# Patient Record
Sex: Male | Born: 1935
Health system: Southern US, Community
[De-identification: ages and names within clinical notes are randomized; demographics above are authoritative.]

## PROBLEM LIST (undated history)

## (undated) DIAGNOSIS — I350 Nonrheumatic aortic (valve) stenosis: Secondary | ICD-10-CM

## (undated) DIAGNOSIS — Z8719 Personal history of other diseases of the digestive system: Secondary | ICD-10-CM

## (undated) DIAGNOSIS — J189 Pneumonia, unspecified organism: Secondary | ICD-10-CM

## (undated) DIAGNOSIS — I739 Peripheral vascular disease, unspecified: Secondary | ICD-10-CM

## (undated) DIAGNOSIS — E119 Type 2 diabetes mellitus without complications: Secondary | ICD-10-CM

## (undated) DIAGNOSIS — J069 Acute upper respiratory infection, unspecified: Secondary | ICD-10-CM

## (undated) DIAGNOSIS — F32A Depression, unspecified: Secondary | ICD-10-CM

## (undated) DIAGNOSIS — T50905A Adverse effect of unspecified drugs, medicaments and biological substances, initial encounter: Secondary | ICD-10-CM

## (undated) DIAGNOSIS — Z8711 Personal history of peptic ulcer disease: Secondary | ICD-10-CM

## (undated) DIAGNOSIS — C679 Malignant neoplasm of bladder, unspecified: Secondary | ICD-10-CM

## (undated) DIAGNOSIS — I5032 Chronic diastolic (congestive) heart failure: Secondary | ICD-10-CM

## (undated) DIAGNOSIS — J449 Chronic obstructive pulmonary disease, unspecified: Secondary | ICD-10-CM

## (undated) DIAGNOSIS — Z95820 Peripheral vascular angioplasty status with implants and grafts: Secondary | ICD-10-CM

## (undated) DIAGNOSIS — L03116 Cellulitis of left lower limb: Secondary | ICD-10-CM

## (undated) DIAGNOSIS — I482 Chronic atrial fibrillation, unspecified: Secondary | ICD-10-CM

## (undated) DIAGNOSIS — IMO0001 Reserved for inherently not codable concepts without codable children: Secondary | ICD-10-CM

## (undated) DIAGNOSIS — E1340 Other specified diabetes mellitus with diabetic neuropathy, unspecified: Secondary | ICD-10-CM

## (undated) DIAGNOSIS — I701 Atherosclerosis of renal artery: Secondary | ICD-10-CM

## (undated) DIAGNOSIS — Z5189 Encounter for other specified aftercare: Secondary | ICD-10-CM

## (undated) DIAGNOSIS — R0602 Shortness of breath: Secondary | ICD-10-CM

## (undated) DIAGNOSIS — F329 Major depressive disorder, single episode, unspecified: Secondary | ICD-10-CM

## (undated) DIAGNOSIS — D6959 Other secondary thrombocytopenia: Secondary | ICD-10-CM

## (undated) DIAGNOSIS — I10 Essential (primary) hypertension: Secondary | ICD-10-CM

## (undated) HISTORY — DX: Atherosclerosis of renal artery: I70.1

## (undated) HISTORY — DX: Essential (primary) hypertension: I10

## (undated) HISTORY — PX: CATARACT EXTRACTION W/ INTRAOCULAR LENS  IMPLANT, BILATERAL: SHX1307

## (undated) HISTORY — DX: Nonrheumatic aortic (valve) stenosis: I35.0

## (undated) HISTORY — DX: Adverse effect of unspecified drugs, medicaments and biological substances, initial encounter: D69.59

## (undated) HISTORY — DX: Chronic diastolic (congestive) heart failure: I50.32

## (undated) HISTORY — DX: Cellulitis of left lower limb: L03.116

## (undated) HISTORY — PX: TONSILLECTOMY AND ADENOIDECTOMY: SUR1326

## (undated) HISTORY — PX: FRACTURE SURGERY: SHX138

## (undated) HISTORY — PX: PERIPHERAL ARTERIAL STENT GRAFT: SHX2220

## (undated) HISTORY — DX: Chronic atrial fibrillation, unspecified: I48.20

## (undated) HISTORY — PX: CHOLECYSTECTOMY: SHX55

## (undated) HISTORY — DX: Other secondary thrombocytopenia: T50.905A

## (undated) HISTORY — DX: Other specified diabetes mellitus with diabetic neuropathy, unspecified: E13.40

---

## 2000-03-17 ENCOUNTER — Encounter: Payer: Self-pay | Admitting: Specialist

## 2000-03-17 ENCOUNTER — Encounter: Admission: RE | Admit: 2000-03-17 | Discharge: 2000-03-17 | Payer: Self-pay | Admitting: Specialist

## 2001-06-29 ENCOUNTER — Ambulatory Visit (HOSPITAL_COMMUNITY): Admission: RE | Admit: 2001-06-29 | Discharge: 2001-06-29 | Payer: Self-pay | Admitting: Specialist

## 2001-07-13 ENCOUNTER — Ambulatory Visit (HOSPITAL_COMMUNITY): Admission: RE | Admit: 2001-07-13 | Discharge: 2001-07-13 | Payer: Self-pay | Admitting: Specialist

## 2001-10-30 ENCOUNTER — Ambulatory Visit (HOSPITAL_COMMUNITY): Admission: RE | Admit: 2001-10-30 | Discharge: 2001-10-30 | Payer: Self-pay | Admitting: Internal Medicine

## 2002-01-18 ENCOUNTER — Ambulatory Visit (HOSPITAL_COMMUNITY): Admission: RE | Admit: 2002-01-18 | Discharge: 2002-01-18 | Payer: Self-pay | Admitting: *Deleted

## 2002-02-04 ENCOUNTER — Ambulatory Visit (HOSPITAL_COMMUNITY): Admission: RE | Admit: 2002-02-04 | Discharge: 2002-02-04 | Payer: Self-pay | Admitting: *Deleted

## 2002-03-24 ENCOUNTER — Ambulatory Visit (HOSPITAL_COMMUNITY): Admission: RE | Admit: 2002-03-24 | Discharge: 2002-03-25 | Payer: Self-pay | Admitting: *Deleted

## 2002-04-21 ENCOUNTER — Ambulatory Visit (HOSPITAL_COMMUNITY): Admission: RE | Admit: 2002-04-21 | Discharge: 2002-04-22 | Payer: Self-pay | Admitting: *Deleted

## 2003-07-26 ENCOUNTER — Emergency Department (HOSPITAL_COMMUNITY): Admission: EM | Admit: 2003-07-26 | Discharge: 2003-07-26 | Payer: Self-pay | Admitting: *Deleted

## 2004-02-26 DIAGNOSIS — I701 Atherosclerosis of renal artery: Secondary | ICD-10-CM

## 2004-02-26 HISTORY — PX: RENAL ARTERY STENT: SHX2321

## 2004-02-26 HISTORY — DX: Atherosclerosis of renal artery: I70.1

## 2006-01-15 ENCOUNTER — Encounter: Admission: RE | Admit: 2006-01-15 | Discharge: 2006-01-15 | Payer: Self-pay | Admitting: Specialist

## 2006-02-23 ENCOUNTER — Ambulatory Visit: Payer: Self-pay | Admitting: Internal Medicine

## 2006-03-13 LAB — COMPREHENSIVE METABOLIC PANEL
ALT: 26 U/L (ref 0–53)
BUN: 24 mg/dL — ABNORMAL HIGH (ref 6–23)
CO2: 30 mEq/L (ref 19–32)
Calcium: 9.6 mg/dL (ref 8.4–10.5)
Creatinine, Ser: 1.17 mg/dL (ref 0.40–1.50)
Total Bilirubin: 0.8 mg/dL (ref 0.3–1.2)

## 2006-03-13 LAB — IRON AND TIBC
%SAT: 28 % (ref 20–55)
Iron: 63 ug/dL (ref 42–165)
TIBC: 222 ug/dL (ref 215–435)
UIBC: 159 ug/dL

## 2006-03-13 LAB — CBC WITH DIFFERENTIAL/PLATELET
BASO%: 0.8 % (ref 0.0–2.0)
Basophils Absolute: 0.1 10*3/uL (ref 0.0–0.1)
EOS%: 2.8 % (ref 0.0–7.0)
LYMPH%: 22.7 % (ref 14.0–48.0)
MCV: 95.4 fL (ref 81.6–98.0)
MONO#: 0.8 10*3/uL (ref 0.1–0.9)
NEUT#: 5.3 10*3/uL (ref 1.5–6.5)
NEUT%: 64.5 % (ref 40.0–75.0)
RBC: 4.17 10*6/uL — ABNORMAL LOW (ref 4.20–5.71)
RDW: 12.4 % (ref 11.2–14.6)
WBC: 8.3 10*3/uL (ref 4.0–10.0)

## 2006-03-13 LAB — LACTATE DEHYDROGENASE: LDH: 137 U/L (ref 94–250)

## 2006-03-18 ENCOUNTER — Ambulatory Visit (HOSPITAL_COMMUNITY): Admission: RE | Admit: 2006-03-18 | Discharge: 2006-03-18 | Payer: Self-pay | Admitting: Internal Medicine

## 2006-03-27 LAB — CBC WITH DIFFERENTIAL/PLATELET
BASO%: 0.5 % (ref 0.0–2.0)
EOS%: 2.1 % (ref 0.0–7.0)
LYMPH%: 15.3 % (ref 14.0–48.0)
MCH: 33.7 pg — ABNORMAL HIGH (ref 28.0–33.4)
MCHC: 34.7 g/dL (ref 32.0–35.9)
MCV: 97.3 fL (ref 81.6–98.0)
MONO%: 9.3 % (ref 0.0–13.0)
NEUT#: 6.3 10*3/uL (ref 1.5–6.5)
Platelets: 95 10*3/uL — ABNORMAL LOW (ref 145–400)
RBC: 4.06 10*6/uL — ABNORMAL LOW (ref 4.20–5.71)
RDW: 14.9 % — ABNORMAL HIGH (ref 11.2–14.6)

## 2006-08-07 ENCOUNTER — Ambulatory Visit: Payer: Self-pay | Admitting: Gastroenterology

## 2006-08-18 ENCOUNTER — Ambulatory Visit: Payer: Self-pay | Admitting: Gastroenterology

## 2006-08-20 ENCOUNTER — Ambulatory Visit: Payer: Self-pay | Admitting: Gastroenterology

## 2006-11-04 ENCOUNTER — Ambulatory Visit (HOSPITAL_COMMUNITY): Admission: RE | Admit: 2006-11-04 | Discharge: 2006-11-04 | Payer: Self-pay | Admitting: Internal Medicine

## 2007-07-11 DIAGNOSIS — N281 Cyst of kidney, acquired: Secondary | ICD-10-CM | POA: Insufficient documentation

## 2007-07-11 DIAGNOSIS — K5730 Diverticulosis of large intestine without perforation or abscess without bleeding: Secondary | ICD-10-CM | POA: Insufficient documentation

## 2007-07-11 DIAGNOSIS — I10 Essential (primary) hypertension: Secondary | ICD-10-CM

## 2007-07-11 DIAGNOSIS — E118 Type 2 diabetes mellitus with unspecified complications: Secondary | ICD-10-CM | POA: Insufficient documentation

## 2007-07-11 DIAGNOSIS — E785 Hyperlipidemia, unspecified: Secondary | ICD-10-CM | POA: Insufficient documentation

## 2007-07-11 DIAGNOSIS — G2581 Restless legs syndrome: Secondary | ICD-10-CM | POA: Insufficient documentation

## 2007-07-11 DIAGNOSIS — M129 Arthropathy, unspecified: Secondary | ICD-10-CM | POA: Insufficient documentation

## 2007-07-11 DIAGNOSIS — Z85828 Personal history of other malignant neoplasm of skin: Secondary | ICD-10-CM

## 2007-07-11 DIAGNOSIS — Z8679 Personal history of other diseases of the circulatory system: Secondary | ICD-10-CM

## 2007-07-11 DIAGNOSIS — F329 Major depressive disorder, single episode, unspecified: Secondary | ICD-10-CM

## 2007-07-11 DIAGNOSIS — I739 Peripheral vascular disease, unspecified: Secondary | ICD-10-CM

## 2007-08-10 ENCOUNTER — Encounter: Admission: RE | Admit: 2007-08-10 | Discharge: 2007-08-10 | Payer: Self-pay | Admitting: Internal Medicine

## 2007-11-26 ENCOUNTER — Encounter: Payer: Self-pay | Admitting: Cardiovascular Disease

## 2008-03-09 ENCOUNTER — Encounter: Payer: Self-pay | Admitting: Cardiovascular Disease

## 2008-05-18 ENCOUNTER — Ambulatory Visit: Payer: Self-pay | Admitting: Cardiovascular Disease

## 2008-05-26 ENCOUNTER — Telehealth (INDEPENDENT_AMBULATORY_CARE_PROVIDER_SITE_OTHER): Payer: Self-pay

## 2008-05-30 ENCOUNTER — Encounter: Payer: Self-pay | Admitting: Cardiovascular Disease

## 2008-05-30 ENCOUNTER — Ambulatory Visit: Payer: Self-pay

## 2008-05-30 ENCOUNTER — Encounter: Payer: Self-pay | Admitting: Cardiology

## 2008-05-31 DIAGNOSIS — R0989 Other specified symptoms and signs involving the circulatory and respiratory systems: Secondary | ICD-10-CM | POA: Insufficient documentation

## 2008-06-01 ENCOUNTER — Encounter: Payer: Self-pay | Admitting: Cardiovascular Disease

## 2008-06-01 ENCOUNTER — Ambulatory Visit: Payer: Self-pay | Admitting: Cardiovascular Disease

## 2008-06-13 ENCOUNTER — Encounter (INDEPENDENT_AMBULATORY_CARE_PROVIDER_SITE_OTHER): Payer: Self-pay | Admitting: Urology

## 2008-06-13 ENCOUNTER — Ambulatory Visit (HOSPITAL_COMMUNITY): Admission: RE | Admit: 2008-06-13 | Discharge: 2008-06-13 | Payer: Self-pay | Admitting: Urology

## 2008-10-12 ENCOUNTER — Ambulatory Visit: Payer: Self-pay | Admitting: Cardiovascular Disease

## 2008-10-19 ENCOUNTER — Ambulatory Visit: Payer: Self-pay

## 2008-10-19 ENCOUNTER — Encounter: Payer: Self-pay | Admitting: Cardiovascular Disease

## 2008-10-20 ENCOUNTER — Ambulatory Visit: Payer: Self-pay | Admitting: Cardiovascular Disease

## 2009-04-11 ENCOUNTER — Telehealth (INDEPENDENT_AMBULATORY_CARE_PROVIDER_SITE_OTHER): Payer: Self-pay | Admitting: *Deleted

## 2009-05-01 ENCOUNTER — Ambulatory Visit: Payer: Self-pay | Admitting: Cardiovascular Disease

## 2009-06-13 ENCOUNTER — Encounter: Payer: Self-pay | Admitting: Cardiovascular Disease

## 2009-06-26 ENCOUNTER — Encounter: Payer: Self-pay | Admitting: Cardiovascular Disease

## 2009-07-05 ENCOUNTER — Encounter: Payer: Self-pay | Admitting: Cardiovascular Disease

## 2009-07-05 ENCOUNTER — Ambulatory Visit: Payer: Self-pay

## 2010-02-09 ENCOUNTER — Ambulatory Visit: Payer: Self-pay | Admitting: Internal Medicine

## 2010-02-12 LAB — CBC WITH DIFFERENTIAL/PLATELET
Eosinophils Absolute: 0.2 10*3/uL (ref 0.0–0.5)
HCT: 39.1 % (ref 38.4–49.9)
LYMPH%: 19.8 % (ref 14.0–49.0)
MCHC: 34.4 g/dL (ref 32.0–36.0)
MCV: 95.8 fL (ref 79.3–98.0)
MONO#: 0.7 10*3/uL (ref 0.1–0.9)
MONO%: 8.5 % (ref 0.0–14.0)
NEUT%: 69.6 % (ref 39.0–75.0)
Platelets: 114 10*3/uL — ABNORMAL LOW (ref 140–400)
RBC: 4.08 10*6/uL — ABNORMAL LOW (ref 4.20–5.82)
WBC: 8.6 10*3/uL (ref 4.0–10.3)

## 2010-02-12 LAB — LACTATE DEHYDROGENASE: LDH: 134 U/L (ref 94–250)

## 2010-02-12 LAB — COMPREHENSIVE METABOLIC PANEL
Alkaline Phosphatase: 58 U/L (ref 39–117)
CO2: 29 mEq/L (ref 19–32)
Creatinine, Ser: 1.45 mg/dL (ref 0.40–1.50)
Glucose, Bld: 214 mg/dL — ABNORMAL HIGH (ref 70–99)
Sodium: 145 mEq/L (ref 135–145)
Total Bilirubin: 0.6 mg/dL (ref 0.3–1.2)

## 2010-03-29 NOTE — Miscellaneous (Signed)
Summary: Orders Update  Clinical Lists Changes  Orders: Added new Test order of Carotid Duplex (Carotid Duplex) - Signed 

## 2010-03-29 NOTE — Assessment & Plan Note (Signed)
Summary: f81m   Visit Type:  6 months follow up Referring Provider:  Maurine Cane, MD Primary Provider:  Creola Corn, MD  CC:  Bilateral leg pain.  History of Present Illness: 77 year-old male with lower extremity PAD presents for follow-up evaluation of lower extremity PAD and leg pain. He has known hx of lower extremity PAD and previous PTA/stenting of the bilateral SFA's.The patient has leg weakness, unsteadiness, and pain with ambulation. He does not have much calf pain. Instability and weakness are major complaints.  He had a lower extremity duplex scan 8/25 that showed ABI's of 0.5 bilaterally with multiple severe stenoses in both SFAs.  He continues to smoke cigarettes dating back to when he was very young.  Current Medications (verified): 1)  Kls Allerclear 10 Mg Tabs (Loratadine) .Marland Kitchen.. 1 Tab By Mouth Once Daily 2)  Amlodipine Besylate 10 Mg Tabs (Amlodipine Besylate) .Marland Kitchen.. 1 Tab By Mouth Once Daily 3)  Benazepril-Hydrochlorothiazide 20-12.5 Mg Tabs (Benazepril-Hydrochlorothiazide) .... 2 Tabs By Mouth Once Daily 4)  Celebrex 200 Mg Caps (Celecoxib) .Marland Kitchen.. 1 Tab As Needed 5)  Finasteride 5 Mg Tabs (Finasteride) .Marland Kitchen.. 1 Tab By Mouth Once Daily 6)  Glyburide-Metformin 5-500 Mg Tabs (Glyburide-Metformin) .... 1/2  Tab By Mouth Two Times A Day 7)  Klor-Con M20 20 Meq Cr-Tabs (Potassium Chloride Crys Cr) .Marland Kitchen.. 1 Tab By Mouth Once Daily 8)  Plavix 75 Mg Tabs (Clopidogrel Bisulfate) .Marland Kitchen.. 1 Tab By Mouth Qd 9)  Simvastatin 20 Mg Tabs (Simvastatin) .Marland Kitchen.. 1 Tab By Mouth Once Daily 10)  Doxazosin Mesylate 4 Mg Tabs (Doxazosin Mesylate) .... Take 1 Tablet By Mouth Once A Day 11)  Furosemide 20 Mg Tabs (Furosemide) .... Take One Tablet By Mouth Daily. 12)  Metoprolol Dose ? Marland Kitchen... Take 1 Tablet By Mouth Once A Day  Allergies: 1)  ! Jonne Ply  Past History:  Past medical history reviewed for relevance to current acute and chronic problems.  Past Medical History: Reviewed history from 10/20/2008 and no  changes required. Current Problems:  PVD (ICD-443.9) - PAD with hx of SFA stenting 2006 CAD (ICD-414.00)  HYPERTENSION (ICD-401.9) HYPERLIPIDEMIA (ICD-272.4) MURMUR (ICD-785.2) SKIN CANCER, HX OF (ICD-V10.83) RENAL CYST (ICD-593.2) DIVERTICULOSIS, COLON (ICD-562.10) RESTLESS LEGS SYNDROME (ICD-333.94) RHEUMATIC HEART DISEASE, HX OF (ICD-V12.59) DEPRESSION (ICD-311) ARTHRITIS (ICD-716.90) DIABETES MELLITUS, TYPE II (ICD-250.00) RENAL ARTERY STENOSIS, S/P RENAL PTA  Vital Signs:  Patient profile:   77 year old male Height:      66 inches Weight:      197.25 pounds Pulse rate:   56 / minute Pulse rhythm:   regular Resp:     18 per minute BP sitting:   144 / 64  (left arm) Cuff size:   large  Vitals Entered By: Vikki Ports (May 01, 2009 12:25 PM) CC: Bilateral leg pain Comments Pt. states Dr. Ty Hilts D/C Atenolol and prescibed Metoprolol isntead. Pt. does not remember med dose   Serial Vital Signs/Assessments:  Time      Position  BP       Pulse  Resp  Temp     By           R Arm     140/60                         Vikki Ports   Physical Exam  General:  Pt is alert and oriented, elderly male, in no acute distress. HEENT: normal Neck: normal carotid upstrokes with bilateral bruits, JVP  normal Lungs: CTA CV: RRR with 2/6 systolic murmur LSB Abd: soft, NT, positive BS, no bruit, no organomegaly Ext: no clubbing, cyanosis, or edema.  pedal pulses not palpable    Impression & Recommendations:  Problem # 1:  PVD (ICD-443.9) Pt has stable intermittent claudication. His leg problems are multifactorial. I reviewed the duplex ultrasound findings with him, and this shows diffuse bilateral SFA stenosis. Based on unfavorable leg anatomy, advanced age, chronic kidney disease, continued tobacco abuse, and the presence of other leg problems, this pt is better off with continued medical therapy for his PAD. We discussed this in detail today. He does not have resting limb  ischemia. I will plan on seeing him back on an as needed basis as he follows with Dr Eden Emms for his cardiac management. He was counseled regarding tobacco cessation, but he doesn't seem inclined to quit at this point.  Patient Instructions: 1)  Your physician recommends that you schedule a follow-up appointment as needed.  2)  Your physician recommends that you continue on your current medications as directed. Please refer to the Current Medication list given to you today.

## 2010-03-29 NOTE — Progress Notes (Signed)
  Phone Note From Other Clinic   Caller: Valerie/[facility]. Details for Reason: pt.Information Initial call taken by: Joice Lofts Doppler,Echo over to [phone] [facility]  April 11, 2009 2:05 PM

## 2010-04-03 ENCOUNTER — Other Ambulatory Visit: Payer: Self-pay | Admitting: Dermatology

## 2010-06-06 LAB — GLUCOSE, CAPILLARY: Glucose-Capillary: 88 mg/dL (ref 70–99)

## 2010-06-06 LAB — CBC
HCT: 38.7 % — ABNORMAL LOW (ref 39.0–52.0)
Hemoglobin: 13.1 g/dL (ref 13.0–17.0)
MCHC: 34 g/dL (ref 30.0–36.0)
MCV: 98.7 fL (ref 78.0–100.0)
Platelets: 116 10*3/uL — ABNORMAL LOW (ref 150–400)
RBC: 3.92 MIL/uL — ABNORMAL LOW (ref 4.22–5.81)
RDW: 14.5 % (ref 11.5–15.5)
WBC: 10.2 10*3/uL (ref 4.0–10.5)

## 2010-06-06 LAB — BASIC METABOLIC PANEL
BUN: 16 mg/dL (ref 6–23)
CO2: 31 mEq/L (ref 19–32)
Calcium: 9.9 mg/dL (ref 8.4–10.5)
Chloride: 106 mEq/L (ref 96–112)
Creatinine, Ser: 1.23 mg/dL (ref 0.4–1.5)
GFR calc Af Amer: >60 mL/min (ref >60)
GFR calc non Af Amer: 56 mL/min — ABNORMAL LOW (ref >60)
Glucose, Bld: 123 mg/dL — ABNORMAL HIGH (ref 70–99)
Potassium: 5.3 mEq/L — ABNORMAL HIGH (ref 3.5–5.1)
Sodium: 146 mEq/L — ABNORMAL HIGH (ref 135–145)

## 2010-07-10 NOTE — Assessment & Plan Note (Signed)
[facility]                         GASTROENTEROLOGY OFFICE NOTE   DAVARIS, Ronald                       MRN:          [id number]  DATE:08/07/2006                            DOB:          June 16, 1922    [name] is an 77 year old white male with severe coronary artery and  peripheral vascular disease. He has a strong family history of colon  cancer in his brother who died at age 29. He has had recurrent colon  polyps with the last colonoscopy 3 years ago, removable of multiple  edematous polyps. He is scheduled for followup exam at this time.   [name] denies any GI complaints except for some mild  constipation. He denies rectal bleeding, any upper GI, or any  hepatobiliary complaints. He apparently had an examination earlier this  year by Dr. Arbutus Ped for possible hepatosplenomegaly and underwent upper  abdominal ultrasonography which showed a borderline hepatomegaly,  gallbladder polyps, and a right renal minimally complex cyst. I really  do not have notes from that encounter.   [name] apparently has never had hepatitis or known liver disease.  He uses alcohol, amounts of which are unclear. He also smokes rather  heavily. I can not see in his chart where he has ever had abnormal liver  function tests, going back for many years.   PAST MEDICAL HISTORY:  Remarkable for adult onset diabetes, hypertensive  cardiovascular disease, peripheral vascular disease with peripheral  vascular stents by Dr. Madilyn Fireman and apparently cardiac stents by Dr.  Yevonne Pax group. In addition he suffers from degenerative arthritis  and chronic depression.   MEDICATIONS:  1. Actos 30 mg a day.  2. Benazepril 25 mg a day.  3. Celebrex 200 mg a day.  4. Doxazosin 8 mg  a day.  5. Finasteride 5 mg a day.  6. Glyburide 5/500 one and a half a day.  7. Potassium chloride 20 mEq a day.  8. Metaprel 100 mg a day.  9. Plavix 75 mg a day.  10.Simvastatin 20 mg  a day.  11.Vesicare 10 mg a day.  12.Mirapex at bedtime.   He apparently in the past has had reactions to salicylates.   FAMILY HISTORY:  Remarkable for his brother as mentioned above. His  mother had diabetes and arthrosclerotic peripheral vascular disease.   SOCIAL HISTORY:  The patient is married and lives with his wife and has  a Holiday representative. He works as a Firefighter. He does  smoke and uses ethanol socially.   REVIEW OF SYSTEMS:  Remarkably positive for multiple complaints  including peripheral edema, non productive cough, frequent urination,  arthralgias and myalgias, recurrent nose bleeds, severe fatigue,  deafness, shortness of breath exertion, etc.   He is an elderly but healthy appearing white male in no distress. He is  6 feet tall and weighs 189 pounds. Blood pressure is 140/68 and pulse  was 64 and regular.  He did appear to have spider telangiectasiaover his facial area.  CHEST: Generally showed diminished breath sounds but there were no  wheezes or rhonchi. He had a  very loud 2 to 3 over 6 systolic ejection  honking murmur at the base of the heart radiating into the carotid  areas. I could not appreciate an S3 gallop or irregularity.  There was no hepatosplenomegaly, abdominal masses, or tenderness on  exam. Bowel sounds were normal.  Mental status was clear.  There was no peripheral edema or phlebitis.  RECTAL EXAM: Deferred.   ASSESSMENT:  1. Family history of colon cancer at an early age in an 77 year old      white male with recurrent colon polyps. He needs follow up exam      despite his multiple comorbid medical problems and increased risk      of this procedure.  2. Peripheral and coronary artery vascular disease with stents      peripherally and in his coronary arteries, currently on Plavix      therapy.  3. Chronic obstructive lung disease from cigarette abuse.  4. Adult onset diabetes mellitus.  5. History of hyperlipidemia.  6.  History of what sounds like restless leg syndrome with Mirapex use.  7. History of possible occult liver disease from chronic ethanol use.   RECOMMENDATIONS:  I have gone ahead and set Mr. Handshoe up for  colonoscopy with close monitorization. I will not stop his Plavix but  will make adjustments in his diabetic medications. He is to continue all  of his other medications as listed above per doctors Timothy Lasso and  [place].     Vania Rea. Jarold Motto, MD, Caleen Essex, FAGA  Electronically Signed    DRP/MedQ  DD: 08/07/2006  DT: 08/07/2006  Job #: [id number]   cc:   [name], MD  Cassell Clement, M.D.

## 2010-07-10 NOTE — Op Note (Signed)
NAME:  Ronald, [name] NO.:  [id number]   MEDICAL RECORD NO.:  [id number]          PATIENT TYPE:  AMB   LOCATION:  DAY                          FACILITY:  [facility]   PHYSICIAN:  Mark C. Vernie Ammons, M.D.  DATE OF BIRTH:  04/23/1922   DATE OF PROCEDURE:  06/13/2008  DATE OF DISCHARGE:                               OPERATIVE REPORT   PREOPERATIVE DIAGNOSIS:  Bladder lesion - rule out bladder carcinoma.   POSTOPERATIVE DIAGNOSIS:  Bladder lesion - rule out bladder carcinoma.   PROCEDURE:  Cystoscopy with transurethral resection of bladder lesion  and random bladder biopsies.   SURGEON:  Raylene Miyamoto, M.D.   ANESTHESIA:  MAC.   SPECIMENS:  1. Cold cup biopsy of bladder lesion.  2. Cold cup biopsy of posterior wall of bladder.  3. Cold cup biopsy of left wall bladder.  (All sent to pathology)   BLOOD LOSS:  Minimal.   DRAINS:  None.   COMPLICATIONS:  None.   INDICATIONS:  The patient is an 77 year old male who was found to have  microscopic hematuria on a routine office visit.  His upper tracts were  evaluated with CT scan and no abnormality to account for this was  identified.  He underwent cystoscopy in my office and I found an area on  the posterior wall of the bladder on the right-hand side that was  worrisome for early transitional cell carcinoma of the bladder.  I  discussed the need to evaluate this further with biopsy and resection of  the area as well as random bladder biopsies with the patient.  He  understands and has elected to proceed.   DESCRIPTION OF OPERATION:  After informed consent, the patient was  brought to the major OR, placed on the table and administered  intravenous sedation.  I initially sounded his urethral meatus and found  it to be slightly narrow and unlikely to accept the 28-French  resectoscope so I dilated up to 28-French with the [employer] sounds.  I  then chose a 24-French resectoscope sheath and passed this with the  Timberlake obturator into the bladder.   The 20 degree lens was placed on the resectoscope element and then  inserted through the resectoscope sheath into the bladder.  I did a full  systematic inspection of the bladder and noted trabeculation.  The  ureteral orifices were noted to be of normal configuration and position.  The lesion that was slightly reddened and appeared to have an early  papillary configuration was identified again on the posterior right wall  of the bladder, well behind the right ureteral orifice.  There was some  slight erythematous change to the bladder wall just lateral to that as  well.  No other lesions were identified within the bladder or other  worrisome findings.   I initially started to resect the area but it was small enough that the  resection appeared to begin to obliterate the lesions so I stopped and  inserted the cold cup biopsy forceps and obtained a biopsy from that  area and also an area just to  the left of the lesion.  I then went on to  fulgurate this location completely and its surrounding mucosa as well as  lateral to that to include all of the slightly erythematous area of the  bladder wall.  I reinserted the cold cup biopsy forceps and then  obtained a biopsy from the posterior wall and left wall and fulgurated  these as well.  At the end of procedure, there was no evidence of  bleeding and the bladder was drained and the resectoscope was removed.  The patient did receive a B & O suppository preoperatively.  He was  taken to the recovery room in stable and satisfactory condition and  tolerated the procedure well with no intraoperative complications.   He will be given a prescription for Vicodin #18 and Pyridium 200 mg #24  and follow-up in my office in 1 week to discuss the results of his  pathology report.      Mark C. Vernie Ammons, M.D.  Electronically Signed     MCO/MEDQ  D:  06/13/2008  T:  06/13/2008  Job:  [id number]

## 2010-07-10 NOTE — Assessment & Plan Note (Signed)
[facility]                            CARDIOLOGY OFFICE NOTE   ZAYN, SELLEY                       MRN:          [id number]  DATE:05/18/2008                            DOB:          25-Dec-1922    An 77 year old patient referred for preop clearance.  Mr. Wieting is a  complicated patient.  Unfortunately, I do not have all of his records.  He was sent here by Dr. Retta Diones for preop clearance.  The patient may  have prostate cancer.  The patient has been seen by Dr. Ronny Flurry in  the past and it is not clear to me why he is now seeing [facility].  Talking to the patient, he actually minimizes a lot of  things.  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  He has an over 80-pack-year history of smoking and  continues to smoke 2 packs a day.  I do not have any PFTs on him.  He  does cough from time to time.  He tells me Dr. Timothy Lasso has done a chest x-  ray this past year, and at least so far, there has been no evidence of  cancer.  He denies  an active history of a diagnosis of COPD.  From a  cardiac perspective, he clearly has had some problems in the past, but  they do not appear to have been worked up recently.  He has not had a  recent stress test.  His coronary risk factors include heavy smoking,  diabetes, hypertension, hypercholesterolemia.   He is not having chest pain.  He walks on a regular basis.  He does not  appear to have claudication.   There is a distant history of some sort of murmur.  He belittles this.  The patient is on Plavix and it is not clear why, the patient certainly  does not know why.  He denies having a previous TIA or CVA.   The patient does drink alcohol.  He denies drinking every day but says  on most days, he has a beer or wine.  He does not think it would be a  problem to stop for a week or two.   He has seen Dr. Retta Diones and apparently had hematuria.  Followup  showed  that he needs a prostate surgery.   His review of systems is otherwise remarkable for some seasonal  allergies, constipation, fatigue.  He had a history of a stomach ulcer  in 1956, which is not active.  He has had urinary frequency, some sexual  dysfunction, arthritis.  His sugars have been fluctuating.  There is  also a question of previous claudication with stents in both legs and  kidneys.   His review of systems were all otherwise reviewed and negative.   FAMILY HISTORY:  Remarkable for mother dying at age 67 of an aneurysm  and father having a stroke at age 7.   ALLERGIES:  He is intolerant to ASPIRIN.   OTHER MEDICATIONS:  1. Actos 30 a day.  2. All clear allergy medicine which sounds like Claritin 10 mg a day.  3. Benazepril 25 mg a day.  4. Celebrex 200 mg a day.  5. Doxazosin 8 mg a day.  6. Finasteride 5 mg a day.  7. Glyburide 5/500 one and a half tablets in the morning and one at      night.  8. Klor-Con 20 a day.  9. Metoprolol 100 a day.  10.Plavix 75 a day.  11.Simvastatin 20 a day.  12.VESIcare 10 a day.  13.Mirapex.  14.Undefined dose of Lasix   The patient is widowed for about a year.  He has family in the area  including 3 children and 10 grandchildren.  He smokes 2 packs a day and  drinks fairly regularly.   PHYSICAL EXAMINATION:  GENERAL:  Remarkable for a ruddy complexion,  affect appropriate.  HEENT:  Unremarkable.  NECK:  Bilateral carotid bruits.  No lymphadenopathy, thyromegaly, or  JVP elevation.  LUNGS:  Decreased diaphragms with no active wheezing.  Lung fields  otherwise clear.  CARDIAC:  S1, second heart sound is muffled.  He has a severe AS murmur.  PMI is normal.  ABDOMEN:  Benign.  Bowel sounds are positive.  No AAA, no renal bruit.  No hepatosplenomegaly, hepatojugular reflux, or tenderness.  EXTREMITIES:  Distal pulses are decreased.  PTs are +1 bilaterally.  NEURO:  Nonfocal.  SKIN:  Warm and dry with some ecchymoses on  the right arm.  MUSCULOSKELETAL:  No muscular weakness.   EKG shows sinus rhythm with LVH, left axis deviation.   Notes from [facility] reviewed.  BUN 19, creatinine was 1.2.  We  will have to talk to Dr. Timothy Lasso and Dr. Patty Sermons and Dr. Retta Diones about  the patient and to review records from Dr. Patty Sermons and Dr. Timothy Lasso.   IMPRESSION:  1. Preoperative clearance.  The patient is at high risk for general      anesthesia.  He is a heavy smoker with undoubtedly chronic      obstructive pulmonary disease.  He drinks alcohol excessively.  He      may be at risk for DTs.  He may have severe aortic stenosis.  He      needs to have his risk of transient ischemic attack associated with      his carotid bruits found out and he may very well develop coronary      disease.  We will take the problems serially in regards to clearing      him for surgery, but in regards to general medical status, will      have to be done by Dr. Timothy Lasso.  2. Murmur.  Murmur sounds like severe aortic stenosis.  Check 2-D      echocardiogram.  3. Bilateral bruits.  Check carotid duplex.  This may be the reason      that he is on Plavix since he has an aspirin intolerance.  4. Rule out coronary artery disease, abnormal EKG and vascular path.      Check adenosine Myoview.  5. Question history of vascular disease with previous renal and SFA      stents.  We will try to check records from [facility].  He has      palpable pulses in the legs, and I do not think this would have      bearing necessarily on his prostate surgery.  6. Smoking cessation.  The patient is not interested in smoking.  He  has gotten away with it for all these years.  He does not want to      take Wellbutrin or Chantix.  This increases his risk of intubation      and pulmonary complications and also was probably the reason he has      such a bad vascular path.  7. Diabetes.  Follow with Dr. Timothy Lasso.  I do not have a recent      hemoglobin  A1c.  I suspect the Actos study he is on is responsible      for his lower extremity edema.  8. Lower extremity edema.  Check 2-D echocardiogram, assess right      ventricular and left ventricular function.  Decrease salt in the      diet.  Continue Lasix.  We will try to get a dose that he is      actually on.  Elevate the legs at the end of the day.  No evidence      of previous deep vein thrombosis.  9. Hypercholesterolemia.  Continue his simvastatin.  Lipid and liver      profile per Dr. Timothy Lasso.  10.Prostate cancer.  Follow up with Dr. Retta Diones.  I am not 100% sure      that I can clear him for surgery easily.     Noralyn Pick. Eden Emms, MD, [facility]  Electronically Signed    PCN/MedQ  DD: 05/18/2008  DT: 05/19/2008  Job #: [id number]   cc:   Bertram Millard. D[place]t, M.D.  [name], MD

## 2010-07-10 NOTE — Assessment & Plan Note (Signed)
[facility]                            CARDIOLOGY OFFICE NOTE   Ronald [name], Ronald [name]                       MRN:          [id number]  DATE:06/01/2008                            DOB:          February 05, 1923    Ronald [name] returns today for a followup.  Unfortunately, I have been unable to  contact Dr. Patty Sermons and Dr. Vernie Ammons.  I have tried to page them  multiple times.  I know that Dr. Patty Sermons is on vacation this week.  He  is the patient's previous cardiologist.  Dr. Vernie Ammons had planned on  doing a TURP on the patient this Monday.  However, Ronald [name] is at extremely  high risk for complications.  When I initially saw him, we had quite a  bit of digging to do.  I did talk to Dr. Timothy Lasso today since he is his  primary care doctor and knows Ronald [name] well.  He concurs with my concerns.  Essentially on examination, Ronald [name] is a vasculopath.  A 2-D echocardiogram  confirmed severe aortic stenosis with a mean gradient about 40 mmHg.  This was personally reviewed.  He has significant COPD and continues to  smoke.  In fact today in the office, he had his Camel cigarettes in his  front pocket.  He has over a 60 pack-year history of smoking Dr. Timothy Lasso  indicated he did not do formal PFTs, because this would not change his  management or his longevity, since Ronald [name] is already in his mid 63s.  The  combination of findings including the patient's age, smoking with  concomitant COPD, alcohol history, and severe aortic stenosis places him  at extremely high risk for general anesthesia and surgery.  I have been  trying to get a hold of Dr. Vernie Ammons, so I could ask him if there is any  other alternatives to general anesthesia and surgery.  As far as I can  tell from Ronald [name], this all started with a workup of asymptomatic  hematuria.   If there is a possibility of having transurethral seed implantation or  hormonal therapy, this would be preferable.   I will not clear Ronald [name] for surgery until  talking to Dr. Vernie Ammons.   I talked to Ronald [name] in detail today about all of his comorbidities.  He  seems to understand.  He also understands that I need to talk to Dr.  Vernie Ammons before seeing what his options are.   REVIEW OF SYSTEMS:  Otherwise negative.  He is currently not having  chest pain.  His carotid duplex fortunately did not show any high-grade  carotid stenoses, despite his bruits.  We did do a Myoview on the  patient to clear him for surgery and again fortunately, this did not  show any evidence of coronary artery disease, although I am sure, he has  some subclinical disease.   He is currently on:  1. Actos 30 a day.  2. Allergy medicine.  3. Benazepril 12.5 two tablets p.m.  4. Celebrex p.r.n.  5. Doxazosin 8 mg a day.  6. Finasteride 5 mg a day.  7. Glyburide 5/500  one and half in the morning and 1 at night.  8. Klor-Con 20 a day.  9. Metoprolol 100 a day.  10.Plavix 75 a day.  11.Simvastatin 20 a day.  12.VESIcare 10 a day.  13.Mirapex.   PHYSICAL EXAMINATION:  GENERAL:  Remarkable for an elderly male with  some rosacea-type redness to his face may be secondary to alcohol  intake.  VITAL SIGNS:  Weight 186, blood pressure 150/60, pulse 60 and regular,  respiratory rate 14, and afebrile.  HEENT:  Unremarkable.  NECK:  Carotids have parvus et tardus with a transmitted AS murmur and  bilateral carotid bruits.  No lymphadenopathy, thyromegaly, or JVP  elevation.  LUNGS:  Clear with no active wheezing.  CARDIAC:  S1.  Second heart sound is diminished.  Severe AS murmur.  PMI  is normal.  ABDOMEN:  Benign, bowel sounds positive.  No AAA.  No tenderness.  No  bruit.  No hepatosplenomegaly.  No hepatojugular reflux.  EXTREMITIES:  Bilateral femoral bruits with decreased peripheral pulses.  NEUROLOGIC:  Nonfocal.  SKIN:  Warm and dry.  MUSCULOSKELETAL:  No muscular weakness.   IMPRESSION:  1. Preoperative clearance.  I would prefer the patient not to have       general anesthesia, waiting for Dr. Vernie Ammons to call me back to see      if there is an alternative in regards to hormonal therapy or seed      implantation.  2. Carotid bruits.  No critical disease by duplex.  Continue aspirin      and Plavix.  3. Severe aortic stenosis, not really an operative candidate,      currently asymptomatic.  LV function is preserved.  4. History of peripheral vascular disease with questioned peripheral      stents and bilateral femoral bruits, currently not having      claudication.  We will have to see if Dr. Patty Sermons has records      regarding these procedures.  No non-healing ulcers.  5. Hypercholesteremia in the setting of vascular disease.  Continue      simvastatin.  6. Smoking cessation.  The patient is not motivated to quit.  Spent      less than 10 minutes counseling him today.  7. Hypertension.  Currently, well controlled.  Continue current dose      of ACE inhibitor, would not appear to have renal artery stenosis,      since his creatinine is stable.  8. Diabetes.  Follow up with Dr. Timothy Lasso.  Target hemoglobin A1c is less      than 6.5.  Continue oral hypoglycemics.     Ronald [name]. Eden Emms, MD, [facility]  Electronically Signed    PCN/MedQ  DD: 06/01/2008  DT: 06/02/2008  Job #: [id number]

## 2010-07-27 ENCOUNTER — Other Ambulatory Visit: Payer: Self-pay | Admitting: Cardiovascular Disease

## 2010-07-27 DIAGNOSIS — I6529 Occlusion and stenosis of unspecified carotid artery: Secondary | ICD-10-CM

## 2010-07-31 ENCOUNTER — Encounter: Payer: Self-pay | Admitting: *Deleted

## 2010-08-03 ENCOUNTER — Other Ambulatory Visit: Payer: Self-pay | Admitting: *Deleted

## 2010-08-03 ENCOUNTER — Encounter (INDEPENDENT_AMBULATORY_CARE_PROVIDER_SITE_OTHER): Payer: Medicare Other | Admitting: *Deleted

## 2010-08-03 DIAGNOSIS — I6529 Occlusion and stenosis of unspecified carotid artery: Secondary | ICD-10-CM

## 2010-08-07 ENCOUNTER — Encounter: Payer: Self-pay | Admitting: Cardiovascular Disease

## 2010-08-22 ENCOUNTER — Encounter: Payer: Medicare Other | Admitting: *Deleted

## 2010-10-01 ENCOUNTER — Other Ambulatory Visit (HOSPITAL_COMMUNITY): Payer: Self-pay | Admitting: Orthopedic Surgery

## 2010-10-01 DIAGNOSIS — D1800 Hemangioma unspecified site: Secondary | ICD-10-CM

## 2010-10-04 ENCOUNTER — Ambulatory Visit (HOSPITAL_COMMUNITY)
Admission: RE | Admit: 2010-10-04 | Discharge: 2010-10-04 | Disposition: A | Discharge status: Home / Self Care | Payer: Medicare Other | Source: Ambulatory Visit | Attending: Orthopedic Surgery | Admitting: Orthopedic Surgery

## 2010-10-04 DIAGNOSIS — M7989 Other specified soft tissue disorders: Secondary | ICD-10-CM | POA: Insufficient documentation

## 2010-10-04 DIAGNOSIS — D1800 Hemangioma unspecified site: Secondary | ICD-10-CM

## 2010-10-04 DIAGNOSIS — M79609 Pain in unspecified limb: Secondary | ICD-10-CM | POA: Insufficient documentation

## 2010-10-04 DIAGNOSIS — R29898 Other symptoms and signs involving the musculoskeletal system: Secondary | ICD-10-CM | POA: Insufficient documentation

## 2010-10-04 LAB — BUN: BUN: 22 mg/dL (ref 6–23)

## 2010-10-04 LAB — CREATININE, SERUM
Creatinine, Ser: 1.03 mg/dL (ref 0.50–1.35)
GFR calc Af Amer: >60 mL/min (ref >60)
GFR calc non Af Amer: >60 mL/min (ref >60)

## 2010-10-04 MED ORDER — GADOBENATE DIMEGLUMINE 529 MG/ML IV SOLN
17.0000 mL | Freq: Once | INTRAVENOUS | Status: AC
  Administered 2010-10-04: 17 mL via INTRAVENOUS

## 2010-10-18 ENCOUNTER — Encounter (HOSPITAL_BASED_OUTPATIENT_CLINIC_OR_DEPARTMENT_OTHER)
Admission: RE | Admit: 2010-10-18 | Discharge: 2010-10-18 | Disposition: A | Discharge status: Home / Self Care | Payer: Medicare Other | Source: Ambulatory Visit | Attending: Orthopedic Surgery | Admitting: Orthopedic Surgery

## 2010-10-18 DIAGNOSIS — Z01810 Encounter for preprocedural cardiovascular examination: Secondary | ICD-10-CM | POA: Insufficient documentation

## 2010-10-19 ENCOUNTER — Ambulatory Visit (HOSPITAL_BASED_OUTPATIENT_CLINIC_OR_DEPARTMENT_OTHER): Admission: RE | Admit: 2010-10-19 | Payer: Medicare Other | Source: Ambulatory Visit | Admitting: Orthopedic Surgery

## 2010-11-19 ENCOUNTER — Ambulatory Visit (HOSPITAL_COMMUNITY)
Admission: RE | Admit: 2010-11-19 | Discharge: 2010-11-20 | Disposition: A | Discharge status: Home / Self Care | Payer: Medicare Other | Source: Ambulatory Visit | Attending: Cardiovascular Disease | Admitting: Cardiovascular Disease

## 2010-11-19 DIAGNOSIS — I359 Nonrheumatic aortic valve disorder, unspecified: Secondary | ICD-10-CM | POA: Insufficient documentation

## 2010-11-19 DIAGNOSIS — I129 Hypertensive chronic kidney disease with stage 1 through stage 4 chronic kidney disease, or unspecified chronic kidney disease: Secondary | ICD-10-CM | POA: Insufficient documentation

## 2010-11-19 DIAGNOSIS — I701 Atherosclerosis of renal artery: Secondary | ICD-10-CM | POA: Insufficient documentation

## 2010-11-19 DIAGNOSIS — R0989 Other specified symptoms and signs involving the circulatory and respiratory systems: Secondary | ICD-10-CM | POA: Insufficient documentation

## 2010-11-19 DIAGNOSIS — I739 Peripheral vascular disease, unspecified: Secondary | ICD-10-CM | POA: Insufficient documentation

## 2010-11-19 DIAGNOSIS — R0609 Other forms of dyspnea: Secondary | ICD-10-CM | POA: Insufficient documentation

## 2010-11-19 DIAGNOSIS — N182 Chronic kidney disease, stage 2 (mild): Secondary | ICD-10-CM | POA: Insufficient documentation

## 2010-11-19 DIAGNOSIS — Y831 Surgical operation with implant of artificial internal device as the cause of abnormal reaction of the patient, or of later complication, without mention of misadventure at the time of the procedure: Secondary | ICD-10-CM | POA: Insufficient documentation

## 2010-11-19 DIAGNOSIS — E119 Type 2 diabetes mellitus without complications: Secondary | ICD-10-CM | POA: Insufficient documentation

## 2010-11-19 DIAGNOSIS — I4891 Unspecified atrial fibrillation: Secondary | ICD-10-CM | POA: Insufficient documentation

## 2010-11-19 DIAGNOSIS — F17200 Nicotine dependence, unspecified, uncomplicated: Secondary | ICD-10-CM | POA: Insufficient documentation

## 2010-11-19 DIAGNOSIS — T82898A Other specified complication of vascular prosthetic devices, implants and grafts, initial encounter: Secondary | ICD-10-CM | POA: Insufficient documentation

## 2010-11-19 HISTORY — PX: CARDIAC CATHETERIZATION: SHX172

## 2010-11-19 LAB — POCT I-STAT 3, VENOUS BLOOD GAS (G3P V)
Acid-Base Excess: 2 mmol/L (ref 0.0–2.0)
O2 Saturation: 66 %
pO2, Ven: 35 mmHg (ref 30.0–45.0)

## 2010-11-19 LAB — GLUCOSE, CAPILLARY
Glucose-Capillary: 137 mg/dL — ABNORMAL HIGH (ref 70–99)
Glucose-Capillary: 146 mg/dL — ABNORMAL HIGH (ref 70–99)

## 2010-11-20 LAB — CBC
HCT: 35 % — ABNORMAL LOW (ref 39.0–52.0)
Hemoglobin: 12.4 g/dL — ABNORMAL LOW (ref 13.0–17.0)
MCHC: 35.4 g/dL (ref 30.0–36.0)
RBC: 3.87 MIL/uL — ABNORMAL LOW (ref 4.22–5.81)
WBC: 11.7 10*3/uL — ABNORMAL HIGH (ref 4.0–10.5)

## 2010-11-20 LAB — BASIC METABOLIC PANEL
BUN: 20 mg/dL (ref 6–23)
Chloride: 103 mEq/L (ref 96–112)
GFR calc non Af Amer: >60 mL/min (ref >60)
Glucose, Bld: 145 mg/dL — ABNORMAL HIGH (ref 70–99)
Potassium: 3.5 mEq/L (ref 3.5–5.1)
Sodium: 138 mEq/L (ref 135–145)

## 2010-11-20 LAB — GLUCOSE, CAPILLARY: Glucose-Capillary: 243 mg/dL — ABNORMAL HIGH (ref 70–99)

## 2010-11-21 LAB — POCT I-STAT 3, ART BLOOD GAS (G3+)
Acid-Base Excess: 2 mmol/L (ref 0.0–2.0)
Bicarbonate: 26.7 mEq/L — ABNORMAL HIGH (ref 20.0–24.0)
pH, Arterial: 7.417 (ref 7.350–7.450)
pO2, Arterial: 67 mmHg — ABNORMAL LOW (ref 80.0–100.0)

## 2010-11-25 NOTE — Cardiovascular Report (Signed)
NAME:  Ronald [name], Ronald [name] NO.:  [id number]  MEDICAL RECORD NO.:  [id number]  LOCATION:  [facility]                         FACILITY:  [facility]  PHYSICIAN:  Ronald [name], M.D.   DATE OF BIRTH:  February 23, 1923  DATE OF PROCEDURE: DATE OF DISCHARGE:                           CARDIAC CATHETERIZATION   Ronald [name] is a delightful 77 year old mildly overweight, widowed Caucasian male, father of three children and one stepchild, grandfather of 10 grandchildren, who is a patient of Dr. Erin [name] as well as Dr. Ferd [name].  He has had new-onset AFib and was seen by Dr. Royann [name] for preoperative clearance before elective hand surgery by Dr. Teressa [name].  He has also had PVOD status post left renal artery PTA and stenting in the past as well as bilateral iliac artery PTA and stenting by Dr. Guinevere [name] in [place].  He does complain of claudication as well as dyspnea.  A 2-D echo in office revealed normal LV size with moderate concentric LVH and a valve area of 0.10 m2 with peak gradient of 63 mmHg.  Because of this, he presents now for right and left heart cath to further define his anatomy and physiology.  DESCRIPTION OF PROCEDURE:  The patient was brought to the [facility] in the postabsorptive state.  He was premedicated with p.o. Valium and received IV fentanyl.  His right groin was prepped and shaved in the usual sterile fashion.  Xylocaine 1% was used for local anesthesia.  A 6-French sheath was inserted into the right femoral artery using standard Seldinger technique.  A 7-French sheath was inserted into the left femoral vein.  A 7-French balloon-tip thermodilution Swan-Ganz catheter was used to obtain sequential right heart pressures, thickened thermodilution cardiac outputs with calculation of aortic valve gradient in the area.  The 6-French right and left Judkins diagnostic catheters were used for selective coronary angiography and a 6-French pigtail  catheter was used for abdominal aortography.  Visipaque dye was used for the entirety of the case. Retrograde aortic, left ventricular, and pullback pressures were recorded.  HEMODYNAMICS: 1. Aortic systolic pressure 144, diastolic pressure 58. 2. Left ventricular systolic pressure 188, end-diastolic pressure 18. 3. RA pressure A-wave 10, V-wave 10, mean 8. 4. RV pressure systolic 31 and diastolic pressure 8. 5. Pulmonary artery pressure A-wave 31, systolic 31, diastolic     pressure 14, mean 24. 6. Pulmonary capillary wedge pressure A-wave 19, V-wave 30, mean 20. 7. Cardiac output by Fick was 5.7 L r minute with an index of 2.8 L     per minute/m2. 8. Right thermodilution 4.8 L per minute with an index of 2.4 L per     minute/m2.  The aortic valve gradient measured by pullback was 44     mmHg.  The aortic valve area by Fick was 0.7 cm2 and by     thermodilution 0.89 cm2.  SELECTIVE CORONARY ANGIOGRAPHY: 1. Left main normal. 2. LAD normal. 3. Left circumflex normal. 4. Right coronary artery is dominant. Normal. 5. Left ventriculography was not performed. 6. Distal abdominal aortography; distal abdominal aortogram was     performed using 20 mL of Visipaque dye at 20 mL per second  x2.     There was a 75% "in-stent restenosis" within the left renal artery     stent.  The infrarenal abdominal aorta was free of systemic     atherosclerotic changes.  Both iliac artery stents widely patent     angiographically.  IMPRESSION:  Ronald [name] has normal coronaries by catheterization, moderate aortic stenosis, and a 75% left renal artery stenosis  He does have high V-waves on wedge.  He will be hydrated this evening, discharged home in the morning.  The sheaths were removed and pressure was held in the groin and achieved hemostasis.  The patient left the lab in stable condition.  I will see him back in the office and we will discuss aortic valve replacement versus continued medical  therapy.     Ronald [name], M.D.     JB/MEDQ  D:  11/19/2010  T:  11/19/2010  Job:  [id number]  cc:   Cardiac Catheterization Laboratory [facility] Ronald Pounds, MD  Electronically Signed by Ronald [name] M.D. on 11/25/2010 11:51:20 AM

## 2010-11-27 NOTE — Discharge Summary (Signed)
NAMEMarland Kitchen  Ronald [name], Ronald [name] NO.:  [id number]  MEDICAL RECORD NO.:  [id number]  LOCATION:  2501                         FACILITY:  [facility]  PHYSICIAN:  Ronald Fair, MD     DATE OF BIRTH:  [birth year]  DATE OF ADMISSION:  11/19/2010 DATE OF DISCHARGE:                              DISCHARGE SUMMARY   DISCHARGE DIAGNOSES: 1. Peripheral vascular disease, 75% in-stent restenosis in the left     renal artery, patent iliac stent. 2. Aortic stenosis, moderate, aortic valve area of 0.76 cm sq. 3. Normal coronaries by cath November 19, 2010. 4. Chronic kidney disease, stage II. 5. Atrial fibrillation with controlled ventricular rate. 6. Allergy to aspirin. 7. Chronic obstructive pulmonary disease. 8. History of tobacco use, continued. 9. Hypertension. 10.Diabetes mellitus.  HOSPITAL COURSE:  Ronald [name] is an 77 year old Caucasian male with a history of peripheral vascular disease.  Aortic stenosis, chronic kidney disease, atrial fibrillation, COPD, tobacco use, hypertension, diabetes mellitus, who presented for a left heart catheterization.  This was completed on November 19, 2010, this revealed normal coronary arteries with moderate aortic stenosis, high V-wave.  It also showed 75% in-stent restenosis in the left renal artery stent and patent bilateral common iliac stents.  Currently, the patient is doing well, ambulating in the hallway.  The patient has felt a little weak after being in bed for such a long period of time.  The patient will be discharged home after seen MD in stable condition and will follow up with Dr. Allyson [name] in approximately 1-2 weeks.  DISCHARGE LABS:  WBC 11.7, hemoglobin 12.5, hematocrit 35.0, platelet 98, potassium 3.5, chloride 103, carbon dioxide 27, BUN 20, creatinine 0.94, and glucose of 145, calcium 91.  STUDIES/PROCEDURES:  Cardiac catheterization, November 19, 2010. Hemodynamics, aortic systolic pressure 144, diastolic pressure 58.   Left ventricular systolic pressure 188, end-diastolic pressure 18, RA pressure A-wave 10, V-wave 10, mean 8.  RV pressure systolic 31, diastolic pressure 8.  Pulmonary artery pressure, A-wave 31, systolic 31, diastolic pressure of 14, mean is 24.  Pulmonary capillary wedge pressure, A-wave 19, V-wave 30, mean of 20.  Cardiac output by FICK was less than 5.7 liters per minute with an index of 2.8 liters per minute per meter squared.  Right thermodilution 4.8 liters per minute with an index of 2.4 liters per minute per meter squared.  Aortic valve gradient measured of pullback was 44 mmHg.  Aortic valve area by FICK was 0.7  cm sq and by thermodilution was 0.89 cm sq.  Selective coronary angiography showed left main was normal, LAD normal, circumflex was normal, right coronary artery was dominant and normal.  Left ventriculography was not performed.  Distal abdominal aortography showed 75% in-stent restenosis in the left renal artery stent.  Infrarenal abdominal aorta was free of systemic atherosclerotic change.  Both the iliac artery stents were widely patent.  DISCHARGE MEDICATIONS: 1. Benazepril 40 mg one tablet by mouth twice daily. 2. Hydrochlorothiazide 12.5 mg one tablet by mouth twice daily. 3. Metoprolol 100 mg one tablet by mouth daily. 4. Amlodipine 10 mg one tablet by mouth every evening. 5. Doxazosin 4 mg one tablet by mouth every evening. 6.  Finasteride 5 mg one tablet by mouth every evening. 7. Furosemide 20 mg one tablet by mouth every morning. 8. Glyburide and metformin 5/500 one tablet by mouth twice daily.  The     patient will restart this medication on September 27. 9. Klor-Con 20 mEq one tablet by mouth every evening. 10.Plavix 75 mg one tablet by mouth every evening. 11.Simvastatin 20 mg one tablet by mouth every evening.  DISPOSITION:  Ronald [name] will be discharged home in stable conditions. He is recommended to increase his activity slowly.  No lifting  or driving for 3 days.  He may recommend to eat a heart-healthy diet.  If cath site becomes red, painful, swollen, discharges fluid or pus, he is to call our office.  He will follow up with Dr. Allyson [name], October 15 on Monday at 10:15 a.m.    ______________________________ Ronald Finlay, PA   ______________________________ Ronald Fair, MD    BH/MEDQ  D:  11/20/2010  T:  11/20/2010  Job:  [id number]  Electronically Signed by Ronald Finlay PA on 11/21/2010 04:31:48 PM Electronically Signed by Ronald [name] M.D. on 11/27/2010 01:17:52 PM

## 2010-12-14 ENCOUNTER — Ambulatory Visit (HOSPITAL_COMMUNITY)
Admission: RE | Admit: 2010-12-14 | Discharge: 2010-12-14 | Disposition: A | Discharge status: Home / Self Care | Payer: Medicare Other | Source: Ambulatory Visit | Attending: Cardiothoracic Surgery | Admitting: Cardiothoracic Surgery

## 2010-12-14 ENCOUNTER — Encounter: Payer: Self-pay | Admitting: Cardiothoracic Surgery

## 2010-12-14 ENCOUNTER — Institutional Professional Consult (permissible substitution) (INDEPENDENT_AMBULATORY_CARE_PROVIDER_SITE_OTHER): Payer: BLUE CROSS/BLUE SHIELD | Admitting: Cardiothoracic Surgery

## 2010-12-14 ENCOUNTER — Other Ambulatory Visit: Payer: Self-pay | Admitting: Cardiothoracic Surgery

## 2010-12-14 DIAGNOSIS — I350 Nonrheumatic aortic (valve) stenosis: Secondary | ICD-10-CM | POA: Insufficient documentation

## 2010-12-14 DIAGNOSIS — R0989 Other specified symptoms and signs involving the circulatory and respiratory systems: Secondary | ICD-10-CM | POA: Insufficient documentation

## 2010-12-14 DIAGNOSIS — Z01811 Encounter for preprocedural respiratory examination: Secondary | ICD-10-CM | POA: Insufficient documentation

## 2010-12-14 DIAGNOSIS — I359 Nonrheumatic aortic valve disorder, unspecified: Secondary | ICD-10-CM | POA: Insufficient documentation

## 2010-12-14 DIAGNOSIS — E119 Type 2 diabetes mellitus without complications: Secondary | ICD-10-CM

## 2010-12-14 DIAGNOSIS — I10 Essential (primary) hypertension: Secondary | ICD-10-CM

## 2010-12-14 DIAGNOSIS — R0609 Other forms of dyspnea: Secondary | ICD-10-CM | POA: Insufficient documentation

## 2010-12-14 DIAGNOSIS — I059 Rheumatic mitral valve disease, unspecified: Secondary | ICD-10-CM

## 2010-12-14 NOTE — Progress Notes (Deleted)
[address]            [place] [address]          [phone]       Ronald [name] [place] Medical Record #[id number] Date of Birth: Nov 30, 1922  [name], MD [name], MD, MD  Chief Complaint:   No chief complaint on file.   History of Present Illness:       Past Medical History  Diagnosis Date  . AS (aortic stenosis)   . DM2 (diabetes mellitus, type 2)   . HTN (hypertension)   . Heart murmur   . Thrombocytopenia due to drugs     seen by [name] plts 114000 no rx  . Cancer     Bladder Cancer local  . Neuropathy due to secondary diabetes     Past Surgical History  Procedure Date  . Renal artery stent     2006 left renal stent  . Peripheral arterial stent graft     2006 left anf right illiac stents Dr Orson Slick    History  Smoking status  . Current Everyday Smoker -- 2.0 packs/day  . Types: Cigarettes  Smokeless tobacco  . Not on file    History  Alcohol Use  . Yes    2 beers a week    History   Social History  . Marital Status: Widowed    Spouse Name: N/A    Number of Children: 3  . Years of Education: N/A   Occupational History  . sales    Social History Main Topics  . Smoking status: Current Everyday Smoker -- 2.0 packs/day    Types: Cigarettes  . Smokeless tobacco: Not on file  . Alcohol Use: Yes     2 beers a week  . Drug Use: No  . Sexually Active: Not on file   Other Topics Concern  . Not on file   Social History Narrative  . No narrative on file    Allergies  Allergen Reactions  . Aspirin     Current Outpatient Prescriptions  Medication Sig Dispense Refill  . amLODipine (NORVASC) 10 MG tablet Take 10 mg by mouth daily.        . benazepril-hydrochlorthiazide (LOTENSIN HCT) 20-12.5 MG per tablet Take 1 tablet by mouth 2 (two) times daily.        . celecoxib (CELEBREX) 200 MG capsule Take 200 mg by mouth daily. PRN       . clopidogrel (PLAVIX) 75 MG tablet Take 75 mg by  mouth daily.        Marland Kitchen doxazosin (CARDURA) 4 MG tablet Take 4 mg by mouth at bedtime.        . finasteride (PROSCAR) 5 MG tablet Take 5 mg by mouth daily.        . furosemide (LASIX) 20 MG tablet Take 20 mg by mouth daily.        Marland Kitchen glyBURIDE-metformin (GLUCOVANCE) 5-500 MG per tablet Take 1 tablet by mouth 2 (two) times daily with a meal. 1/2 tablet po bid       . loratadine (CLARITIN) 10 MG tablet Take 10 mg by mouth daily.        . metoprolol (LOPRESSOR) 100 MG tablet Take 100 mg by mouth 1 day or 1 dose.        . potassium chloride SA (K-DUR,KLOR-CON) 20 MEQ  tablet Take 20 mEq by mouth daily.        . simvastatin (ZOCOR) 20 MG tablet Take 20 mg by mouth at bedtime.           Family History  Problem Relation Age of Onset  . Heart disease Mother   . Cancer Brother   . Stroke Father      Review of Systems:     Review of Systems - {ros master:310782}   Physical Exam: There were no vitals taken for this visit.  {Physical YNWG:9562130}   Diagnostic Studies & Laboratory data:         No results found.     Lab Results  Component Value Date   WBC 11.7* 11/20/2010   HGB 12.4* 11/20/2010   HCT 35.0* 11/20/2010   PLT 98* 11/20/2010   GLUCOSE 145* 11/20/2010   ALT 22 02/12/2010   AST 15 02/12/2010   NA 138 11/20/2010   K 3.5 11/20/2010   CL 103 11/20/2010   CREATININE 0.94 11/20/2010   BUN 20 11/20/2010   CO2 27 11/20/2010      Assessment / Plan:            [name] 12/14/2010    .risr

## 2010-12-14 NOTE — Progress Notes (Deleted)
                    [address]            [place] [address]          [phone]       Ronald [name] [place] Medical Record #[id number] Date of Birth: 07-14-1922  [name], MD No primary provider on file.  Chief Complaint:   No chief complaint on file.   History of Present Illness:       No past medical history on file.  No past surgical history on file.  History  Smoking status  . Not on file  Smokeless tobacco  . Not on file    History  Alcohol Use: Not on file    History   Social History  . Marital Status: Widowed    Spouse Name: N/A    Number of Children: N/A  . Years of Education: N/A   Occupational History  . Not on file.   Social History Main Topics  . Smoking status: Not on file  . Smokeless tobacco: Not on file  . Alcohol Use: Not on file  . Drug Use: Not on file  . Sexually Active: Not on file   Other Topics Concern  . Not on file   Social History Narrative  . No narrative on file    Allergies  Allergen Reactions  . Aspirin     No current outpatient prescriptions on file.     No family history on file.   Review of Systems:     ***  Physical Exam: There were no vitals taken for this visit.  {Physical OZHY:[id number]} ***  Diagnostic Studies & Laboratory data:          Lab Results  Component Value Date   WBC 11.7* 11/20/2010   HGB 12.4* 11/20/2010   HCT 35.0* 11/20/2010   PLT 98* 11/20/2010   GLUCOSE 145* 11/20/2010   ALT 22 02/12/2010   AST 15 02/12/2010   NA 138 11/20/2010   K 3.5 11/20/2010   CL 103 11/20/2010   CREATININE 0.94 11/20/2010   BUN 20 11/20/2010   CO2 27 11/20/2010   Assessment / Plan:            [name] 12/14/2010

## 2010-12-14 NOTE — Progress Notes (Signed)
[address]            [place] [address]          [phone]     Ronald [name] Date of Birth 05-04-1922 [facility] # [id number]   PCP is [name], MD, MD Referring Provider is [name], MD  Chief Complaint  Patient presents with  . Aortic Stenosis    Referral from Dr Allyson Sabal for surgical eval, AVR consult     HPI: Patient is a 77 year old [name] who is referred by Dr. York Ram for consideration of aortic valve replacement. The patient notes increasing fatigue with exertion weakness in his arms and legs some episodes of lightheadedness but no frank syncope. He does note some exertional shortness of breath with exertion but denies frank anginal symptoms. He has no nocturnal dyspnea. Denies pedal edema. He currently cares for himself drives and works on a daily basis. He does have known peripheral vascular disease oral agent controlled diabetes for 30 years and renal vascular disease with a stent in his left renal artery. He has been a long-term smoker and continues to smoke currently. He denies any frequent pneumonia or respiratory difficulty. He comes in today to discuss aortic valve replacement.  Past Medical History  Diagnosis Date  . AS (aortic stenosis)   . DM2 (diabetes mellitus, type 2)   . HTN (hypertension)   . Heart murmur   . Thrombocytopenia due to drugs     seen by [name] plts [id number] no rx  . Cancer     Bladder Cancer local  . Neuropathy due to secondary diabetes     Past Surgical History  Procedure Date  . Renal artery stent     2006 left renal stent  . Peripheral arterial stent graft     2006 left anf right illiac stents Dr Orson Slick    Family History  Problem Relation Age of Onset  . Heart disease Mother   . Cancer Brother   . 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 His wife expired 4 years ago  proximally year after having bypass surgery from rhythm complications. Patient currently lives alone and cares for himself.  History  Substance Use Topics  . Smoking status: Current Everyday Smoker -- 2.0 packs/day    Types: Cigarettes  . Smokeless tobacco: Not on file  . Alcohol Use: Yes     2 beers a week    Current Outpatient Prescriptions  Medication Sig Dispense Refill  . amLODipine (NORVASC) 10 MG tablet Take 10 mg by mouth daily.        . benazepril-hydrochlorthiazide (LOTENSIN HCT) 20-12.5 MG per tablet Take 1 tablet by mouth 2 (two) times daily.        . celecoxib (CELEBREX) 200 MG capsule Take 200 mg by mouth daily. PRN       . clopidogrel (PLAVIX) 75 MG tablet Take 75 mg by mouth daily.        Marland Kitchen doxazosin (CARDURA) 4 MG tablet Take 4 mg by mouth at bedtime.        . finasteride (PROSCAR) 5 MG tablet Take 5 mg by mouth daily.        . furosemide (LASIX) 20 MG tablet Take 20 mg by mouth daily.        Marland Kitchen  glyBURIDE-metformin (GLUCOVANCE) 5-500 MG per tablet Take 1 tablet by mouth 2 (two) times daily with a meal. 1/2 tablet po bid       . loratadine (CLARITIN) 10 MG tablet Take 10 mg by mouth daily.        . metoprolol (LOPRESSOR) 100 MG tablet Take 100 mg by mouth 1 day or 1 dose.        . potassium chloride SA (K-DUR,KLOR-CON) 20 MEQ tablet Take 20 mEq by mouth daily.        . simvastatin (ZOCOR) 20 MG tablet Take 20 mg by mouth at bedtime.          Allergies  Allergen Reactions  . Aspirin     Review of Systems  Constitutional: Positive for activity change and fatigue. Negative for chills, diaphoresis and appetite change.  HENT: Positive for neck pain.   Eyes: Negative.   Respiratory: Positive for shortness of breath. Negative for wheezing and stridor.   Cardiovascular: Positive for palpitations. Negative for chest pain and leg swelling.  Gastrointestinal: Negative.   Genitourinary: Positive for frequency. Negative for hematuria and difficulty urinating.    Musculoskeletal: Positive for back pain. Negative for arthralgias.  Neurological: Positive for dizziness, weakness, light-headedness and numbness. Negative for seizures and headaches.  Hematological: Negative for adenopathy. Does not bruise/bleed easily.  Psychiatric/Behavioral: Negative.      BP 108/69  Pulse 84  Resp 20  Ht 5\' 11"  (1.803 m)  Wt 180 lb (81.647 kg)  BMI 25.10 kg/m2  SpO2 98% Physical Exam  Constitutional: He is oriented to person, place, and time. He appears well-nourished. No distress.  HENT:  Mouth/Throat: No oropharyngeal exudate.  Eyes: No scleral icterus.  Neck: Neck supple. No JVD present. No tracheal deviation present. No thyromegaly present.  Cardiovascular: Normal rate, regular rhythm, S1 normal and S2 normal.  PMI is not displaced.  Exam reveals no gallop, no S3, no S4 and no friction rub.   Murmur heard.  Crescendo systolic murmur is present with a grade of 4/6   No diastolic murmur is present  Pulses:      Carotid pulses are 2+ on the right side, and 2+ on the left side.      Radial pulses are 2+ on the right side, and 2+ on the left side.       Femoral pulses are 2+ on the right side with bruit, and 2+ on the left side with bruit.      Popliteal pulses are 2+ on the right side, and 2+ on the left side.       Dorsalis pedis pulses are 0 on the right side, and 0 on the left side.       Posterior tibial pulses are 0 on the right side, and 0 on the left side.  Pulmonary/Chest: No stridor. No respiratory distress. He has no wheezes. He has no rales. He exhibits no tenderness.  Abdominal: Soft. Bowel sounds are normal.  Musculoskeletal: He exhibits no edema and no tenderness.  Lymphadenopathy:    He has no cervical adenopathy.  Neurological: He is alert and oriented to person, place, and time. No cranial nerve deficit.  Skin: Skin is warm and dry. He is not diaphoretic.       Venous statis changes both lower legs  Psychiatric: He has a normal mood and  affect. His behavior is normal. Judgment and thought content normal.     Diagnostic Tests: Lab Results  Component Value Date   WBC 11.7*  11/20/2010   HGB 12.4* 11/20/2010   HCT 35.0* 11/20/2010   PLT 98* 11/20/2010   GLUCOSE 145* 11/20/2010   ALT 22 02/12/2010   AST 15 02/12/2010   NA 138 11/20/2010   K 3.5 11/20/2010   CL 103 11/20/2010   CREATININE 0.94 11/20/2010   BUN 20 11/20/2010   CO2 27 11/20/2010   Cath 11/19/2010  HEMODYNAMICS:  1. Aortic systolic pressure 144, diastolic pressure Ronald.  2. Left ventricular systolic pressure 188, end-diastolic pressure 18.  3. RA pressure A-wave 10, V-wave 10, mean 8.  4. RV pressure systolic 31 and diastolic pressure 8.  5. Pulmonary artery pressure A-wave 31, systolic 31, diastolic  pressure 14, mean 24.  6. Pulmonary capillary wedge pressure A-wave 19, V-wave 30, mean 20.  7. Cardiac output by Fick was 5.7 L r minute with an index of 2.8 L  per minute/m2.  8. Right thermodilution 4.8 L per minute with an index of 2.4 L per  minute/m2. The aortic valve gradient measured by pullback was 44  mmHg. The aortic valve area by Fick was 0.7 cm2 and by  thermodilution 0.89 cm2.  SELECTIVE CORONARY ANGIOGRAPHY:  1. Left main normal.  2. LAD normal.  3. Left circumflex normal.  4. Right coronary artery is dominant. Normal.  5. Left ventriculography was not performed.  6. Distal abdominal aortography; distal abdominal aortogram was  performed using 20 mL of Visipaque dye at 20 mL per second x2.  There was a 75% "in-stent restenosis" within the left renal artery  stent. The infrarenal abdominal aorta was free of systemic  atherosclerotic changes. Both iliac artery stents widely patent  Angiographically.  Impression: #1 critical aortic stenosis symptomatic #2 peripheral vascular disease and renal vascular disease History of cerebrovascular disease with 40-60% bilateral carotid stenosis #3 history of diabetes with neuropathy #4 history of  thrombus cytopenia   Plan: I've discussed with the patient the diagnosis of critical aortic stenosis which appears to be becoming symptomatic. The risks of aortic valve replacement including death infection stroke myocardial infarction bleeding and blood transfusion were all discussed. I discussed specifically with the patient increased risk because of his age. However he remains very functional. Prior to making a final decision about proceeding with aortic valve replacement of made arrangements for him to have pulmonary function studies performed. The outcomes of treatment of critical aortic stenosis in symptomatic patients medically is discussed with the patient also. I discussed with them recent problems with TVAR. Both the pros and cons. At this point I would not deem the patient in an operable candidate from the standard approach of aortic valve replacement.  He is to have pulmonary function studies done later today I will plan to see him back early next week to further discuss treatment options.   [name] 12/14/2010

## 2010-12-14 NOTE — Progress Notes (Deleted)
[address]            [place] [address]          [phone]       Ronald [name] Ronald [name] #[id number] Date of Birth: 1922/07/06  Ronald Gess, MD Ronald Pounds, MD, MD  Chief Complaint:    Chief Complaint  Patient presents with  . Aortic Stenosis    Referral from Dr Ronald [name] for surgical eval, AVR consult     History of Present Illness:       Past Medical History  Diagnosis Date  . AS (aortic stenosis)   . DM2 (diabetes mellitus, type 2)   . HTN (hypertension)   . Heart murmur   . Thrombocytopenia due to drugs     seen by Dr Ronald [name] plts 114000 no rx  . Cancer     Bladder Cancer local  . Neuropathy due to secondary diabetes     Past Surgical History  Procedure Date  . Renal artery stent     2006 left renal stent  . Peripheral arterial stent graft     2006 left anf right illiac stents Dr Ronald [name]    History  Smoking status  . Current Everyday Smoker -- 2.0 packs/day  . Types: Cigarettes  Smokeless tobacco  . Not on file    History  Alcohol Use  . Yes    2 beers a week    History   Social History  . Marital Status: Widowed    Spouse Name: N/A    Number of Children: 3  . Years of Education: N/A   Occupational History  . sales    Social History Main Topics  . Smoking status: Current Everyday Smoker -- 2.0 packs/day    Types: Cigarettes  . Smokeless tobacco: Not on file  . Alcohol Use: Yes     2 beers a week  . Drug Use: No  . Sexually Active: Not on file   Other Topics Concern  . Not on file   Social History Narrative  . No narrative on file    Allergies  Allergen Reactions  . Aspirin     Current Outpatient Prescriptions  Medication Sig Dispense Refill  . amLODipine (NORVASC) 10 MG tablet Take 10 mg by mouth daily.        . benazepril-hydrochlorthiazide (LOTENSIN HCT) 20-12.5 MG per tablet Take 1 tablet by mouth 2 (two) times daily.        . celecoxib (CELEBREX) 200 MG  capsule Take 200 mg by mouth daily. PRN       . clopidogrel (PLAVIX) 75 MG tablet Take 75 mg by mouth daily.        Marland Kitchen doxazosin (CARDURA) 4 MG tablet Take 4 mg by mouth at bedtime.        . finasteride (PROSCAR) 5 MG tablet Take 5 mg by mouth daily.        . furosemide (LASIX) 20 MG tablet Take 20 mg by mouth daily.        Marland Kitchen glyBURIDE-metformin (GLUCOVANCE) 5-500 MG per tablet Take 1 tablet by mouth 2 (two) times daily with a meal. 1/2 tablet po bid       . loratadine (CLARITIN) 10 MG tablet Take 10 mg by mouth daily.        . metoprolol (LOPRESSOR) 100 MG tablet Take 100 mg by  mouth 1 day or 1 dose.        . potassium chloride SA (K-DUR,KLOR-CON) 20 MEQ tablet Take 20 mEq by mouth daily.        . simvastatin (ZOCOR) 20 MG tablet Take 20 mg by mouth at bedtime.           Family History  Problem Relation Age of Onset  . Heart disease Mother   . Cancer Brother   . Stroke Father      Review of Systems:     ***  Physical Exam: BP 108/69  Pulse 84  Resp 20  Ht 5\' 11"  (1.803 m)  Wt 180 lb (81.647 kg)  BMI 25.10 kg/m2  SpO2 98%     Diagnostic Studies & Laboratory data:          Lab Results  Component Value Date   WBC 11.7* 11/20/2010   HGB 12.4* 11/20/2010   HCT 35.0* 11/20/2010   PLT 98* 11/20/2010   GLUCOSE 145* 11/20/2010   ALT 22 02/12/2010   AST 15 02/12/2010   NA 138 11/20/2010   K 3.5 11/20/2010   CL 103 11/20/2010   CREATININE 0.94 11/20/2010   BUN 20 11/20/2010   CO2 27 11/20/2010   Assessment / Plan:            Ronald Ovens MD 12/14/2010

## 2010-12-14 NOTE — Patient Instructions (Signed)
Aortic Stenosis Aortic stenosis, or aortic valve stenosis, is a narrowing of the aortic valve. When the aortic valve is narrowed, the valve does not open and close very well. This restricts blood flow between the left side of the heart and the aorta (the large artery which takes blood to the rest of the body). This restriction makes it hard for your heart to pump blood. This extra work can weaken your heart and can lead to heart failure. CAUSES   Causes of aortic valve stenosis can vary. Some of these can include:  Calcium deposits on the aortic valve. Calcium can buildup on the aortic valve and make it stiff. This cause of aortic stenosis is most common in people over the age of 110.     Congenital heart defect. This can occur during the development of the fetus and can result in an aortic valve defect.     Rheumatic fever. Rheumatic fever is a bacterial infection that can develop from a strep throat infection. The bacteria from rheumatic fever can attach themselves to the valve. This can cause scarring on the aortic valve, causing it to become narrow.  SYMPTOMS   Symptoms of aortic valve stenosis develop when the valve disease is severe. Symptoms can include:  Shortness of breath, especially with physical activity.     Feeling tired (fatigue).     Chest pain (angina) or tightness.     Feeing your heart race or beat funny (heart palpitations).     Dizziness or fainting.  DIAGNOSIS   Aortic stenosis is diagnosed through:  A physical exam and symptoms.     A heart murmur.     Echocardiography. This test uses sound waves to produce images of your heart.  TREATMENT    Surgery is the treatment for aortic valve stenosis.     Surgery may not be needed right away. Surgery is necessary when narrowing of the aortic valve becomes severe, and symptoms develop or become worse.     Medications cannot reverse aortic valve stenosis.  HOME CARE INSTRUCTIONS    If you have aortic  stenosis, you many need to avoid strenuous physical activity. Talk with your caregiver about what types of activities you should avoid.     If you are a woman with aortic valve stenosis and are of child-bearing age, talk to your caregiver before you become pregnant.     If you become pregnant, you will need to be monitored by your obstetrician and cardiologist throughout your pregnancy, labor and delivery, and after delivery.  SEEK IMMEDIATE MEDICAL CARE IF:  You develop chest pain or tightness.     You develop shortness of breath or difficulty breathing.     You develop lightheadedness or fainting.     You have heart palpitations or skipped heartbeats.  Document Released: 11/10/2002 Document Revised: 10/24/2010 Document Reviewed: 06/20/2009 ExitCare Patient Information 2012 ExitCare, [facility]    Aortic Valve Replacement You have a disease of one of the valves of your heart. In you or your child's case, it is the aortic valve which needs replacing. Aortic valve replacement is open heart surgery done by a heart surgeon. This operation treats problems with the aortic valve. The aortic valve is the "outflow valve" for the left side of the heart. The left side of your heart (left ventricle) is the large muscular part of the heart that pumps blood to the rest of the body. It separates the left ventricle from the aorta. When the  heart squeezes down (contracts), the aortic valve is what keeps the blood from flowing back into the ventricle from the aorta. This allows the blood to keep moving through the body.   Surgery may be necessary when the valve does not open or close completely. A stenotic (narrow) valve does not let the blood leave the heart normally. This causes blood to back up in the left ventricle. This makes it hard for the heart to increase the amount of blood that it pumps. The heart has to work harder. This may produce shortness of breath and fatigue. Problems are worse with activity.   If  the valve leaflets do not meet correctly when closing, blood may leak backward into the ventricle each time the heart pumps. This is called aortic insufficiency. When some of the blood leaks backwards, the heart has to work even harder. The heart can allow for this over-work for a long time if the leakage came on slowly. Eventually, the heart fails.   Aortic valve problems may be caused by a birth defect. This is called congenital. Wear and tear can cause valves to fail. More commonly, rheumatic fever may damage the aortic valve. Occasionally, the valve may be damaged by infection. This also causes the aortic valve to leak.   DESCRIPTION OF SURGERY Aortic valves can be repaired. When the valve is too damaged to repair, the valve must be replaced. A prosthetic (artificial) valve is used to do this. Valves damaged by rheumatic disease often must be replaced.   Two types of artificial valves are available:  Mechanical valves made entirely from man-made materials.     Biological valves which are made from animal tissues or taken from a cadaver.  Each has advantages and disadvantages. The choice of which type to use should be made by you and your surgeon. Your risks, age, lifestyle, other medical problems including the decision on whether to be on blood thinners the rest of your life all will help you decide on which type of valve to use. There are a number of good MECHANICAL PROSTHESES available. All work well. The main advantage of mechanical valves is that they do not wear out. Their main disadvantage is that blood clots easier on mechanical valves. If this happens the valve will not work normally. Because of this, patients with mechanical valves must take anticoagulants (blood thinners) for life. There is also a small but definite risk of blood clots causing stroke, even when taking anticoagulants.   There are a number of BIOLOGICAL CHOICES for aortic valve replacement. Most are made from pig aortic  valves. Some are taken from cadavers. The main advantage is that they have a reduced risk of blood clots forming on the valve. This lessens the chance of the valve not working or causing a stroke. A large disadvantage of biological or tissue valves is that they wear out sooner than mechanical valves. The rate at which they wear out depends on the patient's age. A young boy might wear out such a valve in only a few years. The same valve might last 10 years in a middle aged person, and even longer in a patient over the age of 19. A tissue valve used in a person over 69 years old may never need replacement. RISKS AND COMPLICATIONS Your cardiologist and cardiothoracic surgeon can best determine your individual risk. It will depend on your age, general condition, medical conditions, and your heart function. In general, the risks include:  Problems from the operation itself  are low risk. Some common risks are:     Risks from the anesthesia.     Bleeding and infection.     Lifelong treatment with medications to prevent blood clots is needed for mechanical valve replacements.     Infection is more common with valve replacement than with valve repair.     Valve failure is more common with valve replacement than with valve repair. Pig valves tend to fail after about 8 to 10 years.  PROCEDURE   Valve repair or replacement is open-heart surgery. You are given general anesthesia (medications to help you sleep). You are then placed on a heart-lung machine. This machine provides oxygen to your blood while the heart is not working. The surgery generally lasts from 3 to 5 hours. During surgery, the surgeon makes a large incision (cut) in the chest. Sometimes the heart is cooled to slow or stop the heartbeat. The damaged aortic valve is either repaired or removed and replaced with an artificial heart valve. AFTER THE PROCEDURE  Recovery from heart valve surgery usually involves a few days in an intensive care unit  (ICU) of a hospital. Full recovery from heart valve surgery can take several months.     Anticoagulation (blood thinning) treatment with warfarin is often prescribed for 6 weeks to 3 months after surgery for those with biological valves. It is prescribed for life for those with mechanical valves.     Recovery includes healing of the surgical incision. There is a gradual building of stamina and exercise abilities. An exercise program under the direction of a physical therapist may be recommended.     Once you have an artificial valve, your heart function and your life will return to normal. You usually feel better after surgery. Shortness of breath and fatigue should lessen. If your heart was already severely damaged before your surgery, you may continue to have problems.     You can usually resume most of your normal activities. You will have to continue to monitor your condition. You need to watch out for blood clots and infections.     Artificial valves need to be replaced after a period of time. It is important that you see your caregiver regularly.     Some individuals with an aortic valve replacement need to take antibiotics before having dental work or other surgical procedures. This is called prophylactic antibiotic treatment. These drugs help to prevent infective endocarditis. Antibiotics are only recommended for individuals with the highest risk for developing infective endocarditis. Let your dentist and your caregiver know if you have a history of any of the following so that the necessary precautions can be taken:     A VSD.     A repaired VSD.     Endocarditis in the past.     An artificial (prosthetic) heart valve.  HOME CARE INSTRUCTIONS    Use all medications as prescribed.     Take your temperature every morning for the first week after surgery. Record these.     Weigh yourself every morning for at least the first week after surgery and record.     Do not lift more than  10 pounds (4.5 kg) until your sternum (breastbone) has healed. Avoid all activities which would place strain on your incision.     You may shower but do not take baths until instructed by your caregivers.     Avoid driving for 4 to 6 weeks following surgery or as instructed.  Use your elastic stockings during the day. You should wear the stockings for at least 2 weeks after discharge or longer if your ankles are swollen. The stockings help blood flow and help reduce swelling in the legs. It is easiest to put the stockings on before you get out of bed in the morning. They should fit snugly.  SEEK IMMEDIATE MEDICAL CARE IF:  You develop chest pain which is not coming from your incision (surgical cut) .     You develop shortness of breath.     You develop a temperature over 101 F (38.3 C).     You have a sudden weight gain. Let your caregiver know what the weight gain is.  Document Released: 07/03/2004 Document Revised: 10/24/2010 Document Reviewed: 02/08/2008 ExitCare Patient Information 2012 ExitCare, [facility]  Aortic Valve Replacement Care After Read the instructions outlined below and refer to this sheet for the next few weeks. These discharge instructions provide you with general information on caring for yourself after you leave the hospital. Your surgeon may also give you specific instructions. While your treatment has been planned according to the most current medical practices available, unavoidable complications occasionally occur. If you have any problems or questions after discharge, please call your surgeon. AFTER THE PROCEDURE  Full recovery from heart valve surgery can take several months.     Blood thinning (anticoagulation) treatment with warfarin is often prescribed for 6 weeks to 3 months after surgery for those with biological valves. It is prescribed for life for those with mechanical valves.     Recovery includes healing of the surgical incision. There is a gradual  building of stamina and exercise abilities. An exercise program under the direction of a physical therapist may be recommended.     Once you have an artificial valve, your heart function and your life will return to normal. You usually feel better after surgery. Shortness of breath and fatigue should lessen. If your heart was already severely damaged before your surgery, you may continue to have problems.     You can usually resume most of your normal activities. You will have to continue to monitor your condition. You need to watch out for blood clots and infections.     Artificial valves need to be replaced after a period of time. It is important that you see your caregiver regularly.     Some individuals with an aortic valve replacement need to take antibiotics before having dental work or other surgical procedures. This is called prophylactic antibiotic treatment. These drugs help to prevent infective endocarditis. Antibiotics are only recommended for individuals with the highest risk for developing infective endocarditis. Let your dentist and your caregiver know if you have a history of any of the following so that the necessary precautions can be taken:     A VSD.     A repaired VSD.     Endocarditis in the past.     An artificial (prosthetic) heart valve.  HOME CARE INSTRUCTIONS    Use all medications as prescribed.     Take your temperature every morning for the first week after surgery. Record these.     Weigh yourself every morning for at least the first week after surgery and record.     Do not lift more than 10 pounds (4.5 kg) until your breastbone (sternum) has healed. Avoid all activities which would place strain on your incision.     You may shower as soon as directed by your caregiver after  surgery. Pat incisions dry. Do not rub incisions with washcloth or towel.     Avoid driving for 4 to 6 weeks following surgery or as instructed.     Use your elastic stockings during  the day. You should wear the stockings for at least 2 weeks after discharge or longer if your ankles are swollen. The stockings help blood flow and help reduce swelling in the legs. It is easiest to put the stockings on before you get out of bed in the morning. They should fit snugly.  Pain Control  If a prescription was given for a pain reliever, please follow your doctor's directions.     If the pain is not relieved by your medicine, becomes worse, or you have difficulty breathing, call your surgeon.  Activity  Take frequent rest periods throughout the day.     Wait one week before returning to strenuous activities such as heavy lifting (more than 10 pounds), pushing or pulling.     Talk with your doctor about when you may return to work and your exercise routine.     Do not drive while taking prescription pain medication.  Nutrition  You may resume your normal diet.     Drink plenty of fluids (6-8 glasses a day).     Eat a well-balanced diet.     Call your caregiver for persistent nausea or vomiting.  Elimination Your normal bowel function should return. If constipation should occur, you may:  Take a mild laxative.     Add fruit and bran to your diet.     Drink more fluids.     Call your doctor if constipation is not relieved.  SEEK IMMEDIATE MEDICAL CARE IF:    You develop chest pain which is not coming from your surgical cut (incision).     You develop shortness of breath or have difficulty breathing.     You develop a temperature over 101 F (38.3 C).     You have a sudden weight gain. Let your caregiver know what the weight gain is.     You develop a rash.     You develop any reaction or side effects to medications given.     You have increased bleeding from wounds.     You see redness, swelling, or have increasing pain in wounds.     You have pus coming from your wound.     You develop lightheadedness or feel faint.  Document Released: 08/30/2004  Document Revised: 10/24/2010 Document Reviewed: 11/21/2004 [place] [facility] Patient Information 2012 [place], [place].

## 2010-12-17 ENCOUNTER — Encounter: Payer: Medicare Other | Admitting: Cardiothoracic Surgery

## 2010-12-18 ENCOUNTER — Encounter: Payer: Self-pay | Admitting: Cardiothoracic Surgery

## 2010-12-18 ENCOUNTER — Ambulatory Visit (INDEPENDENT_AMBULATORY_CARE_PROVIDER_SITE_OTHER): Payer: BLUE CROSS/BLUE SHIELD | Admitting: Cardiothoracic Surgery

## 2010-12-18 VITALS — BP 109/48 | HR 72 | Resp 20 | Ht 71.0 in | Wt 180.0 lb

## 2010-12-18 DIAGNOSIS — I350 Nonrheumatic aortic (valve) stenosis: Secondary | ICD-10-CM

## 2010-12-18 DIAGNOSIS — I359 Nonrheumatic aortic valve disorder, unspecified: Secondary | ICD-10-CM

## 2010-12-18 NOTE — Patient Instructions (Signed)
Stop Celebrex Stop Plavix Call Dee [phone] to arrange date of surgery   Aortic Valve Replacement You have a disease of one of the valves of your heart. In you or your child's case, it is the aortic valve which needs replacing. Aortic valve replacement is open heart surgery done by a heart surgeon. This operation treats problems with the aortic valve. The aortic valve is the "outflow valve" for the left side of the heart. The left side of your heart (left ventricle) is the large muscular part of the heart that pumps blood to the rest of the body. It separates the left ventricle from the aorta. When the heart squeezes down (contracts), the aortic valve is what keeps the blood from flowing back into the ventricle from the aorta. This allows the blood to keep moving through the body.  Surgery may be necessary when the valve does not open or close completely. A stenotic (narrow) valve does not let the blood leave the heart normally. This causes blood to back up in the left ventricle. This makes it hard for the heart to increase the amount of blood that it pumps. The heart has to work harder. This may produce shortness of breath and fatigue. Problems are worse with activity.  If the valve leaflets do not meet correctly when closing, blood may leak backward into the ventricle each time the heart pumps. This is called aortic insufficiency. When some of the blood leaks backwards, the heart has to work even harder. The heart can allow for this over-work for a long time if the leakage came on slowly. Eventually, the heart fails.  Aortic valve problems may be caused by a birth defect. This is called congenital. Wear and tear can cause valves to fail. More commonly, rheumatic fever may damage the aortic valve. Occasionally, the valve may be damaged by infection. This also causes the aortic valve to leak.  DESCRIPTION OF SURGERY Aortic valves can be repaired. When the valve is too damaged to repair, the valve must be  replaced. A prosthetic (artificial) valve is used to do this. Valves damaged by rheumatic disease often must be replaced.  Two types of artificial valves are available:  Mechanical valves made entirely from man-made materials.   Biological valves which are made from animal tissues or taken from a cadaver.  Each has advantages and disadvantages. The choice of which type to use should be made by you and your surgeon. Your risks, age, lifestyle, other medical problems including the decision on whether to be on blood thinners the rest of your life all will help you decide on which type of valve to use. There are a number of good MECHANICAL PROSTHESES available. All work well. The main advantage of mechanical valves is that they do not wear out. Their main disadvantage is that blood clots easier on mechanical valves. If this happens the valve will not work normally. Because of this, patients with mechanical valves must take anticoagulants (blood thinners) for life. There is also a small but definite risk of blood clots causing stroke, even when taking anticoagulants.  There are a number of BIOLOGICAL CHOICES for aortic valve replacement. Most are made from pig aortic valves. Some are taken from cadavers. The main advantage is that they have a reduced risk of blood clots forming on the valve. This lessens the chance of the valve not working or causing a stroke. A large disadvantage of biological or tissue valves is that they wear out sooner than mechanical valves.  The rate at which they wear out depends on the patient's age. A young boy might wear out such a valve in only a few years. The same valve might last 10 years in a middle aged person, and even longer in a patient over the age of 68. A tissue valve used in a person over 64 years old may never need replacement. RISKS AND COMPLICATIONS Your cardiologist and cardiothoracic surgeon can best determine your individual risk. It will depend on your age, general  condition, medical conditions, and your heart function. In general, the risks include:  Problems from the operation itself are low risk. Some common risks are:   Risks from the anesthesia.   Bleeding and infection.   Lifelong treatment with medications to prevent blood clots is needed for mechanical valve replacements.   Infection is more common with valve replacement than with valve repair.   Valve failure is more common with valve replacement than with valve repair. Pig valves tend to fail after about 8 to 10 years.  PROCEDURE  Valve repair or replacement is open-heart surgery. You are given general anesthesia (medications to help you sleep). You are then placed on a heart-lung machine. This machine provides oxygen to your blood while the heart is not working. The surgery generally lasts from 3 to 5 hours. During surgery, the surgeon makes a large incision (cut) in the chest. Sometimes the heart is cooled to slow or stop the heartbeat. The damaged aortic valve is either repaired or removed and replaced with an artificial heart valve. AFTER THE PROCEDURE  Recovery from heart valve surgery usually involves a few days in an intensive care unit (ICU) of a hospital. Full recovery from heart valve surgery can take several months.   Anticoagulation (blood thinning) treatment with warfarin is often prescribed for 6 weeks to 3 months after surgery for those with biological valves. It is prescribed for life for those with mechanical valves.   Recovery includes healing of the surgical incision. There is a gradual building of stamina and exercise abilities. An exercise program under the direction of a physical therapist may be recommended.   Once you have an artificial valve, your heart function and your life will return to normal. You usually feel better after surgery. Shortness of breath and fatigue should lessen. If your heart was already severely damaged before your surgery, you may continue to have  problems.   You can usually resume most of your normal activities. You will have to continue to monitor your condition. You need to watch out for blood clots and infections.   Artificial valves need to be replaced after a period of time. It is important that you see your caregiver regularly.   Some individuals with an aortic valve replacement need to take antibiotics before having dental work or other surgical procedures. This is called prophylactic antibiotic treatment. These drugs help to prevent infective endocarditis. Antibiotics are only recommended for individuals with the highest risk for developing infective endocarditis. Let your dentist and your caregiver know if you have a history of any of the following so that the necessary precautions can be taken:   A VSD.   A repaired VSD.   Endocarditis in the past.   An artificial (prosthetic) heart valve.  HOME CARE INSTRUCTIONS   Use all medications as prescribed.   Take your temperature every morning for the first week after surgery. Record these.   Weigh yourself every morning for at least the first week after surgery and  record.   Do not lift more than 10 pounds (4.5 kg) until your sternum (breastbone) has healed. Avoid all activities which would place strain on your incision.   You may shower but do not take baths until instructed by your caregivers.   Avoid driving for 4 to 6 weeks following surgery or as instructed.   Use your elastic stockings during the day. You should wear the stockings for at least 2 weeks after discharge or longer if your ankles are swollen. The stockings help blood flow and help reduce swelling in the legs. It is easiest to put the stockings on before you get out of bed in the morning. They should fit snugly.  SEEK IMMEDIATE MEDICAL CARE IF:  You develop chest pain which is not coming from your incision (surgical cut) .   You develop shortness of breath.   You develop a temperature over 101 F  (38.3 C).   You have a sudden weight gain. Let your caregiver know what the weight gain is.  Document Released: 07/03/2004 Document Revised: 10/24/2010 Document Reviewed: 02/08/2008 [facility] Patient Information 2012 [place], [place].

## 2010-12-18 NOTE — Progress Notes (Signed)
[address]            [place] [address]          [phone]       [name] [facility] Medical Record #[id number] Date of Birth: [birth year]  [name], MD [name], MD, MD  Chief Complaint:    Chief Complaint  Patient presents with  . Follow-up    F/U to discuss PFT'S, Aortic Stenosis    History of Present Illness:        Current Activity/ Functional Status: Patient is independent with mobility/ambulation, transfers, ADL's, IADL's.   Past Medical History  Diagnosis Date  . AS (aortic stenosis)   . DM2 (diabetes mellitus, type 2)   . HTN (hypertension)   . Heart murmur   . Thrombocytopenia due to drugs     seen by [name] plts 114000 no rx  . Cancer     Bladder Cancer local  . Neuropathy due to secondary diabetes     Past Surgical History  Procedure Date  . Renal artery stent     2006 left renal stent  . Peripheral arterial stent graft     2006 left anf right illiac stents Dr Orson Slick    History  Smoking status  . Current Everyday Smoker -- 2.0 packs/day  . Types: Cigarettes  Smokeless tobacco  . Not on file    History  Alcohol Use  . Yes    2 beers a week    History   Social History  . Marital Status: Widowed    Spouse Name: N/A    Number of Children: 3  . Years of Education: N/A   Occupational History  . sales    Social History Main Topics  . Smoking status: Current Everyday Smoker -- 2.0 packs/day    Types: Cigarettes  . Smokeless tobacco: Not on file  . Alcohol Use: Yes     2 beers a week  . Drug Use: No  . Sexually Active: Not on file   Other Topics Concern  . Not on file   Social History Narrative  . No narrative on file    Allergies  Allergen Reactions  . Aspirin     Current Outpatient Prescriptions  Medication Sig Dispense Refill  . amLODipine (NORVASC) 10 MG tablet Take 10 mg by mouth daily.        . benazepril-hydrochlorthiazide (LOTENSIN HCT) 20-12.5 MG  per tablet Take 1 tablet by mouth 2 (two) times daily.        Marland Kitchen doxazosin (CARDURA) 4 MG tablet Take 4 mg by mouth at bedtime.        . finasteride (PROSCAR) 5 MG tablet Take 5 mg by mouth daily.        . furosemide (LASIX) 20 MG tablet Take 20 mg by mouth daily.        Marland Kitchen glyBURIDE-metformin (GLUCOVANCE) 5-500 MG per tablet Take 1 tablet by mouth 2 (two) times daily with a meal. 1/2 tablet po bid       . loratadine (CLARITIN) 10 MG tablet Take 10 mg by mouth daily.        . metoprolol (LOPRESSOR) 100 MG tablet Take 100 mg by mouth 1 day or 1 dose.        . potassium chloride SA (K-DUR,KLOR-CON) 20 MEQ tablet Take 20 mEq by mouth daily.        Marland Kitchen  simvastatin (ZOCOR) 20 MG tablet Take 20 mg by mouth at bedtime.           Family History  Problem Relation Age of Onset  . Heart disease Mother   . Cancer Brother   . Stroke Father       Physical Exam: BP 109/48  Pulse 72  Resp 20  Ht 5\' 11"  (1.803 m)  Wt 180 lb (81.647 kg)  BMI 25.10 kg/m2  SpO2 98%  General appearance: alert, cooperative, flushed and no distress Neurologic: intact Heart: normal apical impulse, prominent apical impulse, systolic murmur: holosystolic 4/6, crescendo and harsh throughout the precordium, radiates to carotids and no rub Lungs: clear to auscultation bilaterally and normal percussion bilaterally Abdomen: soft, non-tender; bowel sounds normal; no masses,  no organomegaly Extremities: extremities normal, atraumatic, no cyanosis or edema, Homans sign is negative, no sign of DVT, no edema, redness or tenderness in the calves or thighs and venous stasis dermatitis noted   Diagnostic Studies & Laboratory data:     Recent Radiology Findings:   No results found.    Recent Lab Findings: Lab Results  Component Value Date   WBC 11.7* 11/20/2010   HGB 12.4* 11/20/2010   HCT 35.0* 11/20/2010   PLT 98* 11/20/2010   GLUCOSE 145* 11/20/2010   ALT 22 02/12/2010   AST 15 02/12/2010   NA 138 11/20/2010   K 3.5  11/20/2010   CL 103 11/20/2010   CREATININE 0.94 11/20/2010   BUN 20 11/20/2010   CO2 27 11/20/2010   PFTs were performed which demonstrated an FEV1 of 1.9 672% of predicted with mild obstructive airway disease   Assessment / Plan:     Since then the patient last week he returns today to further discuss surgery, again the risks and options have been reviewed with him. He wishes to proceed. We will discontinue his Celebrex and Plavix today. Alternatively plan to proceed with aortic valve replacement November 12.  The goals risks and alternatives of the planned surgical procedureAVR have been discussed with the patient in detail. The risks of the procedure including death, infection, stroke, myocardial infarction, bleeding, blood transfusion have all been discussed specifically.  I have quoted Ronald [name] a 5% of perioperative mortality and a complication rate as high as  25 %. The patient's questions have been answered.Ronald [name] is willing  to proceed with the planned procedure.       [name] 12/18/2010 5:39 PM

## 2010-12-19 NOTE — Progress Notes (Signed)
This encounter was created in error - please disregard.

## 2010-12-24 ENCOUNTER — Other Ambulatory Visit: Payer: Self-pay | Admitting: Cardiothoracic Surgery

## 2010-12-24 DIAGNOSIS — I359 Nonrheumatic aortic valve disorder, unspecified: Secondary | ICD-10-CM

## 2011-01-02 ENCOUNTER — Other Ambulatory Visit: Payer: Self-pay | Admitting: Cardiothoracic Surgery

## 2011-01-02 NOTE — H&P (Signed)
                  [address]            [place] [address]          [phone]       Ronald [name] [facility] Medical Record #[id number] Date of Birth: 11/09/1922  Referring: Dr Berry Primary Care: RUSSO,JOHN M,    Chief Complaint   Patient presents with   .  Aortic Stenosis     Referral from Dr Berry for surgical eval, AVR consult   HPI:  Patient is a 77-year-old male who is referred by Dr. Jonathan Barry for consideration of aortic valve replacement. The patient notes increasing fatigue with exertion weakness in his arms and legs some episodes of lightheadedness but no frank syncope. He does note some exertional shortness of breath with exertion but denies frank anginal symptoms. He has no nocturnal dyspnea. Denies pedal edema. He currently cares for himself drives and works on a daily basis. He does have known peripheral vascular disease oral agent controlled diabetes for 30 years and renal vascular disease with a stent in his left renal artery. He has been a long-term smoker and continues to smoke currently. He denies any frequent pneumonia or respiratory difficulty. He comes in today to discuss aortic valve replacement.    Past Medical History   Diagnosis  Date   .  AS (aortic stenosis)    .  DM2 (diabetes mellitus, type 2)    .  HTN (hypertension)    .  Heart murmur    .  Thrombocytopenia due to drugs      seen by Dr Mohammad plts 114000 no rx   .  Cancer      Bladder Cancer local   .  Neuropathy due to secondary diabetes     Past Surgical History   Procedure  Date   .  Renal artery stent      2006 left renal stent   .  Peripheral arterial stent graft      2006 left anf right illiac stents Dr Bowman    Family History   Problem  Relation  Age of Onset   .  Heart disease  Mother    .  Cancer  Brother    .  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  His wife expired 4 years ago proximally year after having bypass surgery from rhythm complications. Patient currently lives alone and cares for himself.  History   Substance Use Topics   .  Smoking status:  Current Everyday Smoker -- 2.0 packs/day     Types:  Cigarettes   .  Smokeless tobacco:  Not on file   .  Alcohol Use:  Yes      2 beers a week    Current Outpatient Prescriptions   Medication  Sig  Dispense  Refill   .  amLODipine (NORVASC) 10 MG tablet  Take 10 mg by mouth daily.     .  benazepril-hydrochlorthiazide (LOTENSIN HCT) 20-12.5 MG per tablet  Take 1 tablet by mouth 2 (two) times daily.     .  celecoxib (CELEBREX) 200 MG capsule  Take 200 mg by mouth daily. PRN  On hold   .  clopidogrel (PLAVIX) 75 MG tablet  Take 75 mg by mouth daily.    On hold   .  doxazosin (CARDURA) 4 MG tablet  Take 4 mg by mouth at bedtime.     .  finasteride (PROSCAR) 5 MG tablet  Take 5 mg by mouth daily.     .  furosemide (LASIX) 20 MG tablet  Take 20 mg by mouth daily.     .  glyBURIDE-metformin (GLUCOVANCE) 5-500 MG per tablet  Take 1 tablet by mouth 2 (two) times daily with a meal. 1/2 tablet po bid     .  loratadine (CLARITIN) 10 MG tablet  Take 10 mg by mouth daily.     .  metoprolol (LOPRESSOR) 100 MG tablet  Take 100 mg by mouth 1 day or 1 dose.     .  potassium chloride SA (K-DUR,KLOR-CON) 20 MEQ tablet  Take 20 mEq by mouth daily.     .  simvastatin (ZOCOR) 20 MG tablet  Take 20 mg by mouth at bedtime.      Allergies   Allergen  Reactions   .  Aspirin    Review of Systems  Constitutional: Positive for activity change and fatigue. Negative for chills, diaphoresis and appetite change.  HENT: Positive for neck pain.  Eyes: Negative.  Respiratory: Positive for shortness of breath. Negative for wheezing and stridor.  Cardiovascular: Positive for palpitations. Negative for chest pain and leg swelling.  Gastrointestinal: Negative.  Genitourinary: Positive for frequency. Negative for  hematuria and difficulty urinating.  Musculoskeletal: Positive for back pain. Negative for arthralgias.  Neurological: Positive for dizziness, weakness, light-headedness and numbness. Negative for seizures and headaches.  Hematological: Negative for adenopathy. Does not bruise/bleed easily.  Psychiatric/Behavioral: Negative.     Physical Exam   BP 108/69  Pulse 84  Resp 20  Ht 5' 11" (1.803 m)  Wt 180 lb (81.647 kg)  BMI 25.10 kg/m2  SpO2 98%  Constitutional: He is oriented to person, place, and time. He appears well-nourished. No distress.  HENT:  Mouth/Throat: No oropharyngeal exudate.  Eyes: No scleral icterus.  Neck: Neck supple. No JVD present. No tracheal deviation present. No thyromegaly present.  Cardiovascular: Normal rate, regular rhythm, S1 normal and S2 normal. PMI is not displaced. Exam reveals no gallop, no S3, no S4 and no friction rub.  Murmur heard.  Crescendo systolic murmur is present with a grade of 4/6  No diastolic murmur is present  Pulses:  Carotid pulses are 2+ on the right side, and 2+ on the left side.  Radial pulses are 2+ on the right side, and 2+ on the left side.  Femoral pulses are 2+ on the right side with bruit, and 2+ on the left side with bruit.  Popliteal pulses are 2+ on the right side, and 2+ on the left side.  Dorsalis pedis pulses are 0 on the right side, and 0 on the left side.  Posterior tibial pulses are 0 on the right side, and 0 on the left side.  Pulmonary/Chest: No stridor. No respiratory distress. He has no wheezes. He has no rales. He exhibits no tenderness.  Abdominal: Soft. Bowel sounds are normal.  Musculoskeletal: He exhibits no edema and no tenderness.  Lymphadenopathy:  He has no cervical adenopathy.  Neurological: He is alert and oriented to person, place, and time. No cranial nerve deficit.  Skin: Skin is warm and dry. He is not diaphoretic.  Venous statis changes both lower legs  Psychiatric: He has a normal mood and  affect. His behavior is normal. Judgment and thought content normal.      Diagnostic Tests:  Lab Results   Component  Value  Date    WBC  11.7*  11/20/2010    HGB  12.4*  11/20/2010    HCT  35.0*  11/20/2010    PLT  98*  11/20/2010    GLUCOSE  145*  11/20/2010    ALT  22  02/12/2010    AST  15  02/12/2010    NA  138  11/20/2010    K  3.5  11/20/2010    CL  103  11/20/2010    CREATININE  0.94  11/20/2010    BUN  20  11/20/2010    CO2  27  11/20/2010    PFTs were performed which demonstrated an FEV1 of 1.9   67.2% of predicted with mild obstructive airway disease      Cath 11/19/2010  HEMODYNAMICS:  1. Aortic systolic pressure 144, diastolic pressure 58.  2. Left ventricular systolic pressure 188, end-diastolic pressure 18.  3. RA pressure A-wave 10, V-wave 10, mean 8.  4. RV pressure systolic 31 and diastolic pressure 8.  5. Pulmonary artery pressure A-wave 31, systolic 31, diastolic  pressure 14, mean 24.  6. Pulmonary capillary wedge pressure A-wave 19, V-wave 30, mean 20.  7. Cardiac output by Fick was 5.7 L r minute with an index of 2.8 L  per minute/m2.  8. Right thermodilution 4.8 L per minute with an index of 2.4 L per  minute/m2. The aortic valve gradient measured by pullback was 44  mmHg. The aortic valve area by Fick was 0.7 cm2 and by  thermodilution 0.89 cm2.  SELECTIVE CORONARY ANGIOGRAPHY:  1. Left main normal.  2. LAD normal.  3. Left circumflex normal.  4. Right coronary artery is dominant. Normal.  5. Left ventriculography was not performed.  6. Distal abdominal aortography; distal abdominal aortogram was  performed using 20 mL of Visipaque dye at 20 mL per second x2.  There was a 75% "in-stent restenosis" within the left renal artery  stent. The infrarenal abdominal aorta was free of systemic  atherosclerotic changes. Both iliac artery stents widely patent  Angiographically.    Impression:  #1 critical aortic stenosis symptomatic  #2 peripheral vascular  disease and renal vascular disease  History of cerebrovascular disease with 40-60% bilateral carotid stenosis  #3 history of diabetes with neuropathy  #4 history of thrombocytopenia    Plan:  I've discussed with the patient the diagnosis of critical aortic stenosis which appears to be becoming symptomatic. The risks of aortic valve replacement including death infection stroke myocardial infarction bleeding and blood transfusion were all discussed. I discussed specifically with the patient increased risk because of his age. However he remains very functional. Prior to making a final decision about proceeding with aortic valve replacement of made arrangements for him to have pulmonary function studies performed.  The outcomes of treatment of critical aortic stenosis in symptomatic patients medically is discussed with the patient also.  I discussed with them recent problems with TVAR. Both the pros and cons. At this point I would  deem the patient an operable candidate from the standard approach of aortic valve replacement. The expectation of medical treatment for critical AS is reviewed with the patient and is poor.   He wishes to proceed. His Celebrex and Plavix have been discontinued. We plan to proceed with aortic valve replacement November 12.  The goals, risks and alternatives of the planned surgical procedure AVR have been discussed with the patient in detail.   The risks of the procedure including death, infection, stroke, myocardial infarction, bleeding, blood transfusion have all been discussed specifically. I have quoted Ronald [name] a 5% of perioperative mortality and a complication rate as high as 25 %. The patient's questions have been answered.Ronald [name] is willing to proceed with the planned procedure.      

## 2011-01-03 ENCOUNTER — Encounter (HOSPITAL_COMMUNITY): Payer: BLUE CROSS/BLUE SHIELD

## 2011-01-03 ENCOUNTER — Ambulatory Visit (HOSPITAL_COMMUNITY)
Admission: RE | Admit: 2011-01-03 | Discharge: 2011-01-03 | Disposition: A | Discharge status: Home / Self Care | Payer: Medicare Other | Source: Ambulatory Visit | Attending: Cardiothoracic Surgery | Admitting: Cardiothoracic Surgery

## 2011-01-03 ENCOUNTER — Encounter (HOSPITAL_COMMUNITY): Payer: Self-pay

## 2011-01-03 ENCOUNTER — Encounter (HOSPITAL_COMMUNITY)
Admission: RE | Admit: 2011-01-03 | Discharge: 2011-01-03 | Disposition: A | Discharge status: Home / Self Care | Payer: Medicare Other | Source: Ambulatory Visit | Attending: Cardiothoracic Surgery | Admitting: Cardiothoracic Surgery

## 2011-01-03 ENCOUNTER — Other Ambulatory Visit: Payer: Self-pay

## 2011-01-03 DIAGNOSIS — I359 Nonrheumatic aortic valve disorder, unspecified: Secondary | ICD-10-CM

## 2011-01-03 DIAGNOSIS — I2510 Atherosclerotic heart disease of native coronary artery without angina pectoris: Secondary | ICD-10-CM

## 2011-01-03 DIAGNOSIS — I517 Cardiomegaly: Secondary | ICD-10-CM | POA: Insufficient documentation

## 2011-01-03 DIAGNOSIS — Z01812 Encounter for preprocedural laboratory examination: Secondary | ICD-10-CM | POA: Insufficient documentation

## 2011-01-03 DIAGNOSIS — I77810 Thoracic aortic ectasia: Secondary | ICD-10-CM | POA: Insufficient documentation

## 2011-01-03 DIAGNOSIS — Z01818 Encounter for other preprocedural examination: Secondary | ICD-10-CM | POA: Insufficient documentation

## 2011-01-03 DIAGNOSIS — Z01810 Encounter for preprocedural cardiovascular examination: Secondary | ICD-10-CM

## 2011-01-03 HISTORY — DX: Major depressive disorder, single episode, unspecified: F32.9

## 2011-01-03 HISTORY — DX: Depression, unspecified: F32.A

## 2011-01-03 HISTORY — DX: Peripheral vascular disease, unspecified: I73.9

## 2011-01-03 LAB — URINALYSIS, ROUTINE W REFLEX MICROSCOPIC
Bilirubin Urine: NEGATIVE
Glucose, UA: NEGATIVE mg/dL
Hgb urine dipstick: NEGATIVE
Ketones, ur: NEGATIVE mg/dL
Leukocytes, UA: NEGATIVE
Nitrite: NEGATIVE
Protein, ur: NEGATIVE mg/dL
Specific Gravity, Urine: 1.015 (ref 1.005–1.030)
Urobilinogen, UA: 1 mg/dL (ref 0.0–1.0)
pH: 5.5 (ref 5.0–8.0)

## 2011-01-03 LAB — COMPREHENSIVE METABOLIC PANEL
ALT: 20 U/L (ref 0–53)
AST: 14 U/L (ref 0–37)
Albumin: 3.6 g/dL (ref 3.5–5.2)
Alkaline Phosphatase: 74 U/L (ref 39–117)
BUN: 26 mg/dL — ABNORMAL HIGH (ref 6–23)
CO2: 24 mEq/L (ref 19–32)
Calcium: 9.8 mg/dL (ref 8.4–10.5)
Chloride: 101 mEq/L (ref 96–112)
Creatinine, Ser: 1.18 mg/dL (ref 0.50–1.35)
GFR calc Af Amer: 63 mL/min — ABNORMAL LOW (ref >90)
GFR calc non Af Amer: 54 mL/min — ABNORMAL LOW (ref >90)
Glucose, Bld: 223 mg/dL — ABNORMAL HIGH (ref 70–99)
Potassium: 3.8 mEq/L (ref 3.5–5.1)
Sodium: 138 mEq/L (ref 135–145)
Total Bilirubin: 0.4 mg/dL (ref 0.3–1.2)
Total Protein: 6.8 g/dL (ref 6.0–8.3)

## 2011-01-03 LAB — SURGICAL PCR SCREEN
MRSA, PCR: NEGATIVE
Staphylococcus aureus: NEGATIVE

## 2011-01-03 LAB — CBC
HCT: 37.6 % — ABNORMAL LOW (ref 39.0–52.0)
Hemoglobin: 13.3 g/dL (ref 13.0–17.0)
MCH: 32.4 pg (ref 26.0–34.0)
MCHC: 35.4 g/dL (ref 30.0–36.0)
MCV: 91.5 fL (ref 78.0–100.0)
Platelets: 109 10*3/uL — ABNORMAL LOW (ref 150–400)
RBC: 4.11 MIL/uL — ABNORMAL LOW (ref 4.22–5.81)
RDW: 13.7 % (ref 11.5–15.5)
WBC: 8.8 10*3/uL (ref 4.0–10.5)

## 2011-01-03 LAB — APTT: aPTT: 32 seconds (ref 24–37)

## 2011-01-03 LAB — BLOOD GAS, ARTERIAL
Acid-Base Excess: 2.6 mmol/L — ABNORMAL HIGH (ref 0.0–2.0)
Bicarbonate: 26.3 mEq/L — ABNORMAL HIGH (ref 20.0–24.0)
Drawn by: 344351
FIO2: 0.21 %
O2 Saturation: 99.1 %
Patient temperature: 98.6
TCO2: 27.5 mmol/L (ref 0–100)
pCO2 arterial: 38.4 mmHg (ref 35.0–45.0)
pH, Arterial: 7.45 (ref 7.350–7.450)
pO2, Arterial: 117 mmHg — ABNORMAL HIGH (ref 80.0–100.0)

## 2011-01-03 LAB — TYPE AND SCREEN
ABO/RH(D): O POS
Antibody Screen: NEGATIVE

## 2011-01-03 LAB — HEMOGLOBIN A1C
Hgb A1c MFr Bld: 8 % — ABNORMAL HIGH (ref <5.7)
Mean Plasma Glucose: 183 mg/dL — ABNORMAL HIGH (ref <117)

## 2011-01-03 LAB — PROTIME-INR
INR: 1.1 (ref 0.00–1.49)
Prothrombin Time: 14.4 seconds (ref 11.6–15.2)

## 2011-01-03 LAB — ABO/RH: ABO/RH(D): O POS

## 2011-01-03 MED ORDER — CHLORHEXIDINE GLUCONATE 4 % EX LIQD: 30.0000 mL | CUTANEOUS | Status: DC

## 2011-01-03 NOTE — Progress Notes (Signed)
Dr  Dennie Maizes office called.. Pt in a-fibb .Marland KitchenMarland KitchenRevonda Standard pa advised...  No mention of a-fibb in Dr Dennie Maizes note.  Dee advised.. She spoke with Dr Tyrone Sage and  I will call Dr Erlene Quan to advise him of a-fibb.  Paged Dawson Bills .Marland Kitchen She will speak to Dr Allyson Sabal and call me.

## 2011-01-03 NOTE — Progress Notes (Signed)
Per resp therapy the pft s were cancelled by Dr Ysidro Evert office... For today ... They were done earlier 12/14/10 Dopplers were done today 01/03/11

## 2011-01-03 NOTE — Pre-Procedure Instructions (Signed)
[address] Chand[address] Fly [facility]  01/03/2011   Your procedure is scheduled on:  01/07/11 Monday  Report to [facility] at 0530 AM.  Call this number if you have problems the morning of surgery: [phone]   Remember:   Do not eat food:After Midnight.  Do not drink clear liquids: 4 Hours before arrival.  Take these medicines the morning of surgery with A SIP OF WATER: Amlodipine cardura claritin metoprolol    Do not wear jewelry, make-up or nail polish.  Do not wear lotions, powders, or perfumes. You may wear deodorant.  Do not shave 48 hours prior to surgery.  Do not bring valuables to the hospital.  Contacts, dentures or bridgework may not be worn into surgery.  Leave suitcase in the car. After surgery it may be brought to your room.  For patients admitted to the hospital, checkout time is 11:00 AM the day of discharge.   Patients discharged the day of surgery will not be allowed to drive home.  Name and phone number of your driver: family  Special Instructions: CHG Shower Use Special Wash: 1/2 bottle night before surgery and 1/2 bottle morning of surgery.   Please read over the following fact sheets that you were given: Pain Booklet, Coughing and Deep Breathing, Blood Transfusion Information, Lab Information, Open Heart Packet, MRSA Information and Surgical Site Infection Prevention

## 2011-01-03 NOTE — Progress Notes (Signed)
Per Heide Scales... Dr Eugenio Hoes pt in a-fibb and she will ask him to speak to dr gerhardt about this.

## 2011-01-03 NOTE — Progress Notes (Signed)
*  PRELIMINARY RESULTS*  Pre CABG Doppler has been performed. Right ICA showed 60-79% stenosis highest end. Left ICA showed no significant ICA stenosis. Antegrade vertebral flow bilaterally. Right brachial, radial, and ulnar arteries have triphasic waveforms. Left brachial and radial arteries have triphasic waveforms, while the ulnar waveform is biphasic. Palmar arch evaluation - Right Doppler waveforms remained normal with radial and ulnar compressions. Left Doppler waveforms decreased by 50% with radial compression and remained normal with ulnar compression. Right brachial pressure is 113 and left brachial pressure is 111.  Farrel Demark 01/03/2011, 2:41 PM

## 2011-01-04 ENCOUNTER — Encounter (HOSPITAL_COMMUNITY): Payer: Self-pay | Admitting: Pharmacy Technician

## 2011-01-06 MED ORDER — CEFUROXIME SODIUM 1.5 G IJ SOLR
1.5000 g | Freq: Once | INTRAMUSCULAR | Status: DC
  Filled 2011-01-06 (×2): qty 1.5

## 2011-01-06 MED ORDER — DEXTROSE 5 % IV SOLN
1.5000 g | Freq: Once | INTRAVENOUS | Status: DC
  Filled 2011-01-06: qty 1.5

## 2011-01-06 MED ORDER — SODIUM CHLORIDE 0.9 % IV SOLN
0.1000 ug/kg/h | INTRAVENOUS | Status: DC
  Filled 2011-01-06: qty 4

## 2011-01-06 MED ORDER — DEXTROSE 5 % IV SOLN
750.0000 mg | INTRAVENOUS | Status: DC
  Filled 2011-01-06 (×2): qty 750

## 2011-01-06 MED ORDER — SODIUM CHLORIDE 0.9 % IV SOLN
INTRAVENOUS | Status: DC
  Filled 2011-01-06: qty 1

## 2011-01-06 MED ORDER — POTASSIUM CHLORIDE 2 MEQ/ML IV SOLN
80.0000 meq | INTRAVENOUS | Status: DC
  Filled 2011-01-06: qty 40

## 2011-01-06 MED ORDER — PHENYLEPHRINE HCL 10 MG/ML IJ SOLN: 30.0000 ug/min | INTRAVENOUS | Status: DC

## 2011-01-06 MED ORDER — DOPAMINE-DEXTROSE 3.2-5 MG/ML-% IV SOLN: 2.0000 ug/kg/min | INTRAVENOUS | Status: DC

## 2011-01-06 MED ORDER — DOPAMINE-DEXTROSE 3.2-5 MG/ML-% IV SOLN
2.0000 ug/kg/min | INTRAVENOUS | Status: DC
  Filled 2011-01-06: qty 250

## 2011-01-06 MED ORDER — MAGNESIUM SULFATE 50 % IJ SOLN
40.0000 meq | INTRAMUSCULAR | Status: DC
  Filled 2011-01-06: qty 10

## 2011-01-06 MED ORDER — EPINEPHRINE HCL 1 MG/ML IJ SOLN
0.5000 ug/min | INTRAVENOUS | Status: DC
  Filled 2011-01-06: qty 4

## 2011-01-06 MED ORDER — VANCOMYCIN HCL 1000 MG IV SOLR
1250.0000 mg | Freq: Once | INTRAVENOUS | Status: DC
  Filled 2011-01-06: qty 1250

## 2011-01-06 MED ORDER — NITROGLYCERIN IN D5W 200-5 MCG/ML-% IV SOLN
2.0000 ug/min | INTRAVENOUS | Status: DC
  Filled 2011-01-06: qty 250

## 2011-01-06 MED ORDER — DEXTROSE 5 % IV SOLN
30.0000 ug/min | INTRAVENOUS | Status: DC
  Filled 2011-01-06: qty 2

## 2011-01-06 MED ORDER — SODIUM CHLORIDE 0.9 % IV SOLN
INTRAVENOUS | Status: DC
  Filled 2011-01-06: qty 40

## 2011-01-06 MED ORDER — PLASMA-LYTE 148 IV SOLN
INTRAVENOUS | Status: AC
  Administered 2011-01-07: 11:00:00
  Filled 2011-01-06: qty 0.5

## 2011-01-07 ENCOUNTER — Encounter: Payer: Self-pay | Admitting: Cardiothoracic Surgery

## 2011-01-07 ENCOUNTER — Inpatient Hospital Stay (HOSPITAL_COMMUNITY): Payer: Medicare Other

## 2011-01-07 ENCOUNTER — Encounter (HOSPITAL_COMMUNITY)
Admission: RE | Disposition: A | Discharge status: Home Health | Payer: Self-pay | Source: Ambulatory Visit | Attending: Cardiothoracic Surgery

## 2011-01-07 ENCOUNTER — Encounter (HOSPITAL_COMMUNITY): Payer: Self-pay | Admitting: Vascular Surgery

## 2011-01-07 ENCOUNTER — Inpatient Hospital Stay (HOSPITAL_COMMUNITY)
Admission: RE | Admit: 2011-01-07 | Discharge: 2011-01-17 | DRG: 220 | Disposition: A | Discharge status: Home Health | Payer: Medicare Other | Source: Ambulatory Visit | Attending: Cardiothoracic Surgery | Admitting: Cardiothoracic Surgery

## 2011-01-07 ENCOUNTER — Inpatient Hospital Stay (HOSPITAL_COMMUNITY): Payer: Medicare Other | Admitting: Anesthesiology

## 2011-01-07 ENCOUNTER — Encounter (HOSPITAL_COMMUNITY): Payer: Self-pay | Admitting: *Deleted

## 2011-01-07 ENCOUNTER — Other Ambulatory Visit: Payer: Self-pay | Admitting: Cardiothoracic Surgery

## 2011-01-07 ENCOUNTER — Encounter (HOSPITAL_COMMUNITY): Payer: Self-pay | Admitting: Anesthesiology

## 2011-01-07 DIAGNOSIS — E8779 Other fluid overload: Secondary | ICD-10-CM | POA: Diagnosis not present

## 2011-01-07 DIAGNOSIS — N189 Chronic kidney disease, unspecified: Secondary | ICD-10-CM | POA: Diagnosis present

## 2011-01-07 DIAGNOSIS — J40 Bronchitis, not specified as acute or chronic: Secondary | ICD-10-CM | POA: Diagnosis not present

## 2011-01-07 DIAGNOSIS — D62 Acute posthemorrhagic anemia: Secondary | ICD-10-CM | POA: Diagnosis not present

## 2011-01-07 DIAGNOSIS — E785 Hyperlipidemia, unspecified: Secondary | ICD-10-CM | POA: Diagnosis present

## 2011-01-07 DIAGNOSIS — R0902 Hypoxemia: Secondary | ICD-10-CM | POA: Diagnosis not present

## 2011-01-07 DIAGNOSIS — G2581 Restless legs syndrome: Secondary | ICD-10-CM | POA: Diagnosis present

## 2011-01-07 DIAGNOSIS — I129 Hypertensive chronic kidney disease with stage 1 through stage 4 chronic kidney disease, or unspecified chronic kidney disease: Secondary | ICD-10-CM | POA: Diagnosis present

## 2011-01-07 DIAGNOSIS — Z85828 Personal history of other malignant neoplasm of skin: Secondary | ICD-10-CM

## 2011-01-07 DIAGNOSIS — F17200 Nicotine dependence, unspecified, uncomplicated: Secondary | ICD-10-CM | POA: Diagnosis present

## 2011-01-07 DIAGNOSIS — R21 Rash and other nonspecific skin eruption: Secondary | ICD-10-CM | POA: Diagnosis present

## 2011-01-07 DIAGNOSIS — Z8551 Personal history of malignant neoplasm of bladder: Secondary | ICD-10-CM

## 2011-01-07 DIAGNOSIS — Z7902 Long term (current) use of antithrombotics/antiplatelets: Secondary | ICD-10-CM

## 2011-01-07 DIAGNOSIS — F3289 Other specified depressive episodes: Secondary | ICD-10-CM | POA: Diagnosis present

## 2011-01-07 DIAGNOSIS — E876 Hypokalemia: Secondary | ICD-10-CM | POA: Diagnosis not present

## 2011-01-07 DIAGNOSIS — E1159 Type 2 diabetes mellitus with other circulatory complications: Secondary | ICD-10-CM | POA: Diagnosis present

## 2011-01-07 DIAGNOSIS — I359 Nonrheumatic aortic valve disorder, unspecified: Principal | ICD-10-CM | POA: Diagnosis present

## 2011-01-07 DIAGNOSIS — E1142 Type 2 diabetes mellitus with diabetic polyneuropathy: Secondary | ICD-10-CM | POA: Diagnosis present

## 2011-01-07 DIAGNOSIS — I658 Occlusion and stenosis of other precerebral arteries: Secondary | ICD-10-CM | POA: Diagnosis present

## 2011-01-07 DIAGNOSIS — I6529 Occlusion and stenosis of unspecified carotid artery: Secondary | ICD-10-CM | POA: Diagnosis present

## 2011-01-07 DIAGNOSIS — E118 Type 2 diabetes mellitus with unspecified complications: Secondary | ICD-10-CM | POA: Diagnosis present

## 2011-01-07 DIAGNOSIS — I739 Peripheral vascular disease, unspecified: Secondary | ICD-10-CM | POA: Diagnosis present

## 2011-01-07 DIAGNOSIS — Z952 Presence of prosthetic heart valve: Secondary | ICD-10-CM

## 2011-01-07 DIAGNOSIS — I4891 Unspecified atrial fibrillation: Secondary | ICD-10-CM | POA: Diagnosis present

## 2011-01-07 DIAGNOSIS — D696 Thrombocytopenia, unspecified: Secondary | ICD-10-CM

## 2011-01-07 DIAGNOSIS — F329 Major depressive disorder, single episode, unspecified: Secondary | ICD-10-CM | POA: Diagnosis present

## 2011-01-07 DIAGNOSIS — I798 Other disorders of arteries, arterioles and capillaries in diseases classified elsewhere: Secondary | ICD-10-CM | POA: Diagnosis present

## 2011-01-07 DIAGNOSIS — I872 Venous insufficiency (chronic) (peripheral): Secondary | ICD-10-CM | POA: Diagnosis present

## 2011-01-07 DIAGNOSIS — I350 Nonrheumatic aortic (valve) stenosis: Secondary | ICD-10-CM | POA: Diagnosis present

## 2011-01-07 DIAGNOSIS — K5730 Diverticulosis of large intestine without perforation or abscess without bleeding: Secondary | ICD-10-CM | POA: Diagnosis present

## 2011-01-07 DIAGNOSIS — E1149 Type 2 diabetes mellitus with other diabetic neurological complication: Secondary | ICD-10-CM | POA: Diagnosis present

## 2011-01-07 DIAGNOSIS — D72829 Elevated white blood cell count, unspecified: Secondary | ICD-10-CM

## 2011-01-07 HISTORY — PX: AORTIC VALVE REPLACEMENT: SHX41

## 2011-01-07 LAB — POCT I-STAT 4, (NA,K, GLUC, HGB,HCT)
Glucose, Bld: 178 mg/dL — ABNORMAL HIGH (ref 70–99)
Glucose, Bld: 182 mg/dL — ABNORMAL HIGH (ref 70–99)
Glucose, Bld: 166 mg/dL — ABNORMAL HIGH (ref 70–99)
Glucose, Bld: 187 mg/dL — ABNORMAL HIGH (ref 70–99)
Glucose, Bld: 185 mg/dL — ABNORMAL HIGH (ref 70–99)
Glucose, Bld: 137 mg/dL — ABNORMAL HIGH (ref 70–99)
HCT: 34 % — ABNORMAL LOW (ref 39.0–52.0)
HCT: 33 % — ABNORMAL LOW (ref 39.0–52.0)
HCT: 42 % (ref 39.0–52.0)
HCT: 25 % — ABNORMAL LOW (ref 39.0–52.0)
HCT: 24 % — ABNORMAL LOW (ref 39.0–52.0)
HCT: 24 % — ABNORMAL LOW (ref 39.0–52.0)
Hemoglobin: 11.6 g/dL — ABNORMAL LOW (ref 13.0–17.0)
Hemoglobin: 11.2 g/dL — ABNORMAL LOW (ref 13.0–17.0)
Hemoglobin: 14.3 g/dL (ref 13.0–17.0)
Hemoglobin: 8.5 g/dL — ABNORMAL LOW (ref 13.0–17.0)
Hemoglobin: 8.2 g/dL — ABNORMAL LOW (ref 13.0–17.0)
Hemoglobin: 8.2 g/dL — ABNORMAL LOW (ref 13.0–17.0)
Potassium: 3.5 mEq/L (ref 3.5–5.1)
Potassium: 3.3 mEq/L — ABNORMAL LOW (ref 3.5–5.1)
Potassium: 3.2 mEq/L — ABNORMAL LOW (ref 3.5–5.1)
Potassium: 3.4 mEq/L — ABNORMAL LOW (ref 3.5–5.1)
Potassium: 3.5 mEq/L (ref 3.5–5.1)
Potassium: 3.2 mEq/L — ABNORMAL LOW (ref 3.5–5.1)
Sodium: 142 mEq/L (ref 135–145)
Sodium: 141 mEq/L (ref 135–145)
Sodium: 142 mEq/L (ref 135–145)
Sodium: 140 mEq/L (ref 135–145)
Sodium: 141 mEq/L (ref 135–145)
Sodium: 142 mEq/L (ref 135–145)

## 2011-01-07 LAB — CREATININE, SERUM
Creatinine, Ser: 0.93 mg/dL (ref 0.50–1.35)
GFR calc Af Amer: 86 mL/min — ABNORMAL LOW (ref >90)
GFR calc non Af Amer: 74 mL/min — ABNORMAL LOW (ref >90)

## 2011-01-07 LAB — PROTIME-INR
INR: 1.69 — ABNORMAL HIGH (ref 0.00–1.49)
Prothrombin Time: 20.2 seconds — ABNORMAL HIGH (ref 11.6–15.2)

## 2011-01-07 LAB — POCT I-STAT 3, ART BLOOD GAS (G3+)
Acid-Base Excess: 2 mmol/L (ref 0.0–2.0)
Acid-base deficit: 3 mmol/L — ABNORMAL HIGH (ref 0.0–2.0)
Acid-base deficit: 2 mmol/L (ref 0.0–2.0)
Acid-base deficit: 2 mmol/L (ref 0.0–2.0)
Bicarbonate: 27.1 mEq/L — ABNORMAL HIGH (ref 20.0–24.0)
Bicarbonate: 22.5 mEq/L (ref 20.0–24.0)
Bicarbonate: 22.9 mEq/L (ref 20.0–24.0)
Bicarbonate: 22.7 mEq/L (ref 20.0–24.0)
O2 Saturation: 100 %
O2 Saturation: 96 %
O2 Saturation: 99 %
O2 Saturation: 96 %
Patient temperature: 35.4
Patient temperature: 36.5
Patient temperature: 36.5
TCO2: 28 mmol/L (ref 0–100)
TCO2: 24 mmol/L (ref 0–100)
TCO2: 24 mmol/L (ref 0–100)
TCO2: 24 mmol/L (ref 0–100)
pCO2 arterial: 46.1 mmHg — ABNORMAL HIGH (ref 35.0–45.0)
pCO2 arterial: 39.9 mmHg (ref 35.0–45.0)
pCO2 arterial: 36.3 mmHg (ref 35.0–45.0)
pCO2 arterial: 38.8 mmHg (ref 35.0–45.0)
pH, Arterial: 7.377 (ref 7.350–7.450)
pH, Arterial: 7.353 (ref 7.350–7.450)
pH, Arterial: 7.405 (ref 7.350–7.450)
pH, Arterial: 7.373 (ref 7.350–7.450)
pO2, Arterial: 367 mmHg — ABNORMAL HIGH (ref 80.0–100.0)
pO2, Arterial: 81 mmHg (ref 80.0–100.0)
pO2, Arterial: 128 mmHg — ABNORMAL HIGH (ref 80.0–100.0)
pO2, Arterial: 83 mmHg (ref 80.0–100.0)

## 2011-01-07 LAB — CBC
HCT: 25.7 % — ABNORMAL LOW (ref 39.0–52.0)
HCT: 30.4 % — ABNORMAL LOW (ref 39.0–52.0)
Hemoglobin: 9.1 g/dL — ABNORMAL LOW (ref 13.0–17.0)
Hemoglobin: 10.7 g/dL — ABNORMAL LOW (ref 13.0–17.0)
MCH: 32.5 pg (ref 26.0–34.0)
MCH: 31.8 pg (ref 26.0–34.0)
MCHC: 35.4 g/dL (ref 30.0–36.0)
MCHC: 35.2 g/dL (ref 30.0–36.0)
MCV: 91.8 fL (ref 78.0–100.0)
MCV: 90.5 fL (ref 78.0–100.0)
Platelets: 59 10*3/uL — ABNORMAL LOW (ref 150–400)
Platelets: 68 10*3/uL — ABNORMAL LOW (ref 150–400)
RBC: 2.8 MIL/uL — ABNORMAL LOW (ref 4.22–5.81)
RBC: 3.36 MIL/uL — ABNORMAL LOW (ref 4.22–5.81)
RDW: 13.6 % (ref 11.5–15.5)
RDW: 13.7 % (ref 11.5–15.5)
WBC: 16.4 10*3/uL — ABNORMAL HIGH (ref 4.0–10.5)
WBC: 19.3 10*3/uL — ABNORMAL HIGH (ref 4.0–10.5)

## 2011-01-07 LAB — HEMOGLOBIN AND HEMATOCRIT, BLOOD
HCT: 24.1 % — ABNORMAL LOW (ref 39.0–52.0)
Hemoglobin: 8.6 g/dL — ABNORMAL LOW (ref 13.0–17.0)

## 2011-01-07 LAB — POCT I-STAT, CHEM 8
BUN: 22 mg/dL (ref 6–23)
Calcium, Ion: 1.07 mmol/L — ABNORMAL LOW (ref 1.12–1.32)
Chloride: 106 mEq/L (ref 96–112)
Creatinine, Ser: 1 mg/dL (ref 0.50–1.35)
Glucose, Bld: 106 mg/dL — ABNORMAL HIGH (ref 70–99)
HCT: 31 % — ABNORMAL LOW (ref 39.0–52.0)
Hemoglobin: 10.5 g/dL — ABNORMAL LOW (ref 13.0–17.0)
Potassium: 3.8 mEq/L (ref 3.5–5.1)
Sodium: 145 mEq/L (ref 135–145)
TCO2: 22 mmol/L (ref 0–100)

## 2011-01-07 LAB — PLATELET COUNT: Platelets: 80 10*3/uL — ABNORMAL LOW (ref 150–400)

## 2011-01-07 LAB — GLUCOSE, CAPILLARY: Glucose-Capillary: 183 mg/dL — ABNORMAL HIGH (ref 70–99)

## 2011-01-07 LAB — APTT: aPTT: 45 seconds — ABNORMAL HIGH (ref 24–37)

## 2011-01-07 LAB — MAGNESIUM: Magnesium: 2.6 mg/dL — ABNORMAL HIGH (ref 1.5–2.5)

## 2011-01-07 SURGERY — REPLACEMENT, AORTIC VALVE, OPEN
Anesthesia: General | Site: Chest | Wound class: Clean

## 2011-01-07 MED ORDER — HEPARIN SODIUM (PORCINE) 1000 UNIT/ML IJ SOLN
INTRAMUSCULAR | Status: DC | PRN
  Administered 2011-01-07: 25000 [IU] via INTRAVENOUS

## 2011-01-07 MED ORDER — ACETAMINOPHEN 160 MG/5ML PO SOLN: 650.0000 mg | ORAL | Status: AC

## 2011-01-07 MED ORDER — VANCOMYCIN HCL 1000 MG IV SOLR: 1000.0000 mg | Freq: Once | INTRAVENOUS | Status: DC

## 2011-01-07 MED ORDER — SODIUM CHLORIDE 0.9 % IV SOLN
0.7000 ug/kg/h | INTRAVENOUS | Status: DC
  Filled 2011-01-07: qty 2

## 2011-01-07 MED ORDER — SODIUM CHLORIDE 0.9 % IJ SOLN
3.0000 mL | Freq: Two times a day (BID) | INTRAMUSCULAR | Status: DC
  Administered 2011-01-08 – 2011-01-12 (×7): 3 mL via INTRAVENOUS

## 2011-01-07 MED ORDER — ALBUTEROL SULFATE (2.5 MG/3ML) 0.083% IN NEBU
INHALATION_SOLUTION | RESPIRATORY_TRACT | Status: DC | PRN
  Administered 2011-01-07: .036 mg via RESPIRATORY_TRACT

## 2011-01-07 MED ORDER — LACTATED RINGERS IV SOLN: 500.0000 mL | Freq: Once | INTRAVENOUS | Status: AC | PRN

## 2011-01-07 MED ORDER — MIDAZOLAM HCL 2 MG/2ML IJ SOLN: 2.0000 mg | INTRAMUSCULAR | Status: DC | PRN

## 2011-01-07 MED ORDER — SODIUM CHLORIDE 0.9 % IV SOLN: INTRAVENOUS | Status: DC

## 2011-01-07 MED ORDER — MORPHINE SULFATE 4 MG/ML IJ SOLN
2.0000 mg | INTRAMUSCULAR | Status: DC | PRN
  Administered 2011-01-08 (×2): 2 mg via INTRAVENOUS
  Filled 2011-01-07: qty 1

## 2011-01-07 MED ORDER — PROPOFOL 10 MG/ML IV EMUL
INTRAVENOUS | Status: DC | PRN
  Administered 2011-01-07: 50 mg via INTRAVENOUS

## 2011-01-07 MED ORDER — PHENYLEPHRINE HCL 10 MG/ML IJ SOLN
0.0000 ug/min | INTRAVENOUS | Status: DC
  Administered 2011-01-07: 50 ug/min via INTRAVENOUS
  Filled 2011-01-07: qty 2

## 2011-01-07 MED ORDER — LACTATED RINGERS IV SOLN
INTRAVENOUS | Status: DC | PRN
  Administered 2011-01-07: 08:00:00 via INTRAVENOUS

## 2011-01-07 MED ORDER — INSULIN REGULAR HUMAN 100 UNIT/ML IJ SOLN
INTRAMUSCULAR | Status: DC
  Filled 2011-01-07: qty 1

## 2011-01-07 MED ORDER — LIDOCAINE HCL (CARDIAC) 20 MG/ML IV SOLN
INTRAVENOUS | Status: DC | PRN
  Administered 2011-01-07: 60 mg via INTRAVENOUS

## 2011-01-07 MED ORDER — DEXTROSE 5 % IV SOLN
1.5000 g | Freq: Two times a day (BID) | INTRAVENOUS | Status: DC
  Filled 2011-01-07: qty 1.5

## 2011-01-07 MED ORDER — PANTOPRAZOLE SODIUM 40 MG PO TBEC
40.0000 mg | DELAYED_RELEASE_TABLET | Freq: Every day | ORAL | Status: DC
  Administered 2011-01-09: 40 mg via ORAL
  Filled 2011-01-07: qty 1

## 2011-01-07 MED ORDER — NITROGLYCERIN IN D5W 200-5 MCG/ML-% IV SOLN: 0.0000 ug/min | INTRAVENOUS | Status: DC

## 2011-01-07 MED ORDER — SODIUM CHLORIDE 0.9 % IV SOLN
0.1000 ug/kg/h | INTRAVENOUS | Status: DC
  Filled 2011-01-07: qty 4

## 2011-01-07 MED ORDER — SODIUM CHLORIDE 0.9 % IV SOLN
100.0000 [IU] | INTRAVENOUS | Status: DC | PRN
  Administered 2011-01-07: 3.5 [IU]/h via INTRAVENOUS

## 2011-01-07 MED ORDER — HEMOSTATIC AGENTS (NO CHARGE) OPTIME
TOPICAL | Status: DC | PRN
  Administered 2011-01-07: 1 via TOPICAL

## 2011-01-07 MED ORDER — SODIUM CHLORIDE 0.9 % IV SOLN: 250.0000 mL | INTRAVENOUS | Status: DC

## 2011-01-07 MED ORDER — VECURONIUM BROMIDE 10 MG IV SOLR
INTRAVENOUS | Status: DC | PRN
  Administered 2011-01-07 (×2): 5 mg via INTRAVENOUS

## 2011-01-07 MED ORDER — DOCUSATE SODIUM 100 MG PO CAPS
200.0000 mg | ORAL_CAPSULE | Freq: Every day | ORAL | Status: DC
  Administered 2011-01-08 – 2011-01-09 (×2): 200 mg via ORAL
  Filled 2011-01-07 (×3): qty 2

## 2011-01-07 MED ORDER — DEXTROSE 5 % IV SOLN
0.7500 g | INTRAVENOUS | Status: DC | PRN
  Administered 2011-01-07: 1.5 g via INTRAVENOUS
  Administered 2011-01-07: .75 g via INTRAVENOUS

## 2011-01-07 MED ORDER — MORPHINE SULFATE 2 MG/ML IJ SOLN
1.0000 mg | INTRAMUSCULAR | Status: AC | PRN
  Administered 2011-01-07 (×2): 2 mg via INTRAVENOUS
  Filled 2011-01-07 (×2): qty 1

## 2011-01-07 MED ORDER — FAMOTIDINE IN NACL 20-0.9 MG/50ML-% IV SOLN
20.0000 mg | Freq: Two times a day (BID) | INTRAVENOUS | Status: AC
  Administered 2011-01-07: 20 mg via INTRAVENOUS
  Filled 2011-01-07: qty 50

## 2011-01-07 MED ORDER — SODIUM CHLORIDE 0.9 % IJ SOLN
OROMUCOSAL | Status: DC | PRN
  Administered 2011-01-07 (×2): via TOPICAL

## 2011-01-07 MED ORDER — POTASSIUM CHLORIDE 10 MEQ/50ML IV SOLN
10.0000 meq | INTRAVENOUS | Status: AC
  Administered 2011-01-07 (×3): 10 meq via INTRAVENOUS

## 2011-01-07 MED ORDER — DEXTROSE 5 % IV SOLN
1.5000 g | Freq: Two times a day (BID) | INTRAVENOUS | Status: AC
  Administered 2011-01-07 – 2011-01-09 (×4): 1.5 g via INTRAVENOUS
  Filled 2011-01-07 (×4): qty 1.5

## 2011-01-07 MED ORDER — SIMVASTATIN 20 MG PO TABS
20.0000 mg | ORAL_TABLET | Freq: Every day | ORAL | Status: DC
  Administered 2011-01-07 – 2011-01-16 (×10): 20 mg via ORAL
  Filled 2011-01-07 (×11): qty 1

## 2011-01-07 MED ORDER — SODIUM CHLORIDE 0.9 % IV SOLN
200.0000 ug | INTRAVENOUS | Status: DC | PRN
  Administered 2011-01-07: 0.3 ug/kg/h via INTRAVENOUS

## 2011-01-07 MED ORDER — MAGNESIUM SULFATE 50 % IJ SOLN
4.0000 g | Freq: Once | INTRAVENOUS | Status: AC
  Administered 2011-01-07: 4 g via INTRAVENOUS
  Filled 2011-01-07: qty 8

## 2011-01-07 MED ORDER — PROTAMINE SULFATE 10 MG/ML IV SOLN
INTRAVENOUS | Status: DC | PRN
  Administered 2011-01-07: 250 mg via INTRAVENOUS

## 2011-01-07 MED ORDER — ACETAMINOPHEN 500 MG PO TABS
1000.0000 mg | ORAL_TABLET | Freq: Four times a day (QID) | ORAL | Status: DC
  Administered 2011-01-08 – 2011-01-12 (×17): 1000 mg via ORAL
  Filled 2011-01-07 (×19): qty 2

## 2011-01-07 MED ORDER — ALBUTEROL SULFATE HFA 108 (90 BASE) MCG/ACT IN AERS
2.0000 | INHALATION_SPRAY | Freq: Four times a day (QID) | RESPIRATORY_TRACT | Status: DC
  Administered 2011-01-07 – 2011-01-17 (×38): 2 via RESPIRATORY_TRACT
  Filled 2011-01-07 (×2): qty 6.7

## 2011-01-07 MED ORDER — METOPROLOL TARTRATE 12.5 MG HALF TABLET
12.5000 mg | ORAL_TABLET | Freq: Two times a day (BID) | ORAL | Status: DC
  Filled 2011-01-07 (×3): qty 1

## 2011-01-07 MED ORDER — SODIUM CHLORIDE 0.9 % IJ SOLN: 3.0000 mL | INTRAMUSCULAR | Status: DC | PRN

## 2011-01-07 MED ORDER — ACETAMINOPHEN 650 MG RE SUPP
650.0000 mg | RECTAL | Status: AC
  Administered 2011-01-07: 650 mg via RECTAL

## 2011-01-07 MED ORDER — BISACODYL 10 MG RE SUPP: 10.0000 mg | Freq: Every day | RECTAL | Status: DC

## 2011-01-07 MED ORDER — FINASTERIDE 5 MG PO TABS
5.0000 mg | ORAL_TABLET | Freq: Every day | ORAL | Status: DC
  Administered 2011-01-08 – 2011-01-17 (×10): 5 mg via ORAL
  Filled 2011-01-07 (×11): qty 1

## 2011-01-07 MED ORDER — MIDAZOLAM HCL 5 MG/5ML IJ SOLN
INTRAMUSCULAR | Status: DC | PRN
  Administered 2011-01-07: 6 mg via INTRAVENOUS
  Administered 2011-01-07: 1 mg via INTRAVENOUS
  Administered 2011-01-07: 3 mg via INTRAVENOUS

## 2011-01-07 MED ORDER — VANCOMYCIN HCL 1000 MG IV SOLR
1000.0000 mg | Freq: Once | INTRAVENOUS | Status: AC
  Administered 2011-01-07: 1000 mg via INTRAVENOUS
  Filled 2011-01-07: qty 1000

## 2011-01-07 MED ORDER — METOPROLOL TARTRATE 1 MG/ML IV SOLN: 2.5000 mg | INTRAVENOUS | Status: DC | PRN

## 2011-01-07 MED ORDER — SODIUM CHLORIDE 0.45 % IV SOLN: INTRAVENOUS | Status: DC

## 2011-01-07 MED ORDER — SODIUM CHLORIDE 0.9 % IV SOLN
0.1000 ug/kg/h | INTRAVENOUS | Status: DC
  Filled 2011-01-07: qty 2

## 2011-01-07 MED ORDER — SODIUM CHLORIDE 0.9 % IV SOLN
INTRAVENOUS | Status: DC | PRN
  Administered 2011-01-07 (×2): via INTRAVENOUS

## 2011-01-07 MED ORDER — ACETAMINOPHEN 160 MG/5ML PO SOLN
975.0000 mg | Freq: Four times a day (QID) | ORAL | Status: DC
  Filled 2011-01-07: qty 40.6

## 2011-01-07 MED ORDER — LACTATED RINGERS IV SOLN
INTRAVENOUS | Status: DC
  Administered 2011-01-08: 20 mL via INTRAVENOUS

## 2011-01-07 MED ORDER — ALBUMIN HUMAN 5 % IV SOLN
250.0000 mL | INTRAVENOUS | Status: DC | PRN
  Administered 2011-01-07 (×3): 250 mL via INTRAVENOUS
  Filled 2011-01-07: qty 250

## 2011-01-07 MED ORDER — ALBUMIN HUMAN 5 % IV SOLN
INTRAVENOUS | Status: AC
  Administered 2011-01-07: 250 mL via INTRAVENOUS
  Filled 2011-01-07: qty 250

## 2011-01-07 MED ORDER — OXYCODONE HCL 5 MG PO TABS: 5.0000 mg | ORAL_TABLET | ORAL | Status: DC | PRN

## 2011-01-07 MED ORDER — FENTANYL CITRATE 0.05 MG/ML IJ SOLN
INTRAMUSCULAR | Status: DC | PRN
  Administered 2011-01-07: 100 ug via INTRAVENOUS
  Administered 2011-01-07: 50 ug via INTRAVENOUS
  Administered 2011-01-07 (×3): 250 ug via INTRAVENOUS
  Administered 2011-01-07: 350 ug via INTRAVENOUS

## 2011-01-07 MED ORDER — ONDANSETRON HCL 4 MG/2ML IJ SOLN
4.0000 mg | Freq: Four times a day (QID) | INTRAMUSCULAR | Status: DC | PRN
Start: 2011-01-07 — End: 2011-01-10

## 2011-01-07 MED ORDER — SODIUM CHLORIDE 0.9 % IV SOLN
10.0000 g | INTRAVENOUS | Status: DC | PRN
  Administered 2011-01-07: 5 g/h via INTRAVENOUS

## 2011-01-07 MED ORDER — BISACODYL 5 MG PO TBEC
10.0000 mg | DELAYED_RELEASE_TABLET | Freq: Every day | ORAL | Status: DC
  Administered 2011-01-08 – 2011-01-09 (×2): 10 mg via ORAL
  Filled 2011-01-07 (×2): qty 2

## 2011-01-07 MED ORDER — ALBUMIN HUMAN 5 % IV SOLN
INTRAVENOUS | Status: DC | PRN
  Administered 2011-01-07 (×2): via INTRAVENOUS

## 2011-01-07 MED ORDER — ROCURONIUM BROMIDE 100 MG/10ML IV SOLN
INTRAVENOUS | Status: DC | PRN
  Administered 2011-01-07: 50 mg via INTRAVENOUS
  Administered 2011-01-07: 5 mg via INTRAVENOUS

## 2011-01-07 MED ORDER — METOPROLOL TARTRATE 25 MG/10 ML ORAL SUSPENSION
12.5000 mg | Freq: Two times a day (BID) | ORAL | Status: DC
  Filled 2011-01-07 (×3): qty 5

## 2011-01-07 SURGICAL SUPPLY — 93 items
ADAPTER CARDIO PERF ANTE/RETRO (ADAPTER) ×3 IMPLANT
ADPR PRFSN 84XANTGRD RTRGD (ADAPTER) ×2
APPLICATOR COTTON TIP 6IN STRL (MISCELLANEOUS) IMPLANT
ATTRACTOMAT 16X20 MAGNETIC DRP (DRAPES) ×3 IMPLANT
BAG DECANTER FOR FLEXI CONT (MISCELLANEOUS) ×3 IMPLANT
BLADE SAW STERNAL (BLADE) ×3 IMPLANT
BLADE SURG 11 STRL SS (BLADE) IMPLANT
BLADE SURG 15 STRL LF DISP TIS (BLADE) ×2 IMPLANT
BLADE SURG 15 STRL SS (BLADE) ×3
BOOT SUTURE AID YELLOW STND (SUTURE) ×1 IMPLANT
CANISTER SUCTION 2500CC (MISCELLANEOUS) ×3 IMPLANT
CANNULA GUNDRY RCSP 15FR (MISCELLANEOUS) IMPLANT
CANNULA VENOUS DUAL 32/40 (CANNULA) IMPLANT
CATH HEART VENT LEFT (CATHETERS) ×2 IMPLANT
CATH RETROPLEGIA CORONARY 14FR (CATHETERS) ×3 IMPLANT
CATH/SQUID NICHOLS JEHLE COR (CATHETERS) IMPLANT
CLIP FOGARTY SPRING 6M (CLIP) IMPLANT
CLOTH BEACON ORANGE TIMEOUT ST (SAFETY) ×3 IMPLANT
CONN 1/2X1/2X1/2  BEN (MISCELLANEOUS) ×1
CONN 1/2X1/2X1/2 BEN (MISCELLANEOUS) ×1 IMPLANT
COVER SURGICAL LIGHT HANDLE (MISCELLANEOUS) ×6 IMPLANT
CRADLE DONUT ADULT HEAD (MISCELLANEOUS) ×3 IMPLANT
DRAIN CHANNEL 28F RND 3/8 FF (WOUND CARE) ×4 IMPLANT
DRAIN CHANNEL 32F RND 10.7 FF (WOUND CARE) IMPLANT
DRAPE CARDIOVASCULAR INCISE (DRAPES) ×3
DRAPE SLUSH MACHINE 52X66 (DRAPES) ×3 IMPLANT
DRAPE SLUSH/WARMER DISC (DRAPES) IMPLANT
DRAPE SRG 135X102X78XABS (DRAPES) ×2 IMPLANT
DRSG COVADERM 4X14 (GAUZE/BANDAGES/DRESSINGS) ×3 IMPLANT
ELECT BLADE 4.0 EZ CLEAN MEGAD (MISCELLANEOUS) ×3
ELECT CAUTERY BLADE 6.4 (BLADE) ×2 IMPLANT
ELECT REM PT RETURN 9FT ADLT (ELECTROSURGICAL) ×9
ELECTRODE BLDE 4.0 EZ CLN MEGD (MISCELLANEOUS) ×2 IMPLANT
ELECTRODE REM PT RTRN 9FT ADLT (ELECTROSURGICAL) ×5 IMPLANT
GLOVE BIO SURGEON STRL SZ 6.5 (GLOVE) ×27 IMPLANT
GOWN STRL NON-REIN LRG LVL3 (GOWN DISPOSABLE) ×28 IMPLANT
HEMOSTAT POWDER SURGIFOAM 1G (HEMOSTASIS) ×8 IMPLANT
HEMOSTAT SURGICEL 2X14 (HEMOSTASIS) ×2 IMPLANT
INSERT FOGARTY XLG (MISCELLANEOUS) IMPLANT
KIT BASIN OR (CUSTOM PROCEDURE TRAY) ×3 IMPLANT
KIT PAIN CUSTOM (MISCELLANEOUS) IMPLANT
KIT ROOM TURNOVER OR (KITS) ×3 IMPLANT
KIT SUCTION CATH 14FR (SUCTIONS) IMPLANT
LEAD PACING MYOCARDI (MISCELLANEOUS) IMPLANT
LINE VENT (MISCELLANEOUS) ×2 IMPLANT
NDL 18GX1X1/2 (RX/OR ONLY) (NEEDLE) ×1 IMPLANT
NEEDLE 18GX1X1/2 (RX/OR ONLY) (NEEDLE) ×3 IMPLANT
NS IRRIG 1000ML POUR BTL (IV SOLUTION) ×19 IMPLANT
PACK OPEN HEART (CUSTOM PROCEDURE TRAY) ×3 IMPLANT
PAD ARMBOARD 7.5X6 YLW CONV (MISCELLANEOUS) ×3 IMPLANT
SET CARDIOPLEGIA MPS 5001102 (MISCELLANEOUS) ×2 IMPLANT
SPONGE GAUZE 4X4 12PLY (GAUZE/BANDAGES/DRESSINGS) ×4 IMPLANT
SPONGE GAUZE 4X4 FOR O.R. (GAUZE/BANDAGES/DRESSINGS) ×2 IMPLANT
STOPCOCK 4 WAY LG BORE MALE ST (IV SETS) IMPLANT
SUT ETHIBON 2 0 V 52N 30 (SUTURE) ×6 IMPLANT
SUT ETHIBON EXCEL 2-0 V-5 (SUTURE) IMPLANT
SUT ETHIBOND 2 0 SH (SUTURE)
SUT ETHIBOND 2 0 SH 36X2 (SUTURE) IMPLANT
SUT ETHIBOND 2 0 V4 (SUTURE) IMPLANT
SUT ETHIBOND 2 0V4 GREEN (SUTURE) IMPLANT
SUT ETHIBOND V-5 VALVE (SUTURE) IMPLANT
SUT PROLENE 3 0 RB 1 (SUTURE) ×2 IMPLANT
SUT PROLENE 3 0 SH 1 (SUTURE) IMPLANT
SUT PROLENE 4 0 RB 1 (SUTURE) ×18
SUT PROLENE 4-0 RB1 .5 CRCL 36 (SUTURE) ×6 IMPLANT
SUT PROLENE 5 0 C 1 36 (SUTURE) ×6 IMPLANT
SUT SILK  1 MH (SUTURE) ×2
SUT SILK 1 MH (SUTURE) ×4 IMPLANT
SUT SILK 1 TIES 10X30 (SUTURE) ×1 IMPLANT
SUT SILK 2 0 (SUTURE)
SUT SILK 2 0 SH CR/8 (SUTURE) ×4 IMPLANT
SUT SILK 2-0 18XBRD TIE 12 (SUTURE) ×1 IMPLANT
SUT SILK 3 0 SH CR/8 (SUTURE) ×1 IMPLANT
SUT SILK 4 0 (SUTURE)
SUT SILK 4-0 18XBRD TIE 12 (SUTURE) ×1 IMPLANT
SUT STEEL 6MS V (SUTURE) ×4 IMPLANT
SUT STEEL SZ 6 DBL 3X14 BALL (SUTURE) ×2 IMPLANT
SUT TEM PAC WIRE 2 0 SH (SUTURE) ×6 IMPLANT
SUT VIC AB 1 CTX 18 (SUTURE) ×4 IMPLANT
SUT VIC AB 2-0 CTX 27 (SUTURE) IMPLANT
SUT VIC AB 3-0 X1 27 (SUTURE) IMPLANT
SUTURE E-PAK OPEN HEART (SUTURE) ×2 IMPLANT
SYSTEM SAHARA CHEST DRAIN ATS (WOUND CARE) ×3 IMPLANT
TAPE CLOTH SURG 4X10 WHT LF (GAUZE/BANDAGES/DRESSINGS) ×2 IMPLANT
TAPES RETRACTO (MISCELLANEOUS) ×1 IMPLANT
TOWEL OR 17X24 6PK STRL BLUE (TOWEL DISPOSABLE) ×3 IMPLANT
TOWEL OR 17X26 10 PK STRL BLUE (TOWEL DISPOSABLE) ×3 IMPLANT
TRAY FOLEY IC TEMP SENS 16FR (CATHETERS) ×3 IMPLANT
TUBE FEEDING 8FR 16IN STR KANG (MISCELLANEOUS) ×3 IMPLANT
UNDERPAD 30X30 INCONTINENT (UNDERPADS AND DIAPERS) ×3 IMPLANT
VALVE MAGNA EASE AORTIC 23MM (Prosthesis & Implant Heart) ×2 IMPLANT
VENT LEFT HEART 12002 (CATHETERS) ×3
WATER STERILE IRR 1000ML POUR (IV SOLUTION) ×6 IMPLANT

## 2011-01-07 NOTE — Progress Notes (Signed)
  Stable early postop.  Good index, normal PA pressures  Good UO Minimal bleeding  Hypokalemia treated.  Should be able to extubate soon

## 2011-01-07 NOTE — Transfer of Care (Signed)
Immediate Anesthesia Transfer of Care Note  Patient: Ronald [name]  Procedure(s) Performed:  AORTIC VALVE REPLACEMENT (AVR)  Patient Location: SICU  Anesthesia Type: General  Level of Consciousness: sedated and Patient remains intubated per anesthesia plan  Airway & Oxygen Therapy: Patient remains intubated per anesthesia plan  Post-op Assessment: report given to SICU RN  Post vital signs: Reviewed and stable  Complications: No apparent anesthesia complications

## 2011-01-07 NOTE — Procedures (Signed)
Extubation Procedure Note  Patient Details:   Name: Ronald [name] DOB: 29-Jul-1922 MRN: [id number]   Airway Documentation:  AIRWAYS 8.5 mm (Active)  Secured at (cm) 23 cm 01/07/2011 12:00 AM     Airway 8.5 mm (Active)  Secured at (cm) 23 cm 01/07/2011  4:23 PM  Measured From Lips 01/07/2011  4:23 PM  Secured Location Right 01/07/2011  4:23 PM  Secured By Caron Presume Tape 01/07/2011  4:23 PM    Evaluation  O2 sats: stable throughout Complications: No apparent complications Patient did tolerate procedure well. Bilateral Breath Sounds: Diminished Suctioning: Airway Yes  Newt Lukes 01/07/2011, 5:31 PM

## 2011-01-07 NOTE — Anesthesia Procedure Notes (Signed)
Procedures: Right IJ Theone Murdoch Catheter Insertion: The patient was identified and consent obtained.  TO was performed, and full barrier precautions were used.  The skin was anesthetized with lidocaine-4cc plain with 25g needle.  Once the vein was located with the 22 ga. needle using ultrasound guidance , the wire was inserted into the vein.  The wire location was confirmed with ultrasound.  The tissue was dilated and the 8.5 [place] cordis catheter was carefully inserted. Afterwards Theone Murdoch catheter was inserted. PA catheter at 47cm.  The patient tolerated the procedure well.

## 2011-01-07 NOTE — Interval H&P Note (Signed)
History and Physical Interval Note:   01/07/2011   7:32 AM   Ronald [name]  has presented today for surgery, with the diagnosis of aortic stenosis  The various methods of treatment have been discussed with the patient and family. After consideration of risks, benefits and other options for treatment, the patient has consented to  Procedure(s): AORTIC VALVE REPLACEMENT (AVR) MAZE as a surgical intervention .  The patients' history has been reviewed, patient examined, no change in status, stable for surgery.  I have reviewed the patients' chart and labs.  Questions were answered to the patient's satisfaction.   Marland Kitchenlast Lab Results  Component Value Date   WBC 8.8 01/03/2011   HGB 13.3 01/03/2011   HCT 37.6* 01/03/2011   PLT 109* 01/03/2011   GLUCOSE 223* 01/03/2011   ALT 20 01/03/2011   AST 14 01/03/2011   NA 138 01/03/2011   K 3.8 01/03/2011   CL 101 01/03/2011   CREATININE 1.18 01/03/2011   BUN 26* 01/03/2011   CO2 24 01/03/2011   INR 1.10 01/03/2011   HGBA1C 8.0* 01/03/2011   last week the patient developed some eczema-type rash in the right axilla he started treating this with a previous cream that he had an he was seen yesterday by Dr. been tried it did not appear infected this morning this is again examined. The area of rashes in the right axilla and is not in any way close to the incision site. We've decided to proceed with planned surgery. In addition the patient was noted to be in atrial fibrillation on his EKG I discussed with him the possibility of maze procedure concomitantly with his aortic valve replacement and he is agreeable to this.   Ronald Ovens MD 01/07/2011 7:45 AM

## 2011-01-07 NOTE — Preoperative (Signed)
Beta Blockers   Reason not to administer Beta Blockers:metoprolol taken@0530  am per pt. statement.

## 2011-01-07 NOTE — Anesthesia Preprocedure Evaluation (Signed)
Anesthesia Evaluation  Patient identified by MRN, date of birth, ID band Patient awake    Reviewed: Allergy & Precautions, H&P , NPO status , Patient's Chart, lab work & pertinent test results, reviewed documented beta blocker date and time   History of Anesthesia Complications Negative for: history of anesthetic complications  Airway Mallampati: I TM Distance: >3 FB Neck ROM: Full    Dental  (+) Teeth Intact   Pulmonary neg pulmonary ROS,  clear to auscultation  Pulmonary exam normal       Cardiovascular hypertension, + CAD + Valvular Problems/Murmurs Irregular Abnormal- Systolic murmurs    Neuro/Psych PSYCHIATRIC DISORDERS Negative Neurological ROS     GI/Hepatic negative GI ROS, Neg liver ROS,   Endo/Other  Diabetes mellitus-  Renal/GU      Musculoskeletal   Abdominal   Peds  Hematology   Anesthesia Other Findings   Reproductive/Obstetrics                           Anesthesia Physical Anesthesia Plan  ASA: III  Anesthesia Plan: General   Post-op Pain Management:    Induction: Intravenous  Airway Management Planned: Oral ETT  Additional Equipment: Arterial line, PA Cath and TEE  Intra-op Plan:   Post-operative Plan: Post-operative intubation/ventilation  Informed Consent: I have reviewed the patients History and Physical, chart, labs and discussed the procedure including the risks, benefits and alternatives for the proposed anesthesia with the patient or authorized representative who has indicated his/her understanding and acceptance.   Dental advisory given  Plan Discussed with: CRNA and Surgeon  Anesthesia Plan Comments:         Anesthesia Quick Evaluation

## 2011-01-07 NOTE — Progress Notes (Signed)
Spoke with Dr Morton Peters    No diazepam is to be given this am

## 2011-01-07 NOTE — Op Note (Signed)
NAMEMarland [name]  Ronald, [name] NO.:  [id number]  MEDICAL RECORD NO.:  [id number]  LOCATION:  2311                         FACILITY:  [facility]  PHYSICIAN:  Sheliah Plane, MD    DATE OF BIRTH:  May 09, 1922  DATE OF PROCEDURE:  01/07/2011 DATE OF DISCHARGE:                              OPERATIVE REPORT   PREOPERATIVE DIAGNOSIS:  Critical aortic stenosis, atrial fibrillation.  POSTOPERATIVE DIAGNOSIS:  Critical aortic stenosis, atrial fibrillation.  SURGICAL PROCEDURE:  Aortic valve replacement with a 23 mm pericardial tissue valve, [employer] model 3300 TFX, serial [id number] and placement of left atrial clip.  SURGEON:  Sheliah Plane, MD  FIRST ASSISTANT:  Cecile Sheerer, SA  BRIEF HISTORY:  The patient is an 77 year old active male with known progressive aortic stenosis.  The patient had been having increasing fatigue and exertional  weakness and lightheadedness but without frank syncope.  Cardiac catheterizations showed aortic valve area 0.8 without significant coronary artery disease.  Because of the patient's critical aortic stenosis and symptoms and very good functional status, aortic valve replacement was recommended.  The patient agreed and signed informed consent.  DESCRIPTION OF PROCEDURE:  With Swan-Ganz, arterial line and monitors were placed.  The patient underwent general endotracheal anesthesia without incident.  Skin of chest and legs prepped with Betadine and draped in usual sterile manner.  A transesophageal echo probe was placed by Dr. Salomon Mast, confirming critical aortic stenosis with preserved LV function and evidence of left ventricular hypertrophy.  After appropriate time-out, a median sternotomy was performed.  Pericardium was opened.  Overall ventricular function appeared preserved.  The patient did have a small amount of pericardial effusion, non bloody. The patient was systemically heparinized.  The ascending aorta was cannulated.  The  right atrium was cannulated and retrograde cardioplegia catheter was placed through a separate site in the right atrium into the coronary sinus.  The patient was placed on cardiopulmonary bypass 2.4 L/minute per meter squared.  Right superior pulmonary vein vent was placed.  The patient's body temp was cooled to 32 degrees.  Aortic crossclamp was applied, 1000 mL of cold blood potassium cardioplegia was administered antegrade, additional cold blood cardioplegia was administered retrograde.  Because of the patient's  history of atrial fibrillation and being a poor candidate for Coumadin therapy, it was decided to place an atrial clip on the base of left atrial appendage which was measured at approximately 48 mm and 15 mm clip was applied without difficulty.  Attention was then turned back to the aortic root. A transverse aortotomy was performed.  This gave good exposure to a tricuspid highly calcified aortic valve.  The valve was excised.  The anulus was debrided with a special care to remove all loose calcific debris.  Anulus was sized for 23 pericardial tissue valve.  # 2 Tycron pledgeted sutures were placed circumferentially in the aortic root, and used to secure the [facility] Ease pericardial tissue valve in place.  The valve seated well.  There was no impingement on the right or left coronary ostium.  During the procedure, intermittent cold blood cardioplegia was administered retrograde.  Topical cold was also used. With the valve well seated, the  aortotomy was then closed with a horizontal mattress 4-0 Prolene over felt strips.  Before complete closure of the aorta, the heart was allowed to passively fill and de- air.  A second layer over and over and 4-0 Prolene was placed.  Aortic cross-clamp was removed with a total crossclamp time of 84 minutes.  The patient required electric defibrillation returned to a slow sinus rhythm.  Atrial and ventricular pacing wires were placed and the  patient was AV sequentially paced.  The aortic root vent was left in place to further de-air the heart with the patient's body temperature rewarmed to 37 degrees.  The right superior pulmonary vein vent was removed.  The retrograde cardioplegia catheter was removed.  The TEE showed good function of the aortic valve.  The patient was ventilated and weaned from cardiopulmonary bypass without difficulty.  He remained hemodynamically stable.  He was decannulated in usual fashion. Protamine sulfate was administered.  The patient had a previous history of mild thrombocytopenia though while on bypass his platelet count remained 89,000.  Operative field was hemostatic, so no blood products were given.  Two Blake mediastinal  drains were left in place. Pericardium was reapproximated.  Sternum closed with 6 stainless steel wire.  Fascia closed with interrupted 0 Vicryl, running 3-0 Vicryl in subcutaneous tissue, 4-0 subcuticular stitch in skin edges.  Dry dressings were applied.  Sponge and needle count was reported as correct at the completion of procedure.  The patient tolerated the procedure without obvious complication.  Transferred to Surgical Intensive Care Unit for further postoperative care.     Sheliah Plane, MD     EG/MEDQ  D:  01/07/2011  T:  01/07/2011  Job:  [id number]

## 2011-01-07 NOTE — Brief Op Note (Signed)
01/07/2011  12:29 PM  PATIENT:  Ronald [name]  77 y.o. male  PRE-OPERATIVE DIAGNOSIS:  aortic stenosis  POST-OPERATIVE DIAGNOSIS:  aortic stenosis  PROCEDURE:  Procedure(s): AORTIC VALVE REPLACEMENT (AVR) with #23 Magna Ease Edwards Pericardial Valve  Left Atrial Clip  SURGEON:  Surgeon(s): Delight Ovens, MD  PHYSICIAN ASSISTANT:   ASSISTANTS: Cecile Sheerer SA  ANESTHESIA:   general  EBL:  Total I/O In: 3443 [I.V.:2800; Blood:643] Out: 400 [Urine:400]  BLOOD ADMINISTERED:none only cell saver  DRAINS: (2) Blake drain(s) in the Pericardium   LOCAL MEDICATIONS USED:  NONE  SPECIMEN:  Source of Specimen:  aortic valve  DISPOSITION OF SPECIMEN:  PATHOLOGY  COUNTS:  YES  TOURNIQUET:  * No tourniquets in log *  DICTATION: .Other Dictation: Dictation Number [id number]  PLAN OF CARE: Admit to inpatient   PATIENT DISPOSITION:  ICU - intubated and hemodynamically stable.   Delay start of Pharmacological VTE agent (>24hrs) due to surgical blood loss or risk of bleeding: yes

## 2011-01-07 NOTE — Progress Notes (Signed)
  Echocardiogram Echocardiogram Transesophageal has been performed.  Margreta [facility] 01/07/2011, 9:31 AM

## 2011-01-07 NOTE — H&P (View-Only) (Signed)
[address]            [place] [address]          [phone]       MARSDEN ZAINO [facility] Medical Record #[id number] Date of Birth: July 30, 1922  Referring: Dr Allyson Sabal Primary Care: [name],    Chief Complaint   Patient presents with   .  Aortic Stenosis     Referral from Dr Allyson Sabal for surgical eval, AVR consult   HPI:  Patient is a 77 year old male who is referred by Dr. York Ram for consideration of aortic valve replacement. The patient notes increasing fatigue with exertion weakness in his arms and legs some episodes of lightheadedness but no frank syncope. He does note some exertional shortness of breath with exertion but denies frank anginal symptoms. He has no nocturnal dyspnea. Denies pedal edema. He currently cares for himself drives and works on a daily basis. He does have known peripheral vascular disease oral agent controlled diabetes for 30 years and renal vascular disease with a stent in his left renal artery. He has been a long-term smoker and continues to smoke currently. He denies any frequent pneumonia or respiratory difficulty. He comes in today to discuss aortic valve replacement.    Past Medical History   Diagnosis  Date   .  AS (aortic stenosis)    .  DM2 (diabetes mellitus, type 2)    .  HTN (hypertension)    .  Heart murmur    .  Thrombocytopenia due to drugs      seen by [name] plts [id number] no rx   .  Cancer      Bladder Cancer local   .  Neuropathy due to secondary diabetes     Past Surgical History   Procedure  Date   .  Renal artery stent      2006 left renal stent   .  Peripheral arterial stent graft      2006 left anf right illiac stents Dr Orson Slick    Family History   Problem  Relation  Age of Onset   .  Heart disease  Mother    .  Cancer  Brother    .  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  His wife expired 4 years ago proximally year after having bypass surgery from rhythm complications. Patient currently lives alone and cares for himself.  History   Substance Use Topics   .  Smoking status:  Current Everyday Smoker -- 2.0 packs/day     Types:  Cigarettes   .  Smokeless tobacco:  Not on file   .  Alcohol Use:  Yes      2 beers a week    Current Outpatient Prescriptions   Medication  Sig  Dispense  Refill   .  amLODipine (NORVASC) 10 MG tablet  Take 10 mg by mouth daily.     .  benazepril-hydrochlorthiazide (LOTENSIN HCT) 20-12.5 MG per tablet  Take 1 tablet by mouth 2 (two) times daily.     .  celecoxib (CELEBREX) 200 MG capsule  Take 200 mg by mouth daily. PRN  On hold   .  clopidogrel (PLAVIX) 75 MG tablet  Take 75 mg by mouth daily.  On hold   .  doxazosin (CARDURA) 4 MG tablet  Take 4 mg by mouth at bedtime.     .  finasteride (PROSCAR) 5 MG tablet  Take 5 mg by mouth daily.     .  furosemide (LASIX) 20 MG tablet  Take 20 mg by mouth daily.     Marland Kitchen  glyBURIDE-metformin (GLUCOVANCE) 5-500 MG per tablet  Take 1 tablet by mouth 2 (two) times daily with a meal. 1/2 tablet po bid     .  loratadine (CLARITIN) 10 MG tablet  Take 10 mg by mouth daily.     .  metoprolol (LOPRESSOR) 100 MG tablet  Take 100 mg by mouth 1 day or 1 dose.     .  potassium chloride SA (K-DUR,KLOR-CON) 20 MEQ tablet  Take 20 mEq by mouth daily.     .  simvastatin (ZOCOR) 20 MG tablet  Take 20 mg by mouth at bedtime.      Allergies   Allergen  Reactions   .  Aspirin    Review of Systems  Constitutional: Positive for activity change and fatigue. Negative for chills, diaphoresis and appetite change.  HENT: Positive for neck pain.  Eyes: Negative.  Respiratory: Positive for shortness of breath. Negative for wheezing and stridor.  Cardiovascular: Positive for palpitations. Negative for chest pain and leg swelling.  Gastrointestinal: Negative.  Genitourinary: Positive for frequency. Negative for  hematuria and difficulty urinating.  Musculoskeletal: Positive for back pain. Negative for arthralgias.  Neurological: Positive for dizziness, weakness, light-headedness and numbness. Negative for seizures and headaches.  Hematological: Negative for adenopathy. Does not bruise/bleed easily.  Psychiatric/Behavioral: Negative.     Physical Exam   BP 108/69  Pulse 84  Resp 20  Ht 5\' 11"  (1.803 m)  Wt 180 lb (81.647 kg)  BMI 25.10 kg/m2  SpO2 98%  Constitutional: He is oriented to person, place, and time. He appears well-nourished. No distress.  HENT:  Mouth/Throat: No oropharyngeal exudate.  Eyes: No scleral icterus.  Neck: Neck supple. No JVD present. No tracheal deviation present. No thyromegaly present.  Cardiovascular: Normal rate, regular rhythm, S1 normal and S2 normal. PMI is not displaced. Exam reveals no gallop, no S3, no S4 and no friction rub.  Murmur heard.  Crescendo systolic murmur is present with a grade of 4/6  No diastolic murmur is present  Pulses:  Carotid pulses are 2+ on the right side, and 2+ on the left side.  Radial pulses are 2+ on the right side, and 2+ on the left side.  Femoral pulses are 2+ on the right side with bruit, and 2+ on the left side with bruit.  Popliteal pulses are 2+ on the right side, and 2+ on the left side.  Dorsalis pedis pulses are 0 on the right side, and 0 on the left side.  Posterior tibial pulses are 0 on the right side, and 0 on the left side.  Pulmonary/Chest: No stridor. No respiratory distress. He has no wheezes. He has no rales. He exhibits no tenderness.  Abdominal: Soft. Bowel sounds are normal.  Musculoskeletal: He exhibits no edema and no tenderness.  Lymphadenopathy:  He has no cervical adenopathy.  Neurological: He is alert and oriented to person, place, and time. No cranial nerve deficit.  Skin: Skin is warm and dry. He is not diaphoretic.  Venous statis changes both lower legs  Psychiatric: He has a normal mood and  affect. His behavior is normal. Judgment and thought content normal.  Diagnostic Tests:  Lab Results   Component  Value  Date    WBC  11.7*  11/20/2010    HGB  12.4*  11/20/2010    HCT  35.0*  11/20/2010    PLT  98*  11/20/2010    GLUCOSE  145*  11/20/2010    ALT  22  02/12/2010    AST  15  02/12/2010    NA  138  11/20/2010    K  3.5  11/20/2010    CL  103  11/20/2010    CREATININE  0.94  11/20/2010    BUN  20  11/20/2010    CO2  27  11/20/2010    PFTs were performed which demonstrated an FEV1 of 1.9   67.2% of predicted with mild obstructive airway disease      Cath 11/19/2010  HEMODYNAMICS:  1. Aortic systolic pressure 144, diastolic pressure 58.  2. Left ventricular systolic pressure 188, end-diastolic pressure 18.  3. RA pressure A-wave 10, V-wave 10, mean 8.  4. RV pressure systolic 31 and diastolic pressure 8.  5. Pulmonary artery pressure A-wave 31, systolic 31, diastolic  pressure 14, mean 24.  6. Pulmonary capillary wedge pressure A-wave 19, V-wave 30, mean 20.  7. Cardiac output by Fick was 5.7 L r minute with an index of 2.8 L  per minute/m2.  8. Right thermodilution 4.8 L per minute with an index of 2.4 L per  minute/m2. The aortic valve gradient measured by pullback was 44  mmHg. The aortic valve area by Fick was 0.7 cm2 and by  thermodilution 0.89 cm2.  SELECTIVE CORONARY ANGIOGRAPHY:  1. Left main normal.  2. LAD normal.  3. Left circumflex normal.  4. Right coronary artery is dominant. Normal.  5. Left ventriculography was not performed.  6. Distal abdominal aortography; distal abdominal aortogram was  performed using 20 mL of Visipaque dye at 20 mL per second x2.  There was a 75% "in-stent restenosis" within the left renal artery  stent. The infrarenal abdominal aorta was free of systemic  atherosclerotic changes. Both iliac artery stents widely patent  Angiographically.    Impression:  #1 critical aortic stenosis symptomatic  #2 peripheral vascular  disease and renal vascular disease  History of cerebrovascular disease with 40-60% bilateral carotid stenosis  #3 history of diabetes with neuropathy  #4 history of thrombocytopenia    Plan:  I've discussed with the patient the diagnosis of critical aortic stenosis which appears to be becoming symptomatic. The risks of aortic valve replacement including death infection stroke myocardial infarction bleeding and blood transfusion were all discussed. I discussed specifically with the patient increased risk because of his age. However he remains very functional. Prior to making a final decision about proceeding with aortic valve replacement of made arrangements for him to have pulmonary function studies performed.  The outcomes of treatment of critical aortic stenosis in symptomatic patients medically is discussed with the patient also.  I discussed with them recent problems with TVAR. Both the pros and cons. At this point I would  deem the patient an operable candidate from the standard approach of aortic valve replacement. The expectation of medical treatment for critical AS is reviewed with the patient and is poor.   He wishes to proceed. His Celebrex and Plavix have been discontinued. We plan to proceed with aortic valve replacement November 12.  The goals, risks and alternatives of the planned surgical procedure AVR have been discussed with the patient in detail.  The risks of the procedure including death, infection, stroke, myocardial infarction, bleeding, blood transfusion have all been discussed specifically. I have quoted Margaret Pyle a 5% of perioperative mortality and a complication rate as high as 25 %. The patient's questions have been answered.JAMAINE QUINTIN is willing to proceed with the planned procedure.

## 2011-01-08 ENCOUNTER — Inpatient Hospital Stay (HOSPITAL_COMMUNITY): Payer: Medicare Other

## 2011-01-08 DIAGNOSIS — Z952 Presence of prosthetic heart valve: Secondary | ICD-10-CM

## 2011-01-08 LAB — CBC
HCT: 29.5 % — ABNORMAL LOW (ref 39.0–52.0)
HCT: 31.8 % — ABNORMAL LOW (ref 39.0–52.0)
Hemoglobin: 10.4 g/dL — ABNORMAL LOW (ref 13.0–17.0)
Hemoglobin: 11.2 g/dL — ABNORMAL LOW (ref 13.0–17.0)
MCH: 31.9 pg (ref 26.0–34.0)
MCH: 32.3 pg (ref 26.0–34.0)
MCHC: 35.3 g/dL (ref 30.0–36.0)
MCHC: 35.2 g/dL (ref 30.0–36.0)
MCV: 90.5 fL (ref 78.0–100.0)
MCV: 91.6 fL (ref 78.0–100.0)
Platelets: 65 10*3/uL — ABNORMAL LOW (ref 150–400)
Platelets: 67 10*3/uL — ABNORMAL LOW (ref 150–400)
RBC: 3.26 MIL/uL — ABNORMAL LOW (ref 4.22–5.81)
RBC: 3.47 MIL/uL — ABNORMAL LOW (ref 4.22–5.81)
RDW: 13.8 % (ref 11.5–15.5)
RDW: 14.4 % (ref 11.5–15.5)
WBC: 20.4 10*3/uL — ABNORMAL HIGH (ref 4.0–10.5)
WBC: 23.2 10*3/uL — ABNORMAL HIGH (ref 4.0–10.5)

## 2011-01-08 LAB — GLUCOSE, CAPILLARY
Glucose-Capillary: 102 mg/dL — ABNORMAL HIGH (ref 70–99)
Glucose-Capillary: 103 mg/dL — ABNORMAL HIGH (ref 70–99)
Glucose-Capillary: 104 mg/dL — ABNORMAL HIGH (ref 70–99)
Glucose-Capillary: 104 mg/dL — ABNORMAL HIGH (ref 70–99)
Glucose-Capillary: 104 mg/dL — ABNORMAL HIGH (ref 70–99)
Glucose-Capillary: 107 mg/dL — ABNORMAL HIGH (ref 70–99)
Glucose-Capillary: 107 mg/dL — ABNORMAL HIGH (ref 70–99)
Glucose-Capillary: 108 mg/dL — ABNORMAL HIGH (ref 70–99)
Glucose-Capillary: 110 mg/dL — ABNORMAL HIGH (ref 70–99)
Glucose-Capillary: 112 mg/dL — ABNORMAL HIGH (ref 70–99)
Glucose-Capillary: 112 mg/dL — ABNORMAL HIGH (ref 70–99)
Glucose-Capillary: 119 mg/dL — ABNORMAL HIGH (ref 70–99)
Glucose-Capillary: 122 mg/dL — ABNORMAL HIGH (ref 70–99)
Glucose-Capillary: 132 mg/dL — ABNORMAL HIGH (ref 70–99)
Glucose-Capillary: 157 mg/dL — ABNORMAL HIGH (ref 70–99)
Glucose-Capillary: 177 mg/dL — ABNORMAL HIGH (ref 70–99)
Glucose-Capillary: 183 mg/dL — ABNORMAL HIGH (ref 70–99)
Glucose-Capillary: 261 mg/dL — ABNORMAL HIGH (ref 70–99)
Glucose-Capillary: 284 mg/dL — ABNORMAL HIGH (ref 70–99)
Glucose-Capillary: 90 mg/dL (ref 70–99)
Glucose-Capillary: 94 mg/dL (ref 70–99)
Glucose-Capillary: 95 mg/dL (ref 70–99)
Glucose-Capillary: 96 mg/dL (ref 70–99)
Glucose-Capillary: 99 mg/dL (ref 70–99)

## 2011-01-08 LAB — BASIC METABOLIC PANEL
BUN: 16 mg/dL (ref 6–23)
CO2: 21 mEq/L (ref 19–32)
Calcium: 7.5 mg/dL — ABNORMAL LOW (ref 8.4–10.5)
Chloride: 109 mEq/L (ref 96–112)
Creatinine, Ser: 0.77 mg/dL (ref 0.50–1.35)
GFR calc Af Amer: >90 mL/min (ref >90)
GFR calc non Af Amer: 80 mL/min — ABNORMAL LOW (ref >90)
Glucose, Bld: 105 mg/dL — ABNORMAL HIGH (ref 70–99)
Potassium: 3.2 mEq/L — ABNORMAL LOW (ref 3.5–5.1)
Sodium: 140 mEq/L (ref 135–145)

## 2011-01-08 LAB — CREATININE, SERUM
Creatinine, Ser: 1 mg/dL (ref 0.50–1.35)
GFR calc Af Amer: 76 mL/min — ABNORMAL LOW (ref >90)
GFR calc non Af Amer: 66 mL/min — ABNORMAL LOW (ref >90)

## 2011-01-08 LAB — POCT I-STAT, CHEM 8
BUN: 19 mg/dL (ref 6–23)
Calcium, Ion: 1.14 mmol/L (ref 1.12–1.32)
Chloride: 102 mEq/L (ref 96–112)
Creatinine, Ser: 0.9 mg/dL (ref 0.50–1.35)
Glucose, Bld: 243 mg/dL — ABNORMAL HIGH (ref 70–99)
HCT: 32 % — ABNORMAL LOW (ref 39.0–52.0)
Hemoglobin: 10.9 g/dL — ABNORMAL LOW (ref 13.0–17.0)
Potassium: 3.6 mEq/L (ref 3.5–5.1)
Sodium: 138 mEq/L (ref 135–145)
TCO2: 21 mmol/L (ref 0–100)

## 2011-01-08 LAB — MAGNESIUM
Magnesium: 2.1 mg/dL (ref 1.5–2.5)
Magnesium: 2.3 mg/dL (ref 1.5–2.5)

## 2011-01-08 MED ORDER — METOPROLOL TARTRATE 25 MG/10 ML ORAL SUSPENSION
12.5000 mg | Freq: Two times a day (BID) | ORAL | Status: DC
  Administered 2011-01-09: 12.5 mg
  Filled 2011-01-08 (×6): qty 5

## 2011-01-08 MED ORDER — POTASSIUM CHLORIDE 10 MEQ/50ML IV SOLN
INTRAVENOUS | Status: AC
  Administered 2011-01-08: 10 meq via INTRAVENOUS
  Filled 2011-01-08: qty 50

## 2011-01-08 MED ORDER — SODIUM CHLORIDE 0.9 % IV SOLN
INTRAVENOUS | Status: DC | PRN
  Filled 2011-01-08: qty 1

## 2011-01-08 MED ORDER — INSULIN ASPART 100 UNIT/ML ~~LOC~~ SOLN
0.0000 [IU] | SUBCUTANEOUS | Status: DC
  Administered 2011-01-08: 12 [IU] via SUBCUTANEOUS
  Administered 2011-01-09: 2 [IU] via SUBCUTANEOUS
  Administered 2011-01-09: 12 [IU] via SUBCUTANEOUS
  Filled 2011-01-08 (×2): qty 3

## 2011-01-08 MED ORDER — POTASSIUM CHLORIDE 10 MEQ/50ML IV SOLN
INTRAVENOUS | Status: AC
  Filled 2011-01-08: qty 50

## 2011-01-08 MED ORDER — METOPROLOL TARTRATE 25 MG PO TABS
25.0000 mg | ORAL_TABLET | Freq: Two times a day (BID) | ORAL | Status: DC
  Administered 2011-01-08 – 2011-01-09 (×3): 25 mg via ORAL
  Filled 2011-01-08 (×6): qty 1

## 2011-01-08 MED ORDER — FUROSEMIDE 10 MG/ML IJ SOLN
20.0000 mg | Freq: Two times a day (BID) | INTRAMUSCULAR | Status: DC
  Administered 2011-01-08 (×2): 20 mg via INTRAVENOUS
  Filled 2011-01-08 (×3): qty 2

## 2011-01-08 MED ORDER — POTASSIUM CHLORIDE 10 MEQ/50ML IV SOLN
10.0000 meq | INTRAVENOUS | Status: AC
  Administered 2011-01-08 (×3): 10 meq via INTRAVENOUS
  Filled 2011-01-08: qty 100

## 2011-01-08 MED ORDER — NITROGLYCERIN IN D5W 200-5 MCG/ML-% IV SOLN
0.0000 ug/min | INTRAVENOUS | Status: DC
Start: 2011-01-08 — End: 2011-01-11

## 2011-01-08 MED ORDER — INSULIN GLARGINE 100 UNIT/ML ~~LOC~~ SOLN
12.0000 [IU] | Freq: Every day | SUBCUTANEOUS | Status: DC
Start: 2011-01-08 — End: 2011-01-08
  Administered 2011-01-08: 12 [IU] via SUBCUTANEOUS
  Filled 2011-01-08: qty 3

## 2011-01-08 MED ORDER — INSULIN GLARGINE 100 UNIT/ML ~~LOC~~ SOLN
20.0000 [IU] | Freq: Two times a day (BID) | SUBCUTANEOUS | Status: DC
  Administered 2011-01-08 – 2011-01-09 (×2): 20 [IU] via SUBCUTANEOUS

## 2011-01-08 MED ORDER — POTASSIUM CHLORIDE 10 MEQ/50ML IV SOLN
10.0000 meq | INTRAVENOUS | Status: AC
  Administered 2011-01-08 (×3): 10 meq via INTRAVENOUS

## 2011-01-08 MED FILL — Nitroglycerin IV Soln 200 MCG/ML in D5W: INTRAVENOUS | Qty: 250 | Status: AC

## 2011-01-08 MED FILL — Phenylephrine HCl Inj 10 MG/ML: INTRAMUSCULAR | Qty: 2 | Status: AC

## 2011-01-08 MED FILL — Papaverine HCl Inj 30 MG/ML: INTRAMUSCULAR | Qty: 2 | Status: AC

## 2011-01-08 MED FILL — Cefuroxime Sodium For Inj 750 MG: INTRAMUSCULAR | Qty: 750 | Status: AC

## 2011-01-08 MED FILL — Dopamine in Dextrose 5% Inj 3.2 MG/ML: INTRAVENOUS | Qty: 250 | Status: AC

## 2011-01-08 MED FILL — Dexmedetomidine HCl IV Soln 200 MCG/2ML: INTRAVENOUS | Qty: 4 | Status: AC

## 2011-01-08 MED FILL — Insulin Regular (Human) Inj 100 Unit/ML: INTRAMUSCULAR | Qty: 10 | Status: AC

## 2011-01-08 MED FILL — Vancomycin HCl For IV Soln 1 GM (Base Equivalent): INTRAVENOUS | Qty: 1250 | Status: AC

## 2011-01-08 MED FILL — Magnesium Sulfate Inj 50%: INTRAMUSCULAR | Qty: 2 | Status: AC

## 2011-01-08 MED FILL — Aminocaproic Acid Inj 250 MG/ML: INTRAVENOUS | Qty: 40 | Status: AC

## 2011-01-08 MED FILL — Epinephrine HCl Inj 1 MG/ML: INTRAMUSCULAR | Qty: 4 | Status: AC

## 2011-01-08 MED FILL — Cefuroxime Sodium For Inj 1.5 GM: INTRAMUSCULAR | Qty: 1.5 | Status: AC

## 2011-01-08 MED FILL — Potassium Chloride Inj 2 mEq/ML: INTRAVENOUS | Qty: 40 | Status: AC

## 2011-01-08 MED FILL — Heparin Sodium (Porcine) Inj 5000 Unit/ML: INTRAMUSCULAR | Qty: 1 | Status: AC

## 2011-01-08 NOTE — Progress Notes (Signed)
[facility] BRIEF SICU PROGRESS NOTE  1 Day Post-Op  S/P Procedure(s) (LRB): AORTIC VALVE REPLACEMENT (AVR) (N/A)   Stable day.  NSR. BP stable. UOP adequate.  CBG's increased now back on insulin drip.  Assessment/Plan:  Will increase lantus insulin.  Lynesha Bango H

## 2011-01-08 NOTE — Anesthesia Postprocedure Evaluation (Signed)
  Anesthesia Post-op Note  Patient: Ronald [name]  Procedure(s) Performed:  AORTIC VALVE REPLACEMENT (AVR)  Patient Location: PACU and ICU  Anesthesia Type: General  Level of Consciousness: awake, alert  and oriented  Airway and Oxygen Therapy: Patient Spontanous Breathing  Post-op Pain: none  Post-op Assessment: Post-op Vital signs reviewed and Patient's Cardiovascular Status Stable  Post-op Vital Signs: Reviewed and stable  Complications: No apparent anesthesia complications

## 2011-01-08 NOTE — Op Note (Signed)
NAMEMarland [name]  ISAIS, KLIPFEL NO.:  [id number]  MEDICAL RECORD NO.:  [id number]  LOCATION:  2311                         FACILITY:  [facility]  PHYSICIAN:  Burna Forts, M.D.DATE OF BIRTH:  [birth year]  DATE OF PROCEDURE:  01/07/2011 DATE OF DISCHARGE:                              OPERATIVE REPORT   Transesophageal echocardiographic procedure.  INDICATIONS FOR PROCEDURE:  Ronald [name] is an 77 year old gentleman who presents today for aortic valve replacement by Dr. Tyrone Sage.  It is our intention to place a TEE probe for evaluation of cardiac structures and function as well as pre and post valvular replacement.  After routine induction of general anesthesia, the TEE probe was prepared and inserted oropharyngeally, into the stomach, and slightly withdrawn for imaging of the cardiac structures.  PRECARDIOPULMONARY BYPASS TEE EXAMINATION:  Left ventricle:  The left ventricular chamber is seen initially in the short axis view.  There is concentric but mild left ventricular hypertrophy with the measured end-diastolic left ventricular chamber diameter goes up 15 mm.  There is satisfactory to good contractile pattern appreciated, though the patient is in atrial fibrillation.  There is also noted a small posterior effusion appreciated.  Long-axis views again obtained, again revealing good overall contractile pattern and posterior effusion.  Mitral valve:  On a 4-chamber view of the heart, attention is focused on the mitral valve,there can be seen significant annular calcification.  The 2 leaflets are seen easily.  They are also somewhat enhanced due to calcium on the valvular leaflets themselves.  They appeared open satisfactorily during diastolic inflow.  During closure and systole,ans with  with color Doppler interrogation, there are several small regurgitant jets across the mitral valve during mitral closure.  Again multiple views are carried out.  Left atrium:  The left  atrial chamber is seen and is essentially clear. The apex is also seen.  It is mostly clear; however, there is "smoke" seen in the left atrial appendage indicative of slow rouleaux formation of the blood.  Right ventricle, tricuspid valve, and right atrium are normal structures, in size and function.  Aortic valve:  The aortic valve is seen initially in the short axis view.  There are 3 cusps, there is heavy calcium appreciated along the edges of the cusps and on the edges where they approximate.  Again multiple views are carried out.  The significance for here is that the movement of the leaflets is severely restricted.  This is consistent with significant aortic stenosis.  On a long-axis view with the left ventricular outflow tract in part of the long-axis view picture, we can appreciate 1+ aortic insufficiency.  There is also 3+ aortic stenosis on color Doppler examination.  At the level of the aortic valve, the diameter is about 3.5 cm.  At the level of the sino-tubular junction again about 3.7 cm.  There is minimal dilatation of the aorta in the area above the sino-tubular junction.  The patient is placed on cardiopulmonary bypass.  The procedure and operation for replacement was carried out with pericardial_tissue valve._________ .  De-airing maneuvers are carried out.  The patient is rewarmed and separated from cardiopulmonary bypass with the initial attempt.  Post cardiopulmonary bypass TEE examination (limited exam).  Aortic valve:  In the short axis view of the aortic valve, can know be seen the struts of the new replaced valve.  The thin edges of the leaflets of this tissue valve are seen easily.  They open well and close appropriately during both systole and diastole.  There is no obstruction to flow and there is no aortic insufficiency appreciated. This new valve appears seated well and functioning in a entirely satisfactory manner.  Again both short axis and multiple  long-axis images are obtained.  Left ventricle.  The post bypass period is significant for satisfactory to good overall contractile pattern appreciated in the left ventricular chamber.  With time and separation of cardiopulmonary bypass, the left ventricular contractile pattern is improved.  Ultimately, the patient was returned to the Cardiac Intensive Care Unit in stable condition.          ______________________________ Burna Forts, M.D.     JTM/MEDQ  D:  01/07/2011  T:  01/07/2011  Job:  [id number]

## 2011-01-08 NOTE — Progress Notes (Signed)
[facility]  Subjective: No SOB.  Feels pretty good.  Objective: Vital signs in last 24 hours: Temp:  [95.7 F (35.4 C)-99.3 F (37.4 C)] 98.8 F (37.1 C) (11/13 1000) Pulse Rate:  [76-89] 76  (11/13 1000) Resp:  [12-26] 21  (11/13 1000) BP: (92-134)/(41-83) 107/50 mmHg (11/13 1000) SpO2:  [94 %-100 %] 97 % (11/13 1000) FiO2 (%):  [39.7 %-50.5 %] 40 % (11/12 1715) Weight:  [89.54 kg (197 lb 6.4 oz)] 197 lb 6.4 oz (89.54 kg) (11/13 0515)    Intake/Output from previous day: 11/12 0701 - 11/13 0700 In: 5869 [P.O.:200; I.V.:3376; Blood:643; IV Piggyback:1650] Out: 2160 [Urine:1690; Emesis/NG output:100; Chest Tube:370] Intake/Output this shift: Total I/O In: 827.1 [P.O.:520; I.V.:257.1; IV Piggyback:50] Out: 135 [Urine:75; Chest Tube:60]  Medications Current Facility-Administered Medications  Medication Dose Route Frequency Provider Last Rate Last Dose  . 0.45 % sodium chloride infusion   Intravenous Continuous Delight Ovens, MD 20 mL/hr at 01/08/11 0500    . 0.9 %  sodium chloride infusion   Intravenous Continuous Delight Ovens, MD 20 mL/hr at 01/08/11 0400    . 0.9 %  sodium chloride infusion  250 mL Intravenous Continuous Delight Ovens, MD      . acetaminophen (TYLENOL) solution 650 mg  650 mg Per Tube NOW Delight Ovens, MD       Or  . acetaminophen (TYLENOL) suppository 650 mg  650 mg Rectal NOW Delight Ovens, MD   650 mg at 01/07/11 1330  . acetaminophen (TYLENOL) tablet 1,000 mg  1,000 mg Oral Q6H Delight Ovens, MD   1,000 mg at 01/08/11 1610   Or  . acetaminophen (TYLENOL) solution 975 mg  975 mg Per Tube Q6H Delight Ovens, MD      . albumin human 5 % solution 250 mL  250 mL Intravenous Q15 min PRN Delight Ovens, MD   250 mL at 01/07/11 1340  . albuterol (PROVENTIL HFA;VENTOLIN HFA) 108 (90 BASE) MCG/ACT inhaler 2 puff  2 puff Inhalation Q6H Delight Ovens, MD   2 puff at 01/08/11 [phone]  . bisacodyl  (DULCOLAX) EC tablet 10 mg  10 mg Oral Daily Delight Ovens, MD   10 mg at 01/08/11 0941   Or  . bisacodyl (DULCOLAX) suppository 10 mg  10 mg Rectal Daily Delight Ovens, MD      . cefUROXime (ZINACEF) 1.5 g in dextrose 5 % 50 mL IVPB  1.5 g Intravenous Once Elwin Sleight, PHARMD      . cefUROXime (ZINACEF) 1.5 g in dextrose 5 % 50 mL IVPB  1.5 g Intravenous Q12H Delight Ovens, MD   1.5 g at 01/08/11 0939  . docusate sodium (COLACE) capsule 200 mg  200 mg Oral Daily Delight Ovens, MD   200 mg at 01/08/11 0941  . famotidine (PEPCID) IVPB 20 mg  20 mg Intravenous Q12H Delight Ovens, MD   20 mg at 01/07/11 1315  . finasteride (PROSCAR) tablet 5 mg  5 mg Oral Daily Delight Ovens, MD   5 mg at 01/08/11 0940  . furosemide (LASIX) injection 20 mg  20 mg Intravenous Q12H Kathlee Nations Trigt III, MD   20 mg at 01/08/11 0900  . heparin 2,500 Units, papaverine 30 mg in electrolyte-148 (PLASMALYTE-148) 500 mL irrigation   Irrigation To OR Elwin Sleight, PHARMD      . insulin aspart (novoLOG) injection 0-24 Units  0-24  Units Subcutaneous Q4H Kathlee Nations Trigt III, MD      . insulin glargine (LANTUS) injection 12 Units  12 Units Subcutaneous Daily Kathlee Nations Trigt III, MD      . insulin regular (HUMULIN R,NOVOLIN R) 1 Units/mL in sodium chloride 0.9 % 100 mL infusion   Intravenous Continuous PRN Kathlee Nations Trigt III, MD 4.7 mL/hr at 01/08/11 1120 4.7 Units/hr at 01/08/11 1120  . lactated ringers infusion 500 mL  500 mL Intravenous Once PRN Delight Ovens, MD      . magnesium sulfate 4 g in dextrose 5 % 100 mL infusion  4 g Intravenous Once Delight Ovens, MD 20 mL/hr at 01/07/11 1800 4 g at 01/07/11 1800  . metoprolol (LOPRESSOR) injection 2.5-5 mg  2.5-5 mg Intravenous Q2H PRN Delight Ovens, MD      . metoprolol tartrate (LOPRESSOR) tablet 25 mg  25 mg Oral BID Kathlee Nations Trigt III, MD   25 mg at 01/08/11 0940   Or  . metoprolol tartrate (LOPRESSOR) 25 mg/10 mL oral suspension 12.5  mg  12.5 mg Per Tube BID Kathlee Nations Trigt III, MD      . morphine 2 MG/ML injection 1-4 mg  1-4 mg Intravenous Q1H PRN Delight Ovens, MD   2 mg at 01/07/11 2216  . morphine 4 MG/ML injection 2-5 mg  2-5 mg Intravenous Q1H PRN Delight Ovens, MD   2 mg at 01/08/11 0319  . nitroGLYCERIN 0.2 mg/mL in dextrose 5 % infusion  0-100 mcg/min Intravenous Continuous Kathlee Nations Trigt III, MD 6 mL/hr at 01/08/11 0800 20 mcg/min at 01/08/11 0800  . ondansetron (ZOFRAN) injection 4 mg  4 mg Intravenous Q6H PRN Delight Ovens, MD      . pantoprazole (PROTONIX) EC tablet 40 mg  40 mg Oral Q1200 Delight Ovens, MD      . potassium chloride 10 mEq in 50 mL *CENTRAL LINE* IVPB  10 mEq Intravenous Q1 Hr x 3 Delight Ovens, MD   10 mEq at 01/07/11 1630  . potassium chloride 10 mEq in 50 mL *CENTRAL LINE* IVPB  10 mEq Intravenous Q1 Hr x 3 Delight Ovens, MD   10 mEq at 01/08/11 0845  . simvastatin (ZOCOR) tablet 20 mg  20 mg Oral QHS Delight Ovens, MD   20 mg at 01/07/11 2155  . sodium chloride 0.9 % injection 3 mL  3 mL Intravenous Q12H Delight Ovens, MD   3 mL at 01/08/11 1000  . sodium chloride 0.9 % injection 3 mL  3 mL Intravenous PRN Delight Ovens, MD      . vancomycin (VANCOCIN) 1,000 mg in sodium chloride 0.9 % 100 mL IVPB  1,000 mg Intravenous Once [employer], PHARMD   1,000 mg at 01/07/11 2335  . DISCONTD: aminocaproic acid (AMICAR) 10 g in sodium chloride 0.9 % 100 mL infusion   Intravenous To OR Elwin Sleight, PHARMD      . DISCONTD: cefUROXime (ZINACEF) 1.5 g in dextrose 5 % 50 mL IVPB  1.5 g Intravenous Q12H Delight Ovens, MD      . DISCONTD: cefUROXime (ZINACEF) 750 mg in dextrose 5 % 50 mL IVPB  750 mg Intravenous To OR Elwin Sleight, PHARMD      . DISCONTD: dexmedetomidine (PRECEDEX) 200 mcg in sodium chloride 0.9 % 50 mL infusion  0.7 mcg/kg/hr Intravenous Titrated Roger A Foley      . DISCONTD: dexmedetomidine (  PRECEDEX) 200 mcg in sodium chloride  0.9 % 50 mL infusion  0.1-0.7 mcg/kg/hr Intravenous Continuous Delight Ovens, MD   0.1 mcg/kg/hr at 01/08/11 0400  . DISCONTD: dexmedetomidine (PRECEDEX) 400 mcg in sodium chloride 0.9 % 100 mL infusion  0.1-0.7 mcg/kg/hr Intravenous To OR Elwin Sleight, PHARMD      . DISCONTD: dexmedetomidine (PRECEDEX) 400 mcg in sodium chloride 0.9 % 100 mL infusion  0.1-0.7 mcg/kg/hr Intravenous To OR Meera Fernand Parkins, PHARMD      . DISCONTD: DOPamine (INTROPIN) 800 mg in dextrose 5 % 250 mL infusion  2-20 mcg/kg/min Intravenous To OR Elwin Sleight, PHARMD      . DISCONTD: EPINEPHrine (ADRENALIN) 4,000 mcg in dextrose 5 % 250 mL infusion  0.5-20 mcg/min Intravenous To OR Elwin Sleight, PHARMD      . DISCONTD: hemostatic agents    PRN Delight Ovens, MD   1 application at 01/07/11 1128  . DISCONTD: insulin regular (HUMULIN R,NOVOLIN R) 1 Units/mL in sodium chloride 0.9 % 100 mL infusion   Intravenous To OR Elwin Sleight, PHARMD      . DISCONTD: insulin regular (HUMULIN R,NOVOLIN R) 1 Units/mL in sodium chloride 0.9 % 100 mL infusion   Intravenous Continuous Delight Ovens, MD 1.5 mL/hr at 01/08/11 0700    . DISCONTD: lactated ringers infusion   Intravenous Continuous Delight Ovens, MD 20 mL/hr at 01/08/11 0800    . DISCONTD: magnesium sulfate 50 % injection 40 mEq  40 mEq Other To OR Elwin Sleight, PHARMD      . DISCONTD: metoprolol tartrate (LOPRESSOR) 25 mg/10 mL oral suspension 12.5 mg  12.5 mg Per Tube BID Delight Ovens, MD      . DISCONTD: metoprolol tartrate (LOPRESSOR) tablet 12.5 mg  12.5 mg Oral BID Delight Ovens, MD      . DISCONTD: midazolam (VERSED) injection 2 mg  2 mg Intravenous Q1H PRN Delight Ovens, MD      . DISCONTD: nitroGLYCERIN 0.2 mg/mL in dextrose 5 % infusion  2-200 mcg/min Intravenous To OR Elwin Sleight, PHARMD      . DISCONTD: nitroGLYCERIN 0.2 mg/mL in dextrose 5 % infusion  0-100 mcg/min Intravenous Continuous Delight Ovens, MD 6 mL/hr  at 01/08/11 0700 20 mcg/min at 01/08/11 0700  . DISCONTD: oxyCODONE (Oxy IR/ROXICODONE) immediate release tablet 5-10 mg  5-10 mg Oral Q3H PRN Delight Ovens, MD      . DISCONTD: phenylephrine (NEO-SYNEPHRINE) 20,000 mcg in dextrose 5 % 250 mL infusion  30-200 mcg/min Intravenous To OR Elwin Sleight, PHARMD      . DISCONTD: phenylephrine (NEO-SYNEPHRINE) 20,000 mcg in dextrose 5 % 250 mL infusion  0-100 mcg/min Intravenous Continuous Loreli Slot, MD   25 mcg/min at 01/07/11 1730  . DISCONTD: potassium chloride injection 80 mEq  80 mEq Other To OR Elwin Sleight, PHARMD      . DISCONTD: Surgifoam 1 Gm with 0.9% sodium chloride (4 ml) topical solution    PRN Delight Ovens, MD      . DISCONTD: vancomycin (VANCOCIN) 1,000 mg in sodium chloride 0.9 % 100 mL IVPB  1,000 mg Intravenous Once Delight Ovens, MD      . DISCONTD: vancomycin (VANCOCIN) 1,250 mg in sodium chloride 0.9 % 250 mL IVPB  1,250 mg Intravenous Once Elwin Sleight, PHARMD       Facility-Administered Medications Ordered in Other Encounters  Medication Dose Route Frequency Provider Last  Rate Last Dose  . cefUROXime (ZINACEF) 0.75 g in dextrose 5 % 50 mL IVPB  0.75 g  Continuous PRN Roger A Foley   0.75 g at 01/07/11 1120  . DISCONTD: 0.9 %  sodium chloride infusion    Continuous PRN Roger A Foley      . DISCONTD: albumin human 5 % solution    Continuous PRN Roger A Foley      . DISCONTD: albuterol (PROVENTIL) (2.5 MG/3ML) 0.083% nebulizer solution    PRN Roger A Foley   0.036 mg at 01/07/11 1114  . DISCONTD: aminocaproic acid (AMICAR) 10 g in sodium chloride 0.9 % 140 mL infusion  10 g  Continuous PRN Roger A Foley 14 mL/hr at 01/07/11 1000 1 g/hr at 01/07/11 1000  . DISCONTD: dexmedetomidine (PRECEDEX) 200 mcg in sodium chloride 0.9 % 50 mL infusion  200 mcg  Continuous PRN Roger A Foley 8.2 mL/hr at 01/07/11 1000 0.4 mcg/kg/hr at 01/07/11 1000  . DISCONTD: fentaNYL (SUBLIMAZE) injection    PRN Roger A Foley   250  mcg at 01/07/11 0930  . DISCONTD: heparin injection    PRN Roger A Foley   25,000 Units at 01/07/11 2952  . DISCONTD: insulin regular (HUMULIN R,NOVOLIN R) 100 Units in sodium chloride 0.9 % 100 mL infusion  100 Units  Continuous PRN Roger A Foley 5.4 mL/hr at 01/07/11 1001 5.4 Units/hr at 01/07/11 1001  . DISCONTD: lactated ringers infusion    Continuous PRN Roger A Foley      . DISCONTD: lidocaine (cardiac) 100 mg/34ml (XYLOCAINE) 20 MG/ML injection 2%    PRN Roger A Foley   60 mg at 01/07/11 0814  . DISCONTD: midazolam (VERSED) 5 MG/5ML injection    PRN Roger A Foley   6 mg at 01/07/11 0930  . DISCONTD: propofol (DIPRIVAN) 10 MG/ML infusion    PRN Roger A Foley   50 mg at 01/07/11 0814  . DISCONTD: protamine injection    PRN Roger A Foley   250 mg at 01/07/11 1129  . DISCONTD: rocuronium (ZEMURON) injection    PRN Roger A Foley   50 mg at 01/07/11 0814  . DISCONTD: vecuronium (NORCURON) injection    PRN Roger A Foley   5 mg at 01/07/11 1100    PE: General appearance: alert, cooperative and no distress Lungs: clear to auscultation bilaterally Heart: regular rate and rhythm, S1, S2 normal, no murmur, click, rub or gallop Extremities: no LEE Pulses: Left Dorsalis Pedis absent Right Dorsalis Pedis present 2+  Lab Results:   Basename 01/08/11 0400 01/07/11 1820 01/07/11 1815 01/07/11 1310  WBC 20.4* -- 19.3* 16.4*  HGB 10.4* 10.5* 10.7* --  HCT 29.5* 31.0* 30.4* --  PLT 65* -- 68* 59*   BMET  Basename 01/08/11 0400 01/07/11 1820 01/07/11 1815 01/07/11 1314  NA 140 145 -- 142  K 3.2* 3.8 -- 3.2*  CL 109 106 -- --  CO2 21 -- -- --  GLUCOSE 105* 106* -- 137*  BUN 16 22 -- --  CREATININE 0.77 1.00 0.93 --  CALCIUM 7.5* -- -- --   PT/INR  Basename 01/07/11 1310  LABPROT 20.2*  INR 1.69*      Assessment/Plan  Patient Active Hospital Problem List:  S/P aortic valve replacement (01/07/2011)  AS (aortic stenosis) () History of Afib  Plan:  Pt looks good. BP/ HR stable and  controlled.  WBC's elevated.  Afebrile.    LOS: 1 day    HAGER,BRYAN W 01/08/2011  11:23 AM  I have seen and examined the patient along with Wilburt Finlay, PA.  I have reviewed the chart, notes and new data.  I agree with Bryan's note.  Key new complaints: none Key examination changes: none - agree with above Key new findings / data: labs stable  PLAN: Tolerating current increased dose of lopressor - monitor for Afib Diuresis per primary service.  Alden Bensinger W 01/08/2011, 2:04 PM

## 2011-01-08 NOTE — Progress Notes (Signed)
1 Day Post-Op Procedure(s) (LRB): AORTIC VALVE REPLACEMENT (AVR) (N/A)                       [address]            [name] [id number]          [phone]      Comfortable this am neuro intact, nsr on IV NTG for bp controll     Objective: Vital signs in last 24 hours: Temp:  [95.7 F (35.4 C)-99.3 F (37.4 C)] 99.1 F (37.3 C) (11/13 0700) Pulse Rate:  [79-89] 79  (11/13 0700) Cardiac Rhythm:  [-] Normal sinus rhythm (11/13 0700) Resp:  [12-26] 16  (11/13 0700) BP: (92-134)/(41-83) 106/57 mmHg (11/13 0700) SpO2:  [94 %-100 %] 96 % (11/13 0742) FiO2 (%):  [39.7 %-50.5 %] 40 % (11/12 1715) Weight:  [197 lb 6.4 oz (89.54 kg)] 197 lb 6.4 oz (89.54 kg) (11/13 0515)  Hemodynamic parameters for last 24 hours: PAP: (14-35)/(5-21) 20/9 mmHg CO:  [4.2 L/min-5.6 L/min] 4.7 L/min CI:  [2.1 L/min/m2-2.8 L/min/m2] 2.3 L/min/m2  Intake/Output from previous day: 11/12 0701 - 11/13 0700 In: 5869 [P.O.:200; I.V.:3376; BJYNW:295; IV Piggyback:1650] Out: 2160 [Urine:1690; Emesis/NG output:100; Chest Tube:370] Intake/Output this shift:   Lungs slight diminished,extrem warm   Lab Results:  Basename 01/08/11 0400 01/07/11 1820 01/07/11 1815  WBC 20.4* -- 19.3*  HGB 10.4* 10.5* --  HCT 29.5* 31.0* --  PLT 65* -- 68*   BMET:  Basename 01/08/11 0400 01/07/11 1820  NA 140 145  K 3.2* 3.8  CL 109 106  CO2 21 --  GLUCOSE 105* 106*  BUN 16 22  CREATININE 0.77 1.00  CALCIUM 7.5* --    PT/INR:  Basename 01/07/11 1310  LABPROT 20.2*  INR 1.69*   ABG    Component Value Date/Time   PHART 7.373 01/07/2011 1810   HCO3 22.7 01/07/2011 1810   TCO2 22 01/07/2011 1820   ACIDBASEDEF 2.0 01/07/2011 1810   O2SAT 96.0 01/07/2011 1810   CBG (last 3)   Basename 01/07/11 2304 01/07/11 2201 01/07/11 2103  GLUCAP 104* 96 94    Assessment/Plan: S/P Procedure(s) (LRB): AORTIC VALVE REPLACEMENT (AVR) (N/A)   Will up BP meds,diurese(wt up 15 lbs), d/c lines and  control cbg   LOS: 1 day    Ronald [name] 01/08/2011

## 2011-01-09 ENCOUNTER — Inpatient Hospital Stay (HOSPITAL_COMMUNITY): Payer: Medicare Other

## 2011-01-09 LAB — GLUCOSE, CAPILLARY
Glucose-Capillary: 108 mg/dL — ABNORMAL HIGH (ref 70–99)
Glucose-Capillary: 153 mg/dL — ABNORMAL HIGH (ref 70–99)
Glucose-Capillary: 183 mg/dL — ABNORMAL HIGH (ref 70–99)
Glucose-Capillary: 248 mg/dL — ABNORMAL HIGH (ref 70–99)
Glucose-Capillary: 75 mg/dL (ref 70–99)
Glucose-Capillary: 81 mg/dL (ref 70–99)
Glucose-Capillary: 76 mg/dL (ref 70–99)
Glucose-Capillary: 92 mg/dL (ref 70–99)
Glucose-Capillary: 132 mg/dL — ABNORMAL HIGH (ref 70–99)
Glucose-Capillary: 130 mg/dL — ABNORMAL HIGH (ref 70–99)
Glucose-Capillary: 288 mg/dL — ABNORMAL HIGH (ref 70–99)
Glucose-Capillary: 253 mg/dL — ABNORMAL HIGH (ref 70–99)
Glucose-Capillary: 228 mg/dL — ABNORMAL HIGH (ref 70–99)

## 2011-01-09 LAB — BASIC METABOLIC PANEL
BUN: 19 mg/dL (ref 6–23)
CO2: 24 mEq/L (ref 19–32)
Calcium: 8.8 mg/dL (ref 8.4–10.5)
Chloride: 103 mEq/L (ref 96–112)
Creatinine, Ser: 0.98 mg/dL (ref 0.50–1.35)
GFR calc Af Amer: 84 mL/min — ABNORMAL LOW (ref >90)
GFR calc non Af Amer: 72 mL/min — ABNORMAL LOW (ref >90)
Glucose, Bld: 90 mg/dL (ref 70–99)
Potassium: 3.8 mEq/L (ref 3.5–5.1)
Sodium: 137 mEq/L (ref 135–145)

## 2011-01-09 LAB — CBC
HCT: 31.8 % — ABNORMAL LOW (ref 39.0–52.0)
Hemoglobin: 11 g/dL — ABNORMAL LOW (ref 13.0–17.0)
MCH: 31.8 pg (ref 26.0–34.0)
MCHC: 34.6 g/dL (ref 30.0–36.0)
MCV: 91.9 fL (ref 78.0–100.0)
Platelets: 67 10*3/uL — ABNORMAL LOW (ref 150–400)
RBC: 3.46 MIL/uL — ABNORMAL LOW (ref 4.22–5.81)
RDW: 14.5 % (ref 11.5–15.5)
WBC: 24 10*3/uL — ABNORMAL HIGH (ref 4.0–10.5)

## 2011-01-09 MED ORDER — PANTOPRAZOLE SODIUM 40 MG PO TBEC
40.0000 mg | DELAYED_RELEASE_TABLET | Freq: Every day | ORAL | Status: DC
  Administered 2011-01-10 – 2011-01-17 (×8): 40 mg via ORAL
  Filled 2011-01-09 (×8): qty 1

## 2011-01-09 MED ORDER — MAGNESIUM HYDROXIDE 400 MG/5ML PO SUSP: 30.0000 mL | Freq: Every day | ORAL | Status: DC | PRN

## 2011-01-09 MED ORDER — GLYBURIDE 2.5 MG PO TABS
2.5000 mg | ORAL_TABLET | Freq: Two times a day (BID) | ORAL | Status: DC
  Administered 2011-01-10 (×2): 2.5 mg via ORAL
  Filled 2011-01-09 (×6): qty 1

## 2011-01-09 MED ORDER — POVIDONE-IODINE 10 % EX SOLN
1.0000 "application " | Freq: Two times a day (BID) | CUTANEOUS | Status: DC
  Administered 2011-01-09 – 2011-01-17 (×16): 1 via TOPICAL
  Filled 2011-01-09: qty 15

## 2011-01-09 MED ORDER — BISACODYL 10 MG RE SUPP: 10.0000 mg | Freq: Every day | RECTAL | Status: DC | PRN

## 2011-01-09 MED ORDER — METOPROLOL TARTRATE 25 MG PO TABS
25.0000 mg | ORAL_TABLET | Freq: Two times a day (BID) | ORAL | Status: DC
  Administered 2011-01-09 – 2011-01-10 (×3): 25 mg via ORAL
  Filled 2011-01-09 (×5): qty 1

## 2011-01-09 MED ORDER — ACETAMINOPHEN 325 MG PO TABS
650.0000 mg | ORAL_TABLET | Freq: Four times a day (QID) | ORAL | Status: DC | PRN
  Administered 2011-01-14: 650 mg via ORAL
  Filled 2011-01-09: qty 2

## 2011-01-09 MED ORDER — MOVING RIGHT ALONG BOOK
Freq: Once | Status: AC
  Administered 2011-01-09: 17:00:00
  Filled 2011-01-09: qty 1

## 2011-01-09 MED ORDER — ONDANSETRON HCL 4 MG/2ML IJ SOLN: 4.0000 mg | Freq: Four times a day (QID) | INTRAMUSCULAR | Status: DC | PRN

## 2011-01-09 MED ORDER — ALUM & MAG HYDROXIDE-SIMETH 400-400-40 MG/5ML PO SUSP
15.0000 mL | ORAL | Status: DC | PRN
  Filled 2011-01-09: qty 30

## 2011-01-09 MED ORDER — BISACODYL 5 MG PO TBEC: 10.0000 mg | DELAYED_RELEASE_TABLET | Freq: Every day | ORAL | Status: DC | PRN

## 2011-01-09 MED ORDER — INSULIN ASPART 100 UNIT/ML ~~LOC~~ SOLN
0.0000 [IU] | Freq: Three times a day (TID) | SUBCUTANEOUS | Status: DC
  Administered 2011-01-09: 8 [IU] via SUBCUTANEOUS
  Administered 2011-01-09: 12 [IU] via SUBCUTANEOUS
  Administered 2011-01-10: 2 [IU] via SUBCUTANEOUS
  Administered 2011-01-10: 8 [IU] via SUBCUTANEOUS
  Administered 2011-01-10: 12 [IU] via SUBCUTANEOUS
  Administered 2011-01-10: 4 [IU] via SUBCUTANEOUS
  Administered 2011-01-11: 2 [IU] via SUBCUTANEOUS
  Administered 2011-01-11: 8 [IU] via SUBCUTANEOUS
  Administered 2011-01-11 (×2): 2 [IU] via SUBCUTANEOUS
  Administered 2011-01-12: 4 [IU] via SUBCUTANEOUS
  Administered 2011-01-12: 8 [IU] via SUBCUTANEOUS
  Administered 2011-01-12: 2 [IU] via SUBCUTANEOUS
  Administered 2011-01-12: 8 [IU] via SUBCUTANEOUS
  Administered 2011-01-13: 4 [IU] via SUBCUTANEOUS
  Administered 2011-01-13 – 2011-01-14 (×4): 2 [IU] via SUBCUTANEOUS
  Administered 2011-01-14 – 2011-01-15 (×3): 4 [IU] via SUBCUTANEOUS
  Administered 2011-01-15: 2 [IU] via SUBCUTANEOUS
  Administered 2011-01-15: 4 [IU] via SUBCUTANEOUS
  Administered 2011-01-15: 8 [IU] via SUBCUTANEOUS
  Administered 2011-01-16 (×3): 4 [IU] via SUBCUTANEOUS
  Administered 2011-01-17: 8 [IU] via SUBCUTANEOUS
  Filled 2011-01-09: qty 3

## 2011-01-09 MED ORDER — SODIUM CHLORIDE 0.9 % IJ SOLN
3.0000 mL | Freq: Two times a day (BID) | INTRAMUSCULAR | Status: DC
  Administered 2011-01-10 – 2011-01-17 (×13): 3 mL via INTRAVENOUS

## 2011-01-09 MED ORDER — GUAIFENESIN ER 600 MG PO TB12
600.0000 mg | ORAL_TABLET | Freq: Two times a day (BID) | ORAL | Status: DC | PRN
  Administered 2011-01-11: 600 mg via ORAL
  Filled 2011-01-09: qty 1

## 2011-01-09 MED ORDER — ONDANSETRON HCL 4 MG PO TABS: 4.0000 mg | ORAL_TABLET | Freq: Four times a day (QID) | ORAL | Status: DC | PRN

## 2011-01-09 MED ORDER — DOCUSATE SODIUM 100 MG PO CAPS
200.0000 mg | ORAL_CAPSULE | Freq: Every day | ORAL | Status: DC
  Administered 2011-01-12 – 2011-01-17 (×6): 200 mg via ORAL
  Filled 2011-01-09 (×8): qty 2

## 2011-01-09 MED ORDER — POTASSIUM CHLORIDE CRYS ER 20 MEQ PO TBCR
20.0000 meq | EXTENDED_RELEASE_TABLET | Freq: Every day | ORAL | Status: DC
  Administered 2011-01-10 – 2011-01-16 (×7): 20 meq via ORAL
  Filled 2011-01-09 (×8): qty 1

## 2011-01-09 MED ORDER — INSULIN GLARGINE 100 UNIT/ML ~~LOC~~ SOLN
25.0000 [IU] | Freq: Two times a day (BID) | SUBCUTANEOUS | Status: DC
  Administered 2011-01-09: 25 [IU] via SUBCUTANEOUS

## 2011-01-09 MED ORDER — FUROSEMIDE 10 MG/ML IJ SOLN
40.0000 mg | Freq: Two times a day (BID) | INTRAMUSCULAR | Status: AC
  Administered 2011-01-09 (×2): 40 mg via INTRAVENOUS
  Filled 2011-01-09 (×2): qty 4

## 2011-01-09 MED ORDER — TRAMADOL HCL 50 MG PO TABS
50.0000 mg | ORAL_TABLET | ORAL | Status: DC | PRN
  Administered 2011-01-10: 100 mg via ORAL
  Filled 2011-01-09: qty 2

## 2011-01-09 NOTE — Progress Notes (Signed)
PT/OT evaluations completed. See notes for details. Recommend 24 hour supervision/assist initially and HHPT/OT at D/C.  Will follow acutely. Thanks!  [facility] [phone] (pager)

## 2011-01-09 NOTE — Progress Notes (Signed)
Physical Therapy Evaluation Patient Details Name: Ronald [name] MRN: [id number] DOB: Nov 24, 1922 Today's Date: 01/09/2011  Problem List:  Patient Active Problem List  Diagnoses  . DIABETES MELLITUS, TYPE II  . HYPERLIPIDEMIA  . DEPRESSION  . RESTLESS LEGS SYNDROME  . HYPERTENSION  . CAD  . PVD  . DIVERTICULOSIS, COLON  . RENAL CYST  . ARTHRITIS  . MURMUR  . CAROTID BRUIT  . SKIN CANCER, HX OF  . RHEUMATIC HEART DISEASE, HX OF  . AS (aortic stenosis)  . DM2 (diabetes mellitus, type 2)  . HTN (hypertension)  . S/P aortic valve replacement  . History of atrial fibrillation    Past Medical History:  Past Medical History  Diagnosis Date  . AS (aortic stenosis)   . DM2 (diabetes mellitus, type 2)   . HTN (hypertension)   . Heart murmur   . Thrombocytopenia due to drugs     seen by [name] plts [id number] no rx  . Cancer     Bladder Cancer local  . Neuropathy due to secondary diabetes   . Dysrhythmia 5-7 weeks ago ... sees [name]  . Peripheral vascular disease very poor circulation legs and feet ... stents right and left legs... done in [name] 's office.   . Depression wife died 4 years ago.     Past Surgical History:  Past Surgical History  Procedure Date  . Renal artery stent     2006 left renal stent  . Peripheral arterial stent graft     2006 left anf right illiac stents Dr Orson Slick    PT Assessment/Plan/Recommendation PT Assessment Clinical Impression Statement: Patient is s/pAVR with limitations secondary to pain and new precautions to learn.  Pt. will benefit from PT to reach Modif I level to return home. PT Recommendation/Assessment: Patient will need skilled PT in the acute care venue PT Problem List: Decreased activity tolerance;Decreased balance;Decreased mobility;Decreased knowledge of use of DME;Decreased safety awareness;Decreased knowledge of precautions;Pain PT Therapy Diagnosis : Difficulty walking;Acute pain PT Plan PT Frequency: Min  3X/week PT Treatment/Interventions: DME instruction;Gait training;Stair training;Functional mobility training;Therapeutic exercise;Balance training;Patient/family education PT Recommendation Recommendations for Other Services: OT consult Follow Up Recommendations: Home health PT;24 hour supervision/assistance (May need 24 hour supervision initially) Equipment Recommended: None recommended by PT PT Goals  Acute Rehab PT Goals Time For Goal Achievement: 7 days Pt will Roll Supine to Left Side: with modified independence PT Goal: Rolling Supine to Left Side - Progress: Other (comment) Pt will Transfer Sit to Stand/Stand to Sit: with modified independence;without upper extremity assist PT Transfer Goal: Sit to Stand/Stand to Sit - Progress: Other (comment) Pt will Ambulate: >150 feet;with modified independence;with least restrictive assistive device PT Goal: Ambulate - Progress: Other (comment) Pt will Go Up / Down Stairs: 1-2 stairs;with modified independence;with least restrictive assistive device PT Goal: Up/Down Stairs - Progress: Other (comment)  PT Evaluation Precautions/Restrictions  Precautions Precautions: Sternal;Fall Restrictions Weight Bearing Restrictions: Yes RUE Weight Bearing: Non weight bearing LUE Weight Bearing: Non weight bearing Other Position/Activity Restrictions: due to sternal precautions Prior Functioning  Home Living Lives With: Alone Receives Help From: Family Type of Home: House Home Layout: One level Home Access: Level entry Bathroom Shower/Tub: Engineer, manufacturing systems: Standard Bathroom Accessibility: Yes How Accessible: Accessible via walker Home Adaptive Equipment: Bedside commode/3-in-1;Electric Scooter;Shower chair without back;Walker - rolling;Wheelchair - Doctor, general practice Prior Function Level of Independence: Independent with homemaking with ambulation Able to Take Stairs?: Yes Driving: Yes Vocation: Full time  employment  Cognition Cognition Arousal/Alertness: Awake/alert Overall Cognitive Status: Appears within functional limits for tasks assessed Orientation Level: Oriented X4 Sensation/Coordination Sensation Light Touch: Appears Intact Stereognosis: Not tested Hot/Cold: Not tested Proprioception: Not tested Coordination Gross Motor Movements are Fluid and Coordinated: Yes Fine Motor Movements are Fluid and Coordinated: Yes Extremity Assessment RUE Assessment RUE Assessment: Within Functional Limits LUE Assessment LUE Assessment: Within Functional Limits RLE Assessment RLE Assessment: Within Functional Limits LLE Assessment LLE Assessment: Within Functional Limits Mobility (including Balance) Bed Mobility Bed Mobility: Yes Rolling Left: 3: Mod assist Rolling Left Details (indicate cue type and reason): Pt. needed cues for sternal precautions. Left Sidelying to Sit: 3: Mod assist;HOB elevated (comment degrees) Left Sidelying to Sit Details (indicate cue type and reason): Pt. needed cues for sternal precautions not to overuse UEs and to hold pillow Sitting - Scoot to Edge of Bed: 3: Mod assist;With rail Sitting - Scoot to [employer] of Bed Details (indicate cue type and reason): Pt. needed cues to reciprocal scoot and not to use UEs Transfers Transfers: Yes Sit to Stand: 3: Mod assist;From elevated surface;With upper extremity assist;From bed (UEs on knees to follow sternal precautions) Sit to Stand Details (indicate cue type and reason): cues for hand placement Stand to Sit: 4: Min assist;With upper extremity assist;To chair/3-in-1 (Cues for hand placement to put on knees to control descent) Ambulation/Gait Ambulation/Gait: Yes Ambulation/Gait Assistance: 4: Min assist Ambulation/Gait Assistance Details (indicate cue type and reason): Pt. needed cues and assist for steering w/c and to control speed of w/c.  Pt. also needed cues to stay close to w/c.   Ambulation Distance (Feet): 125  Feet Assistive device:  (pushing w/c) Gait Pattern: Step-to pattern;Decreased stride length;Trunk flexed (Trunk slightly flexed due to sternal incision) Gait velocity: slow cadence Stairs: No Wheelchair Mobility Wheelchair Mobility: No  Posture/Postural Control Posture/Postural Control: No significant limitations Balance Balance Assessed: No Exercise    End of Session PT - End of Session Equipment Utilized During Treatment: Gait belt Activity Tolerance: Patient tolerated treatment well Patient left: in chair;with call bell in reach;with family/visitor present Nurse Communication: Mobility status for ambulation General Behavior During Session: [facility] for tasks performed Cognition: [facility] for tasks performed  INGOLD,Kima Malenfant 01/09/2011, 3:08 PM [facility] [phone] (pager)

## 2011-01-09 NOTE — Progress Notes (Signed)
Subjective: Looknig great on POD #2 Sitting in chair - great spirits. Recent Outpt A1C 8.1  Past History  Past Medical History (reviewed - no changes required): DM2 with Peripheral Neuropathy, Microalbuminuria/Vasular Complications, CAD, Severe PAD/Chronic leg Pain/Claudication with PCI/stenting to B SFA 2006 Current ABIs @ .5, RAS S/P PCI, Hyperlipidemia, Severe AS in Pt with H/O Rheumatic Heart Disease (Rheumatic Fever 1946 S/P 3 mon in hospital), H/O Renal Cyst, Divertisculosis. CRI/CKD Cr 1.2-1.5 Cr Cl 62. COPD/Tobacco abuse, thrombocytopenia. OA/Shoulder Pain. BPH, H/O Bladder Carcinoma - high grade urothelial carcinoma (high grade papillary urothelial carcinoma vs carcinoma in situ) S/P Operation and BCG, ED, Depression and Grief after his wife died  T12 comp F(x) due to trauma 1995  Peripheral Edema/Venous stasis doing well with stockings  Skin Cancer removed L Hand 03/2010 Dr Terri Piedra.  Surgical History (reviewed - no changes required): 03/24/02 PVD/PAD  R common, Right Ex illiac, PCA & Stents  L common, L Ex illiac  RAS  T &A  Nasal Polypectomy  B Cataract  Bladder Cancer S/P 06/13/08 Cystoscopy with transurethral resection of bladder lesion and random bladder biopsies. Local Excision BCG Rx 5/26   Objective: Vital signs in last 24 hours: Temp:  [98.4 F (36.9 C)-100.4 F (38 C)] 98.6 F (37 C) (11/14 0400) Pulse Rate:  [70-92] 80  (11/14 0800) Resp:  [18-31] 20  (11/14 0800) BP: (84-140)/(42-72) 123/60 mmHg (11/14 0800) SpO2:  [90 %-97 %] 96 % (11/14 0907) Weight:  [90.3 kg (199 lb 1.2 oz)] 199 lb 1.2 oz (90.3 kg) (11/14 0500) Weight change: 0.76 kg (1 lb 10.8 oz)    Intake/Output from previous day: 11/13 0701 - 11/14 0700 In: 2593.8 [P.O.:1710; I.V.:681.8; IV Piggyback:202] Out: 1690 [Urine:1630; Chest Tube:60] Intake/Output this shift: Total I/O In: 24 [I.V.:20; IV Piggyback:4] Out: -   PE) General appearance: alert and oriented and looks better than  expected Neurologic: intact  Heart: regular rate and rhythm, S1, S2 normal, no murmur Lungs: diminished breath sounds bibasilar  Abdomen: soft, non-tender; bowel sounds normal; no masses, no organomegaly  Extremities: extremities normal but some edema. Wound: Patient's wound is per CVTS.  Lab Results:  Basename 01/09/11 0425 01/08/11 1714 01/08/11 1700  WBC 24.0* -- 23.2*  HGB 11.0* 10.9* --  HCT 31.8* 32.0* --  PLT 67* -- 67*   BMET  Basename 01/09/11 0425 01/08/11 1714 01/08/11 0400  NA 137 138 --  K 3.8 3.6 --  CL 103 102 --  CO2 24 -- 21  GLUCOSE 90 243* --  BUN 19 19 --  CREATININE 0.98 0.90 --  CALCIUM 8.8 -- 7.5*    Studies/Results: Dg Chest Port 1 View  01/09/2011  *RADIOLOGY REPORT*  Clinical Data: Post cardiac surgery, chest pain  PORTABLE CHEST - 1 VIEW  Comparison: 01/08/2011  Findings: Cardiomegaly with mild interstitial edema and bilateral pleural effusions.  Bilateral lower lobe opacities, likely atelectasis.  Cardiomegaly. Postsurgical changes.  Stable right IJ venous sheath.  Interval removal of Swan-Ganz catheter.  Lower mediastinal drains are not well visualized and may have been removed.  IMPRESSION: Cardiomegaly with mild interstitial edema and bilateral pleural effusion. Bilateral lower lobe opacities, likely atelectasis.  Original Report Authenticated By: Charline Bills, M.D.   Dg Chest Portable 1 View  01/08/2011  *RADIOLOGY REPORT*  Clinical Data: Postop open heart surgery  PORTABLE CHEST - 1 VIEW  Comparison: 01/07/2011  Findings: Swan-Ganz catheter tip is in the right ventricular outflow tract.  There has been interval removal of the ET  tube.  Moderate cardiac enlargement, small pleural effusions and mild interstitial edema is similar to previous exam.  No pneumothorax identified.  IMPRESSION:  1.  No change in mild CHF pattern status post CABG procedure.  Original Report Authenticated By: Rosealee Albee, M.D.   Dg Chest Portable 1  View  01/07/2011  *RADIOLOGY REPORT*  Clinical Data: Status post CABG.  PORTABLE CHEST - 1 VIEW  Comparison: 01/03/2011.  Findings: Endotracheal tube is in satisfactory position. Nasogastric tube is followed into the stomach.  Right IJ Swan-Ganz catheter tip projects over the proximal right pulmonary artery. Mediastinal drain, left chest tube and epicardial pacer wires are in place.  Heart size stable.  Lungs are low in volume with probable vascular crowding.  No pleural fluid.  No pneumothorax.  IMPRESSION:  1.  Postoperative changes of median sternotomy without complicating feature. 2.  Low lung volumes with probable vascular crowding.  Original Report Authenticated By: Reyes Ivan, M.D.     Assessment/Plan: Principal Problem:  *S/P aortic valve replacement Active Problems:  AS (aortic stenosis)  Problem # 1: AORTIC STENOSIS (ICD-424.1)  POD #2 AORTIC VALVE REPLACEMENT (AVR) with pericardial tissue valve and placement of the atrial clip   Severe AS in Pt with H/O Rheumatic Heart Disease (Rheumatic Fever 1946 S/P 3 mon in hospital),   Postop thrombocytopenia, patient has history of preoperative thrombocytopenia mild.  At 89 - lower than usual and stable - continue to follow. Mobilize, Diuresis as weight up 10 #s and some LE Edema.  patient so far is remaining in sinus rhythm.   Preoperatively the patient was on Plavix which will continue but because of his continued thrombocytopenia will wait on that. He is known to be allergic to aspirin including anaphylaxis. He is off Plavix and Celebrex at moment and I aagree.   Problem # 2: ACCIDENTAL FALL (ICD-E888.9)  recent fall and injury in [place] 5 weeks ago.  Some shoulder issues can be worked on with PT.   Problem # 3: RENAL DISEASE, CHRONIC, MILD (ICD-585.2)  CRI/CKD Cr 1.02-1.5 Cr Cl 62.  Kidney function will need to be followed very carefully in the hospital. Current Cr excellent.   Problem # 4: DIABETES MELLITUS, TYPE II,  CONTROLLED, W/VASCULAR COMPS (ICD-250.70)   he is off Glucovance and may need to restart as able. ISS and CBGs 90-132 last few.  #5.  Elevated WBC - re-eval tomorrow.  He looks very good.  OutPt Complete Medication List:  1) Benazepril-hydrochlorothiazide 20-12.5 Mg Tabs (Benazepril-hydrochlorothiazide) .... 2 po qd  2) Flonase 50 Mcg/act Susp (Fluticasone propionate) .Marland Kitchen.. 1 spary in each nostril bid  3) Glucovance 5-500 Mg Tabs (Glyburide-metformin) .Marland Kitchen.. 1 tab by mouth in am and 1/2 in evening  4) Klor-con M20 20 Meq Cr-tabs (Potassium chloride crys cr) .Marland Kitchen.. 1 tab daily  5) Plavix 75 Mg Tabs (Clopidogrel bisulfate) .Marland Kitchen.. 1 tab daily  6) Proscar 5 Mg Tabs (Finasteride) .Marland Kitchen.. 1 tab daily  7) Toprol Xl 100 Mg Xr24h-tab (Metoprolol succinate) .Marland Kitchen.. 1 tab daily  8) Zocor 20 Mg Tabs (Simvastatin) .Marland Kitchen.. 1 tab daily  9) Norvasc 10 Mg Tabs (Amlodipine besylate) .Marland Kitchen.. 1 tablet by mouth daily  10) Doxazosin Mesylate 4 Mg Tabs (Doxazosin mesylate) .Marland Kitchen.. 1 tablet by mouth daily  11) Lasix 20 Mg Tabs (Furosemide) .Marland Kitchen.. 1 tablet by mouith daily  12) Celebrex 200 Mg Caps (Celecoxib) .Marland Kitchen.. 1 tablet by mouth as needed.  13) Onetouch Ultra Test Strp (Glucose blood) .... Self monitor blood glucose twice  daily and as needed (dx 250.02)  14) Freestyle Test Strp (Glucose blood) .... Use 1 strip tid       LOS: 2 days   Finnick Orosz M 01/09/2011, 9:27 AM

## 2011-01-09 NOTE — Progress Notes (Addendum)
Inpatient Diabetes Program Recommendations  AACE/ADA: New Consensus Statement on Inpatient Glycemic Control (2009)  Target Ranges:  Prepandial:   less than 140 mg/dL      Peak postprandial:   less than 180 mg/dL (1-2 hours)      Critically ill patients:  140 - 180 mg/dL   Reason for Visit: CBG at lunch up to 288 mg/dL  Inpatient Diabetes Program Recommendations Insulin - Meal Coverage: Add Novolog meal coverage 6 units tid with meals to cover CHO intake (hold if pt. eats less than 50% or NPO) HgbA1C: Note A1c=8.0%  Note:

## 2011-01-09 NOTE — Addendum Note (Signed)
Addendum  created 01/09/11 0806 by Melonie Florida, MD   Modules edited:PRL Based Order Sets

## 2011-01-09 NOTE — Progress Notes (Signed)
2 Days Post-Op Procedure(s) (LRB): AORTIC VALVE REPLACEMENT (AVR) (N/A) Subjective: Patient is a wake alert in the chair he is remaining in sinus rhythm and neurologically intact. He has not started walking yet  Objective: Vital signs in last 24 hours: Temp:  [98.4 F (36.9 C)-100.4 F (38 C)] 98.6 F (37 C) (11/14 0000) Pulse Rate:  [70-90] 75  (11/14 0300) Cardiac Rhythm:  [-] Heart block (11/14 0200) Resp:  [15-26] 21  (11/14 0300) BP: (84-135)/(42-72) 98/51 mmHg (11/14 0300) SpO2:  [91 %-97 %] 91 % (11/14 0300)  Hemodynamic parameters for last 24 hours: PAP: (19-58)/(8-41) 34/21 mmHg CO:  [2.2 L/min-4.3 L/min] 2.2 L/min CI:  [2.2 L/min/m2-4.3 L/min/m2] 4.3 L/min/m2  Intake/Output from previous day: 11/13 0701 - 11/14 0700 In: 2513.8 [P.O.:1710; I.V.:601.8; IV Piggyback:202] Out: 1545 [Urine:1485; Chest Tube:60] Intake/Output this shift: Total I/O In: 290 [P.O.:150; I.V.:140] Out: 870 [Urine:870]  General appearance: alert and cooperative Neurologic: intact Heart: regular rate and rhythm, S1, S2 normal, no murmur, click, rub or gallop, normal apical impulse, prominent apical impulse and no rub Lungs: diminished breath sounds bibasilar Abdomen: soft, non-tender; bowel sounds normal; no masses,  no organomegaly Extremities: extremities normal, atraumatic, no cyanosis or edema, Homans sign is negative, no sign of DVT and no edema, redness or tenderness in the calves or thighs Wound: Patient's wound is intact in addition the rash at his right shoulder is much improved  Lab Results:  Basename 01/09/11 0425 01/08/11 1714 01/08/11 1700  WBC 24.0* -- 23.2*  HGB 11.0* 10.9* --  HCT 31.8* 32.0* --  PLT 67* -- 67*   BMET:  Basename 01/09/11 0425 01/08/11 1714 01/08/11 0400  NA 137 138 --  K 3.8 3.6 --  CL 103 102 --  CO2 24 -- 21  GLUCOSE 90 243* --  BUN 19 19 --  CREATININE 0.98 0.90 --  CALCIUM 8.8 -- 7.5*    PT/INR:  Basename 01/07/11 1310  LABPROT 20.2*  INR  1.69*   ABG    Component Value Date/Time   PHART 7.373 01/07/2011 1810   HCO3 22.7 01/07/2011 1810   TCO2 21 01/08/2011 1714   ACIDBASEDEF 2.0 01/07/2011 1810   O2SAT 96.0 01/07/2011 1810   CBG (last 3)   Basename 01/09/11 0424 01/09/11 0327 01/09/11 0235  GLUCAP 92 76 75    Assessment/Plan: S/P Procedure(s) (LRB): AORTIC VALVE REPLACEMENT (AVR)  with pericardial tissue valve and placement of the atrial clip Postop thrombocytopenia, patient has history of preoperative thrombocytopenia mild Mobilize Diuresis Diabetes control See progression orders patient so far is remaining in sinus rhythm. Preoperatively the patient was on Plavix which will continue but because of his continued thrombocytopenia will wait on that. He is known to be allergic to aspirin including anaphylaxis.   LOS: 2 days    [name] 01/09/2011 6:57 AM

## 2011-01-09 NOTE — Addendum Note (Signed)
Addendum  created 01/09/11 0810 by Melonie Florida, MD   Modules edited:PRL Based Order Sets

## 2011-01-09 NOTE — Progress Notes (Signed)
Subjective: Mild chest wall pain.  Objective: Vital signs in last 24 hours: Temp:  [98.4 F (36.9 C)-100.4 F (38 C)] 98.6 F (37 C) (11/14 0400) Pulse Rate:  [70-92] 80  (11/14 0800) Resp:  [18-31] 20  (11/14 0800) BP: (84-140)/(42-72) 123/60 mmHg (11/14 0800) SpO2:  [90 %-97 %] 96 % (11/14 0907) Weight:  [90.3 kg (199 lb 1.2 oz)] 199 lb 1.2 oz (90.3 kg) (11/14 0500) Weight change: 0.76 kg (1 lb 10.8 oz)   Intake/Output from previous day: 11/13 0701 - 11/14 0700 In: 2593.8 [P.O.:1710; I.V.:681.8; IV Piggyback:202] Out: 1690 [Urine:1630; Chest Tube:60] Intake/Output this shift: Total I/O In: 44 [I.V.:40; IV Piggyback:4] Out: 120 [Urine:120]  PE: GENERAL: A&O X 3 HEART:S1S2, RRR. LUNGS: clear ABD: pos. BS, soft nontender EXT: no edema.   Lab Results:  Basename 01/09/11 0425 01/08/11 1714 01/08/11 1700  WBC 24.0* -- 23.2*  HGB 11.0* 10.9* --  HCT 31.8* 32.0* --  PLT 67* -- 67*   BMET  Basename 01/09/11 0425 01/08/11 1714 01/08/11 0400  NA 137 138 --  K 3.8 3.6 --  CL 103 102 --  CO2 24 -- 21  GLUCOSE 90 243* --  BUN 19 19 --  CREATININE 0.98 0.90 --  CALCIUM 8.8 -- 7.5*   No results found for this basename: TROPONINI:2,CK,MB:2 in the last 72 hours  No results found for this basename: CHOL, HDL, LDLCALC, LDLDIRECT, TRIG, CHOLHDL   Lab Results  Component Value Date   HGBA1C 8.0* 01/03/2011     No results found for this basename: TSH    Hepatic Function Panel No results found for this basename: PROT,ALBUMIN,AST,ALT,ALKPHOS,BILITOT,BILIDIR,IBILI in the last 72 hours No results found for this basename: CHOL in the last 72 hours No results found for this basename: PROTIME in the last 72 hours    EKG: Orders placed during the hospital encounter of 01/07/11  . EKG 12-LEAD  . EKG 12-LEAD  . EKG 12-LEAD  . EKG 12-LEAD  . EKG 12-LEAD  . EKG 12-LEAD  . EKG 12-LEAD  . EKG 12-LEAD    Studies/Results: Dg Chest Port 1 View  01/09/2011  *RADIOLOGY REPORT*   Clinical Data: Post cardiac surgery, chest pain  PORTABLE CHEST - 1 VIEW  Comparison: 01/08/2011  Findings: Cardiomegaly with mild interstitial edema and bilateral pleural effusions.  Bilateral lower lobe opacities, likely atelectasis.  Cardiomegaly. Postsurgical changes.  Stable right IJ venous sheath.  Interval removal of Swan-Ganz catheter.  Lower mediastinal drains are not well visualized and may have been removed.  IMPRESSION: Cardiomegaly with mild interstitial edema and bilateral pleural effusion. Bilateral lower lobe opacities, likely atelectasis.  Original Report Authenticated By: Charline Bills, M.D.   Dg Chest Portable 1 View  01/08/2011  *RADIOLOGY REPORT*  Clinical Data: Postop open heart surgery  PORTABLE CHEST - 1 VIEW  Comparison: 01/07/2011  Findings: Swan-Ganz catheter tip is in the right ventricular outflow tract.  There has been interval removal of the ET tube.  Moderate cardiac enlargement, small pleural effusions and mild interstitial edema is similar to previous exam.  No pneumothorax identified.  IMPRESSION:  1.  No change in mild CHF pattern status post CABG procedure.  Original Report Authenticated By: Rosealee Albee, M.D.   Dg Chest Portable 1 View  01/07/2011  *RADIOLOGY REPORT*  Clinical Data: Status post CABG.  PORTABLE CHEST - 1 VIEW  Comparison: 01/03/2011.  Findings: Endotracheal tube is in satisfactory position. Nasogastric tube is followed into the stomach.  Right IJ Swan-Ganz catheter  tip projects over the proximal right pulmonary artery. Mediastinal drain, left chest tube and epicardial pacer wires are in place.  Heart size stable.  Lungs are low in volume with probable vascular crowding.  No pleural fluid.  No pneumothorax.  IMPRESSION:  1.  Postoperative changes of median sternotomy without complicating feature. 2.  Low lung volumes with probable vascular crowding.  Original Report Authenticated By: Reyes Ivan, M.D.    Medications: I have reviewed the  patient's current medications.  Assessment/Plan: Patient Active Problem List  Diagnoses  . DIABETES MELLITUS, TYPE II  . HYPERLIPIDEMIA  . DEPRESSION  . RESTLESS LEGS SYNDROME  . HYPERTENSION  . CAD  . PVD  . DIVERTICULOSIS, COLON  . RENAL CYST  . ARTHRITIS  . MURMUR  . CAROTID BRUIT  . SKIN CANCER, HX OF  . RHEUMATIC HEART DISEASE, HX OF  . AS (aortic stenosis)  . DM2 (diabetes mellitus, type 2)  . HTN (hypertension)  . S/P aortic valve replacement  . History of atrial fibrillation    PLAN: See Dr. Hazle Coca noted  LOS: 2 days   INGOLD,LAURA R 01/09/2011, 10:16 AM Agree with note written by [name] RNP Pt is POD #2 tissue AVR and and atrial clip. NSR. Exam benign. Increased WBCs. Afeb. On no drips. Advance per TCTS.   [name] 01/09/2011 10:21 AM

## 2011-01-10 ENCOUNTER — Inpatient Hospital Stay (HOSPITAL_COMMUNITY): Payer: Medicare Other

## 2011-01-10 ENCOUNTER — Encounter (HOSPITAL_COMMUNITY): Payer: Self-pay | Admitting: Cardiothoracic Surgery

## 2011-01-10 LAB — GLUCOSE, CAPILLARY
Glucose-Capillary: 124 mg/dL — ABNORMAL HIGH (ref 70–99)
Glucose-Capillary: 261 mg/dL — ABNORMAL HIGH (ref 70–99)
Glucose-Capillary: 176 mg/dL — ABNORMAL HIGH (ref 70–99)
Glucose-Capillary: 212 mg/dL — ABNORMAL HIGH (ref 70–99)

## 2011-01-10 LAB — BASIC METABOLIC PANEL
BUN: 23 mg/dL (ref 6–23)
CO2: 29 mEq/L (ref 19–32)
Calcium: 8.6 mg/dL (ref 8.4–10.5)
Chloride: 101 mEq/L (ref 96–112)
Creatinine, Ser: 1.1 mg/dL (ref 0.50–1.35)
GFR calc Af Amer: 68 mL/min — ABNORMAL LOW (ref >90)
GFR calc non Af Amer: 59 mL/min — ABNORMAL LOW (ref >90)
Glucose, Bld: 118 mg/dL — ABNORMAL HIGH (ref 70–99)
Potassium: 3.8 mEq/L (ref 3.5–5.1)
Sodium: 140 mEq/L (ref 135–145)

## 2011-01-10 LAB — CBC
HCT: 32.3 % — ABNORMAL LOW (ref 39.0–52.0)
Hemoglobin: 11.2 g/dL — ABNORMAL LOW (ref 13.0–17.0)
MCH: 32.1 pg (ref 26.0–34.0)
MCHC: 34.7 g/dL (ref 30.0–36.0)
MCV: 92.6 fL (ref 78.0–100.0)
Platelets: 77 10*3/uL — ABNORMAL LOW (ref 150–400)
RBC: 3.49 MIL/uL — ABNORMAL LOW (ref 4.22–5.81)
RDW: 14.7 % (ref 11.5–15.5)
WBC: 18.4 10*3/uL — ABNORMAL HIGH (ref 4.0–10.5)

## 2011-01-10 MED ORDER — METOPROLOL TARTRATE 25 MG PO TABS: 25.0000 mg | ORAL_TABLET | Freq: Two times a day (BID) | ORAL | Status: DC

## 2011-01-10 MED ORDER — FUROSEMIDE 40 MG PO TABS: 40.0000 mg | ORAL_TABLET | Freq: Every day | ORAL | Status: DC

## 2011-01-10 MED ORDER — TRAMADOL HCL 50 MG PO TABS: 50.0000 mg | ORAL_TABLET | ORAL | Status: DC | PRN

## 2011-01-10 MED ORDER — GLYBURIDE-METFORMIN 5-500 MG PO TABS: 0.5000 | ORAL_TABLET | Freq: Two times a day (BID) | ORAL | Status: DC

## 2011-01-10 MED ORDER — LORATADINE 10 MG PO TABS: 10.0000 mg | ORAL_TABLET | Freq: Every day | ORAL | Status: DC

## 2011-01-10 MED ORDER — ACETAMINOPHEN 500 MG PO TABS: 1000.0000 mg | ORAL_TABLET | Freq: Four times a day (QID) | ORAL | Status: DC | PRN

## 2011-01-10 MED ORDER — POTASSIUM CHLORIDE CRYS ER 20 MEQ PO TBCR: 20.0000 meq | EXTENDED_RELEASE_TABLET | Freq: Every day | ORAL | Status: AC

## 2011-01-10 MED ORDER — DOXAZOSIN MESYLATE 4 MG PO TABS: 4.0000 mg | ORAL_TABLET | Freq: Every day | ORAL | Status: DC

## 2011-01-10 MED ORDER — INSULIN GLARGINE 100 UNIT/ML ~~LOC~~ SOLN
20.0000 [IU] | Freq: Every day | SUBCUTANEOUS | Status: DC
  Administered 2011-01-10: 20 [IU] via SUBCUTANEOUS
  Filled 2011-01-10 (×4): qty 3

## 2011-01-10 NOTE — Progress Notes (Addendum)
CARDIAC REHAB PHASE I   PRE:  Rate/Rhythm: 87 SR    BP: sitting 120/56    SaO2: 94 1L  MODE:  Ambulation: 350 ft   POST:  Rate/Rhythm: 101     BP: to BR     SaO2: 96 1L  Pt tolerated fairly well with assist x2, RW and 1L O2. No specific c/o. Needed assist to stand from low recliner. Left pt in BR after walk.  Did not try to wean O2. [id number]  Harriet Masson CES, ACSM

## 2011-01-10 NOTE — Progress Notes (Signed)
[facility]  Subjective: Did not sleep well.  Objective: Vital signs in last 24 hours: Temp:  [97.8 F (36.6 C)-98.7 F (37.1 C)] 97.8 F (36.6 C) (11/15 0501) Pulse Rate:  [79-100] 92  (11/15 0501) Resp:  [18-25] 18  (11/15 0501) BP: (107-156)/(48-77) 125/69 mmHg (11/15 0501) SpO2:  [91 %-96 %] 92 % (11/15 0758) Weight:  [81.647 kg (180 lb)-89.54 kg (197 lb 6.4 oz)] 197 lb 6.4 oz (89.54 kg) (11/15 0501) Last BM Date: 01/09/11  Intake/Output from previous day: 11/14 0701 - 11/15 0700 In: 904 [P.O.:720; I.V.:180; IV Piggyback:4] Out: 2970 [Urine:2970] Intake/Output this shift:    Medications Current Facility-Administered Medications  Medication Dose Route Frequency Provider Last Rate Last Dose  . 0.45 % sodium chloride infusion   Intravenous Continuous Delight Ovens, MD 20 mL/hr at 01/09/11 1600    . 0.9 %  sodium chloride infusion   Intravenous Continuous Delight Ovens, MD 20 mL/hr at 01/08/11 0400    . 0.9 %  sodium chloride infusion  250 mL Intravenous Continuous Delight Ovens, MD      . acetaminophen (TYLENOL) tablet 1,000 mg  1,000 mg Oral Q6H Delight Ovens, MD   1,000 mg at 01/10/11 1610   Or  . acetaminophen (TYLENOL) solution 975 mg  975 mg Per Tube Q6H Delight Ovens, MD      . acetaminophen (TYLENOL) tablet 650 mg  650 mg Oral Q6H PRN Kathlee Nations Trigt III, MD      . albumin human 5 % solution 250 mL  250 mL Intravenous Q15 min PRN Delight Ovens, MD   250 mL at 01/07/11 1340  . albuterol (PROVENTIL HFA;VENTOLIN HFA) 108 (90 BASE) MCG/ACT inhaler 2 puff  2 puff Inhalation Q6H Delight Ovens, MD   2 puff at 01/10/11 [phone]  . alum & mag hydroxide-simeth (MAALOX PLUS) 400-400-40 MG/5ML suspension 15-30 mL  15-30 mL Oral Q4H PRN Kathlee Nations Trigt III, MD      . bisacodyl (DULCOLAX) EC tablet 10 mg  10 mg Oral Daily Delight Ovens, MD   10 mg at 01/09/11 1045   Or  . bisacodyl (DULCOLAX) suppository 10 mg  10 mg  Rectal Daily Delight Ovens, MD      . bisacodyl (DULCOLAX) EC tablet 10 mg  10 mg Oral Daily PRN Kathlee Nations Trigt III, MD       Or  . bisacodyl (DULCOLAX) suppository 10 mg  10 mg Rectal Daily PRN Kathlee Nations Trigt III, MD      . cefUROXime (ZINACEF) 1.5 g in dextrose 5 % 50 mL IVPB  1.5 g Intravenous Once Elwin Sleight, PHARMD      . cefUROXime (ZINACEF) 1.5 g in dextrose 5 % 50 mL IVPB  1.5 g Intravenous Q12H Delight Ovens, MD   1.5 g at 01/09/11 1047  . docusate sodium (COLACE) capsule 200 mg  200 mg Oral Daily Delight Ovens, MD   200 mg at 01/09/11 1046  . docusate sodium (COLACE) capsule 200 mg  200 mg Oral Daily Kathlee Nations Trigt III, MD      . finasteride (PROSCAR) tablet 5 mg  5 mg Oral Daily Delight Ovens, MD   5 mg at 01/09/11 1046  . furosemide (LASIX) injection 40 mg  40 mg Intravenous Q12H Delight Ovens, MD   40 mg at 01/09/11 2008  . glyBURIDE (DIABETA) tablet 2.5 mg  2.5 mg Oral  BID WC [name], MD      . guaiFENesin [facility]) 12 hr tablet 600 mg  600 mg Oral Q12H PRN Kathlee Nations Trigt III, MD      . insulin aspart (novoLOG) injection 0-24 Units  0-24 Units Subcutaneous TID AC & HS Kerin Perna III, MD   8 Units at 01/09/11 2121  . insulin glargine (LANTUS) injection 25 Units  25 Units Subcutaneous BID [name], MD   25 Units at 01/09/11 2124  . magnesium hydroxide (MILK OF MAGNESIA) suspension 30 mL  30 mL Oral Daily PRN Kathlee Nations Trigt III, MD      . metoprolol (LOPRESSOR) injection 2.5-5 mg  2.5-5 mg Intravenous Q2H PRN Delight Ovens, MD      . metoprolol tartrate (LOPRESSOR) tablet 25 mg  25 mg Oral BID Kathlee Nations Trigt III, MD   25 mg at 01/09/11 1046   Or  . metoprolol tartrate (LOPRESSOR) 25 mg/10 mL oral suspension 12.5 mg  12.5 mg Per Tube BID Kathlee Nations Trigt III, MD   12.5 mg at 01/09/11 2233  . metoprolol tartrate (LOPRESSOR) tablet 25 mg  25 mg Oral BID Kathlee Nations Trigt III, MD   25 mg at 01/09/11 2233  . morphine 4 MG/ML injection 2-5 mg   2-5 mg Intravenous Q1H PRN Delight Ovens, MD   2 mg at 01/08/11 0319  . moving right along book   Does not apply Once Kathlee Nations Trigt III, MD      . nitroGLYCERIN 0.2 mg/mL in dextrose 5 % infusion  0-100 mcg/min Intravenous Continuous Kathlee Nations Trigt III, MD 6 mL/hr at 01/08/11 0800 20 mcg/min at 01/08/11 0800  . ondansetron (ZOFRAN) injection 4 mg  4 mg Intravenous Q6H PRN Delight Ovens, MD      . ondansetron [facility]) tablet 4 mg  4 mg Oral Q6H PRN Kathlee Nations Trigt III, MD       Or  . ondansetron [facility]) injection 4 mg  4 mg Intravenous Q6H PRN Kathlee Nations Trigt III, MD      . pantoprazole (PROTONIX) EC tablet 40 mg  40 mg Oral Q1200 Delight Ovens, MD   40 mg at 01/09/11 1046  . pantoprazole (PROTONIX) EC tablet 40 mg  40 mg Oral QAC breakfast Kathlee Nations Trigt III, MD      . potassium chloride SA (K-DUR,KLOR-CON) CR tablet 20 mEq  20 mEq Oral Daily Kathlee Nations Trigt III, MD      . povidone-iodine (BETADINE) 10 % external solution 1 application  1 application Topical BID Kerin Perna III, MD   1 application at 01/09/11 2200  . simvastatin (ZOCOR) tablet 20 mg  20 mg Oral QHS Delight Ovens, MD   20 mg at 01/09/11 2233  . sodium chloride 0.9 % injection 3 mL  3 mL Intravenous Q12H Delight Ovens, MD   3 mL at 01/09/11 2237  . sodium chloride 0.9 % injection 3 mL  3 mL Intravenous PRN Delight Ovens, MD      . sodium chloride 0.9 % injection 3 mL  3 mL Intravenous Q12H Kathlee Nations Trigt III, MD      . traMADol Janean Sark) tablet 50-100 mg  50-100 mg Oral Q4H PRN Kathlee Nations Trigt III, MD      . DISCONTD: insulin aspart (novoLOG) injection 0-24 Units  0-24 Units Subcutaneous Q4H Kerin Perna III, MD   12 Units at 01/09/11 1231  .  DISCONTD: insulin glargine (LANTUS) injection 20 Units  20 Units Subcutaneous BID Purcell Nails, MD   20 Units at 01/09/11 1047  . DISCONTD: insulin regular (HUMULIN R,NOVOLIN R) 1 Units/mL in sodium chloride 0.9 % 100 mL infusion   Intravenous Continuous  PRN Kathlee Nations Trigt III, MD 1.5 mL/hr at 01/09/11 0200      PE: General appearance: alert, cooperative and no distress Lungs: clear to auscultation bilaterally Heart: regular rate and rhythm, S1, S2 normal, no murmur, click, rub or gallop Abdomen: soft, non-tender; bowel sounds normal; no masses,  no organomegaly Extremities: Trace posterior tibial edema Pulses: 2+ radials. 1+DP  Lab Results:   Basename 01/10/11 0505 01/09/11 0425 01/08/11 1714 01/08/11 1700  WBC 18.4* 24.0* -- 23.2*  HGB 11.2* 11.0* 10.9* --  HCT 32.3* 31.8* 32.0* --  PLT 77* 67* -- 67*   BMET  Basename 01/10/11 0505 01/09/11 0425 01/08/11 1714 01/08/11 0400  NA 140 137 138 --  K 3.8 3.8 3.6 --  CL 101 103 102 --  CO2 29 24 -- 21  GLUCOSE 118* 90 243* --  BUN 23 19 19  --  CREATININE 1.10 0.98 0.90 --  CALCIUM 8.6 8.8 -- 7.5*   PT/INR  Basename 01/07/11 1310  LABPROT 20.2*  INR 1.69*    Studies/Results: CXR: IMPRESSION:  Improving edema pattern. Residual pleural effusions and left lower  lobe atelectasis / consolidation   Tele: SR, 1st degree AV block. PACs  Assessment/Plan  Principal Problem:   *S/P aortic valve replacement. POD #3 #23 Magna Ease Edwards Pericardial Valve   Left Atrial Clip  Active Problems:  DIABETES MELLITUS, TYPE II (07/11/2007)  HYPERLIPIDEMIA (07/11/2007)  CAD (07/11/2007)  AS (aortic stenosis)   History of atrial fibrillation (01/08/2011)   Plan:  WBCs improved. BP stable and controlled.  HR in the 90's.  Ambulated  Yesterday behind wheel chair.  CXR improved.  On abx.     LOS: 3 days    HAGER,BRYAN W 01/10/2011 8:06 AM  Patient seen and examined. Agree with assessment and plan. Pt complains of pleuritic chest discomfort.  Soft rub on exam. Continue diuresis.                                                              Jetta Murray A 01/10/2011 1:20 PM

## 2011-01-10 NOTE — Discharge Summary (Signed)
Discharge Summary  Ronald [name] 1923/02/10 77 y.o. [id number]  01/07/2011    Admitting Diagnosis: aortic stenosis  Discharge Diagnosis:  Patient Active Problem List  Diagnoses  . DIABETES MELLITUS, TYPE II  . HYPERLIPIDEMIA  . DEPRESSION  . RESTLESS LEGS SYNDROME  . HYPERTENSION  . CAD  . PVD  . DIVERTICULOSIS, COLON  . RENAL CYST  . ARTHRITIS  . MURMUR  . CAROTID BRUIT  . SKIN CANCER, HX OF  . RHEUMATIC HEART DISEASE, HX OF  . AS (aortic stenosis)  . S/P aortic [name] replacement: #23 Magna Ease Ronald Pericardial [name]    . History of atrial fibrillation        [name]/O chronic renal insufficiency       Thrombocytopenia  Procedures: Aortic [name] replacement with a 23 mm Ronald magna Ease pericardial tissue [name] Clip of left atrial appendage Maze procedure  HPI:  Ronald patient is a 76 y.o. male who was referred by Dr. Nanetta [name] for consideration of aortic [name] replacement. Ronald patient notes increasing fatigue with exertion weakness in his arms and legs some episodes of lightheadedness but no frank syncope. He does note some exertional shortness of breath with exertion but denies frank anginal symptoms. He has no nocturnal dyspnea. Denies pedal edema. Cardiac catheterization showed no significant coronary artery disease and right heart cath revealed an aortic [name] area of 0.8. He was referred to Dr. Elizabeth [name] for consideration of aortic [name] replacement. After review of his studies Dr. Tyrone [name] agreed with Ronald need for surgical intervention and recommended aortic [name] replacement at this time.    Hospital Course:  Ronald patient was admitted to [facility] (Ronald [name])   On 01/07/2011..  All risks, benefits and alternatives of surgery were explained in detail, and Ronald patient agreed to proceed. Ronald patient was taken to Ronald operating room and underwent aortic [name] replacement as described above..  Ronald postoperative course was notable for Thrombocytopenia which had also been  noted preoperatively.Currently his platelets are 77,000 and he has not been restarted on Plavix. We will decide at Ronald time of discharge whether this can be restarted.  He has been followed postoperatively by Dr. Timothy [name] for management of his diabetes. He is currently being restarted on his home diabetes medications.He is maintaining normal sinus rhythm. He has remained afebrile and his vital signs are stable . He is ambulating in Ronald halls without difficulty. He is tolerating a regular diet.  Labs:  Ronald [name] 01/10/11 0505 01/09/11 0425  NA 140 137  K 3.8 3.8  CL 101 103  CO2 29 24  GLUCOSE 118* 90  BUN 23 19  CALCIUM 8.6 8.8    Basename 01/10/11 0505 01/09/11 0425  WBC 18.4* 24.0*  HGB 11.2* 11.0*  HCT 32.3* 31.8*  PLT 77* 67*   No results found for this basename: INR:2 in Ronald last 72 hours  Discharge Medications:  Ronald [name], Ronald [name]  Home Medication Instructions [id number]   Printed on:01/10/11 1623  Medication Information                    finasteride (PROSCAR) 5 MG tablet Take 5 mg by mouth daily.             simvastatin (ZOCOR) 20 MG tablet Take 20 mg by mouth at bedtime.             acetaminophen (TYLENOL) 500 MG tablet Take 2 tablets (1,000 mg total) by mouth every 6 (six) hours as needed. For pain  doxazosin (CARDURA) 4 MG tablet Take 1 tablet (4 mg total) by mouth at bedtime.           furosemide (LASIX) 40 MG tablet Take 1 tablet (40 mg total) by mouth daily.           glyBURIDE-metformin (GLUCOVANCE) 5-500 MG per tablet Take 0.5 tablets by mouth 2 (two) times daily with a meal.           loratadine (CLARITIN) 10 MG tablet Take 1 tablet (10 mg total) by mouth daily.           metoprolol tartrate (LOPRESSOR) 25 MG tablet Take 1 tablet (25 mg total) by mouth 2 (two) times daily.           potassium chloride SA (K-DUR,KLOR-CON) 20 MEQ tablet Take 1 tablet (20 mEq total) by mouth daily.           traMADol (ULTRAM) 50 MG tablet Take 1-2 tablets  (50-100 mg total) by mouth every 4 (four) hours as needed for pain. Maximum dose= 8 tablets per day              Discharge Instructions:  Ronald patient is asked to refrain from driving, heavy lifting or strenuous activity.  Continue ambulating daily and use of incentive spirometer.  Shower daily and clean incisions with soap and water.   Discharge Followup: he will followup as directed with Dr. Timothy [name]. He is to make an appointment to see Dr. Allyson [name] in 2 weeks. He'll see Dr. Tyrone [name] in 3 weeks with a chest x-ray.  Ronald [name] 01/10/2011, 4:23 PM

## 2011-01-10 NOTE — Consult Note (Signed)
Tobacco cessation- Pt smokes 2 ppd and says he plans to quit on his own. Also states that cigarettes are his friend but knows that they're not good for him. Discussed oral fixation substitutes, second hand smoke and in home smoking policy and hobbies he enjoys to replace smoking.  Referred pt to 1-800-quit-now. Pt says he doesn't need any written education information when offered.

## 2011-01-10 NOTE — Progress Notes (Signed)
3 Days Post-Op Procedure(s) (LRB): AORTIC VALVE REPLACEMENT (AVR) (N/A) and atrial clip Subjective: Ambulating well, still on nasal canula. Walking with help  Objective: Vital signs in last 24 hours: Temp:  [97.8 F (36.6 C)-98.7 F (37.1 C)] 97.8 F (36.6 C) (11/15 0501) Pulse Rate:  [79-100] 92  (11/15 0501) Cardiac Rhythm:  [-] Heart block (11/14 2000) Resp:  [18-25] 18  (11/15 0501) BP: (107-156)/(48-77) 125/69 mmHg (11/15 0501) SpO2:  [91 %-96 %] 94 % (11/15 0501) Weight:  [180 lb (81.647 kg)-197 lb 6.4 oz (89.54 kg)] 197 lb 6.4 oz (89.54 kg) (11/15 0501)  Hemodynamic parameters for last 24 hours:    Intake/Output from previous day: 11/14 0701 - 11/15 0700 In: 904 [P.O.:720; I.V.:180; IV Piggyback:4] Out: 2970 [Urine:2970] Intake/Output this shift:    General appearance: alert, cooperative and no distress Neurologic: intact Heart: regular rate and rhythm, S1, S2 normal, no murmur, click, rub or gallop, normal apical impulse, no click and no rub Lungs: clear to auscultation bilaterally Abdomen: soft, non-tender; bowel sounds normal; no masses,  no organomegaly Extremities: extremities normal, atraumatic, no cyanosis or edema and Homans sign is negative, no sign of DVT Wound: sternum stable without drainage  Lab Results:  Basename 01/10/11 0505 01/09/11 0425  WBC 18.4* 24.0*  HGB 11.2* 11.0*  HCT 32.3* 31.8*  PLT 77* 67*   BMET:  Basename 01/10/11 0505 01/09/11 0425  NA 140 137  K 3.8 3.8  CL 101 103  CO2 29 24  GLUCOSE 118* 90  BUN 23 19  CREATININE 1.10 0.98  CALCIUM 8.6 8.8    PT/INR:  Basename 01/07/11 1310  LABPROT 20.2*  INR 1.69*   ABG    Component Value Date/Time   PHART 7.373 01/07/2011 1810   HCO3 22.7 01/07/2011 1810   TCO2 21 01/08/2011 1714   ACIDBASEDEF 2.0 01/07/2011 1810   O2SAT 96.0 01/07/2011 1810   CBG (last 3)   Basename 01/10/11 0533 01/09/11 2100 01/09/11 1532  GLUCAP 124* 228* 253*    Assessment/Plan: S/P  Procedure(s) (LRB): AORTIC VALVE REPLACEMENT (AVR) (N/A) Mobilize Diabetes control Thrombocytopenia improving, restart plavix at  Before going home.  Poss DC home  11/17-18  LOS: 3 days    Melba Araki B 01/10/2011

## 2011-01-10 NOTE — Progress Notes (Signed)
UR Completed.  Heather Mckendree Jane 01/10/2011  

## 2011-01-10 NOTE — Progress Notes (Signed)
Subjective: Looking great on POD #3 No CP or SOB. CBGs have been high.  Better this am.  He is on lots of Insulin that he is not on at home (May need). No new C/O  Past History  Past Medical History (reviewed - no changes required): DM2 with Peripheral Neuropathy, Microalbuminuria/Vasular Complications, CAD, Severe PAD/Chronic leg Pain/Claudication with PCI/stenting to B SFA 2006 Current ABIs @ .5, RAS S/P PCI, Hyperlipidemia, Severe AS in Pt with H/O Rheumatic Heart Disease (Rheumatic Fever 1946 S/P 3 mon in hospital), H/O Renal Cyst, Divertisculosis. CRI/CKD Cr 1.2-1.5 Cr Cl 62. COPD/Tobacco abuse, thrombocytopenia. OA/Shoulder Pain. BPH, H/O Bladder Carcinoma - high grade urothelial carcinoma (high grade papillary urothelial carcinoma vs carcinoma in situ) S/P Operation and BCG, ED, Depression and Grief after his wife died  T12 comp F(x) due to trauma 1995  Peripheral Edema/Venous stasis doing well with stockings  Skin Cancer removed L Hand 03/2010 Dr Terri Piedra.  Surgical History (reviewed - no changes required): 03/24/02 PVD/PAD  R common, Right Ex illiac, PCA & Stents  L common, L Ex illiac  RAS  T &A  Nasal Polypectomy  B Cataract  Bladder Cancer S/P 06/13/08 Cystoscopy with transurethral resection of bladder lesion and random bladder biopsies. Local Excision BCG Rx 5/26   Objective: Vital signs in last 24 hours: Temp:  [97.8 F (36.6 C)-98.7 F (37.1 C)] 97.8 F (36.6 C) (11/15 0501) Pulse Rate:  [79-100] 92  (11/15 0501) Resp:  [18-25] 18  (11/15 0501) BP: (107-156)/(48-77) 125/69 mmHg (11/15 0501) SpO2:  [91 %-96 %] 92 % (11/15 0758) Weight:  [81.647 kg (180 lb)-89.54 kg (197 lb 6.4 oz)] 197 lb 6.4 oz (89.54 kg) (11/15 0501) Weight change: -8.653 kg (-19 lb 1.2 oz) Last BM Date: 01/09/11  Intake/Output from previous day: 11/14 0701 - 11/15 0700 In: 904 [P.O.:720; I.V.:180; IV Piggyback:4] Out: 2970 [Urine:2970] Intake/Output this shift:    PE) General appearance: alert  and oriented and looks better than expected Neurologic: intact  Heart: regular rate and rhythm, S1, S2 normal.  Tele NSR,  Lungs: diminished breath sounds bibasilar  Abdomen: soft, non-tender; bowel sounds normal; no masses, no organomegaly  Extremities: extremities normal but some edema. Wound: Patient's wound is per CVTS. Rash - dry skin L shoulder area looks fine.  Lab Results:  [facility] 01/10/11 0505 01/09/11 0425  WBC 18.4* 24.0*  HGB 11.2* 11.0*  HCT 32.3* 31.8*  PLT 77* 67*   BMET  Basename 01/10/11 0505 01/09/11 0425  NA 140 137  K 3.8 3.8  CL 101 103  CO2 29 24  GLUCOSE 118* 90  BUN 23 19  CREATININE 1.10 0.98  CALCIUM 8.6 8.8    Studies/Results: Dg Chest 2 View  01/10/2011  *RADIOLOGY REPORT*  Clinical Data: Coronary bypass, valve replacement  CHEST - 2 VIEW  Comparison: 01/09/2011  Findings: Today's exam is more upright.  The patient is status post median sternotomy and prosthetic heart valve.  Heart remains enlarged with improving edema pattern.  Residual effusions noted, larger on the left with associated left lower lobe collapse / consolidation.  No pneumothorax.  Trachea is midline.  Degenerative changes of the spine.  Right IJ vascular sheath removed. Chronic compression deformity of the T12 vertebral body.  IMPRESSION: Improving edema pattern.  Residual pleural effusions and left lower lobe atelectasis / consolidation  Original Report Authenticated By: Judie Petit. Ruel Favors, M.D.   Dg Chest Port 1 View  01/09/2011  *RADIOLOGY REPORT*  Clinical Data: Post cardiac surgery,  chest pain  PORTABLE CHEST - 1 VIEW  Comparison: 01/08/2011  Findings: Cardiomegaly with mild interstitial edema and bilateral pleural effusions.  Bilateral lower lobe opacities, likely atelectasis.  Cardiomegaly. Postsurgical changes.  Stable right IJ venous sheath.  Interval removal of Swan-Ganz catheter.  Lower mediastinal drains are not well visualized and may have been removed.  IMPRESSION:  Cardiomegaly with mild interstitial edema and bilateral pleural effusion. Bilateral lower lobe opacities, likely atelectasis.  Original Report Authenticated By: Charline Bills, M.D.     Assessment/Plan: Principal Problem:  *S/P aortic valve replacement Active Problems:  AS (aortic stenosis)  Problem # 1: AORTIC STENOSIS (ICD-424.1)  POD #2 AORTIC VALVE REPLACEMENT (AVR) with pericardial tissue valve and placement of the atrial clip   Severe AS in Pt with H/O Rheumatic Heart Disease (Rheumatic Fever 1946 S/P 3 mon in hospital),   Postop thrombocytopenia, patient has history of preoperative thrombocytopenia mild.  At 67 --Improved to 77 today.  Continue to follow. Mobilize, Diuresis as weight up 10 #s and some LE Edema.  patient so far is remaining in sinus rhythm.   Preoperatively the patient was on Plavix which will continue but because of his continued thrombocytopenia will wait on that. He is known to be allergic to aspirin including anaphylaxis. He is off Plavix and Celebrex at moment and I agree.   PT saw pt yesterday and said PT/OT evaluations completed.  Recommend 24 hour supervision/assist initially and HHPT/OT at D/C. Will follow acutely.    Problem # 2: ACCIDENTAL FALL (ICD-E888.9)  recent fall and injury in [place] 5 weeks ago.  Some shoulder issues can be worked on with PT.   Problem # 3: RENAL DISEASE, CHRONIC, MILD (ICD-585.2)  CRI/CKD Cr 1.02-1.5 Cr Cl 62.  Kidney function will need to be followed very carefully in the hospital. Current Cr excellent.   Problem # 4: DIABETES MELLITUS, TYPE II, CONTROLLED, W/VASCULAR COMPS (ICD-250.70)  CBG (last 3)   Basename 01/10/11 0533 01/09/11 2100 01/09/11 1532  GLUCAP 124* 228* 253*    he is off Glucovance and will restart sulfonurea only today and watch for lows.  I will probably place him back on Home med dosages by tomorrow. ISS. Back down on lantus as he was not on this at home.  #5.  Elevated WBC - coming down -  no signs of infection.  OutPt Complete Medication List:  1) Benazepril-hydrochlorothiazide 20-12.5 Mg Tabs (Benazepril-hydrochlorothiazide) .... 2 po qd  2) Flonase 50 Mcg/act Susp (Fluticasone propionate) .Marland Kitchen.. 1 spary in each nostril bid  3) Glucovance 5-500 Mg Tabs (Glyburide-metformin) .Marland Kitchen.. 1 tab by mouth in am and 1/2 in evening  4) Klor-con M20 20 Meq Cr-tabs (Potassium chloride crys cr) .Marland Kitchen.. 1 tab daily  5) Plavix 75 Mg Tabs (Clopidogrel bisulfate) .Marland Kitchen.. 1 tab daily  6) Proscar 5 Mg Tabs (Finasteride) .Marland Kitchen.. 1 tab daily  7) Toprol Xl 100 Mg Xr24h-tab (Metoprolol succinate) .Marland Kitchen.. 1 tab daily  8) Zocor 20 Mg Tabs (Simvastatin) .Marland Kitchen.. 1 tab daily  9) Norvasc 10 Mg Tabs (Amlodipine besylate) .Marland Kitchen.. 1 tablet by mouth daily  10) Doxazosin Mesylate 4 Mg Tabs (Doxazosin mesylate) .Marland Kitchen.. 1 tablet by mouth daily  11) Lasix 20 Mg Tabs (Furosemide) .Marland Kitchen.. 1 tablet by mouith daily  12) Celebrex 200 Mg Caps (Celecoxib) .Marland Kitchen.. 1 tablet by mouth as needed.  13) Onetouch Ultra Test Strp (Glucose blood) .... Self monitor blood glucose twice daily and as needed (dx 250.02)  14) Freestyle Test Strp (Glucose blood) .... Use 1  strip tid       LOS: 3 days   Calvary Difranco M 01/10/2011, 8:13 AM

## 2011-01-11 DIAGNOSIS — D72829 Elevated white blood cell count, unspecified: Secondary | ICD-10-CM

## 2011-01-11 DIAGNOSIS — D696 Thrombocytopenia, unspecified: Secondary | ICD-10-CM

## 2011-01-11 LAB — GLUCOSE, CAPILLARY
Glucose-Capillary: 147 mg/dL — ABNORMAL HIGH (ref 70–99)
Glucose-Capillary: 208 mg/dL — ABNORMAL HIGH (ref 70–99)
Glucose-Capillary: 132 mg/dL — ABNORMAL HIGH (ref 70–99)
Glucose-Capillary: 134 mg/dL — ABNORMAL HIGH (ref 70–99)

## 2011-01-11 MED ORDER — METFORMIN HCL 500 MG PO TABS
500.0000 mg | ORAL_TABLET | Freq: Two times a day (BID) | ORAL | Status: DC
  Administered 2011-01-11 – 2011-01-17 (×12): 500 mg via ORAL
  Filled 2011-01-11 (×15): qty 1

## 2011-01-11 MED ORDER — INSULIN GLARGINE 100 UNIT/ML ~~LOC~~ SOLN
15.0000 [IU] | Freq: Every day | SUBCUTANEOUS | Status: DC
  Administered 2011-01-11 – 2011-01-12 (×2): 15 [IU] via SUBCUTANEOUS
  Filled 2011-01-11: qty 3

## 2011-01-11 MED ORDER — GLYBURIDE 5 MG PO TABS
5.0000 mg | ORAL_TABLET | Freq: Two times a day (BID) | ORAL | Status: DC
  Administered 2011-01-11 – 2011-01-17 (×12): 5 mg via ORAL
  Filled 2011-01-11 (×15): qty 1

## 2011-01-11 MED ORDER — METOPROLOL TARTRATE 50 MG PO TABS: 50.0000 mg | ORAL_TABLET | Freq: Two times a day (BID) | ORAL | Status: DC

## 2011-01-11 MED ORDER — METOPROLOL TARTRATE 50 MG PO TABS
50.0000 mg | ORAL_TABLET | Freq: Two times a day (BID) | ORAL | Status: DC
  Administered 2011-01-11 – 2011-01-17 (×13): 50 mg via ORAL
  Filled 2011-01-11 (×14): qty 1

## 2011-01-11 MED ORDER — MOXIFLOXACIN HCL 400 MG PO TABS
400.0000 mg | ORAL_TABLET | Freq: Every day | ORAL | Status: DC
  Administered 2011-01-12 – 2011-01-17 (×6): 400 mg via ORAL
  Filled 2011-01-11 (×7): qty 1

## 2011-01-11 MED ORDER — FLUTICASONE-SALMETEROL 250-50 MCG/DOSE IN AEPB
1.0000 | INHALATION_SPRAY | Freq: Two times a day (BID) | RESPIRATORY_TRACT | Status: DC
  Administered 2011-01-11 – 2011-01-17 (×12): 1 via RESPIRATORY_TRACT
  Filled 2011-01-11: qty 14

## 2011-01-11 NOTE — Progress Notes (Signed)
4 Days Post-Op Procedure(s) (LRB): AORTIC VALVE REPLACEMENT (AVR) (N/A) Subjective: Getting stronger WBC elevated w/o feveIncision clean  Objective: Vital signs in last 24 hours: Temp:  [98.9 F (37.2 C)-99.3 F (37.4 C)] 99.3 F (37.4 C) (11/16 0450) Pulse Rate:  [90-103] 98  (11/16 0450) Cardiac Rhythm:  [-] Heart block (11/15 1900) Resp:  [19-22] 22  (11/16 0450) BP: (120-137)/(56-69) 137/69 mmHg (11/16 0450) SpO2:  [91 %-95 %] 92 % (11/16 0744) Weight:  [186 lb 1.1 oz (84.4 kg)] 186 lb 1.1 oz (84.4 kg) (11/16 0450)  Hemodynamic parameters for last 24 hours:    Intake/Output from previous day: 11/15 0701 - 11/16 0700 In: 726 [P.O.:720; I.V.:6] Out: 850 [Urine:850] Intake/Output this shift: Total I/O In: 120 [P.O.:120] Out: -   EXAM  NSR neuro intact  Lab Results:  Basename 01/10/11 0505 01/09/11 0425  WBC 18.4* 24.0*  HGB 11.2* 11.0*  HCT 32.3* 31.8*  PLT 77* 67*   BMET:  Basename 01/10/11 0505 01/09/11 0425  NA 140 137  K 3.8 3.8  CL 101 103  CO2 29 24  GLUCOSE 118* 90  BUN 23 19  CREATININE 1.10 0.98  CALCIUM 8.6 8.8    PT/INR: No results found for this basename: LABPROT,INR in the last 72 hours ABG    Component Value Date/Time   PHART 7.373 01/07/2011 1810   HCO3 22.7 01/07/2011 1810   TCO2 21 01/08/2011 1714   ACIDBASEDEF 2.0 01/07/2011 1810   O2SAT 96.0 01/07/2011 1810   CBG (last 3)   Basename 01/11/11 0630 01/10/11 2054 01/10/11 1632  GLUCAP 147* 212* 176*    Assessment/Plan: S/P Procedure(s) (LRB): AORTIC VALVE REPLACEMENT (AVR) (N/A) Cont current care, follow WBC,plt ct   LOS: 4 days    VAN TRIGT III,Ayelen Sciortino 01/11/2011

## 2011-01-11 NOTE — Progress Notes (Signed)
1110 - 1210 pt education done for d/c with family and pt. Video for recovery put on for pt and family. Permission to refer to out pt card rehab [place].   Rosalie Doctor

## 2011-01-11 NOTE — Progress Notes (Signed)
[facility]  Subjective: Right-sided CP that he experienced yesterday has improved.  Ambulated yesterday and felt good.  A little SOB towards the end.  Feels hot and sweaty this AM.  Objective: Vital signs in last 24 hours: Temp:  [98.9 F (37.2 C)-99.3 F (37.4 C)] 99.3 F (37.4 C) (11/16 0450) Pulse Rate:  [90-103] 98  (11/16 0450) Resp:  [19-22] 22  (11/16 0450) BP: (120-137)/(56-69) 137/69 mmHg (11/16 0450) SpO2:  [91 %-95 %] 92 % (11/16 0744) Weight:  [84.4 kg (186 lb 1.1 oz)] 186 lb 1.1 oz (84.4 kg) (11/16 0450) Last BM Date: 01/09/11  Intake/Output from previous day: 11/15 0701 - 11/16 0700 In: 726 [P.O.:720; I.V.:6] Out: 850 [Urine:850] Intake/Output this shift:    Medications Current Facility-Administered Medications  Medication Dose Route Frequency Provider Last Rate Last Dose  . 0.45 % sodium chloride infusion   Intravenous Continuous Delight Ovens, MD 20 mL/hr at 01/09/11 1600    . 0.9 %  sodium chloride infusion   Intravenous Continuous Delight Ovens, MD 20 mL/hr at 01/08/11 0400    . 0.9 %  sodium chloride infusion  250 mL Intravenous Continuous Delight Ovens, MD      . acetaminophen (TYLENOL) tablet 1,000 mg  1,000 mg Oral Q6H Delight Ovens, MD   1,000 mg at 01/11/11 1610   Or  . acetaminophen (TYLENOL) solution 975 mg  975 mg Per Tube Q6H Delight Ovens, MD      . acetaminophen (TYLENOL) tablet 650 mg  650 mg Oral Q6H PRN Kathlee Nations Trigt III, MD      . albumin human 5 % solution 250 mL  250 mL Intravenous Q15 min PRN Delight Ovens, MD   250 mL at 01/07/11 1340  . albuterol (PROVENTIL HFA;VENTOLIN HFA) 108 (90 BASE) MCG/ACT inhaler 2 puff  2 puff Inhalation Q6H Delight Ovens, MD   2 puff at 01/11/11 0744  . alum & mag hydroxide-simeth (MAALOX PLUS) 400-400-40 MG/5ML suspension 15-30 mL  15-30 mL Oral Q4H PRN Kathlee Nations Trigt III, MD      . bisacodyl (DULCOLAX) EC tablet 10 mg  10 mg Oral Daily PRN Kathlee Nations Trigt III, MD       Or  . bisacodyl (DULCOLAX) suppository 10 mg  10 mg Rectal Daily PRN Kathlee Nations Trigt III, MD      . cefUROXime (ZINACEF) 1.5 g in dextrose 5 % 50 mL IVPB  1.5 g Intravenous Once Elwin Sleight, PHARMD      . docusate sodium (COLACE) capsule 200 mg  200 mg Oral Daily Kathlee Nations Trigt III, MD      . finasteride (PROSCAR) tablet 5 mg  5 mg Oral Daily Delight Ovens, MD   5 mg at 01/10/11 1000  . glyBURIDE (DIABETA) tablet 2.5 mg  2.5 mg Oral BID WC [name], MD   2.5 mg at 01/10/11 1642  . guaiFENesin (MUCINEX) 12 hr tablet 600 mg  600 mg Oral Q12H PRN Kathlee Nations Trigt III, MD      . insulin aspart (novoLOG) injection 0-24 Units  0-24 Units Subcutaneous TID AC & HS Kerin Perna III, MD   8 Units at 01/10/11 2204  . insulin glargine (LANTUS) injection 20 Units  20 Units Subcutaneous QHS [name], MD   20 Units at 01/10/11 2311  . magnesium hydroxide (MILK OF MAGNESIA) suspension 30 mL  30 mL Oral  Daily PRN Kathlee Nations Trigt III, MD      . metoprolol (LOPRESSOR) injection 2.5-5 mg  2.5-5 mg Intravenous Q2H PRN Delight Ovens, MD      . metoprolol tartrate (LOPRESSOR) tablet 25 mg  25 mg Oral BID Kathlee Nations Trigt III, MD   25 mg at 01/10/11 2204  . morphine 4 MG/ML injection 2-5 mg  2-5 mg Intravenous Q1H PRN Delight Ovens, MD   2 mg at 01/08/11 0319  . nitroGLYCERIN 0.2 mg/mL in dextrose 5 % infusion  0-100 mcg/min Intravenous Continuous Kathlee Nations Trigt III, MD 6 mL/hr at 01/08/11 0800 20 mcg/min at 01/08/11 0800  . ondansetron (ZOFRAN) tablet 4 mg  4 mg Oral Q6H PRN Kathlee Nations Trigt III, MD       Or  . ondansetron [facility]) injection 4 mg  4 mg Intravenous Q6H PRN Kathlee Nations Trigt III, MD      . pantoprazole (PROTONIX) EC tablet 40 mg  40 mg Oral QAC breakfast Kathlee Nations Trigt III, MD   40 mg at 01/10/11 0806  . potassium chloride SA (K-DUR,KLOR-CON) CR tablet 20 mEq  20 mEq Oral Daily Kathlee Nations Trigt III, MD   20 mEq at 01/10/11 1001  . povidone-iodine (BETADINE)  10 % external solution 1 application  1 application Topical BID Kerin Perna III, MD   1 application at 01/10/11 2317  . simvastatin (ZOCOR) tablet 20 mg  20 mg Oral QHS Delight Ovens, MD   20 mg at 01/10/11 2204  . sodium chloride 0.9 % injection 3 mL  3 mL Intravenous Q12H Delight Ovens, MD   3 mL at 01/10/11 2314  . sodium chloride 0.9 % injection 3 mL  3 mL Intravenous PRN Delight Ovens, MD      . sodium chloride 0.9 % injection 3 mL  3 mL Intravenous Q12H Kathlee Nations Trigt III, MD   3 mL at 01/10/11 2200  . traMADol (ULTRAM) tablet 50-100 mg  50-100 mg Oral Q4H PRN Kathlee Nations Trigt III, MD   100 mg at 01/10/11 2023  . DISCONTD: bisacodyl (DULCOLAX) EC tablet 10 mg  10 mg Oral Daily Delight Ovens, MD   10 mg at 01/09/11 1045  . DISCONTD: bisacodyl (DULCOLAX) suppository 10 mg  10 mg Rectal Daily Delight Ovens, MD      . DISCONTD: docusate sodium (COLACE) capsule 200 mg  200 mg Oral Daily Delight Ovens, MD   200 mg at 01/09/11 1046  . DISCONTD: insulin glargine (LANTUS) injection 25 Units  25 Units Subcutaneous BID [name], MD   25 Units at 01/09/11 2124  . DISCONTD: metoprolol tartrate (LOPRESSOR) 25 mg/10 mL oral suspension 12.5 mg  12.5 mg Per Tube BID Kathlee Nations Trigt III, MD   12.5 mg at 01/09/11 2233  . DISCONTD: metoprolol tartrate (LOPRESSOR) tablet 25 mg  25 mg Oral BID Kathlee Nations Trigt III, MD   25 mg at 01/09/11 1046  . DISCONTD: ondansetron (ZOFRAN) injection 4 mg  4 mg Intravenous Q6H PRN Delight Ovens, MD      . DISCONTD: pantoprazole (PROTONIX) EC tablet 40 mg  40 mg Oral Q1200 Delight Ovens, MD   40 mg at 01/09/11 1046    PE: General appearance: alert, cooperative and no distress Lungs: clear to auscultation bilaterally Heart: irregularly irregular rhythm and no MM Abdomen: soft, non-tender; bowel sounds normal; no masses,  no organomegaly Extremities: No LEE  Lab Results:   Basename 01/10/11 0505 01/09/11 0425 01/08/11 1714 01/08/11  1700  WBC 18.4* 24.0* -- 23.2*  HGB 11.2* 11.0* 10.9* --  HCT 32.3* 31.8* 32.0* --  PLT 77* 67* -- 67*   BMET  Basename 01/10/11 0505 01/09/11 0425 01/08/11 1714  NA 140 137 138  K 3.8 3.8 3.6  CL 101 103 102  CO2 29 24 --  GLUCOSE 118* 90 243*  BUN 23 19 19   CREATININE 1.10 0.98 0.90  CALCIUM 8.6 8.8 --   PT/INR No results found for this basename: LABPROT:3,INR:3 in the last 72 hours Cholesterol No results found for this basename: CHOL in the last 72 hours Cardiac Enzymes No components found with this basename: TROPONIN:3, CKMB:3  Studies/Results: CXR(01/10/11) Findings: Today's exam is more upright. The patient is status post  median sternotomy and prosthetic heart valve. Heart remains  enlarged with improving edema pattern. Residual effusions noted,  larger on the left with associated left lower lobe collapse /  consolidation. No pneumothorax. Trachea is midline. Degenerative  changes of the spine. Right IJ vascular sheath removed. Chronic  compression deformity of the T12 vertebral body.  IMPRESSION:  Improving edema pattern. Residual pleural effusions and left lower  lobe atelectasis / consolidation  Tele: rate 104, first degree AV block, PACs  Assessment/Plan  Principal Problem:   *S/P aortic valve replacement: #23 Magna Ease Edwards Pericardial Valve   Active Problems:   DIABETES MELLITUS, TYPE II   HYPERLIPIDEMIA   CAD   AS (aortic stenosis)   History of atrial fibrillation  Leukocytosis  Thrombocytopenia  Plan:  HR still elevated 100's.    BP in the 130's.  Will increase lopressor to 50mg  bid.  Leukocytosis improving.  CXR improving.  Minimal improvement in platelets yesterday.                                      LOS: 4 days    HAGER,BRYAN W 01/11/2011 8:08 AM  Agree with note written by Jones Skene PAC  Pt is POD #4 pericardia AVR. NRS. VSS. Meds adjusted. Exam benign. Ambulating. D/C per TCTS. ROV with me in 2-3 weeks.  [name] 01/11/2011 9:34 AM

## 2011-01-11 NOTE — Progress Notes (Signed)
MET WITH PT AND BROTHER TO DISCUSS DC PLANS.  PTA, PT INDEPENDENT, LIVES ALONE.   PT'S BROTHER AND CHILDREN TO ASSIST AT DISCHARGE.  MAY NEED HHRN FOR RESTORATIVE CARE, DEPENDING ON PROGRESS.  WILL FOLLOW.    Jerrell Belfast, RN, BSN Pager # [phone]

## 2011-01-11 NOTE — Progress Notes (Addendum)
Physical Therapy Treatment Patient Details Name: SULTAN PARGAS MRN: [id number] DOB: 1923/01/06 Today's Date: 01/11/2011  PT Assessment/Plan  PT - Assessment/Plan Comments on Treatment Session: Patient fatigued with gait.  Improvement continues. PT Plan: Discharge plan remains appropriate;Frequency remains appropriate PT Frequency: Min 3X/week Follow Up Recommendations: Home health PT;24 hour supervision/assistance Equipment Recommended: None recommended by PT PT Goals  Acute Rehab PT Goals PT Transfer Goal: Sit to Stand/Stand to Sit - Progress: Progressing toward goal PT Goal: Ambulate - Progress: Progressing toward goal  PT Treatment Precautions/Restrictions  Precautions Precautions: Sternal Precaution Comments: Reviewed with patient sternal precautions Required Braces or Orthoses: No Restrictions Weight Bearing Restrictions: Yes RUE Weight Bearing: limited weight bearing LUE Weight Bearing: limited weight bearing Other Position/Activity Restrictions: due to sternal precautions Mobility (including Balance) Bed Mobility Bed Mobility: No (Patient up in recliner) Transfers Transfers: Yes Sit to Stand: 4: Min assist;From chair/3-in-1;From toilet Sit to Stand Details (indicate cue type and reason): Cues to scoot to edge of chair and minimize use of UE's Stand to Sit: 4: Min assist;To chair/3-in-1;To toilet Ambulation/Gait Ambulation/Gait: Yes Ambulation/Gait Assistance: 5: Supervision Ambulation/Gait Assistance Details (indicate cue type and reason): Cues to stand tall and slow gait speed to minimize dyspnea Ambulation Distance (Feet): 175 Feet Assistive device: Rolling walker Gait Pattern: Step-through pattern;Decreased stride length;Trunk flexed Gait velocity: Slow gait speed Stairs: No      End of Session PT - End of Session Equipment Utilized During Treatment: Gait belt Activity Tolerance: Patient tolerated treatment well Patient left: in chair Nurse  Communication: Mobility status for ambulation General Behavior During Session: [facility] for tasks performed Cognition: [facility] for tasks performed  Vena Austria [phone] 01/11/2011, 11:40 AM

## 2011-01-11 NOTE — Progress Notes (Addendum)
  4 Days Post-Op Procedure(s) (LRB): AORTIC VALVE REPLACEMENT (AVR) (N/A) Subjective: Gets SOB with exertion.  Still on 2L O2.  + nonproductive cough.     Objective: Vital signs in last 24 hours: Patient Vitals for the past 24 hrs:  BP Temp Temp src Pulse Resp SpO2 Height Weight  01/11/11 0744 - - - - - 92 % - -  01/11/11 0450 137/69 mmHg 99.3 F (37.4 C) Oral 98  22  91 % 5\' 11"  (1.803 m) 186 lb 1.1 oz (84.4 kg)  01/11/11 0157 - - - 90  20  93 % - -  01/10/11 2100 126/69 mmHg 98.9 F (37.2 C) Oral 103  20  95 % - -  01/10/11 1922 - - - 103  22  95 % - -  01/10/11 1326 120/56 mmHg - Oral 90  19  95 % - -  01/10/11 0758 - - - - - 92 % - -    Hemodynamic parameters for last 24 hours:    Intake/Output from previous day: 11/15 0701 - 11/16 0700 In: 726 [P.O.:720; I.V.:6] Out: 850 [Urine:850] Intake/Output this shift:    General appearance: no distress Heart: regular rate and rhythm Lungs: Few coarse BS which clear with cough Extremities: edema mild bilateral LE Wound: Clean and dry  Lab Results: CBC: Basename 01/10/11 0505 01/09/11 0425  WBC 18.4* 24.0*  HGB 11.2* 11.0*  HCT 32.3* 31.8*  PLT 77* 67*   BMET:  Basename 01/10/11 0505 01/09/11 0425  NA 140 137  K 3.8 3.8  CL 101 103  CO2 29 24  GLUCOSE 118* 90  BUN 23 19  CREATININE 1.10 0.98  CALCIUM 8.6 8.8    PT/INR: No results found for this basename: LABPROT,INR in the last 72 hours ABG:  INR: Will add last result for INR, ABG once components are confirmed Will add last 4 CBG results once components are confirmed  Assessment/Plan: S/P Procedure(s) (LRB): AORTIC VALVE REPLACEMENT (AVR) (N/A) CV- stable.  Continue current meds. DM- followed by Dr. Timothy Lasso.  Could probably wean and D/C Lantus and resume Metformin. Pulm- continue pulm toilet and wean O2. Thrombocytopenia -no labs today.  Will recheck in am.  Hopefully can resume Plavix at D/C if platelets continue to improve. ? Home in am if remains  stable.   LOS: 4 days    Luian Schumpert H 01/11/2011    ADDENDUM:  Pt tachypneic with productive cough.  In light of symptoms and elevated WBC, will start empiric abx for presumed bronchitis.  Check sputum cx, repeat labs, CXR in am.

## 2011-01-12 ENCOUNTER — Inpatient Hospital Stay (HOSPITAL_COMMUNITY): Payer: Medicare Other

## 2011-01-12 LAB — BASIC METABOLIC PANEL
BUN: 24 mg/dL — ABNORMAL HIGH (ref 6–23)
CO2: 23 mEq/L (ref 19–32)
Calcium: 9.2 mg/dL (ref 8.4–10.5)
Chloride: 104 mEq/L (ref 96–112)
Creatinine, Ser: 0.91 mg/dL (ref 0.50–1.35)
GFR calc Af Amer: 86 mL/min — ABNORMAL LOW (ref >90)
GFR calc non Af Amer: 75 mL/min — ABNORMAL LOW (ref >90)
Glucose, Bld: 169 mg/dL — ABNORMAL HIGH (ref 70–99)
Potassium: 4.3 mEq/L (ref 3.5–5.1)
Sodium: 139 mEq/L (ref 135–145)

## 2011-01-12 LAB — EXPECTORATED SPUTUM ASSESSMENT W GRAM STAIN, RFLX TO RESP C

## 2011-01-12 LAB — CBC
HCT: 32.3 % — ABNORMAL LOW (ref 39.0–52.0)
Hemoglobin: 11.1 g/dL — ABNORMAL LOW (ref 13.0–17.0)
MCHC: 34.4 g/dL (ref 30.0–36.0)
Platelets: 116 10*3/uL — ABNORMAL LOW (ref 150–400)

## 2011-01-12 LAB — GLUCOSE, CAPILLARY
Glucose-Capillary: 154 mg/dL — ABNORMAL HIGH (ref 70–99)
Glucose-Capillary: 196 mg/dL — ABNORMAL HIGH (ref 70–99)
Glucose-Capillary: 169 mg/dL — ABNORMAL HIGH (ref 70–99)
Glucose-Capillary: 204 mg/dL — ABNORMAL HIGH (ref 70–99)
Glucose-Capillary: 243 mg/dL — ABNORMAL HIGH (ref 70–99)

## 2011-01-12 MED ORDER — FUROSEMIDE 40 MG PO TABS
40.0000 mg | ORAL_TABLET | Freq: Every day | ORAL | Status: DC
  Filled 2011-01-12: qty 1

## 2011-01-12 MED ORDER — GUAIFENESIN ER 600 MG PO TB12
600.0000 mg | ORAL_TABLET | Freq: Two times a day (BID) | ORAL | Status: DC
  Administered 2011-01-12 – 2011-01-17 (×11): 600 mg via ORAL
  Filled 2011-01-12 (×13): qty 1

## 2011-01-12 MED ORDER — FUROSEMIDE 40 MG PO TABS
40.0000 mg | ORAL_TABLET | Freq: Every day | ORAL | Status: DC
  Administered 2011-01-12: 40 mg via ORAL
  Filled 2011-01-12: qty 1

## 2011-01-12 MED ORDER — BENZONATATE 100 MG PO CAPS
200.0000 mg | ORAL_CAPSULE | Freq: Three times a day (TID) | ORAL | Status: DC | PRN
  Filled 2011-01-12: qty 2

## 2011-01-12 MED ORDER — FUROSEMIDE 40 MG PO TABS
40.0000 mg | ORAL_TABLET | Freq: Every day | ORAL | Status: DC
  Administered 2011-01-13 – 2011-01-17 (×5): 40 mg via ORAL
  Filled 2011-01-12 (×5): qty 1

## 2011-01-12 MED ORDER — METOPROLOL TARTRATE 12.5 MG HALF TABLET
12.5000 mg | ORAL_TABLET | Freq: Once | ORAL | Status: DC
  Filled 2011-01-12: qty 1

## 2011-01-12 MED ORDER — TRAMADOL HCL 50 MG PO TABS: 50.0000 mg | ORAL_TABLET | Freq: Four times a day (QID) | ORAL | Status: DC | PRN

## 2011-01-12 NOTE — Progress Notes (Signed)
Subjective:  No CP or SOB. Cough and some wound issues. CBGs some better    Past History  Past Medical History (reviewed - no changes required): DM2 with Peripheral Neuropathy, Microalbuminuria/Vasular Complications, CAD, Severe PAD/Chronic leg Pain/Claudication with PCI/stenting to B SFA 2006 Current ABIs @ .5, RAS S/P PCI, Hyperlipidemia, Severe AS in Pt with H/O Rheumatic Heart Disease (Rheumatic Fever 1946 S/P 3 mon in hospital), H/O Renal Cyst, Divertisculosis. CRI/CKD Cr 1.2-1.5 Cr Cl 62. COPD/Tobacco abuse, thrombocytopenia. OA/Shoulder Pain. BPH, H/O Bladder Carcinoma - high grade urothelial carcinoma (high grade papillary urothelial carcinoma vs carcinoma in situ) S/P Operation and BCG, ED, Depression and Grief after his wife died  T12 comp F(x) due to trauma 1995  Peripheral Edema/Venous stasis doing well with stockings  Skin Cancer removed L Hand 03/2010 Dr Terri Piedra.  Surgical History (reviewed - no changes required): 03/24/02 PVD/PAD  R common, Right Ex illiac, PCA & Stents  L common, L Ex illiac  RAS  T &A  Nasal Polypectomy  B Cataract  Bladder Cancer S/P 06/13/08 Cystoscopy with transurethral resection of bladder lesion and random bladder biopsies. Local Excision BCG Rx 5/26   Objective: Vital signs in last 24 hours: Temp:  [97.4 F (36.3 C)-98.7 F (37.1 C)] 98.5 F (36.9 C) (11/17 0338) Pulse Rate:  [78-96] 96  (11/17 1030) Resp:  [18-28] 28  (11/17 0338) BP: (115-130)/(61-74) 120/68 mmHg (11/17 1030) SpO2:  [91 %-95 %] 94 % (11/17 0859) Weight:  [88.6 kg (195 lb 5.2 oz)] 195 lb 5.2 oz (88.6 kg) (11/17 0338) Weight change: 4.2 kg (9 lb 4.2 oz) Last BM Date: 01/10/11  Intake/Output from previous day: 11/16 0701 - 11/17 0700 In: 840 [P.O.:840] Out: 576 [Urine:575; Stool:1] Intake/Output this shift: Total I/O In: 240 [P.O.:240] Out: -   PE) General appearance: alert and oriented and looks better than expected Neurologic: intact  Heart: regular rate and rhythm,  S1, S2 normal.  Tele NSR,  Lungs: diminished breath sounds bibasilar.  Cough Abdomen: soft, non-tender; bowel sounds normal; no masses, no organomegaly  Extremities: extremities normal but some edema. Wound: Patient's wound is per CVTS.  Lab Results:  [facility] 01/12/11 0650 01/10/11 0505  WBC 11.9* 18.4*  HGB 11.1* 11.2*  HCT 32.3* 32.3*  PLT 116* 77*   BMET  Basename 01/12/11 0650 01/10/11 0505  NA 139 140  K 4.3 3.8  CL 104 101  CO2 23 29  GLUCOSE 169* 118*  BUN 24* 23  CREATININE 0.91 1.10  CALCIUM 9.2 8.6    Studies/Results: Dg Chest 2 View  01/12/2011  *RADIOLOGY REPORT*  Clinical Data: Shortness of breath.  Status post CABG.  CHEST - 2 VIEW  Comparison: PA and lateral chest 01/10/2011.  Findings: Small bilateral pleural effusions and basilar atelectasis, greater on the left persist.  There is cardiomegaly but no pulmonary edema.  No pneumothorax identified.  IMPRESSION: No marked change in small bilateral pleural effusions and basilar atelectasis, greater on the left.  Original Report Authenticated By: Bernadene Bell. Maricela Curet, M.D.     Assessment/Plan: Principal Problem:  *S/P aortic valve replacement: #23 Magna Ease Edwards Pericardial Valve   Active Problems:  DIABETES MELLITUS, TYPE II  HYPERLIPIDEMIA  CAD  AS (aortic stenosis)  History of atrial fibrillation  Thrombocytopenia  Leukocytosis  Problem # 1: AORTIC STENOSIS (ICD-424.1)  POD #2 AORTIC VALVE REPLACEMENT (AVR) with pericardial tissue valve and placement of the atrial clip   Severe AS in Pt with H/O Rheumatic Heart Disease (Rheumatic Fever 1946  S/P 3 mon in hospital),   patient so far is remaining in sinus rhythm.    Recommend 24 hour supervision/assist initially and HHPT/OT at D/C.   Doing well on Post op day 5. Home soon?  Watch sternal drainage. Paint incision with Betadine and cover.  Diurese a little more.  Problem # 2: ACCIDENTAL FALL (ICD-E888.9)  recent fall and injury in [place] 5-6  weeks ago.   shoulder issues seems better.   Problem # 3: RENAL DISEASE, CHRONIC, MILD (ICD-585.2)  CRI/CKD Cr 1.02-1.5 Cr Cl 62.   Current Cr = .91 and excellent.    Problem # 4: DIABETES MELLITUS, TYPE II, CONTROLLED, W/VASCULAR COMPS (ICD-250.70)  CBG (last 3)   Basename 01/12/11 0555 01/11/11 2137 01/11/11 1633  GLUCAP 154* 134* 132*    On Orals and Lantus and ISS.  CBGs are improving. Attempting to back down on lantus as he was not on this at home.  #5.  Elevated WBC/ Agree with Continuing Avelox D2 for presumed bronchitis. WBC down today. On mucolytics, inhalers, IS, FV.  Add tessalon.   #6  Thrombocytopenia - plts improving. Consider restarting Plavix at D/C    OutPt Complete Medication List:  1) Benazepril-hydrochlorothiazide 20-12.5 Mg Tabs (Benazepril-hydrochlorothiazide) .... 2 po qd  2) Flonase 50 Mcg/act Susp (Fluticasone propionate) .Marland Kitchen.. 1 spary in each nostril bid  3) Glucovance 5-500 Mg Tabs (Glyburide-metformin) .Marland Kitchen.. 1 tab by mouth in am and 1/2 in evening  4) Klor-con M20 20 Meq Cr-tabs (Potassium chloride crys cr) .Marland Kitchen.. 1 tab daily  5) Plavix 75 Mg Tabs (Clopidogrel bisulfate) .Marland Kitchen.. 1 tab daily  6) Proscar 5 Mg Tabs (Finasteride) .Marland Kitchen.. 1 tab daily  7) Toprol Xl 100 Mg Xr24h-tab (Metoprolol succinate) .Marland Kitchen.. 1 tab daily  8) Zocor 20 Mg Tabs (Simvastatin) .Marland Kitchen.. 1 tab daily  9) Norvasc 10 Mg Tabs (Amlodipine besylate) .Marland Kitchen.. 1 tablet by mouth daily  10) Doxazosin Mesylate 4 Mg Tabs (Doxazosin mesylate) .Marland Kitchen.. 1 tablet by mouth daily  11) Lasix 20 Mg Tabs (Furosemide) .Marland Kitchen.. 1 tablet by mouith daily  12) Celebrex 200 Mg Caps (Celecoxib) .Marland Kitchen.. 1 tablet by mouth as needed.  13) Onetouch Ultra Test Strp (Glucose blood) .... Self monitor blood glucose twice daily and as needed (dx 250.02)  14) Freestyle Test Strp (Glucose blood) .... Use 1 strip tid       LOS: 5 days   [place] M 01/12/2011, 11:16 AM

## 2011-01-12 NOTE — Progress Notes (Addendum)
5 Days Post-Op Procedure(s) (LRB): AORTIC VALVE REPLACEMENT (AVR) (N/A)  Subjective: Still coughing a lot, mostly non-productive.  Very sore in chest.  Developed drainage from sternal wound after coughing.   Objective: Vital signs in last 24 hours: Patient Vitals for the past 24 hrs:  BP Temp Temp src Pulse Resp SpO2 Weight  01/12/11 0859 - - - - - 94 % -  01/12/11 0338 123/72 mmHg 98.5 F (36.9 C) Oral 91  28  93 % 195 lb 5.2 oz (88.6 kg)  01/12/11 0204 - - - - - 95 % -  01/11/11 2215 - - - - - 95 % -  01/11/11 2145 130/74 mmHg 98.7 F (37.1 C) Oral 88  22  94 % -  01/11/11 1623 119/71 mmHg 97.4 F (36.3 C) Oral 78  19  95 % -  01/11/11 1511 117/63 mmHg - - 80  18  94 % -  01/11/11 1455 116/67 mmHg - - 79  20  94 % -  01/11/11 1355 115/61 mmHg 98.2 F (36.8 C) Oral 82  20  91 % -  01/11/11 1128 - - - 82  - - -  01/11/11 1121 - - - 96  - - -  01/11/11 1108 - - - 84  - - -   Current Weight  01/12/11 195 lb 5.2 oz (88.6 kg)    Hemodynamic parameters for last 24 hours:    Intake/Output from previous day: 11/16 0701 - 11/17 0700 In: 840 [P.O.:840] Out: 576 [Urine:575; Stool:1] Intake/Output this shift: Total I/O In: 240 [P.O.:240] Out: -   PHYSICAL EXAM:  Heart: RRR Lungs: Diminished BS bilaterally, no rales or rhonchi Wound: Serosanguinous drainage from lower aspect of sternum, no erythema or instability Extremities: trace LE edema  Lab Results: CBC: Basename 01/12/11 0650 01/10/11 0505  WBC 11.9* 18.4*  HGB 11.1* 11.2*  HCT 32.3* 32.3*  PLT 116* 77*   BMET:  Basename 01/12/11 0650 01/10/11 0505  NA 139 140  K 4.3 3.8  CL 104 101  CO2 23 29  GLUCOSE 169* 118*  BUN 24* 23  CREATININE 0.91 1.10  CALCIUM 9.2 8.6    PT/INR: No results found for this basename: LABPROT,INR in the last 72 hours  Chest x-ray: stable bilateral effusions and atelectasis, L>R    Assessment/Plan: S/P Procedure(s) (LRB): AORTIC VALVE REPLACEMENT (AVR) (N/A)  Pulm-  Continue Avelox D2 for presumed bronchitis.  WBC down today.  On mucolytics, inhalers, IS, FV CV- Stable, SR DM- sugars still slightly elevated.  Will continue po meds plus Lantus for now. Thrombocytopenia - plts improving.  Consider restarting Plavix at D/C Continue current care. Watch sternal drainage.  Paint incision with Betadine and cover.   LOS: 5 days    COLLINS,GINA H 01/12/2011    I have seen and examined the patient and agree with the assessment and plan as outlined.  OWEN,CLARENCE H

## 2011-01-12 NOTE — Progress Notes (Signed)
Physical Therapy Treatment Patient Details Name: Ronald [name] MRN: [id number] DOB: [birth year] Today's Date: 01/12/2011  PT Assessment/Plan  PT - Assessment/Plan Comments on Treatment Session: Patient continues to progress.  Continues to need cues for sternal precautions and safety.   PT Plan: Discharge plan remains appropriate;Frequency remains appropriate Follow Up Recommendations: Home health PT;24 hour supervision/assistance Equipment Recommended: None recommended by PT PT Goals  Acute Rehab PT Goals PT Goal: Rolling Supine to Left Side - Progress: Other (comment) PT Transfer Goal: Sit to Stand/Stand to Sit - Progress: Progressing toward goal PT Goal: Ambulate - Progress: Progressing toward goal PT Goal: Up/Down Stairs - Progress: Other (comment)  PT Treatment Precautions/Restrictions  Precautions Precautions: Fall;Sternal Precaution Comments: Reviewed sternal precautions especially with sitting. Required Braces or Orthoses: No Restrictions Weight Bearing Restrictions: No Other Position/Activity Restrictions: due to sternal precautions Mobility (including Balance) Bed Mobility Rolling Left: Not tested (comment) Left Sidelying to Sit: Not tested (comment) Sitting - Scoot to Edge of Bed: Not tested (comment) Transfers Sit to Stand: Not tested (comment) Stand to Sit: 4: Min assist;To chair/3-in-1;With upper extremity assist Stand to Sit Details: Pt. used right UE to control descent even though cued to not use UEs. Ambulation/Gait Ambulation/Gait Assistance: 5: Supervision Ambulation/Gait Assistance Details (indicate cue type and reason): Cues to stay close to RW and slow gait speed.   Ambulation Distance (Feet): 180 Feet Assistive device: Rolling walker Gait Pattern: Step-through pattern;Decreased stride length;Trunk flexed Gait velocity: Slow cadence Stairs: No Wheelchair Mobility Wheelchair Mobility: No  Posture/Postural Control Posture/Postural Control: No  significant limitations Balance Balance Assessed: No Exercise    End of Session PT - End of Session Equipment Utilized During Treatment: Gait belt Activity Tolerance: Patient tolerated treatment well Patient left: in chair;with call bell in reach;with family/visitor present Nurse Communication: Mobility status for ambulation General Behavior During Session: [facility] for tasks performed Cognition: [facility] for tasks performed  INGOLD,Hertha Gergen 01/12/2011, 2:29 PM  [facility] [phone] (pager)

## 2011-01-12 NOTE — Progress Notes (Signed)
CARDIAC REHAB PHASE I   PRE:  Rate/Rhythm: sr 71  BP:  Supine  Sitting: 102/60  Standing:    SaO2: 93  ra  MODE:  Ambulation: 250  ft   POST:  Rate/Rhythem: 80  BP:  Supine:   Sitting: 104/66  Standing:    SaO2: 93  ra  Ronald [name]  1352 1420 -   Pt assisted from chair to ambulate in hallway with assist of one and rolling walker.  While ambulating pt remarked that he could not see out of his right eye.  Pt reported that his vision was blurry and began to rub his eye.  Pt assisted immediately back to his room. Pt denies any weakness in any extremities and follows commands appropriately, tongue midline and facial expression symmetrical.   Primary nurse, Laney Pastor notified of the new symptoms.  Pt sitting on the side of bed with continued complaint of blurry vision and pt could not see anything on peripherally.  Pt assisted to lay down in bed.  Pt reported that his vision was resolving and he could now see clearly and could see peripherally pt denies any further complaints. Daughter at bedside.

## 2011-01-13 LAB — GLUCOSE, CAPILLARY
Glucose-Capillary: 122 mg/dL — ABNORMAL HIGH (ref 70–99)
Glucose-Capillary: 189 mg/dL — ABNORMAL HIGH (ref 70–99)
Glucose-Capillary: 160 mg/dL — ABNORMAL HIGH (ref 70–99)
Glucose-Capillary: 158 mg/dL — ABNORMAL HIGH (ref 70–99)

## 2011-01-13 MED ORDER — DIPHENHYDRAMINE HCL 25 MG PO CAPS
25.0000 mg | ORAL_CAPSULE | Freq: Every evening | ORAL | Status: DC | PRN
  Administered 2011-01-13 – 2011-01-15 (×3): 25 mg via ORAL
  Filled 2011-01-13 (×3): qty 1

## 2011-01-13 MED ORDER — INSULIN GLARGINE 100 UNIT/ML ~~LOC~~ SOLN
18.0000 [IU] | Freq: Every day | SUBCUTANEOUS | Status: DC
  Administered 2011-01-13 – 2011-01-16 (×4): 18 [IU] via SUBCUTANEOUS

## 2011-01-13 NOTE — Progress Notes (Addendum)
6 Days Post-Op Procedure(s) (LRB): AORTIC VALVE REPLACEMENT (AVR) (N/A)  Subjective: Decreased cough today.  Breathing stable.  Has been off O2 some, but currently back on 2L.  Objective: Vital signs in last 24 hours: Patient Vitals for the past 24 hrs:  BP Temp Temp src Pulse Resp SpO2 Weight  01/13/11 0950 125/78 mmHg - - 118  - - -  01/13/11 0900 - - - - - 94 % -  01/13/11 0558 146/75 mmHg 98.5 F (36.9 C) Oral 96  20  93 % 195 lb 9.6 oz (88.724 kg)  01/13/11 0238 - - - - - 94 % -  01/12/11 2201 122/63 mmHg 98.9 F (37.2 C) Oral 97  18  92 % -  01/12/11 2101 - - - - - 94 % -  01/12/11 1527 - - - - - 92 % -  01/12/11 1404 110/60 mmHg 97.7 F (36.5 C) - 81  19  93 % -  01/12/11 1220 - - - - - 94 % -   Current Weight  01/13/11 195 lb 9.6 oz (88.724 kg)    Hemodynamic parameters for last 24 hours:    Intake/Output from previous day: 11/17 0701 - 11/18 0700 In: 480 [P.O.:480] Out: 2001 [Urine:2000; Stool:1] Intake/Output this shift:    PHYSICAL EXAM:  Heart: RRR Lungs: decreased BS, no rales or rhonchi Wound:  Scant amount serous drainage lower sternum, sternum stable Extremities: mild edema, improved  Lab Results: CBC: Basename 01/12/11 0650  WBC 11.9*  HGB 11.1*  HCT 32.3*  PLT 116*   BMET:  Basename 01/12/11 0650  NA 139  K 4.3  CL 104  CO2 23  GLUCOSE 169*  BUN 24*  CREATININE 0.91  CALCIUM 9.2    Sputum culture pending  Assessment/Plan: S/P Procedure(s) (LRB): AORTIC VALVE REPLACEMENT (AVR) (N/A)  Pulm- on empiric Avelox, D#3 for presumed bronchitis.  Continue pulm toilet.  F/U sputum cx.  Wean O2 DM- per Dr. Timothy Lasso.  Home on Lantus, po Rx Thrombocyopenia- plts stable Sternal drainage- less today.  Continue to monitor    LOS: 6 days    COLLINS,GINA H 01/13/2011   I have seen and examined the patient and agree with the assessment and plan as outlined.  OWEN,CLARENCE H

## 2011-01-13 NOTE — Progress Notes (Signed)
Subjective:  No CP or SOB. Cough has improved. Wound per CVTS. Insomnia last night. CBGs some worse.  Past History  Past Medical History (reviewed - no changes required): DM2 with Peripheral Neuropathy, Microalbuminuria/Vasular Complications, CAD, Severe PAD/Chronic leg Pain/Claudication with PCI/stenting to B SFA 2006 Current ABIs @ .5, RAS S/P PCI, Hyperlipidemia, Severe AS in Pt with H/O Rheumatic Heart Disease (Rheumatic Fever 1946 S/P 3 mon in hospital), H/O Renal Cyst, Divertisculosis. CRI/CKD Cr 1.2-1.5 Cr Cl 62. COPD/Tobacco abuse, thrombocytopenia. OA/Shoulder Pain. BPH, H/O Bladder Carcinoma - high grade urothelial carcinoma (high grade papillary urothelial carcinoma vs carcinoma in situ) S/P Operation and BCG, ED, Depression and Grief after his wife died  T12 comp F(x) due to trauma 1995  Peripheral Edema/Venous stasis doing well with stockings  Skin Cancer removed L Hand 03/2010 Dr Terri Piedra.  Surgical History (reviewed - no changes required): 03/24/02 PVD/PAD  R common, Right Ex illiac, PCA & Stents  L common, L Ex illiac  RAS  T &A  Nasal Polypectomy  B Cataract  Bladder Cancer S/P 06/13/08 Cystoscopy with transurethral resection of bladder lesion and random bladder biopsies. Local Excision BCG Rx 5/26   Objective: Vital signs in last 24 hours: Temp:  [97.7 F (36.5 C)-98.9 F (37.2 C)] 98.5 F (36.9 C) (11/18 0558) Pulse Rate:  [81-97] 96  (11/18 0558) Resp:  [18-20] 20  (11/18 0558) BP: (110-146)/(60-75) 146/75 mmHg (11/18 0558) SpO2:  [92 %-94 %] 94 % (11/18 0900) FiO2 (%):  [28 %] 28 % (11/18 0900) Weight:  [88.724 kg (195 lb 9.6 oz)] 195 lb 9.6 oz (88.724 kg) (11/18 0558) Weight change: 0.124 kg (4.4 oz) Last BM Date: 01/12/11  Intake/Output from previous day: 11/17 0701 - 11/18 0700 In: 480 [P.O.:480] Out: 2001 [Urine:2000; Stool:1] Intake/Output this shift:    PE) General appearance: alert and oriented.  Needs a shave.  Looks tired. Neurologic: intact    Heart: regular rate and rhythm, S1, S2 normal.  Tele NSR,  Lungs: diminished breath sounds bibasilar.  Less Cough Abdomen: soft, non-tender; bowel sounds normal; no masses, no organomegaly  Extremities: extremities normal but some edema. Wound: Patient's wound is per CVTS.  Lab Results:  [facility] 01/12/11 0650  WBC 11.9*  HGB 11.1*  HCT 32.3*  PLT 116*   BMET  Basename 01/12/11 0650  NA 139  K 4.3  CL 104  CO2 23  GLUCOSE 169*  BUN 24*  CREATININE 0.91  CALCIUM 9.2    Studies/Results: Dg Chest 2 View  01/12/2011  *RADIOLOGY REPORT*  Clinical Data: Shortness of breath.  Status post CABG.  CHEST - 2 VIEW  Comparison: PA and lateral chest 01/10/2011.  Findings: Small bilateral pleural effusions and basilar atelectasis, greater on the left persist.  There is cardiomegaly but no pulmonary edema.  No pneumothorax identified.  IMPRESSION: No marked change in small bilateral pleural effusions and basilar atelectasis, greater on the left.  Original Report Authenticated By: Bernadene Bell. Maricela Curet, [name].D.     Assessment/Plan: Principal Problem:  *S/P aortic valve replacement: #23 Magna Ease Edwards Pericardial Valve   Active Problems:  DIABETES MELLITUS, TYPE II  HYPERLIPIDEMIA  CAD  AS (aortic stenosis)  History of atrial fibrillation  Thrombocytopenia  Leukocytosis  Problem # 1: AORTIC STENOSIS (ICD-424.1)  POD #2 AORTIC VALVE REPLACEMENT (AVR) with pericardial tissue valve and placement of the atrial clip due to severe AS in Pt with H/O Rheumatic Heart Disease (Rheumatic Fever 1946 S/P 3 mon in hospital),   Has been  seen by PT and the  Recommend is 24 hour supervision/assist initially and HHPT/OT at D/C.   Doing well on Post op day 6. Home soon?  Watch sternal drainage. Painted incision with Betadine and cover.  Diuresed a little more yesterday and has a dose lasix on order.    Problem # 2: RENAL DISEASE, CHRONIC, MILD (ICD-585.2)  CRI/CKD Cr 1.02-1.5 Cr Cl 62.    Current Cr = .91 and excellent.    Problem # 3: DIABETES MELLITUS, TYPE II, CONTROLLED, W/VASCULAR COMPS (ICD-250.70)  CBG (last 3)   Basename 01/13/11 0558 01/12/11 2202 01/12/11 1846  GLUCAP 122* 243* 204*    On Orals and Lantus and ISS.  CBGs up and down. OK to D/C on Lantus - increase dose  #4.  Elevated WBC/ Agree with Continuing Avelox D2 for presumed bronchitis. WBC down .  Cont  On mucolytics, inhalers, IS, FV.   tessalon.   #5. Thrombocytopenia - plts improving. Consider restarting Plavix at D/C    OutPt Complete Medication List:  1) Benazepril-hydrochlorothiazide 20-12.5 Mg Tabs (Benazepril-hydrochlorothiazide) .... 2 po qd  2) Flonase 50 Mcg/act Susp (Fluticasone propionate) .Marland Kitchen.. 1 spary in each nostril bid  3) Glucovance 5-500 Mg Tabs (Glyburide-metformin) .Marland Kitchen.. 1 tab by mouth in am and 1/2 in evening  4) Klor-con M20 20 Meq Cr-tabs (Potassium chloride crys cr) .Marland Kitchen.. 1 tab daily  5) Plavix 75 Mg Tabs (Clopidogrel bisulfate) .Marland Kitchen.. 1 tab daily  6) Proscar 5 Mg Tabs (Finasteride) .Marland Kitchen.. 1 tab daily  7) Toprol Xl 100 Mg Xr24h-tab (Metoprolol succinate) .Marland Kitchen.. 1 tab daily  8) Zocor 20 Mg Tabs (Simvastatin) .Marland Kitchen.. 1 tab daily  9) Norvasc 10 Mg Tabs (Amlodipine besylate) .Marland Kitchen.. 1 tablet by mouth daily  10) Doxazosin Mesylate 4 Mg Tabs (Doxazosin mesylate) .Marland Kitchen.. 1 tablet by mouth daily  11) Lasix 20 Mg Tabs (Furosemide) .Marland Kitchen.. 1 tablet by mouith daily  12) Celebrex 200 Mg Caps (Celecoxib) .Marland Kitchen.. 1 tablet by mouth as needed.  13) Onetouch Ultra Test Strp (Glucose blood) .... Self monitor blood glucose twice daily and as needed (dx 250.02)  14) Freestyle Test Strp (Glucose blood) .... Use 1 strip tid       LOS: 6 days   Ronald [name] 01/13/2011, 9:45 AM

## 2011-01-14 ENCOUNTER — Inpatient Hospital Stay (HOSPITAL_COMMUNITY): Payer: Medicare Other

## 2011-01-14 LAB — CBC
MCV: 92.6 fL (ref 78.0–100.0)
Platelets: 146 10*3/uL — ABNORMAL LOW (ref 150–400)
RBC: 3.26 MIL/uL — ABNORMAL LOW (ref 4.22–5.81)
WBC: 10.6 10*3/uL — ABNORMAL HIGH (ref 4.0–10.5)

## 2011-01-14 LAB — GLUCOSE, CAPILLARY
Glucose-Capillary: 99 mg/dL (ref 70–99)
Glucose-Capillary: 176 mg/dL — ABNORMAL HIGH (ref 70–99)
Glucose-Capillary: 186 mg/dL — ABNORMAL HIGH (ref 70–99)
Glucose-Capillary: 150 mg/dL — ABNORMAL HIGH (ref 70–99)

## 2011-01-14 LAB — BASIC METABOLIC PANEL
CO2: 26 mEq/L (ref 19–32)
Chloride: 103 mEq/L (ref 96–112)
Creatinine, Ser: 1.03 mg/dL (ref 0.50–1.35)

## 2011-01-14 LAB — CULTURE, RESPIRATORY W GRAM STAIN: Culture: NORMAL

## 2011-01-14 NOTE — Progress Notes (Signed)
Cardiac Rehab 1440 Pt has walked twice today. The last walk with PT at about 1400. We will follow up tomorrow. Huda Petrey DunlapRN

## 2011-01-14 NOTE — Progress Notes (Addendum)
7 Days Post-Op Procedure(s) (LRB): AORTIC VALVE REPLACEMENT (AVR) (N/A)  Subjective: Cough decreased.  Feeling a little better today.  Still sore in chest.   Objective: Vital signs in last 24 hours: Patient Vitals for the past 24 hrs:  BP Temp Temp src Pulse Resp SpO2 Weight  01/14/11 0842 - - - - - 95 % -  01/14/11 0500 150/67 mmHg 97.9 F (36.6 C) Oral 100  20  95 % 192 lb 11.2 oz (87.408 kg)  01/13/11 2110 128/71 mmHg 98.2 F (36.8 C) Oral 96  20  95 % -  01/13/11 1457 - - - - - 96 % -  01/13/11 1422 127/70 mmHg 97.8 F (36.6 C) - 88  18  95 % -  01/13/11 0950 125/78 mmHg - - 118  - - -   Current Weight  01/14/11 192 lb 11.2 oz (87.408 kg)        Intake/Output from previous day: 11/18 0701 - 11/19 0700 In: 480 [P.O.:480] Out: 1050 [Urine:1050]     PHYSICAL EXAM:  Heart: RRR Lungs: few coarse BS, much clearer overall Wound: Small amount serosanguinous drainage from lower sternum, improved.  No erythema Extremities: Mild LE edema  Lab Results: CBC: Basename 01/14/11 0550 01/12/11 0650  WBC 10.6* 11.9*  HGB 10.4* 11.1*  HCT 30.2* 32.3*  PLT 146* 116*   BMET:  Basename 01/14/11 0550 01/12/11 0650  NA 140 139  K 3.7 4.3  CL 103 104  CO2 26 23  GLUCOSE 103* 169*  BUN 26* 24*  CREATININE 1.03 0.91  CALCIUM 9.1 9.2      Assessment/Plan: S/P Procedure(s) (LRB): AORTIC VALVE REPLACEMENT (AVR) (N/A)  Pulm- improving.  WBC down, no fever.  Cough better. D#4 Avelox.  Continue pulm toilet.  May need home O2. DM- sugars better.  Per Dr. Timothy Lasso. Sternal drainage- better.  Continue local wound care. Hopefully home soon if continues to improve.    LOS: 7 days    COLLINS,GINA H 01/14/2011  Patient seen and examined, labs reviewed. He continues to improve. Trying to wean off o2. Home one or two days. Holding sinus with pacs. [name] 01/14/2011 4:09 PM

## 2011-01-14 NOTE — Progress Notes (Signed)
Physical Therapy Treatment Patient Details Name: Ronald [name] MRN: [id number] DOB: 16-Jul-1922 Today's Date: 01/14/2011  PT Assessment/Plan  PT - Assessment/Plan Comments on Treatment Session: Pt with decreased activity tolerance today.  Pt walked this morning with nursing and then with PT this afternoon but only abel to go 50 feet due to 4/4 dyspnea.  pts O2 sats stayed above 90% on room air.  Nursing notiffied.  family present PT Plan: Discharge plan remains appropriate (monitor pts progress and if not progressing my need change) PT Frequency: Min 3X/week Follow Up Recommendations: 24 hour supervision/assistance;Home health PT Equipment Recommended: Rolling walker with 5" wheels (may need RW at DC if not progressing as anticipated) PT Goals  Acute Rehab PT Goals PT Transfer Goal: Sit to Stand/Stand to Sit - Progress: Not met PT Goal: Ambulate - Progress: Not met (pt only walked 1/3 of distance from last visit) PT Goal: Up/Down Stairs - Progress: Not met (Pt unable to attempt steps today due to fatique)  PT Treatment Precautions/Restrictions  Precautions Precautions: Sternal;Fall Precaution Comments: reviewed sternal precautions with pt.  encouraged him not to get up without assist Required Braces or Orthoses: No Restrictions Weight Bearing Restrictions: No Other Position/Activity Restrictions: due to sternal precautions Mobility (including Balance) Transfers Sit to Stand: From bed;4: Min assist Sit to Stand Details (indicate cue type and reason): cues to minimize use of UEs Stand to Sit: 5: Supervision Stand to Sit Details: cues to keep wwalker with him when backing up to sit down. Ambulation/Gait Ambulation/Gait: Yes Ambulation/Gait Assistance: 4: Min assist Ambulation/Gait Assistance Details (indicate cue type and reason): Pt cued for going slowly and doing steady breathing techniques.  Pt with 4/4 dyspnea after 25 feet and asked to return to his room.  Pt cued to take  standing rest breaks as needed Ambulation Distance (Feet): 50 Feet Assistive device: Rolling walker Gait Pattern: Trunk flexed;Step-to pattern;Decreased stride length  Posture/Postural Control Posture/Postural Control: No significant limitations   End of Session PT - End of Session Equipment Utilized During Treatment: Gait belt Activity Tolerance: Patient limited by fatigue Patient left: in bed;with call bell in reach;with family/visitor present Nurse Communication:  (decreased activity tolerance) General Behavior During Session: [facility] for tasks performed Cognition: [facility] for tasks performed  01/14/2011   Ranae Palms, PT   Judson Roch 01/14/2011, 2:52 PM

## 2011-01-15 LAB — GLUCOSE, CAPILLARY
Glucose-Capillary: 161 mg/dL — ABNORMAL HIGH (ref 70–99)
Glucose-Capillary: 151 mg/dL — ABNORMAL HIGH (ref 70–99)
Glucose-Capillary: 207 mg/dL — ABNORMAL HIGH (ref 70–99)
Glucose-Capillary: 171 mg/dL — ABNORMAL HIGH (ref 70–99)

## 2011-01-15 MED ORDER — PREDNISONE 20 MG PO TABS
20.0000 mg | ORAL_TABLET | Freq: Every day | ORAL | Status: AC
  Administered 2011-01-15 – 2011-01-17 (×3): 20 mg via ORAL
  Filled 2011-01-15 (×4): qty 1

## 2011-01-15 NOTE — Progress Notes (Signed)
TCTS DAILY PROGRESS NOTE  8 Days Post-Op Procedure(s) (LRB): AORTIC VALVE REPLACEMENT (AVR) (N/A)  Subjective: Ambulating better, still on o2 but more active and less sob  Objective: Vital signs in last 24 hours: Patient Vitals for the past 24 hrs:  BP Temp Temp src Pulse Resp SpO2 Weight  01/15/11 1443 104/61 mmHg 98.7 F (37.1 C) Oral 72  18  94 % -  01/15/11 0853 - - - - - 96 % -  01/15/11 0621 132/72 mmHg 97.8 F (36.6 C) Oral 79  20  95 % 192 lb 3.2 oz (87.181 kg)  01/15/11 0500 - - - - - - 192 lb 0.3 oz (87.1 kg)  01/14/11 2150 120/69 mmHg - - 96  - - -  01/14/11 2123 116/70 mmHg 99.3 F (37.4 C) Oral 90  20  96 % -     Hemodynamic parameters for last 24 hours:    Intake/Output from previous day: 11/19 0701 - 11/20 0700 In: 603 [P.O.:600; I.V.:3] Out: 1050 [Urine:1050] Intake/Output this shift:   Current Meds: Current facility-administered medications:acetaminophen (TYLENOL) tablet 650 mg, 650 mg, Oral, Q6H PRN, Kathlee Nations Trigt III, MD, 650 mg at 01/14/11 2033;  albuterol (PROVENTIL HFA;VENTOLIN HFA) 108 (90 BASE) MCG/ACT inhaler 2 puff, 2 puff, Inhalation, Q6H, Delight Ovens, MD, 2 puff at 01/15/11 1451;  alum & mag hydroxide-simeth (MAALOX PLUS) 400-400-40 MG/5ML suspension 15-30 mL, 15-30 mL, Oral, Q4H PRN, Kathlee Nations Trigt III, MD benzonatate (TESSALON) capsule 200 mg, 200 mg, Oral, TID PRN, [name], MD;  bisacodyl (DULCOLAX) EC tablet 10 mg, 10 mg, Oral, Daily PRN, Kathlee Nations Trigt III, MD;  bisacodyl (DULCOLAX) suppository 10 mg, 10 mg, Rectal, Daily PRN, Kathlee Nations Trigt III, MD;  diphenhydrAMINE (BENADRYL) capsule 25 mg, 25 mg, Oral, QHS PRN, Purcell Nails, MD, 25 mg at 01/14/11 2153 docusate sodium (COLACE) capsule 200 mg, 200 mg, Oral, Daily, Kathlee Nations Trigt III, MD, 200 mg at 01/15/11 1610;  finasteride (PROSCAR) tablet 5 mg, 5 mg, Oral, Daily, Delight Ovens, MD, 5 mg at 01/15/11 0954;  Fluticasone-Salmeterol (ADVAIR) 250-50 MCG/DOSE inhaler 1 puff, 1  puff, Inhalation, BID, Adella Hare, PA, 1 puff at 01/15/11 0853;  furosemide (LASIX) tablet 40 mg, 40 mg, Oral, Daily, Delight Ovens, MD, 40 mg at 01/15/11 0955 glyBURIDE (DIABETA) tablet 5 mg, 5 mg, Oral, BID WC, [name], MD, 5 mg at 01/15/11 1657;  guaiFENesin (MUCINEX) 12 hr tablet 600 mg, 600 mg, Oral, BID, Adella Hare, PA, 600 mg at 01/15/11 0955;  insulin aspart (novoLOG) injection 0-24 Units, 0-24 Units, Subcutaneous, TID AC & HS, Kathlee Nations Trigt III, MD, 8 Units at 01/15/11 1657 insulin glargine (LANTUS) injection 18 Units, 18 Units, Subcutaneous, QHS, [name], MD, 18 Units at 01/14/11 2151;  magnesium hydroxide (MILK OF MAGNESIA) suspension 30 mL, 30 mL, Oral, Daily PRN, Kathlee Nations Trigt III, MD;  metFORMIN (GLUCOPHAGE) tablet 500 mg, 500 mg, Oral, BID WC, [name], MD, 500 mg at 01/15/11 1657;  metoprolol (LOPRESSOR) tablet 50 mg, 50 mg, Oral, BID, Dwana Melena, PA, 50 mg at 01/15/11 0956 moxifloxacin (AVELOX) tablet 400 mg, 400 mg, Oral, Daily, Adella Hare, PA, 400 mg at 01/15/11 0957;  ondansetron (ZOFRAN) injection 4 mg, 4 mg, Intravenous, Q6H PRN, Kathlee Nations Trigt III, MD;  ondansetron [facility]) tablet 4 mg, 4 mg, Oral, Q6H PRN, Kathlee Nations Trigt III, MD;  pantoprazole (PROTONIX) EC tablet 40 mg, 40 mg, Oral, QAC  breakfast, Kathlee Nations Trigt III, MD, 40 mg at 01/15/11 0800 potassium chloride SA (K-DUR,KLOR-CON) CR tablet 20 mEq, 20 mEq, Oral, Daily, Kathlee Nations Trigt III, MD, 20 mEq at 01/15/11 0958;  povidone-iodine (BETADINE) 10 % external solution 1 application, 1 application, Topical, BID, Kathlee Nations Trigt III, MD, 1 application at 01/15/11 [phone];  predniSONE (DELTASONE) tablet 20 mg, 20 mg, Oral, QAC breakfast, [name], MD, 20 mg at 01/15/11 3086 simvastatin (ZOCOR) tablet 20 mg, 20 mg, Oral, QHS, Delight Ovens, MD, 20 mg at 01/14/11 2149;  sodium chloride 0.9 % injection 3 mL, 3 mL, Intravenous, Q12H, Kathlee Nations Trigt III, MD, 3 mL at 01/15/11 1000;  traMADol  (ULTRAM) tablet 50 mg, 50 mg, Oral, Q6H PRN, Kathlee Nations Trigt III, MD;  traMADol Janean Sark) tablet 50-100 mg, 50-100 mg, Oral, Q4H PRN, Kathlee Nations Trigt III, MD, 100 mg at 01/10/11 2023  General appearance: alert and cooperative Neurologic: intact Heart: regular rate and rhythm, S1, S2 normal, no murmur, click, rub or gallop Lungs: wheezes bibasilar Extremities: extremities normal, atraumatic, no cyanosis or edema, Homans sign is negative, no sign of DVT, no edema, redness or tenderness in the calves or thighs and venous stasis dermatitis noted Wound: stable sternum no drainage now  Lab Results: CBC: Basename 01/14/11 0550  WBC 10.6*  HGB 10.4*  HCT 30.2*  PLT 146*   BMET:  Basename 01/14/11 0550  NA 140  K 3.7  CL 103  CO2 26  GLUCOSE 103*  BUN 26*  CREATININE 1.03  CALCIUM 9.1    PT/INR: No results found for this basename: LABPROT,INR in the last 72 hours ABG:  INR: Will add last result for INR, ABG once components are confirmed Will add last 4 CBG results once components are confirmed  Assessment/Plan: Appears better today, ambulating better S/P Procedure(s) (LRB): AORTIC VALVE REPLACEMENT (AVR) (N/A) Mobilize Diabetes control   LOS: 8 days    [name] 01/15/2011 7:30 PM

## 2011-01-15 NOTE — Progress Notes (Signed)
Subjective:  CBGs have been fine for the last few days.  Weak difficult continuous cough and Hypoxia. SOB/DOE worsening. No CP Some Pharyngeal issues. Some upper airway congestion. Less Edema. Eating OK.  Past History  Past Medical History (reviewed - no changes required): DM2 with Peripheral Neuropathy, Microalbuminuria/Vasular Complications, CAD, Severe PAD/Chronic leg Pain/Claudication with PCI/stenting to B SFA 2006 Current ABIs @ .5, RAS S/P PCI, Hyperlipidemia, Severe AS in Pt with H/O Rheumatic Heart Disease (Rheumatic Fever 1946 S/P 3 mon in hospital), H/O Renal Cyst, Divertisculosis. CRI/CKD Cr 1.2-1.5 Cr Cl 62. COPD/Tobacco abuse, thrombocytopenia. OA/Shoulder Pain. BPH, H/O Bladder Carcinoma - high grade urothelial carcinoma (high grade papillary urothelial carcinoma vs carcinoma in situ) S/P Operation and BCG, ED, Depression and Grief after his wife died  T12 comp F(x) due to trauma 1995  Peripheral Edema/Venous stasis doing well with stockings  Skin Cancer removed L Hand 03/2010 Dr Terri Piedra.  Surgical History (reviewed - no changes required): 03/24/02 PVD/PAD  R common, Right Ex illiac, PCA & Stents  L common, L Ex illiac  RAS  T &A  Nasal Polypectomy  B Cataract  Bladder Cancer S/P 06/13/08 Cystoscopy with transurethral resection of bladder lesion and random bladder biopsies. Local Excision BCG Rx 5/26   Objective: Vital signs in last 24 hours: Temp:  [97.6 F (36.4 C)-99.3 F (37.4 C)] 97.8 F (36.6 C) (11/20 0621) Pulse Rate:  [79-96] 79  (11/20 0621) Resp:  [20] 20  (11/20 0621) BP: (102-132)/(59-72) 132/72 mmHg (11/20 0621) SpO2:  [90 %-96 %] 95 % (11/20 0621) FiO2 (%):  [32 %] 32 % (11/19 2029) Weight:  [87.1 kg (192 lb 0.3 oz)-87.181 kg (192 lb 3.2 oz)] 192 lb 3.2 oz (87.181 kg) (11/20 1610) Weight change: -0.308 kg (-10.9 oz) Last BM Date: 01/13/11  Intake/Output from previous day: 11/19 0701 - 11/20 0700 In: 603 [P.O.:600; I.V.:3] Out: 1050  [Urine:1050] Intake/Output this shift:    PE) General appearance: alert and oriented.    Looks tired and rough.  No distress Wearing FIO2. Neurologic: intact  Heart: regular rate and rhythm, S1, S2 normal.  Tele NSR.  Sternotomy  Lungs: diminished breath sounds bibasilar.   Cough. Min rhonchi. Abdomen: soft, non-tender; bowel sounds normal; no masses, no organomegaly  Extremities: extremities normal but some edema. Wound: Patient's wound is per CVTS.  Lab Results:  [facility] 01/14/11 0550  WBC 10.6*  HGB 10.4*  HCT 30.2*  PLT 146*   BMET  Basename 01/14/11 0550  NA 140  K 3.7  CL 103  CO2 26  GLUCOSE 103*  BUN 26*  CREATININE 1.03  CALCIUM 9.1    Studies/Results: Dg Chest 2 View  01/14/2011  *RADIOLOGY REPORT*  Clinical Data: CABG.  Short of breath.  CHEST - 2 VIEW  Comparison: 01/12/2011.  Findings: Small to moderate bilateral pleural effusions persist. Aortic valve replacement is present with bioprosthetic aortic valve.  Median sternotomy.  Atrial clamp is present.  Bilateral basilar atelectasis.  No airspace disease or edema. Compared to the prior exam, the pleural effusions appear little changed.  IMPRESSION:  1. No interval change.  Small to moderate bilateral pleural effusions with associated atelectasis. 2.  Postsurgical changes of aortic valve replacement and atrial clamp.  Original Report Authenticated By: Andreas Newport, M.D.     Assessment/Plan: Principal Problem:  *S/P aortic valve replacement: #23 Magna Ease Edwards Pericardial Valve   Active Problems:  DIABETES MELLITUS, TYPE II  HYPERLIPIDEMIA  CAD  AS (aortic stenosis)  History of  atrial fibrillation  Thrombocytopenia  Leukocytosis  Problem # 1: AORTIC STENOSIS (ICD-424.1)  S/P  AORTIC VALVE REPLACEMENT (AVR) with pericardial tissue valve and placement of the atrial clip due to severe AS in Pt with H/O Rheumatic Heart Disease (Rheumatic Fever 1946 S/P 3 months in hospital),   Has been seen by PT  and the  Recommend is 24 hour supervision/assist initially and HHPT/OT at D/C.   Diuresing with Lasix and less Edema and less weight. Sternal drainage per CVTS.    Problem # 2: RENAL DISEASE, CHRONIC, MILD (ICD-585.2) - Cr stable and excellent.   Problem # 3: DIABETES MELLITUS, TYPE II, CONTROLLED, W/VASCULAR COMPS (ICD-250.70)  CBG (last 3)   Basename 01/15/11 0550 01/14/11 2124 01/14/11 1636  GLUCAP 161* 150* 186*    On Orals and Lantus and ISS.  CBGs staying stableOK to D/C on Lantus - increase dose  #4. Bronchitis/Hypoxia/cough/URI - No PNA on CXR - WBC better.  Finish empiric Avelox, D#67for presumed bronchitis. Continue pulm toilet. F/U sputum cx. Wean O2  Cont  On mucolytics, inhalers, IS, FV.   Tessalon. Add flutter valve May need Home O2 Sxs are not C/W PE.  He has a weak but recurrent cough Even though PFTs prior to surgery were better than expected he has smoked forever and Steroids may be helpful (except on CBGs) - will give pred 20 q day for 3 to see if helps respiratory status.   #5. Thrombocytopenia - plts improved. Consider restarting Plavix at D/C      LOS: 8 days   Zanayah Shadowens M 01/15/2011, 7:44 AM

## 2011-01-15 NOTE — Progress Notes (Addendum)
8 Days Post-Op Procedure(s) (LRB): AORTIC VALVE REPLACEMENT (AVR) (N/A)  Subjective: Patient still with cough (no worse than before) and chest soreness on right. Does have increasing shortness of breath, mostly with exertion.  On 2L O2 (O2 sat 94% THIS AM).  Objective: Vital signs in last 24 hours: Patient Vitals for the past 24 hrs:  BP Temp Temp src Pulse Resp SpO2 Weight  01/15/11 0621 132/72 mmHg 97.8 F (36.6 C) Oral 79  20  95 % 192 lb 3.2 oz (87.181 kg)  01/15/11 0500 - - - - - - 192 lb 0.3 oz (87.1 kg)  01/14/11 2150 120/69 mmHg - - 96  - - -  01/14/11 2123 116/70 mmHg 99.3 F (37.4 C) Oral 90  20  96 % -  01/14/11 1440 - - - 84  - 90 % -  01/14/11 1423 102/59 mmHg 97.6 F (36.4 C) Oral 81  20  96 % -  01/14/11 1356 - - - - - 94 % -  01/14/11 0842 - - - - - 95 % -   Pre op weight  82 kg Current Weight  01/15/11 192 lb 3.2 oz (87.181 kg)       Intake/Output from previous day: 11/19 0701 - 11/20 0700 In: 603 [P.O.:600; I.V.:3] Out: 1050 [Urine:1050]   Physical Exam:  Cardiovascular: RRR, no murmurs, gallops, or rubs. Pulmonary: Clear to auscultation bilaterally; no rales, wheezes, or rhonchi. Abdomen: Soft, non tender, bowel sounds present. Extremities: Mild bilateral lower extremity edema. Wound: Small amount of serosanguinous drainage lower sternal wound.  No erythema or signs of infection.  Lab Results: CBC: Basename 01/14/11 0550  WBC 10.6*  HGB 10.4*  HCT 30.2*  PLT 146*   BMET:  Basename 01/14/11 0550  NA 140  K 3.7  CL 103  CO2 26  GLUCOSE 103*  BUN 26*  CREATININE 1.03  CALCIUM 9.1    PT/INR: No results found for this basename: LABPROT,INR in the last 72 hours ABG:  INR: Will add last result for INR, ABG once components are confirmed Will add last 4 CBG results once components are confirmed  Assessment/Plan:  1. CV - SR. Continue current medications. 2.  Pulmonary - On 2 L O2 via [place] and will need at home.  On Avelox (day #5) for  bronchitis.  Prednisone 20 mg also started by internal medicine today. 3. Volume Overload - Continue with diuresis. 4.  Acute blood loss anemia - H/H stable at 10.4/30.2. 5.DM-CBGs 186/150/161. Continue with current medications. 6. Continue local wound care to lower sternum. 7. Continue CRPI.   Ardelle Balls, PA 01/15/2011   patient examined and medical record reviewed,agree with above note.Pulmonary status marginal for D/C Ronald [name],Ronald [name] 01/15/2011

## 2011-01-15 NOTE — Progress Notes (Signed)
CARDIAC REHAB PHASE I   PRE:  Rate/Rhythm: 84 SR  BP:  Supine: 130/60  Sitting:   Standing:    SaO2: 96 2L  MODE:  Ambulation: 550 ft   POST:  Rate/Rhythem: 90  BP:  Supine:   Sitting: 132/70  Standing:    SaO2: 94 2L 1035-1115 Assisted X1 used walker and O2 2L to ambulate. Gait steady with walker. Some DOE and took several standing rest stops, but able to walk 550 ft. with encouragement. Strongly encouraged staying OOB in chair and using IS and flutter value every hour. Pt to chair after walk with call light in reach.  Ronald [name]

## 2011-01-16 ENCOUNTER — Inpatient Hospital Stay (HOSPITAL_COMMUNITY): Payer: Medicare Other

## 2011-01-16 LAB — GLUCOSE, CAPILLARY
Glucose-Capillary: 117 mg/dL — ABNORMAL HIGH (ref 70–99)
Glucose-Capillary: 180 mg/dL — ABNORMAL HIGH (ref 70–99)
Glucose-Capillary: 177 mg/dL — ABNORMAL HIGH (ref 70–99)
Glucose-Capillary: 192 mg/dL — ABNORMAL HIGH (ref 70–99)

## 2011-01-16 NOTE — Progress Notes (Addendum)
9 Days Post-Op  Procedure(s) (LRB): AORTIC VALVE REPLACEMENT (AVR) (N/A) Subjective: Getting out of bed to go to bathroom became dyspneic with increased wheezing. Fatigues quite easily  Objective  Telemetry sinus rhythm  Temp:  [97.4 F (36.3 C)-98.7 F (37.1 C)] 98.6 F (37 C) (11/21 0631) Pulse Rate:  [70-88] 70  (11/21 0631) Resp:  [15-18] 15  (11/21 0631) BP: (104-139)/(61-70) 139/70 mmHg (11/21 0631) SpO2:  [90 %-96 %] 90 % (11/21 0812)3 liters O2   Intake/Output Summary (Last 24 hours) at 01/16/11 0818 Last data filed at 01/16/11 0500  Gross per 24 hour  Intake    240 ml  Output   1475 ml  Net  -1235 ml       General appearance: alert, fatigued and mild distress Neurologic: intact Heart: regular rate and rhythm Lungs: exp wheeze, diminished in the bases Abdomen: soft, non-tender; bowel sounds normal; no masses,  no organomegaly Extremities: no edema Wound: incicions healing well without signs of infection.  Lab Results:  [facility] 01/14/11 0550  NA 140  K 3.7  CL 103  CO2 26  GLUCOSE 103*  BUN 26*  CREATININE 1.03  CALCIUM 9.1  MG --  PHOS --   No results found for this basename: AST:2,ALT:2,ALKPHOS:2,BILITOT:2,PROT:2,ALBUMIN:2 in the last 72 hours No results found for this basename: LIPASE:2,AMYLASE:2 in the last 72 hours  Basename 01/14/11 0550  WBC 10.6*  NEUTROABS --  HGB 10.4*  HCT 30.2*  MCV 92.6  PLT 146*   No results found for this basename: CKTOTAL:4,CKMB:4,TROPONINI:4 in the last 72 hours No results found for this basename: POCBNP:3 in the last 72 hours No results found for this basename: DDIMER in the last 72 hours No results found for this basename: HGBA1C in the last 72 hours No results found for this basename: CHOL,HDL,LDLCALC,TRIG,CHOLHDL in the last 72 hours No results found for this basename: TSH,T4TOTAL,FREET3,T3FREE,THYROIDAB in the last 72 hours No results found for this basename:  VITAMINB12,FOLATE,FERRITIN,TIBC,IRON,RETICCTPCT in the last 72 hours  Medications: Scheduled    . albuterol  2 puff Inhalation Q6H  . docusate sodium  200 mg Oral Daily  . finasteride  5 mg Oral Daily  . Fluticasone-Salmeterol  1 puff Inhalation BID  . furosemide  40 mg Oral Daily  . glyBURIDE  5 mg Oral BID WC  . guaiFENesin  600 mg Oral BID  . insulin aspart  0-24 Units Subcutaneous TID AC & HS  . insulin glargine  18 Units Subcutaneous QHS  . metFORMIN  500 mg Oral BID WC  . metoprolol tartrate  50 mg Oral BID  . moxifloxacin  400 mg Oral Daily  . pantoprazole  40 mg Oral QAC breakfast  . potassium chloride  20 mEq Oral Daily  . povidone-iodine  1 application Topical BID  . predniSONE  20 mg Oral QAC breakfast  . simvastatin  20 mg Oral QHS  . sodium chloride  3 mL Intravenous Q12H     Radiology/Studies:  No results found.  INR: Will add last result for INR, ABG once components are confirmed Will add last 4 CBG results once components are confirmed  Assessment/Plan: S/P Procedure(s) (LRB): AORTIC VALVE REPLACEMENT (AVR) (N/A) 1. Conts. With slow improvement . Conts to require O2 and aggressive pulm management. Conts avelox. 2. CBG with reasonable control, range 117-207 for 24 hours. On prednisone as well 3. Cont diuresis 4. Cont rehab/Physical therapy  LOS: 9 days    GOLD,WAYNE E 11/21/20128:18 AM   Chart reviewed and patient  examined.  Agree with above. He needs a lot of encouragement to mobilize but walked 574ft.

## 2011-01-16 NOTE — Progress Notes (Signed)
CARDIAC REHAB PHASE I   PRE:  Rate/Rhythm: 65 SR  BP:  Supine:   Sitting: 120/60  Standing:    SaO2:94 RA  MODE:  Ambulation: 550 ft   POST:  Rate/Rhythem: 76 SR  BP:  Supine:   Sitting: 120/70  Standing:    SaO2: 93-96 RA 1345-1420 Assisted X one and used walker to ambulate. Gait steady. At first pt c/o of being exhausted after just walking to BR and states I don't think I can do this. After encouragement and getting him to take deep breaths and doing standing rest stops he was able to walk 550 ft. Checked RA sats several times during walk 93- 96%. Reinforce D/C education with pt.  Beatrix Fetters

## 2011-01-16 NOTE — Progress Notes (Signed)
   CARE MANAGEMENT NOTE 01/16/2011  Patient:  Ronald [name], Ronald [name]   Account Number:  [id number]  Date Initiated:  01/10/2011  Documentation initiated by:  [facility]  Subjective/Objective Assessment:   Post op AVR     Action/Plan:   Anticipated DC Date:  01/17/2011   Anticipated DC Plan:  HOME W HOME HEALTH SERVICES      DC Planning Services  CM consult      [facility] Choice  HOME HEALTH   Choice offered to / List presented to:  C-1 Patient   DME arranged  [facility]      DME agency  [facility]     [facility] arranged  HH-1 RN  HH-2 PT      [facility] agency  [facility]   Status of service:  Completed, signed off Medicare Important Message given?   (If response is "NO", the following Medicare IM given date fields will be blank) Date Medicare IM given:   Date Additional Medicare IM given:    Discharge Disposition:  HOME W HOME HEALTH SERVICES  Per UR Regulation:  Reviewed for med. necessity/level of care/duration of stay  Comments:  01/16/11 Paloma Grange,RN,BSN 1100 PT REQUESTS [facility] FOR HOME HEALTH AND HOME O2 SET UP. REFERRAL TO [facility] WITH [facility].  START OF CARE 24-48H POST DC DATE.  01/15/11 Josi Roediger,RN,BSN 1400 MET WITH PT AND DAUGHTER TO DISCUSS DC PLANS; FAMILY TO PROVIDE CARE AT DC.  PT WILL NEED HOME HEALTH CARE AT DC, AS WELL.  WILL ARRANGE HHRN AND HHPT--[place] PROVIDER LIST GIVEN TO PT TO REVIEW, IN ADDITION TO PRIVATE DUTY SITTER LIST, PER PT/FAMILY REQUEST.  PT HAS 3 CHILDREN AND A BROTHER WHO ARE PLANNING TO ASSIST HIM AT HOME.  HE STATES HE HAS ALL NEEDED DME.  WILL FOLLOW UP IN AM WITH CHOICE FOR [facility] PROVIDER.  01-14-11 2:40pm Avie Arenas, RNBSN [phone] UR Completed.  01/11/11 Euphemia Lingerfelt,RN,BSN 1345 MET WITH PT AND BROTHER TO DISCUSS DC PLANS.  PTA, PT INDEPENDENT, LIVES ALONE.   PT'S BROTHER AND CHILDREN TO ASSIST AT DISCHARGE.  MAY NEED HHRN FOR RESTORATIVE CARE, DEPENDING ON PROGRESS.  WILL FOLLOW.  01-10-11 10:45am Avie Arenas,  RNBSN [phone] UR Completed.

## 2011-01-16 NOTE — Progress Notes (Signed)
Physical Therapy Treatment Patient Details Name: Ronald [name] MRN: [id number] DOB: 12-27-1922 Today's Date: 01/16/2011  PT Assessment/Plan  PT - Assessment/Plan Comments on Treatment Session: generally steady and appears to be improving with activity tolerance steadily. PT Plan: Discharge plan remains appropriate Follow Up Recommendations: Home health PT;24 hour supervision/assistance Equipment Recommended: Rolling walker with 5" wheels PT Goals  Acute Rehab PT Goals PT Goal Formulation: With patient Pt will Roll Supine to Left Side: Other (comment) (not addressed today) PT Goal: Rolling Supine to Left Side - Progress: Other (comment) (not addressed today) PT Transfer Goal: Sit to Stand/Stand to Sit - Progress: Progressing toward goal PT Goal: Ambulate - Progress: Progressing toward goal PT Goal: Up/Down Stairs - Progress: Other (comment) (not addressed today)  PT Treatment Precautions/Restrictions  Precautions Precautions: Fall;Sternal Precaution Comments: reviewed sternal precautions with pt.  encouraged him not to get up without assist Required Braces or Orthoses: No Restrictions Weight Bearing Restrictions: No Other Position/Activity Restrictions: due to sternal precautions Mobility (including Balance) Bed Mobility Bed Mobility: No Transfers Transfers: Yes Sit to Stand: 4: Min assist;From chair/3-in-1;Other (comment) (pt began to get up with UEAssist, vc's for hand placement) Sit to Stand Details (indicate cue type and reason): vc's for hand placement due to sternal precautions Stand to Sit: 5: Supervision Stand to Sit Details: vc's for hand placement Ambulation/Gait Ambulation/Gait Assistance: 5: Supervision Ambulation/Gait Assistance Details (indicate cue type and reason): vc's for postural checks, occaisional cues to improve safety with the RW, but generally stable and safe Ambulation Distance (Feet): 250 Feet Assistive device: Rolling walker Gait Pattern:  Step-through pattern;Within Functional Limits Gait velocity:  (was able to incr. gait speed significantly w/o incr. fatigue)  Posture/Postural Control Posture/Postural Control: No significant limitations Exercise    End of Session PT - End of Session Activity Tolerance: Patient limited by fatigue Patient left: in chair General Behavior During Session: Agitated  Willena Jeancharles, Eliseo Gum 01/16/2011, 12:14 PM  01/16/2011  Jamul Bing, PT [phone] (pager)

## 2011-01-16 NOTE — Progress Notes (Signed)
9 Days Post-Op Procedure(s) (LRB): AORTIC VALVE REPLACEMENT (AVR) (N/A) Subjective: Feels much better this afternoon. Currently off o2 walked 550 ft today  Objective: Vital signs in last 24 hours: Patient Vitals for the past 24 hrs:  BP Temp Temp src Pulse Resp SpO2  01/16/11 1500 111/64 mmHg 98.7 F (37.1 C) Oral 85  16  92 %  01/16/11 0812 - - - - - 90 %  01/16/11 0631 139/70 mmHg 98.6 F (37 C) Oral 70  15  93 %  01/16/11 0154 - - - - - 92 %  01/15/11 2223 113/62 mmHg 97.4 F (36.3 C) Oral 88  18  90 %  01/15/11 2023 - - - - - 96 %         Intake/Output from previous day: 11/20 0701 - 11/21 0700 In: 480 [P.O.:480] Out: 1475 [Urine:1475] Intake/Output this shift:    General appearance: alert and cooperative Neurologic: intact Heart: regular rate and rhythm, S1, S2 normal, no murmur, click, rub or gallop and normal apical impulse Lungs: clear to auscultation bilaterally and normal percussion bilaterally Wound: no drainage bone intact  Lab Results: CBC:  Basename 01/14/11 0550  WBC 10.6*  HGB 10.4*  HCT 30.2*  PLT 146*   BMET:   Basename 01/14/11 0550  NA 140  K 3.7  CL 103  CO2 26  GLUCOSE 103*  BUN 26*  CREATININE 1.03  CALCIUM 9.1   Dg Chest 2 View  01/16/2011  *RADIOLOGY REPORT*  Clinical Data: Follow up cardiac surgery  CHEST - 2 VIEW  Comparison: 01/14/2011  Findings: Aortic valve replacement in satisfactory position.  Left atrial clip in satisfactory position and unchanged.  Mild cardiac enlargement.  Bilateral pleural effusions are unchanged.  Negative for edema.  Bibasilar atelectasis unchanged.  IMPRESSION: Bibasilar pleural effusion and atelectasis, unchanged.  Negative for edema.  Original Report Authenticated By: Camelia Phenes, M.D.     Assessment/Plan: S/P Procedure(s) (LRB): AORTIC VALVE REPLACEMENT (AVR) (N/A) Mobilize Diuresis Diabetes control Plan for discharge: see discharge orders Poss home tomorrow   LOS: 9 days     Ronald [name] B 01/16/2011

## 2011-01-16 NOTE — Progress Notes (Signed)
Subjective:  CBGs OK despite starting Prednisone for 3 days. Did not sleep well last night - too much on mind. Coughing has improved. SOB improving. DOE also improving. He was worried @ falling asleep without O2. He reports strength improving. He used Flutter valve and IC hrly. No CP Congestion loosening up. Hopefully turning the corner.  Objective: Vital signs in last 24 hours: Temp:  [97.4 F (36.3 C)-98.7 F (37.1 C)] 98.6 F (37 C) (11/21 0631) Pulse Rate:  [70-88] 70  (11/21 0631) Resp:  [15-18] 15  (11/21 0631) BP: (104-139)/(61-70) 139/70 mmHg (11/21 0631) SpO2:  [90 %-96 %] 93 % (11/21 0631) Weight change:  Last BM Date: 01/15/11  Intake/Output from previous day: 11/20 0701 - 11/21 0700 In: 480 [P.O.:480] Out: 1475 [Urine:1475] Intake/Output this shift:    PE) General appearance: alert and oriented.    Looks a little better.  No distress Wearing FIO2. Neurologic: intact  Heart: regular rate and rhythm, S1, S2 normal.  Tele NSR.  Sternotomy  Lungs: diminished breath sounds bibasilar - but appears to be moving air some better.    Abdomen: soft, non-tender; bowel sounds normal; no masses, no organomegaly  Extremities: extremities normal but some edema - improving. Wound: Patient's wound is per CVTS.  Lab Results:  [facility] 01/14/11 0550  WBC 10.6*  HGB 10.4*  HCT 30.2*  PLT 146*   BMET  Basename 01/14/11 0550  NA 140  K 3.7  CL 103  CO2 26  GLUCOSE 103*  BUN 26*  CREATININE 1.03  CALCIUM 9.1    Studies/Results: No results found.   Assessment/Plan: Principal Problem:  *S/P aortic valve replacement: #23 Magna Ease Edwards Pericardial Valve   Active Problems:  DIABETES MELLITUS, TYPE II  HYPERLIPIDEMIA  CAD  AS (aortic stenosis)  History of atrial fibrillation  Thrombocytopenia  Leukocytosis  Problem # 1: AORTIC STENOSIS (ICD-424.1)  S/P  AORTIC VALVE REPLACEMENT (AVR) with pericardial tissue valve and placement of the atrial clip due  to severe AS in Pt with H/O Rheumatic Heart Disease (Rheumatic Fever 1944) Getting stronger.  Diuresing. Sternal drainage per CVTS.   Problem # 2: DIABETES MELLITUS, TYPE II, CONTROLLED, W/VASCULAR COMPS (ICD-250.70)  CBG (last 3)   Basename 01/16/11 0617 01/15/11 2306 01/15/11 1640  GLUCAP 117* 171* 207*    On Orals and Lantus and ISS.  CBGs staying stable - despite steroids.  #3. Bronchitis/Hypoxia/cough/URI - No PNA on CXR - WBC better.  Finish empiric Avelox, D#6 of 7 for presumed bronchitis. Continue pulm toilet. Wean O2 as able or D/c on FIO2 Cont  On mucolytics, inhalers, IS, FV.   Tessalon. May need Home O2 Pred may be helping or just coincidental?   #5. Thrombocytopenia - plts improved. Consider restarting Plavix at D/C      LOS: 9 days   Dell Briner M 01/16/2011, 7:29 AM

## 2011-01-17 LAB — COMPREHENSIVE METABOLIC PANEL
ALT: 57 U/L — ABNORMAL HIGH (ref 0–53)
AST: 30 U/L (ref 0–37)
Albumin: 2.8 g/dL — ABNORMAL LOW (ref 3.5–5.2)
Alkaline Phosphatase: 86 U/L (ref 39–117)
BUN: 32 mg/dL — ABNORMAL HIGH (ref 6–23)
CO2: 27 mEq/L (ref 19–32)
Calcium: 9.2 mg/dL (ref 8.4–10.5)
Chloride: 104 mEq/L (ref 96–112)
Creatinine, Ser: 1.13 mg/dL (ref 0.50–1.35)
GFR calc Af Amer: 66 mL/min — ABNORMAL LOW (ref >90)
GFR calc non Af Amer: 57 mL/min — ABNORMAL LOW (ref >90)
Glucose, Bld: 67 mg/dL — ABNORMAL LOW (ref 70–99)
Potassium: 3.5 mEq/L (ref 3.5–5.1)
Sodium: 142 mEq/L (ref 135–145)
Total Bilirubin: 0.4 mg/dL (ref 0.3–1.2)
Total Protein: 6.1 g/dL (ref 6.0–8.3)

## 2011-01-17 LAB — GLUCOSE, CAPILLARY
Glucose-Capillary: 107 mg/dL — ABNORMAL HIGH (ref 70–99)
Glucose-Capillary: 69 mg/dL — ABNORMAL LOW (ref 70–99)
Glucose-Capillary: 249 mg/dL — ABNORMAL HIGH (ref 70–99)

## 2011-01-17 LAB — CBC
HCT: 28.5 % — ABNORMAL LOW (ref 39.0–52.0)
Hemoglobin: 9.6 g/dL — ABNORMAL LOW (ref 13.0–17.0)
MCH: 31.5 pg (ref 26.0–34.0)
MCHC: 33.7 g/dL (ref 30.0–36.0)
MCV: 93.4 fL (ref 78.0–100.0)
Platelets: 219 10*3/uL (ref 150–400)
RBC: 3.05 MIL/uL — ABNORMAL LOW (ref 4.22–5.81)
RDW: 14.8 % (ref 11.5–15.5)
WBC: 14.9 10*3/uL — ABNORMAL HIGH (ref 4.0–10.5)

## 2011-01-17 MED ORDER — TRAMADOL HCL 50 MG PO TABS: 50.0000 mg | ORAL_TABLET | Freq: Four times a day (QID) | ORAL | Status: DC | PRN

## 2011-01-17 MED ORDER — INSULIN GLARGINE 100 UNIT/ML ~~LOC~~ SOLN: 15.0000 [IU] | Freq: Every day | SUBCUTANEOUS | Status: DC

## 2011-01-17 MED ORDER — POLYSACCHARIDE IRON 150 MG PO CAPS
150.0000 mg | ORAL_CAPSULE | Freq: Every day | ORAL | Status: DC
  Administered 2011-01-17: 150 mg via ORAL
  Filled 2011-01-17: qty 1

## 2011-01-17 MED ORDER — CLOPIDOGREL BISULFATE 75 MG PO TABS: 75.0000 mg | ORAL_TABLET | Freq: Every day | ORAL | Status: DC

## 2011-01-17 MED ORDER — FOLIC ACID 1 MG PO TABS: 1.0000 mg | ORAL_TABLET | Freq: Every day | ORAL | Status: DC

## 2011-01-17 MED ORDER — TRAMADOL HCL 50 MG PO TABS: 50.0000 mg | ORAL_TABLET | Freq: Four times a day (QID) | ORAL | Status: AC | PRN

## 2011-01-17 MED ORDER — GUAIFENESIN ER 600 MG PO TB12: 600.0000 mg | ORAL_TABLET | Freq: Two times a day (BID) | ORAL | Status: DC

## 2011-01-17 MED ORDER — POLYSACCHARIDE IRON 150 MG PO CAPS: 150.0000 mg | ORAL_CAPSULE | Freq: Every day | ORAL | Status: DC

## 2011-01-17 MED ORDER — FLUTICASONE-SALMETEROL 250-50 MCG/DOSE IN AEPB: 1.0000 | INHALATION_SPRAY | Freq: Two times a day (BID) | RESPIRATORY_TRACT | Status: DC

## 2011-01-17 MED ORDER — POTASSIUM CHLORIDE CRYS ER 10 MEQ PO TBCR
40.0000 meq | EXTENDED_RELEASE_TABLET | Freq: Once | ORAL | Status: AC
  Administered 2011-01-17: 40 meq via ORAL
  Filled 2011-01-17: qty 4

## 2011-01-17 MED ORDER — FOLIC ACID 1 MG PO TABS
1.0000 mg | ORAL_TABLET | Freq: Every day | ORAL | Status: DC
  Administered 2011-01-17: 1 mg via ORAL
  Filled 2011-01-17: qty 1

## 2011-01-17 MED ORDER — GLYBURIDE-METFORMIN 5-500 MG PO TABS: 1.0000 | ORAL_TABLET | Freq: Two times a day (BID) | ORAL | Status: DC

## 2011-01-17 MED ORDER — GLYBURIDE-METFORMIN 5-500 MG PO TABS: 0.5000 | ORAL_TABLET | Freq: Two times a day (BID) | ORAL | Status: DC

## 2011-01-17 NOTE — Progress Notes (Signed)
Patient discharged in stable condition home with all belongings and family. Medications and discharge instructions reviewed prior to discharge. 

## 2011-01-17 NOTE — Discharge Summary (Addendum)
Addendum Note to D/C Ronald [name] is a 77 y.o. male who is S/P Procedure(s): AORTIC VALVE REPLACEMENT (AVR). The patient is stable and ready for discharge.   Admission Date:01/02/2011 Discharge Date:01/17/2011  Brief Hospital Course Stay (since last dictation):  Patient had complaints of cough, weakness, and fatigue. He also developed some sero sanguinous drainage (there was no erythema) from his lower sternal wound.   He was given one week of Avelox for presumed bronchitis.  He was also given several doses of Prednisone as well.  He did require a couple of liters of oxygen for several days.  He was able to be weaned to RA.  His last WBC was increased from 10,600 to 14,900 ,but this is likely attributable to the steroids. He remains afebrile, has no GU complaints, and  barely has any sero sanguinous drainage (NO erythema) from his lower sternal incision.   He will need to follow up with Dr. Timothy Lasso for further diabetes management.  He was seen and evaluated by Dr. Tyrone Sage this morning.  He is felt surgically stable for discharge.  The only changes to his medications were the addition of Plavix 75 mg po daily.  Ronald [name] 01/17/2011 12:05 PM   Please have Dr. Tyrone Sage co sign

## 2011-01-17 NOTE — Progress Notes (Signed)
Subjective: Doing well, felt a bit weak this AM, but wants to go for a walk right now.  No other issues.  Had a 69 after a CBG in the 190's last night (overcorrected)  Objective: Vital signs in last 24 hours: Temp:  [98 F (36.7 C)-98.7 F (37.1 C)] 98 F (36.7 C) (11/22 0508) Pulse Rate:  [79-85] 80  (11/22 0508) Resp:  [16-18] 18  (11/22 0508) BP: (111-147)/(64-75) 147/75 mmHg (11/22 0508) SpO2:  [92 %-94 %] 92 % (11/22 0508) Weight change:  Last BM Date: 01/16/11  Intake/Output from previous day: 11/21 0701 - 11/22 0700 In: 243 [P.O.:240; I.V.:3] Out: 300 [Urine:300] Intake/Output this shift: Total I/O In: -  Out: 225 [Urine:225]  General appearance: alert and appears stated age Resp: clear to auscultation bilaterally Cardio: regular rate and rhythm, S1, S2 normal, no murmur, click, rub or gallop Extremities: extremities normal, atraumatic, no cyanosis or edema  Lab Results:  Basename 01/17/11 0630  WBC 14.9*  HGB 9.6*  HCT 28.5*  PLT 219   BMET  Basename 01/17/11 0630  NA 142  K 3.5  CL 104  CO2 27  GLUCOSE 67*  BUN 32*  CREATININE 1.13  CALCIUM 9.2    Studies/Results: Dg Chest 2 View  01/16/2011  *RADIOLOGY REPORT*  Clinical Data: Follow up cardiac surgery  CHEST - 2 VIEW  Comparison: 01/14/2011  Findings: Aortic valve replacement in satisfactory position.  Left atrial clip in satisfactory position and unchanged.  Mild cardiac enlargement.  Bilateral pleural effusions are unchanged.  Negative for edema.  Bibasilar atelectasis unchanged.  IMPRESSION: Bibasilar pleural effusion and atelectasis, unchanged.  Negative for edema.  Original Report Authenticated By: Camelia Phenes, M.D.    Medications: I have reviewed the patient's current medications.  Assessment/Plan: Problem # 1: AORTIC STENOSIS (ICD-424.1) S/P AORTIC VALVE REPLACEMENT (AVR) with pericardial tissue valve and placement of the atrial clip due to severe AS in Pt with H/O Rheumatic Heart  Disease (Rheumatic Fever 1944)  Getting stronger. Diuresing.  Sternal drainage per CVTS.  Problem # 2: DIABETES MELLITUS, TYPE II, CONTROLLED, W/VASCULAR COMPS (ICD-250.70)  Had a mild low following an HS high, which indicates an overcorrection.  No change to Basals. Since it sounds like he is going home today, will set up a visit with Timothy Lasso and our diabetic educator next week.  Will have the office call him on Monday to arrange. On Orals and Lantus and ISS, would not continue the sliding scale at home for ease of administration.  Must check BID at home and bring in records to office. CBGs staying stable - despite steroids.  #3. Bronchitis/Hypoxia/cough/URI - No PNA on CXR - WBC better. Finish empiric Avelox,Continue pulm toilet. Wean O2 as able or D/c on FIO2  Cont On mucolytics, inhalers, IS, FV. Tessalon.  May need Home O2      LOS: 10 days   Staysha Truby W 01/17/2011, 9:59 AM

## 2011-01-17 NOTE — Progress Notes (Addendum)
10 Days Post-Op Procedure(s) (LRB): AORTIC VALVE REPLACEMENT (AVR) (N/A)  Subjective: Patient is feeling better this morning-hopes to go home.  Objective: Vital signs in last 24 hours: Patient Vitals for the past 24 hrs:  BP Temp Temp src Pulse Resp SpO2  01/17/11 0508 147/75 mmHg 98 F (36.7 C) Oral 80  18  92 %  01/17/11 0216 - - - - - 93 %  01/16/11 2125 129/70 mmHg - - 79  - -  01/16/11 2119 129/70 mmHg 98.7 F (37.1 C) Oral 79  18  94 %  01/16/11 2041 - - - - - 93 %  01/16/11 1500 111/64 mmHg 98.7 F (37.1 C) Oral 85  16  92 %   Pre op weight  82 kg Current Weight  01/15/11 192 lb 3.2 oz (87.181 kg)       Intake/Output from previous day: 11/21 0701 - 11/22 0700 In: 243 [P.O.:240; I.V.:3] Out: 300 [Urine:300]   Physical Exam:  Cardiovascular: RRR, no murmurs, gallops, or rubs. Pulmonary: Slightly decreased at bases bilaterally; no rales, wheezes, or rhonchi. Abdomen: Soft, non tender, bowel sounds present. Extremities: Trace bilateral lower extremity edema. Wounds: Clean and dry.  No erythema or signs of infection. Inferior sternal wound with a small amount of dried sero sanguinous drainage.  Lab Results: CBC: Basename 01/17/11 0630  WBC 14.9*  HGB 9.6*  HCT 28.5*  PLT 219   BMET:  Basename 01/17/11 0630  NA 142  K 3.5  CL 104  CO2 27  GLUCOSE 67*  BUN 32*  CREATININE 1.13  CALCIUM 9.2    PT/INR: No results found for this basename: LABPROT,INR in the last 72 hours ABG:  INR: Will add last result for INR, ABG once components are confirmed Will add last 4 CBG results once components are confirmed  Assessment/Plan:  1. CV - SR. Continue Lopressor 50 bid. 2.  Pulmonary - Day #6/7 for Avelox (possible bronchitis).   Given Prednisone for 3 doses. 3. Volume Overload - Continue with daily Lasix. 4.  Acute blood loss anemia - H/H slightly decreased from 10.4/30.2 to 9.6/28.5. Start Nu Iron and Folic Acid. 5.Supplement Potassium. 6.DM- CBGs  192/69/107. Continue with current medications. Pre op HGA1 C 8 so will need follow up as an outpatient. 7. Hopefully, discharge today.  ZIMMERMAN,DONIELLE M, PA 01/17/2011 Now off o2 , walked without o2 today  No fever, lungs sound better, wound stable. Plan DC today. Will resume Plavix at home no ASA because of allergy [name] 01/17/2011 11:15 AM

## 2011-01-17 NOTE — Progress Notes (Signed)
CBG: 69  Treatment: 15 GM carbohydrate snack  Symptoms: None   Follow-up CBG: Time:0700 CBG Result: 107  Possible Reasons for Event: Inadequate meal intake   Comments/MD notified:pt. Stable and asymptomatic    Evalee Jefferson

## 2011-01-22 MED ORDER — VANCOMYCIN HCL 1000 MG IV SOLR
1000.0000 mg | INTRAVENOUS | Status: DC | PRN
  Administered 2011-01-07: 1000 g via INTRAVENOUS

## 2011-01-22 NOTE — Addendum Note (Signed)
Addendum  created 01/22/11 1210 by Pincus Sanes Moselle Rister   Modules edited:Anesthesia Medication Administration

## 2011-01-28 ENCOUNTER — Other Ambulatory Visit: Payer: Self-pay | Admitting: Cardiothoracic Surgery

## 2011-01-28 DIAGNOSIS — I359 Nonrheumatic aortic valve disorder, unspecified: Secondary | ICD-10-CM

## 2011-01-31 ENCOUNTER — Encounter: Payer: Self-pay | Admitting: Cardiothoracic Surgery

## 2011-01-31 ENCOUNTER — Ambulatory Visit (INDEPENDENT_AMBULATORY_CARE_PROVIDER_SITE_OTHER): Payer: Medicare Other | Admitting: Cardiothoracic Surgery

## 2011-01-31 ENCOUNTER — Ambulatory Visit
Admission: RE | Admit: 2011-01-31 | Discharge: 2011-01-31 | Disposition: A | Discharge status: Home / Self Care | Payer: BLUE CROSS/BLUE SHIELD | Source: Ambulatory Visit | Attending: Cardiothoracic Surgery | Admitting: Cardiothoracic Surgery

## 2011-01-31 VITALS — BP 155/72 | HR 68 | Resp 20 | Ht 71.0 in | Wt 174.0 lb

## 2011-01-31 DIAGNOSIS — Z954 Presence of other heart-valve replacement: Secondary | ICD-10-CM

## 2011-01-31 DIAGNOSIS — I350 Nonrheumatic aortic (valve) stenosis: Secondary | ICD-10-CM

## 2011-01-31 DIAGNOSIS — I359 Nonrheumatic aortic valve disorder, unspecified: Secondary | ICD-10-CM

## 2011-01-31 DIAGNOSIS — Z952 Presence of prosthetic heart valve: Secondary | ICD-10-CM

## 2011-01-31 NOTE — Progress Notes (Signed)
[address]            [place] [address]          [phone]      Ronald [name] [place] Medical Record #[id number] Date of Birth: Dec 18, 1922  Referring: [name], MD Primary Care: [name], MD, MD  Chief Complaint:   POST OP FOLLOW UP Aortic valve replacement with a 23 mm pericardial  tissue valve, [employer] model 3300 TFX, serial #[id number] and  placement of left atrial clip. 01/07/2011  History of Present Illness:     Patient returns after his recent aortic valve replacement. He has noted some fatigue and weight loss. Though he notes it is increasing his activity appropriately has had no overt symptoms of congestive heart failure    Past Medical History  Diagnosis Date  . AS (aortic stenosis)   . DM2 (diabetes mellitus, type 2)   . HTN (hypertension)   . Heart murmur   . Thrombocytopenia due to drugs     seen by [name] plts 114000 no rx  . Cancer     Bladder Cancer local  . Neuropathy due to secondary diabetes   . Dysrhythmia 5-7 weeks ago ... sees [name]  . Peripheral vascular disease very poor circulation legs and feet ... stents right and left legs... done in [name] 's office.   . Depression wife died 4 years ago.       History  Smoking status  . Current Everyday Smoker -- 2.0 packs/day  . Types: Cigarettes  Smokeless tobacco  . Not on file    History  Alcohol Use  . 1.2 oz/week  . 2 Cans of beer per week    2 beers a week     Allergies  Allergen Reactions  . Aspirin Shortness Of Breath and Rash    Current Outpatient Prescriptions  Medication Sig Dispense Refill  . acetaminophen (TYLENOL) 500 MG tablet Take 2 tablets (1,000 mg total) by mouth every 6 (six) hours as needed. For pain   30 tablet    . clopidogrel (PLAVIX) 75 MG tablet Take 1 tablet (75 mg total) by mouth daily.      Marland Kitchen doxazosin (CARDURA) 4 MG tablet Take 1 tablet (4 mg total) by mouth at bedtime.      .  finasteride (PROSCAR) 5 MG tablet Take 5 mg by mouth daily.        . Fluticasone-Salmeterol (ADVAIR) 250-50 MCG/DOSE AEPB Inhale 1 puff into the lungs 2 (two) times daily.  28 each  0  . folic acid (FOLVITE) 1 MG tablet Take 1 tablet (1 mg total) by mouth daily.      . furosemide (LASIX) 20 MG tablet Take 20 mg by mouth daily.        Marland Kitchen glyBURIDE (DIABETA) 5 MG tablet Take 5 mg by mouth 2 (two) times daily with a meal.        . guaiFENesin (MUCINEX) 600 MG 12 hr tablet Take 1 tablet (600 mg total) by mouth 2 (two) times daily. Patient to take for cough.      . insulin glargine (LANTUS) 100 UNIT/ML injection Inject 15 Units into the skin at bedtime.        Marland Kitchen loratadine (CLARITIN) 10 MG tablet Take 1 tablet (10 mg total) by mouth daily.      . metoprolol  tartrate (LOPRESSOR) 50 MG tablet Take 1 tablet (50 mg total) by mouth 2 (two) times daily.  60 tablet  1  . polysaccharide iron (NIFEREX) 150 MG CAPS capsule Take 1 capsule (150 mg total) by mouth daily.  30 each  0  . potassium chloride (KLOR-CON) 10 MEQ CR tablet Take 20 mEq by mouth daily.        . simvastatin (ZOCOR) 20 MG tablet Take 20 mg by mouth at bedtime.             Physical Exam: BP 155/72  Pulse 68  Resp 20  Ht 5\' 11"  (1.803 m)  Wt 174 lb (78.926 kg)  BMI 24.27 kg/m2  SpO2 98%  General appearance: alert, cooperative, fatigued and no distress Neurologic: intact Heart: regular rate and rhythm, S1, S2 normal, no murmur, click, rub or gallop, normal apical impulse and no rub Lungs: clear to auscultation bilaterally Abdomen: soft, non-tender; bowel sounds normal; no masses,  no organomegaly Extremities: extremities normal, atraumatic, no cyanosis or edema, Homans sign is negative, no sign of DVT, no edema, redness or tenderness in the calves or thighs and no ulcers, gangrene or trophic changes Wound: Wound is intact sternum is stable and well-healed   Diagnostic Studies & Laboratory data:     Recent Radiology Findings:    Dg Chest 2 View  01/31/2011  *RADIOLOGY REPORT*  Clinical Data: Status post valve replacement  CHEST - 2 VIEW  Comparison: January 16, 2011  Findings: The aortic valve prosthesis and left atrial clip are stable in position.  Tiny bilateral pleural effusions persist which have decreased in size compared with the prior study. The cardiac silhouette is now within normal limits.  The mediastinum pulmonary vasculature are also normal.  IMPRESSION: Interval decrease in size of small bilateral pleural effusions.  Original Report Authenticated By: Brandon Melnick, M.D.      Recent Lab Findings: Lab Results  Component Value Date   WBC 14.9* 01/17/2011   HGB 9.6* 01/17/2011   HCT 28.5* 01/17/2011   PLT 219 01/17/2011   GLUCOSE 67* 01/17/2011   ALT 57* 01/17/2011   AST 30 01/17/2011   NA 142 01/17/2011   K 3.5 01/17/2011   CL 104 01/17/2011   CREATININE 1.13 01/17/2011   BUN 32* 01/17/2011   CO2 27 01/17/2011   INR 1.69* 01/07/2011   HGBA1C 8.0* 01/03/2011      Assessment / Plan:      Overall the patient is making good progress following aortic valve replacement. He does not have any evidence of excessive fluid overload at this point. He'll continue to take Lasix 20 mg a day. He's to enroll in cardiac rehabilitation program next week. I plan to see him back in one month.      [name] 01/31/2011 4:54 PM

## 2011-01-31 NOTE — Patient Instructions (Signed)
With the patient's prosthetic heart valve the risks of endocarditis have been discussed. The recommendations for periprocedural antibiotics including dental procedures have been discussed with the patient.    Aortic Valve Replacement Care After Read the instructions outlined below and refer to this sheet for the next few weeks. These discharge instructions provide you with general information on caring for yourself after you leave the hospital. Your surgeon may also give you specific instructions. While your treatment has been planned according to the most current medical practices available, unavoidable complications occasionally occur. If you have any problems or questions after discharge, please call your surgeon. AFTER THE PROCEDURE  Full recovery from heart valve surgery can take several months.     Blood thinning (anticoagulation) treatment with warfarin is often prescribed for 6 weeks to 3 months after surgery for those with biological valves. It is prescribed for life for those with mechanical valves.     Recovery includes healing of the surgical incision. There is a gradual building of stamina and exercise abilities. An exercise program under the direction of a physical therapist may be recommended.     Once you have an artificial valve, your heart function and your life will return to normal. You usually feel better after surgery. Shortness of breath and fatigue should lessen. If your heart was already severely damaged before your surgery, you may continue to have problems.     You can usually resume most of your normal activities. You will have to continue to monitor your condition. You need to watch out for blood clots and infections.     Artificial valves need to be replaced after a period of time. It is important that you see your caregiver regularly.     Some individuals with an aortic valve replacement need to take antibiotics before having dental work or other surgical  procedures. This is called prophylactic antibiotic treatment. These drugs help to prevent infective endocarditis. Antibiotics are only recommended for individuals with the highest risk for developing infective endocarditis. Let your dentist and your caregiver know if you have a history of any of the following so that the necessary precautions can be taken:     A VSD.     A repaired VSD.     Endocarditis in the past.     An artificial (prosthetic) heart valve.  HOME CARE INSTRUCTIONS    Use all medications as prescribed.     Take your temperature every morning for the first week after surgery. Record these.     Weigh yourself every morning for at least the first week after surgery and record.     Do not lift more than 10 pounds (4.5 kg) until your breastbone (sternum) has healed. Avoid all activities which would place strain on your incision.     You may shower as soon as directed by your caregiver after surgery. Pat incisions dry. Do not rub incisions with washcloth or towel.     Avoid driving for 4 to 6 weeks following surgery or as instructed.     Use your elastic stockings during the day. You should wear the stockings for at least 2 weeks after discharge or longer if your ankles are swollen. The stockings help blood flow and help reduce swelling in the legs. It is easiest to put the stockings on before you get out of bed in the morning. They should fit snugly.  Pain Control  If a prescription was given for a pain reliever, please follow your doctor's  directions.     If the pain is not relieved by your medicine, becomes worse, or you have difficulty breathing, call your surgeon.  Activity  Take frequent rest periods throughout the day.     Wait one week before returning to strenuous activities such as heavy lifting (more than 10 pounds), pushing or pulling.     Talk with your doctor about when you may return to work and your exercise routine.     Do not drive while taking  prescription pain medication.  Nutrition  You may resume your normal diet.     Drink plenty of fluids (6-8 glasses a day).     Eat a well-balanced diet.     Call your caregiver for persistent nausea or vomiting.  Elimination Your normal bowel function should return. If constipation should occur, you may:  Take a mild laxative.     Add fruit and bran to your diet.     Drink more fluids.     Call your doctor if constipation is not relieved.  SEEK IMMEDIATE MEDICAL CARE IF:    You develop chest pain which is not coming from your surgical cut (incision).     You develop shortness of breath or have difficulty breathing.     You develop a temperature over 101 F (38.3 C).     You have a sudden weight gain. Let your caregiver know what the weight gain is.     You develop a rash.     You develop any reaction or side effects to medications given.     You have increased bleeding from wounds.     You see redness, swelling, or have increasing pain in wounds.     You have pus coming from your wound.     You develop lightheadedness or feel faint.  Document Released: 08/30/2004 Document Revised: 10/24/2010 Document Reviewed: 11/21/2004 [facility] Patient Information 2012 [place], [place].

## 2011-02-05 MED FILL — Heparin Sodium (Porcine) Inj 1000 Unit/ML: INTRAMUSCULAR | Qty: 60 | Status: AC

## 2011-02-05 MED FILL — Sodium Chloride IV Soln 0.9%: INTRAVENOUS | Qty: 1000 | Status: AC

## 2011-02-05 MED FILL — Electrolyte-R (PH 7.4) Solution: INTRAVENOUS | Qty: 4000 | Status: AC

## 2011-02-05 MED FILL — Sodium Chloride Irrigation Soln 0.9%: Qty: 3000 | Status: AC

## 2011-02-07 ENCOUNTER — Encounter (HOSPITAL_COMMUNITY)
Admission: RE | Admit: 2011-02-07 | Discharge: 2011-02-07 | Disposition: A | Discharge status: Home / Self Care | Payer: Medicare Other | Source: Ambulatory Visit | Attending: Cardiovascular Disease | Admitting: Cardiovascular Disease

## 2011-02-07 ENCOUNTER — Encounter (HOSPITAL_COMMUNITY): Payer: Self-pay

## 2011-02-07 DIAGNOSIS — I798 Other disorders of arteries, arterioles and capillaries in diseases classified elsewhere: Secondary | ICD-10-CM | POA: Insufficient documentation

## 2011-02-07 DIAGNOSIS — E119 Type 2 diabetes mellitus without complications: Secondary | ICD-10-CM | POA: Insufficient documentation

## 2011-02-07 DIAGNOSIS — I4891 Unspecified atrial fibrillation: Secondary | ICD-10-CM | POA: Insufficient documentation

## 2011-02-07 DIAGNOSIS — I6529 Occlusion and stenosis of unspecified carotid artery: Secondary | ICD-10-CM | POA: Insufficient documentation

## 2011-02-07 DIAGNOSIS — G2581 Restless legs syndrome: Secondary | ICD-10-CM | POA: Insufficient documentation

## 2011-02-07 DIAGNOSIS — N189 Chronic kidney disease, unspecified: Secondary | ICD-10-CM | POA: Insufficient documentation

## 2011-02-07 DIAGNOSIS — I658 Occlusion and stenosis of other precerebral arteries: Secondary | ICD-10-CM | POA: Insufficient documentation

## 2011-02-07 DIAGNOSIS — F329 Major depressive disorder, single episode, unspecified: Secondary | ICD-10-CM | POA: Insufficient documentation

## 2011-02-07 DIAGNOSIS — F17200 Nicotine dependence, unspecified, uncomplicated: Secondary | ICD-10-CM | POA: Insufficient documentation

## 2011-02-07 DIAGNOSIS — Z7902 Long term (current) use of antithrombotics/antiplatelets: Secondary | ICD-10-CM | POA: Insufficient documentation

## 2011-02-07 DIAGNOSIS — I359 Nonrheumatic aortic valve disorder, unspecified: Secondary | ICD-10-CM | POA: Insufficient documentation

## 2011-02-07 DIAGNOSIS — I739 Peripheral vascular disease, unspecified: Secondary | ICD-10-CM | POA: Insufficient documentation

## 2011-02-07 DIAGNOSIS — E785 Hyperlipidemia, unspecified: Secondary | ICD-10-CM | POA: Insufficient documentation

## 2011-02-07 DIAGNOSIS — F3289 Other specified depressive episodes: Secondary | ICD-10-CM | POA: Insufficient documentation

## 2011-02-07 DIAGNOSIS — Z9889 Other specified postprocedural states: Secondary | ICD-10-CM | POA: Insufficient documentation

## 2011-02-07 DIAGNOSIS — I129 Hypertensive chronic kidney disease with stage 1 through stage 4 chronic kidney disease, or unspecified chronic kidney disease: Secondary | ICD-10-CM | POA: Insufficient documentation

## 2011-02-07 DIAGNOSIS — Z85828 Personal history of other malignant neoplasm of skin: Secondary | ICD-10-CM | POA: Insufficient documentation

## 2011-02-07 DIAGNOSIS — Z5189 Encounter for other specified aftercare: Secondary | ICD-10-CM | POA: Insufficient documentation

## 2011-02-07 DIAGNOSIS — I872 Venous insufficiency (chronic) (peripheral): Secondary | ICD-10-CM | POA: Insufficient documentation

## 2011-02-07 DIAGNOSIS — Z8551 Personal history of malignant neoplasm of bladder: Secondary | ICD-10-CM | POA: Insufficient documentation

## 2011-02-11 ENCOUNTER — Encounter (HOSPITAL_COMMUNITY)
Admission: RE | Admit: 2011-02-11 | Discharge: 2011-02-11 | Disposition: A | Discharge status: Home / Self Care | Payer: Medicare Other | Source: Ambulatory Visit | Attending: Cardiovascular Disease | Admitting: Cardiovascular Disease

## 2011-02-11 LAB — GLUCOSE, CAPILLARY: Glucose-Capillary: 203 mg/dL — ABNORMAL HIGH (ref 70–99)

## 2011-02-11 NOTE — Progress Notes (Signed)
Pt started cardiac rehab today.  Pt tolerated light exercise without difficulty. Telemetry rhythm sinus with a first degree heart block. Occasional PAC and sinus arrythmia noted.  Will fax ECG tracings to Dr Hazle Coca office for review.

## 2011-02-13 ENCOUNTER — Encounter (HOSPITAL_COMMUNITY)
Admission: RE | Admit: 2011-02-13 | Discharge: 2011-02-13 | Disposition: A | Discharge status: Home / Self Care | Payer: Medicare Other | Source: Ambulatory Visit | Attending: Cardiovascular Disease | Admitting: Cardiovascular Disease

## 2011-02-13 LAB — GLUCOSE, CAPILLARY: Glucose-Capillary: 212 mg/dL — ABNORMAL HIGH (ref 70–99)

## 2011-02-14 NOTE — Progress Notes (Signed)
Ronald [name] 77 y.o. male       Nutrition Screen                                                                    YES  NO Do you live in a nursing home?  X   Do you eat out more than 3 times/week?   X  If yes, how many times per week do you eat out? 4  Do you have food allergies?   X If yes, what are you allergic to?  Have you gained or lost more than 10 lbs without trying?              X  If yes, how much weight have you lost and over what time period? 10 lbs gained or lost over  8 weeks  Do you want to lose weight?    X  If yes, what is a goal weight or amount of weight you would like to lose? 5 more  Do you eat alone most of the time?  X    Do you eat less than 2 meals/day?  X If yes, how many meals do you eat?  Do you drink more than 3 alcohol drinks/day?  X If yes, how many drinks per day?  Are you having trouble with constipation? * X  If yes, what are you doing to help relieve constipation? Occasional laxative  Do you have financial difficulties with buying food?*    X   Are you experiencing regular nausea/ vomiting?*     X   Do you have a poor appetite? *                                       X    Do you have trouble chewing/swallowing? *   X    Pt with diagnoses of:  X AVR/MVR/ICD      X Dyslipidemia  / HDL< 40 / LDL>70 / High TG      X >28 years old  X %  Body fat >goal / Body Mass Index >25 X HTN / BP >120/80 X DM/A1c >6 / CBG >126       Pt Risk Score   13       Diagnosis Risk Score  75       Total Risk Score   88                        X High Risk                Low Risk              HT: 70.5" Ht Readings from Last 1 Encounters:  02/07/11 5' 10.5" (1.791 m)    WT:   182.4 lb (82.9 kg) Wt Readings from Last 3 Encounters:  02/07/11 182 lb 12.2 oz (82.9 kg)  01/31/11 174 lb (78.926 kg)  01/15/11 192 lb 3.2 oz (87.181 kg)     IBW 76.8 108%IBW BMI 25.9 29.5%body fat   Meds reviewed: Glyburide, Lantus, Folic Acid, Ferrex, Lasix, Potassium  Past Medical  History  Diagnosis Date  . AS (aortic stenosis)   . DM2 (diabetes mellitus, type 2)   . HTN (hypertension)   . Heart murmur   . Thrombocytopenia due to drugs     seen by Dr Ronald [name] plts 114000 no rx  . Cancer     Bladder Cancer local  . Neuropathy due to secondary diabetes   . Dysrhythmia 5-7 weeks ago ... sees dr Ronald [name]  . Peripheral vascular disease very poor circulation legs and feet ... stents right and left legs... done in dr Ronald [name] 's office.   . Depression wife died 4 years ago.     Activity level: Pt is sedentary  Wt goal: 177-182 lb ( 80.5-82.7 kg) Current tobacco use? No      Food/Drug Interaction? No      Labs:  Lab Results  Component Value Date   HGBA1C 8.0* 01/03/2011   01/17/11 Glucose 67   LDL goal:< 70      MI, DM, Carotid or PVD and > 2:      tobacco, HTN, HDL, family h/o, lipoprotein a     > 75 yo male or        >75 yo male   Estimated Daily Nutrition Needs for: ? wt loss per pt request  1300-1800 Kcal , Total Fat 70-80gm, Saturated Fat 16-19 gm, Trans Fat 2.3-2.7 gm,  Sodium less than 1500 mg, Grams of CHO 175-250

## 2011-02-15 ENCOUNTER — Encounter (HOSPITAL_COMMUNITY): Payer: Medicare Other

## 2011-03-01 ENCOUNTER — Encounter (HOSPITAL_COMMUNITY)
Admission: RE | Admit: 2011-03-01 | Discharge: 2011-03-01 | Disposition: A | Discharge status: Home / Self Care | Payer: Medicare Other | Source: Ambulatory Visit | Attending: Cardiovascular Disease | Admitting: Cardiovascular Disease

## 2011-03-01 DIAGNOSIS — I359 Nonrheumatic aortic valve disorder, unspecified: Secondary | ICD-10-CM | POA: Diagnosis not present

## 2011-03-01 DIAGNOSIS — Z7902 Long term (current) use of antithrombotics/antiplatelets: Secondary | ICD-10-CM | POA: Insufficient documentation

## 2011-03-01 DIAGNOSIS — I6529 Occlusion and stenosis of unspecified carotid artery: Secondary | ICD-10-CM | POA: Diagnosis not present

## 2011-03-01 DIAGNOSIS — G2581 Restless legs syndrome: Secondary | ICD-10-CM | POA: Diagnosis not present

## 2011-03-01 DIAGNOSIS — F329 Major depressive disorder, single episode, unspecified: Secondary | ICD-10-CM | POA: Diagnosis not present

## 2011-03-01 DIAGNOSIS — I872 Venous insufficiency (chronic) (peripheral): Secondary | ICD-10-CM | POA: Diagnosis not present

## 2011-03-01 DIAGNOSIS — E119 Type 2 diabetes mellitus without complications: Secondary | ICD-10-CM | POA: Insufficient documentation

## 2011-03-01 DIAGNOSIS — Z8551 Personal history of malignant neoplasm of bladder: Secondary | ICD-10-CM | POA: Diagnosis not present

## 2011-03-01 DIAGNOSIS — F17200 Nicotine dependence, unspecified, uncomplicated: Secondary | ICD-10-CM | POA: Insufficient documentation

## 2011-03-01 DIAGNOSIS — E785 Hyperlipidemia, unspecified: Secondary | ICD-10-CM | POA: Diagnosis not present

## 2011-03-01 DIAGNOSIS — I798 Other disorders of arteries, arterioles and capillaries in diseases classified elsewhere: Secondary | ICD-10-CM | POA: Insufficient documentation

## 2011-03-01 DIAGNOSIS — I129 Hypertensive chronic kidney disease with stage 1 through stage 4 chronic kidney disease, or unspecified chronic kidney disease: Secondary | ICD-10-CM | POA: Insufficient documentation

## 2011-03-01 DIAGNOSIS — Z85828 Personal history of other malignant neoplasm of skin: Secondary | ICD-10-CM | POA: Diagnosis not present

## 2011-03-01 DIAGNOSIS — N189 Chronic kidney disease, unspecified: Secondary | ICD-10-CM | POA: Insufficient documentation

## 2011-03-01 DIAGNOSIS — Z5189 Encounter for other specified aftercare: Secondary | ICD-10-CM | POA: Diagnosis not present

## 2011-03-01 DIAGNOSIS — I658 Occlusion and stenosis of other precerebral arteries: Secondary | ICD-10-CM | POA: Insufficient documentation

## 2011-03-01 DIAGNOSIS — I739 Peripheral vascular disease, unspecified: Secondary | ICD-10-CM | POA: Diagnosis not present

## 2011-03-01 DIAGNOSIS — F3289 Other specified depressive episodes: Secondary | ICD-10-CM | POA: Insufficient documentation

## 2011-03-01 DIAGNOSIS — I4891 Unspecified atrial fibrillation: Secondary | ICD-10-CM | POA: Insufficient documentation

## 2011-03-01 DIAGNOSIS — Z9889 Other specified postprocedural states: Secondary | ICD-10-CM | POA: Insufficient documentation

## 2011-03-01 LAB — GLUCOSE, CAPILLARY: Glucose-Capillary: 142 mg/dL — ABNORMAL HIGH (ref 70–99)

## 2011-03-04 ENCOUNTER — Telehealth (HOSPITAL_COMMUNITY): Payer: Self-pay | Admitting: *Deleted

## 2011-03-04 ENCOUNTER — Encounter (HOSPITAL_COMMUNITY): Admission: RE | Admit: 2011-03-04 | Payer: Medicare Other | Source: Ambulatory Visit

## 2011-03-04 LAB — GLUCOSE, CAPILLARY: Glucose-Capillary: 192 mg/dL — ABNORMAL HIGH (ref 70–99)

## 2011-03-05 ENCOUNTER — Encounter (HOSPITAL_COMMUNITY): Payer: Self-pay | Admitting: *Deleted

## 2011-03-05 DIAGNOSIS — I359 Nonrheumatic aortic valve disorder, unspecified: Secondary | ICD-10-CM | POA: Diagnosis not present

## 2011-03-06 ENCOUNTER — Encounter (HOSPITAL_COMMUNITY): Payer: Medicare Other

## 2011-03-07 ENCOUNTER — Ambulatory Visit: Payer: BLUE CROSS/BLUE SHIELD | Admitting: Cardiothoracic Surgery

## 2011-03-08 ENCOUNTER — Encounter (HOSPITAL_COMMUNITY): Payer: Medicare Other

## 2011-03-08 DIAGNOSIS — I4891 Unspecified atrial fibrillation: Secondary | ICD-10-CM | POA: Diagnosis not present

## 2011-03-08 DIAGNOSIS — I060 Rheumatic aortic stenosis: Secondary | ICD-10-CM | POA: Diagnosis not present

## 2011-03-08 DIAGNOSIS — E119 Type 2 diabetes mellitus without complications: Secondary | ICD-10-CM | POA: Diagnosis not present

## 2011-03-08 DIAGNOSIS — I10 Essential (primary) hypertension: Secondary | ICD-10-CM | POA: Diagnosis not present

## 2011-03-11 ENCOUNTER — Encounter (HOSPITAL_COMMUNITY)
Admission: RE | Admit: 2011-03-11 | Discharge: 2011-03-11 | Payer: Medicare Other | Source: Ambulatory Visit | Attending: Cardiovascular Disease | Admitting: Cardiovascular Disease

## 2011-03-11 ENCOUNTER — Ambulatory Visit: Payer: BLUE CROSS/BLUE SHIELD | Admitting: Cardiothoracic Surgery

## 2011-03-11 NOTE — Progress Notes (Signed)
Ronald [name] saw Dr Allyson Sabal in the office this past Friday.  Received fax from his nurse Samara Deist.  Ronald [name] is not to return to exercise until after his cardioversion.

## 2011-03-12 ENCOUNTER — Encounter: Payer: Self-pay | Admitting: Cardiothoracic Surgery

## 2011-03-12 ENCOUNTER — Ambulatory Visit (INDEPENDENT_AMBULATORY_CARE_PROVIDER_SITE_OTHER): Payer: Self-pay | Admitting: Cardiothoracic Surgery

## 2011-03-12 VITALS — BP 159/87 | HR 89 | Resp 16 | Ht 71.0 in | Wt 184.0 lb

## 2011-03-12 DIAGNOSIS — Z09 Encounter for follow-up examination after completed treatment for conditions other than malignant neoplasm: Secondary | ICD-10-CM

## 2011-03-12 DIAGNOSIS — I359 Nonrheumatic aortic valve disorder, unspecified: Secondary | ICD-10-CM

## 2011-03-12 NOTE — Progress Notes (Signed)
[address]            [place] [address]          [phone]       SHIA EBER [place] Medical Record #[id number] Date of Birth: 10-29-1922  [name], MD [name], MD, MD  Chief Complaint:   PostOp Follow Up Visit 01/07/2011  PREOPERATIVE DIAGNOSIS: Critical aortic stenosis, atrial fibrillation.  POSTOPERATIVE DIAGNOSIS: Critical aortic stenosis, atrial fibrillation.  SURGICAL PROCEDURE: Aortic valve replacement with a 23 mm pericardial  tissue valve, [employer] model 3300 TFX, serial [id number] and  placement of left atrial clip.    History of Present Illness:     Patient returns for followup visit after his aortic valve replacement and placement of left atrial clip. Recently while in cardiac rehabilitation he had been recurrent episode of atrial fibrillation. He seen Dr. Allyson Sabal and has been started on Cardizem and Coumadin with the plan for cardioversion in several weeks. Should be noted that the time of the patient's original operation he did have the placement of a left atrial clip.    History  Smoking status  . Current Everyday Smoker -- 2.0 packs/day  . Types: Cigarettes  Smokeless tobacco  . Not on file       Allergies  Allergen Reactions  . Aspirin Shortness Of Breath and Rash    Current Outpatient Prescriptions  Medication Sig Dispense Refill  . acetaminophen (TYLENOL) 500 MG tablet Take 2 tablets (1,000 mg total) by mouth every 6 (six) hours as needed. For pain   30 tablet    . benazepril (LOTENSIN) 10 MG tablet Take 10 mg by mouth daily.      . clopidogrel (PLAVIX) 75 MG tablet Take 1 tablet (75 mg total) by mouth daily.      Marland Kitchen diltiazem (CARDIZEM) 30 MG tablet Take 20 mg by mouth 1 day or 1 dose.      Marland Kitchen doxazosin (CARDURA) 4 MG tablet Take 1 tablet (4 mg total) by mouth at bedtime.      . finasteride (PROSCAR) 5 MG tablet Take 5 mg by mouth daily.        . Fluticasone-Salmeterol (ADVAIR)  250-50 MCG/DOSE AEPB Inhale 1 puff into the lungs 2 (two) times daily.  28 each  0  . furosemide (LASIX) 20 MG tablet Take 20 mg by mouth daily.        Marland Kitchen glyBURIDE (DIABETA) 5 MG tablet Take 5 mg by mouth 2 (two) times daily with a meal.        . insulin glargine (LANTUS) 100 UNIT/ML injection Inject 15 Units into the skin at bedtime.        Marland Kitchen loratadine (CLARITIN) 10 MG tablet Take 1 tablet (10 mg total) by mouth daily.      . metoprolol succinate (TOPROL-XL) 50 MG 24 hr tablet Take 50 mg by mouth 2 (two) times daily. Take with or immediately following a meal.      . potassium chloride (KLOR-CON) 10 MEQ CR tablet Take 20 mEq by mouth daily.        . simvastatin (ZOCOR) 20 MG tablet Take 20 mg by mouth at bedtime.        Marland Kitchen warfarin (COUMADIN) 5 MG tablet Take 5 mg by mouth daily. OR AS DIRECTED      . folic acid (FOLVITE) 1 MG tablet  Take 1 tablet (1 mg total) by mouth daily.      Marland Kitchen guaiFENesin (MUCINEX) 600 MG 12 hr tablet Take 1 tablet (600 mg total) by mouth 2 (two) times daily. Patient to take for cough.      . polysaccharide iron (NIFEREX) 150 MG CAPS capsule Take 1 capsule (150 mg total) by mouth daily.  30 each  0       Physical Exam: BP 159/87  Pulse 89  Resp 16  Ht 5\' 11"  (1.803 m)  Wt 184 lb (83.462 kg)  BMI 25.66 kg/m2  SpO2 96%  General appearance: alert and cooperative Neurologic: intact Heart: irregularly irregular rhythm Lungs: clear to auscultation bilaterally Abdomen: soft, non-tender; bowel sounds normal; no masses,  no organomegaly Extremities: extremities normal, atraumatic, no cyanosis or edema Wound: sternum stable Patient has no murmur of aortic insufficiency   Diagnostic Studies & Laboratory data:         Recent Radiology Findings: No results found.    Recent Labs: Lab Results  Component Value Date   WBC 14.9* 01/17/2011   HGB 9.6* 01/17/2011   HCT 28.5* 01/17/2011   PLT 219 01/17/2011   GLUCOSE 67* 01/17/2011   ALT 57* 01/17/2011   AST 30  01/17/2011   NA 142 01/17/2011   K 3.5 01/17/2011   CL 104 01/17/2011   CREATININE 1.13 01/17/2011   BUN 32* 01/17/2011   CO2 27 01/17/2011   INR 1.69* 01/07/2011   HGBA1C 8.0* 01/03/2011      Assessment / Plan:      Patient now status post aortic valve replacement having increased fatigue probably secondary to rapid atrial fibrillation. He had an atrial clip placed at the time of his aortic valve replacement. Dr. Allyson Sabal plans electrical cardioversion in the next several weeks I'll plan to see the patient back in one month     [name] 03/12/2011 5:52 PM

## 2011-03-13 ENCOUNTER — Encounter (HOSPITAL_COMMUNITY): Payer: Medicare Other

## 2011-03-13 DIAGNOSIS — I4891 Unspecified atrial fibrillation: Secondary | ICD-10-CM | POA: Diagnosis not present

## 2011-03-13 DIAGNOSIS — Z7901 Long term (current) use of anticoagulants: Secondary | ICD-10-CM | POA: Diagnosis not present

## 2011-03-15 ENCOUNTER — Encounter (HOSPITAL_COMMUNITY): Payer: Medicare Other

## 2011-03-15 DIAGNOSIS — I4891 Unspecified atrial fibrillation: Secondary | ICD-10-CM | POA: Diagnosis not present

## 2011-03-15 DIAGNOSIS — Z7901 Long term (current) use of anticoagulants: Secondary | ICD-10-CM | POA: Diagnosis not present

## 2011-03-18 ENCOUNTER — Encounter (HOSPITAL_COMMUNITY): Payer: Medicare Other

## 2011-03-20 ENCOUNTER — Encounter (HOSPITAL_COMMUNITY): Payer: Medicare Other

## 2011-03-20 DIAGNOSIS — I2510 Atherosclerotic heart disease of native coronary artery without angina pectoris: Secondary | ICD-10-CM | POA: Diagnosis not present

## 2011-03-20 DIAGNOSIS — E1159 Type 2 diabetes mellitus with other circulatory complications: Secondary | ICD-10-CM | POA: Diagnosis not present

## 2011-03-20 DIAGNOSIS — I4891 Unspecified atrial fibrillation: Secondary | ICD-10-CM | POA: Diagnosis not present

## 2011-03-20 DIAGNOSIS — Z7901 Long term (current) use of anticoagulants: Secondary | ICD-10-CM | POA: Diagnosis not present

## 2011-03-20 DIAGNOSIS — F17200 Nicotine dependence, unspecified, uncomplicated: Secondary | ICD-10-CM | POA: Diagnosis not present

## 2011-03-20 DIAGNOSIS — I10 Essential (primary) hypertension: Secondary | ICD-10-CM | POA: Diagnosis not present

## 2011-03-20 DIAGNOSIS — N182 Chronic kidney disease, stage 2 (mild): Secondary | ICD-10-CM | POA: Diagnosis not present

## 2011-03-20 DIAGNOSIS — Z79899 Other long term (current) drug therapy: Secondary | ICD-10-CM | POA: Diagnosis not present

## 2011-03-22 ENCOUNTER — Encounter (HOSPITAL_COMMUNITY): Payer: Medicare Other

## 2011-03-22 DIAGNOSIS — E782 Mixed hyperlipidemia: Secondary | ICD-10-CM | POA: Diagnosis not present

## 2011-03-22 DIAGNOSIS — I4891 Unspecified atrial fibrillation: Secondary | ICD-10-CM | POA: Diagnosis not present

## 2011-03-22 DIAGNOSIS — E119 Type 2 diabetes mellitus without complications: Secondary | ICD-10-CM | POA: Diagnosis not present

## 2011-03-22 DIAGNOSIS — I061 Rheumatic aortic insufficiency: Secondary | ICD-10-CM | POA: Diagnosis not present

## 2011-03-25 ENCOUNTER — Encounter (HOSPITAL_COMMUNITY)
Admission: RE | Admit: 2011-03-25 | Discharge: 2011-03-25 | Disposition: A | Discharge status: Home / Self Care | Payer: Medicare Other | Source: Ambulatory Visit | Attending: Cardiovascular Disease | Admitting: Cardiovascular Disease

## 2011-03-25 ENCOUNTER — Encounter (HOSPITAL_COMMUNITY): Payer: Medicare Other

## 2011-03-25 NOTE — Progress Notes (Signed)
Ronald [name] returned to cardiac rehab today per Dr Allyson Sabal. Ronald [name]'s vital signs and heart rate were stable today.  Ronald [name] is atrial fibrillation at a controlled rate.  Will continue to monitor.

## 2011-03-27 ENCOUNTER — Encounter (HOSPITAL_COMMUNITY): Payer: Medicare Other

## 2011-03-27 ENCOUNTER — Encounter (HOSPITAL_COMMUNITY)
Admission: RE | Admit: 2011-03-27 | Discharge: 2011-03-27 | Disposition: A | Discharge status: Home / Self Care | Payer: Medicare Other | Source: Ambulatory Visit | Attending: Cardiovascular Disease | Admitting: Cardiovascular Disease

## 2011-03-27 DIAGNOSIS — Z7901 Long term (current) use of anticoagulants: Secondary | ICD-10-CM | POA: Diagnosis not present

## 2011-03-27 DIAGNOSIS — I4891 Unspecified atrial fibrillation: Secondary | ICD-10-CM | POA: Diagnosis not present

## 2011-03-27 NOTE — Progress Notes (Signed)
Reviewed home exercise with pt today.  Pt plans to walk inside for exercise, in a place where there are other people around until he feels more comfortable.  Reviewed THR, pulse, RPE, sign and symptoms,  and when to call 911 or MD.  Pt voiced understanding. Fabio Pierce, MA, ACSM RCEP

## 2011-03-28 ENCOUNTER — Telehealth (HOSPITAL_COMMUNITY): Payer: Self-pay | Admitting: *Deleted

## 2011-03-28 NOTE — Progress Notes (Signed)
Reviewed medications with Cyree's daughter Slemmer over the phone.

## 2011-03-29 ENCOUNTER — Encounter (HOSPITAL_COMMUNITY): Payer: Medicare Other

## 2011-03-29 ENCOUNTER — Encounter (HOSPITAL_COMMUNITY)
Admission: RE | Admit: 2011-03-29 | Discharge: 2011-03-29 | Disposition: A | Discharge status: Home / Self Care | Payer: Medicare Other | Source: Ambulatory Visit | Attending: Cardiovascular Disease | Admitting: Cardiovascular Disease

## 2011-03-29 DIAGNOSIS — F3289 Other specified depressive episodes: Secondary | ICD-10-CM | POA: Insufficient documentation

## 2011-03-29 DIAGNOSIS — Z7902 Long term (current) use of antithrombotics/antiplatelets: Secondary | ICD-10-CM | POA: Insufficient documentation

## 2011-03-29 DIAGNOSIS — E785 Hyperlipidemia, unspecified: Secondary | ICD-10-CM | POA: Diagnosis not present

## 2011-03-29 DIAGNOSIS — Z85828 Personal history of other malignant neoplasm of skin: Secondary | ICD-10-CM | POA: Diagnosis not present

## 2011-03-29 DIAGNOSIS — Z8551 Personal history of malignant neoplasm of bladder: Secondary | ICD-10-CM | POA: Insufficient documentation

## 2011-03-29 DIAGNOSIS — I6529 Occlusion and stenosis of unspecified carotid artery: Secondary | ICD-10-CM | POA: Insufficient documentation

## 2011-03-29 DIAGNOSIS — I739 Peripheral vascular disease, unspecified: Secondary | ICD-10-CM | POA: Insufficient documentation

## 2011-03-29 DIAGNOSIS — I872 Venous insufficiency (chronic) (peripheral): Secondary | ICD-10-CM | POA: Diagnosis not present

## 2011-03-29 DIAGNOSIS — I798 Other disorders of arteries, arterioles and capillaries in diseases classified elsewhere: Secondary | ICD-10-CM | POA: Diagnosis not present

## 2011-03-29 DIAGNOSIS — N189 Chronic kidney disease, unspecified: Secondary | ICD-10-CM | POA: Insufficient documentation

## 2011-03-29 DIAGNOSIS — F17200 Nicotine dependence, unspecified, uncomplicated: Secondary | ICD-10-CM | POA: Diagnosis not present

## 2011-03-29 DIAGNOSIS — E119 Type 2 diabetes mellitus without complications: Secondary | ICD-10-CM | POA: Diagnosis not present

## 2011-03-29 DIAGNOSIS — G2581 Restless legs syndrome: Secondary | ICD-10-CM | POA: Diagnosis not present

## 2011-03-29 DIAGNOSIS — F329 Major depressive disorder, single episode, unspecified: Secondary | ICD-10-CM | POA: Diagnosis not present

## 2011-03-29 DIAGNOSIS — I658 Occlusion and stenosis of other precerebral arteries: Secondary | ICD-10-CM | POA: Diagnosis not present

## 2011-03-29 DIAGNOSIS — I129 Hypertensive chronic kidney disease with stage 1 through stage 4 chronic kidney disease, or unspecified chronic kidney disease: Secondary | ICD-10-CM | POA: Insufficient documentation

## 2011-03-29 DIAGNOSIS — I359 Nonrheumatic aortic valve disorder, unspecified: Secondary | ICD-10-CM | POA: Diagnosis not present

## 2011-03-29 DIAGNOSIS — Z9889 Other specified postprocedural states: Secondary | ICD-10-CM | POA: Diagnosis not present

## 2011-03-29 DIAGNOSIS — I4891 Unspecified atrial fibrillation: Secondary | ICD-10-CM | POA: Diagnosis not present

## 2011-03-29 DIAGNOSIS — Z5189 Encounter for other specified aftercare: Secondary | ICD-10-CM | POA: Diagnosis not present

## 2011-03-29 LAB — GLUCOSE, CAPILLARY: Glucose-Capillary: 145 mg/dL — ABNORMAL HIGH (ref 70–99)

## 2011-04-01 ENCOUNTER — Encounter (HOSPITAL_COMMUNITY)
Admission: RE | Admit: 2011-04-01 | Discharge: 2011-04-01 | Disposition: A | Discharge status: Home / Self Care | Payer: Medicare Other | Source: Ambulatory Visit | Attending: Cardiovascular Disease | Admitting: Cardiovascular Disease

## 2011-04-01 ENCOUNTER — Encounter (HOSPITAL_COMMUNITY): Payer: Medicare Other

## 2011-04-01 ENCOUNTER — Encounter (HOSPITAL_COMMUNITY): Payer: Self-pay | Admitting: Pharmacy Technician

## 2011-04-01 ENCOUNTER — Other Ambulatory Visit: Payer: Self-pay | Admitting: Cardiology

## 2011-04-01 DIAGNOSIS — Z79899 Other long term (current) drug therapy: Secondary | ICD-10-CM | POA: Diagnosis not present

## 2011-04-01 DIAGNOSIS — I10 Essential (primary) hypertension: Secondary | ICD-10-CM | POA: Diagnosis not present

## 2011-04-01 DIAGNOSIS — R42 Dizziness and giddiness: Secondary | ICD-10-CM | POA: Diagnosis not present

## 2011-04-01 DIAGNOSIS — I4891 Unspecified atrial fibrillation: Secondary | ICD-10-CM | POA: Diagnosis not present

## 2011-04-01 DIAGNOSIS — R5381 Other malaise: Secondary | ICD-10-CM | POA: Diagnosis not present

## 2011-04-01 NOTE — Progress Notes (Signed)
Ronald [name] reported almost falling sat night at [employer].  "I almost lost my balance">  Ronald [name] reported.  Ronald [name] said he reached for a display and hit his hand.  Ronald [name] left hand is slightly swollen.  The patient applied a band aid to where he received a skin tear.  Ronald [name] said he almost fell yesterday in his dressing room.  Orthostatic blood pressures checked.  Ronald [name] reported experiencing some dizziness changing form lying to sitting position,  No complaints of symptoms for sitting to standing.  Ronald Hazle Coca office called and notofied of Ronald [name]'s complaint's.  Ronald Allyson Sabal is on vacation.  Ronald Clarene Duke said he will see Ronald [name] today in the office at 11:30.  Patient's daughter Ronald [name] called and notified. Ronald [name] is going with Ronald [name] to his Ronald appointment.  Ronald [name] did not exercise today.  Vital signs from today faxed to Ronald [name] office for review.  Ronald [name] left cardiac rehab without complaint's.

## 2011-04-03 ENCOUNTER — Other Ambulatory Visit: Payer: Self-pay

## 2011-04-03 ENCOUNTER — Encounter (HOSPITAL_COMMUNITY)
Admission: RE | Disposition: A | Discharge status: Home / Self Care | Payer: Self-pay | Source: Ambulatory Visit | Attending: Cardiology

## 2011-04-03 ENCOUNTER — Ambulatory Visit (HOSPITAL_COMMUNITY)
Admission: RE | Admit: 2011-04-03 | Discharge: 2011-04-03 | Disposition: A | Discharge status: Home / Self Care | Payer: Medicare Other | Source: Ambulatory Visit | Attending: Cardiology | Admitting: Cardiology

## 2011-04-03 ENCOUNTER — Encounter (HOSPITAL_COMMUNITY): Payer: Self-pay

## 2011-04-03 ENCOUNTER — Ambulatory Visit (HOSPITAL_COMMUNITY): Payer: Medicare Other

## 2011-04-03 ENCOUNTER — Encounter (HOSPITAL_COMMUNITY): Payer: Medicare Other

## 2011-04-03 ENCOUNTER — Encounter (HOSPITAL_COMMUNITY): Payer: Self-pay | Admitting: *Deleted

## 2011-04-03 DIAGNOSIS — I4891 Unspecified atrial fibrillation: Secondary | ICD-10-CM | POA: Insufficient documentation

## 2011-04-03 HISTORY — PX: CARDIOVERSION: SHX1299

## 2011-04-03 LAB — APTT: aPTT: 40 s — ABNORMAL HIGH (ref 24–37)

## 2011-04-03 LAB — MAGNESIUM: Magnesium: 2 mg/dL (ref 1.5–2.5)

## 2011-04-03 LAB — GLUCOSE, CAPILLARY: Glucose-Capillary: 91 mg/dL (ref 70–99)

## 2011-04-03 LAB — PROTIME-INR: Prothrombin Time: 25.5 seconds — ABNORMAL HIGH (ref 11.6–15.2)

## 2011-04-03 SURGERY — CARDIOVERSION
Anesthesia: General | Wound class: Clean

## 2011-04-03 MED ORDER — SODIUM CHLORIDE 0.45 % IV SOLN: INTRAVENOUS | Status: DC

## 2011-04-03 MED ORDER — PROPOFOL 10 MG/ML IV BOLUS
INTRAVENOUS | Status: DC | PRN
  Administered 2011-04-03: 70 mg via INTRAVENOUS

## 2011-04-03 MED ORDER — HYDROCORTISONE 1 % EX CREA
1.0000 "application " | TOPICAL_CREAM | Freq: Three times a day (TID) | CUTANEOUS | Status: DC | PRN
  Filled 2011-04-03: qty 28

## 2011-04-03 MED ORDER — SODIUM CHLORIDE 0.9 % IJ SOLN: 3.0000 mL | INTRAMUSCULAR | Status: DC | PRN

## 2011-04-03 NOTE — Anesthesia Preprocedure Evaluation (Addendum)
Anesthesia Evaluation  Patient identified by MRN, date of birth, ID band Patient awake    Reviewed: Allergy & Precautions, H&P , NPO status , Patient's Chart, lab work & pertinent test results, reviewed documented beta blocker date and time   History of Anesthesia Complications (+) DIFFICULT AIRWAY  Airway Mallampati: II TM Distance: >3 FB Neck ROM: Full    Dental   Pulmonary  clear to auscultation        Cardiovascular hypertension, Pt. on medications + CAD and + CABG + dysrhythmias Atrial Fibrillation Irregular Normal    Neuro/Psych    GI/Hepatic negative GI ROS, Neg liver ROS,   Endo/Other  Diabetes mellitus-, Insulin Dependent  Renal/GU negative Renal ROS     Musculoskeletal   Abdominal   Peds  Hematology   Anesthesia Other Findings   Reproductive/Obstetrics                           Anesthesia Physical Anesthesia Plan  ASA: III  Anesthesia Plan: General   Post-op Pain Management:    Induction: Intravenous  Airway Management Planned: Mask  Additional Equipment:   Intra-op Plan:   Post-operative Plan:   Informed Consent: I have reviewed the patients History and Physical, chart, labs and discussed the procedure including the risks, benefits and alternatives for the proposed anesthesia with the patient or authorized representative who has indicated his/her understanding and acceptance.   Dental advisory given  Plan Discussed with: CRNA, Anesthesiologist and Surgeon  Anesthesia Plan Comments:         Anesthesia Quick Evaluation

## 2011-04-03 NOTE — Op Note (Signed)
[facility]  CARDIOVERSION NOTE   Procedure: Synchronized Direct Current Cardioversion Indications:  Atrial Fibrillation  Cardiologist: Marykay Lex, MD,MS Anesthesiologist: Dr. Krista Blue  Procedure Details:  Consent: Risks of procedure as well as the alternatives and risks of each were explained to the (patient/caregiver).  Consent for procedure obtained.  Time Out: Verified patient identification, verified procedure, site/side was marked, verified correct patient position, special equipment/implants available, medications/allergies/relevent history reviewed, required imaging and test results available.  Performed  Patient placed on cardiac monitor, pulse oximetry, supplemental oxygen as necessary.  Sedation given: Propofol 70mg  Pacer pads placed anterior and posterior chest.  Cardioverted 2 time(s).  Cardioverted at 200J.  Evaluation: Findings: Post procedure EKG shows: NSR with 1st Deg AVB & PACs Complications: None Patient did tolerate procedure well.  Plan: D/c once awake & stable post anesthesia - f/u A SEHVC as scheduled  Time Spent Directly with the Patient:  15 minutes   HARDING,DAVID W, M.D., M.S. [facility] [address] [place], [place]  [address]  [phone]  04/03/2011 12:16 PM

## 2011-04-03 NOTE — Anesthesia Postprocedure Evaluation (Signed)
Anesthesia Post Note  Patient: Ronald [name] [facility]  Procedure(s) Performed:  CARDIOVERSION  Anesthesia type: general  Patient location: PACU  Post pain: Pain level controlled  Post assessment: Patient's Cardiovascular Status Stable  Last Vitals:  Filed Vitals:   04/03/11 1315  BP: 163/76  Pulse: 69  Temp:   Resp: 19    Post vital signs: Reviewed and stable  Level of consciousness: sedated  Complications: No apparent anesthesia complications

## 2011-04-03 NOTE — Transfer of Care (Signed)
Immediate Anesthesia Transfer of Care Note  Patient: Ronald [name] [facility]  Procedure(s) Performed:  CARDIOVERSION  Patient Location: Short Stay  Anesthesia Type: MAC  Level of Consciousness: awake  Airway & Oxygen Therapy: Patient Spontanous Breathing and Patient connected to nasal cannula oxygen  Post-op Assessment: Report given to PACU RN, Post -op Vital signs reviewed and stable and Patient moving all extremities X 4  Post vital signs: Reviewed and stable  Complications: No apparent anesthesia complications

## 2011-04-03 NOTE — H&P (Addendum)
History and Physical Interval Note:  NAME:  LAUTARO KORAL   MRN: [id number] DOB:  Jun 18, 1922   ADMIT DATE: 04/03/2011   04/03/2011 11:59 AM  Rueben Bash Wisenbaker is a 78 y.o. male with a history of Atrial Fibrillation and Mildly reduced EF with moderate AS (s/p AVR) & minimal CAD but + PVOD - on Warfarin for ~1 month.  He has presented today for DCCV of Atrial Fibrillation for symptomatic Atrial Fibrillation.   The various methods of treatment have been discussed with the patient and family. After consideration of risks, benefits and other options for treatment, the patient has consented to Procedure(s):  DIRECT CURRENT SYNCHRONIZED CARDIOVERSION  as a surgical intervention.   The patients' history has been reviewed, patient examined, no change in status from most recent note dated 03/29/2011, stable for surgery. I have reviewed the patients' chart and labs. Questions were answered to the patient's satisfaction.    Ellesse Antenucci W [facility] [address] [place], [place]  [address]  [phone]  04/03/2011 11:59 AM

## 2011-04-03 NOTE — Preoperative (Signed)
Beta Blockers   Reason not to administer Beta Blockers:Not Applicable 

## 2011-04-04 ENCOUNTER — Ambulatory Visit (INDEPENDENT_AMBULATORY_CARE_PROVIDER_SITE_OTHER): Payer: Self-pay | Admitting: Cardiothoracic Surgery

## 2011-04-04 ENCOUNTER — Encounter: Payer: Self-pay | Admitting: Cardiothoracic Surgery

## 2011-04-04 VITALS — BP 152/68 | HR 76 | Resp 16 | Ht 71.0 in | Wt 182.0 lb

## 2011-04-04 DIAGNOSIS — I359 Nonrheumatic aortic valve disorder, unspecified: Secondary | ICD-10-CM

## 2011-04-04 DIAGNOSIS — Z09 Encounter for follow-up examination after completed treatment for conditions other than malignant neoplasm: Secondary | ICD-10-CM

## 2011-04-04 NOTE — Progress Notes (Signed)
[address]            [place] [address]          [phone]        HAYDIN CALANDRA Pritchett Medical Record #[id number] Date of Birth: 21-Dec-1922  [name], MD [name], MD, MD  Chief Complaint:   PostOp Follow Up Visit 01/07/2011  PREOPERATIVE DIAGNOSIS: Critical aortic stenosis, atrial fibrillation.  POSTOPERATIVE DIAGNOSIS: Critical aortic stenosis, atrial fibrillation.  SURGICAL PROCEDURE: Aortic valve replacement with a 23 mm pericardial  tissue valve, [employer] model 3300 TFX, serial [id number] and  placement of left atrial clip.    History of Present Illness:     Patient returns for followup visit after his aortic valve replacement and placement of left atrial clip. Recently while in cardiac rehabilitation he had been recurrent episode of atrial fibrillation. He seen Dr. Allyson Sabal and has been started on Cardizem and Coumadin with the plan for cardioversion in several weeks. The patient had cardioversion done 2 days ago. He's gaining strength, he had been going to cardiac rehabilitation until the development of atrial fibrillation now that he is back in sinus he'll resume cardiac rehabilitation next week. He plans to go to the [place] race February 15.  History  Smoking status  . Current Everyday Smoker -- 2.0 packs/day  . Types: Cigarettes  Smokeless tobacco  . Not on file       Allergies  Allergen Reactions  . Aspirin Shortness Of Breath and Rash    Current Outpatient Prescriptions  Medication Sig Dispense Refill  . benazepril (LOTENSIN) 10 MG tablet Take 10 mg by mouth daily.      . clopidogrel (PLAVIX) 75 MG tablet Take 1 tablet (75 mg total) by mouth daily.      Marland Kitchen diltiazem (CARDIZEM CD) 180 MG 24 hr capsule Take 180 mg by mouth daily.      Marland Kitchen doxazosin (CARDURA) 4 MG tablet Take 4 mg by mouth at bedtime.      . finasteride (PROSCAR) 5 MG tablet Take 5 mg by mouth daily.        .  Fluticasone-Salmeterol (ADVAIR) 250-50 MCG/DOSE AEPB Inhale 1 puff into the lungs 2 (two) times daily.  28 each  0  . furosemide (LASIX) 20 MG tablet Take 20 mg by mouth daily.        Marland Kitchen glyBURIDE (DIABETA) 5 MG tablet Take 5 mg by mouth 1 day or 1 dose.       . insulin glargine (LANTUS) 100 UNIT/ML injection Inject 10 Units into the skin at bedtime.       Marland Kitchen loratadine (CLARITIN) 10 MG tablet Take 1 tablet (10 mg total) by mouth daily.      . meclizine (ANTIVERT) 12.5 MG tablet Take 12.5 mg by mouth 3 (three) times daily. FOR 10 DAYS      . metoprolol (LOPRESSOR) 50 MG tablet Take 50 mg by mouth 2 (two) times daily.      . potassium chloride (KLOR-CON) 10 MEQ CR tablet Take 20 mEq by mouth daily.        . simvastatin (ZOCOR) 20 MG tablet Take 20 mg by mouth at bedtime.        Marland Kitchen warfarin (COUMADIN) 5 MG tablet Take 2.5 mg by mouth daily.       Marland Kitchen acetaminophen (TYLENOL) 500 MG tablet Take 1,000  mg by mouth every 6 (six) hours as needed. For pain       No current facility-administered medications for this visit.   Facility-Administered Medications Ordered in Other Visits  Medication Dose Route Frequency Provider Last Rate Last Dose  . DISCONTD: 0.45 % sodium chloride infusion   Intravenous Continuous Marykay Lex, MD      . DISCONTD: hydrocortisone cream 1 % 1 application  1 application Topical TID PRN Marykay Lex, MD      . DISCONTD: propofol (DIPRIVAN) 10 mg/mL bolus    PRN Carmela Rima, CRNA   70 mg at 04/03/11 1206  . DISCONTD: sodium chloride 0.9 % injection 3 mL  3 mL Intravenous PRN Marykay Lex, MD           Physical Exam: BP 152/68  Pulse 76  Resp 16  Ht 5\' 11"  (1.803 m)  Wt 182 lb (82.555 kg)  BMI 25.38 kg/m2  SpO2 95%  General appearance: alert and cooperative Neurologic: intact Heart: Heart rhythm is regular, there is no murmur of aortic insufficiency Lungs: clear to auscultation bilaterally Abdomen: soft, non-tender; bowel sounds normal; no masses,  no  organomegaly Extremities: extremities normal, atraumatic, no cyanosis or edema Wound: sternum stable Patient has no murmur of aortic insufficiency   Diagnostic Studies & Laboratory data:         Recent Radiology Findings:  Recent Labs: Lab Results  Component Value Date   WBC 14.9* 01/17/2011   HGB 9.6* 01/17/2011   HCT 28.5* 01/17/2011   PLT 219 01/17/2011   GLUCOSE 67* 01/17/2011   ALT 57* 01/17/2011   AST 30 01/17/2011   NA 142 01/17/2011   K 3.5 01/17/2011   CL 104 01/17/2011   CREATININE 1.13 01/17/2011   BUN 32* 01/17/2011   CO2 27 01/17/2011   INR 2.28* 04/03/2011   HGBA1C 8.0* 01/03/2011      Assessment / Plan:      Patient now status post aortic valve replacement. He had an atrial clip placed at the time of his aortic valve replacement. Maintaining sinus rhythm since cardioversion earlier this week. Wounds are all well-healed He will resume cardiac rehabilitation next week I plan to see him back in 3 months   [name] 04/04/2011 11:30 AM

## 2011-04-05 ENCOUNTER — Telehealth (HOSPITAL_COMMUNITY): Payer: Self-pay | Admitting: *Deleted

## 2011-04-05 ENCOUNTER — Encounter (HOSPITAL_COMMUNITY): Admission: RE | Admit: 2011-04-05 | Payer: Medicare Other | Source: Ambulatory Visit

## 2011-04-08 ENCOUNTER — Encounter (HOSPITAL_COMMUNITY)
Admission: RE | Admit: 2011-04-08 | Discharge: 2011-04-08 | Disposition: A | Discharge status: Home / Self Care | Payer: Medicare Other | Source: Ambulatory Visit | Attending: Cardiovascular Disease | Admitting: Cardiovascular Disease

## 2011-04-08 LAB — GLUCOSE, CAPILLARY: Glucose-Capillary: 246 mg/dL — ABNORMAL HIGH (ref 70–99)

## 2011-04-08 NOTE — Progress Notes (Signed)
Jovon returned to exercise at cardiac rehab today.  Telemetry rhythm Irregular possible Atrial fibrillation rate controlled 80's . Faxed ECG tracing's to Dr Hazle Coca office for review.  Blood pressure 140/60.  Dr Allyson Sabal reviewed ECG tracing confirmed the rhythm is atrial fibrillation.  Dr Allyson Sabal said that  Ronald [name] may exercise.  Sayed exercised in the 11:15 class today.

## 2011-04-10 ENCOUNTER — Encounter (HOSPITAL_COMMUNITY)
Admission: RE | Admit: 2011-04-10 | Discharge: 2011-04-10 | Disposition: A | Discharge status: Home / Self Care | Payer: Medicare Other | Source: Ambulatory Visit | Attending: Cardiovascular Disease | Admitting: Cardiovascular Disease

## 2011-04-10 DIAGNOSIS — I4891 Unspecified atrial fibrillation: Secondary | ICD-10-CM | POA: Diagnosis not present

## 2011-04-10 DIAGNOSIS — Z7901 Long term (current) use of anticoagulants: Secondary | ICD-10-CM | POA: Diagnosis not present

## 2011-04-10 LAB — GLUCOSE, CAPILLARY: Glucose-Capillary: 190 mg/dL — ABNORMAL HIGH (ref 70–99)

## 2011-04-12 ENCOUNTER — Encounter (HOSPITAL_COMMUNITY)
Admission: RE | Admit: 2011-04-12 | Discharge: 2011-04-12 | Disposition: A | Discharge status: Home / Self Care | Payer: Medicare Other | Source: Ambulatory Visit | Attending: Cardiovascular Disease | Admitting: Cardiovascular Disease

## 2011-04-15 ENCOUNTER — Encounter (HOSPITAL_COMMUNITY)
Admission: RE | Admit: 2011-04-15 | Discharge: 2011-04-15 | Disposition: A | Discharge status: Home / Self Care | Payer: Medicare Other | Source: Ambulatory Visit | Attending: Cardiovascular Disease | Admitting: Cardiovascular Disease

## 2011-04-17 ENCOUNTER — Encounter (HOSPITAL_COMMUNITY): Payer: Medicare Other

## 2011-04-17 NOTE — Progress Notes (Signed)
Ronald [name] will be out the rest of this week and part of next week going to [facility].

## 2011-04-19 ENCOUNTER — Encounter (HOSPITAL_COMMUNITY): Payer: Medicare Other

## 2011-04-22 ENCOUNTER — Encounter (HOSPITAL_COMMUNITY): Payer: Medicare Other

## 2011-04-24 ENCOUNTER — Encounter (HOSPITAL_COMMUNITY)
Admission: RE | Admit: 2011-04-24 | Discharge: 2011-04-24 | Disposition: A | Discharge status: Home / Self Care | Payer: Medicare Other | Source: Ambulatory Visit | Attending: Cardiovascular Disease | Admitting: Cardiovascular Disease

## 2011-04-24 LAB — GLUCOSE, CAPILLARY: Glucose-Capillary: 243 mg/dL — ABNORMAL HIGH (ref 70–99)

## 2011-04-24 NOTE — Progress Notes (Signed)
[name] returned to exercise today.  Blood pressure slightly elevated initially.  No complaints, will continue to monitor.

## 2011-04-26 ENCOUNTER — Encounter (HOSPITAL_COMMUNITY)
Admission: RE | Admit: 2011-04-26 | Discharge: 2011-04-26 | Disposition: A | Discharge status: Home / Self Care | Payer: Medicare Other | Source: Ambulatory Visit | Attending: Cardiovascular Disease | Admitting: Cardiovascular Disease

## 2011-04-26 DIAGNOSIS — I129 Hypertensive chronic kidney disease with stage 1 through stage 4 chronic kidney disease, or unspecified chronic kidney disease: Secondary | ICD-10-CM | POA: Insufficient documentation

## 2011-04-26 DIAGNOSIS — F3289 Other specified depressive episodes: Secondary | ICD-10-CM | POA: Insufficient documentation

## 2011-04-26 DIAGNOSIS — E119 Type 2 diabetes mellitus without complications: Secondary | ICD-10-CM | POA: Insufficient documentation

## 2011-04-26 DIAGNOSIS — F17200 Nicotine dependence, unspecified, uncomplicated: Secondary | ICD-10-CM | POA: Diagnosis not present

## 2011-04-26 DIAGNOSIS — Z8551 Personal history of malignant neoplasm of bladder: Secondary | ICD-10-CM | POA: Insufficient documentation

## 2011-04-26 DIAGNOSIS — I4891 Unspecified atrial fibrillation: Secondary | ICD-10-CM | POA: Diagnosis not present

## 2011-04-26 DIAGNOSIS — Z9889 Other specified postprocedural states: Secondary | ICD-10-CM | POA: Insufficient documentation

## 2011-04-26 DIAGNOSIS — I6529 Occlusion and stenosis of unspecified carotid artery: Secondary | ICD-10-CM | POA: Insufficient documentation

## 2011-04-26 DIAGNOSIS — Z85828 Personal history of other malignant neoplasm of skin: Secondary | ICD-10-CM | POA: Diagnosis not present

## 2011-04-26 DIAGNOSIS — E785 Hyperlipidemia, unspecified: Secondary | ICD-10-CM | POA: Diagnosis not present

## 2011-04-26 DIAGNOSIS — N189 Chronic kidney disease, unspecified: Secondary | ICD-10-CM | POA: Diagnosis not present

## 2011-04-26 DIAGNOSIS — I872 Venous insufficiency (chronic) (peripheral): Secondary | ICD-10-CM | POA: Insufficient documentation

## 2011-04-26 DIAGNOSIS — Z7902 Long term (current) use of antithrombotics/antiplatelets: Secondary | ICD-10-CM | POA: Insufficient documentation

## 2011-04-26 DIAGNOSIS — I798 Other disorders of arteries, arterioles and capillaries in diseases classified elsewhere: Secondary | ICD-10-CM | POA: Insufficient documentation

## 2011-04-26 DIAGNOSIS — I658 Occlusion and stenosis of other precerebral arteries: Secondary | ICD-10-CM | POA: Insufficient documentation

## 2011-04-26 DIAGNOSIS — I739 Peripheral vascular disease, unspecified: Secondary | ICD-10-CM | POA: Insufficient documentation

## 2011-04-26 DIAGNOSIS — G2581 Restless legs syndrome: Secondary | ICD-10-CM | POA: Insufficient documentation

## 2011-04-26 DIAGNOSIS — F329 Major depressive disorder, single episode, unspecified: Secondary | ICD-10-CM | POA: Diagnosis not present

## 2011-04-26 DIAGNOSIS — I359 Nonrheumatic aortic valve disorder, unspecified: Secondary | ICD-10-CM | POA: Diagnosis not present

## 2011-04-26 DIAGNOSIS — Z5189 Encounter for other specified aftercare: Secondary | ICD-10-CM | POA: Insufficient documentation

## 2011-04-29 ENCOUNTER — Encounter (HOSPITAL_COMMUNITY)
Admission: RE | Admit: 2011-04-29 | Discharge: 2011-04-29 | Disposition: A | Discharge status: Home / Self Care | Payer: Medicare Other | Source: Ambulatory Visit | Attending: Cardiovascular Disease | Admitting: Cardiovascular Disease

## 2011-04-29 NOTE — Progress Notes (Signed)
Ronald [name] 78 y.o. male Nutrition Note Spoke with pt.  Nutrition Plan and Nutrition Survey reviewed with pt. Pt is following Step 1 of the Therapeutic Lifestyle Changes diet. Pt reports he gained 10 lbs during his recent trip to [place].  Pt wants to achieve a goal wt of "175 or less." Pt wt today 188 lb, which is down from his highest wt of 192 lb according to pt. Weight loss tips discussed. Pt is diabetic.  Pt checks his CBG's before breakfast and before dinner. Pt slowly weaned himself off of Lantus and has remained off Lantus for the past 5 days.  Pt reports his CBG's have ranged from 126-145 mg/dL in the morning. Pt fasting CBG's appear reasonable.  Pt has continued taking his Glyburide. Last A1c slightly higher than desired given pt age. Pt to see his PCP that manages his DM Wed 3/6 and discuss his discontinuing Lantus with his MD. This Clinical research associate went over Diabetes Education test results. Pt expressed understanding. Pt taking Coumadin.  Pt aware of high vitamin K foods and the need for consistent vitamin K intake.  Nutrition Diagnosis   Food-and nutrition-related knowledge deficit related to lack of exposure to information as related to diagnosis of: ? CVD ? DM (A1c 8.0)   Overweight related to excessive energy intake as evidenced by a BMI of 25.9  Nutrition RX/ Estimated Daily Nutrition Needs for: wt loss 1300-1800 Kcal , Total Fat 70-80gm, Saturated Fat 16-19 gm, Trans Fat 2.3-2.7 gm, Sodium less than 1500 mg, Grams of CHO 175-250   Nutrition Intervention   Pt's individual nutrition plan including cholesterol goals reviewed with pt.   Benefits of adopting Therapeutic Lifestyle Changes discussed when Medficts reviewed.   Pt to attend the Portion Distortion class   Pt to attend the  ? Nutrition I class                     ? Nutrition II class        ? Diabetes Blitz class       ? Diabetes Q & A class   Pt given handouts for: ? wt loss ? DM ? Consistent vit K diet    Continue client-centered  nutrition education by RD, as part of interdisciplinary care. Goal(s)   Pt to identify food quantities necessary to achieve: ? wt loss to a goal wt of 170-175 lb (77.3-79.5 kg) at graduation from cardiac rehab.    Use pre-meal and post-meal CBG's and A1c to determine whether adjustments in food/meal planning will be beneficial or if any meds need to be combined with nutrition therapy. Monitor and Evaluate progress toward nutrition goal with team.

## 2011-05-01 ENCOUNTER — Encounter (HOSPITAL_COMMUNITY)
Admission: RE | Admit: 2011-05-01 | Discharge: 2011-05-01 | Disposition: A | Discharge status: Home / Self Care | Payer: Medicare Other | Source: Ambulatory Visit | Attending: Cardiovascular Disease | Admitting: Cardiovascular Disease

## 2011-05-01 ENCOUNTER — Telehealth (HOSPITAL_COMMUNITY): Payer: Self-pay | Admitting: *Deleted

## 2011-05-01 DIAGNOSIS — I4891 Unspecified atrial fibrillation: Secondary | ICD-10-CM | POA: Diagnosis not present

## 2011-05-01 DIAGNOSIS — I10 Essential (primary) hypertension: Secondary | ICD-10-CM | POA: Diagnosis not present

## 2011-05-01 DIAGNOSIS — J329 Chronic sinusitis, unspecified: Secondary | ICD-10-CM | POA: Diagnosis not present

## 2011-05-01 DIAGNOSIS — E1159 Type 2 diabetes mellitus with other circulatory complications: Secondary | ICD-10-CM | POA: Diagnosis not present

## 2011-05-01 LAB — GLUCOSE, CAPILLARY: Glucose-Capillary: 218 mg/dL — ABNORMAL HIGH (ref 70–99)

## 2011-05-01 NOTE — Progress Notes (Signed)
Mackenzy came in late to exercise this am.  Heart rate low 100's to 120 Chronic Atrial fib. Patient reports having cold symptoms for the past 2 weeks. Temperature 98.2  Oxygen saturation 99% on room air.  Elliott exercise today with reduced work loads without complaints.  Elizar has an appointment with Dr Timothy Lasso today at 11:45 am. Faxed today's exercise flow sheets to Dr Ferd Hibbs office for review.

## 2011-05-03 ENCOUNTER — Encounter (HOSPITAL_COMMUNITY): Payer: Medicare Other

## 2011-05-03 DIAGNOSIS — E119 Type 2 diabetes mellitus without complications: Secondary | ICD-10-CM | POA: Diagnosis not present

## 2011-05-03 DIAGNOSIS — I4891 Unspecified atrial fibrillation: Secondary | ICD-10-CM | POA: Diagnosis not present

## 2011-05-03 DIAGNOSIS — I061 Rheumatic aortic insufficiency: Secondary | ICD-10-CM | POA: Diagnosis not present

## 2011-05-03 DIAGNOSIS — Z7901 Long term (current) use of anticoagulants: Secondary | ICD-10-CM | POA: Diagnosis not present

## 2011-05-06 ENCOUNTER — Encounter (HOSPITAL_COMMUNITY)
Admission: RE | Admit: 2011-05-06 | Discharge: 2011-05-06 | Disposition: A | Discharge status: Home / Self Care | Payer: Medicare Other | Source: Ambulatory Visit | Attending: Cardiovascular Disease | Admitting: Cardiovascular Disease

## 2011-05-07 NOTE — Progress Notes (Signed)
Ronald [name]  Gained 2.2 kg from last exercise session on 05/01/11.  Upon assessment lung fields clear upon auscultation but coarse right posterior field. Positive lower extremity edema. Ronald [name] is wearing suppression stockings.  Ronald [name] says his brother has been in town the past two weeks and he has been eating more than he usually does.  Faxed information to Dr Hazle Coca office. Will continue to monitor.

## 2011-05-08 ENCOUNTER — Encounter (HOSPITAL_COMMUNITY)
Admission: RE | Admit: 2011-05-08 | Discharge: 2011-05-08 | Disposition: A | Discharge status: Home / Self Care | Payer: Medicare Other | Source: Ambulatory Visit | Attending: Cardiovascular Disease | Admitting: Cardiovascular Disease

## 2011-05-10 ENCOUNTER — Encounter (HOSPITAL_COMMUNITY)
Admission: RE | Admit: 2011-05-10 | Discharge: 2011-05-10 | Disposition: A | Discharge status: Home / Self Care | Payer: Medicare Other | Source: Ambulatory Visit | Attending: Cardiovascular Disease | Admitting: Cardiovascular Disease

## 2011-05-13 ENCOUNTER — Encounter (HOSPITAL_COMMUNITY)
Admission: RE | Admit: 2011-05-13 | Discharge: 2011-05-13 | Disposition: A | Discharge status: Home / Self Care | Payer: Medicare Other | Source: Ambulatory Visit | Attending: Cardiovascular Disease | Admitting: Cardiovascular Disease

## 2011-05-15 ENCOUNTER — Encounter (HOSPITAL_COMMUNITY)
Admission: RE | Admit: 2011-05-15 | Discharge: 2011-05-15 | Disposition: A | Discharge status: Home / Self Care | Payer: Medicare Other | Source: Ambulatory Visit | Attending: Cardiovascular Disease | Admitting: Cardiovascular Disease

## 2011-05-15 LAB — GLUCOSE, CAPILLARY: Glucose-Capillary: 259 mg/dL — ABNORMAL HIGH (ref 70–99)

## 2011-05-16 DIAGNOSIS — N138 Other obstructive and reflux uropathy: Secondary | ICD-10-CM | POA: Diagnosis not present

## 2011-05-16 DIAGNOSIS — D090 Carcinoma in situ of bladder: Secondary | ICD-10-CM | POA: Diagnosis not present

## 2011-05-16 DIAGNOSIS — N401 Enlarged prostate with lower urinary tract symptoms: Secondary | ICD-10-CM | POA: Diagnosis not present

## 2011-05-17 ENCOUNTER — Encounter (HOSPITAL_COMMUNITY): Payer: Medicare Other

## 2011-05-17 DIAGNOSIS — H00039 Abscess of eyelid unspecified eye, unspecified eyelid: Secondary | ICD-10-CM | POA: Diagnosis not present

## 2011-05-20 ENCOUNTER — Encounter (HOSPITAL_COMMUNITY)
Admission: RE | Admit: 2011-05-20 | Discharge: 2011-05-20 | Disposition: A | Discharge status: Home / Self Care | Payer: Medicare Other | Source: Ambulatory Visit | Attending: Cardiovascular Disease | Admitting: Cardiovascular Disease

## 2011-05-22 ENCOUNTER — Encounter (HOSPITAL_COMMUNITY)
Admission: RE | Admit: 2011-05-22 | Discharge: 2011-05-22 | Disposition: A | Discharge status: Home / Self Care | Payer: Medicare Other | Source: Ambulatory Visit | Attending: Cardiovascular Disease | Admitting: Cardiovascular Disease

## 2011-05-24 ENCOUNTER — Encounter (HOSPITAL_COMMUNITY): Payer: Medicare Other

## 2011-05-27 ENCOUNTER — Encounter (HOSPITAL_COMMUNITY)
Admission: RE | Admit: 2011-05-27 | Discharge: 2011-05-27 | Disposition: A | Discharge status: Home / Self Care | Payer: Medicare Other | Source: Ambulatory Visit | Attending: Cardiovascular Disease | Admitting: Cardiovascular Disease

## 2011-05-27 DIAGNOSIS — E785 Hyperlipidemia, unspecified: Secondary | ICD-10-CM | POA: Insufficient documentation

## 2011-05-27 DIAGNOSIS — I4891 Unspecified atrial fibrillation: Secondary | ICD-10-CM | POA: Diagnosis not present

## 2011-05-27 DIAGNOSIS — I798 Other disorders of arteries, arterioles and capillaries in diseases classified elsewhere: Secondary | ICD-10-CM | POA: Diagnosis not present

## 2011-05-27 DIAGNOSIS — E119 Type 2 diabetes mellitus without complications: Secondary | ICD-10-CM | POA: Insufficient documentation

## 2011-05-27 DIAGNOSIS — I129 Hypertensive chronic kidney disease with stage 1 through stage 4 chronic kidney disease, or unspecified chronic kidney disease: Secondary | ICD-10-CM | POA: Insufficient documentation

## 2011-05-27 DIAGNOSIS — Z9889 Other specified postprocedural states: Secondary | ICD-10-CM | POA: Insufficient documentation

## 2011-05-27 DIAGNOSIS — N189 Chronic kidney disease, unspecified: Secondary | ICD-10-CM | POA: Insufficient documentation

## 2011-05-27 DIAGNOSIS — G2581 Restless legs syndrome: Secondary | ICD-10-CM | POA: Diagnosis not present

## 2011-05-27 DIAGNOSIS — I359 Nonrheumatic aortic valve disorder, unspecified: Secondary | ICD-10-CM | POA: Diagnosis not present

## 2011-05-27 DIAGNOSIS — I872 Venous insufficiency (chronic) (peripheral): Secondary | ICD-10-CM | POA: Insufficient documentation

## 2011-05-27 DIAGNOSIS — Z5189 Encounter for other specified aftercare: Secondary | ICD-10-CM | POA: Diagnosis not present

## 2011-05-27 DIAGNOSIS — F329 Major depressive disorder, single episode, unspecified: Secondary | ICD-10-CM | POA: Insufficient documentation

## 2011-05-27 DIAGNOSIS — F3289 Other specified depressive episodes: Secondary | ICD-10-CM | POA: Insufficient documentation

## 2011-05-27 DIAGNOSIS — I658 Occlusion and stenosis of other precerebral arteries: Secondary | ICD-10-CM | POA: Diagnosis not present

## 2011-05-27 DIAGNOSIS — I6529 Occlusion and stenosis of unspecified carotid artery: Secondary | ICD-10-CM | POA: Insufficient documentation

## 2011-05-27 DIAGNOSIS — I739 Peripheral vascular disease, unspecified: Secondary | ICD-10-CM | POA: Diagnosis not present

## 2011-05-27 DIAGNOSIS — Z8551 Personal history of malignant neoplasm of bladder: Secondary | ICD-10-CM | POA: Insufficient documentation

## 2011-05-27 DIAGNOSIS — Z85828 Personal history of other malignant neoplasm of skin: Secondary | ICD-10-CM | POA: Diagnosis not present

## 2011-05-27 DIAGNOSIS — Z7902 Long term (current) use of antithrombotics/antiplatelets: Secondary | ICD-10-CM | POA: Insufficient documentation

## 2011-05-27 DIAGNOSIS — F17200 Nicotine dependence, unspecified, uncomplicated: Secondary | ICD-10-CM | POA: Insufficient documentation

## 2011-05-28 DIAGNOSIS — E782 Mixed hyperlipidemia: Secondary | ICD-10-CM | POA: Diagnosis not present

## 2011-05-28 DIAGNOSIS — I4891 Unspecified atrial fibrillation: Secondary | ICD-10-CM | POA: Diagnosis not present

## 2011-05-28 DIAGNOSIS — I061 Rheumatic aortic insufficiency: Secondary | ICD-10-CM | POA: Diagnosis not present

## 2011-05-28 DIAGNOSIS — E119 Type 2 diabetes mellitus without complications: Secondary | ICD-10-CM | POA: Diagnosis not present

## 2011-05-29 ENCOUNTER — Encounter (HOSPITAL_COMMUNITY): Payer: Medicare Other

## 2011-05-31 ENCOUNTER — Encounter (HOSPITAL_COMMUNITY)
Admission: RE | Admit: 2011-05-31 | Discharge: 2011-05-31 | Disposition: A | Discharge status: Home / Self Care | Payer: Medicare Other | Source: Ambulatory Visit | Attending: Cardiovascular Disease | Admitting: Cardiovascular Disease

## 2011-05-31 DIAGNOSIS — I658 Occlusion and stenosis of other precerebral arteries: Secondary | ICD-10-CM | POA: Diagnosis not present

## 2011-05-31 DIAGNOSIS — I4891 Unspecified atrial fibrillation: Secondary | ICD-10-CM | POA: Diagnosis not present

## 2011-05-31 DIAGNOSIS — I359 Nonrheumatic aortic valve disorder, unspecified: Secondary | ICD-10-CM | POA: Diagnosis not present

## 2011-05-31 DIAGNOSIS — Z9889 Other specified postprocedural states: Secondary | ICD-10-CM | POA: Diagnosis not present

## 2011-05-31 DIAGNOSIS — I6529 Occlusion and stenosis of unspecified carotid artery: Secondary | ICD-10-CM | POA: Diagnosis not present

## 2011-05-31 DIAGNOSIS — Z5189 Encounter for other specified aftercare: Secondary | ICD-10-CM | POA: Diagnosis not present

## 2011-06-03 ENCOUNTER — Encounter (HOSPITAL_COMMUNITY)
Admission: RE | Admit: 2011-06-03 | Discharge: 2011-06-03 | Disposition: A | Discharge status: Home / Self Care | Payer: Medicare Other | Source: Ambulatory Visit | Attending: Cardiovascular Disease | Admitting: Cardiovascular Disease

## 2011-06-03 DIAGNOSIS — Z5189 Encounter for other specified aftercare: Secondary | ICD-10-CM | POA: Diagnosis not present

## 2011-06-03 DIAGNOSIS — I658 Occlusion and stenosis of other precerebral arteries: Secondary | ICD-10-CM | POA: Diagnosis not present

## 2011-06-03 DIAGNOSIS — Z9889 Other specified postprocedural states: Secondary | ICD-10-CM | POA: Diagnosis not present

## 2011-06-03 DIAGNOSIS — I359 Nonrheumatic aortic valve disorder, unspecified: Secondary | ICD-10-CM | POA: Diagnosis not present

## 2011-06-03 DIAGNOSIS — I6529 Occlusion and stenosis of unspecified carotid artery: Secondary | ICD-10-CM | POA: Diagnosis not present

## 2011-06-03 DIAGNOSIS — I4891 Unspecified atrial fibrillation: Secondary | ICD-10-CM | POA: Diagnosis not present

## 2011-06-05 ENCOUNTER — Encounter (HOSPITAL_COMMUNITY): Payer: Medicare Other

## 2011-06-05 ENCOUNTER — Encounter (HOSPITAL_COMMUNITY): Payer: Self-pay | Admitting: Respiratory Therapy

## 2011-06-05 DIAGNOSIS — I2510 Atherosclerotic heart disease of native coronary artery without angina pectoris: Secondary | ICD-10-CM | POA: Diagnosis not present

## 2011-06-05 DIAGNOSIS — I701 Atherosclerosis of renal artery: Secondary | ICD-10-CM | POA: Diagnosis not present

## 2011-06-05 DIAGNOSIS — E1159 Type 2 diabetes mellitus with other circulatory complications: Secondary | ICD-10-CM | POA: Diagnosis not present

## 2011-06-05 DIAGNOSIS — E1149 Type 2 diabetes mellitus with other diabetic neurological complication: Secondary | ICD-10-CM | POA: Diagnosis not present

## 2011-06-05 DIAGNOSIS — I4891 Unspecified atrial fibrillation: Secondary | ICD-10-CM | POA: Diagnosis not present

## 2011-06-05 DIAGNOSIS — I10 Essential (primary) hypertension: Secondary | ICD-10-CM | POA: Diagnosis not present

## 2011-06-07 ENCOUNTER — Encounter (HOSPITAL_COMMUNITY)
Admission: RE | Admit: 2011-06-07 | Discharge: 2011-06-07 | Disposition: A | Discharge status: Home / Self Care | Payer: Medicare Other | Source: Ambulatory Visit | Attending: Cardiovascular Disease | Admitting: Cardiovascular Disease

## 2011-06-07 DIAGNOSIS — I6529 Occlusion and stenosis of unspecified carotid artery: Secondary | ICD-10-CM | POA: Diagnosis not present

## 2011-06-07 DIAGNOSIS — I658 Occlusion and stenosis of other precerebral arteries: Secondary | ICD-10-CM | POA: Diagnosis not present

## 2011-06-07 DIAGNOSIS — I359 Nonrheumatic aortic valve disorder, unspecified: Secondary | ICD-10-CM | POA: Diagnosis not present

## 2011-06-07 DIAGNOSIS — I4891 Unspecified atrial fibrillation: Secondary | ICD-10-CM | POA: Diagnosis not present

## 2011-06-07 DIAGNOSIS — Z9889 Other specified postprocedural states: Secondary | ICD-10-CM | POA: Diagnosis not present

## 2011-06-07 DIAGNOSIS — Z5189 Encounter for other specified aftercare: Secondary | ICD-10-CM | POA: Diagnosis not present

## 2011-06-10 ENCOUNTER — Encounter (HOSPITAL_COMMUNITY)
Admission: RE | Admit: 2011-06-10 | Discharge: 2011-06-10 | Disposition: A | Discharge status: Home / Self Care | Payer: Medicare Other | Source: Ambulatory Visit | Attending: Cardiovascular Disease | Admitting: Cardiovascular Disease

## 2011-06-11 DIAGNOSIS — Z79899 Other long term (current) drug therapy: Secondary | ICD-10-CM | POA: Diagnosis not present

## 2011-06-11 DIAGNOSIS — D689 Coagulation defect, unspecified: Secondary | ICD-10-CM | POA: Diagnosis not present

## 2011-06-11 DIAGNOSIS — R5381 Other malaise: Secondary | ICD-10-CM | POA: Diagnosis not present

## 2011-06-12 ENCOUNTER — Other Ambulatory Visit: Payer: Self-pay | Admitting: Cardiovascular Disease

## 2011-06-12 ENCOUNTER — Encounter (HOSPITAL_COMMUNITY): Payer: Medicare Other

## 2011-06-12 ENCOUNTER — Encounter (HOSPITAL_COMMUNITY)
Admission: RE | Admit: 2011-06-12 | Discharge: 2011-06-12 | Disposition: A | Discharge status: Home / Self Care | Payer: Medicare Other | Source: Ambulatory Visit | Attending: Cardiovascular Disease | Admitting: Cardiovascular Disease

## 2011-06-12 DIAGNOSIS — Z7901 Long term (current) use of anticoagulants: Secondary | ICD-10-CM | POA: Diagnosis not present

## 2011-06-12 DIAGNOSIS — I4891 Unspecified atrial fibrillation: Secondary | ICD-10-CM | POA: Diagnosis not present

## 2011-06-12 DIAGNOSIS — Z9889 Other specified postprocedural states: Secondary | ICD-10-CM | POA: Diagnosis not present

## 2011-06-12 DIAGNOSIS — I359 Nonrheumatic aortic valve disorder, unspecified: Secondary | ICD-10-CM | POA: Diagnosis not present

## 2011-06-12 DIAGNOSIS — I658 Occlusion and stenosis of other precerebral arteries: Secondary | ICD-10-CM | POA: Diagnosis not present

## 2011-06-12 DIAGNOSIS — I6529 Occlusion and stenosis of unspecified carotid artery: Secondary | ICD-10-CM | POA: Diagnosis not present

## 2011-06-12 DIAGNOSIS — Z5189 Encounter for other specified aftercare: Secondary | ICD-10-CM | POA: Diagnosis not present

## 2011-06-14 ENCOUNTER — Encounter (HOSPITAL_COMMUNITY): Payer: Medicare Other

## 2011-06-17 ENCOUNTER — Inpatient Hospital Stay (HOSPITAL_COMMUNITY)
Admission: RE | Admit: 2011-06-17 | Discharge: 2011-06-19 | DRG: 254 | Disposition: A | Discharge status: Home / Self Care | Payer: Medicare Other | Source: Ambulatory Visit | Attending: Cardiovascular Disease | Admitting: Cardiovascular Disease

## 2011-06-17 ENCOUNTER — Encounter (HOSPITAL_COMMUNITY)
Admission: RE | Disposition: A | Discharge status: Home / Self Care | Payer: Self-pay | Source: Ambulatory Visit | Attending: Cardiovascular Disease

## 2011-06-17 ENCOUNTER — Encounter (HOSPITAL_COMMUNITY): Payer: Self-pay | Admitting: General Practice

## 2011-06-17 DIAGNOSIS — Z952 Presence of prosthetic heart valve: Secondary | ICD-10-CM

## 2011-06-17 DIAGNOSIS — I70219 Atherosclerosis of native arteries of extremities with intermittent claudication, unspecified extremity: Principal | ICD-10-CM | POA: Diagnosis present

## 2011-06-17 DIAGNOSIS — I739 Peripheral vascular disease, unspecified: Secondary | ICD-10-CM | POA: Diagnosis present

## 2011-06-17 DIAGNOSIS — Z7901 Long term (current) use of anticoagulants: Secondary | ICD-10-CM

## 2011-06-17 DIAGNOSIS — I482 Chronic atrial fibrillation, unspecified: Secondary | ICD-10-CM | POA: Diagnosis present

## 2011-06-17 DIAGNOSIS — S3012XA Contusion of groin, initial encounter: Secondary | ICD-10-CM | POA: Diagnosis not present

## 2011-06-17 DIAGNOSIS — F17200 Nicotine dependence, unspecified, uncomplicated: Secondary | ICD-10-CM | POA: Diagnosis present

## 2011-06-17 DIAGNOSIS — E785 Hyperlipidemia, unspecified: Secondary | ICD-10-CM | POA: Diagnosis present

## 2011-06-17 DIAGNOSIS — S301XXA Contusion of abdominal wall, initial encounter: Secondary | ICD-10-CM | POA: Diagnosis not present

## 2011-06-17 DIAGNOSIS — Z95820 Peripheral vascular angioplasty status with implants and grafts: Secondary | ICD-10-CM

## 2011-06-17 DIAGNOSIS — Z888 Allergy status to other drugs, medicaments and biological substances status: Secondary | ICD-10-CM | POA: Diagnosis present

## 2011-06-17 DIAGNOSIS — Z85828 Personal history of other malignant neoplasm of skin: Secondary | ICD-10-CM

## 2011-06-17 DIAGNOSIS — Z954 Presence of other heart-valve replacement: Secondary | ICD-10-CM

## 2011-06-17 DIAGNOSIS — I10 Essential (primary) hypertension: Secondary | ICD-10-CM | POA: Diagnosis present

## 2011-06-17 DIAGNOSIS — J4489 Other specified chronic obstructive pulmonary disease: Secondary | ICD-10-CM | POA: Diagnosis present

## 2011-06-17 DIAGNOSIS — E118 Type 2 diabetes mellitus with unspecified complications: Secondary | ICD-10-CM | POA: Diagnosis present

## 2011-06-17 DIAGNOSIS — J449 Chronic obstructive pulmonary disease, unspecified: Secondary | ICD-10-CM | POA: Diagnosis present

## 2011-06-17 DIAGNOSIS — E119 Type 2 diabetes mellitus without complications: Secondary | ICD-10-CM | POA: Diagnosis present

## 2011-06-17 HISTORY — DX: Shortness of breath: R06.02

## 2011-06-17 HISTORY — DX: Peripheral vascular disease, unspecified: I73.9

## 2011-06-17 HISTORY — DX: Chronic obstructive pulmonary disease, unspecified: J44.9

## 2011-06-17 HISTORY — PX: ABDOMINAL ANGIOGRAM: SHX5499

## 2011-06-17 HISTORY — DX: Peripheral vascular angioplasty status with implants and grafts: Z95.820

## 2011-06-17 HISTORY — PX: LOWER EXTREMITY ANGIOGRAM: SHX5955

## 2011-06-17 HISTORY — DX: Acute upper respiratory infection, unspecified: J06.9

## 2011-06-17 HISTORY — PX: LOWER EXTREMITY ANGIOGRAM: SHX5508

## 2011-06-17 HISTORY — PX: ATHERECTOMY: SHX5502

## 2011-06-17 LAB — PROTIME-INR
INR: 1.26 (ref 0.00–1.49)
Prothrombin Time: 16.1 seconds — ABNORMAL HIGH (ref 11.6–15.2)

## 2011-06-17 LAB — CBC
HCT: 34.9 % — ABNORMAL LOW (ref 39.0–52.0)
Hemoglobin: 11.8 g/dL — ABNORMAL LOW (ref 13.0–17.0)
MCHC: 33.8 g/dL (ref 30.0–36.0)
MCV: 88.8 fL (ref 78.0–100.0)
RDW: 16.5 % — ABNORMAL HIGH (ref 11.5–15.5)

## 2011-06-17 LAB — CREATININE, SERUM: GFR calc non Af Amer: 74 mL/min — ABNORMAL LOW (ref >90)

## 2011-06-17 LAB — GLUCOSE, CAPILLARY
Glucose-Capillary: 107 mg/dL — ABNORMAL HIGH (ref 70–99)
Glucose-Capillary: 282 mg/dL — ABNORMAL HIGH (ref 70–99)

## 2011-06-17 LAB — POCT ACTIVATED CLOTTING TIME: Activated Clotting Time: 155 seconds

## 2011-06-17 SURGERY — ATHERECTOMY
Anesthesia: LOCAL | Laterality: Right

## 2011-06-17 MED ORDER — SODIUM CHLORIDE 0.9 % IJ SOLN
3.0000 mL | Freq: Two times a day (BID) | INTRAMUSCULAR | Status: DC
  Administered 2011-06-17 – 2011-06-19 (×4): 3 mL via INTRAVENOUS

## 2011-06-17 MED ORDER — ONDANSETRON HCL 4 MG/2ML IJ SOLN: 4.0000 mg | Freq: Four times a day (QID) | INTRAMUSCULAR | Status: DC | PRN

## 2011-06-17 MED ORDER — SODIUM CHLORIDE 0.9 % IV SOLN: INTRAVENOUS | Status: AC

## 2011-06-17 MED ORDER — GLYBURIDE 5 MG PO TABS
10.0000 mg | ORAL_TABLET | Freq: Every day | ORAL | Status: DC
  Administered 2011-06-18 – 2011-06-19 (×2): 10 mg via ORAL
  Filled 2011-06-17 (×3): qty 2

## 2011-06-17 MED ORDER — SODIUM CHLORIDE 0.9 % IJ SOLN
3.0000 mL | INTRAMUSCULAR | Status: DC | PRN
  Administered 2011-06-17: 3 mL via INTRAVENOUS

## 2011-06-17 MED ORDER — FUROSEMIDE 20 MG PO TABS
20.0000 mg | ORAL_TABLET | Freq: Every day | ORAL | Status: DC
  Administered 2011-06-18 – 2011-06-19 (×2): 20 mg via ORAL
  Filled 2011-06-17 (×2): qty 1

## 2011-06-17 MED ORDER — MORPHINE SULFATE 2 MG/ML IJ SOLN: 2.0000 mg | INTRAMUSCULAR | Status: DC | PRN

## 2011-06-17 MED ORDER — LIDOCAINE HCL (PF) 1 % IJ SOLN
INTRAMUSCULAR | Status: AC
  Filled 2011-06-17: qty 30

## 2011-06-17 MED ORDER — HYDRALAZINE HCL 20 MG/ML IJ SOLN
10.0000 mg | Freq: Once | INTRAMUSCULAR | Status: AC
  Administered 2011-06-17: 10 mg via INTRAVENOUS
  Filled 2011-06-17: qty 0.5

## 2011-06-17 MED ORDER — LORATADINE 10 MG PO TABS
10.0000 mg | ORAL_TABLET | Freq: Every day | ORAL | Status: DC
  Administered 2011-06-18 – 2011-06-19 (×2): 10 mg via ORAL
  Filled 2011-06-17 (×2): qty 1

## 2011-06-17 MED ORDER — GLYBURIDE 5 MG PO TABS: 5.0000 mg | ORAL_TABLET | Freq: Two times a day (BID) | ORAL | Status: DC

## 2011-06-17 MED ORDER — CLOPIDOGREL BISULFATE 75 MG PO TABS
75.0000 mg | ORAL_TABLET | Freq: Every day | ORAL | Status: DC
Start: 2011-06-17 — End: 2011-06-19
  Administered 2011-06-17 – 2011-06-19 (×3): 75 mg via ORAL
  Filled 2011-06-17 (×4): qty 1

## 2011-06-17 MED ORDER — HEPARIN (PORCINE) IN NACL 2-0.9 UNIT/ML-% IJ SOLN
INTRAMUSCULAR | Status: AC
  Filled 2011-06-17: qty 1000

## 2011-06-17 MED ORDER — ACETAMINOPHEN 500 MG PO TABS
1000.0000 mg | ORAL_TABLET | Freq: Four times a day (QID) | ORAL | Status: DC | PRN
  Filled 2011-06-17: qty 2

## 2011-06-17 MED ORDER — FLUTICASONE-SALMETEROL 250-50 MCG/DOSE IN AEPB
1.0000 | INHALATION_SPRAY | Freq: Two times a day (BID) | RESPIRATORY_TRACT | Status: DC
  Administered 2011-06-17 – 2011-06-19 (×4): 1 via RESPIRATORY_TRACT
  Filled 2011-06-17: qty 14

## 2011-06-17 MED ORDER — FENTANYL CITRATE 0.05 MG/ML IJ SOLN
INTRAMUSCULAR | Status: AC
  Filled 2011-06-17: qty 2

## 2011-06-17 MED ORDER — MORPHINE SULFATE 2 MG/ML IJ SOLN
INTRAMUSCULAR | Status: AC
  Administered 2011-06-17: 2 mg via INTRAVENOUS
  Filled 2011-06-17: qty 1

## 2011-06-17 MED ORDER — METOPROLOL TARTRATE 50 MG PO TABS
50.0000 mg | ORAL_TABLET | Freq: Two times a day (BID) | ORAL | Status: DC
  Administered 2011-06-17 – 2011-06-19 (×4): 50 mg via ORAL
  Filled 2011-06-17 (×5): qty 1

## 2011-06-17 MED ORDER — BENAZEPRIL HCL 10 MG PO TABS
10.0000 mg | ORAL_TABLET | Freq: Every day | ORAL | Status: DC
  Administered 2011-06-18 – 2011-06-19 (×2): 10 mg via ORAL
  Filled 2011-06-17 (×2): qty 1

## 2011-06-17 MED ORDER — ACETAMINOPHEN 325 MG PO TABS: 650.0000 mg | ORAL_TABLET | ORAL | Status: DC | PRN

## 2011-06-17 MED ORDER — DIAZEPAM 5 MG PO TABS: 5.0000 mg | ORAL_TABLET | ORAL | Status: DC

## 2011-06-17 MED ORDER — DILTIAZEM HCL ER 240 MG PO CP24
240.0000 mg | ORAL_CAPSULE | Freq: Every day | ORAL | Status: DC
  Administered 2011-06-18 – 2011-06-19 (×2): 240 mg via ORAL
  Filled 2011-06-17 (×2): qty 1

## 2011-06-17 MED ORDER — MIDAZOLAM HCL 2 MG/2ML IJ SOLN
INTRAMUSCULAR | Status: AC
  Filled 2011-06-17: qty 2

## 2011-06-17 MED ORDER — DOXAZOSIN MESYLATE 4 MG PO TABS
4.0000 mg | ORAL_TABLET | Freq: Every day | ORAL | Status: DC
  Administered 2011-06-17 – 2011-06-18 (×2): 4 mg via ORAL
  Filled 2011-06-17 (×3): qty 1

## 2011-06-17 MED ORDER — HEPARIN SODIUM (PORCINE) 1000 UNIT/ML IJ SOLN
INTRAMUSCULAR | Status: AC
  Filled 2011-06-17: qty 1

## 2011-06-17 MED ORDER — ENOXAPARIN SODIUM 80 MG/0.8ML ~~LOC~~ SOLN
80.0000 mg | Freq: Two times a day (BID) | SUBCUTANEOUS | Status: DC
  Administered 2011-06-17: 80 mg via SUBCUTANEOUS
  Filled 2011-06-17 (×3): qty 0.8

## 2011-06-17 MED ORDER — DILTIAZEM HCL ER 240 MG PO CP24
240.0000 mg | ORAL_CAPSULE | Freq: Every day | ORAL | Status: DC
  Filled 2011-06-17: qty 1

## 2011-06-17 MED ORDER — DIAZEPAM 5 MG PO TABS
ORAL_TABLET | ORAL | Status: AC
  Filled 2011-06-17: qty 1

## 2011-06-17 MED ORDER — SIMVASTATIN 20 MG PO TABS
20.0000 mg | ORAL_TABLET | Freq: Every day | ORAL | Status: DC
  Administered 2011-06-17 – 2011-06-18 (×2): 20 mg via ORAL
  Filled 2011-06-17 (×3): qty 1

## 2011-06-17 MED ORDER — SODIUM CHLORIDE 0.9 % IV SOLN
INTRAVENOUS | Status: DC
  Administered 2011-06-17: 10:00:00 via INTRAVENOUS

## 2011-06-17 MED ORDER — GLYBURIDE 5 MG PO TABS
5.0000 mg | ORAL_TABLET | Freq: Every day | ORAL | Status: DC
  Administered 2011-06-17 – 2011-06-18 (×2): 5 mg via ORAL
  Filled 2011-06-17 (×3): qty 1

## 2011-06-17 MED ORDER — FINASTERIDE 5 MG PO TABS
5.0000 mg | ORAL_TABLET | Freq: Every day | ORAL | Status: DC
  Administered 2011-06-18 – 2011-06-19 (×2): 5 mg via ORAL
  Filled 2011-06-17 (×3): qty 1

## 2011-06-17 MED ORDER — POTASSIUM CHLORIDE CRYS ER 20 MEQ PO TBCR
20.0000 meq | EXTENDED_RELEASE_TABLET | Freq: Every day | ORAL | Status: DC
  Administered 2011-06-18 – 2011-06-19 (×2): 20 meq via ORAL
  Filled 2011-06-17 (×3): qty 1

## 2011-06-17 MED ORDER — INSULIN ASPART 100 UNIT/ML ~~LOC~~ SOLN: 0.0000 [IU] | SUBCUTANEOUS | Status: DC

## 2011-06-17 MED ORDER — NITROGLYCERIN 0.2 MG/ML ON CALL CATH LAB
INTRAVENOUS | Status: AC
  Filled 2011-06-17: qty 1

## 2011-06-17 NOTE — Significant Event (Signed)
Called to patient's room to assess groin. Upon arrival to room, patient's groin noted to be bleeding with dressing saturated. Moderate amount of blood noted on the bed linens as well.  Per patient report, after using his urinal and attempting to tie his gown, he noticed that his groin began to bleed. Patient assisted to lying position. Pressure applied to groin x 20 minutes. Groin bruised with small palpable hematoma noted above insertion site. Groin redressed with pressure dressing. Patient instructed to remain on bedrest as well as to limit movement of left lower extremity. Patient verbalized understanding.

## 2011-06-17 NOTE — Op Note (Signed)
Ronald [name] is a 78 y.o. male    [id number] LOCATION:  FACILITY: [facility]  PHYSICIAN: Nanetta Batty, M.D. 1923/02/03   DATE OF PROCEDURE:  06/17/2011  DATE OF DISCHARGE:  [facility]  PV Intervention    History obtained from chart review. Ronald [name] is a thin 78 year old widowed Caucasian male father of 3 [name] history of recent aortic valve replacement 01/07/2011 with a magnet ease bovine #23 bioprosthesis for critical aortic stenosis. He had normal coronary arteries and normal LV function. He had stenting of his right renal artery in the past as well as both of his iliac arteries by Dr. Guinevere Scarlet. His other problems include COPD with ongoing tobacco abuse, treated hypertension, dyslipidemia, type 2 diabetes, and atrial fibrillation, rate controlled on Coumadin anticoagulation. We have tried cardioversion recently unsuccessfully. He is participating cortically habitus claudication. Lower extremity Dopplers performed in our office 01/01/2011 revealed ABIs of 0.7 range with high-grade SFA disease bilaterally and patent stents.the patient presents today for abdominal aortography with bifemoral runoff potential endovascular intervention for lifestyle limiting claudication. He did stop his Coumadin several days prior to the procedure and followed a Lovenox bridge   PROCEDURE DESCRIPTION:    The patient was brought to the second floor  [name] in the postabsorptive state. He was  premedicated with IV Versed and fentanyl.Marland Kitchen His was was prepped and shaved in usual sterile fashion. Xylocaine 1% was used  for local anesthesia. A 5 French sheath was inserted into the left common femoral  artery using standard Seldinger technique. A 5 [place] tennis racket catheter was used for abdominal aortography with bifemoral runoff using bolus chase digital subtraction step table technique. Visipaque dye was used for the entirety of the case. Retrograde no aortic  pressure was monitored during the case.Marland Kitchen  HEMODYNAMICS:    AO SYSTOLIC/AO DIASTOLIC: 179/68    ANGIOGRAPHIC RESULTS:   1: abdominal aortogram -renal arteries are widely patent. It filled on the aorta was free of significant atherosclerotic changes.  2: Left lower extremity-the left external iliac artery stent was widely patent. There was a 50% segmental proximal and 90% segmental mid fluoroscopically calcified left SFA stenosis with 2 vessel runoff. The anterior tibial is occluded.  3: Right lower extremity-the right external iliac artery stent was widely patent. There was a calcified 90% segmental proximal right SFA stenosis, 50% segmental mid SFA stenosis with one vessel runoff via the peroneal   IMPRESSION:high-grade bilateral calcified SFA disease. Proceed with diamondback orbital rotational atherectomy plus or minus angioscope cutting balloon atherectomy for highly calcified lesion in the right SFA causing lifestyle limiting claudication.  Procedure description:contralateral access was obtained with a 5 [place] crossover catheter, angled Glidewire, Rosen wire, and 7 French crossover sheath. The lesion was crossed with a 014 [place] core wire through an 018 quick cross endhole catheter.the patient received 5000 units of packed heparin intravenously with an ACT of 223. A total of 190 cc of contrast was administered to the patient. Marland Kitchen014 viper wire was then advanced across catheter into the distal right SFA. Orbital rotational atherectomy was performed with 2.0 mm  Stealth atherectomy device. Multiple passes were performed up to 120,000 rpm's. Following this angiography revealed a patent vessel with residual disease. Cutting balloon atherectomy was performed with a 4 cm in length by 5 mm diameter endoscoped atherectomy catheter up to 8 atmospheres for 3 minutes resulting reduction of a 90% calcified proximal segmental right SFA stenosis to less than 10% residual with a small  nonflow limiting linear  dissection. Angiography below the knee revealed his perineal and widely patent.  Final impression: Successful diamondback orbital rotational atherectomy, and endoscoped cutting balloon atherectomy of the proximal right SFA highly calcified lesion. The sheath was then withdrawn across the bifurcation. The patient left on stable condition. He is already on aspirin and Plavix. He'll be gently hydrated overnight and discharged on the morning on Lovenox followed by re coumadinization.  [name] MD, [facility] 06/17/2011 12:13 PM

## 2011-06-17 NOTE — H&P (Signed)
H & P will be scanned in.  Pt was reexamined and existing H & P reviewed. No changes found.  [name], MD [facility] 06/17/2011 10:56 AM

## 2011-06-17 NOTE — Progress Notes (Signed)
Arrived to floor with left sheath to groin site level 0 some drainage to dressing ACT and SBP needed to be managed and monitor before removing. Sheath was pulled by Cath Lab RN at bedside no complications and site a level 0.

## 2011-06-17 NOTE — Progress Notes (Addendum)
ANTICOAGULATION CONSULT NOTE - Initial Consult  Pharmacy Consult for lovenox Indication: atrial fib  Allergies  Allergen Reactions  . Aspirin Shortness Of Breath and Rash    Patient Measurements: Height: 5' 11.5" (181.6 cm) Weight: 180 lb (81.647 kg) IBW/kg (Calculated) : 76.45  Heparin Dosing Weight: 80kg   Vital Signs: Temp: 97.5 F (36.4 C) (04/22 0851) Temp src: Oral (04/22 0851) Pulse Rate: 68  (04/22 1039)  Labs:  Basename 06/17/11 0926  HGB --  HCT --  PLT --  APTT --  LABPROT 16.1*  INR 1.26  HEPARINUNFRC --  CREATININE --  CKTOTAL --  CKMB --  TROPONINI --   Estimated Creatinine Clearance: 49.8 ml/min (by C-G formula based on Cr of 1.13).  Medical History: Past Medical History  Diagnosis Date  . AS (aortic stenosis)   . DM2 (diabetes mellitus, type 2)   . HTN (hypertension)   . Heart murmur   . Thrombocytopenia due to drugs     seen by [name] plts 114000 no rx  . Cancer     Bladder Cancer local  . Neuropathy due to secondary diabetes   . Dysrhythmia 5-7 weeks ago ... sees [name]  . Peripheral vascular disease very poor circulation legs and feet ... stents right and left legs... done in [name] 's office.   . Depression wife died 4 years ago.      Medications:  Scheduled:    . benazepril  10 mg Oral Daily  . clopidogrel  75 mg Oral Daily  . diltiazem  240 mg Oral Daily  . doxazosin  4 mg Oral QHS  . fentaNYL      . finasteride  5 mg Oral Daily  . Fluticasone-Salmeterol  1 puff Inhalation BID  . furosemide  20 mg Oral Daily  . glyBURIDE  5-10 mg Oral BID WC  . heparin      . heparin      . lidocaine      . loratadine  10 mg Oral Daily  . metoprolol  50 mg Oral BID  . midazolam      . nitroGLYCERIN      . potassium chloride SA  20 mEq Oral Daily  . simvastatin  20 mg Oral QHS  . DISCONTD: diazepam  5 mg Oral On Call  . DISCONTD: diltiazem  240 mg Oral Daily  . DISCONTD: insulin aspart  0-9 Units Subcutaneous Q4H    Infusions:    . sodium chloride    . DISCONTD: sodium chloride 75 mL/hr at 06/17/11 0941    Assessment: 78 yo male with atrial fib s/p cath will be started on lovenox tonight per MD's request.  INR today 1.26.  Per MD, will begin coumadin tomorrow with outpatient bridging.  On coumadin 2.5mg  po qday PTA. SCr 0.91; H/H 11.8/34.9; Plt 91  Goal of Therapy:  4hr heparin level 0.6-1.2   Plan:  1) Lovenox 80mg  sq q12h, 1st dose at 2200 tonight 2) CBC q72 hours 3) f/u on resuming coumadin tomorrow. 4) Monitor CBC closely.  Matilda Fleig, Tsz-Yin 06/17/2011,1:07 PM

## 2011-06-18 DIAGNOSIS — F17200 Nicotine dependence, unspecified, uncomplicated: Secondary | ICD-10-CM | POA: Diagnosis present

## 2011-06-18 DIAGNOSIS — J449 Chronic obstructive pulmonary disease, unspecified: Secondary | ICD-10-CM | POA: Diagnosis present

## 2011-06-18 DIAGNOSIS — I10 Essential (primary) hypertension: Secondary | ICD-10-CM | POA: Diagnosis present

## 2011-06-18 DIAGNOSIS — E119 Type 2 diabetes mellitus without complications: Secondary | ICD-10-CM | POA: Diagnosis present

## 2011-06-18 DIAGNOSIS — Z85828 Personal history of other malignant neoplasm of skin: Secondary | ICD-10-CM | POA: Diagnosis not present

## 2011-06-18 DIAGNOSIS — I70219 Atherosclerosis of native arteries of extremities with intermittent claudication, unspecified extremity: Secondary | ICD-10-CM | POA: Diagnosis not present

## 2011-06-18 DIAGNOSIS — Z954 Presence of other heart-valve replacement: Secondary | ICD-10-CM | POA: Diagnosis not present

## 2011-06-18 DIAGNOSIS — Z7901 Long term (current) use of anticoagulants: Secondary | ICD-10-CM | POA: Diagnosis not present

## 2011-06-18 LAB — BASIC METABOLIC PANEL
CO2: 25 mEq/L (ref 19–32)
Calcium: 9 mg/dL (ref 8.4–10.5)
GFR calc non Af Amer: 62 mL/min — ABNORMAL LOW (ref >90)
Potassium: 3.5 mEq/L (ref 3.5–5.1)
Sodium: 138 mEq/L (ref 135–145)

## 2011-06-18 LAB — CBC
Hemoglobin: 11.2 g/dL — ABNORMAL LOW (ref 13.0–17.0)
Hemoglobin: 10.8 g/dL — ABNORMAL LOW (ref 13.0–17.0)
MCH: 30.4 pg (ref 26.0–34.0)
MCHC: 34.5 g/dL (ref 30.0–36.0)
MCHC: 33.8 g/dL (ref 30.0–36.0)
MCV: 87.7 fL (ref 78.0–100.0)
MCV: 88.7 fL (ref 78.0–100.0)
Platelets: 88 10*3/uL — ABNORMAL LOW (ref 150–400)
Platelets: 96 10*3/uL — ABNORMAL LOW (ref 150–400)
Platelets: 76 10*3/uL — ABNORMAL LOW (ref 150–400)
RBC: 3.82 MIL/uL — ABNORMAL LOW (ref 4.22–5.81)
RBC: 3.65 MIL/uL — ABNORMAL LOW (ref 4.22–5.81)
RDW: 16.8 % — ABNORMAL HIGH (ref 11.5–15.5)
RDW: 17 % — ABNORMAL HIGH (ref 11.5–15.5)
WBC: 9.2 10*3/uL (ref 4.0–10.5)
WBC: 7.6 10*3/uL (ref 4.0–10.5)
WBC: 8.5 10*3/uL (ref 4.0–10.5)

## 2011-06-18 LAB — GLUCOSE, CAPILLARY
Glucose-Capillary: 264 mg/dL — ABNORMAL HIGH (ref 70–99)
Glucose-Capillary: 134 mg/dL — ABNORMAL HIGH (ref 70–99)

## 2011-06-18 LAB — POCT ACTIVATED CLOTTING TIME
Activated Clotting Time: 221 seconds
Activated Clotting Time: 226 seconds

## 2011-06-18 MED ORDER — HEPARIN (PORCINE) IN NACL 2-0.9 UNIT/ML-% IJ SOLN
INTRAMUSCULAR | Status: AC
  Filled 2011-06-18: qty 1000

## 2011-06-18 MED ORDER — INSULIN ASPART 100 UNIT/ML ~~LOC~~ SOLN: 0.0000 [IU] | Freq: Every day | SUBCUTANEOUS | Status: DC

## 2011-06-18 MED ORDER — WARFARIN SODIUM 2.5 MG PO TABS
2.5000 mg | ORAL_TABLET | Freq: Once | ORAL | Status: AC
  Administered 2011-06-18: 2.5 mg via ORAL
  Filled 2011-06-18: qty 1

## 2011-06-18 MED ORDER — WARFARIN - PHARMACIST DOSING INPATIENT
Freq: Every day | Status: DC
  Administered 2011-06-18: 18:00:00

## 2011-06-18 MED ORDER — POTASSIUM CHLORIDE CRYS ER 20 MEQ PO TBCR
20.0000 meq | EXTENDED_RELEASE_TABLET | Freq: Once | ORAL | Status: AC
  Administered 2011-06-18: 20 meq via ORAL

## 2011-06-18 MED ORDER — INSULIN ASPART 100 UNIT/ML ~~LOC~~ SOLN: 0.0000 [IU] | Freq: Three times a day (TID) | SUBCUTANEOUS | Status: DC

## 2011-06-18 MED ORDER — INSULIN ASPART 100 UNIT/ML ~~LOC~~ SOLN
0.0000 [IU] | Freq: Three times a day (TID) | SUBCUTANEOUS | Status: DC
  Administered 2011-06-18: 8 [IU] via SUBCUTANEOUS

## 2011-06-18 MED FILL — Nicardipine HCl IV Soln 2.5 MG/ML: INTRAVENOUS | Qty: 1 | Status: AC

## 2011-06-18 NOTE — Progress Notes (Signed)
Subjective: S/p bleed last pm and with hematoma.  Objective: Vital signs in last 24 hours: Temp:  [97.5 F (36.4 C)-99.3 F (37.4 C)] 98.7 F (37.1 C) (04/23 0700) Pulse Rate:  [56-100] 66  (04/23 0700) Resp:  [11-24] 17  (04/23 0700) BP: (120-170)/(44-84) 151/62 mmHg (04/23 0700) SpO2:  [93 %-100 %] 95 % (04/23 0700) Weight:  [81.647 kg (180 lb)-82.6 kg (182 lb 1.6 oz)] 82.6 kg (182 lb 1.6 oz) (04/23 0400) Weight change:  Last BM Date: 06/16/11 Intake/Output from previous day: +235 304/22 0701 - 04/23 0700 In: 1560 [P.O.:960; I.V.:600] Out: 1326 [Urine:1325; Stool:1] Intake/Output this shift:    PE: General: Heart: Lungs: Abd: Ext:    Lab Results:  Basename 06/18/11 0355 06/17/11 1315  WBC 9.2 7.5  HGB 11.2* 11.8*  HCT 33.5* 34.9*  PLT 88* 91*   BMET  Basename 06/18/11 0355 06/17/11 1315  NA 138 --  K 3.5 --  CL 104 --  CO2 25 --  GLUCOSE 184* --  BUN 19 --  CREATININE 1.04 0.91  CALCIUM 9.0 --   No results found for this basename: TROPONINI:2,CK,MB:2 in the last 72 hours  No results found for this basename: CHOL, HDL, LDLCALC, LDLDIRECT, TRIG, CHOLHDL   Lab Results  Component Value Date   HGBA1C 8.0* 01/03/2011     No results found for this basename: TSH     EKG: Orders placed during the hospital encounter of 04/03/11  . EKG 12-LEAD  . EKG 12-LEAD  . EKG 12-LEAD  . EKG 12-LEAD  . EKG    Studies/Results:  PV angiogram: Successful diamondback orbital rotational atherectomy, and endoscoped cutting balloon atherectomy of the proximal right SFA highly calcified lesion. The sheath was then withdrawn across the bifurcation.  Medications: I have reviewed the patient's current medications.    . benazepril  10 mg Oral Daily  . clopidogrel  75 mg Oral Q breakfast  . diltiazem  240 mg Oral Daily  . doxazosin  4 mg Oral QHS  . enoxaparin (LOVENOX) injection  80 mg Subcutaneous Q12H  . fentaNYL      . finasteride  5 mg Oral Daily  .  Fluticasone-Salmeterol  1 puff Inhalation BID  . furosemide  20 mg Oral Daily  . glyBURIDE  10 mg Oral Q breakfast  . glyBURIDE  5 mg Oral QAC supper  . heparin      . heparin      . heparin      . hydrALAZINE  10 mg Intravenous Once  . hydrALAZINE  10 mg Intravenous Once  . lidocaine      . loratadine  10 mg Oral Daily  . metoprolol  50 mg Oral BID  . midazolam      . morphine      . nitroGLYCERIN      . potassium chloride SA  20 mEq Oral Daily  . simvastatin  20 mg Oral q1800  . sodium chloride  3 mL Intravenous Q12H  . DISCONTD: diazepam  5 mg Oral On Call  . DISCONTD: diltiazem  240 mg Oral Daily  . DISCONTD: glyBURIDE  5-10 mg Oral BID WC  . DISCONTD: insulin aspart  0-9 Units Subcutaneous Q4H   Assessment/Plan: Patient Active Problem List  Diagnoses  . DIABETES MELLITUS, TYPE II  . HYPERLIPIDEMIA  . DEPRESSION  . RESTLESS LEGS SYNDROME  . HYPERTENSION  . CAD  . PVD  . DIVERTICULOSIS, COLON  . RENAL CYST  . ARTHRITIS  .  MURMUR  . CAROTID BRUIT  . SKIN CANCER, HX OF  . RHEUMATIC HEART DISEASE, HX OF  . AS (aortic stenosis)  . S/P aortic valve replacement: #23 Magna Ease Edwards Pericardial Valve    . History of atrial fibrillation  . Thrombocytopenia  . Leukocytosis  . Claudication in peripheral vascular disease  . S/P angioplasty with stent, diamond back rotational athrectomy Prox. Rt. SFA 06/17/2011   PLAN:Bled and has Level 1 hematoma post procedure. H/H stable.  Plts. Down to 8  Pre procedure was 107 Had been on Lovenox coumadin bridge for A. Fib.  Will recheck H/H now. No Lovenox.  Will restart  Coumadin per Dr. Allyson Sabal.  Also replace K+.   LOS: 1 day   Ronald [name],Ronald [name] 06/18/2011, 8:43 AM  Agree with note written by [name] RNP  S/P RSFA DB orbital atherectomy. Right foot warm. Level 1 left groin hematoma from bleed last PM. No bruit. Labs ok except for decreased plts.CAF with CVR. Will keep in hospital today to observe groin. Start coumadin  tonight without lovenox. Probably home tomorrow with LEA then ROV with me 1-2 weeks.  [name] 06/18/2011 9:00 AM

## 2011-06-18 NOTE — Progress Notes (Signed)
ANTICOAGULATION CONSULT NOTE - Initial Consult  Pharmacy Consult for coumadin Indication: atrial fibrillation  Allergies  Allergen Reactions  . Aspirin Shortness Of Breath and Rash    Patient Measurements: Height: 5' 11.5" (181.6 cm) Weight: 182 lb 1.6 oz (82.6 kg) IBW/kg (Calculated) : 76.45    Vital Signs: Temp: 98.2 F (36.8 C) (04/23 1140) Temp src: Oral (04/23 1140) BP: 137/51 mmHg (04/23 1140) Pulse Rate: 75  (04/23 1140)  Labs:  Basename 06/18/11 0937 06/18/11 0355 06/17/11 1315 06/17/11 0926  HGB 11.3* 11.2* -- --  HCT 32.8* 33.5* 34.9* --  PLT 96* 88* 91* --  APTT -- -- -- --  LABPROT -- -- -- 16.1*  INR -- -- -- 1.26  HEPARINUNFRC -- -- -- --  CREATININE -- 1.04 0.91 --  CKTOTAL -- -- -- --  CKMB -- -- -- --  TROPONINI -- -- -- --   Estimated Creatinine Clearance: 54.1 ml/min (by C-G formula based on Cr of 1.04).  Medical History: Past Medical History  Diagnosis Date  . AS (aortic stenosis)   . DM2 (diabetes mellitus, type 2)   . HTN (hypertension)   . Heart murmur   . Thrombocytopenia due to drugs     seen by [name] plts [id number] no rx  . Cancer     Bladder Cancer local  . Peripheral vascular disease very poor circulation legs and feet ... stents right and left legs... done in [name] 's office.   . Depression wife died 4 years ago.    Marland Kitchen COPD (chronic obstructive pulmonary disease)   . Dysrhythmia 5-7 weeks ago ... sees dr Doroteo Glassman fibrilation  . Shortness of breath   . Recurrent upper respiratory infection (URI)     sinusitis  . Neuropathy due to secondary diabetes   . Chronic kidney disease     renal artery stent  . Claudication in peripheral vascular disease 06/17/2011  . S/P angioplasty with stent, diamond back rotational athrectomy Prox. Rt. SFA 06/17/2011 06/17/2011     Assessment: 78 yo man s/p cardiac cath 4/22 with level 0-1 hematoma of groin vascular access site to resume coumadin for afib.  Hgb stable at 11.2, PLTC  low but stable at 88.  No bridge therapy with lovenox  2nd groin hematoma.  Home coumadin dose is 2.5 mg daily.  Goal of Therapy:  INR 2-3   Plan:  Coumadin 2.5 mg po x 1 dose today Daily INR   Herby Abraham, Pharm.D. [phone] 06/18/2011 2:05 PM

## 2011-06-18 NOTE — Progress Notes (Signed)
Inpatient Diabetes Program Recommendations  AACE/ADA: New Consensus Statement on Inpatient Glycemic Control  Target Ranges:  Prepandial:   less than 140 mg/dL      Peak postprandial:   less than 180 mg/dL (1-2 hours)      Critically ill patients:  140 - 180 mg/dL  Pager:  [phone] Hours:  8 am-10pm   Reason for Visit: Elevated glucose:  282, 184 mg/dL  Inpatient Diabetes Program Recommendations Correction (SSI): Add Novolog Correction

## 2011-06-18 NOTE — Progress Notes (Signed)
UR Completed. Simmons, Conchita Truxillo F [phone]  

## 2011-06-18 NOTE — Plan of Care (Signed)
Problem: Phase II Progression Outcomes Goal: Vascular site scale level 0 - I Vascular Site Scale Level 0: No bruising/bleeding/hematoma Level I (Mild): Bruising/Ecchymosis, minimal bleeding/ooozing, palpable hematoma < 3 cm Level II (Moderate): Bleeding not affecting hemodynamic parameters, pseudoaneurysm, palpable hematoma > 3 cm  Outcome: Progressing Level 2

## 2011-06-19 DIAGNOSIS — I482 Chronic atrial fibrillation, unspecified: Secondary | ICD-10-CM | POA: Diagnosis present

## 2011-06-19 DIAGNOSIS — S301XXA Contusion of abdominal wall, initial encounter: Secondary | ICD-10-CM | POA: Diagnosis not present

## 2011-06-19 DIAGNOSIS — Z888 Allergy status to other drugs, medicaments and biological substances status: Secondary | ICD-10-CM | POA: Diagnosis present

## 2011-06-19 LAB — CBC
HCT: 32.3 % — ABNORMAL LOW (ref 39.0–52.0)
RBC: 3.69 MIL/uL — ABNORMAL LOW (ref 4.22–5.81)
RDW: 16.7 % — ABNORMAL HIGH (ref 11.5–15.5)
WBC: 8 10*3/uL (ref 4.0–10.5)

## 2011-06-19 LAB — GLUCOSE, CAPILLARY: Glucose-Capillary: 107 mg/dL — ABNORMAL HIGH (ref 70–99)

## 2011-06-19 LAB — BASIC METABOLIC PANEL
BUN: 15 mg/dL (ref 6–23)
Chloride: 104 mEq/L (ref 96–112)
GFR calc Af Amer: 70 mL/min — ABNORMAL LOW (ref >90)
Potassium: 3.6 mEq/L (ref 3.5–5.1)

## 2011-06-19 LAB — PROTIME-INR: Prothrombin Time: 15.6 seconds — ABNORMAL HIGH (ref 11.6–15.2)

## 2011-06-19 MED ORDER — WARFARIN SODIUM 4 MG PO TABS
4.0000 mg | ORAL_TABLET | Freq: Once | ORAL | Status: DC
  Filled 2011-06-19: qty 1

## 2011-06-19 NOTE — Progress Notes (Signed)
ANTICOAGULATION CONSULT NOTE - Initial Consult  Pharmacy Consult for coumadin Indication: atrial fibrillation  Allergies  Allergen Reactions  . Aspirin Shortness Of Breath and Rash    Patient Measurements: Height: 5' 11.5" (181.6 cm) Weight: 182 lb 1.6 oz (82.6 kg) IBW/kg (Calculated) : 76.45    Vital Signs: Temp: 97.9 F (36.6 C) (04/24 0422) Temp src: Oral (04/24 0422) BP: 144/65 mmHg (04/24 0422) Pulse Rate: 77  (04/24 0422)  Labs:  Ronald [name] 06/19/11 0602 06/18/11 2110 06/18/11 1427 06/18/11 0355 06/17/11 1315 06/17/11 0926  HGB 11.0* 10.8* -- -- -- --  HCT 32.3* 32.0* 33.0* -- -- --  PLT 85* 92* 76* -- -- --  APTT -- -- -- -- -- --  LABPROT 15.6* -- -- -- -- 16.1*  INR 1.21 -- -- -- -- 1.26  HEPARINUNFRC -- -- -- -- -- --  CREATININE 1.07 -- -- 1.04 0.91 --  CKTOTAL -- -- -- -- -- --  CKMB -- -- -- -- -- --  TROPONINI -- -- -- -- -- --   Estimated Creatinine Clearance: 52.6 ml/min (by C-G formula based on Cr of 1.07).  Medical History: Past Medical History  Diagnosis Date  . AS (aortic stenosis)   . DM2 (diabetes mellitus, type 2)   . HTN (hypertension)   . Heart murmur   . Thrombocytopenia due to drugs     seen by [name] plts [id number] no rx  . Cancer     Bladder Cancer local  . Peripheral vascular disease very poor circulation legs and feet ... stents right and left legs... done in [name] 's office.   . Depression wife died 4 years ago.    Marland Kitchen COPD (chronic obstructive pulmonary disease)   . Dysrhythmia 5-7 weeks ago ... sees dr Doroteo Glassman fibrilation  . Shortness of breath   . Recurrent upper respiratory infection (URI)     sinusitis  . Neuropathy due to secondary diabetes   . Chronic kidney disease     renal artery stent  . Claudication in peripheral vascular disease 06/17/2011  . S/P angioplasty with stent, diamond back rotational athrectomy Prox. Rt. SFA 06/17/2011 06/17/2011     Assessment: 78 yo man s/p cardiac cath 4/22 with  level 0-1 hematoma of groin vascular access site to resume coumadin for afib.  Hgb stable at 11, PLTC low but stable at 85.  No bridge therapy with lovenox due to groin hematoma.  Home coumadin dose is 2.5 mg daily. INR remains subtherapeutic after 1 dose yesterday.  Goal of Therapy:  INR 2-3   Plan:  Coumadin 4 mg po x 1 dose today Daily INR   Bayard Hugger, PharmD, BCPS  Clinical Pharmacist  Pager: [phone]  06/19/2011 10:30 AM

## 2011-06-19 NOTE — Discharge Instructions (Addendum)
Groin Site Care Refer to this sheet in the next few weeks. These instructions provide you with information on caring for yourself after your procedure. Your caregiver may also give you more specific instructions. Your treatment has been planned according to current medical practices, but problems sometimes occur. Call your caregiver if you have any problems or questions after your procedure. HOME CARE INSTRUCTIONS  You may shower 24 hours after the procedure. Remove the bandage (dressing) and gently wash the site with plain soap and water. Gently pat the site dry.   Do not apply powder or lotion to the site.   Do not sit in a bathtub, swimming pool, or whirlpool for 5 to 7 days.   No bending, squatting, or lifting anything over 10 pounds (4.5 kg) as directed by your caregiver.   Inspect the site at least twice daily.   Do not drive home if you are discharged the same day of the procedure. Have someone else drive you.   You may drive 24 hours after the procedure unless otherwise instructed by your caregiver.  What to expect:  Any bruising will usually fade within 1 to 2 weeks.   Blood that collects in the tissue (hematoma) may be painful to the touch. It should usually decrease in size and tenderness within 1 to 2 weeks.  SEEK IMMEDIATE MEDICAL CARE IF:  You have unusual pain at the groin site or down the affected leg.   You have redness, warmth, swelling, or pain at the groin site.   You have drainage (other than a small amount of blood on the dressing).   You have chills.   You have a fever or persistent symptoms for more than 72 hours.   You have a fever and your symptoms suddenly get worse.   Your leg becomes pale, cool, tingly, or numb.   You have heavy bleeding from the site. Hold pressure on the site.  Document Released: 03/16/2010 Document Revised: 01/31/2011 Document Reviewed: 03/16/2010 [facility] Patient Information 2012 [place], [place].  No strenuous activity or  lifting more than 5lbs. Have INR done Monday

## 2011-06-19 NOTE — Progress Notes (Signed)
Pt. Discharged 06/19/2011  11:09 AM Discharge instructions reviewed with patient/family. Patient/family verbalized understanding. All Rx's given. Questions answered as needed. Pt. Discharged to home with family/self.  Ave Filter

## 2011-06-19 NOTE — Progress Notes (Addendum)
Addendum to Nutrition Section of Cardiac Rehab Program Progress Report  Pt following a step 1 Therapeutic Lifestyle Changes diet on admission to Cardiac Rehab. No post-rehab data available to evaluate at this time. Pt wt up 2 kg,  Evidence of 16.3% decrease in %body fat.

## 2011-06-19 NOTE — Discharge Summary (Signed)
[name],  MRN: [id number], DOB/AGE: 03/22/1922 78 y.o.  Admit date: 06/17/2011 Discharge date: 06/19/2011  Primary Care Provider: Dr Timothy Lasso Primary Cardiologist: Dr Allyson Sabal  Discharge Diagnoses:  Principal Problem:  *Claudication in peripheral vascular disease  Active Problems:  DIABETES MELLITUS, TYPE II  HYPERTENSION  PVD  S/P aortic valve replacement: #23 Magna Ease Edwards Pericardial Valve    History of atrial fibrillation, failed DCCV Feb 2013  S/P angioplasty with stent, diamond back rotational athrectomy Prox. Rt. SFA 06/17/2011  Chronic anticoagulation  Aspirin allergy, (he is on Plavix)  Lt Groin hematoma after PTA 06/17/11    Procedures: PVA with diamondback HSRA to Rt [facility] Course  Ronald [name] is a thin 78 year old widowed Caucasian male father of 3 with a  history of recent aortic valve replacement 01/07/2011 with a Magna Ease bovine #23 bioprosthesis for critical aortic stenosis. He had normal coronary arteries and normal LV function. He had stenting of his right renal artery in the past as well as both of his iliac arteries by Dr. Orson Slick. His other problems include COPD with ongoing tobacco abuse, treated hypertension, dyslipidemia, type 2 diabetes, and atrial fibrillation, rate controlled on Coumadin anticoagulation. We have tried cardioversion recently unsuccessfully. He is participating in cardiac rehab and developed claudication. Lower extremity Dopplers performed in our office 01/01/2011 revealed ABIs of 0.7 range with high-grade SFA disease bilaterally and patent stents..The patient was admitted 06/17/11 for abdominal aortography with bifemoral runoff potential endovascular intervention for lifestyle limiting claudication. He did stop his Coumadin several days prior to the procedure and followed a Lovenox bridge. The patient was admitted and had PVA with diamondback HSRA to the Rt SFA on 06/17/11. He tolerated this well but developed a Lt femoral  hematoma, (level 1). His blood count was stable. He was kept an extra 24hrs to observe his groin. Dr Allyson Sabal feels he can discharged 06/19/11 without Lovenox bridging. He will be set up for OP dopplers and an INR next week.   Discharge Vitals:  Blood pressure 144/65, pulse 77, temperature 97.9 F (36.6 C), temperature source Oral, resp. rate 19, height 5' 11.5" (1.816 m), weight 82.6 kg (182 lb 1.6 oz), SpO2 95.00%.    Labs: Results for orders placed during the hospital encounter of 06/17/11 (from the past 48 hour(s))  POCT ACTIVATED CLOTTING TIME     Status: Normal   Collection Time   06/17/11 11:30 AM      Component Value Range Comment   Activated Clotting Time 226     POCT ACTIVATED CLOTTING TIME     Status: Normal   Collection Time   06/17/11 12:04 PM      Component Value Range Comment   Activated Clotting Time 221     CBC     Status: Abnormal   Collection Time   06/17/11  1:15 PM      Component Value Range Comment   WBC 7.5  4.0 - 10.5 (K/uL)    RBC 3.93 (*) 4.22 - 5.81 (MIL/uL)    Hemoglobin 11.8 (*) 13.0 - 17.0 (g/dL)    HCT 82.9 (*) 56.2 - 52.0 (%)    MCV 88.8  78.0 - 100.0 (fL)    MCH 30.0  26.0 - 34.0 (pg)    MCHC 33.8  30.0 - 36.0 (g/dL)    RDW 13.0 (*) 86.5 - 15.5 (%)    Platelets 91 (*) 150 - 400 (K/uL) PLATELET COUNT CONFIRMED BY SMEAR  CREATININE, SERUM  Status: Abnormal   Collection Time   06/17/11  1:15 PM      Component Value Range Comment   Creatinine, Ser 0.91  0.50 - 1.35 (mg/dL)    GFR calc non Af Amer 74 (*) >90 (mL/min)    GFR calc Af Amer 86 (*) >90 (mL/min)   GLUCOSE, CAPILLARY     Status: Abnormal   Collection Time   06/17/11  1:42 PM      Component Value Range Comment   Glucose-Capillary 116 (*) 70 - 99 (mg/dL)   POCT ACTIVATED CLOTTING TIME     Status: Normal   Collection Time   06/17/11  3:23 PM      Component Value Range Comment   Activated Clotting Time 155     GLUCOSE, CAPILLARY     Status: Abnormal   Collection Time   06/17/11  9:36 PM       Component Value Range Comment   Glucose-Capillary 282 (*) 70 - 99 (mg/dL)   BASIC METABOLIC PANEL     Status: Abnormal   Collection Time   06/18/11  3:55 AM      Component Value Range Comment   Sodium 138  135 - 145 (mEq/L)    Potassium 3.5  3.5 - 5.1 (mEq/L)    Chloride 104  96 - 112 (mEq/L)    CO2 25  19 - 32 (mEq/L)    Glucose, Bld 184 (*) 70 - 99 (mg/dL)    BUN 19  6 - 23 (mg/dL)    Creatinine, Ser 4.78  0.50 - 1.35 (mg/dL)    Calcium 9.0  8.4 - 10.5 (mg/dL)    GFR calc non Af Amer 62 (*) >90 (mL/min)    GFR calc Af Amer 72 (*) >90 (mL/min)   CBC     Status: Abnormal   Collection Time   06/18/11  3:55 AM      Component Value Range Comment   WBC 9.2  4.0 - 10.5 (K/uL)    RBC 3.82 (*) 4.22 - 5.81 (MIL/uL)    Hemoglobin 11.2 (*) 13.0 - 17.0 (g/dL)    HCT 29.5 (*) 62.1 - 52.0 (%)    MCV 87.7  78.0 - 100.0 (fL)    MCH 29.3  26.0 - 34.0 (pg)    MCHC 33.4  30.0 - 36.0 (g/dL)    RDW 30.8 (*) 65.7 - 15.5 (%)    Platelets 88 (*) 150 - 400 (K/uL) CONSISTENT WITH PREVIOUS RESULT  CBC     Status: Abnormal   Collection Time   06/18/11  9:37 AM      Component Value Range Comment   WBC 8.8  4.0 - 10.5 (K/uL)    RBC 3.72 (*) 4.22 - 5.81 (MIL/uL)    Hemoglobin 11.3 (*) 13.0 - 17.0 (g/dL)    HCT 84.6 (*) 96.2 - 52.0 (%)    MCV 88.2  78.0 - 100.0 (fL)    MCH 30.4  26.0 - 34.0 (pg)    MCHC 34.5  30.0 - 36.0 (g/dL)    RDW 95.2 (*) 84.1 - 15.5 (%)    Platelets 96 (*) 150 - 400 (K/uL) CONSISTENT WITH PREVIOUS RESULT  GLUCOSE, CAPILLARY     Status: Abnormal   Collection Time   06/18/11  1:49 PM      Component Value Range Comment   Glucose-Capillary 264 (*) 70 - 99 (mg/dL)   CBC     Status: Abnormal   Collection Time   06/18/11  2:27  PM      Component Value Range Comment   WBC 7.6  4.0 - 10.5 (K/uL)    RBC 3.72 (*) 4.22 - 5.81 (MIL/uL)    Hemoglobin 11.1 (*) 13.0 - 17.0 (g/dL)    HCT 96.0 (*) 45.4 - 52.0 (%)    MCV 88.7  78.0 - 100.0 (fL)    MCH 29.8  26.0 - 34.0 (pg)    MCHC 33.6   30.0 - 36.0 (g/dL)    RDW 09.8 (*) 11.9 - 15.5 (%)    Platelets 76 (*) 150 - 400 (K/uL) CONSISTENT WITH PREVIOUS RESULT  GLUCOSE, CAPILLARY     Status: Abnormal   Collection Time   06/18/11  9:09 PM      Component Value Range Comment   Glucose-Capillary 134 (*) 70 - 99 (mg/dL)   CBC     Status: Abnormal   Collection Time   06/18/11  9:10 PM      Component Value Range Comment   WBC 8.5  4.0 - 10.5 (K/uL)    RBC 3.65 (*) 4.22 - 5.81 (MIL/uL)    Hemoglobin 10.8 (*) 13.0 - 17.0 (g/dL)    HCT 14.7 (*) 82.9 - 52.0 (%)    MCV 87.7  78.0 - 100.0 (fL)    MCH 29.6  26.0 - 34.0 (pg)    MCHC 33.8  30.0 - 36.0 (g/dL)    RDW 56.2 (*) 13.0 - 15.5 (%)    Platelets 92 (*) 150 - 400 (K/uL) CONSISTENT WITH PREVIOUS RESULT  CBC     Status: Abnormal   Collection Time   06/19/11  6:02 AM      Component Value Range Comment   WBC 8.0  4.0 - 10.5 (K/uL)    RBC 3.69 (*) 4.22 - 5.81 (MIL/uL)    Hemoglobin 11.0 (*) 13.0 - 17.0 (g/dL)    HCT 86.5 (*) 78.4 - 52.0 (%)    MCV 87.5  78.0 - 100.0 (fL)    MCH 29.8  26.0 - 34.0 (pg)    MCHC 34.1  30.0 - 36.0 (g/dL)    RDW 69.6 (*) 29.5 - 15.5 (%)    Platelets 85 (*) 150 - 400 (K/uL) CONSISTENT WITH PREVIOUS RESULT  BASIC METABOLIC PANEL     Status: Abnormal   Collection Time   06/19/11  6:02 AM      Component Value Range Comment   Sodium 139  135 - 145 (mEq/L)    Potassium 3.6  3.5 - 5.1 (mEq/L)    Chloride 104  96 - 112 (mEq/L)    CO2 27  19 - 32 (mEq/L)    Glucose, Bld 120 (*) 70 - 99 (mg/dL)    BUN 15  6 - 23 (mg/dL)    Creatinine, Ser 2.84  0.50 - 1.35 (mg/dL)    Calcium 9.3  8.4 - 10.5 (mg/dL)    GFR calc non Af Amer 60 (*) >90 (mL/min)    GFR calc Af Amer 70 (*) >90 (mL/min)   PROTIME-INR     Status: Abnormal   Collection Time   06/19/11  6:02 AM      Component Value Range Comment   Prothrombin Time 15.6 (*) 11.6 - 15.2 (seconds)    INR 1.21  0.00 - 1.49    GLUCOSE, CAPILLARY     Status: Abnormal   Collection Time   06/19/11  6:32 AM       Component Value Range Comment   Glucose-Capillary 107 (*) 70 -  99 (mg/dL)     Disposition:  Follow-up Information    Follow up with [name], MD. (office will call)    Contact information:   [address] [place] [address] [phone]          Discharge Medications:  Medication List  As of 06/19/2011 10:37 AM   TAKE these medications         acetaminophen 500 MG tablet   Commonly known as: TYLENOL   Take 1,000 mg by mouth every 6 (six) hours as needed. For pain      benazepril 10 MG tablet   Commonly known as: LOTENSIN   Take 10 mg by mouth daily.      clopidogrel 75 MG tablet   Commonly known as: PLAVIX   Take 1 tablet (75 mg total) by mouth daily.      diltiazem 240 MG 24 hr capsule   Commonly known as: DILACOR XR   Take 240 mg by mouth daily.      doxazosin 4 MG tablet   Commonly known as: CARDURA   Take 4 mg by mouth at bedtime.      finasteride 5 MG tablet   Commonly known as: PROSCAR   Take 5 mg by mouth daily.      Fluticasone-Salmeterol 250-50 MCG/DOSE Aepb   Commonly known as: ADVAIR   Inhale 1 puff into the lungs 2 (two) times daily.      furosemide 20 MG tablet   Commonly known as: LASIX   Take 20 mg by mouth daily.      glyBURIDE 5 MG tablet   Commonly known as: DIABETA   Take 5-10 mg by mouth 2 (two) times daily with a meal. 2 tablets in the morning and 1 tablet in the evening      loratadine 10 MG tablet   Commonly known as: CLARITIN   Take 1 tablet (10 mg total) by mouth daily.      metoprolol 50 MG tablet   Commonly known as: LOPRESSOR   Take 50 mg by mouth 2 (two) times daily.      potassium chloride SA 20 MEQ tablet   Commonly known as: K-DUR,KLOR-CON   Take 20 mEq by mouth daily.      simvastatin 20 MG tablet   Commonly known as: ZOCOR   Take 20 mg by mouth at bedtime.      warfarin 5 MG tablet   Commonly known as: COUMADIN   Take 2.5 mg by mouth daily.            Outstanding  Labs/Studies  Duration of Discharge Encounter: Greater than 30 minutes including physician time.  Jolene Provost PA-C 06/19/2011 10:37 AM

## 2011-06-19 NOTE — Progress Notes (Signed)
Subjective:  No CP/SOB  Objective:  Temp:  [97.9 F (36.6 C)-98.7 F (37.1 C)] 97.9 F (36.6 C) (04/24 0422) Pulse Rate:  [72-77] 77  (04/24 0422) Resp:  [19-21] 19  (04/24 0422) BP: (137-144)/(51-81) 144/65 mmHg (04/24 0422) SpO2:  [94 %-97 %] 95 % (04/24 0806) Weight change:   Intake/Output from previous day: 04/23 0701 - 04/24 0700 In: -  Out: 1100 [Urine:1100]  Intake/Output from this shift:    Physical Exam: General appearance: alert and cooperative Neck: no adenopathy, no carotid bruit, no JVD, supple, symmetrical, trachea midline and thyroid not enlarged, symmetric, no tenderness/mass/nodules Lungs: clear to auscultation bilaterally Heart: irregularly irregular rhythm Extremities: Left groin with a small hematoma with surrounding ecchymosis and a soft bruit Pulses: 2+ and symmetric 1+ PP bilaterally  Lab Results: Results for orders placed during the hospital encounter of 06/17/11 (from the past 48 hour(s))  POCT ACTIVATED CLOTTING TIME     Status: Normal   Collection Time   06/17/11 11:30 AM      Component Value Range Comment   Activated Clotting Time 226     POCT ACTIVATED CLOTTING TIME     Status: Normal   Collection Time   06/17/11 12:04 PM      Component Value Range Comment   Activated Clotting Time 221     CBC     Status: Abnormal   Collection Time   06/17/11  1:15 PM      Component Value Range Comment   WBC 7.5  4.0 - 10.5 (K/uL)    RBC 3.93 (*) 4.22 - 5.81 (MIL/uL)    Hemoglobin 11.8 (*) 13.0 - 17.0 (g/dL)    HCT 16.1 (*) 09.6 - 52.0 (%)    MCV 88.8  78.0 - 100.0 (fL)    MCH 30.0  26.0 - 34.0 (pg)    MCHC 33.8  30.0 - 36.0 (g/dL)    RDW 04.5 (*) 40.9 - 15.5 (%)    Platelets 91 (*) 150 - 400 (K/uL) PLATELET COUNT CONFIRMED BY SMEAR  CREATININE, SERUM     Status: Abnormal   Collection Time   06/17/11  1:15 PM      Component Value Range Comment   Creatinine, Ser 0.91  0.50 - 1.35 (mg/dL)    GFR calc non Af Amer 74 (*) >90 (mL/min)    GFR calc Af  Amer 86 (*) >90 (mL/min)   GLUCOSE, CAPILLARY     Status: Abnormal   Collection Time   06/17/11  1:42 PM      Component Value Range Comment   Glucose-Capillary 116 (*) 70 - 99 (mg/dL)   POCT ACTIVATED CLOTTING TIME     Status: Normal   Collection Time   06/17/11  3:23 PM      Component Value Range Comment   Activated Clotting Time 155     GLUCOSE, CAPILLARY     Status: Abnormal   Collection Time   06/17/11  9:36 PM      Component Value Range Comment   Glucose-Capillary 282 (*) 70 - 99 (mg/dL)   BASIC METABOLIC PANEL     Status: Abnormal   Collection Time   06/18/11  3:55 AM      Component Value Range Comment   Sodium 138  135 - 145 (mEq/L)    Potassium 3.5  3.5 - 5.1 (mEq/L)    Chloride 104  96 - 112 (mEq/L)    CO2 25  19 - 32 (mEq/L)    Glucose,  Bld 184 (*) 70 - 99 (mg/dL)    BUN 19  6 - 23 (mg/dL)    Creatinine, Ser 4.09  0.50 - 1.35 (mg/dL)    Calcium 9.0  8.4 - 10.5 (mg/dL)    GFR calc non Af Amer 62 (*) >90 (mL/min)    GFR calc Af Amer 72 (*) >90 (mL/min)   CBC     Status: Abnormal   Collection Time   06/18/11  3:55 AM      Component Value Range Comment   WBC 9.2  4.0 - 10.5 (K/uL)    RBC 3.82 (*) 4.22 - 5.81 (MIL/uL)    Hemoglobin 11.2 (*) 13.0 - 17.0 (g/dL)    HCT 81.1 (*) 91.4 - 52.0 (%)    MCV 87.7  78.0 - 100.0 (fL)    MCH 29.3  26.0 - 34.0 (pg)    MCHC 33.4  30.0 - 36.0 (g/dL)    RDW 78.2 (*) 95.6 - 15.5 (%)    Platelets 88 (*) 150 - 400 (K/uL) CONSISTENT WITH PREVIOUS RESULT  CBC     Status: Abnormal   Collection Time   06/18/11  9:37 AM      Component Value Range Comment   WBC 8.8  4.0 - 10.5 (K/uL)    RBC 3.72 (*) 4.22 - 5.81 (MIL/uL)    Hemoglobin 11.3 (*) 13.0 - 17.0 (g/dL)    HCT 21.3 (*) 08.6 - 52.0 (%)    MCV 88.2  78.0 - 100.0 (fL)    MCH 30.4  26.0 - 34.0 (pg)    MCHC 34.5  30.0 - 36.0 (g/dL)    RDW 57.8 (*) 46.9 - 15.5 (%)    Platelets 96 (*) 150 - 400 (K/uL) CONSISTENT WITH PREVIOUS RESULT  GLUCOSE, CAPILLARY     Status: Abnormal   Collection  Time   06/18/11  1:49 PM      Component Value Range Comment   Glucose-Capillary 264 (*) 70 - 99 (mg/dL)   CBC     Status: Abnormal   Collection Time   06/18/11  2:27 PM      Component Value Range Comment   WBC 7.6  4.0 - 10.5 (K/uL)    RBC 3.72 (*) 4.22 - 5.81 (MIL/uL)    Hemoglobin 11.1 (*) 13.0 - 17.0 (g/dL)    HCT 62.9 (*) 52.8 - 52.0 (%)    MCV 88.7  78.0 - 100.0 (fL)    MCH 29.8  26.0 - 34.0 (pg)    MCHC 33.6  30.0 - 36.0 (g/dL)    RDW 41.3 (*) 24.4 - 15.5 (%)    Platelets 76 (*) 150 - 400 (K/uL) CONSISTENT WITH PREVIOUS RESULT  GLUCOSE, CAPILLARY     Status: Abnormal   Collection Time   06/18/11  9:09 PM      Component Value Range Comment   Glucose-Capillary 134 (*) 70 - 99 (mg/dL)   CBC     Status: Abnormal   Collection Time   06/18/11  9:10 PM      Component Value Range Comment   WBC 8.5  4.0 - 10.5 (K/uL)    RBC 3.65 (*) 4.22 - 5.81 (MIL/uL)    Hemoglobin 10.8 (*) 13.0 - 17.0 (g/dL)    HCT 01.0 (*) 27.2 - 52.0 (%)    MCV 87.7  78.0 - 100.0 (fL)    MCH 29.6  26.0 - 34.0 (pg)    MCHC 33.8  30.0 - 36.0 (g/dL)    RDW 53.6 (*)  11.5 - 15.5 (%)    Platelets 92 (*) 150 - 400 (K/uL) CONSISTENT WITH PREVIOUS RESULT  CBC     Status: Abnormal   Collection Time   06/19/11  6:02 AM      Component Value Range Comment   WBC 8.0  4.0 - 10.5 (K/uL)    RBC 3.69 (*) 4.22 - 5.81 (MIL/uL)    Hemoglobin 11.0 (*) 13.0 - 17.0 (g/dL)    HCT 16.1 (*) 09.6 - 52.0 (%)    MCV 87.5  78.0 - 100.0 (fL)    MCH 29.8  26.0 - 34.0 (pg)    MCHC 34.1  30.0 - 36.0 (g/dL)    RDW 04.5 (*) 40.9 - 15.5 (%)    Platelets 85 (*) 150 - 400 (K/uL) CONSISTENT WITH PREVIOUS RESULT  BASIC METABOLIC PANEL     Status: Abnormal   Collection Time   06/19/11  6:02 AM      Component Value Range Comment   Sodium 139  135 - 145 (mEq/L)    Potassium 3.6  3.5 - 5.1 (mEq/L)    Chloride 104  96 - 112 (mEq/L)    CO2 27  19 - 32 (mEq/L)    Glucose, Bld 120 (*) 70 - 99 (mg/dL)    BUN 15  6 - 23 (mg/dL)    Creatinine, Ser  8.11  0.50 - 1.35 (mg/dL)    Calcium 9.3  8.4 - 10.5 (mg/dL)    GFR calc non Af Amer 60 (*) >90 (mL/min)    GFR calc Af Amer 70 (*) >90 (mL/min)   PROTIME-INR     Status: Abnormal   Collection Time   06/19/11  6:02 AM      Component Value Range Comment   Prothrombin Time 15.6 (*) 11.6 - 15.2 (seconds)    INR 1.21  0.00 - 1.49    GLUCOSE, CAPILLARY     Status: Abnormal   Collection Time   06/19/11  6:32 AM      Component Value Range Comment   Glucose-Capillary 107 (*) 70 - 99 (mg/dL)     Imaging: Imaging results have been reviewed  Assessment/Plan:   1. Principal Problem: 2.  *Claudication in peripheral vascular disease 3. Active Problems: 4.  DIABETES MELLITUS, TYPE II 5.  HYPERTENSION 6.  PVD 7.  S/P aortic valve replacement: #23 Magna Ease Edwards Pericardial Valve   8.  History of atrial fibrillation 9.  S/P angioplasty with stent, diamond back rotational athrectomy Prox. Rt. SFA 06/17/2011 10.   Time Spent Directly with Patient:  20 minutes  Length of Stay:  LOS: 2 days   Groin stable. Palpable right PP. Labs OK and stable. CAF with CVR. OK to D/C on coumadin A/C (no Lovenox secondary to groin bleed). Will check INR next week ibn our office as well as LEA then ROV with me.  [name] 06/19/2011, 9:39 AM

## 2011-06-24 DIAGNOSIS — I4891 Unspecified atrial fibrillation: Secondary | ICD-10-CM | POA: Diagnosis not present

## 2011-06-24 DIAGNOSIS — Z7901 Long term (current) use of anticoagulants: Secondary | ICD-10-CM | POA: Diagnosis not present

## 2011-06-26 ENCOUNTER — Encounter (HOSPITAL_COMMUNITY): Payer: Self-pay | Admitting: Emergency Medicine

## 2011-06-26 ENCOUNTER — Emergency Department (HOSPITAL_COMMUNITY): Payer: Medicare Other

## 2011-06-26 ENCOUNTER — Inpatient Hospital Stay (HOSPITAL_COMMUNITY)
Admission: EM | Admit: 2011-06-26 | Discharge: 2011-07-02 | DRG: 481 | Disposition: A | Discharge status: Skilled Nursing Facility | Payer: Medicare Other | Attending: Internal Medicine | Admitting: Internal Medicine

## 2011-06-26 DIAGNOSIS — D72829 Elevated white blood cell count, unspecified: Secondary | ICD-10-CM

## 2011-06-26 DIAGNOSIS — E118 Type 2 diabetes mellitus with unspecified complications: Secondary | ICD-10-CM | POA: Diagnosis present

## 2011-06-26 DIAGNOSIS — T148XXA Other injury of unspecified body region, initial encounter: Secondary | ICD-10-CM

## 2011-06-26 DIAGNOSIS — J189 Pneumonia, unspecified organism: Secondary | ICD-10-CM

## 2011-06-26 DIAGNOSIS — Z9889 Other specified postprocedural states: Secondary | ICD-10-CM

## 2011-06-26 DIAGNOSIS — J9819 Other pulmonary collapse: Secondary | ICD-10-CM | POA: Diagnosis not present

## 2011-06-26 DIAGNOSIS — I359 Nonrheumatic aortic valve disorder, unspecified: Secondary | ICD-10-CM | POA: Diagnosis not present

## 2011-06-26 DIAGNOSIS — S300XXA Contusion of lower back and pelvis, initial encounter: Secondary | ICD-10-CM | POA: Diagnosis present

## 2011-06-26 DIAGNOSIS — D62 Acute posthemorrhagic anemia: Secondary | ICD-10-CM | POA: Diagnosis not present

## 2011-06-26 DIAGNOSIS — I482 Chronic atrial fibrillation, unspecified: Secondary | ICD-10-CM | POA: Diagnosis present

## 2011-06-26 DIAGNOSIS — Z96649 Presence of unspecified artificial hip joint: Secondary | ICD-10-CM | POA: Diagnosis not present

## 2011-06-26 DIAGNOSIS — I4891 Unspecified atrial fibrillation: Secondary | ICD-10-CM | POA: Diagnosis present

## 2011-06-26 DIAGNOSIS — I798 Other disorders of arteries, arterioles and capillaries in diseases classified elsewhere: Secondary | ICD-10-CM | POA: Diagnosis not present

## 2011-06-26 DIAGNOSIS — R0602 Shortness of breath: Secondary | ICD-10-CM | POA: Diagnosis not present

## 2011-06-26 DIAGNOSIS — J4489 Other specified chronic obstructive pulmonary disease: Secondary | ICD-10-CM | POA: Diagnosis present

## 2011-06-26 DIAGNOSIS — Z471 Aftercare following joint replacement surgery: Secondary | ICD-10-CM | POA: Diagnosis not present

## 2011-06-26 DIAGNOSIS — E278 Other specified disorders of adrenal gland: Secondary | ICD-10-CM | POA: Diagnosis not present

## 2011-06-26 DIAGNOSIS — K5900 Constipation, unspecified: Secondary | ICD-10-CM | POA: Diagnosis present

## 2011-06-26 DIAGNOSIS — M84459D Pathological fracture, hip, unspecified, subsequent encounter for fracture with routine healing: Secondary | ICD-10-CM | POA: Diagnosis not present

## 2011-06-26 DIAGNOSIS — R6889 Other general symptoms and signs: Secondary | ICD-10-CM | POA: Diagnosis not present

## 2011-06-26 DIAGNOSIS — S72141A Displaced intertrochanteric fracture of right femur, initial encounter for closed fracture: Secondary | ICD-10-CM

## 2011-06-26 DIAGNOSIS — E785 Hyperlipidemia, unspecified: Secondary | ICD-10-CM | POA: Diagnosis present

## 2011-06-26 DIAGNOSIS — M810 Age-related osteoporosis without current pathological fracture: Secondary | ICD-10-CM | POA: Diagnosis present

## 2011-06-26 DIAGNOSIS — S7010XA Contusion of unspecified thigh, initial encounter: Secondary | ICD-10-CM | POA: Diagnosis present

## 2011-06-26 DIAGNOSIS — R52 Pain, unspecified: Secondary | ICD-10-CM | POA: Diagnosis not present

## 2011-06-26 DIAGNOSIS — IMO0002 Reserved for concepts with insufficient information to code with codable children: Secondary | ICD-10-CM | POA: Diagnosis not present

## 2011-06-26 DIAGNOSIS — Z954 Presence of other heart-valve replacement: Secondary | ICD-10-CM | POA: Diagnosis not present

## 2011-06-26 DIAGNOSIS — Z7901 Long term (current) use of anticoagulants: Secondary | ICD-10-CM | POA: Diagnosis not present

## 2011-06-26 DIAGNOSIS — J449 Chronic obstructive pulmonary disease, unspecified: Secondary | ICD-10-CM | POA: Diagnosis present

## 2011-06-26 DIAGNOSIS — Y9289 Other specified places as the place of occurrence of the external cause: Secondary | ICD-10-CM

## 2011-06-26 DIAGNOSIS — S72143A Displaced intertrochanteric fracture of unspecified femur, initial encounter for closed fracture: Secondary | ICD-10-CM | POA: Diagnosis not present

## 2011-06-26 DIAGNOSIS — Y9229 Other specified public building as the place of occurrence of the external cause: Secondary | ICD-10-CM | POA: Diagnosis not present

## 2011-06-26 DIAGNOSIS — I10 Essential (primary) hypertension: Secondary | ICD-10-CM | POA: Diagnosis not present

## 2011-06-26 DIAGNOSIS — S72001A Fracture of unspecified part of neck of right femur, initial encounter for closed fracture: Secondary | ICD-10-CM | POA: Diagnosis present

## 2011-06-26 DIAGNOSIS — J984 Other disorders of lung: Secondary | ICD-10-CM | POA: Diagnosis not present

## 2011-06-26 DIAGNOSIS — R296 Repeated falls: Secondary | ICD-10-CM | POA: Diagnosis not present

## 2011-06-26 DIAGNOSIS — I739 Peripheral vascular disease, unspecified: Secondary | ICD-10-CM | POA: Diagnosis present

## 2011-06-26 DIAGNOSIS — I499 Cardiac arrhythmia, unspecified: Secondary | ICD-10-CM | POA: Diagnosis not present

## 2011-06-26 DIAGNOSIS — E1349 Other specified diabetes mellitus with other diabetic neurological complication: Secondary | ICD-10-CM | POA: Diagnosis not present

## 2011-06-26 DIAGNOSIS — F17200 Nicotine dependence, unspecified, uncomplicated: Secondary | ICD-10-CM | POA: Diagnosis not present

## 2011-06-26 DIAGNOSIS — S72009A Fracture of unspecified part of neck of unspecified femur, initial encounter for closed fracture: Secondary | ICD-10-CM | POA: Diagnosis not present

## 2011-06-26 DIAGNOSIS — M25529 Pain in unspecified elbow: Secondary | ICD-10-CM | POA: Diagnosis not present

## 2011-06-26 DIAGNOSIS — E1159 Type 2 diabetes mellitus with other circulatory complications: Secondary | ICD-10-CM | POA: Diagnosis not present

## 2011-06-26 DIAGNOSIS — Z952 Presence of prosthetic heart valve: Secondary | ICD-10-CM

## 2011-06-26 DIAGNOSIS — R58 Hemorrhage, not elsewhere classified: Secondary | ICD-10-CM | POA: Diagnosis not present

## 2011-06-26 DIAGNOSIS — W010XXA Fall on same level from slipping, tripping and stumbling without subsequent striking against object, initial encounter: Secondary | ICD-10-CM | POA: Diagnosis present

## 2011-06-26 DIAGNOSIS — M25559 Pain in unspecified hip: Secondary | ICD-10-CM | POA: Diagnosis not present

## 2011-06-26 DIAGNOSIS — E1142 Type 2 diabetes mellitus with diabetic polyneuropathy: Secondary | ICD-10-CM | POA: Diagnosis not present

## 2011-06-26 LAB — DIFFERENTIAL
Basophils Absolute: 0 10*3/uL (ref 0.0–0.1)
Eosinophils Absolute: 0.3 10*3/uL (ref 0.0–0.7)
Lymphocytes Relative: 7 % — ABNORMAL LOW (ref 12–46)
Lymphs Abs: 1 10*3/uL (ref 0.7–4.0)
Neutro Abs: 12 10*3/uL — ABNORMAL HIGH (ref 1.7–7.7)

## 2011-06-26 LAB — BASIC METABOLIC PANEL
BUN: 17 mg/dL (ref 6–23)
Creatinine, Ser: 0.96 mg/dL (ref 0.50–1.35)
GFR calc Af Amer: 84 mL/min — ABNORMAL LOW (ref >90)
GFR calc non Af Amer: 72 mL/min — ABNORMAL LOW (ref >90)

## 2011-06-26 LAB — CBC
HCT: 36.4 % — ABNORMAL LOW (ref 39.0–52.0)
MCHC: 34.1 g/dL (ref 30.0–36.0)
MCV: 89.2 fL (ref 78.0–100.0)
RDW: 16.2 % — ABNORMAL HIGH (ref 11.5–15.5)

## 2011-06-26 MED ORDER — FENTANYL CITRATE 0.05 MG/ML IJ SOLN
50.0000 ug | Freq: Once | INTRAMUSCULAR | Status: AC
  Administered 2011-06-26: 50 ug via INTRAVENOUS
  Filled 2011-06-26: qty 2

## 2011-06-26 MED ORDER — HYDROMORPHONE HCL PF 1 MG/ML IJ SOLN
0.5000 mg | INTRAMUSCULAR | Status: DC | PRN
  Administered 2011-06-26 – 2011-07-01 (×8): 0.5 mg via INTRAVENOUS
  Filled 2011-06-26 (×8): qty 1

## 2011-06-26 MED ORDER — CEFAZOLIN SODIUM-DEXTROSE 2-3 GM-% IV SOLR: 2.0000 g | INTRAVENOUS | Status: DC

## 2011-06-26 MED ORDER — POTASSIUM CHLORIDE CRYS ER 20 MEQ PO TBCR
20.0000 meq | EXTENDED_RELEASE_TABLET | Freq: Every day | ORAL | Status: DC
  Administered 2011-06-26 – 2011-07-02 (×6): 20 meq via ORAL
  Filled 2011-06-26 (×7): qty 1

## 2011-06-26 MED ORDER — ONDANSETRON HCL 4 MG/2ML IJ SOLN
4.0000 mg | Freq: Once | INTRAMUSCULAR | Status: AC
  Administered 2011-06-26: 4 mg via INTRAVENOUS
  Filled 2011-06-26: qty 2

## 2011-06-26 MED ORDER — POLYETHYLENE GLYCOL 3350 17 G PO PACK
17.0000 g | PACK | Freq: Every day | ORAL | Status: DC | PRN
  Administered 2011-07-01: 17 g via ORAL
  Filled 2011-06-26 (×2): qty 1

## 2011-06-26 MED ORDER — METOPROLOL TARTRATE 50 MG PO TABS
50.0000 mg | ORAL_TABLET | Freq: Two times a day (BID) | ORAL | Status: DC
  Administered 2011-06-26 – 2011-07-02 (×11): 50 mg via ORAL
  Filled 2011-06-26 (×13): qty 1

## 2011-06-26 MED ORDER — SODIUM CHLORIDE 0.9 % IV SOLN
Freq: Once | INTRAVENOUS | Status: AC
  Administered 2011-06-26: 17:00:00 via INTRAVENOUS

## 2011-06-26 MED ORDER — FENTANYL CITRATE 0.05 MG/ML IJ SOLN
50.0000 ug | Freq: Once | INTRAMUSCULAR | Status: AC
  Administered 2011-06-26: 50 ug via INTRAVENOUS

## 2011-06-26 MED ORDER — ATORVASTATIN CALCIUM 10 MG PO TABS
10.0000 mg | ORAL_TABLET | Freq: Every day | ORAL | Status: DC
  Administered 2011-06-26 – 2011-07-01 (×6): 10 mg via ORAL
  Filled 2011-06-26 (×8): qty 1

## 2011-06-26 MED ORDER — SIMVASTATIN 20 MG PO TABS: 20.0000 mg | ORAL_TABLET | Freq: Every day | ORAL | Status: DC

## 2011-06-26 MED ORDER — PIPERACILLIN-TAZOBACTAM 3.375 G IVPB
3.3750 g | Freq: Once | INTRAVENOUS | Status: AC
  Administered 2011-06-26: 3.375 g via INTRAVENOUS
  Filled 2011-06-26: qty 50

## 2011-06-26 MED ORDER — CHLORHEXIDINE GLUCONATE 4 % EX LIQD
60.0000 mL | Freq: Once | CUTANEOUS | Status: AC
  Administered 2011-06-26: 4 via TOPICAL
  Filled 2011-06-26: qty 60

## 2011-06-26 MED ORDER — CEFAZOLIN SODIUM 1-5 GM-% IV SOLN
1.0000 g | Freq: Once | INTRAVENOUS | Status: AC
  Administered 2011-06-26: 1 g via INTRAVENOUS
  Filled 2011-06-26: qty 50

## 2011-06-26 MED ORDER — LORATADINE 10 MG PO TABS
10.0000 mg | ORAL_TABLET | Freq: Every day | ORAL | Status: DC
  Administered 2011-06-28 – 2011-07-02 (×5): 10 mg via ORAL
  Filled 2011-06-26 (×6): qty 1

## 2011-06-26 MED ORDER — DIAZEPAM 5 MG/ML IJ SOLN: 5.0000 mg | Freq: Four times a day (QID) | INTRAMUSCULAR | Status: DC | PRN

## 2011-06-26 MED ORDER — PIPERACILLIN-TAZOBACTAM 3.375 G IVPB
3.3750 g | Freq: Three times a day (TID) | INTRAVENOUS | Status: DC
  Administered 2011-06-27 – 2011-06-29 (×7): 3.375 g via INTRAVENOUS
  Filled 2011-06-26 (×9): qty 50

## 2011-06-26 MED ORDER — LACTATED RINGERS IV SOLN
INTRAVENOUS | Status: DC
  Administered 2011-06-26: via INTRAVENOUS

## 2011-06-26 MED ORDER — SODIUM CHLORIDE 0.9 % IJ SOLN
3.0000 mL | Freq: Two times a day (BID) | INTRAMUSCULAR | Status: DC
  Administered 2011-06-26 – 2011-07-01 (×8): 3 mL via INTRAVENOUS

## 2011-06-26 MED ORDER — DILTIAZEM HCL ER 240 MG PO CP24
240.0000 mg | ORAL_CAPSULE | Freq: Every day | ORAL | Status: DC
  Administered 2011-06-28 – 2011-07-02 (×5): 240 mg via ORAL
  Filled 2011-06-26 (×6): qty 1

## 2011-06-26 MED ORDER — TETANUS-DIPHTH-ACELL PERTUSSIS 5-2.5-18.5 LF-MCG/0.5 IM SUSP
0.5000 mL | Freq: Once | INTRAMUSCULAR | Status: AC
  Administered 2011-06-26: 0.5 mL via INTRAMUSCULAR
  Filled 2011-06-26: qty 0.5

## 2011-06-26 MED ORDER — FLUTICASONE-SALMETEROL 250-50 MCG/DOSE IN AEPB
1.0000 | INHALATION_SPRAY | Freq: Two times a day (BID) | RESPIRATORY_TRACT | Status: DC
  Administered 2011-06-26 – 2011-07-02 (×11): 1 via RESPIRATORY_TRACT
  Filled 2011-06-26: qty 14

## 2011-06-26 MED ORDER — OXYCODONE-ACETAMINOPHEN 5-325 MG PO TABS: 1.0000 | ORAL_TABLET | ORAL | Status: DC | PRN

## 2011-06-26 MED ORDER — VANCOMYCIN HCL IN DEXTROSE 1-5 GM/200ML-% IV SOLN
1000.0000 mg | Freq: Once | INTRAVENOUS | Status: AC
  Administered 2011-06-26: 1000 mg via INTRAVENOUS
  Filled 2011-06-26: qty 200

## 2011-06-26 MED ORDER — INSULIN ASPART 100 UNIT/ML ~~LOC~~ SOLN
0.0000 [IU] | Freq: Three times a day (TID) | SUBCUTANEOUS | Status: DC
  Administered 2011-06-27: 2 [IU] via SUBCUTANEOUS
  Administered 2011-06-27 – 2011-06-28 (×4): 3 [IU] via SUBCUTANEOUS
  Administered 2011-06-29: 5 [IU] via SUBCUTANEOUS
  Administered 2011-06-29 (×2): 3 [IU] via SUBCUTANEOUS
  Administered 2011-06-30: 4 [IU] via SUBCUTANEOUS
  Administered 2011-06-30: 7 [IU] via SUBCUTANEOUS
  Administered 2011-06-30 – 2011-07-02 (×5): 2 [IU] via SUBCUTANEOUS

## 2011-06-26 NOTE — ED Provider Notes (Signed)
History     CSN: [id number]  Arrival date & time 06/26/11  1535   First MD Initiated Contact with Patient 06/26/11 1548      Chief Complaint  Patient presents with  . Fall    (Consider location/radiation/quality/duration/timing/severity/associated sxs/prior treatment) HPI Comments: Patient just had a heart cath performed approximately one week ago by Dr. Gery Pray. He is a patient of Dr. Timothy Lasso with [facility]  Patient is a 78 y.o. male presenting with fall. The history is provided by the patient. No language interpreter was used.  Fall The accident occurred 1 to 2 hours ago. The fall occurred while walking. He fell from a height of 1 to 2 ft. He landed on concrete. The volume of blood lost was minimal. The point of impact was the right elbow and right hip. The pain is present in the right hip. The pain is moderate. He was not ambulatory at the scene. There was no entrapment after the fall. There was no alcohol use involved in the accident. Pertinent negatives include no fever, no numbness, no abdominal pain, no nausea, no vomiting, no headaches and no loss of consciousness. The symptoms are aggravated by activity and ambulation. He has tried nothing for the symptoms. The treatment provided no relief.    Past Medical History  Diagnosis Date  . AS (aortic stenosis)   . DM2 (diabetes mellitus, type 2)   . HTN (hypertension)   . Heart murmur   . Thrombocytopenia due to drugs     seen by [name] plts 114000 no rx  . Cancer     Bladder Cancer local  . Peripheral vascular disease very poor circulation legs and feet ... stents right and left legs... done in [name] 's office.   . Depression wife died 4 years ago.    Marland Kitchen COPD (chronic obstructive pulmonary disease)   . Dysrhythmia 5-7 weeks ago ... sees dr Doroteo Glassman fibrilation  . Shortness of breath   . Recurrent upper respiratory infection (URI)     sinusitis  . Neuropathy due to secondary diabetes   . Chronic kidney  disease     renal artery stent  . Claudication in peripheral vascular disease 06/17/2011  . S/P angioplasty with stent, diamond back rotational athrectomy Prox. Rt. SFA 06/17/2011 06/17/2011    Past Surgical History  Procedure Date  . Renal artery stent     2006 left renal stent  . Peripheral arterial stent graft     2006 left anf right illiac stents Dr Orson Slick  . Aortic valve replacement 01/07/2011    Procedure: AORTIC VALVE REPLACEMENT (AVR);  Surgeon: Delight Ovens, MD;  Location[facility] OR;  Service: Open Heart Surgery;  Laterality: N/A;  . Cardioversion 04/03/2011    Procedure: CARDIOVERSION;  Surgeon: Marykay Lex, MD;  Location: [facility] OR;  Service: Cardiovascular;  Laterality: N/A;  . Atherectomy 06/17/2011    balloon atherectomy    Family History  Problem Relation Age of Onset  . Heart disease Mother   . Cancer Brother   . Stroke Father     History  Substance Use Topics  . Smoking status: Current Everyday Smoker -- 2.0 packs/day for 75 years    Types: Cigarettes  . Smokeless tobacco: Never Used  . Alcohol Use: 1.2 oz/week    2 Cans of beer per week     2 beers a week      Review of Systems  Constitutional: Negative for fever, activity  change, appetite change and fatigue.  HENT: Negative for congestion, sore throat, rhinorrhea, neck pain and neck stiffness.   Respiratory: Negative for cough, chest tightness and shortness of breath.   Cardiovascular: Negative for chest pain and palpitations.  Gastrointestinal: Negative for nausea, vomiting and abdominal pain.  Genitourinary: Negative for dysuria, urgency, frequency and flank pain.  Musculoskeletal: Positive for joint swelling, arthralgias and gait problem. Negative for myalgias and back pain.  Neurological: Negative for dizziness, loss of consciousness, weakness, light-headedness, numbness and headaches.  All other systems reviewed and are negative.    Allergies  Aspirin  Home Medications   Current Outpatient  Rx  Name Route Sig Dispense Refill  . BENAZEPRIL HCL 10 MG PO TABS Oral Take 10 mg by mouth daily.    Marland Kitchen CLOPIDOGREL BISULFATE 75 MG PO TABS Oral Take 1 tablet (75 mg total) by mouth daily.    Marland Kitchen DILTIAZEM HCL ER 240 MG PO CP24 Oral Take 240 mg by mouth daily.    Marland Kitchen DOXAZOSIN MESYLATE 4 MG PO TABS Oral Take 4 mg by mouth at bedtime.    Marland Kitchen FINASTERIDE 5 MG PO TABS Oral Take 5 mg by mouth daily.      Marland Kitchen FLUTICASONE-SALMETEROL 250-50 MCG/DOSE IN AEPB Inhalation Inhale 1 puff into the lungs 2 (two) times daily. 28 each 0  . FUROSEMIDE 20 MG PO TABS Oral Take 20 mg by mouth daily.      . GLYBURIDE 5 MG PO TABS Oral Take 5-10 mg by mouth See admin instructions. 2 tablets in the morning and 1 tablet in the evening    . LORATADINE 10 MG PO TABS Oral Take 1 tablet (10 mg total) by mouth daily.    Marland Kitchen METOPROLOL TARTRATE 50 MG PO TABS Oral Take 50 mg by mouth 2 (two) times daily.    Marland Kitchen POTASSIUM CHLORIDE CRYS ER 20 MEQ PO TBCR Oral Take 20 mEq by mouth daily.    Marland Kitchen SIMVASTATIN 20 MG PO TABS Oral Take 20 mg by mouth at bedtime.      . WARFARIN SODIUM 5 MG PO TABS Oral Take 2.5 mg by mouth daily.       BP 159/60  Pulse 68  Temp(Src) 97.7 F (36.5 C) (Oral)  Resp 16  SpO2 100%  Physical Exam  Nursing note and vitals reviewed. Constitutional: He is oriented to person, place, and time. He appears well-developed and well-nourished. No distress.  HENT:  Head: Normocephalic and atraumatic.  Mouth/Throat: Oropharynx is clear and moist. No oropharyngeal exudate.  Eyes: Conjunctivae and EOM are normal. Pupils are equal, round, and reactive to light.  Neck: Normal range of motion. Neck supple.  Cardiovascular: Normal rate, regular rhythm, normal heart sounds and intact distal pulses.  Exam reveals no gallop and no friction rub.   No murmur heard. Pulmonary/Chest: Effort normal and breath sounds normal. No respiratory distress. He exhibits no tenderness.  Abdominal: Soft. Bowel sounds are normal. There is no  tenderness. There is no rebound and no guarding.  Musculoskeletal:       Right hip: He exhibits decreased range of motion, tenderness, bony tenderness and deformity.       Externally rotated but not shortened.  Hematoma to the elbow.  4cm skin tear to L elbow  Neurological: He is alert and oriented to person, place, and time. No cranial nerve deficit.  Skin: Skin is warm and dry.       Ecchymosis to the suprapubic area and L inguinal area  ED Course  Procedures (including critical care time)   Date: 06/26/2011  Rate: 78  Rhythm: atrial fibrillation  QRS Axis: normal  Intervals: normal  ST/T Wave abnormalities: normal  Conduction Disutrbances:left bundle branch block  Narrative Interpretation:   Old EKG Reviewed: unchanged  Labs Reviewed  CBC - Abnormal; Notable for the following:    WBC 14.1 (*)    RBC 4.08 (*)    Hemoglobin 12.4 (*)    HCT 36.4 (*)    RDW 16.2 (*)    Platelets 137 (*)    All other components within normal limits  DIFFERENTIAL - Abnormal; Notable for the following:    Neutrophils Relative 85 (*)    Lymphocytes Relative 7 (*)    Neutro Abs 12.0 (*)    All other components within normal limits  BASIC METABOLIC PANEL - Abnormal; Notable for the following:    Glucose, Bld 172 (*)    GFR calc non Af Amer 72 (*)    GFR calc Af Amer 84 (*)    All other components within normal limits  APTT - Abnormal; Notable for the following:    aPTT 38 (*)    All other components within normal limits  PROTIME-INR - Abnormal; Notable for the following:    Prothrombin Time 21.3 (*)    INR 1.81 (*)    All other components within normal limits   Dg Chest 1 View  06/26/2011  *RADIOLOGY REPORT*  Clinical Data:  Fall, hip pain  CHEST - 1 VIEW  Comparison: Chest radiograph 12/06/ 2012  Findings: Sternotomy wires overlie stable cardiac silhouette. There is mild air space disease in the right lower lobe.  No pleural fluid.  No pneumothorax.  No evidence of acute fracture. Remote  fracture of a left posterior lateral rib.  IMPRESSION: Concern for right lobe pneumonia.  Original Report Authenticated By: Genevive Bi, M.D.   Dg Elbow Complete Right  06/26/2011  *RADIOLOGY REPORT*  Clinical Data: Injury from fall.  Pain.  Swelling.  RIGHT ELBOW - COMPLETE 3+ VIEW  Comparison: None.  Findings: No joint effusion is evident.  Alignment is normal.  No fracture or dislocation is evident.  IMPRESSION: No fracture or dislocation.  Original Report Authenticated By: Crawford Givens, M.D.   Dg Hip Complete Right  06/26/2011  *RADIOLOGY REPORT*  Clinical Data: Status post fall.  RIGHT HIP - COMPLETE 2+ VIEW  Comparison: None  Findings: Comminuted intertrochanteric fracture of the proximal right femur is identified.  No dislocation.  No significant angulation identified.  Mild impaction of the fracture fragments noted.  Metallic stents are identified within the iliac vessels bilaterally.  IMPRESSION:  1. 1.  Comminuted, intertrochanteric fracture of the proximal right femur.  Original Report Authenticated By: Rosealee Albee, M.D.     1. Intertrochanteric fracture of right femur   2. Healthcare-associated pneumonia   3. Abrasion       MDM  Comminuted intertrochanteric fracture the right femur. He also tells care associated pneumonia after recent hospital admission. He is placed on vancomycin and Zosyn. He received a boosterix. Ancef for his wounds. Discussed with Dr. supple from orthopedics who will evaluate the patient. I discussed the case with Dr. Felipa Eth from [facility] wouldn't the patient for further evaluation and treatment.        [name] Bailiff, MD 06/26/11 [phone]

## 2011-06-26 NOTE — ED Notes (Signed)
Large area red bruising to L groin/l abd and L thigh since cardiac cath last monday

## 2011-06-26 NOTE — Consult Note (Signed)
Reason for Consult:right hip pain  Referring Physician: Avva  HPI: Ronald [name] is an 78 y.o. male who fell in parking deck while going to cardiology appt earlier today. Severe right hip pain with inability to bear weight brought to [facility] ED and found to have intertrochanteric hip fx.   Past Medical History  Diagnosis Date  . AS (aortic stenosis)   . DM2 (diabetes mellitus, type 2)   . HTN (hypertension)   . Heart murmur   . Thrombocytopenia due to drugs     seen by [name] plts 114000 no rx  . Cancer     Bladder Cancer local  . Peripheral vascular disease very poor circulation legs and feet ... stents right and left legs... done in [name] 's office.   . Depression wife died 4 years ago.    Marland Kitchen COPD (chronic obstructive pulmonary disease)   . Dysrhythmia 5-7 weeks ago ... sees dr Doroteo Glassman fibrilation  . Shortness of breath   . Recurrent upper respiratory infection (URI)     sinusitis  . Neuropathy due to secondary diabetes   . Chronic kidney disease     renal artery stent  . Claudication in peripheral vascular disease 06/17/2011  . S/P angioplasty with stent, diamond back rotational athrectomy Prox. Rt. SFA 06/17/2011 06/17/2011    Past Surgical History  Procedure Date  . Renal artery stent     2006 left renal stent  . Peripheral arterial stent graft     2006 left anf right illiac stents Dr Orson Slick  . Aortic valve replacement 01/07/2011    Procedure: AORTIC VALVE REPLACEMENT (AVR);  Surgeon: Delight Ovens, MD;  Location: [facility] OR;  Service: Open Heart Surgery;  Laterality: N/A;  . Cardioversion 04/03/2011    Procedure: CARDIOVERSION;  Surgeon: Marykay Lex, MD;  Location: [facility] OR;  Service: Cardiovascular;  Laterality: N/A;  . Atherectomy 06/17/2011    balloon atherectomy    Family History  Problem Relation Age of Onset  . Heart disease Mother   . Cancer Brother   . Stroke Father     Social History:  reports that he has been smoking Cigarettes.  He has a  150 pack-year smoking history. He has never used smokeless tobacco. He reports that he drinks about 1.2 ounces of alcohol per week. He reports that he does not use illicit drugs.  Allergies:  Allergies  Allergen Reactions  . Aspirin Shortness Of Breath and Rash    Medications: I have reviewed the patient's current medications.  Results for orders placed during the hospital encounter of 06/26/11 (from the past 48 hour(s))  CBC     Status: Abnormal   Collection Time   06/26/11  4:15 PM      Component Value Range Comment   WBC 14.1 (*) 4.0 - 10.5 (K/uL)    RBC 4.08 (*) 4.22 - 5.81 (MIL/uL)    Hemoglobin 12.4 (*) 13.0 - 17.0 (g/dL)    HCT 16.1 (*) 09.6 - 52.0 (%)    MCV 89.2  78.0 - 100.0 (fL)    MCH 30.4  26.0 - 34.0 (pg)    MCHC 34.1  30.0 - 36.0 (g/dL)    RDW 04.5 (*) 40.9 - 15.5 (%)    Platelets 137 (*) 150 - 400 (K/uL)   DIFFERENTIAL     Status: Abnormal   Collection Time   06/26/11  4:15 PM      Component Value Range Comment  Neutrophils Relative 85 (*) 43 - 77 (%)    Lymphocytes Relative 7 (*) 12 - 46 (%)    Monocytes Relative 6  3 - 12 (%)    Eosinophils Relative 2  0 - 5 (%)    Basophils Relative 0  0 - 1 (%)    Neutro Abs 12.0 (*) 1.7 - 7.7 (K/uL)    Lymphs Abs 1.0  0.7 - 4.0 (K/uL)    Monocytes Absolute 0.8  0.1 - 1.0 (K/uL)    Eosinophils Absolute 0.3  0.0 - 0.7 (K/uL)    Basophils Absolute 0.0  0.0 - 0.1 (K/uL)    Smear Review MORPHOLOGY UNREMARKABLE     BASIC METABOLIC PANEL     Status: Abnormal   Collection Time   06/26/11  4:15 PM      Component Value Range Comment   Sodium 138  135 - 145 (mEq/L)    Potassium 3.5  3.5 - 5.1 (mEq/L)    Chloride 103  96 - 112 (mEq/L)    CO2 26  19 - 32 (mEq/L)    Glucose, Bld 172 (*) 70 - 99 (mg/dL)    BUN 17  6 - 23 (mg/dL)    Creatinine, Ser 0.45  0.50 - 1.35 (mg/dL)    Calcium 8.9  8.4 - 10.5 (mg/dL)    GFR calc non Af Amer 72 (*) >90 (mL/min)    GFR calc Af Amer 84 (*) >90 (mL/min)   APTT     Status: Abnormal    Collection Time   06/26/11  4:15 PM      Component Value Range Comment   aPTT 38 (*) 24 - 37 (seconds)   PROTIME-INR     Status: Abnormal   Collection Time   06/26/11  4:15 PM      Component Value Range Comment   Prothrombin Time 21.3 (*) 11.6 - 15.2 (seconds)    INR 1.81 (*) 0.00 - 1.49      Dg Chest 1 View  06/26/2011  *RADIOLOGY REPORT*  Clinical Data:  Fall, hip pain  CHEST - 1 VIEW  Comparison: Chest radiograph 12/06/ 2012  Findings: Sternotomy wires overlie stable cardiac silhouette. There is mild air space disease in the right lower lobe.  No pleural fluid.  No pneumothorax.  No evidence of acute fracture. Remote fracture of a left posterior lateral rib.  IMPRESSION: Concern for right lobe pneumonia.  Original Report Authenticated By: Genevive Bi, M.D.   Dg Elbow Complete Right  06/26/2011  *RADIOLOGY REPORT*  Clinical Data: Injury from fall.  Pain.  Swelling.  RIGHT ELBOW - COMPLETE 3+ VIEW  Comparison: None.  Findings: No joint effusion is evident.  Alignment is normal.  No fracture or dislocation is evident.  IMPRESSION: No fracture or dislocation.  Original Report Authenticated By: Crawford Givens, M.D.   Dg Hip Complete Right  06/26/2011  *RADIOLOGY REPORT*  Clinical Data: Status post fall.  RIGHT HIP - COMPLETE 2+ VIEW  Comparison: None  Findings: Comminuted intertrochanteric fracture of the proximal right femur is identified.  No dislocation.  No significant angulation identified.  Mild impaction of the fracture fragments noted.  Metallic stents are identified within the iliac vessels bilaterally.  IMPRESSION:  1. 1.  Comminuted, intertrochanteric fracture of the proximal right femur.  Original Report Authenticated By: Rosealee Albee, M.D.    ROS: recent balloon procedure in same leg according to family/pt   Physical Exam: General appearance: obvious discomfort, alert and oriented Right LE NVI  Vitals Temp:  [97.7 F (36.5 C)] 97.7 F (36.5 C) (05/01 1541) Pulse Rate:  [68] 68   (05/01 1541) Resp:  [16] 16  (05/01 1541) BP: (159)/(60-90) 159/60 mmHg (05/01 1541) SpO2:  [100 %] 100 % (05/01 1541)  Assessment/Plan: Impression: Right IT hip fx, in patient with cardiac and vascular disease on chronic Coumadin Treatment: hold coumadin. His current INR is 1.8 and he has not taken todays dose. We have tentative plans for ORIF R hip in AM if cleared by medicine. Will keep npo after midnight and order traction and prn meds for comfort.  [facility] for Dr. Francena Hanly 06/26/2011, 7:02 PM

## 2011-06-26 NOTE — Progress Notes (Signed)
ED CM noted CM consult for home health needs CM spoke with pt and daughters at bedside who chose [facility]) for services and DME Chooses to have them follow him in hospital for any d/c needs CM notified Mardella Layman in house [facility] coordinator via phone

## 2011-06-26 NOTE — ED Notes (Signed)
Patient transported to X-ray 

## 2011-06-26 NOTE — Progress Notes (Signed)
ANTIBIOTIC CONSULT NOTE - INITIAL  Pharmacy Consult for Zosyn Indication: pneumonia  Allergies  Allergen Reactions  . Aspirin Shortness Of Breath and Rash    Patient Measurements:    Vital Signs: Temp: 97.7 F (36.5 C) (05/01 1541) Temp src: Oral (05/01 1535) BP: 152/73 mmHg (05/01 1929) Pulse Rate: 72  (05/01 1929) Intake/Output from previous day:   Intake/Output from this shift:    Labs:  Basename 06/26/11 1615  WBC 14.1*  HGB 12.4*  PLT 137*  LABCREA --  CREATININE 0.96   The CrCl is unknown because both a height and weight (above a minimum accepted value) are required for this calculation. No results found for this basename: VANCOTROUGH:2,VANCOPEAK:2,VANCORANDOM:2,GENTTROUGH:2,GENTPEAK:2,GENTRANDOM:2,TOBRATROUGH:2,TOBRAPEAK:2,TOBRARND:2,AMIKACINPEAK:2,AMIKACINTROU:2,AMIKACIN:2, in the last 72 hours   Microbiology: No results found for this or any previous visit (from the past 720 hour(s)).  Medical History: Past Medical History  Diagnosis Date  . AS (aortic stenosis)   . DM2 (diabetes mellitus, type 2)   . HTN (hypertension)   . Heart murmur   . Thrombocytopenia due to drugs     seen by [name] plts [id number] no rx  . Cancer     Bladder Cancer local  . Peripheral vascular disease very poor circulation legs and feet ... stents right and left legs... done in [name] 's office.   . Depression wife died 4 years ago.    Marland Kitchen COPD (chronic obstructive pulmonary disease)   . Dysrhythmia 5-7 weeks ago ... sees dr Doroteo Glassman fibrilation  . Shortness of breath   . Recurrent upper respiratory infection (URI)     sinusitis  . Neuropathy due to secondary diabetes   . Chronic kidney disease     renal artery stent  . Claudication in peripheral vascular disease 06/17/2011  . S/P angioplasty with stent, diamond back rotational athrectomy Prox. Rt. SFA 06/17/2011 06/17/2011   Medications:  Prescriptions prior to admission  Medication Sig Dispense Refill  .  benazepril (LOTENSIN) 10 MG tablet Take 10 mg by mouth daily.      . clopidogrel (PLAVIX) 75 MG tablet Take 1 tablet (75 mg total) by mouth daily.      Marland Kitchen diltiazem (DILACOR XR) 240 MG 24 hr capsule Take 240 mg by mouth daily.      Marland Kitchen doxazosin (CARDURA) 4 MG tablet Take 4 mg by mouth at bedtime.      . finasteride (PROSCAR) 5 MG tablet Take 5 mg by mouth daily.        . Fluticasone-Salmeterol (ADVAIR) 250-50 MCG/DOSE AEPB Inhale 1 puff into the lungs 2 (two) times daily.  28 each  0  . furosemide (LASIX) 20 MG tablet Take 20 mg by mouth daily.        Marland Kitchen glyBURIDE (DIABETA) 5 MG tablet Take 5-10 mg by mouth See admin instructions. 2 tablets in the morning and 1 tablet in the evening      . loratadine (CLARITIN) 10 MG tablet Take 1 tablet (10 mg total) by mouth daily.      . metoprolol (LOPRESSOR) 50 MG tablet Take 50 mg by mouth 2 (two) times daily.      . potassium chloride SA (K-DUR,KLOR-CON) 20 MEQ tablet Take 20 mEq by mouth daily.      . simvastatin (ZOCOR) 20 MG tablet Take 20 mg by mouth at bedtime.        Marland Kitchen warfarin (COUMADIN) 5 MG tablet Take 2.5 mg by mouth daily.  Anti-infectives     Start     Dose/Rate Route Frequency Ordered Stop   06/26/11 1930   ceFAZolin (ANCEF) IVPB 2 g/50 mL premix        2 g 100 mL/hr over 30 Minutes Intravenous 60 min pre-op 06/26/11 1930     06/26/11 1800   vancomycin (VANCOCIN) IVPB 1000 mg/200 mL premix        1,000 mg 200 mL/hr over 60 Minutes Intravenous  Once 06/26/11 1746     06/26/11 1800   piperacillin-tazobactam (ZOSYN) IVPB 3.375 g        3.375 g 100 mL/hr over 30 Minutes Intravenous  Once 06/26/11 1746 06/26/11 2023   06/26/11 1615   ceFAZolin (ANCEF) IVPB 1 g/50 mL premix        1 g 100 mL/hr over 30 Minutes Intravenous  Once 06/26/11 1601 06/26/11 1855         Assessment:  78yo M on chronic Coumadin, fell and fractured hip. Surgical repair planned when medically cleared.  Received Vanc and Zosyn doses in the ER. Continuing  Zosyn empirically for suspected pneumonia.  SCr 1, CrCl 55N.  Goal of Therapy:  Renally-appropriate dosing.  Plan:  Zosyn 3.375g IV Q8H infused over 4hrs. F/u daily.  Reece Packer 06/26/2011,9:36 PM

## 2011-06-26 NOTE — ED Notes (Signed)
Pt fell in parking lot landing on L elbow and rolled to R hip. Pt has 4cm skin tear to L elbow, hematoma to R elbow and c/o R hip pain with inability to move R leg.

## 2011-06-26 NOTE — ED Notes (Signed)
Bed:WA12<BR> Expected date:<BR> Expected time:<BR> Means of arrival:<BR> Comments:<BR> EMS

## 2011-06-26 NOTE — ED Notes (Signed)
Pt was going to MD, fell in parking lot. Skin tear to L elbow. Pt had cardiac cath last Monday with stent placement.

## 2011-06-26 NOTE — H&P (Signed)
PCP:   [name], MD, MD   Chief Complaint:  Right hip pain status post fall  HPI: This is an 78 year old Caucasian male with multiple medical problems including recent bioprosthetic aortic valve replacement in November, 2012 for critical aortic stenosis, with associated cardiac catheterization revealing normal coronary arteries and normal LV function however patient does have significant issues with peripheral vascular disease and indeed went under atherectomy in the right lower extremity within the last 2 weeks by Dr. Gery Pray of [facility]. He also has ongoing COPD with tobacco abuse, treated hypertension, dyslipidemia treated with statin therapy, as well as type 2 diabetes managed by primary care physician, Dr. Timothy Lasso. He also has atrial fibrillation managed with anticoagulation with Coumadin, no bleeding complications noted. He has participated with cardiac rehabilitation and is supported in conjunction by history children who live fairly locally. Recently had significant claudication type symptoms and underwent balloon atherectomy of the right lower extremity by cardiology. That took place approximately 9 days ago. He was going to see cardiology today for followup when he had a fall without loss of consciousness resulting in right femur fracture. Presented to the emergency room unable to ambulate   Review of Systems:  Patient denies any recent issues with fevers, chills other than that associated with his chronic Coumadin use, chest pain, worsening shortness of breath, palpitations, states her claudication symptoms are better, denies blood in stool or urine, denies change in bowel habits, denies diarrhea, denies nausea, vomiting or uncontrolled reflux. He denies any focal neurological deficits but complains of his right hip discomfort. Denies any overt bronchospasm or wheezing, or significant cough. He has had some recent issues with allergic rhinitis and postnasal drip  and nasal congestion  Past Medical History: Past Medical History  Diagnosis Date  . AS (aortic stenosis)   . DM2 (diabetes mellitus, type 2)   . HTN (hypertension)   . Heart murmur   . Thrombocytopenia due to drugs     seen by [name] plts [id number] no rx  . Cancer     Bladder Cancer local  . Peripheral vascular disease very poor circulation legs and feet ... stents right and left legs... done in [name] 's office.   . Depression wife died 4 years ago.    Marland Kitchen COPD (chronic obstructive pulmonary disease)   . Dysrhythmia 5-7 weeks ago ... sees dr Doroteo Glassman fibrilation  . Shortness of breath   . Recurrent upper respiratory infection (URI)     sinusitis  . Neuropathy due to secondary diabetes   . Chronic kidney disease     renal artery stent  . Claudication in peripheral vascular disease 06/17/2011  . S/P angioplasty with stent, diamond back rotational athrectomy Prox. Rt. SFA 06/17/2011 06/17/2011    DM2 with Peripheral Neuropathy, Microalbuminuria/Vasular Complications,  CAD,  Severe PAD/Chronic leg Pain/Claudication with PCI/stenting to B SFA 2006 Current ABIs @ .5,   RAS S/P PCI,   Hyperlipidemia,   Severe AS in Pt with H/O Rheumatic Heart Disease (Rheumatic Fever 1946 S/P 3 mon in hospital) - S/P AVR,   H/O Renal Cyst,   Divertisculosis.   CRI/CKD Cr 1.2-1.5 Cr Cl 62.  COPD/Tobacco abuse,    thrombocytopenia.     OA/Shoulder Pain.   BPH,   H/O Bladder Carcinoma - high grade urothelial carcinoma (high grade papillary urothelial carcinoma vs carcinoma in situ)  S/P Operation and BCG,  ED, Depression and Grief after his wife died  T12 comp F(x) due to trauma 1995 Peripheral Edema/Venous stasis doing well with stockings Skin Cancer removed L Hand 03/2010 Dr Terri Piedra. AFib RVR 02/2011     Past Surgical History  Procedure Date  . Renal artery stent     2006 left renal stent  . Peripheral arterial stent graft     2006 left anf right illiac stents Dr Orson Slick  . Aortic valve  replacement 01/07/2011    Procedure: AORTIC VALVE REPLACEMENT (AVR);  Surgeon: Delight Ovens, MD;  Location: [facility] OR;  Service: Open Heart Surgery;  Laterality: N/A;  . Cardioversion 04/03/2011    Procedure: CARDIOVERSION;  Surgeon: Marykay Lex, MD;  [facility];  Service: Cardiovascular;  Laterality: N/A;  . Atherectomy 06/17/2011    balloon atherectomy  03/24/02 PVD/PAD AVR 01/07/11 R common, Right Ex illiac, PCA & Stents L common, L Ex illiac RAS T &A Nasal Polypectomy B Cataract Bladder Cancer S/P 06/13/08 Cystoscopy with transurethral resection of bladder lesion and random bladder biopsies.                  Local Excision BCG Rx 5/26  Medications: Prior to Admission medications   Medication Sig Start Date End Date Taking? Authorizing Provider  benazepril (LOTENSIN) 10 MG tablet Take 10 mg by mouth daily.   Yes Historical Provider, MD  clopidogrel (PLAVIX) 75 MG tablet Take 1 tablet (75 mg total) by mouth daily. 01/17/11 01/17/12 Yes Donielle Margaretann Loveless, PA  diltiazem (DILACOR XR) 240 MG 24 hr capsule Take 240 mg by mouth daily.   Yes Historical Provider, MD  doxazosin (CARDURA) 4 MG tablet Take 4 mg by mouth at bedtime. 01/10/11  Yes Wilmon Pali, PA  finasteride (PROSCAR) 5 MG tablet Take 5 mg by mouth daily.     Yes Historical Provider, MD  Fluticasone-Salmeterol (ADVAIR) 250-50 MCG/DOSE AEPB Inhale 1 puff into the lungs 2 (two) times daily. 01/17/11  Yes Donielle Margaretann Loveless, PA  furosemide (LASIX) 20 MG tablet Take 20 mg by mouth daily.     Yes Historical Provider, MD  glyBURIDE (DIABETA) 5 MG tablet Take 5-10 mg by mouth See admin instructions. 2 tablets in the morning and 1 tablet in the evening   Yes Historical Provider, MD  loratadine (CLARITIN) 10 MG tablet Take 1 tablet (10 mg total) by mouth daily. 01/10/11  Yes Wilmon Pali, PA  metoprolol (LOPRESSOR) 50 MG tablet Take 50 mg by mouth 2 (two) times daily.   Yes Historical Provider, MD  potassium chloride SA  (K-DUR,KLOR-CON) 20 MEQ tablet Take 20 mEq by mouth daily.   Yes Historical Provider, MD  simvastatin (ZOCOR) 20 MG tablet Take 20 mg by mouth at bedtime.     Yes Historical Provider, MD  warfarin (COUMADIN) 5 MG tablet Take 2.5 mg by mouth daily.    Yes Historical Provider, MD    Allergies:   Allergies  Allergen Reactions  . Aspirin Shortness Of Breath and Rash    Social History:  reports that he has been smoking Cigarettes.  He has a 150 pack-year smoking history. He has never used smokeless tobacco. He reports that he drinks about 1.2 ounces of alcohol per week. He reports that he does not use illicit drugs. Patient of [facility] since 1995.  Patient is widowed, 3 chidlren, 10 grandchildren, 1 great grandchild. Wife was Corrie Dandy also pt of GMA.  Patient smokes 2 ppd tobacco, rarely drinks alcohol. Family History: Family History  Problem Relation Age of Onset  .  Heart disease Mother   . Cancer Brother   . Stroke Father   Father died at age 30 CVA Mother died at age 49 CVA, aneurysm Fm Hx colon cancer  Physical Exam: Filed Vitals:   06/26/11 1535 06/26/11 1541  BP: 159/90 159/60  Pulse: 68 68  Temp: 97.7 F (36.5 C) 97.7 F (36.5 C)  TempSrc: Oral   Resp: 16 16  SpO2: 100% 100%    Well-nourished, well-hydrated, oriented to person place and time, appropriate Face symmetric, no asymmetries, Bilateral hearing aids Poor dentition from smoking, otherwise no oropharyngeal lesions Neck supple, no cervical lymphadenopathy, no carotid bruits appreciated Irregularly irregular, no rubs or gallops Carotid arteries symmetric no bruits,  lungs clear to auscultation bilaterally no accessory muscle usage Abdomen soft, nontender, nondistended Extremities revealed trace to no edema, Decreased pedal pulses, no cyanosis Decrease sensation to skin but intact Tenderness to range of motion with right hip, can move left lower extremity however ecchymoses in the left inguinal area Bruising and hematoma  to the left elbow   Labs on Admission:   [facility] 06/26/11 1615  NA 138  K 3.5  CL 103  CO2 26  GLUCOSE 172*  BUN 17  CREATININE 0.96  CALCIUM 8.9  MG --  PHOS --   No results found for this basename: AST:2,ALT:2,ALKPHOS:2,BILITOT:2,PROT:2,ALBUMIN:2 in the last 72 hours No results found for this basename: LIPASE:2,AMYLASE:2 in the last 72 hours  Basename 06/26/11 1615  WBC 14.1*  NEUTROABS 12.0*  HGB 12.4*  HCT 36.4*  MCV 89.2  PLT 137*   No results found for this basename: CKTOTAL:3,CKMB:3,CKMBINDEX:3,TROPONINI:3 in the last 72 hours No results found for this basename: TSH,T4TOTAL,FREET3,T3FREE,THYROIDAB in the last 72 hours No results found for this basename: VITAMINB12:2,FOLATE:2,FERRITIN:2,TIBC:2,IRON:2,RETICCTPCT:2 in the last 72 hours   Labs from June 05, 2011  (1) LIPID (2105)   CHOLESTEROL          [L]  82 mg/dl                    161-096   TRIGLYCERIDES             46 mg/dl                    04-540   HDL                  [L]  31 mg/dl                    98-11   LDL                       42 mg/dl   CHOL/HDL RATIO            2.6 RATIO        LDL/HDL RATIO        [L]  1.4                         1.7-2.5   NON-HDL                   51.0  Tests: (2) CBC (2000)   WBC                       7.90 K/uL                   4.10-10.90   LYM  1.8 K/uL                    0.6-4.1 ! MID                       0.8 K/uL                    0.0-1.8   GRAN                      5.3 K/uL                    2.0-7.8   LYM%                      22.8 %                      10.0-58.5 ! MID%                      9.8 %                       0.1-24.0   GRAN%                     67.4                        37.0-92.0   RBC                       4.2 M/uL                    4.2-6.3     l=   HGB                       13.0 g/dL                   16.1-09.6   HCT                       38.2 %                      37.0-51.0   MCV                       92.0 fL                      80.0-97.0   MCH                       31.3 pg                     26.0-32.0   MCHC                      34.0 g/dL                   04.5-40.9   PLT                  [L]  108 K/uL                    140-440     l=  Tests: (3) COMPLETE  METABOLIC (1010)   GLUCOSE              [H]  246 mg/dl                   02-725   BUN                       22 mg/dl                    3-66   CREATININE                1.1 mg/dl                   4.4-0.3  eGFR Non-African American                             63.3  eGFR African American                             76.6   SODIUM                    141 mEq/L                   135-148   POTASSIUM                 3.6 mEq/L                   3.5-5.3   CHLORIDE                  108 mEq/L                   80-111   CO2                       29 mEq/L                    15-35   CALCIUM                   9.3 mg/dL                   4.7-42.5   TOTAL PROTEIN             6.3 g/dL                    9.5-6.3   ALBUMIN                   3.5 g/dL                    8.7-5.6   AST                       18 IU/L                     7-45   ALT                       18 IU/L                     5-40   ALK PHOS  87 IU/L                     37-137   TOTAL BILIRUBIN           0.9 mg/dl                   2.5-9.5 Radiological Exams on Admission: Dg Chest 1 View  06/26/2011  *RADIOLOGY REPORT*  Clinical Data:  Fall, hip pain  CHEST - 1 VIEW  Comparison: Chest radiograph 12/06/ 2012  Findings: Sternotomy wires overlie stable cardiac silhouette. There is mild air space disease in the right lower lobe.  No pleural fluid.  No pneumothorax.  No evidence of acute fracture. Remote fracture of a left posterior lateral rib.  IMPRESSION: Concern for right lobe pneumonia.  Original Report Authenticated By: Genevive Bi, M.D.   Dg Elbow Complete Right  06/26/2011  *RADIOLOGY REPORT*  Clinical Data: Injury from fall.  Pain.  Swelling.  RIGHT ELBOW - COMPLETE 3+ VIEW   Comparison: None.  Findings: No joint effusion is evident.  Alignment is normal.  No fracture or dislocation is evident.  IMPRESSION: No fracture or dislocation.  Original Report Authenticated By: Crawford Givens, M.D.   Dg Hip Complete Right  06/26/2011  *RADIOLOGY REPORT*  Clinical Data: Status post fall.  RIGHT HIP - COMPLETE 2+ VIEW  Comparison: None  Findings: Comminuted intertrochanteric fracture of the proximal right femur is identified.  No dislocation.  No significant angulation identified.  Mild impaction of the fracture fragments noted.  Metallic stents are identified within the iliac vessels bilaterally.  IMPRESSION:  1. 1.  Comminuted, intertrochanteric fracture of the proximal right femur.  Original Report Authenticated By: Rosealee Albee, M.D.   Orders placed during the hospital encounter of 06/26/11  . ED EKG  . ED EKG  . EKG 12-LEAD  . EKG 12-LEAD  . EKG 12-LEAD  . EKG 12-LEAD   A. fib  Assessment/Plan Right hip fracture-scheduled for fixation tomorrow morning by Dr. supple, patient hemodynamically stable despite peripheral vascular disease, atrial correlation which are all chronic, Coumadin held tonight, reversal per orthopedics and anesthesiology. History of aortic valve replacement-hemodynamically stable, no evidence of volume overload or congestive heart failure-cardiology consult called for postoperative assistance Atrial for ablation, well rate controlled, currently on anticoagulation with subtherapeutic PT/INR, on hold for surgery, continue beta blocker and calcium channel blocker Type 2 diabetes, will continue sliding scale insulin, last hemoglobin A1c 8.1% within the last one month, complicated by vascular disease and potential neuropathy Tobacco abuse, no evidence of bronchospasm on physical exam, currently on Advair inhaler, will continue the same, we'll watch the need for nebulizer treatment and pulmonary toilet status post surgery Hypertension controlled, will hold ACE  inhibitor perioperatively Pneumonia-?, Clinically asymptomatic per patient and family report, will continue Zosyn, patient did receive vancomycin and is scheduled to receive Ancef perioperatively, will probably get chest x-ray postoperatively for further evaluation and management and the questionable need for further antibiotics Hyperlipidemia on Zocor Peripheral vascular disease on Plavix, complicated by smoking, followed by Dr. Clent Demark R 06/26/2011, 7:03 PM

## 2011-06-27 ENCOUNTER — Other Ambulatory Visit: Payer: Self-pay

## 2011-06-27 ENCOUNTER — Inpatient Hospital Stay (HOSPITAL_COMMUNITY): Payer: Medicare Other

## 2011-06-27 ENCOUNTER — Encounter (HOSPITAL_COMMUNITY): Payer: Self-pay | Admitting: Cardiology

## 2011-06-27 ENCOUNTER — Encounter (HOSPITAL_COMMUNITY): Payer: Self-pay | Admitting: Certified Registered Nurse Anesthetist

## 2011-06-27 ENCOUNTER — Inpatient Hospital Stay (HOSPITAL_COMMUNITY): Payer: Medicare Other | Admitting: Certified Registered Nurse Anesthetist

## 2011-06-27 ENCOUNTER — Encounter (HOSPITAL_COMMUNITY)
Admission: EM | Disposition: A | Discharge status: Skilled Nursing Facility | Payer: Self-pay | Source: Home / Self Care | Attending: Internal Medicine

## 2011-06-27 DIAGNOSIS — S72001A Fracture of unspecified part of neck of right femur, initial encounter for closed fracture: Secondary | ICD-10-CM | POA: Diagnosis present

## 2011-06-27 HISTORY — PX: FEMUR IM NAIL: SHX1597

## 2011-06-27 LAB — GLUCOSE, CAPILLARY: Glucose-Capillary: 216 mg/dL — ABNORMAL HIGH (ref 70–99)

## 2011-06-27 SURGERY — INSERTION, INTRAMEDULLARY ROD, FEMUR
Anesthesia: General | Laterality: Right | Wound class: Clean

## 2011-06-27 MED ORDER — SUCCINYLCHOLINE CHLORIDE 20 MG/ML IJ SOLN
INTRAMUSCULAR | Status: DC | PRN
  Administered 2011-06-27: 100 mg via INTRAVENOUS

## 2011-06-27 MED ORDER — ONDANSETRON HCL 4 MG PO TABS: 4.0000 mg | ORAL_TABLET | Freq: Four times a day (QID) | ORAL | Status: DC | PRN

## 2011-06-27 MED ORDER — ONDANSETRON HCL 4 MG/2ML IJ SOLN: 4.0000 mg | Freq: Four times a day (QID) | INTRAMUSCULAR | Status: DC | PRN

## 2011-06-27 MED ORDER — ONDANSETRON HCL 4 MG/2ML IJ SOLN
INTRAMUSCULAR | Status: DC | PRN
  Administered 2011-06-27: 4 mg via INTRAVENOUS

## 2011-06-27 MED ORDER — METOCLOPRAMIDE HCL 5 MG/ML IJ SOLN
5.0000 mg | Freq: Three times a day (TID) | INTRAMUSCULAR | Status: DC | PRN
Start: 2011-06-27 — End: 2011-07-02

## 2011-06-27 MED ORDER — HYDROCODONE-ACETAMINOPHEN 5-325 MG PO TABS
1.0000 | ORAL_TABLET | ORAL | Status: DC | PRN
  Administered 2011-06-27 – 2011-06-29 (×3): 2 via ORAL
  Administered 2011-06-30: 1 via ORAL
  Administered 2011-07-01 – 2011-07-02 (×3): 2 via ORAL
  Filled 2011-06-27: qty 1
  Filled 2011-06-27 (×4): qty 2
  Filled 2011-06-27: qty 1
  Filled 2011-06-27 (×2): qty 2

## 2011-06-27 MED ORDER — NEOSTIGMINE METHYLSULFATE 1 MG/ML IJ SOLN
INTRAMUSCULAR | Status: DC | PRN
  Administered 2011-06-27: 4 mg via INTRAVENOUS

## 2011-06-27 MED ORDER — ACETAMINOPHEN 650 MG RE SUPP: 650.0000 mg | Freq: Four times a day (QID) | RECTAL | Status: DC | PRN

## 2011-06-27 MED ORDER — GLYCOPYRROLATE 0.2 MG/ML IJ SOLN
INTRAMUSCULAR | Status: DC | PRN
  Administered 2011-06-27: .6 mg via INTRAVENOUS

## 2011-06-27 MED ORDER — ACETAMINOPHEN 325 MG PO TABS: 650.0000 mg | ORAL_TABLET | Freq: Four times a day (QID) | ORAL | Status: DC | PRN

## 2011-06-27 MED ORDER — LACTATED RINGERS IV SOLN
INTRAVENOUS | Status: DC
  Administered 2011-06-27: 75 mL/h via INTRAVENOUS

## 2011-06-27 MED ORDER — 0.9 % SODIUM CHLORIDE (POUR BTL) OPTIME
TOPICAL | Status: DC | PRN
  Administered 2011-06-27: 1000 mL

## 2011-06-27 MED ORDER — FENTANYL CITRATE 0.05 MG/ML IJ SOLN: 25.0000 ug | INTRAMUSCULAR | Status: DC | PRN

## 2011-06-27 MED ORDER — ROCURONIUM BROMIDE 100 MG/10ML IV SOLN
INTRAVENOUS | Status: DC | PRN
  Administered 2011-06-27: 30 mg via INTRAVENOUS

## 2011-06-27 MED ORDER — LIDOCAINE HCL (CARDIAC) 20 MG/ML IV SOLN
INTRAVENOUS | Status: DC | PRN
  Administered 2011-06-27: 100 mg via INTRAVENOUS

## 2011-06-27 MED ORDER — MENTHOL 3 MG MT LOZG
1.0000 | LOZENGE | OROMUCOSAL | Status: DC | PRN
  Filled 2011-06-27: qty 9

## 2011-06-27 MED ORDER — WARFARIN SODIUM 2.5 MG PO TABS
2.5000 mg | ORAL_TABLET | Freq: Once | ORAL | Status: DC
  Filled 2011-06-27: qty 1

## 2011-06-27 MED ORDER — PROMETHAZINE HCL 25 MG/ML IJ SOLN: 6.2500 mg | INTRAMUSCULAR | Status: DC | PRN

## 2011-06-27 MED ORDER — WARFARIN - PHARMACIST DOSING INPATIENT: Freq: Every day | Status: DC

## 2011-06-27 MED ORDER — FENTANYL CITRATE 0.05 MG/ML IJ SOLN
INTRAMUSCULAR | Status: DC | PRN
  Administered 2011-06-27: 50 ug via INTRAVENOUS
  Administered 2011-06-27: 25 ug via INTRAVENOUS
  Administered 2011-06-27: 50 ug via INTRAVENOUS
  Administered 2011-06-27: 25 ug via INTRAVENOUS

## 2011-06-27 MED ORDER — PHENOL 1.4 % MT LIQD
1.0000 | OROMUCOSAL | Status: DC | PRN
  Filled 2011-06-27: qty 177

## 2011-06-27 MED ORDER — INSULIN GLARGINE 100 UNIT/ML ~~LOC~~ SOLN
10.0000 [IU] | Freq: Every day | SUBCUTANEOUS | Status: DC
  Administered 2011-06-27 – 2011-06-28 (×2): 10 [IU] via SUBCUTANEOUS

## 2011-06-27 MED ORDER — PHENYLEPHRINE HCL 10 MG/ML IJ SOLN
10.0000 mg | INTRAVENOUS | Status: DC | PRN
  Administered 2011-06-27: 50 ug/min via INTRAVENOUS

## 2011-06-27 MED ORDER — ETOMIDATE 2 MG/ML IV SOLN
INTRAVENOUS | Status: DC | PRN
  Administered 2011-06-27: 12 mg via INTRAVENOUS

## 2011-06-27 MED ORDER — VANCOMYCIN HCL IN DEXTROSE 1-5 GM/200ML-% IV SOLN
1000.0000 mg | Freq: Two times a day (BID) | INTRAVENOUS | Status: AC
  Administered 2011-06-27: 1000 mg via INTRAVENOUS
  Filled 2011-06-27: qty 200

## 2011-06-27 MED ORDER — LACTATED RINGERS IV SOLN
INTRAVENOUS | Status: DC | PRN
  Administered 2011-06-27: 07:00:00 via INTRAVENOUS

## 2011-06-27 MED ORDER — EPHEDRINE SULFATE 50 MG/ML IJ SOLN
INTRAMUSCULAR | Status: DC | PRN
  Administered 2011-06-27 (×2): 10 mg via INTRAVENOUS

## 2011-06-27 MED ORDER — METOCLOPRAMIDE HCL 10 MG PO TABS: 5.0000 mg | ORAL_TABLET | Freq: Three times a day (TID) | ORAL | Status: DC | PRN

## 2011-06-27 SURGICAL SUPPLY — 34 items
BAG SPEC THK2 15X12 ZIP CLS (MISCELLANEOUS) ×1
BAG ZIPLOCK 12X15 (MISCELLANEOUS) ×2 IMPLANT
BIT DRILL CANN LG 4.3MM (BIT) IMPLANT
BLADE SURG 15 STRL LF DISP TIS (BLADE) ×1 IMPLANT
BLADE SURG 15 STRL SS (BLADE) ×2
CLOTH BEACON ORANGE TIMEOUT ST (SAFETY) ×2 IMPLANT
CLSR STERI-STRIP ANTIMIC 1/2X4 (GAUZE/BANDAGES/DRESSINGS) ×1 IMPLANT
DRAPE STERI IOBAN 125X83 (DRAPES) ×2 IMPLANT
DRILL BIT CANN LG 4.3MM (BIT) ×2
DRSG EMULSION OIL 3X3 NADH (GAUZE/BANDAGES/DRESSINGS) ×2 IMPLANT
DRSG MEPILEX BORDER 4X4 (GAUZE/BANDAGES/DRESSINGS) ×1 IMPLANT
DRSG MEPILEX BORDER 4X8 (GAUZE/BANDAGES/DRESSINGS) ×1 IMPLANT
DRSG PAD ABDOMINAL 8X10 ST (GAUZE/BANDAGES/DRESSINGS) ×2 IMPLANT
DURAPREP 26ML APPLICATOR (WOUND CARE) ×2 IMPLANT
ELECT REM PT RETURN 9FT ADLT (ELECTROSURGICAL) ×2
ELECTRODE REM PT RTRN 9FT ADLT (ELECTROSURGICAL) ×1 IMPLANT
GLOVE BIO SURGEON STRL SZ7.5 (GLOVE) ×2 IMPLANT
GLOVE BIO SURGEON STRL SZ8 (GLOVE) ×2 IMPLANT
GLOVE ECLIPSE 7.5 STRL STRAW (GLOVE) ×2 IMPLANT
GLOVE EUDERMIC 7 POWDERFREE (GLOVE) ×2 IMPLANT
GUIDEPIN 3.2X17.5 THRD DISP (PIN) ×1 IMPLANT
GUIDEWIRE BALL NOSE 100CM (WIRE) ×1 IMPLANT
KIT BASIN OR (CUSTOM PROCEDURE TRAY) ×2 IMPLANT
MANIFOLD NEPTUNE II (INSTRUMENTS) ×2 IMPLANT
NAIL HIP FRACT 130D 9X180 (Orthopedic Implant) ×1 IMPLANT
PACK GENERAL/GYN (CUSTOM PROCEDURE TRAY) ×2 IMPLANT
POSITIONER SURGICAL ARM (MISCELLANEOUS) ×2 IMPLANT
SCREW BONE CORTICAL 5.0X3 (Screw) ×1 IMPLANT
SCREW LAG 10.5MMX105MM HFN (Screw) ×1 IMPLANT
SPONGE GAUZE 4X4 12PLY (GAUZE/BANDAGES/DRESSINGS) ×2 IMPLANT
SPONGE LAP 4X18 X RAY DECT (DISPOSABLE) ×2 IMPLANT
SUT MNCRL AB 3-0 PS2 18 (SUTURE) IMPLANT
SUT VIC AB 0 CT1 36 (SUTURE) ×2 IMPLANT
SUT VIC AB 2-0 CT1 36 (SUTURE) ×4 IMPLANT

## 2011-06-27 NOTE — Anesthesia Postprocedure Evaluation (Signed)
  Anesthesia Post-op Note  Patient: Ronald [name]  Procedure(s) Performed: Procedure(s) (LRB): INTRAMEDULLARY (IM) NAIL FEMORAL (Right)  Patient Location: PACU  Anesthesia Type: General  Level of Consciousness: awake and alert   Airway and Oxygen Therapy: Patient Spontanous Breathing  Post-op Pain: mild  Post-op Assessment: Post-op Vital signs reviewed, Patient's Cardiovascular Status Stable, Respiratory Function Stable, Patent Airway and No signs of Nausea or vomiting  Post-op Vital Signs: stable  Complications: No apparent anesthesia complications

## 2011-06-27 NOTE — Op Note (Signed)
06/26/2011 - 06/27/2011  8:56 AM  PATIENT:   Margaret Pyle  78 y.o. male  PRE-OPERATIVE DIAGNOSIS:  fractured right hip  POST-OPERATIVE DIAGNOSIS:  Right intertrochanteric hip fracture   PROCEDURE:  ORIF with IMHS  SURGEON:  Kayvan Hoefling, Vania Rea M.D.  ASSISTANTS: Shuford pac   ANESTHESIA:   GET  EBL: 500cc  SPECIMEN:  none  Drains: none   PATIENT DISPOSITION:  PACU - hemodynamically stable.    PLAN OF CARE: Admit to inpatient   Dictation# ?

## 2011-06-27 NOTE — Plan of Care (Signed)
Problem: Phase I Progression Outcomes Goal: Pre op NPO per MD orders Outcome: Completed/Met Date Met:  06/27/11 NPO after midnight

## 2011-06-27 NOTE — Consult Note (Signed)
Reason for Consult:  Post op assistance with management   Referring Physician: Dr. Teodoro Kil is an 78 y.o. male.    Chief Complaint: Rt. Hip pain after a fall.   HPI: This is an 78 year old Caucasian male with multiple medical problems including recent bioprosthetic aortic valve replacement in November, 2012 for critical aortic stenosis, with associated cardiac catheterization revealing normal coronary arteries and normal LV function however patient does have significant issues with peripheral vascular disease and indeed went under atherectomy in the right lower extremity within the last 2 weeks by Dr. Allyson Sabal of [facility]. He also has ongoing COPD with tobacco abuse, treated hypertension, dyslipidemia treated with statin therapy, as well as type 2 diabetes managed by primary care physician, Dr. Timothy Lasso. He also has atrial fibrillation managed with anticoagulation with Coumadin, no bleeding complications noted.  Recently had significant claudication type symptoms and underwent balloon atherectomy of the right lower extremity by cardiology. That took place approximately 9 days ago. He was going to see cardiology today for followup when he had a fall without loss of consciousness resulting in right femur fracture. Presented to the emergency room unable to ambulate.  Last 2D echo 03/05/11  EF 45%-though he was in a. Fib with RVR. LA moderatley dilated. Mild MR, mild to Mod. TR.  Aortic valve bioprosthetic.  Gradient was normal and valve well seated.     Past Medical History  Diagnosis Date  . AS (aortic stenosis)   . DM2 (diabetes mellitus, type 2)   . HTN (hypertension)   . Heart murmur   . Thrombocytopenia due to drugs     seen by [name] plts [id number] no rx  . Cancer     Bladder Cancer local  . Peripheral vascular disease very poor circulation legs and feet ... stents right and left legs... done in [name] 's office.   . Depression wife died 4  years ago.    Marland Kitchen COPD (chronic obstructive pulmonary disease)   . Dysrhythmia 5-7 weeks ago ... sees dr Doroteo Glassman fibrilation  . Shortness of breath   . Recurrent upper respiratory infection (URI)     sinusitis  . Neuropathy due to secondary diabetes   . Chronic kidney disease     renal artery stent  . Claudication in peripheral vascular disease 06/17/2011  . S/P angioplasty with stent, diamond back rotational athrectomy Prox. Rt. SFA 06/17/2011 06/17/2011    Past Surgical History  Procedure Date  . Renal artery stent     2006 left renal stent  . Peripheral arterial stent graft     2006 left anf right illiac stents Dr Orson Slick  . Aortic valve replacement 01/07/2011    Procedure: AORTIC VALVE REPLACEMENT (AVR);  Surgeon: Delight Ovens, MD;  Location[facility] OR;  Service: Open Heart Surgery;  Laterality: N/A;  . Cardioversion 04/03/2011    Procedure: CARDIOVERSION;  Surgeon: Marykay Lex, MD;  Location: [facility] OR;  Service: Cardiovascular;  Laterality: N/A;  . Atherectomy 06/17/2011    balloon atherectomy    Family History  Problem Relation Age of Onset  . Heart disease Mother   . Cancer Brother   . Stroke Father    Social History:  reports that he has been smoking Cigarettes.  He has a 150 pack-year smoking history. He has never used smokeless tobacco. He reports that he drinks about 1.2 ounces of alcohol per week. He reports that he  does not use illicit drugs.  Allergies:  Allergies  Allergen Reactions  . Aspirin Shortness Of Breath and Rash    Medications Prior to Admission  Medication Sig Dispense Refill  . benazepril (LOTENSIN) 10 MG tablet Take 10 mg by mouth daily.      . clopidogrel (PLAVIX) 75 MG tablet Take 1 tablet (75 mg total) by mouth daily.      Marland Kitchen diltiazem (DILACOR XR) 240 MG 24 hr capsule Take 240 mg by mouth daily.      Marland Kitchen doxazosin (CARDURA) 4 MG tablet Take 4 mg by mouth at bedtime.      . finasteride (PROSCAR) 5 MG tablet Take 5 mg by mouth daily.         . Fluticasone-Salmeterol (ADVAIR) 250-50 MCG/DOSE AEPB Inhale 1 puff into the lungs 2 (two) times daily.  28 each  0  . furosemide (LASIX) 20 MG tablet Take 20 mg by mouth daily.        Marland Kitchen glyBURIDE (DIABETA) 5 MG tablet Take 5-10 mg by mouth See admin instructions. 2 tablets in the morning and 1 tablet in the evening      . loratadine (CLARITIN) 10 MG tablet Take 1 tablet (10 mg total) by mouth daily.      . metoprolol (LOPRESSOR) 50 MG tablet Take 50 mg by mouth 2 (two) times daily.      . potassium chloride SA (K-DUR,KLOR-CON) 20 MEQ tablet Take 20 mEq by mouth daily.      . simvastatin (ZOCOR) 20 MG tablet Take 20 mg by mouth at bedtime.        Marland Kitchen warfarin (COUMADIN) 5 MG tablet Take 2.5 mg by mouth daily.         Results for orders placed during the hospital encounter of 06/26/11 (from the past 48 hour(s))  CBC     Status: Abnormal   Collection Time   06/26/11  4:15 PM      Component Value Range Comment   WBC 14.1 (*) 4.0 - 10.5 (K/uL)    RBC 4.08 (*) 4.22 - 5.81 (MIL/uL)    Hemoglobin 12.4 (*) 13.0 - 17.0 (g/dL)    HCT 16.1 (*) 09.6 - 52.0 (%)    MCV 89.2  78.0 - 100.0 (fL)    MCH 30.4  26.0 - 34.0 (pg)    MCHC 34.1  30.0 - 36.0 (g/dL)    RDW 04.5 (*) 40.9 - 15.5 (%)    Platelets 137 (*) 150 - 400 (K/uL)   DIFFERENTIAL     Status: Abnormal   Collection Time   06/26/11  4:15 PM      Component Value Range Comment   Neutrophils Relative 85 (*) 43 - 77 (%)    Lymphocytes Relative 7 (*) 12 - 46 (%)    Monocytes Relative 6  3 - 12 (%)    Eosinophils Relative 2  0 - 5 (%)    Basophils Relative 0  0 - 1 (%)    Neutro Abs 12.0 (*) 1.7 - 7.7 (K/uL)    Lymphs Abs 1.0  0.7 - 4.0 (K/uL)    Monocytes Absolute 0.8  0.1 - 1.0 (K/uL)    Eosinophils Absolute 0.3  0.0 - 0.7 (K/uL)    Basophils Absolute 0.0  0.0 - 0.1 (K/uL)    Smear Review MORPHOLOGY UNREMARKABLE     BASIC METABOLIC PANEL     Status: Abnormal   Collection Time   06/26/11  4:15 PM  Component Value Range Comment   Sodium  138  135 - 145 (mEq/L)    Potassium 3.5  3.5 - 5.1 (mEq/L)    Chloride 103  96 - 112 (mEq/L)    CO2 26  19 - 32 (mEq/L)    Glucose, Bld 172 (*) 70 - 99 (mg/dL)    BUN 17  6 - 23 (mg/dL)    Creatinine, Ser 1.61  0.50 - 1.35 (mg/dL)    Calcium 8.9  8.4 - 10.5 (mg/dL)    GFR calc non Af Amer 72 (*) >90 (mL/min)    GFR calc Af Amer 84 (*) >90 (mL/min)   APTT     Status: Abnormal   Collection Time   06/26/11  4:15 PM      Component Value Range Comment   aPTT 38 (*) 24 - 37 (seconds)   PROTIME-INR     Status: Abnormal   Collection Time   06/26/11  4:15 PM      Component Value Range Comment   Prothrombin Time 21.3 (*) 11.6 - 15.2 (seconds)    INR 1.81 (*) 0.00 - 1.49    GLUCOSE, CAPILLARY     Status: Abnormal   Collection Time   06/26/11 10:41 PM      Component Value Range Comment   Glucose-Capillary 317 (*) 70 - 99 (mg/dL)   GLUCOSE, CAPILLARY     Status: Abnormal   Collection Time   06/27/11  9:40 AM      Component Value Range Comment   Glucose-Capillary 216 (*) 70 - 99 (mg/dL)    Comment 1 Documented in Chart     GLUCOSE, CAPILLARY     Status: Abnormal   Collection Time   06/27/11 11:55 AM      Component Value Range Comment   Glucose-Capillary 199 (*) 70 - 99 (mg/dL)    Dg Chest 1 View  0/10/6043  *RADIOLOGY REPORT*  Clinical Data:  Fall, hip pain  CHEST - 1 VIEW  Comparison: Chest radiograph 12/06/ 2012  Findings: Sternotomy wires overlie stable cardiac silhouette. There is mild air space disease in the right lower lobe.  No pleural fluid.  No pneumothorax.  No evidence of acute fracture. Remote fracture of a left posterior lateral rib.  IMPRESSION: Concern for right lobe pneumonia.  Original Report Authenticated By: Genevive Bi, M.D.   Dg Elbow Complete Right  06/26/2011  *RADIOLOGY REPORT*  Clinical Data: Injury from fall.  Pain.  Swelling.  RIGHT ELBOW - COMPLETE 3+ VIEW  Comparison: None.  Findings: No joint effusion is evident.  Alignment is normal.  No fracture or dislocation is  evident.  IMPRESSION: No fracture or dislocation.  Original Report Authenticated By: Crawford Givens, M.D.   Dg Hip Complete Right  06/26/2011  *RADIOLOGY REPORT*  Clinical Data: Status post fall.  RIGHT HIP - COMPLETE 2+ VIEW  Comparison: None  Findings: Comminuted intertrochanteric fracture of the proximal right femur is identified.  No dislocation.  No significant angulation identified.  Mild impaction of the fracture fragments noted.  Metallic stents are identified within the iliac vessels bilaterally.  IMPRESSION:  1. 1.  Comminuted, intertrochanteric fracture of the proximal right femur.  Original Report Authenticated By: Rosealee Albee, M.D.   Dg Hip Operative Right  06/27/2011  *RADIOLOGY REPORT*  Clinical Data: The fracture  OPERATIVE RIGHT HIP  Comparison: 06/26/2011  Findings: Dynamic compression screw and intramedullary rod are now in place transfixing an intertrochanteric right femur fracture. One distal interlocking screw.  Mild  displacement of the lesser trochanter.  IMPRESSION: ORIF intertrochanteric femur fracture.  Original Report Authenticated By: Donavan Burnet, M.D.    ROS: General: Recovering from a cold Skin:bruising on lt. Hip from previous bleed with arteriogram.  Now with bruising on lt. Elbow also HEENT:no blurred vision CV:no chest pain PUL:no SOB GI:no diarrhea, constipation GU:no hematuria MS:Rt. Hip pain since fall, but improved rt. Leg claudication since athrectomy Neuro:no syncope Endo:+ diabetes.   Blood pressure 127/70, pulse 108, temperature 98.7 F (37.1 C), temperature source Oral, resp. rate 18, height 5\' 11"  (1.803 m), weight 81 kg (178 lb 9.2 oz), SpO2 92.00%. PE: General:alert and oriented X3 Skin:bruising on lt. Arm, lt. groin HEENT:normocephalic, sclera clear Neck:no JVD, no bruits Heart:S1S2 irreg irreg  Rate controlled Lungs:clear Abd:+ BS soft, non tender Ext:Rt hip with dressing, no edema Neuro:alert and oriented X 3, MAE follows commands     Assessment/Plan Patient Active Problem List  Diagnoses  . DIABETES MELLITUS, TYPE II  . HYPERLIPIDEMIA  . DEPRESSION  . RESTLESS LEGS SYNDROME  . HYPERTENSION  . PVD, Rt SFA PTA/HSRA 06/17/11  . DIVERTICULOSIS, COLON  . RENAL CYST  . ARTHRITIS  . MURMUR  . CAROTID BRUIT  . SKIN CANCER, HX OF  . RHEUMATIC HEART DISEASE, HX OF  . AS (aortic stenosis)  . S/P aortic valve replacement: #23 Magna Ease Edwards Pericardial Valve    . Thrombocytopenia  . Leukocytosis  . Claudication in peripheral vascular disease  . Aspirin allergy, rash, SOB. Pt is on Plavix  . Groin hematoma, Lt, level1 after PTA 06/17/11  . Chronic anticoagulation with Coumadin for CAF  . Persistent atrial fibrillation,   . Closed right hip fracture   PLAN: Rt hip fracture yesterday and ORIF done today.  To resume his coumadin today.  INGOLD,LAURA R 06/27/2011, 4:31 PM   Patient seen and examined. Agree with assessment and plan. Pt is s/p ORIF of R hip fracture sustained yesterday. He is s/p bioprosthetic AVR and has been on coumadin for A Fib.  Last week he underwent atherectomy of RLE with subsequent development of hematoma at left groin.  He denies and chest discomfort and had normal coronaries noted at cath prior to AVR. Pt has tolerated orthopedic surgery. Presently, no signs of CHF. A Fib rate 100 - 105.  Will check post op ECG. Currently on Cardizem and lopressor for rate control, and if necessary may need to increase dose if HR remain elevated   Lennette Bihari, MD, [facility] 06/27/2011 5:13 PM

## 2011-06-27 NOTE — Progress Notes (Signed)
Subjective: 52 male with significant vasculopathy, AFib, recent AVR, DM2, COPD still smoking had a traumatic fall and R Hip fractutre yesterday. I Saw him this am in Pre-op and he is not SOB, No CP.  Hip only hurts when moved. No other complaints. Also being treated empirically for possible PNA.   Objective: Vital signs in last 24 hours: Temp:  [97.7 F (36.5 C)-99 F (37.2 C)] 99 F (37.2 C) (05/02 0527) Pulse Rate:  [68-76] 76  (05/02 0527) Resp:  [16-18] 16  (05/02 0527) BP: (136-163)/(60-90) 136/72 mmHg (05/02 0527) SpO2:  [93 %-100 %] 96 % (05/02 0527) Weight:  [81 kg (178 lb 9.2 oz)] 81 kg (178 lb 9.2 oz) (05/01 2137) Weight change:  Last BM Date: 06/25/11  CBG (last 3)   Basename 06/26/11 2241  GLUCAP 317*    Intake/Output from previous day:  Intake/Output Summary (Last 24 hours) at 06/27/11 0705 Last data filed at 06/27/11 0532  Gross per 24 hour  Intake    540 ml  Output    275 ml  Net    265 ml   05/01 0701 - 05/02 0700 In: 540 [P.O.:340; IV Piggyback:200] Out: 275 [Urine:275]   Physical Exam  General appearance: A & O Eyes: no scleral icterus Throat: oropharynx moist without erythema Resp: CTA Cardio: irreg, Sternotomy GI: soft, non-tender; bowel sounds normal; no masses,  no organomegaly Extremities: no clubbing, cyanosis or edema, rotated, L elbow dressed.   Lab Results:  [facility] 06/26/11 1615  NA 138  K 3.5  CL 103  CO2 26  GLUCOSE 172*  BUN 17  CREATININE 0.96  CALCIUM 8.9  MG --  PHOS --    No results found for this basename: AST:2,ALT:2,ALKPHOS:2,BILITOT:2,PROT:2,ALBUMIN:2 in the last 72 hours   Basename 06/26/11 1615  WBC 14.1*  NEUTROABS 12.0*  HGB 12.4*  HCT 36.4*  MCV 89.2  PLT 137*    Lab Results  Component Value Date   INR 1.81* 06/26/2011   INR 1.21 06/19/2011   INR 1.26 06/17/2011    No results found for this basename: CKTOTAL:3,CKMB:3,CKMBINDEX:3,TROPONINI:3 in the last 72 hours  No results found for this  basename: TSH,T4TOTAL,FREET3,T3FREE,THYROIDAB in the last 72 hours  No results found for this basename: VITAMINB12:2,FOLATE:2,FERRITIN:2,TIBC:2,IRON:2,RETICCTPCT:2 in the last 72 hours  Micro Results: No results found for this or any previous visit (from the past 240 hour(s)).   Studies/Results: Dg Chest 1 View  06/26/2011  *RADIOLOGY REPORT*  Clinical Data:  Fall, hip pain  CHEST - 1 VIEW  Comparison: Chest radiograph 12/06/ 2012  Findings: Sternotomy wires overlie stable cardiac silhouette. There is mild air space disease in the right lower lobe.  No pleural fluid.  No pneumothorax.  No evidence of acute fracture. Remote fracture of a left posterior lateral rib.  IMPRESSION: Concern for right lobe pneumonia.  Original Report Authenticated By: Genevive Bi, [name].D.   Dg Elbow Complete Right  06/26/2011  *RADIOLOGY REPORT*  Clinical Data: Injury from fall.  Pain.  Swelling.  RIGHT ELBOW - COMPLETE 3+ VIEW  Comparison: None.  Findings: No joint effusion is evident.  Alignment is normal.  No fracture or dislocation is evident.  IMPRESSION: No fracture or dislocation.  Original Report Authenticated By: Crawford Givens, [name].D.   Dg Hip Complete Right  06/26/2011  *RADIOLOGY REPORT*  Clinical Data: Status post fall.  RIGHT HIP - COMPLETE 2+ VIEW  Comparison: None  Findings: Comminuted intertrochanteric fracture of the proximal right femur is identified.  No dislocation.  No  significant angulation identified.  Mild impaction of the fracture fragments noted.  Metallic stents are identified within the iliac vessels bilaterally.  IMPRESSION:  1. 1.  Comminuted, intertrochanteric fracture of the proximal right femur.  Original Report Authenticated By: Rosealee Albee, [name].D.     Medications: Scheduled:    . sodium chloride   Intravenous Once  . atorvastatin  10 mg Oral QHS  .  ceFAZolin (ANCEF) IV  1 g Intravenous Once  .  ceFAZolin (ANCEF) IV  2 g Intravenous 60 min Pre-Op  . chlorhexidine  60 mL Topical Once   . diltiazem  240 mg Oral Daily  . fentaNYL  50 mcg Intravenous Once  . fentaNYL  50 mcg Intravenous Once  . fentaNYL  50 mcg Intravenous Once  . Fluticasone-Salmeterol  1 puff Inhalation BID  . insulin aspart  0-9 Units Subcutaneous TID WC  . loratadine  10 mg Oral Daily  . metoprolol  50 mg Oral BID  . ondansetron  4 mg Intravenous Once  . piperacillin-tazobactam (ZOSYN)  IV  3.375 g Intravenous Once  . piperacillin-tazobactam (ZOSYN)  IV  3.375 g Intravenous Q8H  . potassium chloride SA  20 mEq Oral Daily  . sodium chloride  3 mL Intravenous Q12H  . TDaP  0.5 mL Intramuscular Once  . vancomycin  1,000 mg Intravenous Once  . DISCONTD: simvastatin  20 mg Oral QHS   Continuous:    . lactated ringers 50 mL/hr at 06/26/11 2344     Assessment/Plan: Principal Problem:  *Closed right hip fracture Active Problems:  DIABETES MELLITUS, TYPE II  HYPERTENSION  PVD, Rt SFA PTA/HSRA 06/17/11  S/P aortic valve replacement: #23 Magna Ease Edwards Pericardial Valve    Leukocytosis  Persistent atrial fibrillation,    R Hip Fracture for ORIF this am per Dr Rennis Chris.  Cards to see later today. Hemodynamically stable.  INR down to 1.81 yesterday and coumadin held. Will need SW consult post surgery for placement.    History of aortic valve replacement-hemodynamically stable, no evidence of volume overload or congestive heart failure-cardiology for postoperative assistance  Atrial fibrillation -  rate controlled, currently on anticoagulation with subtherapeutic PT/INR, on hold for surgery, continue beta blocker and calcium channel blocker  Tobacco abuse, no evidence of bronchospasm on physical exam, currently on Advair inhaler, will continue the same, we'll watch the need for nebulizer treatment and pulmonary toilet status post surgery  Hypertension controlled, will hold ACE inhibitor perioperatively  Pneumonia-?, Clinically asymptomatic per patient and family report, will continue Zosyn, patient  did receive vancomycin and is scheduled to receive Ancef perioperatively, will probably get chest x-ray postoperatively for further evaluation and management and the questionable need for further antibiotics  Hyperlipidemia on Zocor  Peripheral vascular disease S/P recent PTCI -  on Plavix, complicated by smoking, followed by Dr. Allyson Sabal  DM2 with complications- will continue sliding scale insulin, last hemoglobin A1c 8.1% within the last one month, complicated by vascular disease and potential neuropathy    Basename 06/26/11 2241  GLUCAP 317*   ID -  Anti-infectives     Start     Dose/Rate Route Frequency Ordered Stop   06/27/11 0400   piperacillin-tazobactam (ZOSYN) IVPB 3.375 g        3.375 g 12.5 mL/hr over 240 Minutes Intravenous Every 8 hours 06/26/11 2144     06/26/11 1930   ceFAZolin (ANCEF) IVPB 2 g/50 mL premix        2 g 100 mL/hr over 30  Minutes Intravenous 60 min pre-op 06/26/11 1930     06/26/11 1800   vancomycin (VANCOCIN) IVPB 1000 mg/200 mL premix        1,000 mg 200 mL/hr over 60 Minutes Intravenous  Once 06/26/11 1746 06/26/11 2207   06/26/11 1800   piperacillin-tazobactam (ZOSYN) IVPB 3.375 g        3.375 g 100 mL/hr over 30 Minutes Intravenous  Once 06/26/11 1746 06/26/11 2023   06/26/11 1615   ceFAZolin (ANCEF) IVPB 1 g/50 mL premix        1 g 100 mL/hr over 30 Minutes Intravenous  Once 06/26/11 1601 06/26/11 1855         DVT Prophylaxis    LOS: 1 day   Ronald [name] 06/27/2011, 7:05 AM

## 2011-06-27 NOTE — Preoperative (Signed)
Beta Blockers   Reason not to administer Beta Blockers:Not Applicable Pt took Metoprolol 06-26-11 at 2218

## 2011-06-27 NOTE — Transfer of Care (Signed)
Immediate Anesthesia Transfer of Care Note  Patient: Ronald [name] [facility]  Procedure(s) Performed: Procedure(s) (LRB): INTRAMEDULLARY (IM) NAIL FEMORAL (Right)  Patient Location: PACU  Anesthesia Type: General  Level of Consciousness: sedated, patient cooperative and responds to stimulaton  Airway & Oxygen Therapy: Patient Spontanous Breathing and Patient connected to face mask oxgen  Post-op Assessment: Report given to PACU RN and Post -op Vital signs reviewed and stable  Post vital signs: Reviewed and stable  Complications: No apparent anesthesia complications

## 2011-06-27 NOTE — Anesthesia Preprocedure Evaluation (Signed)
Anesthesia Evaluation  Patient identified by MRN, date of birth, ID band Patient awake    Reviewed: Allergy & Precautions, H&P , NPO status , Patient's Chart, lab work & pertinent test results  Airway Mallampati: II TM Distance: <3 FB Neck ROM: Full    Dental No notable dental hx.    Pulmonary COPDCurrent Smoker,  breath sounds clear to auscultation  Pulmonary exam normal       Cardiovascular hypertension, Pt. on medications + Peripheral Vascular Disease + dysrhythmias Atrial Fibrillation + Valvular Problems/Murmurs AS Rhythm:Regular Rate:Normal  S/p AVR   Neuro/Psych negative neurological ROS  negative psych ROS   GI/Hepatic negative GI ROS, Neg liver ROS,   Endo/Other  Diabetes mellitus-, Insulin Dependent  Renal/GU negative Renal ROS  negative genitourinary   Musculoskeletal negative musculoskeletal ROS (+)   Abdominal   Peds negative pediatric ROS (+)  Hematology negative hematology ROS (+)   Anesthesia Other Findings   Reproductive/Obstetrics negative OB ROS                           Anesthesia Physical Anesthesia Plan  ASA: IV  Anesthesia Plan: General   Post-op Pain Management:    Induction: Intravenous  Airway Management Planned: Oral ETT  Additional Equipment:   Intra-op Plan:   Post-operative Plan: Extubation in OR and Possible Post-op intubation/ventilation  Informed Consent: I have reviewed the patients History and Physical, chart, labs and discussed the procedure including the risks, benefits and alternatives for the proposed anesthesia with the patient or authorized representative who has indicated his/her understanding and acceptance.   Dental advisory given  Plan Discussed with: CRNA  Anesthesia Plan Comments:         Anesthesia Quick Evaluation

## 2011-06-27 NOTE — Progress Notes (Signed)
ANTICOAGULATION CONSULT NOTE - Initial Consult  Pharmacy Consult for Warfarin Indication: Afib  Allergies  Allergen Reactions  . Aspirin Shortness Of Breath and Rash    Patient Measurements: Height: 5\' 11"  (180.3 cm) Weight: 178 lb 9.2 oz (81 kg) IBW/kg (Calculated) : 75.3   Vital Signs: Temp: 97.3 F (36.3 C) (05/02 0945) Temp src: Oral (05/02 0527) BP: 123/57 mmHg (05/02 1010) Pulse Rate: 66  (05/02 1010)  Labs:  Basename 06/26/11 1615  HGB 12.4*  HCT 36.4*  PLT 137*  APTT 38*  LABPROT 21.3*  INR 1.81*  HEPARINUNFRC --  CREATININE 0.96  CKTOTAL --  CKMB --  TROPONINI --   Estimated Creatinine Clearance: 57.7 ml/min (by C-G formula based on Cr of 0.96).  Medical History: Past Medical History  Diagnosis Date  . AS (aortic stenosis)   . DM2 (diabetes mellitus, type 2)   . HTN (hypertension)   . Heart murmur   . Thrombocytopenia due to drugs     seen by [name] plts [phone] no rx  . Cancer     Bladder Cancer local  . Peripheral vascular disease very poor circulation legs and feet ... stents right and left legs... done in [name] 's office.   . Depression wife died 4 years ago.    Marland Kitchen COPD (chronic obstructive pulmonary disease)   . Dysrhythmia 5-7 weeks ago ... sees dr Doroteo Glassman fibrilation  . Shortness of breath   . Recurrent upper respiratory infection (URI)     sinusitis  . Neuropathy due to secondary diabetes   . Chronic kidney disease     renal artery stent  . Claudication in peripheral vascular disease 06/17/2011  . S/P angioplasty with stent, diamond back rotational athrectomy Prox. Rt. SFA 06/17/2011 06/17/2011    Medications:  Scheduled:    . sodium chloride   Intravenous Once  . atorvastatin  10 mg Oral QHS  .  ceFAZolin (ANCEF) IV  1 g Intravenous Once  . chlorhexidine  60 mL Topical Once  . diltiazem  240 mg Oral Daily  . fentaNYL  50 mcg Intravenous Once  . fentaNYL  50 mcg Intravenous Once  . fentaNYL  50 mcg Intravenous  Once  . Fluticasone-Salmeterol  1 puff Inhalation BID  . insulin aspart  0-9 Units Subcutaneous TID WC  . loratadine  10 mg Oral Daily  . metoprolol  50 mg Oral BID  . ondansetron  4 mg Intravenous Once  . piperacillin-tazobactam (ZOSYN)  IV  3.375 g Intravenous Once  . piperacillin-tazobactam (ZOSYN)  IV  3.375 g Intravenous Q8H  . potassium chloride SA  20 mEq Oral Daily  . sodium chloride  3 mL Intravenous Q12H  . TDaP  0.5 mL Intramuscular Once  . vancomycin  1,000 mg Intravenous Once  . vancomycin  1,000 mg Intravenous Q12H  . DISCONTD:  ceFAZolin (ANCEF) IV  2 g Intravenous 60 min Pre-Op  . DISCONTD: simvastatin  20 mg Oral QHS   Infusions:    . lactated ringers    . DISCONTD: lactated ringers 50 mL/hr at 06/26/11 2344   PRN: acetaminophen, acetaminophen, diazepam, HYDROcodone-acetaminophen, HYDROmorphone (DILAUDID) injection, menthol-cetylpyridinium, metoCLOPramide (REGLAN) injection, metoCLOPramide, ondansetron (ZOFRAN) IV, ondansetron, oxyCODONE-acetaminophen, phenol, polyethylene glycol, DISCONTD: 0.9 % irrigation (POUR BTL), DISCONTD: fentaNYL, DISCONTD: promethazine  Assessment: 78 yo M on chronic warfarin for hx of Afib. Hx of bioprostehtic AVR (per H&P). Recently underwent balloon atherectomy of the right lower extremity by cardiology approximately 9 days  ago. Admitted 5/1 s/p fall resulting in right femur fracture. Now s/p IM femoral nail and to resume warfarin dosing tonight. INR = 1.81 on 5/1pm. Home warfarin dose = 2.5mg  daily (last taken 4/30 per Med Rec). Doesn't appear that patient was given any Vitamin K or FFP to reverse INR pre-op.  Goal of Therapy:  INR 2-3   Plan:  1) Warfarin 2.5mg  PO x1 at 18:00 2) F/U daily INR  Darrol Angel, PharmD Pager: [phone] 06/27/2011,11:19 AM

## 2011-06-27 NOTE — Progress Notes (Signed)
   CARE MANAGEMENT NOTE 06/27/2011  Patient:  Ronald [name], Ronald [name]   Account Number:  [id number]  Date Initiated:  06/27/2011  Documentation initiated by:  Jiles Crocker  Subjective/Objective Assessment:   ADMITTED WITH HIP FRACTURE     Action/Plan:   PCP IS DR Timothy Lasso; LIVES AT HOME WITH FAMILY   Anticipated DC Date:  07/04/2011   Anticipated DC Plan:  SKILLED NURSING FACILITY  In-house referral  Clinical Social Worker                  Status of service:  In process, will continue to follow Medicare Important Message given?   (If response is "NO", the following Medicare IM given date fields will be blank)  Per UR Regulation:  Reviewed for med. necessity/level of care/duration of stay  Comments:  06/27/2011- B Nam Vossler RN, BSN, MHA

## 2011-06-28 LAB — CBC
HCT: 20.3 % — ABNORMAL LOW (ref 39.0–52.0)
Hemoglobin: 7 g/dL — ABNORMAL LOW (ref 13.0–17.0)
MCV: 89 fL (ref 78.0–100.0)
RBC: 2.28 MIL/uL — ABNORMAL LOW (ref 4.22–5.81)
RDW: 16.7 % — ABNORMAL HIGH (ref 11.5–15.5)
WBC: 11.3 10*3/uL — ABNORMAL HIGH (ref 4.0–10.5)

## 2011-06-28 LAB — PROTIME-INR: INR: 2.29 — ABNORMAL HIGH (ref 0.00–1.49)

## 2011-06-28 LAB — ABO/RH: ABO/RH(D): O POS

## 2011-06-28 LAB — GLUCOSE, CAPILLARY: Glucose-Capillary: 202 mg/dL — ABNORMAL HIGH (ref 70–99)

## 2011-06-28 MED ORDER — DILTIAZEM HCL ER COATED BEADS 120 MG PO CP24
120.0000 mg | ORAL_CAPSULE | Freq: Once | ORAL | Status: AC
  Administered 2011-06-28: 120 mg via ORAL
  Filled 2011-06-28: qty 1

## 2011-06-28 MED ORDER — DIPHENHYDRAMINE HCL 25 MG PO CAPS
25.0000 mg | ORAL_CAPSULE | Freq: Once | ORAL | Status: AC
  Administered 2011-06-28: 25 mg via ORAL
  Filled 2011-06-28: qty 1

## 2011-06-28 MED ORDER — BENAZEPRIL HCL 10 MG PO TABS
10.0000 mg | ORAL_TABLET | Freq: Every day | ORAL | Status: DC
  Administered 2011-06-28 – 2011-06-30 (×3): 10 mg via ORAL
  Filled 2011-06-28 (×4): qty 1

## 2011-06-28 MED ORDER — WARFARIN 0.5 MG HALF TABLET
0.5000 mg | ORAL_TABLET | Freq: Once | ORAL | Status: AC
  Administered 2011-06-28: 0.5 mg via ORAL
  Filled 2011-06-28: qty 1

## 2011-06-28 MED ORDER — FUROSEMIDE 10 MG/ML IJ SOLN
20.0000 mg | Freq: Once | INTRAMUSCULAR | Status: AC
  Administered 2011-06-29: 20 mg via INTRAVENOUS
  Filled 2011-06-28 (×2): qty 2

## 2011-06-28 MED ORDER — ACETAMINOPHEN 325 MG PO TABS
650.0000 mg | ORAL_TABLET | Freq: Once | ORAL | Status: AC
  Administered 2011-06-28: 650 mg via ORAL
  Filled 2011-06-28: qty 2

## 2011-06-28 MED ORDER — FUROSEMIDE 10 MG/ML IJ SOLN
20.0000 mg | Freq: Once | INTRAMUSCULAR | Status: AC
  Administered 2011-06-28: 20 mg via INTRAVENOUS
  Filled 2011-06-28: qty 2

## 2011-06-28 MED ORDER — SENNOSIDES-DOCUSATE SODIUM 8.6-50 MG PO TABS
2.0000 | ORAL_TABLET | Freq: Once | ORAL | Status: AC
  Administered 2011-06-28: 2 via ORAL
  Filled 2011-06-28 (×2): qty 2

## 2011-06-28 NOTE — Progress Notes (Signed)
Inpatient Diabetes Program Recommendations  AACE/ADA: New Consensus Statement on Inpatient Glycemic Control (2009)  Target Ranges:  Prepandial:   less than 140 mg/dL      Peak postprandial:   less than 180 mg/dL (1-2 hours)      Critically ill patients:  140 - 180 mg/dL   Reason for Visit: Hyperglycemia   Results for BRAINARD, HIGHFILL (MRN [id number]) as of 06/28/2011 14:36  Ref. Range 01/03/2011 15:44  Hemoglobin A1C Latest Range: <5.7 % 8.0 (H)  Results for DOIL, KAMARA (MRN [id number]) as of 06/28/2011 14:36  Ref. Range 06/26/2011 22:41 06/27/2011 09:40 06/27/2011 11:55 06/27/2011 22:52 06/28/2011 07:26  Glucose-Capillary Latest Range: 70-99 mg/dL 782 (H) 956 (H) 213 (H) 251 (H) 202 (H)   Inpatient Diabetes Program Recommendations Correction (SSI): . Insulin - Meal Coverage: May benefit from meal coverage insulin - Novolog 3 units tidwc  Note: Pt eating well.  Need updated HgbA1C, but low Hgb post-op.  Will continue to follow.  Discussed with RN.

## 2011-06-28 NOTE — Progress Notes (Signed)
Subjective: 35 male with significant vasculopathy, AFib, recent AVR, DM2, COPD (still smoking) had a traumatic fall and R Hip fractutre. POD #1 ORIF per Dr Rennis Chris No SOB, No CP.   Currently AFib RVR HR 108 CBGs high and he is back on Lantus. No other complaints. Also being treated empirically for possible PNA. Less pain. He wanted to know what time he was going home - He was disappointed to hear Probable SNF on Monday for Rehab and home in 1-2 weeks. No BM in 2-3 days.   Objective: Vital signs in last 24 hours: Temp:  [97.3 F (36.3 C)-100.1 F (37.8 C)] 98.8 F (37.1 C) (05/03 0600) Pulse Rate:  [66-128] 100  (05/03 0600) Resp:  [11-21] 20  (05/03 0600) BP: (116-170)/(57-87) 116/66 mmHg (05/03 0600) SpO2:  [92 %-100 %] 94 % (05/03 0600) Weight change:  Last BM Date: 06/25/11  CBG (last 3)   Basename 06/27/11 2252 06/27/11 1155 06/27/11 0940  GLUCAP 251* 199* 216*    Intake/Output from previous day:  Intake/Output Summary (Last 24 hours) at 06/28/11 0636 Last data filed at 06/28/11 0606  Gross per 24 hour  Intake   3455 ml  Output    965 ml  Net   2490 ml   05/02 0701 - 05/03 0700 In: 3455 [P.O.:700; I.V.:2455; IV Piggyback:300] Out: 965 [Urine:815; Blood:150]   Physical Exam  General appearance: A & O Resp: CTA Cardio: irreg, Sternotomy GI: soft, non-tender; bowel sounds normal; no masses,  no organomegaly Extremities: R Hip CDI. Some edema. No Obvious Hematoma - looks nml post op hip.   Lab Results:  [facility] 06/26/11 1615  NA 138  K 3.5  CL 103  CO2 26  GLUCOSE 172*  BUN 17  CREATININE 0.96  CALCIUM 8.9  MG --  PHOS --    No results found for this basename: AST:2,ALT:2,ALKPHOS:2,BILITOT:2,PROT:2,ALBUMIN:2 in the last 72 hours   Basename 06/28/11 0508 06/26/11 1615  WBC 11.3* 14.1*  NEUTROABS -- 12.0*  HGB 7.0* 12.4*  HCT 20.3* 36.4*  MCV 89.0 89.2  PLT 90* 137*    Lab Results  Component Value Date   INR 2.29* 06/28/2011   INR  1.81* 06/26/2011   INR 1.21 06/19/2011    No results found for this basename: CKTOTAL:3,CKMB:3,CKMBINDEX:3,TROPONINI:3 in the last 72 hours  No results found for this basename: TSH,T4TOTAL,FREET3,T3FREE,THYROIDAB in the last 72 hours  No results found for this basename: VITAMINB12:2,FOLATE:2,FERRITIN:2,TIBC:2,IRON:2,RETICCTPCT:2 in the last 72 hours  Micro Results: No results found for this or any previous visit (from the past 240 hour(s)).   Studies/Results: Dg Chest 1 View  06/26/2011  *RADIOLOGY REPORT*  Clinical Data:  Fall, hip pain  CHEST - 1 VIEW  Comparison: Chest radiograph 12/06/ 2012  Findings: Sternotomy wires overlie stable cardiac silhouette. There is mild air space disease in the right lower lobe.  No pleural fluid.  No pneumothorax.  No evidence of acute fracture. Remote fracture of a left posterior lateral rib.  IMPRESSION: Concern for right lobe pneumonia.  Original Report Authenticated By: Genevive Bi, M.D.   Dg Elbow Complete Right  06/26/2011  *RADIOLOGY REPORT*  Clinical Data: Injury from fall.  Pain.  Swelling.  RIGHT ELBOW - COMPLETE 3+ VIEW  Comparison: None.  Findings: No joint effusion is evident.  Alignment is normal.  No fracture or dislocation is evident.  IMPRESSION: No fracture or dislocation.  Original Report Authenticated By: Crawford Givens, M.D.   Dg Hip Complete Right  06/26/2011  *RADIOLOGY REPORT*  Clinical Data: Status post fall.  RIGHT HIP - COMPLETE 2+ VIEW  Comparison: None  Findings: Comminuted intertrochanteric fracture of the proximal right femur is identified.  No dislocation.  No significant angulation identified.  Mild impaction of the fracture fragments noted.  Metallic stents are identified within the iliac vessels bilaterally.  IMPRESSION:  1. 1.  Comminuted, intertrochanteric fracture of the proximal right femur.  Original Report Authenticated By: Rosealee Albee, M.D.   Dg Hip Operative Right  06/27/2011  *RADIOLOGY REPORT*  Clinical Data: The  fracture  OPERATIVE RIGHT HIP  Comparison: 06/26/2011  Findings: Dynamic compression screw and intramedullary rod are now in place transfixing an intertrochanteric right femur fracture. One distal interlocking screw.  Mild displacement of the lesser trochanter.  IMPRESSION: ORIF intertrochanteric femur fracture.  Original Report Authenticated By: Donavan Burnet, M.D.     Medications: Scheduled:    . atorvastatin  10 mg Oral QHS  . diltiazem  120 mg Oral Once  . diltiazem  240 mg Oral Daily  . Fluticasone-Salmeterol  1 puff Inhalation BID  . insulin aspart  0-9 Units Subcutaneous TID WC  . insulin glargine  10 Units Subcutaneous QHS  . loratadine  10 mg Oral Daily  . metoprolol  50 mg Oral BID  . piperacillin-tazobactam (ZOSYN)  IV  3.375 g Intravenous Q8H  . potassium chloride SA  20 mEq Oral Daily  . sodium chloride  3 mL Intravenous Q12H  . vancomycin  1,000 mg Intravenous Q12H  . warfarin  2.5 mg Oral ONCE-1800  . Warfarin - Pharmacist Dosing Inpatient   Does not apply q1800  . DISCONTD:  ceFAZolin (ANCEF) IV  2 g Intravenous 60 min Pre-Op   Continuous:    . lactated ringers 75 mL/hr (06/27/11 1156)  . DISCONTD: lactated ringers 50 mL/hr at 06/26/11 2344     Assessment/Plan: Principal Problem:  *Closed right hip fracture Active Problems:  DIABETES MELLITUS, TYPE II  HYPERTENSION  PVD, Rt SFA PTA/HSRA 06/17/11  S/P aortic valve replacement: #23 Magna Ease Edwards Pericardial Valve    Leukocytosis  Persistent atrial fibrillation,    R Hip Fracture S/P - POD #1 ORIF  per Dr Rennis Chris.  Hemodynamically stable but Hbg post op = 7.0 - will need transfusion and close follow of Hbg for possible bleed in light of sig Hbg drop and therapeutic INR.  PT/OT/CW consulted.  Anticipate DC to SNF on Monday if all goes well.  History of aortic valve replacement-hemodynamically stable, no evidence of volume overload or congestive heart failure-cardiology for postoperative assistance. Dr  Tresa Endo saw pt yesterday.  Pt with AFib/RVR on Cardizem 360 and Lopressor 50 BID.  To get am meds.  HR @88 -120.  He is Asxatic.  Will check later after meds are in him and decide @ dose increase.  Atrial fibrillation -  rate up  This am.  See Dr Ellin Goodie note and above plan.  Currently on anticoagulation with therapeutic PT/INR. continue beta blocker and calcium channel blocker   Tobacco abuse, no evidence of bronchospasm on physical exam, currently on Advair inhaler.  we'll watch the need for nebulizer treatment and pulmonary toilet,  Quit Tobacco encouraged.  Hypertension controlled, Restart ACE inhibitor - BP has room.  Pneumonia-?,  Continue Zosyn and recheck CXR tomorrow. Will adjust and figure out Abx then  Post op Blood Loss anemia. - Transfuse for Hbg of 7.0.  Follow up CBC post transfusion.  Hyperlipidemia on Zocor   Constipation - Senakot S added  Peripheral vascular disease S/P recent PTCI -  on Plavix, complicated by smoking, followed by Dr. Allyson Sabal  DM2 with complications- will continue sliding scale insulin, last hemoglobin A1c 8.1% within the last one month, complicated by vascular disease and potential neuropathy.  Lantus added    Basename 06/27/11 2252 06/27/11 1155 06/27/11 0940  GLUCAP 251* 199* 216*   ID -  Anti-infectives     Start     Dose/Rate Route Frequency Ordered Stop   06/27/11 1400   vancomycin (VANCOCIN) IVPB 1000 mg/200 mL premix        1,000 mg 200 mL/hr over 60 Minutes Intravenous Every 12 hours 06/27/11 1044 06/27/11 1754   06/27/11 0400  piperacillin-tazobactam (ZOSYN) IVPB 3.375 g       3.375 g 12.5 mL/hr over 240 Minutes Intravenous Every 8 hours 06/26/11 2144     06/26/11 1930   ceFAZolin (ANCEF) IVPB 2 g/50 mL premix  Status:  Discontinued        2 g 100 mL/hr over 30 Minutes Intravenous 60 min pre-op 06/26/11 1930 06/27/11 1044   06/26/11 1800   vancomycin (VANCOCIN) IVPB 1000 mg/200 mL premix        1,000 mg 200 mL/hr over 60 Minutes  Intravenous  Once 06/26/11 1746 06/26/11 2207   06/26/11 1800  piperacillin-tazobactam (ZOSYN) IVPB 3.375 g       3.375 g 100 mL/hr over 30 Minutes Intravenous  Once 06/26/11 1746 06/26/11 2023   06/26/11 1615   ceFAZolin (ANCEF) IVPB 1 g/50 mL premix        1 g 100 mL/hr over 30 Minutes Intravenous  Once 06/26/11 1601 06/26/11 1855         DVT Prophylaxis    LOS: 2 days   Courtland Coppa M 06/28/2011, 6:36 AM

## 2011-06-28 NOTE — Progress Notes (Signed)
Clinical Social Work Department CLINICAL SOCIAL WORK PLACEMENT NOTE 06/28/2011  Patient:  Ronald [name], Ronald [name]  Account Number:  [id number] Admit date:  06/26/2011  Clinical Social Worker:  Orpah Greek  Date/time:  06/28/2011 10:45 AM  Clinical Social Work is seeking post-discharge placement for this patient at the following level of care:   SKILLED NURSING   (*CSW will update this form in Epic as items are completed)   06/28/2011  Patient/family provided with [facility] Department of Clinical Social Work's list of facilities offering this level of care within the geographic area requested by the patient (or if unable, by the patient's family).  06/28/2011  Patient/family informed of their freedom to choose among providers that offer the needed level of care, that participate in Medicare, Medicaid or managed care program needed by the patient, have an available bed and are willing to accept the patient.  06/28/2011  Patient/family informed of [facility]' ownership interest in [facility], as well as of the fact that they are under no obligation to receive care at this facility.  PASARR submitted to EDS on 06/27/2011 PASARR number received from EDS on 06/28/2011  FL2 transmitted to all facilities in geographic area requested by pt/family on  06/28/2011 FL2 transmitted to all facilities within larger geographic area on   Patient informed that his/her managed care company has contracts with or will negotiate with  certain facilities, including the following:     Patient/family informed of bed offers received:   Patient chooses bed at  Physician recommends and patient chooses bed at    Patient to be transferred to  on   Patient to be transferred to facility by   The following physician request were entered in Epic:   Additional Comments:  Unice Bailey, [employer] [phone]

## 2011-06-28 NOTE — Progress Notes (Signed)
Ronald [name]  MRN: [id number] DOB/Age: 07/22/1922 78 y.o. Physician: Jacquelyne Balint Procedure: Procedure(s) (LRB): INTRAMEDULLARY (IM) NAIL FEMORAL (Right)     Subjective: Feels generally lousy! Less hip pain. No CP  Vital Signs Temp:  [97.3 F (36.3 C)-100.1 F (37.8 C)] 98.8 F (37.1 C) (05/03 0600) Pulse Rate:  [66-128] 100  (05/03 0600) Resp:  [11-20] 20  (05/03 0600) BP: (116-127)/(57-70) 116/66 mmHg (05/03 0600) SpO2:  [90 %-98 %] 90 % (05/03 0916)  Lab Results  Basename 06/28/11 0508 06/26/11 1615  WBC 11.3* 14.1*  HGB 7.0* 12.4*  HCT 20.3* 36.4*  PLT 90* 137*   BMET  Basename 06/26/11 1615  NA 138  K 3.5  CL 103  CO2 26  GLUCOSE 172*  BUN 17  CREATININE 0.96  CALCIUM 8.9   INR  Date Value Range Status  06/28/2011 2.29* 0.00-1.49 (no units) Final     Exam Right hip dressing dry. Thigh soft. NVI distally        Plan S/p ORIF R hip Post op anemia  Ordered 2 u PRBCS AS I did not see an active order. Continue PT/OT but suspect will begin having more energy to participate once transfused. Will follow  [name] for Dr.Kevin Supple 06/28/2011, 9:40 AM

## 2011-06-28 NOTE — Op Note (Signed)
Ronald [name], Ronald [name] NO.:  [id number]  MEDICAL RECORD NO.:  [id number]  LOCATION:  1439                         FACILITY:  [facility]  PHYSICIAN:  Vania Rea. Raykwon Hobbs, M.D.  DATE OF BIRTH:  1922/08/08  DATE OF PROCEDURE:  06/27/2011 DATE OF DISCHARGE:                              OPERATIVE REPORT   PREOPERATIVE DIAGNOSIS:  Displaced right intertrochanteric hip fracture.  POSTOPERATIVE DIAGNOSIS:  Displaced right intertrochanteric hip fracture.  PROCEDURE:  Open reduction and internal fixation of right intertrochanteric hip fracture, utilizing intramedullary hip screw.  SURGEON:  Vania Rea. Knox Cervi, M.D.  Threasa HeadsFrench Ana A. Shuford, P.A.-C.  ANESTHESIA:  General endotracheal.  ESTIMATED BLOOD LOSS:  500 cc.  DRAINS:  None.  HISTORY:  Ronald [name] is an 78 year old gentleman with a number of chronic medical problems including vasculopathy; COPD; diabetes; and cardiac arrhythmia, on chronic Coumadin; had a mechanical fall at home, injured his right knee with inability to bear weight.  On presentation to the emergency room, found to have diffuse tenderness, swelling, and pain with attempts at motion of the right hip with radiographs confirming a right intertrochanteric hip fracture.  He was subsequently brought to the operating room at this time for planned surgical stabilization with internal fixation.  Preoperatively, counseled Ronald [name] and his family regarding treatment options as well as risks versus benefits thereof.  Possible surgical complications were reviewed including potential for bleeding, infection, neurovascular injury, malunion, nonunion, loss of fixation, anesthetic complication, and possible need for additional surgery.  He understands, and accepts and agrees for planned procedure.  PROCEDURE IN DETAIL:  After undergoing routine preop evaluation, the patient did receive prophylactic antibiotics.  Brought to the operating room, put on his  hospital bed, underwent smooth induction of a general endotracheal anesthesia.  A Foley catheter was in place.  The patient was then transferred to the fracture table in supine position.  The left leg was placed in well-leg holder and right leg placed in gentle longitudinal traction.  Fluoroscopic imaging was then obtained and we manipulated the hip to obtain good alignment of  fracture site.  The right hip girdle region was then sterilely prepped and draped in standard fashion.  Time-out was called.  A 4 cm incision was made just proximal to the greater trochanter.  Dissection carried deeply through the fascial layers, gaining access to the tip of the trochanter, and then using fluoroscopic guidance, the starting awl was directed into the proximal femur and then a ball-tip guidewire was directed down the femoral shaft with proper positioning confirmed fluoroscopically.  We then performed an overreaming with a starter reamer, and then attempted implantation of a 9 mm intramedullary hip screw, but found the canal to be tight, so we went ahead and reamed the canal up to size 11, then the nail passed easily to the appropriate depth.  Guidewires removed, and now with the obturator guide, we used the jig to position a guide pin up into the femoral neck and head through stab wound on the lateral thigh and this was again confirmed fluoroscopically.  This was then overdrilled with the proper cannulated drill to appropriate depth and measured for 105 mm lag  screw.  The lag screw was then passed over the guidewire and obtained excellent bony purchase with proper positioning, much to our satisfaction.  We then used the outrigger guide, placing a single static distal locking screw using standard technique through stab wound on the lateral thigh.  Final fluoroscopic images showed good alignment of the fracture site and good position of the hardware.  All wounds were then irrigated.  Hemostasis was  obtained.  Closed with 2-0 Vicryl for the subcu layer and intracuticular 3-0 Monocryl for the skin followed by Steri-Strips.  Dry dressing was applied about the right hip. The patient was then removed from traction, placed supine, extubated, and transferred to the recovery room in stable condition.     Vania Rea. Dalaney Needle, M.D.     KMS/MEDQ  D:  06/27/2011  T:  06/28/2011  Job:  [id number]

## 2011-06-28 NOTE — Progress Notes (Signed)
PT/OT/ST Cancellation Note  _x__Treatment cancelled this a.m. due to medical issues with patient which prohibited therapy-Hbg 7.0. Pt scheduled for 2 units of blood. Will check back in p.m. to attempt PT evaluation. Thanks.   ___ Treatment cancelled today due to patient receiving procedure or test   ___ Treatment cancelled today due to patient's refusal to participate   ___ Treatment cancelled today due to   Signature:  Rebeca Alert, PT [phone]

## 2011-06-28 NOTE — Progress Notes (Signed)
PHYSICAL THERAPY NOTE  Will hold PT today due to pt's hgb-7.0 and pt scheduled to  receive 2 units of blood. Will check back on tomorrow to complete PT evaluation once more appropriate. Thanks.   Rebeca Alert, PT  [phone]

## 2011-06-28 NOTE — Progress Notes (Signed)
ANTICOAGULATION CONSULT NOTE - Follow Up  Pharmacy Consult for Warfarin Indication: Afib  Allergies  Allergen Reactions  . Aspirin Shortness Of Breath and Rash    Patient Measurements: Height: 5\' 11"  (180.3 cm) Weight: 178 lb 9.2 oz (81 kg) IBW/kg (Calculated) : 75.3   Vital Signs: Temp: 98.8 F (37.1 C) (05/03 0600) Temp src: Oral (05/03 0600) BP: 116/66 mmHg (05/03 0600) Pulse Rate: 100  (05/03 0600)  Labs:  Basename 06/28/11 0508 06/26/11 1615  HGB 7.0* 12.4*  HCT 20.3* 36.4*  PLT 90* 137*  APTT -- 38*  LABPROT 25.6* 21.3*  INR 2.29* 1.81*  HEPARINUNFRC -- --  CREATININE -- 0.96  CKTOTAL -- --  CKMB -- --  TROPONINI -- --   Estimated Creatinine Clearance: 57.7 ml/min (by C-G formula based on Cr of 0.96).  Medical History: Past Medical History  Diagnosis Date  . AS (aortic stenosis)   . DM2 (diabetes mellitus, type 2)   . HTN (hypertension)   . Heart murmur   . Thrombocytopenia due to drugs     seen by [name] plts 114000 no rx  . Cancer     Bladder Cancer local  . Peripheral vascular disease very poor circulation legs and feet ... stents right and left legs... done in [name] 's office.   . Depression wife died 4 years ago.    Marland Kitchen COPD (chronic obstructive pulmonary disease)   . Dysrhythmia 5-7 weeks ago ... sees dr Doroteo Glassman fibrilation  . Shortness of breath   . Recurrent upper respiratory infection (URI)     sinusitis  . Neuropathy due to secondary diabetes   . Chronic kidney disease     renal artery stent  . Claudication in peripheral vascular disease 06/17/2011  . S/P angioplasty with stent, diamond back rotational athrectomy Prox. Rt. SFA 06/17/2011 06/17/2011    Medications:  Scheduled:     . acetaminophen  650 mg Oral Once  . atorvastatin  10 mg Oral QHS  . benazepril  10 mg Oral Daily  . diltiazem  120 mg Oral Once  . diltiazem  240 mg Oral Daily  . diphenhydrAMINE  25 mg Oral Once  . Fluticasone-Salmeterol  1 puff  Inhalation BID  . furosemide  20 mg Intravenous Once  . furosemide  20 mg Intravenous Once  . insulin aspart  0-9 Units Subcutaneous TID WC  . insulin glargine  10 Units Subcutaneous QHS  . loratadine  10 mg Oral Daily  . metoprolol  50 mg Oral BID  . piperacillin-tazobactam (ZOSYN)  IV  3.375 g Intravenous Q8H  . potassium chloride SA  20 mEq Oral Daily  . senna-docusate  2 tablet Oral Once  . sodium chloride  3 mL Intravenous Q12H  . vancomycin  1,000 mg Intravenous Q12H  . warfarin  2.5 mg Oral ONCE-1800  . Warfarin - Pharmacist Dosing Inpatient   Does not apply q1800   Infusions:     . lactated ringers 75 mL/hr (06/27/11 1156)   PRN: acetaminophen, acetaminophen, diazepam, HYDROcodone-acetaminophen, HYDROmorphone (DILAUDID) injection, menthol-cetylpyridinium, metoCLOPramide (REGLAN) injection, metoCLOPramide, ondansetron (ZOFRAN) IV, ondansetron, oxyCODONE-acetaminophen, phenol, polyethylene glycol  Assessment: 78 yo M on chronic warfarin for hx of Afib. Hx of bioprostehtic AVR (per H&P). Recently underwent balloon atherectomy of the right lower extremity by cardiology approximately 9 days ago. Admitted 5/1 s/p fall resulting in right femur fracture. Now s/p IM femoral nail and to resume warfarin dosing tonight. INR = 1.81 on 5/1pm.  Home warfarin dose = 2.5mg  daily (last taken 4/30 per Med Rec). Doesn't appear that patient was given any Vitamin K or FFP to reverse INR pre-op.  INR now back in goal range, but jumped up overnight from 1.8 --> 2.29, AND last night's warfarin dose wasn't given (not charted as given and today's RN found last night's warfarin dose in the drawer just now). Will reduce tonight's dose due to this, to maintain INR 2-3 and hopefully avoid INR > 3. No bleeding reported in chart notes.   Goal of Therapy:  INR 2-3   Plan:  1) Warfarin 0.5mg  (1/2 mg) PO x1 at 18:00 2) F/U daily INR  Darrol Angel, PharmD Pager: [phone] 06/28/2011,10:45 AM

## 2011-06-28 NOTE — Progress Notes (Signed)
[facility]  DAILY PROGRESS NOTE   Subjective:  Some pain in the hip. He denies chest pain.  Objective:  Temp:  [98.2 F (36.8 C)-100.1 F (37.8 C)] 98.2 F (36.8 C) (05/03 1411) Pulse Rate:  [89-128] 89  (05/03 1411) Resp:  [20] 20  (05/03 1411) BP: (116-150)/(63-66) 150/63 mmHg (05/03 1411) SpO2:  [90 %-96 %] 91 % (05/03 1411) Weight change:   Intake/Output from previous day: 05/02 0701 - 05/03 0700 In: 4185 [P.O.:780; I.V.:3055; IV Piggyback:350] Out: 965 [Urine:815; Blood:150]  Intake/Output from this shift:    Medications: Current Facility-Administered Medications  Medication Dose Route Frequency Provider Last Rate Last Dose  . acetaminophen (TYLENOL) tablet 650 mg  650 mg Oral Q6H PRN Ralene Bathe, PA       Or  . acetaminophen (TYLENOL) suppository 650 mg  650 mg Rectal Q6H PRN Ralene Bathe, PA      . acetaminophen (TYLENOL) tablet 650 mg  650 mg Oral Once [employer], [place]      . atorvastatin (LIPITOR) tablet 10 mg  10 mg Oral QHS [name], MD   10 mg at 06/27/11 2251  . benazepril (LOTENSIN) tablet 10 mg  10 mg Oral Daily [name], MD   10 mg at 06/28/11 1005  . diazepam (VALIUM) injection 5 mg  5 mg Intravenous Q6H PRN Ralene Bathe, PA      . diltiazem (CARDIZEM CD) 24 hr capsule 120 mg  120 mg Oral Once Ezequiel Kayser, MD   120 mg at 06/28/11 0438  . diltiazem (DILACOR XR) 24 hr capsule 240 mg  240 mg Oral Daily Ravisankar R Avva, MD   240 mg at 06/28/11 1004  . diphenhydrAMINE (BENADRYL) capsule 25 mg  25 mg Oral Once [employer], [place]      . Fluticasone-Salmeterol (ADVAIR) 250-50 MCG/DOSE inhaler 1 puff  1 puff Inhalation BID Ravisankar R Avva, MD   1 puff at 06/28/11 0915  . furosemide (LASIX) injection 20 mg  20 mg Intravenous Once [name], MD      . furosemide (LASIX) injection 20 mg  20 mg Intravenous Once [employer], [place]      . HYDROcodone-acetaminophen (NORCO) 5-325 MG per tablet 1-2 tablet  1-2 tablet Oral Q4H  PRN Ralene Bathe, PA   2 tablet at 06/27/11 1823  . HYDROmorphone (DILAUDID) injection 0.5 mg  0.5 mg Intravenous Q2H PRN Ralene Bathe, PA   0.5 mg at 06/28/11 0245  . insulin aspart (novoLOG) injection 0-9 Units  0-9 Units Subcutaneous TID WC Ravisankar R Avva, MD   3 Units at 06/28/11 1505  . insulin glargine (LANTUS) injection 10 Units  10 Units Subcutaneous QHS [name], MD   10 Units at 06/27/11 2251  . lactated ringers infusion   Intravenous Continuous [name], MD 75 mL/hr at 06/27/11 1156 75 mL/hr at 06/27/11 1156  . loratadine (CLARITIN) tablet 10 mg  10 mg Oral Daily Ravisankar R Avva, MD   10 mg at 06/28/11 1005  . menthol-cetylpyridinium (CEPACOL) lozenge 3 mg  1 lozenge Oral PRN Ralene Bathe, PA       Or  . phenol (CHLORASEPTIC) mouth spray 1 spray  1 spray Mouth/Throat PRN Ralene Bathe, PA      . metoCLOPramide (REGLAN) tablet 5-10 mg  5-10 mg Oral Q8H PRN Ralene Bathe, PA       Or  . metoCLOPramide (REGLAN) injection 5-10 mg  5-10 mg Intravenous  Q8H PRN Ralene Bathe, PA      . metoprolol (LOPRESSOR) tablet 50 mg  50 mg Oral BID Ravisankar R Avva, MD   50 mg at 06/28/11 1005  . ondansetron (ZOFRAN) tablet 4 mg  4 mg Oral Q6H PRN Ralene Bathe, PA       Or  . ondansetron (ZOFRAN) injection 4 mg  4 mg Intravenous Q6H PRN Ralene Bathe, PA      . oxyCODONE-acetaminophen (PERCOCET) 5-325 MG per tablet 1-2 tablet  1-2 tablet Oral Q4H PRN Ralene Bathe, PA      . piperacillin-tazobactam (ZOSYN) IVPB 3.375 g  3.375 g Intravenous Q8H [name], MD   3.375 g at 06/28/11 1438  . polyethylene glycol (MIRALAX / GLYCOLAX) packet 17 g  17 g Oral Daily PRN Ravisankar R Avva, MD      . potassium chloride SA (K-DUR,KLOR-CON) CR tablet 20 mEq  20 mEq Oral Daily Ravisankar R Avva, MD   20 mEq at 06/28/11 1005  . senna-docusate (Senokot-S) tablet 2 tablet  2 tablet Oral Once [name], MD   2 tablet at 06/28/11 1330  . sodium chloride 0.9 % injection 3 mL  3 mL Intravenous Q12H  Ravisankar R Avva, MD   3 mL at 06/28/11 1016  . vancomycin (VANCOCIN) IVPB 1000 mg/200 mL premix  1,000 mg Intravenous Q12H Tracy Shuford, PA   1,000 mg at 06/27/11 1654  . warfarin (COUMADIN) tablet 0.5 mg  0.5 mg Oral ONCE-1800 Annia Belt, [place]      . Warfarin - Pharmacist Dosing Inpatient   Does not apply q1800 Annia Belt, PHARMD      . DISCONTD: warfarin (COUMADIN) tablet 2.5 mg  2.5 mg Oral ONCE-1800 Annia Belt, [place]        Physical Exam: General appearance: alert and no distress Neck: no adenopathy, no carotid bruit, no JVD, supple, symmetrical, trachea midline and thyroid not enlarged, symmetric, no tenderness/mass/nodules Lungs: clear to auscultation bilaterally Heart: regular rate and rhythm, S1, S2 normal and systolic murmur: 1-6/1 SEM Abdomen: soft, non-tender; bowel sounds normal; no masses,  no organomegaly and there is ecchymosis of the scrotum and over the pubic symphysis, there is also ecchymosis over the left thigh to the left buttocks. Extremities: extremities normal, atraumatic, no cyanosis or edema Pulses: 2+ and symmetric Skin: pale Neurologic: Grossly normal  Lab Results: Results for orders placed during the hospital encounter of 06/26/11 (from the past 48 hour(s))  CBC     Status: Abnormal   Collection Time   06/26/11  4:15 PM      Component Value Range Comment   WBC 14.1 (*) 4.0 - 10.5 (K/uL)    RBC 4.08 (*) 4.22 - 5.81 (MIL/uL)    Hemoglobin 12.4 (*) 13.0 - 17.0 (g/dL)    HCT 09.6 (*) 04.5 - 52.0 (%)    MCV 89.2  78.0 - 100.0 (fL)    MCH 30.4  26.0 - 34.0 (pg)    MCHC 34.1  30.0 - 36.0 (g/dL)    RDW 40.9 (*) 81.1 - 15.5 (%)    Platelets 137 (*) 150 - 400 (K/uL)   DIFFERENTIAL     Status: Abnormal   Collection Time   06/26/11  4:15 PM      Component Value Range Comment   Neutrophils Relative 85 (*) 43 - 77 (%)    Lymphocytes Relative 7 (*) 12 - 46 (%)    Monocytes Relative 6  3 - 12 (%)  Eosinophils Relative 2  0 - 5 (%)     Basophils Relative 0  0 - 1 (%)    Neutro Abs 12.0 (*) 1.7 - 7.7 (K/uL)    Lymphs Abs 1.0  0.7 - 4.0 (K/uL)    Monocytes Absolute 0.8  0.1 - 1.0 (K/uL)    Eosinophils Absolute 0.3  0.0 - 0.7 (K/uL)    Basophils Absolute 0.0  0.0 - 0.1 (K/uL)    Smear Review MORPHOLOGY UNREMARKABLE     BASIC METABOLIC PANEL     Status: Abnormal   Collection Time   06/26/11  4:15 PM      Component Value Range Comment   Sodium 138  135 - 145 (mEq/L)    Potassium 3.5  3.5 - 5.1 (mEq/L)    Chloride 103  96 - 112 (mEq/L)    CO2 26  19 - 32 (mEq/L)    Glucose, Bld 172 (*) 70 - 99 (mg/dL)    BUN 17  6 - 23 (mg/dL)    Creatinine, Ser 4.09  0.50 - 1.35 (mg/dL)    Calcium 8.9  8.4 - 10.5 (mg/dL)    GFR calc non Af Amer 72 (*) >90 (mL/min)    GFR calc Af Amer 84 (*) >90 (mL/min)   APTT     Status: Abnormal   Collection Time   06/26/11  4:15 PM      Component Value Range Comment   aPTT 38 (*) 24 - 37 (seconds)   PROTIME-INR     Status: Abnormal   Collection Time   06/26/11  4:15 PM      Component Value Range Comment   Prothrombin Time 21.3 (*) 11.6 - 15.2 (seconds)    INR 1.81 (*) 0.00 - 1.49    CULTURE, BLOOD (ROUTINE X 2)     Status: Normal (Preliminary result)   Collection Time   06/26/11  7:42 PM      Component Value Range Comment   Specimen Description BLOOD RIGHT ARM      Special Requests BOTTLES DRAWN AEROBIC AND ANAEROBIC 5CC      Culture  Setup Time [id number]      Culture        Value:        BLOOD CULTURE RECEIVED NO GROWTH TO DATE CULTURE WILL BE HELD FOR 5 DAYS BEFORE ISSUING A FINAL NEGATIVE REPORT   Report Status PENDING     CULTURE, BLOOD (ROUTINE X 2)     Status: Normal (Preliminary result)   Collection Time   06/26/11  7:52 PM      Component Value Range Comment   Specimen Description BLOOD RIGHT HAND      Special Requests BOTTLES DRAWN AEROBIC ONLY 4CC      Culture  Setup Time [id number]      Culture        Value:        BLOOD CULTURE RECEIVED NO GROWTH TO DATE CULTURE WILL BE  HELD FOR 5 DAYS BEFORE ISSUING A FINAL NEGATIVE REPORT   Report Status PENDING     GLUCOSE, CAPILLARY     Status: Abnormal   Collection Time   06/26/11 10:41 PM      Component Value Range Comment   Glucose-Capillary 317 (*) 70 - 99 (mg/dL)   GLUCOSE, CAPILLARY     Status: Abnormal   Collection Time   06/27/11  9:40 AM      Component Value Range Comment   Glucose-Capillary 216 (*) 70 -  99 (mg/dL)    Comment 1 Documented in Chart     GLUCOSE, CAPILLARY     Status: Abnormal   Collection Time   06/27/11 11:55 AM      Component Value Range Comment   Glucose-Capillary 199 (*) 70 - 99 (mg/dL)   GLUCOSE, CAPILLARY     Status: Abnormal   Collection Time   06/27/11 10:52 PM      Component Value Range Comment   Glucose-Capillary 251 (*) 70 - 99 (mg/dL)   PROTIME-INR     Status: Abnormal   Collection Time   06/28/11  5:08 AM      Component Value Range Comment   Prothrombin Time 25.6 (*) 11.6 - 15.2 (seconds)    INR 2.29 (*) 0.00 - 1.49    CBC     Status: Abnormal   Collection Time   06/28/11  5:08 AM      Component Value Range Comment   WBC 11.3 (*) 4.0 - 10.5 (K/uL)    RBC 2.28 (*) 4.22 - 5.81 (MIL/uL)    Hemoglobin 7.0 (*) 13.0 - 17.0 (g/dL)    HCT 16.1 (*) 09.6 - 52.0 (%)    MCV 89.0  78.0 - 100.0 (fL)    MCH 30.7  26.0 - 34.0 (pg)    MCHC 34.5  30.0 - 36.0 (g/dL)    RDW 04.5 (*) 40.9 - 15.5 (%)    Platelets 90 (*) 150 - 400 (K/uL)   PREPARE RBC (CROSSMATCH)     Status: Normal   Collection Time   06/28/11  7:00 AM      Component Value Range Comment   Order Confirmation NO CURRENT SAMPLE, MUST ORDER TYPE AND SCREEN     GLUCOSE, CAPILLARY     Status: Abnormal   Collection Time   06/28/11  7:26 AM      Component Value Range Comment   Glucose-Capillary 202 (*) 70 - 99 (mg/dL)   TYPE AND SCREEN     Status: Normal (Preliminary result)   Collection Time   06/28/11  8:37 AM      Component Value Range Comment   ABO/RH(D) O POS      Antibody Screen NEG      Sample Expiration 07/01/2011       Unit Number [id number]      Blood Component Type RBC LR PHER1      Unit division 00      Status of Unit ALLOCATED      Transfusion Status OK TO TRANSFUSE      Crossmatch Result Compatible      Unit Number [id number]      Blood Component Type RBC LR PHER1      Unit division 00      Status of Unit ALLOCATED      Transfusion Status OK TO TRANSFUSE      Crossmatch Result Compatible     ABO/RH     Status: Normal   Collection Time   06/28/11  8:37 AM      Component Value Range Comment   ABO/RH(D) O POS     PREPARE RBC (CROSSMATCH)     Status: Normal   Collection Time   06/28/11 10:00 AM      Component Value Range Comment   Order Confirmation ORDER PROCESSED BY BLOOD BANK       Imaging: Dg Chest 1 View  06/26/2011  *RADIOLOGY REPORT*  Clinical Data:  Fall, hip pain  CHEST - 1 VIEW  Comparison:  Chest radiograph 12/06/ 2012  Findings: Sternotomy wires overlie stable cardiac silhouette. There is mild air space disease in the right lower lobe.  No pleural fluid.  No pneumothorax.  No evidence of acute fracture. Remote fracture of a left posterior lateral rib.  IMPRESSION: Concern for right lobe pneumonia.  Original Report Authenticated By: Genevive Bi, M.D.   Dg Elbow Complete Right  06/26/2011  *RADIOLOGY REPORT*  Clinical Data: Injury from fall.  Pain.  Swelling.  RIGHT ELBOW - COMPLETE 3+ VIEW  Comparison: None.  Findings: No joint effusion is evident.  Alignment is normal.  No fracture or dislocation is evident.  IMPRESSION: No fracture or dislocation.  Original Report Authenticated By: Crawford Givens, M.D.   Dg Hip Complete Right  06/26/2011  *RADIOLOGY REPORT*  Clinical Data: Status post fall.  RIGHT HIP - COMPLETE 2+ VIEW  Comparison: None  Findings: Comminuted intertrochanteric fracture of the proximal right femur is identified.  No dislocation.  No significant angulation identified.  Mild impaction of the fracture fragments noted.  Metallic stents are identified within the iliac vessels  bilaterally.  IMPRESSION:  1. 1.  Comminuted, intertrochanteric fracture of the proximal right femur.  Original Report Authenticated By: Rosealee Albee, M.D.   Dg Hip Operative Right  06/27/2011  *RADIOLOGY REPORT*  Clinical Data: The fracture  OPERATIVE RIGHT HIP  Comparison: 06/26/2011  Findings: Dynamic compression screw and intramedullary rod are now in place transfixing an intertrochanteric right femur fracture. One distal interlocking screw.  Mild displacement of the lesser trochanter.  IMPRESSION: ORIF intertrochanteric femur fracture.  Original Report Authenticated By: Donavan Burnet, M.D.    Assessment:  1. Principal Problem: 2.  *Closed right hip fracture 3. Active Problems: 4.  DIABETES MELLITUS, TYPE II 5.  HYPERTENSION 6.  PVD, Rt SFA PTA/HSRA 06/17/11 7.  S/P aortic valve replacement: #23 Magna Ease Edwards Pericardial Valve   8.  Leukocytosis 9.  Persistent atrial fibrillation,  10.   Plan:  1. Labs indicate new severe anemia with H/H of 7/20. There is diffuse ecchymosis of the scrotum and above the pubic symphysis. I would consider non-contrast CT of the abdomen and pelvis to r/o retroperitoneal bleeding. He is already ordered for 2 units of PRBC's and may need another.  Would watch volume status as he may need lasix. INR is 2.29.    Time Spent Directly with Patient:  15 minutes  Length of Stay:  LOS: 2 days   Chrystie Nose, MD, [facility] Attending Cardiologist [facility]  Britanni Yarde C 06/28/2011, 3:40 PM

## 2011-06-28 NOTE — Progress Notes (Signed)
Clinical Social Work Department BRIEF PSYCHOSOCIAL ASSESSMENT 06/28/2011  Patient:  Ronald [name], Ronald [name]     Account Number:  [id number]     Admit date:  06/26/2011  Clinical Social Worker:  Orpah Greek  Date/Time:  06/28/2011 10:36 AM  Referred by:  Physician  Date Referred:  06/28/2011 Referred for  SNF Placement   Other Referral:   Interview type:  Patient Other interview type:    PSYCHOSOCIAL DATA Living Status:  ALONE Admitted from facility:   Level of care:   Primary support name:  Ronald [name] Primary support relationship to patient:  CHILD, ADULT Degree of support available:   good    CURRENT CONCERNS Current Concerns  Post-Acute Placement   Other Concerns:    SOCIAL WORK ASSESSMENT / PLAN CSW spoke with patient regarding discharge planning. MD recommending SNF for rehab.   Assessment/plan status:  Information/Referral to [facility] Other assessment/ plan:   Information/referral to community resources:   CSW completed FL2 and faxed out to [facility]. Weekend CSW to F/U on bed offers.    PATIENT'S/FAMILY'S RESPONSE TO PLAN OF CARE: Pt seemed disappointed to hear probable SNF but is accepting of MD recommendation.        Unice Bailey, LCSWA [phone]

## 2011-06-29 ENCOUNTER — Inpatient Hospital Stay (HOSPITAL_COMMUNITY): Payer: Medicare Other

## 2011-06-29 LAB — CBC
HCT: 24.4 % — ABNORMAL LOW (ref 39.0–52.0)
MCH: 30.2 pg (ref 26.0–34.0)
MCHC: 34.4 g/dL (ref 30.0–36.0)
MCV: 87.8 fL (ref 78.0–100.0)
RDW: 16.3 % — ABNORMAL HIGH (ref 11.5–15.5)

## 2011-06-29 LAB — GLUCOSE, CAPILLARY
Glucose-Capillary: 207 mg/dL — ABNORMAL HIGH (ref 70–99)
Glucose-Capillary: 272 mg/dL — ABNORMAL HIGH (ref 70–99)

## 2011-06-29 LAB — PROTIME-INR: INR: 1.63 — ABNORMAL HIGH (ref 0.00–1.49)

## 2011-06-29 LAB — HEMOGLOBIN AND HEMATOCRIT, BLOOD
HCT: 24 % — ABNORMAL LOW (ref 39.0–52.0)
Hemoglobin: 8.4 g/dL — ABNORMAL LOW (ref 13.0–17.0)

## 2011-06-29 MED ORDER — BISACODYL 10 MG RE SUPP
10.0000 mg | Freq: Every day | RECTAL | Status: DC | PRN
  Filled 2011-06-29: qty 1

## 2011-06-29 MED ORDER — INSULIN ASPART 100 UNIT/ML ~~LOC~~ SOLN
3.0000 [IU] | Freq: Three times a day (TID) | SUBCUTANEOUS | Status: DC
  Administered 2011-06-29 – 2011-07-02 (×10): 3 [IU] via SUBCUTANEOUS

## 2011-06-29 MED ORDER — INSULIN GLARGINE 100 UNIT/ML ~~LOC~~ SOLN
15.0000 [IU] | Freq: Every day | SUBCUTANEOUS | Status: DC
  Administered 2011-06-29: 15 [IU] via SUBCUTANEOUS

## 2011-06-29 MED ORDER — FLEET ENEMA 7-19 GM/118ML RE ENEM
1.0000 | ENEMA | Freq: Every day | RECTAL | Status: DC | PRN
  Administered 2011-06-30: 1 via RECTAL
  Filled 2011-06-29: qty 1

## 2011-06-29 MED ORDER — WARFARIN SODIUM 5 MG PO TABS
5.0000 mg | ORAL_TABLET | Freq: Once | ORAL | Status: DC
  Filled 2011-06-29: qty 1

## 2011-06-29 NOTE — Progress Notes (Signed)
[facility]  DAILY PROGRESS NOTE   Subjective:  Doing well today. Less pain. CT yesterday shows bleeding/hematoma of the right thigh. No significant retroperitoneal bleeding. H/H improved after transfusion.  Objective:  Temp:  [98.1 F (36.7 C)-99.8 F (37.7 C)] 99.5 F (37.5 C) (05/04 0514) Pulse Rate:  [66-89] 83  (05/04 0514) Resp:  [16-20] 20  (05/04 0514) BP: (110-156)/(50-70) 120/50 mmHg (05/04 0514) SpO2:  [91 %-98 %] 94 % (05/04 0514) Weight change:   Intake/Output from previous day: 05/03 0701 - 05/04 0700 In: 885.3 [P.O.:360; I.V.:80; Blood:295.3; IV Piggyback:150] Out: 2350 [Urine:2350]  Intake/Output from this shift: Total I/O In: 605 [I.V.:605] Out: -   Medications: Current Facility-Administered Medications  Medication Dose Route Frequency Provider Last Rate Last Dose  . acetaminophen (TYLENOL) tablet 650 mg  650 mg Oral Q6H PRN Ralene Bathe, PA       Or  . acetaminophen (TYLENOL) suppository 650 mg  650 mg Rectal Q6H PRN Ralene Bathe, PA      . acetaminophen (TYLENOL) tablet 650 mg  650 mg Oral Once [employer], PA   650 mg at 06/28/11 1610  . atorvastatin (LIPITOR) tablet 10 mg  10 mg Oral QHS [name], MD   10 mg at 06/28/11 2226  . benazepril (LOTENSIN) tablet 10 mg  10 mg Oral Daily [name], MD   10 mg at 06/29/11 0935  . bisacodyl (DULCOLAX) suppository 10 mg  10 mg Rectal Daily PRN [name], MD      . diazepam (VALIUM) injection 5 mg  5 mg Intravenous Q6H PRN Ralene Bathe, PA      . diltiazem (DILACOR XR) 24 hr capsule 240 mg  240 mg Oral Daily Ravisankar R Avva, MD   240 mg at 06/29/11 0935  . diphenhydrAMINE (BENADRYL) capsule 25 mg  25 mg Oral Once [employer], PA   25 mg at 06/28/11 1610  . Fluticasone-Salmeterol (ADVAIR) 250-50 MCG/DOSE inhaler 1 puff  1 puff Inhalation BID Ravisankar R Avva, MD   1 puff at 06/28/11 2040  . furosemide (LASIX) injection 20 mg  20 mg Intravenous Once [name], MD    20 mg at 06/29/11 0039  . furosemide (LASIX) injection 20 mg  20 mg Intravenous Once [employer], PA   20 mg at 06/28/11 2045  . HYDROcodone-acetaminophen (NORCO) 5-325 MG per tablet 1-2 tablet  1-2 tablet Oral Q4H PRN Ralene Bathe, PA   2 tablet at 06/29/11 0935  . HYDROmorphone (DILAUDID) injection 0.5 mg  0.5 mg Intravenous Q2H PRN Ralene Bathe, PA   0.5 mg at 06/29/11 0815  . insulin aspart (novoLOG) injection 0-9 Units  0-9 Units Subcutaneous TID WC Ravisankar R Avva, MD   3 Units at 06/29/11 0804  . insulin aspart (novoLOG) injection 3 Units  3 Units Subcutaneous TID WC [name], MD   3 Units at 06/29/11 [phone]  . insulin glargine (LANTUS) injection 15 Units  15 Units Subcutaneous QHS [name], MD      . lactated ringers infusion   Intravenous Continuous [name], MD 75 mL/hr at 06/27/11 1156 75 mL/hr at 06/27/11 1156  . loratadine (CLARITIN) tablet 10 mg  10 mg Oral Daily Ravisankar R Avva, MD   10 mg at 06/29/11 0935  . menthol-cetylpyridinium (CEPACOL) lozenge 3 mg  1 lozenge Oral PRN Ralene Bathe, PA       Or  . phenol (CHLORASEPTIC) mouth spray 1  spray  1 spray Mouth/Throat PRN Ralene Bathe, PA      . metoCLOPramide (REGLAN) tablet 5-10 mg  5-10 mg Oral Q8H PRN Ralene Bathe, PA       Or  . metoCLOPramide (REGLAN) injection 5-10 mg  5-10 mg Intravenous Q8H PRN Ralene Bathe, PA      . metoprolol (LOPRESSOR) tablet 50 mg  50 mg Oral BID Ravisankar R Avva, MD   50 mg at 06/29/11 0935  . ondansetron (ZOFRAN) tablet 4 mg  4 mg Oral Q6H PRN Ralene Bathe, PA       Or  . ondansetron (ZOFRAN) injection 4 mg  4 mg Intravenous Q6H PRN Ralene Bathe, PA      . oxyCODONE-acetaminophen (PERCOCET) 5-325 MG per tablet 1-2 tablet  1-2 tablet Oral Q4H PRN Ralene Bathe, PA      . polyethylene glycol (MIRALAX / GLYCOLAX) packet 17 g  17 g Oral Daily PRN Ravisankar R Avva, MD      . potassium chloride SA (K-DUR,KLOR-CON) CR tablet 20 mEq  20 mEq Oral Daily Ravisankar R Avva, MD   20 mEq  at 06/29/11 0935  . senna-docusate (Senokot-S) tablet 2 tablet  2 tablet Oral Once [name], MD   2 tablet at 06/28/11 1330  . sodium chloride 0.9 % injection 3 mL  3 mL Intravenous Q12H Ravisankar R Avva, MD   3 mL at 06/29/11 0934  . sodium phosphate (FLEET) 7-19 GM/118ML enema 1 enema  1 enema Rectal Daily PRN [name], MD      . warfarin (COUMADIN) tablet 0.5 mg  0.5 mg Oral ONCE-1800 Annia Belt, PHARMD   0.5 mg at 06/28/11 1740  . Warfarin - Pharmacist Dosing Inpatient   Does not apply q1800 Annia Belt, PHARMD      . DISCONTD: insulin glargine (LANTUS) injection 10 Units  10 Units Subcutaneous QHS [name], MD   10 Units at 06/28/11 2241  . DISCONTD: piperacillin-tazobactam (ZOSYN) IVPB 3.375 g  3.375 g Intravenous Q8H [name], MD   3.375 g at 06/29/11 0330  . DISCONTD: warfarin (COUMADIN) tablet 5 mg  5 mg Oral ONCE-1800 [name], MD        Physical Exam: General appearance: alert and no distress Neck: no adenopathy, no carotid bruit, no JVD, supple, symmetrical, trachea midline and thyroid not enlarged, symmetric, no tenderness/mass/nodules Lungs: clear to auscultation bilaterally Heart: S1, S2 normal Abdomen: soft, non-tender; bowel sounds normal; no masses,  no organomegaly Extremities: right thigh is firm, posterior eccyhmosis, scrotal ecchymosis and bruising over left thigh  Lab Results: Results for orders placed during the hospital encounter of 06/26/11 (from the past 48 hour(s))  GLUCOSE, CAPILLARY     Status: Abnormal   Collection Time   06/27/11 11:55 AM      Component Value Range Comment   Glucose-Capillary 199 (*) 70 - 99 (mg/dL)   GLUCOSE, CAPILLARY     Status: Abnormal   Collection Time   06/27/11  4:49 PM      Component Value Range Comment   Glucose-Capillary 207 (*) 70 - 99 (mg/dL)   GLUCOSE, CAPILLARY     Status: Abnormal   Collection Time   06/27/11 10:52 PM      Component Value Range Comment   Glucose-Capillary 251 (*) 70 -  99 (mg/dL)   PROTIME-INR     Status: Abnormal   Collection Time   06/28/11  5:08 AM  Component Value Range Comment   Prothrombin Time 25.6 (*) 11.6 - 15.2 (seconds)    INR 2.29 (*) 0.00 - 1.49    CBC     Status: Abnormal   Collection Time   06/28/11  5:08 AM      Component Value Range Comment   WBC 11.3 (*) 4.0 - 10.5 (K/uL)    RBC 2.28 (*) 4.22 - 5.81 (MIL/uL)    Hemoglobin 7.0 (*) 13.0 - 17.0 (g/dL)    HCT 11.9 (*) 14.7 - 52.0 (%)    MCV 89.0  78.0 - 100.0 (fL)    MCH 30.7  26.0 - 34.0 (pg)    MCHC 34.5  30.0 - 36.0 (g/dL)    RDW 82.9 (*) 56.2 - 15.5 (%)    Platelets 90 (*) 150 - 400 (K/uL)   PREPARE RBC (CROSSMATCH)     Status: Normal   Collection Time   06/28/11  7:00 AM      Component Value Range Comment   Order Confirmation NO CURRENT SAMPLE, MUST ORDER TYPE AND SCREEN     GLUCOSE, CAPILLARY     Status: Abnormal   Collection Time   06/28/11  7:26 AM      Component Value Range Comment   Glucose-Capillary 202 (*) 70 - 99 (mg/dL)   TYPE AND SCREEN     Status: Normal   Collection Time   06/28/11  8:37 AM      Component Value Range Comment   ABO/RH(D) O POS      Antibody Screen NEG      Sample Expiration 07/01/2011      Unit Number [id number]      Blood Component Type RBC LR PHER1      Unit division 00      Status of Unit ISSUED,FINAL      Transfusion Status OK TO TRANSFUSE      Crossmatch Result Compatible      Unit Number [id number]      Blood Component Type RBC LR PHER1      Unit division 00      Status of Unit ISSUED,FINAL      Transfusion Status OK TO TRANSFUSE      Crossmatch Result Compatible     ABO/RH     Status: Normal   Collection Time   06/28/11  8:37 AM      Component Value Range Comment   ABO/RH(D) O POS     PREPARE RBC (CROSSMATCH)     Status: Normal   Collection Time   06/28/11 10:00 AM      Component Value Range Comment   Order Confirmation ORDER PROCESSED BY BLOOD BANK     GLUCOSE, CAPILLARY     Status: Abnormal   Collection Time   06/28/11  2:05 PM        Component Value Range Comment   Glucose-Capillary 214 (*) 70 - 99 (mg/dL)   GLUCOSE, CAPILLARY     Status: Abnormal   Collection Time   06/28/11  5:15 PM      Component Value Range Comment   Glucose-Capillary 225 (*) 70 - 99 (mg/dL)   GLUCOSE, CAPILLARY     Status: Abnormal   Collection Time   06/28/11  9:11 PM      Component Value Range Comment   Glucose-Capillary 237 (*) 70 - 99 (mg/dL)    Comment 1 Notify RN     PROTIME-INR     Status: Abnormal   Collection Time   06/29/11  5:02 AM      Component Value Range Comment   Prothrombin Time 19.6 (*) 11.6 - 15.2 (seconds)    INR 1.63 (*) 0.00 - 1.49    CBC     Status: Abnormal   Collection Time   06/29/11  5:02 AM      Component Value Range Comment   WBC 13.3 (*) 4.0 - 10.5 (K/uL)    RBC 2.78 (*) 4.22 - 5.81 (MIL/uL)    Hemoglobin 8.4 (*) 13.0 - 17.0 (g/dL)    HCT 16.1 (*) 09.6 - 52.0 (%)    MCV 87.8  78.0 - 100.0 (fL)    MCH 30.2  26.0 - 34.0 (pg)    MCHC 34.4  30.0 - 36.0 (g/dL)    RDW 04.5 (*) 40.9 - 15.5 (%)    Platelets 97 (*) 150 - 400 (K/uL) CONSISTENT WITH PREVIOUS RESULT  GLUCOSE, CAPILLARY     Status: Abnormal   Collection Time   06/29/11  7:46 AM      Component Value Range Comment   Glucose-Capillary 182 (*) 70 - 99 (mg/dL)     Imaging: Ct Abdomen Pelvis Wo Contrast  06/29/2011  *RADIOLOGY REPORT*  Clinical Data: Anticoagulated patient with recent ORIF of a right hip fracture, now with increased edema and bruising extending upward into the pelvis.  Evaluate for retroperitoneal hematoma.  CT ABDOMEN AND PELVIS WITHOUT CONTRAST 06/29/2011:  Technique:  Multidetector CT imaging of the abdomen and pelvis was performed following the standard protocol without intravenous contrast.  Comparison: CT abdomen pelvis without and with contrast 05/10/2008 [facility].  Findings: Respiratory motion blurred images of the upper abdomen, though there a study is diagnostic.  Asymmetric enlargement of the right gluteal  muscles, the right pectineus and obturator externus muscles, muscles, and the proximal medial and lateral compartment muscles of the upper right thigh, consistent with intramuscular hemorrhage/hematoma.  A subcutaneous hematoma is present overlying the right lower pelvis at the surgical site, and gas is present within the soft tissues related to the recent surgery.  There is no evidence of right piriformis muscle hematoma, and streaky density in the presacral space may represent edema or hemorrhage; if this does represent hemorrhage, it is a very small amount of retroperitoneal hemorrhage.  Normal unenhanced appearance of the liver, spleen, right adrenal gland, and right kidney.  Mild pancreatic atrophy.  Approximate 2.6 cm simple cyst arising from the upper pole of the left kidney, unchanged; no significant abnormalities involving the left kidney. Mild enlargement the left adrenal gland, unchanged, without nodularity.  Gallbladder contracted as there is food within the stomach, but is normal by CT.  No biliary ductal dilation.  Aorto- iliofemoral distal artery atherosclerosis, with left renal artery and bilateral common iliac artery stents.  Stomach and small bowel normal in appearance.  Sigmoid colon diverticulosis without evidence of acute diverticulitis.  Moderate stool burden.  Normal appendix in the right upper pelvis.  No ascites.  Moderate median lobe prostate gland enlargement.  Gas within the urinary bladder, presumably related to recent instrumentation.  Normal seminal vesicles.  Bone window images demonstrate the comminuted intertrochanteric right femoral neck fracture with ORIF, generalized osteopenia, old compression fracture of T12, degenerative changes in the sacroiliac joints, and degenerative changes involving the lower thoracic and lumbar spine. Heart enlarged.  Moderate bilateral pleural effusions and associated passive atelectasis in the lower lobes.  IMPRESSION:  1.  Moderate sized  hemorrhage/hematoma involving the right gluteal muscles, right pectineus and obturator externus muscles,  and the proximal medial and lateral muscle groups of the upper thigh. 2.  Edema versus a very small amount of retroperitoneal hemorrhage in the presacral space.  No large retroperitoneal hemorrhage is identified. 3.  Small subcutaneous hematoma underlying the surgical site laterally in the right lower pelvis. 5.  Stable mild left adrenal hyperplasia. 6.  Stable mild pancreatic atrophy. 7.  Sigmoid colon diverticulosis without evidence of acute diverticulitis. 8.  Stable moderate median lobe prostate gland enlargement.  Original Report Authenticated By: Arnell Sieving, M.D.   Dg Chest 2 View  06/29/2011  *RADIOLOGY REPORT*  Clinical Data: Shortness of breath.  Status post hip surgery.  CHEST - 2 VIEW  Comparison: 06/26/2011.  Findings: Stable surgical changes from bypass surgery.  An atrial appendage closure device is noted.  The heart is borderline enlarged but stable. Bibasilar atelectasis is again demonstrated. No pleural effusion.  IMPRESSION: Persistent bibasilar atelectasis.  Original Report Authenticated By: P. Loralie Champagne, M.D.    Assessment:  1. Principal Problem: 2.  *Closed right hip fracture 3. Active Problems: 4.  DIABETES MELLITUS, TYPE II 5.  HYPERTENSION 6.  PVD, Rt SFA PTA/HSRA 06/17/11 7.  S/P aortic valve replacement: #23 Magna Ease Edwards Pericardial Valve   8.  Leukocytosis 9.  Persistent atrial fibrillation,  10.   Plan:  1. CT demonstrates hemorrhage/hematoma of the right glueteal and upper thigh muscles at the surgical site. He received 2U PRBC's yesterday, however, H/H response is less than expected which could suggest ongoing hemorrhage. Would follow- H/H Q8 hrs .Marland Kitchen Consider repeat transfusion if below 7/21. INR is now 1.63 - coumadin being held - I agree. Ok off of anticoagulation with tissue valve.     Time Spent Directly with Patient:  15 minutes  Length  of Stay:  LOS: 3 days   Chrystie Nose, MD, [name] Attending Cardiologist [facility]  Aavya Shafer C 06/29/2011, 11:29 AM

## 2011-06-29 NOTE — Progress Notes (Signed)
Subjective: 2 Days Post-Op Procedure(s) (LRB): INTRAMEDULLARY (IM) NAIL FEMORAL (Right) Patient reports pain as moderate.    Objective: Vital signs in last 24 hours: Temp:  [98.1 F (36.7 C)-99.8 F (37.7 C)] 99.5 F (37.5 C) (05/04 0514) Pulse Rate:  [66-89] 83  (05/04 0514) Resp:  [16-20] 20  (05/04 0514) BP: (110-156)/(50-70) 120/50 mmHg (05/04 0514) SpO2:  [91 %-98 %] 94 % (05/04 0514)  Intake/Output from previous day: 05/03 0701 - 05/04 0700 In: 885.3 [P.O.:360; I.V.:80; Blood:295.3; IV Piggyback:150] Out: 2350 [Urine:2350] Intake/Output this shift: Total I/O In: 605 [I.V.:605] Out: -    Basename 06/29/11 0502 06/28/11 0508 06/26/11 1615  HGB 8.4* 7.0* 12.4*    Basename 06/29/11 0502 06/28/11 0508  WBC 13.3* 11.3*  RBC 2.78* 2.28*  HCT 24.4* 20.3*  PLT 97* 90*    Basename 06/26/11 1615  NA 138  K 3.5  CL 103  CO2 26  BUN 17  CREATININE 0.96  GLUCOSE 172*  CALCIUM 8.9    Basename 06/29/11 0502 06/28/11 0508  LABPT -- --  INR 1.63* 2.29*    Incision: dressing C/D/I No calf tenderness No change neurovascular status    Assessment/Plan: 2 Days Post-Op Procedure(s) (LRB): INTRAMEDULLARY (IM) NAIL FEMORAL (Right) Up with therapy H/H improved s/p transfusion  Margean Korell A 06/29/2011, 9:39 AM

## 2011-06-29 NOTE — Progress Notes (Addendum)
ANTICOAGULATION CONSULT NOTE - Follow Up  Pharmacy Consult for Warfarin Indication: Afib  Allergies  Allergen Reactions  . Aspirin Shortness Of Breath and Rash    Patient Measurements: Height: 5\' 11"  (180.3 cm) Weight: 178 lb 9.2 oz (81 kg) IBW/kg (Calculated) : 75.3   Vital Signs: Temp: 99.5 F (37.5 C) (05/04 0514) Temp src: Oral (05/04 0514) BP: 120/50 mmHg (05/04 0514) Pulse Rate: 83  (05/04 0514)  Labs:  Basename 06/29/11 0502 06/28/11 0508 06/26/11 1615  HGB 8.4* 7.0* --  HCT 24.4* 20.3* 36.4*  PLT 97* 90* 137*  APTT -- -- 38*  LABPROT 19.6* 25.6* 21.3*  INR 1.63* 2.29* 1.81*  HEPARINUNFRC -- -- --  CREATININE -- -- 0.96  CKTOTAL -- -- --  CKMB -- -- --  TROPONINI -- -- --   Estimated Creatinine Clearance: 57.7 ml/min (by C-G formula based on Cr of 0.96).  Medical History: Past Medical History  Diagnosis Date  . AS (aortic stenosis)   . DM2 (diabetes mellitus, type 2)   . HTN (hypertension)   . Heart murmur   . Thrombocytopenia due to drugs     seen by [name] plts [phone] no rx  . Cancer     Bladder Cancer local  . Peripheral vascular disease very poor circulation legs and feet ... stents right and left legs... done in [name] 's office.   . Depression wife died 4 years ago.    Marland Kitchen COPD (chronic obstructive pulmonary disease)   . Dysrhythmia 5-7 weeks ago ... sees dr Doroteo Glassman fibrilation  . Shortness of breath   . Recurrent upper respiratory infection (URI)     sinusitis  . Neuropathy due to secondary diabetes   . Chronic kidney disease     renal artery stent  . Claudication in peripheral vascular disease 06/17/2011  . S/P angioplasty with stent, diamond back rotational athrectomy Prox. Rt. SFA 06/17/2011 06/17/2011    Medications:  Scheduled:     . acetaminophen  650 mg Oral Once  . atorvastatin  10 mg Oral QHS  . benazepril  10 mg Oral Daily  . diltiazem  240 mg Oral Daily  . diphenhydrAMINE  25 mg Oral Once  .  Fluticasone-Salmeterol  1 puff Inhalation BID  . furosemide  20 mg Intravenous Once  . furosemide  20 mg Intravenous Once  . insulin aspart  0-9 Units Subcutaneous TID WC  . insulin aspart  3 Units Subcutaneous TID WC  . insulin glargine  15 Units Subcutaneous QHS  . loratadine  10 mg Oral Daily  . metoprolol  50 mg Oral BID  . piperacillin-tazobactam (ZOSYN)  IV  3.375 g Intravenous Q8H  . potassium chloride SA  20 mEq Oral Daily  . senna-docusate  2 tablet Oral Once  . sodium chloride  3 mL Intravenous Q12H  . warfarin  0.5 mg Oral ONCE-1800  . Warfarin - Pharmacist Dosing Inpatient   Does not apply q1800  . DISCONTD: insulin glargine  10 Units Subcutaneous QHS  . DISCONTD: warfarin  2.5 mg Oral ONCE-1800   Infusions:     . lactated ringers 75 mL/hr (06/27/11 1156)   PRN: acetaminophen, acetaminophen, bisacodyl, diazepam, HYDROcodone-acetaminophen, HYDROmorphone (DILAUDID) injection, menthol-cetylpyridinium, metoCLOPramide (REGLAN) injection, metoCLOPramide, ondansetron (ZOFRAN) IV, ondansetron, oxyCODONE-acetaminophen, phenol, polyethylene glycol, sodium phosphate  Assessment:  78 yo M on chronic warfarin for hx of Afib. Hx of bioprostehtic AVR (per H&P).  PVD: Recent atherectomy of the right lower extremity approximately  9 days ago.  Admitted 5/1 s/p fall resulting in right femur fracture. Now s/p IM femoral nail and to resume warfarin dosing tonight.  Home warfarin dose = 2.5mg  daily  INR jumped from 1.81 to 2.29 with no Coumadin given the night before so only 0.5mg  given last night. This am INR is back down to 1.63.  Hgb improved to 8.4 after transfusion.  Reports of scrotal/pelvic ecchymosis per cardiology note. CT abdomen/pelvis to r/o bleeding is pending.  Goal of Therapy:  INR 2-3   Plan:   Warfarin 2.5mg  PO x1 at 18:00.  F/U daily INR and result of CT.  Charolotte Eke, PharmD, pager [phone]. 06/29/2011,8:47 AM.   Addendum: spoke with Dr. Timothy Lasso, CT revealed  hematoma. Holding Coumadin for now. Pharmacy will cont to f/u for resumption of anticoag.  Charolotte Eke, PharmD, pager [phone]. 06/29/2011,10:30 AM.

## 2011-06-29 NOTE — Progress Notes (Signed)
Physical Therapy Treatment Patient Details Name: Ronald [name] MRN: [id number] DOB: 1922/06/27 Today's Date: 06/29/2011 Time: 4098-1191 PT Time Calculation (min): 41 min  PT Assessment / Plan / Recommendation Comments on Treatment Session       Follow Up Recommendations  Skilled nursing facility (possible [facility] if family willing/able to assist at home)    Equipment Recommendations  Defer to next venue    Frequency Min 5X/week   Plan      Precautions / Restrictions Precautions Precautions: Fall Restrictions Weight Bearing Restrictions: No Other Position/Activity Restrictions: WBAT   Pertinent Vitals/Pain 5/10 with movement, R LE hip    Mobility  Bed Mobility Bed Mobility: Sitting - Scoot to Edge of Bed;Sit to Supine Supine to Sit: 1: +2 Total assist;With rails;HOB elevated Supine to Sit: Patient Percentage: 30% Sitting - Scoot to Edge of Bed: 2: Max assist Details for Bed Mobility Assistance: pt using Lt LE to scoot buttock to EOB. Pt required extended time and verbalizing every small step in sequence to have confidence to progress. Pt is motivated to participate but anxious of pain. Pt reports no pain until movement of RT LE Transfers Transfers: Sit to Stand Sit to Stand: 1: +2 Total assist;From elevated surface;With upper extremity assist;From bed Sit to Stand: Patient Percentage: 70% Stand to Sit: 1: +2 Total assist;To elevated surface;To chair/3-in-1;With upper extremity assist;With armrests Stand to Sit: Patient Percentage: 70% Ambulation/Gait Ambulation/Gait Assistance: 1: +2 Total assist Ambulation/Gait: Patient Percentage: 80 Ambulation Distance (Feet): 10 Feet Assistive device: Rolling walker Gait Pattern: Step-to pattern Gait velocity: slow, very labored General Gait Details: still needing assit to progress RLE fowrad with gait Stairs: No    Exercises General Exercises - Lower Extremity Long Arc Quad: AAROM;Right;10 reps;Seated (tight quad, very slow for pt  tolerance)   PT Goals Acute Rehab PT Goals PT Goal Formulation: With patient Time For Goal Achievement: 07/13/11 Potential to Achieve Goals: Good Pt will go Supine/Side to Sit: with supervision Pt will go Sit to Supine/Side: with supervision Pt will go Sit to Stand: with supervision Pt will go Stand to Sit: with supervision Pt will Ambulate: 16 - 50 feet;with rolling walker;with supervision Pt will Go Up / Down Stairs: 1-2 stairs;with rolling walker;with min assist  Visit Information  Last PT Received On: 06/29/11 Assistance Needed: +2 PT/OT Co-Evaluation/Treatment: Yes    Subjective Data      Cognition  Overall Cognitive Status: Appears within functional limits for tasks assessed/performed Arousal/Alertness: Awake/alert Orientation Level: Appears intact for tasks assessed Behavior During Session: [facility] for tasks performed    Balance     End of Session PT - End of Session Equipment Utilized During Treatment: Gait belt Activity Tolerance: Patient limited by pain;Patient tolerated treatment well Patient left: in chair;with call bell/phone within reach Nurse Communication: Mobility status (will return today at 1:00pm to assist pt back to bed)    Karlen Barbar, [facility] 06/29/2011, 3:31 PM Marella Bile, PT Pager: [phone] 06/29/2011

## 2011-06-29 NOTE — Evaluation (Signed)
Occupational Therapy Evaluation Patient Details Name: Ronald [name] MRN: [id number] DOB: [birth year] Today's Date: 06/29/2011 Time: 4098-1191 OT Time Calculation (min): 48 min  OT Assessment / Plan / Recommendation Clinical Impression  78 yo male s/p Im ORIF Rt hip that could benefit from skilled OT acutely. See problem list below. Recommend SNF for d/c planning    OT Assessment  Patient needs continued OT Services    Follow Up Recommendations  Skilled nursing facility    Equipment Recommendations  Defer to next venue    Frequency Min 2X/week    Precautions / Restrictions Precautions Precautions: Fall Restrictions Weight Bearing Restrictions: No Other Position/Activity Restrictions: WBAT   Pertinent Vitals/Pain o-10 pain 10 out 10 pain with Rt LE PROM Minimal pain standing    ADL  Eating/Feeding: Performed;Set up Where Assessed - Eating/Feeding: Chair Grooming: Performed;Brushing hair;Set up Where Assessed - Grooming: Supported sitting Upper Body Bathing: Simulated;Chest;Right arm;Left arm;Abdomen;Maximal assistance (unsupported sitting) Where Assessed - Upper Body Bathing: Unsupported;Sitting, bed Lower Body Bathing: Performed;Moderate assistance Upper Body Dressing:  (able to don Lt sock with sock partically don) Lower Body Dressing: Performed;Moderate assistance Where Assessed - Lower Body Dressing: Supine, head of bed up Toilet Transfer: Simulated;+1 Total assistance;Comment for patient % (pt =50%) Toilet Transfer Method: Proofreader: Raised toilet seat with arms (or 3-in-1 over toilet) Toileting - Clothing Manipulation: Simulated;+1 Total assistance Where Assessed - Toileting Clothing Manipulation: Sit to stand from 3-in-1 or toilet Toileting - Hygiene: Simulated;+1 Total assistance Where Assessed - Toileting Hygiene: Sit to stand from 3-in-1 or toilet Ambulation Related to ADLs: pt ambulated ~8 ft with facilitation initially to shift  weight and (A) to advance Rt LE. Pt required Mod v/c for hand placement and to keep RW close to the body. Pt tend s to push RW too far ahead of pt   ADL Comments: Pt anxious to move due to pain during transfer for procedure this visit. Pt increasing speed with mobility and requiring v/c for RW placement with increased confidence this session. Pt is able to direct his care and does best when allowed extra time to verbalize directions to care. Pt motivated to return to work.     OT Goals Acute Rehab OT Goals OT Goal Formulation: With patient Time For Goal Achievement: 07/13/11 Potential to Achieve Goals: Good ADL Goals Pt Will Perform Grooming: with supervision;Standing at sink ADL Goal: Grooming - Progress: Goal set today Pt Will Perform Upper Body Bathing: with set-up;Sitting, chair;Sitting, edge of bed;Supported ADL Goal: Upper Body Bathing - Progress: Goal set today Pt Will Perform Upper Body Dressing: with set-up;Sitting, chair;Sitting, bed;Supported ADL Goal: Upper Body Dressing - Progress: Goal set today Pt Will Transfer to Toilet: with mod assist;3-in-1;Ambulation ADL Goal: Toilet Transfer - Progress: Goal set today Pt Will Perform Toileting - Clothing Manipulation: with mod assist;Standing ADL Goal: Toileting - Clothing Manipulation - Progress: Goal set today Pt Will Perform Toileting - Hygiene: with mod assist;Sitting on 3-in-1 or toilet ADL Goal: Toileting - Hygiene - Progress: Goal set today Miscellaneous OT Goals Miscellaneous OT Goal #1: Pt will perform bed mobility with Max (A) hob as precursor to Adls OT Goal: Miscellaneous Goal #1 - Progress: Goal set today  Visit Information  Last OT Received On: 06/29/11 PT/OT Co-Evaluation/Treatment: Yes    Subjective Data  Subjective: "its hard to take a simple product and get it into the market" "ive almost got all my grandchildrent through college only have 2 kids left" Patient Stated Goal: complete my side  projects to get them to  market   Prior Functioning  Home Living Lives With: Alone Available Help at Discharge: Family;Available 24 hours/day (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 working but own the company) Type of Home: House Home Access: Stairs to enter Secretary/administrator of Steps: 1 Entrance Stairs-Rails: None (Rails at front door with 2 steps) Home Layout: One level (has basement for recretional needs) Bathroom Shower/Tub: Tub/shower unit;Curtain Bathroom Toilet: Standard Bathroom Accessibility: Yes How Accessible: Accessible via walker;Accessible via wheelchair Home Adaptive Equipment: Hand-held shower hose;Grab bars around toilet;Grab bars in shower;Shower chair with back;Wheelchair - manual;Wheelchair - powered;Walker - rolling;Walker - standard;Bedside commode/3-in-1 Prior Function Level of Independence: Independent Able to Take Stairs?: Yes Driving: Yes Vocation: Full time employment Communication Communication: No difficulties Dominant Hand: Right    Cognition  Overall Cognitive Status: Appears within functional limits for tasks assessed/performed Arousal/Alertness: Awake/alert Orientation Level: Appears intact for tasks assessed Behavior During Session: [facility] for tasks performed    Extremity/Trunk Assessment Right Upper Extremity Assessment RUE ROM/Strength/Tone: Within functional levels;WFL for tasks assessed RUE Sensation: WFL - Light Touch RUE Coordination: WFL - gross/fine motor Left Upper Extremity Assessment LUE ROM/Strength/Tone: Within functional levels;WFL for tasks assessed LUE Sensation: WFL - Light Touch LUE Coordination: WFL - gross/fine motor Trunk Assessment Trunk Assessment: Normal   Mobility Bed Mobility Bed Mobility: Supine to Sit;Sitting - Scoot to Edge of Bed Supine to Sit: 1: +2 Total assist;With rails;HOB elevated Supine to Sit: Patient Percentage: 30% Sitting - Scoot to Edge of Bed: 2: Max assist Details for Bed Mobility Assistance: pt using Lt LE to scoot buttock to EOB. Pt  required extended time and verbalizing every small step in sequence to have confidence to progress. Pt is motivated to participate but anxious of pain. Pt reports no pain until movement of RT LE Transfers Transfers: Sit to Stand;Stand to Sit Sit to Stand: 1: +2 Total assist;From elevated surface;With upper extremity assist;From bed Sit to Stand: Patient Percentage: 60% Stand to Sit: 1: +2 Total assist;To elevated surface;To chair/3-in-1;With upper extremity assist;With armrests Stand to Sit: Patient Percentage: 60%   Exercise    Balance    End of Session OT - End of Session Equipment Utilized During Treatment: Gait belt Activity Tolerance: Patient tolerated treatment well Patient left: in chair;with call bell/phone within reach Nurse Communication: Mobility status   Harrel Carina [facility] 06/29/2011, 11:21 AM Pager: [phone]

## 2011-06-29 NOTE — Evaluation (Signed)
Physical Therapy Evaluation Patient Details Name: Ronald [name] MRN: [id number] DOB: 1922/10/05 Today's Date: 06/29/2011 Time: 4098-1191 PT Time Calculation (min): 48 min  PT Assessment / Plan / Recommendation Clinical Impression  Pt limited by pain post op, and with decreased mobility for transfers, ambulations and all mobilyt.  Will require skilled PT to return to home alone in active state as he wishes to be.  May required SNF , will conintue to assess as pt progresses.     PT Assessment  Patient needs continued PT services    Follow Up Recommendations  Skilled nursing facility (possible [facility] if family willing/able to assist at home)    Equipment Recommendations  Defer to next venue    Frequency Min 5X/week    Precautions / Restrictions Precautions Precautions: Fall Restrictions Weight Bearing Restrictions: No Other Position/Activity Restrictions: WBAT   Pertinent Vitals/Pain  see listed pain/vital in section      Mobility  Bed Mobility Bed Mobility: Supine to Sit;Sitting - Scoot to Edge of Bed Supine to Sit: 1: +2 Total assist;With rails;HOB elevated Supine to Sit: Patient Percentage: 30% Sitting - Scoot to Edge of Bed: 2: Max assist Details for Bed Mobility Assistance: pt using Lt LE to scoot buttock to EOB. Pt required extended time and verbalizing every small step in sequence to have confidence to progress. Pt is motivated to participate but anxious of pain. Pt reports no pain until movement of RT LE Transfers Transfers: Sit to Stand Sit to Stand: 1: +2 Total assist;From elevated surface;With upper extremity assist;From bed Sit to Stand: Patient Percentage: 60% Stand to Sit: 1: +2 Total assist;To elevated surface;To chair/3-in-1;With upper extremity assist;With armrests Stand to Sit: Patient Percentage: 60% Ambulation/Gait Ambulation/Gait Assistance: 1: +2 Total assist Ambulation/Gait: Patient Percentage: 80 Ambulation Distance (Feet): 7 Feet Assistive device:  Rolling walker Ambulation/Gait Assistance Details: required max cues for gait sequencing and moderate physical assistance to progress L LE for step.   Gait Pattern: Step-to pattern Gait velocity: slow, very labored Stairs: No    Exercises General Exercises - Lower Extremity Ankle Circles/Pumps: AROM;10 reps;Both Heel Slides: AAROM;Right;10 reps;Supine;Limitations Heel Slides Limitations: AROM L, but assist with Right and very little movement allowed by patient Hip ABduction/ADduction: AAROM;Right;Limitations Hip Abduction/Adduction Limitations: AROM L, but assist with Right and very little movement allowed by patient   PT Goals Acute Rehab PT Goals PT Goal Formulation: With patient Time For Goal Achievement: 07/13/11 Potential to Achieve Goals: Good Pt will go Supine/Side to Sit: with supervision PT Goal: Supine/Side to Sit - Progress: Goal set today Pt will go Sit to Supine/Side: with supervision PT Goal: Sit to Supine/Side - Progress: Goal set today Pt will go Sit to Stand: with supervision PT Goal: Sit to Stand - Progress: Goal set today Pt will go Stand to Sit: with supervision PT Goal: Stand to Sit - Progress: Goal set today Pt will Ambulate: 16 - 50 feet;with rolling walker;with supervision PT Goal: Ambulate - Progress: Goal set today Pt will Go Up / Down Stairs: 1-2 stairs;with rolling walker;with min assist PT Goal: Up/Down Stairs - Progress: Goal set today  Visit Information  Last PT Received On: 06/29/11 Assistance Needed: +2 PT/OT Co-Evaluation/Treatment: Yes    Subjective Data  Subjective: Just be carfeful, I have a lot of pain when I move it or tighten my muscles.  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 the company) Type of Home: House Home Access: Stairs to  enter Secretary/administrator of Steps: 1 Entrance Stairs-Rails: None (Rails at front door with 2 steps) Home Layout: One level (has basement for recretional needs) Bathroom Shower/Tub: Tub/shower unit;Curtain Bathroom Toilet: Standard Bathroom Accessibility: Yes How Accessible: Accessible via walker;Accessible via wheelchair Home Adaptive Equipment: Hand-held shower hose;Grab bars around toilet;Grab bars in shower;Shower chair with back;Wheelchair - manual;Wheelchair - powered;Walker - rolling;Walker - standard;Bedside commode/3-in-1 Prior Function Level of Independence: Independent Able to Take Stairs?: Yes Driving: Yes Vocation: Full time employment Communication Communication: No difficulties Dominant Hand: Right    Cognition  Overall Cognitive Status: Appears within functional limits for tasks assessed/performed Arousal/Alertness: Awake/alert Orientation Level: Appears intact for tasks assessed Behavior During Session: [facility] for tasks performed    Extremity/Trunk Assessment Right Upper Extremity Assessment RUE ROM/Strength/Tone: Within functional levels;WFL for tasks assessed RUE Sensation: WFL - Light Touch RUE Coordination: WFL - gross/fine motor Left Upper Extremity Assessment LUE ROM/Strength/Tone: Within functional levels;WFL for tasks assessed LUE Sensation: WFL - Light Touch LUE Coordination: WFL - gross/fine motor Trunk Assessment Trunk Assessment: Normal   Balance    End of Session PT - End of Session Equipment Utilized During Treatment: Gait belt Activity Tolerance: Patient limited by pain;Patient tolerated treatment well Patient left: in chair;with call bell/phone within reach Nurse Communication: Mobility status (will return today at 1:00pm to assist pt back to bed)   Talyah Seder, [facility] 06/29/2011, 12:35 PM  Marella Bile, PT Pager: [phone] 06/29/2011

## 2011-06-29 NOTE — Progress Notes (Signed)
ANTIBIOTIC CONSULT NOTE - follow up  Pharmacy Consult for Zosyn Indication: pneumonia  Allergies  Allergen Reactions  . Aspirin Shortness Of Breath and Rash    Patient Measurements: Height: 5\' 11"  (180.3 cm) Weight: 178 lb 9.2 oz (81 kg) IBW/kg (Calculated) : 75.3   Vital Signs: Temp: 99.5 F (37.5 C) (05/04 0514) Temp src: Oral (05/04 0514) BP: 120/50 mmHg (05/04 0514) Pulse Rate: 83  (05/04 0514) Intake/Output from previous day: 05/03 0701 - 05/04 0700 In: 885.3 [P.O.:360; I.V.:80; Blood:295.3; IV Piggyback:150] Out: 2350 [Urine:2350] Intake/Output from this shift: Total I/O In: 605 [I.V.:605] Out: -   Labs:  Basename 06/29/11 0502 06/28/11 0508 06/26/11 1615  WBC 13.3* 11.3* 14.1*  HGB 8.4* 7.0* 12.4*  PLT 97* 90* 137*  LABCREA -- -- --  CREATININE -- -- 0.96   Estimated Creatinine Clearance: 57.7 ml/min (by C-G formula based on Cr of 0.96). No results found for this basename: VANCOTROUGH:2,VANCOPEAK:2,VANCORANDOM:2,GENTTROUGH:2,GENTPEAK:2,GENTRANDOM:2,TOBRATROUGH:2,TOBRAPEAK:2,TOBRARND:2,AMIKACINPEAK:2,AMIKACINTROU:2,AMIKACIN:2, in the last 72 hours   Microbiology: Recent Results (from the past 720 hour(s))  CULTURE, BLOOD (ROUTINE X 2)     Status: Normal (Preliminary result)   Collection Time   06/26/11  7:42 PM      Component Value Range Status Comment   Specimen Description BLOOD RIGHT ARM   Final    Special Requests BOTTLES DRAWN AEROBIC AND ANAEROBIC 5CC   Final    Culture  Setup Time [id number]   Final    Culture     Final    Value:        BLOOD CULTURE RECEIVED NO GROWTH TO DATE CULTURE WILL BE HELD FOR 5 DAYS BEFORE ISSUING A FINAL NEGATIVE REPORT   Report Status PENDING   Incomplete   CULTURE, BLOOD (ROUTINE X 2)     Status: Normal (Preliminary result)   Collection Time   06/26/11  7:52 PM      Component Value Range Status Comment   Specimen Description BLOOD RIGHT HAND   Final    Special Requests BOTTLES DRAWN AEROBIC ONLY 4CC   Final    Culture   Setup Time [id number]   Final    Culture     Final    Value:        BLOOD CULTURE RECEIVED NO GROWTH TO DATE CULTURE WILL BE HELD FOR 5 DAYS BEFORE ISSUING A FINAL NEGATIVE REPORT   Report Status PENDING   Incomplete     Medical History: Past Medical History  Diagnosis Date  . AS (aortic stenosis)   . DM2 (diabetes mellitus, type 2)   . HTN (hypertension)   . Heart murmur   . Thrombocytopenia due to drugs     seen by [name] plts 114000 no rx  . Cancer     Bladder Cancer local  . Peripheral vascular disease very poor circulation legs and feet ... stents right and left legs... done in [name] 's office.   . Depression wife died 4 years ago.    Marland Kitchen COPD (chronic obstructive pulmonary disease)   . Dysrhythmia 5-7 weeks ago ... sees dr Doroteo Glassman fibrilation  . Shortness of breath   . Recurrent upper respiratory infection (URI)     sinusitis  . Neuropathy due to secondary diabetes   . Chronic kidney disease     renal artery stent  . Claudication in peripheral vascular disease 06/17/2011  . S/P angioplasty with stent, diamond back rotational athrectomy Prox. Rt. SFA 06/17/2011 06/17/2011   Medications:  Prescriptions  prior to admission  Medication Sig Dispense Refill  . benazepril (LOTENSIN) 10 MG tablet Take 10 mg by mouth daily.      . clopidogrel (PLAVIX) 75 MG tablet Take 1 tablet (75 mg total) by mouth daily.      Marland Kitchen diltiazem (DILACOR XR) 240 MG 24 hr capsule Take 240 mg by mouth daily.      Marland Kitchen doxazosin (CARDURA) 4 MG tablet Take 4 mg by mouth at bedtime.      . finasteride (PROSCAR) 5 MG tablet Take 5 mg by mouth daily.        . Fluticasone-Salmeterol (ADVAIR) 250-50 MCG/DOSE AEPB Inhale 1 puff into the lungs 2 (two) times daily.  28 each  0  . furosemide (LASIX) 20 MG tablet Take 20 mg by mouth daily.        Marland Kitchen glyBURIDE (DIABETA) 5 MG tablet Take 5-10 mg by mouth See admin instructions. 2 tablets in the morning and 1 tablet in the evening      . loratadine  (CLARITIN) 10 MG tablet Take 1 tablet (10 mg total) by mouth daily.      . metoprolol (LOPRESSOR) 50 MG tablet Take 50 mg by mouth 2 (two) times daily.      . potassium chloride SA (K-DUR,KLOR-CON) 20 MEQ tablet Take 20 mEq by mouth daily.      . simvastatin (ZOCOR) 20 MG tablet Take 20 mg by mouth at bedtime.        Marland Kitchen warfarin (COUMADIN) 5 MG tablet Take 2.5 mg by mouth daily.        Anti-infectives     Start     Dose/Rate Route Frequency Ordered Stop   06/27/11 1400   vancomycin (VANCOCIN) IVPB 1000 mg/200 mL premix        1,000 mg 200 mL/hr over 60 Minutes Intravenous Every 12 hours 06/27/11 1044 06/27/11 1754   06/27/11 0400   piperacillin-tazobactam (ZOSYN) IVPB 3.375 g        3.375 g 12.5 mL/hr over 240 Minutes Intravenous Every 8 hours 06/26/11 2144     06/26/11 1930   ceFAZolin (ANCEF) IVPB 2 g/50 mL premix  Status:  Discontinued        2 g 100 mL/hr over 30 Minutes Intravenous 60 min pre-op 06/26/11 1930 06/27/11 1044   06/26/11 1800   vancomycin (VANCOCIN) IVPB 1000 mg/200 mL premix        1,000 mg 200 mL/hr over 60 Minutes Intravenous  Once 06/26/11 1746 06/26/11 2207   06/26/11 1800   piperacillin-tazobactam (ZOSYN) IVPB 3.375 g        3.375 g 100 mL/hr over 30 Minutes Intravenous  Once 06/26/11 1746 06/26/11 2023   06/26/11 1615   ceFAZolin (ANCEF) IVPB 1 g/50 mL premix        1 g 100 mL/hr over 30 Minutes Intravenous  Once 06/26/11 1601 06/26/11 1855         Assessment:  78yo M on chronic Coumadin, fell and fractured hip. S/p ORIF 5/3.  Day #4 empiric Zosyn for suspected pneumonia.  SCr wnl, last checked on 5/1.  Blood cx ngtd.  Goal of Therapy:  Renally-appropriate dosing.  Plan:  Zosyn 3.375g IV Q8H infused over 4hrs. Check Bmet in am. F/u daily.  Charolotte Eke, PharmD, pager [phone]. 06/29/2011,8:38 AM.

## 2011-06-29 NOTE — Progress Notes (Addendum)
Subjective: 6 male with significant vasculopathy, AFib, recent AVR, DM2, COPD (still smoking) had a traumatic fall and R Hip fractutre. POD #2 ORIF per Dr Rennis Chris No SOB, No CP.   Currently AFib HR in 90's CBGs high and he is back on Lantus - needing increase. No other complaints.  R hip is sore and more swollen Also being treated empirically for possible PNA. Less pain. Received Bowel Prep yesterday.  Objective: Vital signs in last 24 hours: Temp:  [98.1 F (36.7 C)-99.8 F (37.7 C)] 99.5 F (37.5 C) (05/04 0514) Pulse Rate:  [66-89] 83  (05/04 0514) Resp:  [16-20] 20  (05/04 0514) BP: (110-156)/(50-70) 120/50 mmHg (05/04 0514) SpO2:  [90 %-98 %] 94 % (05/04 0514) Weight change:  Last BM Date: 06/26/11  CBG (last 3)   Basename 06/28/11 2111 06/28/11 1715 06/28/11 1405  GLUCAP 237* 225* 214*    Intake/Output from previous day:  Intake/Output Summary (Last 24 hours) at 06/29/11 0736 Last data filed at 06/29/11 0704  Gross per 24 hour  Intake 1490.33 ml  Output   2150 ml  Net -659.67 ml   05/03 0701 - 05/04 0700 In: 885.3 [P.O.:360; I.V.:80; Blood:295.3; IV Piggyback:150] Out: 2150 [Urine:2150]   Physical Exam  General appearance: A & O Resp: CTA Cardio: irreg, Sternotomy GI: soft, non-tender; bowel sounds normal; no masses,  no organomegaly Extremities: R Hip CDI.  More bruising and swollen - bruising extends into pelvis Some edema. ?Hematoma   Lab Results:  Basename 06/26/11 1615  NA 138  K 3.5  CL 103  CO2 26  GLUCOSE 172*  BUN 17  CREATININE 0.96  CALCIUM 8.9  MG --  PHOS --    No results found for this basename: AST:2,ALT:2,ALKPHOS:2,BILITOT:2,PROT:2,ALBUMIN:2 in the last 72 hours   Basename 06/29/11 0502 06/28/11 0508 06/26/11 1615  WBC 13.3* 11.3* --  NEUTROABS -- -- 12.0*  HGB 8.4* 7.0* --  HCT 24.4* 20.3* --  MCV 87.8 89.0 --  PLT 97* 90* --    Lab Results  Component Value Date   INR 1.63* 06/29/2011   INR 2.29* 06/28/2011   INR  1.81* 06/26/2011    No results found for this basename: CKTOTAL:3,CKMB:3,CKMBINDEX:3,TROPONINI:3 in the last 72 hours  No results found for this basename: TSH,T4TOTAL,FREET3,T3FREE,THYROIDAB in the last 72 hours  No results found for this basename: VITAMINB12:2,FOLATE:2,FERRITIN:2,TIBC:2,IRON:2,RETICCTPCT:2 in the last 72 hours  Micro Results: Recent Results (from the past 240 hour(s))  CULTURE, BLOOD (ROUTINE X 2)     Status: Normal (Preliminary result)   Collection Time   06/26/11  7:42 PM      Component Value Range Status Comment   Specimen Description BLOOD RIGHT ARM   Final    Special Requests BOTTLES DRAWN AEROBIC AND ANAEROBIC 5CC   Final    Culture  Setup Time [id number]   Final    Culture     Final    Value:        BLOOD CULTURE RECEIVED NO GROWTH TO DATE CULTURE WILL BE HELD FOR 5 DAYS BEFORE ISSUING A FINAL NEGATIVE REPORT   Report Status PENDING   Incomplete   CULTURE, BLOOD (ROUTINE X 2)     Status: Normal (Preliminary result)   Collection Time   06/26/11  7:52 PM      Component Value Range Status Comment   Specimen Description BLOOD RIGHT HAND   Final    Special Requests BOTTLES DRAWN AEROBIC ONLY 4CC   Final    Culture  Setup Time [id number]   Final    Culture     Final    Value:        BLOOD CULTURE RECEIVED NO GROWTH TO DATE CULTURE WILL BE HELD FOR 5 DAYS BEFORE ISSUING A FINAL NEGATIVE REPORT   Report Status PENDING   Incomplete      Studies/Results: Dg Hip Operative Right  06/27/2011  *RADIOLOGY REPORT*  Clinical Data: The fracture  OPERATIVE RIGHT HIP  Comparison: 06/26/2011  Findings: Dynamic compression screw and intramedullary rod are now in place transfixing an intertrochanteric right femur fracture. One distal interlocking screw.  Mild displacement of the lesser trochanter.  IMPRESSION: ORIF intertrochanteric femur fracture.  Original Report Authenticated By: Donavan Burnet, M.D.     Medications: Scheduled:    . acetaminophen  650 mg Oral Once  .  atorvastatin  10 mg Oral QHS  . benazepril  10 mg Oral Daily  . diltiazem  240 mg Oral Daily  . diphenhydrAMINE  25 mg Oral Once  . Fluticasone-Salmeterol  1 puff Inhalation BID  . furosemide  20 mg Intravenous Once  . furosemide  20 mg Intravenous Once  . insulin aspart  0-9 Units Subcutaneous TID WC  . insulin glargine  10 Units Subcutaneous QHS  . loratadine  10 mg Oral Daily  . metoprolol  50 mg Oral BID  . piperacillin-tazobactam (ZOSYN)  IV  3.375 g Intravenous Q8H  . potassium chloride SA  20 mEq Oral Daily  . senna-docusate  2 tablet Oral Once  . sodium chloride  3 mL Intravenous Q12H  . warfarin  0.5 mg Oral ONCE-1800  . Warfarin - Pharmacist Dosing Inpatient   Does not apply q1800  . DISCONTD: warfarin  2.5 mg Oral ONCE-1800   Continuous:    . lactated ringers 75 mL/hr (06/27/11 1156)     Assessment/Plan: Principal Problem:  *Closed right hip fracture Active Problems:  DIABETES MELLITUS, TYPE II  HYPERTENSION  PVD, Rt SFA PTA/HSRA 06/17/11  S/P aortic valve replacement: #23 Magna Ease Edwards Pericardial Valve    Leukocytosis  Persistent atrial fibrillation,    R Hip Fracture S/P - POD #2 ORIF  per Dr Rennis Chris.  Hemodynamically stable but Hbg post op = 7.0 - S/P transfusion 2 Units PRBC and still Lowish Hbg.    PT/OT/CW consulted.  FL2 signed. Anticipate DC to SNF on Monday if all goes well.  History of aortic valve replacement-hemodynamically stable, no evidence of volume overload or congestive heart failure.  Pt with AFib/RVR on Cardizem 360 and Lopressor 50 BID.  HR better and he is Asxatic.    Atrial fibrillation -  rate in 90s this am.    Currently on anticoagulation with coumadin and will continue if CT (-)... continue beta blocker and calcium channel blocker   Tobacco abuse, no evidence of bronchospasm on physical exam, currently on Advair inhaler.  we'll watch the need for nebulizer treatment and pulmonary toilet,  Quit Tobacco encouraged.  Hypertension  controlled.  Pneumonia-?,  Continue Zosyn and recheck CXR today. Will adjust and figure out Abx then  Post op Blood Loss anemia. - S/P Transfusion for Hbg of 7.0.  Follow up CBC post transfusion is 8.4 and OK but still low??.  Agree that Bruising and remaining Low Hbg is concerning so we will do CTAb/Pelvis w/out Contrast to R/Out Retroperitoneal bleed especially on coumadin.  Hyperlipidemia on Zocor   Constipation - Senakot S added.  Fleets and Suppository also added.  Peripheral vascular disease  S/P recent PTCI -  on Plavix, complicated by smoking, followed by Dr. Allyson Sabal  DM2 with complications- will continue sliding scale insulin, last hemoglobin A1c 8.1% within the last one month, complicated by vascular disease and potential neuropathy.  Lantus added and will be increased.  3 units Novolog c meals may soon be as well.   Basename 06/28/11 2111 06/28/11 1715 06/28/11 1405  GLUCAP 237* 225* 214*   ID -  Anti-infectives     Start     Dose/Rate Route Frequency Ordered Stop   06/27/11 1400   vancomycin (VANCOCIN) IVPB 1000 mg/200 mL premix        1,000 mg 200 mL/hr over 60 Minutes Intravenous Every 12 hours 06/27/11 1044 06/27/11 1754   06/27/11 0400  piperacillin-tazobactam (ZOSYN) IVPB 3.375 g       3.375 g 12.5 mL/hr over 240 Minutes Intravenous Every 8 hours 06/26/11 2144     06/26/11 1930   ceFAZolin (ANCEF) IVPB 2 g/50 mL premix  Status:  Discontinued        2 g 100 mL/hr over 30 Minutes Intravenous 60 min pre-op 06/26/11 1930 06/27/11 1044   06/26/11 1800   vancomycin (VANCOCIN) IVPB 1000 mg/200 mL premix        1,000 mg 200 mL/hr over 60 Minutes Intravenous  Once 06/26/11 1746 06/26/11 2207   06/26/11 1800  piperacillin-tazobactam (ZOSYN) IVPB 3.375 g       3.375 g 100 mL/hr over 30 Minutes Intravenous  Once 06/26/11 1746 06/26/11 2023   06/26/11 1615   ceFAZolin (ANCEF) IVPB 1 g/50 mL premix        1 g 100 mL/hr over 30 Minutes Intravenous  Once 06/26/11 1601  06/26/11 1855         DVT Prophylaxis    LOS: 3 days   Lyden Redner M 06/29/2011, 7:36 AM   The CT just came back with  1. Moderate sized hemorrhage/hematoma involving the right gluteal  muscles, right pectineus and obturator externus muscles, and the  proximal medial and lateral muscle groups of the upper thigh.  2. Edema versus a very small amount of retroperitoneal hemorrhage  in the presacral space. No large retroperitoneal hemorrhage is  identified.  3. Small subcutaneous hematoma underlying the surgical site  laterally in the right lower pelvis.  5. Stable mild left adrenal hyperplasia.  6. Stable mild pancreatic atrophy.  7. Sigmoid colon diverticulosis without evidence of acute  diverticulitis.  8. Stable moderate median lobe prostate gland enlargement.  Hold Coumadin 1-2 days (INR 1.6 and FFP not indicated - vit K may be needed) based on Hbg and Hematoma size.  Ortho to address. Squeezers for DVT Prophylaxis.  I have a page into Dr Lestine Box currently. I discussed with Pharm and ortho.   The CXR showed Persistent bibasilar atelectasis - will stop Abx as he does not have a clinical PNA.Marland Kitchen

## 2011-06-30 LAB — BASIC METABOLIC PANEL
GFR calc Af Amer: 75 mL/min — ABNORMAL LOW (ref >90)
GFR calc non Af Amer: 65 mL/min — ABNORMAL LOW (ref >90)
Glucose, Bld: 224 mg/dL — ABNORMAL HIGH (ref 70–99)
Potassium: 3.6 mEq/L (ref 3.5–5.1)
Sodium: 134 mEq/L — ABNORMAL LOW (ref 135–145)

## 2011-06-30 LAB — PROTIME-INR
INR: 1.5 — ABNORMAL HIGH (ref 0.00–1.49)
Prothrombin Time: 18.4 seconds — ABNORMAL HIGH (ref 11.6–15.2)

## 2011-06-30 LAB — CBC
Hemoglobin: 7.7 g/dL — ABNORMAL LOW (ref 13.0–17.0)
MCHC: 34.4 g/dL (ref 30.0–36.0)
RDW: 16.5 % — ABNORMAL HIGH (ref 11.5–15.5)

## 2011-06-30 LAB — PREPARE RBC (CROSSMATCH)

## 2011-06-30 MED ORDER — INSULIN GLARGINE 100 UNIT/ML ~~LOC~~ SOLN
18.0000 [IU] | Freq: Every day | SUBCUTANEOUS | Status: DC
  Administered 2011-06-30 – 2011-07-01 (×2): 18 [IU] via SUBCUTANEOUS

## 2011-06-30 MED ORDER — FUROSEMIDE 10 MG/ML IJ SOLN
20.0000 mg | Freq: Once | INTRAMUSCULAR | Status: AC
  Administered 2011-06-30: 20 mg via INTRAVENOUS
  Filled 2011-06-30: qty 2

## 2011-06-30 MED ORDER — PHYTONADIONE 5 MG PO TABS
2.5000 mg | ORAL_TABLET | Freq: Once | ORAL | Status: AC
  Administered 2011-06-30: 2.5 mg via ORAL
  Filled 2011-06-30: qty 1

## 2011-06-30 MED ORDER — BISACODYL 10 MG RE SUPP
10.0000 mg | Freq: Every day | RECTAL | Status: DC | PRN
  Administered 2011-06-30: 10 mg via RECTAL

## 2011-06-30 MED ORDER — SORBITOL 70 % SOLN
30.0000 mL | Freq: Every day | Status: DC | PRN
  Administered 2011-06-30: 30 mL via ORAL
  Filled 2011-06-30: qty 30

## 2011-06-30 NOTE — Progress Notes (Signed)
Clinical Social Work:  Weekend Coverage  Followed up with patient regarding [employer] and patient preference for SNF/rehab facility.  Reports his daughter Rafanan has been working and checking into SNFs but currently his choices are [facility] [address] or [facility].  Discussed with patient no openings at [facility], and patient agreeable to [facility].    Asked for CSW to call daughter.  Attempted to speak with daughter, but no answer, thus will have weekday CSW to follow up with patient.   DC plan SNF, unknown medically stable.  Ashley Jacobs, MSW LCSW Weekend coverage.

## 2011-06-30 NOTE — Progress Notes (Signed)
Physical Therapy Treatment Patient Details Name: Ronald [name] MRN: [id number] DOB: 1922/07/13 Today's Date: 06/30/2011 Time: 0902-0922 PT Time Calculation (min): 20 min  PT Assessment / Plan / Recommendation Comments on Treatment Session  Bed exercises only 2* low Hgb this morning, pt to receive blood. Pt tolerates only small amount of movement of R hip due to pain. Weak quad set. Will resume OOB after blood transfusion.     Follow Up Recommendations  Skilled nursing facility;Home health PT (SNF v. HHPT depending on progress and family support)    Equipment Recommendations  Defer to next venue    Frequency Min 5X/week   Plan Discharge plan remains appropriate    Precautions / Restrictions Precautions Precautions: Fall Restrictions Weight Bearing Restrictions: No Other Position/Activity Restrictions: WBAT   Pertinent Vitals/Pain *8/10 with movement; ice applied, pt premedicated**    Mobility  Bed Mobility Details for Bed Mobility Assistance: Deferred 2* Hgb 7.7, pt to receive blood today. Mobility held per RN request.     Exercises General Exercises - Lower Extremity Ankle Circles/Pumps: AROM;Both;15 reps;Supine Quad Sets: AROM;Right;5 reps;Supine Short Arc Quad: AAROM;Right;10 reps;Supine Heel Slides: AAROM;Right;15 reps;Supine Hip ABduction/ADduction: AAROM;Right;10 reps;Supine   PT Goals Acute Rehab PT Goals PT Goal Formulation: With patient Time For Goal Achievement: 07/13/11 Potential to Achieve Goals: Good Pt will go Supine/Side to Sit: with supervision PT Goal: Supine/Side to Sit - Progress: Not met Pt will go Sit to Supine/Side: with supervision PT Goal: Sit to Supine/Side - Progress: Not met Pt will go Sit to Stand: with supervision PT Goal: Sit to Stand - Progress: Not met Pt will go Stand to Sit: with supervision PT Goal: Stand to Sit - Progress: Not met Pt will Ambulate: 16 - 50 feet;with supervision;with rolling walker PT Goal: Ambulate - Progress: Not  met Pt will Go Up / Down Stairs: 1-2 stairs;with rolling walker;with min assist PT Goal: Up/Down Stairs - Progress: Not met  Visit Information  Last PT Received On: 06/30/11 Assistance Needed: +2    Subjective Data  Subjective: If I even think about my R leg it tenses up. There's a lot of pain when I move it.  Patient Stated Goal: get back to work   Cognition  Overall Cognitive Status: Appears within functional limits for tasks assessed/performed Arousal/Alertness: Awake/alert Orientation Level: Appears intact for tasks assessed Behavior During Session: [facility] for tasks performed    Balance     End of Session PT - End of Session Activity Tolerance: Patient limited by pain Patient left: in bed;with call bell/phone within reach Nurse Communication: Mobility status    Ralene Bathe Kistler 06/30/2011, 10:12 AM [phone]

## 2011-06-30 NOTE — Progress Notes (Signed)
Subjective: 37 male with significant vasculopathy, AFib, recent AVR, DM2, COPD (still smoking) had a traumatic fall and R Hip fractutre. POD #3 ORIF per Dr Rennis Chris No SOB, No CP.   Currently AFib HR controlled 83-87 CBGs improving but still high. No other complaints.   R hip is more sore and more swollen and Hbg dropped more.  CT showed large Hematoma. Just discussed case with ortho. Xray was (-) and empiric Abx d/ced. . Receiving Bowel Prep and no movement.  Objective: Vital signs in last 24 hours: Temp:  [98.8 F (37.1 C)-99.6 F (37.6 C)] 99.6 F (37.6 C) (05/05 0416) Pulse Rate:  [73-80] 78  (05/05 0416) Resp:  [16-95] 17  (05/05 0416) BP: (128-143)/(51-66) 143/51 mmHg (05/05 0416) SpO2:  [18 %-96 %] 92 % (05/05 0416) Weight change:  Last BM Date: 06/26/11  CBG (last 3)   Basename 06/29/11 2109 06/29/11 1712 06/29/11 1204  GLUCAP 272* 211* 274*    Intake/Output from previous day:  Intake/Output Summary (Last 24 hours) at 06/30/11 0734 Last data filed at 06/30/11 0100  Gross per 24 hour  Intake    480 ml  Output    500 ml  Net    -20 ml   05/04 0701 - 05/05 0700 In: 1085 [P.O.:480; I.V.:605] Out: 500 [Urine:500]   Physical Exam  General appearance: A & O Resp: CTA Cardio: irreg, Sternotomy GI: soft, non-tender; bowel sounds normal; no masses,  no organomegaly Extremities: R Hip CDI.  More bruising and swollen - bruising extends into pelvis/buttocks and R flank Some edema.   Lab Results:  Basename 06/30/11 0530  NA 134*  K 3.6  CL 99  CO2 26  GLUCOSE 224*  BUN 17  CREATININE 1.01  CALCIUM 8.4  MG --  PHOS --    No results found for this basename: AST:2,ALT:2,ALKPHOS:2,BILITOT:2,PROT:2,ALBUMIN:2 in the last 72 hours   Basename 06/30/11 0530 06/29/11 1223 06/29/11 0502  WBC 12.6* -- 13.3*  NEUTROABS -- -- --  HGB 7.7* 8.4* --  HCT 22.4* 24.0* --  MCV 88.5 -- 87.8  PLT 101* -- 97*    Lab Results  Component Value Date   INR 1.50*  06/30/2011   INR 1.63* 06/29/2011   INR 2.29* 06/28/2011    No results found for this basename: CKTOTAL:3,CKMB:3,CKMBINDEX:3,TROPONINI:3 in the last 72 hours  No results found for this basename: TSH,T4TOTAL,FREET3,T3FREE,THYROIDAB in the last 72 hours  No results found for this basename: VITAMINB12:2,FOLATE:2,FERRITIN:2,TIBC:2,IRON:2,RETICCTPCT:2 in the last 72 hours  Micro Results: Recent Results (from the past 240 hour(s))  CULTURE, BLOOD (ROUTINE X 2)     Status: Normal (Preliminary result)   Collection Time   06/26/11  7:42 PM      Component Value Range Status Comment   Specimen Description BLOOD RIGHT ARM   Final    Special Requests BOTTLES DRAWN AEROBIC AND ANAEROBIC 5CC   Final    Culture  Setup Time [id number]   Final    Culture     Final    Value:        BLOOD CULTURE RECEIVED NO GROWTH TO DATE CULTURE WILL BE HELD FOR 5 DAYS BEFORE ISSUING A FINAL NEGATIVE REPORT   Report Status PENDING   Incomplete   CULTURE, BLOOD (ROUTINE X 2)     Status: Normal (Preliminary result)   Collection Time   06/26/11  7:52 PM      Component Value Range Status Comment   Specimen Description BLOOD RIGHT HAND   Final  Special Requests BOTTLES DRAWN AEROBIC ONLY 4CC   Final    Culture  Setup Time [id number]   Final    Culture     Final    Value:        BLOOD CULTURE RECEIVED NO GROWTH TO DATE CULTURE WILL BE HELD FOR 5 DAYS BEFORE ISSUING A FINAL NEGATIVE REPORT   Report Status PENDING   Incomplete      Studies/Results: Ct Abdomen Pelvis Wo Contrast  06/29/2011  *RADIOLOGY REPORT*  Clinical Data: Anticoagulated patient with recent ORIF of a right hip fracture, now with increased edema and bruising extending upward into the pelvis.  Evaluate for retroperitoneal hematoma.  CT ABDOMEN AND PELVIS WITHOUT CONTRAST 06/29/2011:  Technique:  Multidetector CT imaging of the abdomen and pelvis was performed following the standard protocol without intravenous contrast.  Comparison: CT abdomen pelvis without  and with contrast 05/10/2008 [facility].  Findings: Respiratory motion blurred images of the upper abdomen, though there a study is diagnostic.  Asymmetric enlargement of the right gluteal muscles, the right pectineus and obturator externus muscles, muscles, and the proximal medial and lateral compartment muscles of the upper right thigh, consistent with intramuscular hemorrhage/hematoma.  A subcutaneous hematoma is present overlying the right lower pelvis at the surgical site, and gas is present within the soft tissues related to the recent surgery.  There is no evidence of right piriformis muscle hematoma, and streaky density in the presacral space may represent edema or hemorrhage; if this does represent hemorrhage, it is a very small amount of retroperitoneal hemorrhage.  Normal unenhanced appearance of the liver, spleen, right adrenal gland, and right kidney.  Mild pancreatic atrophy.  Approximate 2.6 cm simple cyst arising from the upper pole of the left kidney, unchanged; no significant abnormalities involving the left kidney. Mild enlargement the left adrenal gland, unchanged, without nodularity.  Gallbladder contracted as there is food within the stomach, but is normal by CT.  No biliary ductal dilation.  Aorto- iliofemoral distal artery atherosclerosis, with left renal artery and bilateral common iliac artery stents.  Stomach and small bowel normal in appearance.  Sigmoid colon diverticulosis without evidence of acute diverticulitis.  Moderate stool burden.  Normal appendix in the right upper pelvis.  No ascites.  Moderate median lobe prostate gland enlargement.  Gas within the urinary bladder, presumably related to recent instrumentation.  Normal seminal vesicles.  Bone window images demonstrate the comminuted intertrochanteric right femoral neck fracture with ORIF, generalized osteopenia, old compression fracture of T12, degenerative changes in the sacroiliac joints, and degenerative  changes involving the lower thoracic and lumbar spine. Heart enlarged.  Moderate bilateral pleural effusions and associated passive atelectasis in the lower lobes.  IMPRESSION:  1.  Moderate sized hemorrhage/hematoma involving the right gluteal muscles, right pectineus and obturator externus muscles, and the proximal medial and lateral muscle groups of the upper thigh. 2.  Edema versus a very small amount of retroperitoneal hemorrhage in the presacral space.  No large retroperitoneal hemorrhage is identified. 3.  Small subcutaneous hematoma underlying the surgical site laterally in the right lower pelvis. 5.  Stable mild left adrenal hyperplasia. 6.  Stable mild pancreatic atrophy. 7.  Sigmoid colon diverticulosis without evidence of acute diverticulitis. 8.  Stable moderate median lobe prostate gland enlargement.  Original Report Authenticated By: Arnell Sieving, M.D.   Dg Chest 2 View  06/29/2011  *RADIOLOGY REPORT*  Clinical Data: Shortness of breath.  Status post hip surgery.  CHEST - 2 VIEW  Comparison:  06/26/2011.  Findings: Stable surgical changes from bypass surgery.  An atrial appendage closure device is noted.  The heart is borderline enlarged but stable. Bibasilar atelectasis is again demonstrated. No pleural effusion.  IMPRESSION: Persistent bibasilar atelectasis.  Original Report Authenticated By: P. Loralie Champagne, M.D.     Medications: Scheduled:    . atorvastatin  10 mg Oral QHS  . benazepril  10 mg Oral Daily  . diltiazem  240 mg Oral Daily  . Fluticasone-Salmeterol  1 puff Inhalation BID  . insulin aspart  0-9 Units Subcutaneous TID WC  . insulin aspart  3 Units Subcutaneous TID WC  . insulin glargine  15 Units Subcutaneous QHS  . loratadine  10 mg Oral Daily  . metoprolol  50 mg Oral BID  . potassium chloride SA  20 mEq Oral Daily  . sodium chloride  3 mL Intravenous Q12H  . Warfarin - Pharmacist Dosing Inpatient   Does not apply q1800  . DISCONTD: insulin glargine  10  Units Subcutaneous QHS  . DISCONTD: piperacillin-tazobactam (ZOSYN)  IV  3.375 g Intravenous Q8H  . DISCONTD: warfarin  5 mg Oral ONCE-1800   Continuous:    . lactated ringers 75 mL/hr (06/27/11 1156)     Assessment/Plan: Principal Problem:  *Closed right hip fracture Active Problems:  DIABETES MELLITUS, TYPE II  HYPERTENSION  PVD, Rt SFA PTA/HSRA 06/17/11  S/P aortic valve replacement: #23 Magna Ease Edwards Pericardial Valve    Leukocytosis  Persistent atrial fibrillation,    R Hip Fracture S/P - POD #3 ORIF = INTRAMEDULLARY (IM) NAIL FEMORAL (Right) per Dr Rennis Chris.  Hemodynamically stable but large Hematoma that is still enlarging and his Hbg has trended down again.  He is S/P 2 units PRBCs and will get 2 more.     PT/OT/CW consulted.  FL2 signed. Anticipate DC to SNF by Wednesday if all goes well.  Ortho ordered Meplex.  Coumadin being held.  At INR 1.5 FFP would not be useful.  Will give 2.5 mg Vit K - ? Usefulness?  History of aortic valve replacement-hemodynamically stable, no evidence of volume overload or congestive heart failure.  Pt with AFib/RVR on Cardizem 360 and Lopressor 50 BID.  HR better and he is Asxatic.    Atrial fibrillation -  rate controlled this am.    Currently off anticoagulation with Sig hematoma and blood loss anemia  On CT... continue beta blocker and calcium channel blocker   Tobacco abuse, no evidence of bronchospasm on physical exam, currently on Advair inhaler.  we'll watch the need for nebulizer treatment and pulmonary toilet,  Quit Tobacco encouraged.  Hypertension controlled.  Pneumonia- - Abx D/Ced with latest CXR (-)  Post op Blood Loss anemia. - Give 2 units more.  Hyperlipidemia on Zocor   Constipation - Senakot S added.  Fleets and Suppository also added.  Sorbitol also  Peripheral vascular disease S/P recent PTCI -  on Plavix, complicated by smoking, followed by Dr. Allyson Sabal  DM2 with complications- will continue sliding scale insulin,  last hemoglobin A1c 8.1% within the last one month, complicated by vascular disease and potential neuropathy.  Lantus added and will be increased.  3 units Novolog c meals.  Basename 06/29/11 2109 06/29/11 1712 06/29/11 1204  GLUCAP 272* 211* 274*   DVT Prophylaxis - squeezers.    LOS: 4 days   Ronald [name] M 06/30/2011, 7:34 AM

## 2011-06-30 NOTE — Progress Notes (Addendum)
Subjective: 3 Days Post-Op Procedure(s) (LRB): INTRAMEDULLARY (IM) NAIL FEMORAL (Right) Patient reports pain as mild.   Denies CP or SOB.  Positive flatus. Objective: Vital signs in last 24 hours: Temp:  [98.8 F (37.1 C)-99.6 F (37.6 C)] 99.6 F (37.6 C) (05/05 0416) Pulse Rate:  [73-80] 78  (05/05 0416) Resp:  [16-95] 17  (05/05 0416) BP: (128-143)/(51-66) 143/51 mmHg (05/05 0416) SpO2:  [18 %-96 %] 92 % (05/05 0416)  Intake/Output from previous day: 05/04 0701 - 05/05 0700 In: 1085 [P.O.:480; I.V.:605] Out: 500 [Urine:500] Intake/Output this shift:     Basename 06/30/11 0530 06/29/11 1223 06/29/11 0502 06/28/11 0508  HGB 7.7* 8.4* 8.4* 7.0*    Basename 06/30/11 0530 06/29/11 1223 06/29/11 0502  WBC 12.6* -- 13.3*  RBC 2.53* -- 2.78*  HCT 22.4* 24.0* --  PLT 101* -- 97*    Basename 06/30/11 0530  NA 134*  K 3.6  CL 99  CO2 26  BUN 17  CREATININE 1.01  GLUCOSE 224*  CALCIUM 8.4    Basename 06/30/11 0530 06/29/11 0502  LABPT -- --  INR 1.50* 1.63*    Neurologically intact Sensation intact distally Dorsiflexion/Plantar flexion intact Incision: dressing C/D/I Compartment soft Dressing  Changed for the first time with blistering from swelling   Assessment/Plan: 3 Days Post-Op Procedure(s) (LRB): INTRAMEDULLARY (IM) NAIL FEMORAL (Right) Daily dressing change with Meplex Monitor H/H down from yesterday per medicine will transfuse Per CT done yesterday has large hematoma will cont to monitor this result was discussed with Lestine Box Coumadin being held to hopefully prevent further bleeding  Lashonne Shull R. 06/30/2011, 7:22 AM

## 2011-07-01 ENCOUNTER — Encounter (HOSPITAL_COMMUNITY): Payer: Self-pay | Admitting: Orthopedic Surgery

## 2011-07-01 LAB — BASIC METABOLIC PANEL
BUN: 18 mg/dL (ref 6–23)
Calcium: 8.8 mg/dL (ref 8.4–10.5)
GFR calc non Af Amer: 72 mL/min — ABNORMAL LOW (ref >90)
Glucose, Bld: 151 mg/dL — ABNORMAL HIGH (ref 70–99)

## 2011-07-01 LAB — TYPE AND SCREEN
Unit division: 0
Unit division: 0

## 2011-07-01 LAB — CBC
HCT: 30.7 % — ABNORMAL LOW (ref 39.0–52.0)
Hemoglobin: 10.4 g/dL — ABNORMAL LOW (ref 13.0–17.0)
MCH: 29.6 pg (ref 26.0–34.0)
MCHC: 33.9 g/dL (ref 30.0–36.0)
MCV: 87.5 fL (ref 78.0–100.0)

## 2011-07-01 LAB — GLUCOSE, CAPILLARY: Glucose-Capillary: 172 mg/dL — ABNORMAL HIGH (ref 70–99)

## 2011-07-01 MED ORDER — BENAZEPRIL HCL 20 MG PO TABS
20.0000 mg | ORAL_TABLET | Freq: Every day | ORAL | Status: DC
  Administered 2011-07-01 – 2011-07-02 (×2): 20 mg via ORAL
  Filled 2011-07-01 (×2): qty 1

## 2011-07-01 MED ORDER — BIOTENE DRY MOUTH MT LIQD: 15.0000 mL | OROMUCOSAL | Status: DC | PRN

## 2011-07-01 NOTE — Progress Notes (Signed)
Physical Therapy Treatment Patient Details Name: Ronald [name] MRN: [id number] DOB: 05-Aug-1922 Today's Date: 07/01/2011 Time: 8413-2440 PT Time Calculation (min): 24 min  PT Assessment / Plan / Recommendation Comments on Treatment Session  Progressing with mobility and activity tolerance. Possible d/c to rehab on tomorrow.     Follow Up Recommendations  Skilled nursing facility    Equipment Recommendations  Defer to next venue    Frequency Min 5X/week   Plan Discharge plan remains appropriate    Precautions / Restrictions Precautions Precautions: Fall Restrictions Weight Bearing Restrictions: No RLE Weight Bearing: Weight bearing as tolerated   Pertinent Vitals/Pain     Mobility  Bed Mobility Bed Mobility: Supine to Sit Supine to Sit: 1: +2 Total assist Supine to Sit: Patient Percentage: 30% Details for Bed Mobility Assistance: Heavy assist for trunk to upright. Pt focused on moving R LE mostly himself with use of sheet. Assist for R LE off EOB. Utilized bedpad to assist with scooting, positoning.  Transfers Transfers: Sit to Stand;Stand to Sit Sit to Stand: 1: +2 Total assist;From elevated surface;From bed;With upper extremity assist Sit to Stand: Patient Percentage: 70% Stand to Sit: 3: Mod assist;With armrests;With upper extremity assist;To chair/3-in-1 Details for Transfer Assistance: VCs safety, technique, hand placement. Pt attempting to pull up on RW. Assist to rise, stabilze, control descent.  Ambulation/Gait Ambulation/Gait Assistance: 1: +2 Total assist Ambulation/Gait: Patient Percentage: 80 Ambulation Distance (Feet): 15 Feet Assistive device: Rolling walker Ambulation/Gait Assistance Details: VCs safety, technique, sequence. Increased hip/knee flexion for pt to advance R LE forward-no external physical assistance given to R LE. Assist to stabilize and navigate with RW. Slow gait speed. Fatigues easily. Dyspnea 3/4.  Gait Pattern: Step-to pattern;Antalgic      Exercises General Exercises - Lower Extremity Ankle Circles/Pumps: AROM;Both;Seated (12 reps) Quad Sets: AROM;Right;Seated Short Arc Quad: AROM;Right;Seated Heel Slides: AAROM;Right;Seated Hip ABduction/ADduction: AAROM;Right;Seated   PT Goals Acute Rehab PT Goals PT Goal: Supine/Side to Sit - Progress: Progressing toward goal PT Goal: Sit to Stand - Progress: Progressing toward goal PT Goal: Stand to Sit - Progress: Progressing toward goal PT Goal: Ambulate - Progress: Progressing toward goal  Visit Information  Last PT Received On: 07/01/11 Assistance Needed: +2    Subjective Data  Subjective: "Alright let me move the right leg" Patient Stated Goal: Rehab   Cognition  Overall Cognitive Status: Appears within functional limits for tasks assessed/performed Arousal/Alertness: Awake/alert Orientation Level: Appears intact for tasks assessed Behavior During Session: [facility] for tasks performed    Balance     End of Session PT - End of Session Equipment Utilized During Treatment: Gait belt Activity Tolerance: Patient limited by fatigue Patient left: in chair;with call bell/phone within reach    [name] 07/01/2011, 4:24 PM [phone]

## 2011-07-01 NOTE — Progress Notes (Signed)
Ronald [name]  MRN: [id number] DOB/Age: 1922-09-28 78 y.o. Physician: Lynnea Maizes, M.D. 4 Days Post-Op Procedure(s) (LRB): INTRAMEDULLARY (IM) NAIL FEMORAL (Right)  Subjective: Reports minimal hip pain, but c/o weakness right leg. Good appetite, denies SOB Vital Signs Temp:  [97.9 F (36.6 C)-100.2 F (37.9 C)] 97.9 F (36.6 C) (05/06 0610) Pulse Rate:  [80-94] 94  (05/06 0610) Resp:  [18-95] 18  (05/06 0610) BP: (161-180)/(63-80) 165/63 mmHg (05/06 0610) SpO2:  [18 %-95 %] 94 % (05/06 1009)  Lab Results  Basename 07/01/11 0433 06/30/11 0530  WBC 14.8* 12.6*  HGB 10.4* 7.7*  HCT 30.7* 22.4*  PLT 128* 101*   BMET  Basename 07/01/11 0433 06/30/11 0530  NA 137 134*  K 3.3* 3.6  CL 100 99  CO2 27 26  GLUCOSE 151* 224*  BUN 18 17  CREATININE 0.96 1.01  CALCIUM 8.8 8.4   INR  Date Value Range Status  07/01/2011 1.31  0.00-1.49 (no units) Final     Exam  Right thigh with diffuse swelling but no fluctuance and compartments soft. Dried blood on dressings, no active bleeding. Poor muscle tone but N/V intact. Assisted with SLR. CT shows post op hematoma as anticipated given anticoagulation, but I do not see any conditions for which surgical intervention would be indicated.  Plan continue to mobilize with PT, Monitor H and H , WBC and if stable/improving agree with plan for D/C to SNF tomorrow. F/U my office 2-3 weeks from surgery [name] 07/01/2011, 1:04 PM

## 2011-07-01 NOTE — Progress Notes (Signed)
[facility]  DAILY PROGRESS NOTE   Subjective:  HCT now back to 30 with tranfusion. A-fib remains rate controlled, however, he is hypertensive.  Objective:  Temp:  [97.9 F (36.6 C)-100.2 F (37.9 C)] 97.9 F (36.6 C) (05/06 0610) Pulse Rate:  [80-94] 94  (05/06 0610) Resp:  [18-95] 18  (05/06 0610) BP: (161-177)/(63-80) 165/63 mmHg (05/06 0610) SpO2:  [18 %-95 %] 94 % (05/06 1009) Weight change:   Intake/Output from previous day: 05/05 0701 - 05/06 0700 In: 1685 [P.O.:960; Blood:725] Out: 1602 [Urine:1600; Stool:2]  Intake/Output from this shift: Total I/O In: -  Out: 125 [Urine:125]  Medications: Current Facility-Administered Medications  Medication Dose Route Frequency Provider Last Rate Last Dose  . acetaminophen (TYLENOL) tablet 650 mg  650 mg Oral Q6H PRN Ralene Bathe, PA       Or  . acetaminophen (TYLENOL) suppository 650 mg  650 mg Rectal Q6H PRN Ralene Bathe, PA      . antiseptic oral rinse (BIOTENE) solution 15 mL  15 mL Mouth Rinse PRN [name], MD      . atorvastatin (LIPITOR) tablet 10 mg  10 mg Oral QHS [name], MD   10 mg at 06/30/11 2110  . benazepril (LOTENSIN) tablet 20 mg  20 mg Oral Daily [name], MD   20 mg at 07/01/11 0905  . bisacodyl (DULCOLAX) suppository 10 mg  10 mg Rectal Daily PRN [name], MD      . bisacodyl (DULCOLAX) suppository 10 mg  10 mg Rectal Daily PRN [name], MD   10 mg at 06/30/11 1719  . diazepam (VALIUM) injection 5 mg  5 mg Intravenous Q6H PRN Ralene Bathe, PA      . diltiazem (DILACOR XR) 24 hr capsule 240 mg  240 mg Oral Daily Ravisankar R Avva, MD   240 mg at 07/01/11 0905  . Fluticasone-Salmeterol (ADVAIR) 250-50 MCG/DOSE inhaler 1 puff  1 puff Inhalation BID Ravisankar R Avva, MD   1 puff at 07/01/11 1009  . HYDROcodone-acetaminophen (NORCO) 5-325 MG per tablet 1-2 tablet  1-2 tablet Oral Q4H PRN Ralene Bathe, PA   1 tablet at 06/30/11 0129  . HYDROmorphone (DILAUDID)  injection 0.5 mg  0.5 mg Intravenous Q2H PRN Ralene Bathe, PA   0.5 mg at 06/30/11 4098  . insulin aspart (novoLOG) injection 0-9 Units  0-9 Units Subcutaneous TID WC Ravisankar R Avva, MD   2 Units at 07/01/11 1258  . insulin aspart (novoLOG) injection 3 Units  3 Units Subcutaneous TID WC [name], MD   3 Units at 07/01/11 1257  . insulin glargine (LANTUS) injection 18 Units  18 Units Subcutaneous QHS [name], MD   18 Units at 06/30/11 2111  . lactated ringers infusion   Intravenous Continuous [name], MD 75 mL/hr at 06/27/11 1156 75 mL/hr at 06/27/11 1156  . loratadine (CLARITIN) tablet 10 mg  10 mg Oral Daily Ravisankar R Avva, MD   10 mg at 07/01/11 0905  . menthol-cetylpyridinium (CEPACOL) lozenge 3 mg  1 lozenge Oral PRN Ralene Bathe, PA       Or  . phenol (CHLORASEPTIC) mouth spray 1 spray  1 spray Mouth/Throat PRN Ralene Bathe, PA      . metoCLOPramide (REGLAN) tablet 5-10 mg  5-10 mg Oral Q8H PRN Ralene Bathe, PA       Or  . metoCLOPramide (REGLAN) injection 5-10 mg  5-10 mg Intravenous  Q8H PRN Ralene Bathe, PA      . metoprolol (LOPRESSOR) tablet 50 mg  50 mg Oral BID Ravisankar R Avva, MD   50 mg at 07/01/11 0905  . ondansetron (ZOFRAN) tablet 4 mg  4 mg Oral Q6H PRN Ralene Bathe, PA       Or  . ondansetron (ZOFRAN) injection 4 mg  4 mg Intravenous Q6H PRN Ralene Bathe, PA      . oxyCODONE-acetaminophen (PERCOCET) 5-325 MG per tablet 1-2 tablet  1-2 tablet Oral Q4H PRN Ralene Bathe, PA      . polyethylene glycol (MIRALAX / GLYCOLAX) packet 17 g  17 g Oral Daily PRN Ravisankar R Avva, MD      . potassium chloride SA (K-DUR,KLOR-CON) CR tablet 20 mEq  20 mEq Oral Daily Ravisankar R Avva, MD   20 mEq at 07/01/11 0905  . sodium chloride 0.9 % injection 3 mL  3 mL Intravenous Q12H Ravisankar R Avva, MD   3 mL at 06/30/11 2112  . sodium phosphate (FLEET) 7-19 GM/118ML enema 1 enema  1 enema Rectal Daily PRN [name], MD   1 enema at 06/30/11 5284  . sorbitol 70 %  solution 30 mL  30 mL Oral Daily PRN [name], MD   30 mL at 06/30/11 1032  . DISCONTD: benazepril (LOTENSIN) tablet 10 mg  10 mg Oral Daily [name], MD   10 mg at 06/30/11 1032    Physical Exam: General appearance: alert and no distress Neck: no adenopathy, no carotid bruit, no JVD, supple, symmetrical, trachea midline and thyroid not enlarged, symmetric, no tenderness/mass/nodules Lungs: clear to auscultation bilaterally Heart: irregularly irregular rhythm Abdomen: soft, non-tender; bowel sounds normal; no masses,  no organomegaly Extremities: right leg enlargement with eccyhmosis  Lab Results: Results for orders placed during the hospital encounter of 06/26/11 (from the past 48 hour(s))  GLUCOSE, CAPILLARY     Status: Abnormal   Collection Time   06/29/11  5:12 PM      Component Value Range Comment   Glucose-Capillary 211 (*) 70 - 99 (mg/dL)   GLUCOSE, CAPILLARY     Status: Abnormal   Collection Time   06/29/11  9:09 PM      Component Value Range Comment   Glucose-Capillary 272 (*) 70 - 99 (mg/dL)   PROTIME-INR     Status: Abnormal   Collection Time   06/30/11  5:30 AM      Component Value Range Comment   Prothrombin Time 18.4 (*) 11.6 - 15.2 (seconds)    INR 1.50 (*) 0.00 - 1.49    CBC     Status: Abnormal   Collection Time   06/30/11  5:30 AM      Component Value Range Comment   WBC 12.6 (*) 4.0 - 10.5 (K/uL)    RBC 2.53 (*) 4.22 - 5.81 (MIL/uL)    Hemoglobin 7.7 (*) 13.0 - 17.0 (g/dL)    HCT 13.2 (*) 44.0 - 52.0 (%)    MCV 88.5  78.0 - 100.0 (fL)    MCH 30.4  26.0 - 34.0 (pg)    MCHC 34.4  30.0 - 36.0 (g/dL)    RDW 10.2 (*) 72.5 - 15.5 (%)    Platelets 101 (*) 150 - 400 (K/uL) CONSISTENT WITH PREVIOUS RESULT  BASIC METABOLIC PANEL     Status: Abnormal   Collection Time   06/30/11  5:30 AM      Component Value Range Comment   Sodium  134 (*) 135 - 145 (mEq/L)    Potassium 3.6  3.5 - 5.1 (mEq/L)    Chloride 99  96 - 112 (mEq/L)    CO2 26  19 - 32 (mEq/L)     Glucose, Bld 224 (*) 70 - 99 (mg/dL)    BUN 17  6 - 23 (mg/dL)    Creatinine, Ser 1.61  0.50 - 1.35 (mg/dL)    Calcium 8.4  8.4 - 10.5 (mg/dL)    GFR calc non Af Amer 65 (*) >90 (mL/min)    GFR calc Af Amer 75 (*) >90 (mL/min)   GLUCOSE, CAPILLARY     Status: Abnormal   Collection Time   06/30/11  7:56 AM      Component Value Range Comment   Glucose-Capillary 198 (*) 70 - 99 (mg/dL)   PREPARE RBC (CROSSMATCH)     Status: Normal   Collection Time   06/30/11  8:00 AM      Component Value Range Comment   Order Confirmation ORDER PROCESSED BY BLOOD BANK     GLUCOSE, CAPILLARY     Status: Abnormal   Collection Time   06/30/11 11:44 AM      Component Value Range Comment   Glucose-Capillary 309 (*) 70 - 99 (mg/dL)   GLUCOSE, CAPILLARY     Status: Abnormal   Collection Time   06/30/11  4:48 PM      Component Value Range Comment   Glucose-Capillary 223 (*) 70 - 99 (mg/dL)   GLUCOSE, CAPILLARY     Status: Abnormal   Collection Time   06/30/11  9:10 PM      Component Value Range Comment   Glucose-Capillary 149 (*) 70 - 99 (mg/dL)   PROTIME-INR     Status: Abnormal   Collection Time   07/01/11  4:33 AM      Component Value Range Comment   Prothrombin Time 16.5 (*) 11.6 - 15.2 (seconds)    INR 1.31  0.00 - 1.49    CBC     Status: Abnormal   Collection Time   07/01/11  4:33 AM      Component Value Range Comment   WBC 14.8 (*) 4.0 - 10.5 (K/uL)    RBC 3.51 (*) 4.22 - 5.81 (MIL/uL)    Hemoglobin 10.4 (*) 13.0 - 17.0 (g/dL)    HCT 09.6 (*) 04.5 - 52.0 (%)    MCV 87.5  78.0 - 100.0 (fL)    MCH 29.6  26.0 - 34.0 (pg)    MCHC 33.9  30.0 - 36.0 (g/dL)    RDW 40.9 (*) 81.1 - 15.5 (%)    Platelets 128 (*) 150 - 400 (K/uL)   BASIC METABOLIC PANEL     Status: Abnormal   Collection Time   07/01/11  4:33 AM      Component Value Range Comment   Sodium 137  135 - 145 (mEq/L)    Potassium 3.3 (*) 3.5 - 5.1 (mEq/L)    Chloride 100  96 - 112 (mEq/L)    CO2 27  19 - 32 (mEq/L)    Glucose, Bld 151 (*) 70 -  99 (mg/dL)    BUN 18  6 - 23 (mg/dL)    Creatinine, Ser 9.14  0.50 - 1.35 (mg/dL)    Calcium 8.8  8.4 - 10.5 (mg/dL)    GFR calc non Af Amer 72 (*) >90 (mL/min)    GFR calc Af Amer 84 (*) >90 (mL/min)   GLUCOSE, CAPILLARY  Status: Abnormal   Collection Time   07/01/11  7:43 AM      Component Value Range Comment   Glucose-Capillary 172 (*) 70 - 99 (mg/dL)   GLUCOSE, CAPILLARY     Status: Abnormal   Collection Time   07/01/11 12:13 PM      Component Value Range Comment   Glucose-Capillary 176 (*) 70 - 99 (mg/dL)     Imaging: No results found.  Assessment:  1. Principal Problem: 2.  *Closed right hip fracture 3. Active Problems: 4.  DIABETES MELLITUS, TYPE II 5.  HYPERTENSION - uncontrolled 6.  PVD, Rt SFA PTA/HSRA 06/17/11 7.  S/P aortic valve replacement: #23 Magna Ease Edwards Pericardial Valve   8.  Leukocytosis 9.  Persistent atrial fibrillation,  10.   Plan:  1. A-fib seems controlled. Remains hypertensive, ACE-I increased. Consider switching to more potent ARB such as Benicar.  Hopefully bleeding issues have resolved. Will sign-off, follow-up with Dr. Allyson Sabal in our office.  Time Spent Directly with Patient:  15 minutes  Length of Stay:  LOS: 5 days   Chrystie Nose, MD, [facility] Attending Cardiologist [facility]  Santiaga Butzin C 07/01/2011, 2:00 PM

## 2011-07-01 NOTE — Progress Notes (Signed)
Subjective: 71 male with significant vasculopathy, AFib, recent AVR, DM2, COPD (still smoking) had a traumatic fall and R Hip fractutre. POD #4 ORIF per Dr Rennis Chris complicated by R Hip/Flank/Pelvic/Buttock Hematoma. No SOB, No CP.   Currently AFib HR 90-105 No other complaints.   S/P BM CBGs 149-309   Objective: Vital signs in last 24 hours: Temp:  [97.9 F (36.6 C)-100.2 F (37.9 C)] 97.9 F (36.6 C) (05/06 0610) Pulse Rate:  [80-101] 94  (05/06 0610) Resp:  [18-95] 18  (05/06 0610) BP: (146-180)/(63-80) 165/63 mmHg (05/06 0610) SpO2:  [18 %-95 %] 92 % (05/06 0610) Weight change:  Last BM Date: 06/30/11  CBG (last 3)   Basename 06/30/11 2110 06/30/11 1648 06/30/11 1144  GLUCAP 149* 223* 309*    Intake/Output from previous day:  Intake/Output Summary (Last 24 hours) at 07/01/11 0745 Last data filed at 07/01/11 1610  Gross per 24 hour  Intake   1685 ml  Output   1602 ml  Net     83 ml   05/05 0701 - 05/06 0700 In: 1685 [P.O.:960; Blood:725] Out: 1602 [Urine:1600; Stool:2]   Physical Exam  General appearance: A & O Resp: CTA Cardio: irreg, Sternotomy GI: soft, non-tender; bowel sounds normal; no masses,  no organomegaly Extremities: R Hip CDI.  Bruising and swollen - bruising extends into pelvis/buttocks and R flank.  No evidence of compartment syndrome. Some edema.    Lab Results:  Basename 07/01/11 0433 06/30/11 0530  NA 137 134*  K 3.3* 3.6  CL 100 99  CO2 27 26  GLUCOSE 151* 224*  BUN 18 17  CREATININE 0.96 1.01  CALCIUM 8.8 8.4  MG -- --  PHOS -- --    No results found for this basename: AST:2,ALT:2,ALKPHOS:2,BILITOT:2,PROT:2,ALBUMIN:2 in the last 72 hours   Basename 07/01/11 0433 06/30/11 0530  WBC 14.8* 12.6*  NEUTROABS -- --  HGB 10.4* 7.7*  HCT 30.7* 22.4*  MCV 87.5 88.5  PLT 128* 101*    Lab Results  Component Value Date   INR 1.31 07/01/2011   INR 1.50* 06/30/2011   INR 1.63* 06/29/2011    No results found for this basename:  CKTOTAL:3,CKMB:3,CKMBINDEX:3,TROPONINI:3 in the last 72 hours  No results found for this basename: TSH,T4TOTAL,FREET3,T3FREE,THYROIDAB in the last 72 hours  No results found for this basename: VITAMINB12:2,FOLATE:2,FERRITIN:2,TIBC:2,IRON:2,RETICCTPCT:2 in the last 72 hours  Micro Results: Recent Results (from the past 240 hour(s))  CULTURE, BLOOD (ROUTINE X 2)     Status: Normal (Preliminary result)   Collection Time   06/26/11  7:42 PM      Component Value Range Status Comment   Specimen Description BLOOD RIGHT ARM   Final    Special Requests BOTTLES DRAWN AEROBIC AND ANAEROBIC 5CC   Final    Culture  Setup Time [id number]   Final    Culture     Final    Value:        BLOOD CULTURE RECEIVED NO GROWTH TO DATE CULTURE WILL BE HELD FOR 5 DAYS BEFORE ISSUING A FINAL NEGATIVE REPORT   Report Status PENDING   Incomplete   CULTURE, BLOOD (ROUTINE X 2)     Status: Normal (Preliminary result)   Collection Time   06/26/11  7:52 PM      Component Value Range Status Comment   Specimen Description BLOOD RIGHT HAND   Final    Special Requests BOTTLES DRAWN AEROBIC ONLY 4CC   Final    Culture  Setup Time [id number]  Final    Culture     Final    Value:        BLOOD CULTURE RECEIVED NO GROWTH TO DATE CULTURE WILL BE HELD FOR 5 DAYS BEFORE ISSUING A FINAL NEGATIVE REPORT   Report Status PENDING   Incomplete      Studies/Results: Ct Abdomen Pelvis Wo Contrast  06/29/2011  *RADIOLOGY REPORT*  Clinical Data: Anticoagulated patient with recent ORIF of a right hip fracture, now with increased edema and bruising extending upward into the pelvis.  Evaluate for retroperitoneal hematoma.  CT ABDOMEN AND PELVIS WITHOUT CONTRAST 06/29/2011:  Technique:  Multidetector CT imaging of the abdomen and pelvis was performed following the standard protocol without intravenous contrast.  Comparison: CT abdomen pelvis without and with contrast 05/10/2008 [facility].  Findings: Respiratory motion  blurred images of the upper abdomen, though there a study is diagnostic.  Asymmetric enlargement of the right gluteal muscles, the right pectineus and obturator externus muscles, muscles, and the proximal medial and lateral compartment muscles of the upper right thigh, consistent with intramuscular hemorrhage/hematoma.  A subcutaneous hematoma is present overlying the right lower pelvis at the surgical site, and gas is present within the soft tissues related to the recent surgery.  There is no evidence of right piriformis muscle hematoma, and streaky density in the presacral space may represent edema or hemorrhage; if this does represent hemorrhage, it is a very small amount of retroperitoneal hemorrhage.  Normal unenhanced appearance of the liver, spleen, right adrenal gland, and right kidney.  Mild pancreatic atrophy.  Approximate 2.6 cm simple cyst arising from the upper pole of the left kidney, unchanged; no significant abnormalities involving the left kidney. Mild enlargement the left adrenal gland, unchanged, without nodularity.  Gallbladder contracted as there is food within the stomach, but is normal by CT.  No biliary ductal dilation.  Aorto- iliofemoral distal artery atherosclerosis, with left renal artery and bilateral common iliac artery stents.  Stomach and small bowel normal in appearance.  Sigmoid colon diverticulosis without evidence of acute diverticulitis.  Moderate stool burden.  Normal appendix in the right upper pelvis.  No ascites.  Moderate median lobe prostate gland enlargement.  Gas within the urinary bladder, presumably related to recent instrumentation.  Normal seminal vesicles.  Bone window images demonstrate the comminuted intertrochanteric right femoral neck fracture with ORIF, generalized osteopenia, old compression fracture of T12, degenerative changes in the sacroiliac joints, and degenerative changes involving the lower thoracic and lumbar spine. Heart enlarged.  Moderate bilateral  pleural effusions and associated passive atelectasis in the lower lobes.  IMPRESSION:  1.  Moderate sized hemorrhage/hematoma involving the right gluteal muscles, right pectineus and obturator externus muscles, and the proximal medial and lateral muscle groups of the upper thigh. 2.  Edema versus a very small amount of retroperitoneal hemorrhage in the presacral space.  No large retroperitoneal hemorrhage is identified. 3.  Small subcutaneous hematoma underlying the surgical site laterally in the right lower pelvis. 5.  Stable mild left adrenal hyperplasia. 6.  Stable mild pancreatic atrophy. 7.  Sigmoid colon diverticulosis without evidence of acute diverticulitis. 8.  Stable moderate median lobe prostate gland enlargement.  Original Report Authenticated By: Arnell Sieving, M.D.   Dg Chest 2 View  06/29/2011  *RADIOLOGY REPORT*  Clinical Data: Shortness of breath.  Status post hip surgery.  CHEST - 2 VIEW  Comparison: 06/26/2011.  Findings: Stable surgical changes from bypass surgery.  An atrial appendage closure device is noted.  The heart  is borderline enlarged but stable. Bibasilar atelectasis is again demonstrated. No pleural effusion.  IMPRESSION: Persistent bibasilar atelectasis.  Original Report Authenticated By: P. Loralie Champagne, M.D.     Medications: Scheduled:    . atorvastatin  10 mg Oral QHS  . benazepril  10 mg Oral Daily  . diltiazem  240 mg Oral Daily  . Fluticasone-Salmeterol  1 puff Inhalation BID  . furosemide  20 mg Intravenous Once  . insulin aspart  0-9 Units Subcutaneous TID WC  . insulin aspart  3 Units Subcutaneous TID WC  . insulin glargine  18 Units Subcutaneous QHS  . loratadine  10 mg Oral Daily  . metoprolol  50 mg Oral BID  . phytonadione  2.5 mg Oral Once  . potassium chloride SA  20 mEq Oral Daily  . sodium chloride  3 mL Intravenous Q12H   Continuous:    . lactated ringers 75 mL/hr (06/27/11 1156)     Assessment/Plan: Principal Problem:  *Closed  right hip fracture Active Problems:  DIABETES MELLITUS, TYPE II  HYPERTENSION  PVD, Rt SFA PTA/HSRA 06/17/11  S/P aortic valve replacement: #23 Magna Ease Edwards Pericardial Valve    Leukocytosis  Persistent atrial fibrillation,    R Hip Fracture S/P - POD #4 ORIF = INTRAMEDULLARY (IM) NAIL FEMORAL (Right) per Dr Rennis Chris.  Hemodynamically stable but large Hematoma that is still enlarging and his Hbg trended down again yesterday.  He is S/P 2 units PRBCs x 2 with appropriate rise in Hbg this am.     PT/OT/CW working with him and he is looking into [facility] home.  FL2 signed.  Anticipate DC to SNF tomorrow if Hbg stable and nothing needs to be done with the hematoma.  Ortho ordered Meplex.  Coumadin being held.  INR 1.31 and S/P 2.5 mg Vit K - ? Usefulness?.  May restart Coumadin in @ 1-2 weeks.  History of Bioprosthetic aortic valve replacement-hemodynamically stable, no evidence of volume overload or congestive heart failure.  Pt with AFib/RVR on Cardizem 360 and Lopressor 50 BID.  HR better and he is Asxatic.    Atrial fibrillation -  Nearly rate controlled this am.    Currently off anticoagulation with Sig hematoma and blood loss anemia .. continue beta blocker and calcium channel blocker   Tobacco abuse, no evidence of bronchospasm on physical exam, currently on Advair inhaler.  we'll watch the need for nebulizer treatment and pulmonary toilet,  Quit Tobacco encouraged - he has not had a cigarette since admission.  Hypertension - up some and ACEi increase.  He is on K and K low today.  Increased ACEi should help.  Pneumonia- - Abx D/Ced with latest CXR (-)  Post op Blood Loss anemia. - Give 2 units more = total 4 due the the large hematoma - Hopefully it has tamponade itself.  Hyperlipidemia on Zocor   Constipation - finally had a BM yesterday.  He has aggressive Bowel prep on order.  Peripheral vascular disease S/P recent PTCA -  on Plavix (although held with the bleed-restart in 1  week), complicated by smoking, followed by Dr. Allyson Sabal  DM2 with complications- ISS, last hemoglobin A1c 8.1% within the last one month, complicated by vascular disease and potential neuropathy.  Lantus adjusted - finally some 100's.  3 units Novolog c meals.  Basename 06/30/11 2110 06/30/11 1648 06/30/11 1144  GLUCAP 149* 223* 309*   DVT Prophylaxis - squeezers.  ST - throat is dry - will give mouth rinse. PE  is benign.  Anticipate D/C to SNF tomorrow   LOS: 5 days   Tiki Tucciarone M 07/01/2011, 7:45 AM

## 2011-07-02 DIAGNOSIS — S72009A Fracture of unspecified part of neck of unspecified femur, initial encounter for closed fracture: Secondary | ICD-10-CM | POA: Diagnosis not present

## 2011-07-02 DIAGNOSIS — L270 Generalized skin eruption due to drugs and medicaments taken internally: Secondary | ICD-10-CM | POA: Diagnosis present

## 2011-07-02 DIAGNOSIS — M84459D Pathological fracture, hip, unspecified, subsequent encounter for fracture with routine healing: Secondary | ICD-10-CM | POA: Diagnosis not present

## 2011-07-02 DIAGNOSIS — J90 Pleural effusion, not elsewhere classified: Secondary | ICD-10-CM | POA: Diagnosis not present

## 2011-07-02 DIAGNOSIS — R0602 Shortness of breath: Secondary | ICD-10-CM | POA: Diagnosis not present

## 2011-07-02 DIAGNOSIS — J189 Pneumonia, unspecified organism: Secondary | ICD-10-CM | POA: Diagnosis not present

## 2011-07-02 DIAGNOSIS — E785 Hyperlipidemia, unspecified: Secondary | ICD-10-CM | POA: Diagnosis present

## 2011-07-02 DIAGNOSIS — I499 Cardiac arrhythmia, unspecified: Secondary | ICD-10-CM | POA: Diagnosis not present

## 2011-07-02 DIAGNOSIS — I129 Hypertensive chronic kidney disease with stage 1 through stage 4 chronic kidney disease, or unspecified chronic kidney disease: Secondary | ICD-10-CM | POA: Diagnosis present

## 2011-07-02 DIAGNOSIS — I798 Other disorders of arteries, arterioles and capillaries in diseases classified elsewhere: Secondary | ICD-10-CM | POA: Diagnosis not present

## 2011-07-02 DIAGNOSIS — I359 Nonrheumatic aortic valve disorder, unspecified: Secondary | ICD-10-CM | POA: Diagnosis not present

## 2011-07-02 DIAGNOSIS — Z85828 Personal history of other malignant neoplasm of skin: Secondary | ICD-10-CM | POA: Diagnosis not present

## 2011-07-02 DIAGNOSIS — Z8551 Personal history of malignant neoplasm of bladder: Secondary | ICD-10-CM | POA: Diagnosis not present

## 2011-07-02 DIAGNOSIS — N189 Chronic kidney disease, unspecified: Secondary | ICD-10-CM | POA: Diagnosis present

## 2011-07-02 DIAGNOSIS — D649 Anemia, unspecified: Secondary | ICD-10-CM | POA: Diagnosis not present

## 2011-07-02 DIAGNOSIS — D689 Coagulation defect, unspecified: Secondary | ICD-10-CM | POA: Diagnosis not present

## 2011-07-02 DIAGNOSIS — G2581 Restless legs syndrome: Secondary | ICD-10-CM | POA: Diagnosis present

## 2011-07-02 DIAGNOSIS — Z794 Long term (current) use of insulin: Secondary | ICD-10-CM | POA: Diagnosis not present

## 2011-07-02 DIAGNOSIS — R05 Cough: Secondary | ICD-10-CM | POA: Diagnosis not present

## 2011-07-02 DIAGNOSIS — J4489 Other specified chronic obstructive pulmonary disease: Secondary | ICD-10-CM | POA: Diagnosis not present

## 2011-07-02 DIAGNOSIS — I447 Left bundle-branch block, unspecified: Secondary | ICD-10-CM | POA: Diagnosis present

## 2011-07-02 DIAGNOSIS — E1349 Other specified diabetes mellitus with other diabetic neurological complication: Secondary | ICD-10-CM | POA: Diagnosis not present

## 2011-07-02 DIAGNOSIS — F17200 Nicotine dependence, unspecified, uncomplicated: Secondary | ICD-10-CM | POA: Diagnosis present

## 2011-07-02 DIAGNOSIS — I4891 Unspecified atrial fibrillation: Secondary | ICD-10-CM | POA: Diagnosis not present

## 2011-07-02 DIAGNOSIS — Z7902 Long term (current) use of antithrombotics/antiplatelets: Secondary | ICD-10-CM | POA: Diagnosis not present

## 2011-07-02 DIAGNOSIS — S72143A Displaced intertrochanteric fracture of unspecified femur, initial encounter for closed fracture: Secondary | ICD-10-CM | POA: Diagnosis not present

## 2011-07-02 DIAGNOSIS — Z96649 Presence of unspecified artificial hip joint: Secondary | ICD-10-CM | POA: Diagnosis not present

## 2011-07-02 DIAGNOSIS — N390 Urinary tract infection, site not specified: Secondary | ICD-10-CM | POA: Diagnosis not present

## 2011-07-02 DIAGNOSIS — M129 Arthropathy, unspecified: Secondary | ICD-10-CM | POA: Diagnosis present

## 2011-07-02 DIAGNOSIS — I10 Essential (primary) hypertension: Secondary | ICD-10-CM | POA: Diagnosis not present

## 2011-07-02 DIAGNOSIS — Z954 Presence of other heart-valve replacement: Secondary | ICD-10-CM | POA: Diagnosis not present

## 2011-07-02 DIAGNOSIS — Z7901 Long term (current) use of anticoagulants: Secondary | ICD-10-CM | POA: Diagnosis not present

## 2011-07-02 DIAGNOSIS — R059 Cough, unspecified: Secondary | ICD-10-CM | POA: Diagnosis not present

## 2011-07-02 DIAGNOSIS — J449 Chronic obstructive pulmonary disease, unspecified: Secondary | ICD-10-CM | POA: Diagnosis not present

## 2011-07-02 DIAGNOSIS — I739 Peripheral vascular disease, unspecified: Secondary | ICD-10-CM | POA: Diagnosis present

## 2011-07-02 DIAGNOSIS — M25559 Pain in unspecified hip: Secondary | ICD-10-CM | POA: Diagnosis not present

## 2011-07-02 DIAGNOSIS — F329 Major depressive disorder, single episode, unspecified: Secondary | ICD-10-CM | POA: Diagnosis present

## 2011-07-02 DIAGNOSIS — E1149 Type 2 diabetes mellitus with other diabetic neurological complication: Secondary | ICD-10-CM | POA: Diagnosis present

## 2011-07-02 DIAGNOSIS — Z79899 Other long term (current) drug therapy: Secondary | ICD-10-CM | POA: Diagnosis not present

## 2011-07-02 DIAGNOSIS — E1142 Type 2 diabetes mellitus with diabetic polyneuropathy: Secondary | ICD-10-CM | POA: Diagnosis present

## 2011-07-02 DIAGNOSIS — E1159 Type 2 diabetes mellitus with other circulatory complications: Secondary | ICD-10-CM | POA: Diagnosis not present

## 2011-07-02 DIAGNOSIS — D72829 Elevated white blood cell count, unspecified: Secondary | ICD-10-CM | POA: Diagnosis not present

## 2011-07-02 DIAGNOSIS — M810 Age-related osteoporosis without current pathological fracture: Secondary | ICD-10-CM | POA: Diagnosis present

## 2011-07-02 DIAGNOSIS — Z471 Aftercare following joint replacement surgery: Secondary | ICD-10-CM | POA: Diagnosis not present

## 2011-07-02 LAB — BASIC METABOLIC PANEL
BUN: 21 mg/dL (ref 6–23)
CO2: 26 mEq/L (ref 19–32)
Chloride: 99 mEq/L (ref 96–112)
Creatinine, Ser: 0.97 mg/dL (ref 0.50–1.35)
GFR calc Af Amer: 84 mL/min — ABNORMAL LOW (ref >90)

## 2011-07-02 LAB — CBC
HCT: 31 % — ABNORMAL LOW (ref 39.0–52.0)
MCH: 30.2 pg (ref 26.0–34.0)
MCV: 89.1 fL (ref 78.0–100.0)
RDW: 15.8 % — ABNORMAL HIGH (ref 11.5–15.5)
WBC: 11.7 10*3/uL — ABNORMAL HIGH (ref 4.0–10.5)

## 2011-07-02 LAB — GLUCOSE, CAPILLARY: Glucose-Capillary: 183 mg/dL — ABNORMAL HIGH (ref 70–99)

## 2011-07-02 MED ORDER — WARFARIN SODIUM 5 MG PO TABS: ORAL_TABLET | ORAL | Status: DC

## 2011-07-02 MED ORDER — INSULIN GLARGINE 100 UNIT/ML ~~LOC~~ SOLN: 18.0000 [IU] | Freq: Every day | SUBCUTANEOUS | Status: DC

## 2011-07-02 MED ORDER — ACETAMINOPHEN 325 MG PO TABS: 650.0000 mg | ORAL_TABLET | Freq: Four times a day (QID) | ORAL | Status: DC | PRN

## 2011-07-02 MED ORDER — BENAZEPRIL HCL 20 MG PO TABS: 20.0000 mg | ORAL_TABLET | Freq: Every day | ORAL | Status: DC

## 2011-07-02 MED ORDER — BIOTENE DRY MOUTH MT LIQD: 15.0000 mL | OROMUCOSAL | Status: DC | PRN

## 2011-07-02 MED ORDER — CLOPIDOGREL BISULFATE 75 MG PO TABS: ORAL_TABLET | ORAL | Status: DC

## 2011-07-02 MED ORDER — HYDROCODONE-ACETAMINOPHEN 5-325 MG PO TABS: 1.0000 | ORAL_TABLET | ORAL | Status: AC | PRN

## 2011-07-02 MED ORDER — INSULIN ASPART 100 UNIT/ML ~~LOC~~ SOLN: SUBCUTANEOUS | Status: DC

## 2011-07-02 MED ORDER — POLYETHYLENE GLYCOL 3350 17 G PO PACK: 17.0000 g | PACK | Freq: Every day | ORAL | Status: AC | PRN

## 2011-07-02 NOTE — Progress Notes (Signed)
Patient set to discharge to [facility] today. Patient & daughter, Mach (cell#: [phone]) made aware. [facility] called for transport.   Clinical Social Work Department CLINICAL SOCIAL WORK PLACEMENT NOTE 07/02/2011  Patient:  Ronald [name], Ronald [name]  Account Number:  [id number] Admit date:  06/26/2011  Clinical Social Worker:  Orpah Greek  Date/time:  06/28/2011 10:45 AM  Clinical Social Work is seeking post-discharge placement for this patient at the following level of care:   SKILLED NURSING   (*CSW will update this form in Epic as items are completed)   06/28/2011  Patient/family provided with [facility] Department of Clinical Social Work's list of facilities offering this level of care within the geographic area requested by the patient (or if unable, by the patient's family).  06/28/2011  Patient/family informed of their freedom to choose among providers that offer the needed level of care, that participate in Medicare, Medicaid or managed care program needed by the patient, have an available bed and are willing to accept the patient.  06/28/2011  Patient/family informed of [facility]' ownership interest in [facility], as well as of the fact that they are under no obligation to receive care at this facility.  PASARR submitted to EDS on 06/27/2011 PASARR number received from EDS on 06/28/2011  FL2 transmitted to all facilities in geographic area requested by pt/family on  06/28/2011 FL2 transmitted to all facilities within larger geographic area on   Patient informed that his/her managed care company has contracts with or will negotiate with  certain facilities, including the following:     Patient/family informed of bed offers received:  07/02/2011 Patient chooses bed at [facility] Physician recommends and patient chooses bed at    Patient to be transferred to [facility] on  07/02/2011 Patient to be  transferred to facility by [facility]  The following physician request were entered in Epic:   Additional Comments:    Unice Bailey, LCSWA [phone]

## 2011-07-02 NOTE — Discharge Summary (Signed)
Physician Discharge Summary  DISCHARGE SUMMARY   [name] MR#: [id number] DOB/AGE: 1922/10/31 78 y.o.   Attending Physician:Meriel Kelliher M  Patient's ION:GEXBM,WUXL M, MD, MD  Consults:Treatment Team:  Senaida Lange, MD**  Admit date: 06/26/2011 Discharge date: 07/02/2011  Discharge Diagnoses:  Principal Problem:  *Closed right hip fracture Active Problems:  DIABETES MELLITUS, TYPE II  HYPERTENSION  PVD, Rt SFA PTA/HSRA 06/17/11  S/P aortic valve replacement: #23 Magna Ease Edwards Pericardial Valve    Leukocytosis  Persistent atrial fibrillation,    Patient Active Problem List  Diagnoses  . DIABETES MELLITUS, TYPE II  . HYPERLIPIDEMIA  . DEPRESSION  . RESTLESS LEGS SYNDROME  . HYPERTENSION  . PVD, Rt SFA PTA/HSRA 06/17/11  . DIVERTICULOSIS, COLON  . RENAL CYST  . ARTHRITIS  . MURMUR  . CAROTID BRUIT  . SKIN CANCER, HX OF  . RHEUMATIC HEART DISEASE, HX OF  . AS (aortic stenosis)  . S/P aortic valve replacement: #23 Magna Ease Edwards Pericardial Valve    . Thrombocytopenia  . Leukocytosis  . Claudication in peripheral vascular disease  . Aspirin allergy, rash, SOB. Pt is on Plavix  . Groin hematoma, Lt, level1 after PTA 06/17/11  . Chronic anticoagulation with Coumadin for CAF  . Persistent atrial fibrillation,   . Closed right hip fracture   Past Medical History  Diagnosis Date  . AS (aortic stenosis)   . DM2 (diabetes mellitus, type 2)   . HTN (hypertension)   . Heart murmur   . Thrombocytopenia due to drugs     seen by [name] plts 114000 no rx  . Cancer     Bladder Cancer local  . Peripheral vascular disease very poor circulation legs and feet ... stents right and left legs... done in [name] 's office.   . Depression wife died 4 years ago.    Marland Kitchen COPD (chronic obstructive pulmonary disease)   . Dysrhythmia 5-7 weeks ago ... sees dr Doroteo Glassman fibrilation  . Shortness of breath   . Recurrent upper respiratory infection  (URI)     sinusitis  . Neuropathy due to secondary diabetes   . Chronic kidney disease     renal artery stent  . Claudication in peripheral vascular disease 06/17/2011  . S/P angioplasty with stent, diamond back rotational athrectomy Prox. Rt. SFA 06/17/2011 06/17/2011    Discharged Condition: good   Discharge Medications: Medication List  As of 07/02/2011  8:03 AM   TAKE these medications         acetaminophen 325 MG tablet   Commonly known as: TYLENOL   Take 2 tablets (650 mg total) by mouth every 6 (six) hours as needed (or Fever >/= 101).      antiseptic oral rinse Liqd   15 mLs by Mouth Rinse route as needed.      benazepril 20 MG tablet   Commonly known as: LOTENSIN   Take 1 tablet (20 mg total) by mouth daily.      clopidogrel 75 MG tablet   Commonly known as: PLAVIX   ONE PO Daily - restart in 1-2 weeks - when safe      diltiazem 240 MG 24 hr capsule   Commonly known as: DILACOR XR   Take 240 mg by mouth daily.      doxazosin 4 MG tablet   Commonly known as: CARDURA   Take 4 mg by mouth at bedtime.      finasteride 5  MG tablet   Commonly known as: PROSCAR   Take 5 mg by mouth daily.      Fluticasone-Salmeterol 250-50 MCG/DOSE Aepb   Commonly known as: ADVAIR   Inhale 1 puff into the lungs 2 (two) times daily.      furosemide 20 MG tablet   Commonly known as: LASIX   Take 20 mg by mouth daily.      glyBURIDE 5 MG tablet   Commonly known as: DIABETA   Take 5-10 mg by mouth See admin instructions. 2 tablets in the morning and 1 tablet in the evening      HYDROcodone-acetaminophen 5-325 MG per tablet   Commonly known as: NORCO   Take 1-2 tablets by mouth every 4 (four) hours as needed.      insulin aspart 100 UNIT/ML injection   Commonly known as: novoLOG   Subcutaneous, 3 times daily with meals  Correction coverage: Sensitive (thin, NPO, renal)  CBG < 70: implement hypoglycemia protocol  CBG 70 - 120: 0 units  CBG 121 - 150: 1 unit  CBG 151 -  200: 2 units  CBG 201 - 250: 3 units  CBG 251 - 300: 5 units  CBG 301 - 350: 7 units  CBG 351 - 400: 9 units  CBG > 400: call MD      insulin glargine 100 UNIT/ML injection   Commonly known as: LANTUS   Inject 18 Units into the skin at bedtime.      loratadine 10 MG tablet   Commonly known as: CLARITIN   Take 1 tablet (10 mg total) by mouth daily.      metoprolol 50 MG tablet   Commonly known as: LOPRESSOR   Take 50 mg by mouth 2 (two) times daily.      polyethylene glycol packet   Commonly known as: MIRALAX / GLYCOLAX   Take 17 g by mouth daily as needed.      potassium chloride SA 20 MEQ tablet   Commonly known as: K-DUR,KLOR-CON   Take 20 mEq by mouth daily.      simvastatin 20 MG tablet   Commonly known as: ZOCOR   Take 20 mg by mouth at bedtime.      warfarin 5 MG tablet   Commonly known as: COUMADIN   2.5 mg PO daily to restart in 1-2 weeks wne safe to target INR 2-3            Hospital Procedures: Ct Abdomen Pelvis Wo Contrast  06/29/2011  *RADIOLOGY REPORT*  Clinical Data: Anticoagulated patient with recent ORIF of a right hip fracture, now with increased edema and bruising extending upward into the pelvis.  Evaluate for retroperitoneal hematoma.  CT ABDOMEN AND PELVIS WITHOUT CONTRAST 06/29/2011:  Technique:  Multidetector CT imaging of the abdomen and pelvis was performed following the standard protocol without intravenous contrast.  Comparison: CT abdomen pelvis without and with contrast 05/10/2008 [facility].  Findings: Respiratory motion blurred images of the upper abdomen, though there a study is diagnostic.  Asymmetric enlargement of the right gluteal muscles, the right pectineus and obturator externus muscles, muscles, and the proximal medial and lateral compartment muscles of the upper right thigh, consistent with intramuscular hemorrhage/hematoma.  A subcutaneous hematoma is present overlying the right lower pelvis at the surgical  site, and gas is present within the soft tissues related to the recent surgery.  There is no evidence of right piriformis muscle hematoma, and streaky density in the presacral space may represent  edema or hemorrhage; if this does represent hemorrhage, it is a very small amount of retroperitoneal hemorrhage.  Normal unenhanced appearance of the liver, spleen, right adrenal gland, and right kidney.  Mild pancreatic atrophy.  Approximate 2.6 cm simple cyst arising from the upper pole of the left kidney, unchanged; no significant abnormalities involving the left kidney. Mild enlargement the left adrenal gland, unchanged, without nodularity.  Gallbladder contracted as there is food within the stomach, but is normal by CT.  No biliary ductal dilation.  Aorto- iliofemoral distal artery atherosclerosis, with left renal artery and bilateral common iliac artery stents.  Stomach and small bowel normal in appearance.  Sigmoid colon diverticulosis without evidence of acute diverticulitis.  Moderate stool burden.  Normal appendix in the right upper pelvis.  No ascites.  Moderate median lobe prostate gland enlargement.  Gas within the urinary bladder, presumably related to recent instrumentation.  Normal seminal vesicles.  Bone window images demonstrate the comminuted intertrochanteric right femoral neck fracture with ORIF, generalized osteopenia, old compression fracture of T12, degenerative changes in the sacroiliac joints, and degenerative changes involving the lower thoracic and lumbar spine. Heart enlarged.  Moderate bilateral pleural effusions and associated passive atelectasis in the lower lobes.  IMPRESSION:  1.  Moderate sized hemorrhage/hematoma involving the right gluteal muscles, right pectineus and obturator externus muscles, and the proximal medial and lateral muscle groups of the upper thigh. 2.  Edema versus a very small amount of retroperitoneal hemorrhage in the presacral space.  No large retroperitoneal  hemorrhage is identified. 3.  Small subcutaneous hematoma underlying the surgical site laterally in the right lower pelvis. 5.  Stable mild left adrenal hyperplasia. 6.  Stable mild pancreatic atrophy. 7.  Sigmoid colon diverticulosis without evidence of acute diverticulitis. 8.  Stable moderate median lobe prostate gland enlargement.  Original Report Authenticated By: Arnell Sieving, M.D.   Dg Chest 1 View  06/26/2011  *RADIOLOGY REPORT*  Clinical Data:  Fall, hip pain  CHEST - 1 VIEW  Comparison: Chest radiograph 12/06/ 2012  Findings: Sternotomy wires overlie stable cardiac silhouette. There is mild air space disease in the right lower lobe.  No pleural fluid.  No pneumothorax.  No evidence of acute fracture. Remote fracture of a left posterior lateral rib.  IMPRESSION: Concern for right lobe pneumonia.  Original Report Authenticated By: Genevive Bi, M.D.   Dg Chest 2 View  06/29/2011  *RADIOLOGY REPORT*  Clinical Data: Shortness of breath.  Status post hip surgery.  CHEST - 2 VIEW  Comparison: 06/26/2011.  Findings: Stable surgical changes from bypass surgery.  An atrial appendage closure device is noted.  The heart is borderline enlarged but stable. Bibasilar atelectasis is again demonstrated. No pleural effusion.  IMPRESSION: Persistent bibasilar atelectasis.  Original Report Authenticated By: P. Loralie Champagne, M.D.   Dg Elbow Complete Right  06/26/2011  *RADIOLOGY REPORT*  Clinical Data: Injury from fall.  Pain.  Swelling.  RIGHT ELBOW - COMPLETE 3+ VIEW  Comparison: None.  Findings: No joint effusion is evident.  Alignment is normal.  No fracture or dislocation is evident.  IMPRESSION: No fracture or dislocation.  Original Report Authenticated By: Crawford Givens, M.D.   Dg Hip Complete Right  06/26/2011  *RADIOLOGY REPORT*  Clinical Data: Status post fall.  RIGHT HIP - COMPLETE 2+ VIEW  Comparison: None  Findings: Comminuted intertrochanteric fracture of the proximal right femur is identified.   No dislocation.  No significant angulation identified.  Mild impaction of the fracture fragments noted.  Metallic  stents are identified within the iliac vessels bilaterally.  IMPRESSION:  1. 1.  Comminuted, intertrochanteric fracture of the proximal right femur.  Original Report Authenticated By: Rosealee Albee, M.D.   Dg Hip Operative Right  06/27/2011  *RADIOLOGY REPORT*  Clinical Data: The fracture  OPERATIVE RIGHT HIP  Comparison: 06/26/2011  Findings: Dynamic compression screw and intramedullary rod are now in place transfixing an intertrochanteric right femur fracture. One distal interlocking screw.  Mild displacement of the lesser trochanter.  IMPRESSION: ORIF intertrochanteric femur fracture.  Original Report Authenticated By: Donavan Burnet, M.D.    History of Present Illness: 28- yo WM with multiple MMPs including recent bioprosthetic aortic valve replacement in November, 2012 for critical aortic stenosis complicated by prolonged URI, , with associated cardiac catheterization revealing normal coronary arteries and normal LV function.  He does have significant issues with peripheral vascular disease and indeed went under atherectomy in the right lower extremity within the last 2 weeks by Dr. Gery Pray of [facility] in heart and vascular [facility]. He also has ongoing COPD with tobacco abuse, treated hypertension, dyslipidemia treated with statin therapy, as well as type 2 diabetes managed by primary care physician, Dr. Timothy Lasso. He also has atrial fibrillation managed with anticoagulation with Coumadin.  He has participated with cardiac rehabilitation and is supported in conjunction by history children who live fairly locally. Recently had significant claudication type symptoms and underwent balloon atherectomy of the right lower extremity by cardiology. That took place approximately 9 days prior to admit. He was going to see cardiology for followup when he had a mechanical fall in the parking deck without  loss of consciousness resulting in right femur fracture. Presented to the [facility] emergency room with severe right hip pain with inability to bear weight and found to have intertrochanteric hip fx.    Hospital Course: 39 male with significant vasculopathy, AFib, recent AVR, DM2, COPD (still smoking) had a traumatic fall and R Hip fracture. He was seen by Ortho and Admitted by Dr Felipa Eth for his Right IT hip fx, in patient with cardiac and vascular disease on chronic Coumadin.  We held his coumadin and admission INR was 1.8 and he had not taken a dose that day.Ortho made tentative plans for ORIF R hip in AM if cleared by medicine. He was kept npo after midnight and placed in traction and given prn pain control meds for comfort.  He was cleared.  Plavix was held as well.  ?Pneumonia-? Seen on initial CXR although he was clinically asymptomatic - He was given Abx in the ED and continued.  On 06/27/11 Dr Rennis Chris brought him to the OR and performed an Open reduction and internal fixation of right intertrochanteric hip fracture, utilizing intramedullary hip screw.  He did well post op except for Afib with RVR that settled down over the ensuing days - Cards followed and meds did not really need adjusting.  His biggest issue was the massive R Hip Hematoma that developed dropping his Hbg 2x and requiring 2 units PRBCs twice.  He is now Hemodynamically stable and ready for D/C to SNF for rehab prior to returning home.  His Hbg has been stable for two days straight.  If Hbg stable for 1 week - He can be restarted on Plavix/Coumadin per the doctor at the [facility] discretion.  He was also severely constipated and required aggressive Bowel prep before a successful movement occurred.   No SOB, No CP.  Currently AFib HR 90-105  No other complaints.  Today is POD #5 ORIF = INTRAMEDULLARY (IM) NAIL FEMORAL (Right) per Dr Rennis Chris complicated by R Hip/Flank/Pelvic/Buttock Hematoma.  Hemodynamically stable but large Hematoma that has  finally stabilized. He is S/P 2 units PRBCs x 2 with appropriate rise  And maintenance of his Hbg. PT/OT/CW working with him and he is looking into [place] home. FL2 signed. Anticipate DC to SNF today. Ortho ordered Meplex. Coumadin being held.  S/P 2.5 mg Vit K - ? Usefulness?. May restart Coumadin/Plavix in @ 1-2 weeks.  Per Dr Rennis Chris yesterdays note - Right thigh with diffuse swelling but no fluctuance and compartments soft. Dried blood on dressings, no active bleeding. Poor muscle tone but N/V intact. Assisted with SLR.  CT shows post op hematoma as anticipated given anticoagulation, but I do not see any conditions for which surgical intervention would be indicated.  His plan was continue to mobilize with PT,  Monitor H and H , WBC and if stable/improving agree with plan for D/C to SNF tomorrow.  F/U my office 2-3 weeks from surgery  History of Bioprosthetic aortic valve replacement-hemodynamically stable, no evidence of volume overload or congestive heart failure. Pt with AFib/RVR on Cardizem 360 and Lopressor 50 BID. HR better and he is Asxatic.   Atrial fibrillation - Nearly rate controlled - he is ASxatic. Currently off anticoagulation with Sig hematoma and blood loss anemia .. continue beta blocker and calcium channel blocker HTN- Remains hypertensive, ACE-I increased yesterday. May go down more when all his meds are restarted.  Consider switching to more potent ARB such as Benicar. Cards recommended follow-up with Dr. Allyson Sabal in our office.   Tobacco abuse, no evidence of bronchospasm on physical exam, currently on Advair inhaler. we'll watch the need for nebulizer treatment and pulmonary toilet, Quit Tobacco encouraged - he has not had a cigarette in 7-8 days.    Pneumonia- - Abx D/Ced with latest CXR (-)   Post op Blood Loss anemia. - S/P a total of 4 PRBCs due the the large hematoma - Hopefully it has finally tamponaded itself.   Hyperlipidemia on Zocor   Peripheral vascular disease S/P  recent PTCA - on Plavix (although held with the bleed-restart in 1 week), complicated by smoking, followed by Dr. Allyson Sabal   DM2 with complications- ISS, last hemoglobin A1c 8.1% within the last one month, complicated by vascular disease and potential neuropathy. Lantus adjusted - finally some 100's. 3 units Novolog c meals.  Last 4 CBGs 175-183   DVT Prophylaxis - squeezers provided.   ST - throat is dry - S/P mouth rinse. PE is benign.   Osteoporosis - will get DEXA and be evaluated for meds as an outpt.  Anticipate D/C to SNF today        Day of Discharge Exam BP 160/69  Pulse 97  Temp(Src) 98.2 F (36.8 C) (Oral)  Resp 18  Ht 5\' 11"  (1.803 m)  Wt 81 kg (178 lb 9.2 oz)  BMI 24.91 kg/m2  SpO2 96%  Physical Exam: General appearance: A & O  Resp: CTA  Cardio: irreg, Sternotomy  GI: soft, non-tender; bowel sounds normal; no masses, no organomegaly  Extremities: R Hip CDI. Bruising and swollen - bruising extends into pelvis/buttocks and R flank. No evidence of compartment syndrome.  Some edema.   Discharge Labs:  [facility] 07/02/11 0434 07/01/11 0433  NA 134* 137  K 3.7 3.3*  CL 99 100  CO2 26 27  GLUCOSE 175* 151*  BUN 21 18  CREATININE 0.97 0.96  CALCIUM 8.8 8.8  MG -- --  PHOS -- --   No results found for this basename: AST:2,ALT:2,ALKPHOS:2,BILITOT:2,PROT:2,ALBUMIN:2 in the last 72 hours  Basename 07/02/11 0434 07/01/11 0433  WBC 11.7* 14.8*  NEUTROABS -- --  HGB 10.5* 10.4*  HCT 31.0* 30.7*  MCV 89.1 87.5  PLT 132* 128*   No results found for this basename: CKTOTAL:3,CKMB:3,CKMBINDEX:3,TROPONINI:3 in the last 72 hours No results found for this basename: TSH,T4TOTAL,FREET3,T3FREE,THYROIDAB in the last 72 hours No results found for this basename: VITAMINB12:2,FOLATE:2,FERRITIN:2,TIBC:2,IRON:2,RETICCTPCT:2 in the last 72 hours Lab Results  Component Value Date   INR 1.23 07/02/2011   INR 1.31 07/01/2011   INR 1.50* 06/30/2011       Discharge  instructions: Discharge Orders    Future Appointments: Provider: Department: Dept Phone: Center:   07/04/2011 11:00 AM Delight Ovens, MD [facility] [phone] [id number]     01-Home or Self Care Follow-up Information    Follow up with [name], MD on 07/26/2011. (11:30 AM for office visit.    Plan to reschedule lower ext. arterial dopplers after rehab.  Call our office when you are leaving rehab to arrange coumadin level appointment.)    Contact information:   [address] [place] [address] [phone]           Disposition:SNF  Follow-up Appts: Follow-up with Dr. Timothy Lasso at [facility] 2 weeks post D/C from SNF.  Call for appointment.  Condition on Discharge: stable  Tests Needing Follow-up: CBCs, PT/OT/Rehab  Time spent in discharge (includes decision making & examination of pt): 40 min  Signed: Jozi Malachi M 07/02/2011, 8:03 AM

## 2011-07-03 DIAGNOSIS — D649 Anemia, unspecified: Secondary | ICD-10-CM | POA: Diagnosis not present

## 2011-07-03 DIAGNOSIS — I4891 Unspecified atrial fibrillation: Secondary | ICD-10-CM | POA: Diagnosis not present

## 2011-07-03 DIAGNOSIS — I10 Essential (primary) hypertension: Secondary | ICD-10-CM | POA: Diagnosis not present

## 2011-07-03 LAB — CULTURE, BLOOD (ROUTINE X 2)
Culture  Setup Time: 201305020225
Culture: NO GROWTH

## 2011-07-04 ENCOUNTER — Ambulatory Visit: Payer: Medicare Other | Admitting: Cardiothoracic Surgery

## 2011-07-15 DIAGNOSIS — S72143A Displaced intertrochanteric fracture of unspecified femur, initial encounter for closed fracture: Secondary | ICD-10-CM | POA: Diagnosis not present

## 2011-07-23 DIAGNOSIS — D649 Anemia, unspecified: Secondary | ICD-10-CM | POA: Diagnosis not present

## 2011-07-23 DIAGNOSIS — D689 Coagulation defect, unspecified: Secondary | ICD-10-CM | POA: Diagnosis not present

## 2011-07-25 ENCOUNTER — Emergency Department (HOSPITAL_COMMUNITY): Payer: Medicare Other

## 2011-07-25 ENCOUNTER — Inpatient Hospital Stay (HOSPITAL_COMMUNITY)
Admission: EM | Admit: 2011-07-25 | Discharge: 2011-07-29 | DRG: 195 | Disposition: A | Discharge status: Skilled Nursing Facility | Payer: Medicare Other | Source: Ambulatory Visit | Attending: Internal Medicine | Admitting: Internal Medicine

## 2011-07-25 ENCOUNTER — Encounter (HOSPITAL_COMMUNITY): Payer: Self-pay

## 2011-07-25 ENCOUNTER — Other Ambulatory Visit: Payer: Self-pay

## 2011-07-25 DIAGNOSIS — G2581 Restless legs syndrome: Secondary | ICD-10-CM | POA: Diagnosis present

## 2011-07-25 DIAGNOSIS — E785 Hyperlipidemia, unspecified: Secondary | ICD-10-CM | POA: Diagnosis not present

## 2011-07-25 DIAGNOSIS — Z7901 Long term (current) use of anticoagulants: Secondary | ICD-10-CM | POA: Diagnosis not present

## 2011-07-25 DIAGNOSIS — Z8551 Personal history of malignant neoplasm of bladder: Secondary | ICD-10-CM | POA: Diagnosis not present

## 2011-07-25 DIAGNOSIS — I499 Cardiac arrhythmia, unspecified: Secondary | ICD-10-CM | POA: Diagnosis not present

## 2011-07-25 DIAGNOSIS — Z794 Long term (current) use of insulin: Secondary | ICD-10-CM | POA: Diagnosis not present

## 2011-07-25 DIAGNOSIS — J984 Other disorders of lung: Secondary | ICD-10-CM | POA: Diagnosis not present

## 2011-07-25 DIAGNOSIS — Z888 Allergy status to other drugs, medicaments and biological substances status: Secondary | ICD-10-CM | POA: Diagnosis present

## 2011-07-25 DIAGNOSIS — S72001A Fracture of unspecified part of neck of right femur, initial encounter for closed fracture: Secondary | ICD-10-CM | POA: Diagnosis present

## 2011-07-25 DIAGNOSIS — I739 Peripheral vascular disease, unspecified: Secondary | ICD-10-CM | POA: Diagnosis not present

## 2011-07-25 DIAGNOSIS — Z79899 Other long term (current) drug therapy: Secondary | ICD-10-CM

## 2011-07-25 DIAGNOSIS — Z954 Presence of other heart-valve replacement: Secondary | ICD-10-CM | POA: Diagnosis not present

## 2011-07-25 DIAGNOSIS — D649 Anemia, unspecified: Secondary | ICD-10-CM | POA: Diagnosis present

## 2011-07-25 DIAGNOSIS — R059 Cough, unspecified: Secondary | ICD-10-CM | POA: Diagnosis not present

## 2011-07-25 DIAGNOSIS — L270 Generalized skin eruption due to drugs and medicaments taken internally: Secondary | ICD-10-CM | POA: Diagnosis present

## 2011-07-25 DIAGNOSIS — I4891 Unspecified atrial fibrillation: Secondary | ICD-10-CM | POA: Diagnosis present

## 2011-07-25 DIAGNOSIS — I447 Left bundle-branch block, unspecified: Secondary | ICD-10-CM | POA: Diagnosis present

## 2011-07-25 DIAGNOSIS — M810 Age-related osteoporosis without current pathological fracture: Secondary | ICD-10-CM | POA: Diagnosis present

## 2011-07-25 DIAGNOSIS — R0602 Shortness of breath: Secondary | ICD-10-CM | POA: Diagnosis not present

## 2011-07-25 DIAGNOSIS — Z7902 Long term (current) use of antithrombotics/antiplatelets: Secondary | ICD-10-CM

## 2011-07-25 DIAGNOSIS — I10 Essential (primary) hypertension: Secondary | ICD-10-CM | POA: Diagnosis present

## 2011-07-25 DIAGNOSIS — F17200 Nicotine dependence, unspecified, uncomplicated: Secondary | ICD-10-CM | POA: Diagnosis present

## 2011-07-25 DIAGNOSIS — I129 Hypertensive chronic kidney disease with stage 1 through stage 4 chronic kidney disease, or unspecified chronic kidney disease: Secondary | ICD-10-CM | POA: Diagnosis present

## 2011-07-25 DIAGNOSIS — M129 Arthropathy, unspecified: Secondary | ICD-10-CM | POA: Diagnosis present

## 2011-07-25 DIAGNOSIS — Z5189 Encounter for other specified aftercare: Secondary | ICD-10-CM | POA: Diagnosis not present

## 2011-07-25 DIAGNOSIS — J189 Pneumonia, unspecified organism: Principal | ICD-10-CM

## 2011-07-25 DIAGNOSIS — Z952 Presence of prosthetic heart valve: Secondary | ICD-10-CM

## 2011-07-25 DIAGNOSIS — F3289 Other specified depressive episodes: Secondary | ICD-10-CM | POA: Diagnosis present

## 2011-07-25 DIAGNOSIS — E1149 Type 2 diabetes mellitus with other diabetic neurological complication: Secondary | ICD-10-CM | POA: Diagnosis present

## 2011-07-25 DIAGNOSIS — N189 Chronic kidney disease, unspecified: Secondary | ICD-10-CM | POA: Diagnosis present

## 2011-07-25 DIAGNOSIS — F329 Major depressive disorder, single episode, unspecified: Secondary | ICD-10-CM | POA: Diagnosis present

## 2011-07-25 DIAGNOSIS — J4489 Other specified chronic obstructive pulmonary disease: Secondary | ICD-10-CM | POA: Diagnosis present

## 2011-07-25 DIAGNOSIS — IMO0001 Reserved for inherently not codable concepts without codable children: Secondary | ICD-10-CM | POA: Diagnosis present

## 2011-07-25 DIAGNOSIS — Z85828 Personal history of other malignant neoplasm of skin: Secondary | ICD-10-CM

## 2011-07-25 DIAGNOSIS — E1142 Type 2 diabetes mellitus with diabetic polyneuropathy: Secondary | ICD-10-CM | POA: Diagnosis present

## 2011-07-25 DIAGNOSIS — E119 Type 2 diabetes mellitus without complications: Secondary | ICD-10-CM | POA: Diagnosis not present

## 2011-07-25 DIAGNOSIS — D72829 Elevated white blood cell count, unspecified: Secondary | ICD-10-CM | POA: Diagnosis present

## 2011-07-25 DIAGNOSIS — J449 Chronic obstructive pulmonary disease, unspecified: Secondary | ICD-10-CM | POA: Diagnosis present

## 2011-07-25 DIAGNOSIS — I482 Chronic atrial fibrillation, unspecified: Secondary | ICD-10-CM | POA: Diagnosis present

## 2011-07-25 DIAGNOSIS — T39095A Adverse effect of salicylates, initial encounter: Secondary | ICD-10-CM | POA: Diagnosis present

## 2011-07-25 DIAGNOSIS — I359 Nonrheumatic aortic valve disorder, unspecified: Secondary | ICD-10-CM | POA: Diagnosis not present

## 2011-07-25 DIAGNOSIS — E118 Type 2 diabetes mellitus with unspecified complications: Secondary | ICD-10-CM | POA: Diagnosis present

## 2011-07-25 DIAGNOSIS — J9600 Acute respiratory failure, unspecified whether with hypoxia or hypercapnia: Secondary | ICD-10-CM | POA: Diagnosis not present

## 2011-07-25 DIAGNOSIS — J90 Pleural effusion, not elsewhere classified: Secondary | ICD-10-CM | POA: Diagnosis not present

## 2011-07-25 DIAGNOSIS — R05 Cough: Secondary | ICD-10-CM | POA: Diagnosis not present

## 2011-07-25 HISTORY — DX: Pneumonia, unspecified organism: J18.9

## 2011-07-25 HISTORY — DX: Encounter for other specified aftercare: Z51.89

## 2011-07-25 HISTORY — DX: Personal history of peptic ulcer disease: Z87.11

## 2011-07-25 HISTORY — DX: Type 2 diabetes mellitus without complications: E11.9

## 2011-07-25 HISTORY — DX: Reserved for inherently not codable concepts without codable children: IMO0001

## 2011-07-25 HISTORY — DX: Personal history of other diseases of the digestive system: Z87.19

## 2011-07-25 HISTORY — DX: Malignant neoplasm of bladder, unspecified: C67.9

## 2011-07-25 LAB — TYPE AND SCREEN
ABO/RH(D): O POS
Antibody Screen: NEGATIVE

## 2011-07-25 LAB — DIFFERENTIAL
Basophils Absolute: 0 10*3/uL (ref 0.0–0.1)
Lymphocytes Relative: 8 % — ABNORMAL LOW (ref 12–46)
Monocytes Relative: 11 % (ref 3–12)
Neutro Abs: 15.1 10*3/uL — ABNORMAL HIGH (ref 1.7–7.7)

## 2011-07-25 LAB — URINALYSIS, ROUTINE W REFLEX MICROSCOPIC
Glucose, UA: NEGATIVE mg/dL
Leukocytes, UA: NEGATIVE
Specific Gravity, Urine: 1.018 (ref 1.005–1.030)
pH: 7 (ref 5.0–8.0)

## 2011-07-25 LAB — LACTIC ACID, PLASMA: Lactic Acid, Venous: 1 mmol/L (ref 0.5–2.2)

## 2011-07-25 LAB — CBC
HCT: 26.9 % — ABNORMAL LOW (ref 39.0–52.0)
Hemoglobin: 9 g/dL — ABNORMAL LOW (ref 13.0–17.0)
RBC: 2.99 MIL/uL — ABNORMAL LOW (ref 4.22–5.81)
RDW: 17.3 % — ABNORMAL HIGH (ref 11.5–15.5)
WBC: 19.1 10*3/uL — ABNORMAL HIGH (ref 4.0–10.5)

## 2011-07-25 LAB — COMPREHENSIVE METABOLIC PANEL
AST: 46 U/L — ABNORMAL HIGH (ref 0–37)
Albumin: 2.2 g/dL — ABNORMAL LOW (ref 3.5–5.2)
Alkaline Phosphatase: 143 U/L — ABNORMAL HIGH (ref 39–117)
BUN: 25 mg/dL — ABNORMAL HIGH (ref 6–23)
Chloride: 101 mEq/L (ref 96–112)
Potassium: 3.8 mEq/L (ref 3.5–5.1)
Total Bilirubin: 1.2 mg/dL (ref 0.3–1.2)

## 2011-07-25 LAB — GLUCOSE, CAPILLARY
Glucose-Capillary: 120 mg/dL — ABNORMAL HIGH (ref 70–99)
Glucose-Capillary: 246 mg/dL — ABNORMAL HIGH (ref 70–99)

## 2011-07-25 LAB — URINE MICROSCOPIC-ADD ON

## 2011-07-25 MED ORDER — PIPERACILLIN-TAZOBACTAM 3.375 G IVPB
3.3750 g | Freq: Three times a day (TID) | INTRAVENOUS | Status: DC
  Administered 2011-07-25 – 2011-07-29 (×11): 3.375 g via INTRAVENOUS
  Filled 2011-07-25 (×14): qty 50

## 2011-07-25 MED ORDER — LORATADINE 10 MG PO TABS
10.0000 mg | ORAL_TABLET | Freq: Every day | ORAL | Status: DC
  Administered 2011-07-26 – 2011-07-27 (×2): 10 mg via ORAL
  Filled 2011-07-25 (×3): qty 1

## 2011-07-25 MED ORDER — METHYLPREDNISOLONE SODIUM SUCC 125 MG IJ SOLR
60.0000 mg | Freq: Two times a day (BID) | INTRAMUSCULAR | Status: DC
  Administered 2011-07-25 – 2011-07-26 (×2): 60 mg via INTRAVENOUS
  Filled 2011-07-25: qty 0.96
  Filled 2011-07-25: qty 2
  Filled 2011-07-25: qty 0.96
  Filled 2011-07-25: qty 2
  Filled 2011-07-25: qty 0.96

## 2011-07-25 MED ORDER — PIPERACILLIN-TAZOBACTAM 3.375 G IVPB
3.3750 g | Freq: Once | INTRAVENOUS | Status: AC
  Administered 2011-07-25: 3.375 g via INTRAVENOUS
  Filled 2011-07-25: qty 50

## 2011-07-25 MED ORDER — INSULIN ASPART 100 UNIT/ML ~~LOC~~ SOLN
0.0000 [IU] | Freq: Three times a day (TID) | SUBCUTANEOUS | Status: DC
  Administered 2011-07-26 (×2): 8 [IU] via SUBCUTANEOUS
  Administered 2011-07-26: 11 [IU] via SUBCUTANEOUS
  Administered 2011-07-27 (×3): 8 [IU] via SUBCUTANEOUS
  Administered 2011-07-28: 5 [IU] via SUBCUTANEOUS
  Administered 2011-07-28 (×2): 8 [IU] via SUBCUTANEOUS
  Administered 2011-07-29: 11 [IU] via SUBCUTANEOUS
  Administered 2011-07-29: 2 [IU] via SUBCUTANEOUS

## 2011-07-25 MED ORDER — VANCOMYCIN HCL IN DEXTROSE 1-5 GM/200ML-% IV SOLN
1000.0000 mg | Freq: Two times a day (BID) | INTRAVENOUS | Status: DC
  Administered 2011-07-26 – 2011-07-28 (×6): 1000 mg via INTRAVENOUS
  Filled 2011-07-25 (×9): qty 200

## 2011-07-25 MED ORDER — VANCOMYCIN HCL 1000 MG IV SOLR
1500.0000 mg | Freq: Once | INTRAVENOUS | Status: AC
  Administered 2011-07-25: 1500 mg via INTRAVENOUS
  Filled 2011-07-25: qty 1500

## 2011-07-25 MED ORDER — SODIUM CHLORIDE 0.9 % IV SOLN
INTRAVENOUS | Status: DC
  Administered 2011-07-25 – 2011-07-27 (×2): via INTRAVENOUS

## 2011-07-25 MED ORDER — BENAZEPRIL HCL 20 MG PO TABS
20.0000 mg | ORAL_TABLET | Freq: Every day | ORAL | Status: DC
  Administered 2011-07-25 – 2011-07-29 (×5): 20 mg via ORAL
  Filled 2011-07-25 (×5): qty 1

## 2011-07-25 MED ORDER — IPRATROPIUM BROMIDE 0.02 % IN SOLN: 0.5000 mg | Freq: Four times a day (QID) | RESPIRATORY_TRACT | Status: DC

## 2011-07-25 MED ORDER — DEXTROSE 5 % IV SOLN
5.0000 mg/h | INTRAVENOUS | Status: DC
  Administered 2011-07-25 – 2011-07-26 (×2): 5 mg/h via INTRAVENOUS
  Filled 2011-07-25 (×2): qty 100

## 2011-07-25 MED ORDER — DILTIAZEM HCL ER COATED BEADS 240 MG PO CP24
240.0000 mg | ORAL_CAPSULE | ORAL | Status: AC
  Administered 2011-07-25: 240 mg via ORAL
  Filled 2011-07-25: qty 1

## 2011-07-25 MED ORDER — IPRATROPIUM BROMIDE 0.02 % IN SOLN
0.5000 mg | Freq: Four times a day (QID) | RESPIRATORY_TRACT | Status: DC
  Administered 2011-07-25 – 2011-07-29 (×15): 0.5 mg via RESPIRATORY_TRACT
  Filled 2011-07-25 (×15): qty 2.5

## 2011-07-25 MED ORDER — DOXAZOSIN MESYLATE 4 MG PO TABS
4.0000 mg | ORAL_TABLET | Freq: Every day | ORAL | Status: DC
  Administered 2011-07-25 – 2011-07-28 (×4): 4 mg via ORAL
  Filled 2011-07-25 (×5): qty 1

## 2011-07-25 MED ORDER — FLUTICASONE-SALMETEROL 250-50 MCG/DOSE IN AEPB: 1.0000 | INHALATION_SPRAY | Freq: Two times a day (BID) | RESPIRATORY_TRACT | Status: DC

## 2011-07-25 MED ORDER — GLYBURIDE 5 MG PO TABS
10.0000 mg | ORAL_TABLET | Freq: Every day | ORAL | Status: DC
  Administered 2011-07-26 – 2011-07-29 (×4): 10 mg via ORAL
  Filled 2011-07-25 (×6): qty 2

## 2011-07-25 MED ORDER — WARFARIN - PHARMACIST DOSING INPATIENT
Freq: Every day | Status: DC
  Administered 2011-07-28: 18:00:00

## 2011-07-25 MED ORDER — INSULIN GLARGINE 100 UNIT/ML ~~LOC~~ SOLN
20.0000 [IU] | Freq: Every day | SUBCUTANEOUS | Status: DC
  Administered 2011-07-25 – 2011-07-26 (×2): 20 [IU] via SUBCUTANEOUS

## 2011-07-25 MED ORDER — POLYETHYLENE GLYCOL 3350 17 G PO PACK
17.0000 g | PACK | Freq: Two times a day (BID) | ORAL | Status: DC
  Administered 2011-07-27 – 2011-07-28 (×2): 17 g via ORAL
  Filled 2011-07-25 (×9): qty 1

## 2011-07-25 MED ORDER — FINASTERIDE 5 MG PO TABS
5.0000 mg | ORAL_TABLET | Freq: Every day | ORAL | Status: DC
  Administered 2011-07-25 – 2011-07-29 (×5): 5 mg via ORAL
  Filled 2011-07-25 (×5): qty 1

## 2011-07-25 MED ORDER — FUROSEMIDE 20 MG PO TABS
20.0000 mg | ORAL_TABLET | Freq: Every day | ORAL | Status: DC
Start: 2011-07-26 — End: 2011-07-29
  Administered 2011-07-26 – 2011-07-29 (×4): 20 mg via ORAL
  Filled 2011-07-25 (×4): qty 1

## 2011-07-25 MED ORDER — LEVALBUTEROL HCL 0.63 MG/3ML IN NEBU: 0.6300 mg | INHALATION_SOLUTION | Freq: Four times a day (QID) | RESPIRATORY_TRACT | Status: DC

## 2011-07-25 MED ORDER — CLOPIDOGREL BISULFATE 75 MG PO TABS
75.0000 mg | ORAL_TABLET | Freq: Every day | ORAL | Status: DC
  Administered 2011-07-26 – 2011-07-29 (×4): 75 mg via ORAL
  Filled 2011-07-25 (×5): qty 1

## 2011-07-25 MED ORDER — GLYBURIDE 5 MG PO TABS
5.0000 mg | ORAL_TABLET | Freq: Every day | ORAL | Status: DC
  Administered 2011-07-25 – 2011-07-28 (×4): 5 mg via ORAL
  Filled 2011-07-25 (×5): qty 1

## 2011-07-25 MED ORDER — SIMVASTATIN 20 MG PO TABS
20.0000 mg | ORAL_TABLET | Freq: Every day | ORAL | Status: DC
  Administered 2011-07-25: 20 mg via ORAL
  Filled 2011-07-25 (×2): qty 1

## 2011-07-25 MED ORDER — HYDROCODONE-ACETAMINOPHEN 5-325 MG PO TABS: 1.0000 | ORAL_TABLET | Freq: Three times a day (TID) | ORAL | Status: DC | PRN

## 2011-07-25 MED ORDER — METOPROLOL TARTRATE 50 MG PO TABS
50.0000 mg | ORAL_TABLET | Freq: Two times a day (BID) | ORAL | Status: DC
  Administered 2011-07-25 – 2011-07-29 (×8): 50 mg via ORAL
  Filled 2011-07-25 (×9): qty 1

## 2011-07-25 MED ORDER — SODIUM CHLORIDE 0.9 % IV SOLN
Freq: Once | INTRAVENOUS | Status: AC
  Administered 2011-07-25: 09:00:00 via INTRAVENOUS

## 2011-07-25 MED ORDER — BIOTENE DRY MOUTH MT LIQD
15.0000 mL | Freq: Two times a day (BID) | OROMUCOSAL | Status: DC
  Administered 2011-07-25 – 2011-07-29 (×6): 15 mL via OROMUCOSAL

## 2011-07-25 MED ORDER — ACETAMINOPHEN 325 MG PO TABS: 650.0000 mg | ORAL_TABLET | Freq: Four times a day (QID) | ORAL | Status: DC | PRN

## 2011-07-25 MED ORDER — FLUTICASONE-SALMETEROL 250-50 MCG/DOSE IN AEPB
1.0000 | INHALATION_SPRAY | Freq: Two times a day (BID) | RESPIRATORY_TRACT | Status: DC
  Administered 2011-07-25 – 2011-07-29 (×8): 1 via RESPIRATORY_TRACT
  Filled 2011-07-25: qty 14

## 2011-07-25 MED ORDER — LEVALBUTEROL HCL 0.63 MG/3ML IN NEBU
0.6300 mg | INHALATION_SOLUTION | Freq: Four times a day (QID) | RESPIRATORY_TRACT | Status: DC
  Administered 2011-07-25 – 2011-07-29 (×15): 0.63 mg via RESPIRATORY_TRACT
  Filled 2011-07-25 (×19): qty 3

## 2011-07-25 MED ORDER — GLYBURIDE 5 MG PO TABS
5.0000 mg | ORAL_TABLET | Freq: Two times a day (BID) | ORAL | Status: DC
  Filled 2011-07-25 (×2): qty 2

## 2011-07-25 NOTE — Progress Notes (Signed)
ANTICOAGULATION CONSULT NOTE - Follow Up Consult ANTIBIOTIC CONSULT NOTE - Initial   Pharmacy Consult:  Coumadin / Vancomycin Indication:  Atrial fibrillation / Suspected PNA  Allergies  Allergen Reactions  . Aspirin Shortness Of Breath and Rash    Patient Measurements: Height: 5' 10.87" (180 cm) Weight: 178 lb 9.2 oz (81 kg) IBW/kg (Calculated) : 74.99   Vital Signs: Temp: 98.7 F (37.1 C) (05/30 0853) Temp src: Oral (05/30 0853) BP: 150/80 mmHg (05/30 1330) Pulse Rate: 117  (05/30 1330)  Labs:  [facility] 07/25/11 0915  HGB 9.0*  HCT 26.9*  PLT 123*  APTT --  LABPROT 38.9*  INR 3.91*  HEPARINUNFRC --  CREATININE 0.86  CKTOTAL --  CKMB --  TROPONINI --    Estimated Creatinine Clearance: 64.2 ml/min (by C-G formula based on Cr of 0.86).     Assessment: 89 YOM c/o SOB, DOE, cough, sputum production and Afib with RVR.  Pharmacy consulted to manage Coumadin therapy for h/o Afib.  Noted that patient received Vit K 5mg  x 2 doses for elevated INR and it remains elevated today, no bleeding documented.  Patient is also being treated for suspected HCAP and Pharmacy is managing vancomycin.  His renal function is stable.  Noted patient received vanc 1500mg  IV x 1 around 1215 and Zosyn 3.375gm IV x 1 around 1130 today.   Goal of Therapy:  INR 2-3 Monitor platelets by anticoagulation protocol: Yes Vanc trough: 15-20 mcg/mL    Plan:  - Continue to hold Coumadin today - Daily PT / INR - Vanc 1gm IV Q12H, start midnight tonight - Change Zosyn to 3.375gm IV Q8H (4 hr infusion) for CrCL > 20 ml/min - Monitor renal fxn, C/S, clinical status, vanc trough if indicated     Charmelle Soh D. Laney Potash, PharmD, BCPS Pager:  [phone] 07/25/2011, 2:54 PM

## 2011-07-25 NOTE — Progress Notes (Signed)
Reason for Consult: Atrial fibrilation  Requesting Physician: Dr Timothy Lasso  HPI: This is a 78 y.o. male with a past medical history significant for aortic stenosis, S/P tissue AVR Nov 2012. He had Nl Cors and Nl LVF then. In April 2013 he had RLE PTA and stenting for claudication 06/16/11. He was on his way to our office for follow up 06/27/11 when he slipped and fell, suffering a Rt hip fracture. He had ORIF 06/27/11 by Dr Rennis Chris. Post op he had a gluteal hematoma while being Coumadinized and his Coumadin was stopped. He was discharged to a SNF for rehab. Coumadin resumed approximately one week ago. Antibiotics started a few days ago for cough at nursing home. His INR the last couple of days has been high and he has recently received Vit K. Last night he became increasingly SOB. He is admitted now with Lt pneumonia. We are asked to see because of rapid AF.  PMHx:  Past Medical History  Diagnosis Date  . AS (aortic stenosis)   . DM2 (diabetes mellitus, type 2)   . HTN (hypertension)   . Heart murmur   . Thrombocytopenia due to drugs     seen by [name] plts [id number] no rx  . Cancer     Bladder Cancer local  . Peripheral vascular disease very poor circulation legs and feet ... stents right and left legs... done in [name] 's office.   . Depression wife died 4 years ago.    Marland Kitchen COPD (chronic obstructive pulmonary disease)   . Dysrhythmia 5-7 weeks ago ... sees dr Doroteo Glassman fibrilation  . Shortness of breath   . Recurrent upper respiratory infection (URI)     sinusitis  . Neuropathy due to secondary diabetes   . Chronic kidney disease     renal artery stent  . Claudication in peripheral vascular disease 06/17/2011  . S/P angioplasty with stent, diamond back rotational athrectomy Prox. Rt. SFA 06/17/2011 06/17/2011  . Bundle branch block, left    Past Surgical History  Procedure Date  . Renal artery stent     2006 left renal stent  . Peripheral arterial stent graft     2006 left  anf right illiac stents Dr Orson Slick  . Aortic valve replacement 01/07/2011    Procedure: AORTIC VALVE REPLACEMENT (AVR);  Surgeon: Delight Ovens, MD;  Location: [facility] OR;  Service: Open Heart Surgery;  Laterality: N/A;  . Cardioversion 04/03/2011    Procedure: CARDIOVERSION;  Surgeon: Marykay Lex, MD;  Location: [facility] OR;  Service: Cardiovascular;  Laterality: N/A;  . Atherectomy 06/17/2011    balloon atherectomy  . Femur im nail 06/27/2011    Procedure: INTRAMEDULLARY (IM) NAIL FEMORAL;  Surgeon: Senaida Lange, MD;  Location: [facility] ORS;  Service: Orthopedics;  Laterality: Right;    FAMHx: Family History  Problem Relation Age of Onset  . Heart disease Mother   . Cancer Brother   . Stroke Father     SOCHx:  reports that he has been smoking Cigarettes.  He has a 150 pack-year smoking history. He has never used smokeless tobacco. He reports that he drinks about 1.2 ounces of alcohol per week. He reports that he does not use illicit drugs.  ALLERGIES: Allergies  Allergen Reactions  . Aspirin Shortness Of Breath and Rash    ROS: Pertinent items are noted in HPI.  HOME MEDICATIONS: Prescriptions prior to admission  Medication Sig Dispense Refill  . acetaminophen (TYLENOL) 325 MG  tablet Take 650 mg by mouth every 6 (six) hours as needed. For pain      . benazepril (LOTENSIN) 20 MG tablet Take 20 mg by mouth daily.      . clopidogrel (PLAVIX) 75 MG tablet ONE PO Daily - restart in 1-2 weeks - when safe      . diltiazem (DILACOR XR) 240 MG 24 hr capsule Take 240 mg by mouth daily.      Marland Kitchen doxazosin (CARDURA) 4 MG tablet Take 4 mg by mouth at bedtime.      . finasteride (PROSCAR) 5 MG tablet Take 5 mg by mouth daily.        . Fluticasone-Salmeterol (ADVAIR) 250-50 MCG/DOSE AEPB Inhale 1 puff into the lungs 2 (two) times daily.  28 each  0  . furosemide (LASIX) 20 MG tablet Take 20 mg by mouth daily.        Marland Kitchen glyBURIDE (DIABETA) 5 MG tablet Take 5-10 mg by mouth 2 (two) times daily. 2 tablets  in the morning and 1 tablet in the evening      . HYDROcodone-acetaminophen (NORCO) 5-325 MG per tablet Take 1 tablet by mouth every 8 (eight) hours. Takes at 0600, 1400, 2200      . insulin glargine (LANTUS) 100 UNIT/ML injection Inject 18 Units into the skin at bedtime.  10 mL    . loratadine (CLARITIN) 10 MG tablet Take 1 tablet (10 mg total) by mouth daily.      . metoprolol (LOPRESSOR) 50 MG tablet Take 50 mg by mouth 2 (two) times daily.      . polyethylene glycol (MIRALAX / GLYCOLAX) packet Take 17 g by mouth 2 (two) times daily.      . potassium chloride SA (K-DUR,KLOR-CON) 20 MEQ tablet Take 20 mEq by mouth daily.      . simvastatin (ZOCOR) 20 MG tablet Take 20 mg by mouth at bedtime.          HOSPITAL MEDICATIONS: I have reviewed the patient's current medications.  VITALS: Blood pressure 133/73, pulse 121, temperature 98.7 F (37.1 C), temperature source Oral, resp. rate 16, height 5' 10.87" (1.8 m), weight 81 kg (178 lb 9.2 oz), SpO2 95.00%.  PHYSICAL EXAM: General appearance: alert, cooperative, no distress and chronically ill appearing Neck: no carotid bruit, no JVD, supple, symmetrical, trachea midline and thyroid not enlarged, symmetric, no tenderness/mass/nodules Lungs: diffuse rhonchi Heart: irregularly irregular rhythm Abdomen: not distended, not tender Extremities: trace edema on Rt Pulses: diminnished distal pulses Skin: pale, cool and dry, lot of echymotic areas Neurologic: Grossly normal  LABS: Results for orders placed during the hospital encounter of 07/25/11 (from the past 48 hour(s))  CBC     Status: Abnormal   Collection Time   07/25/11  9:15 AM      Component Value Range Comment   WBC 19.1 (*) 4.0 - 10.5 (K/uL)    RBC 2.99 (*) 4.22 - 5.81 (MIL/uL)    Hemoglobin 9.0 (*) 13.0 - 17.0 (g/dL)    HCT 16.1 (*) 09.6 - 52.0 (%)    MCV 90.0  78.0 - 100.0 (fL)    MCH 30.1  26.0 - 34.0 (pg)    MCHC 33.5  30.0 - 36.0 (g/dL)    RDW 04.5 (*) 40.9 - 15.5 (%)     Platelets 123 (*) 150 - 400 (K/uL)   DIFFERENTIAL     Status: Abnormal   Collection Time   07/25/11  9:15 AM      Component  Value Range Comment   Neutrophils Relative 79 (*) 43 - 77 (%)    Lymphocytes Relative 8 (*) 12 - 46 (%)    Monocytes Relative 11  3 - 12 (%)    Eosinophils Relative 2  0 - 5 (%)    Basophils Relative 0  0 - 1 (%)    Neutro Abs 15.1 (*) 1.7 - 7.7 (K/uL)    Lymphs Abs 1.5  0.7 - 4.0 (K/uL)    Monocytes Absolute 2.1 (*) 0.1 - 1.0 (K/uL)    Eosinophils Absolute 0.4  0.0 - 0.7 (K/uL)    Basophils Absolute 0.0  0.0 - 0.1 (K/uL)    WBC Morphology TOXIC GRANULATION   DOHLE BODIES  COMPREHENSIVE METABOLIC PANEL     Status: Abnormal   Collection Time   07/25/11  9:15 AM      Component Value Range Comment   Sodium 138  135 - 145 (mEq/L)    Potassium 3.8  3.5 - 5.1 (mEq/L)    Chloride 101  96 - 112 (mEq/L)    CO2 25  19 - 32 (mEq/L)    Glucose, Bld 92  70 - 99 (mg/dL)    BUN 25 (*) 6 - 23 (mg/dL)    Creatinine, Ser 0.98  0.50 - 1.35 (mg/dL)    Calcium 9.0  8.4 - 10.5 (mg/dL)    Total Protein 6.2  6.0 - 8.3 (g/dL)    Albumin 2.2 (*) 3.5 - 5.2 (g/dL)    AST 46 (*) 0 - 37 (U/L)    ALT 68 (*) 0 - 53 (U/L)    Alkaline Phosphatase 143 (*) 39 - 117 (U/L)    Total Bilirubin 1.2  0.3 - 1.2 (mg/dL)    GFR calc non Af Amer 76 (*) >90 (mL/min)    GFR calc Af Amer 88 (*) >90 (mL/min)   LACTIC ACID, PLASMA     Status: Normal   Collection Time   07/25/11  9:15 AM      Component Value Range Comment   Lactic Acid, Venous 1.0  0.5 - 2.2 (mmol/L)   PROTIME-INR     Status: Abnormal   Collection Time   07/25/11  9:15 AM      Component Value Range Comment   Prothrombin Time 38.9 (*) 11.6 - 15.2 (seconds)    INR 3.91 (*) 0.00 - 1.49    PRO B NATRIURETIC PEPTIDE     Status: Abnormal   Collection Time   07/25/11  9:15 AM      Component Value Range Comment   Pro B Natriuretic peptide (BNP) 2573.0 (*) 0 - 450 (pg/mL)   URINALYSIS, ROUTINE W REFLEX MICROSCOPIC     Status: Abnormal    Collection Time   07/25/11 10:28 AM      Component Value Range Comment   Color, Urine YELLOW  YELLOW     APPearance CLEAR  CLEAR     Specific Gravity, Urine 1.018  1.005 - 1.030     pH 7.0  5.0 - 8.0     Glucose, UA NEGATIVE  NEGATIVE (mg/dL)    Hgb urine dipstick NEGATIVE  NEGATIVE     Bilirubin Urine NEGATIVE  NEGATIVE     Ketones, ur NEGATIVE  NEGATIVE (mg/dL)    Protein, ur 30 (*) NEGATIVE (mg/dL)    Urobilinogen, UA 4.0 (*) 0.0 - 1.0 (mg/dL)    Nitrite NEGATIVE  NEGATIVE     Leukocytes, UA NEGATIVE  NEGATIVE    URINE MICROSCOPIC-ADD  ON     Status: Normal   Collection Time   07/25/11 10:28 AM      Component Value Range Comment   Squamous Epithelial / LPF RARE  RARE     WBC, UA 0-2  <3 (WBC/hpf)    RBC / HPF 0-2  <3 (RBC/hpf)    Bacteria, UA RARE  RARE    GLUCOSE, CAPILLARY     Status: Normal   Collection Time   07/25/11  2:33 PM      Component Value Range Comment   Glucose-Capillary 74  70 - 99 (mg/dL)     IMAGING: Dg Chest 2 View  07/25/2011  *RADIOLOGY REPORT*  Clinical Data: Shortness of breath, cough.  CHEST - 2 VIEW  Comparison: 06/29/2011  Findings: Prior median sternotomy and valve replacement.  Diffuse airspace disease throughout the left lung concerning for infection/pneumonia.  Right lung is clear.  Heart is borderline in size.  Small left pleural effusion.  Degenerative changes in the thoracic spine.  IMPRESSION: Diffuse left lung airspace disease concerning for pneumonia.  Small left effusion.  Original Report Authenticated By: Cyndie Chime, M.D.    IMPRESSION: Principal Problem:  *PNA (pneumonia) Active Problems:  Leukocytosis  Coumadin resumed one week ago for CAF, INR supratheraputic secondary to anibiotics  Persistent atrial fibrillation, rate elevated on adm  DIABETES MELLITUS, TYPE II  HYPERLIPIDEMIA  HYPERTENSION  PVD, Rt SFA PTA/HSRA 06/17/11  S/P aortic valve replacement: #23 Magna Ease Edwards Pericardial Valve  November 2012  Aspirin allergy,  rash, SOB. Pt is on Plavix  Closed right hip fracture, ORIF 06/27/11 complicated by gluteal hematoma while being Coumadinized post op  Normal coronary arteries, cath 11/12  Normal left ventricular systolic function, cath 11/12   RECOMMENDATION: Will start Diltiazem drip. MD to see.  Time Spent Directly with Patient: 45 minutes  KILROY,LUKE K 07/25/2011, 3:21 PM   Agree with note written by Corine Shelter PAC  Pt well known to me admitted with community acquired pneumonia and AFIB with RVR. Exam benign. On ATBX. INR supra therapeutic. On IV dilt for rate control. Suspect this will improve with treatment of pneumonia. Will follow along with you.  [name] 07/25/2011 5:29 PM

## 2011-07-25 NOTE — ED Notes (Signed)
Pt. Reports "feeling SOB on and off x 5days.  It comes and goes". States " I woke up around 3am because I couldn't breathe."  Pt. Has a hxt of SOB. Reports Right hip fracture 06/26/11.  Pt. Denies pain, headache, chest pain, and dizziness. A.O. X 4. NAD

## 2011-07-25 NOTE — H&P (Signed)
Ronald [name] is an 78 y.o. male.   PCP:   Ronald Pounds, MD, MD   Chief Complaint:  SOB/DOE/Cough/Sputum/AFib/RVR  HPI: 44 Male with MMP, Vasculopathy, Afib, recent Hip fracture getting rehab at [facility] and was getting ready to d/c in the next one week. For the last one week he has been having increased DOE and coughing.  He awoke at 2 am with increased SOB and needing "air".  He was assessed and given treatments.  When they did not help he was transported by EMS to the [facility].  Here his WBC is > 19k and his CXR is C/w PNA.  He was given O2 and Abx and I was called for inpt admission.  His HR is Afib with RVR @ 130.  According to [facility] - Coumadin was re-started @ 1 week ago and Levaquin was atarted 5/29th.  He had Vit K 5mg  yesterday and 5/28th as well.     Past Medical History:  Past Medical History  Diagnosis Date  . AS (aortic stenosis)   . DM2 (diabetes mellitus, type 2)   . HTN (hypertension)   . Heart murmur   . Thrombocytopenia due to drugs     seen by Ronald [name] plts [id number] no rx  . Cancer     Bladder Cancer local  . Peripheral vascular disease very poor circulation legs and feet ... stents right and left legs... done in Ronald [name] 's [name].   . Depression wife died 4 years ago.    Marland Kitchen COPD (chronic obstructive pulmonary disease)   . Dysrhythmia 5-7 weeks ago ... sees Ronald [name] fibrilation  . Shortness of breath   . Recurrent upper respiratory infection (URI)     sinusitis  . Neuropathy due to secondary diabetes   . Chronic kidney disease     renal artery stent  . Claudication in peripheral vascular disease 06/17/2011  . S/P angioplasty with stent, diamond back rotational athrectomy Prox. Rt. SFA 06/17/2011 06/17/2011  . Bundle branch block, left     Past Surgical History  Procedure Date  . Renal artery stent     2006 left renal stent  . Peripheral arterial stent graft     2006 left anf right illiac stents Ronald [name]  . Aortic valve replacement 01/07/2011     Procedure: AORTIC VALVE REPLACEMENT (AVR);  Surgeon: Ronald Ovens, MD;  Location: [facility] OR;  Service: Open Heart Surgery;  Laterality: N/A;  . Cardioversion 04/03/2011    Procedure: CARDIOVERSION;  Surgeon: Ronald Lex, MD;  Location: [facility] OR;  Service: Cardiovascular;  Laterality: N/A;  . Atherectomy 06/17/2011    balloon atherectomy  . Femur im nail 06/27/2011    Procedure: INTRAMEDULLARY (IM) NAIL FEMORAL;  Surgeon: Ronald Lange, MD;  Location: [facility] ORS;  Service: Orthopedics;  Laterality: Right;      Allergies:   Allergies  Allergen Reactions  . Aspirin Shortness Of Breath and Rash     Medications: Prior to Admission medications   Medication Sig Start Date End Date Taking? Authorizing Provider  acetaminophen (TYLENOL) 325 MG tablet Take 650 mg by mouth every 6 (six) hours as needed. For pain 07/02/11 07/01/12 Yes Ronald Pounds, MD  benazepril (LOTENSIN) 20 MG tablet Take 20 mg by mouth daily. 07/02/11 07/01/12 Yes Ronald Pounds, MD  clopidogrel (PLAVIX) 75 MG tablet ONE PO Daily - restart in 1-2 weeks - when safe 07/02/11  Yes Ronald Pounds, MD  diltiazem [facility]  XR) 240 MG 24 hr capsule Take 240 mg by mouth daily.   Yes Historical Provider, MD  doxazosin (CARDURA) 4 MG tablet Take 4 mg by mouth at bedtime. 01/10/11  Yes Ronald Pali, PA  finasteride (PROSCAR) 5 MG tablet Take 5 mg by mouth daily.     Yes Historical Provider, MD  Fluticasone-Salmeterol (ADVAIR) 250-50 MCG/DOSE AEPB Inhale 1 puff into the lungs 2 (two) times daily. 01/17/11  Yes Ronald Margaretann Loveless, PA  furosemide (LASIX) 20 MG tablet Take 20 mg by mouth daily.     Yes Historical Provider, MD  glyBURIDE (DIABETA) 5 MG tablet Take 5-10 mg by mouth 2 (two) times daily. 2 tablets in the morning and 1 tablet in the evening   Yes Historical Provider, MD  HYDROcodone-acetaminophen (NORCO) 5-325 MG per tablet Take 1 tablet by mouth every 8 (eight) hours. Takes at 0600, 1400, 2200   Yes Historical Provider, MD  insulin aspart  (NOVOLOG) 100 UNIT/ML injection Subcutaneous, 3 times daily with meals Correction coverage: Sensitive (thin, NPO, renal) CBG < 70: implement hypoglycemia protocol CBG 70 - 120: 0 units CBG 121 - 150: 1 unit CBG 151 - 200: 2 units CBG 201 - 250: 3 units CBG 251 - 300: 5 units CBG 301 - 350: 7 units CBG 351 - 400: 9 units CBG > 400: call MD 07/02/11  Yes Ronald Pounds, MD  insulin glargine (LANTUS) 100 UNIT/ML injection Inject 18 Units into the skin at bedtime. 07/02/11 07/01/12 Yes Ronald Pounds, MD  loratadine (CLARITIN) 10 MG tablet Take 1 tablet (10 mg total) by mouth daily. 01/10/11  Yes Ronald Pali, PA  metoprolol (LOPRESSOR) 50 MG tablet Take 50 mg by mouth 2 (two) times daily.   Yes Historical Provider, MD  polyethylene glycol (MIRALAX / GLYCOLAX) packet Take 17 g by mouth 2 (two) times daily.   Yes Historical Provider, MD  potassium chloride SA (K-DUR,KLOR-CON) 20 MEQ tablet Take 20 mEq by mouth daily.   Yes Historical Provider, MD  simvastatin (ZOCOR) 20 MG tablet  Take 20 mg by mouth at bedtime.     Yes Historical Provider, MD      (Not in a hospital admission)   Social History:  reports that he has been smoking Cigarettes.  He has a 150 pack-year smoking history. He has never used smokeless tobacco. He reports that he drinks about 1.2 ounces of alcohol per week. He reports that he does not use illicit drugs.  Family History: Family History  Problem Relation Age of Onset  . Heart disease Mother   . Cancer Brother   . Stroke Father     Review of Systems:  Review of Systems - See hPI. He has made progress with rehab recently. He is weak, sob, sputum, cough, congested. No CP or palpitations. No issue with Bowel or Bladder. Hip is holding up and Hematoma better. He is sick All other Systems reviewed.   Physical Exam:  Blood pressure 151/70, pulse 117, temperature 98.7 F (37.1 C), temperature source Oral, resp. rate 28, SpO2 96.00%. Filed Vitals:   07/25/11 0853  07/25/11 0900 07/25/11 1045 07/25/11 1130  BP: 153/81  149/88 151/70  Pulse: 114 121 120 117  Temp: 98.7 F (37.1 C)     TempSrc: Oral     Resp: 26 23 29 28   SpO2: 98% 95% 95% 96%   General appearance: A and o and Ill appearing Head: Normocephalic, without obvious abnormality, atraumatic Eyes: conjunctivae/corneas clear. PERRL, EOM's  intact.  Op - Dry Neck: no adenopathy, no carotid bruit, no JVD and thyroid not enlarged, symmetric, no tenderness/mass/nodules Resp: Rhonchi B Cardio: Sternotomy. HR Rapid and irregular GI: soft, non-tender; bowel sounds normal; no masses,  no organomegaly Extremities: extremities normal, atraumatic, no cyanosis or edema Pulses: 1+ and symmetric Lymph nodes: no cervical lymphadenopathy Neurologic: Alert and oriented X 3, improved strength and tone from last hospitalization     Labs on Admission:   [facility] 07/25/11 0915  NA 138  K 3.8  CL 101  CO2 25  GLUCOSE 92  BUN 25*  CREATININE 0.86  CALCIUM 9.0  MG --  PHOS --    Basename 07/25/11 0915  AST 46*  ALT 68*  ALKPHOS 143*  BILITOT 1.2  PROT 6.2  ALBUMIN 2.2*   No results found for this basename: LIPASE:2,AMYLASE:2 in the last 72 hours  Basename 07/25/11 0915  WBC 19.1*  NEUTROABS 15.1*  HGB 9.0*  HCT 26.9*  MCV 90.0  PLT 123*   No results found for this basename: CKTOTAL:3,CKMB:3,CKMBINDEX:3,TROPONINI:3 in the last 72 hours Lab Results  Component Value Date   INR 3.91* 07/25/2011   INR 1.23 07/02/2011   INR 1.31 07/01/2011     LAB RESULT POCT:  Results for orders placed during the hospital encounter of 07/25/11  CBC      Component Value Range   WBC 19.1 (*) 4.0 - 10.5 (K/uL)   RBC 2.99 (*) 4.22 - 5.81 (MIL/uL)   Hemoglobin 9.0 (*) 13.0 - 17.0 (g/dL)   HCT 84.1 (*) 32.4 - 52.0 (%)   MCV 90.0  78.0 - 100.0 (fL)   MCH 30.1  26.0 - 34.0 (pg)   MCHC 33.5  30.0 - 36.0 (g/dL)   RDW 40.1 (*) 02.7 - 15.5 (%)   Platelets 123 (*) 150 - 400 (K/uL)  DIFFERENTIAL       Component Value Range   Neutrophils Relative 79 (*) 43 - 77 (%)   Lymphocytes Relative 8 (*) 12 - 46 (%)   Monocytes Relative 11  3 - 12 (%)   Eosinophils Relative 2  0 - 5 (%)   Basophils Relative 0  0 - 1 (%)   Neutro Abs 15.1 (*) 1.7 - 7.7 (K/uL)   Lymphs Abs 1.5  0.7 - 4.0 (K/uL)   Monocytes Absolute 2.1 (*) 0.1 - 1.0 (K/uL)   Eosinophils Absolute 0.4  0.0 - 0.7 (K/uL)   Basophils Absolute 0.0  0.0 - 0.1 (K/uL)   WBC Morphology TOXIC GRANULATION    COMPREHENSIVE METABOLIC PANEL      Component Value Range   Sodium 138  135 - 145 (mEq/L)   Potassium 3.8  3.5 - 5.1 (mEq/L)   Chloride 101  96 - 112 (mEq/L)   CO2 25  19 - 32 (mEq/L)   Glucose, Bld 92  70 - 99 (mg/dL)   BUN 25 (*) 6 - 23 (mg/dL)   Creatinine, Ser 2.53  0.50 - 1.35 (mg/dL)   Calcium 9.0  8.4 - 66.4 (mg/dL)   Total Protein 6.2  6.0 - 8.3 (g/dL)   Albumin 2.2 (*) 3.5 - 5.2 (g/dL)   AST 46 (*) 0 - 37 (U/L)   ALT 68 (*) 0 - 53 (U/L)   Alkaline Phosphatase 143 (*) 39 - 117 (U/L)   Total Bilirubin 1.2  0.3 - 1.2 (mg/dL)   GFR calc non Af Amer 76 (*) >90 (mL/min)   GFR calc Af Amer 88 (*) >90 (mL/min)  LACTIC ACID, PLASMA      Component Value Range   Lactic Acid, Venous 1.0  0.5 - 2.2 (mmol/L)  PROTIME-INR      Component Value Range   Prothrombin Time 38.9 (*) 11.6 - 15.2 (seconds)   INR 3.91 (*) 0.00 - 1.49   PRO B NATRIURETIC PEPTIDE      Component Value Range   Pro B Natriuretic peptide (BNP) 2573.0 (*) 0 - 450 (pg/mL)  URINALYSIS, ROUTINE W REFLEX MICROSCOPIC      Component Value Range   Color, Urine YELLOW  YELLOW    APPearance CLEAR  CLEAR    Specific Gravity, Urine 1.018  1.005 - 1.030    pH 7.0  5.0 - 8.0    Glucose, UA NEGATIVE  NEGATIVE (mg/dL)   Hgb urine dipstick NEGATIVE  NEGATIVE    Bilirubin Urine NEGATIVE  NEGATIVE    Ketones, ur NEGATIVE  NEGATIVE (mg/dL)   Protein, ur 30 (*) NEGATIVE (mg/dL)   Urobilinogen, UA 4.0 (*) 0.0 - 1.0 (mg/dL)   Nitrite NEGATIVE  NEGATIVE    Leukocytes, UA  NEGATIVE  NEGATIVE   URINE MICROSCOPIC-ADD ON      Component Value Range   Squamous Epithelial / LPF RARE  RARE    WBC, UA 0-2  <3 (WBC/hpf)   RBC / HPF 0-2  <3 (RBC/hpf)   Bacteria, UA RARE  RARE       Radiological Exams on Admission: Dg Chest 2 View  07/25/2011  *RADIOLOGY REPORT*  Clinical Data: Shortness of breath, cough.  CHEST - 2 VIEW  Comparison: 06/29/2011  Findings: Prior median sternotomy and valve replacement.  Diffuse airspace disease throughout the left lung concerning for infection/pneumonia.  Right lung is clear.  Heart is borderline in size.  Small left pleural effusion.  Degenerative changes in the thoracic spine.  IMPRESSION: Diffuse left lung airspace disease concerning for pneumonia.  Small left effusion.  Original Report Authenticated By: Cyndie Chime, M.D.      Orders placed during the hospital encounter of 07/25/11  . EKG 12-LEAD  . EKG 12-LEAD     Assessment/Plan Principal Problem:  *PNA (pneumonia) Active Problems:  DIABETES MELLITUS, TYPE II  PVD, Rt SFA PTA/HSRA 06/17/11  Leukocytosis  Chronic anticoagulation with Coumadin for CAF  Persistent atrial fibrillation,   PNA - poss Hosp Acquired in this vasculopathic male with COPD who still smokes.  He has been continuously ill since his valve replacement and needs admission to stepdown - Abx, O2, Nebs, pulmonary toilet, Cardizem gtt, and cards consult.  Full code.  significant vasculopathy - continue plavix,   AFib with RVR. Cardiologist is Ronald Allyson [name] and we will consult.  Cardizem 360 and Lopressor 50 BID is outpt regimen. HD stable despite HR 120-140.  HR up with illness. He was restarted on coumadin 5/22 - INR supratherapeutic today. Pharm to dose. cardizem gtt.  AV stenosis S/P Bioprosthetic aortic valve replacement in November 2012,     COPD (still smoking)  -  Steroids.  S/P traumatic fall 06/26/11 and R Hip fracture S/P ORIF complicated by R Thigh hematoma. D/ced to [facility]  07/02/11.  HTN- HD stable despite being sick  Tobacco abuse - needs to quit  continue Advair inhaler. we'll watch the need for nebulizer treatment and pulmonary toilet,   Anemia with hbg 9.0 and recent Post op Blood Loss anemia  S/P a total of 4 PRBCs due the the large hematoma   Hyperlipidemia on Zocor   Peripheral vascular  disease S/P recent PTCA - on Plavix , complicated by smoking, followed by Ronald. Allyson [name]   DM2 with complications- ISS, last hemoglobin A1c 8.1%  complicated by vascular disease and potential neuropathy. Lantus /ISS. Glyburide.  Osteoporosis - will get DEXA and be evaluated for meds as an outpt.      Caria Transue M 07/25/2011, 1:48 PM

## 2011-07-25 NOTE — ED Provider Notes (Signed)
I have seen and examined this patient with the resident.  I agree with the resident's note, assessment and plan except as indicated.    Patient presents with increased shortness of breath and some A. fib with RVR.  Patient has known A. fib at baseline.  Patient has been found to have a pneumonia on his x-ray which will be treated with hospital-acquired antibiotics given that the patient has been admitted to the hospital for surgery in the last month and has been seeking care at a nursing facility.  Patient's blood pressures have remained normal.  He is receiving IV fluids for hydration and will require admission due to his pneumonia with continued tachycardia.  Nat Christen, MD 07/25/11 1110

## 2011-07-25 NOTE — Progress Notes (Signed)
Cardiac Rehabilitation Program Outcomes Report   Orientation:  02/07/2011 Graduate Date:  06/12/2011  # of sessions completed: 27/26  Cardiologist: Allyson Sabal Family MD:  Class Time:  0945  A.  Exercise Program:  Tolerates exercise @ 2.7 METS for 30 minutes, Bike Test Results:  Pre: 0.68 mile and Post: 0.82 mile, Improved functional capacity  20.6 %, Improved  muscular strength  22.9 %, No Change  flexibility 0 % and Needs encouragement on exercise program  B.  Mental Health:  Good mental attitude and Quality of Life (QOL)  Pre Scores Only:  Overall  24.71, Health/Functioning 21.60, Socioeconomics 25.50, Psych/Spiritual 27.43, Family 30    C.  Education/Instruction/Skills  Knows THR for exercise, Uses Perceived Exertion Scale and/or Dyspnea Scale and Attended 1/15 education classes  Home exercise given: 03/27/2011  D.  Nutrition/Weight Control/Body Composition:  Pt following a step 1 Therapeutic Lifestyle Changes diet. Pt with 16.3% decrease in %body fat. Pt wt is up 2 kg. BMI 26.5 and % body fat 24.7%. Section Completed by: Mickle Plumb, M.Ed, RD, LDN, CDE  E.  Blood Lipids  No results found for this basename: CHOL, HDL, LDLCALC, LDLDIRECT, TRIG, CHOLHDL    F.  Lifestyle Changes:  Continues to smoke, No desire to stop smoking and Needs physician encouragement on making lifestyle changes  G.  Symptoms noted with exercise:  Resting hypertension and Exertional hypertension  Report Completed By:  Fabio Pierce, MA, ACSM RCEP   Comments:  Pt did fairly well in program progressing from 2.3 METs to 2.6 METs.  Pt plans to continue to exercise by walking at home.  At d/c pt was in sinus tachycardia.  Pt's mobility was greatly improved while in the program.  Progression was limited by HTN and sporadic attendance at times (busy at work).  Thanks for the referral.

## 2011-07-25 NOTE — ED Provider Notes (Signed)
History     CSN: [id number]  Arrival date & time 07/25/11  0754   None     Chief Complaint  Patient presents with  . Shortness of Breath    (Consider location/radiation/quality/duration/timing/severity/associated sxs/prior treatment) Patient is a 78 y.o. male presenting with shortness of breath. The history is provided by the patient.  Shortness of Breath  Episode onset: 2-3 days ago. The onset was gradual. The problem occurs occasionally. The problem has been unchanged. The problem is mild. The symptoms are relieved by nothing. The symptoms are aggravated by nothing. Associated symptoms include cough and shortness of breath. Pertinent negatives include no chest pain, no fever and no rhinorrhea. The cough has no precipitants. The cough is productive. Zacheriah becomes gray when coughing. Nothing relieves the cough. Nothing worsens the cough. There was no intake of a foreign body. He has had prior hospitalizations. Past medical history comments: copd.    Past Medical History  Diagnosis Date  . AS (aortic stenosis)   . DM2 (diabetes mellitus, type 2)   . HTN (hypertension)   . Heart murmur   . Thrombocytopenia due to drugs     seen by [name] plts [id number] no rx  . Cancer     Bladder Cancer local  . Peripheral vascular disease very poor circulation legs and feet ... stents right and left legs... done in [name] 's office.   . Depression wife died 4 years ago.    Marland Kitchen COPD (chronic obstructive pulmonary disease)   . Dysrhythmia 5-7 weeks ago ... sees dr Doroteo Glassman fibrilation  . Shortness of breath   . Recurrent upper respiratory infection (URI)     sinusitis  . Neuropathy due to secondary diabetes   . Chronic kidney disease     renal artery stent  . Claudication in peripheral vascular disease 06/17/2011  . S/P angioplasty with stent, diamond back rotational athrectomy Prox. Rt. SFA 06/17/2011 06/17/2011  . Bundle branch block, left     Past Surgical History  Procedure  Date  . Renal artery stent     2006 left renal stent  . Peripheral arterial stent graft     2006 left anf right illiac stents Dr Orson Slick  . Aortic valve replacement 01/07/2011    Procedure: AORTIC VALVE REPLACEMENT (AVR);  Surgeon: Delight Ovens, MD;  Location: [facility] OR;  Service: Open Heart Surgery;  Laterality: N/A;  . Cardioversion 04/03/2011    Procedure: CARDIOVERSION;  Surgeon: Marykay Lex, MD;  Location: [facility] OR;  Service: Cardiovascular;  Laterality: N/A;  . Atherectomy 06/17/2011    balloon atherectomy  . Femur im nail 06/27/2011    Procedure: INTRAMEDULLARY (IM) NAIL FEMORAL;  Surgeon: Senaida Lange, MD;  Location: [facility] ORS;  Service: Orthopedics;  Laterality: Right;    Family History  Problem Relation Age of Onset  . Heart disease Mother   . Cancer Brother   . Stroke Father     History  Substance Use Topics  . Smoking status: Current Everyday Smoker -- 2.0 packs/day for 75 years    Types: Cigarettes  . Smokeless tobacco: Never Used  . Alcohol Use: 1.2 oz/week    2 Cans of beer per week     2 beers a week      Review of Systems  Constitutional: Positive for appetite change (dec appetite). Negative for fever and chills.  HENT: Negative for rhinorrhea, drooling and neck pain.   Eyes: Negative for pain.  Respiratory:  Positive for cough and shortness of breath.   Cardiovascular: Negative for chest pain and leg swelling.  Gastrointestinal: Negative for nausea, vomiting, abdominal pain and diarrhea.  Genitourinary: Negative for dysuria and hematuria.  Musculoskeletal: Negative for gait problem.  Skin: Negative for color change.  Neurological: Negative for numbness and headaches.  Hematological: Negative for adenopathy.  Psychiatric/Behavioral: Negative for behavioral problems.  All other systems reviewed and are negative.    Allergies  Aspirin  Home Medications   Current Outpatient Rx  Name Route Sig Dispense Refill  . ACETAMINOPHEN 325 MG PO TABS Oral Take  650 mg by mouth every 6 (six) hours as needed. For pain    . BENAZEPRIL HCL 20 MG PO TABS Oral Take 20 mg by mouth daily.    Marland Kitchen CLOPIDOGREL BISULFATE 75 MG PO TABS  ONE PO Daily - restart in 1-2 weeks - when safe    . DILTIAZEM HCL ER 240 MG PO CP24 Oral Take 240 mg by mouth daily.    Marland Kitchen DOXAZOSIN MESYLATE 4 MG PO TABS Oral Take 4 mg by mouth at bedtime.    Marland Kitchen FINASTERIDE 5 MG PO TABS Oral Take 5 mg by mouth daily.      Marland Kitchen FLUTICASONE-SALMETEROL 250-50 MCG/DOSE IN AEPB Inhalation Inhale 1 puff into the lungs 2 (two) times daily. 28 each 0  . FUROSEMIDE 20 MG PO TABS Oral Take 20 mg by mouth daily.      . GLYBURIDE 5 MG PO TABS Oral Take 5-10 mg by mouth 2 (two) times daily. 2 tablets in the morning and 1 tablet in the evening    . HYDROCODONE-ACETAMINOPHEN 5-325 MG PO TABS Oral Take 1 tablet by mouth every 8 (eight) hours. Takes at 0600, 1400, 2200    . INSULIN ASPART 100 UNIT/ML [place] SOLN  Subcutaneous, 3 times daily with meals Correction coverage: Sensitive (thin, NPO, renal) CBG < 70: implement hypoglycemia protocol CBG 70 - 120: 0 units CBG 121 - 150: 1 unit CBG 151 - 200: 2 units CBG 201 - 250: 3 units CBG 251 - 300: 5 units CBG 301 - 350: 7 units CBG 351 - 400: 9 units CBG > 400: call MD    . INSULIN GLARGINE 100 UNIT/ML [place] SOLN Subcutaneous Inject 18 Units into the skin at bedtime. 10 mL   . LORATADINE 10 MG PO TABS Oral Take 1 tablet (10 mg total) by mouth daily.    Marland Kitchen METOPROLOL TARTRATE 50 MG PO TABS Oral Take 50 mg by mouth 2 (two) times daily.    Marland Kitchen POLYETHYLENE GLYCOL 3350 PO PACK Oral Take 17 g by mouth 2 (two) times daily.    Marland Kitchen POTASSIUM CHLORIDE CRYS ER 20 MEQ PO TBCR Oral Take 20 mEq by mouth daily.    Marland Kitchen SIMVASTATIN 20 MG PO TABS Oral Take 20 mg by mouth at bedtime.        SpO2 96%  Physical Exam  Nursing note and vitals reviewed. Constitutional: He is oriented to person, place, and time. He appears well-developed and well-nourished.       Mild diaphoresis  HENT:  Head:  Normocephalic and atraumatic.  Right Ear: External ear normal.  Left Ear: External ear normal.  Nose: Nose normal.  Mouth/Throat: Oropharynx is clear and moist. No oropharyngeal exudate.  Eyes: Conjunctivae and EOM are normal. Pupils are equal, round, and reactive to light.  Neck: Normal range of motion. Neck supple.  Cardiovascular: Intact distal pulses.  Exam reveals no gallop and no friction  rub.   Murmur (systolic murmur) heard.      afib w/ HR 120's  Pulmonary/Chest: Breath sounds normal. He is in respiratory distress (mild tachypnea). He has no wheezes.  Abdominal: Soft. Bowel sounds are normal. He exhibits no distension. There is no tenderness.  Musculoskeletal: Normal range of motion. He exhibits no edema and no tenderness.  Neurological: He is alert and oriented to person, place, and time.  Skin: Skin is warm and dry.  Psychiatric: He has a normal mood and affect. His behavior is normal.    ED Course  Procedures (including critical care time)  Labs Reviewed - No data to display No results found.   No diagnosis found.   Date: 07/25/2011  Rate: 114  Rhythm: Atrial fibrillation  QRS Axis: left  Intervals: QT prolonged  ST/T Wave abnormalities: normal  Conduction Disutrbances:none  Narrative Interpretation: No ST or T wave change cw ischemia  Old EKG Reviewed: unchanged    MDM  8:41 AM 78 y.o. male w hx of A fib on coumadin, COPD, aortic valve replacement, stents for PVD, s/p surgery for right hip fx approx 1 mos ago pw sob for last 2-3 days. Pt notes that he has been waking in the am w/ mild sob. He denies cp, he has had cough w/ gray sputum for approx 1-2 mos. Pt AFVSS here, pt is in afib w/ HR in 120's, BP stable. Ddx includes pna and PE. Will get labs.   10:47 AM Discussed case w/ Dr. Timothy Lasso, will plan on admission. Started IV Vanc and Zosyn for HCAP.    Clinical Impression 1. HCAP (healthcare-associated pneumonia)       Purvis Sheffield, MD 07/25/11 1709

## 2011-07-25 NOTE — ED Notes (Signed)
Internal Medicine MD in with patient

## 2011-07-25 NOTE — ED Notes (Signed)
Per EMS: Pt reports SOB. Pt. Has hxt of SOB and A fib. 12 showed Left bundle block and A. Fib. Vitals 141, 147/78,  96% on 3L [place]. 20G R Hand.

## 2011-07-25 NOTE — ED Notes (Signed)
Pt. Contacted his son by phone and updated him on admission status.

## 2011-07-25 NOTE — ED Notes (Signed)
Pt. Reports SOB "on and off x 5 days". States " I woke up around 3 am because I couldn't breath".  Pt. Reports hxt of SOB and hip fracture on 06/26/11. Pt. Denies pain, dizziness, headache. A.O. X 4. NAD. Pt reports taking Tylenol

## 2011-07-26 LAB — CBC
HCT: 26.3 % — ABNORMAL LOW (ref 39.0–52.0)
Hemoglobin: 8.8 g/dL — ABNORMAL LOW (ref 13.0–17.0)
MCV: 90.4 fL (ref 78.0–100.0)
RDW: 17.3 % — ABNORMAL HIGH (ref 11.5–15.5)
WBC: 10.9 10*3/uL — ABNORMAL HIGH (ref 4.0–10.5)

## 2011-07-26 LAB — COMPREHENSIVE METABOLIC PANEL
ALT: 73 U/L — ABNORMAL HIGH (ref 0–53)
Albumin: 2.1 g/dL — ABNORMAL LOW (ref 3.5–5.2)
Alkaline Phosphatase: 159 U/L — ABNORMAL HIGH (ref 39–117)
BUN: 29 mg/dL — ABNORMAL HIGH (ref 6–23)
Calcium: 8.9 mg/dL (ref 8.4–10.5)
Potassium: 4.4 mEq/L (ref 3.5–5.1)
Sodium: 134 mEq/L — ABNORMAL LOW (ref 135–145)
Total Protein: 6.1 g/dL (ref 6.0–8.3)

## 2011-07-26 LAB — GLUCOSE, CAPILLARY
Glucose-Capillary: 286 mg/dL — ABNORMAL HIGH (ref 70–99)
Glucose-Capillary: 253 mg/dL — ABNORMAL HIGH (ref 70–99)

## 2011-07-26 LAB — PROTIME-INR: INR: 6.55 (ref 0.00–1.49)

## 2011-07-26 MED ORDER — PHYTONADIONE 5 MG PO TABS
5.0000 mg | ORAL_TABLET | Freq: Once | ORAL | Status: AC
  Administered 2011-07-26: 5 mg via ORAL
  Filled 2011-07-26 (×2): qty 1

## 2011-07-26 MED ORDER — PREDNISONE 20 MG PO TABS
20.0000 mg | ORAL_TABLET | Freq: Every day | ORAL | Status: DC
  Administered 2011-07-27 – 2011-07-28 (×2): 20 mg via ORAL
  Filled 2011-07-26 (×4): qty 1

## 2011-07-26 MED ORDER — ENSURE COMPLETE PO LIQD: 237.0000 mL | Freq: Every day | ORAL | Status: DC

## 2011-07-26 MED ORDER — DILTIAZEM HCL 100 MG IV SOLR
5.0000 mg/h | INTRAVENOUS | Status: DC
  Filled 2011-07-26: qty 100

## 2011-07-26 MED ORDER — DILTIAZEM HCL 30 MG PO TABS
30.0000 mg | ORAL_TABLET | Freq: Once | ORAL | Status: AC
  Administered 2011-07-26: 30 mg via ORAL
  Filled 2011-07-26: qty 1

## 2011-07-26 MED ORDER — GLUCERNA SHAKE PO LIQD
237.0000 mL | Freq: Every day | ORAL | Status: DC
  Administered 2011-07-26 – 2011-07-29 (×4): 237 mL via ORAL

## 2011-07-26 MED ORDER — ATORVASTATIN CALCIUM 10 MG PO TABS
10.0000 mg | ORAL_TABLET | Freq: Every day | ORAL | Status: DC
  Administered 2011-07-26 – 2011-07-28 (×3): 10 mg via ORAL
  Filled 2011-07-26 (×4): qty 1

## 2011-07-26 MED ORDER — DILTIAZEM HCL ER COATED BEADS 240 MG PO CP24
240.0000 mg | ORAL_CAPSULE | Freq: Every day | ORAL | Status: DC
  Administered 2011-07-26 – 2011-07-29 (×4): 240 mg via ORAL
  Filled 2011-07-26 (×4): qty 1

## 2011-07-26 NOTE — Progress Notes (Signed)
Attempted to give report for pt's transfer to unit 6700.  Awaiting for call back from receiving nurse. 07/26/2011 12:36 PM Ronald [name], Ronald [name]

## 2011-07-26 NOTE — Progress Notes (Signed)
CRITICAL VALUE ALERT  Critical value received:  INR 8.53  Date of notification:  07/26/2011   Time of notification: 10:58 AM   Critical value read back:yes  Nurse who received alert:  Maureen Ralphs , RN   MD notified (1st page):  Diane on call for DR. Timothy Lasso  Time of first page:  10:59 AM   MD notified (2nd page):  Time of second page:  Responding MD:  Sedalia Muta  Time MD responded:  11:02 AM

## 2011-07-26 NOTE — Progress Notes (Signed)
Utilization Review Completed.  Ronald [name]  07/26/2011  

## 2011-07-26 NOTE — Progress Notes (Addendum)
CRITICAL VALUE ALERT  Critical value received:  INR: 6.55  Date of notification:  07/26/2011  Time of notification:  0500  Critical value read back:yes  Nurse who received alert:  Fransisco Hertz, RN   MD notified (1st page):  Dr. Evlyn Kanner  Time of first page:  0500  MD notified (2nd page):  Time of second page:  Responding MD:  Dr. Evlyn Kanner  Time MD responded:  0502  Orders received to hold coumadin and to inform primary MD in am. Inform Dr. Evlyn Kanner of H/H. No new orders 0531: Pharmacy made aware of value and order.

## 2011-07-26 NOTE — Progress Notes (Addendum)
Subjective:  Looks much better today, less SOB  Objective:  Vital Signs in the last 24 hours: Temp:  [97.4 F (36.3 C)-98.9 F (37.2 C)] 97.7 F (36.5 C) (05/31 0838) Pulse Rate:  [75-132] 90  (05/31 0838) Resp:  [16-33] 22  (05/31 0838) BP: (122-160)/(55-88) 127/66 mmHg (05/31 0838) SpO2:  [92 %-99 %] 92 % (05/31 0838) Weight:  [80.2 kg (176 lb 12.9 oz)-81 kg (178 lb 9.2 oz)] 80.876 kg (178 lb 4.8 oz) (05/31 0015)  Intake/Output from previous day:  Intake/Output Summary (Last 24 hours) at 07/26/11 0907 Last data filed at 07/26/11 0839  Gross per 24 hour  Intake   1320 ml  Output   1454 ml  Net   -134 ml    Physical Exam: General appearance: alert, cooperative and no distress Lungs: fine crackles bilat Heart: irregularly irregular rhythm   Rate: 88  Rhythm: atrial fibrillation  Lab Results:  Basename 07/26/11 0409 07/25/11 0915  WBC 10.9* 19.1*  HGB 8.8* 9.0*  PLT 121* 123*    Basename 07/26/11 0409 07/25/11 0915  NA 134* 138  K 4.4 3.8  CL 99 101  CO2 26 25  GLUCOSE 333* 92  BUN 29* 25*  CREATININE 0.88 0.86   No results found for this basename: TROPONINI:2,CK,MB:2 in the last 72 hours Hepatic Function Panel  Basename 07/26/11 0409  PROT 6.1  ALBUMIN 2.1*  AST 44*  ALT 73*  ALKPHOS 159*  BILITOT 1.0  BILIDIR --  IBILI --   No results found for this basename: CHOL in the last 72 hours  Basename 07/26/11 0409  INR 6.55*    Imaging: Imaging results have been reviewed  Cardiac Studies:  Assessment/Plan:   Principal Problem:  *PNA (pneumonia) Active Problems:  Leukocytosis  Coumadin resumed one week ago for CAF, INR supratheraputic secondary to anibiotics  Persistent atrial fibrillation, rate elevated on adm  DIABETES MELLITUS, TYPE II  HYPERLIPIDEMIA  HYPERTENSION  PVD, Rt SFA PTA/HSRA 06/17/11  S/P aortic valve replacement: #23 Magna Ease Edwards Pericardial Valve  November 2012  Aspirin allergy, rash, SOB. Pt is on Plavix  Closed  right hip fracture, ORIF 06/27/11 complicated by gluteal hematoma while being Coumadinized post op  Normal coronary arteries, cath 11/12  Normal left ventricular systolic function, cath 11/12   Plan- Will follow, suspect we can transition back to po Diltiazem soon, MD to see.  Corine Shelter PA-C 07/26/2011, 9:07 AM  ATTENDING ATTESTATION:  Converted to PO Meds from IV Diltiazem - not quite @ home regimen yet to avoid hypotension.  Rate much better & BP stable. INR remains elevated - holding warfarin (Abx + warfarin leading to supratherapeutic levels).  As expected - HR will improve as PNA improved.  Having difficulty "clearing phlegm with cough" -- will order IS +/- flutter valve.  We will monitor his chart.  Provided his HR remains stable - will see again on Monday if he is still InPatient.  Marykay Lex, M.D., M.S. [facility] [address] [place], [place]  [address]  [phone] Pager # [phone]  07/26/2011 8:28 PM

## 2011-07-26 NOTE — Progress Notes (Signed)
ANTICOAGULATION CONSULT NOTE - Follow Up Consult ANTIBIOTIC CONSULT NOTE - Initial   Pharmacy Consult:  Coumadin / Vancomycin Indication:  Atrial fibrillation / Suspected PNA  Allergies  Allergen Reactions  . Aspirin Anaphylaxis, Shortness Of Breath and Rash    "broke out in white welts; red blotches neck and face; windpipe closing; ~ 1962"    Patient Measurements: Height: 5\' 11"  (180.3 cm) Weight: 178 lb 4.8 oz (80.876 kg) IBW/kg (Calculated) : 75.3   Vital Signs: Temp: 97.4 F (36.3 C) (05/31 0450) Temp src: Oral (05/31 0450) BP: 131/72 mmHg (05/31 0450) Pulse Rate: 79  (05/31 0450)  Labs:  Basename 07/26/11 0409 07/25/11 0915  HGB 8.8* 9.0*  HCT 26.3* 26.9*  PLT 121* 123*  APTT -- --  LABPROT 58.2* 38.9*  INR 6.55* 3.91*  HEPARINUNFRC -- --  CREATININE 0.88 0.86  CKTOTAL -- --  CKMB -- --  TROPONINI -- --    Estimated Creatinine Clearance: 63 ml/min (by C-G formula based on Cr of 0.88).     Assessment: 89 YOM c/o SOB, DOE, cough, sputum production and Afib with RVR.  Pharmacy consulted to manage Coumadin therapy for h/o Afib.  Noted that patient received Vit K 5mg  x 2 doses for elevated INR and it remains elevated today, no bleeding documented. MD to redraw INR at 10am and reassess the need for another dose of vitamin K.  Patient is also being treated for suspected HCAP and Pharmacy is managing vancomycin.  His renal function is stable.  WBC down to 10.1.  Goal of Therapy:  INR 2-3 Monitor platelets by anticoagulation protocol: Yes Vanc trough: 15-20 mcg/mL    Plan:  - Continue to hold Coumadin today - Daily PT / INR - Continue Vanc 1gm IV Q12H - Zosyn to 3.375gm IV Q8H (4 hr infusion) for CrCL > 20 ml/min - Monitor renal fxn, C/S, clinical status, vanc trough if indicated     Thuy D. Laney Potash, PharmD, BCPS Pager:  [phone] 07/26/2011, 7:43 AM

## 2011-07-26 NOTE — Progress Notes (Signed)
Pt to be transferred to unit 6700, report given to receiving nurse-Erin.  Pt and family educated on transfer and both verbalized their understanding of teaching.  Receiving nurse ok with pt having lunch before transport.   Pt stable to transport off monitor by then.

## 2011-07-26 NOTE — Progress Notes (Signed)
Subjective: Admitted yesterday with PNA and ?COPD exac. He looks so much better than yesterday. He is in AFib but now rate controlled on IV Cardizem gtt Rxed with IV Abx, nebs, FIO2 and steroids. Will wean IV Card gtt and get to floor today.  Objective: Vital signs in last 24 hours: Temp:  [97.4 F (36.3 C)-98.9 F (37.2 C)] 97.7 F (36.5 C) (05/31 0838) Pulse Rate:  [75-132] 90  (05/31 0838) Resp:  [16-33] 22  (05/31 0838) BP: (122-160)/(55-88) 127/66 mmHg (05/31 0838) SpO2:  [92 %-99 %] 92 % (05/31 0838) Weight:  [80.2 kg (176 lb 12.9 oz)-81 kg (178 lb 9.2 oz)] 80.876 kg (178 lb 4.8 oz) (05/31 0015) Weight change:  Last BM Date: 07/24/11 (Per pt)  CBG (last 3)   Basename 07/26/11 0808 07/25/11 2100 07/25/11 1657  GLUCAP 260* 246* 120*    Intake/Output from previous day:  Intake/Output Summary (Last 24 hours) at 07/26/11 0855 Last data filed at 07/26/11 0839  Gross per 24 hour  Intake   1320 ml  Output   1454 ml  Net   -134 ml   05/30 0701 - 05/31 0700 In: 1320 [P.O.:600; I.V.:420; IV Piggyback:300] Out: 1154 [Urine:1154]   Physical Exam General appearance: Alert and oriented sitting up in bed eating with much milder cough. Eyes: no scleral icterus Throat: oropharynx moist without erythema Resp: Distant less wheeze and rhonchi. Cardio: Irreg. GI: soft, non-tender; bowel sounds normal; no masses,  no organomegaly Extremities: no clubbing, cyanosis or edema   Lab Results:  [facility] 07/26/11 0409 07/25/11 0915  NA 134* 138  K 4.4 3.8  CL 99 101  CO2 26 25  GLUCOSE 333* 92  BUN 29* 25*  CREATININE 0.88 0.86  CALCIUM 8.9 9.0  MG -- --  PHOS -- --     Basename 07/26/11 0409 07/25/11 0915  AST 44* 46*  ALT 73* 68*  ALKPHOS 159* 143*  BILITOT 1.0 1.2  PROT 6.1 6.2  ALBUMIN 2.1* 2.2*     Basename 07/26/11 0409 07/25/11 0915  WBC 10.9* 19.1*  NEUTROABS -- 15.1*  HGB 8.8* 9.0*  HCT 26.3* 26.9*  MCV 90.4 90.0  PLT 121* 123*    Lab Results    Component Value Date   INR 6.55* 07/26/2011   INR 3.91* 07/25/2011   INR 1.23 07/02/2011    No results found for this basename: CKTOTAL:3,CKMB:3,CKMBINDEX:3,TROPONINI:3 in the last 72 hours  No results found for this basename: TSH,T4TOTAL,FREET3,T3FREE,THYROIDAB in the last 72 hours  No results found for this basename: VITAMINB12:2,FOLATE:2,FERRITIN:2,TIBC:2,IRON:2,RETICCTPCT:2 in the last 72 hours  Micro Results: Recent Results (from the past 240 hour(s))  MRSA PCR SCREENING     Status: Normal   Collection Time   07/25/11  3:30 PM      Component Value Range Status Comment   MRSA by PCR NEGATIVE  NEGATIVE  Final      Studies/Results: Dg Chest 2 View  07/25/2011  *RADIOLOGY REPORT*  Clinical Data: Shortness of breath, cough.  CHEST - 2 VIEW  Comparison: 06/29/2011  Findings: Prior median sternotomy and valve replacement.  Diffuse airspace disease throughout the left lung concerning for infection/pneumonia.  Right lung is clear.  Heart is borderline in size.  Small left pleural effusion.  Degenerative changes in the thoracic spine.  IMPRESSION: Diffuse left lung airspace disease concerning for pneumonia.  Small left effusion.  Original Report Authenticated By: Cyndie Chime, M.D.     Medications: Scheduled:   . sodium chloride  Intravenous Once  . antiseptic oral rinse  15 mL Mouth Rinse BID  . atorvastatin  10 mg Oral q1800  . benazepril  20 mg Oral Daily  . clopidogrel  75 mg Oral Q breakfast  . diltiazem  240 mg Oral STAT  . doxazosin  4 mg Oral QHS  . finasteride  5 mg Oral Daily  . Fluticasone-Salmeterol  1 puff Inhalation BID  . furosemide  20 mg Oral Daily  . glyBURIDE  10 mg Oral Q breakfast  . glyBURIDE  5 mg Oral QAC supper  . insulin aspart  0-15 Units Subcutaneous TID WC  . insulin glargine  20 Units Subcutaneous QHS  . ipratropium  0.5 mg Nebulization Q6H  . levalbuterol  0.63 mg Nebulization Q6H  . loratadine  10 mg Oral Daily  . methylPREDNISolone  (SOLU-MEDROL) injection  60 mg Intravenous Q12H  . metoprolol  50 mg Oral BID  . piperacillin-tazobactam (ZOSYN)  IV  3.375 g Intravenous Once  . piperacillin-tazobactam (ZOSYN)  IV  3.375 g Intravenous Q8H  . polyethylene glycol  17 g Oral BID  . vancomycin  1,500 mg Intravenous Once  . vancomycin  1,000 mg Intravenous Q12H  . Warfarin - Pharmacist Dosing Inpatient   Does not apply q1800  . DISCONTD: Fluticasone-Salmeterol  1 puff Inhalation BID  . DISCONTD: glyBURIDE  5-10 mg Oral BID WC  . DISCONTD: ipratropium  0.5 mg Nebulization Q6H  . DISCONTD: levalbuterol  0.63 mg Nebulization Q6H  . DISCONTD: simvastatin  20 mg Oral QHS   Continuous:   . sodium chloride 30 mL/hr at 07/25/11 1530  . diltiazem (CARDIZEM) infusion 10 mg/hr (07/26/11 0437)     Assessment/Plan: Principal Problem:  *PNA (pneumonia) Active Problems:  DIABETES MELLITUS, TYPE II  HYPERLIPIDEMIA  HYPERTENSION  PVD, Rt SFA PTA/HSRA 06/17/11  S/P aortic valve replacement: #23 Magna Ease Edwards Pericardial Valve  November 2012  Leukocytosis  Aspirin allergy, rash, SOB. Pt is on Plavix  Coumadin resumed one week ago for CAF, INR supratheraputic secondary to anibiotics  Persistent atrial fibrillation, rate elevated on adm  Closed right hip fracture, ORIF 06/27/11 complicated by gluteal hematoma while being Coumadinized post op  Normal coronary arteries, cath 11/12  Normal left ventricular systolic function, cath 11/12   PNA - poss Hosp Acquired in this vasculopathic male with COPD who still smokes. Continue IV Abx but wean to orals over the weekend, O2, Nebs, pulmonary toilet and IV - oral steroids.  Full code.  significant vasculopathy - continue plavix,  AFib with RVR.  Dr Allyson Sabal. Wean gtt to orals and transfer to floor. Cardizem 360 and Lopressor 50 BID is outpt regimen. HD stable and HRs in 80-90's. INR 6+ - per pharm. Coumadin on hold and repeat labs later today. AV stenosis S/P Bioprosthetic aortic valve  replacement in November 2012,  COPD (still smoking) - Steroids, continue Advair inhaler. nebulizer treatment = xopenex to avoid overstimulating his HR and pulmonary toilet,  S/P traumatic fall 06/26/11 and R Hip fracture S/P ORIF complicated by R Thigh hematoma. D/ced to [facility] 07/02/11.   Will order PT and CW.  He is excited to go back to [place] on Sunday/Monday to complete their program prior to returning home. HTN- fine. Tobacco abuse - needs to quit  Anemia with hbg 9.0 to 8.8 - stable and recent Post op Blood Loss anemia S/P a total of 4 PRBCs due the the large hematoma during last admission.  He is Type and Screened Peripheral  vascular disease S/P recent PTCA - on Plavix , complicated by smoking, followed by Dr. Allyson Sabal  Osteoporosis - will get DEXA and be evaluated for meds as an outpt.   DM2 with complications- ISS, last hemoglobin A1c 8.1% complicated by vascular disease and potential neuropathy. Lantus /ISS. Glyburide.    Basename 07/26/11 0808 07/25/11 2100 07/25/11 1657  GLUCAP 260* 246* 120*   ID -  Anti-infectives     Start     Dose/Rate Route Frequency Ordered Stop   07/26/11 0000   vancomycin (VANCOCIN) IVPB 1000 mg/200 mL premix        1,000 mg 200 mL/hr over 60 Minutes Intravenous Every 12 hours 07/25/11 1458     07/25/11 2000  piperacillin-tazobactam (ZOSYN) IVPB 3.375 g       3.375 g 12.5 mL/hr over 240 Minutes Intravenous 3 times per day 07/25/11 1438     07/25/11 1100   vancomycin (VANCOCIN) 1,500 mg in sodium chloride 0.9 % 500 mL IVPB        1,500 mg 250 mL/hr over 120 Minutes Intravenous  Once 07/25/11 1011 07/25/11 1414   07/25/11 1015  piperacillin-tazobactam (ZOSYN) IVPB 3.375 g       3.375 g 12.5 mL/hr over 240 Minutes Intravenous  Once 07/25/11 1011 07/25/11 1159         DVT Prophylaxis  Plan for today is restart PT, wean IV steroids to oral, Wean IV Cardizem gtt to oral, then transfer to floor.    LOS: 1 day   Cambrie Sonnenfeld M 07/26/2011,  8:55 AM

## 2011-07-26 NOTE — Progress Notes (Signed)
INITIAL ADULT NUTRITION ASSESSMENT Date: 07/26/2011   Time: 10:06 AM Reason for Assessment: Nutrition Risk  ASSESSMENT: Male 78 y.o.  Dx: PNA (pneumonia)  Hx:  Past Medical History  Diagnosis Date  . AS (aortic stenosis)   . HTN (hypertension)   . Heart murmur   . Thrombocytopenia due to drugs     seen by [name] plts 114000 no rx  . Peripheral vascular disease very poor circulation legs and feet ... stents right and left legs... done in [name] 's office.   . Depression wife died 4 years ago.    Marland Kitchen COPD (chronic obstructive pulmonary disease)   . Shortness of breath   . Recurrent upper respiratory infection (URI)     sinusitis  . Claudication in peripheral vascular disease 06/17/2011  . S/P angioplasty with stent, diamond back rotational athrectomy Prox. Rt. SFA 06/17/2011 06/17/2011  . Neuropathy due to secondary diabetes   . Type II diabetes mellitus   . Pneumonia 07/25/11    left  . Dysrhythmia 5-7 weeks ago ... sees dr Timothy Lasso    atrial fibrilation  . Bundle branch block, left   . Rapid atrial fibrillation 07/25/11  . Blood transfusion     w/hip operation  . History of stomach ulcers ~ 1951  . Bladder cancer     Bladder Cancer local  . Chronic kidney disease     renal artery stent    Related Meds:     . antiseptic oral rinse  15 mL Mouth Rinse BID  . atorvastatin  10 mg Oral q1800  . benazepril  20 mg Oral Daily  . clopidogrel  75 mg Oral Q breakfast  . diltiazem  240 mg Oral STAT  . diltiazem  240 mg Oral Daily  . diltiazem  30 mg Oral Once  . doxazosin  4 mg Oral QHS  . finasteride  5 mg Oral Daily  . Fluticasone-Salmeterol  1 puff Inhalation BID  . furosemide  20 mg Oral Daily  . glyBURIDE  10 mg Oral Q breakfast  . glyBURIDE  5 mg Oral QAC supper  . insulin aspart  0-15 Units Subcutaneous TID WC  . insulin glargine  20 Units Subcutaneous QHS  . ipratropium  0.5 mg Nebulization Q6H  . levalbuterol  0.63 mg Nebulization Q6H  . loratadine  10 mg  Oral Daily  . methylPREDNISolone (SOLU-MEDROL) injection  60 mg Intravenous Q12H  . metoprolol  50 mg Oral BID  . piperacillin-tazobactam (ZOSYN)  IV  3.375 g Intravenous Once  . piperacillin-tazobactam (ZOSYN)  IV  3.375 g Intravenous Q8H  . polyethylene glycol  17 g Oral BID  . vancomycin  1,500 mg Intravenous Once  . vancomycin  1,000 mg Intravenous Q12H  . Warfarin - Pharmacist Dosing Inpatient   Does not apply q1800  . DISCONTD: Fluticasone-Salmeterol  1 puff Inhalation BID  . DISCONTD: glyBURIDE  5-10 mg Oral BID WC  . DISCONTD: ipratropium  0.5 mg Nebulization Q6H  . DISCONTD: levalbuterol  0.63 mg Nebulization Q6H  . DISCONTD: simvastatin  20 mg Oral QHS     Ht: 5\' 11"  (180.3 cm)  Wt: 178 lb 4.8 oz (80.876 kg)  Ideal Wt:78.2 kg % Ideal Wt: 103%  Usual Wt: 215 lb over 1 year ago, per weight hx pt weight appears to have been stable around 182 lbs for several months.  Wt Readings from Last 20 Encounters:  07/26/11 178 lb 4.8 oz (80.876 kg)  06/26/11 178 lb 9.2  oz (81 kg)  06/26/11 178 lb 9.2 oz (81 kg)  06/18/11 182 lb 1.6 oz (82.6 kg)  06/18/11 182 lb 1.6 oz (82.6 kg)  04/04/11 182 lb (82.555 kg)  04/03/11 180 lb (81.647 kg)  04/03/11 180 lb (81.647 kg)  03/12/11 184 lb (83.462 kg)  02/07/11 182 lb 12.2 oz (82.9 kg)  01/31/11 174 lb (78.926 kg)  01/15/11 192 lb 3.2 oz (87.181 kg)  01/15/11 192 lb 3.2 oz (87.181 kg)  12/18/10 180 lb (81.647 kg)  12/14/10 180 lb (81.647 kg)  05/01/09 197 lb 4 oz (89.472 kg)  10/20/08 191 lb (86.637 kg)  10/12/08 196 lb (88.905 kg)  06/01/08 191 lb (86.637 kg)  05/30/08 185 lb (83.915 kg)     % Usual Wt: 97.8%  Body mass index is 24.87 kg/(m^2). WNL  Food/Nutrition Related Hx: Pt reports decreased appetite over the last 1-2 months.   Labs:  CMP     Component Value Date/Time   NA 134* 07/26/2011 0409   K 4.4 07/26/2011 0409   CL 99 07/26/2011 0409   CO2 26 07/26/2011 0409   GLUCOSE 333* 07/26/2011 0409   BUN 29* 07/26/2011  0409   CREATININE 0.88 07/26/2011 0409   CALCIUM 8.9 07/26/2011 0409   PROT 6.1 07/26/2011 0409   ALBUMIN 2.1* 07/26/2011 0409   AST 44* 07/26/2011 0409   ALT 73* 07/26/2011 0409   ALKPHOS 159* 07/26/2011 0409   BILITOT 1.0 07/26/2011 0409   GFRNONAA 75* 07/26/2011 0409   GFRAA 87* 07/26/2011 0409   CBG (last 3)   Basename 07/26/11 0808 07/25/11 2100 07/25/11 1657  GLUCAP 260* 246* 120*   Lab Results  Component Value Date   HGBA1C 8.0* 01/03/2011     Intake/Output Summary (Last 24 hours) at 07/26/11 1010 Last data filed at 07/26/11 1000  Gross per 24 hour  Intake   1710 ml  Output   1450 ml  Net    260 ml     Diet Order: Carb Control  Supplements/Tube Feeding: none  IVF:    sodium chloride Last Rate: 30 mL/hr at 07/25/11 1530  diltiazem (CARDIZEM) infusion   DISCONTD: diltiazem (CARDIZEM) infusion Last Rate: 10 mg/hr (07/26/11 0437)    Estimated Nutritional Needs:   Kcal: 1800-2000 Protein: 90-100 gm  Fluid:  1.8-2 L  Pt reports he has had decreasing weight in the rehab facility. HE has been trying to gain weight by being less restrictive in his meals and by having his children bring in food. Pt reports his UBW to be 215 lbs, but unable to find documentation of this. PT weight appears to be fairly stable recently. Pt has had 6 lb weight loss (3.2%) in 5 months, not significant.  Pt was a resident of a rehab facility after a hip fracture.  RD offered nutrition supplements to pt, agreeable to Ensure Complete daily. No meals have been documented for this admission.    NUTRITION DIAGNOSIS: -Predicted suboptimal nutrient intake (NI-5.11.1).  Status: Ongoing  RELATED TO: current illness, work of breathing  AS EVIDENCE BY: pt report of decreased appetite  MONITORING/EVALUATION(Goals): Goal: PO intake of meals and supplements to meet >90% of estimated nutrition needs Monitor: PO intake, weight, labs, I/O's  EDUCATION NEEDS: -No education needs identified at this  time  INTERVENTION: 1. RD encouraged pt to order meals to increase his intake 2. RD will add Ensure Complete daily. 3. RD will continue to follow  Dietitian [phone]  DOCUMENTATION CODES Per approved criteria  -Not  Applicable    Clarene Duke MARIE 07/26/2011, 10:06 AM

## 2011-07-26 NOTE — Progress Notes (Signed)
eLink Physician-Brief Progress Note Patient Name: Ronald [name] DOB: 01-Oct-1922 MRN: [id number]  Date of Service  07/26/2011   HPI/Events of Note   Note rise in INR with coumadin on hold. INR 6.55 this am  eICU Interventions  Recheck at 10:00, may need either Vit K or FFP if still rising at that time. If bleeding evolves will give sooner   Intervention Category Intermediate Interventions: Other:  Mayce Noyes S. 07/26/2011, 5:16 AM

## 2011-07-26 NOTE — Progress Notes (Signed)
Pt transferred from 2600 and was placed on telemetry. Pt HOH but A/Ox4. Pt has red raised areas that look like blood  blisters on his chest. Pt has a scar from his ORIF on his right hip, incision is healed and intact. Will continue to monitor.

## 2011-07-27 ENCOUNTER — Inpatient Hospital Stay (HOSPITAL_COMMUNITY): Payer: Medicare Other

## 2011-07-27 LAB — PROTIME-INR: Prothrombin Time: 33.8 seconds — ABNORMAL HIGH (ref 11.6–15.2)

## 2011-07-27 LAB — GLUCOSE, CAPILLARY: Glucose-Capillary: 263 mg/dL — ABNORMAL HIGH (ref 70–99)

## 2011-07-27 MED ORDER — INSULIN GLARGINE 100 UNIT/ML ~~LOC~~ SOLN
30.0000 [IU] | Freq: Every day | SUBCUTANEOUS | Status: DC
Start: 2011-07-27 — End: 2011-07-28
  Administered 2011-07-27: 30 [IU] via SUBCUTANEOUS

## 2011-07-27 NOTE — Progress Notes (Signed)
ANTICOAGULATION CONSULT NOTE - Follow Up Consult  Pharmacy Consult for  Coumadin Indication: atrial fibrillation  Allergies  Allergen Reactions  . Aspirin Anaphylaxis, Shortness Of Breath and Rash    "broke out in white welts; red blotches neck and face; windpipe closing; ~ 1962"    Patient Measurements: Height: 5\' 11"  (180.3 cm) Weight: 183 lb 13.8 oz (83.4 kg) IBW/kg (Calculated) : 75.3  Heparin Dosing Weight: n/a  Vital Signs: Temp: 97.2 F (36.2 C) (06/01 0940) Temp src: Oral (06/01 0940) BP: 120/69 mmHg (06/01 0940) Pulse Rate: 84  (06/01 0940)  Labs:  Basename 07/27/11 0530 07/26/11 1008 07/26/11 0409 07/25/11 0915  HGB -- -- 8.8* 9.0*  HCT -- -- 26.3* 26.9*  PLT -- -- 121* 123*  APTT -- -- -- --  LABPROT 33.8* 71.5* 58.2* --  INR 3.27* 8.53* 6.55* --  HEPARINUNFRC -- -- -- --  CREATININE -- -- 0.88 0.86  CKTOTAL -- -- -- --  CKMB -- -- -- --  TROPONINI -- -- -- --    Estimated Creatinine Clearance: 63 ml/min (by C-G formula based on Cr of 0.88).   Assessment: 78 yo male started on Coumadin ~ 1 week ago.  Presented with elevated INR due to antibiotics.  No bleeding noted.  Received vitamin K prior to admission, and then 1 dose of 5 mg vitamin k as inpatient yesterday.  INR now 3.27.  Goal of Therapy:  INR 2-3 Monitor platelets by anticoagulation protocol: Yes   Plan:  1. Continue to hold Coumadin tonight.  Can likely resume at lower dose tomorrow. 2. Continue daily INR.  Rosibel Giacobbe C 07/27/2011,11:20 AM

## 2011-07-27 NOTE — Evaluation (Signed)
Physical Therapy Evaluation Patient Details Name: Ronald [name] MRN: [id number] DOB: [birth year] Today's Date: 07/27/2011 Time: 1191-4782 PT Time Calculation (min): 28 min  PT Assessment / Plan / Recommendation Clinical Impression  Pt. was admitted with HCAP and had recently suffered a right hip fx. with IM nail.  He was transferred here from [facility] where he was receiving rehab.  Pt.conveys he was about ready to DC home when getting sick and having to come to the hospital.  Pt. continues to have decreased safety in gait and balance.  I am concerned that he is not quite ready to Dc directly home from hospital UNLESS he progresses significantly while here.  He desires to go straight home.  Believe we need to plan on return to [facility] before home.    PT Assessment  Patient needs continued PT services    Follow Up Recommendations  Skilled nursing facility    Barriers to Discharge Decreased caregiver support      lEquipment Recommendations  Defer to next venue    Recommendations for Other Services     Frequency Min 3X/week    Precautions / Restrictions Precautions Precautions: Fall Restrictions Weight Bearing Restrictions: Yes RLE Weight Bearing: Weight bearing as tolerated (as per last admission orders for WB status)   Pertinent Vitals/Pain Dyspnea 3/4 during walk but sats remained in low 90's throughout (on RA)      Mobility  Bed Mobility Bed Mobility: Supine to Sit;Sit to Supine Supine to Sit: 4: Min guard;With rails;HOB elevated Sit to Supine: 4: Min guard;With rail;HOB elevated Details for Bed Mobility Assistance: pt. needed extra time and with increased effort for bed mobility Transfers Transfers: Sit to Stand;Stand to Sit Sit to Stand: 4: Min guard;From bed;With upper extremity assist Stand to Sit: 4: Min guard;With upper extremity assist;To bed Details for Transfer Assistance: cues for safe hand placement on sit to stand, initial  unsteadiness Ambulation/Gait Ambulation/Gait Assistance: 4: Min assist;3: Mod assist;Other (comment) (one LOB needing mod assist to correct) Ambulation Distance (Feet): 60 Feet Assistive device: Rolling walker Ambulation/Gait Assistance Details: Pt. able to place right LE flat on floor for WB (had indicatd he had been having difficulty doing so).  Pt ambulates with right LE external rotation, but no shortening noted and no increased pain with WB.  Pt. had one significant LOB requiring mod assist to correct . Gait Pattern: Step-through pattern;Decreased hip/knee flexion - right;Decreased dorsiflexion - right    Exercises     PT Diagnosis: Difficulty walking  PT Problem List: Decreased activity tolerance;Decreased balance;Decreased knowledge of use of DME;Pain;Cardiopulmonary status limiting activity PT Treatment Interventions: DME instruction;Gait training;Functional mobility training;Therapeutic activities;Therapeutic exercise;Balance training;Patient/family education   PT Goals Acute Rehab PT Goals PT Goal Formulation: With patient Time For Goal Achievement: 08/03/11 Potential to Achieve Goals: Good Pt will go Supine/Side to Sit: with modified independence PT Goal: Supine/Side to Sit - Progress: Goal set today Pt will go Sit to Supine/Side: with modified independence PT Goal: Sit to Supine/Side - Progress: Goal set today Pt will go Sit to Stand: with supervision PT Goal: Sit to Stand - Progress: Goal set today Pt will go Stand to Sit: with supervision PT Goal: Stand to Sit - Progress: Goal set today Pt will Ambulate: 51 - 150 feet;with supervision;with rolling walker PT Goal: Ambulate - Progress: Goal set today Pt will Perform Home Exercise Program: Independently PT Goal: Perform Home Exercise Program - Progress: Goal set today  Visit Information  Last PT Received On: 07/27/11 Assistance Needed: +1  Subjective Data  Subjective: I [facility] to go back to [facility] Patient Stated  Goal: Get home and get back to work   Prior Functioning  Home Living Lives With: Alone Available Help at Discharge: Skilled Nursing Facility Type of Home: Skilled Nursing Facility Prior Function Level of Independence: Needs assistance Needs Assistance: Gait;Transfers Gait Assistance: one asist with RW Transfer Assistance: one assist Driving: Yes 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 sleeping but awakens with questioninf.  Pt reports he did not sleep much last night.    Extremity/Trunk Assessment Right Upper Extremity Assessment RUE ROM/Strength/Tone: WFL for tasks assessed Left Upper Extremity Assessment LUE ROM/Strength/Tone: WFL for tasks assessed Right Lower Extremity Assessment RLE ROM/Strength/Tone: WFL for tasks assessed RLE Sensation: WFL - Light Touch RLE Coordination: WFL - gross/fine motor Left Lower Extremity Assessment LLE ROM/Strength/Tone: WFL for tasks assessed LLE Sensation: WFL - Light Touch LLE Coordination: WFL - gross/fine motor Trunk Assessment Trunk Assessment: Normal   Balance    End of Session PT - End of Session Equipment Utilized During Treatment: Gait belt Activity Tolerance: Patient tolerated treatment well Patient left: in bed;with call bell/phone within reach;Other (comment) (at pt's request and due to "not sleeping much last night") Nurse Communication: Mobility status;Other (comment) (instructed RN Lissa Hoard that pt. should not be up alone)   [name] 07/27/2011, 10:47 AM Acute Rehabilitation Services [phone] (pager)

## 2011-07-27 NOTE — Progress Notes (Signed)
Subjective: Patient looks relaxed calm no distress. He relates he is breathing much better. His cardiac issues have settled down nicely. He continues on his IV antibiotics and pulmonary toilet. He is mobilized very little. Diet is been excellent.  Objective: Vital signs in last 24 hours: Temp:  [97.2 F (36.2 C)-98.2 F (36.8 C)] 97.2 F (36.2 C) (06/01 0940) Pulse Rate:  [54-84] 84  (06/01 0940) Resp:  [18-23] 20  (06/01 0940) BP: (117-142)/(51-69) 120/69 mmHg (06/01 0940) SpO2:  [91 %-97 %] 93 % (06/01 0940) Weight:  [83.4 kg (183 lb 13.8 oz)] 83.4 kg (183 lb 13.8 oz) (05/31 2017) Weight change: 2.4 kg (5 lb 4.7 oz)  CBG (last 3)   Basename 07/27/11 0750 07/26/11 2020 07/26/11 1638  GLUCAP 263* 253* 286*    Intake/Output from previous day: 05/31 0701 - 06/01 0700 In: 1570 [P.O.:360; I.V.:710; IV Piggyback:500] Out: 1400 [Urine:1400]  Physical Exam:   General appearance: Alert and oriented sitting up in bed eating with much milder cough.  Eyes: no scleral icterus  Throat: oropharynx moist without erythema  Resp: Distant less wheeze and rhonchi.  Cardio: Irreg.  GI: soft, non-tender; bowel sounds normal; no masses, no organomegaly  Extremities: no clubbing, cyanosis or edema     Lab Results:  [facility] 07/26/11 0409 07/25/11 0915  NA 134* 138  K 4.4 3.8  CL 99 101  CO2 26 25  GLUCOSE 333* 92  BUN 29* 25*  CREATININE 0.88 0.86  CALCIUM 8.9 9.0  MG -- --  PHOS -- --    Basename 07/26/11 0409 07/25/11 0915  AST 44* 46*  ALT 73* 68*  ALKPHOS 159* 143*  BILITOT 1.0 1.2  PROT 6.1 6.2  ALBUMIN 2.1* 2.2*    Basename 07/26/11 0409 07/25/11 0915  WBC 10.9* 19.1*  NEUTROABS -- 15.1*  HGB 8.8* 9.0*  HCT 26.3* 26.9*  MCV 90.4 90.0  PLT 121* 123*   Lab Results  Component Value Date   INR 3.27* 07/27/2011   INR 8.53* 07/26/2011   INR 6.55* 07/26/2011   No results found for this basename: CKTOTAL:3,CKMB:3,CKMBINDEX:3,TROPONINI:3 in the last 72 hours No  results found for this basename: TSH,T4TOTAL,FREET3,T3FREE,THYROIDAB in the last 72 hours No results found for this basename: VITAMINB12:2,FOLATE:2,FERRITIN:2,TIBC:2,IRON:2,RETICCTPCT:2 in the last 72 hours  Studies/Results: Dg Chest 2 View  07/27/2011  *RADIOLOGY REPORT*  Clinical Data: Pneumonia.  Shortness of breath.  CHEST - 2 VIEW  Comparison: Plain film chest 07/25/2011.  Findings: Airspace disease throughout the left chest persists but shows some improvement.  Right lung is clear.  There is no pneumothorax.  Cardiomegaly is noted.  Left pleural effusion is identified.  The patient is status post aortic valve replacement with an atrial appendage closure device again seen.  IMPRESSION: Some improvement in diffuse left side airspace disease, left pleural effusion noted.  Original Report Authenticated By: Bernadene Bell. Maricela Curet, M.D.     Assessment/Plan: #1 pneumonia currently on antibiotics with nice response exam much improved   #2 COPD exacerbation with continued tobacco dependency we continue bronchodilators antibiotics clearing nicely  #3 atrial fibrillation with rapid ventricular response currently transition back to his baseline regimen on Coumadin  #4 diabetes mellitus type 2 still not adequate we controlled will adjust meds and insulin  #5 essential hypertension stable   LOS: 2 days   Olis Viverette A 07/27/2011, 11:49 AM

## 2011-07-27 NOTE — Progress Notes (Addendum)
Clinical Social Work Department BRIEF PSYCHOSOCIAL ASSESSMENT 07/27/2011  Patient:  Ronald [name], Ronald [name]     Account Number:  [id number]     Admit date:  07/25/2011  Clinical Social Worker:  Juliette Mangle  Date/Time:  07/27/2011 04:45 PM  Referred by:  Physician  Date Referred:  07/26/2011 Referred for  SNF Placement   Other Referral:   Interview type:  Patient Other interview type:    PSYCHOSOCIAL DATA Living Status:  ALONE Admitted from facility:  [facility] Level of care:  Skilled Nursing Facility Primary support name:  Farnan,[name] Primary support relationship to patient:  CHILD, ADULT Degree of support available:   Very Good    CURRENT CONCERNS Current Concerns  Post-Acute Placement   Other Concerns:    SOCIAL WORK ASSESSMENT / PLAN CSW received referral that patient is from [facility] and would like to return. CSW met with patient, introduced self, explained role and discussed returning to SNF. Patient is agreeable to this.  CSW will assist with all d/c needs when patient is medically stable for d/c   Assessment/plan status:  Psychosocial Support/Ongoing Assessment of Needs Other assessment/ plan:   Information/referral to community resources:    PATIENT'S/FAMILY'S RESPONSE TO PLAN OF CARE: Patient was very receptive and appreciative of support and information provided by CSW. CSW will continue to follow and assist with all needs.  FL2, and clinicals sent to [facility], MSW, Theresia Majors [phone] Weekend coverage

## 2011-07-27 NOTE — Progress Notes (Signed)
Physical Therapy Treatment Patient Details Name: Ronald [name] MRN: [id number] DOB: 1923-01-06 Today's Date: 07/27/2011 Time: 4098-1191 PT Time Calculation (min): 30 min  PT Assessment / Plan / Recommendation Comments on Treatment Session  Pt. continues to have safety issues which indicate he will likely not be ready to DC directly home and will need return to [facility].    Follow Up Recommendations  Skilled nursing facility    Barriers to Discharge        Equipment Recommendations  Defer to next venue    Recommendations for Other Services    Frequency Min 3X/week   Plan Discharge plan remains appropriate    Precautions / Restrictions Precautions Precautions: Fall Restrictions Weight Bearing Restrictions: Yes RLE Weight Bearing: Weight bearing as tolerated   Pertinent Vitals/Pain sats in mid 90's on room air throughout session.  Rates right hip pain as zero.    Mobility  Bed Mobility Bed Mobility: Supine to Sit;Sit to Supine Supine to Sit: 4: Min guard;With rails;HOB elevated Sit to Supine: 4: Min guard;With rail;HOB elevated Details for Bed Mobility Assistance: using rails heavily to assist self Transfers Transfers: Sit to Stand;Stand to Sit Sit to Stand: 4: Min guard;From bed;With upper extremity assist Stand to Sit: 4: Min guard;With upper extremity assist;To bed Details for Transfer Assistance: safety cues  Ambulation/Gait Ambulation/Gait Assistance: 4: Min assist;3: Mod assist;Other (comment) Ambulation Distance (Feet): 40 Feet Assistive device: Rolling walker Ambulation/Gait Assistance Details: Pt. unsafe at times with Rw due to leaving it behind to reach for bed , etc. He had no significant LOB like this am but was overall somewhat unsteady and aaccidentally urinated in the floor while standing at the toilet.   Gait Pattern: Step-through pattern;Decreased hip/knee flexion - right;Decreased dorsiflexion - right    Exercises     PT Diagnosis:    PT Problem  List:   PT Treatment Interventions:     PT Goals Acute Rehab PT Goals PT Goal: Supine/Side to Sit - Progress: Progressing toward goal PT Goal: Sit to Supine/Side - Progress: Progressing toward goal PT Goal: Sit to Stand - Progress: Progressing toward goal PT Goal: Stand to Sit - Progress: Progressing toward goal PT Goal: Ambulate - Progress: Progressing toward goal  Visit Information  Last PT Received On: 07/27/11 Assistance Needed: +1    Subjective Data  Subjective: I want to stop by the bathroom   Cognition  Overall Cognitive Status: Appears within functional limits for tasks assessed/performed Arousal/Alertness: Awake/alert Orientation Level: Oriented X4 / Intact Behavior During Session: [facility] for tasks performed    Balance     End of Session PT - End of Session Equipment Utilized During Treatment: Gait belt Activity Tolerance: Patient tolerated treatment well;Patient limited by fatigue;Other (comment) (dyspnea aagain 3/4 with activity athough sats in mid 90's) Patient left: in bed;with call bell/phone within reach;with bed alarm set Nurse Communication: Mobility status;Other (comment)    Ronald [name] 07/27/2011, 3:50 PM Acute Rehabilitation Services [phone] (pager)

## 2011-07-28 LAB — GLUCOSE, CAPILLARY
Glucose-Capillary: 216 mg/dL — ABNORMAL HIGH (ref 70–99)
Glucose-Capillary: 268 mg/dL — ABNORMAL HIGH (ref 70–99)

## 2011-07-28 LAB — PROTIME-INR: INR: 2.79 — ABNORMAL HIGH (ref 0.00–1.49)

## 2011-07-28 MED ORDER — INSULIN GLARGINE 100 UNIT/ML ~~LOC~~ SOLN
35.0000 [IU] | Freq: Every day | SUBCUTANEOUS | Status: DC
  Administered 2011-07-28: 35 [IU] via SUBCUTANEOUS

## 2011-07-28 MED ORDER — WARFARIN SODIUM 1 MG PO TABS
1.0000 mg | ORAL_TABLET | Freq: Once | ORAL | Status: AC
  Administered 2011-07-28: 1 mg via ORAL
  Filled 2011-07-28: qty 1

## 2011-07-28 NOTE — Progress Notes (Signed)
Subjective: Patient is feeling well today his only complaint is the sheet in the room and was asked nursing to adjusted. He had a good breakfast. He's been breathing well. He's had nominal cough.  Objective: Vital signs in last 24 hours: Temp:  [97.6 F (36.4 C)-98.6 F (37 C)] 97.8 F (36.6 C) (06/02 0900) Pulse Rate:  [75-91] 80  (06/02 0900) Resp:  [18-19] 19  (06/02 0900) BP: (125-157)/(59-83) 130/59 mmHg (06/02 0900) SpO2:  [94 %-98 %] 98 % (06/02 0900) Weight:  [83.6 kg (184 lb 4.9 oz)] 83.6 kg (184 lb 4.9 oz) (06/01 2157) Weight change: 0.2 kg (7.1 oz)  CBG (last 3)   Basename 07/28/11 0742 07/27/11 2155 07/27/11 1643  GLUCAP 254* 260* 323*    Intake/Output from previous day: 06/01 0701 - 06/02 0700 In: 1080 [P.O.:1080] Out: 3276 [Urine:3275; Stool:1]  Physical Exam: Patient is awake alert supine in bed in no distress. Good facial symmetry. No JVD or bruits. Lungs diminished breath sounds at the bases but no rhonchi or rales or wheezing. Abdomen is soft nontender good bowel sounds. Extremities with intact pulses no peripheral edema. Neurologically higher cortical functioning is intact he is alert nonlateralizing.   Lab Results:  [facility] 07/26/11 0409  NA 134*  K 4.4  CL 99  CO2 26  GLUCOSE 333*  BUN 29*  CREATININE 0.88  CALCIUM 8.9  MG --  PHOS --    Basename 07/26/11 0409  AST 44*  ALT 73*  ALKPHOS 159*  BILITOT 1.0  PROT 6.1  ALBUMIN 2.1*    Basename 07/26/11 0409  WBC 10.9*  NEUTROABS --  HGB 8.8*  HCT 26.3*  MCV 90.4  PLT 121*   Lab Results  Component Value Date   INR 2.79* 07/28/2011   INR 3.27* 07/27/2011   INR 8.53* 07/26/2011   No results found for this basename: CKTOTAL:3,CKMB:3,CKMBINDEX:3,TROPONINI:3 in the last 72 hours No results found for this basename: TSH,T4TOTAL,FREET3,T3FREE,THYROIDAB in the last 72 hours No results found for this basename: VITAMINB12:2,FOLATE:2,FERRITIN:2,TIBC:2,IRON:2,RETICCTPCT:2 in the last 72  hours  Studies/Results: Dg Chest 2 View  07/27/2011  *RADIOLOGY REPORT*  Clinical Data: Pneumonia.  Shortness of breath.  CHEST - 2 VIEW  Comparison: Plain film chest 07/25/2011.  Findings: Airspace disease throughout the left chest persists but shows some improvement.  Right lung is clear.  There is no pneumothorax.  Cardiomegaly is noted.  Left pleural effusion is identified.  The patient is status post aortic valve replacement with an atrial appendage closure device again seen.  IMPRESSION: Some improvement in diffuse left side airspace disease, left pleural effusion noted.  Original Report Authenticated By: Bernadene Bell. Maricela Curet, M.D.     Assessment/Plan: #1 pneumonia clinically he and exam stable radiographically yesterday's film is noted we'll continue current antibiotics he's had a nice response  #2 COPD exacerbation with tobacco dependency currently stable  #3 atrial fibrillation rate currently controlled on orals and he continues on his Coumadin  #4 diabetes mellitus type 2 insulin to be adjusted again today  #5 essential hypertension stable   LOS: 3 days   Taivon Haroon A 07/28/2011, 10:38 AM

## 2011-07-28 NOTE — Progress Notes (Addendum)
ANTICOAGULATION CONSULT NOTE - Follow Up Consult  Pharmacy Consult for  Coumadin Indication: atrial fibrillation  Allergies  Allergen Reactions  . Aspirin Anaphylaxis, Shortness Of Breath and Rash    "broke out in white welts; red blotches neck and face; windpipe closing; ~ 1962"    Patient Measurements: Height: 5\' 11"  (180.3 cm) Weight: 184 lb 4.9 oz (83.6 kg) IBW/kg (Calculated) : 75.3  Heparin Dosing Weight: n/a  Vital Signs: Temp: 97.8 F (36.6 C) (06/02 0900) Temp src: Oral (06/02 0900) BP: 130/59 mmHg (06/02 0900) Pulse Rate: 80  (06/02 0900)  Labs:  Basename 07/28/11 0542 07/27/11 0530 07/26/11 1008 07/26/11 0409  HGB -- -- -- 8.8*  HCT -- -- -- 26.3*  PLT -- -- -- 121*  APTT -- -- -- --  LABPROT 29.9* 33.8* 71.5* --  INR 2.79* 3.27* 8.53* --  HEPARINUNFRC -- -- -- --  CREATININE -- -- -- 0.88  CKTOTAL -- -- -- --  CKMB -- -- -- --  TROPONINI -- -- -- --    Estimated Creatinine Clearance: 63 ml/min (by C-G formula based on Cr of 0.88).   Assessment: 78 yo male on chronic Coumadin for afib.  Presented with elevated INR due to antibiotics.  No bleeding noted.  Received vitamin K prior to admission, and then 1 dose of 5 mg vitamin k as inpatient yesterday.  INR now 2.79.  Spoke with his daughter Fann today, before addition of antibiotics, pt was on Coumadin 2.5 mg daily.  Will resume Coumadin today now that INR back in therapeutic range.  Dose a little more cautiously since he remains on antibiotics.  Goal of Therapy:  INR 2-3 Monitor platelets by anticoagulation protocol: Yes   Plan:  1. Coumadin 1 mg po x 1. 2. Continue daily INR.  Versa Craton, Gwenlyn Found 07/28/2011,12:56 PM

## 2011-07-29 DIAGNOSIS — E119 Type 2 diabetes mellitus without complications: Secondary | ICD-10-CM | POA: Diagnosis not present

## 2011-07-29 DIAGNOSIS — M129 Arthropathy, unspecified: Secondary | ICD-10-CM | POA: Diagnosis not present

## 2011-07-29 DIAGNOSIS — D649 Anemia, unspecified: Secondary | ICD-10-CM | POA: Diagnosis not present

## 2011-07-29 DIAGNOSIS — Z7901 Long term (current) use of anticoagulants: Secondary | ICD-10-CM | POA: Diagnosis not present

## 2011-07-29 DIAGNOSIS — S72143A Displaced intertrochanteric fracture of unspecified femur, initial encounter for closed fracture: Secondary | ICD-10-CM | POA: Diagnosis not present

## 2011-07-29 DIAGNOSIS — J449 Chronic obstructive pulmonary disease, unspecified: Secondary | ICD-10-CM | POA: Diagnosis not present

## 2011-07-29 DIAGNOSIS — G2581 Restless legs syndrome: Secondary | ICD-10-CM | POA: Diagnosis not present

## 2011-07-29 DIAGNOSIS — Z8551 Personal history of malignant neoplasm of bladder: Secondary | ICD-10-CM | POA: Diagnosis not present

## 2011-07-29 DIAGNOSIS — K6289 Other specified diseases of anus and rectum: Secondary | ICD-10-CM | POA: Diagnosis not present

## 2011-07-29 DIAGNOSIS — N189 Chronic kidney disease, unspecified: Secondary | ICD-10-CM | POA: Diagnosis not present

## 2011-07-29 DIAGNOSIS — J9600 Acute respiratory failure, unspecified whether with hypoxia or hypercapnia: Secondary | ICD-10-CM | POA: Diagnosis not present

## 2011-07-29 DIAGNOSIS — I4891 Unspecified atrial fibrillation: Secondary | ICD-10-CM | POA: Diagnosis not present

## 2011-07-29 DIAGNOSIS — I739 Peripheral vascular disease, unspecified: Secondary | ICD-10-CM | POA: Diagnosis not present

## 2011-07-29 DIAGNOSIS — M810 Age-related osteoporosis without current pathological fracture: Secondary | ICD-10-CM | POA: Diagnosis not present

## 2011-07-29 DIAGNOSIS — Z5189 Encounter for other specified aftercare: Secondary | ICD-10-CM | POA: Diagnosis not present

## 2011-07-29 DIAGNOSIS — Z794 Long term (current) use of insulin: Secondary | ICD-10-CM | POA: Diagnosis not present

## 2011-07-29 DIAGNOSIS — D72829 Elevated white blood cell count, unspecified: Secondary | ICD-10-CM | POA: Diagnosis not present

## 2011-07-29 DIAGNOSIS — I129 Hypertensive chronic kidney disease with stage 1 through stage 4 chronic kidney disease, or unspecified chronic kidney disease: Secondary | ICD-10-CM | POA: Diagnosis not present

## 2011-07-29 DIAGNOSIS — J189 Pneumonia, unspecified organism: Secondary | ICD-10-CM | POA: Diagnosis not present

## 2011-07-29 DIAGNOSIS — D68318 Other hemorrhagic disorder due to intrinsic circulating anticoagulants, antibodies, or inhibitors: Secondary | ICD-10-CM | POA: Diagnosis not present

## 2011-07-29 DIAGNOSIS — K625 Hemorrhage of anus and rectum: Secondary | ICD-10-CM | POA: Diagnosis not present

## 2011-07-29 DIAGNOSIS — I359 Nonrheumatic aortic valve disorder, unspecified: Secondary | ICD-10-CM | POA: Diagnosis not present

## 2011-07-29 DIAGNOSIS — F17200 Nicotine dependence, unspecified, uncomplicated: Secondary | ICD-10-CM | POA: Diagnosis not present

## 2011-07-29 DIAGNOSIS — Z79899 Other long term (current) drug therapy: Secondary | ICD-10-CM | POA: Diagnosis not present

## 2011-07-29 LAB — BASIC METABOLIC PANEL
BUN: 31 mg/dL — ABNORMAL HIGH (ref 6–23)
Chloride: 99 mEq/L (ref 96–112)
GFR calc Af Amer: 72 mL/min — ABNORMAL LOW (ref >90)
Potassium: 3.4 mEq/L — ABNORMAL LOW (ref 3.5–5.1)

## 2011-07-29 LAB — PROTIME-INR: INR: 3.62 — ABNORMAL HIGH (ref 0.00–1.49)

## 2011-07-29 LAB — CBC
HCT: 27 % — ABNORMAL LOW (ref 39.0–52.0)
RDW: 17.3 % — ABNORMAL HIGH (ref 11.5–15.5)
WBC: 14 10*3/uL — ABNORMAL HIGH (ref 4.0–10.5)

## 2011-07-29 LAB — GLUCOSE, CAPILLARY: Glucose-Capillary: 308 mg/dL — ABNORMAL HIGH (ref 70–99)

## 2011-07-29 MED ORDER — WARFARIN - PHARMACIST DOSING INPATIENT: Status: DC

## 2011-07-29 MED ORDER — PREDNISONE 10 MG PO TABS
10.0000 mg | ORAL_TABLET | Freq: Every day | ORAL | Status: DC
  Filled 2011-07-29: qty 1

## 2011-07-29 MED ORDER — VANCOMYCIN HCL 1000 MG IV SOLR
750.0000 mg | Freq: Two times a day (BID) | INTRAVENOUS | Status: DC
  Filled 2011-07-29: qty 750

## 2011-07-29 MED ORDER — AMOXICILLIN-POT CLAVULANATE 875-125 MG PO TABS
1.0000 | ORAL_TABLET | Freq: Two times a day (BID) | ORAL | Status: DC
  Administered 2011-07-29: 1 via ORAL
  Filled 2011-07-29 (×2): qty 1

## 2011-07-29 MED ORDER — INSULIN GLARGINE 100 UNIT/ML ~~LOC~~ SOLN: SUBCUTANEOUS | Status: DC

## 2011-07-29 MED ORDER — INSULIN ASPART 100 UNIT/ML ~~LOC~~ SOLN: SUBCUTANEOUS | Status: DC

## 2011-07-29 MED ORDER — PREDNISONE 10 MG PO TABS: ORAL_TABLET | ORAL | Status: DC

## 2011-07-29 MED ORDER — AMOXICILLIN-POT CLAVULANATE 875-125 MG PO TABS: 1.0000 | ORAL_TABLET | Freq: Two times a day (BID) | ORAL | Status: AC

## 2011-07-29 NOTE — Discharge Summary (Signed)
Physician Discharge Summary  DISCHARGE SUMMARY   [name] MR#: [id number] DOB/AGE: [birth year] 78 y.o.   Attending Physician:[name] M  Patient's NWG:NFAOZ,HYQM M, MD, MD  Consults:Treatment Team:  [name], MD**  Admit date: 07/25/2011 Discharge date: 07/29/2011  Discharge Diagnoses:  Principal Problem:  *PNA (pneumonia) Active Problems:  DIABETES MELLITUS, TYPE II  HYPERLIPIDEMIA  HYPERTENSION  PVD, Rt SFA PTA/HSRA 06/17/11  S/P aortic valve replacement: #23 Magna Ease Edwards Pericardial Valve  November 2012  Leukocytosis  Aspirin allergy, rash, SOB. Pt is on Plavix  Coumadin resumed one week ago for CAF, INR supratheraputic secondary to anibiotics  Persistent atrial fibrillation, rate elevated on adm  Closed right hip fracture, ORIF 06/27/11 complicated by gluteal hematoma while being Coumadinized post op  Normal coronary arteries, cath 11/12  Normal left ventricular systolic function, cath 11/12   Patient Active Problem List  Diagnoses  . DIABETES MELLITUS, TYPE II  . HYPERLIPIDEMIA  . DEPRESSION  . RESTLESS LEGS SYNDROME  . HYPERTENSION  . PVD, Rt SFA PTA/HSRA 06/17/11  . DIVERTICULOSIS, COLON  . RENAL CYST  . ARTHRITIS  . MURMUR  . CAROTID BRUIT  . SKIN CANCER, HX OF  . RHEUMATIC HEART DISEASE, HX OF  . AS (aortic stenosis)  . S/P aortic valve replacement: #23 Magna Ease Edwards Pericardial Valve  November 2012  . Thrombocytopenia  . Leukocytosis  . Aspirin allergy, rash, SOB. Pt is on Plavix  . Coumadin resumed one week ago for CAF, INR supratheraputic secondary to anibiotics  . Persistent atrial fibrillation, rate elevated on adm  . Closed right hip fracture, ORIF 06/27/11 complicated by gluteal hematoma while being Coumadinized post op  . PNA (pneumonia)  . Normal coronary arteries, cath 11/12  . Normal left ventricular systolic function, cath 11/12   Past Medical History  Diagnosis Date  . AS (aortic stenosis)   . HTN  (hypertension)   . Heart murmur   . Thrombocytopenia due to drugs     seen by [name] plts 114000 no rx  . Peripheral vascular disease very poor circulation legs and feet ... stents right and left legs... done in [name] 's office.   . Depression wife died 4 years ago.    Marland Kitchen COPD (chronic obstructive pulmonary disease)   . Shortness of breath   . Recurrent upper respiratory infection (URI)     sinusitis  . Claudication in peripheral vascular disease 06/17/2011  . S/P angioplasty with stent, diamond back rotational athrectomy Prox. Rt. SFA 06/17/2011 06/17/2011  . Neuropathy due to secondary diabetes   . Type II diabetes mellitus   . Pneumonia 07/25/11    left  . Dysrhythmia 5-7 weeks ago ... sees dr Timothy Lasso    atrial fibrilation  . Bundle branch block, left   . Rapid atrial fibrillation 07/25/11  . Blood transfusion     w/hip operation  . History of stomach ulcers ~ 1951  . Bladder cancer     Bladder Cancer local  . Chronic kidney disease     renal artery stent    Discharged Condition: good   Discharge Medications: Medication List  As of 07/29/2011  8:14 AM   TAKE these medications         acetaminophen 325 MG tablet   Commonly known as: TYLENOL   Take 650 mg by mouth every 6 (six) hours as needed. For pain      amoxicillin-clavulanate 875-125 MG per tablet   Commonly known as:  AUGMENTIN   Take 1 tablet by mouth every 12 (twelve) hours.      benazepril 20 MG tablet   Commonly known as: LOTENSIN   Take 20 mg by mouth daily.      clopidogrel 75 MG tablet   Commonly known as: PLAVIX   ONE PO Daily - restart in 1-2 weeks - when safe      diltiazem 240 MG 24 hr capsule   Commonly known as: DILACOR XR   Take 240 mg by mouth daily.      doxazosin 4 MG tablet   Commonly known as: CARDURA   Take 4 mg by mouth at bedtime.      finasteride 5 MG tablet   Commonly known as: PROSCAR   Take 5 mg by mouth daily.      Fluticasone-Salmeterol 250-50 MCG/DOSE Aepb    Commonly known as: ADVAIR   Inhale 1 puff into the lungs 2 (two) times daily.      furosemide 20 MG tablet   Commonly known as: LASIX   Take 20 mg by mouth daily.      glyBURIDE 5 MG tablet   Commonly known as: DIABETA   Take 5-10 mg by mouth 2 (two) times daily. 2 tablets in the morning and 1 tablet in the evening      HYDROcodone-acetaminophen 5-325 MG per tablet   Commonly known as: NORCO   Take 1 tablet by mouth every 8 (eight) hours. Takes at 0600, 1400, 2200      insulin aspart 100 UNIT/ML injection   Commonly known as: novoLOG   Check CBGs 4 times per day with meals and AC -  and follow ISS  CBG < 70: implement hypoglycemia protocol  CBG 70 - 120: 0 units  CBG 121 - 150: 2 units  CBG 151 - 200: 3 units  CBG 201 - 250: 5 units  CBG 251 - 300: 8 units  CBG 301 - 350: 11 units  CBG 351 - 400: 15 units      insulin glargine 100 UNIT/ML injection   Commonly known as: LANTUS   35 units HS.  Titrate up and down based on CBG needs.  Hopefully as Steroids are weaned his insulin requirements will diminish.      loratadine 10 MG tablet   Commonly known as: CLARITIN   Take 1 tablet (10 mg total) by mouth daily.      metoprolol 50 MG tablet   Commonly known as: LOPRESSOR   Take 50 mg by mouth 2 (two) times daily.      polyethylene glycol packet   Commonly known as: MIRALAX / GLYCOLAX   Take 17 g by mouth 2 (two) times daily.      potassium chloride SA 20 MEQ tablet   Commonly known as: K-DUR,KLOR-CON   Take 20 mEq by mouth daily.      predniSONE 10 MG tablet   Commonly known as: DELTASONE   10 mg PO daily for 4 days, then 5 mg po daily for 4 days then 5 mg PO QOD for 4 then D/c      simvastatin 20 MG tablet   Commonly known as: ZOCOR   Take 20 mg by mouth at bedtime.      Warfarin - Pharmacist Dosing Inpatient Misc   Resume Coumadin with Goal INR 2-3.  Check INR Tuesday.  No coumadin today 6/3.            Hospital Procedures: Ct Abdomen Pelvis Wo  Contrast  06/29/2011  *RADIOLOGY REPORT*  Clinical Data: Anticoagulated patient with recent ORIF of a right hip fracture, now with increased edema and bruising extending upward into the pelvis.  Evaluate for retroperitoneal hematoma.  CT ABDOMEN AND PELVIS WITHOUT CONTRAST 06/29/2011:  Technique:  Multidetector CT imaging of the abdomen and pelvis was performed following the standard protocol without intravenous contrast.  Comparison: CT abdomen pelvis without and with contrast 05/10/2008 [facility].  Findings: Respiratory motion blurred images of the upper abdomen, though there a study is diagnostic.  Asymmetric enlargement of the right gluteal muscles, the right pectineus and obturator externus muscles, muscles, and the proximal medial and lateral compartment muscles of the upper right thigh, consistent with intramuscular hemorrhage/hematoma.  A subcutaneous hematoma is present overlying the right lower pelvis at the surgical site, and gas is present within the soft tissues related to the recent surgery.  There is no evidence of right piriformis muscle hematoma, and streaky density in the presacral space may represent edema or hemorrhage; if this does represent hemorrhage, it is a very small amount of retroperitoneal hemorrhage.  Normal unenhanced appearance of the liver, spleen, right adrenal gland, and right kidney.  Mild pancreatic atrophy.  Approximate 2.6 cm simple cyst arising from the upper pole of the left kidney, unchanged; no significant abnormalities involving the left kidney. Mild enlargement the left adrenal gland, unchanged, without nodularity.  Gallbladder contracted as there is food within the stomach, but is normal by CT.  No biliary ductal dilation.  Aorto- iliofemoral distal artery atherosclerosis, with left renal artery and bilateral common iliac artery stents.  Stomach and small bowel normal in appearance.  Sigmoid colon diverticulosis without evidence of acute  diverticulitis.  Moderate stool burden.  Normal appendix in the right upper pelvis.  No ascites.  Moderate median lobe prostate gland enlargement.  Gas within the urinary bladder, presumably related to recent instrumentation.  Normal seminal vesicles.  Bone window images demonstrate the comminuted intertrochanteric right femoral neck fracture with ORIF, generalized osteopenia, old compression fracture of T12, degenerative changes in the sacroiliac joints, and degenerative changes involving the lower thoracic and lumbar spine. Heart enlarged.  Moderate bilateral pleural effusions and associated passive atelectasis in the lower lobes.  IMPRESSION:  1.  Moderate sized hemorrhage/hematoma involving the right gluteal muscles, right pectineus and obturator externus muscles, and the proximal medial and lateral muscle groups of the upper thigh. 2.  Edema versus a very small amount of retroperitoneal hemorrhage in the presacral space.  No large retroperitoneal hemorrhage is identified. 3.  Small subcutaneous hematoma underlying the surgical site laterally in the right lower pelvis. 5.  Stable mild left adrenal hyperplasia. 6.  Stable mild pancreatic atrophy. 7.  Sigmoid colon diverticulosis without evidence of acute diverticulitis. 8.  Stable moderate median lobe prostate gland enlargement.  Original Report Authenticated By: Arnell Sieving, M.D.   Dg Chest 2 View  07/27/2011  *RADIOLOGY REPORT*  Clinical Data: Pneumonia.  Shortness of breath.  CHEST - 2 VIEW  Comparison: Plain film chest 07/25/2011.  Findings: Airspace disease throughout the left chest persists but shows some improvement.  Right lung is clear.  There is no pneumothorax.  Cardiomegaly is noted.  Left pleural effusion is identified.  The patient is status post aortic valve replacement with an atrial appendage closure device again seen.  IMPRESSION: Some improvement in diffuse left side airspace disease, left pleural effusion noted.  Original Report  Authenticated By: Bernadene Bell. D'ALESSIO, M.D.   Dg Chest 2  View  07/25/2011  *RADIOLOGY REPORT*  Clinical Data: Shortness of breath, cough.  CHEST - 2 VIEW  Comparison: 06/29/2011  Findings: Prior median sternotomy and valve replacement.  Diffuse airspace disease throughout the left lung concerning for infection/pneumonia.  Right lung is clear.  Heart is borderline in size.  Small left pleural effusion.  Degenerative changes in the thoracic spine.  IMPRESSION: Diffuse left lung airspace disease concerning for pneumonia.  Small left effusion.  Original Report Authenticated By: Cyndie Chime, M.D.   Dg Chest 2 View  06/29/2011  *RADIOLOGY REPORT*  Clinical Data: Shortness of breath.  Status post hip surgery.  CHEST - 2 VIEW  Comparison: 06/26/2011.  Findings: Stable surgical changes from bypass surgery.  An atrial appendage closure device is noted.  The heart is borderline enlarged but stable. Bibasilar atelectasis is again demonstrated. No pleural effusion.  IMPRESSION: Persistent bibasilar atelectasis.  Original Report Authenticated By: P. Loralie Champagne, M.D.    History of Present Illness: 59 Male with MMP, Vasculopathy, Afib, recent Hip fracture getting rehab at [facility] and was getting ready to d/c in the next one week. For the last one week prior to admit he had been having increased DOE and coughing. He awoke at 2 am on day of admit with increased SOB and needing "air". He was assessed and given treatments. When they did not help he was transported by EMS to [facility]. His WBC was > 19k and his CXR is C/w PNA. He was given O2 and Abx and I was called for inpt admission. His HR was Afib with RVR @ 130. According to [facility] - Coumadin was re-started @ 1 week PTA and Levaquin was atarted 5/29th. He had Vit K 5mg  PTA x 2.   Hospital Course: Admitted Thursday with PNA and ?COPD exac.  He was admitted to step down on O2, with nebs - Xopenex, on Cardizem gtt and seen by Cards.  He received IV  Abx and IV steroids. He improved quickly. His HR slowed down and he was easily transitioned back to oral rate controlling agents. His INR shot up and he was given Vit K.  We let his INR drift down over the weekend.  He did receive Coumadin 1 mg po x 1 on 07/28/11. INR 3.62* 07/29/2011  INR 2.79* 07/28/2011  INR 3.27* 07/27/2011  INR  8.53* 07/26/2011  INR  6.55* 07/26/2011 INR 3.91* 07/25/2011  INR 1.23 07/02/2011     We can hold coumadin again tonight and reassess his INR tomorrow and decide.   PNA - poss Hosp Acquired? But doubt based on how quick he improved.   We continue IV Abx and weaned to Augmentin today for 6 more doses.  He can complete these at [facility].  O2 was weaned easily.  Nebs were tolerated. ,Pulmonary toilet was provided via incentive spirometry and Flutter valve. IV were weaned oral steroids and will be tapered quickly.  Repeat CXR 6/1 = Some improvement in diffuse left side airspace disease, left pleural effusion noted. Follow up CXR 1-2 weeks.  Full code.  significant vasculopathy - continue plavix,  AFib with RVR. Dr Allyson Sabal was consulted.  He is off IV Cardizem.  He is on his baselibne home meds with Rate control at this moment. AV stenosis S/P Bioprosthetic aortic valve replacement in November 2012. Coumadin goal is 2-3 INR. COPD (still smoking) - Steroids, continue Advair inhaler. Xopenex nebs may be added prn if needed. S/P traumatic fall 06/26/11 and R Hip fracture  S/P ORIF complicated by R Thigh hematoma. D/ced to [facility] 07/02/11. He underwent PT and CW. He is excited to go back to [place] today/Monday to complete their program prior to returning home.  HTN- fine.  Tobacco abuse - needs to quit  Anemia with hbg 8.8-9.0- stable.  s/p Post op Blood Loss anemia and a total of 4 PRBCs due the the large hematoma during last admission. He was Type and Screened and did not need any transfusions during this hospital stay. Peripheral vascular disease S/P recent PTCA - on  Plavix , complicated by smoking, followed by Dr. Allyson Sabal  Osteoporosis - will get DEXA and be evaluated for meds as an outpt.   DM2 with complications- ISS, last hemoglobin A1c 8.1% complicated by vascular disease and potential neuropathy. Lantus /ISS. Glyburide. His CBGs are too high and can use further titration of meds but expect some improvements with tapering prednisone.  He is stable for D/C to SNF for rehab,     Day of Discharge Exam BP 158/64  Pulse 75  Temp(Src) 97.8 F (36.6 C) (Oral)  Resp 18  Ht 5\' 11"  (1.803 m)  Wt 83.1 kg (183 lb 3.2 oz)  BMI 25.55 kg/m2  SpO2 95%  Discharge Labs:  [facility] 07/29/11 0705  NA 137  K 3.4*  CL 99  CO2 28  GLUCOSE 326*  BUN 31*  CREATININE 1.05  CALCIUM 8.6  MG --  PHOS --   No results found for this basename: AST:2,ALT:2,ALKPHOS:2,BILITOT:2,PROT:2,ALBUMIN:2 in the last 72 hours  Basename 07/29/11 0705  WBC 14.0*  NEUTROABS --  HGB 9.0*  HCT 27.0*  MCV 89.4  PLT 238   No results found for this basename: CKTOTAL:3,CKMB:3,CKMBINDEX:3,TROPONINI:3 in the last 72 hours No results found for this basename: TSH,T4TOTAL,FREET3,T3FREE,THYROIDAB in the last 72 hours No results found for this basename: VITAMINB12:2,FOLATE:2,FERRITIN:2,TIBC:2,IRON:2,RETICCTPCT:2 in the last 72 hours Lab Results  Component Value Date   INR 3.62* 07/29/2011   INR 2.79* 07/28/2011   INR 3.27* 07/27/2011       Discharge instructions:  03-Skilled Nursing Facility    Disposition: [facility]  Follow-up Appts: Follow-up with Dr. Timothy Lasso at [facility] 2 weeks post D/C from SNF.  Call for appointment.  Condition on Discharge: good  Tests Needing Follow-up: CXR, INR, and Hbg  Time spent in discharge (includes decision making & examination of pt): 35 min  Signed: Thao Bauza M 07/29/2011, 8:14 AM

## 2011-07-29 NOTE — Progress Notes (Signed)
Subjective: Admitted with PNA and ?COPD exac. He looks better. One BM Breathing well. Off O2. He reports being ready for D/C back to [facility] to complete rehab prior to returning home. CBGs high - will wean steroids.  Objective: Vital signs in last 24 hours: Temp:  [97.5 F (36.4 C)-97.8 F (36.6 C)] 97.8 F (36.6 C) (06/03 0459) Pulse Rate:  [75-84] 75  (06/03 0459) Resp:  [16-19] 18  (06/03 0459) BP: (130-158)/(59-71) 158/64 mmHg (06/03 0459) SpO2:  [94 %-98 %] 95 % (06/03 0459) Weight:  [83.1 kg (183 lb 3.2 oz)] 83.1 kg (183 lb 3.2 oz) (06/02 2138) Weight change: -0.5 kg (-1 lb 1.6 oz) Last BM Date: 07/28/11  CBG (last 3)   Basename 07/29/11 0724 07/28/11 2135 07/28/11 1723  GLUCAP 308* 297* 268*    Intake/Output from previous day:  Intake/Output Summary (Last 24 hours) at 07/29/11 0753 Last data filed at 07/29/11 0700  Gross per 24 hour  Intake    600 ml  Output   2650 ml  Net  -2050 ml   06/02 0701 - 06/03 0700 In: 600 [P.O.:600] Out: 2650 [Urine:2650]   Physical Exam General appearance: Alert and oriented sitting up in bed.  Min cough. O2 off. Breathing well. HOH Throat: oropharynx moist without erythema Resp: Distant without wheeze and rhonchi. Cardio: Irreg. GI: soft, non-tender; bowel sounds normal; no masses,  no organomegaly Extremities: no clubbing, cyanosis or edema. Stockings in place.   Lab Results: No results found for this basename: NA:2,K:2,CL:2,CO2:2,GLUCOSE:2,BUN:2,CREATININE:2,CALCIUM:2,MG:2,PHOS:2 in the last 72 hours  No results found for this basename: AST:2,ALT:2,ALKPHOS:2,BILITOT:2,PROT:2,ALBUMIN:2 in the last 72 hours   Basename 07/29/11 0705  WBC 14.0*  NEUTROABS --  HGB 9.0*  HCT 27.0*  MCV 89.4  PLT 238    Lab Results  Component Value Date   INR 3.62* 07/29/2011   INR 2.79* 07/28/2011   INR 3.27* 07/27/2011    No results found for this basename: CKTOTAL:3,CKMB:3,CKMBINDEX:3,TROPONINI:3 in the last 72 hours  No  results found for this basename: TSH,T4TOTAL,FREET3,T3FREE,THYROIDAB in the last 72 hours  No results found for this basename: VITAMINB12:2,FOLATE:2,FERRITIN:2,TIBC:2,IRON:2,RETICCTPCT:2 in the last 72 hours  Micro Results: Recent Results (from the past 240 hour(s))  CULTURE, BLOOD (ROUTINE X 2)     Status: Normal (Preliminary result)   Collection Time   07/25/11  9:00 AM      Component Value Range Status Comment   Specimen Description BLOOD ARM RIGHT   Final    Special Requests BOTTLES DRAWN AEROBIC AND ANAEROBIC 10CC   Final    Culture  Setup Time [id number]   Final    Culture     Final    Value:        BLOOD CULTURE RECEIVED NO GROWTH TO DATE CULTURE WILL BE HELD FOR 5 DAYS BEFORE ISSUING A FINAL NEGATIVE REPORT   Report Status PENDING   Incomplete   CULTURE, BLOOD (ROUTINE X 2)     Status: Normal (Preliminary result)   Collection Time   07/25/11  9:15 AM      Component Value Range Status Comment   Specimen Description BLOOD HAND RIGHT   Final    Special Requests BOTTLES DRAWN AEROBIC AND ANAEROBIC 10CC   Final    Culture  Setup Time [id number]   Final    Culture     Final    Value:        BLOOD CULTURE RECEIVED NO GROWTH TO DATE CULTURE WILL BE HELD FOR 5 DAYS BEFORE ISSUING  A FINAL NEGATIVE REPORT   Report Status PENDING   Incomplete   MRSA PCR SCREENING     Status: Normal   Collection Time   07/25/11  3:30 PM      Component Value Range Status Comment   MRSA by PCR NEGATIVE  NEGATIVE  Final      Studies/Results: No results found.   Medications: Scheduled:    . antiseptic oral rinse  15 mL Mouth Rinse BID  . atorvastatin  10 mg Oral q1800  . benazepril  20 mg Oral Daily  . clopidogrel  75 mg Oral Q breakfast  . diltiazem  240 mg Oral Daily  . doxazosin  4 mg Oral QHS  . feeding supplement  237 mL Oral Q1200  . finasteride  5 mg Oral Daily  . Fluticasone-Salmeterol  1 puff Inhalation BID  . furosemide  20 mg Oral Daily  . glyBURIDE  10 mg Oral Q breakfast  .  glyBURIDE  5 mg Oral QAC supper  . insulin aspart  0-15 Units Subcutaneous TID WC  . insulin glargine  35 Units Subcutaneous QHS  . ipratropium  0.5 mg Nebulization Q6H  . levalbuterol  0.63 mg Nebulization Q6H  . metoprolol  50 mg Oral BID  . piperacillin-tazobactam (ZOSYN)  IV  3.375 g Intravenous Q8H  . polyethylene glycol  17 g Oral BID  . predniSONE  20 mg Oral QAC breakfast  . vancomycin  750 mg Intravenous Q12H  . warfarin  1 mg Oral ONCE-1800  . Warfarin - Pharmacist Dosing Inpatient   Does not apply q1800  . DISCONTD: insulin glargine  30 Units Subcutaneous QHS  . DISCONTD: loratadine  10 mg Oral Daily  . DISCONTD: vancomycin  1,000 mg Intravenous Q12H   Continuous:    . DISCONTD: sodium chloride 30 mL/hr at 07/27/11 1218     Assessment/Plan: Principal Problem:  *PNA (pneumonia) Active Problems:  DIABETES MELLITUS, TYPE II  HYPERLIPIDEMIA  HYPERTENSION  PVD, Rt SFA PTA/HSRA 06/17/11  S/P aortic valve replacement: #23 Magna Ease Edwards Pericardial Valve  November 2012  Leukocytosis  Aspirin allergy, rash, SOB. Pt is on Plavix  Coumadin resumed one week ago for CAF, INR supratheraputic secondary to anibiotics  Persistent atrial fibrillation, rate elevated on adm  Closed right hip fracture, ORIF 06/27/11 complicated by gluteal hematoma while being Coumadinized post op  Normal coronary arteries, cath 11/12  Normal left ventricular systolic function, cath 11/12   PNA in this vasculopathic male with COPD who still smokes. Change to oral Abx, he is off O2, Nebs vrs HFAs, pulmonary toilet and wean oral steroids. OK to transition to SNF for Rehab. Full code.  significant vasculopathy - continue plavix,  AFib with RVR.  Dr Allyson Sabal. Resolved with resolution of illness.   Supratherapeutic INR - S/P Vit K. Still too high.  Slow and careful dosing and freq INR Checks. COPD (still smoking) - continue current Rxs. HTN- fine. Tobacco abuse - needs to quit tobacco for good. Anemia  with hbg 9.0 to 8.8 - stable  Peripheral vascular disease S/P recent PTCA - on Plavix , complicated by smoking, followed by Dr. Allyson Sabal  Osteoporosis - will get DEXA and be evaluated for meds as an outpt.   DM2 with complications- ISS, last hemoglobin A1c 8.1% complicated by vascular disease and potential neuropathy. Lantus /ISS. Glyburide.   Should improve as we wean steroids.  Basename 07/29/11 0724 07/28/11 2135 07/28/11 1723  GLUCAP 308* 297* 268*   ID -  Anti-infectives     Start     Dose/Rate Route Frequency Ordered Stop   07/29/11 1400   vancomycin (VANCOCIN) 750 mg in sodium chloride 0.9 % 150 mL IVPB        750 mg 150 mL/hr over 60 Minutes Intravenous Every 12 hours 07/29/11 0057     07/26/11 0000   vancomycin (VANCOCIN) IVPB 1000 mg/200 mL premix  Status:  Discontinued        1,000 mg 200 mL/hr over 60 Minutes Intravenous Every 12 hours 07/25/11 1458 07/29/11 0057   07/25/11 2000  piperacillin-tazobactam (ZOSYN) IVPB 3.375 g       3.375 g 12.5 mL/hr over 240 Minutes Intravenous 3 times per day 07/25/11 1438     07/25/11 1100   vancomycin (VANCOCIN) 1,500 mg in sodium chloride 0.9 % 500 mL IVPB        1,500 mg 250 mL/hr over 120 Minutes Intravenous  Once 07/25/11 1011 07/25/11 1414   07/25/11 1015  piperacillin-tazobactam (ZOSYN) IVPB 3.375 g       3.375 g 12.5 mL/hr over 240 Minutes Intravenous  Once 07/25/11 1011 07/25/11 1159         DVT Prophylaxis  Plan for today is go to [employer].  FL2 signed.   LOS: 4 days   Renelda Kilian M 07/29/2011, 7:53 AM

## 2011-07-29 NOTE — Clinical Social Work Note (Signed)
Patient discharged back to [facility] skilled nursing facility to continue his short-term rehab. Discharge information forwarded to facility. CSW facilitated transport to facility via ambulance.  Genelle Bal, MSW, LCSW [phone]

## 2011-07-29 NOTE — Progress Notes (Signed)
Ambulance transport is here to transport pt to [facility] via Doctor, general practice. Pt has clothing on and belongings packed up. Pt wanted [place] to just keep the flowers he had at his bedside.

## 2011-07-29 NOTE — Progress Notes (Signed)
Subjective:  Awake and alert  Objective:  Vital Signs in the last 24 hours: Temp:  [97.5 F (36.4 C)-98 F (36.7 C)] 98 F (36.7 C) (06/03 0909) Pulse Rate:  [75-86] 86  (06/03 0909) Resp:  [16-18] 17  (06/03 0909) BP: (140-158)/(53-71) 140/53 mmHg (06/03 0909) SpO2:  [94 %-98 %] 95 % (06/03 0909) Weight:  [83.1 kg (183 lb 3.2 oz)] 83.1 kg (183 lb 3.2 oz) (06/02 2138)  Intake/Output from previous day:  Intake/Output Summary (Last 24 hours) at 07/29/11 0911 Last data filed at 07/29/11 1191  Gross per 24 hour  Intake    660 ml  Output   2800 ml  Net  -2140 ml    Physical Exam: General appearance: alert, cooperative and no distress Lungs: dcreased breath sounds Lt base Heart: irregularly irregular rhythm   Rate: 80  Rhythm: atrial fibrillation  Lab Results:  Basename 07/29/11 0705  WBC 14.0*  HGB 9.0*  PLT 238    Basename 07/29/11 0705  NA 137  K 3.4*  CL 99  CO2 28  GLUCOSE 326*  BUN 31*  CREATININE 1.05   No results found for this basename: TROPONINI:2,CK,MB:2 in the last 72 hours Hepatic Function Panel No results found for this basename: PROT,ALBUMIN,AST,ALT,ALKPHOS,BILITOT,BILIDIR,IBILI in the last 72 hours No results found for this basename: CHOL in the last 72 hours  Basename 07/29/11 0705  INR 3.62*    Imaging: Imaging results have been reviewed  Cardiac Studies:  Assessment/Plan:   Principal Problem:  *PNA (pneumonia) Active Problems:  Leukocytosis  Coumadin resumed one week ago for CAF, INR supratheraputic secondary to anibiotics  Persistent atrial fibrillation, rate elevated on adm  DIABETES MELLITUS, TYPE II  HYPERLIPIDEMIA  HYPERTENSION  PVD, Rt SFA PTA/HSRA 06/17/11  S/P aortic valve replacement: #23 Magna Ease Edwards Pericardial Valve  November 2012  Aspirin allergy, rash, SOB. Pt is on Plavix  Closed right hip fracture, ORIF 06/27/11 complicated by gluteal hematoma while being Coumadinized post op  Normal coronary arteries, cath  11/12  Normal left ventricular systolic function, cath 11/12  Plan- He is going to [facility]. INR follow up there in am. We will arrange follow up with Dr Allyson Sabal in a month or so.  Corine Shelter PA-C 07/29/2011, 9:11 AM  Stable Afib CVR over the weekend.   He is scheduled for discharge per Dr. Timothy Lasso  I agree with Mr. Wynelle Link findings & exam as well as impression & disposition.   Marykay Lex, M.D., M.S. [facility] [address] [place], [place]  [address]  [phone] Pager # [phone]  07/29/2011 2:44 PM

## 2011-07-29 NOTE — Progress Notes (Signed)
Just got off the phone with [facility] where I gave the Nurse report on the patient. Pt awaiting ambulance for transport.

## 2011-07-29 NOTE — Progress Notes (Signed)
ANTICOAGULATION CONSULT NOTE - Follow Up Consult  Pharmacy Consult:  Coumadin Indication: atrial fibrillation  Allergies  Allergen Reactions  . Aspirin Anaphylaxis, Shortness Of Breath and Rash    "broke out in white welts; red blotches neck and face; windpipe closing; ~ 1962"    Patient Measurements: Height: 5\' 11"  (180.3 cm) Weight: 183 lb 3.2 oz (83.1 kg) IBW/kg (Calculated) : 75.3   Vital Signs: Temp: 98 F (36.7 C) (06/03 0909) Temp src: Oral (06/03 0909) BP: 140/53 mmHg (06/03 0909) Pulse Rate: 86  (06/03 0909)  Labs:  Basename 07/29/11 0705 07/28/11 0542 07/27/11 0530  HGB 9.0* -- --  HCT 27.0* -- --  PLT 238 -- --  APTT -- -- --  LABPROT 36.6* 29.9* 33.8*  INR 3.62* 2.79* 3.27*  HEPARINUNFRC -- -- --  CREATININE 1.05 -- --  CKTOTAL -- -- --  CKMB -- -- --  TROPONINI -- -- --    Estimated Creatinine Clearance: 52.8 ml/min (by C-G formula based on Cr of 1.05).    Assessment: 78 yo male on chronic Coumadin for h/o Afib.  Presented with elevated INR due to DDI with antibiotics, s/p Vit K x 3 doses.  Coumadin resumed 07/28/11 at 1mg  and INR is supratherapeutic today.  Patient very sensitive to Coumadin.  No bleeding documented.   Goal of Therapy:  INR 2-3 Monitor platelets by anticoagulation protocol: Yes   Plan:  - Hold Coumadin today d/t elevated INR - INR in AM if still here - F/U CBGs and up-titrating of Lantus, K+     Ermelinda Eckert D. Laney Potash, PharmD, BCPS Pager:  [phone] 07/29/2011, 10:37 AM

## 2011-07-29 NOTE — Progress Notes (Signed)
ANTIBIOTIC CONSULT NOTE - FOLLOW UP  Pharmacy Consult for Vancomycin Indication: rule out pneumonia  Allergies  Allergen Reactions  . Aspirin Anaphylaxis, Shortness Of Breath and Rash    "broke out in white welts; red blotches neck and face; windpipe closing; ~ 1962"    Patient Measurements: Height: 5\' 11"  (180.3 cm) Weight: 183 lb 3.2 oz (83.1 kg) IBW/kg (Calculated) : 75.3   Assessment: 89 YOM on antibiotics Day #4 for HCAP. On Vanco 1g q12, vancomycin trough (51mcg/ml) slightly supratherapeutic. WBC down to 10.9, 5/30 blood cx- NGTD. MRSA PCR neg. Afeb. Est. T1/2 ~ 16hrs, renal function has been stable, UOP good.   Goal of Therapy:  Vancomycin trough level 15-20 mcg/ml  Plan:  - change vancomycin to 750mg  IV Q 12hrs, next dose at 1400 - f/u renal function and LOT  Bayard Hugger, PharmD, BCPS  Clinical Pharmacist  Pager: [phone]  07/29/2011,12:38 AM

## 2011-07-30 DIAGNOSIS — I4891 Unspecified atrial fibrillation: Secondary | ICD-10-CM | POA: Diagnosis not present

## 2011-07-30 DIAGNOSIS — J189 Pneumonia, unspecified organism: Secondary | ICD-10-CM | POA: Diagnosis not present

## 2011-07-31 LAB — CULTURE, BLOOD (ROUTINE X 2)
Culture  Setup Time: 201305301456
Culture: NO GROWTH

## 2011-08-11 ENCOUNTER — Encounter (HOSPITAL_COMMUNITY): Payer: Self-pay | Admitting: Emergency Medicine

## 2011-08-11 ENCOUNTER — Emergency Department (HOSPITAL_COMMUNITY)
Admission: EM | Admit: 2011-08-11 | Discharge: 2011-08-11 | Disposition: A | Discharge status: Rehabilitation Facility | Payer: Medicare Other | Attending: Emergency Medicine | Admitting: Emergency Medicine

## 2011-08-11 DIAGNOSIS — Z7901 Long term (current) use of anticoagulants: Secondary | ICD-10-CM | POA: Diagnosis not present

## 2011-08-11 DIAGNOSIS — J449 Chronic obstructive pulmonary disease, unspecified: Secondary | ICD-10-CM | POA: Insufficient documentation

## 2011-08-11 DIAGNOSIS — Z8551 Personal history of malignant neoplasm of bladder: Secondary | ICD-10-CM | POA: Insufficient documentation

## 2011-08-11 DIAGNOSIS — Z79899 Other long term (current) drug therapy: Secondary | ICD-10-CM | POA: Insufficient documentation

## 2011-08-11 DIAGNOSIS — F17200 Nicotine dependence, unspecified, uncomplicated: Secondary | ICD-10-CM | POA: Insufficient documentation

## 2011-08-11 DIAGNOSIS — K6289 Other specified diseases of anus and rectum: Secondary | ICD-10-CM | POA: Insufficient documentation

## 2011-08-11 DIAGNOSIS — N189 Chronic kidney disease, unspecified: Secondary | ICD-10-CM | POA: Insufficient documentation

## 2011-08-11 DIAGNOSIS — Z794 Long term (current) use of insulin: Secondary | ICD-10-CM | POA: Insufficient documentation

## 2011-08-11 DIAGNOSIS — J4489 Other specified chronic obstructive pulmonary disease: Secondary | ICD-10-CM | POA: Insufficient documentation

## 2011-08-11 DIAGNOSIS — I4891 Unspecified atrial fibrillation: Secondary | ICD-10-CM | POA: Insufficient documentation

## 2011-08-11 DIAGNOSIS — D68318 Other hemorrhagic disorder due to intrinsic circulating anticoagulants, antibodies, or inhibitors: Secondary | ICD-10-CM | POA: Diagnosis not present

## 2011-08-11 DIAGNOSIS — E119 Type 2 diabetes mellitus without complications: Secondary | ICD-10-CM | POA: Insufficient documentation

## 2011-08-11 DIAGNOSIS — I129 Hypertensive chronic kidney disease with stage 1 through stage 4 chronic kidney disease, or unspecified chronic kidney disease: Secondary | ICD-10-CM | POA: Insufficient documentation

## 2011-08-11 LAB — POCT I-STAT, CHEM 8
BUN: 21 mg/dL (ref 6–23)
Calcium, Ion: 1.2 mmol/L (ref 1.12–1.32)
Chloride: 104 mEq/L (ref 96–112)
Creatinine, Ser: 1.2 mg/dL (ref 0.50–1.35)

## 2011-08-11 LAB — PROTIME-INR: INR: 4.74 — ABNORMAL HIGH (ref 0.00–1.49)

## 2011-08-11 NOTE — ED Notes (Signed)
ZOX:[id number]<AV> Expected date:08/11/11<BR> Expected time:<BR> Means of arrival:<BR> Comments:<BR> EMS 100 GC - poss gi bleed

## 2011-08-11 NOTE — ED Provider Notes (Signed)
History     CSN: [id number]  Arrival date & time 08/11/11  1642   First MD Initiated Contact with Patient 08/11/11 1718      Chief Complaint  Patient presents with  . Hemorrhoids    (Consider location/radiation/quality/duration/timing/severity/associated sxs/prior treatment) The history is provided by the patient, medical records and a relative.  Pt is in a rehab facility.  He takes coumadin for afib.  This am when he wiped his bottom, he noted redness and a soft mass around his anus.   He reported this to the staff.   They notified his md.  The md said he wanted "another sample".  This afternoon, the pt noted the redness and swelling again when he went to the bathroom.   No one examined him. EMS was called. He was brought here for eval.   He denies pain anywhere.  He denies constipation.  He denies straining at stool. He denies light headedness or sob.  He denies hx of gi cancer.   He is asx now.    Past Medical History  Diagnosis Date  . AS (aortic stenosis)   . HTN (hypertension)   . Heart murmur   . Thrombocytopenia due to drugs     seen by [name] plts [id number] no rx  . Peripheral vascular disease very poor circulation legs and feet ... stents right and left legs... done in [name] 's office.   . Depression wife died 4 years ago.    Marland Kitchen COPD (chronic obstructive pulmonary disease)   . Shortness of breath   . Recurrent upper respiratory infection (URI)     sinusitis  . Claudication in peripheral vascular disease 06/17/2011  . S/P angioplasty with stent, diamond back rotational athrectomy Prox. Rt. SFA 06/17/2011 06/17/2011  . Neuropathy due to secondary diabetes   . Type II diabetes mellitus   . Pneumonia 07/25/11    left  . Dysrhythmia 5-7 weeks ago ... sees dr Timothy Lasso    atrial fibrilation  . Bundle branch block, left   . Rapid atrial fibrillation 07/25/11  . Blood transfusion     w/hip operation  . History of stomach ulcers ~ 1951  . Bladder cancer     Bladder Cancer  local  . Chronic kidney disease     renal artery stent    Past Surgical History  Procedure Date  . Peripheral arterial stent graft     2006 left anf right illiac stents Dr Orson Slick  . Aortic valve replacement 01/07/2011    Procedure: AORTIC VALVE REPLACEMENT (AVR);  Surgeon: Delight Ovens, MD;  Location: [facility] OR;  Service: Open Heart Surgery;  Laterality: N/A;  . Cardioversion 04/03/2011    Procedure: CARDIOVERSION;  Surgeon: Marykay Lex, MD;  Location: [facility] OR;  Service: Cardiovascular;  Laterality: N/A;  . Atherectomy 06/17/2011    balloon atherectomy  . Femur im nail 06/27/2011    Procedure: INTRAMEDULLARY (IM) NAIL FEMORAL;  Surgeon: Senaida Lange, MD;  Location: [facility] ORS;  Service: Orthopedics;  Laterality: Right;  . Tonsillectomy and adenoidectomy     "when I was a kid"  . Cholecystectomy   . Cardiac valve replacement 12/2010    AVR  . Cataract extraction w/ intraocular lens  implant, bilateral ~ 2007  . Angioplasty / stenting femoral 05/2011    RLE  . Renal artery stent 2006    "I believe"    Family History  Problem Relation Age of Onset  . Heart disease Mother   .  Cancer Brother   . Stroke Father     History  Substance Use Topics  . Smoking status: Current Everyday Smoker -- 2.0 packs/day for 75 years    Types: Cigarettes  . Smokeless tobacco: Never Used  . Alcohol Use: 1.2 oz/week    2 Cans of beer per week      Review of Systems  Constitutional: Negative for fever and chills.  Respiratory: Negative for shortness of breath.   Cardiovascular: Negative for chest pain and palpitations.  Gastrointestinal: Negative for nausea, vomiting, abdominal pain, diarrhea, constipation and blood in stool.  Skin: Negative for color change and pallor.  Neurological: Negative for dizziness and light-headedness.  Psychiatric/Behavioral: Negative for confusion.  All other systems reviewed and are negative.    Allergies  Aspirin  Home Medications   Current Outpatient Rx    Name Route Sig Dispense Refill  . ACETAMINOPHEN 325 MG PO TABS Oral Take 650 mg by mouth every 6 (six) hours as needed. For pain    . BENAZEPRIL HCL 20 MG PO TABS Oral Take 20 mg by mouth daily.    Marland Kitchen CLOPIDOGREL BISULFATE 75 MG PO TABS Oral Take 75 mg by mouth daily.    Marland Kitchen DOXAZOSIN MESYLATE 4 MG PO TABS Oral Take 4 mg by mouth daily.     Marland Kitchen FINASTERIDE 5 MG PO TABS Oral Take 5 mg by mouth daily.      Marland Kitchen FLUTICASONE-SALMETEROL 250-50 MCG/DOSE IN AEPB Inhalation Inhale 1 puff into the lungs every 12 (twelve) hours.    . FUROSEMIDE 20 MG PO TABS Oral Take 20 mg by mouth daily.      . GLYBURIDE 5 MG PO TABS Oral Take 5-10 mg by mouth See admin instructions. 2 tablets in the morning and 1 tablet in the evening    . HYDROCODONE-ACETAMINOPHEN 5-325 MG PO TABS Oral Take 1 tablet by mouth every 8 (eight) hours. Takes at 0600, 1400, 2200    . INSULIN ASPART 100 UNIT/ML [place] SOLN  Check CBGs 4 times per day with meals and AC -  and follow ISS CBG < 70: implement hypoglycemia protocol CBG 70 - 120: 0 units CBG 121 - 150: 2 units CBG 151 - 200: 3 units CBG 201 - 250: 5 units CBG 251 - 300: 8 units CBG 301 - 350: 11 units CBG 351 - 400: 15 units 1 vial   . INSULIN GLARGINE 100 UNIT/ML [place] SOLN  35 units HS.  Titrate up and down based on CBG needs.  Hopefully as Steroids are weaned his insulin requirements will diminish. 10 mL   . LORATADINE 10 MG PO TABS Oral Take 10 mg by mouth daily.    Marland Kitchen LORAZEPAM 0.5 MG PO TABS Oral Take 0.5 mg by mouth at bedtime.    Marland Kitchen POLYETHYLENE GLYCOL 3350 PO PACK Oral Take 17 g by mouth 2 (two) times daily.    Marland Kitchen POTASSIUM CHLORIDE CRYS ER 20 MEQ PO TBCR Oral Take 20 mEq by mouth daily.    Marland Kitchen SIMVASTATIN 10 MG PO TABS Oral Take 10 mg by mouth at bedtime.    . WARFARIN SODIUM 3 MG PO TABS Oral Take 3 mg by mouth every other day.    . WARFARIN SODIUM 4 MG PO TABS Oral Take 4 mg by mouth every other day.    Marland Kitchen DILTIAZEM HCL ER 240 MG PO CP24 Oral Take 240 mg by mouth daily.    Marland Kitchen  METOPROLOL TARTRATE 50 MG PO TABS Oral Take 50  mg by mouth 2 (two) times daily.      BP 124/52  Pulse 77  Resp 16  SpO2 97%  Physical Exam  Vitals reviewed. Constitutional: He is oriented to person, place, and time. He appears well-developed and well-nourished. No distress.  HENT:  Head: Normocephalic and atraumatic.  Eyes: Conjunctivae are normal.       No conjunctival pallor  Neck: Normal range of motion.  Cardiovascular: Normal rate.   Murmur heard. Pulmonary/Chest: Effort normal. No respiratory distress.  Abdominal: Soft. He exhibits no distension. There is no tenderness.  Genitourinary: Rectum normal.       No external hemorrhoids, no fissures.  No masses  Musculoskeletal: Normal range of motion. He exhibits no edema.  Neurological: He is alert and oriented to person, place, and time.  Skin: Skin is warm and dry.  Psychiatric: He has a normal mood and affect. Thought content normal.    ED Course  Procedures (including critical care time)   Labs Reviewed  PROTIME-INR   No results found.   No diagnosis found.    MDM  Evaluation of anal complaint supratherapeutic INR        Cheri Guppy, MD 08/11/11 1934

## 2011-08-11 NOTE — Discharge Instructions (Signed)
Your Coumadin level is too high.  Do not take Coumadin.  For 2 days, then resume urine normal dose.  Followup with Dr. Timothy Lasso for reevaluation.  Call for appointment time return for worse or uncontrolled symptoms

## 2011-08-11 NOTE — ED Notes (Signed)
Patient discharge with daughter via wheelchair. Respirations equal and unlabored. Skin warm and dry. No acute distress noted.

## 2011-08-11 NOTE — ED Notes (Signed)
Pt coming from [facility] home c/o bleeding per rectum.  Hemorrhoid noted to rectum.  Bright red frank blood on stool.

## 2011-08-11 NOTE — ED Notes (Signed)
Per EMS: Pt coming from [facility].  C/o bright red blood ON stool not IN stool.  Bulge noted to rectum.  Denies pain.

## 2011-08-14 DIAGNOSIS — S72143A Displaced intertrochanteric fracture of unspecified femur, initial encounter for closed fracture: Secondary | ICD-10-CM | POA: Diagnosis not present

## 2011-08-21 DIAGNOSIS — M6281 Muscle weakness (generalized): Secondary | ICD-10-CM | POA: Diagnosis not present

## 2011-08-21 DIAGNOSIS — S72453A Displaced supracondylar fracture without intracondylar extension of lower end of unspecified femur, initial encounter for closed fracture: Secondary | ICD-10-CM | POA: Diagnosis not present

## 2011-08-21 DIAGNOSIS — Z7901 Long term (current) use of anticoagulants: Secondary | ICD-10-CM | POA: Diagnosis not present

## 2011-08-21 DIAGNOSIS — I4891 Unspecified atrial fibrillation: Secondary | ICD-10-CM | POA: Diagnosis not present

## 2011-08-21 DIAGNOSIS — Z4789 Encounter for other orthopedic aftercare: Secondary | ICD-10-CM | POA: Diagnosis not present

## 2011-08-21 DIAGNOSIS — R269 Unspecified abnormalities of gait and mobility: Secondary | ICD-10-CM | POA: Diagnosis not present

## 2011-08-23 DIAGNOSIS — R269 Unspecified abnormalities of gait and mobility: Secondary | ICD-10-CM | POA: Diagnosis not present

## 2011-08-23 DIAGNOSIS — M6281 Muscle weakness (generalized): Secondary | ICD-10-CM | POA: Diagnosis not present

## 2011-08-23 DIAGNOSIS — S72453A Displaced supracondylar fracture without intracondylar extension of lower end of unspecified femur, initial encounter for closed fracture: Secondary | ICD-10-CM | POA: Diagnosis not present

## 2011-08-23 DIAGNOSIS — I872 Venous insufficiency (chronic) (peripheral): Secondary | ICD-10-CM | POA: Diagnosis not present

## 2011-08-23 DIAGNOSIS — J159 Unspecified bacterial pneumonia: Secondary | ICD-10-CM | POA: Diagnosis not present

## 2011-08-23 DIAGNOSIS — Z4789 Encounter for other orthopedic aftercare: Secondary | ICD-10-CM | POA: Diagnosis not present

## 2011-08-23 DIAGNOSIS — I4891 Unspecified atrial fibrillation: Secondary | ICD-10-CM | POA: Diagnosis not present

## 2011-08-23 DIAGNOSIS — R609 Edema, unspecified: Secondary | ICD-10-CM | POA: Diagnosis not present

## 2011-08-26 DIAGNOSIS — Z4789 Encounter for other orthopedic aftercare: Secondary | ICD-10-CM | POA: Diagnosis not present

## 2011-08-26 DIAGNOSIS — R269 Unspecified abnormalities of gait and mobility: Secondary | ICD-10-CM | POA: Diagnosis not present

## 2011-08-26 DIAGNOSIS — S72453A Displaced supracondylar fracture without intracondylar extension of lower end of unspecified femur, initial encounter for closed fracture: Secondary | ICD-10-CM | POA: Diagnosis not present

## 2011-08-26 DIAGNOSIS — M6281 Muscle weakness (generalized): Secondary | ICD-10-CM | POA: Diagnosis not present

## 2011-08-27 NOTE — Progress Notes (Signed)
Agree with progress report from cardiac rehab on 5/302013 written by Trevor Mace and and Annamarie Dawley telemetry rhythm upon discharge from phase 2 cardiac rehab was atrial fibrillation.

## 2011-08-28 DIAGNOSIS — S72453A Displaced supracondylar fracture without intracondylar extension of lower end of unspecified femur, initial encounter for closed fracture: Secondary | ICD-10-CM | POA: Diagnosis not present

## 2011-08-28 DIAGNOSIS — M6281 Muscle weakness (generalized): Secondary | ICD-10-CM | POA: Diagnosis not present

## 2011-08-28 DIAGNOSIS — Z4789 Encounter for other orthopedic aftercare: Secondary | ICD-10-CM | POA: Diagnosis not present

## 2011-08-28 DIAGNOSIS — R269 Unspecified abnormalities of gait and mobility: Secondary | ICD-10-CM | POA: Diagnosis not present

## 2011-08-30 DIAGNOSIS — R269 Unspecified abnormalities of gait and mobility: Secondary | ICD-10-CM | POA: Diagnosis not present

## 2011-08-30 DIAGNOSIS — M6281 Muscle weakness (generalized): Secondary | ICD-10-CM | POA: Diagnosis not present

## 2011-08-30 DIAGNOSIS — Z4789 Encounter for other orthopedic aftercare: Secondary | ICD-10-CM | POA: Diagnosis not present

## 2011-08-30 DIAGNOSIS — S72453A Displaced supracondylar fracture without intracondylar extension of lower end of unspecified femur, initial encounter for closed fracture: Secondary | ICD-10-CM | POA: Diagnosis not present

## 2011-09-02 DIAGNOSIS — Z4789 Encounter for other orthopedic aftercare: Secondary | ICD-10-CM | POA: Diagnosis not present

## 2011-09-02 DIAGNOSIS — M6281 Muscle weakness (generalized): Secondary | ICD-10-CM | POA: Diagnosis not present

## 2011-09-02 DIAGNOSIS — S72453A Displaced supracondylar fracture without intracondylar extension of lower end of unspecified femur, initial encounter for closed fracture: Secondary | ICD-10-CM | POA: Diagnosis not present

## 2011-09-02 DIAGNOSIS — R269 Unspecified abnormalities of gait and mobility: Secondary | ICD-10-CM | POA: Diagnosis not present

## 2011-09-04 DIAGNOSIS — S72453A Displaced supracondylar fracture without intracondylar extension of lower end of unspecified femur, initial encounter for closed fracture: Secondary | ICD-10-CM | POA: Diagnosis not present

## 2011-09-04 DIAGNOSIS — M6281 Muscle weakness (generalized): Secondary | ICD-10-CM | POA: Diagnosis not present

## 2011-09-04 DIAGNOSIS — R269 Unspecified abnormalities of gait and mobility: Secondary | ICD-10-CM | POA: Diagnosis not present

## 2011-09-04 DIAGNOSIS — Z4789 Encounter for other orthopedic aftercare: Secondary | ICD-10-CM | POA: Diagnosis not present

## 2011-09-05 DIAGNOSIS — Z4789 Encounter for other orthopedic aftercare: Secondary | ICD-10-CM | POA: Diagnosis not present

## 2011-09-05 DIAGNOSIS — R269 Unspecified abnormalities of gait and mobility: Secondary | ICD-10-CM | POA: Diagnosis not present

## 2011-09-05 DIAGNOSIS — S72453A Displaced supracondylar fracture without intracondylar extension of lower end of unspecified femur, initial encounter for closed fracture: Secondary | ICD-10-CM | POA: Diagnosis not present

## 2011-09-05 DIAGNOSIS — M6281 Muscle weakness (generalized): Secondary | ICD-10-CM | POA: Diagnosis not present

## 2011-09-16 DIAGNOSIS — I10 Essential (primary) hypertension: Secondary | ICD-10-CM | POA: Diagnosis not present

## 2011-09-16 DIAGNOSIS — R05 Cough: Secondary | ICD-10-CM | POA: Diagnosis not present

## 2011-09-16 DIAGNOSIS — Z7901 Long term (current) use of anticoagulants: Secondary | ICD-10-CM | POA: Diagnosis not present

## 2011-09-16 DIAGNOSIS — J449 Chronic obstructive pulmonary disease, unspecified: Secondary | ICD-10-CM | POA: Diagnosis not present

## 2011-09-16 DIAGNOSIS — I2510 Atherosclerotic heart disease of native coronary artery without angina pectoris: Secondary | ICD-10-CM | POA: Diagnosis not present

## 2011-09-16 DIAGNOSIS — I4891 Unspecified atrial fibrillation: Secondary | ICD-10-CM | POA: Diagnosis not present

## 2011-09-20 DIAGNOSIS — I4891 Unspecified atrial fibrillation: Secondary | ICD-10-CM | POA: Diagnosis not present

## 2011-09-20 DIAGNOSIS — J449 Chronic obstructive pulmonary disease, unspecified: Secondary | ICD-10-CM | POA: Diagnosis not present

## 2011-09-20 DIAGNOSIS — I739 Peripheral vascular disease, unspecified: Secondary | ICD-10-CM | POA: Diagnosis not present

## 2011-09-20 DIAGNOSIS — Z7901 Long term (current) use of anticoagulants: Secondary | ICD-10-CM | POA: Diagnosis not present

## 2011-09-25 DIAGNOSIS — L570 Actinic keratosis: Secondary | ICD-10-CM | POA: Diagnosis not present

## 2011-09-25 DIAGNOSIS — D485 Neoplasm of uncertain behavior of skin: Secondary | ICD-10-CM | POA: Diagnosis not present

## 2011-09-25 DIAGNOSIS — I8310 Varicose veins of unspecified lower extremity with inflammation: Secondary | ICD-10-CM | POA: Diagnosis not present

## 2011-09-25 DIAGNOSIS — S72143A Displaced intertrochanteric fracture of unspecified femur, initial encounter for closed fracture: Secondary | ICD-10-CM | POA: Diagnosis not present

## 2011-10-08 DIAGNOSIS — Z79899 Other long term (current) drug therapy: Secondary | ICD-10-CM | POA: Diagnosis not present

## 2011-11-01 DIAGNOSIS — Z7901 Long term (current) use of anticoagulants: Secondary | ICD-10-CM | POA: Diagnosis not present

## 2011-11-01 DIAGNOSIS — I4891 Unspecified atrial fibrillation: Secondary | ICD-10-CM | POA: Diagnosis not present

## 2011-11-12 DIAGNOSIS — M25559 Pain in unspecified hip: Secondary | ICD-10-CM | POA: Diagnosis not present

## 2011-11-14 DIAGNOSIS — N401 Enlarged prostate with lower urinary tract symptoms: Secondary | ICD-10-CM | POA: Diagnosis not present

## 2011-11-14 DIAGNOSIS — D090 Carcinoma in situ of bladder: Secondary | ICD-10-CM | POA: Diagnosis not present

## 2011-11-16 DIAGNOSIS — M25559 Pain in unspecified hip: Secondary | ICD-10-CM | POA: Diagnosis not present

## 2011-11-19 DIAGNOSIS — M25559 Pain in unspecified hip: Secondary | ICD-10-CM | POA: Diagnosis not present

## 2011-11-27 DIAGNOSIS — Z7901 Long term (current) use of anticoagulants: Secondary | ICD-10-CM | POA: Diagnosis not present

## 2011-11-27 DIAGNOSIS — I739 Peripheral vascular disease, unspecified: Secondary | ICD-10-CM | POA: Diagnosis not present

## 2011-11-27 DIAGNOSIS — I4891 Unspecified atrial fibrillation: Secondary | ICD-10-CM | POA: Diagnosis not present

## 2011-11-27 DIAGNOSIS — J449 Chronic obstructive pulmonary disease, unspecified: Secondary | ICD-10-CM | POA: Diagnosis not present

## 2011-11-27 DIAGNOSIS — I061 Rheumatic aortic insufficiency: Secondary | ICD-10-CM | POA: Diagnosis not present

## 2011-12-04 DIAGNOSIS — M25559 Pain in unspecified hip: Secondary | ICD-10-CM | POA: Diagnosis not present

## 2011-12-23 DIAGNOSIS — I4891 Unspecified atrial fibrillation: Secondary | ICD-10-CM | POA: Diagnosis not present

## 2011-12-23 DIAGNOSIS — Z7901 Long term (current) use of anticoagulants: Secondary | ICD-10-CM | POA: Diagnosis not present

## 2011-12-30 DIAGNOSIS — E1159 Type 2 diabetes mellitus with other circulatory complications: Secondary | ICD-10-CM | POA: Diagnosis not present

## 2011-12-30 DIAGNOSIS — F17200 Nicotine dependence, unspecified, uncomplicated: Secondary | ICD-10-CM | POA: Diagnosis not present

## 2011-12-30 DIAGNOSIS — I4891 Unspecified atrial fibrillation: Secondary | ICD-10-CM | POA: Diagnosis not present

## 2011-12-30 DIAGNOSIS — I10 Essential (primary) hypertension: Secondary | ICD-10-CM | POA: Diagnosis not present

## 2012-01-02 DIAGNOSIS — I6529 Occlusion and stenosis of unspecified carotid artery: Secondary | ICD-10-CM | POA: Diagnosis not present

## 2012-01-20 DIAGNOSIS — Z7901 Long term (current) use of anticoagulants: Secondary | ICD-10-CM | POA: Diagnosis not present

## 2012-01-20 DIAGNOSIS — I4891 Unspecified atrial fibrillation: Secondary | ICD-10-CM | POA: Diagnosis not present

## 2012-01-29 DIAGNOSIS — M169 Osteoarthritis of hip, unspecified: Secondary | ICD-10-CM | POA: Diagnosis not present

## 2012-02-05 DIAGNOSIS — Z7901 Long term (current) use of anticoagulants: Secondary | ICD-10-CM | POA: Diagnosis not present

## 2012-02-05 DIAGNOSIS — I4891 Unspecified atrial fibrillation: Secondary | ICD-10-CM | POA: Diagnosis not present

## 2012-03-05 DIAGNOSIS — I4891 Unspecified atrial fibrillation: Secondary | ICD-10-CM | POA: Diagnosis not present

## 2012-03-05 DIAGNOSIS — Z7901 Long term (current) use of anticoagulants: Secondary | ICD-10-CM | POA: Diagnosis not present

## 2012-04-02 DIAGNOSIS — I4891 Unspecified atrial fibrillation: Secondary | ICD-10-CM | POA: Diagnosis not present

## 2012-04-02 DIAGNOSIS — Z7901 Long term (current) use of anticoagulants: Secondary | ICD-10-CM | POA: Diagnosis not present

## 2012-04-30 DIAGNOSIS — I4891 Unspecified atrial fibrillation: Secondary | ICD-10-CM | POA: Diagnosis not present

## 2012-05-12 DIAGNOSIS — D090 Carcinoma in situ of bladder: Secondary | ICD-10-CM | POA: Diagnosis not present

## 2012-05-14 ENCOUNTER — Ambulatory Visit: Payer: Self-pay | Admitting: Cardiovascular Disease

## 2012-05-18 ENCOUNTER — Other Ambulatory Visit: Payer: Self-pay | Admitting: Cardiovascular Disease

## 2012-05-18 ENCOUNTER — Ambulatory Visit (HOSPITAL_COMMUNITY)
Admission: RE | Admit: 2012-05-18 | Discharge: 2012-05-18 | Disposition: A | Discharge status: Home / Self Care | Payer: Medicare Other | Source: Ambulatory Visit | Attending: Cardiovascular Disease | Admitting: Cardiovascular Disease

## 2012-05-18 DIAGNOSIS — I2510 Atherosclerotic heart disease of native coronary artery without angina pectoris: Secondary | ICD-10-CM | POA: Insufficient documentation

## 2012-05-18 DIAGNOSIS — K862 Cyst of pancreas: Secondary | ICD-10-CM | POA: Insufficient documentation

## 2012-05-18 DIAGNOSIS — R0789 Other chest pain: Secondary | ICD-10-CM | POA: Diagnosis not present

## 2012-05-18 DIAGNOSIS — J90 Pleural effusion, not elsewhere classified: Secondary | ICD-10-CM | POA: Insufficient documentation

## 2012-05-18 DIAGNOSIS — I700 Atherosclerosis of aorta: Secondary | ICD-10-CM | POA: Diagnosis not present

## 2012-05-18 DIAGNOSIS — M8448XA Pathological fracture, other site, initial encounter for fracture: Secondary | ICD-10-CM | POA: Diagnosis not present

## 2012-05-18 DIAGNOSIS — K863 Pseudocyst of pancreas: Secondary | ICD-10-CM | POA: Insufficient documentation

## 2012-05-18 DIAGNOSIS — Z954 Presence of other heart-valve replacement: Secondary | ICD-10-CM | POA: Insufficient documentation

## 2012-05-18 DIAGNOSIS — E278 Other specified disorders of adrenal gland: Secondary | ICD-10-CM | POA: Diagnosis not present

## 2012-05-18 DIAGNOSIS — R1310 Dysphagia, unspecified: Secondary | ICD-10-CM

## 2012-05-18 LAB — CREATININE, SERUM
Creatinine, Ser: 1.13 mg/dL (ref 0.50–1.35)
GFR calc Af Amer: 65 mL/min — ABNORMAL LOW (ref >90)

## 2012-05-18 LAB — BUN: BUN: 19 mg/dL (ref 6–23)

## 2012-05-18 MED ORDER — IOHEXOL 350 MG/ML SOLN
100.0000 mL | Freq: Once | INTRAVENOUS | Status: AC | PRN
  Administered 2012-05-18: 100 mL via INTRAVENOUS

## 2012-05-20 ENCOUNTER — Ambulatory Visit: Payer: Medicare Other | Attending: Internal Medicine | Admitting: Physical Therapy

## 2012-05-20 DIAGNOSIS — IMO0001 Reserved for inherently not codable concepts without codable children: Secondary | ICD-10-CM | POA: Diagnosis not present

## 2012-05-20 DIAGNOSIS — M6281 Muscle weakness (generalized): Secondary | ICD-10-CM | POA: Insufficient documentation

## 2012-05-20 DIAGNOSIS — R269 Unspecified abnormalities of gait and mobility: Secondary | ICD-10-CM | POA: Diagnosis not present

## 2012-05-27 ENCOUNTER — Encounter: Payer: Self-pay | Admitting: Emergency Medicine

## 2012-05-27 ENCOUNTER — Ambulatory Visit: Payer: Medicare Other | Attending: Internal Medicine | Admitting: Physical Therapy

## 2012-05-27 ENCOUNTER — Ambulatory Visit (INDEPENDENT_AMBULATORY_CARE_PROVIDER_SITE_OTHER): Payer: Medicare Other | Admitting: Emergency Medicine

## 2012-05-27 VITALS — BP 130/70 | HR 56 | Temp 97.7°F | Ht 69.0 in | Wt 183.6 lb

## 2012-05-27 DIAGNOSIS — R269 Unspecified abnormalities of gait and mobility: Secondary | ICD-10-CM | POA: Insufficient documentation

## 2012-05-27 DIAGNOSIS — M6281 Muscle weakness (generalized): Secondary | ICD-10-CM | POA: Diagnosis not present

## 2012-05-27 DIAGNOSIS — R918 Other nonspecific abnormal finding of lung field: Secondary | ICD-10-CM

## 2012-05-27 DIAGNOSIS — J449 Chronic obstructive pulmonary disease, unspecified: Secondary | ICD-10-CM

## 2012-05-27 DIAGNOSIS — IMO0001 Reserved for inherently not codable concepts without codable children: Secondary | ICD-10-CM | POA: Diagnosis not present

## 2012-05-27 NOTE — Patient Instructions (Addendum)
Please increase your Advair to twice a day Rinse your mouth out after you take this inhaler We will repeat your CT scan of the chest in September 2014 Follow with Dr Delton Coombes in September after the scan. We will review the scan and talk about possible breathing tests at that time.

## 2012-05-27 NOTE — Assessment & Plan Note (Signed)
LUL GG nodule and RUL small spiculated lesion in a patient with high risk for lung CA, but who is also very high risk for any procedure (these lesions would likely need to be bx'd surgically). i would defer bx for now, plan to repeat Ct scan in 6 months to assess for interval change. Will arrange for repeat Ct scan today.

## 2012-05-27 NOTE — Assessment & Plan Note (Signed)
Suspect severe AFL, currently on Advair qd - increase Advair to bid - needs PFT - would likely benefit from addition of spiriva - follow in 6 months and consider spiriva at that time.

## 2012-05-27 NOTE — Progress Notes (Signed)
Subjective:    Patient ID: Margaret Pyle, male    DOB: 1922/08/30, 79 y.o.   MRN: [id number]  HPI 79 yo former smoker (> 100 pk-yrs, quit 2/'14) with COPD on Advair, AS s/p bio-AVR, HTN, PVD, DM2, A Fib, CKD, bladder CA. He was admitted Summer '13 for R hip fx. He underwent a CT-PA on 05/18/12 for mid-CP in the am on awakening x 3 days, felt like indigestion. He is referred by Dr Timothy Lasso to evaluate a 8x3 mm RUL nodule and some peripheral LUL 8mm GG nodule. His indigestion has resolved. Takes Advair qd, breathing is limited with heavy exertion but for the most part he in not SOB.    Review of Systems  Constitutional: Positive for appetite change. Negative for fever and unexpected weight change.  HENT: Positive for trouble swallowing. Negative for ear pain, nosebleeds, congestion, sore throat, rhinorrhea, sneezing, dental problem, postnasal drip and sinus pressure.   Eyes: Negative for redness and itching.  Respiratory: Positive for shortness of breath. Negative for cough, chest tightness and wheezing.   Cardiovascular: Positive for chest pain. Negative for palpitations and leg swelling.  Gastrointestinal: Negative for nausea and vomiting.  Genitourinary: Negative for dysuria.  Musculoskeletal: Negative for joint swelling.  Skin: Negative for rash.  Neurological: Negative for headaches.  Hematological: Does not bruise/bleed easily.  Psychiatric/Behavioral: Negative for dysphoric mood. The patient is not nervous/anxious.    Past Medical History  Diagnosis Date  . AS (aortic stenosis)   . HTN (hypertension)   . Heart murmur   . Thrombocytopenia due to drugs     seen by [name] plts 114000 no rx  . Peripheral vascular disease very poor circulation legs and feet ... stents right and left legs... done in [name] 's office.   . Depression wife died 4 years ago.    Marland Kitchen COPD (chronic obstructive pulmonary disease)   . Shortness of breath   . Recurrent upper respiratory infection (URI)      sinusitis  . Claudication in peripheral vascular disease 06/17/2011  . S/P angioplasty with stent, diamond back rotational athrectomy Prox. Rt. SFA 06/17/2011 06/17/2011  . Neuropathy due to secondary diabetes   . Type II diabetes mellitus   . Pneumonia 07/25/11    left  . Dysrhythmia 5-7 weeks ago ... sees dr Timothy Lasso    atrial fibrilation  . Bundle branch block, left   . Rapid atrial fibrillation 07/25/11  . Blood transfusion     w/hip operation  . History of stomach ulcers ~ 1951  . Bladder cancer     Bladder Cancer local  . Chronic kidney disease     renal artery stent     Family History  Problem Relation Age of Onset  . Heart disease Mother   . Cancer Brother     colon  . Stroke Father      History   Social History  . Marital Status: Widowed    Spouse Name: N/A    Number of Children: 3  . Years of Education: N/A   Occupational History  . sales    Social History Main Topics  . Smoking status: Former Smoker -- 2.00 packs/day for 75 years    Types: Cigarettes    Quit date: 03/28/2012  . Smokeless tobacco: Never Used  . Alcohol Use: 1.2 oz/week    2 Cans of beer per week  . Drug Use: No  . Sexually Active: No   Other Topics Concern  . Not  on file   Social History Narrative  . No narrative on file     Allergies  Allergen Reactions  . Aspirin Anaphylaxis, Shortness Of Breath and Rash    "broke out in white welts; red blotches neck and face; windpipe closing; ~ 1962"     Outpatient Prescriptions Prior to Visit  Medication Sig Dispense Refill  . acetaminophen (TYLENOL) 325 MG tablet Take 650 mg by mouth every 6 (six) hours as needed. For pain      . benazepril (LOTENSIN) 20 MG tablet Take 20 mg by mouth daily.      . clopidogrel (PLAVIX) 75 MG tablet Take 75 mg by mouth daily.      Marland Kitchen diltiazem (DILACOR XR) 240 MG 24 hr capsule Take 180 mg by mouth daily.       Marland Kitchen doxazosin (CARDURA) 4 MG tablet Take 4 mg by mouth daily.       . finasteride (PROSCAR) 5 MG  tablet Take 5 mg by mouth daily.        . Fluticasone-Salmeterol (ADVAIR) 250-50 MCG/DOSE AEPB Inhale 1 puff into the lungs every 12 (twelve) hours.      . furosemide (LASIX) 20 MG tablet Take 20 mg by mouth daily.        Marland Kitchen glyBURIDE (DIABETA) 5 MG tablet Take 5-10 mg by mouth See admin instructions. 2 tablets in the morning and 1 tablet in the evening      . metoprolol (LOPRESSOR) 50 MG tablet Take 50 mg by mouth 2 (two) times daily.      . polyethylene glycol (MIRALAX / GLYCOLAX) packet Take 17 g by mouth 2 (two) times daily.      . potassium chloride SA (K-DUR,KLOR-CON) 20 MEQ tablet Take 20 mEq by mouth daily.      . simvastatin (ZOCOR) 10 MG tablet Take 20 mg by mouth at bedtime.       Marland Kitchen warfarin (COUMADIN) 3 MG tablet Take 3 mg by mouth every other day.      . warfarin (COUMADIN) 4 MG tablet Take 4 mg by mouth every other day.      . insulin glargine (LANTUS) 100 UNIT/ML injection 35 units HS.  Titrate up and down based on CBG needs.  Hopefully as Steroids are weaned his insulin requirements will diminish.  10 mL    . HYDROcodone-acetaminophen (NORCO) 5-325 MG per tablet Take 1 tablet by mouth every 8 (eight) hours. Takes at 0600, 1400, 2200      . loratadine (CLARITIN) 10 MG tablet Take 10 mg by mouth daily.      Marland Kitchen LORazepam (ATIVAN) 0.5 MG tablet Take 0.5 mg by mouth at bedtime.      . insulin aspart (NOVOLOG) 100 UNIT/ML injection Check CBGs 4 times per day with meals and AC -  and follow ISS CBG < 70: implement hypoglycemia protocol CBG 70 - 120: 0 units CBG 121 - 150: 2 units CBG 151 - 200: 3 units CBG 201 - 250: 5 units CBG 251 - 300: 8 units CBG 301 - 350: 11 units CBG 351 - 400: 15 units  1 vial     No facility-administered medications prior to visit.       Objective:   Physical Exam Filed Vitals:   05/27/12 1446  BP: 130/70  Pulse: 56  Temp: 97.7 F (36.5 C)   Gen: Pleasant, well-nourished, in no distress,  normal affect  ENT: No lesions,  mouth clear,   oropharynx clear, no postnasal drip  Neck: No JVD, no TMG, no carotid bruits  Lungs: No use of accessory muscles, no dullness to percussion, clear without rales or rhonchi  Cardiovascular: RRR, heart sounds normal, no murmur or gallops, no peripheral edema  Abdomen: soft and NT, no HSM,  BS normal  Musculoskeletal: No deformities, no cyanosis or clubbing  Neuro: alert, non focal  Skin: Warm, no lesions or rashes    CT Chest 05/18/12 --  Comparison: CT thoracic spine 01/15/2006  Findings: Precontrast images demonstrate no evidence of intramural  hematoma. Atherosclerotic calcifications are present in the  ascending aorta, aortic arch, descending aorta, great vessels, left  main coronary, circumflex coronary, left anterior descending  coronary, and right coronary arteries.  Ectasia of the right subclavian artery measures 15 mm in caliber.  Prosthetic aortic valve is in place. Mitral valve and mitral  annular calcifications are noted.  There is no evidence of aortic dissection, aneurysm, or  transection. Mild plaque along the aortic arch and descending  aorta is present. Left atrial appendage clip is in place.  No pericardial effusion. Small right pleural effusion. No  pneumothorax.  Negative abnormal mediastinal or hilar adenopathy. Small nodes are  scattered in the mediastinum. None are greater than 1 cm short  axis diameter.  Bilateral thyroid nodules are suspected.  Moderate centrilobular emphysema in the lungs.  8 x 3 mm spiculated density in the right upper lobe on image 47.  Focal sub centimeter ground-glass opacity in the lateral left upper  lobe on image 28.  Chronic T12 compression fracture. No acute bony deformity.  Left adrenal gland is prominent in a hyperplasia pattern.  Innumerable gastrohepatic ligament and epigastric lymph nodes are  present. None are greater than 1 cm short axis diameter.  1.5 cm cyst is present in the tail of the pancreas. Indeterminate   1.0 cm hypodensity in the body of the pancreas on image 155 of  series 5.  IMPRESSION:  No evidence of aortic dissection. Atherosclerotic changes are  noted. Prosthetic aortic valve is in place without complication.  Small right pleural effusion.  8 mm spiculated nodule in the right upper lobe. Malignancy is not  excluded. If the patient is at high risk for bronchogenic  carcinoma, follow-up chest CT at 3-6 months is recommended. If the  patient is at low risk for bronchogenic carcinoma, follow-up chest  CT at 6-12 months is recommended      Assessment & Plan:  COPD (chronic obstructive pulmonary disease) Suspect severe AFL, currently on Advair qd - increase Advair to bid - needs PFT - would likely benefit from addition of spiriva - follow in 6 months and consider spiriva at that time.   Pulmonary nodules LUL GG nodule and RUL small spiculated lesion in a patient with high risk for lung CA, but who is also very high risk for any procedure (these lesions would likely need to be bx'd surgically). i would defer bx for now, plan to repeat Ct scan in 6 months to assess for interval change. Will arrange for repeat Ct scan today.

## 2012-05-28 DIAGNOSIS — I4891 Unspecified atrial fibrillation: Secondary | ICD-10-CM | POA: Diagnosis not present

## 2012-05-28 DIAGNOSIS — Z7901 Long term (current) use of anticoagulants: Secondary | ICD-10-CM | POA: Diagnosis not present

## 2012-05-29 ENCOUNTER — Ambulatory Visit: Payer: Medicare Other | Admitting: Physical Therapy

## 2012-05-29 DIAGNOSIS — M6281 Muscle weakness (generalized): Secondary | ICD-10-CM | POA: Diagnosis not present

## 2012-05-29 DIAGNOSIS — IMO0001 Reserved for inherently not codable concepts without codable children: Secondary | ICD-10-CM | POA: Diagnosis not present

## 2012-05-29 DIAGNOSIS — R269 Unspecified abnormalities of gait and mobility: Secondary | ICD-10-CM | POA: Diagnosis not present

## 2012-06-03 ENCOUNTER — Ambulatory Visit: Payer: Medicare Other | Admitting: Physical Therapy

## 2012-06-03 DIAGNOSIS — IMO0001 Reserved for inherently not codable concepts without codable children: Secondary | ICD-10-CM | POA: Diagnosis not present

## 2012-06-03 DIAGNOSIS — M6281 Muscle weakness (generalized): Secondary | ICD-10-CM | POA: Diagnosis not present

## 2012-06-03 DIAGNOSIS — R269 Unspecified abnormalities of gait and mobility: Secondary | ICD-10-CM | POA: Diagnosis not present

## 2012-06-05 ENCOUNTER — Ambulatory Visit: Payer: Medicare Other | Admitting: Physical Therapy

## 2012-06-05 DIAGNOSIS — M6281 Muscle weakness (generalized): Secondary | ICD-10-CM | POA: Diagnosis not present

## 2012-06-05 DIAGNOSIS — IMO0001 Reserved for inherently not codable concepts without codable children: Secondary | ICD-10-CM | POA: Diagnosis not present

## 2012-06-05 DIAGNOSIS — R269 Unspecified abnormalities of gait and mobility: Secondary | ICD-10-CM | POA: Diagnosis not present

## 2012-06-08 ENCOUNTER — Ambulatory Visit: Payer: Medicare Other | Admitting: Physical Therapy

## 2012-06-08 DIAGNOSIS — M6281 Muscle weakness (generalized): Secondary | ICD-10-CM | POA: Diagnosis not present

## 2012-06-08 DIAGNOSIS — IMO0001 Reserved for inherently not codable concepts without codable children: Secondary | ICD-10-CM | POA: Diagnosis not present

## 2012-06-08 DIAGNOSIS — R269 Unspecified abnormalities of gait and mobility: Secondary | ICD-10-CM | POA: Diagnosis not present

## 2012-06-10 ENCOUNTER — Ambulatory Visit: Payer: Medicare Other | Admitting: Physical Therapy

## 2012-06-10 DIAGNOSIS — R269 Unspecified abnormalities of gait and mobility: Secondary | ICD-10-CM | POA: Diagnosis not present

## 2012-06-10 DIAGNOSIS — M6281 Muscle weakness (generalized): Secondary | ICD-10-CM | POA: Diagnosis not present

## 2012-06-10 DIAGNOSIS — IMO0001 Reserved for inherently not codable concepts without codable children: Secondary | ICD-10-CM | POA: Diagnosis not present

## 2012-06-15 ENCOUNTER — Encounter: Payer: Self-pay | Admitting: Physical Therapy

## 2012-06-15 ENCOUNTER — Ambulatory Visit: Payer: Medicare Other | Admitting: Physical Therapy

## 2012-06-15 DIAGNOSIS — M6281 Muscle weakness (generalized): Secondary | ICD-10-CM | POA: Diagnosis not present

## 2012-06-15 DIAGNOSIS — IMO0001 Reserved for inherently not codable concepts without codable children: Secondary | ICD-10-CM | POA: Diagnosis not present

## 2012-06-15 DIAGNOSIS — R269 Unspecified abnormalities of gait and mobility: Secondary | ICD-10-CM | POA: Diagnosis not present

## 2012-06-17 ENCOUNTER — Ambulatory Visit: Payer: Medicare Other | Admitting: Physical Therapy

## 2012-06-17 DIAGNOSIS — R269 Unspecified abnormalities of gait and mobility: Secondary | ICD-10-CM | POA: Diagnosis not present

## 2012-06-17 DIAGNOSIS — IMO0001 Reserved for inherently not codable concepts without codable children: Secondary | ICD-10-CM | POA: Diagnosis not present

## 2012-06-17 DIAGNOSIS — M6281 Muscle weakness (generalized): Secondary | ICD-10-CM | POA: Diagnosis not present

## 2012-06-23 ENCOUNTER — Ambulatory Visit: Payer: Medicare Other | Admitting: Physical Therapy

## 2012-06-23 DIAGNOSIS — IMO0001 Reserved for inherently not codable concepts without codable children: Secondary | ICD-10-CM | POA: Diagnosis not present

## 2012-06-23 DIAGNOSIS — M6281 Muscle weakness (generalized): Secondary | ICD-10-CM | POA: Diagnosis not present

## 2012-06-23 DIAGNOSIS — R269 Unspecified abnormalities of gait and mobility: Secondary | ICD-10-CM | POA: Diagnosis not present

## 2012-06-24 DIAGNOSIS — Z7901 Long term (current) use of anticoagulants: Secondary | ICD-10-CM | POA: Diagnosis not present

## 2012-06-24 DIAGNOSIS — I6529 Occlusion and stenosis of unspecified carotid artery: Secondary | ICD-10-CM | POA: Diagnosis not present

## 2012-06-24 DIAGNOSIS — I4891 Unspecified atrial fibrillation: Secondary | ICD-10-CM | POA: Diagnosis not present

## 2012-06-25 ENCOUNTER — Other Ambulatory Visit (HOSPITAL_COMMUNITY): Payer: Self-pay | Admitting: Cardiovascular Disease

## 2012-06-25 ENCOUNTER — Ambulatory Visit (HOSPITAL_COMMUNITY): Payer: Medicare Other

## 2012-06-25 DIAGNOSIS — Z952 Presence of prosthetic heart valve: Secondary | ICD-10-CM

## 2012-06-26 ENCOUNTER — Ambulatory Visit: Payer: Medicare Other | Attending: Internal Medicine | Admitting: Rehabilitative and Restorative Service Providers"

## 2012-06-26 DIAGNOSIS — R269 Unspecified abnormalities of gait and mobility: Secondary | ICD-10-CM | POA: Diagnosis not present

## 2012-06-26 DIAGNOSIS — IMO0001 Reserved for inherently not codable concepts without codable children: Secondary | ICD-10-CM | POA: Diagnosis not present

## 2012-06-26 DIAGNOSIS — M6281 Muscle weakness (generalized): Secondary | ICD-10-CM | POA: Insufficient documentation

## 2012-06-29 ENCOUNTER — Ambulatory Visit: Payer: Medicare Other | Admitting: Physical Therapy

## 2012-06-29 DIAGNOSIS — M6281 Muscle weakness (generalized): Secondary | ICD-10-CM | POA: Diagnosis not present

## 2012-06-29 DIAGNOSIS — R269 Unspecified abnormalities of gait and mobility: Secondary | ICD-10-CM | POA: Diagnosis not present

## 2012-06-29 DIAGNOSIS — IMO0001 Reserved for inherently not codable concepts without codable children: Secondary | ICD-10-CM | POA: Diagnosis not present

## 2012-06-30 ENCOUNTER — Other Ambulatory Visit (HOSPITAL_COMMUNITY): Payer: Self-pay | Admitting: Cardiovascular Disease

## 2012-06-30 DIAGNOSIS — R0989 Other specified symptoms and signs involving the circulatory and respiratory systems: Secondary | ICD-10-CM

## 2012-07-01 ENCOUNTER — Ambulatory Visit: Payer: Medicare Other | Admitting: *Deleted

## 2012-07-01 DIAGNOSIS — IMO0001 Reserved for inherently not codable concepts without codable children: Secondary | ICD-10-CM | POA: Diagnosis not present

## 2012-07-01 DIAGNOSIS — M6281 Muscle weakness (generalized): Secondary | ICD-10-CM | POA: Diagnosis not present

## 2012-07-01 DIAGNOSIS — R269 Unspecified abnormalities of gait and mobility: Secondary | ICD-10-CM | POA: Diagnosis not present

## 2012-07-02 ENCOUNTER — Ambulatory Visit (HOSPITAL_COMMUNITY)
Admission: RE | Admit: 2012-07-02 | Discharge: 2012-07-02 | Disposition: A | Discharge status: Home / Self Care | Payer: Medicare Other | Source: Ambulatory Visit | Attending: Cardiovascular Disease | Admitting: Cardiovascular Disease

## 2012-07-02 DIAGNOSIS — I079 Rheumatic tricuspid valve disease, unspecified: Secondary | ICD-10-CM | POA: Insufficient documentation

## 2012-07-02 DIAGNOSIS — I379 Nonrheumatic pulmonary valve disorder, unspecified: Secondary | ICD-10-CM | POA: Diagnosis not present

## 2012-07-02 DIAGNOSIS — T82897A Other specified complication of cardiac prosthetic devices, implants and grafts, initial encounter: Secondary | ICD-10-CM | POA: Diagnosis not present

## 2012-07-02 DIAGNOSIS — I059 Rheumatic mitral valve disease, unspecified: Secondary | ICD-10-CM | POA: Diagnosis not present

## 2012-07-02 DIAGNOSIS — Z954 Presence of other heart-valve replacement: Secondary | ICD-10-CM | POA: Diagnosis not present

## 2012-07-02 DIAGNOSIS — I359 Nonrheumatic aortic valve disorder, unspecified: Secondary | ICD-10-CM | POA: Diagnosis not present

## 2012-07-02 DIAGNOSIS — Z952 Presence of prosthetic heart valve: Secondary | ICD-10-CM

## 2012-07-02 DIAGNOSIS — Y831 Surgical operation with implant of artificial internal device as the cause of abnormal reaction of the patient, or of later complication, without mention of misadventure at the time of the procedure: Secondary | ICD-10-CM | POA: Insufficient documentation

## 2012-07-02 NOTE — Progress Notes (Signed)
2D Echo Performed 07/02/2012    Yliana Gravois, RCS  

## 2012-07-06 ENCOUNTER — Ambulatory Visit: Payer: Medicare Other | Admitting: Physical Therapy

## 2012-07-06 ENCOUNTER — Inpatient Hospital Stay (HOSPITAL_COMMUNITY)
Admission: EM | Admit: 2012-07-06 | Discharge: 2012-07-08 | DRG: 293 | Disposition: A | Discharge status: Home Health | Payer: Medicare Other | Attending: Cardiovascular Disease | Admitting: Cardiovascular Disease

## 2012-07-06 ENCOUNTER — Emergency Department (HOSPITAL_COMMUNITY): Payer: Medicare Other

## 2012-07-06 ENCOUNTER — Encounter (HOSPITAL_COMMUNITY): Payer: Self-pay

## 2012-07-06 DIAGNOSIS — Z7901 Long term (current) use of anticoagulants: Secondary | ICD-10-CM

## 2012-07-06 DIAGNOSIS — I359 Nonrheumatic aortic valve disorder, unspecified: Secondary | ICD-10-CM | POA: Diagnosis present

## 2012-07-06 DIAGNOSIS — I498 Other specified cardiac arrhythmias: Secondary | ICD-10-CM | POA: Diagnosis present

## 2012-07-06 DIAGNOSIS — Z87891 Personal history of nicotine dependence: Secondary | ICD-10-CM | POA: Diagnosis not present

## 2012-07-06 DIAGNOSIS — N189 Chronic kidney disease, unspecified: Secondary | ICD-10-CM | POA: Diagnosis present

## 2012-07-06 DIAGNOSIS — J4489 Other specified chronic obstructive pulmonary disease: Secondary | ICD-10-CM | POA: Diagnosis present

## 2012-07-06 DIAGNOSIS — Z8551 Personal history of malignant neoplasm of bladder: Secondary | ICD-10-CM | POA: Diagnosis not present

## 2012-07-06 DIAGNOSIS — Z794 Long term (current) use of insulin: Secondary | ICD-10-CM | POA: Diagnosis not present

## 2012-07-06 DIAGNOSIS — E1142 Type 2 diabetes mellitus with diabetic polyneuropathy: Secondary | ICD-10-CM | POA: Diagnosis present

## 2012-07-06 DIAGNOSIS — Z800 Family history of malignant neoplasm of digestive organs: Secondary | ICD-10-CM

## 2012-07-06 DIAGNOSIS — Z954 Presence of other heart-valve replacement: Secondary | ICD-10-CM

## 2012-07-06 DIAGNOSIS — E785 Hyperlipidemia, unspecified: Secondary | ICD-10-CM | POA: Diagnosis present

## 2012-07-06 DIAGNOSIS — I5031 Acute diastolic (congestive) heart failure: Secondary | ICD-10-CM | POA: Diagnosis not present

## 2012-07-06 DIAGNOSIS — F3289 Other specified depressive episodes: Secondary | ICD-10-CM | POA: Diagnosis present

## 2012-07-06 DIAGNOSIS — I509 Heart failure, unspecified: Secondary | ICD-10-CM | POA: Diagnosis not present

## 2012-07-06 DIAGNOSIS — I5033 Acute on chronic diastolic (congestive) heart failure: Secondary | ICD-10-CM | POA: Diagnosis present

## 2012-07-06 DIAGNOSIS — J90 Pleural effusion, not elsewhere classified: Secondary | ICD-10-CM | POA: Diagnosis not present

## 2012-07-06 DIAGNOSIS — Z823 Family history of stroke: Secondary | ICD-10-CM

## 2012-07-06 DIAGNOSIS — I129 Hypertensive chronic kidney disease with stage 1 through stage 4 chronic kidney disease, or unspecified chronic kidney disease: Secondary | ICD-10-CM | POA: Diagnosis present

## 2012-07-06 DIAGNOSIS — R079 Chest pain, unspecified: Secondary | ICD-10-CM

## 2012-07-06 DIAGNOSIS — Z8249 Family history of ischemic heart disease and other diseases of the circulatory system: Secondary | ICD-10-CM

## 2012-07-06 DIAGNOSIS — Z79899 Other long term (current) drug therapy: Secondary | ICD-10-CM | POA: Diagnosis not present

## 2012-07-06 DIAGNOSIS — F329 Major depressive disorder, single episode, unspecified: Secondary | ICD-10-CM | POA: Diagnosis present

## 2012-07-06 DIAGNOSIS — I10 Essential (primary) hypertension: Secondary | ICD-10-CM | POA: Diagnosis present

## 2012-07-06 DIAGNOSIS — E1149 Type 2 diabetes mellitus with other diabetic neurological complication: Secondary | ICD-10-CM | POA: Diagnosis present

## 2012-07-06 DIAGNOSIS — I739 Peripheral vascular disease, unspecified: Secondary | ICD-10-CM | POA: Diagnosis present

## 2012-07-06 DIAGNOSIS — IMO0001 Reserved for inherently not codable concepts without codable children: Secondary | ICD-10-CM

## 2012-07-06 DIAGNOSIS — R0602 Shortness of breath: Secondary | ICD-10-CM | POA: Diagnosis not present

## 2012-07-06 DIAGNOSIS — E118 Type 2 diabetes mellitus with unspecified complications: Secondary | ICD-10-CM | POA: Diagnosis present

## 2012-07-06 DIAGNOSIS — I4891 Unspecified atrial fibrillation: Secondary | ICD-10-CM | POA: Diagnosis present

## 2012-07-06 DIAGNOSIS — Z952 Presence of prosthetic heart valve: Secondary | ICD-10-CM

## 2012-07-06 DIAGNOSIS — J449 Chronic obstructive pulmonary disease, unspecified: Secondary | ICD-10-CM | POA: Diagnosis present

## 2012-07-06 DIAGNOSIS — R0789 Other chest pain: Secondary | ICD-10-CM | POA: Diagnosis not present

## 2012-07-06 LAB — CBC
HCT: 33 % — ABNORMAL LOW (ref 39.0–52.0)
MCV: 89.9 fL (ref 78.0–100.0)
RBC: 3.67 MIL/uL — ABNORMAL LOW (ref 4.22–5.81)
RDW: 16.5 % — ABNORMAL HIGH (ref 11.5–15.5)
WBC: 7 10*3/uL (ref 4.0–10.5)

## 2012-07-06 LAB — BASIC METABOLIC PANEL
BUN: 30 mg/dL — ABNORMAL HIGH (ref 6–23)
CO2: 25 mEq/L (ref 19–32)
Chloride: 106 mEq/L (ref 96–112)
GFR calc Af Amer: 48 mL/min — ABNORMAL LOW (ref >90)
Potassium: 3.6 mEq/L (ref 3.5–5.1)

## 2012-07-06 LAB — PRO B NATRIURETIC PEPTIDE: Pro B Natriuretic peptide (BNP): 1003 pg/mL — ABNORMAL HIGH (ref 0–450)

## 2012-07-06 LAB — POCT I-STAT TROPONIN I

## 2012-07-06 LAB — GLUCOSE, CAPILLARY: Glucose-Capillary: 157 mg/dL — ABNORMAL HIGH (ref 70–99)

## 2012-07-06 LAB — PROTIME-INR
INR: 2.04 — ABNORMAL HIGH (ref 0.00–1.49)
Prothrombin Time: 22.2 seconds — ABNORMAL HIGH (ref 11.6–15.2)

## 2012-07-06 MED ORDER — POTASSIUM CHLORIDE CRYS ER 20 MEQ PO TBCR
20.0000 meq | EXTENDED_RELEASE_TABLET | Freq: Every day | ORAL | Status: DC
  Administered 2012-07-06 – 2012-07-08 (×3): 20 meq via ORAL
  Filled 2012-07-06 (×3): qty 1

## 2012-07-06 MED ORDER — BENAZEPRIL HCL 10 MG PO TABS
10.0000 mg | ORAL_TABLET | Freq: Every day | ORAL | Status: DC
  Administered 2012-07-07 – 2012-07-08 (×2): 10 mg via ORAL
  Filled 2012-07-06 (×2): qty 1

## 2012-07-06 MED ORDER — SODIUM CHLORIDE 0.9 % IV SOLN: 250.0000 mL | INTRAVENOUS | Status: DC | PRN

## 2012-07-06 MED ORDER — WARFARIN - PHARMACIST DOSING INPATIENT: Freq: Every day | Status: DC

## 2012-07-06 MED ORDER — ONDANSETRON HCL 4 MG/2ML IJ SOLN: 4.0000 mg | Freq: Four times a day (QID) | INTRAMUSCULAR | Status: DC | PRN

## 2012-07-06 MED ORDER — WARFARIN SODIUM 2.5 MG PO TABS
2.5000 mg | ORAL_TABLET | Freq: Once | ORAL | Status: AC
  Administered 2012-07-06: 2.5 mg via ORAL
  Filled 2012-07-06: qty 1

## 2012-07-06 MED ORDER — DOXAZOSIN MESYLATE 4 MG PO TABS
4.0000 mg | ORAL_TABLET | Freq: Every day | ORAL | Status: DC
  Administered 2012-07-06 – 2012-07-08 (×3): 4 mg via ORAL
  Filled 2012-07-06 (×3): qty 1

## 2012-07-06 MED ORDER — METOPROLOL TARTRATE 50 MG PO TABS
50.0000 mg | ORAL_TABLET | Freq: Two times a day (BID) | ORAL | Status: DC
  Administered 2012-07-06 – 2012-07-08 (×4): 50 mg via ORAL
  Filled 2012-07-06 (×5): qty 1

## 2012-07-06 MED ORDER — LORATADINE 10 MG PO TABS
10.0000 mg | ORAL_TABLET | Freq: Every day | ORAL | Status: DC
  Administered 2012-07-07 – 2012-07-08 (×2): 10 mg via ORAL
  Filled 2012-07-06 (×2): qty 1

## 2012-07-06 MED ORDER — GLYBURIDE 2.5 MG PO TABS: 2.5000 mg | ORAL_TABLET | Freq: Two times a day (BID) | ORAL | Status: DC

## 2012-07-06 MED ORDER — SIMVASTATIN 20 MG PO TABS
20.0000 mg | ORAL_TABLET | Freq: Every evening | ORAL | Status: DC
  Administered 2012-07-06 – 2012-07-07 (×2): 20 mg via ORAL
  Filled 2012-07-06 (×3): qty 1

## 2012-07-06 MED ORDER — GLYBURIDE 2.5 MG PO TABS
2.5000 mg | ORAL_TABLET | Freq: Every day | ORAL | Status: DC
  Administered 2012-07-07: 2.5 mg via ORAL
  Filled 2012-07-06 (×2): qty 1

## 2012-07-06 MED ORDER — FUROSEMIDE 10 MG/ML IJ SOLN
40.0000 mg | Freq: Two times a day (BID) | INTRAMUSCULAR | Status: DC
  Administered 2012-07-07 – 2012-07-08 (×3): 40 mg via INTRAVENOUS
  Filled 2012-07-06 (×5): qty 4

## 2012-07-06 MED ORDER — MOMETASONE FURO-FORMOTEROL FUM 100-5 MCG/ACT IN AERO
2.0000 | INHALATION_SPRAY | Freq: Two times a day (BID) | RESPIRATORY_TRACT | Status: DC
  Administered 2012-07-07 – 2012-07-08 (×3): 2 via RESPIRATORY_TRACT
  Filled 2012-07-06: qty 8.8

## 2012-07-06 MED ORDER — FINASTERIDE 5 MG PO TABS
5.0000 mg | ORAL_TABLET | Freq: Every day | ORAL | Status: DC
  Administered 2012-07-06 – 2012-07-07 (×2): 5 mg via ORAL
  Filled 2012-07-06 (×3): qty 1

## 2012-07-06 MED ORDER — FUROSEMIDE 10 MG/ML IJ SOLN
40.0000 mg | Freq: Once | INTRAMUSCULAR | Status: AC
  Administered 2012-07-06: 40 mg via INTRAVENOUS
  Filled 2012-07-06: qty 4

## 2012-07-06 MED ORDER — GLYBURIDE 5 MG PO TABS
5.0000 mg | ORAL_TABLET | Freq: Every day | ORAL | Status: DC
  Administered 2012-07-07 – 2012-07-08 (×2): 5 mg via ORAL
  Filled 2012-07-06 (×3): qty 1

## 2012-07-06 MED ORDER — INSULIN GLARGINE 100 UNIT/ML ~~LOC~~ SOLN
10.0000 [IU] | Freq: Every day | SUBCUTANEOUS | Status: DC
  Administered 2012-07-06 – 2012-07-07 (×2): 10 [IU] via SUBCUTANEOUS
  Filled 2012-07-06 (×3): qty 0.1

## 2012-07-06 MED ORDER — SODIUM CHLORIDE 0.9 % IJ SOLN: 3.0000 mL | INTRAMUSCULAR | Status: DC | PRN

## 2012-07-06 MED ORDER — SODIUM CHLORIDE 0.9 % IJ SOLN
3.0000 mL | Freq: Two times a day (BID) | INTRAMUSCULAR | Status: DC
  Administered 2012-07-06 – 2012-07-08 (×4): 3 mL via INTRAVENOUS

## 2012-07-06 MED ORDER — ACETAMINOPHEN 325 MG PO TABS: 650.0000 mg | ORAL_TABLET | ORAL | Status: DC | PRN

## 2012-07-06 NOTE — ED Notes (Signed)
Pt reports sudden onset of Left side chest pain, SOB, and weakness starting today. Pt was sitting at his desk with the symptoms occurred. Pt reports increase weakness and SOB with exertion, pt also experienced some nuasea last night.

## 2012-07-06 NOTE — ED Provider Notes (Signed)
History     CSN: [id number]  Arrival date & time 07/06/12  1608   First MD Initiated Contact with Patient 07/06/12 1732      Chief Complaint  Patient presents with  . Chest Pain  . Shortness of Breath    (Consider location/radiation/quality/duration/timing/severity/associated sxs/prior treatment) HPI Comments: Patient with a history of atrial fibrillation and aortic valve replacement with a bovine valve in 2012 presents with chest pain. He also has a history of hypertension hyperlipidemia and diabetes. He is on Coumadin for atrial fibrillation. He's had some increased shortness of breath over last couple days. He says he had a hard time sleeping last night. He's had some orthopnea. He has some chronic mild edema in his legs but says this is unchanged from his baseline. Today he had some discomfort in the left side of his chest. He describes as an aching. It is nonradiating. He denies any diaphoresis or nausea. He had a cardiac catheterization in 2012 with his valve replacement which showed normal coronary arteries at that point. He actually just had an echocardiogram in the office of Dr. Allyson Sabal about 2 weeks ago which showed normal ejection fraction   Past Medical History  Diagnosis Date  . AS (aortic stenosis)   . HTN (hypertension)   . Heart murmur   . Thrombocytopenia due to drugs     seen by [name] plts [id number] no rx  . Peripheral vascular disease very poor circulation legs and feet ... stents right and left legs... done in [name] 's office.   . Depression wife died 4 years ago.    Marland Kitchen COPD (chronic obstructive pulmonary disease)   . Shortness of breath   . Recurrent upper respiratory infection (URI)     sinusitis  . Claudication in peripheral vascular disease 06/17/2011  . S/P angioplasty with stent, diamond back rotational athrectomy Prox. Rt. SFA 06/17/2011 06/17/2011  . Neuropathy due to secondary diabetes   . Type II diabetes mellitus   . Pneumonia 07/25/11    left  .  Dysrhythmia 5-7 weeks ago ... sees dr Timothy Lasso    atrial fibrilation  . Bundle branch block, left   . Rapid atrial fibrillation 07/25/11  . Blood transfusion     w/hip operation  . History of stomach ulcers ~ 1951  . Bladder cancer     Bladder Cancer local  . Chronic kidney disease     renal artery stent    Past Surgical History  Procedure Laterality Date  . Peripheral arterial stent graft      2006 left anf right illiac stents Dr Orson Slick  . Aortic valve replacement  01/07/2011    Procedure: AORTIC VALVE REPLACEMENT (AVR);  Surgeon: Delight Ovens, MD;  Location: [facility] OR;  Service: Open Heart Surgery;  Laterality: N/A;  . Cardioversion  04/03/2011    Procedure: CARDIOVERSION;  Surgeon: Marykay Lex, MD;  Location: [facility] OR;  Service: Cardiovascular;  Laterality: N/A;  . Atherectomy  06/17/2011    balloon atherectomy  . Femur im nail  06/27/2011    Procedure: INTRAMEDULLARY (IM) NAIL FEMORAL;  Surgeon: Senaida Lange, MD;  Location: [facility] ORS;  Service: Orthopedics;  Laterality: Right;  . Tonsillectomy and adenoidectomy      "when I was a kid"  . Cholecystectomy    . Cardiac valve replacement  12/2010    AVR  . Cataract extraction w/ intraocular lens  implant, bilateral  ~ 2007  . Angioplasty / stenting femoral  05/2011  RLE  . Renal artery stent  2006    "I believe"    Family History  Problem Relation Age of Onset  . Heart disease Mother   . Cancer Brother     colon  . Stroke Father     History  Substance Use Topics  . Smoking status: Former Smoker -- 2.00 packs/day for 75 years    Types: Cigarettes    Quit date: 03/28/2012  . Smokeless tobacco: Never Used  . Alcohol Use: 1.2 oz/week    2 Cans of beer per week      Review of Systems  Constitutional: Positive for fatigue. Negative for fever, chills and diaphoresis.  HENT: Negative for congestion, rhinorrhea and sneezing.   Eyes: Negative.   Respiratory: Positive for shortness of breath. Negative for cough and chest  tightness.   Cardiovascular: Positive for chest pain. Negative for leg swelling.  Gastrointestinal: Negative for nausea, vomiting, abdominal pain, diarrhea and blood in stool.  Genitourinary: Negative for frequency, hematuria, flank pain and difficulty urinating.  Musculoskeletal: Negative for back pain and arthralgias.  Skin: Negative for rash.  Neurological: Negative for dizziness, speech difficulty, weakness, numbness and headaches.    Allergies  Aspirin  Home Medications   Current Outpatient Rx  Name  Route  Sig  Dispense  Refill  . benazepril (LOTENSIN) 10 MG tablet   Oral   Take 10 mg by mouth daily.         Marland Kitchen diltiazem (DILACOR XR) 240 MG 24 hr capsule   Oral   Take 240 mg by mouth daily.          Marland Kitchen doxazosin (CARDURA) 4 MG tablet   Oral   Take 4 mg by mouth daily.          . finasteride (PROSCAR) 5 MG tablet   Oral   Take 5 mg by mouth daily.           . Fluticasone-Salmeterol (ADVAIR) 250-50 MCG/DOSE AEPB   Inhalation   Inhale 1 puff into the lungs every 12 (twelve) hours.         . furosemide (LASIX) 40 MG tablet   Oral   Take 40 mg by mouth daily.         Marland Kitchen glyBURIDE (DIABETA) 5 MG tablet   Oral   Take 2.5-10 mg by mouth 2 (two) times daily with a meal. 2 tablets in the morning and 1/2 tablet in the evening         . insulin glargine (LANTUS) 100 UNIT/ML injection      10 Units every evening.          . loratadine (CLARITIN) 10 MG tablet   Oral   Take 10 mg by mouth daily.         . metoprolol (LOPRESSOR) 50 MG tablet   Oral   Take 50 mg by mouth 2 (two) times daily.         . potassium chloride SA (K-DUR,KLOR-CON) 20 MEQ tablet   Oral   Take 20 mEq by mouth daily.         . simvastatin (ZOCOR) 20 MG tablet   Oral   Take 20 mg by mouth every evening.         . warfarin (COUMADIN) 5 MG tablet   Oral   Take 2.5-5 mg by mouth See admin instructions. Takes 2.5mg  daily, except for wednesdays and saturdays patient takes  5mg   BP 136/45  Pulse 40  Temp(Src) 99 F (37.2 C) (Oral)  Resp 20  Ht 5\' 10"  (1.778 m)  Wt 188 lb (85.276 kg)  BMI 26.98 kg/m2  SpO2 97%  Physical Exam  Constitutional: He is oriented to person, place, and time. He appears well-developed and well-nourished.  HENT:  Head: Normocephalic and atraumatic.  Mouth/Throat: Oropharynx is clear and moist.  Eyes: Pupils are equal, round, and reactive to light.  Neck: Normal range of motion. Neck supple.  Cardiovascular: Normal rate, regular rhythm and normal heart sounds.   Pulmonary/Chest: Effort normal and breath sounds normal. No respiratory distress. He has no wheezes. He has no rales. He exhibits no tenderness.  Abdominal: Soft. Bowel sounds are normal. There is no tenderness. There is no rebound and no guarding.  Musculoskeletal: Normal range of motion. He exhibits edema (mild pitting edema bilaterally).  Lymphadenopathy:    He has no cervical adenopathy.  Neurological: He is alert and oriented to person, place, and time.  Skin: Skin is warm and dry. No rash noted.  Psychiatric: He has a normal mood and affect.    ED Course  Procedures (including critical care time)  Results for orders placed during the hospital encounter of 07/06/12  CBC      Result Value Range   WBC 7.0  4.0 - 10.5 K/uL   RBC 3.67 (*) 4.22 - 5.81 MIL/uL   Hemoglobin 11.3 (*) 13.0 - 17.0 g/dL   HCT 16.1 (*) 09.6 - 04.5 %   MCV 89.9  78.0 - 100.0 fL   MCH 30.8  26.0 - 34.0 pg   MCHC 34.2  30.0 - 36.0 g/dL   RDW 40.9 (*) 81.1 - 91.4 %   Platelets 108 (*) 150 - 400 K/uL  BASIC METABOLIC PANEL      Result Value Range   Sodium 142  135 - 145 mEq/L   Potassium 3.6  3.5 - 5.1 mEq/L   Chloride 106  96 - 112 mEq/L   CO2 25  19 - 32 mEq/L   Glucose, Bld 194 (*) 70 - 99 mg/dL   BUN 30 (*) 6 - 23 mg/dL   Creatinine, Ser 7.82 (*) 0.50 - 1.35 mg/dL   Calcium 9.0  8.4 - 95.6 mg/dL   GFR calc non Af Amer 41 (*) >90 mL/min   GFR calc Af Amer 48 (*)  >90 mL/min  PRO B NATRIURETIC PEPTIDE      Result Value Range   Pro B Natriuretic peptide (BNP) 1003.0 (*) 0 - 450 pg/mL  POCT I-STAT TROPONIN I      Result Value Range   Troponin i, poc 0.00  0.00 - 0.08 ng/mL   Comment 3            Dg Chest 1 View  07/06/2012  *RADIOLOGY REPORT*  Clinical Data: Chest pain.  CHEST - 1 VIEW  Comparison: 05/18/2012 CT.  07/27/2011 chest x-ray.  Findings: Post median sternotomy and clipping of the left atrium.  Pulmonary vascular congestion most notable centrally.  Pleural effusions greater on the right.  Slightly tortuous aorta.  Lucency right lung base may represent loculated pleural fluid surrounding lung.  Loculated pneumothorax felt to be less likely consideration.  IMPRESSION: Post median sternotomy and clipping of the left atrium.  Pulmonary vascular congestion most notable centrally.  Pleural effusions greater on the right.  Slightly tortuous aorta.  Lucency right lung base may represent loculated pleural fluid surrounding lung.  Loculated pneumothorax felt to be  less likely consideration.   Original Report Authenticated By: Lacy Duverney, M.D.      Date: 07/06/2012  Rate: 46  Rhythm: atrial fib with bradycardic rate  QRS Axis: left  Intervals: normal  ST/T Wave abnormalities: nonspecific ST/T changes  Conduction Disutrbances:none  Narrative Interpretation:   Old EKG Reviewed: changes noted, rate slowed     1. CHF (congestive heart failure)   2. Chest pain       MDM  Patient with multiple medical problems presents with bradycardia associated with congestive changes on chest x-ray and orthopnea associated with some intermittent chest pain. He does have some vascular congestion on EKG with an elevated BNP. His troponin is negative and there is no ischemic changes on EKG. He is bradycardic but is maintaining a normal blood pressure. I discussed this with [facility] on call who is coming to see the patient.  Patient was given a  dose of Lasix in the ED. He has not given aspirin due to an aspirin allergy        Rolan Bucco, MD 07/06/12 [phone]

## 2012-07-06 NOTE — ED Notes (Signed)
Cardiology at bedside.

## 2012-07-06 NOTE — ED Notes (Signed)
MD at bedside. 

## 2012-07-06 NOTE — H&P (Signed)
Ronald [name] is an 79 y.o. male.   Cardiologist: Dr. Allyson Sabal  Chief Complaint:  SOB HPI:   The patient is an 79 year old male with history of aortic stenosis and valve replacement in November 2012. This was completed with the magna-Ease bovine 23 mm bioprosthesis. At that time had normal coronary arteries and normal LV function. His history also includes right renal artery stenting as well as both iliac arteries by Dr. Kennieth Rad.  Dr. Allyson Sabal performed diamondback or rotational atherectomy and angiosculpt angioplasty of his right SFA April 2013. His history also includes COPD discontinued tobacco use as of January 2014, hypertension, dyslipidemia, type 2 diabetes mellitus as well as chronic atrial fibrillation. He does take Coumadin.  Patient was just seen by Dr. Allyson Sabal on April 30. He ordered a 2-D echocardiogram which showed normal wall motion ejection fraction of 55-60%. Left ventricular cavity size was normal with mild LVH. Aortic valve is well seated trivial central aortic insufficiency. Peak and mean gradients were 18 and 10 mm of mercury respectively. Was mild mitral valve regurgitation. Moderate tricuspid valve regurgitation. Peak PA pressure 44 mm of mercury.  The patient presents with  SOB which began several days ago and has gotten progressively worse.  +orthopnea, PND, LEE, polyuria.   He also reports his weight was up to 198 about two weeks ago but has improved to 185# at last check.  He does use salt in his diet and eats a lot of prepackaged foods.  He also reports CP but only 1.5/10 and lasts just a second.   Medications Prior to Admission medications   Medication Sig Start Date End Date Taking? Authorizing Provider  benazepril (LOTENSIN) 10 MG tablet Take 10 mg by mouth daily.   Yes Historical Provider, MD  diltiazem (DILACOR XR) 240 MG 24 hr capsule Take 240 mg by mouth daily.    Yes Historical Provider, MD  doxazosin (CARDURA) 4 MG tablet Take 4 mg by mouth daily.    Yes Wilmon Pali,  PA-C  finasteride (PROSCAR) 5 MG tablet Take 5 mg by mouth daily.     Yes Historical Provider, MD  Fluticasone-Salmeterol (ADVAIR) 250-50 MCG/DOSE AEPB Inhale 1 puff into the lungs every 12 (twelve) hours.   Yes Historical Provider, MD  furosemide (LASIX) 40 MG tablet Take 40 mg by mouth daily.   Yes Historical Provider, MD  glyBURIDE (DIABETA) 5 MG tablet Take 2.5-10 mg by mouth 2 (two) times daily with a meal. 2 tablets in the morning and 1/2 tablet in the evening   Yes Historical Provider, MD  insulin glargine (LANTUS) 100 UNIT/ML injection 10 Units every evening.  07/29/11  Yes [name], MD  loratadine (CLARITIN) 10 MG tablet Take 10 mg by mouth daily.   Yes Historical Provider, MD  metoprolol (LOPRESSOR) 50 MG tablet Take 50 mg by mouth 2 (two) times daily.   Yes Historical Provider, MD  potassium chloride SA (K-DUR,KLOR-CON) 20 MEQ tablet Take 20 mEq by mouth daily.   Yes Historical Provider, MD  simvastatin (ZOCOR) 20 MG tablet Take 20 mg by mouth every evening.   Yes Historical Provider, MD  warfarin (COUMADIN) 5 MG tablet Take 2.5-5 mg by mouth See admin instructions. Takes 2.5mg  daily, except for wednesdays and saturdays patient takes 5mg    Yes Historical Provider, MD   Allergies  Allergen Reactions  . Aspirin Anaphylaxis, Shortness Of Breath and Rash    "broke out in white welts; red blotches neck and face; windpipe closing; ~ 1962"  Past Medical History  Diagnosis Date  . AS (aortic stenosis)   . HTN (hypertension)   . Heart murmur   . Thrombocytopenia due to drugs     seen by [name] plts [id number] no rx  . Peripheral vascular disease very poor circulation legs and feet ... stents right and left legs... done in [name] 's office.   . Depression wife died 4 years ago.    Marland Kitchen COPD (chronic obstructive pulmonary disease)   . Shortness of breath   . Recurrent upper respiratory infection (URI)     sinusitis  . Claudication in peripheral vascular disease 06/17/2011  .  S/P angioplasty with stent, diamond back rotational athrectomy Prox. Rt. SFA 06/17/2011 06/17/2011  . Neuropathy due to secondary diabetes   . Type II diabetes mellitus   . Pneumonia 07/25/11    left  . Dysrhythmia 5-7 weeks ago ... sees dr Timothy Lasso    atrial fibrilation  . Bundle branch block, left   . Rapid atrial fibrillation 07/25/11  . Blood transfusion     w/hip operation  . History of stomach ulcers ~ 1951  . Bladder cancer     Bladder Cancer local  . Chronic kidney disease     renal artery stent    Past Surgical History  Procedure Laterality Date  . Peripheral arterial stent graft      2006 left anf right illiac stents Dr Orson Slick  . Aortic valve replacement  01/07/2011    Procedure: AORTIC VALVE REPLACEMENT (AVR);  Surgeon: Delight Ovens, MD;  Location: [facility] OR;  Service: Open Heart Surgery;  Laterality: N/A;  . Cardioversion  04/03/2011    Procedure: CARDIOVERSION;  Surgeon: Marykay Lex, MD;  Location: [facility] OR;  Service: Cardiovascular;  Laterality: N/A;  . Atherectomy  06/17/2011    balloon atherectomy  . Femur im nail  06/27/2011    Procedure: INTRAMEDULLARY (IM) NAIL FEMORAL;  Surgeon: Senaida Lange, MD;  Location: [facility] ORS;  Service: Orthopedics;  Laterality: Right;  . Tonsillectomy and adenoidectomy      "when I was a kid"  . Cholecystectomy    . Cardiac valve replacement  12/2010    AVR  . Cataract extraction w/ intraocular lens  implant, bilateral  ~ 2007  . Angioplasty / stenting femoral  05/2011    RLE  . Renal artery stent  2006    "I believe"    Family History  Problem Relation Age of Onset  . Heart disease Mother   . Cancer Brother     colon  . Stroke Father    Social History:  reports that he quit smoking about 3 months ago. His smoking use included Cigarettes. He has a 150 pack-year smoking history. He has never used smokeless tobacco. He reports that he drinks about 1.2 ounces of alcohol per week. He reports that he does not use illicit  drugs.  Allergies:  Allergies  Allergen Reactions  . Aspirin Anaphylaxis, Shortness Of Breath and Rash    "broke out in white welts; red blotches neck and face; windpipe closing; ~ 1962"     (Not in a hospital admission)  Results for orders placed during the hospital encounter of 07/06/12 (from the past 48 hour(s))  CBC     Status: Abnormal (Preliminary result)   Collection Time    07/06/12  4:11 PM      Result Value Range   WBC 7.0  4.0 - 10.5 K/uL   RBC 3.67 (*)  4.22 - 5.81 MIL/uL   Hemoglobin 11.3 (*) 13.0 - 17.0 g/dL   HCT 40.9 (*) 81.1 - 91.4 %   MCV 89.9  78.0 - 100.0 fL   MCH 30.8  26.0 - 34.0 pg   MCHC 34.2  30.0 - 36.0 g/dL   RDW 78.2 (*) 95.6 - 21.3 %   Platelets PENDING  150 - 400 K/uL  POCT I-STAT TROPONIN I     Status: None   Collection Time    07/06/12  4:24 PM      Result Value Range   Troponin i, poc 0.00  0.00 - 0.08 ng/mL   Comment 3            Comment: Due to the release kinetics of cTnI,     a negative result within the first hours     of the onset of symptoms does not rule out     myocardial infarction with certainty.     If myocardial infarction is still suspected,     repeat the test at appropriate intervals.   No results found.  Review of Systems  Constitutional: Negative for fever.  HENT: Negative for sore throat.   Respiratory: Positive for shortness of breath. Negative for cough and wheezing.   Cardiovascular: Positive for chest pain, orthopnea, leg swelling and PND.  Gastrointestinal: Negative for nausea, vomiting, abdominal pain, blood in stool and melena.  Genitourinary: Negative for hematuria.  Neurological: Negative for dizziness and weakness.    Blood pressure 148/45, pulse 46, temperature 99 F (37.2 C), temperature source Oral, resp. rate 18, height 5\' 10"  (1.778 m), weight 188 lb (85.276 kg), SpO2 96.00%. Physical Exam  Constitutional: He is oriented to person, place, and time. He appears well-developed and well-nourished. No  distress.  HENT:  Head: Normocephalic and atraumatic.  Eyes: EOM are normal. Pupils are equal, round, and reactive to light. No scleral icterus.  Neck: Normal range of motion. Neck supple. JVD present.  Cardiovascular: An irregularly irregular rhythm present. Bradycardia present.   Murmur heard.  Systolic murmur is present with a grade of 1/6  Pulses:      Radial pulses are 2+ on the right side, and 2+ on the left side.       Dorsalis pedis pulses are 2+ on the right side, and 2+ on the left side.  No carotid bruit.   Respiratory: Effort normal. He has rales (Mild).  GI: Soft. Bowel sounds are normal. There is no tenderness.  Musculoskeletal: He exhibits edema.  1+ LEE   Lymphadenopathy:    He has no cervical adenopathy.  Neurological: He is alert and oriented to person, place, and time. He exhibits normal muscle tone.  Skin: Skin is warm and dry.  Psychiatric: He has a normal mood and affect.     Assessment/Plan Principal Problem:   Acute diastolic heart failure Active Problems:   Chronic atrial fibrillation   COPD (chronic obstructive pulmonary disease)   DIABETES MELLITUS, TYPE II   HYPERLIPIDEMIA   HYPERTENSION   S/P aortic valve replacement: #23 Magna Ease Edwards Pericardial Valve  November 2012   Normal coronary arteries, cath 11/12   History of tobacco abuse- 75 years, quit in Feb 2014  Plan:  The patient will be admitted for IV diuresis.  40mg  was given in the ER.  I will continue BID tomorrow.  Add glycemic order set for DM mgt.  Daily weights and strict INs/outs.  He is bradycardic. Monitor HR with telemetry.  HAGER, BRYAN  07/06/2012, 4:58 PM  I have seen and examined the patient along with Wilburt Finlay, PA.  I have reviewed the chart, notes and new data.  I agree with PA/NP's note.  Key new complaints: clearly has exertional dyspnea, progressing to full blown orthopnea, now slightly better with diuretics Key examination changes: elevated JVP 7-8 cm, clear lungs,  mild ankle/pretibial edema (wearing compression stockings); bradycardia Key new findings / data: BNP elevation is relatively mild; recent echo findings reviewed.  PLAN: Stop diltiazem. Loop diuretics.  Long time spent educating about signs and symptoms of HF exacerbation, weight monitoring, Na restriction. Also discussed symptoms of uncontrolled DM. Will probably require about 48 h hospitalization.  Thurmon Fair, MD, [facility] [phone] 07/06/2012, 7:45 PM

## 2012-07-06 NOTE — Progress Notes (Signed)
ANTICOAGULATION CONSULT NOTE - Initial Consult  Pharmacy Consult for warfarin Indication: atrial fibrillation  Allergies  Allergen Reactions  . Aspirin Anaphylaxis, Shortness Of Breath and Rash    "broke out in white welts; red blotches neck and face; windpipe closing; ~ 1962"    Patient Measurements: Height: 5\' 10"  (177.8 cm) Weight: 188 lb (85.276 kg) IBW/kg (Calculated) : 73  Vital Signs: Temp: 99 F (37.2 C) (05/12 1621) Temp src: Oral (05/12 1621) BP: 136/45 mmHg (05/12 1800) Pulse Rate: 40 (05/12 1800)  Labs:  Recent Labs  07/06/12 1611 07/06/12 1822  HGB 11.3*  --   HCT 33.0*  --   PLT 108*  --   LABPROT  --  22.2*  INR  --  2.04*  CREATININE 1.46*  --     Estimated Creatinine Clearance: 36.1 ml/min (by C-G formula based on Cr of 1.46).   Medical History: Past Medical History  Diagnosis Date  . AS (aortic stenosis)   . HTN (hypertension)   . Heart murmur   . Thrombocytopenia due to drugs     seen by [name] plts 114000 no rx  . Peripheral vascular disease very poor circulation legs and feet ... stents right and left legs... done in [name] 's office.   . Depression wife died 4 years ago.    Marland Kitchen COPD (chronic obstructive pulmonary disease)   . Shortness of breath   . Recurrent upper respiratory infection (URI)     sinusitis  . Claudication in peripheral vascular disease 06/17/2011  . S/P angioplasty with stent, diamond back rotational athrectomy Prox. Rt. SFA 06/17/2011 06/17/2011  . Neuropathy due to secondary diabetes   . Type II diabetes mellitus   . Pneumonia 07/25/11    left  . Dysrhythmia 5-7 weeks ago ... sees dr Timothy Lasso    atrial fibrilation  . Bundle branch block, left   . Rapid atrial fibrillation 07/25/11  . Blood transfusion     w/hip operation  . History of stomach ulcers ~ 1951  . Bladder cancer     Bladder Cancer local  . Chronic kidney disease     renal artery stent    Medications:   (Not in a hospital  admission)  Assessment: 89 yom on chronic coumadin for history of afib presented to the ED with chest pain and SOB. INR today is therapeutic at 2.04. He is anemic at H/H 11.3/33 and thrombocytopenic at 108 but does have a known history of this. No bleeding noted.   Goal of Therapy:  INR 2-3   Plan:  1. Warfarin 2.5mg  PO x 1 tonight (normal home dose) 2. Daily INR  Karla Vines, Drake Leach 07/06/2012,8:06 PM

## 2012-07-07 DIAGNOSIS — I5031 Acute diastolic (congestive) heart failure: Secondary | ICD-10-CM

## 2012-07-07 DIAGNOSIS — I509 Heart failure, unspecified: Secondary | ICD-10-CM | POA: Diagnosis not present

## 2012-07-07 LAB — GLUCOSE, CAPILLARY
Glucose-Capillary: 106 mg/dL — ABNORMAL HIGH (ref 70–99)
Glucose-Capillary: 197 mg/dL — ABNORMAL HIGH (ref 70–99)

## 2012-07-07 LAB — BASIC METABOLIC PANEL
CO2: 30 mEq/L (ref 19–32)
Chloride: 106 mEq/L (ref 96–112)
Creatinine, Ser: 1.67 mg/dL — ABNORMAL HIGH (ref 0.50–1.35)
Potassium: 3.7 mEq/L (ref 3.5–5.1)

## 2012-07-07 LAB — PROTIME-INR
INR: 2.16 — ABNORMAL HIGH (ref 0.00–1.49)
Prothrombin Time: 23.2 seconds — ABNORMAL HIGH (ref 11.6–15.2)

## 2012-07-07 MED ORDER — WARFARIN SODIUM 2.5 MG PO TABS
2.5000 mg | ORAL_TABLET | Freq: Once | ORAL | Status: AC
  Administered 2012-07-07: 2.5 mg via ORAL
  Filled 2012-07-07: qty 1

## 2012-07-07 MED ORDER — INSULIN ASPART 100 UNIT/ML ~~LOC~~ SOLN
0.0000 [IU] | Freq: Three times a day (TID) | SUBCUTANEOUS | Status: DC
  Administered 2012-07-07: 3 [IU] via SUBCUTANEOUS
  Administered 2012-07-08: 5 [IU] via SUBCUTANEOUS

## 2012-07-07 NOTE — Progress Notes (Signed)
[facility]  DAILY PROGRESS NOTE   Subjective:  Considerably better, orthopnea has resolved. No further chest discomfort. Negative 5.6L since arrival, weight decreased 3 lb just over night shift.  Objective:  Temp:  [97.4 F (36.3 C)-99 F (37.2 C)] 97.8 F (36.6 C) (05/13 1040) Pulse Rate:  [40-57] 55 (05/13 1040) Resp:  [15-21] 20 (05/13 1040) BP: (136-173)/(43-96) 160/59 mmHg (05/13 1040) SpO2:  [92 %-99 %] 98 % (05/13 1108) Weight:  [85.276 kg (188 lb)-86.818 kg (191 lb 6.4 oz)] 85.458 kg (188 lb 6.4 oz) (05/13 0412) Weight change:   Intake/Output from previous day: 05/12 0701 - 05/13 0700 In: -  Out: 2850 [Urine:2850]  Intake/Output from this shift: Total I/O In: 483 [P.O.:480; I.V.:3] Out: 3275 [Urine:3275]  Medications: Current Facility-Administered Medications  Medication Dose Route Frequency Provider Last Rate Last Dose  . 0.9 %  sodium chloride infusion  250 mL Intravenous PRN Wilburt Finlay, PA-C      . acetaminophen (TYLENOL) tablet 650 mg  650 mg Oral Q4H PRN Wilburt Finlay, PA-C      . benazepril (LOTENSIN) tablet 10 mg  10 mg Oral Daily Wilburt Finlay, PA-C   10 mg at 07/07/12 1041  . doxazosin (CARDURA) tablet 4 mg  4 mg Oral Daily Wilburt Finlay, PA-C   4 mg at 07/07/12 1042  . finasteride (PROSCAR) tablet 5 mg  5 mg Oral QHS Wilburt Finlay, PA-C   5 mg at 07/06/12 2318  . furosemide (LASIX) injection 40 mg  40 mg Intravenous BID Wilburt Finlay, PA-C   40 mg at 07/07/12 0820  . glyBURIDE (DIABETA) tablet 5 mg  5 mg Oral Q breakfast Scottlynn Lindell, MD   5 mg at 07/07/12 1191   And  . glyBURIDE (DIABETA) tablet 2.5 mg  2.5 mg Oral Q supper Hendry Speas, MD      . insulin aspart (novoLOG) injection 0-15 Units  0-15 Units Subcutaneous TID WC Francie Keeling, MD      . insulin glargine (LANTUS) injection 10 Units  10 Units Subcutaneous QHS Wilburt Finlay, PA-C   10 Units at 07/06/12 2315  . loratadine (CLARITIN) tablet 10 mg  10 mg Oral Daily Wilburt Finlay, PA-C   10 mg at 07/07/12 1042  . metoprolol (LOPRESSOR) tablet 50 mg  50 mg Oral BID Wilburt Finlay, PA-C   50 mg at 07/07/12 1043  . mometasone-formoterol (DULERA) 100-5 MCG/ACT inhaler 2 puff  2 puff Inhalation BID Wilburt Finlay, PA-C   2 puff at 07/07/12 1102  . ondansetron (ZOFRAN) injection 4 mg  4 mg Intravenous Q6H PRN Wilburt Finlay, PA-C      . potassium chloride SA (K-DUR,KLOR-CON) CR tablet 20 mEq  20 mEq Oral Daily Wilburt Finlay, PA-C   20 mEq at 07/07/12 1043  . simvastatin (ZOCOR) tablet 20 mg  20 mg Oral QPM Wilburt Finlay, PA-C   20 mg at 07/06/12 2318  . sodium chloride 0.9 % injection 3 mL  3 mL Intravenous Q12H Wilburt Finlay, PA-C   3 mL at 07/07/12 1043  . sodium chloride 0.9 % injection 3 mL  3 mL Intravenous PRN Wilburt Finlay, PA-C      . warfarin (COUMADIN) tablet 2.5 mg  2.5 mg Oral ONCE-1800 [facility] [place]      . Warfarin - Pharmacist Dosing Inpatient   Does not apply q1800 Drake Leach Rumbarger, [facility]        Physical Exam: General appearance: alert, cooperative and no distress  Neck: JVD - 4 cm above sternal notch, no adenopathy, no carotid bruit, supple, symmetrical, trachea midline and thyroid not enlarged, symmetric, no tenderness/mass/nodules Lungs: clear to auscultation bilaterally Heart: irregularly irregular rhythm Extremities: extremities normal, atraumatic, no cyanosis or edema Neurologic: Grossly normal  Lab Results: Results for orders placed during the hospital encounter of 07/06/12 (from the past 48 hour(s))  CBC     Status: Abnormal   Collection Time    07/06/12  4:11 PM      Result Value Range   WBC 7.0  4.0 - 10.5 K/uL   RBC 3.67 (*) 4.22 - 5.81 MIL/uL   Hemoglobin 11.3 (*) 13.0 - 17.0 g/dL   HCT 40.9 (*) 81.1 - 91.4 %   MCV 89.9  78.0 - 100.0 fL   MCH 30.8  26.0 - 34.0 pg   MCHC 34.2  30.0 - 36.0 g/dL   RDW 78.2 (*) 95.6 - 21.3 %   Platelets 108 (*) 150 - 400 K/uL   Comment: PLATELET COUNT CONFIRMED BY SMEAR     REPEATED TO VERIFY      SPECIMEN CHECKED FOR CLOTS  BASIC METABOLIC PANEL     Status: Abnormal   Collection Time    07/06/12  4:11 PM      Result Value Range   Sodium 142  135 - 145 mEq/L   Potassium 3.6  3.5 - 5.1 mEq/L   Chloride 106  96 - 112 mEq/L   CO2 25  19 - 32 mEq/L   Glucose, Bld 194 (*) 70 - 99 mg/dL   BUN 30 (*) 6 - 23 mg/dL   Creatinine, Ser 0.86 (*) 0.50 - 1.35 mg/dL   Calcium 9.0  8.4 - 57.8 mg/dL   GFR calc non Af Amer 41 (*) >90 mL/min   GFR calc Af Amer 48 (*) >90 mL/min   Comment:            The eGFR has been calculated     using the CKD EPI equation.     This calculation has not been     validated in all clinical     situations.     eGFR's persistently     <90 mL/min signify     possible Chronic Kidney Disease.  PRO B NATRIURETIC PEPTIDE     Status: Abnormal   Collection Time    07/06/12  4:11 PM      Result Value Range   Pro B Natriuretic peptide (BNP) 1003.0 (*) 0 - 450 pg/mL  POCT I-STAT TROPONIN I     Status: None   Collection Time    07/06/12  4:24 PM      Result Value Range   Troponin i, poc 0.00  0.00 - 0.08 ng/mL   Comment 3            Comment: Due to the release kinetics of cTnI,     a negative result within the first hours     of the onset of symptoms does not rule out     myocardial infarction with certainty.     If myocardial infarction is still suspected,     repeat the test at appropriate intervals.  PROTIME-INR     Status: Abnormal   Collection Time    07/06/12  6:22 PM      Result Value Range   Prothrombin Time 22.2 (*) 11.6 - 15.2 seconds   INR 2.04 (*) 0.00 - 1.49  GLUCOSE, CAPILLARY  Status: Abnormal   Collection Time    07/06/12 10:27 PM      Result Value Range   Glucose-Capillary 157 (*) 70 - 99 mg/dL   Comment 1 Notify RN    PROTIME-INR     Status: Abnormal   Collection Time    07/07/12  4:35 AM      Result Value Range   Prothrombin Time 23.2 (*) 11.6 - 15.2 seconds   INR 2.16 (*) 0.00 - 1.49  GLUCOSE, CAPILLARY     Status: Abnormal    Collection Time    07/07/12  5:56 AM      Result Value Range   Glucose-Capillary 106 (*) 70 - 99 mg/dL    Imaging: Imaging results have been reviewed and Dg Chest 1 View  07/06/2012  *RADIOLOGY REPORT*  Clinical Data: Chest pain.  CHEST - 1 VIEW  Comparison: 05/18/2012 CT.  07/27/2011 chest x-ray.  Findings: Post median sternotomy and clipping of the left atrium.  Pulmonary vascular congestion most notable centrally.  Pleural effusions greater on the right.  Slightly tortuous aorta.  Lucency right lung base may represent loculated pleural fluid surrounding lung.  Loculated pneumothorax felt to be less likely consideration.  IMPRESSION: Post median sternotomy and clipping of the left atrium.  Pulmonary vascular congestion most notable centrally.  Pleural effusions greater on the right.  Slightly tortuous aorta.  Lucency right lung base may represent loculated pleural fluid surrounding lung.  Loculated pneumothorax felt to be less likely consideration.   Original Report Authenticated By: Lacy Duverney, M.D.     Assessment:  1. Principal Problem: 2.   Acute diastolic heart failure 3. Active Problems: 4.   DIABETES MELLITUS, TYPE II 5.   HYPERLIPIDEMIA 6.   HYPERTENSION 7.   S/P aortic valve replacement: #23 Magna Ease Edwards Pericardial Valve  November 2012 8.   Normal coronary arteries, cath 11/12 9.   COPD (chronic obstructive pulmonary disease) 10.   Chronic atrial fibrillation 11.   History of tobacco abuse- 75 years, quit in Feb 2014 12.   Bradycardia 13.  Plan:  1. Continue diuresis another 24 h. Judging by history he may still be 5-8 lb above dry weight.  2. Anticipate DC tomorrow. Home health CHF monitoring will be beneficial. 3. Spent a long time discussing weight monitoring, signs and symptoms of acute HF, sodium restriction, nutritional info, follow up, meds and diuretic dose adjustment  Time Spent Directly with Patient:  30 minutes  Length of Stay:  LOS: 1 day     Ayaan Ringle 07/07/2012, 3:49 PM

## 2012-07-07 NOTE — Care Management Note (Signed)
    Page 1 of 1   07/07/2012     3:34:08 PM   CARE MANAGEMENT NOTE 07/07/2012  Patient:  Ronald [name], Ronald [name]   Account Number:  [id number]  Date Initiated:  07/07/2012  Documentation initiated by:  Oletta Cohn  Subjective/Objective Assessment:   79 YO male; SOB     Action/Plan:   diurese/ home with homje   Anticipated DC Date:  07/08/2012   Anticipated DC Plan:  HOME W HOME HEALTH SERVICES      DC Planning Services  CM consult      [facility] Choice  HOME HEALTH   Choice offered to / List presented to:  C-1 Patient        HH arranged  HH-1 RN  HH-10 DISEASE MANAGEMENT      HH agency  [facility]   Status of service:  Completed, signed off Medicare Important Message given?   (If response is "NO", the following Medicare IM given date fields will be blank) Date Medicare IM given:   Date Additional Medicare IM given:    Discharge Disposition:    Per UR Regulation:  Reviewed for med. necessity/level of care/duration of stay  If discussed at Long Length of Stay Meetings, dates discussed:    Comments:  07/07/12@ 1515.Marland KitchenMarland KitchenOletta Cohn, RN, BSN, [employer] [phone] Spoke with pt regarding discharge planning.  Presented [facility] list of home health agencies.  Pt chose [facility] to render services.  Kizzie Furnish of [facility] aware.  No DME needs identified at this time.

## 2012-07-07 NOTE — Progress Notes (Signed)
Patient evaluated for community based chronic disease management services with [facility] Care Management Program as a benefit of patient's [employer].  Spoke with patient at bedside to explain [facility] Care Management services.  He requested that his daughter Esa Raden ([phone]) be contacted regarding services.  She manages his medications, physician follow up care, and household management.  Spoke with daughter who would like for him to participate with services for CHF management.  Patient has consented to services.  He needs additional teaching and monitoring for diet, weight management, and symptom recognition.  Left contact information and [facility] literature at bedside. Made inpatient Case Manager aware that [facility] Care Management consulted. Of note, [facility] Care Management services does not replace or interfere with any services that are arranged by inpatient case management or social work.  For additional questions or referrals please contact Anibal Henderson BSN RN [facility] Liaison at [phone].

## 2012-07-07 NOTE — Progress Notes (Signed)
ANTICOAGULATION CONSULT NOTE - Follow Up Consult  Pharmacy Consult for Coumadin Indication: atrial fibrillation  Allergies  Allergen Reactions  . Aspirin Anaphylaxis, Shortness Of Breath and Rash    "broke out in white welts; red blotches neck and face; windpipe closing; ~ 1962"    Patient Measurements: Height: 5\' 11"  (180.3 cm) Weight: 188 lb 6.4 oz (85.458 kg) (a scale) IBW/kg (Calculated) : 75.3  Vital Signs: Temp: 97.8 F (36.6 C) (05/13 1040) Temp src: Oral (05/13 1040) BP: 160/59 mmHg (05/13 1040) Pulse Rate: 55 (05/13 1040)  Labs:  Recent Labs  07/06/12 1611 07/06/12 1822 07/07/12 0435  HGB 11.3*  --   --   HCT 33.0*  --   --   PLT 108*  --   --   LABPROT  --  22.2* 23.2*  INR  --  2.04* 2.16*  CREATININE 1.46*  --   --     Estimated Creatinine Clearance: 37.2 ml/min (by C-G formula based on Cr of 1.46).  Medications:  Scheduled:  . benazepril  10 mg Oral Daily  . doxazosin  4 mg Oral Daily  . finasteride  5 mg Oral QHS  . [COMPLETED] furosemide  40 mg Intravenous Once  . furosemide  40 mg Intravenous BID  . glyBURIDE  5 mg Oral Q breakfast   And  . glyBURIDE  2.5 mg Oral Q supper  . insulin aspart  0-15 Units Subcutaneous TID WC  . insulin glargine  10 Units Subcutaneous QHS  . loratadine  10 mg Oral Daily  . metoprolol  50 mg Oral BID  . mometasone-formoterol  2 puff Inhalation BID  . potassium chloride SA  20 mEq Oral Daily  . simvastatin  20 mg Oral QPM  . sodium chloride  3 mL Intravenous Q12H  . [COMPLETED] warfarin  2.5 mg Oral Once  . Warfarin - Pharmacist Dosing Inpatient   Does not apply q1800  . [DISCONTINUED] glyBURIDE  2.5-10 mg Oral BID WC    Assessment: 79 y/o male admitted with SOB. Pharmacy consulted to manage Coumadin for Afib. INR is therapeutic at 2.16. No bleeding noted, H/H and platelets are low on admission - will watch.  Goal of Therapy:  INR 2-3 Monitor platelets by anticoagulation protocol: Yes   Plan:  -Coumadin  2.5 mg po tonight -INR daily -Monitor for signs/symptoms of bleeding  [facility], [address].D., BCPS Clinical Pharmacist Pager: [phone] 07/07/2012 11:05 AM

## 2012-07-08 ENCOUNTER — Other Ambulatory Visit: Payer: Self-pay | Admitting: Physician Assistant

## 2012-07-08 DIAGNOSIS — I509 Heart failure, unspecified: Secondary | ICD-10-CM | POA: Diagnosis not present

## 2012-07-08 LAB — BASIC METABOLIC PANEL
Calcium: 9.1 mg/dL (ref 8.4–10.5)
Chloride: 106 mEq/L (ref 96–112)
Creatinine, Ser: 1.4 mg/dL — ABNORMAL HIGH (ref 0.50–1.35)
GFR calc Af Amer: 50 mL/min — ABNORMAL LOW (ref >90)

## 2012-07-08 LAB — GLUCOSE, CAPILLARY
Glucose-Capillary: 77 mg/dL (ref 70–99)
Glucose-Capillary: 233 mg/dL — ABNORMAL HIGH (ref 70–99)

## 2012-07-08 LAB — PROTIME-INR: Prothrombin Time: 23.1 seconds — ABNORMAL HIGH (ref 11.6–15.2)

## 2012-07-08 MED ORDER — WARFARIN SODIUM 5 MG PO TABS
5.0000 mg | ORAL_TABLET | Freq: Once | ORAL | Status: DC
  Filled 2012-07-08: qty 1

## 2012-07-08 MED ORDER — FUROSEMIDE 40 MG PO TABS: 40.0000 mg | ORAL_TABLET | Freq: Two times a day (BID) | ORAL | Status: DC

## 2012-07-08 MED ORDER — FUROSEMIDE 40 MG PO TABS
40.0000 mg | ORAL_TABLET | Freq: Two times a day (BID) | ORAL | Status: DC
  Filled 2012-07-08 (×2): qty 1

## 2012-07-08 NOTE — Progress Notes (Signed)
[facility]  Subjective: Feeling a lot better.  Objective: Vital signs in last 24 hours: Temp:  [97.3 F (36.3 C)-98.5 F (36.9 C)] 98 F (36.7 C) (05/14 0941) Pulse Rate:  [57-67] 61 (05/14 0941) Resp:  [18-20] 20 (05/14 0941) BP: (135-170)/(50-67) 135/67 mmHg (05/14 0941) SpO2:  [93 %-96 %] 95 % (05/14 0941) Weight:  [182 lb 6.4 oz (82.736 kg)] 182 lb 6.4 oz (82.736 kg) (05/14 0537) Last BM Date: 07/07/12  Intake/Output from previous day: 05/13 0701 - 05/14 0700 In: 963 [P.O.:960; I.V.:3] Out: 5075 [Urine:5075] Intake/Output this shift: Total I/O In: 240 [P.O.:240] Out: 350 [Urine:350]  Medications Current Facility-Administered Medications  Medication Dose Route Frequency Provider Last Rate Last Dose  . 0.9 %  sodium chloride infusion  250 mL Intravenous PRN Wilburt Finlay, PA-C      . acetaminophen (TYLENOL) tablet 650 mg  650 mg Oral Q4H PRN Wilburt Finlay, PA-C      . benazepril (LOTENSIN) tablet 10 mg  10 mg Oral Daily Wilburt Finlay, PA-C   10 mg at 07/08/12 1191  . doxazosin (CARDURA) tablet 4 mg  4 mg Oral Daily Wilburt Finlay, PA-C   4 mg at 07/08/12 4782  . finasteride (PROSCAR) tablet 5 mg  5 mg Oral QHS Wilburt Finlay, PA-C   5 mg at 07/07/12 2134  . furosemide (LASIX) injection 40 mg  40 mg Intravenous BID Wilburt Finlay, PA-C   40 mg at 07/08/12 0829  . glyBURIDE (DIABETA) tablet 5 mg  5 mg Oral Q breakfast Mihai Croitoru, MD   5 mg at 07/08/12 9562   And  . glyBURIDE (DIABETA) tablet 2.5 mg  2.5 mg Oral Q supper Mihai Croitoru, MD   2.5 mg at 07/07/12 1620  . insulin aspart (novoLOG) injection 0-15 Units  0-15 Units Subcutaneous TID WC Thurmon Fair, MD   3 Units at 07/07/12 1618  . insulin glargine (LANTUS) injection 10 Units  10 Units Subcutaneous QHS Wilburt Finlay, PA-C   10 Units at 07/07/12 2134  . loratadine (CLARITIN) tablet 10 mg  10 mg Oral Daily Wilburt Finlay, PA-C   10 mg at 07/08/12 0943  . metoprolol (LOPRESSOR) tablet 50 mg  50 mg  Oral BID Wilburt Finlay, PA-C   50 mg at 07/08/12 0943  . mometasone-formoterol (DULERA) 100-5 MCG/ACT inhaler 2 puff  2 puff Inhalation BID Wilburt Finlay, PA-C   2 puff at 07/08/12 [id number]  . ondansetron (ZOFRAN) injection 4 mg  4 mg Intravenous Q6H PRN Wilburt Finlay, PA-C      . potassium chloride SA (K-DUR,KLOR-CON) CR tablet 20 mEq  20 mEq Oral Daily Wilburt Finlay, PA-C   20 mEq at 07/08/12 0943  . simvastatin (ZOCOR) tablet 20 mg  20 mg Oral QPM Wilburt Finlay, PA-C   20 mg at 07/07/12 1620  . sodium chloride 0.9 % injection 3 mL  3 mL Intravenous Q12H Wilburt Finlay, PA-C   3 mL at 07/08/12 0944  . sodium chloride 0.9 % injection 3 mL  3 mL Intravenous PRN Wilburt Finlay, PA-C      . warfarin (COUMADIN) tablet 5 mg  5 mg Oral ONCE-1800 Sever[facility] Gilbert, [facility]      . Warfarin - Pharmacist Dosing Inpatient   Does not apply q1800 Drake Leach Rumbarger, [facility]        PE: General appearance: alert, cooperative and no distress Lungs: clear to auscultation bilaterally Heart: irregularly irregular rhythm, 1/6 sys MM Extremities: Trace LEE Pulses: 2+  and symmetric Neurologic: Grossly normal    Lab Results:   Recent Labs  07/06/12 1611  WBC 7.0  HGB 11.3*  HCT 33.0*  PLT 108*   BMET  Recent Labs  07/06/12 1611 07/07/12 1746 07/08/12 0500  NA 142 145 144  K 3.6 3.7 3.8  CL 106 106 106  CO2 25 30 30   GLUCOSE 194* 117* 70  BUN 30* 27* 27*  CREATININE 1.46* 1.67* 1.40*  CALCIUM 9.0 9.2 9.1   PT/INR  Recent Labs  07/06/12 1822 07/07/12 0435 07/08/12 0500  LABPROT 22.2* 23.2* 23.1*  INR 2.04* 2.16* 2.15*     Assessment/Plan  Principal Problem:   Acute diastolic heart failure Active Problems:   Chronic atrial fibrillation   COPD (chronic obstructive pulmonary disease)   DIABETES MELLITUS, TYPE II   HYPERLIPIDEMIA   HYPERTENSION   S/P aortic valve replacement: #23 Magna Ease Edwards Pericardial Valve  November 2012   Normal coronary arteries, cath 11/12   History of tobacco abuse- 75  years, quit in Feb 2014   Bradycardia  Plan:  Net fluids:  -6.9L  Since adm.  Change to PO lasix 40mg  BID.   Afib with controlled rate.   INR therapeutic. Imprved SCr.   He is stable for DC home today.  TCM7 follow-up.   BP stable.    LOS: 2 days    Ronald [name], Ronald [name] 07/08/2012 11:18 AM  I have seen and evaluated the patient this date a.m. along with Wilburt Finlay, PA. I agree with his findings, examination as well as impression recommendations.  Admit for decompensated/acute on chronic diastolic heart failure with significant weight gain. His diabetes dramatically over 8 L this hospitalization. The plan is pretty yesterday by Dr. Royann Shivers, was to convert to oral medications and anticipate discharge tomorrow with TCM 7 followup. His A. fib rate is is controlled and is therapeutic on his INR. He feels mildly better.  I spent over 15 minutes providing CHF training as far as sliding scale Lasix.  Sliding scale Lasix: Weigh yourself when you get home, then Daily in the Morning. Your dry weight will be what your scale says on the day you return homd.(here is 180-181 lbs).  If he gains more than 3 pounds from dry weight: Increase her Lasix dosing to 80 mg in the morning 40 mg in the afternoon until weight returns to baseline dry weight. If weight gain is greater than 5 pounds in 2 days: Increased to Lasix 80 mg twice a day and contact the office for further assistance if weight does not go down the next day. If her weight goes down more than 3 pounds from dry weight: Hold the afternoon dose of Lasix until it returns to baseline dry weight  Providing stable in relating in the hall, he is stable for discharge today with TCM followup as scheduled.  Total M.D. and PA time the patient 35-40 minutes  Freya Zobrist W, M.D., M.S. [facility] [address] [place], [place]  [address]  [phone] Pager # [phone] 07/08/2012 12:24 PM

## 2012-07-08 NOTE — Progress Notes (Signed)
ANTICOAGULATION CONSULT NOTE - Follow Up Consult  Pharmacy Consult for Coumadin Indication: atrial fibrillation Labs:  Recent Labs  07/06/12 1611 07/06/12 1822 07/07/12 0435 07/07/12 1746 07/08/12 0500  HGB 11.3*  --   --   --   --   HCT 33.0*  --   --   --   --   PLT 108*  --   --   --   --   LABPROT  --  22.2* 23.2*  --  23.1*  INR  --  2.04* 2.16*  --  2.15*  CREATININE 1.46*  --   --  1.67* 1.40*    Estimated Creatinine Clearance: 38.8 ml/min (by C-G formula based on Cr of 1.4).  Assessment: 79 y/o male admitted with SOB. Pharmacy consulted to manage Coumadin for Afib. INR is therapeutic at 2.1. No bleeding noted, H/H and platelets are low on admission - will continue to watch.  Home dose he takes 2.5mg  daily, except for wednesdays and saturdays patient takes 5mg   Goal of Therapy:  INR 2-3 Monitor platelets by anticoagulation protocol: Yes   Plan:  -Coumadin 5 mg po tonight -INR daily -Monitor for signs/symptoms of bleeding  Sheppard Coil PharmD., BCPS Clinical Pharmacist Pager [phone] 07/08/2012 10:50 AM

## 2012-07-09 ENCOUNTER — Telehealth: Payer: Self-pay | Admitting: Cardiovascular Disease

## 2012-07-09 NOTE — Discharge Summary (Signed)
Physician Discharge Summary  [name] MRN: [id number] DOB/AGE: Feb 07, 1923 79 y.o.  Admit date: 07/06/2012 Discharge date: 07/09/2012  Admission Diagnoses:  Acute diastolic heart failure  Discharge Diagnoses:  Principal Problem:   Acute diastolic heart failure Active Problems:   Chronic atrial fibrillation   COPD (chronic obstructive pulmonary disease)   DIABETES MELLITUS, TYPE II   HYPERLIPIDEMIA   HYPERTENSION   S/P aortic valve replacement: #23 Magna Ease Edwards Pericardial Valve  November 2012   Normal coronary arteries, cath 11/12   History of tobacco abuse- 75 years, quit in Feb 2014   Bradycardia   Discharged Condition: stable  Hospital Course:   The patient is an 79 year old male with history of aortic stenosis and valve replacement in November 2012. This was completed with the magna-Ease bovine 23 mm bioprosthesis. At that time had normal coronary arteries and normal LV function. His history also includes right renal artery stenting as well as both iliac arteries by Dr. Kennieth Rad. Dr. Allyson Sabal performed diamondback or rotational atherectomy and angiosculpt angioplasty of his right SFA April 2013. His history also includes COPD discontinued tobacco use as of January 2014, hypertension, dyslipidemia, type 2 diabetes mellitus as well as chronic atrial fibrillation. He does take Coumadin.   Patient was just seen by Dr. Allyson Sabal on April 30. He ordered a 2-D echocardiogram which showed normal wall motion ejection fraction of 55-60%. Left ventricular cavity size was normal with mild LVH. Aortic valve is well seated trivial central aortic insufficiency. Peak and mean gradients were 18 and 10 mm of mercury respectively. Was mild mitral valve regurgitation. Moderate tricuspid valve regurgitation. Peak PA pressure 44 mm of mercury.   The patient presented with SOB which began several days ago and got progressively worse. +orthopnea, PND, LEE, polyuria. He also reported his  weight was up to 198 about two weeks ago but has improved to 185# at last check. He does use salt in his diet and eats a lot of prepackaged foods. He also reports CP but only 1.5/10 and lasts just a second.   The patient was admitted and started on IV diuretics. He had brisk diuresis with met fluid output of 7922.  Chest x-ray showed Pulmonary vascular congestion most notable centrally. Pleural effusions greater on the right.  Lasix was changed to 40 mg twice daily.  INR has been therapeutic and atrial fibrillation was well-controlled. Discharge weight is approximately 180 pounds.  Sliding scale Lasix dosing was discussed with the patient. The patient was seen by Dr. Herbie Baltimore who felt he was stable for DC home. TCM followup has been arranged.  Total Time Spent Completing Discharge was: 35-40 minutes  Consults: None  Significant Diagnostic Studies:  BNP: 1003  CBC    Component Value Date/Time   WBC 7.0 07/06/2012 1611   WBC 8.6 02/12/2010 1214   RBC 3.67* 07/06/2012 1611   RBC 4.08* 02/12/2010 1214   HGB 11.3* 07/06/2012 1611   HGB 13.4 02/12/2010 1214   HCT 33.0* 07/06/2012 1611   HCT 39.1 02/12/2010 1214   PLT 108* 07/06/2012 1611   PLT 114* 02/12/2010 1214   MCV 89.9 07/06/2012 1611   MCV 95.8 02/12/2010 1214   MCH 30.8 07/06/2012 1611   MCH 33.0 02/12/2010 1214   MCHC 34.2 07/06/2012 1611   MCHC 34.4 02/12/2010 1214   RDW 16.5* 07/06/2012 1611   RDW 14.2 02/12/2010 1214   LYMPHSABS 1.5 07/25/2011 0915   LYMPHSABS 1.7 02/12/2010 1214   MONOABS 2.1* 07/25/2011 0915  MONOABS 0.7 02/12/2010 1214   EOSABS 0.4 07/25/2011 0915   EOSABS 0.2 02/12/2010 1214   BASOSABS 0.0 07/25/2011 0915   BASOSABS 0.0 02/12/2010 1214    BMET    Component Value Date/Time   NA 144 07/08/2012 0500   K 3.8 07/08/2012 0500   CL 106 07/08/2012 0500   CO2 30 07/08/2012 0500   GLUCOSE 70 07/08/2012 0500   BUN 27* 07/08/2012 0500   CREATININE 1.40* 07/08/2012 0500   CALCIUM 9.1 07/08/2012 0500   GFRNONAA 43*  07/08/2012 0500   GFRAA 50* 07/08/2012 0500    CHEST - 1 VIEW  Comparison: 05/18/2012 CT. 07/27/2011 chest x-ray.  Findings: Post median sternotomy and clipping of the left atrium.  Pulmonary vascular congestion most notable centrally.  Pleural effusions greater on the right.  Slightly tortuous aorta.  Lucency right lung base may represent loculated pleural fluid surrounding lung. Loculated pneumothorax felt to be less likely consideration.  IMPRESSION: Post median sternotomy and clipping of the left atrium.  Pulmonary vascular congestion most notable centrally.  Pleural effusions greater on the right.    Treatments: IV Lasix  Discharge Exam: Blood pressure 135/67, pulse 61, temperature 98 F (36.7 C), temperature source Oral, resp. rate 20, height 5\' 11"  (1.803 m), weight 182 lb 6.4 oz (82.736 kg), SpO2 95.00%.   Disposition: 01-Home or Self Care  Discharge Orders   Future Appointments Provider Department Dept Phone   07/10/2012 9:00 AM Berneice Heinrich, PT [facility] [phone]   07/14/2012 9:00 AM Sallyanne Kuster, [place] [facility] [phone]   07/16/2012 9:00 AM Timoteo Gaul, PT [name] [phone]   07/17/2012 9:50 AM Phillips Hay, RPH-CPP [facility] [place] [phone]   11/11/2012 11:00 AM [facility] [address] [phone]   Patient to arrive 15 minutes prior to appointment time. No solid food 4 hours prior to exam. Liquids and Medicines are okay.   Future Orders Complete By Expires     Diet - low sodium heart healthy  As directed     Discharge instructions  As directed     Comments:      Sliding scale Lasix: Weigh yourself when you get home, then Daily in the Morning. Your dry weight will be what your scale says on the day you return home.(here is 182 lbs.).   If you gain more than 3 pounds from dry weight: Increase your Lasix dosing to 80 mg in the morning 40 mg in the afternoon until weight returns to baseline dry weight.  If weight gain is greater than 5 pounds in 2 days: Increased to Lasix 80 mg twice a day and contact the office for further assistance if weight does not go down the next day. If her weight goes down more than 3 pounds from dry weight: Hold the afternoon dose of Lasix until it returns to baseline dry weight    Increase activity slowly  As directed         Medication List    STOP taking these medications       diltiazem 240 MG 24 hr capsule  Commonly known as:  DILACOR XR      TAKE these medications       benazepril 10 MG tablet  Commonly known as:  LOTENSIN  Take 10 mg by mouth daily.     doxazosin 4 MG tablet  Commonly known as:  CARDURA  Take 4 mg by mouth  daily.     finasteride 5 MG tablet  Commonly known as:  PROSCAR  Take 5 mg by mouth daily.     Fluticasone-Salmeterol 250-50 MCG/DOSE Aepb  Commonly known as:  ADVAIR  Inhale 1 puff into the lungs every 12 (twelve) hours.     furosemide 40 MG tablet  Commonly known as:  LASIX  Take 1 tablet (40 mg total) by mouth 2 (two) times daily.     glyBURIDE 5 MG tablet  Commonly known as:  DIABETA  Take 2.5-10 mg by mouth 2 (two) times daily with a meal. 2 tablets in the morning and 1/2 tablet in the evening     insulin glargine 100 UNIT/ML injection  Commonly known as:  LANTUS  10 Units every evening.     loratadine 10 MG tablet  Commonly known as:  CLARITIN  Take 10 mg by mouth daily.     metoprolol 50 MG tablet  Commonly known as:  LOPRESSOR  Take 50 mg by mouth 2 (two) times daily.     potassium chloride SA 20 MEQ tablet  Commonly known as:  K-DUR,KLOR-CON  Take 20 mEq by mouth daily.     simvastatin 20 MG tablet  Commonly known as:  ZOCOR  Take 20 mg by mouth every evening.     warfarin 5 MG tablet  Commonly known as:  COUMADIN  Take  2.5-5 mg by mouth See admin instructions. Takes 2.5mg  daily, except for wednesdays and saturdays patient takes 5mg            Follow-up Information   Follow up with Willliam Pettet, PA-C. (Our office will call you with the appt date and time.)    Contact information:   [address] [place] [address] [phone]       Signed: Wilburt Finlay 07/09/2012, 8:44 AM

## 2012-07-09 NOTE — Discharge Summary (Signed)
I saw the patient on the day of discharge.  He had diuresed very well & was back to below his baseline "dry weight."  He was converted to oral Lasix at a dose higher than his pre-admission dose.  He was ready for discharge.  I agree with the discharge summary.  @[facility] @, M.D., M.S. [facility] [address] [place], [place]  [address]  [phone] Pager # [phone] 07/09/2012 11:03 PM

## 2012-07-09 NOTE — Telephone Encounter (Signed)
Left message about appointment date and time on 07/09/2012

## 2012-07-10 ENCOUNTER — Encounter: Payer: Self-pay | Admitting: Cardiovascular Disease

## 2012-07-10 ENCOUNTER — Encounter: Payer: Self-pay | Admitting: *Deleted

## 2012-07-10 ENCOUNTER — Ambulatory Visit (INDEPENDENT_AMBULATORY_CARE_PROVIDER_SITE_OTHER): Payer: Medicare Other | Admitting: Cardiology

## 2012-07-10 ENCOUNTER — Ambulatory Visit: Payer: Medicare Other | Admitting: Rehabilitative and Restorative Service Providers"

## 2012-07-10 DIAGNOSIS — E785 Hyperlipidemia, unspecified: Secondary | ICD-10-CM

## 2012-07-10 DIAGNOSIS — IMO0001 Reserved for inherently not codable concepts without codable children: Secondary | ICD-10-CM

## 2012-07-10 DIAGNOSIS — I5031 Acute diastolic (congestive) heart failure: Secondary | ICD-10-CM

## 2012-07-10 DIAGNOSIS — I10 Essential (primary) hypertension: Secondary | ICD-10-CM

## 2012-07-10 DIAGNOSIS — E119 Type 2 diabetes mellitus without complications: Secondary | ICD-10-CM | POA: Diagnosis not present

## 2012-07-10 DIAGNOSIS — Z0389 Encounter for observation for other suspected diseases and conditions ruled out: Secondary | ICD-10-CM | POA: Diagnosis not present

## 2012-07-10 DIAGNOSIS — Z87891 Personal history of nicotine dependence: Secondary | ICD-10-CM

## 2012-07-10 DIAGNOSIS — I4891 Unspecified atrial fibrillation: Secondary | ICD-10-CM | POA: Diagnosis not present

## 2012-07-10 DIAGNOSIS — Z888 Allergy status to other drugs, medicaments and biological substances status: Secondary | ICD-10-CM

## 2012-07-10 DIAGNOSIS — Z952 Presence of prosthetic heart valve: Secondary | ICD-10-CM

## 2012-07-10 DIAGNOSIS — I739 Peripheral vascular disease, unspecified: Secondary | ICD-10-CM

## 2012-07-10 DIAGNOSIS — Z954 Presence of other heart-valve replacement: Secondary | ICD-10-CM

## 2012-07-10 DIAGNOSIS — M6281 Muscle weakness (generalized): Secondary | ICD-10-CM | POA: Diagnosis not present

## 2012-07-10 DIAGNOSIS — T39095A Adverse effect of salicylates, initial encounter: Secondary | ICD-10-CM

## 2012-07-10 DIAGNOSIS — I4819 Other persistent atrial fibrillation: Secondary | ICD-10-CM

## 2012-07-10 DIAGNOSIS — J449 Chronic obstructive pulmonary disease, unspecified: Secondary | ICD-10-CM

## 2012-07-10 DIAGNOSIS — R269 Unspecified abnormalities of gait and mobility: Secondary | ICD-10-CM | POA: Diagnosis not present

## 2012-07-10 NOTE — Progress Notes (Signed)
07/10/2012 Ronald [name] Vonzell Schlatter   1923/01/01  [id number]  Primary Physicia [name], MD Primary Cardiologist: Dr Allyson Sabal  HPI:  The patient is an 79 year old male with history of aortic stenosis and valve replacement in November 2012. This was completed with the magna-Ease bovine 23 mm bioprosthesis. At that time had normal coronary arteries and normal LV function. His history also includes right renal artery stenting as well as both iliac arteries by Dr. Kennieth Rad. Dr. Allyson Sabal performed diamondback or rotational atherectomy and angiosculpt angioplasty of his right SFA April 2013. His history also includes COPD discontinued tobacco use as of January 2014, hypertension, dyslipidemia, type 2 diabetes mellitus as well as chronic atrial fibrillation. He does take Coumadin.  Patient was just seen by Dr. Allyson Sabal on April 30. He ordered a 2-D echocardiogram which showed normal wall motion ejection fraction of 55-60%. Left ventricular cavity size was normal with mild LVH. Aortic valve is well seated trivial central aortic insufficiency. Peak and mean gradients were 18 and 10 mm of mercury respectively. Was mild mitral valve regurgitation. Moderate tricuspid valve regurgitation. Peak PA pressure 44 mm of mercury.  The patient presented 07/06/12 with SOB which began several days prior and got progressively worse with orthopnea, PND, LEE, and polyuria. He also reported his weight was up to 198 about two weeks ago. He does use salt in his diet and eats a lot of prepackaged foods.  The patient was admitted and started on IV diuretics. He had brisk diuresis with met fluid output of 7922. Chest x-ray showed Pulmonary vascular congestion most notable centrally. Pleural effusions greater on the right. Lasix was changed to 40 mg twice daily. INR has been therapeutic and atrial fibrillation was well-controlled. Discharge weight is approximately 180 pounds. Sliding scale Lasix dosing was discussed with the patient. The patient was seen  by Dr. Herbie Baltimore who felt he was stable for DC home. TCM followup has been arranged.          He was supposed to be a TCM phone call today but was added to my schedule by mistake. He has done well since discharge. His medications were reviewed. He has no CHF on exam.    Current Outpatient Prescriptions  Medication Sig Dispense Refill  . alendronate (FOSAMAX) 70 MG tablet Take 70 mg by mouth every 7 (seven) days. Take with a full glass of water on an empty stomach.      . benazepril (LOTENSIN) 10 MG tablet Take 10 mg by mouth daily.      . clopidogrel (PLAVIX) 75 MG tablet Take 75 mg by mouth daily.      Marland Kitchen diltiazem (DILACOR XR) 240 MG 24 hr capsule Take 240 mg by mouth daily.      Marland Kitchen doxazosin (CARDURA) 4 MG tablet Take 4 mg by mouth daily.       . finasteride (PROSCAR) 5 MG tablet Take 5 mg by mouth daily.        . Fluticasone-Salmeterol (ADVAIR) 250-50 MCG/DOSE AEPB Inhale 1 puff into the lungs every 12 (twelve) hours.      . furosemide (LASIX) 40 MG tablet Take 1 tablet (40 mg total) by mouth 2 (two) times daily.  60 tablet  5  . glyBURIDE (DIABETA) 5 MG tablet Take 2.5-10 mg by mouth 2 (two) times daily with a meal. 2 tablets in the morning and 1/2 tablet in the evening      . insulin glargine (LANTUS) 100 UNIT/ML injection 10 Units every evening.       Marland Kitchen  loratadine (CLARITIN) 10 MG tablet Take 10 mg by mouth daily.      . metoprolol (LOPRESSOR) 50 MG tablet Take 50 mg by mouth 2 (two) times daily.      . potassium chloride SA (K-DUR,KLOR-CON) 20 MEQ tablet Take 20 mEq by mouth daily.      . simvastatin (ZOCOR) 20 MG tablet Take 20 mg by mouth every evening.      . traMADol (ULTRAM) 50 MG tablet Take 50 mg by mouth as needed for pain.      Marland Kitchen warfarin (COUMADIN) 5 MG tablet Take 2.5-5 mg by mouth See admin instructions. Takes 2.5mg  daily, except for wednesdays and saturdays patient takes 5mg        No current facility-administered medications for this visit.    Allergies  Allergen  Reactions  . Aspirin Anaphylaxis, Shortness Of Breath and Rash    "broke out in white welts; red blotches neck and face; windpipe closing; ~ 1962"    History   Social History  . Marital Status: Widowed    Spouse Name: N/A    Number of Children: 3  . Years of Education: N/A   Occupational History  . sales    Social History Main Topics  . Smoking status: Former Smoker -- 2.00 packs/day for 75 years    Types: Cigarettes    Quit date: 03/28/2012  . Smokeless tobacco: Never Used  . Alcohol Use: 1.2 oz/week    2 Cans of beer per week  . Drug Use: No  . Sexually Active: No   Other Topics Concern  . Not on file   Social History Narrative  . No narrative on file     Review of Systems: General: negative for chills, fever, night sweats or weight changes.  Cardiovascular: negative for chest pain, dyspnea on exertion, edema, orthopnea, palpitations, paroxysmal nocturnal dyspnea or shortness of breath Dermatological: negative for rash Respiratory: negative for cough or wheezing Urologic: negative for hematuria Abdominal: negative for nausea, vomiting, diarrhea, bright red blood per rectum, melena, or hematemesis Neurologic: negative for visual changes, syncope, or dizziness All other systems reviewed and are otherwise negative except as noted above.    There were no vitals taken for this visit.  General appearance: alert, cooperative, appears stated age and no distress Lungs: decreased breath sounds, no rales Heart: irregularly irregular rhythm    ASSESSMENT AND PLAN:   Acute diastolic heart failure- just discharged 07/06/12  Aspirin allergy, rash, SOB. Pt is on Plavix  Persistent atrial fibrillation  DIABETES MELLITUS, TYPE II  HYPERLIPIDEMIA  HYPERTENSION  PVD, Rt SFA PTA/HSRA 06/17/11  S/P aortic valve replacement: #23 Magna Ease Edwards Pericardial Valve  November 2012  COPD (chronic obstructive pulmonary disease)  History of tobacco abuse- 75 years, quit  in Feb 2014  Normal coronary arteries, cath 11/12  S/P aortic valve replacement: #23 Magna Ease Edwards Pericardial Valve  November 2012 Echo done 07/06/12- Nl LVF. Nl AOv gradient   PLAN  Follow up with Dr Allyson Sabal after carotid dopplers. BMP then.   Sharrieff Spratlin KPA-C 07/10/2012 1:34 PM

## 2012-07-10 NOTE — Assessment & Plan Note (Signed)
Echo done 07/06/12- Nl LVF. Nl AOv gradient

## 2012-07-14 ENCOUNTER — Ambulatory Visit: Payer: Medicare Other | Admitting: Physician Assistant

## 2012-07-14 ENCOUNTER — Ambulatory Visit: Payer: Medicare Other | Admitting: Physical Therapy

## 2012-07-14 DIAGNOSIS — R269 Unspecified abnormalities of gait and mobility: Secondary | ICD-10-CM | POA: Diagnosis not present

## 2012-07-14 DIAGNOSIS — M6281 Muscle weakness (generalized): Secondary | ICD-10-CM | POA: Diagnosis not present

## 2012-07-14 DIAGNOSIS — IMO0001 Reserved for inherently not codable concepts without codable children: Secondary | ICD-10-CM | POA: Diagnosis not present

## 2012-07-16 ENCOUNTER — Ambulatory Visit: Payer: Medicare Other | Admitting: Physical Therapy

## 2012-07-16 ENCOUNTER — Other Ambulatory Visit: Payer: Self-pay | Admitting: Cardiovascular Disease

## 2012-07-16 DIAGNOSIS — M6281 Muscle weakness (generalized): Secondary | ICD-10-CM | POA: Diagnosis not present

## 2012-07-16 DIAGNOSIS — IMO0001 Reserved for inherently not codable concepts without codable children: Secondary | ICD-10-CM | POA: Diagnosis not present

## 2012-07-16 DIAGNOSIS — R269 Unspecified abnormalities of gait and mobility: Secondary | ICD-10-CM | POA: Diagnosis not present

## 2012-07-16 NOTE — Telephone Encounter (Signed)
Coumadin refill

## 2012-07-17 ENCOUNTER — Ambulatory Visit (INDEPENDENT_AMBULATORY_CARE_PROVIDER_SITE_OTHER): Payer: Medicare Other | Admitting: Pharmacist Clinician (PhC)/ Clinical Pharmacy Specialist

## 2012-07-17 VITALS — BP 130/76 | HR 72

## 2012-07-17 DIAGNOSIS — I4819 Other persistent atrial fibrillation: Secondary | ICD-10-CM

## 2012-07-17 DIAGNOSIS — I4891 Unspecified atrial fibrillation: Secondary | ICD-10-CM | POA: Diagnosis not present

## 2012-07-17 DIAGNOSIS — Z7901 Long term (current) use of anticoagulants: Secondary | ICD-10-CM

## 2012-07-17 LAB — POCT INR: INR: 3.3

## 2012-07-23 ENCOUNTER — Ambulatory Visit (HOSPITAL_COMMUNITY)
Admission: RE | Admit: 2012-07-23 | Discharge: 2012-07-23 | Disposition: A | Discharge status: Home / Self Care | Payer: Medicare Other | Source: Ambulatory Visit | Attending: Cardiovascular Disease | Admitting: Cardiovascular Disease

## 2012-07-23 DIAGNOSIS — R269 Unspecified abnormalities of gait and mobility: Secondary | ICD-10-CM | POA: Diagnosis not present

## 2012-07-23 DIAGNOSIS — I6529 Occlusion and stenosis of unspecified carotid artery: Secondary | ICD-10-CM

## 2012-07-23 DIAGNOSIS — R0989 Other specified symptoms and signs involving the circulatory and respiratory systems: Secondary | ICD-10-CM

## 2012-07-23 DIAGNOSIS — M6281 Muscle weakness (generalized): Secondary | ICD-10-CM | POA: Diagnosis not present

## 2012-07-23 DIAGNOSIS — IMO0001 Reserved for inherently not codable concepts without codable children: Secondary | ICD-10-CM | POA: Diagnosis not present

## 2012-07-23 NOTE — Progress Notes (Signed)
Carotid Duplex Completed. Orla Estrin, RDMS, RVT  

## 2012-08-03 DIAGNOSIS — R609 Edema, unspecified: Secondary | ICD-10-CM | POA: Diagnosis not present

## 2012-08-03 DIAGNOSIS — I2510 Atherosclerotic heart disease of native coronary artery without angina pectoris: Secondary | ICD-10-CM | POA: Diagnosis not present

## 2012-08-03 DIAGNOSIS — I10 Essential (primary) hypertension: Secondary | ICD-10-CM | POA: Diagnosis not present

## 2012-08-03 DIAGNOSIS — I4891 Unspecified atrial fibrillation: Secondary | ICD-10-CM | POA: Diagnosis not present

## 2012-08-03 DIAGNOSIS — E1149 Type 2 diabetes mellitus with other diabetic neurological complication: Secondary | ICD-10-CM | POA: Diagnosis not present

## 2012-08-03 DIAGNOSIS — I6529 Occlusion and stenosis of unspecified carotid artery: Secondary | ICD-10-CM | POA: Diagnosis not present

## 2012-08-03 DIAGNOSIS — Z1331 Encounter for screening for depression: Secondary | ICD-10-CM | POA: Diagnosis not present

## 2012-08-03 DIAGNOSIS — I509 Heart failure, unspecified: Secondary | ICD-10-CM | POA: Diagnosis not present

## 2012-08-10 ENCOUNTER — Ambulatory Visit (INDEPENDENT_AMBULATORY_CARE_PROVIDER_SITE_OTHER): Payer: Medicare Other | Admitting: Pharmacist Clinician (PhC)/ Clinical Pharmacy Specialist

## 2012-08-10 VITALS — BP 130/60 | HR 64

## 2012-08-10 DIAGNOSIS — Z7901 Long term (current) use of anticoagulants: Secondary | ICD-10-CM

## 2012-08-10 DIAGNOSIS — I4891 Unspecified atrial fibrillation: Secondary | ICD-10-CM | POA: Diagnosis not present

## 2012-08-10 DIAGNOSIS — I4819 Other persistent atrial fibrillation: Secondary | ICD-10-CM

## 2012-08-10 LAB — POCT INR: INR: 2.6

## 2012-08-31 ENCOUNTER — Ambulatory Visit (INDEPENDENT_AMBULATORY_CARE_PROVIDER_SITE_OTHER): Payer: Medicare Other | Admitting: Pharmacist Clinician (PhC)/ Clinical Pharmacy Specialist

## 2012-08-31 VITALS — BP 142/70 | HR 76

## 2012-08-31 DIAGNOSIS — Z7901 Long term (current) use of anticoagulants: Secondary | ICD-10-CM

## 2012-08-31 DIAGNOSIS — I4819 Other persistent atrial fibrillation: Secondary | ICD-10-CM

## 2012-08-31 DIAGNOSIS — I4891 Unspecified atrial fibrillation: Secondary | ICD-10-CM

## 2012-09-07 IMAGING — CR DG CHEST 2V
1 series · 1 of 1 positions shown · non-contrast
Comparison: 06/29/2011

CLINICAL DATA: Shortness of breath, cough.

CHEST - 2 VIEW

[view not recorded]
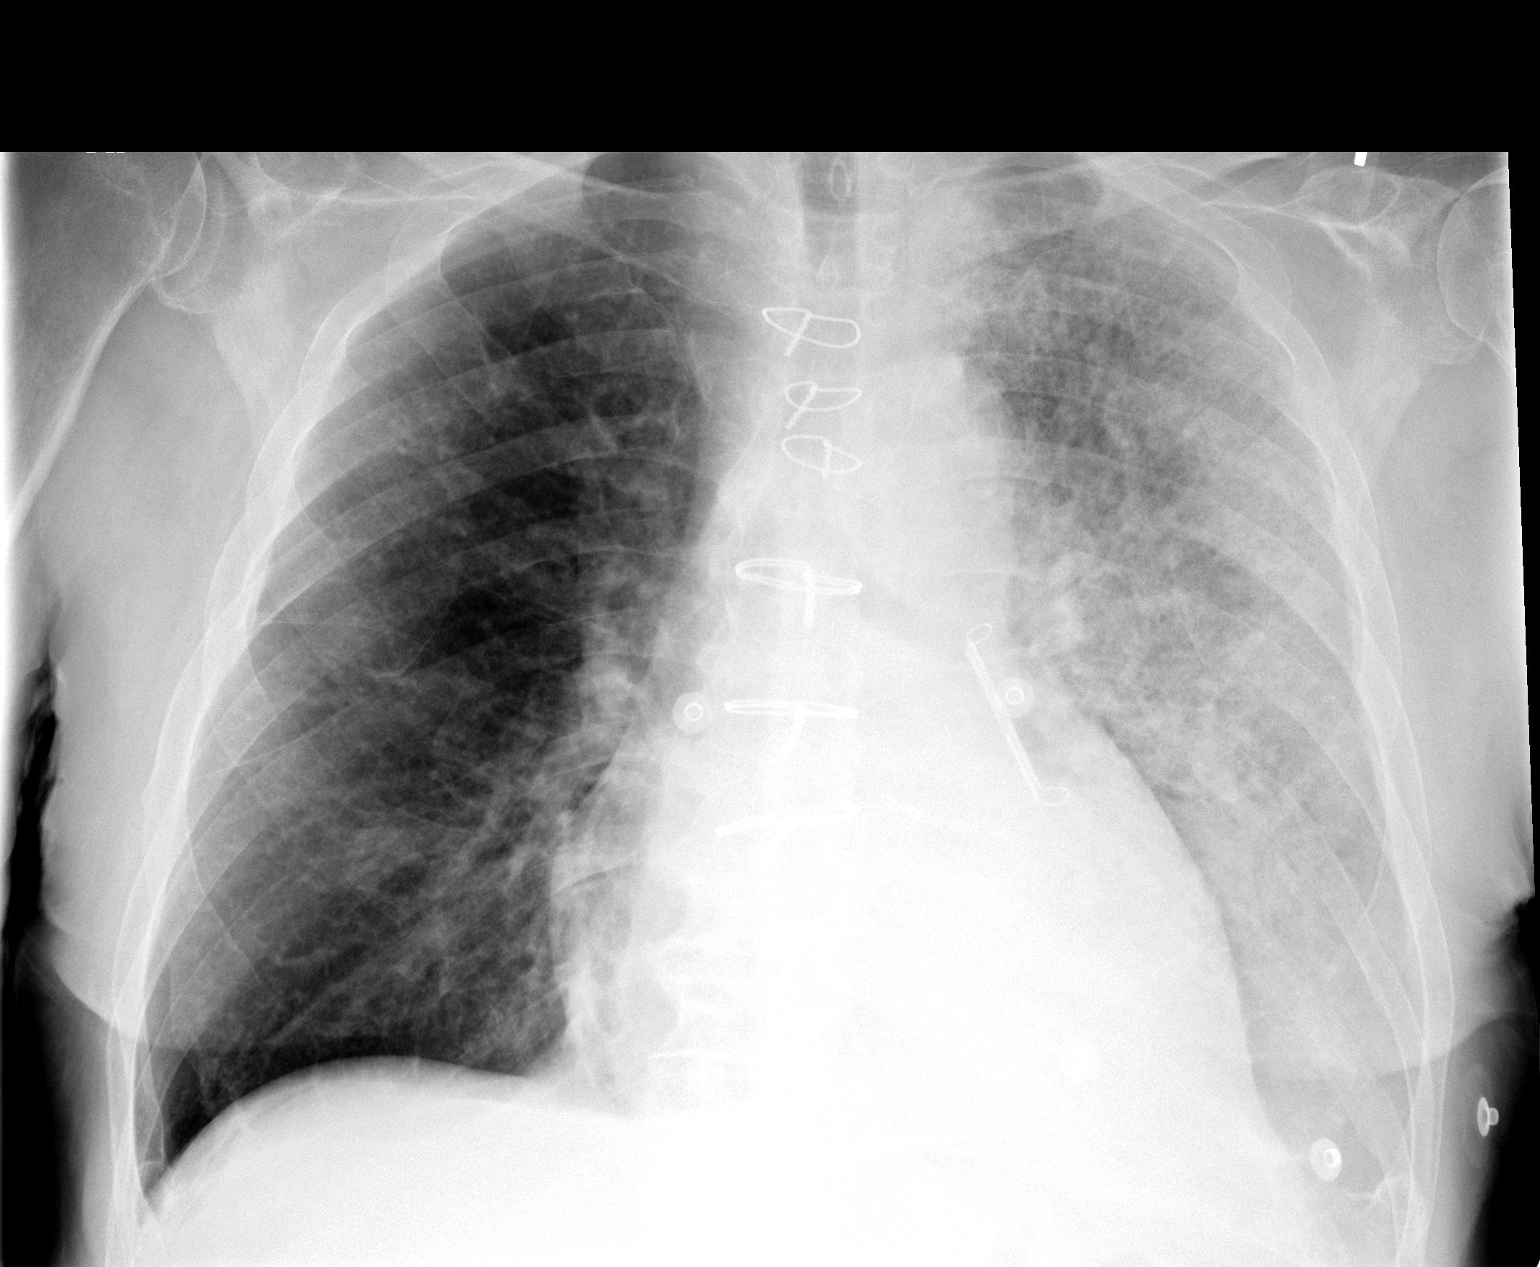

[1 of 1 positions shown; findings below may reference images not displayed]

FINDINGS: Prior median sternotomy and valve replacement.  Diffuse
airspace disease throughout the left lung concerning for
infection/pneumonia.  Right lung is clear.  Heart is borderline in
size.  Small left pleural effusion.

Degenerative changes in the thoracic spine.
IMPRESSION: Diffuse left lung airspace disease concerning for pneumonia.  Small
left effusion.

## 2012-09-09 IMAGING — CR DG CHEST 2V
1 series · 1 of 1 positions shown · non-contrast
Comparison: Plain film chest 07/25/2011.

CLINICAL DATA: Pneumonia.  Shortness of breath.

CHEST - 2 VIEW

[view not recorded]
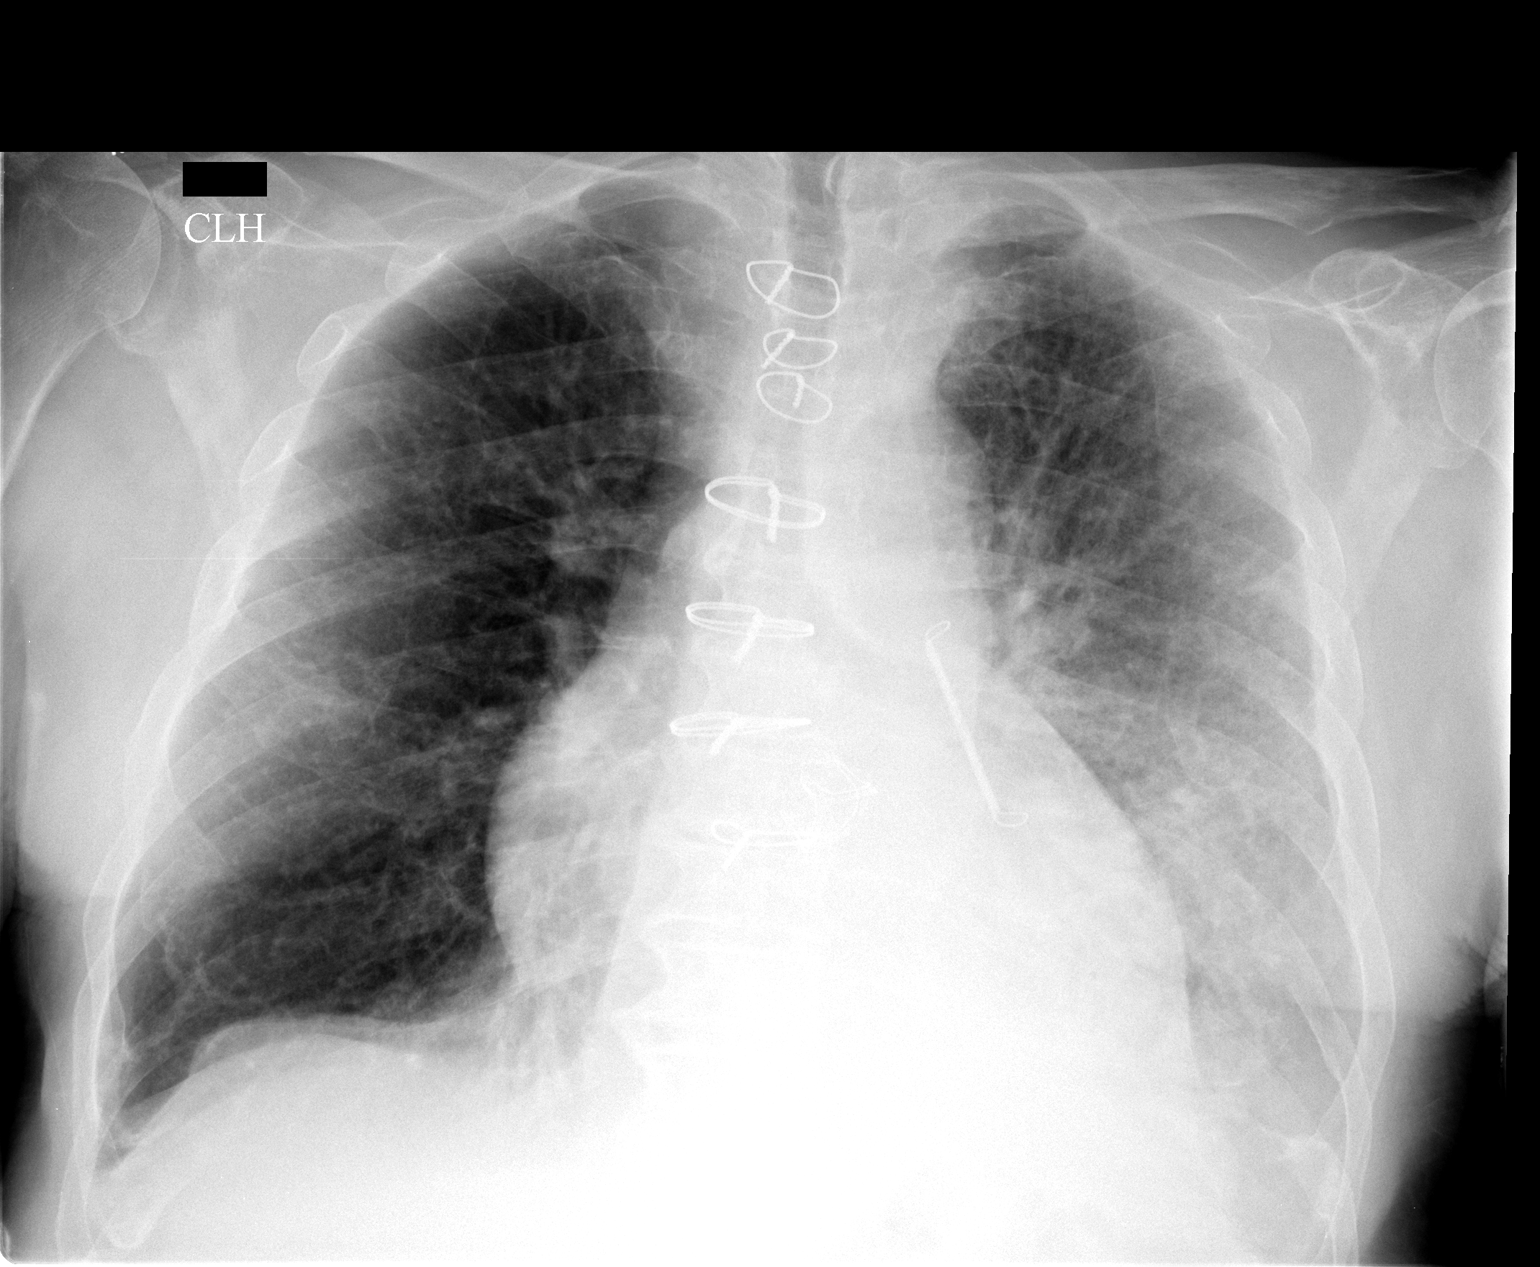

[1 of 1 positions shown; findings below may reference images not displayed]

FINDINGS: Airspace disease throughout the left chest persists but
shows some improvement.  Right lung is clear.  There is no
pneumothorax.  Cardiomegaly is noted.  Left pleural effusion is
identified.  The patient is status post aortic valve replacement
with an atrial appendage closure device again seen.
IMPRESSION: Some improvement in diffuse left side airspace disease, left
pleural effusion noted.

## 2012-09-14 ENCOUNTER — Ambulatory Visit (INDEPENDENT_AMBULATORY_CARE_PROVIDER_SITE_OTHER): Payer: Medicare Other | Admitting: Pharmacist Clinician (PhC)/ Clinical Pharmacy Specialist

## 2012-09-14 VITALS — BP 132/70 | HR 76

## 2012-09-14 DIAGNOSIS — I4819 Other persistent atrial fibrillation: Secondary | ICD-10-CM

## 2012-09-14 DIAGNOSIS — Z7901 Long term (current) use of anticoagulants: Secondary | ICD-10-CM | POA: Diagnosis not present

## 2012-09-14 DIAGNOSIS — I4891 Unspecified atrial fibrillation: Secondary | ICD-10-CM | POA: Diagnosis not present

## 2012-09-14 LAB — POCT INR: INR: 3.5

## 2012-09-23 ENCOUNTER — Telehealth: Payer: Self-pay | Admitting: Emergency Medicine

## 2012-09-23 NOTE — Telephone Encounter (Signed)
Called patient x3 and lm for return appointment. No return call back. Sent letter 09/23/12.

## 2012-10-05 ENCOUNTER — Ambulatory Visit: Payer: Medicare Other | Admitting: Pharmacist Clinician (PhC)/ Clinical Pharmacy Specialist

## 2012-10-07 ENCOUNTER — Ambulatory Visit (INDEPENDENT_AMBULATORY_CARE_PROVIDER_SITE_OTHER): Payer: Medicare Other | Admitting: Pharmacist Clinician (PhC)/ Clinical Pharmacy Specialist

## 2012-10-07 VITALS — BP 128/66 | HR 84

## 2012-10-07 DIAGNOSIS — Z7901 Long term (current) use of anticoagulants: Secondary | ICD-10-CM

## 2012-10-07 DIAGNOSIS — I4819 Other persistent atrial fibrillation: Secondary | ICD-10-CM

## 2012-10-07 DIAGNOSIS — I4891 Unspecified atrial fibrillation: Secondary | ICD-10-CM | POA: Diagnosis not present

## 2012-10-15 ENCOUNTER — Telehealth (HOSPITAL_COMMUNITY): Payer: Self-pay | Admitting: Cardiovascular Disease

## 2012-10-30 ENCOUNTER — Other Ambulatory Visit (HOSPITAL_COMMUNITY): Payer: Self-pay | Admitting: Cardiovascular Disease

## 2012-10-30 ENCOUNTER — Encounter (HOSPITAL_COMMUNITY): Payer: Self-pay | Admitting: *Deleted

## 2012-10-30 DIAGNOSIS — I739 Peripheral vascular disease, unspecified: Secondary | ICD-10-CM

## 2012-11-04 ENCOUNTER — Ambulatory Visit (INDEPENDENT_AMBULATORY_CARE_PROVIDER_SITE_OTHER): Payer: Medicare Other | Admitting: Pharmacist Clinician (PhC)/ Clinical Pharmacy Specialist

## 2012-11-04 VITALS — BP 120/56 | HR 68

## 2012-11-04 DIAGNOSIS — I4819 Other persistent atrial fibrillation: Secondary | ICD-10-CM

## 2012-11-04 DIAGNOSIS — I4891 Unspecified atrial fibrillation: Secondary | ICD-10-CM

## 2012-11-04 DIAGNOSIS — Z7901 Long term (current) use of anticoagulants: Secondary | ICD-10-CM

## 2012-11-06 DIAGNOSIS — E1159 Type 2 diabetes mellitus with other circulatory complications: Secondary | ICD-10-CM | POA: Diagnosis not present

## 2012-11-06 DIAGNOSIS — I4891 Unspecified atrial fibrillation: Secondary | ICD-10-CM | POA: Diagnosis not present

## 2012-11-06 DIAGNOSIS — Z954 Presence of other heart-valve replacement: Secondary | ICD-10-CM | POA: Diagnosis not present

## 2012-11-06 DIAGNOSIS — I509 Heart failure, unspecified: Secondary | ICD-10-CM | POA: Diagnosis not present

## 2012-11-06 DIAGNOSIS — I2510 Atherosclerotic heart disease of native coronary artery without angina pectoris: Secondary | ICD-10-CM | POA: Diagnosis not present

## 2012-11-06 DIAGNOSIS — Z23 Encounter for immunization: Secondary | ICD-10-CM | POA: Diagnosis not present

## 2012-11-06 DIAGNOSIS — I739 Peripheral vascular disease, unspecified: Secondary | ICD-10-CM | POA: Diagnosis not present

## 2012-11-06 DIAGNOSIS — J984 Other disorders of lung: Secondary | ICD-10-CM | POA: Diagnosis not present

## 2012-11-06 DIAGNOSIS — E1149 Type 2 diabetes mellitus with other diabetic neurological complication: Secondary | ICD-10-CM | POA: Diagnosis not present

## 2012-11-11 ENCOUNTER — Ambulatory Visit (INDEPENDENT_AMBULATORY_CARE_PROVIDER_SITE_OTHER)
Admission: RE | Admit: 2012-11-11 | Discharge: 2012-11-11 | Disposition: A | Discharge status: Home / Self Care | Payer: Medicare Other | Source: Ambulatory Visit | Attending: Emergency Medicine | Admitting: Emergency Medicine

## 2012-11-11 DIAGNOSIS — R918 Other nonspecific abnormal finding of lung field: Secondary | ICD-10-CM | POA: Diagnosis not present

## 2012-11-11 DIAGNOSIS — J438 Other emphysema: Secondary | ICD-10-CM | POA: Diagnosis not present

## 2012-11-12 ENCOUNTER — Encounter: Payer: Self-pay | Admitting: Emergency Medicine

## 2012-11-12 ENCOUNTER — Ambulatory Visit (INDEPENDENT_AMBULATORY_CARE_PROVIDER_SITE_OTHER): Payer: Medicare Other | Admitting: Emergency Medicine

## 2012-11-12 VITALS — BP 122/60 | HR 62 | Ht 71.75 in | Wt 188.6 lb

## 2012-11-12 DIAGNOSIS — R918 Other nonspecific abnormal finding of lung field: Secondary | ICD-10-CM | POA: Diagnosis not present

## 2012-11-12 DIAGNOSIS — J449 Chronic obstructive pulmonary disease, unspecified: Secondary | ICD-10-CM | POA: Diagnosis not present

## 2012-11-12 DIAGNOSIS — J4489 Other specified chronic obstructive pulmonary disease: Secondary | ICD-10-CM

## 2012-11-12 NOTE — Assessment & Plan Note (Signed)
Your CT scan was stable. You need a repeat scan in 6 months.

## 2012-11-12 NOTE — Assessment & Plan Note (Signed)
We will try stopping Advair and starting Anoro 1 inhalation daily for a month. If your breathing benefits then call our office so we can send a prescription for the Anoro. If you do not benefit then go back to your Advair Follow with Dr Delton Coombes in 6 months or sooner if you have any problems

## 2012-11-12 NOTE — Progress Notes (Signed)
Subjective:    [name], male    DOB: Jan 10, 1923, 79 y.o.   MRN: [id number]  HPI 79 yo former smoker (> 100 pk-yrs, quit 2/'14) with COPD on Advair, AS s/p bio-AVR, HTN, PVD, DM2, A Fib, CKD, bladder CA. He was admitted Summer '13 for R hip fx. He underwent a CT-PA on 05/18/12 for mid-CP in the am on awakening x 3 days, felt like indigestion. He is referred by Dr Timothy Lasso to evaluate a 8x3 mm RUL nodule and some peripheral LUL 8mm GG nodule. His indigestion has resolved. Takes Advair qd, breathing is limited with heavy exertion but for the most part he in not SOB.   ROV 11/12/12 -- hx COPD, AVR, HTN, DM. Under eval for RUL nodule and LUL GG nodule. Repeat CT scan 9/17 >> both lesions are stable in size compared with 05/18/12. He is back to smoking 1 pk/day.  He is taking Advair qd, not sure it helps him.    Review of Systems  Constitutional: Positive for appetite change. Negative for fever and unexpected weight change.  HENT: Positive for trouble swallowing. Negative for ear pain, nosebleeds, congestion, sore throat, rhinorrhea, sneezing, dental problem, postnasal drip and sinus pressure.   Eyes: Negative for redness and itching.  Respiratory: Positive for shortness of breath. Negative for cough, chest tightness and wheezing.   Cardiovascular: Positive for chest pain. Negative for palpitations and leg swelling.  Gastrointestinal: Negative for nausea and vomiting.  Genitourinary: Negative for dysuria.  Musculoskeletal: Negative for joint swelling.  Skin: Negative for rash.  Neurological: Negative for headaches.  Hematological: Does not bruise/bleed easily.  Psychiatric/Behavioral: Negative for dysphoric mood. The patient is not nervous/anxious.        Objective:   Physical Exam Filed Vitals:   11/12/12 1626  BP: 122/60  Pulse: 62  Height: 5' 11.75" (1.822 m)  Weight: 188 lb 9.6 oz (85.548 kg)  SpO2: 97%   Gen: Pleasant, well-nourished, in no distress,  normal  affect  ENT: No lesions,  mouth clear,  oropharynx clear, no postnasal drip  Neck: No JVD, no TMG, no carotid bruits  Lungs: No use of accessory muscles, no dullness to percussion, clear without rales or rhonchi  Cardiovascular: RRR, heart sounds normal, no murmur or gallops, no peripheral edema  Abdomen: soft and NT, no HSM,  BS normal  Musculoskeletal: No deformities, no cyanosis or clubbing  Neuro: alert, non focal  Skin: Warm, no lesions or rashes   11/11/12 --  COMPARISON: Chest CT 05/18/2012, abdominal CT 06/29/2011 and  thoracic spine CT 01/15/2006.  FINDINGS:  Small mediastinal and hilar lymph nodes are stable. No  pathologically enlarged lymph nodes are identified. There is stable  diffuse atherosclerosis of the aorta, great vessels and coronary  arteries status post median sternotomy and aortic valve replacement.  No pleural or pericardial effusion is demonstrated. Mild thyroid  nodularity appears unchanged.  Moderate diffuse changes of centrilobular emphysema are again noted.  The small irregular right upper lobe nodular density on image number  18 is unchanged. There are stable subpleural ground-glass opacities  in the left upper lobe (image 13) and in the right upper lobe (image  26). No new or enlarging pulmonary nodules are identified. There is  no confluent airspace opacity.  The visualized upper abdomen has a stable appearance with  low-density lesions within the visualized pancreatic body and tail.  Chronic T12 compression deformity appears unchanged.  IMPRESSION:  1. Stable appearance of the lungs with small  nodules/focal  ground-glass opacities since prior study obtained 6 months ago. A  benign etiology is favored. Follow CT in 6-12 months is suggested to  better establish stability.  2. No acute findings or lymphadenopathy.  3. Stable atherosclerosis status post median sternotomy.      Assessment & Plan:  COPD (chronic obstructive pulmonary  disease) We will try stopping Advair and starting Anoro 1 inhalation daily for a month. If your breathing benefits then call our office so we can send a prescription for the Anoro. If you do not benefit then go back to your Advair Follow with Dr Delton Coombes in 6 months or sooner if you have any problems

## 2012-11-12 NOTE — Patient Instructions (Addendum)
We will try stopping Advair and starting Anoro 1 inhalation daily for a month. If your breathing benefits then call our office so we can send a prescription for the Anoro. If you do not benefit then go back to your Advair Your CT scan was stable. You need a repeat scan in 6 months.  Follow with Dr Delton Coombes in 6 months or sooner if you have any problems

## 2012-11-26 ENCOUNTER — Ambulatory Visit (HOSPITAL_COMMUNITY)
Admission: RE | Admit: 2012-11-26 | Discharge: 2012-11-26 | Disposition: A | Discharge status: Home / Self Care | Payer: Medicare Other | Source: Ambulatory Visit | Attending: Cardiovascular Disease | Admitting: Cardiovascular Disease

## 2012-11-26 ENCOUNTER — Ambulatory Visit (INDEPENDENT_AMBULATORY_CARE_PROVIDER_SITE_OTHER): Payer: Medicare Other | Admitting: Pharmacist Clinician (PhC)/ Clinical Pharmacy Specialist

## 2012-11-26 VITALS — BP 120/62 | HR 68

## 2012-11-26 DIAGNOSIS — I70219 Atherosclerosis of native arteries of extremities with intermittent claudication, unspecified extremity: Secondary | ICD-10-CM

## 2012-11-26 DIAGNOSIS — Z7901 Long term (current) use of anticoagulants: Secondary | ICD-10-CM

## 2012-11-26 DIAGNOSIS — I739 Peripheral vascular disease, unspecified: Secondary | ICD-10-CM | POA: Diagnosis not present

## 2012-11-26 DIAGNOSIS — I4891 Unspecified atrial fibrillation: Secondary | ICD-10-CM

## 2012-11-26 DIAGNOSIS — I4819 Other persistent atrial fibrillation: Secondary | ICD-10-CM

## 2012-11-26 NOTE — Progress Notes (Signed)
Arterial Duplex Lower Ext. Completed. Ricky Gallery, BS, RDMS, RVT  

## 2012-11-30 ENCOUNTER — Ambulatory Visit: Payer: Medicare Other | Admitting: Cardiovascular Disease

## 2012-12-01 ENCOUNTER — Ambulatory Visit: Payer: Medicare Other | Admitting: Cardiology

## 2012-12-01 DIAGNOSIS — I739 Peripheral vascular disease, unspecified: Secondary | ICD-10-CM | POA: Diagnosis not present

## 2012-12-01 DIAGNOSIS — R609 Edema, unspecified: Secondary | ICD-10-CM | POA: Diagnosis not present

## 2012-12-01 DIAGNOSIS — I10 Essential (primary) hypertension: Secondary | ICD-10-CM | POA: Diagnosis not present

## 2012-12-01 DIAGNOSIS — T148XXA Other injury of unspecified body region, initial encounter: Secondary | ICD-10-CM | POA: Diagnosis not present

## 2012-12-01 DIAGNOSIS — I4891 Unspecified atrial fibrillation: Secondary | ICD-10-CM | POA: Diagnosis not present

## 2012-12-02 ENCOUNTER — Encounter: Payer: Self-pay | Admitting: Cardiovascular Disease

## 2012-12-02 ENCOUNTER — Ambulatory Visit (INDEPENDENT_AMBULATORY_CARE_PROVIDER_SITE_OTHER): Payer: Medicare Other | Admitting: Cardiovascular Disease

## 2012-12-02 VITALS — BP 130/60 | HR 73 | Ht 72.0 in | Wt 189.5 lb

## 2012-12-02 DIAGNOSIS — R0989 Other specified symptoms and signs involving the circulatory and respiratory systems: Secondary | ICD-10-CM

## 2012-12-02 DIAGNOSIS — I4891 Unspecified atrial fibrillation: Secondary | ICD-10-CM

## 2012-12-02 DIAGNOSIS — I10 Essential (primary) hypertension: Secondary | ICD-10-CM | POA: Diagnosis not present

## 2012-12-02 DIAGNOSIS — L0291 Cutaneous abscess, unspecified: Secondary | ICD-10-CM | POA: Diagnosis not present

## 2012-12-02 DIAGNOSIS — R001 Bradycardia, unspecified: Secondary | ICD-10-CM

## 2012-12-02 DIAGNOSIS — I498 Other specified cardiac arrhythmias: Secondary | ICD-10-CM

## 2012-12-02 DIAGNOSIS — L0390 Cellulitis, unspecified: Secondary | ICD-10-CM

## 2012-12-02 DIAGNOSIS — Z954 Presence of other heart-valve replacement: Secondary | ICD-10-CM

## 2012-12-02 DIAGNOSIS — Z952 Presence of prosthetic heart valve: Secondary | ICD-10-CM

## 2012-12-02 DIAGNOSIS — I739 Peripheral vascular disease, unspecified: Secondary | ICD-10-CM

## 2012-12-02 DIAGNOSIS — I4819 Other persistent atrial fibrillation: Secondary | ICD-10-CM

## 2012-12-02 MED ORDER — AMOXICILLIN-POT CLAVULANATE 875-125 MG PO TABS: 1.0000 | ORAL_TABLET | Freq: Two times a day (BID) | ORAL | Status: DC

## 2012-12-02 NOTE — Patient Instructions (Signed)
Dr. Allyson Sabal has ordered Augmentin (an antibiotic). You will take this 2 times daily for 10 days.  Follow up with an extender in 4 weeks and Dr. Allyson Sabal in 6 weeks.

## 2012-12-02 NOTE — Assessment & Plan Note (Signed)
Moderate right internal carotid artery stenosis by duplex ultrasound. He is neurologically asymptomatic.

## 2012-12-02 NOTE — Assessment & Plan Note (Signed)
Vision has venous stasis changes in both pretibial areas though he has cellulitis on his left leg. He also has severe peripheral vascular occlusive disease. I'm going to begin him on Augmentin 825 mg by mouth twice a day. He'll see mid-level provider back in one month.

## 2012-12-02 NOTE — Assessment & Plan Note (Signed)
Status post remote iliac stenting by Dr. Park Liter. I performed high-speed rotational atherectomy on him/22/13 opening up a calcified segmentally stenosed proximal right SFA. He is one-vessel runoff below the knee on the right. He also had a 90% calcified mid left SFA stenosis with three-vessel runoff. Subsequent Dopplers have shown occlusion of his right SFA with progression of disease on the left. He does complain of with a limited claudication. He is currently contemplating re\re intervention.

## 2012-12-02 NOTE — Assessment & Plan Note (Addendum)
with a bovine Magnaease  23 mm bioprosthesis 01/07/11. 2-D echo performed5/8/14 revealed normal LV systolic function with a normally functioning aortic bioprosthesis.

## 2012-12-02 NOTE — Progress Notes (Signed)
12/02/2012 Ronald [name] Vonzell Schlatter   Oct 14, 1922  [id number]  Primary Physician [name], MD Primary Cardiologist: [name] MD [facility], [facility]   HPI:The patient is a very pleasant, 79 year old, thin and frail-appearing, widowed Caucasian male, father of 3 who is accompanied by one of his daughters today. I saw him 6 months ago. He has a history of bovine aortic valve replacement January 07, 2011, with a Magna-Ease bovine 23-mm bioprosthesis for critical aortic stenosis. He had normal coronary arteries, normal LV function. He has had stenting of his right renal artery in the past, as well as both iliac arteries remotely by Dr. Kennieth Rad. He also has COPD with discontinued tobacco abuse as of February 26, 2012, treated hypertension and dyslipidemia and type 2 diabetes. He has chronic A-fib on Coumadin anticoagulation which is rate controlled. I am concerned about his fall risk. I have discussed this with the patient and his daughter. He has had attempted cardioversion which was successful initially but quickly reverted back to A-fib. He did participate in cardiac rehab. I performed Diamondback orbital rotational atherectomy and AngioSculpt angioplasty of his right SFA April 2013 with an excellent result. Unfortunately, a week later he fell and broke his right hip and had an arthroplasty. Four months after that, he fractured his left pelvis. He denies chest pain and is chronically dyspneic. Since I saw him back in the office/30/14 he has developed left pretibial cellulitis. He also complains of Bartholomay claudication. He denies chest pain or shortness of breath. He is recovered from his hip surgery that occurred a year and half ago.     Current Outpatient Prescriptions  Medication Sig Dispense Refill  . alendronate (FOSAMAX) 70 MG tablet Take 70 mg by mouth every 7 (seven) days. Take with a full glass of water on an empty stomach.      . benazepril (LOTENSIN) 10 MG tablet Take 10 mg by  mouth daily.      . clopidogrel (PLAVIX) 75 MG tablet Take 75 mg by mouth daily.      Marland Kitchen diltiazem (DILACOR XR) 240 MG 24 hr capsule Take 240 mg by mouth daily.      Marland Kitchen doxazosin (CARDURA) 4 MG tablet Take 4 mg by mouth daily.       . finasteride (PROSCAR) 5 MG tablet Take 5 mg by mouth daily.        . Fluticasone-Salmeterol (ADVAIR) 250-50 MCG/DOSE AEPB Inhale 1 puff into the lungs every 12 (twelve) hours.      . furosemide (LASIX) 40 MG tablet Take 1 tablet (40 mg total) by mouth 2 (two) times daily.  60 tablet  5  . glyBURIDE (DIABETA) 5 MG tablet Take 2.5-10 mg by mouth 2 (two) times daily with a meal. 2 tablets in the morning and 1/2 tablet in the evening      . insulin glargine (LANTUS) 100 UNIT/ML injection 18 Units every evening.       . loratadine (CLARITIN) 10 MG tablet Take 10 mg by mouth daily.      . metoprolol (LOPRESSOR) 50 MG tablet Take 50 mg by mouth 2 (two) times daily.      . potassium chloride SA (K-DUR,KLOR-CON) 20 MEQ tablet Take 20 mEq by mouth daily.      . simvastatin (ZOCOR) 20 MG tablet Take 20 mg by mouth every evening.      . tamsulosin (FLOMAX) 0.4 MG CAPS Take 0.4 mg by mouth daily after supper.      Marland Kitchen  traMADol (ULTRAM) 50 MG tablet Take 50 mg by mouth as needed for pain.      Marland Kitchen warfarin (COUMADIN) 5 MG tablet TAKE 1 TABLET DAILY AS DIRECTED.  90 tablet  0  . amoxicillin-clavulanate (AUGMENTIN) 875-125 MG per tablet Take 1 tablet by mouth 2 (two) times daily.  20 tablet  0   No current facility-administered medications for this visit.    Allergies  Allergen Reactions  . Aspirin Anaphylaxis, Shortness Of Breath and Rash    "broke out in white welts; red blotches neck and face; windpipe closing; ~ 1962"    History   Social History  . Marital Status: Widowed    Spouse Name: N/A    Number of Children: 3  . Years of Education: N/A   Occupational History  . sales    Social History Main Topics  . Smoking status: Current Every Day Smoker -- 1.00 packs/day  for 75 years    Types: Cigarettes  . Smokeless tobacco: Never Used  . Alcohol Use: 1.2 oz/week    2 Cans of beer per week  . Drug Use: No  . Sexual Activity: No   Other Topics Concern  . Not on file   Social History Narrative  . No narrative on file     Review of Systems: General: negative for chills, fever, night sweats or weight changes.  Cardiovascular: negative for chest pain, dyspnea on exertion, edema, orthopnea, palpitations, paroxysmal nocturnal dyspnea or shortness of breath Dermatological: negative for rash Respiratory: negative for cough or wheezing Urologic: negative for hematuria Abdominal: negative for nausea, vomiting, diarrhea, bright red blood per rectum, melena, or hematemesis Neurologic: negative for visual changes, syncope, or dizziness All other systems reviewed and are otherwise negative except as noted above.    Blood pressure 130/60, pulse 73, height 6' (1.829 m), weight 189 lb 8 oz (85.957 kg).  General appearance: alert and no distress Neck: no adenopathy, no JVD, supple, symmetrical, trachea midline, thyroid not enlarged, symmetric, no tenderness/mass/nodules and right carotid bruit Lungs: clear to auscultation bilaterally Heart: irregularly irregular rhythm Extremities: venous stasis dermatitis noted and cellulitis left lower extremity  EKG atrial fibrillation with a ventricular response of 73, left axis deviation and septal Q waves  ASSESSMENT AND PLAN:   Cellulitis Vision has venous stasis changes in both pretibial areas though he has cellulitis on his left leg. He also has severe peripheral vascular occlusive disease. I'm going to begin him on Augmentin 825 mg by mouth twice a day. He'll see mid-level provider back in one month.  S/P aortic valve replacement: #23 Magna Ease Edwards Pericardial Valve  November 2012  with a bovine Magnaease  23 mm bioprosthesis 01/07/11. 2-D echo performed5/8/14 revealed normal LV systolic function with a  normally functioning aortic bioprosthesis.   Persistent atrial fibrillation Rate controlled on Coumadin anticoagulation  CAROTID BRUIT Moderate right internal carotid artery stenosis by duplex ultrasound. He is neurologically asymptomatic.  PVD, Rt SFA PTA/HSRA 06/17/11 Status post remote iliac stenting by Dr. Park Liter. I performed high-speed rotational atherectomy on him/22/13 opening up a calcified segmentally stenosed proximal right SFA. He is one-vessel runoff below the knee on the right. He also had a 90% calcified mid left SFA stenosis with three-vessel runoff. Subsequent Dopplers have shown occlusion of his right SFA with progression of disease on the left. He does complain of with a limited claudication. He is currently contemplating re\re intervention.      [name] MD FACP,FACC,FAHA, [facility] 12/02/2012 5:12 PM

## 2012-12-02 NOTE — Assessment & Plan Note (Signed)
Rate controlled on Coumadin anticoagulation 

## 2012-12-03 ENCOUNTER — Encounter: Payer: Self-pay | Admitting: Cardiovascular Disease

## 2012-12-03 NOTE — Progress Notes (Signed)
Spoke with patient and he tells me that since he started taking antibiotic, his legs seem to feel better. I informed him that his test shows that his blockages have increased. He informs me that his symptoms of leg pain and fatigue has not changed. He states that he and Dr. Allyson Sabal have discussed doing a procedure, but he would like to think about it first. He will call to schedule appointment when he is ready to proceed.

## 2012-12-10 ENCOUNTER — Telehealth (HOSPITAL_COMMUNITY): Payer: Self-pay | Admitting: *Deleted

## 2012-12-10 ENCOUNTER — Ambulatory Visit: Payer: Medicare Other | Admitting: Cardiovascular Disease

## 2012-12-24 ENCOUNTER — Ambulatory Visit (INDEPENDENT_AMBULATORY_CARE_PROVIDER_SITE_OTHER): Payer: Medicare Other | Admitting: Cardiology

## 2012-12-24 ENCOUNTER — Encounter: Payer: Self-pay | Admitting: Cardiology

## 2012-12-24 ENCOUNTER — Ambulatory Visit (INDEPENDENT_AMBULATORY_CARE_PROVIDER_SITE_OTHER): Payer: Medicare Other | Admitting: Pharmacist Clinician (PhC)/ Clinical Pharmacy Specialist

## 2012-12-24 VITALS — BP 120/60 | HR 60 | Ht 72.0 in | Wt 192.0 lb

## 2012-12-24 DIAGNOSIS — Z7901 Long term (current) use of anticoagulants: Secondary | ICD-10-CM

## 2012-12-24 DIAGNOSIS — I4819 Other persistent atrial fibrillation: Secondary | ICD-10-CM

## 2012-12-24 DIAGNOSIS — J449 Chronic obstructive pulmonary disease, unspecified: Secondary | ICD-10-CM

## 2012-12-24 DIAGNOSIS — N184 Chronic kidney disease, stage 4 (severe): Secondary | ICD-10-CM

## 2012-12-24 DIAGNOSIS — I350 Nonrheumatic aortic (valve) stenosis: Secondary | ICD-10-CM

## 2012-12-24 DIAGNOSIS — I739 Peripheral vascular disease, unspecified: Secondary | ICD-10-CM | POA: Diagnosis not present

## 2012-12-24 DIAGNOSIS — L0291 Cutaneous abscess, unspecified: Secondary | ICD-10-CM

## 2012-12-24 DIAGNOSIS — Z888 Allergy status to other drugs, medicaments and biological substances status: Secondary | ICD-10-CM

## 2012-12-24 DIAGNOSIS — T39095A Adverse effect of salicylates, initial encounter: Secondary | ICD-10-CM

## 2012-12-24 DIAGNOSIS — I4891 Unspecified atrial fibrillation: Secondary | ICD-10-CM | POA: Diagnosis not present

## 2012-12-24 DIAGNOSIS — I359 Nonrheumatic aortic valve disorder, unspecified: Secondary | ICD-10-CM

## 2012-12-24 DIAGNOSIS — L0390 Cellulitis, unspecified: Secondary | ICD-10-CM

## 2012-12-24 NOTE — Patient Instructions (Addendum)
You will be instructed on the timing of stopping Coumadin and Lotensin and starting Lovenox once angiogram is scheduled.

## 2012-12-24 NOTE — Progress Notes (Signed)
INR 2.3 not 3.4

## 2012-12-24 NOTE — Assessment & Plan Note (Signed)
With known Lt SFA disease at that time.

## 2012-12-24 NOTE — Assessment & Plan Note (Signed)
Dopplers suggest restenosis of Rt SFA. He has known chronic Lt SFA disease

## 2012-12-24 NOTE — Assessment & Plan Note (Signed)
This appears to be resolved after recent OP course of antibiotics

## 2012-12-24 NOTE — Assessment & Plan Note (Addendum)
Last Scr 1.6. Will hold Lotensin pre PTA

## 2012-12-24 NOTE — Progress Notes (Signed)
12/24/2012 Ronald [name]   1922/11/09  [id number]  Primary Physicia [name], MD Primary Cardiologist: Dr Allyson Sabal  HPI:  79 y/o followed by Dr Allyson Sabal with a history of AS- s/p tissue AVR 01/07/11, COPD, diastolic dysfunction, CAF on Coumadin, and PVD. He has had prior bilat iliac stenting. In April 2013 he had Rt SFA atherectomy. At that time he was noted to have severe Lt SFA disease. Initially the plan was to intervene on the Lt but he had a fall and fractured hip with subsequent surgery May 2013. This was complicated by gluteal hematoma post op while being Coumadinized, as well as diastolic CHF and pneumonia. He has failed DCCV in the past and is in CAF on Coumadin. He saw Dr Allyson Sabal a couple of weeks ago and was put on antibiotics for cellulitis and is here today for follow up. He is much improved from the standpoint of his cellulitis. He has taken extra Lasix on occasion for edema but that seems stable today. He still has claudication in both legs. Dopplers done  11/26/12 suggest restenosis of his Rt SFA. Dr Allyson Sabal discussed angiogram and possible intervention to the Lt SFA as well as evaluation of the Rt SFA (but no PTA at the same time). The pt is agreeable and will be set up to be admitted the night before to check labs. He will need Coumadin to Lovenox crossover. I will also hold his ACE prior to his angiogram as he Stage 3-4 CRI.   Current Outpatient Prescriptions  Medication Sig Dispense Refill  . alendronate (FOSAMAX) 70 MG tablet Take 70 mg by mouth every 7 (seven) days. Take with a full glass of water on an empty stomach.      . benazepril (LOTENSIN) 10 MG tablet Take 10 mg by mouth daily.      Marland Kitchen diltiazem (DILACOR XR) 240 MG 24 hr capsule Take 240 mg by mouth daily.      Marland Kitchen doxazosin (CARDURA) 4 MG tablet Take 4 mg by mouth daily.       . finasteride (PROSCAR) 5 MG tablet Take 5 mg by mouth daily.        . Fluticasone-Salmeterol (ADVAIR) 250-50 MCG/DOSE AEPB Inhale 1 puff into the lungs  every 12 (twelve) hours.      . furosemide (LASIX) 40 MG tablet Take 1 tablet (40 mg total) by mouth 2 (two) times daily.  60 tablet  5  . glyBURIDE (DIABETA) 5 MG tablet Take 2.5-10 mg by mouth 2 (two) times daily with a meal. 2 tablets in the morning and 1/2 tablet in the evening      . insulin glargine (LANTUS) 100 UNIT/ML injection 18 Units every evening.       . loratadine (CLARITIN) 10 MG tablet Take 10 mg by mouth daily.      . metoprolol (LOPRESSOR) 50 MG tablet Take 50 mg by mouth 2 (two) times daily.      . potassium chloride SA (K-DUR,KLOR-CON) 20 MEQ tablet Take 20 mEq by mouth daily.      . simvastatin (ZOCOR) 20 MG tablet Take 20 mg by mouth every evening.      . tamsulosin (FLOMAX) 0.4 MG CAPS Take 0.4 mg by mouth daily after supper.      . traMADol (ULTRAM) 50 MG tablet Take 50 mg by mouth as needed for pain.      Marland Kitchen warfarin (COUMADIN) 5 MG tablet TAKE 1 TABLET DAILY AS DIRECTED.  90 tablet  0  .  amoxicillin-clavulanate (AUGMENTIN) 875-125 MG per tablet Take 1 tablet by mouth 2 (two) times daily.  20 tablet  0  . clopidogrel (PLAVIX) 75 MG tablet Take 75 mg by mouth daily.       No current facility-administered medications for this visit.    Allergies  Allergen Reactions  . Aspirin Anaphylaxis, Shortness Of Breath and Rash    "broke out in white welts; red blotches neck and face; windpipe closing; ~ 1962"    History   Social History  . Marital Status: Widowed    Spouse Name: N/A    Number of Children: 3  . Years of Education: N/A   Occupational History  . sales    Social History Main Topics  . Smoking status: Current Every Day Smoker -- 1.00 packs/day for 75 years    Types: Cigarettes  . Smokeless tobacco: Never Used  . Alcohol Use: 1.2 oz/week    2 Cans of beer per week  . Drug Use: No  . Sexual Activity: No   Other Topics Concern  . Not on file   Social History Narrative  . No narrative on file     Review of Systems: General: negative for chills,  fever, night sweats or weight changes.  Cardiovascular: negative for chest pain, dyspnea on exertion, edema, orthopnea, palpitations, paroxysmal nocturnal dyspnea or shortness of breath Dermatological: negative for rash Respiratory: negative for cough or wheezing Urologic: negative for hematuria Abdominal: negative for nausea, vomiting, diarrhea, bright red blood per rectum, melena, or hematemesis Neurologic: negative for visual changes, syncope, or dizziness All other systems reviewed and are otherwise negative except as noted above.    Blood pressure 120/60, pulse 60, height 6' (1.829 m), weight 192 lb (87.091 kg).  General appearance: alert, cooperative and no distress Neck: RCA bruit Lungs: clear to auscultation bilaterally Heart: irregularly irregular rhythm Extremities: trace edema, chronic venous skin changes Pulses: diminnished Skin: cool and dry Neurologic: Grossly normal  ASSESSMENT AND PLAN:   Bilateral claudication of lower limb Dopplers suggest restenosis of Rt SFA. He has known chronic Lt SFA disease  Persistent atrial fibrillation .  COPD (chronic obstructive pulmonary disease) .  Chronic renal insufficiency, stage IV (severe) Last Scr 1.6. Will hold Lotensin pre PTA  Cellulitis This appears to be resolved after recent OP course of antibiotics  AS (aortic stenosis)- s/p tissue AVR 01/07/11 .  Aspirin allergy, rash, SOB. Pt is on Plavix .  PVD, Rt SFA PTA/HSRA 06/17/11 With known Lt SFA disease at that time.   PLAN  Discussed with Dr Allyson Sabal- admit the night before PV angiogram for labs, possible hydration. Coumadin to Lovenox. Hold ACE pre angiogram, continue Lasix. The plan is to do Lt SFA and evaluate Rt SFA.   Joice Nazario KPA-C 12/24/2012 11:08 AM

## 2012-12-25 ENCOUNTER — Telehealth: Payer: Self-pay | Admitting: Cardiovascular Disease

## 2012-12-25 NOTE — Telephone Encounter (Signed)
Strahan states that Mr. Mclean wants to think about having this procedure a little longer.  Stites has some question herself, if you could call her Monday before 11:00 am or after 4:30pm.

## 2012-12-25 NOTE — Telephone Encounter (Signed)
lmom 

## 2012-12-29 ENCOUNTER — Telehealth: Payer: Self-pay | Admitting: Cardiovascular Disease

## 2012-12-29 NOTE — Telephone Encounter (Signed)
Per the answering service-Kathryn please call before 11A.M.today.

## 2012-12-29 NOTE — Telephone Encounter (Signed)
I spoke with Annice Pih.  Please see other telephone note

## 2012-12-29 NOTE — Telephone Encounter (Signed)
I spoke with Annice Pih and reviewed the procedure for an angiogram.  She will talk to Mr prithvi kooi and let us know what the decision is.

## 2013-01-01 ENCOUNTER — Ambulatory Visit: Payer: Medicare Other | Admitting: Physician Assistant

## 2013-01-19 ENCOUNTER — Ambulatory Visit (INDEPENDENT_AMBULATORY_CARE_PROVIDER_SITE_OTHER): Payer: Medicare Other | Admitting: Pharmacist Clinician (PhC)/ Clinical Pharmacy Specialist

## 2013-01-19 VITALS — BP 146/60 | HR 68

## 2013-01-19 DIAGNOSIS — Z7901 Long term (current) use of anticoagulants: Secondary | ICD-10-CM

## 2013-01-19 DIAGNOSIS — I4891 Unspecified atrial fibrillation: Secondary | ICD-10-CM | POA: Diagnosis not present

## 2013-01-19 DIAGNOSIS — I4819 Other persistent atrial fibrillation: Secondary | ICD-10-CM

## 2013-01-19 LAB — POCT INR: INR: 2.4

## 2013-01-22 ENCOUNTER — Other Ambulatory Visit: Payer: Self-pay | Admitting: Pharmacist Clinician (PhC)/ Clinical Pharmacy Specialist

## 2013-01-25 DIAGNOSIS — J449 Chronic obstructive pulmonary disease, unspecified: Secondary | ICD-10-CM | POA: Diagnosis not present

## 2013-01-25 DIAGNOSIS — Z6825 Body mass index (BMI) 25.0-25.9, adult: Secondary | ICD-10-CM | POA: Diagnosis not present

## 2013-01-25 DIAGNOSIS — M8448XA Pathological fracture, other site, initial encounter for fracture: Secondary | ICD-10-CM | POA: Diagnosis not present

## 2013-01-25 DIAGNOSIS — I4891 Unspecified atrial fibrillation: Secondary | ICD-10-CM | POA: Diagnosis not present

## 2013-01-25 DIAGNOSIS — M47814 Spondylosis without myelopathy or radiculopathy, thoracic region: Secondary | ICD-10-CM | POA: Diagnosis not present

## 2013-01-25 DIAGNOSIS — M47817 Spondylosis without myelopathy or radiculopathy, lumbosacral region: Secondary | ICD-10-CM | POA: Diagnosis not present

## 2013-01-25 DIAGNOSIS — M899 Disorder of bone, unspecified: Secondary | ICD-10-CM | POA: Diagnosis not present

## 2013-01-25 DIAGNOSIS — I10 Essential (primary) hypertension: Secondary | ICD-10-CM | POA: Diagnosis not present

## 2013-01-25 DIAGNOSIS — I509 Heart failure, unspecified: Secondary | ICD-10-CM | POA: Diagnosis not present

## 2013-02-11 DIAGNOSIS — E1159 Type 2 diabetes mellitus with other circulatory complications: Secondary | ICD-10-CM | POA: Diagnosis not present

## 2013-02-11 DIAGNOSIS — E1149 Type 2 diabetes mellitus with other diabetic neurological complication: Secondary | ICD-10-CM | POA: Diagnosis not present

## 2013-02-11 DIAGNOSIS — M810 Age-related osteoporosis without current pathological fracture: Secondary | ICD-10-CM | POA: Diagnosis not present

## 2013-02-11 DIAGNOSIS — M545 Low back pain: Secondary | ICD-10-CM | POA: Diagnosis not present

## 2013-02-11 DIAGNOSIS — I10 Essential (primary) hypertension: Secondary | ICD-10-CM | POA: Diagnosis not present

## 2013-02-11 DIAGNOSIS — M25559 Pain in unspecified hip: Secondary | ICD-10-CM | POA: Diagnosis not present

## 2013-02-11 DIAGNOSIS — I509 Heart failure, unspecified: Secondary | ICD-10-CM | POA: Diagnosis not present

## 2013-02-11 DIAGNOSIS — E785 Hyperlipidemia, unspecified: Secondary | ICD-10-CM | POA: Diagnosis not present

## 2013-02-16 ENCOUNTER — Ambulatory Visit (INDEPENDENT_AMBULATORY_CARE_PROVIDER_SITE_OTHER): Payer: Medicare Other | Admitting: Pharmacist Clinician (PhC)/ Clinical Pharmacy Specialist

## 2013-02-16 VITALS — BP 136/70 | HR 68

## 2013-02-16 DIAGNOSIS — Z7901 Long term (current) use of anticoagulants: Secondary | ICD-10-CM | POA: Diagnosis not present

## 2013-02-16 DIAGNOSIS — I4891 Unspecified atrial fibrillation: Secondary | ICD-10-CM

## 2013-02-16 DIAGNOSIS — I4819 Other persistent atrial fibrillation: Secondary | ICD-10-CM

## 2013-02-16 LAB — POCT INR: INR: 2.2

## 2013-02-24 DIAGNOSIS — M25559 Pain in unspecified hip: Secondary | ICD-10-CM | POA: Diagnosis not present

## 2013-03-16 ENCOUNTER — Ambulatory Visit (INDEPENDENT_AMBULATORY_CARE_PROVIDER_SITE_OTHER): Payer: Medicare Other | Admitting: Pharmacist Clinician (PhC)/ Clinical Pharmacy Specialist

## 2013-03-16 VITALS — BP 148/60 | HR 84

## 2013-03-16 DIAGNOSIS — I4891 Unspecified atrial fibrillation: Secondary | ICD-10-CM

## 2013-03-16 DIAGNOSIS — Z7901 Long term (current) use of anticoagulants: Secondary | ICD-10-CM

## 2013-03-16 DIAGNOSIS — I4819 Other persistent atrial fibrillation: Secondary | ICD-10-CM

## 2013-03-16 LAB — POCT INR: INR: 1.8

## 2013-03-29 DIAGNOSIS — L820 Inflamed seborrheic keratosis: Secondary | ICD-10-CM | POA: Diagnosis not present

## 2013-03-29 DIAGNOSIS — L570 Actinic keratosis: Secondary | ICD-10-CM | POA: Diagnosis not present

## 2013-03-30 DIAGNOSIS — N183 Chronic kidney disease, stage 3 unspecified: Secondary | ICD-10-CM | POA: Diagnosis not present

## 2013-03-30 DIAGNOSIS — I4891 Unspecified atrial fibrillation: Secondary | ICD-10-CM | POA: Diagnosis not present

## 2013-03-30 DIAGNOSIS — Z954 Presence of other heart-valve replacement: Secondary | ICD-10-CM | POA: Diagnosis not present

## 2013-03-30 DIAGNOSIS — E785 Hyperlipidemia, unspecified: Secondary | ICD-10-CM | POA: Diagnosis not present

## 2013-03-30 DIAGNOSIS — M25559 Pain in unspecified hip: Secondary | ICD-10-CM | POA: Diagnosis not present

## 2013-03-30 DIAGNOSIS — M79609 Pain in unspecified limb: Secondary | ICD-10-CM | POA: Diagnosis not present

## 2013-03-30 DIAGNOSIS — I509 Heart failure, unspecified: Secondary | ICD-10-CM | POA: Diagnosis not present

## 2013-03-30 DIAGNOSIS — E1149 Type 2 diabetes mellitus with other diabetic neurological complication: Secondary | ICD-10-CM | POA: Diagnosis not present

## 2013-04-06 ENCOUNTER — Ambulatory Visit (INDEPENDENT_AMBULATORY_CARE_PROVIDER_SITE_OTHER): Payer: Medicare Other | Admitting: Pharmacist Clinician (PhC)/ Clinical Pharmacy Specialist

## 2013-04-06 VITALS — BP 170/84 | HR 80

## 2013-04-06 DIAGNOSIS — I4891 Unspecified atrial fibrillation: Secondary | ICD-10-CM

## 2013-04-06 DIAGNOSIS — Z7901 Long term (current) use of anticoagulants: Secondary | ICD-10-CM

## 2013-04-06 DIAGNOSIS — I4819 Other persistent atrial fibrillation: Secondary | ICD-10-CM

## 2013-04-06 LAB — POCT INR: INR: 2.5

## 2013-04-10 ENCOUNTER — Inpatient Hospital Stay (HOSPITAL_COMMUNITY)
Admission: EM | Admit: 2013-04-10 | Discharge: 2013-04-15 | DRG: 536 | Disposition: A | Discharge status: Skilled Nursing Facility | Payer: Medicare Other | Attending: Internal Medicine | Admitting: Internal Medicine

## 2013-04-10 ENCOUNTER — Encounter (HOSPITAL_COMMUNITY): Payer: Self-pay | Admitting: Emergency Medicine

## 2013-04-10 DIAGNOSIS — M25559 Pain in unspecified hip: Secondary | ICD-10-CM | POA: Diagnosis not present

## 2013-04-10 DIAGNOSIS — Z79899 Other long term (current) drug therapy: Secondary | ICD-10-CM

## 2013-04-10 DIAGNOSIS — Z823 Family history of stroke: Secondary | ICD-10-CM

## 2013-04-10 DIAGNOSIS — F3289 Other specified depressive episodes: Secondary | ICD-10-CM | POA: Diagnosis present

## 2013-04-10 DIAGNOSIS — E1149 Type 2 diabetes mellitus with other diabetic neurological complication: Secondary | ICD-10-CM | POA: Diagnosis present

## 2013-04-10 DIAGNOSIS — F329 Major depressive disorder, single episode, unspecified: Secondary | ICD-10-CM | POA: Diagnosis present

## 2013-04-10 DIAGNOSIS — N400 Enlarged prostate without lower urinary tract symptoms: Secondary | ICD-10-CM | POA: Diagnosis present

## 2013-04-10 DIAGNOSIS — Z9181 History of falling: Secondary | ICD-10-CM | POA: Diagnosis not present

## 2013-04-10 DIAGNOSIS — N184 Chronic kidney disease, stage 4 (severe): Secondary | ICD-10-CM

## 2013-04-10 DIAGNOSIS — R262 Difficulty in walking, not elsewhere classified: Secondary | ICD-10-CM | POA: Diagnosis not present

## 2013-04-10 DIAGNOSIS — D72829 Elevated white blood cell count, unspecified: Secondary | ICD-10-CM | POA: Diagnosis not present

## 2013-04-10 DIAGNOSIS — Z8249 Family history of ischemic heart disease and other diseases of the circulatory system: Secondary | ICD-10-CM

## 2013-04-10 DIAGNOSIS — I509 Heart failure, unspecified: Secondary | ICD-10-CM | POA: Diagnosis not present

## 2013-04-10 DIAGNOSIS — I4891 Unspecified atrial fibrillation: Secondary | ICD-10-CM | POA: Diagnosis not present

## 2013-04-10 DIAGNOSIS — J4489 Other specified chronic obstructive pulmonary disease: Secondary | ICD-10-CM | POA: Diagnosis not present

## 2013-04-10 DIAGNOSIS — D696 Thrombocytopenia, unspecified: Secondary | ICD-10-CM | POA: Diagnosis not present

## 2013-04-10 DIAGNOSIS — Z8551 Personal history of malignant neoplasm of bladder: Secondary | ICD-10-CM | POA: Diagnosis not present

## 2013-04-10 DIAGNOSIS — Z794 Long term (current) use of insulin: Secondary | ICD-10-CM

## 2013-04-10 DIAGNOSIS — M129 Arthropathy, unspecified: Secondary | ICD-10-CM

## 2013-04-10 DIAGNOSIS — I5032 Chronic diastolic (congestive) heart failure: Secondary | ICD-10-CM | POA: Diagnosis not present

## 2013-04-10 DIAGNOSIS — S72009D Fracture of unspecified part of neck of unspecified femur, subsequent encounter for closed fracture with routine healing: Secondary | ICD-10-CM | POA: Diagnosis not present

## 2013-04-10 DIAGNOSIS — Z87891 Personal history of nicotine dependence: Secondary | ICD-10-CM

## 2013-04-10 DIAGNOSIS — W19XXXA Unspecified fall, initial encounter: Secondary | ICD-10-CM | POA: Diagnosis present

## 2013-04-10 DIAGNOSIS — I739 Peripheral vascular disease, unspecified: Secondary | ICD-10-CM | POA: Diagnosis present

## 2013-04-10 DIAGNOSIS — F17200 Nicotine dependence, unspecified, uncomplicated: Secondary | ICD-10-CM | POA: Diagnosis present

## 2013-04-10 DIAGNOSIS — E119 Type 2 diabetes mellitus without complications: Secondary | ICD-10-CM | POA: Diagnosis not present

## 2013-04-10 DIAGNOSIS — R0989 Other specified symptoms and signs involving the circulatory and respiratory systems: Secondary | ICD-10-CM | POA: Diagnosis not present

## 2013-04-10 DIAGNOSIS — S32509A Unspecified fracture of unspecified pubis, initial encounter for closed fracture: Secondary | ICD-10-CM | POA: Diagnosis present

## 2013-04-10 DIAGNOSIS — Z96649 Presence of unspecified artificial hip joint: Secondary | ICD-10-CM | POA: Diagnosis not present

## 2013-04-10 DIAGNOSIS — I2510 Atherosclerotic heart disease of native coronary artery without angina pectoris: Secondary | ICD-10-CM | POA: Diagnosis present

## 2013-04-10 DIAGNOSIS — Z952 Presence of prosthetic heart valve: Secondary | ICD-10-CM | POA: Diagnosis not present

## 2013-04-10 DIAGNOSIS — I4819 Other persistent atrial fibrillation: Secondary | ICD-10-CM

## 2013-04-10 DIAGNOSIS — E1142 Type 2 diabetes mellitus with diabetic polyneuropathy: Secondary | ICD-10-CM | POA: Diagnosis present

## 2013-04-10 DIAGNOSIS — E118 Type 2 diabetes mellitus with unspecified complications: Secondary | ICD-10-CM | POA: Diagnosis present

## 2013-04-10 DIAGNOSIS — I5033 Acute on chronic diastolic (congestive) heart failure: Secondary | ICD-10-CM | POA: Diagnosis present

## 2013-04-10 DIAGNOSIS — J449 Chronic obstructive pulmonary disease, unspecified: Secondary | ICD-10-CM | POA: Diagnosis present

## 2013-04-10 DIAGNOSIS — S32409A Unspecified fracture of unspecified acetabulum, initial encounter for closed fracture: Secondary | ICD-10-CM | POA: Diagnosis not present

## 2013-04-10 DIAGNOSIS — S32401A Unspecified fracture of right acetabulum, initial encounter for closed fracture: Secondary | ICD-10-CM | POA: Diagnosis present

## 2013-04-10 DIAGNOSIS — Z7901 Long term (current) use of anticoagulants: Secondary | ICD-10-CM | POA: Diagnosis not present

## 2013-04-10 DIAGNOSIS — J90 Pleural effusion, not elsewhere classified: Secondary | ICD-10-CM | POA: Diagnosis not present

## 2013-04-10 DIAGNOSIS — S79929A Unspecified injury of unspecified thigh, initial encounter: Secondary | ICD-10-CM | POA: Diagnosis not present

## 2013-04-10 DIAGNOSIS — E785 Hyperlipidemia, unspecified: Secondary | ICD-10-CM | POA: Diagnosis not present

## 2013-04-10 DIAGNOSIS — I359 Nonrheumatic aortic valve disorder, unspecified: Secondary | ICD-10-CM | POA: Diagnosis not present

## 2013-04-10 DIAGNOSIS — M6281 Muscle weakness (generalized): Secondary | ICD-10-CM | POA: Diagnosis not present

## 2013-04-10 DIAGNOSIS — I350 Nonrheumatic aortic (valve) stenosis: Secondary | ICD-10-CM | POA: Diagnosis present

## 2013-04-10 DIAGNOSIS — I482 Chronic atrial fibrillation, unspecified: Secondary | ICD-10-CM | POA: Diagnosis present

## 2013-04-10 DIAGNOSIS — S79919A Unspecified injury of unspecified hip, initial encounter: Secondary | ICD-10-CM | POA: Diagnosis not present

## 2013-04-10 DIAGNOSIS — S72009A Fracture of unspecified part of neck of unspecified femur, initial encounter for closed fracture: Secondary | ICD-10-CM | POA: Diagnosis not present

## 2013-04-10 DIAGNOSIS — R279 Unspecified lack of coordination: Secondary | ICD-10-CM | POA: Diagnosis not present

## 2013-04-10 DIAGNOSIS — I10 Essential (primary) hypertension: Secondary | ICD-10-CM | POA: Diagnosis not present

## 2013-04-10 DIAGNOSIS — J9819 Other pulmonary collapse: Secondary | ICD-10-CM | POA: Diagnosis not present

## 2013-04-10 DIAGNOSIS — S0990XA Unspecified injury of head, initial encounter: Secondary | ICD-10-CM | POA: Diagnosis not present

## 2013-04-10 MED ORDER — FENTANYL CITRATE 0.05 MG/ML IJ SOLN
50.0000 ug | Freq: Once | INTRAMUSCULAR | Status: AC
  Administered 2013-04-11: 50 ug via INTRAVENOUS
  Filled 2013-04-10: qty 2

## 2013-04-10 NOTE — ED Notes (Signed)
Patient is alert and oriented x3.  He states that he was at [employer] and had a missed step and fell on his right hip.  Patient denies any LOC.

## 2013-04-10 NOTE — ED Notes (Signed)
Pt fell while trying to get in the car, hit concrete floor landing on right hip, denies hitting his head, states he "feels like he broke his right hip" which he says he has broke it previously. C/O elbow pain.

## 2013-04-11 ENCOUNTER — Inpatient Hospital Stay (HOSPITAL_COMMUNITY): Payer: Medicare Other

## 2013-04-11 ENCOUNTER — Emergency Department (HOSPITAL_COMMUNITY): Payer: Medicare Other

## 2013-04-11 ENCOUNTER — Encounter (HOSPITAL_COMMUNITY): Payer: Self-pay | Admitting: Oncology

## 2013-04-11 DIAGNOSIS — I5032 Chronic diastolic (congestive) heart failure: Secondary | ICD-10-CM

## 2013-04-11 DIAGNOSIS — J449 Chronic obstructive pulmonary disease, unspecified: Secondary | ICD-10-CM

## 2013-04-11 DIAGNOSIS — E119 Type 2 diabetes mellitus without complications: Secondary | ICD-10-CM

## 2013-04-11 DIAGNOSIS — Z87891 Personal history of nicotine dependence: Secondary | ICD-10-CM

## 2013-04-11 DIAGNOSIS — I739 Peripheral vascular disease, unspecified: Secondary | ICD-10-CM

## 2013-04-11 DIAGNOSIS — I10 Essential (primary) hypertension: Secondary | ICD-10-CM

## 2013-04-11 DIAGNOSIS — Z954 Presence of other heart-valve replacement: Secondary | ICD-10-CM

## 2013-04-11 DIAGNOSIS — I4891 Unspecified atrial fibrillation: Secondary | ICD-10-CM

## 2013-04-11 DIAGNOSIS — S32401A Unspecified fracture of right acetabulum, initial encounter for closed fracture: Secondary | ICD-10-CM | POA: Diagnosis present

## 2013-04-11 DIAGNOSIS — S32409A Unspecified fracture of unspecified acetabulum, initial encounter for closed fracture: Secondary | ICD-10-CM | POA: Insufficient documentation

## 2013-04-11 LAB — URINALYSIS, ROUTINE W REFLEX MICROSCOPIC
Bilirubin Urine: NEGATIVE
Glucose, UA: 250 mg/dL — AB
HGB URINE DIPSTICK: NEGATIVE
Ketones, ur: NEGATIVE mg/dL
Leukocytes, UA: NEGATIVE
NITRITE: NEGATIVE
Protein, ur: 100 mg/dL — AB
Specific Gravity, Urine: 1.018 (ref 1.005–1.030)
UROBILINOGEN UA: 1 mg/dL (ref 0.0–1.0)
pH: 6.5 (ref 5.0–8.0)

## 2013-04-11 LAB — COMPREHENSIVE METABOLIC PANEL
ALK PHOS: 88 U/L (ref 39–117)
ALT: 28 U/L (ref 0–53)
AST: 25 U/L (ref 0–37)
Albumin: 3.7 g/dL (ref 3.5–5.2)
BILIRUBIN TOTAL: 0.7 mg/dL (ref 0.3–1.2)
BUN: 22 mg/dL (ref 6–23)
CHLORIDE: 101 meq/L (ref 96–112)
CO2: 28 meq/L (ref 19–32)
CREATININE: 1.29 mg/dL (ref 0.50–1.35)
Calcium: 9.8 mg/dL (ref 8.4–10.5)
GFR calc Af Amer: 55 mL/min — ABNORMAL LOW (ref >90)
GFR calc non Af Amer: 47 mL/min — ABNORMAL LOW (ref >90)
Glucose, Bld: 187 mg/dL — ABNORMAL HIGH (ref 70–99)
Potassium: 3.8 mEq/L (ref 3.7–5.3)
Sodium: 141 mEq/L (ref 137–147)
Total Protein: 7.3 g/dL (ref 6.0–8.3)

## 2013-04-11 LAB — CBC WITH DIFFERENTIAL/PLATELET
BASOS PCT: 0 % (ref 0–1)
Basophils Absolute: 0 10*3/uL (ref 0.0–0.1)
EOS PCT: 1 % (ref 0–5)
Eosinophils Absolute: 0.2 10*3/uL (ref 0.0–0.7)
HEMATOCRIT: 41.2 % (ref 39.0–52.0)
HEMOGLOBIN: 14.3 g/dL (ref 13.0–17.0)
LYMPHS ABS: 1.5 10*3/uL (ref 0.7–4.0)
Lymphocytes Relative: 8 % — ABNORMAL LOW (ref 12–46)
MCH: 32.2 pg (ref 26.0–34.0)
MCHC: 34.7 g/dL (ref 30.0–36.0)
MCV: 92.8 fL (ref 78.0–100.0)
Monocytes Absolute: 0.7 10*3/uL (ref 0.1–1.0)
Monocytes Relative: 4 % (ref 3–12)
NEUTROS ABS: 16 10*3/uL — AB (ref 1.7–7.7)
Neutrophils Relative %: 87 % — ABNORMAL HIGH (ref 43–77)
Platelets: 87 10*3/uL — ABNORMAL LOW (ref 150–400)
RBC: 4.44 MIL/uL (ref 4.22–5.81)
RDW: 14.3 % (ref 11.5–15.5)
WBC: 18.4 10*3/uL — AB (ref 4.0–10.5)

## 2013-04-11 LAB — PROTIME-INR
INR: 2.03 — AB (ref 0.00–1.49)
Prothrombin Time: 22.3 seconds — ABNORMAL HIGH (ref 11.6–15.2)

## 2013-04-11 LAB — URINE MICROSCOPIC-ADD ON

## 2013-04-11 LAB — TYPE AND SCREEN
ABO/RH(D): O POS
Antibody Screen: NEGATIVE

## 2013-04-11 LAB — APTT: APTT: 43 s — AB (ref 24–37)

## 2013-04-11 LAB — TROPONIN I: Troponin I: <0.3 ng/mL (ref <0.30)

## 2013-04-11 MED ORDER — CYCLOBENZAPRINE HCL 5 MG PO TABS
7.5000 mg | ORAL_TABLET | Freq: Three times a day (TID) | ORAL | Status: DC | PRN
  Administered 2013-04-11: 7.5 mg via ORAL
  Filled 2013-04-11: qty 1.5

## 2013-04-11 MED ORDER — HYDROMORPHONE HCL PF 1 MG/ML IJ SOLN
0.5000 mg | INTRAMUSCULAR | Status: DC | PRN
  Administered 2013-04-12: 0.5 mg via INTRAVENOUS
  Filled 2013-04-11: qty 1

## 2013-04-11 MED ORDER — METOPROLOL TARTRATE 50 MG PO TABS
50.0000 mg | ORAL_TABLET | Freq: Two times a day (BID) | ORAL | Status: DC
  Administered 2013-04-11 – 2013-04-15 (×9): 50 mg via ORAL
  Filled 2013-04-11 (×10): qty 1

## 2013-04-11 MED ORDER — WARFARIN - PHARMACIST DOSING INPATIENT: Freq: Every day | Status: DC

## 2013-04-11 MED ORDER — FENTANYL CITRATE 0.05 MG/ML IJ SOLN
50.0000 ug | Freq: Once | INTRAMUSCULAR | Status: AC
  Administered 2013-04-11: 02:00:00 via INTRAVENOUS
  Filled 2013-04-11: qty 2

## 2013-04-11 MED ORDER — INSULIN ASPART 100 UNIT/ML ~~LOC~~ SOLN
0.0000 [IU] | Freq: Three times a day (TID) | SUBCUTANEOUS | Status: DC
  Administered 2013-04-11: 2 [IU] via SUBCUTANEOUS
  Administered 2013-04-11 (×2): 3 [IU] via SUBCUTANEOUS
  Administered 2013-04-12 (×2): 5 [IU] via SUBCUTANEOUS
  Administered 2013-04-12: 2 [IU] via SUBCUTANEOUS
  Administered 2013-04-13 (×2): 5 [IU] via SUBCUTANEOUS
  Administered 2013-04-14 (×2): 3 [IU] via SUBCUTANEOUS
  Administered 2013-04-14: 2 [IU] via SUBCUTANEOUS
  Administered 2013-04-15: 5 [IU] via SUBCUTANEOUS
  Administered 2013-04-15: 2 [IU] via SUBCUTANEOUS

## 2013-04-11 MED ORDER — HYDRALAZINE HCL 20 MG/ML IJ SOLN
10.0000 mg | INTRAMUSCULAR | Status: DC | PRN
Start: 2013-04-11 — End: 2013-04-15
  Filled 2013-04-11: qty 0.5

## 2013-04-11 MED ORDER — INSULIN ASPART 100 UNIT/ML ~~LOC~~ SOLN
0.0000 [IU] | Freq: Every day | SUBCUTANEOUS | Status: DC
  Administered 2013-04-12: 2 [IU] via SUBCUTANEOUS
  Administered 2013-04-13 – 2013-04-14 (×2): 3 [IU] via SUBCUTANEOUS

## 2013-04-11 MED ORDER — HYDROMORPHONE HCL PF 1 MG/ML IJ SOLN: 0.5000 mg | INTRAMUSCULAR | Status: DC | PRN

## 2013-04-11 MED ORDER — DOXAZOSIN MESYLATE 4 MG PO TABS
4.0000 mg | ORAL_TABLET | Freq: Every day | ORAL | Status: DC
  Administered 2013-04-11 – 2013-04-15 (×5): 4 mg via ORAL
  Filled 2013-04-11 (×5): qty 1

## 2013-04-11 MED ORDER — HYDROCODONE-ACETAMINOPHEN 5-325 MG PO TABS
1.0000 | ORAL_TABLET | Freq: Four times a day (QID) | ORAL | Status: DC | PRN
  Administered 2013-04-11 – 2013-04-12 (×2): 2 via ORAL
  Filled 2013-04-11 (×2): qty 2

## 2013-04-11 MED ORDER — WARFARIN SODIUM 2.5 MG PO TABS
2.5000 mg | ORAL_TABLET | Freq: Once | ORAL | Status: AC
  Administered 2013-04-11: 2.5 mg via ORAL
  Filled 2013-04-11: qty 1

## 2013-04-11 MED ORDER — LORATADINE 10 MG PO TABS
10.0000 mg | ORAL_TABLET | Freq: Every day | ORAL | Status: DC
  Administered 2013-04-11 – 2013-04-15 (×5): 10 mg via ORAL
  Filled 2013-04-11 (×5): qty 1

## 2013-04-11 MED ORDER — INSULIN GLARGINE 100 UNIT/ML ~~LOC~~ SOLN
19.0000 [IU] | Freq: Every evening | SUBCUTANEOUS | Status: DC
  Administered 2013-04-11 – 2013-04-14 (×4): 19 [IU] via SUBCUTANEOUS
  Filled 2013-04-11 (×6): qty 0.19

## 2013-04-11 MED ORDER — FINASTERIDE 5 MG PO TABS
5.0000 mg | ORAL_TABLET | Freq: Every day | ORAL | Status: DC
  Administered 2013-04-11 – 2013-04-15 (×5): 5 mg via ORAL
  Filled 2013-04-11 (×5): qty 1

## 2013-04-11 MED ORDER — HYDROMORPHONE HCL PF 1 MG/ML IJ SOLN
0.5000 mg | Freq: Once | INTRAMUSCULAR | Status: DC
  Filled 2013-04-11: qty 1

## 2013-04-11 MED ORDER — BENAZEPRIL HCL 10 MG PO TABS
10.0000 mg | ORAL_TABLET | Freq: Every day | ORAL | Status: DC
  Administered 2013-04-11 – 2013-04-15 (×5): 10 mg via ORAL
  Filled 2013-04-11 (×5): qty 1

## 2013-04-11 MED ORDER — SIMVASTATIN 20 MG PO TABS
20.0000 mg | ORAL_TABLET | Freq: Every evening | ORAL | Status: DC
  Administered 2013-04-11 – 2013-04-14 (×4): 20 mg via ORAL
  Filled 2013-04-11 (×5): qty 1

## 2013-04-11 MED ORDER — TRAMADOL HCL 50 MG PO TABS
50.0000 mg | ORAL_TABLET | Freq: Two times a day (BID) | ORAL | Status: DC
  Administered 2013-04-11 – 2013-04-15 (×9): 50 mg via ORAL
  Filled 2013-04-11 (×9): qty 1

## 2013-04-11 MED ORDER — SENNA 8.6 MG PO TABS
1.0000 | ORAL_TABLET | Freq: Two times a day (BID) | ORAL | Status: DC
  Administered 2013-04-11 – 2013-04-15 (×9): 8.6 mg via ORAL
  Filled 2013-04-11 (×8): qty 1

## 2013-04-11 NOTE — Progress Notes (Signed)
ANTICOAGULATION CONSULT NOTE - Initial Consult  Pharmacy Consult for Warfarin Indication: atrial fibrillation  Allergies  Allergen Reactions  . Aspirin Anaphylaxis, Shortness Of Breath and Rash    "broke out in white welts; red blotches neck and face; windpipe closing; ~ 1962"    Patient Measurements:    Vital Signs: Temp: 98.5 F (36.9 C) (02/15 0244) Temp src: Oral (02/14 2228) BP: 197/78 mmHg (02/15 0242) Pulse Rate: 65 (02/14 2228)  Labs:  Recent Labs  04/11/13 0050  HGB 14.3  HCT 41.2  PLT 87*  APTT 43*  LABPROT 22.3*  INR 2.03*  CREATININE 1.29  TROPONINI <0.30    The CrCl is unknown because both a height and weight (above a minimum accepted value) are required for this calculation.   Medical History: Past Medical History  Diagnosis Date  . AS (aortic stenosis)     bovine aortic valve replacement 01/07/11  . HTN (hypertension)   . Thrombocytopenia due to drugs     seen by Dr Inda Merlin plts [id number] no rx  . Peripheral vascular disease very poor circulation legs and feet ... stents right and left legs... done in dr j. Gwenlyn Found 's office.   . Depression wife died 4 years ago.    Marland Kitchen COPD (chronic obstructive pulmonary disease)   . Shortness of breath   . Recurrent upper respiratory infection (URI)     sinusitis  . Claudication in peripheral vascular disease 06/17/2011  . S/P angioplasty with stent, diamond back rotational athrectomy Prox. Rt. SFA 06/17/2011 06/17/2011  . Neuropathy due to secondary diabetes   . Type II diabetes mellitus   . Pneumonia 07/25/11    left  . Persistent atrial fibrillation   . Blood transfusion     w/hip operation  . History of stomach ulcers ~ 1951  . Renal artery stenosis 2006    renal artery stent  . Bladder cancer     Bladder Cancer local  . Cellulitis of left lower extremity   . Chronic diastolic congestive heart failure   . Normal coronary arteries Sept 2012    Medications:  Scheduled:  . benazepril  10 mg Oral Daily   . doxazosin  4 mg Oral Daily  . finasteride  5 mg Oral Daily  . insulin aspart  0-5 Units Subcutaneous QHS  . insulin aspart  0-9 Units Subcutaneous TID WC  . insulin glargine  19 Units Subcutaneous QPM  . loratadine  10 mg Oral Daily  . metoprolol  50 mg Oral BID  . senna  1 tablet Oral BID  . simvastatin  20 mg Oral QPM  . traMADol  50 mg Oral BID  . Warfarin - Pharmacist Dosing Inpatient   Does not apply q1800   Infusions:    Assessment:  80 year old male s/p fall  Pt on warfarin 2.5mg  daily PTA for h/o AFib.  Pt reports last dose taken on 2/14 and INR = 2.03  CT head = no injury  Pt sustained minimally displaced right hip fracture.  Plan on non-operative treatment.    Warfarin per pharmacy dosing to be continued   Goal of Therapy:  INR 2-3   Plan:  Warfarin 2.5mg  po x 1 today Check daily PT/INR  Everette Rank, PharmD 04/11/2013,4:08 AM

## 2013-04-11 NOTE — Progress Notes (Signed)
Utilization review completed.  

## 2013-04-11 NOTE — Progress Notes (Signed)
[facility] PROGRESS NOTE  Ronald [name] [id number] DOB: [birth year] DOA: 04/10/2013 PCP: Precious Reel, MD  Assessment/Plan: 1. Closed right acetabular fracture. Patient having a mechanical fall the evening of admission subsequently unable to bear weight. Imaging studies in the emergency department revealing a closed minimally displaced right acetabular fracture. He was seen by orthopedic surgery who recommended conservative management. Consult physical therapy, social work for SNF placement. 2. Leukocytosis. Initial lab work showing a white count of 18,400. Chest x-ray did not show evidence of pneumonia nor did UA show evidence of infection. He has been afebrile throughout the day. Plan to repeat chest x-ray in a.m. meanwhile hold off on antimicrobial therapy for now. It is possible that this may be related to fracture. Follow up on am labs.  3. Atrial fibrillation. Rate controlled continue Lopressor 50 mg by mouth twice a day. 4. Anticoagulation for history of atrial fibrillation. Therapeutic at 2.03 with PT of 22.3, pharmacy consult for Coumadin management. 5. HTN. Patient initially presented with elevated blood pressures likely secondary to pain. Continue metoprolol 50 mg by mouth twice a day and benazepril 10 mg by mouth daily. 6. Type 2 diabetes mellitus, insulin-dependent. Continue Lantus 19 units subcutaneous daily with Accu-Cheks q. a.c. and each bedtime.  Code Status: Full Code Disposition Plan: PT and SW consult, patient will likely require SNF placement.    Consultants:  Orthopedic surgery  Physical therapy  Social work   HPI/Subjective: Patient is a pleasant 80 year old gentleman with a past medical history of bioprosthetic aortic valve replacement, type 2 diabetes mellitus, hypertension, chronic obstructive pulmonary disease, status post right hip arthroplasty who was brought to the emergency department overnight after having a fall outside his church. He states  being in his usual state health, yesterday evening attended a concert at his church. He states at the end of the concert the people in the crowd were in a hurry to get to their cars. He feels that he was rushed and lost his concentration, tripping, concrete landing on his right side. Subsequently he was unable to bear weight. Imaging studies in the emergency room note comminuted minimally displaced right acetabulum fracture that involves a portion of the right superior pubic ramus. He was seen by orthopedic surgery who recommended conservative management.  Objective: Filed Vitals:   04/11/13 1600  BP:   Pulse:   Temp:   Resp: 16    Intake/Output Summary (Last 24 hours) at 04/11/13 1735 Last data filed at 04/11/13 1641  Gross per 24 hour  Intake    240 ml  Output    425 ml  Net   -185 ml   Filed Weights   04/11/13 0427  Weight: 83.1 kg (183 lb 3.2 oz)    Exam:   General:  Patient's reporting right hip pain  Cardiovascular: Irregular rate and rhythm normal S1-S2  Respiratory: Lungs are clear to auscultation bilaterally  Abdomen: Soft nontender nondistended  Musculoskeletal: Patient having pain with passive and active movement to his right lower extremity, I did not note significant hematoma.   Data Reviewed: Basic Metabolic Panel:  Recent Labs Lab 04/11/13 0050  NA 141  K 3.8  CL 101  CO2 28  GLUCOSE 187*  BUN 22  CREATININE 1.29  CALCIUM 9.8   Liver Function Tests:  Recent Labs Lab 04/11/13 0050  AST 25  ALT 28  ALKPHOS 88  BILITOT 0.7  PROT 7.3  ALBUMIN 3.7   No results found for this basename: LIPASE, AMYLASE,  in  the last 168 hours No results found for this basename: AMMONIA,  in the last 168 hours CBC:  Recent Labs Lab 04/11/13 0050  WBC 18.4*  NEUTROABS 16.0*  HGB 14.3  HCT 41.2  MCV 92.8  PLT 87*   Cardiac Enzymes:  Recent Labs Lab 04/11/13 0050  TROPONINI <0.30   BNP (last 3 results)  Recent Labs  07/06/12 1611  07/08/12 0509  PROBNP 1003.0* 1105.0*   CBG: No results found for this basename: GLUCAP,  in the last 168 hours  No results found for this or any previous visit (from the past 240 hour(s)).   Studies: Dg Hip Complete Right  04/11/2013   CLINICAL DATA:  80 year old male with fall and right hip pain. Initial encounter.  EXAM: RIGHT HIP - COMPLETE 2+ VIEW  COMPARISON:  ORIF films right hip 06/27/2011.  FINDINGS: Multiple iliac artery metallic stents in place. Bone mineralization is within normal limits for age. Femoral heads normally located. No acute pelvis fracture identified. Grossly intact visible proximal left femur.  Previous ORIF proximal right femur with intra medullary rod, interlocking dynamic hip screw, and distal interlocking cortical screws. Hardware appears intact. Callus and heterotopic ossification about the proximal right femur. No acute fracture or dislocation identified. Calcified atherosclerosis continuing into the right lower extremity.  IMPRESSION: 1. Posttraumatic and postoperative changes to the proximal right femur. 2. No acute fracture or dislocation identified about the right hip or pelvis. 3. Chronic atherosclerotic/peripheral vascular disease.   Electronically Signed   By: Lars Pinks M.D.   On: 04/11/2013 00:47   Ct Head Wo Contrast  04/11/2013   CLINICAL DATA:  80 year old male status post fall. Initial encounter.  EXAM: CT HEAD WITHOUT CONTRAST  TECHNIQUE: Contiguous axial images were obtained from the base of the skull through the vertex without intravenous contrast.  COMPARISON:  None.  FINDINGS: Widespread paranasal sinus mucosal thickening, mucous retention cysts, and opacification. Mastoids and tympanic cavities are clear. Osteopenia. No scalp hematoma identified. Postoperative changes to the orbits. No acute osseous abnormality identified.  Calcified atherosclerosis at the skull base. Cerebral volume is within normal limits for age. Mild to moderate for age scattered  white matter hypodensity. No midline shift, ventriculomegaly, mass effect, evidence of mass lesion, intracranial hemorrhage or evidence of cortically based acute infarction. Dominant distal right vertebral artery. No suspicious intracranial vascular hyperdensity.  IMPRESSION: No acute intracranial abnormality. No acute traumatic injury identified.   Electronically Signed   By: Lars Pinks M.D.   On: 04/11/2013 00:50   Ct Hip Right Wo Contrast  04/11/2013   CLINICAL DATA:  80 year old male status post fall with right hip pain similar to when he fractured his proximal right femur. Status post ORIF. Initial encounter.  EXAM: CT OF THE RIGHT HIP WITHOUT CONTRAST  TECHNIQUE: Multidetector CT imaging was performed according to the standard protocol. Multiplanar CT image reconstructions were also generated.  COMPARISON:  Right hip radiographs from the same day and earlier.  FINDINGS: Fracture of the roof of the right acetabulum identified (series 3, image 10). See also coronal image 37 on series 5. This fracture is comminuted, with a second fracture plane visible long the anterior floor of the acetabulum (coronal image 25). Furthermore, there is a longitudinal fracture plane extending from the acetabulum into the superior right pubic ramus.  Streak artifact from proximal right femur hardware. No definite acute fracture of the proximal right femur.  Iliac and right lower extremity calcified atherosclerosis. No intramuscular hematoma identified. No pelvic free  fluid identified.  IMPRESSION: Comminuted minimally displaced right acetabulum fractures, involving a portion of the right superior pubic ramus.   Electronically Signed   By: Lars Pinks M.D.   On: 04/11/2013 01:44   Dg Chest Portable 1 View  04/11/2013   CLINICAL DATA:  80 year old male shortness of breath. Hip fracture. Initial encounter.  EXAM: PORTABLE CHEST - 1 VIEW  COMPARISON:  07/06/2012.  FINDINGS: Portable AP supine view at 0442 hrs. Acute on chronic  increased interstitial markings. No pleural effusion, pneumothorax or consolidation. Stable cardiomegaly and mediastinal contours. Visualized tracheal air column is within normal limits. Osteopenia.  IMPRESSION: Pulmonary vascular congestion, no definite overt edema. No other acute cardiopulmonary abnormality.   Electronically Signed   By: Lars Pinks M.D.   On: 04/11/2013 04:57    Scheduled Meds: . benazepril  10 mg Oral Daily  . doxazosin  4 mg Oral Daily  . finasteride  5 mg Oral Daily  . insulin aspart  0-5 Units Subcutaneous QHS  . insulin aspart  0-9 Units Subcutaneous TID WC  . insulin glargine  19 Units Subcutaneous QPM  . loratadine  10 mg Oral Daily  . metoprolol  50 mg Oral BID  . senna  1 tablet Oral BID  . simvastatin  20 mg Oral QPM  . traMADol  50 mg Oral BID  . Warfarin - Pharmacist Dosing Inpatient   Does not apply q1800   Continuous Infusions:   Principal Problem:   Right acetabular fracture Active Problems:   DIABETES MELLITUS, TYPE II   HYPERTENSION   PVD, Rt SFA PTA/HSRA 06/17/11   CAROTID BRUIT- moderate ICA disease 5/14   AS (aortic stenosis)- s/p tissue AVR 01/07/11   Leukocytosis   Persistent atrial fibrillation   COPD (chronic obstructive pulmonary disease)   Chronic diastolic heart failure    Time spent: 35 minutes    Kelvin Cellar  [facility] Pager [phone]. If 7PM-7AM, please contact night-coverage at [email or web address], password [facility] 04/11/2013, 5:35 PM  LOS: 1 day

## 2013-04-11 NOTE — H&P (Signed)
[facility] History and Physical  Patient: Ronald [name]  [id number]  DOB: 09/10/1922  DOS: the patient was seen and examined on 04/11/2013 PCP: Precious Reel, MD  Chief Complaint: Fall  HPI: Ronald [name] is a 80 y.o. male with Past medical history of bioprosthetic aortic valve replacement,, COPD, peripheral vascular disease, diabetes mellitus, neuropathy, BPH, history of a right hip arthroplasty, hypertension. The patient is coming from home. The patient was brought in by his family. He was in a concert and was walking out at which time he missed a step and lost his balance and fell on the ground on the right side and. He denies injuring his head or neck. Of a fall he was in excruciating pain in his right leg and was unable to bear any weight. He denies any loss of consciousness no fever no chills no nausea no vomiting no abdominal pain no cough no diarrhea no dizziness no recent medication change. He denies any tingling numbness or focal neurological deficit. He also hit his right elbow but does not have any pain on movement.   no chest pain or palpitation or vertigo.  Review of Systems: as mentioned in the history of present illness.  A Comprehensive review of the other systems is negative.  Past Medical History  Diagnosis Date  . AS (aortic stenosis)     bovine aortic valve replacement 01/07/11  . HTN (hypertension)   . Thrombocytopenia due to drugs     seen by Dr Inda Merlin plts [id number] no rx  . Peripheral vascular disease very poor circulation legs and feet ... stents right and left legs... done in dr j. Gwenlyn Found 's office.   . Depression wife died 4 years ago.    Marland Kitchen COPD (chronic obstructive pulmonary disease)   . Shortness of breath   . Recurrent upper respiratory infection (URI)     sinusitis  . Claudication in peripheral vascular disease 06/17/2011  . S/P angioplasty with stent, diamond back rotational athrectomy Prox. Rt. SFA 06/17/2011 06/17/2011  . Neuropathy due to  secondary diabetes   . Type II diabetes mellitus   . Pneumonia 07/25/11    left  . Persistent atrial fibrillation   . Blood transfusion     w/hip operation  . History of stomach ulcers ~ 1951  . Renal artery stenosis 2006    renal artery stent  . Bladder cancer     Bladder Cancer local  . Cellulitis of left lower extremity   . Chronic diastolic congestive heart failure   . Normal coronary arteries Sept 2012   Past Surgical History  Procedure Laterality Date  . Peripheral arterial stent graft      2006 left anf right illiac stents Dr Deon Pilling  . Aortic valve replacement  01/07/2011    Procedure: AORTIC VALVE REPLACEMENT (AVR);  Surgeon: Grace Isaac, MD;  Location: [facility];  Service: Open Heart Surgery;  Laterality: N/A;; magna-ease bovine 91mm bioprosthesis  . Cardioversion  04/03/2011    Procedure: CARDIOVERSION;  Surgeon: Leonie Man, MD;  Location: [facility];  Service: Cardiovascular;  Laterality: N/A;  . Lower extremity angiogram  06/17/2011    diamondback orbital rotational and cutting balloon atherectomy of the prox R SFA  . Femur im nail  06/27/2011    Procedure: INTRAMEDULLARY (IM) NAIL FEMORAL;  Surgeon: Marin Shutter, MD;  Location: [facility];  Service: Orthopedics;  Laterality: Right;  . Tonsillectomy and adenoidectomy      "when I was a kid"  .  Cholecystectomy    . Cataract extraction w/ intraocular lens  implant, bilateral  ~ 2007  . Renal artery stent  2006    "I believe"  . Cardiac catheterization  11/19/10    normal coronaries, mod AS, 75% l RAS   Social History:  reports that he has been smoking Cigarettes.  He has a 75 pack-year smoking history. He has never used smokeless tobacco. He reports that he drinks about 1.2 ounces of alcohol per week. He reports that he does not use illicit drugs. Independent for most of his  ADL.  Allergies  Allergen Reactions  . Aspirin Anaphylaxis, Shortness Of Breath and Rash    "broke out in white welts; red blotches neck and face;  windpipe closing; ~ 1962"    Family History  Problem Relation Age of Onset  . Heart disease Mother   . Cancer Brother     colon  . Stroke Father     Prior to Admission medications   Medication Sig Start Date End Date Taking? Authorizing Provider  alendronate (FOSAMAX) 70 MG tablet Take 70 mg by mouth every 14 (fourteen) days. Take with a full glass of water on an empty stomach.   Yes Historical Provider, MD  benazepril (LOTENSIN) 10 MG tablet Take 10 mg by mouth daily.   Yes Historical Provider, MD  doxazosin (CARDURA) 4 MG tablet Take 4 mg by mouth daily.    Yes Coolidge Breeze, PA-C  finasteride (PROSCAR) 5 MG tablet Take 5 mg by mouth daily.     Yes Historical Provider, MD  furosemide (LASIX) 40 MG tablet Take 1 tablet (40 mg total) by mouth 2 (two) times daily. 07/08/12  Yes Tarri Fuller, PA-C  glyBURIDE (DIABETA) 5 MG tablet Take 2.5-10 mg by mouth 2 (two) times daily with a meal. 2 tablets in the morning and 1/2 tablet in the evening   Yes Historical Provider, MD  insulin glargine (LANTUS) 100 UNIT/ML injection Inject 19 Units into the skin every evening.  07/29/11  Yes Precious Reel, MD  loratadine (CLARITIN) 10 MG tablet Take 10 mg by mouth daily.   Yes Historical Provider, MD  metoprolol (LOPRESSOR) 50 MG tablet Take 50 mg by mouth 2 (two) times daily.   Yes Historical Provider, MD  potassium chloride SA (K-DUR,KLOR-CON) 20 MEQ tablet Take 20 mEq by mouth daily.   Yes Historical Provider, MD  PRESCRIPTION MEDICATION Take 1 Inhaler by mouth daily. New inhaler...   Yes Historical Provider, MD  simvastatin (ZOCOR) 20 MG tablet Take 20 mg by mouth every evening.   Yes Historical Provider, MD  traMADol (ULTRAM) 50 MG tablet Take 50 mg by mouth 2 (two) times daily.    Yes Historical Provider, MD  warfarin (COUMADIN) 5 MG tablet Take 2.5 mg by mouth every evening.   Yes Historical Provider, MD    Physical Exam: Filed Vitals:   04/10/13 2228 04/11/13 0242 04/11/13 0244  BP: 214/93 197/78    Pulse: 65    Temp: 97.9 F (36.6 C)  98.5 F (36.9 C)  TempSrc: Oral    Resp: 20 24   SpO2: 94%      General: Alert, Awake and Oriented to Time, Place and Person. Appear in mild distress Eyes: PERRL ENT: Oral Mucosa clear moist. Neck: no JVD Cardiovascular: S1 and S2 Present, no Murmur, Peripheral Pulses Present Respiratory: Bilateral Air entry equal and Decreased, Clear to Auscultation,  no Crackles,no wheezes Abdomen: Bowel Sound Present, Soft and Non tender Skin: no  Rash Extremities: no Pedal edema, no calf tenderness Neurologic: Grossly Unremarkable. Labs on Admission:  CBC:  Recent Labs Lab 04/11/13 0050  WBC 18.4*  NEUTROABS 16.0*  HGB 14.3  HCT 41.2  MCV 92.8  PLT 87*    CMP     Component Value Date/Time   NA 141 04/11/2013 0050   K 3.8 04/11/2013 0050   CL 101 04/11/2013 0050   CO2 28 04/11/2013 0050   GLUCOSE 187* 04/11/2013 0050   BUN 22 04/11/2013 0050   CREATININE 1.29 04/11/2013 0050   CALCIUM 9.8 04/11/2013 0050   PROT 7.3 04/11/2013 0050   ALBUMIN 3.7 04/11/2013 0050   AST 25 04/11/2013 0050   ALT 28 04/11/2013 0050   ALKPHOS 88 04/11/2013 0050   BILITOT 0.7 04/11/2013 0050   GFRNONAA 47* 04/11/2013 0050   GFRAA 55* 04/11/2013 0050    No results found for this basename: LIPASE, AMYLASE,  in the last 168 hours No results found for this basename: AMMONIA,  in the last 168 hours   Recent Labs Lab 04/11/13 0050  TROPONINI <0.30   BNP (last 3 results)  Recent Labs  07/06/12 1611 07/08/12 0509  PROBNP 1003.0* 1105.0*    Radiological Exams on Admission: Dg Hip Complete Right  04/11/2013   CLINICAL DATA:  80 year old male with fall and right hip pain. Initial encounter.  EXAM: RIGHT HIP - COMPLETE 2+ VIEW  COMPARISON:  ORIF films right hip 06/27/2011.  FINDINGS: Multiple iliac artery metallic stents in place. Bone mineralization is within normal limits for age. Femoral heads normally located. No acute pelvis fracture identified. Grossly intact  visible proximal left femur.  Previous ORIF proximal right femur with intra medullary rod, interlocking dynamic hip screw, and distal interlocking cortical screws. Hardware appears intact. Callus and heterotopic ossification about the proximal right femur. No acute fracture or dislocation identified. Calcified atherosclerosis continuing into the right lower extremity.  IMPRESSION: 1. Posttraumatic and postoperative changes to the proximal right femur. 2. No acute fracture or dislocation identified about the right hip or pelvis. 3. Chronic atherosclerotic/peripheral vascular disease.   Electronically Signed   By: Lars Pinks M.D.   On: 04/11/2013 00:47   Ct Head Wo Contrast  04/11/2013   CLINICAL DATA:  80 year old male status post fall. Initial encounter.  EXAM: CT HEAD WITHOUT CONTRAST  TECHNIQUE: Contiguous axial images were obtained from the base of the skull through the vertex without intravenous contrast.  COMPARISON:  None.  FINDINGS: Widespread paranasal sinus mucosal thickening, mucous retention cysts, and opacification. Mastoids and tympanic cavities are clear. Osteopenia. No scalp hematoma identified. Postoperative changes to the orbits. No acute osseous abnormality identified.  Calcified atherosclerosis at the skull base. Cerebral volume is within normal limits for age. Mild to moderate for age scattered white matter hypodensity. No midline shift, ventriculomegaly, mass effect, evidence of mass lesion, intracranial hemorrhage or evidence of cortically based acute infarction. Dominant distal right vertebral artery. No suspicious intracranial vascular hyperdensity.  IMPRESSION: No acute intracranial abnormality. No acute traumatic injury identified.   Electronically Signed   By: Lars Pinks M.D.   On: 04/11/2013 00:50   Ct Hip Right Wo Contrast  04/11/2013   CLINICAL DATA:  80 year old male status post fall with right hip pain similar to when he fractured his proximal right femur. Status post ORIF.  Initial encounter.  EXAM: CT OF THE RIGHT HIP WITHOUT CONTRAST  TECHNIQUE: Multidetector CT imaging was performed according to the standard protocol. Multiplanar CT image reconstructions were also  generated.  COMPARISON:  Right hip radiographs from the same day and earlier.  FINDINGS: Fracture of the roof of the right acetabulum identified (series 3, image 10). See also coronal image 37 on series 5. This fracture is comminuted, with a second fracture plane visible long the anterior floor of the acetabulum (coronal image 25). Furthermore, there is a longitudinal fracture plane extending from the acetabulum into the superior right pubic ramus.  Streak artifact from proximal right femur hardware. No definite acute fracture of the proximal right femur.  Iliac and right lower extremity calcified atherosclerosis. No intramuscular hematoma identified. No pelvic free fluid identified.  IMPRESSION: Comminuted minimally displaced right acetabulum fractures, involving a portion of the right superior pubic ramus.   Electronically Signed   By: Lars Pinks M.D.   On: 04/11/2013 01:44    EKG: Independently reviewed. atrial fibrillation, rate controlled.  Assessment/Plan Principal Problem:   Right acetabular fracture Active Problems:   DIABETES MELLITUS, TYPE II   HYPERTENSION   PVD, Rt SFA PTA/HSRA 06/17/11   CAROTID BRUIT- moderate ICA disease 5/14   AS (aortic stenosis)- s/p tissue AVR 01/07/11   Leukocytosis   Persistent atrial fibrillation   COPD (chronic obstructive pulmonary disease)   Chronic diastolic heart failure   1. Right acetabular fracture The patient is presenting with a mechanical fall. After which she has sustained comminuted right acetabular fracture and right superior pubic ramus fracture. Orthopedic has been considered by ED. At present the patient will be admitted, pain management, bedrest, will be keeping n.p.o. until seen by orthopedics. IV Dilaudid for pain management, or Flexeril when  necessary  2. Diabetes mellitus Placing the patient on sliding scale and continue his home insulin Lantus  3. Accelerated hypertension Continuing Lopressor, benazepril, when necessary IV hydralazine for blood pressure control  4. Peripheral vascular disease coronary artery disease aortic stenosis status post aVR bioprosthetic Monitor on telemetry, continue simvastatin  5. atrial fibrillation rate controlled continue Lopressor. At present not reversing Coumadin until seen by orthopedics.  6. BPH Continue Cardura and Proscar.  7. History of diastolic dysfunction  At present does not appear volume overloaded would hold off Lasix until seen by orthopedics.  Consults: Orthopedics  DVT Prophylaxis: On Coumadin INR therapeutic  Nutrition: N.p.o.  Code Status: Full  Family Communication: Family  was present at bedside, opportunity was given to ask question and all questions were answered satisfactorily at the time of interview. Disposition: Admitted to inpatient in telemetry unit.  Author: Berle Mull, MD [facility] Pager: [phone] 04/11/2013, 3:56 AM    If 7PM-7AM, please contact night-coverage [email or web address] Password [email or web address]

## 2013-04-11 NOTE — Consult Note (Signed)
Reason for Consult: Right hip pain Referring Physician:  EDP  Ronald [name] is an 80 y.o. male.  HPI: [place] stepping down a step last PM. No LOC, syncope    Past Medical History  Diagnosis Date  . AS (aortic stenosis)     bovine aortic valve replacement 01/07/11  . HTN (hypertension)   . Thrombocytopenia due to drugs     seen by Dr Inda Merlin plts [id number] no rx  . Peripheral vascular disease very poor circulation legs and feet ... stents right and left legs... done in dr j. Gwenlyn Found 's office.   . Depression wife died 4 years ago.    Marland Kitchen COPD (chronic obstructive pulmonary disease)   . Shortness of breath   . Recurrent upper respiratory infection (URI)     sinusitis  . Claudication in peripheral vascular disease 06/17/2011  . S/P angioplasty with stent, diamond back rotational athrectomy Prox. Rt. SFA 06/17/2011 06/17/2011  . Neuropathy due to secondary diabetes   . Type II diabetes mellitus   . Pneumonia 07/25/11    left  . Persistent atrial fibrillation   . Blood transfusion     w/hip operation  . History of stomach ulcers ~ 1951  . Renal artery stenosis 2006    renal artery stent  . Bladder cancer     Bladder Cancer local  . Cellulitis of left lower extremity   . Chronic diastolic congestive heart failure   . Normal coronary arteries Sept 2012    Past Surgical History  Procedure Laterality Date  . Peripheral arterial stent graft      2006 left anf right illiac stents Dr Deon Pilling  . Aortic valve replacement  01/07/2011    Procedure: AORTIC VALVE REPLACEMENT (AVR);  Surgeon: Grace Isaac, MD;  Location: [facility];  Service: Open Heart Surgery;  Laterality: N/A;; magna-ease bovine 93mm bioprosthesis  . Cardioversion  04/03/2011    Procedure: CARDIOVERSION;  Surgeon: Leonie Man, MD;  Location: [place];  Service: Cardiovascular;  Laterality: N/A;  . Lower extremity angiogram  06/17/2011    diamondback orbital rotational and cutting balloon atherectomy of the prox R SFA  . Femur im  nail  06/27/2011    Procedure: INTRAMEDULLARY (IM) NAIL FEMORAL;  Surgeon: Marin Shutter, MD;  Location: [facility];  Service: Orthopedics;  Laterality: Right;  . Tonsillectomy and adenoidectomy      "when I was a kid"  . Cholecystectomy    . Cataract extraction w/ intraocular lens  implant, bilateral  ~ 2007  . Renal artery stent  2006    "I believe"  . Cardiac catheterization  11/19/10    normal coronaries, mod AS, 75% l RAS    Family History  Problem Relation Age of Onset  . Heart disease Mother   . Cancer Brother     colon  . Stroke Father     Social History:  reports that he has been smoking Cigarettes.  He has a 75 pack-year smoking history. He has never used smokeless tobacco. He reports that he drinks about 1.2 ounces of alcohol per week. He reports that he does not use illicit drugs.  Allergies:  Allergies  Allergen Reactions  . Aspirin Anaphylaxis, Shortness Of Breath and Rash    "broke out in white welts; red blotches neck and face; windpipe closing; ~ 1962"    Medications: I have reviewed the patient's current medications.  Results for orders placed during the hospital encounter of 04/10/13 (from the past 48 hour(s))  PROTIME-INR     Status: Abnormal   Collection Time    04/11/13 12:50 AM      Result Value Ref Range   Prothrombin Time 22.3 (*) 11.6 - 15.2 seconds   INR 2.03 (*) 0.00 - 1.49  CBC WITH DIFFERENTIAL     Status: Abnormal   Collection Time    04/11/13 12:50 AM      Result Value Ref Range   WBC 18.4 (*) 4.0 - 10.5 K/uL   RBC 4.44  4.22 - 5.81 MIL/uL   Hemoglobin 14.3  13.0 - 17.0 g/dL   HCT 41.2  39.0 - 52.0 %   MCV 92.8  78.0 - 100.0 fL   MCH 32.2  26.0 - 34.0 pg   MCHC 34.7  30.0 - 36.0 g/dL   RDW 14.3  11.5 - 15.5 %   Platelets 87 (*) 150 - 400 K/uL   Comment: REPEATED TO VERIFY     SPECIMEN CHECKED FOR CLOTS     PLATELET COUNT CONFIRMED BY SMEAR   Neutrophils Relative % 87 (*) 43 - 77 %   Lymphocytes Relative 8 (*) 12 - 46 %   Monocytes  Relative 4  3 - 12 %   Eosinophils Relative 1  0 - 5 %   Basophils Relative 0  0 - 1 %   Neutro Abs 16.0 (*) 1.7 - 7.7 K/uL   Lymphs Abs 1.5  0.7 - 4.0 K/uL   Monocytes Absolute 0.7  0.1 - 1.0 K/uL   Eosinophils Absolute 0.2  0.0 - 0.7 K/uL   Basophils Absolute 0.0  0.0 - 0.1 K/uL   Smear Review LARGE PLATELETS PRESENT     Comment: MORPHOLOGY UNREMARKABLE  COMPREHENSIVE METABOLIC PANEL     Status: Abnormal   Collection Time    04/11/13 12:50 AM      Result Value Ref Range   Sodium 141  137 - 147 mEq/L   Potassium 3.8  3.7 - 5.3 mEq/L   Chloride 101  96 - 112 mEq/L   CO2 28  19 - 32 mEq/L   Glucose, Bld 187 (*) 70 - 99 mg/dL   BUN 22  6 - 23 mg/dL   Creatinine, Ser 1.29  0.50 - 1.35 mg/dL   Calcium 9.8  8.4 - 10.5 mg/dL   Total Protein 7.3  6.0 - 8.3 g/dL   Albumin 3.7  3.5 - 5.2 g/dL   AST 25  0 - 37 U/L   ALT 28  0 - 53 U/L   Alkaline Phosphatase 88  39 - 117 U/L   Total Bilirubin 0.7  0.3 - 1.2 mg/dL   GFR calc non Af Amer 47 (*) >90 mL/min   GFR calc Af Amer 55 (*) >90 mL/min   Comment: (NOTE)     The eGFR has been calculated using the CKD EPI equation.     This calculation has not been validated in all clinical situations.     eGFR's persistently <90 mL/min signify possible Chronic Kidney     Disease.  TROPONIN I     Status: None   Collection Time    04/11/13 12:50 AM      Result Value Ref Range   Troponin I <0.30  <0.30 ng/mL   Comment:            Due to the release kinetics of cTnI,     a negative result within the first hours     of the onset of symptoms  does not rule out     myocardial infarction with certainty.     If myocardial infarction is still suspected,     repeat the test at appropriate intervals.  APTT     Status: Abnormal   Collection Time    04/11/13 12:50 AM      Result Value Ref Range   aPTT 43 (*) 24 - 37 seconds   Comment:            IF BASELINE aPTT IS ELEVATED,     SUGGEST PATIENT RISK ASSESSMENT     BE USED TO DETERMINE APPROPRIATE      ANTICOAGULANT THERAPY.  URINALYSIS, ROUTINE W REFLEX MICROSCOPIC     Status: Abnormal   Collection Time    04/11/13  1:35 AM      Result Value Ref Range   Color, Urine YELLOW  YELLOW   APPearance CLEAR  CLEAR   Specific Gravity, Urine 1.018  1.005 - 1.030   pH 6.5  5.0 - 8.0   Glucose, UA 250 (*) NEGATIVE mg/dL   Hgb urine dipstick NEGATIVE  NEGATIVE   Bilirubin Urine NEGATIVE  NEGATIVE   Ketones, ur NEGATIVE  NEGATIVE mg/dL   Protein, ur 100 (*) NEGATIVE mg/dL   Urobilinogen, UA 1.0  0.0 - 1.0 mg/dL   Nitrite NEGATIVE  NEGATIVE   Leukocytes, UA NEGATIVE  NEGATIVE  URINE MICROSCOPIC-ADD ON     Status: None   Collection Time    04/11/13  1:35 AM      Result Value Ref Range   Squamous Epithelial / LPF RARE  RARE   RBC / HPF 3-6  <3 RBC/hpf   Bacteria, UA RARE  RARE  TYPE AND SCREEN     Status: None   Collection Time    04/11/13  5:09 AM      Result Value Ref Range   ABO/RH(D) O POS     Antibody Screen NEG     Sample Expiration 04/14/2013      Dg Hip Complete Right  04/11/2013   CLINICAL DATA:  80 year old male with fall and right hip pain. Initial encounter.  EXAM: RIGHT HIP - COMPLETE 2+ VIEW  COMPARISON:  ORIF films right hip 06/27/2011.  FINDINGS: Multiple iliac artery metallic stents in place. Bone mineralization is within normal limits for age. Femoral heads normally located. No acute pelvis fracture identified. Grossly intact visible proximal left femur.  Previous ORIF proximal right femur with intra medullary rod, interlocking dynamic hip screw, and distal interlocking cortical screws. Hardware appears intact. Callus and heterotopic ossification about the proximal right femur. No acute fracture or dislocation identified. Calcified atherosclerosis continuing into the right lower extremity.  IMPRESSION: 1. Posttraumatic and postoperative changes to the proximal right femur. 2. No acute fracture or dislocation identified about the right hip or pelvis. 3. Chronic  atherosclerotic/peripheral vascular disease.   Electronically Signed   By: Ronald Pinks M.D.   On: 04/11/2013 00:47   Ct Head Wo Contrast  04/11/2013   CLINICAL DATA:  80 year old male status post fall. Initial encounter.  EXAM: CT HEAD WITHOUT CONTRAST  TECHNIQUE: Contiguous axial images were obtained from the base of the skull through the vertex without intravenous contrast.  COMPARISON:  None.  FINDINGS: Widespread paranasal sinus mucosal thickening, mucous retention cysts, and opacification. Mastoids and tympanic cavities are clear. Osteopenia. No scalp hematoma identified. Postoperative changes to the orbits. No acute osseous abnormality identified.  Calcified atherosclerosis at the skull base. Cerebral volume is within normal  limits for age. Mild to moderate for age scattered white matter hypodensity. No midline shift, ventriculomegaly, mass effect, evidence of mass lesion, intracranial hemorrhage or evidence of cortically based acute infarction. Dominant distal right vertebral artery. No suspicious intracranial vascular hyperdensity.  IMPRESSION: No acute intracranial abnormality. No acute traumatic injury identified.   Electronically Signed   By: Ronald Pinks M.D.   On: 04/11/2013 00:50   Ct Hip Right Wo Contrast  04/11/2013   CLINICAL DATA:  80 year old male status post fall with right hip pain similar to when he fractured his proximal right femur. Status post ORIF. Initial encounter.  EXAM: CT OF THE RIGHT HIP WITHOUT CONTRAST  TECHNIQUE: Multidetector CT imaging was performed according to the standard protocol. Multiplanar CT image reconstructions were also generated.  COMPARISON:  Right hip radiographs from the same day and earlier.  FINDINGS: Fracture of the roof of the right acetabulum identified (series 3, image 10). See also coronal image 37 on series 5. This fracture is comminuted, with a second fracture plane visible long the anterior floor of the acetabulum (coronal image 25). Furthermore, there  is a longitudinal fracture plane extending from the acetabulum into the superior right pubic ramus.  Streak artifact from proximal right femur hardware. No definite acute fracture of the proximal right femur.  Iliac and right lower extremity calcified atherosclerosis. No intramuscular hematoma identified. No pelvic free fluid identified.  IMPRESSION: Comminuted minimally displaced right acetabulum fractures, involving a portion of the right superior pubic ramus.   Electronically Signed   By: Ronald Pinks M.D.   On: 04/11/2013 01:44   Dg Chest Portable 1 View  04/11/2013   CLINICAL DATA:  80 year old male shortness of breath. Hip fracture. Initial encounter.  EXAM: PORTABLE CHEST - 1 VIEW  COMPARISON:  07/06/2012.  FINDINGS: Portable AP supine view at 0442 hrs. Acute on chronic increased interstitial markings. No pleural effusion, pneumothorax or consolidation. Stable cardiomegaly and mediastinal contours. Visualized tracheal air column is within normal limits. Osteopenia.  IMPRESSION: Pulmonary vascular congestion, no definite overt edema. No other acute cardiopulmonary abnormality.   Electronically Signed   By: Ronald Pinks M.D.   On: 04/11/2013 04:57    Review of Systems  Musculoskeletal: Positive for joint pain.  Neurological: Positive for sensory change.  All other systems reviewed and are negative.   Blood pressure 159/76, pulse 101, temperature 99 F (37.2 C), temperature source Oral, resp. rate 18, height 6' (1.829 m), weight 83.1 kg (183 lb 3.2 oz), SpO2 96.00%. Physical Exam  Constitutional: He is oriented to person, place, and time. He appears well-developed.  HENT:  Head: Normocephalic.  Eyes: Pupils are equal, round, and reactive to light.  Neck: Normal range of motion.  Cardiovascular: Regular rhythm.   Respiratory: Effort normal. No respiratory distress. He has no wheezes.  GI: Soft. There is no tenderness.  Musculoskeletal: He exhibits tenderness.  Pain with ROM right hip. No  DVT. NVI  Neurological: He is alert and oriented to person, place, and time.  Skin: Skin is warm and dry.  Psychiatric: He has a normal mood and affect.  Trace PT left. Foot warm with good capillary refill.  Assessment/Plan: Closed minimally displaced fracture right acetabulum. Multiple CoMorbidities Plan conservative treatment. Nonweightbearing. Bed to chair transfers when tolerated. On coumadin. Analgesics. Will require SNF 6-8 wks until WB  Marissia Blackham C 04/11/2013, 7:24 AM

## 2013-04-11 NOTE — ED Provider Notes (Signed)
CSN: [id number]     Arrival date & time 04/10/13  2228 History   First MD Initiated Contact with Patient 04/10/13 2309     Chief Complaint  Patient presents with  . Fall     (Consider location/radiation/quality/duration/timing/severity/associated sxs/prior Treatment) HPI Patient presents with a fall from standing onto his right hip earlier this evening. He states that he did not see a step and lost his balance. He denies hitting his head or neck. He's been unable to weight-bear on the right leg since the fall. He states feels very similar to when he broke this same hip in 2013. At that time Dr. Onnie Graham from [facility] orthopedics repair of the hip. Patient denies any loss of consciousness. He complains of pain in the right hip and right groin area. The pain is worse with any movement. He has no numbness in the leg. He has no focal weakness. Patient did strike his elbow but pain is improved. There is no swelling he has full range of motion of the right elbow. Patient denies any preceding dizziness, chest pain, nausea, vision changes or any other symptoms. Past Medical History  Diagnosis Date  . AS (aortic stenosis)     bovine aortic valve replacement 01/07/11  . HTN (hypertension)   . Thrombocytopenia due to drugs     seen by Dr Inda Merlin plts 114000 no rx  . Peripheral vascular disease very poor circulation legs and feet ... stents right and left legs... done in dr j. Gwenlyn Found 's office.   . Depression wife died 4 years ago.    Marland Kitchen COPD (chronic obstructive pulmonary disease)   . Shortness of breath   . Recurrent upper respiratory infection (URI)     sinusitis  . Claudication in peripheral vascular disease 06/17/2011  . S/P angioplasty with stent, diamond back rotational athrectomy Prox. Rt. SFA 06/17/2011 06/17/2011  . Neuropathy due to secondary diabetes   . Type II diabetes mellitus   . Pneumonia 07/25/11    left  . Persistent atrial fibrillation   . Blood transfusion     w/hip operation  .  History of stomach ulcers ~ 1951  . Renal artery stenosis 2006    renal artery stent  . Bladder cancer     Bladder Cancer local  . Cellulitis of left lower extremity   . Chronic diastolic congestive heart failure   . Normal coronary arteries Sept 2012   Past Surgical History  Procedure Laterality Date  . Peripheral arterial stent graft      2006 left anf right illiac stents Dr Deon Pilling  . Aortic valve replacement  01/07/2011    Procedure: AORTIC VALVE REPLACEMENT (AVR);  Surgeon: Grace Isaac, MD;  Location: [place];  Service: Open Heart Surgery;  Laterality: N/A;; magna-ease bovine 27mm bioprosthesis  . Cardioversion  04/03/2011    Procedure: CARDIOVERSION;  Surgeon: Leonie Man, MD;  Location: [place] [facility];  Service: Cardiovascular;  Laterality: N/A;  . Lower extremity angiogram  06/17/2011    diamondback orbital rotational and cutting balloon atherectomy of the prox R SFA  . Femur im nail  06/27/2011    Procedure: INTRAMEDULLARY (IM) NAIL FEMORAL;  Surgeon: Marin Shutter, MD;  Location: [facility];  Service: Orthopedics;  Laterality: Right;  . Tonsillectomy and adenoidectomy      "when I was a kid"  . Cholecystectomy    . Cataract extraction w/ intraocular lens  implant, bilateral  ~ 2007  . Renal artery stent  2006    "  I believe"  . Cardiac catheterization  11/19/10    normal coronaries, mod AS, 75% l RAS   Family History  Problem Relation Age of Onset  . Heart disease Mother   . Cancer Brother     colon  . Stroke Father    History  Substance Use Topics  . Smoking status: Current Every Day Smoker -- 1.00 packs/day for 75 years    Types: Cigarettes  . Smokeless tobacco: Never Used  . Alcohol Use: 1.2 oz/week    2 Cans of beer per week    Review of Systems  Constitutional: Negative for fever, chills and fatigue.  Eyes: Negative for visual disturbance.  Respiratory: Negative for cough and shortness of breath.   Gastrointestinal: Negative for nausea, vomiting, abdominal pain  and diarrhea.  Genitourinary: Negative for dysuria and frequency.  Musculoskeletal: Positive for arthralgias. Negative for back pain, myalgias, neck pain and neck stiffness.  Skin: Positive for wound. Negative for rash.  Neurological: Negative for dizziness, syncope, light-headedness, numbness and headaches.  All other systems reviewed and are negative.      Allergies  Aspirin  Home Medications   Current Outpatient Rx  Name  Route  Sig  Dispense  Refill  . alendronate (FOSAMAX) 70 MG tablet   Oral   Take 70 mg by mouth every 7 (seven) days. Take with a full glass of water on an empty stomach.         Marland Kitchen amoxicillin-clavulanate (AUGMENTIN) 875-125 MG per tablet   Oral   Take 1 tablet by mouth 2 (two) times daily.   20 tablet   0   . benazepril (LOTENSIN) 10 MG tablet   Oral   Take 10 mg by mouth daily.         . clopidogrel (PLAVIX) 75 MG tablet   Oral   Take 75 mg by mouth daily.         Marland Kitchen diltiazem (DILACOR XR) 240 MG 24 hr capsule   Oral   Take 240 mg by mouth daily.         Marland Kitchen doxazosin (CARDURA) 4 MG tablet   Oral   Take 4 mg by mouth daily.          . finasteride (PROSCAR) 5 MG tablet   Oral   Take 5 mg by mouth daily.           . Fluticasone-Salmeterol (ADVAIR) 250-50 MCG/DOSE AEPB   Inhalation   Inhale 1 puff into the lungs every 12 (twelve) hours.         . furosemide (LASIX) 40 MG tablet   Oral   Take 1 tablet (40 mg total) by mouth 2 (two) times daily.   60 tablet   5   . glyBURIDE (DIABETA) 5 MG tablet   Oral   Take 2.5-10 mg by mouth 2 (two) times daily with a meal. 2 tablets in the morning and 1/2 tablet in the evening         . insulin glargine (LANTUS) 100 UNIT/ML injection      18 Units every evening.          . loratadine (CLARITIN) 10 MG tablet   Oral   Take 10 mg by mouth daily.         . metoprolol (LOPRESSOR) 50 MG tablet   Oral   Take 50 mg by mouth 2 (two) times daily.         . potassium chloride SA  (K-DUR,KLOR-CON) 20 MEQ  tablet   Oral   Take 20 mEq by mouth daily.         . simvastatin (ZOCOR) 20 MG tablet   Oral   Take 20 mg by mouth every evening.         . tamsulosin (FLOMAX) 0.4 MG CAPS   Oral   Take 0.4 mg by mouth daily after supper.         . traMADol (ULTRAM) 50 MG tablet   Oral   Take 50 mg by mouth as needed for pain.         Marland Kitchen warfarin (COUMADIN) 5 MG tablet      TAKE 1 TABLET DAILY AS DIRECTED.   90 tablet   0    BP 214/93  Pulse 65  Temp(Src) 97.9 F (36.6 C) (Oral)  Resp 20  SpO2 94% Physical Exam  Nursing note and vitals reviewed. Constitutional: He is oriented to person, place, and time. He appears well-developed and well-nourished. No distress.  HENT:  Head: Normocephalic and atraumatic.  Mouth/Throat: Oropharynx is clear and moist. No oropharyngeal exudate.  Eyes: EOM are normal. Pupils are equal, round, and reactive to light.  Neck: Normal range of motion. Neck supple.  No posterior midline cervical tenderness to palpation. Patient has full range of motion of the cervical spine.  Cardiovascular: Normal rate and regular rhythm.   Mechanical heart sounds.  Pulmonary/Chest: Effort normal and breath sounds normal. No respiratory distress. He has no wheezes. He has no rales. He exhibits no tenderness.  Abdominal: Soft. Bowel sounds are normal. He exhibits no distension and no mass. There is no tenderness. There is no rebound and no guarding.  Musculoskeletal: Normal range of motion. He exhibits no edema and no tenderness.  Contusion over the extensor surface of the right elbow. There is no swelling. He has no focal tenderness. Radial pulses distally intact.  Patient's right lower extremity is mildly externally rotated and shortened compared to the left. He has mild tenderness to palpation in the groin and lateral right hip. He has decreased range of motion due to pain.  Neurological: He is alert and oriented to person, place, and time.   Patient with sensation fully intact. He moves all extremities without deficit. Limited range of motion of the right hip due to pain.  Skin: Skin is warm and dry. No rash noted. No erythema.  Psychiatric: He has a normal mood and affect. His behavior is normal.    ED Course  Procedures (including critical care time) Labs Review Labs Reviewed  PROTIME-INR  CBC WITH DIFFERENTIAL  COMPREHENSIVE METABOLIC PANEL  TROPONIN I  URINALYSIS, ROUTINE W REFLEX MICROSCOPIC  APTT   Imaging Review No results found.  EKG Interpretation   None      Date: 04/11/2013  Rate: 80  Rhythm: atrial fibrillation  QRS Axis: normal  Intervals: normal  ST/T Wave abnormalities: nonspecific T wave changes  Conduction Disutrbances:none  Narrative Interpretation:   Old EKG Reviewed: none available    MDM   Final diagnoses:  None   Due to the fact patient on Coumadin we'll get a head CT to rule out any type of intracranial bleed. Patient denies hitting his head, states he did not lose consciousness and I see no evidence for injury. Suspect right hip fracture  Discussed with Dr. Maxie Better. Admit to hospitalist. Patient is to be nonweightbearing on the right hip. Non-operative management. Discussed with Dr. Posey Pronto. Will admit the patient to telemetry bed given his persistent hypertension. Likely  exacerbated by continued pain.  Julianne Rice, MD 04/11/13 [phone]

## 2013-04-12 ENCOUNTER — Inpatient Hospital Stay (HOSPITAL_COMMUNITY): Payer: Medicare Other

## 2013-04-12 DIAGNOSIS — D696 Thrombocytopenia, unspecified: Secondary | ICD-10-CM

## 2013-04-12 DIAGNOSIS — D72829 Elevated white blood cell count, unspecified: Secondary | ICD-10-CM

## 2013-04-12 LAB — PROTIME-INR
INR: 1.71 — AB (ref 0.00–1.49)
PROTHROMBIN TIME: 19.6 s — AB (ref 11.6–15.2)

## 2013-04-12 LAB — CBC
HCT: 36.1 % — ABNORMAL LOW (ref 39.0–52.0)
Hemoglobin: 12.6 g/dL — ABNORMAL LOW (ref 13.0–17.0)
MCH: 31.9 pg (ref 26.0–34.0)
MCHC: 34.9 g/dL (ref 30.0–36.0)
MCV: 91.4 fL (ref 78.0–100.0)
Platelets: 71 10*3/uL — ABNORMAL LOW (ref 150–400)
RBC: 3.95 MIL/uL — AB (ref 4.22–5.81)
RDW: 14.7 % (ref 11.5–15.5)
WBC: 15.4 10*3/uL — AB (ref 4.0–10.5)

## 2013-04-12 LAB — BASIC METABOLIC PANEL
BUN: 25 mg/dL — ABNORMAL HIGH (ref 6–23)
CHLORIDE: 101 meq/L (ref 96–112)
CO2: 25 meq/L (ref 19–32)
Calcium: 8.7 mg/dL (ref 8.4–10.5)
Creatinine, Ser: 1.15 mg/dL (ref 0.50–1.35)
GFR calc Af Amer: 63 mL/min — ABNORMAL LOW (ref >90)
GFR calc non Af Amer: 54 mL/min — ABNORMAL LOW (ref >90)
Glucose, Bld: 160 mg/dL — ABNORMAL HIGH (ref 70–99)
POTASSIUM: 3.8 meq/L (ref 3.7–5.3)
SODIUM: 138 meq/L (ref 137–147)

## 2013-04-12 MED ORDER — AMLODIPINE BESYLATE 5 MG PO TABS
5.0000 mg | ORAL_TABLET | Freq: Every day | ORAL | Status: DC
  Administered 2013-04-12 – 2013-04-15 (×4): 5 mg via ORAL
  Filled 2013-04-12 (×4): qty 1

## 2013-04-12 MED ORDER — WARFARIN SODIUM 4 MG PO TABS
4.0000 mg | ORAL_TABLET | Freq: Once | ORAL | Status: AC
  Administered 2013-04-12: 4 mg via ORAL
  Filled 2013-04-12: qty 1

## 2013-04-12 NOTE — Progress Notes (Signed)
ANTICOAGULATION CONSULT NOTE - Follow Up Consult  Pharmacy Consult for warfarin Indication: atrial fibrillation  Allergies  Allergen Reactions  . Aspirin Anaphylaxis, Shortness Of Breath and Rash    "broke out in white welts; red blotches neck and face; windpipe closing; ~ 1962"   Labs:  Recent Labs  04/11/13 0050 04/12/13 0420  HGB 14.3 12.6*  HCT 41.2 36.1*  PLT 87* 71*  APTT 43*  --   LABPROT 22.3* 19.6*  INR 2.03* 1.71*  CREATININE 1.29 1.15  TROPONINI <0.30  --     Assessment: 89 yoM on warfarin PTA for h/o atrial fibrillation presented 2/15 s/p fall. CT head negative for injuries, patient found with minimally displaced R hip fracture. Ortho on board and recommended conservative/non-operative management. Warfarin was ordered to be continued inpatient. Pt was reportedly receiving warfarin 2.5mg  po daily with last dose on 2/14.   INR today trended down to 1.71, plts trending down to 71K. No bleeding documented. Patient is on a carb modified diet and eating ~40% of meals.    Goal of Therapy:  INR 2-3 Monitor platelets by anticoagulation protocol: Yes   Plan:   Warfarin 4mg  po x 1 tonight as booster dose  Daily PT/INR  Platelets trending down - will monitor and awaits MD decision for threshold to hold wafarin   Vanessa [place], PharmD, BCPS Pager: [phone] 11:21 AM Pharmacy #: [id number]

## 2013-04-12 NOTE — Progress Notes (Addendum)
[facility] PROGRESS NOTE  Ronald [name] BSJ:[id number] DOB: [birth year] DOA: 04/10/2013 PCP: Precious Reel, MD  Assessment/Plan: 1. Closed right acetabular fracture. Patient having a mechanical fall the evening of admission subsequently unable to bear weight. Imaging studies in the emergency department revealing a closed minimally displaced right acetabular fracture. He was seen by orthopedic surgery who recommended conservative management. Consult physical therapy, social work for SNF placement. 2. Leukocytosis. Initial lab work showing a white count of 18,400. Chest x-ray did not show evidence of pneumonia nor did UA show evidence of infection. He has been afebrile throughout the day. Plan to repeat chest x-ray in a.m. meanwhile hold off on antimicrobial therapy for now. It is possible that this may be related to fracture. WBC's trending down to 15,400.   3. Thrombocytopenia. Platelet count decreasing to 71,000 on 04/12/13. Liver enzymes normal. Will check a B12 and folate. He does have a leukocytosis that I suspect could be secondary to pelvic fracture. Underlying infection could also lead to thrombocytopenia although lab work has been unrevealing. Wuill obtain a set of blood cultures.  4. Atrial fibrillation. Rate controlled continue Lopressor 50 mg by mouth twice a day. 5. Anticoagulation for history of atrial fibrillation. Therapeutic at 2.03 with PT of 22.3, pharmacy consult for Coumadin management. 6. HTN. Patient initially presented with elevated blood pressures likely secondary to pain. Continue metoprolol 50 mg by mouth twice a day and benazepril 10 mg by mouth daily. 7. Type 2 diabetes mellitus, insulin-dependent. Continue Lantus 19 units subcutaneous daily with Accu-Cheks q. a.c. and each bedtime.  Code Status: Full Code Disposition Plan: PT and SW consult, patient will likely require SNF placement.    Consultants:  Orthopedic surgery  Physical therapy  Social  work   HPI/Subjective: Patient is a pleasant 80 year old gentleman with a past medical history of bioprosthetic aortic valve replacement, type 2 diabetes mellitus, hypertension, chronic obstructive pulmonary disease, status post right hip arthroplasty who was brought to the emergency department overnight after having a fall outside his church. He states being in his usual state health, yesterday evening attended a concert at his church. He states at the end of the concert the people in the crowd were in a hurry to get to their cars. He feels that he was rushed and lost his concentration, tripping, concrete landing on his right side. Subsequently he was unable to bear weight. Imaging studies in the emergency room note comminuted minimally displaced right acetabulum fracture that involves a portion of the right superior pubic ramus. He was seen by orthopedic surgery who recommended conservative management.  Objective: Filed Vitals:   04/12/13 1402  BP: 118/41  Pulse: 73  Temp: 97.9 F (36.6 C)  Resp: 18    Intake/Output Summary (Last 24 hours) at 04/12/13 1521 Last data filed at 04/12/13 1300  Gross per 24 hour  Intake    480 ml  Output   1025 ml  Net   -545 ml   Filed Weights   04/11/13 0427  Weight: 83.1 kg (183 lb 3.2 oz)    Exam:   General:  Patient's reporting right hip pain  Cardiovascular: Irregular rate and rhythm normal S1-S2  Respiratory: Lungs are clear to auscultation bilaterally  Abdomen: Soft nontender nondistended  Musculoskeletal: Patient having pain with passive and active movement to his right lower extremity, I did not note significant hematoma.   Data Reviewed: Basic Metabolic Panel:  Recent Labs Lab 04/11/13 0050 04/12/13 0420  NA 141 138  K 3.8  3.8  CL 101 101  CO2 28 25  GLUCOSE 187* 160*  BUN 22 25*  CREATININE 1.29 1.15  CALCIUM 9.8 8.7   Liver Function Tests:  Recent Labs Lab 04/11/13 0050  AST 25  ALT 28  ALKPHOS 88  BILITOT  0.7  PROT 7.3  ALBUMIN 3.7   No results found for this basename: LIPASE, AMYLASE,  in the last 168 hours No results found for this basename: AMMONIA,  in the last 168 hours CBC:  Recent Labs Lab 04/11/13 0050 04/12/13 0420  WBC 18.4* 15.4*  NEUTROABS 16.0*  --   HGB 14.3 12.6*  HCT 41.2 36.1*  MCV 92.8 91.4  PLT 87* 71*   Cardiac Enzymes:  Recent Labs Lab 04/11/13 0050  TROPONINI <0.30   BNP (last 3 results)  Recent Labs  07/06/12 1611 07/08/12 0509  PROBNP 1003.0* 1105.0*   CBG: No results found for this basename: GLUCAP,  in the last 168 hours  No results found for this or any previous visit (from the past 240 hour(s)).   Studies: Dg Chest 2 View  04/12/2013   CLINICAL DATA:  Elevated white blood cell count.  EXAM: CHEST  2 VIEW  COMPARISON:  DG CHEST 1V PORT dated 04/11/2013; CT CHEST W/O CM dated 11/11/2012; DG CHEST 1 VIEW dated 07/06/2012  FINDINGS: Mediastinum and hilar structures are normal. Prior median sternotomy and aortic valve replacement. Left atrial clip noted. Cardiomegaly, no pulmonary venous congestion. Mild bibasilar subsegmental atelectasis, no definite pulmonary infiltrate. Small right pleural effusion. No pneumothorax. Diffuse degenerative change and osteopenia thoracic spine.  IMPRESSION: 1. Prior median sternotomy and aortic valve replacement. Left atrial clip noted. 2. Cardiomegaly, no congestive heart failure. 3. Mild bibasilar subsegmental atelectasis. Small right pleural effusion.   Electronically Signed   By: Marcello Moores  Register   On: 04/12/2013 09:26   Dg Hip Complete Right  04/11/2013   CLINICAL DATA:  80 year old male with fall and right hip pain. Initial encounter.  EXAM: RIGHT HIP - COMPLETE 2+ VIEW  COMPARISON:  ORIF films right hip 06/27/2011.  FINDINGS: Multiple iliac artery metallic stents in place. Bone mineralization is within normal limits for age. Femoral heads normally located. No acute pelvis fracture identified. Grossly intact visible  proximal left femur.  Previous ORIF proximal right femur with intra medullary rod, interlocking dynamic hip screw, and distal interlocking cortical screws. Hardware appears intact. Callus and heterotopic ossification about the proximal right femur. No acute fracture or dislocation identified. Calcified atherosclerosis continuing into the right lower extremity.  IMPRESSION: 1. Posttraumatic and postoperative changes to the proximal right femur. 2. No acute fracture or dislocation identified about the right hip or pelvis. 3. Chronic atherosclerotic/peripheral vascular disease.   Electronically Signed   By: Lars Pinks M.D.   On: 04/11/2013 00:47   Ct Head Wo Contrast  04/11/2013   CLINICAL DATA:  80 year old male status post fall. Initial encounter.  EXAM: CT HEAD WITHOUT CONTRAST  TECHNIQUE: Contiguous axial images were obtained from the base of the skull through the vertex without intravenous contrast.  COMPARISON:  None.  FINDINGS: Widespread paranasal sinus mucosal thickening, mucous retention cysts, and opacification. Mastoids and tympanic cavities are clear. Osteopenia. No scalp hematoma identified. Postoperative changes to the orbits. No acute osseous abnormality identified.  Calcified atherosclerosis at the skull base. Cerebral volume is within normal limits for age. Mild to moderate for age scattered white matter hypodensity. No midline shift, ventriculomegaly, mass effect, evidence of mass lesion, intracranial hemorrhage or evidence of  cortically based acute infarction. Dominant distal right vertebral artery. No suspicious intracranial vascular hyperdensity.  IMPRESSION: No acute intracranial abnormality. No acute traumatic injury identified.   Electronically Signed   By: Lars Pinks M.D.   On: 04/11/2013 00:50   Ct Hip Right Wo Contrast  04/11/2013   CLINICAL DATA:  80 year old male status post fall with right hip pain similar to when he fractured his proximal right femur. Status post ORIF. Initial  encounter.  EXAM: CT OF THE RIGHT HIP WITHOUT CONTRAST  TECHNIQUE: Multidetector CT imaging was performed according to the standard protocol. Multiplanar CT image reconstructions were also generated.  COMPARISON:  Right hip radiographs from the same day and earlier.  FINDINGS: Fracture of the roof of the right acetabulum identified (series 3, image 10). See also coronal image 37 on series 5. This fracture is comminuted, with a second fracture plane visible long the anterior floor of the acetabulum (coronal image 25). Furthermore, there is a longitudinal fracture plane extending from the acetabulum into the superior right pubic ramus.  Streak artifact from proximal right femur hardware. No definite acute fracture of the proximal right femur.  Iliac and right lower extremity calcified atherosclerosis. No intramuscular hematoma identified. No pelvic free fluid identified.  IMPRESSION: Comminuted minimally displaced right acetabulum fractures, involving a portion of the right superior pubic ramus.   Electronically Signed   By: Lars Pinks M.D.   On: 04/11/2013 01:44   Dg Chest Portable 1 View  04/11/2013   CLINICAL DATA:  80 year old male shortness of breath. Hip fracture. Initial encounter.  EXAM: PORTABLE CHEST - 1 VIEW  COMPARISON:  07/06/2012.  FINDINGS: Portable AP supine view at 0442 hrs. Acute on chronic increased interstitial markings. No pleural effusion, pneumothorax or consolidation. Stable cardiomegaly and mediastinal contours. Visualized tracheal air column is within normal limits. Osteopenia.  IMPRESSION: Pulmonary vascular congestion, no definite overt edema. No other acute cardiopulmonary abnormality.   Electronically Signed   By: Lars Pinks M.D.   On: 04/11/2013 04:57    Scheduled Meds: . amLODipine  5 mg Oral Daily  . benazepril  10 mg Oral Daily  . doxazosin  4 mg Oral Daily  . finasteride  5 mg Oral Daily  . insulin aspart  0-5 Units Subcutaneous QHS  . insulin aspart  0-9 Units Subcutaneous  TID WC  . insulin glargine  19 Units Subcutaneous QPM  . loratadine  10 mg Oral Daily  . metoprolol  50 mg Oral BID  . senna  1 tablet Oral BID  . simvastatin  20 mg Oral QPM  . traMADol  50 mg Oral BID  . warfarin  4 mg Oral ONCE-1800  . Warfarin - Pharmacist Dosing Inpatient   Does not apply q1800   Continuous Infusions:   Principal Problem:   Right acetabular fracture Active Problems:   DIABETES MELLITUS, TYPE II   HYPERTENSION   PVD, Rt SFA PTA/HSRA 06/17/11   CAROTID BRUIT- moderate ICA disease 5/14   AS (aortic stenosis)- s/p tissue AVR 01/07/11   Leukocytosis   Persistent atrial fibrillation   COPD (chronic obstructive pulmonary disease)   Chronic diastolic heart failure    Time spent: 35 minutes    Kelvin Cellar  [facility] Pager [phone]. If 7PM-7AM, please contact night-coverage at [email or web address], password [facility] 04/12/2013, 3:21 PM  LOS: 2 days

## 2013-04-12 NOTE — Plan of Care (Signed)
Problem: Phase II Progression Outcomes Goal: Bed to chair Outcome: Progressing Needs physical therapy

## 2013-04-12 NOTE — Clinical Social Work Psychosocial (Signed)
     Clinical Social Work Department BRIEF PSYCHOSOCIAL ASSESSMENT 04/12/2013  Patient:  Ronald [name], Ronald [name]     Account Number:  [id number]     Admit date:  04/10/2013  Clinical Social Worker:  Venia Minks  Date/Time:  04/12/2013 12:00 M  Referred by:  Physician  Date Referred:  04/12/2013 Referred for  SNF Placement   Other Referral:   Interview type:  Family Other interview type:    PSYCHOSOCIAL DATA Living Status:  ALONE Admitted from facility:   Level of care:   Primary support name:  Marvell Fuller Primary support relationship to patient:  CHILD, ADULT Degree of support available:   excellent    CURRENT CONCERNS Current Concerns  Post-Acute Placement   Other Concerns:    SOCIAL WORK ASSESSMENT / PLAN Patient was indisposed at time of assessment but defers to his daughter. CSW met with patient's daughter, Ronald [name]. She reports that prior to admission in the hospital, patient lived alone and was still going to work every day! Discussed probable need for snf. Patient is upset about it and is refusing to go to [facility] as that is where his spouse died six years ago. He has previously been to [facility] home and does not wish to go there as he had a bad experience for his blood draws there. Daughter is really hopeful that patient will be able to get into [facility] for rehab.   Assessment/plan status:   Other assessment/ plan:   Information/referral to community resources:    PATIENTS/FAMILYS RESPONSE TO PLAN OF CARE: Daughter is anxious for patient to get into [facility] as she is worried that getting into a place where he had a bad experience will slow his recovery. She is worried that this injury will make it so patients previous independent status will no be something he is capable of anymore. CSW offered emotional support.

## 2013-04-13 DIAGNOSIS — N184 Chronic kidney disease, stage 4 (severe): Secondary | ICD-10-CM

## 2013-04-13 LAB — GLUCOSE, CAPILLARY
GLUCOSE-CAPILLARY: 211 mg/dL — AB (ref 70–99)
GLUCOSE-CAPILLARY: 154 mg/dL — AB (ref 70–99)
GLUCOSE-CAPILLARY: 230 mg/dL — AB (ref 70–99)
GLUCOSE-CAPILLARY: 116 mg/dL — AB (ref 70–99)
Glucose-Capillary: 209 mg/dL — ABNORMAL HIGH (ref 70–99)
Glucose-Capillary: 163 mg/dL — ABNORMAL HIGH (ref 70–99)
Glucose-Capillary: 173 mg/dL — ABNORMAL HIGH (ref 70–99)
Glucose-Capillary: 293 mg/dL — ABNORMAL HIGH (ref 70–99)
Glucose-Capillary: 246 mg/dL — ABNORMAL HIGH (ref 70–99)

## 2013-04-13 LAB — BASIC METABOLIC PANEL
BUN: 24 mg/dL — ABNORMAL HIGH (ref 6–23)
CALCIUM: 8.6 mg/dL (ref 8.4–10.5)
CO2: 25 mEq/L (ref 19–32)
CREATININE: 1.03 mg/dL (ref 0.50–1.35)
Chloride: 100 mEq/L (ref 96–112)
GFR calc non Af Amer: 62 mL/min — ABNORMAL LOW (ref >90)
GFR, EST AFRICAN AMERICAN: 72 mL/min — AB (ref >90)
Glucose, Bld: 95 mg/dL (ref 70–99)
Potassium: 3.6 mEq/L — ABNORMAL LOW (ref 3.7–5.3)
SODIUM: 135 meq/L — AB (ref 137–147)

## 2013-04-13 LAB — PROTIME-INR
INR: 1.72 — ABNORMAL HIGH (ref 0.00–1.49)
Prothrombin Time: 19.7 seconds — ABNORMAL HIGH (ref 11.6–15.2)

## 2013-04-13 LAB — CBC
HCT: 34.4 % — ABNORMAL LOW (ref 39.0–52.0)
Hemoglobin: 11.8 g/dL — ABNORMAL LOW (ref 13.0–17.0)
MCH: 31.3 pg (ref 26.0–34.0)
MCHC: 34.3 g/dL (ref 30.0–36.0)
MCV: 91.2 fL (ref 78.0–100.0)
PLATELETS: 61 10*3/uL — AB (ref 150–400)
RBC: 3.77 MIL/uL — ABNORMAL LOW (ref 4.22–5.81)
RDW: 14.6 % (ref 11.5–15.5)
WBC: 12.9 10*3/uL — AB (ref 4.0–10.5)

## 2013-04-13 LAB — FOLATE: FOLATE: 9.2 ng/mL

## 2013-04-13 LAB — VITAMIN B12: VITAMIN B 12: 323 pg/mL (ref 211–911)

## 2013-04-13 MED ORDER — WARFARIN SODIUM 3 MG PO TABS
3.0000 mg | ORAL_TABLET | Freq: Once | ORAL | Status: AC
  Administered 2013-04-13: 3 mg via ORAL
  Filled 2013-04-13: qty 1

## 2013-04-13 MED ORDER — VANCOMYCIN HCL IN DEXTROSE 750-5 MG/150ML-% IV SOLN
750.0000 mg | Freq: Two times a day (BID) | INTRAVENOUS | Status: DC
  Administered 2013-04-13 – 2013-04-15 (×4): 750 mg via INTRAVENOUS
  Filled 2013-04-13 (×6): qty 150

## 2013-04-13 NOTE — Progress Notes (Signed)
CRITICAL VALUE ALERT  Critical value received:  Gram Positive COCCI in Cluster  Date of notification:  04/13/13  Time of notification:  2040  Critical value read back:yes  Nurse who received alert:  J.Lealand Elting  MD notified (1st page):  K.Schorr  Time of first page:  2047  MD notified (2nd page):  Time of second page:  Responding MD:  K.Schorr  Time MD responded:  2105

## 2013-04-13 NOTE — Progress Notes (Signed)
[facility] PROGRESS NOTE  Ronald [name] UMP:[id number] DOB: [birth year] DOA: 04/10/2013 PCP: Precious Reel, MD  Interim Summary Patient is a pleasant 80 year old gentleman with a past medical history of bioprosthetic aortic valve replacement, type 2 diabetes mellitus, hypertension, chronic obstructive pulmonary disease, status post right hip arthroplasty who was brought to the emergency department overnight after having a fall outside his church. He states being in his usual state health, yesterday evening attended a concert at his church. He states at the end of the concert the people in the crowd were in a hurry to get to their cars. He feels that he was rushed and lost his concentration, tripping, concrete landing on his right side. Subsequently he was unable to bear weight. Imaging studies in the emergency room note comminuted minimally displaced right acetabulum fracture that involves a portion of the right superior pubic ramus. He was seen by orthopedic surgery who recommended conservative management.  Assessment/Plan: 1. Closed right acetabular fracture. Patient having a mechanical fall the evening of admission subsequently unable to bear weight. Imaging studies in the emergency department revealing a closed minimally displaced right acetabular fracture. He was seen by orthopedic surgery who recommended conservative management. Consult physical therapy, social work for SNF placement. 2. Leukocytosis. Initial lab work showing a white count of 18,400. Chest x-ray did not show evidence of pneumonia nor did UA show evidence of infection. He has been afebrile. It is possible that this may be related to fracture. His white count continues to trend down to 12,900 on this mornings labs.   3. Thrombocytopenia. Patient's platelet count further decreasing to 61,000 on this mornings lab work. I went back and reviewed medical records, it appeared he has a history of thrombocytopenia as platelets fell down  into the 50,000 range during a hospitalization in 2012. I discussed case with Dr.Mohammed of hematology who recommended ongoing monitoring, as platelet count will likely improve the next several days.  4. Atrial fibrillation. Rate controlled continue Lopressor 50 mg by mouth twice a day. 5. Anticoagulation for history of atrial fibrillation. INR at 1.72 on this mornings lab work. Pharmacy consulted for warfarin management. 6. HTN. Patient initially presented with elevated blood pressures likely secondary to pain. Continue metoprolol 50 mg by mouth twice a day and benazepril 10 mg by mouth daily. 7. Type 2 diabetes mellitus, insulin-dependent. Continue Lantus 19 units subcutaneous daily with Accu-Cheks q. a.c. and each bedtime. He had an a.m. blood sugar of 116.  Code Status: Full Code Disposition Plan: PT and SW consult, patient will likely require SNF placement.    Consultants:  Orthopedic surgery  Physical therapy  Social work   HPI/Subjective: Patient states doing well, he has no complaints this morning.  Objective: Filed Vitals:   04/13/13 1425  BP: 167/67  Pulse: 72  Temp: 98.9 F (37.2 C)  Resp: 20    Intake/Output Summary (Last 24 hours) at 04/13/13 1750 Last data filed at 04/13/13 1300  Gross per 24 hour  Intake    880 ml  Output    900 ml  Net    -20 ml   Filed Weights   04/11/13 0427  Weight: 83.1 kg (183 lb 3.2 oz)    Exam:   General:  Patient's reporting right hip pain  Cardiovascular: Irregular rate and rhythm normal S1-S2  Respiratory: Lungs are clear to auscultation bilaterally  Abdomen: Soft nontender nondistended  Musculoskeletal: Patient having pain with passive and active movement to his right lower extremity, I did not  note significant hematoma.   Data Reviewed: Basic Metabolic Panel:  Recent Labs Lab 04/11/13 0050 04/12/13 0420 04/13/13 0418  NA 141 138 135*  K 3.8 3.8 3.6*  CL 101 101 100  CO2 28 25 25   GLUCOSE 187* 160* 95   BUN 22 25* 24*  CREATININE 1.29 1.15 1.03  CALCIUM 9.8 8.7 8.6   Liver Function Tests:  Recent Labs Lab 04/11/13 0050  AST 25  ALT 28  ALKPHOS 88  BILITOT 0.7  PROT 7.3  ALBUMIN 3.7   No results found for this basename: LIPASE, AMYLASE,  in the last 168 hours No results found for this basename: AMMONIA,  in the last 168 hours CBC:  Recent Labs Lab 04/11/13 0050 04/12/13 0420 04/13/13 0418  WBC 18.4* 15.4* 12.9*  NEUTROABS 16.0*  --   --   HGB 14.3 12.6* 11.8*  HCT 41.2 36.1* 34.4*  MCV 92.8 91.4 91.2  PLT 87* 71* 61*   Cardiac Enzymes:  Recent Labs Lab 04/11/13 0050  TROPONINI <0.30   BNP (last 3 results)  Recent Labs  07/06/12 1611 07/08/12 0509  PROBNP 1003.0* 1105.0*   CBG:  Recent Labs Lab 04/12/13 0737 04/12/13 1141 04/12/13 1638 04/12/13 2106 04/13/13 0755  GLUCAP 154* 293* 246* 230* 116*    Recent Results (from the past 240 hour(s))  CULTURE, BLOOD (ROUTINE X 2)     Status: None   Collection Time    04/12/13  3:50 PM      Result Value Ref Range Status   Specimen Description BLOOD RIGHT ARM   Final   Special Requests BOTTLES DRAWN AEROBIC AND ANAEROBIC 10CC EACH   Final   Culture  Setup Time     Final   Value: 04/12/2013 21:58     Performed at [facility]   Culture     Final   Value:        BLOOD CULTURE RECEIVED NO GROWTH TO DATE CULTURE WILL BE HELD FOR 5 DAYS BEFORE ISSUING A FINAL NEGATIVE REPORT     Performed at [facility]   Report Status PENDING   Incomplete  CULTURE, BLOOD (ROUTINE X 2)     Status: None   Collection Time    04/12/13  4:00 PM      Result Value Ref Range Status   Specimen Description BLOOD LEFT ARM   Final   Special Requests     Final   Value: BOTTLES DRAWN AEROBIC AND ANAEROBIC 10CC BOTH BOTTLES   Culture  Setup Time     Final   Value: 04/12/2013 21:58     Performed at [facility]   Culture     Final   Value:        BLOOD CULTURE RECEIVED NO GROWTH TO DATE CULTURE WILL BE  HELD FOR 5 DAYS BEFORE ISSUING A FINAL NEGATIVE REPORT     Performed at [facility]   Report Status PENDING   Incomplete     Studies: Dg Chest 2 View  04/12/2013   CLINICAL DATA:  Elevated white blood cell count.  EXAM: CHEST  2 VIEW  COMPARISON:  DG CHEST 1V PORT dated 04/11/2013; CT CHEST W/O CM dated 11/11/2012; DG CHEST 1 VIEW dated 07/06/2012  FINDINGS: Mediastinum and hilar structures are normal. Prior median sternotomy and aortic valve replacement. Left atrial clip noted. Cardiomegaly, no pulmonary venous congestion. Mild bibasilar subsegmental atelectasis, no definite pulmonary infiltrate. Small right pleural effusion. No pneumothorax. Diffuse degenerative change and  osteopenia thoracic spine.  IMPRESSION: 1. Prior median sternotomy and aortic valve replacement. Left atrial clip noted. 2. Cardiomegaly, no congestive heart failure. 3. Mild bibasilar subsegmental atelectasis. Small right pleural effusion.   Electronically Signed   By: Marcello Moores  Register   On: 04/12/2013 09:26    Scheduled Meds: . amLODipine  5 mg Oral Daily  . benazepril  10 mg Oral Daily  . doxazosin  4 mg Oral Daily  . finasteride  5 mg Oral Daily  . insulin aspart  0-5 Units Subcutaneous QHS  . insulin aspart  0-9 Units Subcutaneous TID WC  . insulin glargine  19 Units Subcutaneous QPM  . loratadine  10 mg Oral Daily  . metoprolol  50 mg Oral BID  . senna  1 tablet Oral BID  . simvastatin  20 mg Oral QPM  . traMADol  50 mg Oral BID  . warfarin  3 mg Oral ONCE-1800  . Warfarin - Pharmacist Dosing Inpatient   Does not apply q1800   Continuous Infusions:   Principal Problem:   Right acetabular fracture Active Problems:   DIABETES MELLITUS, TYPE II   HYPERTENSION   PVD, Rt SFA PTA/HSRA 06/17/11   CAROTID BRUIT- moderate ICA disease 5/14   AS (aortic stenosis)- s/p tissue AVR 01/07/11   Leukocytosis   Persistent atrial fibrillation   COPD (chronic obstructive pulmonary disease)   Chronic diastolic heart  failure    Time spent: 35 minutes    Kelvin Cellar  [facility] Pager [phone]. If 7PM-7AM, please contact night-coverage at [email or web address], password [facility] 04/13/2013, 5:50 PM  LOS: 3 days

## 2013-04-13 NOTE — Progress Notes (Signed)
ANTICOAGULATION CONSULT NOTE - Follow Up Consult  Pharmacy Consult for warfarin Indication: atrial fibrillation  Allergies  Allergen Reactions  . Aspirin Anaphylaxis, Shortness Of Breath and Rash    "broke out in white welts; red blotches neck and face; windpipe closing; ~ 1962"   Labs:  Recent Labs  04/11/13 0050 04/12/13 0420 04/13/13 0418  HGB 14.3 12.6* 11.8*  HCT 41.2 36.1* 34.4*  PLT 87* 71* 61*  APTT 43*  --   --   LABPROT 22.3* 19.6* 19.7*  INR 2.03* 1.71* 1.72*  CREATININE 1.29 1.15 1.03  TROPONINI <0.30  --   --     Assessment: 89 yoM on warfarin PTA for h/o atrial fibrillation presented 2/15 s/p fall. CT head negative for injuries, patient found with minimally displaced R hip fracture. Ortho on board and recommended conservative/non-operative management. Warfarin was ordered to be continued inpatient. Pt was reportedly receiving warfarin 2.5mg  po daily with last dose on 2/14.   INR today 1.72, plts trending down to 61K. No bleeding documented. Per communication with primary team, continue warfarin for now despite thrombocytopenia - will follow up tomorrow and likely will hold warfarin when plts nearing 50K.   Goal of Therapy:  INR 2-3 Monitor platelets by anticoagulation protocol: Yes   Plan:   Warfarin 3mg  po x 1 tonight  Daily PT/INR  F/u platelets in AM and decide if warfarin should be continued   Vanessa [place], PharmD, BCPS Pager: [phone] 10:37 AM Pharmacy #: [id number]

## 2013-04-13 NOTE — Evaluation (Signed)
Physical Therapy Evaluation Patient Details Name: Ronald [name] MRN: [id number] DOB: May 22, 1922 Today's Date: 04/13/2013 Time: 2841-3244 PT Time Calculation (min): 24 min  PT Assessment / Plan / Recommendation History of Present Illness  pt fell and has minimally dispalced acetqbular fx on R.  Clinical Impression  Pt tolerated  Transfer from bed to recliner with  RW, able to remain NWB. Pt will benefit from PT to address problems listed. Pt will benefit from SNF.    PT Assessment  Patient needs continued PT services    Follow Up Recommendations  SNF    Does the patient have the potential to tolerate intense rehabilitation      Barriers to Discharge        Equipment Recommendations  None recommended by PT    Recommendations for Other Services     Frequency Min 3X/week    Precautions / Restrictions Precautions Precautions: Fall Restrictions Weight Bearing Restrictions: Yes RLE Weight Bearing: Non weight bearing   Pertinent Vitals/Pain 7 when R hip moves.      Mobility  Bed Mobility Overal bed mobility: Needs Assistance;+2 for physical assistance;+ 2 for safety/equipment Bed Mobility: Supine to Sit Supine to sit: +2 for physical assistance;+2 for safety/equipment;HOB elevated General bed mobility comments: pt required extensive assistance to get to upright, decreased ability to tolerate Hip flexion on R, gradually got upright, weight shifted  to L. Transfers Overall transfer level: Needs assistance Equipment used: Rolling walker (2 wheeled) Transfers: Sit to/from [facility] Sit to Stand: +2 physical assistance;+2 safety/equipment Stand pivot transfers: +2 physical assistance;+2 safety/equipment General transfer comment: pt was able to  scoot on L foot to get over to recliner using RW, frequent cues to not bear weight on R leg,    Exercises     PT Diagnosis: Difficulty walking;Acute pain  PT Problem List: Decreased strength;Decreased range of  motion;Decreased activity tolerance;Decreased mobility;Pain PT Treatment Interventions: DME instruction;Functional mobility training;Therapeutic activities;Patient/family education     PT Goals(Current goals can be found in the care plan section) Acute Rehab PT Goals Patient Stated Goal: I want to go   to [facility]. PT Goal Formulation: With patient Time For Goal Achievement: 04/27/13 Potential to Achieve Goals: Good  Visit Information  Last PT Received On: 04/13/13 Assistance Needed: +2 History of Present Illness: pt fell and has minimally dispalced acetqbular fx on R.       Prior [place] expects to be discharged to:: Skilled nursing facility Living Arrangements: Alone Prior Function Level of Independence: Independent with assistive device(s)    Cognition  Cognition Arousal/Alertness: Awake/alert Behavior During Therapy: WFL for tasks assessed/performed Overall Cognitive Status: Within Functional Limits for tasks assessed    Extremity/Trunk Assessment Upper Extremity Assessment Upper Extremity Assessment: Overall WFL for tasks assessed Lower Extremity Assessment Lower Extremity Assessment: RLE deficits/detail RLE Deficits / Details: requires assist for moving R leg . Tolerated 80* hip flexion.   Balance    End of Session PT - End of Session Activity Tolerance: Patient limited by pain Patient left: in chair;with call bell/phone within reach Nurse Communication: Mobility status  GP     Claretha Cooper 04/13/2013, 1:15 PM Tresa Endo PT [phone]

## 2013-04-13 NOTE — Progress Notes (Signed)
Patient called out for O2, came in pt room to evaluate. Patient was 91-90% on the montior with Room Air. Placed 2L on patient, and he was 96. Blood pressure was a little high @ 161/66.  Will conitutie to motior paitne. He is alert and oriatied. Ronald [name] D

## 2013-04-13 NOTE — Progress Notes (Signed)
ANTIBIOTIC CONSULT NOTE - INITIAL  Pharmacy Consult for Vancomycin  Indication: Bacteremia   Allergies  Allergen Reactions  . Aspirin Anaphylaxis, Shortness Of Breath and Rash    "broke out in white welts; red blotches neck and face; windpipe closing; ~ 1962"    Patient Measurements: Height: 6' (182.9 cm) Weight: 183 lb 3.2 oz (83.1 kg) IBW/kg (Calculated) : 77.6   Vital Signs: Temp: 98.9 F (37.2 C) (02/17 1425) Temp src: Oral (02/17 1425) BP: 167/67 mmHg (02/17 1425) Pulse Rate: 72 (02/17 1425) Intake/Output from previous day: 02/16 0701 - 02/17 0700 In: 720 [P.O.:720] Out: 700 [Urine:700] Intake/Output from this shift:    Labs:  Recent Labs  04/11/13 0050 04/12/13 0420 04/13/13 0418  WBC 18.4* 15.4* 12.9*  HGB 14.3 12.6* 11.8*  PLT 87* 71* 61*  CREATININE 1.29 1.15 1.03   Estimated Creatinine Clearance: 53.4 ml/min (by C-G formula based on Cr of 1.03). No results found for this basename: VANCOTROUGH, VANCOPEAK, VANCORANDOM, GENTTROUGH, GENTPEAK, GENTRANDOM, TOBRATROUGH, TOBRAPEAK, TOBRARND, AMIKACINPEAK, AMIKACINTROU, AMIKACIN,  in the last 72 hours   Microbiology: Recent Results (from the past 720 hour(s))  CULTURE, BLOOD (ROUTINE X 2)     Status: None   Collection Time    04/12/13  3:50 PM      Result Value Ref Range Status   Specimen Description BLOOD RIGHT ARM   Final   Special Requests BOTTLES DRAWN AEROBIC AND ANAEROBIC 10CC EACH   Final   Culture  Setup Time     Final   Value: 04/12/2013 21:58     Performed at [facility]   Culture     Final   Value:        BLOOD CULTURE RECEIVED NO GROWTH TO DATE CULTURE WILL BE HELD FOR 5 DAYS BEFORE ISSUING A FINAL NEGATIVE REPORT     Performed at [facility]   Report Status PENDING   Incomplete  CULTURE, BLOOD (ROUTINE X 2)     Status: None   Collection Time    04/12/13  4:00 PM      Result Value Ref Range Status   Specimen Description BLOOD LEFT ARM   Final   Special Requests     Final    Value: BOTTLES DRAWN AEROBIC AND ANAEROBIC 10CC BOTH BOTTLES   Culture  Setup Time     Final   Value: 04/12/2013 21:58     Performed at [facility]   Culture     Final   Value: GRAM POSITIVE COCCI IN CLUSTERS        BLOOD CULTURE RECEIVED NO GROWTH TO DATE CULTURE WILL BE HELD FOR 5 DAYS BEFORE ISSUING A FINAL NEGATIVE REPORT     17 Note: Gram Stain Report Called to,Read Back By and Verified With: Shirlean Kelly RN 2 24 835P EDMOJ     Performed at [facility]   Report Status PENDING   Incomplete    Medical History: Past Medical History  Diagnosis Date  . AS (aortic stenosis)     bovine aortic valve replacement 01/07/11  . HTN (hypertension)   . Thrombocytopenia due to drugs     seen by Dr Inda Merlin plts 114000 no rx  . Peripheral vascular disease very poor circulation legs and feet ... stents right and left legs... done in dr j. Gwenlyn Found 's office.   . Depression wife died 4 years ago.    Marland Kitchen COPD (chronic obstructive pulmonary disease)   . Shortness of breath   .  Recurrent upper respiratory infection (URI)     sinusitis  . Claudication in peripheral vascular disease 06/17/2011  . S/P angioplasty with stent, diamond back rotational athrectomy Prox. Rt. SFA 06/17/2011 06/17/2011  . Neuropathy due to secondary diabetes   . Type II diabetes mellitus   . Pneumonia 07/25/11    left  . Persistent atrial fibrillation   . Blood transfusion     w/hip operation  . History of stomach ulcers ~ 1951  . Renal artery stenosis 2006    renal artery stent  . Bladder cancer     Bladder Cancer local  . Cellulitis of left lower extremity   . Chronic diastolic congestive heart failure   . Normal coronary arteries Sept 2012    Medications:  Scheduled:  . amLODipine  5 mg Oral Daily  . benazepril  10 mg Oral Daily  . doxazosin  4 mg Oral Daily  . finasteride  5 mg Oral Daily  . insulin aspart  0-5 Units Subcutaneous QHS  . insulin aspart  0-9 Units Subcutaneous TID WC  .  insulin glargine  19 Units Subcutaneous QPM  . loratadine  10 mg Oral Daily  . metoprolol  50 mg Oral BID  . senna  1 tablet Oral BID  . simvastatin  20 mg Oral QPM  . traMADol  50 mg Oral BID  . vancomycin  750 mg Intravenous Q12H  . Warfarin - Pharmacist Dosing Inpatient   Does not apply q1800   Infusions:   PRN: cyclobenzaprine, hydrALAZINE, HYDROcodone-acetaminophen, HYDROmorphone (DILAUDID) injection Assessment:  80 yo M with 1/2 blood cultures from 2/16 with GPC in clusters.  Pt to start vancomycin per pharmacy  Scr 1.03 with CrCl 53 ml/min   Weight 83 kg    Goal of Therapy:  Vancomycin trough level 15-20 mcg/ml  Plan:  1.) Vancomycin 750 mg IV q12h 2.) Monitor renal function 3.) f/u cultures   Taline Nass, Gaye Alken PharmD Pager #: [phone] 9:21 PM 04/13/2013

## 2013-04-14 ENCOUNTER — Inpatient Hospital Stay (HOSPITAL_COMMUNITY): Payer: Medicare Other

## 2013-04-14 DIAGNOSIS — M129 Arthropathy, unspecified: Secondary | ICD-10-CM

## 2013-04-14 LAB — CBC
HCT: 32.2 % — ABNORMAL LOW (ref 39.0–52.0)
Hemoglobin: 11.1 g/dL — ABNORMAL LOW (ref 13.0–17.0)
MCH: 31.4 pg (ref 26.0–34.0)
MCHC: 34.5 g/dL (ref 30.0–36.0)
MCV: 91.2 fL (ref 78.0–100.0)
PLATELETS: 68 10*3/uL — AB (ref 150–400)
RBC: 3.53 MIL/uL — ABNORMAL LOW (ref 4.22–5.81)
RDW: 14.7 % (ref 11.5–15.5)
WBC: 8.6 10*3/uL (ref 4.0–10.5)

## 2013-04-14 LAB — PROTIME-INR
INR: 1.63 — AB (ref 0.00–1.49)
Prothrombin Time: 18.9 seconds — ABNORMAL HIGH (ref 11.6–15.2)

## 2013-04-14 MED ORDER — WARFARIN SODIUM 5 MG PO TABS
5.0000 mg | ORAL_TABLET | Freq: Once | ORAL | Status: AC
  Administered 2013-04-14: 5 mg via ORAL
  Filled 2013-04-14: qty 1

## 2013-04-14 NOTE — Progress Notes (Signed)
[facility] PROGRESS NOTE  Ronald [name] [id number] DOB: 03/20/1922 DOA: 04/10/2013 PCP: Precious Reel, MD  Interim Summary Patient is a pleasant 80 year old gentleman with a past medical history of bioprosthetic aortic valve replacement, type 2 diabetes mellitus, hypertension, chronic obstructive pulmonary disease, status post right hip arthroplasty who was brought to the emergency department overnight after having a fall outside his church. He states being in his usual state health, yesterday evening attended a concert at his church. He states at the end of the concert the people in the crowd were in a hurry to get to their cars. He feels that he was rushed and lost his concentration, tripping, concrete landing on his right side. Subsequently he was unable to bear weight. Imaging studies in the emergency room note comminuted minimally displaced right acetabulum fracture that involves a portion of the right superior pubic ramus. He was seen by orthopedic surgery who recommended conservative management.  Assessment/Plan: 1. Closed right acetabular fracture. Patient having a mechanical fall the evening of admission subsequently unable to bear weight. Imaging studies in the emergency department revealing a closed minimally displaced right acetabular fracture. He was seen by orthopedic surgery who recommended conservative management. Planning SNF placement. 2. Leukocytosis. Initial lab work showing a white count of 18,400. Chest x-ray did not show evidence of pneumonia nor did UA show evidence of infection. He has been afebrile. It is possible that this may be related to fracture. His white count has trended to normal. Blood cx pos for 1/2 gm pos organisms. ?contaminant? 3. Thrombocytopenia. Patient's currently 68,000. I went back and reviewed medical records, it appeared he has a history of thrombocytopenia as platelets fell down into the 50,000 range during a hospitalization in 2012. The case was  ddiscussed case with Dr.Mohammed of hematology who recommended ongoing monitoring, as platelet count will likely improve the next several days.  4. Atrial fibrillation. Rate controlled continue Lopressor 50 mg by mouth twice a day. 5. Anticoagulation for history of atrial fibrillation. INR at 1.63 on this mornings lab work. Pharmacy consulted for warfarin management. 6. HTN. Patient initially presented with elevated blood pressures likely secondary to pain. Continue metoprolol 50 mg by mouth twice a day and benazepril 10 mg by mouth daily. 7. Type 2 diabetes mellitus, insulin-dependent. Continue Lantus 19 units subcutaneous daily with Accu-Cheks q. a.c. and each bedtime. He had an a.m. blood sugar of 116.  Code Status: Full Code Disposition Plan: PT and SW consult, patient will likely require SNF placement.    Consultants:  Orthopedic surgery  Physical therapy  Social work   HPI/Subjective: No acute events noted.  Objective: Filed Vitals:   04/14/13 0300  BP: 164/72  Pulse: 83  Temp: 98.7 F (37.1 C)  Resp: 18    Intake/Output Summary (Last 24 hours) at 04/14/13 1239 Last data filed at 04/14/13 1104  Gross per 24 hour  Intake   1070 ml  Output   1000 ml  Net     70 ml   Filed Weights   04/11/13 0427  Weight: 83.1 kg (183 lb 3.2 oz)    Exam:   General:  Patient's reporting right hip pain  Cardiovascular: Irregular rate and rhythm normal S1-S2  Respiratory: Lungs are clear to auscultation bilaterally  Abdomen: Soft nontender nondistended  Musculoskeletal: Patient having pain with passive and active movement to his right lower extremity, I did not note significant hematoma.   Data Reviewed: Basic Metabolic Panel:  Recent Labs Lab 04/11/13 0050 04/12/13 0420  04/13/13 0418  NA 141 138 135*  K 3.8 3.8 3.6*  CL 101 101 100  CO2 28 25 25   GLUCOSE 187* 160* 95  BUN 22 25* 24*  CREATININE 1.29 1.15 1.03  CALCIUM 9.8 8.7 8.6   Liver Function  Tests:  Recent Labs Lab 04/11/13 0050  AST 25  ALT 28  ALKPHOS 88  BILITOT 0.7  PROT 7.3  ALBUMIN 3.7   No results found for this basename: LIPASE, AMYLASE,  in the last 168 hours No results found for this basename: AMMONIA,  in the last 168 hours CBC:  Recent Labs Lab 04/11/13 0050 04/12/13 0420 04/13/13 0418 04/14/13 0438  WBC 18.4* 15.4* 12.9* 8.6  NEUTROABS 16.0*  --   --   --   HGB 14.3 12.6* 11.8* 11.1*  HCT 41.2 36.1* 34.4* 32.2*  MCV 92.8 91.4 91.2 91.2  PLT 87* 71* 61* 68*   Cardiac Enzymes:  Recent Labs Lab 04/11/13 0050  TROPONINI <0.30   BNP (last 3 results)  Recent Labs  07/06/12 1611 07/08/12 0509  PROBNP 1003.0* 1105.0*   CBG:  Recent Labs Lab 04/12/13 0737 04/12/13 1141 04/12/13 1638 04/12/13 2106 04/13/13 0755  GLUCAP 154* 293* 246* 230* 116*    Recent Results (from the past 240 hour(s))  CULTURE, BLOOD (ROUTINE X 2)     Status: None   Collection Time    04/12/13  3:50 PM      Result Value Ref Range Status   Specimen Description BLOOD RIGHT ARM   Final   Special Requests BOTTLES DRAWN AEROBIC AND ANAEROBIC 10CC EACH   Final   Culture  Setup Time     Final   Value: 04/12/2013 21:58     Performed at [facility]   Culture     Final   Value:        BLOOD CULTURE RECEIVED NO GROWTH TO DATE CULTURE WILL BE HELD FOR 5 DAYS BEFORE ISSUING A FINAL NEGATIVE REPORT     Performed at [facility]   Report Status PENDING   Incomplete  CULTURE, BLOOD (ROUTINE X 2)     Status: None   Collection Time    04/12/13  4:00 PM      Result Value Ref Range Status   Specimen Description BLOOD LEFT ARM   Final   Special Requests     Final   Value: BOTTLES DRAWN AEROBIC AND ANAEROBIC 10CC BOTH BOTTLES   Culture  Setup Time     Final   Value: 04/12/2013 21:58     Performed at [facility]   Culture     Final   Value: GRAM POSITIVE COCCI IN CLUSTERS        BLOOD CULTURE RECEIVED NO GROWTH TO DATE CULTURE WILL BE HELD FOR 5  DAYS BEFORE ISSUING A FINAL NEGATIVE REPORT     17 Note: Gram Stain Report Called to,Read Back By and Verified With: Shirlean Kelly RN 2 15 75P EDMOJ     Performed at [facility]   Report Status PENDING   Incomplete     Studies: Dg Hip Complete Right  04/14/2013   CLINICAL DATA:  Acetabular fracture  EXAM: RIGHT HIP - COMPLETE 2+ VIEW  COMPARISON:  CT HIP*R* W/O CM dated 04/11/2013  FINDINGS: Right acetabular fracture is occult on plain radiography. Dynamic compression screw and intra medullary rod within the right proximal femur is present with callus formation. No definite acute fracture or dislocation. No breakage  or loosening of the hardware. All bony structures remain anatomically aligned.  IMPRESSION: Stable anatomic alignment of the bony framework and right acetabulum fracture.   Electronically Signed   By: Maryclare Bean M.D.   On: 04/14/2013 11:07    Scheduled Meds: . amLODipine  5 mg Oral Daily  . benazepril  10 mg Oral Daily  . doxazosin  4 mg Oral Daily  . finasteride  5 mg Oral Daily  . insulin aspart  0-5 Units Subcutaneous QHS  . insulin aspart  0-9 Units Subcutaneous TID WC  . insulin glargine  19 Units Subcutaneous QPM  . loratadine  10 mg Oral Daily  . metoprolol  50 mg Oral BID  . senna  1 tablet Oral BID  . simvastatin  20 mg Oral QPM  . traMADol  50 mg Oral BID  . vancomycin  750 mg Intravenous Q12H  . warfarin  5 mg Oral ONCE-1800  . Warfarin - Pharmacist Dosing Inpatient   Does not apply q1800   Continuous Infusions:   Principal Problem:   Right acetabular fracture Active Problems:   DIABETES MELLITUS, TYPE II   HYPERTENSION   PVD, Rt SFA PTA/HSRA 06/17/11   CAROTID BRUIT- moderate ICA disease 5/14   AS (aortic stenosis)- s/p tissue AVR 01/07/11   Leukocytosis   Persistent atrial fibrillation   COPD (chronic obstructive pulmonary disease)   Chronic diastolic heart failure  Time spent: 35 minutes  [facility] Pager  [phone]. If 7PM-7AM, please contact night-coverage at [email or web address], password [facility] 04/14/2013, 12:39 PM  LOS: 4 days

## 2013-04-14 NOTE — Progress Notes (Signed)
ANTICOAGULATION CONSULT NOTE - Follow Up Consult  Pharmacy Consult for warfarin Indication: atrial fibrillation  Allergies  Allergen Reactions  . Aspirin Anaphylaxis, Shortness Of Breath and Rash    "broke out in white welts; red blotches neck and face; windpipe closing; ~ 1962"   Labs:  Recent Labs  04/12/13 0420 04/13/13 0418 04/14/13 0438  HGB 12.6* 11.8* 11.1*  HCT 36.1* 34.4* 32.2*  PLT 71* 61* 68*  LABPROT 19.6* 19.7* 18.9*  INR 1.71* 1.72* 1.63*  CREATININE 1.15 1.03  --     Assessment: Ronald [name] on warfarin PTA for h/o atrial fibrillation presented 2/15 s/p fall. CT head negative for injuries, patient found with minimally displaced R hip fracture. Ortho on board and recommended conservative/non-operative management. Warfarin was ordered to be continued inpatient. Pt was reportedly receiving warfarin 2.5mg  po daily with last dose on 2/14.   INR today 1.63, plts slightly improved to 68K. No bleeding documented. Per communication with primary team, continue warfarin for now despite thrombocytopenia - will follow up tomorrow and likely will hold warfarin if plts nearing 50K.   Goal of Therapy:  INR 2-3 Monitor platelets by anticoagulation protocol: Yes   Plan:   Increase Warfarin tonight to 5mg  po x 1   Not sure why INR trending down (given higher dose vs home the last 2 days)  Daily PT/INR  Platelets improved this am  Doreene Eland, PharmD, BCPS.   Pager: [phone] 04/14/2013 7:02 AM

## 2013-04-14 NOTE — Care Management Note (Addendum)
    Page 1 of 1   04/15/2013     5:33:10 PM   CARE MANAGEMENT NOTE 04/15/2013  Patient:  Ronald [name], Ronald [name]   Account Number:  [id number]  Date Initiated:  04/14/2013  Documentation initiated by:  [facility]  Subjective/Objective Assessment:   80 Y/O M ADMITTED W/R HIP FX.WI:[id number].     Action/Plan:   FROM HOME.   Anticipated DC Date:  04/15/2013   Anticipated DC Plan:  [place]  CM consult      Choice offered to / List presented to:             Status of service:  Completed, signed off Medicare Important Message given?   (If response is "NO", the following Medicare IM given date fields will be blank) Date Medicare IM given:   Date Additional Medicare IM given:    Discharge Disposition:  [place]  Per UR Regulation:  Reviewed for med. necessity/level of care/duration of stay  If discussed at [place] of Stay Meetings, dates discussed:   04/15/2013    Comments:  04/14/13 Teja Judice RN,BSN NCM [phone] D/C SNF IN AM.

## 2013-04-14 NOTE — Progress Notes (Addendum)
Subjective: Minimal pain Tolerating transfers   Objective: Vital signs in last 24 hours: Temp:  [98.7 F (37.1 C)-99.1 F (37.3 C)] 98.7 F (37.1 C) (02/18 0300) Pulse Rate:  [72-83] 83 (02/18 0300) Resp:  [18-20] 18 (02/18 0300) BP: (134-167)/(57-72) 164/72 mmHg (02/18 0300) SpO2:  [97 %-98 %] 98 % (02/18 0300)  Intake/Output from previous day: 02/17 0701 - 02/18 0700 In: 1030 [P.O.:880; IV Piggyback:150] Out: 800 [Urine:800] Intake/Output this shift:     Recent Labs  04/12/13 0420 04/13/13 0418 04/14/13 0438  HGB 12.6* 11.8* 11.1*    Recent Labs  04/13/13 0418 04/14/13 0438  WBC 12.9* 8.6  RBC 3.77* 3.53*  HCT 34.4* 32.2*  PLT 61* 68*    Recent Labs  04/12/13 0420 04/13/13 0418  NA 138 135*  K 3.8 3.6*  CL 101 100  CO2 25 25  BUN 25* 24*  CREATININE 1.15 1.03  GLUCOSE 160* 95  CALCIUM 8.7 8.6    Recent Labs  04/13/13 0418 04/14/13 0438  INR 1.72* 1.63*    ABD soft Intact pulses distally Dorsiflexion/Plantar flexion intact Compartment soft  Assessment/Plan: Doing well NWB right leg. Will get xray for F/U for assure no change Next xray in 2-3 weks   BEANE,JEFFREY C 04/14/2013, 9:59 AM     xrays R hip reviewed by Dr. Tonita Cong, fx stable, no change Remain NWB RLE as above, follow up 2-3 weeks for repeat xrays as outpt Likely SNF upon D/C Lacie Draft PA-C for Dr. Tonita Cong

## 2013-04-14 NOTE — Progress Notes (Signed)
CSW provided bed offers to daughter at bedside. She is thrilled that there is a bed available at [facility]. Will follow for discharge.  Branna Cortina C. Clayton MSW, [place]

## 2013-04-15 DIAGNOSIS — J449 Chronic obstructive pulmonary disease, unspecified: Secondary | ICD-10-CM | POA: Diagnosis not present

## 2013-04-15 DIAGNOSIS — I739 Peripheral vascular disease, unspecified: Secondary | ICD-10-CM | POA: Diagnosis not present

## 2013-04-15 DIAGNOSIS — R262 Difficulty in walking, not elsewhere classified: Secondary | ICD-10-CM | POA: Diagnosis not present

## 2013-04-15 DIAGNOSIS — I5032 Chronic diastolic (congestive) heart failure: Secondary | ICD-10-CM | POA: Diagnosis not present

## 2013-04-15 DIAGNOSIS — Z8551 Personal history of malignant neoplasm of bladder: Secondary | ICD-10-CM | POA: Diagnosis not present

## 2013-04-15 DIAGNOSIS — D72829 Elevated white blood cell count, unspecified: Secondary | ICD-10-CM | POA: Diagnosis not present

## 2013-04-15 DIAGNOSIS — E119 Type 2 diabetes mellitus without complications: Secondary | ICD-10-CM | POA: Diagnosis not present

## 2013-04-15 DIAGNOSIS — D696 Thrombocytopenia, unspecified: Secondary | ICD-10-CM | POA: Diagnosis not present

## 2013-04-15 DIAGNOSIS — Z7901 Long term (current) use of anticoagulants: Secondary | ICD-10-CM | POA: Diagnosis not present

## 2013-04-15 DIAGNOSIS — N400 Enlarged prostate without lower urinary tract symptoms: Secondary | ICD-10-CM | POA: Diagnosis not present

## 2013-04-15 DIAGNOSIS — R279 Unspecified lack of coordination: Secondary | ICD-10-CM | POA: Diagnosis not present

## 2013-04-15 DIAGNOSIS — S72009A Fracture of unspecified part of neck of unspecified femur, initial encounter for closed fracture: Secondary | ICD-10-CM | POA: Diagnosis not present

## 2013-04-15 DIAGNOSIS — S32409A Unspecified fracture of unspecified acetabulum, initial encounter for closed fracture: Secondary | ICD-10-CM | POA: Diagnosis not present

## 2013-04-15 DIAGNOSIS — I509 Heart failure, unspecified: Secondary | ICD-10-CM | POA: Diagnosis not present

## 2013-04-15 DIAGNOSIS — Z9181 History of falling: Secondary | ICD-10-CM | POA: Diagnosis not present

## 2013-04-15 DIAGNOSIS — Z794 Long term (current) use of insulin: Secondary | ICD-10-CM | POA: Diagnosis not present

## 2013-04-15 DIAGNOSIS — S72009D Fracture of unspecified part of neck of unspecified femur, subsequent encounter for closed fracture with routine healing: Secondary | ICD-10-CM | POA: Diagnosis not present

## 2013-04-15 DIAGNOSIS — I4891 Unspecified atrial fibrillation: Secondary | ICD-10-CM | POA: Diagnosis not present

## 2013-04-15 DIAGNOSIS — E785 Hyperlipidemia, unspecified: Secondary | ICD-10-CM | POA: Diagnosis not present

## 2013-04-15 DIAGNOSIS — M6281 Muscle weakness (generalized): Secondary | ICD-10-CM | POA: Diagnosis not present

## 2013-04-15 DIAGNOSIS — I359 Nonrheumatic aortic valve disorder, unspecified: Secondary | ICD-10-CM | POA: Diagnosis not present

## 2013-04-15 DIAGNOSIS — M25559 Pain in unspecified hip: Secondary | ICD-10-CM | POA: Diagnosis not present

## 2013-04-15 DIAGNOSIS — I10 Essential (primary) hypertension: Secondary | ICD-10-CM | POA: Diagnosis not present

## 2013-04-15 LAB — CULTURE, BLOOD (ROUTINE X 2)

## 2013-04-15 LAB — PROTIME-INR
INR: 2.68 — AB (ref 0.00–1.49)
PROTHROMBIN TIME: 27.6 s — AB (ref 11.6–15.2)

## 2013-04-15 MED ORDER — CYCLOBENZAPRINE HCL 7.5 MG PO TABS: 7.5000 mg | ORAL_TABLET | Freq: Three times a day (TID) | ORAL | Status: DC | PRN

## 2013-04-15 MED ORDER — HYDROCODONE-ACETAMINOPHEN 5-325 MG PO TABS: 1.0000 | ORAL_TABLET | Freq: Four times a day (QID) | ORAL | Status: DC | PRN

## 2013-04-15 MED ORDER — TRAMADOL HCL 50 MG PO TABS: 50.0000 mg | ORAL_TABLET | Freq: Two times a day (BID) | ORAL | Status: DC

## 2013-04-15 NOTE — Discharge Summary (Addendum)
Physician Discharge Summary  Ronald [name] WJX:[id number] DOB: 29-Dec-1922 DOA: 04/10/2013  PCP: Precious Reel, MD  Admit date: 04/10/2013 Discharge date: 04/15/2013  Time spent: 35 minutes  Recommendations for Outpatient Follow-up:  Follow up with PCP in 1-2 weeks Follow up with Dr. Tonita Cong in 2-3 weeks Repeat INR on 04/19/13  Discharge Diagnoses:  Principal Problem:   Right acetabular fracture Active Problems:   DIABETES MELLITUS, TYPE II   HYPERTENSION   PVD, Rt SFA PTA/HSRA 06/17/11   CAROTID BRUIT- moderate ICA disease 5/14   AS (aortic stenosis)- s/p tissue AVR 01/07/11   Leukocytosis   Persistent atrial fibrillation   COPD (chronic obstructive pulmonary disease)   Chronic diastolic heart failure   Discharge Condition: Improved  Diet recommendation: Diabetic  Filed Weights   04/11/13 0427  Weight: 83.1 kg (183 lb 3.2 oz)    History of present illness:  Ronald [name] is a 80 y.o. male with Past medical history of bioprosthetic aortic valve replacement,, COPD, peripheral vascular disease, diabetes mellitus, neuropathy, BPH, history of a right hip arthroplasty, hypertension.  The patient is coming from home.  The patient was brought in by his family. He was in a concert and was walking out at which time he missed a step and lost his balance and fell on the ground on the right side and. He denies injuring his head or neck. Of a fall he was in excruciating pain in his right leg and was unable to bear any weight. He denies any loss of consciousness no fever no chills no nausea no vomiting no abdominal pain no cough no diarrhea no dizziness no recent medication change. He denies any tingling numbness or focal neurological deficit.  He also hit his right elbow but does not have any pain on movement.  no chest pain or palpitation or vertigo.  Hospital Course:  Interim Summary  Patient is a pleasant 80 year old gentleman with a past medical history of bioprosthetic aortic valve  replacement, type 2 diabetes mellitus, hypertension, chronic obstructive pulmonary disease, status post right hip arthroplasty who was brought to the emergency department overnight after having a fall outside his church. He states being in his usual state health, yesterday evening attended a concert at his church. He states at the end of the concert the people in the crowd were in a hurry to get to their cars. He feels that he was rushed and lost his concentration, tripping, concrete landing on his right side. Subsequently he was unable to bear weight. Imaging studies in the emergency room note comminuted minimally displaced right acetabulum fracture that involves a portion of the right superior pubic ramus. He was seen by orthopedic surgery who recommended conservative management.   Assessment/Plan:  1. Closed right acetabular fracture. Patient having a mechanical fall the evening of admission subsequently unable to bear weight. Imaging studies in the emergency department revealing a closed minimally displaced right acetabular fracture. He was seen by orthopedic surgery who recommended conservative management. Planning SNF placement. 2. Leukocytosis. Initial lab work showing a white count of 18,400. Chest x-ray did not show evidence of pneumonia nor did UA show evidence of infection. He has been afebrile. It is possible that this may be related to fracture. His white count has trended to normal. Blood cx pos for 1/2 gm pos organisms which was later pos for coag neg staph. Antibiotics stopped. 3. Thrombocytopenia. Patient's currently 68,000. I went back and reviewed medical records, it appeared he has a history of thrombocytopenia as  platelets fell down into the 50,000 range during a hospitalization in 2012. The case was ddiscussed case with Dr.Mohammed of hematology who recommended ongoing monitoring, as platelet count will likely improve the next several days.  4. Atrial fibrillation. Rate controlled  continue Lopressor 50 mg by mouth twice a day.  5. Anticoagulation for history of atrial fibrillation. INR therapeutic on day of discharge. Recommend close INR monitoring. 6. HTN. Patient initially presented with elevated blood pressures likely secondary to pain. Continue metoprolol 50 mg by mouth twice a day and benazepril 10 mg by mouth daily. 7. Type 2 diabetes mellitus, insulin-dependent. Continue Lantus 19 units subcutaneous daily with Accu-Cheks q. a.c. and each bedtime. He had an a.m. blood sugar of 116. Consultations:  Orthopedic surgery  Discharge Exam: Filed Vitals:   04/14/13 0300 04/14/13 1422 04/14/13 2320 04/15/13 0540  BP: 164/72 149/59 159/54 152/62  Pulse: 83 92 66 70  Temp: 98.7 F (37.1 C) 98 F (36.7 C) 98.4 F (36.9 C) 98.2 F (36.8 C)  TempSrc: Oral Oral Oral Oral  Resp: 18 20 20 20   Height:      Weight:      SpO2: 98% 95% 95% 94%    General: awake, in nad Cardiovascular: regular, s1, s2 Respiratory: normal resp effort, no wheezing  Discharge Instructions       Future Appointments Provider Department Dept Phone   05/05/2013 10:00 AM Tommy Medal, RPH-CPP [facility] [phone]   05/11/2013 1:00 PM [facility] [address] [facility] [address] [phone]   Liquids only 4 hours prior to your exam. Any medications can be taken as usual. Please arrive 15 min prior to your scheduled exam time.   06/08/2013 10:00 AM Lorretta Harp, MD [facility] [phone]       Medication List         alendronate 70 MG tablet  Commonly known as:  FOSAMAX  Take 70 mg by mouth every 14 (fourteen) days. Take with a full glass of water on an empty stomach.     ANORO ELLIPTA 62.5-25 MCG/INH Aepb  Generic drug:  Umeclidinium-Vilanterol  Inhale 1 puff into the lungs daily.     benazepril 10 MG tablet  Commonly known as:  LOTENSIN  Take 10 mg by mouth daily.     cyclobenzaprine 7.5 MG tablet  Commonly known as:  FEXMID  Take  1 tablet (7.5 mg total) by mouth 3 (three) times daily as needed for muscle spasms.     doxazosin 4 MG tablet  Commonly known as:  CARDURA  Take 4 mg by mouth daily.     finasteride 5 MG tablet  Commonly known as:  PROSCAR  Take 5 mg by mouth daily.     furosemide 40 MG tablet  Commonly known as:  LASIX  Take 1 tablet (40 mg total) by mouth 2 (two) times daily.     glyBURIDE 5 MG tablet  Commonly known as:  DIABETA  Take 2.5-10 mg by mouth 2 (two) times daily with a meal. 2 tablets in the morning and 1/2 tablet in the evening     HYDROcodone-acetaminophen 5-325 MG per tablet  Commonly known as:  NORCO/VICODIN  Take 1-2 tablets by mouth every 6 (six) hours as needed for moderate pain.     insulin glargine 100 UNIT/ML injection  Commonly known as:  LANTUS  Inject 19 Units into the skin every evening.     loratadine 10 MG tablet  Commonly known as:  CLARITIN  Take 10 mg by mouth daily.     metoprolol 50 MG tablet  Commonly known as:  LOPRESSOR  Take 50 mg by mouth 2 (two) times daily.     potassium chloride SA 20 MEQ tablet  Commonly known as:  K-DUR,KLOR-CON  Take 20 mEq by mouth daily.     simvastatin 20 MG tablet  Commonly known as:  ZOCOR  Take 20 mg by mouth every evening.     traMADol 50 MG tablet  Commonly known as:  ULTRAM  Take 50 mg by mouth 2 (two) times daily.     warfarin 5 MG tablet  Commonly known as:  COUMADIN  Take 2.5 mg by mouth every evening.       Allergies  Allergen Reactions  . Aspirin Anaphylaxis, Shortness Of Breath and Rash    "broke out in white welts; red blotches neck and face; windpipe closing; ~ 1962"   Follow-up Information   Follow up with Precious Reel, MD. Schedule an appointment as soon as possible for a visit in 1 week.   Specialty:  Internal Medicine   Contact information:   [facility] [place] [address] [phone]       Follow up with BEANE,JEFFREY C, MD. Schedule an  appointment as soon as possible for a visit in 2 weeks.   Specialty:  Orthopedic Surgery   Contact information:   [name] [place] [address] [phone]        The results of significant diagnostics from this hospitalization (including imaging, microbiology, ancillary and laboratory) are listed below for reference.    Significant Diagnostic Studies: Dg Chest 2 View  04/12/2013   CLINICAL DATA:  Elevated white blood cell count.  EXAM: CHEST  2 VIEW  COMPARISON:  DG CHEST 1V PORT dated 04/11/2013; CT CHEST W/O CM dated 11/11/2012; DG CHEST 1 VIEW dated 07/06/2012  FINDINGS: Mediastinum and hilar structures are normal. Prior median sternotomy and aortic valve replacement. Left atrial clip noted. Cardiomegaly, no pulmonary venous congestion. Mild bibasilar subsegmental atelectasis, no definite pulmonary infiltrate. Small right pleural effusion. No pneumothorax. Diffuse degenerative change and osteopenia thoracic spine.  IMPRESSION: 1. Prior median sternotomy and aortic valve replacement. Left atrial clip noted. 2. Cardiomegaly, no congestive heart failure. 3. Mild bibasilar subsegmental atelectasis. Small right pleural effusion.   Electronically Signed   By: Marcello Moores  Register   On: 04/12/2013 09:26   Dg Hip Complete Right  04/14/2013   CLINICAL DATA:  Acetabular fracture  EXAM: RIGHT HIP - COMPLETE 2+ VIEW  COMPARISON:  CT HIP*R* W/O CM dated 04/11/2013  FINDINGS: Right acetabular fracture is occult on plain radiography. Dynamic compression screw and intra medullary rod within the right proximal femur is present with callus formation. No definite acute fracture or dislocation. No breakage or loosening of the hardware. All bony structures remain anatomically aligned.  IMPRESSION: Stable anatomic alignment of the bony framework and right acetabulum fracture.   Electronically Signed   By: Maryclare Bean M.D.   On: 04/14/2013 11:07   Dg Hip Complete Right  04/11/2013   CLINICAL DATA:   80 year old male with fall and right hip pain. Initial encounter.  EXAM: RIGHT HIP - COMPLETE 2+ VIEW  COMPARISON:  ORIF films right hip 06/27/2011.  FINDINGS: Multiple iliac artery metallic stents in place. Bone mineralization is within normal limits for age. Femoral heads normally located. No acute pelvis fracture identified. Grossly intact visible proximal left femur.  Previous ORIF proximal right  femur with intra medullary rod, interlocking dynamic hip screw, and distal interlocking cortical screws. Hardware appears intact. Callus and heterotopic ossification about the proximal right femur. No acute fracture or dislocation identified. Calcified atherosclerosis continuing into the right lower extremity.  IMPRESSION: 1. Posttraumatic and postoperative changes to the proximal right femur. 2. No acute fracture or dislocation identified about the right hip or pelvis. 3. Chronic atherosclerotic/peripheral vascular disease.   Electronically Signed   By: Lars Pinks M.D.   On: 04/11/2013 00:47   Ct Head Wo Contrast  04/11/2013   CLINICAL DATA:  80 year old male status post fall. Initial encounter.  EXAM: CT HEAD WITHOUT CONTRAST  TECHNIQUE: Contiguous axial images were obtained from the base of the skull through the vertex without intravenous contrast.  COMPARISON:  None.  FINDINGS: Widespread paranasal sinus mucosal thickening, mucous retention cysts, and opacification. Mastoids and tympanic cavities are clear. Osteopenia. No scalp hematoma identified. Postoperative changes to the orbits. No acute osseous abnormality identified.  Calcified atherosclerosis at the skull base. Cerebral volume is within normal limits for age. Mild to moderate for age scattered white matter hypodensity. No midline shift, ventriculomegaly, mass effect, evidence of mass lesion, intracranial hemorrhage or evidence of cortically based acute infarction. Dominant distal right vertebral artery. No suspicious intracranial vascular hyperdensity.   IMPRESSION: No acute intracranial abnormality. No acute traumatic injury identified.   Electronically Signed   By: Lars Pinks M.D.   On: 04/11/2013 00:50   Ct Hip Right Wo Contrast  04/11/2013   CLINICAL DATA:  80 year old male status post fall with right hip pain similar to when he fractured his proximal right femur. Status post ORIF. Initial encounter.  EXAM: CT OF THE RIGHT HIP WITHOUT CONTRAST  TECHNIQUE: Multidetector CT imaging was performed according to the standard protocol. Multiplanar CT image reconstructions were also generated.  COMPARISON:  Right hip radiographs from the same day and earlier.  FINDINGS: Fracture of the roof of the right acetabulum identified (series 3, image 10). See also coronal image 37 on series 5. This fracture is comminuted, with a second fracture plane visible long the anterior floor of the acetabulum (coronal image 25). Furthermore, there is a longitudinal fracture plane extending from the acetabulum into the superior right pubic ramus.  Streak artifact from proximal right femur hardware. No definite acute fracture of the proximal right femur.  Iliac and right lower extremity calcified atherosclerosis. No intramuscular hematoma identified. No pelvic free fluid identified.  IMPRESSION: Comminuted minimally displaced right acetabulum fractures, involving a portion of the right superior pubic ramus.   Electronically Signed   By: Lars Pinks M.D.   On: 04/11/2013 01:44   Dg Chest Portable 1 View  04/11/2013   CLINICAL DATA:  80 year old male shortness of breath. Hip fracture. Initial encounter.  EXAM: PORTABLE CHEST - 1 VIEW  COMPARISON:  07/06/2012.  FINDINGS: Portable AP supine view at 0442 hrs. Acute on chronic increased interstitial markings. No pleural effusion, pneumothorax or consolidation. Stable cardiomegaly and mediastinal contours. Visualized tracheal air column is within normal limits. Osteopenia.  IMPRESSION: Pulmonary vascular congestion, no definite overt edema. No  other acute cardiopulmonary abnormality.   Electronically Signed   By: Lars Pinks M.D.   On: 04/11/2013 04:57    Microbiology: Recent Results (from the past 240 hour(s))  CULTURE, BLOOD (ROUTINE X 2)     Status: None   Collection Time    04/12/13  3:50 PM      Result Value Ref Range Status   Specimen  Description BLOOD RIGHT ARM   Final   Special Requests BOTTLES DRAWN AEROBIC AND ANAEROBIC 10CC EACH   Final   Culture  Setup Time     Final   Value: 04/12/2013 21:58     Performed at [facility]   Culture     Final   Value:        BLOOD CULTURE RECEIVED NO GROWTH TO DATE CULTURE WILL BE HELD FOR 5 DAYS BEFORE ISSUING A FINAL NEGATIVE REPORT     Performed at [facility]   Report Status PENDING   Incomplete  CULTURE, BLOOD (ROUTINE X 2)     Status: None   Collection Time    04/12/13  4:00 PM      Result Value Ref Range Status   Specimen Description BLOOD LEFT ARM   Final   Special Requests     Final   Value: BOTTLES DRAWN AEROBIC AND ANAEROBIC 10CC BOTH BOTTLES   Culture  Setup Time     Final   Value: 04/12/2013 21:58     Performed at [facility]   Culture     Final   Value: STAPHYLOCOCCUS SPECIES (COAGULASE NEGATIVE)     Note: THE SIGNIFICANCE OF ISOLATING THIS ORGANISM FROM A SINGLE SET OF BLOOD CULTURES WHEN MULTIPLE SETS ARE DRAWN IS UNCERTAIN. PLEASE NOTIFY THE MICROBIOLOGY DEPARTMENT WITHIN ONE WEEK IF SPECIATION AND SENSITIVITIES ARE REQUIRED.     17 Note: Gram Stain Report Called to,Read Back By and Verified With: Shirlean Kelly RN 2 15 [id number] EDMOJ     Performed at [facility]   Report Status 04/15/2013 FINAL   Final     Labs: Basic Metabolic Panel:  Recent Labs Lab 04/11/13 0050 04/12/13 0420 04/13/13 0418  NA 141 138 135*  K 3.8 3.8 3.6*  CL 101 101 100  CO2 28 25 25   GLUCOSE 187* 160* 95  BUN 22 25* 24*  CREATININE 1.29 1.15 1.03  CALCIUM 9.8 8.7 8.6   Liver Function Tests:  Recent Labs Lab 04/11/13 0050  AST 25  ALT  28  ALKPHOS 88  BILITOT 0.7  PROT 7.3  ALBUMIN 3.7   No results found for this basename: LIPASE, AMYLASE,  in the last 168 hours No results found for this basename: AMMONIA,  in the last 168 hours CBC:  Recent Labs Lab 04/11/13 0050 04/12/13 0420 04/13/13 0418 04/14/13 0438  WBC 18.4* 15.4* 12.9* 8.6  NEUTROABS 16.0*  --   --   --   HGB 14.3 12.6* 11.8* 11.1*  HCT 41.2 36.1* 34.4* 32.2*  MCV 92.8 91.4 91.2 91.2  PLT 87* 71* 61* 68*   Cardiac Enzymes:  Recent Labs Lab 04/11/13 0050  TROPONINI <0.30   BNP: BNP (last 3 results)  Recent Labs  07/06/12 1611 07/08/12 0509  PROBNP 1003.0* 1105.0*   CBG:  Recent Labs Lab 04/12/13 0737 04/12/13 1141 04/12/13 1638 04/12/13 2106 04/13/13 0755  GLUCAP 154* 293* 246* 230* 116*    Signed:  CHIU, STEPHEN K  [facility] 04/15/2013, 12:31 PM \

## 2013-04-15 NOTE — Progress Notes (Signed)
Clinical Social Work  CSW faxed DC summary to [employer] and was informed that son was to complete paperwork at 2pm and agreeable to accept patient. CSW informed patient, RN of DC plans and called and left a message with son. CSW prepared DC packet with FL2 and hard scripts included. CSW coordinated transportation via [facility] per patient request. Request #: [phone].   CSW is signing off but available if needed.  Sindy Messing, LCSW  (Coverage for [employer])

## 2013-04-15 NOTE — Discharge Instructions (Signed)
Repeat INR on Monday (04/19/13)

## 2013-04-15 NOTE — Progress Notes (Signed)
ANTICOAGULATION CONSULT NOTE - Follow Up Consult  Pharmacy Consult for warfarin Indication: atrial fibrillation  Allergies  Allergen Reactions  . Aspirin Anaphylaxis, Shortness Of Breath and Rash    "broke out in white welts; red blotches neck and face; windpipe closing; ~ 1962"   Labs:  Recent Labs  04/13/13 0418 04/14/13 0438 04/15/13 0402  HGB 11.8* 11.1*  --   HCT 34.4* 32.2*  --   PLT 61* 68*  --   LABPROT 19.7* 18.9* 27.6*  INR 1.72* 1.63* 2.68*  CREATININE 1.03  --   --     Assessment: 89 yoM on warfarin PTA for h/o atrial fibrillation presented 2/15 s/p fall. CT head negative for injuries, patient found with minimally displaced R hip fracture. Ortho on board and recommended conservative/non-operative management. Warfarin was ordered to be continued inpatient. Pt was reportedly receiving warfarin 2.5mg  po daily with last dose on 2/14.   INR today 2.68. Large rate INR rise overnight, dose increased last night for INR not moving upward x 3 days  No CBC today, on 2/18- plts =  68K. No bleeding documented.   Goal of Therapy:  INR 2-3 Monitor platelets by anticoagulation protocol: Yes   Plan:   Based on rate of INR rise overnight, hold dose tonight  Daily PT/INR  Doreene Eland, PharmD, BCPS.   Pager: [phone] 04/15/2013 8:45 AM

## 2013-04-16 LAB — GLUCOSE, CAPILLARY
GLUCOSE-CAPILLARY: 289 mg/dL — AB (ref 70–99)
GLUCOSE-CAPILLARY: 288 mg/dL — AB (ref 70–99)
GLUCOSE-CAPILLARY: 259 mg/dL — AB (ref 70–99)
GLUCOSE-CAPILLARY: 162 mg/dL — AB (ref 70–99)
Glucose-Capillary: 277 mg/dL — ABNORMAL HIGH (ref 70–99)
Glucose-Capillary: 183 mg/dL — ABNORMAL HIGH (ref 70–99)
Glucose-Capillary: 227 mg/dL — ABNORMAL HIGH (ref 70–99)
Glucose-Capillary: 240 mg/dL — ABNORMAL HIGH (ref 70–99)
Glucose-Capillary: 298 mg/dL — ABNORMAL HIGH (ref 70–99)

## 2013-04-18 LAB — CULTURE, BLOOD (ROUTINE X 2): CULTURE: NO GROWTH

## 2013-04-23 ENCOUNTER — Non-Acute Institutional Stay (SKILLED_NURSING_FACILITY): Payer: Medicare Other | Admitting: Internal Medicine

## 2013-04-23 DIAGNOSIS — I5032 Chronic diastolic (congestive) heart failure: Secondary | ICD-10-CM | POA: Diagnosis not present

## 2013-04-23 DIAGNOSIS — D72829 Elevated white blood cell count, unspecified: Secondary | ICD-10-CM | POA: Diagnosis not present

## 2013-04-23 DIAGNOSIS — S32409A Unspecified fracture of unspecified acetabulum, initial encounter for closed fracture: Secondary | ICD-10-CM | POA: Diagnosis not present

## 2013-04-23 DIAGNOSIS — I4891 Unspecified atrial fibrillation: Secondary | ICD-10-CM | POA: Diagnosis not present

## 2013-04-23 DIAGNOSIS — I10 Essential (primary) hypertension: Secondary | ICD-10-CM

## 2013-04-23 DIAGNOSIS — S32401A Unspecified fracture of right acetabulum, initial encounter for closed fracture: Secondary | ICD-10-CM

## 2013-04-23 DIAGNOSIS — E119 Type 2 diabetes mellitus without complications: Secondary | ICD-10-CM

## 2013-04-23 DIAGNOSIS — I739 Peripheral vascular disease, unspecified: Secondary | ICD-10-CM

## 2013-04-23 DIAGNOSIS — I4819 Other persistent atrial fibrillation: Secondary | ICD-10-CM

## 2013-04-24 ENCOUNTER — Encounter: Payer: Self-pay | Admitting: Internal Medicine

## 2013-04-24 NOTE — Progress Notes (Signed)
MRN: [id number] Name: Ronald [name]  Sex: male Age: 80 y.o. DOB: June 15, 1922  [place] #: [facility] [address] Facility/Room: 1104 Level Of Care: SNF Provider: Inocencio Homes D Emergency Contacts: Extended Emergency Contact Information Primary Emergency Contact: [facility] Address: [address]          [place], [place] [address] Johnnette Litter of [place] Phone: [phone] Work Phone: [phone] Mobile Phone: [phone] Relation: Son Secondary Emergency Contact: Marvell Fuller Address: [address]          [place], [place] [address] Johnnette Litter of [place] Phone: [phone] Mobile Phone: [phone] Relation: Daughter    Allergies: Aspirin  Chief Complaint  Patient presents with  . nursing home admission    HPI: Patient is 80 y.o. male who fell after his hip replacement and sustained a R minimally displaced R acetabular fx who is admitted to SNF for OT/PT.  Past Medical History  Diagnosis Date  . AS (aortic stenosis)     bovine aortic valve replacement 01/07/11  . HTN (hypertension)   . Thrombocytopenia due to drugs     seen by Dr Inda Merlin plts 114000 no rx  . Peripheral vascular disease very poor circulation legs and feet ... stents right and left legs... done in dr j. Gwenlyn Found 's office.   . Depression wife died 4 years ago.    Marland Kitchen COPD (chronic obstructive pulmonary disease)   . Shortness of breath   . Recurrent upper respiratory infection (URI)     sinusitis  . Claudication in peripheral vascular disease 06/17/2011  . S/P angioplasty with stent, diamond back rotational athrectomy Prox. Rt. SFA 06/17/2011 06/17/2011  . Neuropathy due to secondary diabetes   . Type II diabetes mellitus   . Pneumonia 07/25/11    left  . Persistent atrial fibrillation   . Blood transfusion     w/hip operation  . History of stomach ulcers ~ 1951  . Renal artery stenosis 2006    renal artery stent  . Bladder cancer     Bladder Cancer local  . Cellulitis of left lower  extremity   . Chronic diastolic congestive heart failure   . Normal coronary arteries Sept 2012    Past Surgical History  Procedure Laterality Date  . Peripheral arterial stent graft      2006 left anf right illiac stents Dr Deon Pilling  . Aortic valve replacement  01/07/2011    Procedure: AORTIC VALVE REPLACEMENT (AVR);  Surgeon: Grace Isaac, MD;  Location: [facility];  Service: Open Heart Surgery;  Laterality: N/A;; magna-ease bovine 59mm bioprosthesis  . Cardioversion  04/03/2011    Procedure: CARDIOVERSION;  Surgeon: Leonie Man, MD;  Location: [facility];  Service: Cardiovascular;  Laterality: N/A;  . Lower extremity angiogram  06/17/2011    diamondback orbital rotational and cutting balloon atherectomy of the prox R SFA  . Femur im nail  06/27/2011    Procedure: INTRAMEDULLARY (IM) NAIL FEMORAL;  Surgeon: Marin Shutter, MD;  Location: [facility];  Service: Orthopedics;  Laterality: Right;  . Tonsillectomy and adenoidectomy      "when I was a kid"  . Cholecystectomy    . Cataract extraction w/ intraocular lens  implant, bilateral  ~ 2007  . Renal artery stent  2006    "I believe"  . Cardiac catheterization  11/19/10    normal coronaries, mod AS, 75% l RAS      Medication List       This list is accurate as of: 04/23/13 11:59 PM.  Always use your most recent med list.               alendronate 70 MG tablet  Commonly known as:  FOSAMAX  Take 70 mg by mouth every 14 (fourteen) days. Take with a full glass of water on an empty stomach.     ANORO ELLIPTA 62.5-25 MCG/INH Aepb  Generic drug:  Umeclidinium-Vilanterol  Inhale 1 puff into the lungs daily.     benazepril 10 MG tablet  Commonly known as:  LOTENSIN  Take 10 mg by mouth daily.     cyclobenzaprine 7.5 MG tablet  Commonly known as:  FEXMID  Take 1 tablet (7.5 mg total) by mouth 3 (three) times daily as needed for muscle spasms.     doxazosin 4 MG tablet  Commonly known as:  CARDURA  Take 4 mg by mouth daily.      finasteride 5 MG tablet  Commonly known as:  PROSCAR  Take 5 mg by mouth daily.     furosemide 40 MG tablet  Commonly known as:  LASIX  Take 1 tablet (40 mg total) by mouth 2 (two) times daily.     glyBURIDE 5 MG tablet  Commonly known as:  DIABETA  Take 2.5-10 mg by mouth 2 (two) times daily with a meal. 2 tablets in the morning and 1/2 tablet in the evening     HYDROcodone-acetaminophen 5-325 MG per tablet  Commonly known as:  NORCO/VICODIN  Take 1-2 tablets by mouth every 6 (six) hours as needed for moderate pain.     insulin glargine 100 UNIT/ML injection  Commonly known as:  LANTUS  Inject 19 Units into the skin every evening.     loratadine 10 MG tablet  Commonly known as:  CLARITIN  Take 10 mg by mouth daily.     metoprolol 50 MG tablet  Commonly known as:  LOPRESSOR  Take 50 mg by mouth 2 (two) times daily.     potassium chloride SA 20 MEQ tablet  Commonly known as:  K-DUR,KLOR-CON  Take 20 mEq by mouth daily.     simvastatin 20 MG tablet  Commonly known as:  ZOCOR  Take 20 mg by mouth every evening.     traMADol 50 MG tablet  Commonly known as:  ULTRAM  Take 1 tablet (50 mg total) by mouth 2 (two) times daily.     warfarin 5 MG tablet  Commonly known as:  COUMADIN  Take 2.5 mg by mouth every evening.        No orders of the defined types were placed in this encounter.    Immunization History  Administered Date(s) Administered  . Influenza Split 12/27/2011, 11/09/2012  . Pneumococcal Polysaccharide-23 02/26/2010  . Tdap 06/26/2011    History  Substance Use Topics  . Smoking status: Current Every Day Smoker -- 1.00 packs/day for 75 years    Types: Cigarettes  . Smokeless tobacco: Never Used  . Alcohol Use: 1.2 oz/week    2 Cans of beer per week    Family history is noncontributory    Review of Systems  DATA OBTAINED: from patient GENERAL: Feels well no fevers, fatigue, appetite changes SKIN: No itching, rash or wounds EYES: No eye pain,  redness, discharge EARS: No earache, tinnitus, change in hearing NOSE: No congestion, drainage or bleeding  MOUTH/THROAT: No mouth or tooth pain, No sore throat, No difficulty chewing or swallowing  RESPIRATORY: No cough, wheezing, SOB CARDIAC: No chest pain, palpitations, lower extremity edema  GI: No abdominal pain, No N/V/D or constipation, No heartburn or reflux  GU: No dysuria, frequency or urgency, or incontinence  MUSCULOSKELETAL: no pain when still;hurts with movement NEUROLOGIC: No headache, dizziness or focal weakness PSYCHIATRIC: No overt anxiety or sadness. Sleeps well. No behavior issue.   Filed Vitals:   04/24/13 1823  BP: 138/74  Pulse: 80  Temp: 98.8 F (37.1 C)  Resp: 20    Physical Exam  GENERAL APPEARANCE: Alert, conversant. Appropriately groomed. No acute distress.  SKIN: No diaphoresis rash, or wounds HEAD: Normocephalic, atraumatic  EYES: Conjunctiva/lids clear. Pupils round, reactive. EOMs intact.  EARS: External exam WNL, canals clear. Hearing grossly normal.  NOSE: No deformity or discharge.  MOUTH/THROAT: Lips w/o lesions.  RESPIRATORY: Breathing is even, unlabored. Lung sounds are clear   CARDIOVASCULAR: Heart RRR no murmurs, rubs or gallops. Trace RLE peripheral edema.  GASTROINTESTINAL: Abdomen is soft, non-tender, not distended w/ normal bowel sounds GENITOURINARY: Bladder non tender, not distended  MUSCULOSKELETAL: No abnormal joints or musculature NEUROLOGIC: Oriented X3. Cranial nerves 2-12 grossly intact. Moves all extremities no tremor. PSYCHIATRIC: Mood and affect appropriate to situation, no behavioral issues  Patient Active Problem List   Diagnosis Date Noted  . Right acetabular fracture 04/11/2013  . Acetabular fracture 04/11/2013  . Chronic renal insufficiency, stage IV (severe) 12/24/2012  . Bilateral claudication of lower limb 12/24/2012  . Cellulitis 12/02/2012  . History of tobacco abuse- 75 years, quit in Feb 2014 07/06/2012   . Chronic diastolic heart failure 74/09/1446  . COPD (chronic obstructive pulmonary disease) 05/27/2012  . Pulmonary nodules 05/27/2012  . Normal coronary arteries, cath 11/12 07/25/2011  . Normal left ventricular systolic function, Echo 1/85 07/25/2011  . Closed right hip fracture, ORIF 07/28/12 complicated by gluteal hematoma while being Coumadinized post op 06/27/2011  . Aspirin allergy, rash, SOB. Pt is on Plavix 06/19/2011  . Persistent atrial fibrillation 06/19/2011  . Thrombocytopenia 01/11/2011  . Leukocytosis 01/11/2011  . S/P aortic valve replacement: #23 Magna Ease Edwards Pericardial Valve  November 2012 01/08/2011  . AS (aortic stenosis)- s/p tissue AVR 01/07/11   . CAROTID BRUIT- moderate ICA disease 5/14 05/31/2008  . DIABETES MELLITUS, TYPE II 07/11/2007  . HYPERLIPIDEMIA 07/11/2007  . DEPRESSION 07/11/2007  . RESTLESS LEGS SYNDROME 07/11/2007  . HYPERTENSION 07/11/2007  . PVD, Rt SFA PTA/HSRA 06/17/11 07/11/2007  . DIVERTICULOSIS, COLON 07/11/2007  . RENAL CYST 07/11/2007  . ARTHRITIS 07/11/2007  . SKIN CANCER, HX OF 07/11/2007  . RHEUMATIC HEART DISEASE, HX OF 07/11/2007    CBC    Component Value Date/Time   WBC 8.6 04/14/2013 0438   WBC 8.6 02/12/2010 1214   RBC 3.53* 04/14/2013 0438   RBC 4.08* 02/12/2010 1214   HGB 11.1* 04/14/2013 0438   HGB 13.4 02/12/2010 1214   HCT 32.2* 04/14/2013 0438   HCT 39.1 02/12/2010 1214   PLT 68* 04/14/2013 0438   PLT 114* 02/12/2010 1214   MCV 91.2 04/14/2013 0438   MCV 95.8 02/12/2010 1214   LYMPHSABS 1.5 04/11/2013 0050   LYMPHSABS 1.7 02/12/2010 1214   MONOABS 0.7 04/11/2013 0050   MONOABS 0.7 02/12/2010 1214   EOSABS 0.2 04/11/2013 0050   EOSABS 0.2 02/12/2010 1214   BASOSABS 0.0 04/11/2013 0050   BASOSABS 0.0 02/12/2010 1214    CMP     Component Value Date/Time   NA 135* 04/13/2013 0418   K 3.6* 04/13/2013 0418   CL 100 04/13/2013 0418   CO2 25 04/13/2013 0418   GLUCOSE 95  04/13/2013 0418   BUN 24* 04/13/2013 0418    CREATININE 1.03 04/13/2013 0418   CALCIUM 8.6 04/13/2013 0418   PROT 7.3 04/11/2013 0050   ALBUMIN 3.7 04/11/2013 0050   AST 25 04/11/2013 0050   ALT 28 04/11/2013 0050   ALKPHOS 88 04/11/2013 0050   BILITOT 0.7 04/11/2013 0050   GFRNONAA 62* 04/13/2013 0418   GFRAA 72* 04/13/2013 0418    Assessment and Plan  Right acetabular fracture Sec to fall ; per ortho conservative tx;SNF with OT/PT  Leukocytosis Source for infection sought but not found; possibly from the fall and fx  Persistent atrial fibrillation Rate controlled with lopressor;warfarin prophlaxis  Chronic diastolic heart failure Lasix, bblocker , ACE  HYPERTENSION Cardura, lopressor,lasix,lotension-continue all  DIABETES MELLITUS, TYPE II Continue lantus, SSI TID, and glyburide;pt on ACE    Hennie Duos, MD

## 2013-04-24 NOTE — Assessment & Plan Note (Addendum)
Continue lantus, SSI TID, and glyburide;pt on ACE

## 2013-04-24 NOTE — Assessment & Plan Note (Signed)
Stents BLE; coumadin and statin

## 2013-04-24 NOTE — Assessment & Plan Note (Signed)
Source for infection sought but not found; possibly from the fall and fx

## 2013-04-24 NOTE — Assessment & Plan Note (Signed)
Cardura, lopressor,lasix,lotension-continue all

## 2013-04-24 NOTE — Assessment & Plan Note (Signed)
Sec to fall ; per ortho conservative tx;SNF with OT/PT

## 2013-04-24 NOTE — Assessment & Plan Note (Signed)
Rate controlled with lopressor;warfarin prophlaxis

## 2013-04-24 NOTE — Assessment & Plan Note (Signed)
Lasix, bblocker , ACE

## 2013-04-27 ENCOUNTER — Encounter: Payer: Self-pay | Admitting: Adult Health

## 2013-04-27 DIAGNOSIS — S32409A Unspecified fracture of unspecified acetabulum, initial encounter for closed fracture: Secondary | ICD-10-CM | POA: Diagnosis not present

## 2013-05-05 ENCOUNTER — Ambulatory Visit (INDEPENDENT_AMBULATORY_CARE_PROVIDER_SITE_OTHER): Payer: Medicare Other | Admitting: Pharmacist Clinician (PhC)/ Clinical Pharmacy Specialist

## 2013-05-05 DIAGNOSIS — S72009D Fracture of unspecified part of neck of unspecified femur, subsequent encounter for closed fracture with routine healing: Secondary | ICD-10-CM | POA: Diagnosis not present

## 2013-05-05 DIAGNOSIS — I4891 Unspecified atrial fibrillation: Secondary | ICD-10-CM | POA: Diagnosis not present

## 2013-05-05 DIAGNOSIS — E119 Type 2 diabetes mellitus without complications: Secondary | ICD-10-CM | POA: Diagnosis not present

## 2013-05-05 DIAGNOSIS — R269 Unspecified abnormalities of gait and mobility: Secondary | ICD-10-CM | POA: Diagnosis not present

## 2013-05-05 DIAGNOSIS — I4819 Other persistent atrial fibrillation: Secondary | ICD-10-CM

## 2013-05-05 DIAGNOSIS — Z7901 Long term (current) use of anticoagulants: Secondary | ICD-10-CM | POA: Diagnosis not present

## 2013-05-05 DIAGNOSIS — IMO0001 Reserved for inherently not codable concepts without codable children: Secondary | ICD-10-CM | POA: Diagnosis not present

## 2013-05-05 DIAGNOSIS — I5032 Chronic diastolic (congestive) heart failure: Secondary | ICD-10-CM | POA: Diagnosis not present

## 2013-05-05 DIAGNOSIS — Z5181 Encounter for therapeutic drug level monitoring: Secondary | ICD-10-CM | POA: Diagnosis not present

## 2013-05-05 LAB — POCT INR: INR: 1.8

## 2013-05-11 ENCOUNTER — Ambulatory Visit (INDEPENDENT_AMBULATORY_CARE_PROVIDER_SITE_OTHER)
Admission: RE | Admit: 2013-05-11 | Discharge: 2013-05-11 | Disposition: A | Discharge status: Home / Self Care | Payer: Medicare Other | Source: Ambulatory Visit | Attending: Emergency Medicine | Admitting: Emergency Medicine

## 2013-05-11 DIAGNOSIS — R269 Unspecified abnormalities of gait and mobility: Secondary | ICD-10-CM | POA: Diagnosis not present

## 2013-05-11 DIAGNOSIS — IMO0001 Reserved for inherently not codable concepts without codable children: Secondary | ICD-10-CM | POA: Diagnosis not present

## 2013-05-11 DIAGNOSIS — J438 Other emphysema: Secondary | ICD-10-CM | POA: Diagnosis not present

## 2013-05-11 DIAGNOSIS — I5032 Chronic diastolic (congestive) heart failure: Secondary | ICD-10-CM | POA: Diagnosis not present

## 2013-05-11 DIAGNOSIS — R918 Other nonspecific abnormal finding of lung field: Secondary | ICD-10-CM | POA: Diagnosis not present

## 2013-05-11 DIAGNOSIS — S72009D Fracture of unspecified part of neck of unspecified femur, subsequent encounter for closed fracture with routine healing: Secondary | ICD-10-CM | POA: Diagnosis not present

## 2013-05-11 DIAGNOSIS — Z5181 Encounter for therapeutic drug level monitoring: Secondary | ICD-10-CM | POA: Diagnosis not present

## 2013-05-11 DIAGNOSIS — E119 Type 2 diabetes mellitus without complications: Secondary | ICD-10-CM | POA: Diagnosis not present

## 2013-05-12 DIAGNOSIS — N401 Enlarged prostate with lower urinary tract symptoms: Secondary | ICD-10-CM | POA: Diagnosis not present

## 2013-05-12 DIAGNOSIS — D090 Carcinoma in situ of bladder: Secondary | ICD-10-CM | POA: Diagnosis not present

## 2013-05-12 DIAGNOSIS — N139 Obstructive and reflux uropathy, unspecified: Secondary | ICD-10-CM | POA: Diagnosis not present

## 2013-05-13 ENCOUNTER — Telehealth: Payer: Self-pay | Admitting: Cardiovascular Disease

## 2013-05-13 DIAGNOSIS — S72009D Fracture of unspecified part of neck of unspecified femur, subsequent encounter for closed fracture with routine healing: Secondary | ICD-10-CM | POA: Diagnosis not present

## 2013-05-13 DIAGNOSIS — Z5181 Encounter for therapeutic drug level monitoring: Secondary | ICD-10-CM | POA: Diagnosis not present

## 2013-05-13 DIAGNOSIS — IMO0001 Reserved for inherently not codable concepts without codable children: Secondary | ICD-10-CM | POA: Diagnosis not present

## 2013-05-13 DIAGNOSIS — E119 Type 2 diabetes mellitus without complications: Secondary | ICD-10-CM | POA: Diagnosis not present

## 2013-05-13 DIAGNOSIS — I5032 Chronic diastolic (congestive) heart failure: Secondary | ICD-10-CM | POA: Diagnosis not present

## 2013-05-13 DIAGNOSIS — R269 Unspecified abnormalities of gait and mobility: Secondary | ICD-10-CM | POA: Diagnosis not present

## 2013-05-13 LAB — POCT INR: INR: 3.1

## 2013-05-13 NOTE — Telephone Encounter (Signed)
PT 36.7 INR 3.1  Stated pt was started on Abx on Monday. Informed pharmacist will be notified and call back w/ instructions.  Message forwarded to Tommy Medal, PharmD.

## 2013-05-14 ENCOUNTER — Other Ambulatory Visit: Payer: Medicare Other

## 2013-05-14 ENCOUNTER — Ambulatory Visit (INDEPENDENT_AMBULATORY_CARE_PROVIDER_SITE_OTHER): Payer: Medicare Other | Admitting: Pharmacist Clinician (PhC)/ Clinical Pharmacy Specialist

## 2013-05-14 DIAGNOSIS — Z5181 Encounter for therapeutic drug level monitoring: Secondary | ICD-10-CM | POA: Diagnosis not present

## 2013-05-14 DIAGNOSIS — I5032 Chronic diastolic (congestive) heart failure: Secondary | ICD-10-CM | POA: Diagnosis not present

## 2013-05-14 DIAGNOSIS — R269 Unspecified abnormalities of gait and mobility: Secondary | ICD-10-CM | POA: Diagnosis not present

## 2013-05-14 DIAGNOSIS — S72009D Fracture of unspecified part of neck of unspecified femur, subsequent encounter for closed fracture with routine healing: Secondary | ICD-10-CM | POA: Diagnosis not present

## 2013-05-14 DIAGNOSIS — E119 Type 2 diabetes mellitus without complications: Secondary | ICD-10-CM | POA: Diagnosis not present

## 2013-05-14 DIAGNOSIS — I4891 Unspecified atrial fibrillation: Secondary | ICD-10-CM

## 2013-05-14 DIAGNOSIS — I4819 Other persistent atrial fibrillation: Secondary | ICD-10-CM

## 2013-05-14 DIAGNOSIS — IMO0001 Reserved for inherently not codable concepts without codable children: Secondary | ICD-10-CM | POA: Diagnosis not present

## 2013-05-17 DIAGNOSIS — E119 Type 2 diabetes mellitus without complications: Secondary | ICD-10-CM | POA: Diagnosis not present

## 2013-05-17 DIAGNOSIS — Z5181 Encounter for therapeutic drug level monitoring: Secondary | ICD-10-CM | POA: Diagnosis not present

## 2013-05-17 DIAGNOSIS — R269 Unspecified abnormalities of gait and mobility: Secondary | ICD-10-CM | POA: Diagnosis not present

## 2013-05-17 DIAGNOSIS — IMO0001 Reserved for inherently not codable concepts without codable children: Secondary | ICD-10-CM | POA: Diagnosis not present

## 2013-05-17 DIAGNOSIS — S72009D Fracture of unspecified part of neck of unspecified femur, subsequent encounter for closed fracture with routine healing: Secondary | ICD-10-CM | POA: Diagnosis not present

## 2013-05-17 DIAGNOSIS — I5032 Chronic diastolic (congestive) heart failure: Secondary | ICD-10-CM | POA: Diagnosis not present

## 2013-05-18 DIAGNOSIS — IMO0001 Reserved for inherently not codable concepts without codable children: Secondary | ICD-10-CM | POA: Diagnosis not present

## 2013-05-18 DIAGNOSIS — I5032 Chronic diastolic (congestive) heart failure: Secondary | ICD-10-CM | POA: Diagnosis not present

## 2013-05-18 DIAGNOSIS — R269 Unspecified abnormalities of gait and mobility: Secondary | ICD-10-CM | POA: Diagnosis not present

## 2013-05-18 DIAGNOSIS — S72009D Fracture of unspecified part of neck of unspecified femur, subsequent encounter for closed fracture with routine healing: Secondary | ICD-10-CM | POA: Diagnosis not present

## 2013-05-18 DIAGNOSIS — E119 Type 2 diabetes mellitus without complications: Secondary | ICD-10-CM | POA: Diagnosis not present

## 2013-05-18 DIAGNOSIS — Z5181 Encounter for therapeutic drug level monitoring: Secondary | ICD-10-CM | POA: Diagnosis not present

## 2013-05-19 ENCOUNTER — Telehealth: Payer: Self-pay | Admitting: Emergency Medicine

## 2013-05-19 ENCOUNTER — Ambulatory Visit: Payer: Medicare Other | Admitting: Pharmacist Clinician (PhC)/ Clinical Pharmacy Specialist

## 2013-05-19 NOTE — Telephone Encounter (Signed)
Please let him know that his CT scan shows that one of the areas we have been following is unchanged in size and that the other is slightly smaller / improved. This is good news.   Also, please find out if he tolerated the change from Advair to Anoro. Thanks.

## 2013-05-19 NOTE — Telephone Encounter (Signed)
Left detailed message and advised that I would send RB a message to call or send back info of CT scan results. RB please advise of pt's CT result, thanks

## 2013-05-20 ENCOUNTER — Ambulatory Visit (INDEPENDENT_AMBULATORY_CARE_PROVIDER_SITE_OTHER): Payer: Medicare Other | Admitting: Pharmacist Clinician (PhC)/ Clinical Pharmacy Specialist

## 2013-05-20 DIAGNOSIS — S72009D Fracture of unspecified part of neck of unspecified femur, subsequent encounter for closed fracture with routine healing: Secondary | ICD-10-CM | POA: Diagnosis not present

## 2013-05-20 DIAGNOSIS — I4819 Other persistent atrial fibrillation: Secondary | ICD-10-CM

## 2013-05-20 DIAGNOSIS — E119 Type 2 diabetes mellitus without complications: Secondary | ICD-10-CM | POA: Diagnosis not present

## 2013-05-20 DIAGNOSIS — I5032 Chronic diastolic (congestive) heart failure: Secondary | ICD-10-CM | POA: Diagnosis not present

## 2013-05-20 DIAGNOSIS — I4891 Unspecified atrial fibrillation: Secondary | ICD-10-CM

## 2013-05-20 DIAGNOSIS — R269 Unspecified abnormalities of gait and mobility: Secondary | ICD-10-CM | POA: Diagnosis not present

## 2013-05-20 DIAGNOSIS — Z5181 Encounter for therapeutic drug level monitoring: Secondary | ICD-10-CM | POA: Diagnosis not present

## 2013-05-20 DIAGNOSIS — IMO0001 Reserved for inherently not codable concepts without codable children: Secondary | ICD-10-CM | POA: Diagnosis not present

## 2013-05-20 LAB — POCT INR: INR: 2.8

## 2013-05-20 NOTE — Telephone Encounter (Signed)
Kysen Wetherington pt's daughter of CT results per RB.  She verbalized understanding and had no further questions. She also states that he is doing well with the switch from advair to anoro. Nothing further needed

## 2013-05-24 DIAGNOSIS — Z5181 Encounter for therapeutic drug level monitoring: Secondary | ICD-10-CM | POA: Diagnosis not present

## 2013-05-24 DIAGNOSIS — IMO0001 Reserved for inherently not codable concepts without codable children: Secondary | ICD-10-CM | POA: Diagnosis not present

## 2013-05-24 DIAGNOSIS — S72009D Fracture of unspecified part of neck of unspecified femur, subsequent encounter for closed fracture with routine healing: Secondary | ICD-10-CM | POA: Diagnosis not present

## 2013-05-24 DIAGNOSIS — E119 Type 2 diabetes mellitus without complications: Secondary | ICD-10-CM | POA: Diagnosis not present

## 2013-05-24 DIAGNOSIS — R269 Unspecified abnormalities of gait and mobility: Secondary | ICD-10-CM | POA: Diagnosis not present

## 2013-05-24 DIAGNOSIS — I5032 Chronic diastolic (congestive) heart failure: Secondary | ICD-10-CM | POA: Diagnosis not present

## 2013-05-25 DIAGNOSIS — Z5181 Encounter for therapeutic drug level monitoring: Secondary | ICD-10-CM | POA: Diagnosis not present

## 2013-05-25 DIAGNOSIS — E119 Type 2 diabetes mellitus without complications: Secondary | ICD-10-CM | POA: Diagnosis not present

## 2013-05-25 DIAGNOSIS — R269 Unspecified abnormalities of gait and mobility: Secondary | ICD-10-CM | POA: Diagnosis not present

## 2013-05-25 DIAGNOSIS — F17200 Nicotine dependence, unspecified, uncomplicated: Secondary | ICD-10-CM | POA: Diagnosis not present

## 2013-05-25 DIAGNOSIS — I5032 Chronic diastolic (congestive) heart failure: Secondary | ICD-10-CM | POA: Diagnosis not present

## 2013-05-25 DIAGNOSIS — E1159 Type 2 diabetes mellitus with other circulatory complications: Secondary | ICD-10-CM | POA: Diagnosis not present

## 2013-05-25 DIAGNOSIS — J069 Acute upper respiratory infection, unspecified: Secondary | ICD-10-CM | POA: Diagnosis not present

## 2013-05-25 DIAGNOSIS — S72009D Fracture of unspecified part of neck of unspecified femur, subsequent encounter for closed fracture with routine healing: Secondary | ICD-10-CM | POA: Diagnosis not present

## 2013-05-25 DIAGNOSIS — D649 Anemia, unspecified: Secondary | ICD-10-CM | POA: Diagnosis not present

## 2013-05-25 DIAGNOSIS — I509 Heart failure, unspecified: Secondary | ICD-10-CM | POA: Diagnosis not present

## 2013-05-25 DIAGNOSIS — Z9181 History of falling: Secondary | ICD-10-CM | POA: Diagnosis not present

## 2013-05-25 DIAGNOSIS — I10 Essential (primary) hypertension: Secondary | ICD-10-CM | POA: Diagnosis not present

## 2013-05-25 DIAGNOSIS — IMO0001 Reserved for inherently not codable concepts without codable children: Secondary | ICD-10-CM | POA: Diagnosis not present

## 2013-05-25 DIAGNOSIS — I4891 Unspecified atrial fibrillation: Secondary | ICD-10-CM | POA: Diagnosis not present

## 2013-05-27 DIAGNOSIS — I5032 Chronic diastolic (congestive) heart failure: Secondary | ICD-10-CM | POA: Diagnosis not present

## 2013-05-27 DIAGNOSIS — Z5181 Encounter for therapeutic drug level monitoring: Secondary | ICD-10-CM | POA: Diagnosis not present

## 2013-05-27 DIAGNOSIS — IMO0001 Reserved for inherently not codable concepts without codable children: Secondary | ICD-10-CM | POA: Diagnosis not present

## 2013-05-27 DIAGNOSIS — S72009D Fracture of unspecified part of neck of unspecified femur, subsequent encounter for closed fracture with routine healing: Secondary | ICD-10-CM | POA: Diagnosis not present

## 2013-05-27 DIAGNOSIS — R269 Unspecified abnormalities of gait and mobility: Secondary | ICD-10-CM | POA: Diagnosis not present

## 2013-05-27 DIAGNOSIS — E119 Type 2 diabetes mellitus without complications: Secondary | ICD-10-CM | POA: Diagnosis not present

## 2013-05-31 ENCOUNTER — Telehealth: Payer: Self-pay | Admitting: Emergency Medicine

## 2013-05-31 DIAGNOSIS — S32409A Unspecified fracture of unspecified acetabulum, initial encounter for closed fracture: Secondary | ICD-10-CM | POA: Diagnosis not present

## 2013-05-31 DIAGNOSIS — S72009D Fracture of unspecified part of neck of unspecified femur, subsequent encounter for closed fracture with routine healing: Secondary | ICD-10-CM | POA: Diagnosis not present

## 2013-06-01 DIAGNOSIS — Z5181 Encounter for therapeutic drug level monitoring: Secondary | ICD-10-CM | POA: Diagnosis not present

## 2013-06-01 DIAGNOSIS — IMO0001 Reserved for inherently not codable concepts without codable children: Secondary | ICD-10-CM | POA: Diagnosis not present

## 2013-06-01 DIAGNOSIS — I5032 Chronic diastolic (congestive) heart failure: Secondary | ICD-10-CM | POA: Diagnosis not present

## 2013-06-01 DIAGNOSIS — R269 Unspecified abnormalities of gait and mobility: Secondary | ICD-10-CM | POA: Diagnosis not present

## 2013-06-01 DIAGNOSIS — E119 Type 2 diabetes mellitus without complications: Secondary | ICD-10-CM | POA: Diagnosis not present

## 2013-06-01 DIAGNOSIS — S72009D Fracture of unspecified part of neck of unspecified femur, subsequent encounter for closed fracture with routine healing: Secondary | ICD-10-CM | POA: Diagnosis not present

## 2013-06-01 NOTE — Telephone Encounter (Signed)
Pt's daughter otting returned call - spoke with Salinger who reported that yesterday pt mentioned to her that the side of his left breast has been hurting x2 months with tenderness to touch.  When she saw him earlier this morning and asked him about it, he told her that this is the first day that it has not hurt.  Reither wonders if this could be muscular?  Did anything show on the 3.17.15 CT Chest?  Timson denies any redness, bulging or heat at the area.  She is aware RB is 3pm ELINK and is okay with a call back later today.  She will be at work until 4:30-5pm so may leave message.  Dr Lamonte Sakai please advise, thank you.

## 2013-06-01 NOTE — Telephone Encounter (Signed)
LMTC x 1 for Ronald [name]

## 2013-06-04 DIAGNOSIS — R269 Unspecified abnormalities of gait and mobility: Secondary | ICD-10-CM | POA: Diagnosis not present

## 2013-06-04 DIAGNOSIS — S72009D Fracture of unspecified part of neck of unspecified femur, subsequent encounter for closed fracture with routine healing: Secondary | ICD-10-CM | POA: Diagnosis not present

## 2013-06-04 DIAGNOSIS — E119 Type 2 diabetes mellitus without complications: Secondary | ICD-10-CM | POA: Diagnosis not present

## 2013-06-04 DIAGNOSIS — Z5181 Encounter for therapeutic drug level monitoring: Secondary | ICD-10-CM | POA: Diagnosis not present

## 2013-06-04 DIAGNOSIS — I5032 Chronic diastolic (congestive) heart failure: Secondary | ICD-10-CM | POA: Diagnosis not present

## 2013-06-04 DIAGNOSIS — IMO0001 Reserved for inherently not codable concepts without codable children: Secondary | ICD-10-CM | POA: Diagnosis not present

## 2013-06-07 DIAGNOSIS — I5032 Chronic diastolic (congestive) heart failure: Secondary | ICD-10-CM | POA: Diagnosis not present

## 2013-06-07 DIAGNOSIS — S72009D Fracture of unspecified part of neck of unspecified femur, subsequent encounter for closed fracture with routine healing: Secondary | ICD-10-CM | POA: Diagnosis not present

## 2013-06-07 DIAGNOSIS — E119 Type 2 diabetes mellitus without complications: Secondary | ICD-10-CM | POA: Diagnosis not present

## 2013-06-07 DIAGNOSIS — Z5181 Encounter for therapeutic drug level monitoring: Secondary | ICD-10-CM | POA: Diagnosis not present

## 2013-06-07 DIAGNOSIS — IMO0001 Reserved for inherently not codable concepts without codable children: Secondary | ICD-10-CM | POA: Diagnosis not present

## 2013-06-07 DIAGNOSIS — R269 Unspecified abnormalities of gait and mobility: Secondary | ICD-10-CM | POA: Diagnosis not present

## 2013-06-07 NOTE — Telephone Encounter (Signed)
I don't see anything on the CT scan to correlate with a soft tissue injury or to explain the pain. If it continues then needs to be evaluated either at PCP or here.

## 2013-06-07 NOTE — Telephone Encounter (Signed)
Spoke with Kennyth Lose and notified of recs per RB She verbalized understanding  Nothing further needed

## 2013-06-08 ENCOUNTER — Ambulatory Visit: Payer: Medicare Other | Admitting: Cardiovascular Disease

## 2013-06-08 ENCOUNTER — Telehealth: Payer: Self-pay | Admitting: *Deleted

## 2013-06-08 ENCOUNTER — Ambulatory Visit (INDEPENDENT_AMBULATORY_CARE_PROVIDER_SITE_OTHER): Payer: Medicare Other | Admitting: Pharmacist Clinician (PhC)/ Clinical Pharmacy Specialist

## 2013-06-08 DIAGNOSIS — E119 Type 2 diabetes mellitus without complications: Secondary | ICD-10-CM | POA: Diagnosis not present

## 2013-06-08 DIAGNOSIS — R269 Unspecified abnormalities of gait and mobility: Secondary | ICD-10-CM | POA: Diagnosis not present

## 2013-06-08 DIAGNOSIS — IMO0001 Reserved for inherently not codable concepts without codable children: Secondary | ICD-10-CM | POA: Diagnosis not present

## 2013-06-08 DIAGNOSIS — I4819 Other persistent atrial fibrillation: Secondary | ICD-10-CM

## 2013-06-08 DIAGNOSIS — I4891 Unspecified atrial fibrillation: Secondary | ICD-10-CM

## 2013-06-08 DIAGNOSIS — I5032 Chronic diastolic (congestive) heart failure: Secondary | ICD-10-CM | POA: Diagnosis not present

## 2013-06-08 DIAGNOSIS — S72009D Fracture of unspecified part of neck of unspecified femur, subsequent encounter for closed fracture with routine healing: Secondary | ICD-10-CM | POA: Diagnosis not present

## 2013-06-08 DIAGNOSIS — Z5181 Encounter for therapeutic drug level monitoring: Secondary | ICD-10-CM | POA: Diagnosis not present

## 2013-06-08 LAB — POCT INR: INR: 2.2

## 2013-06-08 NOTE — Telephone Encounter (Signed)
Ronald [name] was calling in [name]' PT/INR  INR 2.2 PT 26.1 Still on coumadin 2.5 mg qd.   JB

## 2013-06-09 DIAGNOSIS — R269 Unspecified abnormalities of gait and mobility: Secondary | ICD-10-CM | POA: Diagnosis not present

## 2013-06-09 DIAGNOSIS — S72009D Fracture of unspecified part of neck of unspecified femur, subsequent encounter for closed fracture with routine healing: Secondary | ICD-10-CM | POA: Diagnosis not present

## 2013-06-09 DIAGNOSIS — I5032 Chronic diastolic (congestive) heart failure: Secondary | ICD-10-CM | POA: Diagnosis not present

## 2013-06-09 DIAGNOSIS — E119 Type 2 diabetes mellitus without complications: Secondary | ICD-10-CM | POA: Diagnosis not present

## 2013-06-09 DIAGNOSIS — Z5181 Encounter for therapeutic drug level monitoring: Secondary | ICD-10-CM | POA: Diagnosis not present

## 2013-06-09 DIAGNOSIS — IMO0001 Reserved for inherently not codable concepts without codable children: Secondary | ICD-10-CM | POA: Diagnosis not present

## 2013-06-12 ENCOUNTER — Emergency Department (INDEPENDENT_AMBULATORY_CARE_PROVIDER_SITE_OTHER)
Admission: EM | Admit: 2013-06-12 | Discharge: 2013-06-12 | Disposition: A | Discharge status: Home / Self Care | Payer: Medicare Other | Source: Home / Self Care

## 2013-06-12 ENCOUNTER — Encounter (HOSPITAL_COMMUNITY): Payer: Self-pay | Admitting: Emergency Medicine

## 2013-06-12 ENCOUNTER — Emergency Department (INDEPENDENT_AMBULATORY_CARE_PROVIDER_SITE_OTHER): Payer: Medicare Other

## 2013-06-12 DIAGNOSIS — S52599A Other fractures of lower end of unspecified radius, initial encounter for closed fracture: Secondary | ICD-10-CM | POA: Diagnosis not present

## 2013-06-12 DIAGNOSIS — E119 Type 2 diabetes mellitus without complications: Secondary | ICD-10-CM | POA: Diagnosis not present

## 2013-06-12 DIAGNOSIS — W230XXA Caught, crushed, jammed, or pinched between moving objects, initial encounter: Secondary | ICD-10-CM

## 2013-06-12 DIAGNOSIS — S52502A Unspecified fracture of the lower end of left radius, initial encounter for closed fracture: Secondary | ICD-10-CM

## 2013-06-12 LAB — GLUCOSE, CAPILLARY: Glucose-Capillary: 110 mg/dL — ABNORMAL HIGH (ref 70–99)

## 2013-06-12 NOTE — ED Notes (Signed)
Pt reports he fell off a trailer onto concrete/asphalt Inj right wrist; swelling and painful Abrasion to right side of face/cheek and right knee Denies LOC Taking Warfarin daily Alert w/no signs of acute distress.

## 2013-06-12 NOTE — ED Notes (Signed)
Pt sustained laceration to to Anterior Cubital area while [employer] placing splint Per Shanon Brow, cleaned and [facility] placed Xeroform, 4x4 gauze and secured w/4 inch kling

## 2013-06-12 NOTE — ED Provider Notes (Signed)
CSN: [id number]     Arrival date & time 06/12/13  1705 History   First MD Initiated Contact with Patient 06/12/13 1805     Chief Complaint  Patient presents with  . Fall   (Consider location/radiation/quality/duration/timing/severity/associated sxs/prior Treatment) HPI Comments: This 80 year old male was ascending a ramp. When he got to the end of the ramp he is left foot got stuck and he experienced a mechanical fall. He denies striking his head or injuring his neck. His only complaint is that of pain in the right wrist with mild swelling, abrasion to the right zygoma and superficial abrasion to the right knee. He is awake, alert, oriented with no apparent change in cognition or memory since the accident. Partial history from his significant other.   Past Medical History  Diagnosis Date  . AS (aortic stenosis)     bovine aortic valve replacement 01/07/11  . HTN (hypertension)   . Thrombocytopenia due to drugs     seen by Dr Inda Merlin plts [id number] no rx  . Peripheral vascular disease very poor circulation legs and feet ... stents right and left legs... done in dr j. Gwenlyn Found 's office.   . Depression wife died 4 years ago.    Marland Kitchen COPD (chronic obstructive pulmonary disease)   . Shortness of breath   . Recurrent upper respiratory infection (URI)     sinusitis  . Claudication in peripheral vascular disease 06/17/2011  . S/P angioplasty with stent, diamond back rotational athrectomy Prox. Rt. SFA 06/17/2011 06/17/2011  . Neuropathy due to secondary diabetes   . Type II diabetes mellitus   . Pneumonia 07/25/11    left  . Persistent atrial fibrillation   . Blood transfusion     w/hip operation  . History of stomach ulcers ~ 1951  . Renal artery stenosis 2006    renal artery stent  . Bladder cancer     Bladder Cancer local  . Cellulitis of left lower extremity   . Chronic diastolic congestive heart failure   . Normal coronary arteries Sept 2012   Past Surgical History  Procedure Laterality  Date  . Peripheral arterial stent graft      2006 left anf right illiac stents Dr Deon Pilling  . Aortic valve replacement  01/07/2011    Procedure: AORTIC VALVE REPLACEMENT (AVR);  Surgeon: Grace Isaac, MD;  Location: [name];  Service: Open Heart Surgery;  Laterality: N/A;; magna-ease bovine 29mm bioprosthesis  . Cardioversion  04/03/2011    Procedure: CARDIOVERSION;  Surgeon: Leonie Man, MD;  Location: [facility];  Service: Cardiovascular;  Laterality: N/A;  . Lower extremity angiogram  06/17/2011    diamondback orbital rotational and cutting balloon atherectomy of the prox R SFA  . Femur im nail  06/27/2011    Procedure: INTRAMEDULLARY (IM) NAIL FEMORAL;  Surgeon: Marin Shutter, MD;  Location: [facility];  Service: Orthopedics;  Laterality: Right;  . Tonsillectomy and adenoidectomy      "when I was a kid"  . Cholecystectomy    . Cataract extraction w/ intraocular lens  implant, bilateral  ~ 2007  . Renal artery stent  2006    "I believe"  . Cardiac catheterization  11/19/10    normal coronaries, mod AS, 75% l RAS   Family History  Problem Relation Age of Onset  . Heart disease Mother   . Cancer Brother     colon  . Stroke Father    History  Substance Use Topics  . Smoking status: Current  Every Day Smoker -- 2.00 packs/day for 75 years    Types: Cigarettes  . Smokeless tobacco: Never Used  . Alcohol Use: 1.2 oz/week    2 Cans of beer per week    Review of Systems  Constitutional: Negative.  Negative for activity change.  HENT: Negative for dental problem and facial swelling.   Respiratory: Negative.   Gastrointestinal: Negative.   Genitourinary: Negative.   Musculoskeletal: Positive for joint swelling. Negative for back pain, neck pain and neck stiffness.       As per HPI  Skin: Positive for wound.  Neurological: Negative for dizziness, weakness, numbness and headaches.    Allergies  Aspirin  Home Medications   Prior to Admission medications   Medication Sig Start Date  End Date Taking? Authorizing Provider  alendronate (FOSAMAX) 70 MG tablet Take 70 mg by mouth every 14 (fourteen) days. Take with a full glass of water on an empty stomach.    Historical Provider, MD  benazepril (LOTENSIN) 10 MG tablet Take 10 mg by mouth daily.    Historical Provider, MD  cyclobenzaprine (FEXMID) 7.5 MG tablet Take 1 tablet (7.5 mg total) by mouth 3 (three) times daily as needed for muscle spasms. 04/15/13   Donne Hazel, MD  doxazosin (CARDURA) 4 MG tablet Take 4 mg by mouth daily.     Coolidge Breeze, PA-C  finasteride (PROSCAR) 5 MG tablet Take 5 mg by mouth daily.      Historical Provider, MD  furosemide (LASIX) 40 MG tablet Take 1 tablet (40 mg total) by mouth 2 (two) times daily. 07/08/12   Tarri Fuller, PA-C  glyBURIDE (DIABETA) 5 MG tablet Take 2.5-10 mg by mouth 2 (two) times daily with a meal. 2 tablets in the morning and 1/2 tablet in the evening    Historical Provider, MD  HYDROcodone-acetaminophen (NORCO/VICODIN) 5-325 MG per tablet Take 1-2 tablets by mouth every 6 (six) hours as needed for moderate pain. 04/15/13   Donne Hazel, MD  insulin glargine (LANTUS) 100 UNIT/ML injection Inject 19 Units into the skin every evening.  07/29/11   Precious Reel, MD  loratadine (CLARITIN) 10 MG tablet Take 10 mg by mouth daily.    Historical Provider, MD  metoprolol (LOPRESSOR) 50 MG tablet Take 50 mg by mouth 2 (two) times daily.    Historical Provider, MD  potassium chloride SA (K-DUR,KLOR-CON) 20 MEQ tablet Take 20 mEq by mouth daily.    Historical Provider, MD  simvastatin (ZOCOR) 20 MG tablet Take 20 mg by mouth every evening.    Historical Provider, MD  traMADol (ULTRAM) 50 MG tablet Take 1 tablet (50 mg total) by mouth 2 (two) times daily. 04/15/13   Donne Hazel, MD  Umeclidinium-Vilanterol [facility] ELLIPTA) 62.5-25 MCG/INH AEPB Inhale 1 puff into the lungs daily.    Historical Provider, MD  warfarin (COUMADIN) 5 MG tablet Take 2.5 mg by mouth every evening.    Historical  Provider, MD   BP 164/66  Temp(Src) 98.5 F (36.9 C) (Oral)  Resp 18  SpO2 100% Physical Exam  Nursing note and vitals reviewed. Constitutional: He is oriented to person, place, and time. He appears well-developed and well-nourished. No distress.  HENT:  Head: Normocephalic and atraumatic.  Mouth/Throat: Oropharynx is clear and moist. No oropharyngeal exudate.  Bilateral TMs normal, no hemotympanums. Oropharynx is clear, no bleeding or evidence of trauma.  Eyes: Conjunctivae and EOM are normal. Pupils are equal, round, and reactive to light.  Neck: Normal  range of motion. Neck supple.  Cardiovascular: Normal rate and normal heart sounds.   Pulmonary/Chest: Effort normal. No respiratory distress.  Musculoskeletal:  Right wrist with tenderness and mild swelling. No deformity. Distal neurovascular motor status intact. Flexion and extension is limited due to pain.  Lymphadenopathy:    He has no cervical adenopathy.  Neurological: He is alert and oriented to person, place, and time. He exhibits normal muscle tone.  Skin: Skin is warm and dry.  Superficial abrasion to the right zygoma, right knee. Several areas of old ecchymosis of the extremities secondary to polyps and bruises. Is currently taking Coumadin.  Psychiatric: He has a normal mood and affect.    ED Course  Procedures (including critical care time) Labs Review Labs Reviewed  GLUCOSE, CAPILLARY - Abnormal; Notable for the following:    Glucose-Capillary 110 (*)    All other components within normal limits    Results for orders placed during the hospital encounter of 06/12/13  GLUCOSE, CAPILLARY      Result Value Ref Range   Glucose-Capillary 110 (*) 70 - 99 mg/dL   Imaging Review Dg Wrist Complete Right  06/12/2013   CLINICAL DATA:  Fall.  Right wrist pain and swelling.  EXAM: RIGHT WRIST - COMPLETE 3+ VIEW  COMPARISON:  None.  FINDINGS: Comminuted intra-articular fracture is seen involving the distal radial  metaphysis, which involves both the distal radial ulnar and radiocarpal joints. There is mild dorsal angulation of the distal articular surface of the radius.  No other fractures are identified.  No evidence of dislocation.  IMPRESSION: Comminuted intra-articular fracture of the distal radial metaphysis, with mild dorsal angulation.   Electronically Signed   By: Earle Gell M.D.   On: 06/12/2013 19:01     MDM   1. Distal radius fracture, left      sugartong splint Sling Take your tramadol for pain Elevate While preparing the immobilization appliction the splinter accidentally cut the pt's arm with scissors producing superficial skin tear.  Clean with NS and vaseline gauze F/U with ortho, call Monday for appt  Janne Napoleon, NP 06/12/13 2028

## 2013-06-12 NOTE — Discharge Instructions (Signed)
Cast or Splint Care °Casts and splints support injured limbs and keep bones from moving while they heal. It is important to care for your cast or splint at home.   °HOME CARE INSTRUCTIONS °· Keep the cast or splint uncovered during the drying period. It can take 24 to 48 hours to dry if it is made of plaster. A fiberglass cast will dry in less than 1 hour. °· Do not rest the cast on anything harder than a pillow for the first 24 hours. °· Do not put weight on your injured limb or apply pressure to the cast until your health care provider gives you permission. °· Keep the cast or splint dry. Wet casts or splints can lose their shape and may not support the limb as well. A wet cast that has lost its shape can also create harmful pressure on your skin when it dries. Also, wet skin can become infected. °· Cover the cast or splint with a plastic bag when bathing or when out in the rain or snow. If the cast is on the trunk of the body, take sponge baths until the cast is removed. °· If your cast does become wet, dry it with a towel or a blow dryer on the cool setting only. °· Keep your cast or splint clean. Soiled casts may be wiped with a moistened cloth. °· Do not place any hard or soft foreign objects under your cast or splint, such as cotton, toilet paper, lotion, or powder. °· Do not try to scratch the skin under the cast with any object. The object could get stuck inside the cast. Also, scratching could lead to an infection. If itching is a problem, use a blow dryer on a cool setting to relieve discomfort. °· Do not trim or cut your cast or remove padding from inside of it. °· Exercise all joints next to the injury that are not immobilized by the cast or splint. For example, if you have a long leg cast, exercise the hip joint and toes. If you have an arm cast or splint, exercise the shoulder, elbow, thumb, and fingers. °· Elevate your injured arm or leg on 1 or 2 pillows for the first 1 to 3 days to decrease  swelling and pain. It is best if you can comfortably elevate your cast so it is higher than your heart. °SEEK MEDICAL CARE IF:  °· Your cast or splint cracks. °· Your cast or splint is too tight or too loose. °· You have unbearable itching inside the cast. °· Your cast becomes wet or develops a soft spot or area. °· You have a bad smell coming from inside your cast. °· You get an object stuck under your cast. °· Your skin around the cast becomes red or raw. °· You have new pain or worsening pain after the cast has been applied. °SEEK IMMEDIATE MEDICAL CARE IF:  °· You have fluid leaking through the cast. °· You are unable to move your fingers or toes. °· You have discolored (blue or white), cool, painful, or very swollen fingers or toes beyond the cast. °· You have tingling or numbness around the injured area. °· You have severe pain or pressure under the cast. °· You have any difficulty with your breathing or have shortness of breath. °· You have chest pain. °Document Released: 02/09/2000 Document Revised: 12/02/2012 Document Reviewed: 08/20/2012 °[employer]® Patient Information ©2014 [employer]. ° °Radial Fracture °You have a broken bone (fracture) of the forearm. This is the   part of your arm between the elbow and your wrist. Your forearm is made up of two bones. These are the radius and ulna. Your fracture is in the radial shaft. This is the bone in your forearm located on the thumb side. A cast or splint is used to protect and keep your injured bone from moving. The cast or splint will be on generally for about 5 to 6 weeks, with individual variations. HOME CARE INSTRUCTIONS   Keep the injured part elevated while sitting or lying down. Keep the injury above the level of your heart (the center of the chest). This will decrease swelling and pain.  Apply ice to the injury for 15-20 minutes, 03-04 times per day while awake, for 2 days. Put the ice in a plastic bag and place a towel between the bag of ice and  your cast or splint.  Move your fingers to avoid stiffness and minimize swelling.  If you have a plaster or fiberglass cast:  Do not try to scratch the skin under the cast using sharp or pointed objects.  Check the skin around the cast every day. You may put lotion on any red or sore areas.  Keep your cast dry and clean.  If you have a plaster splint:  Wear the splint as directed.  You may loosen the elastic around the splint if your fingers become numb, tingle, or turn cold or blue.  Do not put pressure on any part of your cast or splint. It may break. Rest your cast only on a pillow for the first 24 hours until it is fully hardened.  Your cast or splint can be protected during bathing with a plastic bag. Do not lower the cast or splint into water.  Only take over-the-counter or prescription medicines for pain, discomfort, or fever as directed by your caregiver. SEEK IMMEDIATE MEDICAL CARE IF:   Your cast gets damaged or breaks.  You have more severe pain or swelling than you did before getting the cast.  You have severe pain when stretching your fingers.  There is a bad smell, new stains and/or pus-like (purulent) drainage coming from under the cast.  Your fingers or hand turn pale or blue and become cold or your loose feeling. Document Released: 07/25/2005 Document Revised: 05/06/2011 Document Reviewed: 10/21/2005 [facility] Patient Information 2014 [place].  Salter-Harris Fractures, Upper Extremities Salter-Harris fractures are breaks through or near a growth plate of growing children. Growth plates are at the ends of the bones. Physis is the medical name of the growth plate. This is one part of the bone that is needed for bone growth. How this injury is classified is important. It affects the treatment. It also provides clues to possible long-term results. Growth plate fractures are closely managed to make sure your child has adequate bone growth during and after the  healing of this injury. Following these injuries, bones may grow more slowly, normally, or even more quickly than they should. Usually the growth plates close during the teenage years. After closure they are no longer a consideration in treatment. SYMPTOMS  Symptoms may include pain, swelling, inability to bend the joint, deformity of the joint and inability to move an injured limb properly. DIAGNOSIS  These injuries are usually diagnosed with X-rays. Sometimes special X-rays such as a CT scan are needed to determine the amount of damage and further decide on the treatment. If another study is performed, its purpose is to determine the appropriate treatment and to help  in surgical planning. The more common types ofSalter-Harris fractures include the following:   Type 1: A type 1 fracture is a fracture across the growth plate. In this injury, the width of the growth plate is increased. Usually the growth zone of the growth plate is not injured. Growth disturbances are uncommon.  Type 2: A type 2 fracture is a fracture through the growth plate and the bone above it. The bone below it next to the joint is not involved. These fractures may shorten the bone during future growth. These injuries rarely result in future problems. This is the most common type of Salter-Harris fracture.  Type 3: A type 3 fracture is a fracture through the growth plate and the bone below it next to the joint. This break damages the growing layer of the growth plate. This break may cause long lasting joint problems. This is because it goes into the cartilage surface of the bone. They seldom cause much deformity so they have a relatively good cosmetic outcome. A Salter-Harris type 3 fracture of the ankle is likely to cause disability. The treatment for this fracture is often surgical. TREATMENT   The affected joint is usually splinted for the first couple of days to allow for swelling. A cast is put on after the swelling is down.  Sometimes a cast is put on right away with the sides of the cast cut to allow the joint to swell. If the bones are in place, this may be all that is needed.  If the bones are out of place, medications for pain are given to allow them to be put back in place. If they are seriously out of place, surgery may be needed to hold the pieces or breaks in place using wires, pins, screws or metal plates.  Generally most fractures will heal in 4 to 6 weeks. HOME CARE INSTRUCTIONS  To lessen swelling, the injured joint should be elevated while the child is sitting or lying down.  Place ice over the cast or splint on the injured area for 15 to 20 minutes four times per day during your child's waking hours. Put the ice in a plastic bagand place a thin towel between the bag of ice and the cast.  If your child has a plaster or fiberglass cast:  They should not try to scratch the skin under the cast using sharp or pointed objects.  Check the skin around the cast every day. Put lotion on any red or sore areas.  Have your child keep the cast dry and clean.  If your child has a plaster splint:  Your child should wear the splint as directed.  You may loosen the elastic around the splint if your child's fingers become numb, tingle, or turn cold or blue.  Do not put pressure on any part of your child's cast or splint. It may break. Rest your child's cast only on a pillow the first 24 hours until it is fully hardened.  Your child's cast or splint can be protected during bathing with a plastic bag. Do not lower the cast or splint into water.  Only give your child over-the-counter or prescription medicines for pain, discomfort, or fever as directed by your caregiver.  See your child's caregiver if the cast gets damaged or breaks.  Follow all instructions for follow up with your child's caregiver. This includes any orthopedic referrals, physical therapy and rehabilitation. Any delay in obtaining necessary care  could result in a delay or failure of  the bones to heal. SEEK IMMEDIATE MEDICAL CARE IF:  Your child has continued severe pain or more swelling than they did before the cast was put on.  Their skin or fingernails below the injury turn blue or gray or feel cold or numb.  There is drainage coming from under the cast.  Problems develop that you are worried about. It is very important that you participate in your child's return to normal health. Any delay in seeking treatment if the above conditions occur may result in serious and permanent injury to the affected area.  Document Released: 12/27/2005 Document Revised: 08/13/2011 Document Reviewed: 01/27/2007 [facility] Patient Information 2014 [place].

## 2013-06-12 NOTE — Progress Notes (Signed)
Orthopedic Tech Progress Note Patient Details:  Ronald [name] [birth year] [id number]  Ortho Devices Type of Ortho Device: Ace wrap;Sugartong splint;Arm sling Ortho Device/Splint Location: RUE Ortho Device/Splint Interventions: Ordered;Application   Braulio Bosch 06/12/2013, 8:11 PM

## 2013-06-14 DIAGNOSIS — M25539 Pain in unspecified wrist: Secondary | ICD-10-CM | POA: Diagnosis not present

## 2013-06-14 DIAGNOSIS — M25439 Effusion, unspecified wrist: Secondary | ICD-10-CM | POA: Diagnosis not present

## 2013-06-14 DIAGNOSIS — S52599A Other fractures of lower end of unspecified radius, initial encounter for closed fracture: Secondary | ICD-10-CM | POA: Diagnosis not present

## 2013-06-14 NOTE — ED Provider Notes (Signed)
Medical screening examination/treatment/procedure(s) were performed by a resident physician or non-physician practitioner and as the supervising physician I was immediately available for consultation/collaboration.  Leane Loring, MD    Zale Marcotte S Arnitra Sokoloski, MD 06/14/13 0751 

## 2013-06-17 ENCOUNTER — Other Ambulatory Visit: Payer: Self-pay | Admitting: Adult Health

## 2013-06-21 DIAGNOSIS — M25539 Pain in unspecified wrist: Secondary | ICD-10-CM | POA: Diagnosis not present

## 2013-06-21 DIAGNOSIS — S52599A Other fractures of lower end of unspecified radius, initial encounter for closed fracture: Secondary | ICD-10-CM | POA: Diagnosis not present

## 2013-07-05 ENCOUNTER — Ambulatory Visit (INDEPENDENT_AMBULATORY_CARE_PROVIDER_SITE_OTHER): Payer: Medicare Other | Admitting: Pharmacist Clinician (PhC)/ Clinical Pharmacy Specialist

## 2013-07-05 DIAGNOSIS — M25439 Effusion, unspecified wrist: Secondary | ICD-10-CM | POA: Diagnosis not present

## 2013-07-05 DIAGNOSIS — I4819 Other persistent atrial fibrillation: Secondary | ICD-10-CM

## 2013-07-05 DIAGNOSIS — I4891 Unspecified atrial fibrillation: Secondary | ICD-10-CM

## 2013-07-05 DIAGNOSIS — M25539 Pain in unspecified wrist: Secondary | ICD-10-CM | POA: Diagnosis not present

## 2013-07-05 DIAGNOSIS — Z7901 Long term (current) use of anticoagulants: Secondary | ICD-10-CM

## 2013-07-05 DIAGNOSIS — S5290XD Unspecified fracture of unspecified forearm, subsequent encounter for closed fracture with routine healing: Secondary | ICD-10-CM | POA: Diagnosis not present

## 2013-07-05 LAB — POCT INR: INR: 2

## 2013-07-09 ENCOUNTER — Other Ambulatory Visit (HOSPITAL_COMMUNITY): Payer: Self-pay | Admitting: Cardiovascular Disease

## 2013-07-09 DIAGNOSIS — I6529 Occlusion and stenosis of unspecified carotid artery: Secondary | ICD-10-CM

## 2013-07-13 ENCOUNTER — Encounter (HOSPITAL_COMMUNITY): Payer: Medicare Other

## 2013-07-13 ENCOUNTER — Telehealth (HOSPITAL_COMMUNITY): Payer: Self-pay | Admitting: *Deleted

## 2013-07-16 DIAGNOSIS — I509 Heart failure, unspecified: Secondary | ICD-10-CM | POA: Diagnosis not present

## 2013-07-16 DIAGNOSIS — R269 Unspecified abnormalities of gait and mobility: Secondary | ICD-10-CM | POA: Diagnosis not present

## 2013-07-16 DIAGNOSIS — N183 Chronic kidney disease, stage 3 unspecified: Secondary | ICD-10-CM | POA: Diagnosis not present

## 2013-07-16 DIAGNOSIS — N644 Mastodynia: Secondary | ICD-10-CM | POA: Diagnosis not present

## 2013-07-16 DIAGNOSIS — M810 Age-related osteoporosis without current pathological fracture: Secondary | ICD-10-CM | POA: Diagnosis not present

## 2013-07-16 DIAGNOSIS — Z7901 Long term (current) use of anticoagulants: Secondary | ICD-10-CM | POA: Diagnosis not present

## 2013-07-16 DIAGNOSIS — I4891 Unspecified atrial fibrillation: Secondary | ICD-10-CM | POA: Diagnosis not present

## 2013-07-16 DIAGNOSIS — I2510 Atherosclerotic heart disease of native coronary artery without angina pectoris: Secondary | ICD-10-CM | POA: Diagnosis not present

## 2013-07-18 ENCOUNTER — Other Ambulatory Visit: Payer: Self-pay | Admitting: Adult Health

## 2013-07-20 ENCOUNTER — Other Ambulatory Visit: Payer: Self-pay | Admitting: Internal Medicine

## 2013-07-20 DIAGNOSIS — N644 Mastodynia: Secondary | ICD-10-CM

## 2013-07-20 DIAGNOSIS — M25639 Stiffness of unspecified wrist, not elsewhere classified: Secondary | ICD-10-CM | POA: Diagnosis not present

## 2013-07-20 DIAGNOSIS — M25539 Pain in unspecified wrist: Secondary | ICD-10-CM | POA: Diagnosis not present

## 2013-07-22 ENCOUNTER — Other Ambulatory Visit: Payer: Self-pay | Admitting: Adult Health

## 2013-07-27 ENCOUNTER — Ambulatory Visit
Admission: RE | Admit: 2013-07-27 | Discharge: 2013-07-27 | Disposition: A | Discharge status: Home / Self Care | Payer: Medicare Other | Source: Ambulatory Visit | Attending: Internal Medicine | Admitting: Internal Medicine

## 2013-07-27 DIAGNOSIS — N644 Mastodynia: Secondary | ICD-10-CM

## 2013-07-27 DIAGNOSIS — N62 Hypertrophy of breast: Secondary | ICD-10-CM | POA: Diagnosis not present

## 2013-07-28 DIAGNOSIS — M25539 Pain in unspecified wrist: Secondary | ICD-10-CM | POA: Diagnosis not present

## 2013-08-02 ENCOUNTER — Ambulatory Visit (HOSPITAL_COMMUNITY)
Admission: RE | Admit: 2013-08-02 | Discharge: 2013-08-02 | Disposition: A | Discharge status: Home / Self Care | Payer: Medicare Other | Source: Ambulatory Visit | Attending: Cardiovascular Disease | Admitting: Cardiovascular Disease

## 2013-08-02 ENCOUNTER — Ambulatory Visit (INDEPENDENT_AMBULATORY_CARE_PROVIDER_SITE_OTHER): Payer: Medicare Other | Admitting: Pharmacist Clinician (PhC)/ Clinical Pharmacy Specialist

## 2013-08-02 DIAGNOSIS — I6529 Occlusion and stenosis of unspecified carotid artery: Secondary | ICD-10-CM | POA: Diagnosis not present

## 2013-08-02 DIAGNOSIS — I4891 Unspecified atrial fibrillation: Secondary | ICD-10-CM

## 2013-08-02 DIAGNOSIS — I4819 Other persistent atrial fibrillation: Secondary | ICD-10-CM

## 2013-08-02 LAB — POCT INR: INR: 1.9

## 2013-08-02 NOTE — Progress Notes (Signed)
Carotid Duplex Completed. Tommie Bohlken, BS, RDMS, RVT  

## 2013-08-05 DIAGNOSIS — M25539 Pain in unspecified wrist: Secondary | ICD-10-CM | POA: Diagnosis not present

## 2013-08-12 DIAGNOSIS — M25539 Pain in unspecified wrist: Secondary | ICD-10-CM | POA: Diagnosis not present

## 2013-08-16 DIAGNOSIS — M25539 Pain in unspecified wrist: Secondary | ICD-10-CM | POA: Diagnosis not present

## 2013-08-16 DIAGNOSIS — M25639 Stiffness of unspecified wrist, not elsewhere classified: Secondary | ICD-10-CM | POA: Diagnosis not present

## 2013-08-16 DIAGNOSIS — S5290XD Unspecified fracture of unspecified forearm, subsequent encounter for closed fracture with routine healing: Secondary | ICD-10-CM | POA: Diagnosis not present

## 2013-08-18 DIAGNOSIS — M25639 Stiffness of unspecified wrist, not elsewhere classified: Secondary | ICD-10-CM | POA: Diagnosis not present

## 2013-08-27 ENCOUNTER — Emergency Department (HOSPITAL_COMMUNITY)
Admission: EM | Admit: 2013-08-27 | Discharge: 2013-08-27 | Disposition: A | Discharge status: Home / Self Care | Payer: Medicare Other | Attending: Emergency Medicine | Admitting: Emergency Medicine

## 2013-08-27 ENCOUNTER — Encounter (HOSPITAL_COMMUNITY): Payer: Self-pay | Admitting: Emergency Medicine

## 2013-08-27 ENCOUNTER — Emergency Department (HOSPITAL_COMMUNITY): Payer: Medicare Other

## 2013-08-27 DIAGNOSIS — Z85528 Personal history of other malignant neoplasm of kidney: Secondary | ICD-10-CM | POA: Diagnosis not present

## 2013-08-27 DIAGNOSIS — I4891 Unspecified atrial fibrillation: Secondary | ICD-10-CM | POA: Insufficient documentation

## 2013-08-27 DIAGNOSIS — E1142 Type 2 diabetes mellitus with diabetic polyneuropathy: Secondary | ICD-10-CM | POA: Insufficient documentation

## 2013-08-27 DIAGNOSIS — Z79899 Other long term (current) drug therapy: Secondary | ICD-10-CM | POA: Insufficient documentation

## 2013-08-27 DIAGNOSIS — E119 Type 2 diabetes mellitus without complications: Secondary | ICD-10-CM | POA: Insufficient documentation

## 2013-08-27 DIAGNOSIS — Z9889 Other specified postprocedural states: Secondary | ICD-10-CM | POA: Insufficient documentation

## 2013-08-27 DIAGNOSIS — Z9861 Coronary angioplasty status: Secondary | ICD-10-CM | POA: Insufficient documentation

## 2013-08-27 DIAGNOSIS — Z8719 Personal history of other diseases of the digestive system: Secondary | ICD-10-CM | POA: Insufficient documentation

## 2013-08-27 DIAGNOSIS — F17200 Nicotine dependence, unspecified, uncomplicated: Secondary | ICD-10-CM | POA: Insufficient documentation

## 2013-08-27 DIAGNOSIS — J449 Chronic obstructive pulmonary disease, unspecified: Secondary | ICD-10-CM | POA: Insufficient documentation

## 2013-08-27 DIAGNOSIS — E1349 Other specified diabetes mellitus with other diabetic neurological complication: Secondary | ICD-10-CM | POA: Insufficient documentation

## 2013-08-27 DIAGNOSIS — J4489 Other specified chronic obstructive pulmonary disease: Secondary | ICD-10-CM | POA: Insufficient documentation

## 2013-08-27 DIAGNOSIS — F3289 Other specified depressive episodes: Secondary | ICD-10-CM | POA: Diagnosis not present

## 2013-08-27 DIAGNOSIS — Z872 Personal history of diseases of the skin and subcutaneous tissue: Secondary | ICD-10-CM | POA: Diagnosis not present

## 2013-08-27 DIAGNOSIS — F329 Major depressive disorder, single episode, unspecified: Secondary | ICD-10-CM | POA: Insufficient documentation

## 2013-08-27 DIAGNOSIS — Z8701 Personal history of pneumonia (recurrent): Secondary | ICD-10-CM | POA: Diagnosis not present

## 2013-08-27 DIAGNOSIS — I5032 Chronic diastolic (congestive) heart failure: Secondary | ICD-10-CM | POA: Insufficient documentation

## 2013-08-27 DIAGNOSIS — I10 Essential (primary) hypertension: Secondary | ICD-10-CM | POA: Diagnosis not present

## 2013-08-27 DIAGNOSIS — J438 Other emphysema: Secondary | ICD-10-CM | POA: Diagnosis not present

## 2013-08-27 DIAGNOSIS — Z7901 Long term (current) use of anticoagulants: Secondary | ICD-10-CM | POA: Diagnosis not present

## 2013-08-27 LAB — CBC
HCT: 38.8 % — ABNORMAL LOW (ref 39.0–52.0)
Hemoglobin: 13 g/dL (ref 13.0–17.0)
MCH: 31.3 pg (ref 26.0–34.0)
MCHC: 33.5 g/dL (ref 30.0–36.0)
MCV: 93.3 fL (ref 78.0–100.0)
Platelets: 81 10*3/uL — ABNORMAL LOW (ref 150–400)
RBC: 4.16 MIL/uL — AB (ref 4.22–5.81)
RDW: 14.8 % (ref 11.5–15.5)
WBC: 7.1 10*3/uL (ref 4.0–10.5)

## 2013-08-27 LAB — BASIC METABOLIC PANEL
Anion gap: 12 (ref 5–15)
BUN: 25 mg/dL — ABNORMAL HIGH (ref 6–23)
CALCIUM: 8.9 mg/dL (ref 8.4–10.5)
CO2: 26 meq/L (ref 19–32)
Chloride: 102 mEq/L (ref 96–112)
Creatinine, Ser: 1.29 mg/dL (ref 0.50–1.35)
GFR calc Af Amer: 55 mL/min — ABNORMAL LOW (ref >90)
GFR, EST NON AFRICAN AMERICAN: 47 mL/min — AB (ref >90)
Glucose, Bld: 204 mg/dL — ABNORMAL HIGH (ref 70–99)
Potassium: 3.8 mEq/L (ref 3.7–5.3)
SODIUM: 140 meq/L (ref 137–147)

## 2013-08-27 LAB — I-STAT TROPONIN, ED: Troponin i, poc: 0.01 ng/mL (ref 0.00–0.08)

## 2013-08-27 LAB — PRO B NATRIURETIC PEPTIDE: PRO B NATRI PEPTIDE: 1130 pg/mL — AB (ref 0–450)

## 2013-08-27 NOTE — ED Notes (Signed)
MD at bedside. 

## 2013-08-27 NOTE — ED Provider Notes (Signed)
CSN: [id number]     Arrival date & time 08/27/13  0907 History   First MD Initiated Contact with Patient 08/27/13 [id number]     Chief Complaint  Patient presents with  . Congestive Heart Failure      HPI Patient had a 3-4 pound weight gain this morning and was told of this occurred then he may become emerged room.  Patient denies shortness of breath or chest pain.  Denies edema.  He does take daily Lasix and has had history of congestive heart failure in the past.  Patient also has history of aortic stenosis and had a valve replacement 2 years ago. Past Medical History  Diagnosis Date  . AS (aortic stenosis)     bovine aortic valve replacement 01/07/11  . HTN (hypertension)   . Thrombocytopenia due to drugs     seen by Dr Inda Merlin plts [id number] no rx  . Peripheral vascular disease very poor circulation legs and feet ... stents right and left legs... done in dr j. Gwenlyn Found 's office.   . Depression wife died 4 years ago.    Marland Kitchen COPD (chronic obstructive pulmonary disease)   . Shortness of breath   . Recurrent upper respiratory infection (URI)     sinusitis  . Claudication in peripheral vascular disease 06/17/2011  . S/P angioplasty with stent, diamond back rotational athrectomy Prox. Rt. SFA 06/17/2011 06/17/2011  . Neuropathy due to secondary diabetes   . Type II diabetes mellitus   . Pneumonia 07/25/11    left  . Persistent atrial fibrillation   . Blood transfusion     w/hip operation  . History of stomach ulcers ~ 1951  . Renal artery stenosis 2006    renal artery stent  . Bladder cancer     Bladder Cancer local  . Cellulitis of left lower extremity   . Chronic diastolic congestive heart failure   . Normal coronary arteries Sept 2012   Past Surgical History  Procedure Laterality Date  . Peripheral arterial stent graft      2006 left anf right illiac stents Dr Deon Pilling  . Aortic valve replacement  01/07/2011    Procedure: AORTIC VALVE REPLACEMENT (AVR);  Surgeon: Grace Isaac, MD;   Location: [place];  Service: Open Heart Surgery;  Laterality: N/A;; magna-ease bovine 23mm bioprosthesis  . Cardioversion  04/03/2011    Procedure: CARDIOVERSION;  Surgeon: Leonie Man, MD;  Location: [place];  Service: Cardiovascular;  Laterality: N/A;  . Lower extremity angiogram  06/17/2011    diamondback orbital rotational and cutting balloon atherectomy of the prox R SFA  . Femur im nail  06/27/2011    Procedure: INTRAMEDULLARY (IM) NAIL FEMORAL;  Surgeon: Marin Shutter, MD;  Location: [facility];  Service: Orthopedics;  Laterality: Right;  . Tonsillectomy and adenoidectomy      "when I was a kid"  . Cholecystectomy    . Cataract extraction w/ intraocular lens  implant, bilateral  ~ 2007  . Renal artery stent  2006    "I believe"  . Cardiac catheterization  11/19/10    normal coronaries, mod AS, 75% l RAS   Family History  Problem Relation Age of Onset  . Heart disease Mother   . Cancer Brother     colon  . Stroke Father    History  Substance Use Topics  . Smoking status: Current Every Day Smoker -- 2.00 packs/day for 75 years    Types: Cigarettes  . Smokeless tobacco: Never Used  .  Alcohol Use: 1.2 oz/week    2 Cans of beer per week    Review of Systems  All other systems reviewed and are negative  Allergies  Aspirin  Home Medications   Prior to Admission medications   Medication Sig Start Date End Date Taking? Authorizing Provider  alendronate (FOSAMAX) 70 MG tablet Take 70 mg by mouth every 14 (fourteen) days. Take with a full glass of water on an empty stomach.    Historical Provider, MD  benazepril (LOTENSIN) 10 MG tablet Take 10 mg by mouth daily.    Historical Provider, MD  cyclobenzaprine (FEXMID) 7.5 MG tablet Take 1 tablet (7.5 mg total) by mouth 3 (three) times daily as needed for muscle spasms. 04/15/13   Donne Hazel, MD  doxazosin (CARDURA) 4 MG tablet Take 4 mg by mouth daily.     Coolidge Breeze, PA-C  finasteride (PROSCAR) 5 MG tablet Take 5 mg by mouth  daily.      Historical Provider, MD  furosemide (LASIX) 40 MG tablet Take 1 tablet (40 mg total) by mouth 2 (two) times daily. 07/08/12   Tarri Fuller, PA-C  glyBURIDE (DIABETA) 5 MG tablet Take 2.5-10 mg by mouth 2 (two) times daily with a meal. 2 tablets in the morning and 1/2 tablet in the evening    Historical Provider, MD  HYDROcodone-acetaminophen (NORCO/VICODIN) 5-325 MG per tablet Take 1-2 tablets by mouth every 6 (six) hours as needed for moderate pain. 04/15/13   Donne Hazel, MD  insulin glargine (LANTUS) 100 UNIT/ML injection Inject 19 Units into the skin every evening.  07/29/11   Precious Reel, MD  loratadine (CLARITIN) 10 MG tablet Take 10 mg by mouth daily.    Historical Provider, MD  metoprolol (LOPRESSOR) 50 MG tablet Take 50 mg by mouth 2 (two) times daily.    Historical Provider, MD  potassium chloride SA (K-DUR,KLOR-CON) 20 MEQ tablet Take 20 mEq by mouth daily.    Historical Provider, MD  simvastatin (ZOCOR) 20 MG tablet Take 20 mg by mouth every evening.    Historical Provider, MD  traMADol (ULTRAM) 50 MG tablet Take 1 tablet (50 mg total) by mouth 2 (two) times daily. 04/15/13   Donne Hazel, MD  Umeclidinium-Vilanterol [facility] ELLIPTA) 62.5-25 MCG/INH AEPB Inhale 1 puff into the lungs daily.    Historical Provider, MD  warfarin (COUMADIN) 5 MG tablet Take 2.5 mg by mouth every evening.    Historical Provider, MD   BP 152/55  Pulse 58  Temp(Src) 97.4 F (36.3 C) (Oral)  Resp 20  SpO2 98% Physical Exam  Nursing note and vitals reviewed. Constitutional: He is oriented to person, place, and time. He appears well-developed and well-nourished. No distress.  HENT:  Head: Normocephalic and atraumatic.  Eyes: Pupils are equal, round, and reactive to light.  Neck: Normal range of motion. No JVD present.  Cardiovascular: Normal rate and intact distal pulses.   Pulmonary/Chest: No respiratory distress. He has no rales.  Abdominal: Normal appearance. He exhibits no distension.  There is no tenderness. There is no rebound.  Musculoskeletal: Normal range of motion. He exhibits no edema and no tenderness.  Neurological: He is alert and oriented to person, place, and time. No cranial nerve deficit.  Skin: Skin is warm and dry. No rash noted.  Psychiatric: He has a normal mood and affect. His behavior is normal.    ED Course  Procedures (including critical care time) Labs Review Labs Reviewed  CBC - Abnormal;  Notable for the following:    RBC 4.16 (*)    HCT 38.8 (*)    All other components within normal limits  BASIC METABOLIC PANEL - Abnormal; Notable for the following:    Glucose, Bld 204 (*)    BUN 25 (*)    GFR calc non Af Amer 47 (*)    GFR calc Af Amer 55 (*)    All other components within normal limits  PRO B NATRIURETIC PEPTIDE - Abnormal; Notable for the following:    Pro B Natriuretic peptide (BNP) 1130.0 (*)    All other components within normal limits  I-STAT TROPOININ, ED    Imaging Review Dg Chest 2 View  08/27/2013   CLINICAL DATA:  Congestive heart failure.  EXAM: CHEST  2 VIEW  COMPARISON:  04/14/2013.  FINDINGS: The heart is upper limits of normal in size and stable. There is mild tortuosity of the thoracic aorta. There are surgical changes from aortic valve replacement surgery. A left atrial appendage closure device is also noted. The lungs are clear of acute process. Stable mild emphysematous changes and pulmonary scarring. No pulmonary edema or pleural effusion. The bony thorax is intact. Remote thoracic compression fractures are noted.  IMPRESSION: No acute cardiopulmonary findings. Specifically, no findings to suggest CHF.  Chronic lung changes/emphysema.   Electronically Signed   By: Kalman Jewels M.D.   On: 08/27/2013 09:45     Date: 08/27/2013  Rate: 61  Rhythm: Atrial fibrillation  QRS Axis: normal  Intervals: normal  ST/T Wave abnormalities: normal  Conduction Disutrbances: none  Narrative Interpretation: Abnormal  EKG      MDM   Final diagnoses:  Chronic diastolic heart failure        Dot Lanes, MD 08/27/13 1058

## 2013-08-27 NOTE — ED Notes (Signed)
Per pt sts he has had some weight gain over the past day and is worried that he may be going to heart failure. Denies SOB, chest pain or edema.

## 2013-08-27 NOTE — Discharge Instructions (Signed)

## 2013-09-01 ENCOUNTER — Other Ambulatory Visit: Payer: Self-pay | Admitting: Adult Health

## 2013-09-13 ENCOUNTER — Ambulatory Visit (INDEPENDENT_AMBULATORY_CARE_PROVIDER_SITE_OTHER): Payer: Medicare Other | Admitting: Pharmacist Clinician (PhC)/ Clinical Pharmacy Specialist

## 2013-09-13 DIAGNOSIS — Z7901 Long term (current) use of anticoagulants: Secondary | ICD-10-CM

## 2013-09-13 DIAGNOSIS — I4819 Other persistent atrial fibrillation: Secondary | ICD-10-CM

## 2013-09-13 DIAGNOSIS — I4891 Unspecified atrial fibrillation: Secondary | ICD-10-CM

## 2013-09-13 LAB — POCT INR: INR: 2.1

## 2013-09-15 DIAGNOSIS — Z1331 Encounter for screening for depression: Secondary | ICD-10-CM | POA: Diagnosis not present

## 2013-09-15 DIAGNOSIS — F17200 Nicotine dependence, unspecified, uncomplicated: Secondary | ICD-10-CM | POA: Diagnosis not present

## 2013-09-15 DIAGNOSIS — I10 Essential (primary) hypertension: Secondary | ICD-10-CM | POA: Diagnosis not present

## 2013-09-15 DIAGNOSIS — N183 Chronic kidney disease, stage 3 unspecified: Secondary | ICD-10-CM | POA: Diagnosis not present

## 2013-09-15 DIAGNOSIS — I4891 Unspecified atrial fibrillation: Secondary | ICD-10-CM | POA: Diagnosis not present

## 2013-09-15 DIAGNOSIS — Z23 Encounter for immunization: Secondary | ICD-10-CM | POA: Diagnosis not present

## 2013-09-15 DIAGNOSIS — E1159 Type 2 diabetes mellitus with other circulatory complications: Secondary | ICD-10-CM | POA: Diagnosis not present

## 2013-09-15 DIAGNOSIS — I2510 Atherosclerotic heart disease of native coronary artery without angina pectoris: Secondary | ICD-10-CM | POA: Diagnosis not present

## 2013-09-15 DIAGNOSIS — Z7901 Long term (current) use of anticoagulants: Secondary | ICD-10-CM | POA: Diagnosis not present

## 2013-09-15 DIAGNOSIS — I509 Heart failure, unspecified: Secondary | ICD-10-CM | POA: Diagnosis not present

## 2013-09-21 ENCOUNTER — Other Ambulatory Visit: Payer: Self-pay | Admitting: Cardiovascular Disease

## 2013-09-30 DIAGNOSIS — I10 Essential (primary) hypertension: Secondary | ICD-10-CM | POA: Diagnosis not present

## 2013-10-20 DIAGNOSIS — I10 Essential (primary) hypertension: Secondary | ICD-10-CM | POA: Diagnosis not present

## 2013-10-20 DIAGNOSIS — Z7901 Long term (current) use of anticoagulants: Secondary | ICD-10-CM | POA: Diagnosis not present

## 2013-10-20 DIAGNOSIS — I739 Peripheral vascular disease, unspecified: Secondary | ICD-10-CM | POA: Diagnosis not present

## 2013-10-20 DIAGNOSIS — IMO0001 Reserved for inherently not codable concepts without codable children: Secondary | ICD-10-CM | POA: Diagnosis not present

## 2013-10-20 DIAGNOSIS — Z954 Presence of other heart-valve replacement: Secondary | ICD-10-CM | POA: Diagnosis not present

## 2013-10-25 ENCOUNTER — Ambulatory Visit: Payer: Medicare Other | Admitting: Pharmacist Clinician (PhC)/ Clinical Pharmacy Specialist

## 2013-10-28 DIAGNOSIS — L708 Other acne: Secondary | ICD-10-CM | POA: Diagnosis not present

## 2013-11-02 DIAGNOSIS — Z7901 Long term (current) use of anticoagulants: Secondary | ICD-10-CM | POA: Diagnosis not present

## 2013-11-11 ENCOUNTER — Other Ambulatory Visit: Payer: Self-pay | Admitting: Adult Health

## 2013-11-12 NOTE — Progress Notes (Signed)
This encounter was created in error - please disregard.

## 2013-11-16 DIAGNOSIS — N401 Enlarged prostate with lower urinary tract symptoms: Secondary | ICD-10-CM | POA: Diagnosis not present

## 2013-11-16 DIAGNOSIS — Z8551 Personal history of malignant neoplasm of bladder: Secondary | ICD-10-CM | POA: Diagnosis not present

## 2013-11-16 DIAGNOSIS — N139 Obstructive and reflux uropathy, unspecified: Secondary | ICD-10-CM | POA: Diagnosis not present

## 2013-11-22 DIAGNOSIS — I10 Essential (primary) hypertension: Secondary | ICD-10-CM | POA: Diagnosis not present

## 2013-11-22 DIAGNOSIS — Z23 Encounter for immunization: Secondary | ICD-10-CM | POA: Diagnosis not present

## 2013-11-22 DIAGNOSIS — I509 Heart failure, unspecified: Secondary | ICD-10-CM | POA: Diagnosis not present

## 2013-11-22 DIAGNOSIS — E1159 Type 2 diabetes mellitus with other circulatory complications: Secondary | ICD-10-CM | POA: Diagnosis not present

## 2013-11-22 DIAGNOSIS — N183 Chronic kidney disease, stage 3 unspecified: Secondary | ICD-10-CM | POA: Diagnosis not present

## 2013-11-22 DIAGNOSIS — D649 Anemia, unspecified: Secondary | ICD-10-CM | POA: Diagnosis not present

## 2013-11-22 DIAGNOSIS — Z7901 Long term (current) use of anticoagulants: Secondary | ICD-10-CM | POA: Diagnosis not present

## 2013-11-22 DIAGNOSIS — E785 Hyperlipidemia, unspecified: Secondary | ICD-10-CM | POA: Diagnosis not present

## 2013-11-22 DIAGNOSIS — I4891 Unspecified atrial fibrillation: Secondary | ICD-10-CM | POA: Diagnosis not present

## 2013-11-23 ENCOUNTER — Telehealth: Payer: Self-pay | Admitting: Emergency Medicine

## 2013-11-23 DIAGNOSIS — R918 Other nonspecific abnormal finding of lung field: Secondary | ICD-10-CM

## 2013-11-23 NOTE — Telephone Encounter (Signed)
Pt needs a CT chest without contrast in 04/2014 to follow nodules. I ordered today

## 2013-12-11 ENCOUNTER — Inpatient Hospital Stay (HOSPITAL_COMMUNITY)
Admission: EM | Admit: 2013-12-11 | Discharge: 2013-12-16 | DRG: 481 | Disposition: A | Discharge status: Skilled Nursing Facility | Payer: Medicare Other | Attending: Internal Medicine | Admitting: Internal Medicine

## 2013-12-11 ENCOUNTER — Emergency Department (HOSPITAL_COMMUNITY): Payer: Medicare Other

## 2013-12-11 ENCOUNTER — Encounter (HOSPITAL_COMMUNITY): Payer: Self-pay | Admitting: Emergency Medicine

## 2013-12-11 DIAGNOSIS — Z01818 Encounter for other preprocedural examination: Secondary | ICD-10-CM | POA: Diagnosis not present

## 2013-12-11 DIAGNOSIS — S72009A Fracture of unspecified part of neck of unspecified femur, initial encounter for closed fracture: Secondary | ICD-10-CM

## 2013-12-11 DIAGNOSIS — D62 Acute posthemorrhagic anemia: Secondary | ICD-10-CM | POA: Diagnosis not present

## 2013-12-11 DIAGNOSIS — J449 Chronic obstructive pulmonary disease, unspecified: Secondary | ICD-10-CM | POA: Diagnosis present

## 2013-12-11 DIAGNOSIS — F329 Major depressive disorder, single episode, unspecified: Secondary | ICD-10-CM | POA: Diagnosis present

## 2013-12-11 DIAGNOSIS — Z8711 Personal history of peptic ulcer disease: Secondary | ICD-10-CM | POA: Diagnosis not present

## 2013-12-11 DIAGNOSIS — Z809 Family history of malignant neoplasm, unspecified: Secondary | ICD-10-CM

## 2013-12-11 DIAGNOSIS — S72132A Displaced apophyseal fracture of left femur, initial encounter for closed fracture: Secondary | ICD-10-CM | POA: Diagnosis not present

## 2013-12-11 DIAGNOSIS — D696 Thrombocytopenia, unspecified: Secondary | ICD-10-CM | POA: Diagnosis present

## 2013-12-11 DIAGNOSIS — R0602 Shortness of breath: Secondary | ICD-10-CM | POA: Diagnosis not present

## 2013-12-11 DIAGNOSIS — Z886 Allergy status to analgesic agent status: Secondary | ICD-10-CM

## 2013-12-11 DIAGNOSIS — N400 Enlarged prostate without lower urinary tract symptoms: Secondary | ICD-10-CM | POA: Diagnosis not present

## 2013-12-11 DIAGNOSIS — Z953 Presence of xenogenic heart valve: Secondary | ICD-10-CM | POA: Diagnosis not present

## 2013-12-11 DIAGNOSIS — E1140 Type 2 diabetes mellitus with diabetic neuropathy, unspecified: Secondary | ICD-10-CM | POA: Diagnosis present

## 2013-12-11 DIAGNOSIS — D649 Anemia, unspecified: Secondary | ICD-10-CM | POA: Diagnosis not present

## 2013-12-11 DIAGNOSIS — I739 Peripheral vascular disease, unspecified: Secondary | ICD-10-CM | POA: Diagnosis present

## 2013-12-11 DIAGNOSIS — D638 Anemia in other chronic diseases classified elsewhere: Secondary | ICD-10-CM | POA: Diagnosis present

## 2013-12-11 DIAGNOSIS — W010XXA Fall on same level from slipping, tripping and stumbling without subsequent striking against object, initial encounter: Secondary | ICD-10-CM | POA: Diagnosis present

## 2013-12-11 DIAGNOSIS — D72829 Elevated white blood cell count, unspecified: Secondary | ICD-10-CM | POA: Diagnosis not present

## 2013-12-11 DIAGNOSIS — F17210 Nicotine dependence, cigarettes, uncomplicated: Secondary | ICD-10-CM | POA: Diagnosis present

## 2013-12-11 DIAGNOSIS — E785 Hyperlipidemia, unspecified: Secondary | ICD-10-CM | POA: Diagnosis present

## 2013-12-11 DIAGNOSIS — Z8249 Family history of ischemic heart disease and other diseases of the circulatory system: Secondary | ICD-10-CM

## 2013-12-11 DIAGNOSIS — Z9842 Cataract extraction status, left eye: Secondary | ICD-10-CM | POA: Diagnosis not present

## 2013-12-11 DIAGNOSIS — I350 Nonrheumatic aortic (valve) stenosis: Secondary | ICD-10-CM | POA: Diagnosis not present

## 2013-12-11 DIAGNOSIS — I4819 Other persistent atrial fibrillation: Secondary | ICD-10-CM

## 2013-12-11 DIAGNOSIS — Z952 Presence of prosthetic heart valve: Secondary | ICD-10-CM | POA: Diagnosis not present

## 2013-12-11 DIAGNOSIS — T148 Other injury of unspecified body region: Secondary | ICD-10-CM | POA: Diagnosis not present

## 2013-12-11 DIAGNOSIS — I481 Persistent atrial fibrillation: Secondary | ICD-10-CM | POA: Diagnosis not present

## 2013-12-11 DIAGNOSIS — W19XXXA Unspecified fall, initial encounter: Secondary | ICD-10-CM | POA: Diagnosis not present

## 2013-12-11 DIAGNOSIS — Z794 Long term (current) use of insulin: Secondary | ICD-10-CM

## 2013-12-11 DIAGNOSIS — S72142A Displaced intertrochanteric fracture of left femur, initial encounter for closed fracture: Secondary | ICD-10-CM | POA: Diagnosis not present

## 2013-12-11 DIAGNOSIS — I10 Essential (primary) hypertension: Secondary | ICD-10-CM | POA: Diagnosis present

## 2013-12-11 DIAGNOSIS — M810 Age-related osteoporosis without current pathological fracture: Secondary | ICD-10-CM | POA: Diagnosis present

## 2013-12-11 DIAGNOSIS — E1165 Type 2 diabetes mellitus with hyperglycemia: Secondary | ICD-10-CM | POA: Diagnosis present

## 2013-12-11 DIAGNOSIS — Z9841 Cataract extraction status, right eye: Secondary | ICD-10-CM

## 2013-12-11 DIAGNOSIS — Z8551 Personal history of malignant neoplasm of bladder: Secondary | ICD-10-CM | POA: Diagnosis not present

## 2013-12-11 DIAGNOSIS — R278 Other lack of coordination: Secondary | ICD-10-CM | POA: Diagnosis not present

## 2013-12-11 DIAGNOSIS — Z961 Presence of intraocular lens: Secondary | ICD-10-CM | POA: Diagnosis present

## 2013-12-11 DIAGNOSIS — R296 Repeated falls: Secondary | ICD-10-CM | POA: Diagnosis not present

## 2013-12-11 DIAGNOSIS — Z823 Family history of stroke: Secondary | ICD-10-CM | POA: Diagnosis not present

## 2013-12-11 DIAGNOSIS — Z7983 Long term (current) use of bisphosphonates: Secondary | ICD-10-CM

## 2013-12-11 DIAGNOSIS — Z7901 Long term (current) use of anticoagulants: Secondary | ICD-10-CM

## 2013-12-11 DIAGNOSIS — J439 Emphysema, unspecified: Secondary | ICD-10-CM

## 2013-12-11 DIAGNOSIS — M21252 Flexion deformity, left hip: Secondary | ICD-10-CM | POA: Diagnosis not present

## 2013-12-11 DIAGNOSIS — E118 Type 2 diabetes mellitus with unspecified complications: Secondary | ICD-10-CM

## 2013-12-11 DIAGNOSIS — M25552 Pain in left hip: Secondary | ICD-10-CM | POA: Diagnosis not present

## 2013-12-11 DIAGNOSIS — I5032 Chronic diastolic (congestive) heart failure: Secondary | ICD-10-CM | POA: Diagnosis present

## 2013-12-11 DIAGNOSIS — S72143A Displaced intertrochanteric fracture of unspecified femur, initial encounter for closed fracture: Secondary | ICD-10-CM | POA: Diagnosis present

## 2013-12-11 DIAGNOSIS — M79605 Pain in left leg: Secondary | ICD-10-CM | POA: Diagnosis not present

## 2013-12-11 DIAGNOSIS — S72145D Nondisplaced intertrochanteric fracture of left femur, subsequent encounter for closed fracture with routine healing: Secondary | ICD-10-CM | POA: Diagnosis not present

## 2013-12-11 DIAGNOSIS — E119 Type 2 diabetes mellitus without complications: Secondary | ICD-10-CM | POA: Diagnosis not present

## 2013-12-11 NOTE — ED Notes (Signed)
Bed: WA07 Expected date:  Expected time:  Means of arrival:  Comments: 57 M fall

## 2013-12-11 NOTE — ED Provider Notes (Signed)
CSN: [id number]     Arrival date & time 12/11/13  2241 History   First MD Initiated Contact with Patient 12/11/13 2253     Chief Complaint  Patient presents with  . Fall     (Consider location/radiation/quality/duration/timing/severity/associated sxs/prior Treatment) HPI This is an 80 year old male with a history of right hip fracture. He was carrying some material for party this afternoon about 1:30 and his toe caught on the edge of a rock causing him to fall. He fell onto his left hip. He states it was immediate excruciating pain in the left hip and is not able to bear weight on it. Pain is worse with movement or palpation. His grandson helped him to his motorized scooter and he continued working on the party preparations. He came to the ED this evening because of persistent pain. He denies other injury. He is on Coumadin for valve replacement and atrial fibrillation.   Past Medical History  Diagnosis Date  . AS (aortic stenosis)     bovine aortic valve replacement 01/07/11  . HTN (hypertension)   . Thrombocytopenia due to drugs     seen by Dr Inda Merlin plts [id number] no rx  . Peripheral vascular disease very poor circulation legs and feet ... stents right and left legs... done in dr j. Gwenlyn Found 's office.   . Depression wife died 4 years ago.    Marland Kitchen COPD (chronic obstructive pulmonary disease)   . Shortness of breath   . Recurrent upper respiratory infection (URI)     sinusitis  . Claudication in peripheral vascular disease 06/17/2011  . S/P angioplasty with stent, diamond back rotational athrectomy Prox. Rt. SFA 06/17/2011 06/17/2011  . Neuropathy due to secondary diabetes   . Type II diabetes mellitus   . Pneumonia 07/25/11    left  . Persistent atrial fibrillation   . Blood transfusion     w/hip operation  . History of stomach ulcers ~ 1951  . Renal artery stenosis 2006    renal artery stent  . Bladder cancer     Bladder Cancer local  . Cellulitis of left lower extremity   .  Chronic diastolic congestive heart failure   . Normal coronary arteries Sept 2012   Past Surgical History  Procedure Laterality Date  . Peripheral arterial stent graft      2006 left anf right illiac stents Dr Deon Pilling  . Aortic valve replacement  01/07/2011    Procedure: AORTIC VALVE REPLACEMENT (AVR);  Surgeon: Grace Isaac, MD;  Location: [place];  Service: Open Heart Surgery;  Laterality: N/A;; magna-ease bovine 46mm bioprosthesis  . Cardioversion  04/03/2011    Procedure: CARDIOVERSION;  Surgeon: Leonie Man, MD;  Location: [place];  Service: Cardiovascular;  Laterality: N/A;  . Lower extremity angiogram  06/17/2011    diamondback orbital rotational and cutting balloon atherectomy of the prox R SFA  . Femur im nail  06/27/2011    Procedure: INTRAMEDULLARY (IM) NAIL FEMORAL;  Surgeon: Marin Shutter, MD;  Location: [facility];  Service: Orthopedics;  Laterality: Right;  . Tonsillectomy and adenoidectomy      "when I was a kid"  . Cholecystectomy    . Cataract extraction w/ intraocular lens  implant, bilateral  ~ 2007  . Renal artery stent  2006    "I believe"  . Cardiac catheterization  11/19/10    normal coronaries, mod AS, 75% l RAS   Family History  Problem Relation Age of Onset  . Heart disease Mother   .  Cancer Brother     colon  . Stroke Father    History  Substance Use Topics  . Smoking status: Current Every Day Smoker -- 2.00 packs/day for 75 years    Types: Cigarettes  . Smokeless tobacco: Never Used  . Alcohol Use: 1.2 oz/week    2 Cans of beer per week    Review of Systems  All other systems reviewed and are negative.   Allergies  Aspirin  Home Medications   Prior to Admission medications   Medication Sig Start Date End Date Taking? Authorizing Provider  alendronate (FOSAMAX) 70 MG tablet Take 70 mg by mouth every 14 (fourteen) days. Take with a full glass of water on an empty stomach.    Historical Provider, MD  benazepril (LOTENSIN) 10 MG tablet Take 10  mg by mouth daily.    Historical Provider, MD  doxazosin (CARDURA) 4 MG tablet Take 4 mg by mouth daily.     Coolidge Breeze, PA-C  finasteride (PROSCAR) 5 MG tablet Take 5 mg by mouth daily.     Historical Provider, MD  furosemide (LASIX) 40 MG tablet Take 1 tablet (40 mg total) by mouth 2 (two) times daily. 07/08/12   Brett Canales, PA-C  glyBURIDE (DIABETA) 5 MG tablet Take 2.5-10 mg by mouth 2 (two) times daily with a meal. 2 tablets in the morning and 1/2 tablet in the evening    Historical Provider, MD  insulin glargine (LANTUS) 100 UNIT/ML injection Inject 21 Units into the skin every morning.  07/29/11   Precious Reel, MD  loratadine (CLARITIN) 10 MG tablet Take 10 mg by mouth daily.    Historical Provider, MD  metolazone (ZAROXOLYN) 2.5 MG tablet Take 2.5 mg by mouth daily. For 3 days only 09/10/13   Historical Provider, MD  metoprolol (LOPRESSOR) 50 MG tablet Take 50 mg by mouth 2 (two) times daily.    Historical Provider, MD  potassium chloride SA (K-DUR,KLOR-CON) 20 MEQ tablet Take 20 mEq by mouth daily.    Historical Provider, MD  simvastatin (ZOCOR) 20 MG tablet Take 20 mg by mouth every evening.    Historical Provider, MD  traMADol (ULTRAM) 50 MG tablet Take 1 tablet (50 mg total) by mouth 2 (two) times daily. 04/15/13   Donne Hazel, MD  Umeclidinium-Vilanterol [facility] ELLIPTA) 62.5-25 MCG/INH AEPB Inhale 1 puff into the lungs daily.    Historical Provider, MD  warfarin (COUMADIN) 5 MG tablet Take 2.5 mg by mouth every evening.    Historical Provider, MD  warfarin (COUMADIN) 5 MG tablet TAKE 1 TABLET DAILY AS DIRECTED.    Lorretta Harp, MD   BP 160/87  Pulse 85  Temp(Src) 98.9 F (37.2 C) (Oral)  Resp 23  SpO2 95%  Physical Exam General: Well-developed, well-nourished male in no acute distress; appearance consistent with age of record HENT: normocephalic; atraumatic Eyes: pupils equal, round and reactive to light; extraocular muscles intact Neck: supple; nontender Heart:  Irregular rhythm Lungs: clear to auscultation bilaterally Abdomen: soft; nondistended; nontender; no masses or hepatosplenomegaly; bowel sounds present Extremities: Left lower extremity mildly shortened and externally rotated; significant pain in left hip on attempted range of motion; pulses normal; possible edema of lower legs Neurologic: Awake, alert and oriented; motor function intact in all extremities and symmetric; no facial droop Skin: Warm and dry; chronic-appearing hyperpigmentation of lower legs Psychiatric: Normal mood and affect     ED Course  Procedures (including critical care time)   MDM  Nursing notes and vitals signs, including pulse oximetry, reviewed.  Summary of this visit's results, reviewed by myself:  Labs:  Results for orders placed during the hospital encounter of 12/11/13 (from the past 24 hour(s))  CBC WITH DIFFERENTIAL     Status: Abnormal   Collection Time    12/12/13 12:45 AM      Result Value Ref Range   WBC 12.5 (*) 4.0 - 10.5 K/uL   RBC 3.75 (*) 4.22 - 5.81 MIL/uL   Hemoglobin 11.9 (*) 13.0 - 17.0 g/dL   HCT 34.5 (*) 39.0 - 52.0 %   MCV 92.0  78.0 - 100.0 fL   MCH 31.7  26.0 - 34.0 pg   MCHC 34.5  30.0 - 36.0 g/dL   RDW 14.4  11.5 - 15.5 %   Platelets 79 (*) 150 - 400 K/uL   Neutrophils Relative % 85 (*) 43 - 77 %   Lymphocytes Relative 7 (*) 12 - 46 %   Monocytes Relative 8  3 - 12 %   Eosinophils Relative 0  0 - 5 %   Basophils Relative 0  0 - 1 %   Neutro Abs 10.6 (*) 1.7 - 7.7 K/uL   Lymphs Abs 0.9  0.7 - 4.0 K/uL   Monocytes Absolute 1.0  0.1 - 1.0 K/uL   Eosinophils Absolute 0.0  0.0 - 0.7 K/uL   Basophils Absolute 0.0  0.0 - 0.1 K/uL  BASIC METABOLIC PANEL     Status: Abnormal   Collection Time    12/12/13 12:45 AM      Result Value Ref Range   Sodium 141  137 - 147 mEq/L   Potassium 3.7  3.7 - 5.3 mEq/L   Chloride 99  96 - 112 mEq/L   CO2 27  19 - 32 mEq/L   Glucose, Bld 311 (*) 70 - 99 mg/dL   BUN 21  6 - 23 mg/dL    Creatinine, Ser 1.32  0.50 - 1.35 mg/dL   Calcium 9.0  8.4 - 10.5 mg/dL   GFR calc non Af Amer 46 (*) >90 mL/min   GFR calc Af Amer 53 (*) >90 mL/min   Anion gap 15  5 - 15  PROTIME-INR     Status: Abnormal   Collection Time    12/12/13 12:45 AM      Result Value Ref Range   Prothrombin Time 44.8 (*) 11.6 - 15.2 seconds   INR 4.73 (*) 0.00 - 1.49    Imaging Studies: Dg Chest 1 View  12/12/2013   CLINICAL DATA:  Preop for left hip fracture initial encounter  EXAM: CHEST - 1 VIEW  COMPARISON:  08/27/2013  FINDINGS: Prominent left ventricular size. The patient is status post median sternotomy for left atrial appendage exclusion and aortic valve replacement. Aortic contours are stable. Pulmonary hyperinflation consistent with COPD. There is no edema, consolidation, effusion, or pneumothorax.  IMPRESSION: 1.  No active disease. 2. Chronic and postoperative changes described above.   Electronically Signed   By: Jorje Guild M.D.   On: 12/12/2013 00:01   Dg Hip Complete Left  12/12/2013   CLINICAL DATA:  80 year old male post fall with left hip pain. Initial encounter.  EXAM: LEFT HIP - COMPLETE 2+ VIEW  COMPARISON:  None.  FINDINGS: Slightly separated 4 part left intertrochanteric fracture.  Remote right hip fracture treated with internal fixation.  Iliac stents in place.  IMPRESSION: Slightly separated 4 part left intertrochanteric fracture.   Electronically Signed  By: Chauncey Cruel M.D.   On: 12/12/2013 00:01    1:24 AM Discussed with Dr. Hillery Aldo of [facility]. They will consult for the patient's fracture.   2:04 AM Dr. Maudie Mercury to admit to hospitalist service.  Wynetta Fines, MD 12/12/13 [phone]

## 2013-12-11 NOTE — ED Notes (Addendum)
Pt states he has had more falls since lt hip repair 2 years ago. He has a skin tear to lt arm from a fall on today.  Pt is hard of hearing,wears bil. Hearing aides, alert and oriented x 4.

## 2013-12-11 NOTE — ED Notes (Signed)
Per EMS, pt fell earlier today at home. Had some progressing pain in rt femoral/groin area that has not ceased. Since pain has increased and swelling noted. CBG 340 IV started in route and pt was given Fentanyl 150 mcg, IV.

## 2013-12-12 ENCOUNTER — Encounter (HOSPITAL_COMMUNITY): Payer: Self-pay | Admitting: *Deleted

## 2013-12-12 DIAGNOSIS — Z809 Family history of malignant neoplasm, unspecified: Secondary | ICD-10-CM | POA: Diagnosis not present

## 2013-12-12 DIAGNOSIS — Z8551 Personal history of malignant neoplasm of bladder: Secondary | ICD-10-CM | POA: Diagnosis not present

## 2013-12-12 DIAGNOSIS — F329 Major depressive disorder, single episode, unspecified: Secondary | ICD-10-CM | POA: Diagnosis present

## 2013-12-12 DIAGNOSIS — I739 Peripheral vascular disease, unspecified: Secondary | ICD-10-CM | POA: Diagnosis present

## 2013-12-12 DIAGNOSIS — D649 Anemia, unspecified: Secondary | ICD-10-CM | POA: Diagnosis not present

## 2013-12-12 DIAGNOSIS — E1165 Type 2 diabetes mellitus with hyperglycemia: Secondary | ICD-10-CM | POA: Diagnosis not present

## 2013-12-12 DIAGNOSIS — Z823 Family history of stroke: Secondary | ICD-10-CM | POA: Diagnosis not present

## 2013-12-12 DIAGNOSIS — N400 Enlarged prostate without lower urinary tract symptoms: Secondary | ICD-10-CM | POA: Diagnosis not present

## 2013-12-12 DIAGNOSIS — S72143A Displaced intertrochanteric fracture of unspecified femur, initial encounter for closed fracture: Secondary | ICD-10-CM | POA: Diagnosis present

## 2013-12-12 DIAGNOSIS — M810 Age-related osteoporosis without current pathological fracture: Secondary | ICD-10-CM | POA: Diagnosis present

## 2013-12-12 DIAGNOSIS — Z9841 Cataract extraction status, right eye: Secondary | ICD-10-CM | POA: Diagnosis not present

## 2013-12-12 DIAGNOSIS — Z9842 Cataract extraction status, left eye: Secondary | ICD-10-CM | POA: Diagnosis not present

## 2013-12-12 DIAGNOSIS — Z8249 Family history of ischemic heart disease and other diseases of the circulatory system: Secondary | ICD-10-CM | POA: Diagnosis not present

## 2013-12-12 DIAGNOSIS — F17210 Nicotine dependence, cigarettes, uncomplicated: Secondary | ICD-10-CM | POA: Diagnosis present

## 2013-12-12 DIAGNOSIS — S72132A Displaced apophyseal fracture of left femur, initial encounter for closed fracture: Secondary | ICD-10-CM | POA: Diagnosis not present

## 2013-12-12 DIAGNOSIS — E785 Hyperlipidemia, unspecified: Secondary | ICD-10-CM | POA: Diagnosis present

## 2013-12-12 DIAGNOSIS — D62 Acute posthemorrhagic anemia: Secondary | ICD-10-CM | POA: Diagnosis not present

## 2013-12-12 DIAGNOSIS — R0602 Shortness of breath: Secondary | ICD-10-CM | POA: Diagnosis not present

## 2013-12-12 DIAGNOSIS — Z953 Presence of xenogenic heart valve: Secondary | ICD-10-CM | POA: Diagnosis not present

## 2013-12-12 DIAGNOSIS — Z7901 Long term (current) use of anticoagulants: Secondary | ICD-10-CM | POA: Diagnosis not present

## 2013-12-12 DIAGNOSIS — E1140 Type 2 diabetes mellitus with diabetic neuropathy, unspecified: Secondary | ICD-10-CM

## 2013-12-12 DIAGNOSIS — D696 Thrombocytopenia, unspecified: Secondary | ICD-10-CM | POA: Diagnosis present

## 2013-12-12 DIAGNOSIS — Z961 Presence of intraocular lens: Secondary | ICD-10-CM | POA: Diagnosis present

## 2013-12-12 DIAGNOSIS — Z8711 Personal history of peptic ulcer disease: Secondary | ICD-10-CM | POA: Diagnosis not present

## 2013-12-12 DIAGNOSIS — J449 Chronic obstructive pulmonary disease, unspecified: Secondary | ICD-10-CM | POA: Diagnosis not present

## 2013-12-12 DIAGNOSIS — D72829 Elevated white blood cell count, unspecified: Secondary | ICD-10-CM | POA: Diagnosis not present

## 2013-12-12 DIAGNOSIS — S72145D Nondisplaced intertrochanteric fracture of left femur, subsequent encounter for closed fracture with routine healing: Secondary | ICD-10-CM | POA: Diagnosis not present

## 2013-12-12 DIAGNOSIS — Z952 Presence of prosthetic heart valve: Secondary | ICD-10-CM | POA: Diagnosis not present

## 2013-12-12 DIAGNOSIS — W19XXXA Unspecified fall, initial encounter: Secondary | ICD-10-CM | POA: Diagnosis not present

## 2013-12-12 DIAGNOSIS — M21252 Flexion deformity, left hip: Secondary | ICD-10-CM | POA: Diagnosis not present

## 2013-12-12 DIAGNOSIS — W010XXA Fall on same level from slipping, tripping and stumbling without subsequent striking against object, initial encounter: Secondary | ICD-10-CM | POA: Diagnosis present

## 2013-12-12 DIAGNOSIS — Z794 Long term (current) use of insulin: Secondary | ICD-10-CM | POA: Diagnosis not present

## 2013-12-12 DIAGNOSIS — I350 Nonrheumatic aortic (valve) stenosis: Secondary | ICD-10-CM | POA: Diagnosis not present

## 2013-12-12 DIAGNOSIS — R296 Repeated falls: Secondary | ICD-10-CM | POA: Diagnosis not present

## 2013-12-12 DIAGNOSIS — I481 Persistent atrial fibrillation: Secondary | ICD-10-CM

## 2013-12-12 DIAGNOSIS — I10 Essential (primary) hypertension: Secondary | ICD-10-CM | POA: Diagnosis present

## 2013-12-12 DIAGNOSIS — M25552 Pain in left hip: Secondary | ICD-10-CM | POA: Diagnosis not present

## 2013-12-12 DIAGNOSIS — E119 Type 2 diabetes mellitus without complications: Secondary | ICD-10-CM | POA: Diagnosis not present

## 2013-12-12 DIAGNOSIS — I5032 Chronic diastolic (congestive) heart failure: Secondary | ICD-10-CM | POA: Diagnosis not present

## 2013-12-12 DIAGNOSIS — Z886 Allergy status to analgesic agent status: Secondary | ICD-10-CM | POA: Diagnosis not present

## 2013-12-12 DIAGNOSIS — S72142A Displaced intertrochanteric fracture of left femur, initial encounter for closed fracture: Secondary | ICD-10-CM | POA: Diagnosis not present

## 2013-12-12 DIAGNOSIS — R278 Other lack of coordination: Secondary | ICD-10-CM | POA: Diagnosis not present

## 2013-12-12 DIAGNOSIS — D638 Anemia in other chronic diseases classified elsewhere: Secondary | ICD-10-CM | POA: Diagnosis present

## 2013-12-12 LAB — CBC WITH DIFFERENTIAL/PLATELET
BASOS ABS: 0 10*3/uL (ref 0.0–0.1)
Basophils Relative: 0 % (ref 0–1)
EOS ABS: 0 10*3/uL (ref 0.0–0.7)
Eosinophils Relative: 0 % (ref 0–5)
HEMATOCRIT: 34.5 % — AB (ref 39.0–52.0)
Hemoglobin: 11.9 g/dL — ABNORMAL LOW (ref 13.0–17.0)
Lymphocytes Relative: 7 % — ABNORMAL LOW (ref 12–46)
Lymphs Abs: 0.9 10*3/uL (ref 0.7–4.0)
MCH: 31.7 pg (ref 26.0–34.0)
MCHC: 34.5 g/dL (ref 30.0–36.0)
MCV: 92 fL (ref 78.0–100.0)
Monocytes Absolute: 1 10*3/uL (ref 0.1–1.0)
Monocytes Relative: 8 % (ref 3–12)
NEUTROS ABS: 10.6 10*3/uL — AB (ref 1.7–7.7)
Neutrophils Relative %: 85 % — ABNORMAL HIGH (ref 43–77)
Platelets: 79 10*3/uL — ABNORMAL LOW (ref 150–400)
RBC: 3.75 MIL/uL — ABNORMAL LOW (ref 4.22–5.81)
RDW: 14.4 % (ref 11.5–15.5)
WBC: 12.5 10*3/uL — ABNORMAL HIGH (ref 4.0–10.5)

## 2013-12-12 LAB — PROTIME-INR
INR: 4.73 — ABNORMAL HIGH (ref 0.00–1.49)
Prothrombin Time: 44.8 seconds — ABNORMAL HIGH (ref 11.6–15.2)

## 2013-12-12 LAB — BASIC METABOLIC PANEL
ANION GAP: 15 (ref 5–15)
BUN: 21 mg/dL (ref 6–23)
CALCIUM: 9 mg/dL (ref 8.4–10.5)
CO2: 27 mEq/L (ref 19–32)
Chloride: 99 mEq/L (ref 96–112)
Creatinine, Ser: 1.32 mg/dL (ref 0.50–1.35)
GFR calc Af Amer: 53 mL/min — ABNORMAL LOW (ref >90)
GFR calc non Af Amer: 46 mL/min — ABNORMAL LOW (ref >90)
Glucose, Bld: 311 mg/dL — ABNORMAL HIGH (ref 70–99)
Potassium: 3.7 mEq/L (ref 3.7–5.3)
Sodium: 141 mEq/L (ref 137–147)

## 2013-12-12 LAB — GLUCOSE, CAPILLARY: GLUCOSE-CAPILLARY: 280 mg/dL — AB (ref 70–99)

## 2013-12-12 MED ORDER — DOXAZOSIN MESYLATE 4 MG PO TABS
4.0000 mg | ORAL_TABLET | Freq: Every day | ORAL | Status: DC
  Administered 2013-12-12 – 2013-12-16 (×5): 4 mg via ORAL
  Filled 2013-12-12 (×5): qty 1

## 2013-12-12 MED ORDER — ACETAMINOPHEN 650 MG RE SUPP: 650.0000 mg | Freq: Four times a day (QID) | RECTAL | Status: DC | PRN

## 2013-12-12 MED ORDER — INSULIN GLARGINE 100 UNIT/ML ~~LOC~~ SOLN
21.0000 [IU] | Freq: Every morning | SUBCUTANEOUS | Status: DC
  Administered 2013-12-12 – 2013-12-16 (×5): 21 [IU] via SUBCUTANEOUS
  Filled 2013-12-12 (×5): qty 0.21

## 2013-12-12 MED ORDER — ACETAMINOPHEN 325 MG PO TABS: 650.0000 mg | ORAL_TABLET | Freq: Four times a day (QID) | ORAL | Status: DC | PRN

## 2013-12-12 MED ORDER — HYDROMORPHONE HCL 1 MG/ML IJ SOLN
0.5000 mg | INTRAMUSCULAR | Status: DC | PRN
  Administered 2013-12-12: 0.5 mg via INTRAVENOUS
  Filled 2013-12-12: qty 1

## 2013-12-12 MED ORDER — POTASSIUM CHLORIDE CRYS ER 20 MEQ PO TBCR
20.0000 meq | EXTENDED_RELEASE_TABLET | Freq: Every day | ORAL | Status: DC
  Administered 2013-12-12 – 2013-12-16 (×5): 20 meq via ORAL
  Filled 2013-12-12 (×5): qty 1

## 2013-12-12 MED ORDER — METOPROLOL TARTRATE 50 MG PO TABS
50.0000 mg | ORAL_TABLET | Freq: Two times a day (BID) | ORAL | Status: DC
  Filled 2013-12-12: qty 1

## 2013-12-12 MED ORDER — SODIUM CHLORIDE 0.9 % IJ SOLN
3.0000 mL | Freq: Two times a day (BID) | INTRAMUSCULAR | Status: DC
  Administered 2013-12-12 – 2013-12-15 (×2): 3 mL via INTRAVENOUS

## 2013-12-12 MED ORDER — FLUTICASONE PROPIONATE 50 MCG/ACT NA SUSP
1.0000 | Freq: Every day | NASAL | Status: DC
  Administered 2013-12-12 – 2013-12-16 (×5): 1 via NASAL
  Filled 2013-12-12: qty 16

## 2013-12-12 MED ORDER — BENAZEPRIL HCL 10 MG PO TABS
10.0000 mg | ORAL_TABLET | Freq: Every day | ORAL | Status: DC
  Administered 2013-12-12 – 2013-12-16 (×5): 10 mg via ORAL
  Filled 2013-12-12 (×5): qty 1

## 2013-12-12 MED ORDER — ONDANSETRON HCL 4 MG/2ML IJ SOLN: 4.0000 mg | Freq: Four times a day (QID) | INTRAMUSCULAR | Status: DC | PRN

## 2013-12-12 MED ORDER — CETYLPYRIDINIUM CHLORIDE 0.05 % MT LIQD
7.0000 mL | Freq: Two times a day (BID) | OROMUCOSAL | Status: DC
Start: 2013-12-12 — End: 2013-12-12

## 2013-12-12 MED ORDER — FUROSEMIDE 40 MG PO TABS
40.0000 mg | ORAL_TABLET | Freq: Two times a day (BID) | ORAL | Status: DC
  Administered 2013-12-12 – 2013-12-16 (×9): 40 mg via ORAL
  Filled 2013-12-12 (×9): qty 1

## 2013-12-12 MED ORDER — CHLORHEXIDINE GLUCONATE 0.12 % MT SOLN
15.0000 mL | Freq: Two times a day (BID) | OROMUCOSAL | Status: DC
  Administered 2013-12-12: 15 mL via OROMUCOSAL
  Filled 2013-12-12: qty 15

## 2013-12-12 MED ORDER — FINASTERIDE 5 MG PO TABS
5.0000 mg | ORAL_TABLET | Freq: Every day | ORAL | Status: DC
  Administered 2013-12-12 – 2013-12-16 (×5): 5 mg via ORAL
  Filled 2013-12-12 (×5): qty 1

## 2013-12-12 MED ORDER — TRAMADOL HCL 50 MG PO TABS
50.0000 mg | ORAL_TABLET | Freq: Two times a day (BID) | ORAL | Status: DC
  Administered 2013-12-12 – 2013-12-16 (×8): 50 mg via ORAL
  Filled 2013-12-12 (×8): qty 1

## 2013-12-12 MED ORDER — METOLAZONE 2.5 MG PO TABS: 2.5000 mg | ORAL_TABLET | Freq: Every day | ORAL | Status: DC

## 2013-12-12 MED ORDER — FENTANYL CITRATE 0.05 MG/ML IJ SOLN
50.0000 ug | INTRAMUSCULAR | Status: DC | PRN
  Administered 2013-12-12 (×2): 50 ug via INTRAVENOUS
  Filled 2013-12-12 (×3): qty 2

## 2013-12-12 MED ORDER — LORATADINE 10 MG PO TABS
10.0000 mg | ORAL_TABLET | Freq: Every day | ORAL | Status: DC
  Administered 2013-12-12 – 2013-12-16 (×5): 10 mg via ORAL
  Filled 2013-12-12 (×5): qty 1

## 2013-12-12 MED ORDER — METOPROLOL SUCCINATE ER 25 MG PO TB24
50.0000 mg | ORAL_TABLET | Freq: Two times a day (BID) | ORAL | Status: DC
  Administered 2013-12-12 – 2013-12-14 (×5): 50 mg via ORAL
  Filled 2013-12-12 (×4): qty 2

## 2013-12-12 MED ORDER — SIMVASTATIN 20 MG PO TABS
20.0000 mg | ORAL_TABLET | Freq: Every evening | ORAL | Status: DC
  Administered 2013-12-12 – 2013-12-15 (×4): 20 mg via ORAL
  Filled 2013-12-12 (×5): qty 1

## 2013-12-12 MED ORDER — ALENDRONATE SODIUM 70 MG PO TABS: 70.0000 mg | ORAL_TABLET | ORAL | Status: DC

## 2013-12-12 MED ORDER — SODIUM CHLORIDE 0.9 % IV SOLN
INTRAVENOUS | Status: DC
  Administered 2013-12-12 – 2013-12-13 (×4): via INTRAVENOUS

## 2013-12-12 MED ORDER — GLYBURIDE 5 MG PO TABS
10.0000 mg | ORAL_TABLET | Freq: Every day | ORAL | Status: DC
  Administered 2013-12-12 – 2013-12-13 (×2): 10 mg via ORAL
  Filled 2013-12-12 (×2): qty 2

## 2013-12-12 MED ORDER — UMECLIDINIUM-VILANTEROL 62.5-25 MCG/INH IN AEPB
1.0000 | INHALATION_SPRAY | Freq: Every day | RESPIRATORY_TRACT | Status: DC
  Administered 2013-12-12 – 2013-12-15 (×4): 1 via RESPIRATORY_TRACT

## 2013-12-12 MED ORDER — GLYBURIDE 2.5 MG PO TABS
2.5000 mg | ORAL_TABLET | Freq: Every day | ORAL | Status: DC
  Administered 2013-12-12: 2.5 mg via ORAL
  Filled 2013-12-12 (×2): qty 1

## 2013-12-12 MED ORDER — SODIUM CHLORIDE 0.9 % IV SOLN
INTRAVENOUS | Status: AC
  Administered 2013-12-12: 03:00:00 via INTRAVENOUS

## 2013-12-12 NOTE — Progress Notes (Signed)
PHARMACIST-PHYSICIAN COMMUNICATION  Topic:  P&T Committee Policy for Bisphosphonate Medications  Bisphosphonates, such as Fosamax, Boniva, and Actonel, can cause severe esophageal erosions in patients who are unable to remain upright for at least 30 minutes after taking the medication.  Since brief interruptions in therapy are thought to have minimal impact on bone mineral density, the Pharmacy and Therapeutics Committee has determined that these medications should be routinely held during hospitalization.  Action taken: Your order for Fosamax has been discontinued.  If the patient's post-hospital medical condition warrants safe use of this class of drugs, please resume the pre-hospital regimen of Fosamax upon discharge.  [place]. 12/12/2013 7:14 AM

## 2013-12-12 NOTE — Progress Notes (Signed)
Pt has tolerated bucks traction, denies any pain, pedal pulse palpable and warm to touch, wiggles toes

## 2013-12-12 NOTE — H&P (Signed)
Ronald [name] is an 80 y.o. male.    Pcp:  [name]  Chief Complaint: fall, left intertrochanteric hip fracture HPI: 80 year old male with a history of right hip fracture. He was carrying some material for party this afternoon about 1:30 and his toe caught on the edge of a rock causing him to fall. He fell onto his left hip. He states it was immediate excruciating pain in the left hip and is not able to bear weight on it. Pain is worse with movement or palpation. His grandson helped him to his motorized scooter and he continued working on the party preparations. He came to the ED this evening because of persistent pain. He denies other injury. He is on Coumadin for valve replacement and atrial fibrillation.  Pt was found on xray to have L intertrochanteric hip fracture.  Hillery Aldo from orthopedics is aware of the patient and requested admission by medicine. Dr. Sharlett Iles on call for [facility] requested [facility] Admission.     Past Medical History  Diagnosis Date  . AS (aortic stenosis)     bovine aortic valve replacement 01/07/11  . HTN (hypertension)   . Thrombocytopenia due to drugs     seen by Dr Inda Merlin plts [id number] no rx  . Peripheral vascular disease very poor circulation legs and feet ... stents right and left legs... done in dr j. Gwenlyn Found 's office.   . Depression wife died 4 years ago.    Marland Kitchen COPD (chronic obstructive pulmonary disease)   . Shortness of breath   . Recurrent upper respiratory infection (URI)     sinusitis  . Claudication in peripheral vascular disease 06/17/2011  . S/P angioplasty with stent, diamond back rotational athrectomy Prox. Rt. SFA 06/17/2011 06/17/2011  . Neuropathy due to secondary diabetes   . Type II diabetes mellitus   . Pneumonia 07/25/11    left  . Persistent atrial fibrillation   . Blood transfusion     w/hip operation  . History of stomach ulcers ~ 1951  . Renal artery stenosis 2006    renal artery stent  . Bladder cancer     Bladder  Cancer local  . Cellulitis of left lower extremity   . Chronic diastolic congestive heart failure   . Normal coronary arteries Sept 2012    Past Surgical History  Procedure Laterality Date  . Peripheral arterial stent graft      2006 left anf right illiac stents Dr Deon Pilling  . Aortic valve replacement  01/07/2011    Procedure: AORTIC VALVE REPLACEMENT (AVR);  Surgeon: Grace Isaac, MD;  Location: [place];  Service: Open Heart Surgery;  Laterality: N/A;; magna-ease bovine 101m bioprosthesis  . Cardioversion  04/03/2011    Procedure: CARDIOVERSION;  Surgeon: DLeonie Man MD;  Location[place] [facility]  Service: Cardiovascular;  Laterality: N/A;  . Lower extremity angiogram  06/17/2011    diamondback orbital rotational and cutting balloon atherectomy of the prox R SFA  . Femur im nail  06/27/2011    Procedure: INTRAMEDULLARY (IM) NAIL FEMORAL;  Surgeon: KMarin Shutter MD;  Location: [facility];  Service: Orthopedics;  Laterality: Right;  . Tonsillectomy and adenoidectomy      "when I was a kid"  . Cholecystectomy    . Cataract extraction w/ intraocular lens  implant, bilateral  ~ 2007  . Renal artery stent  2006    "I believe"  . Cardiac catheterization  11/19/10    normal coronaries, mod AS, 75% l RAS  Family History  Problem Relation Age of Onset  . Heart disease Mother   . Cancer Brother     colon  . Stroke Father    Social History:  reports that he has been smoking Cigarettes.  He has a 150 pack-year smoking history. He has never used smokeless tobacco. He reports that he drinks about 1.2 ounces of alcohol per week. He reports that he does not use illicit drugs.  Allergies:  Allergies  Allergen Reactions  . Aspirin Anaphylaxis, Shortness Of Breath and Rash    "broke out in white welts; red blotches neck and face; windpipe closing; ~ 1962"     (Not in a hospital admission)  Results for orders placed during the hospital encounter of 12/11/13 (from the past 48 hour(s))  CBC WITH  DIFFERENTIAL     Status: Abnormal   Collection Time    12/12/13 12:45 AM      Result Value Ref Range   WBC 12.5 (*) 4.0 - 10.5 K/uL   RBC 3.75 (*) 4.22 - 5.81 MIL/uL   Hemoglobin 11.9 (*) 13.0 - 17.0 g/dL   HCT 34.5 (*) 39.0 - 52.0 %   MCV 92.0  78.0 - 100.0 fL   MCH 31.7  26.0 - 34.0 pg   MCHC 34.5  30.0 - 36.0 g/dL   RDW 14.4  11.5 - 15.5 %   Platelets 79 (*) 150 - 400 K/uL   Comment: SPECIMEN CHECKED FOR CLOTS     REPEATED TO VERIFY     PLATELET COUNT CONFIRMED BY SMEAR   Neutrophils Relative % 85 (*) 43 - 77 %   Lymphocytes Relative 7 (*) 12 - 46 %   Monocytes Relative 8  3 - 12 %   Eosinophils Relative 0  0 - 5 %   Basophils Relative 0  0 - 1 %   Neutro Abs 10.6 (*) 1.7 - 7.7 K/uL   Lymphs Abs 0.9  0.7 - 4.0 K/uL   Monocytes Absolute 1.0  0.1 - 1.0 K/uL   Eosinophils Absolute 0.0  0.0 - 0.7 K/uL   Basophils Absolute 0.0  0.0 - 0.1 K/uL  BASIC METABOLIC PANEL     Status: Abnormal   Collection Time    12/12/13 12:45 AM      Result Value Ref Range   Sodium 141  137 - 147 mEq/L   Potassium 3.7  3.7 - 5.3 mEq/L   Chloride 99  96 - 112 mEq/L   CO2 27  19 - 32 mEq/L   Glucose, Bld 311 (*) 70 - 99 mg/dL   BUN 21  6 - 23 mg/dL   Creatinine, Ser 1.32  0.50 - 1.35 mg/dL   Calcium 9.0  8.4 - 10.5 mg/dL   GFR calc non Af Amer 46 (*) >90 mL/min   GFR calc Af Amer 53 (*) >90 mL/min   Comment: (NOTE)     The eGFR has been calculated using the CKD EPI equation.     This calculation has not been validated in all clinical situations.     eGFR's persistently <90 mL/min signify possible Chronic Kidney     Disease.   Anion gap 15  5 - 15  PROTIME-INR     Status: Abnormal   Collection Time    12/12/13 12:45 AM      Result Value Ref Range   Prothrombin Time 44.8 (*) 11.6 - 15.2 seconds   INR 4.73 (*) 0.00 - 1.49   Dg Chest 1  View  12/12/2013   CLINICAL DATA:  Preop for left hip fracture initial encounter  EXAM: CHEST - 1 VIEW  COMPARISON:  08/27/2013  FINDINGS: Prominent left  ventricular size. The patient is status post median sternotomy for left atrial appendage exclusion and aortic valve replacement. Aortic contours are stable. Pulmonary hyperinflation consistent with COPD. There is no edema, consolidation, effusion, or pneumothorax.  IMPRESSION: 1.  No active disease. 2. Chronic and postoperative changes described above.   Electronically Signed   By: Jorje Guild M.D.   On: 12/12/2013 00:01   Dg Hip Complete Left  12/12/2013   CLINICAL DATA:  80 year old male post fall with left hip pain. Initial encounter.  EXAM: LEFT HIP - COMPLETE 2+ VIEW  COMPARISON:  None.  FINDINGS: Slightly separated 4 part left intertrochanteric fracture.  Remote right hip fracture treated with internal fixation.  Iliac stents in place.  IMPRESSION: Slightly separated 4 part left intertrochanteric fracture.   Electronically Signed   By: Chauncey Cruel M.D.   On: 12/12/2013 00:01    Review of Systems  Constitutional: Negative.   HENT: Negative.   Eyes: Negative.   Respiratory: Negative.   Cardiovascular: Negative.   Gastrointestinal: Negative.   Genitourinary: Negative.   Musculoskeletal: Positive for falls and joint pain.  Skin: Negative.   Neurological: Negative.   Endo/Heme/Allergies: Negative.   Psychiatric/Behavioral: Negative.     Blood pressure 160/87, pulse 85, temperature 98.9 F (37.2 C), temperature source Oral, resp. rate 23, SpO2 95.00%. Physical Exam  Constitutional: He is oriented to person, place, and time. He appears well-developed and well-nourished.  HENT:  Head: Normocephalic and atraumatic.  Eyes: Conjunctivae and EOM are normal. Pupils are equal, round, and reactive to light.  Neck: Normal range of motion. Neck supple.  Cardiovascular: Normal rate, regular rhythm and normal heart sounds.   Respiratory: Effort normal and breath sounds normal.  GI: Soft. Bowel sounds are normal. There is no tenderness.  Musculoskeletal: Normal range of motion. He exhibits  no edema and no tenderness.  Neurological: He is alert and oriented to person, place, and time. He has normal reflexes. He displays normal reflexes. No cranial nerve deficit. He exhibits normal muscle tone. Coordination normal.  Skin: Skin is warm and dry. No rash noted. No erythema. No pallor.  Psychiatric: He has a normal mood and affect. His behavior is normal. Judgment and thought content normal.     Assessment/Plan L intertrochanteric hip fracture Appreciate orthopedics NPO Hold coumadin Dilaudid for pain control  Afib/AVR:  On coumadin,  Hold due to fact that INR supratherapeutic  Anemia: check cbc in am  Thrombocytopenia:  Check cbc in am  Dm2: fsbs q4h, insulin sensitive sliding scale  Osteoporosis: cont fosamax Consider bone density test as outpatient  Hyperlipidemia: cont simvastatin  Copd: stable  CHF: (diastolic), (YS=16% on echo 07/02/2012): strict i and o and cont lasix, cont metolazone   Arrington Bencomo 12/12/2013, 2:34 AM

## 2013-12-12 NOTE — Plan of Care (Signed)
Problem: Consults Goal: COPD Patient Education (See Patient Education Module for education specifics.)  Outcome: Progressing COPD book given but pt had pain med & doesn't feel like reviewing at this time

## 2013-12-12 NOTE — Consult Note (Signed)
Reason for Consult: Left hi[p fracture Referring Physician: Dr. Burman [name] is an 80 y.o. male.  HPI: Patient was at a party yesterday afternoon and while carrying some items, He trip and fell. He landed on his left hip and had immediate pain in this area. When the pain persisted he was brought the to [facility] ED for further evaluation. Imaging was obtained and found a Left Intertrochanteric fracture.  He denies SOB, CP, or syncope episode. On Coumadin for Aortic valve replacement in 2012, he is supratherapeutic.  Patient had a right hip fracture fixed by Dr. Onnie Graham. Other history below. He was admitted by medicine.   Past Medical History  Diagnosis Date  . AS (aortic stenosis)     bovine aortic valve replacement 01/07/11  . HTN (hypertension)   . Thrombocytopenia due to drugs     seen by Dr Inda Merlin plts 114000 no rx  . Peripheral vascular disease very poor circulation legs and feet ... stents right and left legs... done in dr j. Gwenlyn Found 's office.   . Depression wife died 4 years ago.    Marland Kitchen COPD (chronic obstructive pulmonary disease)   . Shortness of breath   . Recurrent upper respiratory infection (URI)     sinusitis  . Claudication in peripheral vascular disease 06/17/2011  . S/P angioplasty with stent, diamond back rotational athrectomy Prox. Rt. SFA 06/17/2011 06/17/2011  . Neuropathy due to secondary diabetes   . Type II diabetes mellitus   . Pneumonia 07/25/11    left  . Persistent atrial fibrillation   . Blood transfusion     w/hip operation  . History of stomach ulcers ~ 1951  . Renal artery stenosis 2006    renal artery stent  . Bladder cancer     Bladder Cancer local  . Cellulitis of left lower extremity   . Chronic diastolic congestive heart failure   . Normal coronary arteries Sept 2012    Past Surgical History  Procedure Laterality Date  . Peripheral arterial stent graft      2006 left anf right illiac stents Dr Deon Pilling  . Aortic valve replacement  01/07/2011   Procedure: AORTIC VALVE REPLACEMENT (AVR);  Surgeon: Grace Isaac, MD;  Location: [place];  Service: Open Heart Surgery;  Laterality: N/A;; magna-ease bovine 23m bioprosthesis  . Cardioversion  04/03/2011    Procedure: CARDIOVERSION;  Surgeon: DLeonie Man MD;  Location[place] [facility]  Service: Cardiovascular;  Laterality: N/A;  . Lower extremity angiogram  06/17/2011    diamondback orbital rotational and cutting balloon atherectomy of the prox R SFA  . Femur im nail  06/27/2011    Procedure: INTRAMEDULLARY (IM) NAIL FEMORAL;  Surgeon: KMarin Shutter MD;  Location: [facility];  Service: Orthopedics;  Laterality: Right;  . Tonsillectomy and adenoidectomy      "when I was a kid"  . Cholecystectomy    . Cataract extraction w/ intraocular lens  implant, bilateral  ~ 2007  . Renal artery stent  2006    "I believe"  . Cardiac catheterization  11/19/10    normal coronaries, mod AS, 75% l RAS    Family History  Problem Relation Age of Onset  . Heart disease Mother   . Cancer Brother     colon  . Stroke Father     Social History:  reports that he has been smoking Cigarettes.  He has a 150 pack-year smoking history. He has never used smokeless tobacco. He reports that he  drinks about 1.2 ounces of alcohol per week. He reports that he does not use illicit drugs.  Allergies:  Allergies  Allergen Reactions  . Aspirin Anaphylaxis, Shortness Of Breath and Rash    "broke out in white welts; red blotches neck and face; windpipe closing; ~ 1962"    Medications: I have reviewed the patient's current medications.  Results for orders placed during the hospital encounter of 12/11/13 (from the past 48 hour(s))  CBC WITH DIFFERENTIAL     Status: Abnormal   Collection Time    12/12/13 12:45 AM      Result Value Ref Range   WBC 12.5 (*) 4.0 - 10.5 K/uL   RBC 3.75 (*) 4.22 - 5.81 MIL/uL   Hemoglobin 11.9 (*) 13.0 - 17.0 g/dL   HCT 34.5 (*) 39.0 - 52.0 %   MCV 92.0  78.0 - 100.0 fL   MCH 31.7  26.0 - 34.0  pg   MCHC 34.5  30.0 - 36.0 g/dL   RDW 14.4  11.5 - 15.5 %   Platelets 79 (*) 150 - 400 K/uL   Comment: SPECIMEN CHECKED FOR CLOTS     REPEATED TO VERIFY     PLATELET COUNT CONFIRMED BY SMEAR   Neutrophils Relative % 85 (*) 43 - 77 %   Lymphocytes Relative 7 (*) 12 - 46 %   Monocytes Relative 8  3 - 12 %   Eosinophils Relative 0  0 - 5 %   Basophils Relative 0  0 - 1 %   Neutro Abs 10.6 (*) 1.7 - 7.7 K/uL   Lymphs Abs 0.9  0.7 - 4.0 K/uL   Monocytes Absolute 1.0  0.1 - 1.0 K/uL   Eosinophils Absolute 0.0  0.0 - 0.7 K/uL   Basophils Absolute 0.0  0.0 - 0.1 K/uL  BASIC METABOLIC PANEL     Status: Abnormal   Collection Time    12/12/13 12:45 AM      Result Value Ref Range   Sodium 141  137 - 147 mEq/L   Potassium 3.7  3.7 - 5.3 mEq/L   Chloride 99  96 - 112 mEq/L   CO2 27  19 - 32 mEq/L   Glucose, Bld 311 (*) 70 - 99 mg/dL   BUN 21  6 - 23 mg/dL   Creatinine, Ser 1.32  0.50 - 1.35 mg/dL   Calcium 9.0  8.4 - 10.5 mg/dL   GFR calc non Af Amer 46 (*) >90 mL/min   GFR calc Af Amer 53 (*) >90 mL/min   Comment: (NOTE)     The eGFR has been calculated using the CKD EPI equation.     This calculation has not been validated in all clinical situations.     eGFR's persistently <90 mL/min signify possible Chronic Kidney     Disease.   Anion gap 15  5 - 15  PROTIME-INR     Status: Abnormal   Collection Time    12/12/13 12:45 AM      Result Value Ref Range   Prothrombin Time 44.8 (*) 11.6 - 15.2 seconds   INR 4.73 (*) 0.00 - 1.49  GLUCOSE, CAPILLARY     Status: Abnormal   Collection Time    12/12/13  3:58 AM      Result Value Ref Range   Glucose-Capillary 280 (*) 70 - 99 mg/dL    Dg Chest 1 View  12/12/2013   CLINICAL DATA:  Preop for left hip fracture initial encounter  EXAM:  CHEST - 1 VIEW  COMPARISON:  08/27/2013  FINDINGS: Prominent left ventricular size. The patient is status post median sternotomy for left atrial appendage exclusion and aortic valve replacement. Aortic  contours are stable. Pulmonary hyperinflation consistent with COPD. There is no edema, consolidation, effusion, or pneumothorax.  IMPRESSION: 1.  No active disease. 2. Chronic and postoperative changes described above.   Electronically Signed   By: Jorje Guild M.D.   On: 12/12/2013 00:01   Dg Hip Complete Left  12/12/2013   CLINICAL DATA:  80 year old male post fall with left hip pain. Initial encounter.  EXAM: LEFT HIP - COMPLETE 2+ VIEW  COMPARISON:  None.  FINDINGS: Slightly separated 4 part left intertrochanteric fracture.  Remote right hip fracture treated with internal fixation.  Iliac stents in place.  IMPRESSION: Slightly separated 4 part left intertrochanteric fracture.   Electronically Signed   By: Chauncey Cruel M.D.   On: 12/12/2013 00:01    Review of Systems  Constitutional: Negative.   HENT: Negative.   Eyes: Negative.   Respiratory: Negative.   Gastrointestinal: Negative.   Genitourinary: Negative.   Musculoskeletal: Positive for falls and joint pain.  Skin: Negative.   Neurological: Negative.   Endo/Heme/Allergies: Bruises/bleeds easily (On Coumadin).  Psychiatric/Behavioral: Negative.    Blood pressure 146/45, pulse 72, temperature 98.2 F (36.8 C), temperature source Oral, resp. rate 20, height 6' (1.829 m), weight 83.1 kg (183 lb 3.2 oz), SpO2 100.00%. Physical Exam  Constitutional: He appears well-developed.  HENT:  Head: Normocephalic.  Eyes: EOM are normal.  Neck: Normal range of motion.  Cardiovascular: Intact distal pulses.   Respiratory: Effort normal.  GI: Soft.  Genitourinary:  Deferred  Musculoskeletal: He exhibits edema (Left hip) and tenderness (Left hip).  LLE N/V intact. Calf soft and non tender. Can plantar and dorsi flex left foot.  Neurological: He is alert.  Skin: Skin is warm and dry.  Psychiatric: His behavior is normal.    Assessment/Plan: Left Intertrochanteric fracture: Plan for surgery when medically stable Dr. Theda Sers to  discuss case with Dr. Alvan Dame. No surgery today Dr. Alvan Dame to revaluate tomorrow. Hold Coumadin Bucks Traction LLE 5lbs.  Bedrest NPO MN Heart healthy/carb modified diet today Continue other current care Medicine Following  Ronald [name] L 12/12/2013, 6:32 AM

## 2013-12-12 NOTE — ED Notes (Signed)
Ronald [name]: [phone] (c) pt son

## 2013-12-12 NOTE — Progress Notes (Signed)
Orthopedic Tech Progress Note Patient Details:  Ronald [name] 1922-03-13 [id number] Applied Buck's traction to LLE with five pounds weight.  Pulses, sensation, motion intact before and after application.  Capillary refill less than 2 seconds before and after application. Musculoskeletal Traction Type of Traction: Bucks Skin Traction Traction Location: LLE Traction Weight: 5 lbs    Darrol Poke 12/12/2013, 1:50 PM

## 2013-12-12 NOTE — ED Notes (Signed)
MD at bedside. 

## 2013-12-13 LAB — SURGICAL PCR SCREEN
MRSA, PCR: NEGATIVE
Staphylococcus aureus: POSITIVE — AB

## 2013-12-13 LAB — PROTIME-INR
INR: 4 — ABNORMAL HIGH (ref 0.00–1.49)
INR: 3.26 — ABNORMAL HIGH (ref 0.00–1.49)
PROTHROMBIN TIME: 33.5 s — AB (ref 11.6–15.2)
Prothrombin Time: 39.3 seconds — ABNORMAL HIGH (ref 11.6–15.2)

## 2013-12-13 LAB — GLUCOSE, CAPILLARY
GLUCOSE-CAPILLARY: 201 mg/dL — AB (ref 70–99)
GLUCOSE-CAPILLARY: 171 mg/dL — AB (ref 70–99)
GLUCOSE-CAPILLARY: 187 mg/dL — AB (ref 70–99)
Glucose-Capillary: 126 mg/dL — ABNORMAL HIGH (ref 70–99)

## 2013-12-13 LAB — URINALYSIS, ROUTINE W REFLEX MICROSCOPIC
Bilirubin Urine: NEGATIVE
Glucose, UA: NEGATIVE mg/dL
Hgb urine dipstick: NEGATIVE
Ketones, ur: NEGATIVE mg/dL
LEUKOCYTES UA: NEGATIVE
NITRITE: NEGATIVE
Protein, ur: NEGATIVE mg/dL
SPECIFIC GRAVITY, URINE: 1.012 (ref 1.005–1.030)
Urobilinogen, UA: 1 mg/dL (ref 0.0–1.0)
pH: 6 (ref 5.0–8.0)

## 2013-12-13 MED ORDER — SODIUM CHLORIDE 0.9 % IV SOLN
Freq: Once | INTRAVENOUS | Status: AC
  Administered 2013-12-13: 20:00:00 via INTRAVENOUS

## 2013-12-13 MED ORDER — INSULIN ASPART 100 UNIT/ML ~~LOC~~ SOLN: 0.0000 [IU] | SUBCUTANEOUS | Status: DC

## 2013-12-13 MED ORDER — FENTANYL CITRATE 0.05 MG/ML IJ SOLN
50.0000 ug | INTRAMUSCULAR | Status: DC | PRN
Start: 2013-12-13 — End: 2013-12-13

## 2013-12-13 MED ORDER — INSULIN ASPART 100 UNIT/ML ~~LOC~~ SOLN
0.0000 [IU] | Freq: Every day | SUBCUTANEOUS | Status: DC
  Administered 2013-12-15: 3 [IU] via SUBCUTANEOUS

## 2013-12-13 MED ORDER — PHYTONADIONE 5 MG PO TABS
5.0000 mg | ORAL_TABLET | Freq: Once | ORAL | Status: AC
  Administered 2013-12-13: 5 mg via ORAL
  Filled 2013-12-13 (×2): qty 1

## 2013-12-13 MED ORDER — INSULIN ASPART 100 UNIT/ML ~~LOC~~ SOLN: 0.0000 [IU] | Freq: Three times a day (TID) | SUBCUTANEOUS | Status: DC

## 2013-12-13 MED ORDER — GLIPIZIDE 2.5 MG HALF TABLET
2.5000 mg | ORAL_TABLET | Freq: Every day | ORAL | Status: DC
  Administered 2013-12-13 – 2013-12-15 (×3): 2.5 mg via ORAL
  Filled 2013-12-13 (×4): qty 1

## 2013-12-13 MED ORDER — GLIPIZIDE 10 MG PO TABS
10.0000 mg | ORAL_TABLET | Freq: Every day | ORAL | Status: DC
  Administered 2013-12-14 – 2013-12-16 (×3): 10 mg via ORAL
  Filled 2013-12-13 (×4): qty 1

## 2013-12-13 MED ORDER — INSULIN ASPART 100 UNIT/ML ~~LOC~~ SOLN
0.0000 [IU] | Freq: Three times a day (TID) | SUBCUTANEOUS | Status: DC
  Administered 2013-12-13: 3 [IU] via SUBCUTANEOUS
  Administered 2013-12-14 (×2): 2 [IU] via SUBCUTANEOUS
  Administered 2013-12-15 (×2): 5 [IU] via SUBCUTANEOUS
  Administered 2013-12-15 – 2013-12-16 (×2): 8 [IU] via SUBCUTANEOUS
  Administered 2013-12-16: 11 [IU] via SUBCUTANEOUS

## 2013-12-13 NOTE — Care Management Note (Addendum)
    Page 1 of 1   12/16/2013     12:56:48 PM CARE MANAGEMENT NOTE 12/16/2013  Patient:  Ronald [name], Ronald [name]   Account Number:  [id number]  Date Initiated:  12/13/2013  Documentation initiated by:  Dessa Phi  Subjective/Objective Assessment:   80 Y/O M ADMITTED W/L FEMUR FX.     Action/Plan:   FROM HOME.   Anticipated DC Date:  12/16/2013   Anticipated DC Plan:  [place]  CM consult      Choice offered to / List presented to:             Status of service:  Completed, signed off Medicare Important Message given?  YES (If response is "NO", the following Medicare IM given date fields will be blank) Date Medicare IM given:  12/14/2013 Medicare IM given by:  [facility] Date Additional Medicare IM given:  12/16/2013 Additional Medicare IM given by:  [facility]  Discharge Disposition:  [place]  Per UR Regulation:  Reviewed for med. necessity/level of care/duration of stay  If discussed at [place] of Stay Meetings, dates discussed:   12/16/2013    Comments:  12/16/13 Bowe Sidor RN,BSN NCM [phone] D/C SNF.  12/15/13 Alonni Heimsoth RN,BSN NCM [phone] POD#1 IM NAIL.PT-SNF.CSW FOLLOWING.  12/14/13 Montanna Mcbain RN,BSN NCM [phone] ORTHO SX TODAY.AWAIT PT RECOMMENDATIONS.ANTICIPATE SNF @ D/C.  12/13/13 Kesia Dalto RN,BSN NCM [phone] INR-4 AWAIT TILL THERAPEUTIC FOR ORTH SX.ANTICIPATE MAY NEED SNF @ D/C.

## 2013-12-13 NOTE — Progress Notes (Signed)
Nutrition Brief Note  Patient identified on the Malnutrition Screening Tool (MST) Report  Wt Readings from Last 15 Encounters:  12/13/13 196 lb 6.9 oz (89.1 kg)  04/11/13 183 lb 3.2 oz (83.1 kg)  12/24/12 192 lb (87.091 kg)  12/02/12 189 lb 8 oz (85.957 kg)  11/12/12 188 lb 9.6 oz (85.548 kg)  07/08/12 182 lb 6.4 oz (82.736 kg)  05/27/12 183 lb 9.6 oz (83.28 kg)  07/28/11 183 lb 3.2 oz (83.1 kg)  06/26/11 178 lb 9.2 oz (81 kg)  06/26/11 178 lb 9.2 oz (81 kg)  06/18/11 182 lb 1.6 oz (82.6 kg)  06/18/11 182 lb 1.6 oz (82.6 kg)  04/04/11 182 lb (82.555 kg)  04/03/11 180 lb (81.647 kg)  04/03/11 180 lb (81.647 kg)    Body mass index is 26.63 kg/(m^2). Patient meets criteria for Overweight based on current BMI.   Current diet order is Heart Healthy/Carb Mod, patient is consuming approximately 50-75% of meals at this time. Labs and medications reviewed.   Pt denied significant changes in appetite. Diet recall indicates pt consuming two - three meals daily, and is able to prepare meals for self. Pt weighs himself daily d/t hx of CHF and denied any recent changes in weight.  No nutrition interventions warranted at this time. If nutrition issues arise, please consult RD.   Atlee Abide MS RD LDN Clinical Dietitian [phone]

## 2013-12-13 NOTE — Progress Notes (Signed)
[name], male   DOB: [birth year], 80 y.o.   MRN: [id number]  Left intertrochanteric femur fracture Supra therapeutic INR on coumadin  INR 4. This am If to go to OR tomorrow will need to have this reversed to below 2 for this particular case  Consent order will be placed Will follow tomorrow labs to determine if able to go to the [place],  [phone]

## 2013-12-13 NOTE — Progress Notes (Addendum)
[name], male   DOB: October 15, 1922, 80 y.o.   MRN: [id number]  [facility] PROGRESS NOTE  Ronald [name] NTZ:[id number] DOB: 21-Feb-1923 DOA: 12/11/2013 PCP: Precious Reel, MD  Brief narrative: 80 year old male with a history of right hip fracture, atrial fibrillation, s/p valve replacement, on chronic coumadin who presented to [facility] ED 12/11/2013 status post fall at home. Patient sustained left hip fracture. Plan is for surgery 12/14/2013 however his INR is supratherapeutic. He has received total of 5 mg vitamin K to reverse INR.  Assessment/Plan:    Principal Problem: Intertrochanteric fracture of left femur status post mechanical fall  Patient is still supra therapeutic on Coumadin: 4.73 --> 4 this morning. Patient requires INR less than 2 for surgery.  He has been given total of 5 mg vitamin K. Repeat INR.  Continue pain management efforts with dilaudid IV every 4 hours PRN. Active Problems: Atrial fibrillation   Rate controlled with metoprolol 50 mg PO BID.   Supratherapeutic INR (coumadin on hold due to high INR and anticipation for surgery) Diabetes type II, no mention of complication or if controlled  Check A1c.  On SSI. Also takes glipizide 10 mg in am and 2.5 at bedtime; continue lantus 21 units daily.  Dyslipidemia  Continue statin therapy  HTN  Continue Benazepril, Lasix, Metoprolol   No reason to continue IVF and Lasix at the same time  Thrombocytopenia  Chronic, at baseline 80 - 100K  Stop heparin due to risk of bleeding.  Leukocytosis with low grade fever 99 F  Likely from the fracture, no clear infectious etiology  Will ask for UA, CXR is unremarkable   Defer ABX for now, repeat CBC in AM Anemia of chronic disease   Hg stable, no sings of bleeding, repeat CBC in AM BPH  Continue Cardura and Finasteride   DVT Prophylaxis:   on Coumadin at home, need to hold to get INR < 2 so that pt can have surgery   Code Status: Full.   Family Communication:  plan of care discussed with the patient Disposition Plan: Home when stable.   IV Access:   Peripheral IV Procedures and diagnostic studies:    Dg Chest 1 View  12/12/2013   No active disease. 2. Chronic and postoperative changes described above.    Dg Hip Complete Left  12/12/2013 Slightly separated 4 part left intertrochanteric fracture.    Medical Consultants:   Ortho  Other Consultants:   None Anti-Infectives:   None  Leisa Lenz, MD  [facility] Pager [phone]  If 7PM-7AM, please contact night-coverage [email or web address] Password [email or web address] 12/13/2013, 1:20 PM   LOS: 2 days    HPI/Subjective: No acute overnight events.  Objective: Filed Vitals:   12/13/13 0526 12/13/13 1222 12/13/13 1223 12/13/13 1315  BP: 139/57 140/60  138/47  Pulse: 80  84 73  Temp: 98.8 F (37.1 C)   99.3 F (37.4 C)  TempSrc: Oral   Oral  Resp: 20   20  Height:      Weight: 89.1 kg (196 lb 6.9 oz)     SpO2: 95%   97%    Intake/Output Summary (Last 24 hours) at 12/13/13 1320 Last data filed at 12/13/13 1300  Gross per 24 hour  Intake 2957.08 ml  Output   3275 ml  Net -317.92 ml    Exam:   General:  Pt is alert, follows commands appropriately, not in acute distress  Cardiovascular: Regular rate and rhythm, S1/S2, no murmurs  Respiratory:  Clear to auscultation bilaterally, no wheezing, no crackles, no rhonchi  Abdomen: Soft, non tender, non distended, bowel sounds present  Extremities: pulses DP and PT palpable bilaterally  Neuro: Grossly nonfocal  Data Reviewed: Basic Metabolic Panel:  Recent Labs Lab 12/12/13 0045  NA 141  K 3.7  CL 99  CO2 27  GLUCOSE 311*  BUN 21  CREATININE 1.32  CALCIUM 9.0   CBC:  Recent Labs Lab 12/12/13 0045  WBC 12.5*  NEUTROABS 10.6*  HGB 11.9*  HCT 34.5*  MCV 92.0  PLT 79*   CBG:  Recent Labs Lab 12/12/13 0358 12/13/13 0736 12/13/13 1159  GLUCAP 280* 126* 201*    SURGICAL PCR SCREEN      Status: Abnormal   Collection Time    12/13/13  5:35 AM      Result Value Ref Range Status   MRSA, PCR NEGATIVE  NEGATIVE Final   Staphylococcus aureus POSITIVE (*) NEGATIVE Final     Scheduled Meds: . benazepril  10 mg Oral Daily  . doxazosin  4 mg Oral Daily  . finasteride  5 mg Oral Daily  . fluticasone  1 spray Each Nare Daily  . furosemide  40 mg Oral BID  . glyBURIDE  10 mg Oral Q breakfast  . glyBURIDE  2.5 mg Oral Q supper  . insulin glargine  21 Units Subcutaneous q morning - 10a  . loratadine  10 mg Oral Daily  . metoprolol succinate  50 mg Oral BID  . potassium chloride SA  20 mEq Oral Daily  . simvastatin  20 mg Oral QPM  . traMADol  50 mg Oral BID  . Umeclidinium-Vilanterol  1 puff Inhalation Daily   Continuous Infusions: . sodium chloride 125 mL/hr at 12/13/13 [phone]

## 2013-12-13 NOTE — Progress Notes (Signed)
Inpatient Diabetes Program Recommendations  AACE/ADA: New Consensus Statement on Inpatient Glycemic Control (2013)  Target Ranges:  Prepandial:   less than 140 mg/dL      Peak postprandial:   less than 180 mg/dL (1-2 hours)      Critically ill patients:  140 - 180 mg/dL     Results for DEAARON, FULGHUM (MRN [id number]) as of 12/13/2013 08:57  Ref. Range 12/12/2013 03:58 12/13/2013 07:36  Glucose-Capillary Latest Range: 70-99 mg/dL 280 (H) 126 (H)     Admitted with Left Hip Fracture after fall.  History of DM2.   Home DM Meds: Glyburide 10 mg daily AM/ 2.5 mg daily PM       Lantus 19-21 units QAM  Current Orders: Lantus 21 units QAM      Glyburide 10 mg daily AM/ 2.5 mg daily PM    MD- Please add Novolog Moderate SSI tid ac + HS     Will follow Wyn Quaker RN, MSN, CDE Diabetes Coordinator Inpatient Diabetes Program Team Pager: [phone] (8a-10p)

## 2013-12-14 ENCOUNTER — Encounter (HOSPITAL_COMMUNITY): Payer: Medicare Other | Admitting: Anesthesiology

## 2013-12-14 ENCOUNTER — Inpatient Hospital Stay (HOSPITAL_COMMUNITY): Payer: Medicare Other | Admitting: Anesthesiology

## 2013-12-14 ENCOUNTER — Inpatient Hospital Stay (HOSPITAL_COMMUNITY): Payer: Medicare Other

## 2013-12-14 ENCOUNTER — Encounter (HOSPITAL_COMMUNITY)
Admission: EM | Disposition: A | Discharge status: Skilled Nursing Facility | Payer: Self-pay | Source: Home / Self Care | Attending: Internal Medicine

## 2013-12-14 HISTORY — PX: FEMUR IM NAIL: SHX1597

## 2013-12-14 LAB — GLUCOSE, CAPILLARY
GLUCOSE-CAPILLARY: 126 mg/dL — AB (ref 70–99)
GLUCOSE-CAPILLARY: 414 mg/dL — AB (ref 70–99)
Glucose-Capillary: 132 mg/dL — ABNORMAL HIGH (ref 70–99)
Glucose-Capillary: 135 mg/dL — ABNORMAL HIGH (ref 70–99)
Glucose-Capillary: 132 mg/dL — ABNORMAL HIGH (ref 70–99)
Glucose-Capillary: 180 mg/dL — ABNORMAL HIGH (ref 70–99)
Glucose-Capillary: 436 mg/dL — ABNORMAL HIGH (ref 70–99)

## 2013-12-14 LAB — CBC
HCT: 24.2 % — ABNORMAL LOW (ref 39.0–52.0)
HEMOGLOBIN: 8.4 g/dL — AB (ref 13.0–17.0)
MCH: 31.8 pg (ref 26.0–34.0)
MCHC: 34.7 g/dL (ref 30.0–36.0)
MCV: 91.7 fL (ref 78.0–100.0)
Platelets: 71 10*3/uL — ABNORMAL LOW (ref 150–400)
RBC: 2.64 MIL/uL — ABNORMAL LOW (ref 4.22–5.81)
RDW: 14.6 % (ref 11.5–15.5)
WBC: 10.6 10*3/uL — ABNORMAL HIGH (ref 4.0–10.5)

## 2013-12-14 LAB — PROTIME-INR
INR: 1.7 — ABNORMAL HIGH (ref 0.00–1.49)
INR: 1.89 — ABNORMAL HIGH (ref 0.00–1.49)
Prothrombin Time: 20.1 seconds — ABNORMAL HIGH (ref 11.6–15.2)
Prothrombin Time: 21.9 seconds — ABNORMAL HIGH (ref 11.6–15.2)

## 2013-12-14 LAB — BASIC METABOLIC PANEL
Anion gap: 11 (ref 5–15)
BUN: 18 mg/dL (ref 6–23)
CO2: 28 mEq/L (ref 19–32)
Calcium: 8.5 mg/dL (ref 8.4–10.5)
Chloride: 101 mEq/L (ref 96–112)
Creatinine, Ser: 1.12 mg/dL (ref 0.50–1.35)
GFR calc Af Amer: 65 mL/min — ABNORMAL LOW (ref >90)
GFR, EST NON AFRICAN AMERICAN: 56 mL/min — AB (ref >90)
Glucose, Bld: 127 mg/dL — ABNORMAL HIGH (ref 70–99)
Potassium: 3.1 mEq/L — ABNORMAL LOW (ref 3.7–5.3)
Sodium: 140 mEq/L (ref 137–147)

## 2013-12-14 LAB — PREPARE FRESH FROZEN PLASMA: Unit division: 0

## 2013-12-14 LAB — HEMOGLOBIN A1C
Hgb A1c MFr Bld: 8.1 % — ABNORMAL HIGH (ref <5.7)
Mean Plasma Glucose: 186 mg/dL — ABNORMAL HIGH (ref <117)

## 2013-12-14 LAB — PREPARE RBC (CROSSMATCH)

## 2013-12-14 SURGERY — INSERTION, INTRAMEDULLARY ROD, FEMUR
Anesthesia: General | Site: Hip | Laterality: Left

## 2013-12-14 MED ORDER — FERROUS SULFATE 325 (65 FE) MG PO TABS
325.0000 mg | ORAL_TABLET | Freq: Two times a day (BID) | ORAL | Status: DC
  Administered 2013-12-15 – 2013-12-16 (×3): 325 mg via ORAL
  Filled 2013-12-14 (×4): qty 1

## 2013-12-14 MED ORDER — LACTATED RINGERS IV SOLN
INTRAVENOUS | Status: DC | PRN
  Administered 2013-12-14: 14:00:00 via INTRAVENOUS

## 2013-12-14 MED ORDER — METOCLOPRAMIDE HCL 10 MG PO TABS: 5.0000 mg | ORAL_TABLET | Freq: Three times a day (TID) | ORAL | Status: DC | PRN

## 2013-12-14 MED ORDER — PHENYLEPHRINE HCL 10 MG/ML IJ SOLN
INTRAMUSCULAR | Status: AC
  Filled 2013-12-14: qty 1

## 2013-12-14 MED ORDER — LIDOCAINE HCL (CARDIAC) 20 MG/ML IV SOLN
INTRAVENOUS | Status: DC | PRN
  Administered 2013-12-14: 75 mg via INTRAVENOUS
  Administered 2013-12-14: 25 mg via INTRATRACHEAL

## 2013-12-14 MED ORDER — WARFARIN - PHARMACIST DOSING INPATIENT: Freq: Every day | Status: DC

## 2013-12-14 MED ORDER — FENTANYL CITRATE 0.05 MG/ML IJ SOLN
INTRAMUSCULAR | Status: DC | PRN
  Administered 2013-12-14: 100 ug via INTRAVENOUS
  Administered 2013-12-14: 50 ug via INTRAVENOUS
  Administered 2013-12-14: 100 ug via INTRAVENOUS

## 2013-12-14 MED ORDER — CEFAZOLIN SODIUM 1-5 GM-% IV SOLN
1.0000 g | Freq: Four times a day (QID) | INTRAVENOUS | Status: AC
  Administered 2013-12-14 – 2013-12-15 (×2): 1 g via INTRAVENOUS
  Filled 2013-12-14 (×2): qty 50

## 2013-12-14 MED ORDER — NEOSTIGMINE METHYLSULFATE 10 MG/10ML IV SOLN
INTRAVENOUS | Status: DC | PRN
  Administered 2013-12-14: 2 mg via INTRAVENOUS

## 2013-12-14 MED ORDER — HYDROMORPHONE HCL 1 MG/ML IJ SOLN
INTRAMUSCULAR | Status: AC
  Filled 2013-12-14: qty 1

## 2013-12-14 MED ORDER — METOPROLOL SUCCINATE ER 25 MG PO TB24
50.0000 mg | ORAL_TABLET | Freq: Two times a day (BID) | ORAL | Status: DC
  Administered 2013-12-14 – 2013-12-16 (×4): 50 mg via ORAL
  Filled 2013-12-14 (×4): qty 2

## 2013-12-14 MED ORDER — HYDROMORPHONE HCL 1 MG/ML IJ SOLN: 0.5000 mg | INTRAMUSCULAR | Status: DC | PRN

## 2013-12-14 MED ORDER — SODIUM CHLORIDE 0.9 % IV SOLN: Freq: Once | INTRAVENOUS | Status: DC

## 2013-12-14 MED ORDER — WARFARIN SODIUM 5 MG PO TABS: 5.0000 mg | ORAL_TABLET | Freq: Every day | ORAL | Status: DC

## 2013-12-14 MED ORDER — INSULIN ASPART 100 UNIT/ML ~~LOC~~ SOLN
7.0000 [IU] | Freq: Once | SUBCUTANEOUS | Status: AC
  Administered 2013-12-14: 7 [IU] via SUBCUTANEOUS

## 2013-12-14 MED ORDER — ONDANSETRON HCL 4 MG PO TABS: 4.0000 mg | ORAL_TABLET | Freq: Four times a day (QID) | ORAL | Status: DC | PRN

## 2013-12-14 MED ORDER — MAGNESIUM CITRATE PO SOLN
0.5000 | Freq: Once | ORAL | Status: AC | PRN
Start: 2013-12-14 — End: 2013-12-14

## 2013-12-14 MED ORDER — WARFARIN SODIUM 4 MG PO TABS
4.0000 mg | ORAL_TABLET | Freq: Once | ORAL | Status: AC
  Administered 2013-12-14: 4 mg via ORAL
  Filled 2013-12-14: qty 1

## 2013-12-14 MED ORDER — PHENOL 1.4 % MT LIQD: 1.0000 | OROMUCOSAL | Status: DC | PRN

## 2013-12-14 MED ORDER — PROPOFOL 10 MG/ML IV BOLUS
INTRAVENOUS | Status: DC | PRN
  Administered 2013-12-14: 110 mg via INTRAVENOUS

## 2013-12-14 MED ORDER — HYDROCODONE-ACETAMINOPHEN 5-325 MG PO TABS: 1.0000 | ORAL_TABLET | Freq: Four times a day (QID) | ORAL | Status: DC | PRN

## 2013-12-14 MED ORDER — ACETAMINOPHEN 325 MG PO TABS: 650.0000 mg | ORAL_TABLET | Freq: Four times a day (QID) | ORAL | Status: DC | PRN

## 2013-12-14 MED ORDER — CISATRACURIUM BESYLATE 20 MG/10ML IV SOLN
INTRAVENOUS | Status: AC
  Filled 2013-12-14: qty 10

## 2013-12-14 MED ORDER — EPHEDRINE SULFATE 50 MG/ML IJ SOLN
INTRAMUSCULAR | Status: DC | PRN
  Administered 2013-12-14: 5 mg via INTRAVENOUS

## 2013-12-14 MED ORDER — CEFAZOLIN SODIUM-DEXTROSE 2-3 GM-% IV SOLR: 2.0000 g | Freq: Once | INTRAVENOUS | Status: DC

## 2013-12-14 MED ORDER — HYDROMORPHONE HCL 1 MG/ML IJ SOLN
0.2500 mg | INTRAMUSCULAR | Status: DC | PRN
  Administered 2013-12-14 (×4): 0.25 mg via INTRAVENOUS

## 2013-12-14 MED ORDER — ACETAMINOPHEN 650 MG RE SUPP: 650.0000 mg | Freq: Four times a day (QID) | RECTAL | Status: DC | PRN

## 2013-12-14 MED ORDER — PHENYLEPHRINE HCL 10 MG/ML IJ SOLN
10.0000 mg | INTRAVENOUS | Status: DC | PRN
  Administered 2013-12-14: 10 ug/min via INTRAVENOUS

## 2013-12-14 MED ORDER — GLYCOPYRROLATE 0.2 MG/ML IJ SOLN
INTRAMUSCULAR | Status: DC | PRN
  Administered 2013-12-14: 0.2 mg via INTRAVENOUS

## 2013-12-14 MED ORDER — PROPOFOL 10 MG/ML IV BOLUS
INTRAVENOUS | Status: AC
  Filled 2013-12-14: qty 20

## 2013-12-14 MED ORDER — FENTANYL CITRATE 0.05 MG/ML IJ SOLN
25.0000 ug | INTRAMUSCULAR | Status: DC | PRN
  Administered 2013-12-14 (×2): 50 ug via INTRAVENOUS

## 2013-12-14 MED ORDER — MENTHOL 3 MG MT LOZG
1.0000 | LOZENGE | OROMUCOSAL | Status: DC | PRN
  Filled 2013-12-14: qty 9

## 2013-12-14 MED ORDER — DOCUSATE SODIUM 100 MG PO CAPS
100.0000 mg | ORAL_CAPSULE | Freq: Two times a day (BID) | ORAL | Status: DC
  Administered 2013-12-14 – 2013-12-16 (×4): 100 mg via ORAL
  Filled 2013-12-14 (×5): qty 1

## 2013-12-14 MED ORDER — SODIUM CHLORIDE 0.9 % IV SOLN
INTRAVENOUS | Status: DC
  Administered 2013-12-14: 18:00:00 via INTRAVENOUS
  Filled 2013-12-14 (×3): qty 1000

## 2013-12-14 MED ORDER — HYDROCODONE-ACETAMINOPHEN 5-325 MG PO TABS
1.0000 | ORAL_TABLET | Freq: Four times a day (QID) | ORAL | Status: DC | PRN
  Administered 2013-12-15: 2 via ORAL
  Filled 2013-12-14 (×2): qty 2

## 2013-12-14 MED ORDER — POLYETHYLENE GLYCOL 3350 17 G PO PACK
17.0000 g | PACK | Freq: Two times a day (BID) | ORAL | Status: DC
  Administered 2013-12-14 – 2013-12-16 (×4): 17 g via ORAL
  Filled 2013-12-14 (×5): qty 1

## 2013-12-14 MED ORDER — SODIUM CHLORIDE 0.9 % IV SOLN
INTRAVENOUS | Status: DC | PRN
  Administered 2013-12-14: 16:00:00 via INTRAVENOUS

## 2013-12-14 MED ORDER — FLUTICASONE PROPIONATE 50 MCG/ACT NA SUSP: 1.0000 | Freq: Every day | NASAL | Status: DC

## 2013-12-14 MED ORDER — ROCURONIUM BROMIDE 100 MG/10ML IV SOLN
INTRAVENOUS | Status: DC | PRN
  Administered 2013-12-14: 20 mg via INTRAVENOUS

## 2013-12-14 MED ORDER — ENOXAPARIN SODIUM 40 MG/0.4ML ~~LOC~~ SOLN
40.0000 mg | SUBCUTANEOUS | Status: DC
  Administered 2013-12-15 – 2013-12-16 (×2): 40 mg via SUBCUTANEOUS
  Filled 2013-12-14 (×2): qty 0.4

## 2013-12-14 MED ORDER — CEFAZOLIN SODIUM-DEXTROSE 2-3 GM-% IV SOLR
INTRAVENOUS | Status: AC
  Filled 2013-12-14: qty 50

## 2013-12-14 MED ORDER — FENTANYL CITRATE 0.05 MG/ML IJ SOLN
INTRAMUSCULAR | Status: AC
  Filled 2013-12-14: qty 5

## 2013-12-14 MED ORDER — METHOCARBAMOL 1000 MG/10ML IJ SOLN
500.0000 mg | Freq: Once | INTRAVENOUS | Status: AC
  Administered 2013-12-14: 500 mg via INTRAVENOUS
  Filled 2013-12-14: qty 5

## 2013-12-14 MED ORDER — ONDANSETRON HCL 4 MG/2ML IJ SOLN: 4.0000 mg | Freq: Four times a day (QID) | INTRAMUSCULAR | Status: DC | PRN

## 2013-12-14 MED ORDER — METOCLOPRAMIDE HCL 5 MG/ML IJ SOLN: 5.0000 mg | Freq: Three times a day (TID) | INTRAMUSCULAR | Status: DC | PRN

## 2013-12-14 MED ORDER — FENTANYL CITRATE 0.05 MG/ML IJ SOLN
INTRAMUSCULAR | Status: AC
  Filled 2013-12-14: qty 2

## 2013-12-14 MED ORDER — ALUM & MAG HYDROXIDE-SIMETH 200-200-20 MG/5ML PO SUSP: 30.0000 mL | ORAL | Status: DC | PRN

## 2013-12-14 MED ORDER — CEFAZOLIN SODIUM-DEXTROSE 2-3 GM-% IV SOLR
INTRAVENOUS | Status: DC | PRN
Start: 2013-12-14 — End: 2013-12-14
  Administered 2013-12-14: 2 g via INTRAVENOUS

## 2013-12-14 MED ORDER — 0.9 % SODIUM CHLORIDE (POUR BTL) OPTIME
TOPICAL | Status: DC | PRN
  Administered 2013-12-14: 1000 mL

## 2013-12-14 SURGICAL SUPPLY — 39 items
ADH SKN CLS APL DERMABOND .7 (GAUZE/BANDAGES/DRESSINGS) ×1
BAG SPEC THK2 15X12 ZIP CLS (MISCELLANEOUS) ×1
BAG ZIPLOCK 12X15 (MISCELLANEOUS) ×3 IMPLANT
BIT DRILL CANN LG 4.3MM (BIT) IMPLANT
BNDG GAUZE ELAST 4 BULKY (GAUZE/BANDAGES/DRESSINGS) ×1 IMPLANT
DERMABOND ADVANCED (GAUZE/BANDAGES/DRESSINGS) ×2
DERMABOND ADVANCED .7 DNX12 (GAUZE/BANDAGES/DRESSINGS) IMPLANT
DRAPE C-ARM 42X120 X-RAY (DRAPES) ×2 IMPLANT
DRAPE STERI IOBAN 125X83 (DRAPES) ×1 IMPLANT
DRILL BIT CANN LG 4.3MM (BIT) ×3
DRSG AQUACEL AG ADV 3.5X 4 (GAUZE/BANDAGES/DRESSINGS) ×2 IMPLANT
DRSG AQUACEL AG ADV 3.5X 6 (GAUZE/BANDAGES/DRESSINGS) ×3 IMPLANT
DRSG PAD ABDOMINAL 8X10 ST (GAUZE/BANDAGES/DRESSINGS) ×4 IMPLANT
DURAPREP 26ML APPLICATOR (WOUND CARE) ×3 IMPLANT
ELECT REM PT RETURN 9FT ADLT (ELECTROSURGICAL) ×3
ELECTRODE REM PT RTRN 9FT ADLT (ELECTROSURGICAL) ×1 IMPLANT
GAUZE SPONGE 4X4 12PLY STRL (GAUZE/BANDAGES/DRESSINGS) ×1 IMPLANT
GLOVE BIOGEL PI IND STRL 7.5 (GLOVE) ×1 IMPLANT
GLOVE BIOGEL PI IND STRL 8.5 (GLOVE) ×1 IMPLANT
GLOVE BIOGEL PI INDICATOR 7.5 (GLOVE) ×2
GLOVE BIOGEL PI INDICATOR 8.5 (GLOVE) ×2
GLOVE ECLIPSE 8.0 STRL XLNG CF (GLOVE) IMPLANT
GLOVE ORTHO TXT STRL SZ7.5 (GLOVE) ×6 IMPLANT
GLOVE SURG ORTHO 8.0 STRL STRW (GLOVE) ×3 IMPLANT
GOWN SPEC L3 XXLG W/TWL (GOWN DISPOSABLE) ×6 IMPLANT
GOWN STRL REUS W/TWL LRG LVL3 (GOWN DISPOSABLE) ×3 IMPLANT
GUIDEPIN 3.2X17.5 THRD DISP (PIN) ×2 IMPLANT
KIT BASIN OR (CUSTOM PROCEDURE TRAY) ×3 IMPLANT
MANIFOLD NEPTUNE II (INSTRUMENTS) ×3 IMPLANT
NAIL HIP FRACT 130D 11X180 (Screw) ×2 IMPLANT
PACK GENERAL/GYN (CUSTOM PROCEDURE TRAY) ×3 IMPLANT
POSITIONER SURGICAL ARM (MISCELLANEOUS) ×3 IMPLANT
SCREW BONE CORTICAL 5.0X42 (Screw) ×2 IMPLANT
SCREW LAG 10.5MMX105MM HFN (Screw) ×2 IMPLANT
SUT VIC AB 1 CT1 27 (SUTURE) ×3
SUT VIC AB 1 CT1 27XBRD ANTBC (SUTURE) ×1 IMPLANT
SUT VIC AB 2-0 CT1 27 (SUTURE) ×9
SUT VIC AB 2-0 CT1 27XBRD (SUTURE) ×2 IMPLANT
TOWEL OR 17X26 10 PK STRL BLUE (TOWEL DISPOSABLE) ×4 IMPLANT

## 2013-12-14 NOTE — Anesthesia Postprocedure Evaluation (Signed)
  Anesthesia Post-op Note  Patient: Ronald [name]  Procedure(s) Performed: Procedure(s) (LRB): INTRAMEDULLARY (IM) NAIL FEMORAL (Left)  Patient Location: PACU  Anesthesia Type: General  Level of Consciousness: awake and alert   Airway and Oxygen Therapy: Patient Spontanous Breathing  Post-op Pain: mild  Post-op Assessment: Post-op Vital signs reviewed, Patient's Cardiovascular Status Stable, Respiratory Function Stable, Patent Airway and No signs of Nausea or vomiting  Last Vitals:  Filed Vitals:   12/14/13 1730  BP: 150/53  Pulse: 75  Temp: 36.6 C  Resp: 15    Post-op Vital Signs: stable   Complications: No apparent anesthesia complications. EBL 250 cc. One unit pRBC given. Patient normally takes lasix 40 mg PO BID and had dose this AM. Will get PM dose tonight. No respiratory complaints.

## 2013-12-14 NOTE — Anesthesia Preprocedure Evaluation (Addendum)
Anesthesia Evaluation  Patient identified by MRN, date of birth, ID band Patient awake    Reviewed: Allergy & Precautions, H&P , NPO status , Patient's Chart, lab work & pertinent test results  History of Anesthesia Complications Negative for: history of anesthetic complications  Airway Mallampati: II TM Distance: >3 FB Neck ROM: Full    Dental no notable dental hx.    Pulmonary shortness of breath, pneumonia -, COPD COPD inhaler, Recent URI , Current Smoker,  breath sounds clear to auscultation  Pulmonary exam normal       Cardiovascular hypertension, Pt. on medications and Pt. on home beta blockers + Peripheral Vascular Disease and +CHF + dysrhythmias Atrial Fibrillation + Valvular Problems/Murmurs (hx of AS s/p AVR) AS Rhythm:Regular Rate:Normal  ECG: Afib, LBBB  CXR: No acute disease.   Neuro/Psych PSYCHIATRIC DISORDERS Depression negative neurological ROS     GI/Hepatic negative GI ROS, Neg liver ROS,   Endo/Other  diabetes, Type 2, Oral Hypoglycemic Agents, Insulin Dependent  Renal/GU CRFRenal disease  negative genitourinary   Musculoskeletal negative musculoskeletal ROS (+)   Abdominal   Peds negative pediatric ROS (+)  Hematology  (+) anemia , Platelets 71K. H/O Thrombocytopenia.   Anesthesia Other Findings   Reproductive/Obstetrics negative OB ROS                          Anesthesia Physical Anesthesia Plan  ASA: IV  Anesthesia Plan: General   Post-op Pain Management:    Induction: Intravenous  Airway Management Planned: Oral ETT  Additional Equipment:   Intra-op Plan:   Post-operative Plan: Extubation in OR  Informed Consent: I have reviewed the patients History and Physical, chart, labs and discussed the procedure including the risks, benefits and alternatives for the proposed anesthesia with the patient or authorized representative who has indicated his/her  understanding and acceptance.   Dental advisory given  Plan Discussed with: CRNA  Anesthesia Plan Comments: (INR 1.7 today. Platelets 71 K. Plan general.)       Anesthesia Quick Evaluation

## 2013-12-14 NOTE — Progress Notes (Signed)
Patient with left intertrochanteric hip fracture. Currently in OR.  Chart reviewed. We will reassess next am.  Velvet Bathe

## 2013-12-14 NOTE — Progress Notes (Signed)
ANTICOAGULATION CONSULT NOTE - Initial Consult  Pharmacy Consult for Warfarin Indication: AFib/AVR  Allergies  Allergen Reactions  . Aspirin Anaphylaxis, Shortness Of Breath and Rash    "broke out in white welts; red blotches neck and face; windpipe closing; ~ 1962"    Patient Measurements: Height: 6' (182.9 cm) Weight: 196 lb 6.9 oz (89.1 kg) IBW/kg (Calculated) : 77.6  Vital Signs: Temp: 97.8 F (36.6 C) (10/20 1730) Temp Source: Oral (10/20 1251) BP: 150/53 mmHg (10/20 1730) Pulse Rate: 75 (10/20 1730)  Labs:  Recent Labs  12/12/13 0045  12/13/13 1641 12/14/13 0030 12/14/13 0435  HGB 11.9*  --   --   --  8.4*  HCT 34.5*  --   --   --  24.2*  PLT 79*  --   --   --  71*  LABPROT 44.8*  < > 33.5* 21.9* 20.1*  INR 4.73*  < > 3.26* 1.89* 1.70*  CREATININE 1.32  --   --   --  1.12  < > = values in this interval not displayed.  Estimated Creatinine Clearance: 49.1 ml/min (by C-G formula based on Cr of 1.12).   Medical History: Past Medical History  Diagnosis Date  . AS (aortic stenosis)     bovine aortic valve replacement 01/07/11  . HTN (hypertension)   . Thrombocytopenia due to drugs     seen by Dr Inda Merlin plts 114000 no rx  . Peripheral vascular disease very poor circulation legs and feet ... stents right and left legs... done in dr j. Gwenlyn Found 's office.   . Depression wife died 4 years ago.    Marland Kitchen COPD (chronic obstructive pulmonary disease)   . Shortness of breath   . Recurrent upper respiratory infection (URI)     sinusitis  . Claudication in peripheral vascular disease 06/17/2011  . S/P angioplasty with stent, diamond back rotational athrectomy Prox. Rt. SFA 06/17/2011 06/17/2011  . Neuropathy due to secondary diabetes   . Type II diabetes mellitus   . Pneumonia 07/25/11    left  . Persistent atrial fibrillation   . Blood transfusion     w/hip operation  . History of stomach ulcers ~ 1951  . Renal artery stenosis 2006    renal artery stent  . Bladder  cancer     Bladder Cancer local  . Cellulitis of left lower extremity   . Chronic diastolic congestive heart failure   . Normal coronary arteries Sept 2012    Medications:  Scheduled:  . benazepril  10 mg Oral Daily  .  ceFAZolin (ANCEF) IV  1 g Intravenous Q6H  . docusate sodium  100 mg Oral BID  . doxazosin  4 mg Oral Daily  . [START ON 12/15/2013] enoxaparin (LOVENOX) injection  40 mg Subcutaneous Q24H  . fentaNYL      . [START ON 12/15/2013] ferrous sulfate  325 mg Oral BID WC  . finasteride  5 mg Oral Daily  . fluticasone  1 spray Each Nare Daily  . furosemide  40 mg Oral BID  . glipiZIDE  10 mg Oral QAC breakfast  . glipiZIDE  2.5 mg Oral QHS  . HYDROmorphone      . insulin aspart  0-15 Units Subcutaneous TID WC  . insulin aspart  0-5 Units Subcutaneous QHS  . insulin glargine  21 Units Subcutaneous q morning - 10a  . loratadine  10 mg Oral Daily  . metoprolol succinate  50 mg Oral BID  . polyethylene glycol  17 g Oral BID  . potassium chloride SA  20 mEq Oral Daily  . simvastatin  20 mg Oral QPM  . sodium chloride  3 mL Intravenous Q12H  . traMADol  50 mg Oral BID  . Umeclidinium-Vilanterol  1 puff Inhalation Daily   Infusions:  . sodium chloride 0.9 % 1,000 mL with potassium chloride 10 mEq infusion      Assessment:  80 yr male with history of hip fracture.  Admitted on 10/18 s/p fall and sustaining left hip fracture.  On warfarin PTA for AVR and AFib  PTA on Warfarin 2.5mg  daily except 5mg  on Tue/Thur - last dose unknown per Med Rec.  INR upon admission = 4.73 (10/18). Warf placed on hold for pending surgery  INR =  4.00 (10/19) and Vit K 5mg  po x 1 given  INR = 1.7 (10/20)  S/P ORIF of left hip  Warfarin to resume tonight per pharmacy dosing  Goal of Therapy:  INR 2-3    Plan:   Warfarin 4mg  po x 1 tonight  On Lovenox 40mg  sq daily per MD dosing (to begin 101/21)  Check daily PT/INR  Daryel Kenneth, Toribio Harbour, PharmD 12/14/2013,5:54 PM

## 2013-12-14 NOTE — Clinical Documentation Improvement (Signed)
Possible Clinical Conditions?    Diabetes Type 2: _______Controlled or Uncontrolled  Manifestations:  _______DM retinopathy  _______DM PVD _______DM neuropathy   _______DM nephropathy  _______Other Condition _______Cannot Clinically determine    Risk Factors: Type II diabetes mellitus, neuropathy due to secondary diabetes per 10/18 progress notes.  Diagnostics: 10/19:  HgbA1c: 8.1. 10/19:  Mean plasma glucose: 186.   Thank You, Theron Arista, Clinical Documentation Specialist:  [phone]  [place] Information Management

## 2013-12-14 NOTE — Progress Notes (Signed)
80 yo male with new left intertrochanteric hip fracture  INR normalized to less than 2    NPO last night  Ready for OR today to fix left hip  Consent ordered and signed

## 2013-12-14 NOTE — Transfer of Care (Signed)
Immediate Anesthesia Transfer of Care Note  Patient: Ronald [name]  Procedure(s) Performed: Procedure(s): INTRAMEDULLARY (IM) NAIL FEMORAL (Left)  Patient Location: PACU  Anesthesia Type:General  Level of Consciousness: awake, alert , patient cooperative and responds to stimulation  Airway & Oxygen Therapy: Patient Spontanous Breathing and Patient connected to face mask oxygen  Post-op Assessment: Report given to PACU RN, Post -op Vital signs reviewed and stable and Patient moving all extremities X 4  Post vital signs: stable  Complications: No apparent anesthesia complications

## 2013-12-14 NOTE — Brief Op Note (Signed)
12/11/2013 - 12/14/2013  3:22 PM  PATIENT:  Ronald [name]  80 y.o. male  PRE-OPERATIVE DIAGNOSIS:  left intertrochanteric fracture  POST-OPERATIVE DIAGNOSIS:  left hip intertrochanteric femur fracture  PROCEDURE:  Procedure(s): INTRAMEDULLARY (IM) NAIL FEMORAL (Left), ORIF left hip  SURGEON:  Surgeon(s) and Role:    * Mauri Pole, MD - Primary  PHYSICIAN ASSISTANT: Danae Orleans, PA-C  ANESTHESIA:   general  EBL:  Total I/O In: -  Out: 500 [Urine:250; Blood:250]  BLOOD ADMINISTERED: 1 unit PRBC given due to pre-op Hgb of 8.4  DRAINS: none   LOCAL MEDICATIONS USED:  NONE  SPECIMEN:  No Specimen  DISPOSITION OF SPECIMEN:  N/A  COUNTS:  YES  TOURNIQUET:  * No tourniquets in log *  DICTATION: .Other Dictation: Dictation Number [id number]  PLAN OF CARE: Admit to inpatient   PATIENT DISPOSITION:  PACU - hemodynamically stable.   Delay start of Pharmacological VTE agent (>24hrs) due to surgical blood loss or risk of bleeding: no

## 2013-12-15 ENCOUNTER — Encounter (HOSPITAL_COMMUNITY): Payer: Self-pay | Admitting: Orthopedic Surgery

## 2013-12-15 DIAGNOSIS — D72829 Elevated white blood cell count, unspecified: Secondary | ICD-10-CM

## 2013-12-15 DIAGNOSIS — I10 Essential (primary) hypertension: Secondary | ICD-10-CM

## 2013-12-15 LAB — BASIC METABOLIC PANEL
ANION GAP: 10 (ref 5–15)
BUN: 19 mg/dL (ref 6–23)
CHLORIDE: 97 meq/L (ref 96–112)
CO2: 28 meq/L (ref 19–32)
Calcium: 8 mg/dL — ABNORMAL LOW (ref 8.4–10.5)
Creatinine, Ser: 1.1 mg/dL (ref 0.50–1.35)
GFR calc Af Amer: 67 mL/min — ABNORMAL LOW (ref >90)
GFR calc non Af Amer: 57 mL/min — ABNORMAL LOW (ref >90)
Glucose, Bld: 288 mg/dL — ABNORMAL HIGH (ref 70–99)
Potassium: 3.3 mEq/L — ABNORMAL LOW (ref 3.7–5.3)
Sodium: 135 mEq/L — ABNORMAL LOW (ref 137–147)

## 2013-12-15 LAB — CBC
HEMATOCRIT: 23.7 % — AB (ref 39.0–52.0)
Hemoglobin: 8.3 g/dL — ABNORMAL LOW (ref 13.0–17.0)
MCH: 31.3 pg (ref 26.0–34.0)
MCHC: 35 g/dL (ref 30.0–36.0)
MCV: 89.4 fL (ref 78.0–100.0)
Platelets: 77 10*3/uL — ABNORMAL LOW (ref 150–400)
RBC: 2.65 MIL/uL — AB (ref 4.22–5.81)
RDW: 15.7 % — ABNORMAL HIGH (ref 11.5–15.5)
WBC: 11.5 10*3/uL — AB (ref 4.0–10.5)

## 2013-12-15 LAB — PROTIME-INR
INR: 1.46 (ref 0.00–1.49)
Prothrombin Time: 17.8 seconds — ABNORMAL HIGH (ref 11.6–15.2)

## 2013-12-15 LAB — GLUCOSE, CAPILLARY
GLUCOSE-CAPILLARY: 243 mg/dL — AB (ref 70–99)
Glucose-Capillary: 317 mg/dL — ABNORMAL HIGH (ref 70–99)
Glucose-Capillary: 290 mg/dL — ABNORMAL HIGH (ref 70–99)
Glucose-Capillary: 327 mg/dL — ABNORMAL HIGH (ref 70–99)
Glucose-Capillary: 253 mg/dL — ABNORMAL HIGH (ref 70–99)

## 2013-12-15 MED ORDER — DIPHENHYDRAMINE HCL 25 MG PO CAPS: 25.0000 mg | ORAL_CAPSULE | Freq: Every evening | ORAL | Status: DC | PRN

## 2013-12-15 MED ORDER — ZOLPIDEM TARTRATE 5 MG PO TABS
5.0000 mg | ORAL_TABLET | Freq: Every evening | ORAL | Status: DC | PRN
  Administered 2013-12-16: 5 mg via ORAL
  Filled 2013-12-15: qty 1

## 2013-12-15 MED ORDER — WARFARIN SODIUM 5 MG PO TABS
5.0000 mg | ORAL_TABLET | Freq: Once | ORAL | Status: AC
  Administered 2013-12-15: 5 mg via ORAL
  Filled 2013-12-15: qty 1

## 2013-12-15 NOTE — Progress Notes (Signed)
Inpatient Diabetes Program Recommendations  AACE/ADA: New Consensus Statement on Inpatient Glycemic Control (2013)  Target Ranges:  Prepandial:   less than 140 mg/dL      Peak postprandial:   less than 180 mg/dL (1-2 hours)      Critically ill patients:  140 - 180 mg/dL     Results for Ronald [name], Ronald [name] (MRN [id number]) as of 12/15/2013 13:38  Ref. Range 12/14/2013 07:43 12/14/2013 11:41 12/14/2013 14:05 12/14/2013 16:16 12/14/2013 17:59 12/14/2013 20:57 12/14/2013 23:20  Glucose-Capillary Latest Range: 70-99 mg/dL 132 (H) 135 (H) 126 (H) 132 (H) 180 (H) 436 (H) 414 (H)    Results for Ronald [name], Ronald [name] (MRN [id number]) as of 12/15/2013 13:38  Ref. Range 12/15/2013 02:14 12/15/2013 04:25 12/15/2013 07:37 12/15/2013 12:00  Glucose-Capillary Latest Range: 70-99 mg/dL 317 (H) 290 (H) 243 (H) 327 (H)     Patient POD #1 for Ortho surgery for Hip fracture.  Having significant glucose elevations.  Note that patient has NOT received any steroids since admission.  Current DM Medication Orders:  Lantus 21 units QAM         Novolog Moderate SSI tid ac + HS         Glipizide 10 mg QAM/ 2.5 mg QPM  Eating 65-100% of meals.    MD- Please consider the following insulin adjustments if patient continues to have significant glucose elevations:  1. Increase Lantus to 25 units QAM 2. Add Novolog Meal Coverage- Novolog 4 units tid with meals    Will follow Wyn Quaker RN, MSN, CDE Diabetes Coordinator Inpatient Diabetes Program Team Pager: [phone] (8a-10p)

## 2013-12-15 NOTE — Evaluation (Signed)
Occupational Therapy Evaluation Patient Details Name: Ronald [name] MRN: [id number] DOB: 10-15-1922 Today's Date: 12/15/2013    History of Present Illness 80 yo male s/p ORIF L IT femur 12/14/13. Hx of aortic stenosis, HTN, COPD, DM, Afib, neuropathy, bladder cancer, CHF, R femur IM nail. Pt is from home alone.    Clinical Impression   Pt was admitted for the above. At baseline, he is independent with adls.  He currently needs A x 2 for transfers and LB adls.  Pt will benefit from SNF.  He wants to return home:  If he does this, he needs 24/7 assistance.  Goals in acute are for min to mod a for ADLs    Follow Up Recommendations  SNF unless pt can arrange 24/7 A at home   Equipment Recommendations  3 in 1 bedside comode    Recommendations for Other Services       Precautions / Restrictions Precautions Precautions: Fall Restrictions LLE Weight Bearing: Weight bearing as tolerated      Mobility Bed Mobility           Sit to supine: Mod assist;+2 for safety/equipment   General bed mobility comments: assisted bil LEs onto bed:  +2 for safety.  Initially when bending to assist legs, pt scooted forward on bed--near edge  Transfers   Equipment used: Rolling walker (2 wheeled) Transfers: Sit to/from Stand Sit to Stand: Mod assist;+2 physical assistance;+2 safety/equipment;From elevated surface         General transfer comment: assist to rise, shift forward and steady.  Cues for UE/LE placement    Balance           Standing balance support: Bilateral upper extremity supported Standing balance-Leahy Scale: Poor                              ADL Overall ADL's : Needs assistance/impaired             Lower Body Bathing: +2 for physical assistance;Maximal assistance;Sit to/from stand       Lower Body Dressing: +2 for physical assistance;Total assistance;Sit to/from stand   Toilet Transfer: +2 for physical assistance;Moderate  assistance;Stand-pivot (recliner to bed)             General ADL Comments: pt is able to perform UB adls with set up.  He did not use AE prior to this admission, but would benefit from it for adls.  Pt is needing support of bil UEs on walker due to pain.  Pt was able to pivot back to bed with cues.  Pt needs increased time for transfers.  He had 2/4 dyspnea after SPT to bed     Vision                     Perception     Praxis      Pertinent Vitals/Pain Pain Assessment: Faces Pain Score: 8  (weight bearing) Faces Pain Scale: Hurts whole lot Pain Location: LLE     Hand Dominance     Extremity/Trunk Assessment             Communication Communication Communication: HOH   Cognition Arousal/Alertness: Awake/alert Behavior During Therapy: WFL for tasks assessed/performed Overall Cognitive Status: Within Functional Limits for tasks assessed                     General Comments       Exercises  Shoulder Instructions      Home Living                                   Additional Comments: pt wants home.  Will need snf unless 24/7      Prior Functioning/Environment Level of Independence: Independent             OT Diagnosis: Generalized weakness;Acute pain   OT Problem List: Decreased strength;Decreased activity tolerance;Impaired balance (sitting and/or standing);Decreased knowledge of use of DME or AE;Pain;Cardiopulmonary status limiting activity   OT Treatment/Interventions: Self-care/ADL training;DME and/or AE instruction;Balance training;Patient/family education    OT Goals(Current goals can be found in the care plan section) Acute Rehab OT Goals Patient Stated Goal: home. OT Goal Formulation: With patient Time For Goal Achievement: 12/22/13 Potential to Achieve Goals: Good ADL Goals Pt Will Perform Lower Body Bathing: with min assist;with adaptive equipment;sit to/from stand Pt Will Perform Lower Body Dressing:  with mod assist;with adaptive equipment;sit to/from stand Pt Will Transfer to Toilet: with min assist;stand pivot transfer;bedside commode Pt Will Perform Toileting - Clothing Manipulation and hygiene: with min assist;sit to/from stand  OT Frequency: Min 2X/week   Barriers to D/C:            Co-evaluation              End of Session    Activity Tolerance: Patient limited by fatigue;Patient limited by pain Patient left: in bed;with call bell/phone within reach   Time: 6294-7654 OT Time Calculation (min): 29 min Charges:  OT General Charges $OT Visit: 1 Procedure OT Evaluation $Initial OT Evaluation Tier I: 1 Procedure OT Treatments $Self Care/Home Management : 8-22 mins $Therapeutic Activity: 8-22 mins G-Codes:    Diamond Jentz 12/23/13, 5:23 PM Lesle Chris, OTR/L [phone] Dec 23, 2013

## 2013-12-15 NOTE — Progress Notes (Signed)
[name], male   DOB: 1922-03-30, 80 y.o.   MRN: [id number]  [facility] PROGRESS NOTE  JEMARIO POITRAS TGG:[id number] DOB: 1922-03-08 DOA: 12/11/2013 PCP: Precious Reel, MD  Brief narrative: 80 year old male with a history of right hip fracture, atrial fibrillation, s/p valve replacement, on chronic coumadin who presented to [facility] ED 12/11/2013 status post fall at home. Patient sustained left hip fracture. S/p hip surgery and now waiting SNF.  Assessment/Plan:    Principal Problem: Intertrochanteric fracture of left femur status post mechanical fall  S/P hip surgery on 10/20  Ok to resume coumadin per Ortho  Per PT SNF  Will follow further rec's  Atrial fibrillation   Rate controlled with metoprolol 50 mg PO BID.   Coumadin resumed  Diabetes type II, no mention of complication or if controlled  A1c 8.1  On SSI and lantus  Continue low carb diet  Dyslipidemia  Continue statin therapy   HTN  Continue Benazepril, Lasix, Metoprolol   Thrombocytopenia  Chronic, at baseline 80 - 100K  No active bleeding appreciated  Leukocytosis with low grade fever 99 F  Likely from the fracture, no clear infectious etiology  CXR is unremarkable and UA w/o signs of UTI  Defer ABX for now, repeat CBC in AM and follow WBC's trend  Anemia of chronic disease with superimposed ABLA   Hg stable at 8.4, no sings of bleeding  repeat CBC in AM  Will start iron supplementation   BPH  Continue Cardura and Finasteride    DVT Prophylaxis:   Resumed coumadin   Code Status: Full.  Family Communication:  plan of care discussed with the patient Disposition Plan: Home when stable.   IV Access:   Peripheral IV Procedures and diagnostic studies:    Dg Chest 1 View  12/12/2013   No active disease. 2. Chronic and postoperative changes described above.    Dg Hip Complete Left  12/12/2013 Slightly separated 4 part left intertrochanteric fracture.    Medical  Consultants:   Ortho  Other Consultants:   None Anti-Infectives:   None  Barton Dubois, MD  [facility] Pager [phone]  If 7PM-7AM, please contact night-coverage [email or web address] Password [email or web address] 12/15/2013, 7:30 PM   LOS: 4 days    HPI/Subjective: No acute overnight events.  Objective: Filed Vitals:   12/15/13 0223 12/15/13 0426 12/15/13 0605 12/15/13 1320  BP: 139/45  125/62 109/47  Pulse: 97  100 97  Temp: 98.9 F (37.2 C)  97.8 F (36.6 C) 97.9 F (36.6 C)  TempSrc: Oral  Oral Oral  Resp: 20  20 18   Height:      Weight:  87 kg (191 lb 12.8 oz)    SpO2: 100%  100% 97%    Intake/Output Summary (Last 24 hours) at 12/15/13 1930 Last data filed at 12/15/13 1700  Gross per 24 hour  Intake   1780 ml  Output   2000 ml  Net   -220 ml    Exam:   General:  Pt is alert, follows commands appropriately, not in acute distress  Cardiovascular: Regular rate and rhythm, S1/S2, no murmurs  Respiratory: Clear to auscultation bilaterally, no wheezing, no crackles, no rhonchi  Abdomen: Soft, non tender, non distended, bowel sounds present  Extremities: pulses DP and PT palpable bilaterally  Neuro: Grossly nonfocal  Data Reviewed: Basic Metabolic Panel:  Recent Labs Lab 12/12/13 0045 12/14/13 0435 12/15/13 0401  NA 141 140 135*  K 3.7 3.1* 3.3*  CL 99 101  97  CO2 27 28 28   GLUCOSE 311* 127* 288*  BUN 21 18 19   CREATININE 1.32 1.12 1.10  CALCIUM 9.0 8.5 8.0*   CBC:  Recent Labs Lab 12/12/13 0045 12/14/13 0435 12/15/13 0401  WBC 12.5* 10.6* 11.5*  NEUTROABS 10.6*  --   --   HGB 11.9* 8.4* 8.3*  HCT 34.5* 24.2* 23.7*  MCV 92.0 91.7 89.4  PLT 79* 71* 77*   CBG:  Recent Labs Lab 12/15/13 0214 12/15/13 0425 12/15/13 0737 12/15/13 1200 12/15/13 1724  GLUCAP 317* 290* 243* 327* 253*    SURGICAL PCR SCREEN     Status: Abnormal   Collection Time    12/13/13  5:35 AM      Result Value Ref Range Status   MRSA, PCR NEGATIVE  NEGATIVE Final    Staphylococcus aureus POSITIVE (*) NEGATIVE Final     Scheduled Meds: . benazepril  10 mg Oral Daily  . doxazosin  4 mg Oral Daily  . finasteride  5 mg Oral Daily  . fluticasone  1 spray Each Nare Daily  . furosemide  40 mg Oral BID  . glyBURIDE  10 mg Oral Q breakfast  . glyBURIDE  2.5 mg Oral Q supper  . insulin glargine  21 Units Subcutaneous q morning - 10a  . loratadine  10 mg Oral Daily  . metoprolol succinate  50 mg Oral BID  . potassium chloride SA  20 mEq Oral Daily  . simvastatin  20 mg Oral QPM  . traMADol  50 mg Oral BID  . Umeclidinium-Vilanterol  1 puff Inhalation Daily   Continuous Infusions: . sodium chloride 0.9 % 1,000 mL with potassium chloride 10 mEq infusion 50 mL/hr at 12/14/13 1812

## 2013-12-15 NOTE — Progress Notes (Signed)
Clinical Social Work Department BRIEF PSYCHOSOCIAL ASSESSMENT 12/15/2013  Patient:  Ronald [name], Ronald [name]     Account Number:  [id number]     Admit date:  12/11/2013  Clinical Social Worker:  Renold Genta  Date/Time:  12/15/2013 02:00 PM  Referred by:  Physician  Date Referred:  12/15/2013 Referred for  SNF Placement   Other Referral:   Interview type:  Patient Other interview type:    PSYCHOSOCIAL DATA Living Status:  ALONE Admitted from facility:   Level of care:   Primary support name:  Lucas Winograd (son) ph#: [phone] Primary support relationship to patient:  CHILD, ADULT Degree of support available:   good    CURRENT CONCERNS Current Concerns  Other - See comment   Other Concerns:    SOCIAL WORK ASSESSMENT / PLAN CSW received consult that patient may need SNF at discharge.   Assessment/plan status:  Information/Referral to [employer] Other assessment/ plan:   Information/referral to community resources:   CSW completed FL2 and faxed information out to [facility] - provided list of facilities.    PATIENT'S/FAMILY'S RESPONSE TO PLAN OF CARE: Patient informed CSW that he hopes to do well with PT this afternoon and that he can just go home with home health at discharge. Patient is looking into hiring caregivers for additional assistance. Patient informed CSW that he had been to [facility] back in February and if he needs to go to SNF that he would be ok with going there. Patient states that he has 3 children that all live in [place] and help him with medications, meals, doctors appointments, etc.    CSW will follow-up after PT evaluation.       Raynaldo Opitz, [facility] Clinical Social Worker cell #: [phone]

## 2013-12-15 NOTE — Progress Notes (Signed)
ANTICOAGULATION CONSULT NOTE - Initial Consult  Pharmacy Consult for Warfarin Indication: AFib/AVR  Assessment: 80 yr male with history of hip fracture.  Admitted on 10/18 s/p fall and sustaining left hip fracture.  On warfarin PTA for AVR and AFib; 2.5mg  daily except 5mg  on Tue/Thur. INR upon admission was elevated at 4.73 (10/18); warf placed on hold and given Vit K pending surgery.  Underwent ORIF of femur 10/20, and resumed on warfarin that evening.  Also on prophylactic dose lovenox per MD beginning today  Today, 10/21:  Hgb low at 8.3 but drop appears to have been before Sgx.  No change from yesterday  Plt consistently low but stable  INR SUBtherapeutic after 1 dose of warfarin 4 mg  On lovenox 40 mg sq daily  Simvastatin-warfarin interaction noted, but stable on this combination at home  Eating > 50% of meals   Goal of Therapy:  INR 2-3    Plan:   Warfarin 5 mg po x 1 tonight.  I expect his INR to begin rising tomorrow or the day after, and would give 2.5 mg tomorrow unless his INR continues to fall  Check daily PT/INR  CBC at least q72hr while on warfarin    Allergies  Allergen Reactions  . Aspirin Anaphylaxis, Shortness Of Breath and Rash    "broke out in white welts; red blotches neck and face; windpipe closing; ~ 1962"    Patient Measurements: Height: 6' (182.9 cm) Weight: 191 lb 12.8 oz (87 kg) IBW/kg (Calculated) : 77.6  Vital Signs: Temp: 97.8 F (36.6 C) (10/21 0605) Temp Source: Oral (10/21 0605) BP: 125/62 mmHg (10/21 0605) Pulse Rate: 100 (10/21 0605)  Labs:  Recent Labs  12/14/13 0030 12/14/13 0435 12/15/13 0401  HGB  --  8.4* 8.3*  HCT  --  24.2* 23.7*  PLT  --  71* 77*  LABPROT 21.9* 20.1* 17.8*  INR 1.89* 1.70* 1.46  CREATININE  --  1.12 1.10    Estimated Creatinine Clearance: 50 ml/min (by C-G formula based on Cr of 1.1).   Medical History: Past Medical History  Diagnosis Date  . AS (aortic stenosis)     bovine aortic  valve replacement 01/07/11  . HTN (hypertension)   . Thrombocytopenia due to drugs     seen by Dr Inda Merlin plts 114000 no rx  . Peripheral vascular disease very poor circulation legs and feet ... stents right and left legs... done in dr j. Gwenlyn Found 's office.   . Depression wife died 4 years ago.    Marland Kitchen COPD (chronic obstructive pulmonary disease)   . Shortness of breath   . Recurrent upper respiratory infection (URI)     sinusitis  . Claudication in peripheral vascular disease 06/17/2011  . S/P angioplasty with stent, diamond back rotational athrectomy Prox. Rt. SFA 06/17/2011 06/17/2011  . Neuropathy due to secondary diabetes   . Type II diabetes mellitus   . Pneumonia 07/25/11    left  . Persistent atrial fibrillation   . Blood transfusion     w/hip operation  . History of stomach ulcers ~ 1951  . Renal artery stenosis 2006    renal artery stent  . Bladder cancer     Bladder Cancer local  . Cellulitis of left lower extremity   . Chronic diastolic congestive heart failure   . Normal coronary arteries Sept 2012    Medications:  Scheduled:  . benazepril  10 mg Oral Daily  . docusate sodium  100 mg Oral BID  .  doxazosin  4 mg Oral Daily  . enoxaparin (LOVENOX) injection  40 mg Subcutaneous Q24H  . ferrous sulfate  325 mg Oral BID WC  . finasteride  5 mg Oral Daily  . fluticasone  1 spray Each Nare Daily  . furosemide  40 mg Oral BID  . glipiZIDE  10 mg Oral QAC breakfast  . glipiZIDE  2.5 mg Oral QHS  . insulin aspart  0-15 Units Subcutaneous TID WC  . insulin aspart  0-5 Units Subcutaneous QHS  . insulin glargine  21 Units Subcutaneous q morning - 10a  . loratadine  10 mg Oral Daily  . metoprolol succinate  50 mg Oral BID  . polyethylene glycol  17 g Oral BID  . potassium chloride SA  20 mEq Oral Daily  . simvastatin  20 mg Oral QPM  . sodium chloride  3 mL Intravenous Q12H  . traMADol  50 mg Oral BID  . Umeclidinium-Vilanterol  1 puff Inhalation Daily  . Warfarin -  Pharmacist Dosing Inpatient   Does not apply q1800   Infusions:  . sodium chloride 0.9 % 1,000 mL with potassium chloride 10 mEq infusion 50 mL/hr at 12/14/13 [place], PharmD Pager: [phone] 12/15/2013, 10:39 AM

## 2013-12-15 NOTE — Evaluation (Signed)
Physical Therapy Evaluation Patient Details Name: Ronald [name] MRN: [id number] DOB: [birth year] Today's Date: 12/15/2013   History of Present Illness  80 yo male s/p ORIF L IT femur 12/14/13. Hx of aortic stenosis, HTN, COPD, DM, Afib, neuropathy, bladder cancer, CHF, R femur IM nail. Pt is from home alone.   Clinical Impression  On eval, pt required Max assist +2 for mobility-able to take a few steps in room with RW. Limited by fatigue, pain. Discussed d/c plan-informed pt that therapist recommendation is for SNF for continued rehab. Pt however states he would like to go home.     Follow Up Recommendations SNF;Supervision/Assistance - 24 hour    Equipment Recommendations  None recommended by PT    Recommendations for Other Services OT consult     Precautions / Restrictions Precautions Precautions: Fall Restrictions Weight Bearing Restrictions: No LLE Weight Bearing: Weight bearing as tolerated      Mobility  Bed Mobility Overal bed mobility: Needs Assistance Bed Mobility: Supine to Sit     Supine to sit: Max assist;+2 for physical assistance;+2 for safety/equipment;HOB elevated     General bed mobility comments: Assist for trunk and bil LEs. Increased time. Max cueing for safety, technique, hand placement. Utilized bedpad for scooting, positioning. Multimodal cues for safety, technique.  Transfers Overall transfer level: Needs assistance Equipment used: Rolling walker (2 wheeled) Transfers: Sit to/from Stand Sit to Stand: Mod assist;+2 physical assistance;+2 safety/equipment;From elevated surface         General transfer comment: Assist to rise, stabilize, control descent. Multimodal cueing for safety, technique, hand placement/LE placement  Ambulation/Gait Ambulation/Gait assistance: Mod assist;+2 safety/equipment;+2 physical assistance Ambulation Distance (Feet): 5 Feet Assistive device: Rolling walker (2 wheeled) Gait Pattern/deviations: Step-to  pattern;Decreased stride length;Decreased step length - left;Decreased step length - right;Decreased stance time - left     General Gait Details: Assist to support pt and to advance L LE. Multimodal cueing for safety, technique, sequence, posture. Fatigued very easily. Followed closely with recliner.   Stairs            Wheelchair Mobility    Modified Rankin (Stroke Patients Only)       Balance Overall balance assessment: Needs assistance;History of Falls         Standing balance support: Bilateral upper extremity supported;During functional activity Standing balance-Leahy Scale: Poor                               Pertinent Vitals/Pain Pain Assessment: Faces Faces Pain Scale: Hurts even more Pain Location: L LE with activity Pain Intervention(s): Monitored during session;Limited activity within patient's tolerance    Home Living Family/patient expects to be discharged to:: Skilled nursing facility     Type of Home: House Home Access: Stairs to enter Entrance Stairs-Rails: Right;Left (front) Entrance Stairs-Number of Steps: 1 step into carport; 2 steps front door.  stair lilft in basement Home Layout: One level Home Equipment: Walker - 4 wheels;Walker - 2 wheels;Cane - single point;Wheelchair - power;Bedside commode;Grab bars - tub/shower      Prior Function Level of Independence: Independent               Hand Dominance        Extremity/Trunk Assessment   Upper Extremity Assessment: Generalized weakness           Lower Extremity Assessment: LLE deficits/detail   LLE Deficits / Details: hip abd/add 2/5, hip flex 2/5, moves ankle well  Cervical / Trunk Assessment: Kyphotic  Communication   Communication: No difficulties  Cognition Arousal/Alertness: Awake/alert Behavior During Therapy: WFL for tasks assessed/performed Overall Cognitive Status: Within Functional Limits for tasks assessed                      General  Comments      Exercises        Assessment/Plan    PT Assessment Patient needs continued PT services  PT Diagnosis Difficulty walking;Abnormality of gait;Generalized weakness;Acute pain   PT Problem List Decreased strength;Decreased range of motion;Decreased activity tolerance;Decreased balance;Decreased mobility;Decreased knowledge of use of DME;Pain;Decreased coordination  PT Treatment Interventions DME instruction;Gait training;Functional mobility training;Therapeutic activities;Therapeutic exercise;Patient/family education;Balance training   PT Goals (Current goals can be found in the Care Plan section) Acute Rehab PT Goals Patient Stated Goal: home. PT Goal Formulation: With patient Time For Goal Achievement: 12/29/13 Potential to Achieve Goals: Good    Frequency Min 3X/week   Barriers to discharge        Co-evaluation               End of Session Equipment Utilized During Treatment: Gait belt Activity Tolerance: Patient limited by fatigue;Patient limited by pain Patient left: in chair;with call bell/phone within reach;with chair alarm set           Time: 2703-5009 PT Time Calculation (min): 24 min   Charges:   PT Evaluation $Initial PT Evaluation Tier I: 1 Procedure PT Treatments $Gait Training: 8-22 mins $Therapeutic Activity: 8-22 mins   PT G Codes:          Weston Anna, MPT Pager: [phone]

## 2013-12-15 NOTE — Progress Notes (Signed)
Clinical Social Work Department CLINICAL SOCIAL WORK PLACEMENT NOTE 12/15/2013  Patient:  CORTLANDT, CAPUANO  Account Number:  [id number] Admit date:  12/11/2013  Clinical Social Worker:  Renold Genta  Date/time:  12/15/2013 02:05 PM  Clinical Social Work is seeking post-discharge placement for this patient at the following level of care:   SKILLED NURSING   (*CSW will update this form in Epic as items are completed)   12/15/2013  Patient/family provided with [place] Department of Clinical Social Work's list of facilities offering this level of care within the geographic area requested by the patient (or if unable, by the patient's family).  12/15/2013  Patient/family informed of their freedom to choose among providers that offer the needed level of care, that participate in Medicare, Medicaid or managed care program needed by the patient, have an available bed and are willing to accept the patient.  12/15/2013  Patient/family informed of [facility]' ownership interest in [facility], as well as of the fact that they are under no obligation to receive care at this facility.  PASARR submitted to EDS on 12/15/2013 PASARR number received on 12/15/2013  FL2 transmitted to all facilities in geographic area requested by pt/family on  12/15/2013 FL2 transmitted to all facilities within larger geographic area on   Patient informed that his/her managed care company has contracts with or will negotiate with  certain facilities, including the following:     Patient/family informed of bed offers received:   Patient chooses bed at  Physician recommends and patient chooses bed at    Patient to be transferred to  on   Patient to be transferred to facility by  Patient and family notified of transfer on  Name of family member notified:    The following physician request were entered in Epic:   Additional Comments:   Raynaldo Opitz, [place] Social Worker cell #: [phone]

## 2013-12-15 NOTE — Op Note (Signed)
NAMEMarland Kitchen  Ronald [name], Ronald [name] NO.:  [id number]  MEDICAL RECORD NO.:  [id number]  LOCATION:  3300                         FACILITY:  [facility]  PHYSICIAN:  Ronald Cassis. Ronald [name], M.D.  DATE OF BIRTH:  1922/06/14  DATE OF PROCEDURE:  12/14/2013 DATE OF DISCHARGE:                              OPERATIVE REPORT   PREOPERATIVE DIAGNOSIS:  Closed displaced left hip intertrochanteric femur fracture.  POSTOPERATIVE DIAGNOSIS:  Closed displaced left hip intertrochanteric femur fracture.  PROCEDURE:  Open reduction and internal fixation of the left intertrochanteric femur fracture utilizing a [employer] AFFIXUS trochanteric nail.  The component used is AFFIXUS nail 11 x 180 mm with a 105-mm lag screw and a distal interlock.  SURGEON:  Ronald Cassis. Ronald [name], M.D.  ASSISTANT:  Ronald Orleans, PA.  [name] was present for the entirety of the case from perioperative management of the extremity, general facilitation of the case, and primary wound closure.  ANESTHESIA:  General.  SPECIMENS:  None.  BLOOD LOSS:  Probably about 250 mL.  Blood administered due to his hemodynamic status as well as heart failure and preoperative hemoglobin is 8.4 and Coumadin use.  We elected to give 1 unit of blood followed by Lasix.  He will be followed by the Medical Service otherwise.  COMPLICATIONS:  None.  INDICATION FOR PROCEDURE:  Ronald [name] is a pleasant 80 year old independently living male who unfortunately had a fall when he stumbled on a stone outside.  He enjoys his work.  He was brought to the emergency room due to inability to bear weight and his radiographs revealed a left intertrochanteric femur fracture.  He has had a right hip fracture of similar variant two and a half years ago fixed by Dr. Justice [name], my group.  Orthopedics was consulted.  When he came into the hospital, his INR was supratherapeutic, greater than 4.  He received vitamin K to help normalize.  By today, his INR normalized  below 2 and was deemed ready for the surgery.  The risks, benefits, and necessity of the procedure were discussed in longevity.  Consent was obtained for benefit of fracture management.  PROCEDURE IN DETAIL:  The patient was brought to the operative theater. Once adequate anesthesia, preoperative antibiotics, Ancef administered 2 g, he was positioned supine on the fracture table.  His right lower extremity was flexed and abducted out of way with bony prominences padded particularly over the peroneal nerve.  Left hip was placed in a traction boot.  The traction/internal rotation applied with a padded perineal post.  Fluoroscopy was used to confirm reduction of the fracture.  The left hip was then prepped and draped in sterile fashion using shower curtain technique.  Time-out was performed identifying the patient, planned procedure, and extremity.  Incision was made lateral to the tip of the trochanter proximally, identified under fluoroscopic imaging. The soft tissues dissection was carried down to the gluteal fascia which was split.  The guidewire was then inserted into the tip of the trochanter and passed across the fracture site.  Proximal femur was then opened with a drill.  I then passed by hand an 11 x 100 mm nail to the appropriate depth.  Once it was at appropriate depth, I used the triple gold guide and a guidewire and inserted a guide pin into the center aspect of the femoral head in AP and lateral planes.  I then measured the depth, selected a 105-mm lag screw.  I drilled and then placed a lag screw.  The traction was taken off and the traction wheel applied to the lag screw and medialized the shaft of the fracture.  Once this was confirmed fluoroscopically, the distal interlock was placed distally measuring 42 mm.  At this point, the guide was removed.  Final radiographs were obtained in AP and lateral planes.  The wounds were irrigated with normal saline solution.   The proximal wound was closed in layers using #1 Vicryl in the gluteal fascia, 2-0 Vicryl and a running 3-0 Monocryl.  The distal wounds were closed with Vicryl and suture end.  All wounds were cleaned, dried, and dressed sterilely using Dermabond and Aquacel dressings.  He was then brought to the recovery room in stable condition.     Ronald [name], M.D.     MDO/MEDQ  D:  12/14/2013  T:  12/15/2013  Job:  [id number]

## 2013-12-15 NOTE — Progress Notes (Signed)
[name], male   DOB: December 18, 1922, 80 y.o.   MRN: [id number] Subjective: 1 Day Post-Op Procedure(s) (LRB): INTRAMEDULLARY (IM) NAIL FEMORAL (Left)    Patient reports pain as mild.  Doing OK no events overnight, eating breakfast this am  Objective:   VITALS:   Filed Vitals:   12/15/13 0605  BP: 125/62  Pulse: 100  Temp: 97.8 F (36.6 C)  Resp: 20    Neurovascular intact Incision: dressing C/D/I  LABS  Recent Labs  12/14/13 0435 12/15/13 0401  HGB 8.4* 8.3*  HCT 24.2* 23.7*  WBC 10.6* 11.5*  PLT 71* 77*     Recent Labs  12/14/13 0435 12/15/13 0401  NA 140 135*  K 3.1* 3.3*  BUN 18 19  CREATININE 1.12 1.10  GLUCOSE 127* 288*     Recent Labs  12/14/13 0435 12/15/13 0401  INR 1.70* 1.46     Assessment/Plan: 1 Day Post-Op Procedure(s) (LRB): INTRAMEDULLARY (IM) NAIL FEMORAL (Left)   Advance diet Up with therapy Discharge to SNF when medically stable WBAT LLE Back on Coumadin Medical management and optimization

## 2013-12-16 ENCOUNTER — Ambulatory Visit: Payer: Self-pay | Admitting: Pharmacist Clinician (PhC)/ Clinical Pharmacy Specialist

## 2013-12-16 DIAGNOSIS — M21252 Flexion deformity, left hip: Secondary | ICD-10-CM | POA: Diagnosis not present

## 2013-12-16 DIAGNOSIS — R062 Wheezing: Secondary | ICD-10-CM | POA: Diagnosis not present

## 2013-12-16 DIAGNOSIS — I350 Nonrheumatic aortic (valve) stenosis: Secondary | ICD-10-CM | POA: Diagnosis not present

## 2013-12-16 DIAGNOSIS — N400 Enlarged prostate without lower urinary tract symptoms: Secondary | ICD-10-CM | POA: Diagnosis not present

## 2013-12-16 DIAGNOSIS — R296 Repeated falls: Secondary | ICD-10-CM | POA: Diagnosis not present

## 2013-12-16 DIAGNOSIS — J309 Allergic rhinitis, unspecified: Secondary | ICD-10-CM | POA: Diagnosis not present

## 2013-12-16 DIAGNOSIS — E119 Type 2 diabetes mellitus without complications: Secondary | ICD-10-CM | POA: Diagnosis not present

## 2013-12-16 DIAGNOSIS — I10 Essential (primary) hypertension: Secondary | ICD-10-CM | POA: Diagnosis not present

## 2013-12-16 DIAGNOSIS — E1165 Type 2 diabetes mellitus with hyperglycemia: Secondary | ICD-10-CM | POA: Diagnosis not present

## 2013-12-16 DIAGNOSIS — E785 Hyperlipidemia, unspecified: Secondary | ICD-10-CM | POA: Diagnosis not present

## 2013-12-16 DIAGNOSIS — M810 Age-related osteoporosis without current pathological fracture: Secondary | ICD-10-CM | POA: Diagnosis not present

## 2013-12-16 DIAGNOSIS — S72142S Displaced intertrochanteric fracture of left femur, sequela: Secondary | ICD-10-CM | POA: Diagnosis not present

## 2013-12-16 DIAGNOSIS — D62 Acute posthemorrhagic anemia: Secondary | ICD-10-CM | POA: Diagnosis not present

## 2013-12-16 DIAGNOSIS — I482 Chronic atrial fibrillation: Secondary | ICD-10-CM | POA: Diagnosis not present

## 2013-12-16 DIAGNOSIS — I5032 Chronic diastolic (congestive) heart failure: Secondary | ICD-10-CM | POA: Diagnosis not present

## 2013-12-16 DIAGNOSIS — S72145D Nondisplaced intertrochanteric fracture of left femur, subsequent encounter for closed fracture with routine healing: Secondary | ICD-10-CM | POA: Diagnosis not present

## 2013-12-16 DIAGNOSIS — I4819 Other persistent atrial fibrillation: Secondary | ICD-10-CM

## 2013-12-16 DIAGNOSIS — R278 Other lack of coordination: Secondary | ICD-10-CM | POA: Diagnosis not present

## 2013-12-16 DIAGNOSIS — D649 Anemia, unspecified: Secondary | ICD-10-CM | POA: Diagnosis not present

## 2013-12-16 DIAGNOSIS — Z7901 Long term (current) use of anticoagulants: Secondary | ICD-10-CM | POA: Diagnosis not present

## 2013-12-16 DIAGNOSIS — S72142A Displaced intertrochanteric fracture of left femur, initial encounter for closed fracture: Secondary | ICD-10-CM | POA: Diagnosis not present

## 2013-12-16 DIAGNOSIS — Z794 Long term (current) use of insulin: Secondary | ICD-10-CM | POA: Diagnosis not present

## 2013-12-16 DIAGNOSIS — M25552 Pain in left hip: Secondary | ICD-10-CM | POA: Diagnosis not present

## 2013-12-16 LAB — BASIC METABOLIC PANEL
ANION GAP: 10 (ref 5–15)
BUN: 26 mg/dL — ABNORMAL HIGH (ref 6–23)
CHLORIDE: 100 meq/L (ref 96–112)
CO2: 28 meq/L (ref 19–32)
Calcium: 8.3 mg/dL — ABNORMAL LOW (ref 8.4–10.5)
Creatinine, Ser: 1.19 mg/dL (ref 0.50–1.35)
GFR calc Af Amer: 61 mL/min — ABNORMAL LOW (ref >90)
GFR calc non Af Amer: 52 mL/min — ABNORMAL LOW (ref >90)
Glucose, Bld: 290 mg/dL — ABNORMAL HIGH (ref 70–99)
POTASSIUM: 3.4 meq/L — AB (ref 3.7–5.3)
SODIUM: 138 meq/L (ref 137–147)

## 2013-12-16 LAB — CBC
HCT: 21.9 % — ABNORMAL LOW (ref 39.0–52.0)
HEMOGLOBIN: 7.7 g/dL — AB (ref 13.0–17.0)
MCH: 31.3 pg (ref 26.0–34.0)
MCHC: 35.2 g/dL (ref 30.0–36.0)
MCV: 89 fL (ref 78.0–100.0)
PLATELETS: 94 10*3/uL — AB (ref 150–400)
RBC: 2.46 MIL/uL — AB (ref 4.22–5.81)
RDW: 15.3 % (ref 11.5–15.5)
WBC: 10.2 10*3/uL (ref 4.0–10.5)

## 2013-12-16 LAB — GLUCOSE, CAPILLARY
GLUCOSE-CAPILLARY: 292 mg/dL — AB (ref 70–99)
Glucose-Capillary: 286 mg/dL — ABNORMAL HIGH (ref 70–99)
Glucose-Capillary: 268 mg/dL — ABNORMAL HIGH (ref 70–99)
Glucose-Capillary: 301 mg/dL — ABNORMAL HIGH (ref 70–99)

## 2013-12-16 LAB — PROTIME-INR
INR: 1.64 — ABNORMAL HIGH (ref 0.00–1.49)
PROTHROMBIN TIME: 19.6 s — AB (ref 11.6–15.2)

## 2013-12-16 MED ORDER — TRAMADOL HCL 50 MG PO TABS: 50.0000 mg | ORAL_TABLET | Freq: Two times a day (BID) | ORAL | Status: DC

## 2013-12-16 MED ORDER — POLYETHYLENE GLYCOL 3350 17 G PO PACK: 17.0000 g | PACK | Freq: Two times a day (BID) | ORAL | Status: DC

## 2013-12-16 MED ORDER — INSULIN GLARGINE 100 UNIT/ML ~~LOC~~ SOLN: 26.0000 [IU] | Freq: Every morning | SUBCUTANEOUS | Status: DC

## 2013-12-16 MED ORDER — INSULIN GLARGINE 100 UNIT/ML ~~LOC~~ SOLN
26.0000 [IU] | Freq: Every morning | SUBCUTANEOUS | Status: DC
Start: 2013-12-16 — End: 2015-03-07

## 2013-12-16 MED ORDER — DSS 100 MG PO CAPS: 100.0000 mg | ORAL_CAPSULE | Freq: Two times a day (BID) | ORAL | Status: DC

## 2013-12-16 MED ORDER — FERROUS SULFATE 325 (65 FE) MG PO TABS: 325.0000 mg | ORAL_TABLET | Freq: Three times a day (TID) | ORAL | Status: DC

## 2013-12-16 MED ORDER — FERROUS SULFATE 325 (65 FE) MG PO TABS
325.0000 mg | ORAL_TABLET | Freq: Three times a day (TID) | ORAL | Status: DC
  Administered 2013-12-16: 325 mg via ORAL
  Filled 2013-12-16 (×2): qty 1

## 2013-12-16 MED ORDER — WARFARIN SODIUM 2.5 MG PO TABS
2.5000 mg | ORAL_TABLET | Freq: Once | ORAL | Status: DC
  Filled 2013-12-16: qty 1

## 2013-12-16 NOTE — Progress Notes (Signed)
     Subjective: 2 Days Post-Op Procedure(s) (LRB): INTRAMEDULLARY (IM) NAIL FEMORAL (Left)   Patient reports pain as mild, pain controlled. Pt states that it feels better than it did prior to surgery. No events throughout the night.  Objective:   VITALS:   Filed Vitals:   12/16/13 0947  BP: 120/55  Pulse: 78  Temp: 98.5 F (36.9 C)  Resp: 22    Dorsiflexion/Plantar flexion intact Incision: dressing C/[name]/I No cellulitis present Compartment soft  LABS  Recent Labs  12/14/13 0435 12/15/13 0401 12/16/13 0355  HGB 8.4* 8.3* 7.7*  HCT 24.2* 23.7* 21.9*  WBC 10.6* 11.5* 10.2  PLT 71* 77* 94*     Recent Labs  12/14/13 0435 12/15/13 0401 12/16/13 0355  NA 140 135* 138  K 3.1* 3.3* 3.4*  BUN 18 19 26*  CREATININE 1.12 1.10 1.19  GLUCOSE 127* 288* 290*     Assessment/Plan: 2 Days Post-Op Procedure(s) (LRB): INTRAMEDULLARY (IM) NAIL FEMORAL (Left) Up with therapy Discharge to SNF when medically ready, orthopaedically stable.  Ortho recommendations:  Patient returned to Coumadin, medically managed to obtain therapeutic INR.  Norco for pain management (Rx written).  MiraLax and Colace for constipation  Iron 325 mg tid for 2-3 weeks   WBAT on the left leg.  Dressing to remain in place until follow in clinic in 2 weeks.  Dressing is waterproof and may shower with it in place.  Follow up in 2 weeks at [facility]. Follow up with OLIN,Ronald [name] in 2 weeks.  Contact information:  [facility] [address] [place] Ronald [name]   PAC  12/16/2013, 9:57 AM

## 2013-12-16 NOTE — Progress Notes (Signed)
Patient is set to discharge to [facility] today. Patient & son at bedside, [name] aware. Discharge packet given to RN, Tess. [facility] called for transport.   Clinical Social Work Department CLINICAL SOCIAL WORK PLACEMENT NOTE 12/16/2013  Patient:  JAMICHAEL, KNOTTS  Account Number:  [id number] Admit date:  12/11/2013  Clinical Social Worker:  Renold Genta  Date/time:  12/15/2013 02:05 PM  Clinical Social Work is seeking post-discharge placement for this patient at the following level of care:   SKILLED NURSING   (*CSW will update this form in Epic as items are completed)   12/15/2013  Patient/family provided with [place] Department of Clinical Social Work's list of facilities offering this level of care within the geographic area requested by the patient (or if unable, by the patient's family).  12/15/2013  Patient/family informed of their freedom to choose among providers that offer the needed level of care, that participate in Medicare, Medicaid or managed care program needed by the patient, have an available bed and are willing to accept the patient.  12/15/2013  Patient/family informed of [facility]' ownership interest in [facility], as well as of the fact that they are under no obligation to receive care at this facility.  PASARR submitted to EDS on 12/15/2013 PASARR number received on 12/15/2013  FL2 transmitted to all facilities in geographic area requested by pt/family on  12/15/2013 FL2 transmitted to all facilities within larger geographic area on   Patient informed that his/her managed care company has contracts with or will negotiate with  certain facilities, including the following:     Patient/family informed of bed offers received:  12/16/2013 Patient chooses bed at [facility] Physician recommends and patient chooses bed at    Patient to be transferred to [place] on  12/16/2013 Patient to be transferred to facility by [facility] Patient and  family notified of transfer on 12/16/2013 Name of family member notified:  patient's son, [name] via phone  The following physician request were entered in Epic:   Additional Comments:   Raynaldo Opitz, [place] Social Worker cell #: [phone]

## 2013-12-16 NOTE — Progress Notes (Signed)
Inpatient Diabetes Program Recommendations  AACE/ADA: New Consensus Statement on Inpatient Glycemic Control (2013)  Target Ranges:  Prepandial:   less than 140 mg/dL      Peak postprandial:   less than 180 mg/dL (1-2 hours)      Critically ill patients:  140 - 180 mg/dL    Results for LAVONTAE, CORNIA (MRN [id number]) as of 12/16/2013 08:59  Ref. Range 12/15/2013 07:37 12/15/2013 12:00 12/15/2013 17:24 12/15/2013 20:10  Glucose-Capillary Latest Range: 70-99 mg/dL 243 (H) 327 (H) 253 (H) 292 (H)    Results for THURMON, MIZELL (MRN [id number]) as of 12/16/2013 08:59  Ref. Range 12/16/2013 04:22 12/16/2013 07:53  Glucose-Capillary Latest Range: 70-99 mg/dL 286 (H) 268 (H)    Patient POD #2 for Ortho surgery for Hip fracture.   Having significant glucose elevations. Note that patient has NOT received any steroids since admission.    Current DM Medication Orders: Lantus 21 units QAM         Novolog Moderate SSI tid ac + HS         Glipizide 10 mg QAM/ 2.5 mg QPM   Eating 65-100% of meals.     MD- Please consider the following insulin adjustments if patient continues to have significant glucose elevations:  1. Increase Lantus to 25 units QAM  2. Add Novolog Meal Coverage- Novolog 4 units tid with meals     Will follow Wyn Quaker RN, MSN, CDE Diabetes Coordinator Inpatient Diabetes Program Team Pager: [phone] (8a-10p)

## 2013-12-16 NOTE — Progress Notes (Signed)
ANTICOAGULATION CONSULT NOTE - Follow Up Consult  Pharmacy Consult for Warfarin Indication: Afib / AVR  Allergies  Allergen Reactions  . Aspirin Anaphylaxis, Shortness Of Breath and Rash    "broke out in white welts; red blotches neck and face; windpipe closing; ~ 1962"    Patient Measurements: Height: 5\' 11"  (180.3 cm) Weight: 203 lb 14.8 oz (92.5 kg) IBW/kg (Calculated) : 75.3  Vital Signs: Temp: 98.5 F (36.9 C) (10/22 0947) Temp Source: Oral (10/22 0947) BP: 120/55 mmHg (10/22 0947) Pulse Rate: 78 (10/22 0947)  Labs:  Recent Labs  12/14/13 0435 12/15/13 0401 12/16/13 0355  HGB 8.4* 8.3* 7.7*  HCT 24.2* 23.7* 21.9*  PLT 71* 77* 94*  LABPROT 20.1* 17.8* 19.6*  INR 1.70* 1.46 1.64*  CREATININE 1.12 1.10 1.19    Estimated Creatinine Clearance: 48.9 ml/min (by C-G formula based on Cr of 1.19).   Inpatient Warfarin Doses: 10/20: 4mg  10/21: 5mg   Assessment: 80 yr male admitted on 10/18 s/p fall and sustaining left hip fracture.  He was on warfarin PTA for AVR and AFib; 2.5mg  daily except 5mg  on Tue/Thur. INR upon admission was elevated at 4.73 (10/18); warf held on admission.  He was given FFP and Vit K prior to surgery.  Underwent ORIF of femur 10/20, and resumed on warfarin that evening.  Prophylactic dose lovenox per MD started on 10/21.  Today, 10/22  INR 1.64, remains subtherapeutic, but increasing.   Hgb continued to decrease to 7.7   (Transfused on 10/20)  No bleeding reported  Plt consistently low but stable/ improved today.  Simvastatin-warfarin interaction noted, but stable on this combination at home  Eating 100% of last 2 meals   Goal of Therapy:  INR 2-3 Monitor platelets by anticoagulation protocol: Yes   Plan:   Warfarin 2.5mg  PO tonight at 1800.  Continue Lovenox 40mg  SQ daily per MD dosing.  For discharge, would recommend close follow up as INR was supratherapeutic on admission.  Please have INR recheck by Monday,  10/26.  Gretta Arab PharmD, BCPS Pager [phone] 12/16/2013 11:48 AM

## 2013-12-16 NOTE — Discharge Summary (Signed)
Physician Discharge Summary  ABDULHADI STOPA TSV:[id number] DOB: 1922-04-19 DOA: 12/11/2013  PCP: Precious Reel, MD  Admit date: 12/11/2013 Discharge date: 12/16/2013  Time spent: >30 minutes  Recommendations for Outpatient Follow-up:  CBC in 5 days to follow Hgb trend INR in 2 days to follow coumadin level Follow up with orthopedic service in 2 weeks PCP to follow CBG's and A1C; further adjustments to insulin therapy to be done base on CBG's fluctuations  Discharge Diagnoses:  Intertrochanteric fracture of left femur Diabetes type 2 uncontrolled, with A1C 8.1 Thrombocytopenia Anemia of chronic disease and ABLA Atrial fibrillation Dyslipidemia  Leukocytosis   Discharge Condition: stable and improved. Follow up with orthopedic service in 2 weeks. Will D/C to SNF for rehabilitation.   Diet recommendation: low sodium diet and low carbohydrates  Filed Weights   12/15/13 0426 12/16/13 0450 12/16/13 0947  Weight: 87 kg (191 lb 12.8 oz) 87.1 kg (192 lb 0.3 oz) 92.5 kg (203 lb 14.8 oz)    History of present illness:  80 year old male with a history of right hip fracture. He was carrying some material for party this afternoon about 1:30 and his toe caught on the edge of a rock causing him to fall. He fell onto his left hip. He states it was immediate excruciating pain in the left hip and is not able to bear weight on it. Pain is worse with movement or palpation. His grandson helped him to his motorized scooter and he continued working on the party preparations. He came to the ED this evening because of persistent pain. He denies other injury. He is on Coumadin for valve replacement and atrial fibrillation. Pt was found on xray to have L intertrochanteric hip fracture.    Hospital Course:  Intertrochanteric fracture of left femur status post mechanical fall  S/P hip surgery on 10/20  Ok to resume coumadin per Ortho  Per PT SNF  Will follow with Dr. Alvan Dame in 2 weeks Weight bearing as  tolerated  Atrial fibrillation  Rate controlled with metoprolol 50 mg PO BID.  Coumadin resumed at discharge INR Goal 2-3  Diabetes type II, no mention of complication or if controlled  A1c 8.1  On glipizide and lantus; last one increased to 26 units for better controlled Continue low carb diet  Dyslipidemia  Continue statin therapy   HTN  Continue Benazepril, Lasix, Metoprolol   Thrombocytopenia  Chronic, at baseline 80 - 100K  No active bleeding appreciated Platelets remains stable; continue follow up as needed with Dr. Earlie Server   Leukocytosis with low grade fever 99 F  Likely from the fracture, no clear infectious etiology  CXR is unremarkable and UA w/o signs of UTI  No antibiotics given WBC's WNL at discharge  Anemia of chronic disease with superimposed ABLA  Hg stable at 7.7, no sings of bleeding  repeat CBC in 5 days  Will start iron supplementation (TID)   Aortic Valve stenosis       -Status post valve replacement. Continue follow up with cardiology as an outpatient  BPH  Continue Cardura and Finasteride   DVT Prophylaxis:  Resumed coumadin    Procedures: Dg Chest 1 View 12/12/2013 No active disease. 2. Chronic and postoperative changes described above.  Dg Hip Complete Left 12/12/2013 Slightly separated 4 part left intertrochanteric fracture.   Consultations: Orthopedic service (Dr. Alvan Dame)  Discharge Exam: Filed Vitals:   12/16/13 0947  BP: 120/55  Pulse: 78  Temp: 98.5 F (36.9 C)  Resp: 22   General:  Pt is alert, follows commands appropriately, not in acute distress  Cardiovascular: Regular rate and rhythm, S1/S2, no murmurs  Respiratory: Clear to auscultation bilaterally, no wheezing, no crackles, no rhonchi  Abdomen: Soft, non tender, non distended, bowel sounds present  Extremities: pulses DP and PT palpable bilaterally, left lateral thigh wound with clean dressings and intact  Neuro: Grossly nonfocal    Discharge Instructions You  were cared for by a hospitalist during your hospital stay. If you have any questions about your discharge medications or the care you received while you were in the hospital after you are discharged, you can call the unit and asked to speak with the hospitalist on call if the hospitalist that took care of you is not available. Once you are discharged, your primary care physician will handle any further medical issues. Please note that NO REFILLS for any discharge medications will be authorized once you are discharged, as it is imperative that you return to your primary care physician (or establish a relationship with a primary care physician if you do not have one) for your aftercare needs so that they can reassess your need for medications and monitor your lab values.  Discharge Instructions   Diet - low sodium heart healthy    Complete by:  As directed      Discharge instructions    Complete by:  As directed   Weight bearing as tolerated Please take medications as prescribed Follow a low sodium diet INR in 2 days and adjust coumadin level as needed (goal is 2-3) CBC in 5 days to follow Hgb trend Follow up with Orthopedic service (Dr. Alvan Dame) in 2 weeks     Increase activity slowly    Complete by:  As directed      Weight bearing as tolerated    Complete by:  As directed           Current Discharge Medication List    START taking these medications   Details  docusate sodium 100 MG CAPS Take 100 mg by mouth 2 (two) times daily. Qty: 10 capsule, Refills: 0    ferrous sulfate 325 (65 FE) MG tablet Take 1 tablet (325 mg total) by mouth 3 (three) times daily with meals.    HYDROcodone-acetaminophen (NORCO) 5-325 MG per tablet Take 1-2 tablets by mouth every 6 (six) hours as needed. Qty: 90 tablet, Refills: 0    polyethylene glycol (MIRALAX / GLYCOLAX) packet Take 17 g by mouth 2 (two) times daily. Qty: 14 each, Refills: 0      CONTINUE these medications which have CHANGED   Details   insulin glargine (LANTUS) 100 UNIT/ML injection Inject 0.26 mLs (26 Units total) into the skin every morning.    traMADol (ULTRAM) 50 MG tablet Take 1 tablet (50 mg total) by mouth 2 (two) times daily. Qty: 30 tablet, Refills: 0      CONTINUE these medications which have NOT CHANGED   Details  alendronate (FOSAMAX) 70 MG tablet Take 70 mg by mouth every 14 (fourteen) days. Take with a full glass of water on an empty stomach. Sunday    benazepril (LOTENSIN) 10 MG tablet Take 10 mg by mouth daily.    doxazosin (CARDURA) 4 MG tablet Take 4 mg by mouth daily.     finasteride (PROSCAR) 5 MG tablet Take 5 mg by mouth daily.     fluticasone (FLONASE) 50 MCG/ACT nasal spray Place 1 spray into both nostrils daily.    furosemide (LASIX) 40 MG tablet  Take 1 tablet (40 mg total) by mouth 2 (two) times daily. Qty: 60 tablet, Refills: 5    glyBURIDE (DIABETA) 5 MG tablet Take 2.5-10 mg by mouth 2 (two) times daily with a meal. 2 tablets in the morning and 1/2 tablet in the evening    loratadine (CLARITIN) 10 MG tablet Take 10 mg by mouth daily.    metoprolol succinate (TOPROL-XL) 50 MG 24 hr tablet Take 50 mg by mouth 2 (two) times daily. Take with or immediately following a meal.    potassium chloride SA (K-DUR,KLOR-CON) 20 MEQ tablet Take 20 mEq by mouth daily.    simvastatin (ZOCOR) 20 MG tablet Take 20 mg by mouth every evening.    Umeclidinium-Vilanterol (ANORO ELLIPTA) 62.5-25 MCG/INH AEPB Inhale 1 puff into the lungs daily.    warfarin (COUMADIN) 5 MG tablet Take 2.5-5 mg by mouth every evening. 2.5 mg daily except Tuesday and Thursday take 5 mg       Allergies  Allergen Reactions  . Aspirin Anaphylaxis, Shortness Of Breath and Rash    "broke out in white welts; red blotches neck and face; windpipe closing; ~ 1962"   Follow-up Information   Follow up with Mauri Pole, MD. Schedule an appointment as soon as possible for a visit in 2 weeks.   Specialty:  Orthopedic Surgery    Contact information:   [address] [place] [address] [phone]       Follow up with Precious Reel, MD. Schedule an appointment as soon as possible for a visit in 10 days.   Specialty:  Internal Medicine   Contact information:   [name] [address] [phone]       The results of significant diagnostics from this hospitalization (including imaging, microbiology, ancillary and laboratory) are listed below for reference.    Significant Diagnostic Studies: Dg Chest 1 View  12/12/2013   CLINICAL DATA:  Preop for left hip fracture initial encounter  EXAM: CHEST - 1 VIEW  COMPARISON:  08/27/2013  FINDINGS: Prominent left ventricular size. The patient is status post median sternotomy for left atrial appendage exclusion and aortic valve replacement. Aortic contours are stable. Pulmonary hyperinflation consistent with COPD. There is no edema, consolidation, effusion, or pneumothorax.  IMPRESSION: 1.  No active disease. 2. Chronic and postoperative changes described above.   Electronically Signed   By: Jorje Guild M.D.   On: 12/12/2013 00:01   Dg Hip Complete Left  12/12/2013   CLINICAL DATA:  80 year old male post fall with left hip pain. Initial encounter.  EXAM: LEFT HIP - COMPLETE 2+ VIEW  COMPARISON:  None.  FINDINGS: Slightly separated 4 part left intertrochanteric fracture.  Remote right hip fracture treated with internal fixation.  Iliac stents in place.  IMPRESSION: Slightly separated 4 part left intertrochanteric fracture.   Electronically Signed   By: Chauncey Cruel M.D.   On: 12/12/2013 00:01   Dg Hip Operative Left  12/14/2013   CLINICAL DATA:  Fracture fixation left hip.  EXAM: OPERATIVE LEFT HIP  COMPARISON:  Plain films left hip 12/11/2013.  FINDINGS: We are provided with 3 fluoroscopic intraoperative spot views of the left hip. Images demonstrate a dynamic hip screw and short IM nail with a single distal interlocking screw for  fixation of an intertrochanteric fracture. Hardware is intact. No new abnormality is identified.  IMPRESSION: ORIF left intertrochanteric fracture.  No acute finding.   Electronically Signed   By: Inge Rise M.D.   On: 12/14/2013  15:29    Microbiology: Recent Results (from the past 240 hour(s))  SURGICAL PCR SCREEN     Status: Abnormal   Collection Time    12/13/13  5:35 AM      Result Value Ref Range Status   MRSA, PCR NEGATIVE  NEGATIVE Final   Staphylococcus aureus POSITIVE (*) NEGATIVE Final   Comment:            The Xpert SA Assay (FDA     approved for NASAL specimens     in patients over 9 years of age),     is one component of     a comprehensive surveillance     program.  Test performance has     been validated by [employer] for patients greater     than or equal to 68 year old.     It is not intended     to diagnose infection nor to     guide or monitor treatment.     Labs: Basic Metabolic Panel:  Recent Labs Lab 12/12/13 0045 12/14/13 0435 12/15/13 0401 12/16/13 0355  NA 141 140 135* 138  K 3.7 3.1* 3.3* 3.4*  CL 99 101 97 100  CO2 27 28 28 28   GLUCOSE 311* 127* 288* 290*  BUN 21 18 19  26*  CREATININE 1.32 1.12 1.10 1.19  CALCIUM 9.0 8.5 8.0* 8.3*   CBC:  Recent Labs Lab 12/12/13 0045 12/14/13 0435 12/15/13 0401 12/16/13 0355  WBC 12.5* 10.6* 11.5* 10.2  NEUTROABS 10.6*  --   --   --   HGB 11.9* 8.4* 8.3* 7.7*  HCT 34.5* 24.2* 23.7* 21.9*  MCV 92.0 91.7 89.4 89.0  PLT 79* 71* 77* 94*   BNP (last 3 results)  Recent Labs  08/27/13 1008  PROBNP 1130.0*   CBG:  Recent Labs Lab 12/15/13 1200 12/15/13 1724 12/15/13 2010 12/16/13 0422 12/16/13 0753  GLUCAP 327* 253* 292* 286* 268*    Signed:  Barton Dubois  [facility] 12/16/2013, 11:29 AM

## 2013-12-17 ENCOUNTER — Non-Acute Institutional Stay (SKILLED_NURSING_FACILITY): Payer: Medicare Other | Admitting: Adult Health

## 2013-12-17 DIAGNOSIS — E1165 Type 2 diabetes mellitus with hyperglycemia: Secondary | ICD-10-CM | POA: Diagnosis not present

## 2013-12-17 DIAGNOSIS — I482 Chronic atrial fibrillation, unspecified: Secondary | ICD-10-CM

## 2013-12-17 DIAGNOSIS — I10 Essential (primary) hypertension: Secondary | ICD-10-CM

## 2013-12-17 DIAGNOSIS — S72142S Displaced intertrochanteric fracture of left femur, sequela: Secondary | ICD-10-CM

## 2013-12-17 DIAGNOSIS — J309 Allergic rhinitis, unspecified: Secondary | ICD-10-CM

## 2013-12-17 DIAGNOSIS — D62 Acute posthemorrhagic anemia: Secondary | ICD-10-CM | POA: Diagnosis not present

## 2013-12-17 DIAGNOSIS — R062 Wheezing: Secondary | ICD-10-CM | POA: Diagnosis not present

## 2013-12-17 DIAGNOSIS — IMO0002 Reserved for concepts with insufficient information to code with codable children: Secondary | ICD-10-CM

## 2013-12-17 DIAGNOSIS — E785 Hyperlipidemia, unspecified: Secondary | ICD-10-CM

## 2013-12-18 LAB — TYPE AND SCREEN
ABO/RH(D): O POS
Antibody Screen: NEGATIVE
Unit division: 0
Unit division: 0

## 2013-12-20 ENCOUNTER — Encounter: Payer: Self-pay | Admitting: Adult Health

## 2013-12-20 DIAGNOSIS — E1165 Type 2 diabetes mellitus with hyperglycemia: Secondary | ICD-10-CM | POA: Insufficient documentation

## 2013-12-20 DIAGNOSIS — D62 Acute posthemorrhagic anemia: Secondary | ICD-10-CM | POA: Insufficient documentation

## 2013-12-20 DIAGNOSIS — IMO0002 Reserved for concepts with insufficient information to code with codable children: Secondary | ICD-10-CM | POA: Insufficient documentation

## 2013-12-20 DIAGNOSIS — J309 Allergic rhinitis, unspecified: Secondary | ICD-10-CM | POA: Insufficient documentation

## 2013-12-20 NOTE — Progress Notes (Signed)
Patient ID: CHUKWUKA FESTA, male   DOB: 03/22/1922, 80 y.o.   MRN: [id number]   12/20/2013  Facility:  [facility] Home Location:  [place] Room Number: 1006-2 LEVEL OF CARE:  SNF (31)   Chief Complaint  Patient presents with  . New Admit To SNF    HISTORY OF PRESENT ILLNESS:  This is an 89 year oldmale who has been admitted [facility] on 12/16/13 from [facility] with fracture of left femur S/P IM Nail femoral. He has been admitted for a short-term rehabilitation.  REASSESSMENT OF ONGOING PROBLEMS:  HTN: Pt 's HTN remains stable.  Denies CP, sob, DOE, pedal edema, headaches, dizziness or visual disturbances.  No complications from the medications currently being used.  Last BP : 126/60  DM:pt's DM remains stable.  Pt denies polyuria, polydipsia, polyphagia, changes in vision or hypoglycemic episodes.  No complications noted from the medication presently being used.  10/15 hemoglobin A1c is: 8.1  ATRIAL FIBRILLATION: the patients atrial fibrillation remains stable.  The patient denies DOE, tachycardia, orthopnea, transient neurological sx, pedal edema, palpitations, & PNDs.  No complications noted from the medications currently being used.   PAST MEDICAL HISTORY:  Past Medical History  Diagnosis Date  . AS (aortic stenosis)     bovine aortic valve replacement 01/07/11  . HTN (hypertension)   . Thrombocytopenia due to drugs     seen by Dr Inda Merlin plts 114000 no rx  . Peripheral vascular disease very poor circulation legs and feet ... stents right and left legs... done in dr j. Gwenlyn Found 's office.   . Depression wife died 4 years ago.    Marland Kitchen COPD (chronic obstructive pulmonary disease)   . Shortness of breath   . Recurrent upper respiratory infection (URI)     sinusitis  . Claudication in peripheral vascular disease 06/17/2011  . S/P angioplasty with stent, diamond back rotational athrectomy Prox. Rt. SFA 06/17/2011 06/17/2011  . Neuropathy  due to secondary diabetes   . Type II diabetes mellitus   . Pneumonia 07/25/11    left  . Persistent atrial fibrillation   . Blood transfusion     w/hip operation  . History of stomach ulcers ~ 1951  . Renal artery stenosis 2006    renal artery stent  . Bladder cancer     Bladder Cancer local  . Cellulitis of left lower extremity   . Chronic diastolic congestive heart failure   . Normal coronary arteries Sept 2012    CURRENT MEDICATIONS: Reviewed per MAR/see medication list  Allergies  Allergen Reactions  . Aspirin Anaphylaxis, Shortness Of Breath and Rash    "broke out in white welts; red blotches neck and face; windpipe closing; ~ 1962"     REVIEW OF SYSTEMS:  GENERAL: no change in appetite, no fatigue, no weight changes, no fever, chills or weakness RESPIRATORY: no cough, SOB, DOE, wheezing, hemoptysis CARDIAC: no chest pain, edema or palpitations GI: no abdominal pain, diarrhea, constipation, heart burn, nausea or vomiting  PHYSICAL EXAMINATION  GENERAL: no acute distress, normal body habitus EYES: conjunctivae normal, sclerae normal, normal eye lids NECK: supple, trachea midline, no neck masses, no thyroid tenderness, no thyromegaly LYMPHATICS: no LAN in the neck, no supraclavicular LAN RESPIRATORY: breathing is even & unlabored, BS CTAB CARDIAC: RRR, no murmur,no extra heart sounds, no edema GI: abdomen soft, normal BS, no masses, no tenderness, no hepatomegaly, no splenomegaly EXTREMITIES: able to move all 4 ex PSYCHIATRIC: the patient is  alert & oriented to person, affect & behavior appropriate  LABS/RADIOLOGY: Labs reviewed: Basic Metabolic Panel:  Recent Labs  12/14/13 0435 12/15/13 0401 12/16/13 0355  NA 140 135* 138  K 3.1* 3.3* 3.4*  CL 101 97 100  CO2 28 28 28   GLUCOSE 127* 288* 290*  BUN 18 19 26*  CREATININE 1.12 1.10 1.19  CALCIUM 8.5 8.0* 8.3*   Liver Function Tests:  Recent Labs  04/11/13 0050  AST 25  ALT 28  ALKPHOS 88    BILITOT 0.7  PROT 7.3  ALBUMIN 3.7   CBC:  Recent Labs  04/11/13 0050  12/12/13 0045 12/14/13 0435 12/15/13 0401 12/16/13 0355  WBC 18.4*  < > 12.5* 10.6* 11.5* 10.2  NEUTROABS 16.0*  --  10.6*  --   --   --   HGB 14.3  < > 11.9* 8.4* 8.3* 7.7*  HCT 41.2  < > 34.5* 24.2* 23.7* 21.9*  MCV 92.8  < > 92.0 91.7 89.4 89.0  PLT 87*  < > 79* 71* 77* 94*  < > = values in this interval not displayed.   Cardiac Enzymes:  Recent Labs  04/11/13 0050  TROPONINI <0.30   CBG:  Recent Labs  12/16/13 0422 12/16/13 0753 12/16/13 1143  GLUCAP 286* 268* 301*    Dg Chest 1 View  12/12/2013   CLINICAL DATA:  Preop for left hip fracture initial encounter  EXAM: CHEST - 1 VIEW  COMPARISON:  08/27/2013  FINDINGS: Prominent left ventricular size. The patient is status post median sternotomy for left atrial appendage exclusion and aortic valve replacement. Aortic contours are stable. Pulmonary hyperinflation consistent with COPD. There is no edema, consolidation, effusion, or pneumothorax.  IMPRESSION: 1.  No active disease. 2. Chronic and postoperative changes described above.   Electronically Signed   By: Jorje Guild M.D.   On: 12/12/2013 00:01   Dg Hip Complete Left  12/12/2013   CLINICAL DATA:  80 year old male post fall with left hip pain. Initial encounter.  EXAM: LEFT HIP - COMPLETE 2+ VIEW  COMPARISON:  None.  FINDINGS: Slightly separated 4 part left intertrochanteric fracture.  Remote right hip fracture treated with internal fixation.  Iliac stents in place.  IMPRESSION: Slightly separated 4 part left intertrochanteric fracture.   Electronically Signed   By: Chauncey Cruel M.D.   On: 12/12/2013 00:01   Dg Hip Operative Left  12/14/2013   CLINICAL DATA:  Fracture fixation left hip.  EXAM: OPERATIVE LEFT HIP  COMPARISON:  Plain films left hip 12/11/2013.  FINDINGS: We are provided with 3 fluoroscopic intraoperative spot views of the left hip. Images demonstrate a dynamic hip screw  and short IM nail with a single distal interlocking screw for fixation of an intertrochanteric fracture. Hardware is intact. No new abnormality is identified.  IMPRESSION: ORIF left intertrochanteric fracture.  No acute finding.   Electronically Signed   By: Inge Rise M.D.   On: 12/14/2013 15:29    ASSESSMENT/PLAN:  Left intertrochanteric fracture ofleft femur S/P IM Nail Femoral - for rehabilitation Diabetes Mellitus, type2 continue Glipizide and Lantus Anemia, acute blood loss - continue FeSO4 Atrial Fibrillation -rate-controlled - continue Metoprolol and Coumadin Hyperlipidemia - continue Zocor Hypertension -well-controlled; continue Benazepril, Lasix and Metoprolol Allergic Rhinitis - continue Flonase   CPT CODE: 64403    MEDINA-VARGAS,MONINA, NP [place]

## 2013-12-21 ENCOUNTER — Non-Acute Institutional Stay (SKILLED_NURSING_FACILITY): Payer: Medicare Other | Admitting: Internal Medicine

## 2013-12-21 DIAGNOSIS — E1165 Type 2 diabetes mellitus with hyperglycemia: Secondary | ICD-10-CM

## 2013-12-21 DIAGNOSIS — S72142S Displaced intertrochanteric fracture of left femur, sequela: Secondary | ICD-10-CM | POA: Diagnosis not present

## 2013-12-21 DIAGNOSIS — D62 Acute posthemorrhagic anemia: Secondary | ICD-10-CM | POA: Diagnosis not present

## 2013-12-21 DIAGNOSIS — IMO0002 Reserved for concepts with insufficient information to code with codable children: Secondary | ICD-10-CM

## 2013-12-21 DIAGNOSIS — I482 Chronic atrial fibrillation, unspecified: Secondary | ICD-10-CM

## 2013-12-23 NOTE — Progress Notes (Signed)
HISTORY & PHYSICAL  DATE: 12/21/2013   FACILITY: [facility]  LEVEL OF CARE: SNF (31)  ALLERGIES:  Allergies  Allergen Reactions  . Aspirin Anaphylaxis, Shortness Of Breath and Rash    "broke out in white welts; red blotches neck and face; windpipe closing; ~ 1962"    CHIEF COMPLAINT:  Manage left femur fx, atrial fibrillation & DM  HISTORY OF PRESENT ILLNESS: Ronald [name] is an 80 y/o Caucasian male:  HIP FRACTURE: The patient had a mechanical fall and sustained a femur fracture.  Patient subsequently underwent surgical repair and tolerated the procedure well. Patient is admitted to this facility for short-term rehabilitation. Patient denies hip pain currently. No complications reported from the pain medications currently being used.  ATRIAL FIBRILLATION: the patients atrial fibrillation remains stable.  The patient denies DOE, tachycardia, orthopnea, transient neurological sx, pedal edema, palpitations, & PNDs.  No complications noted from the medications currently being used.  DM:Ronald [name]'s DM remains stable.  Ronald [name] denies polyuria, polydipsia, polyphagia, changes in vision or hypoglycemic episodes.  No complications noted from the medication presently being used.  Last hemoglobin A1c is:8.1.  PAST MEDICAL HISTORY :  Past Medical History  Diagnosis Date  . AS (aortic stenosis)     bovine aortic valve replacement 01/07/11  . HTN (hypertension)   . Thrombocytopenia due to drugs     seen by Dr Inda Merlin plts [id number] no rx  . Peripheral vascular disease very poor circulation legs and feet ... stents right and left legs... done in dr j. Gwenlyn Found 's office.   . Depression wife died 4 years ago.    Marland Kitchen COPD (chronic obstructive pulmonary disease)   . Shortness of breath   . Recurrent upper respiratory infection (URI)     sinusitis  . Claudication in peripheral vascular disease 06/17/2011  . S/P angioplasty with stent, diamond back rotational athrectomy Prox. Rt. SFA 06/17/2011  06/17/2011  . Neuropathy due to secondary diabetes   . Type II diabetes mellitus   . Pneumonia 07/25/11    left  . Persistent atrial fibrillation   . Blood transfusion     w/hip operation  . History of stomach ulcers ~ 1951  . Renal artery stenosis 2006    renal artery stent  . Bladder cancer     Bladder Cancer local  . Cellulitis of left lower extremity   . Chronic diastolic congestive heart failure   . Normal coronary arteries Sept 2012    PAST SURGICAL HISTORY: Past Surgical History  Procedure Laterality Date  . Peripheral arterial stent graft      2006 left anf right illiac stents Dr Deon Pilling  . Aortic valve replacement  01/07/2011    Procedure: AORTIC VALVE REPLACEMENT (AVR);  Surgeon: Grace Isaac, MD;  Location: [facility];  Service: Open Heart Surgery;  Laterality: N/A;; magna-ease bovine 22mm bioprosthesis  . Cardioversion  04/03/2011    Procedure: CARDIOVERSION;  Surgeon: Leonie Man, MD;  Location: [facility];  Service: Cardiovascular;  Laterality: N/A;  . Lower extremity angiogram  06/17/2011    diamondback orbital rotational and cutting balloon atherectomy of the prox R SFA  . Femur im nail  06/27/2011    Procedure: INTRAMEDULLARY (IM) NAIL FEMORAL;  Surgeon: Marin Shutter, MD;  Location: [facility];  Service: Orthopedics;  Laterality: Right;  . Tonsillectomy and adenoidectomy      "when I was a kid"  . Cholecystectomy    . Cataract extraction w/  intraocular lens  implant, bilateral  ~ 2007  . Renal artery stent  2006    "I believe"  . Cardiac catheterization  11/19/10    normal coronaries, mod AS, 75% l RAS  . Femur im nail Left 12/14/2013    Procedure: INTRAMEDULLARY (IM) NAIL FEMORAL;  Surgeon: Mauri Pole, MD;  Location: [facility];  Service: Orthopedics;  Laterality: Left;    SOCIAL HISTORY:  reports that he has been smoking Cigarettes.  He has a 150 pack-year smoking history. He has never used smokeless tobacco. He reports that he drinks about 1.2 ounces of alcohol per  week. He reports that he does not use illicit drugs.  FAMILY HISTORY:  Family History  Problem Relation Age of Onset  . Heart disease Mother   . Cancer Brother     colon  . Stroke Father     CURRENT MEDICATIONS: Reviewed per MAR/see medication list  REVIEW OF SYSTEMS:  See HPI otherwise 14 point ROS is negative.  PHYSICAL EXAMINATION  VS:  See VS section  GENERAL: no acute distress, normal body habitus EYES: conjunctivae normal, sclerae normal, normal eye lids MOUTH/THROAT: lips without lesions,no lesions in the mouth,tongue is without lesions,uvula elevates in midline NECK: supple, trachea midline, no neck masses, no thyroid tenderness, no thyromegaly LYMPHATICS: no LAN in the neck, no supraclavicular LAN RESPIRATORY: breathing is even & unlabored, BS CTAB CARDIAC: heart rate is irregularly irregular, no murmur,no extra heart sounds, +1 BLE edema GI:  ABDOMEN: abdomen soft, normal BS, no masses, no tenderness  LIVER/SPLEEN: no hepatomegaly, no splenomegaly MUSCULOSKELETAL: HEAD: normal to inspection  EXTREMITIES: LEFT UPPER EXTREMITY: full range of motion, normal strength & tone RIGHT UPPER EXTREMITY:  full range of motion, normal strength & tone LEFT LOWER EXTREMITY:   range of motion not tested due to surgery, normal strength & tone RIGHT LOWER EXTREMITY: moderate range of motion, normal strength & tone PSYCHIATRIC: the patient is alert & oriented to person, affect & behavior appropriate  LABS/RADIOLOGY:  Labs reviewed: Basic Metabolic Panel:  Recent Labs  12/14/13 0435 12/15/13 0401 12/16/13 0355  NA 140 135* 138  K 3.1* 3.3* 3.4*  CL 101 97 100  CO2 28 28 28   GLUCOSE 127* 288* 290*  BUN 18 19 26*  CREATININE 1.12 1.10 1.19  CALCIUM 8.5 8.0* 8.3*   Liver Function Tests:  Recent Labs  04/11/13 0050  AST 25  ALT 28  ALKPHOS 88  BILITOT 0.7  PROT 7.3  ALBUMIN 3.7   CBC:  Recent Labs  04/11/13 0050  12/12/13 0045 12/14/13 0435 12/15/13 0401  12/16/13 0355  WBC 18.4*  < > 12.5* 10.6* 11.5* 10.2  NEUTROABS 16.0*  --  10.6*  --   --   --   HGB 14.3  < > 11.9* 8.4* 8.3* 7.7*  HCT 41.2  < > 34.5* 24.2* 23.7* 21.9*  MCV 92.8  < > 92.0 91.7 89.4 89.0  PLT 87*  < > 79* 71* 77* 94*  < > = values in this interval not displayed.  Cardiac Enzymes:  Recent Labs  04/11/13 0050  TROPONINI <0.30   CBG:  Recent Labs  12/16/13 0422 12/16/13 0753 12/16/13 1143  GLUCAP 286* 268* 301*    CHEST - 1 VIEW   COMPARISON:  08/27/2013   FINDINGS: Prominent left ventricular size. The patient is status post median sternotomy for left atrial appendage exclusion and aortic valve replacement. Aortic contours are stable. Pulmonary hyperinflation consistent with COPD. There is no  edema, consolidation, effusion, or pneumothorax.   IMPRESSION: 1.  No active disease. 2. Chronic and postoperative changes described above LEFT HIP - COMPLETE 2+ VIEW   COMPARISON:  None.   FINDINGS: Slightly separated 4 part left intertrochanteric fracture.   Remote right hip fracture treated with internal fixation.   Iliac stents in place.   IMPRESSION: Slightly separated 4 part left intertrochanteric fracture. OPERATIVE LEFT HIP   COMPARISON:  Plain films left hip 12/11/2013.   FINDINGS: We are provided with 3 fluoroscopic intraoperative spot views of the left hip. Images demonstrate a dynamic hip screw and short IM nail with a single distal interlocking screw for fixation of an intertrochanteric fracture. Hardware is intact. No new abnormality is identified.   IMPRESSION: ORIF left intertrochanteric fracture.  No acute finding.   ASSESSMENT/PLAN:  Left femur fx-s/p fixation.  Cont. Rehabilitation. DM-not well controlled.  Monitor cbgs.  Cont. lantus & glyburide Atrial fibrillation-rate controlled Acute blood loss anemia-recheck Hb.  Cont. Fe. HTN-well controlled. BPH- cont. Current meds. Check cbc  I have reviewed patient's  medical records received at admission/from hospitalization.  CPT CODE: 80221  Ronald [name], [place] [phone]

## 2013-12-28 DIAGNOSIS — D649 Anemia, unspecified: Secondary | ICD-10-CM | POA: Diagnosis not present

## 2014-01-03 ENCOUNTER — Encounter: Payer: Self-pay | Admitting: Adult Health

## 2014-01-03 ENCOUNTER — Non-Acute Institutional Stay (SKILLED_NURSING_FACILITY): Payer: Medicare Other | Admitting: Adult Health

## 2014-01-03 DIAGNOSIS — IMO0002 Reserved for concepts with insufficient information to code with codable children: Secondary | ICD-10-CM

## 2014-01-03 DIAGNOSIS — E1165 Type 2 diabetes mellitus with hyperglycemia: Secondary | ICD-10-CM | POA: Diagnosis not present

## 2014-01-03 DIAGNOSIS — I10 Essential (primary) hypertension: Secondary | ICD-10-CM

## 2014-01-03 DIAGNOSIS — E785 Hyperlipidemia, unspecified: Secondary | ICD-10-CM

## 2014-01-03 DIAGNOSIS — I482 Chronic atrial fibrillation, unspecified: Secondary | ICD-10-CM

## 2014-01-03 DIAGNOSIS — S72142S Displaced intertrochanteric fracture of left femur, sequela: Secondary | ICD-10-CM

## 2014-01-03 DIAGNOSIS — J309 Allergic rhinitis, unspecified: Secondary | ICD-10-CM

## 2014-01-03 DIAGNOSIS — D62 Acute posthemorrhagic anemia: Secondary | ICD-10-CM

## 2014-01-03 NOTE — Progress Notes (Signed)
[name], male   DOB: 24-Aug-1922, 80 y.o.   MRN: [id number]   01/03/2014  Facility:  [facility] Location:  [place] Room Number: 1006-2 LEVEL OF CARE:  SNF (31)   Chief Complaint  Patient presents with  . Discharge Note    Left intertrochanteric fracture of left femur S/P IM Nail, Diabetes Mellitus, Anemia, Atrial Fibrillation, Hyperlipidemia, Hypertension and Allergic rhinitis    HISTORY OF PRESENT ILLNESS:  This is an 80 year oldmale who is for discharge home with Home health PT, OT, Nursing and Home health Aide. He has been admitted to [facility] on 12/16/13 from [facility] with fracture of left femur S/P IM Nail femoral. Patient was admitted to this facility for short-term rehabilitation after the patient's recent hospitalization.  Patient has completed SNF rehabilitation and therapy has cleared the patient for discharge.  REASSESSMENT OF ONGOING PROBLEMS:  HTN: Pt 's HTN remains stable.  Denies CP, sob, DOE, pedal edema, headaches, dizziness or visual disturbances.  No complications from the medications currently being used.  Last BP : 130/80  ANEMIA: The anemia has been stable. The patient denies fatigue, melena or hematochezia. No complications from the medications currently being used. 10/15 hgb 8.9  ALLERGIC RHINITIS: Allergic rhinitis remains stable.  Patient denies ongoing symptoms such as runny nose sneezing or tearing. No complications reported from the current medication(s) being used.   PAST MEDICAL HISTORY:  Past Medical History  Diagnosis Date  . AS (aortic stenosis)     bovine aortic valve replacement 01/07/11  . HTN (hypertension)   . Thrombocytopenia due to drugs     seen by Dr Inda Merlin plts [id number] no rx  . Peripheral vascular disease very poor circulation legs and feet ... stents right and left legs... done in dr j. Gwenlyn Found 's office.   . Depression wife died 4 years ago.    Marland Kitchen COPD (chronic  obstructive pulmonary disease)   . Shortness of breath   . Recurrent upper respiratory infection (URI)     sinusitis  . Claudication in peripheral vascular disease 06/17/2011  . S/P angioplasty with stent, diamond back rotational athrectomy Prox. Rt. SFA 06/17/2011 06/17/2011  . Neuropathy due to secondary diabetes   . Type II diabetes mellitus   . Pneumonia 07/25/11    left  . Persistent atrial fibrillation   . Blood transfusion     w/hip operation  . History of stomach ulcers ~ 1951  . Renal artery stenosis 2006    renal artery stent  . Bladder cancer     Bladder Cancer local  . Cellulitis of left lower extremity   . Chronic diastolic congestive heart failure   . Normal coronary arteries Sept 2012    CURRENT MEDICATIONS: Reviewed per MAR/see medication list  Allergies  Allergen Reactions  . Aspirin Anaphylaxis, Shortness Of Breath and Rash    "broke out in white welts; red blotches neck and face; windpipe closing; ~ 1962"     REVIEW OF SYSTEMS:  GENERAL: no change in appetite, no fatigue, no weight changes, no fever, chills or weakness RESPIRATORY: no cough, SOB, DOE, wheezing, hemoptysis CARDIAC: no chest pain, edema or palpitations GI: no abdominal pain, diarrhea, constipation, heart burn, nausea or vomiting  PHYSICAL EXAMINATION  GENERAL: no acute distress, normal body habitus NECK: supple, trachea midline, no neck masses, no thyroid tenderness, no thyromegaly LYMPHATICS: no LAN in the neck, no supraclavicular LAN RESPIRATORY: breathing is even & unlabored, BS CTAB  CARDIAC: RRR, no murmur,no extra heart sounds, no edema GI: abdomen soft, normal BS, no masses, no tenderness, no hepatomegaly, no splenomegaly EXTREMITIES: able to move all 4 extremities PSYCHIATRIC: the patient is alert & oriented to person, affect & behavior appropriate  LABS/RADIOLOGY: 12/22/13  WBC 9.3 hemoglobin 8.9 hematocrit 28.7 MCV 100.7 12/21/13  WBC 9.4 hemoglobin 7.3 hematocrit 32.8 MCV  96.2 12/17/13  Chest x-ray shows no acute cardiopulmonary abnormality evident Labs reviewed: Basic Metabolic Panel:  Recent Labs  12/14/13 0435 12/15/13 0401 12/16/13 0355  NA 140 135* 138  K 3.1* 3.3* 3.4*  CL 101 97 100  CO2 28 28 28   GLUCOSE 127* 288* 290*  BUN 18 19 26*  CREATININE 1.12 1.10 1.19  CALCIUM 8.5 8.0* 8.3*   Liver Function Tests:  Recent Labs  04/11/13 0050  AST 25  ALT 28  ALKPHOS 88  BILITOT 0.7  PROT 7.3  ALBUMIN 3.7   CBC:  Recent Labs  04/11/13 0050  12/12/13 0045 12/14/13 0435 12/15/13 0401 12/16/13 0355  WBC 18.4*  < > 12.5* 10.6* 11.5* 10.2  NEUTROABS 16.0*  --  10.6*  --   --   --   HGB 14.3  < > 11.9* 8.4* 8.3* 7.7*  HCT 41.2  < > 34.5* 24.2* 23.7* 21.9*  MCV 92.8  < > 92.0 91.7 89.4 89.0  PLT 87*  < > 79* 71* 77* 94*  < > = values in this interval not displayed.   Cardiac Enzymes:  Recent Labs  04/11/13 0050  TROPONINI <0.30   CBG:  Recent Labs  12/16/13 0422 12/16/13 0753 12/16/13 1143  GLUCAP 286* 268* 301*    Dg Chest 1 View  12/12/2013   CLINICAL DATA:  Preop for left hip fracture initial encounter  EXAM: CHEST - 1 VIEW  COMPARISON:  08/27/2013  FINDINGS: Prominent left ventricular size. The patient is status post median sternotomy for left atrial appendage exclusion and aortic valve replacement. Aortic contours are stable. Pulmonary hyperinflation consistent with COPD. There is no edema, consolidation, effusion, or pneumothorax.  IMPRESSION: 1.  No active disease. 2. Chronic and postoperative changes described above.   Electronically Signed   By: Jorje Guild M.D.   On: 12/12/2013 00:01   Dg Hip Complete Left  12/12/2013   CLINICAL DATA:  80 year old male post fall with left hip pain. Initial encounter.  EXAM: LEFT HIP - COMPLETE 2+ VIEW  COMPARISON:  None.  FINDINGS: Slightly separated 4 part left intertrochanteric fracture.  Remote right hip fracture treated with internal fixation.  Iliac stents in place.   IMPRESSION: Slightly separated 4 part left intertrochanteric fracture.   Electronically Signed   By: Chauncey Cruel M.D.   On: 12/12/2013 00:01   Dg Hip Operative Left  12/14/2013   CLINICAL DATA:  Fracture fixation left hip.  EXAM: OPERATIVE LEFT HIP  COMPARISON:  Plain films left hip 12/11/2013.  FINDINGS: We are provided with 3 fluoroscopic intraoperative spot views of the left hip. Images demonstrate a dynamic hip screw and short IM nail with a single distal interlocking screw for fixation of an intertrochanteric fracture. Hardware is intact. No new abnormality is identified.  IMPRESSION: ORIF left intertrochanteric fracture.  No acute finding.   Electronically Signed   By: Inge Rise M.D.   On: 12/14/2013 15:29    ASSESSMENT/PLAN:  Left intertrochanteric fracture ofleft femur S/P IM Nail Femoral - for home health PT, OT, nursing and home health aide Diabetes Mellitus, type2  - well  controlled;continue Glipizide and Lantus Anemia, acute blood loss - stable; hemoglobin 8.9;continue FeSO4 Atrial Fibrillation -rate-controlled - continue Metoprolol and Coumadin2.5 mg by mouth daily; check INR 01/04/14 Hyperlipidemia - continue Zocor Hypertension -well-controlled; continue Benazepril, Lasix and Metoprolol Allergic Rhinitis - stable;continue Flonase    I have filled out patient's discharge paperwork and written prescriptions.  Patient will receive home health PT, OT, Nursing and home health aide.  Total discharge time: Less than 30 minutes  Discharge time involved coordination of the discharge process with Education officer, museum, nursing staff and therapy department. Medical justification for home health services verified.     CPT CODE: 75051    [name],Ronald Grape, [facility] [phone]

## 2014-01-08 DIAGNOSIS — D6959 Other secondary thrombocytopenia: Secondary | ICD-10-CM | POA: Diagnosis not present

## 2014-01-08 DIAGNOSIS — E1165 Type 2 diabetes mellitus with hyperglycemia: Secondary | ICD-10-CM | POA: Diagnosis not present

## 2014-01-08 DIAGNOSIS — I5032 Chronic diastolic (congestive) heart failure: Secondary | ICD-10-CM | POA: Diagnosis not present

## 2014-01-08 DIAGNOSIS — E1140 Type 2 diabetes mellitus with diabetic neuropathy, unspecified: Secondary | ICD-10-CM | POA: Diagnosis not present

## 2014-01-08 DIAGNOSIS — S72142D Displaced intertrochanteric fracture of left femur, subsequent encounter for closed fracture with routine healing: Secondary | ICD-10-CM | POA: Diagnosis not present

## 2014-01-08 DIAGNOSIS — I4891 Unspecified atrial fibrillation: Secondary | ICD-10-CM | POA: Diagnosis not present

## 2014-01-11 DIAGNOSIS — I4891 Unspecified atrial fibrillation: Secondary | ICD-10-CM | POA: Diagnosis not present

## 2014-01-11 DIAGNOSIS — Z7901 Long term (current) use of anticoagulants: Secondary | ICD-10-CM | POA: Diagnosis not present

## 2014-01-12 DIAGNOSIS — E1140 Type 2 diabetes mellitus with diabetic neuropathy, unspecified: Secondary | ICD-10-CM | POA: Diagnosis not present

## 2014-01-12 DIAGNOSIS — D6959 Other secondary thrombocytopenia: Secondary | ICD-10-CM | POA: Diagnosis not present

## 2014-01-12 DIAGNOSIS — I4891 Unspecified atrial fibrillation: Secondary | ICD-10-CM | POA: Diagnosis not present

## 2014-01-12 DIAGNOSIS — S72142D Displaced intertrochanteric fracture of left femur, subsequent encounter for closed fracture with routine healing: Secondary | ICD-10-CM | POA: Diagnosis not present

## 2014-01-12 DIAGNOSIS — E1165 Type 2 diabetes mellitus with hyperglycemia: Secondary | ICD-10-CM | POA: Diagnosis not present

## 2014-01-12 DIAGNOSIS — I5032 Chronic diastolic (congestive) heart failure: Secondary | ICD-10-CM | POA: Diagnosis not present

## 2014-01-14 DIAGNOSIS — S72142D Displaced intertrochanteric fracture of left femur, subsequent encounter for closed fracture with routine healing: Secondary | ICD-10-CM | POA: Diagnosis not present

## 2014-01-14 DIAGNOSIS — I5032 Chronic diastolic (congestive) heart failure: Secondary | ICD-10-CM | POA: Diagnosis not present

## 2014-01-14 DIAGNOSIS — E1165 Type 2 diabetes mellitus with hyperglycemia: Secondary | ICD-10-CM | POA: Diagnosis not present

## 2014-01-14 DIAGNOSIS — E1140 Type 2 diabetes mellitus with diabetic neuropathy, unspecified: Secondary | ICD-10-CM | POA: Diagnosis not present

## 2014-01-14 DIAGNOSIS — D6959 Other secondary thrombocytopenia: Secondary | ICD-10-CM | POA: Diagnosis not present

## 2014-01-14 DIAGNOSIS — I4891 Unspecified atrial fibrillation: Secondary | ICD-10-CM | POA: Diagnosis not present

## 2014-01-17 DIAGNOSIS — D6959 Other secondary thrombocytopenia: Secondary | ICD-10-CM | POA: Diagnosis not present

## 2014-01-17 DIAGNOSIS — S72142D Displaced intertrochanteric fracture of left femur, subsequent encounter for closed fracture with routine healing: Secondary | ICD-10-CM | POA: Diagnosis not present

## 2014-01-17 DIAGNOSIS — E1140 Type 2 diabetes mellitus with diabetic neuropathy, unspecified: Secondary | ICD-10-CM | POA: Diagnosis not present

## 2014-01-17 DIAGNOSIS — I5032 Chronic diastolic (congestive) heart failure: Secondary | ICD-10-CM | POA: Diagnosis not present

## 2014-01-17 DIAGNOSIS — E1165 Type 2 diabetes mellitus with hyperglycemia: Secondary | ICD-10-CM | POA: Diagnosis not present

## 2014-01-17 DIAGNOSIS — I4891 Unspecified atrial fibrillation: Secondary | ICD-10-CM | POA: Diagnosis not present

## 2014-01-19 DIAGNOSIS — I5032 Chronic diastolic (congestive) heart failure: Secondary | ICD-10-CM | POA: Diagnosis not present

## 2014-01-19 DIAGNOSIS — E1165 Type 2 diabetes mellitus with hyperglycemia: Secondary | ICD-10-CM | POA: Diagnosis not present

## 2014-01-19 DIAGNOSIS — S72142D Displaced intertrochanteric fracture of left femur, subsequent encounter for closed fracture with routine healing: Secondary | ICD-10-CM | POA: Diagnosis not present

## 2014-01-19 DIAGNOSIS — I4891 Unspecified atrial fibrillation: Secondary | ICD-10-CM | POA: Diagnosis not present

## 2014-01-19 DIAGNOSIS — D6959 Other secondary thrombocytopenia: Secondary | ICD-10-CM | POA: Diagnosis not present

## 2014-01-19 DIAGNOSIS — E1140 Type 2 diabetes mellitus with diabetic neuropathy, unspecified: Secondary | ICD-10-CM | POA: Diagnosis not present

## 2014-01-19 DIAGNOSIS — Z4789 Encounter for other orthopedic aftercare: Secondary | ICD-10-CM | POA: Diagnosis not present

## 2014-01-25 DIAGNOSIS — D6959 Other secondary thrombocytopenia: Secondary | ICD-10-CM | POA: Diagnosis not present

## 2014-01-25 DIAGNOSIS — E1165 Type 2 diabetes mellitus with hyperglycemia: Secondary | ICD-10-CM | POA: Diagnosis not present

## 2014-01-25 DIAGNOSIS — S72142D Displaced intertrochanteric fracture of left femur, subsequent encounter for closed fracture with routine healing: Secondary | ICD-10-CM | POA: Diagnosis not present

## 2014-01-25 DIAGNOSIS — E1140 Type 2 diabetes mellitus with diabetic neuropathy, unspecified: Secondary | ICD-10-CM | POA: Diagnosis not present

## 2014-01-25 DIAGNOSIS — I4891 Unspecified atrial fibrillation: Secondary | ICD-10-CM | POA: Diagnosis not present

## 2014-01-25 DIAGNOSIS — I5032 Chronic diastolic (congestive) heart failure: Secondary | ICD-10-CM | POA: Diagnosis not present

## 2014-01-26 DIAGNOSIS — I4891 Unspecified atrial fibrillation: Secondary | ICD-10-CM | POA: Diagnosis not present

## 2014-01-26 DIAGNOSIS — E1165 Type 2 diabetes mellitus with hyperglycemia: Secondary | ICD-10-CM | POA: Diagnosis not present

## 2014-01-26 DIAGNOSIS — E1140 Type 2 diabetes mellitus with diabetic neuropathy, unspecified: Secondary | ICD-10-CM | POA: Diagnosis not present

## 2014-01-26 DIAGNOSIS — D6959 Other secondary thrombocytopenia: Secondary | ICD-10-CM | POA: Diagnosis not present

## 2014-01-26 DIAGNOSIS — S72142D Displaced intertrochanteric fracture of left femur, subsequent encounter for closed fracture with routine healing: Secondary | ICD-10-CM | POA: Diagnosis not present

## 2014-01-26 DIAGNOSIS — I5032 Chronic diastolic (congestive) heart failure: Secondary | ICD-10-CM | POA: Diagnosis not present

## 2014-01-31 DIAGNOSIS — E1165 Type 2 diabetes mellitus with hyperglycemia: Secondary | ICD-10-CM | POA: Diagnosis not present

## 2014-01-31 DIAGNOSIS — D6959 Other secondary thrombocytopenia: Secondary | ICD-10-CM | POA: Diagnosis not present

## 2014-01-31 DIAGNOSIS — E1140 Type 2 diabetes mellitus with diabetic neuropathy, unspecified: Secondary | ICD-10-CM | POA: Diagnosis not present

## 2014-01-31 DIAGNOSIS — I4891 Unspecified atrial fibrillation: Secondary | ICD-10-CM | POA: Diagnosis not present

## 2014-01-31 DIAGNOSIS — S72142D Displaced intertrochanteric fracture of left femur, subsequent encounter for closed fracture with routine healing: Secondary | ICD-10-CM | POA: Diagnosis not present

## 2014-01-31 DIAGNOSIS — I5032 Chronic diastolic (congestive) heart failure: Secondary | ICD-10-CM | POA: Diagnosis not present

## 2014-02-02 DIAGNOSIS — I5032 Chronic diastolic (congestive) heart failure: Secondary | ICD-10-CM | POA: Diagnosis not present

## 2014-02-02 DIAGNOSIS — E1140 Type 2 diabetes mellitus with diabetic neuropathy, unspecified: Secondary | ICD-10-CM | POA: Diagnosis not present

## 2014-02-02 DIAGNOSIS — S72142D Displaced intertrochanteric fracture of left femur, subsequent encounter for closed fracture with routine healing: Secondary | ICD-10-CM | POA: Diagnosis not present

## 2014-02-02 DIAGNOSIS — E1165 Type 2 diabetes mellitus with hyperglycemia: Secondary | ICD-10-CM | POA: Diagnosis not present

## 2014-02-02 DIAGNOSIS — I4891 Unspecified atrial fibrillation: Secondary | ICD-10-CM | POA: Diagnosis not present

## 2014-02-02 DIAGNOSIS — D6959 Other secondary thrombocytopenia: Secondary | ICD-10-CM | POA: Diagnosis not present

## 2014-02-03 ENCOUNTER — Encounter (HOSPITAL_COMMUNITY): Payer: Self-pay | Admitting: Cardiovascular Disease

## 2014-02-04 DIAGNOSIS — I509 Heart failure, unspecified: Secondary | ICD-10-CM | POA: Diagnosis not present

## 2014-02-04 DIAGNOSIS — I10 Essential (primary) hypertension: Secondary | ICD-10-CM | POA: Diagnosis not present

## 2014-02-04 DIAGNOSIS — D649 Anemia, unspecified: Secondary | ICD-10-CM | POA: Diagnosis not present

## 2014-02-04 DIAGNOSIS — Z7901 Long term (current) use of anticoagulants: Secondary | ICD-10-CM | POA: Diagnosis not present

## 2014-02-04 DIAGNOSIS — E1151 Type 2 diabetes mellitus with diabetic peripheral angiopathy without gangrene: Secondary | ICD-10-CM | POA: Diagnosis not present

## 2014-02-04 DIAGNOSIS — E785 Hyperlipidemia, unspecified: Secondary | ICD-10-CM | POA: Diagnosis not present

## 2014-02-04 DIAGNOSIS — N183 Chronic kidney disease, stage 3 (moderate): Secondary | ICD-10-CM | POA: Diagnosis not present

## 2014-02-04 DIAGNOSIS — M545 Low back pain: Secondary | ICD-10-CM | POA: Diagnosis not present

## 2014-02-07 DIAGNOSIS — I5032 Chronic diastolic (congestive) heart failure: Secondary | ICD-10-CM | POA: Diagnosis not present

## 2014-02-07 DIAGNOSIS — D6959 Other secondary thrombocytopenia: Secondary | ICD-10-CM | POA: Diagnosis not present

## 2014-02-07 DIAGNOSIS — S72142D Displaced intertrochanteric fracture of left femur, subsequent encounter for closed fracture with routine healing: Secondary | ICD-10-CM | POA: Diagnosis not present

## 2014-02-07 DIAGNOSIS — E1165 Type 2 diabetes mellitus with hyperglycemia: Secondary | ICD-10-CM | POA: Diagnosis not present

## 2014-02-07 DIAGNOSIS — I4891 Unspecified atrial fibrillation: Secondary | ICD-10-CM | POA: Diagnosis not present

## 2014-02-07 DIAGNOSIS — E1140 Type 2 diabetes mellitus with diabetic neuropathy, unspecified: Secondary | ICD-10-CM | POA: Diagnosis not present

## 2014-02-11 DIAGNOSIS — S72142D Displaced intertrochanteric fracture of left femur, subsequent encounter for closed fracture with routine healing: Secondary | ICD-10-CM | POA: Diagnosis not present

## 2014-02-11 DIAGNOSIS — E1165 Type 2 diabetes mellitus with hyperglycemia: Secondary | ICD-10-CM | POA: Diagnosis not present

## 2014-02-11 DIAGNOSIS — E1140 Type 2 diabetes mellitus with diabetic neuropathy, unspecified: Secondary | ICD-10-CM | POA: Diagnosis not present

## 2014-02-11 DIAGNOSIS — D6959 Other secondary thrombocytopenia: Secondary | ICD-10-CM | POA: Diagnosis not present

## 2014-02-11 DIAGNOSIS — I4891 Unspecified atrial fibrillation: Secondary | ICD-10-CM | POA: Diagnosis not present

## 2014-02-11 DIAGNOSIS — I5032 Chronic diastolic (congestive) heart failure: Secondary | ICD-10-CM | POA: Diagnosis not present

## 2014-02-15 DIAGNOSIS — E1140 Type 2 diabetes mellitus with diabetic neuropathy, unspecified: Secondary | ICD-10-CM | POA: Diagnosis not present

## 2014-02-15 DIAGNOSIS — D6959 Other secondary thrombocytopenia: Secondary | ICD-10-CM | POA: Diagnosis not present

## 2014-02-15 DIAGNOSIS — I4891 Unspecified atrial fibrillation: Secondary | ICD-10-CM | POA: Diagnosis not present

## 2014-02-15 DIAGNOSIS — E1165 Type 2 diabetes mellitus with hyperglycemia: Secondary | ICD-10-CM | POA: Diagnosis not present

## 2014-02-15 DIAGNOSIS — I5032 Chronic diastolic (congestive) heart failure: Secondary | ICD-10-CM | POA: Diagnosis not present

## 2014-02-15 DIAGNOSIS — S72142D Displaced intertrochanteric fracture of left femur, subsequent encounter for closed fracture with routine healing: Secondary | ICD-10-CM | POA: Diagnosis not present

## 2014-02-16 DIAGNOSIS — I4891 Unspecified atrial fibrillation: Secondary | ICD-10-CM | POA: Diagnosis not present

## 2014-02-16 DIAGNOSIS — I5032 Chronic diastolic (congestive) heart failure: Secondary | ICD-10-CM | POA: Diagnosis not present

## 2014-02-16 DIAGNOSIS — E1165 Type 2 diabetes mellitus with hyperglycemia: Secondary | ICD-10-CM | POA: Diagnosis not present

## 2014-02-16 DIAGNOSIS — S72142D Displaced intertrochanteric fracture of left femur, subsequent encounter for closed fracture with routine healing: Secondary | ICD-10-CM | POA: Diagnosis not present

## 2014-02-16 DIAGNOSIS — E1140 Type 2 diabetes mellitus with diabetic neuropathy, unspecified: Secondary | ICD-10-CM | POA: Diagnosis not present

## 2014-02-16 DIAGNOSIS — D6959 Other secondary thrombocytopenia: Secondary | ICD-10-CM | POA: Diagnosis not present

## 2014-02-22 DIAGNOSIS — Z6824 Body mass index (BMI) 24.0-24.9, adult: Secondary | ICD-10-CM | POA: Diagnosis not present

## 2014-02-22 DIAGNOSIS — S80811A Abrasion, right lower leg, initial encounter: Secondary | ICD-10-CM | POA: Diagnosis not present

## 2014-02-22 DIAGNOSIS — I509 Heart failure, unspecified: Secondary | ICD-10-CM | POA: Diagnosis not present

## 2014-03-02 DIAGNOSIS — S80811D Abrasion, right lower leg, subsequent encounter: Secondary | ICD-10-CM | POA: Diagnosis not present

## 2014-03-02 DIAGNOSIS — Z7901 Long term (current) use of anticoagulants: Secondary | ICD-10-CM | POA: Diagnosis not present

## 2014-03-02 DIAGNOSIS — I4891 Unspecified atrial fibrillation: Secondary | ICD-10-CM | POA: Diagnosis not present

## 2014-03-03 ENCOUNTER — Other Ambulatory Visit: Payer: Self-pay | Admitting: Adult Health

## 2014-03-17 ENCOUNTER — Other Ambulatory Visit: Payer: Self-pay | Admitting: Adult Health

## 2014-03-25 DIAGNOSIS — N183 Chronic kidney disease, stage 3 (moderate): Secondary | ICD-10-CM | POA: Diagnosis not present

## 2014-03-25 DIAGNOSIS — Z7901 Long term (current) use of anticoagulants: Secondary | ICD-10-CM | POA: Diagnosis not present

## 2014-03-25 DIAGNOSIS — I509 Heart failure, unspecified: Secondary | ICD-10-CM | POA: Diagnosis not present

## 2014-03-25 DIAGNOSIS — D649 Anemia, unspecified: Secondary | ICD-10-CM | POA: Diagnosis not present

## 2014-03-25 DIAGNOSIS — E1151 Type 2 diabetes mellitus with diabetic peripheral angiopathy without gangrene: Secondary | ICD-10-CM | POA: Diagnosis not present

## 2014-03-25 DIAGNOSIS — Z1389 Encounter for screening for other disorder: Secondary | ICD-10-CM | POA: Diagnosis not present

## 2014-03-25 DIAGNOSIS — I4891 Unspecified atrial fibrillation: Secondary | ICD-10-CM | POA: Diagnosis not present

## 2014-03-25 DIAGNOSIS — J449 Chronic obstructive pulmonary disease, unspecified: Secondary | ICD-10-CM | POA: Diagnosis not present

## 2014-03-25 DIAGNOSIS — F329 Major depressive disorder, single episode, unspecified: Secondary | ICD-10-CM | POA: Diagnosis not present

## 2014-03-25 DIAGNOSIS — Z6825 Body mass index (BMI) 25.0-25.9, adult: Secondary | ICD-10-CM | POA: Diagnosis not present

## 2014-03-25 DIAGNOSIS — R627 Adult failure to thrive: Secondary | ICD-10-CM | POA: Diagnosis not present

## 2014-03-25 DIAGNOSIS — M545 Low back pain: Secondary | ICD-10-CM | POA: Diagnosis not present

## 2014-04-11 ENCOUNTER — Other Ambulatory Visit: Payer: Self-pay | Admitting: Adult Health

## 2014-04-26 DIAGNOSIS — Z7901 Long term (current) use of anticoagulants: Secondary | ICD-10-CM | POA: Diagnosis not present

## 2014-05-03 DIAGNOSIS — Z7901 Long term (current) use of anticoagulants: Secondary | ICD-10-CM | POA: Diagnosis not present

## 2014-05-03 DIAGNOSIS — I4891 Unspecified atrial fibrillation: Secondary | ICD-10-CM | POA: Diagnosis not present

## 2014-05-06 DIAGNOSIS — I4891 Unspecified atrial fibrillation: Secondary | ICD-10-CM | POA: Diagnosis not present

## 2014-05-06 DIAGNOSIS — Z7901 Long term (current) use of anticoagulants: Secondary | ICD-10-CM | POA: Diagnosis not present

## 2014-05-12 ENCOUNTER — Ambulatory Visit (INDEPENDENT_AMBULATORY_CARE_PROVIDER_SITE_OTHER)
Admission: RE | Admit: 2014-05-12 | Discharge: 2014-05-12 | Disposition: A | Discharge status: Home / Self Care | Payer: Medicare Other | Source: Ambulatory Visit | Attending: Emergency Medicine | Admitting: Emergency Medicine

## 2014-05-12 DIAGNOSIS — R918 Other nonspecific abnormal finding of lung field: Secondary | ICD-10-CM

## 2014-05-13 DIAGNOSIS — Z7901 Long term (current) use of anticoagulants: Secondary | ICD-10-CM | POA: Diagnosis not present

## 2014-05-13 DIAGNOSIS — I4891 Unspecified atrial fibrillation: Secondary | ICD-10-CM | POA: Diagnosis not present

## 2014-05-17 DIAGNOSIS — Z86008 Personal history of in-situ neoplasm of other site: Secondary | ICD-10-CM | POA: Diagnosis not present

## 2014-05-17 DIAGNOSIS — H6121 Impacted cerumen, right ear: Secondary | ICD-10-CM | POA: Diagnosis not present

## 2014-05-17 DIAGNOSIS — N3943 Post-void dribbling: Secondary | ICD-10-CM | POA: Diagnosis not present

## 2014-05-17 DIAGNOSIS — H60331 Swimmer's ear, right ear: Secondary | ICD-10-CM | POA: Diagnosis not present

## 2014-05-19 ENCOUNTER — Telehealth: Payer: Self-pay | Admitting: Emergency Medicine

## 2014-05-19 NOTE — Telephone Encounter (Signed)
Pt's daughter would like pt's results of CT done on 05/12/14. Please advise. Thanks!

## 2014-05-24 NOTE — Telephone Encounter (Signed)
Pt's dtr calling back. Hasn't heard back from this yet. Her [phone] - works 11-4:30. Please call outside those hours.

## 2014-05-24 NOTE — Telephone Encounter (Signed)
RB please advise on CT results.  Thanks!  

## 2014-06-01 DIAGNOSIS — H60331 Swimmer's ear, right ear: Secondary | ICD-10-CM | POA: Diagnosis not present

## 2014-06-01 DIAGNOSIS — R070 Pain in throat: Secondary | ICD-10-CM | POA: Diagnosis not present

## 2014-06-02 NOTE — Telephone Encounter (Signed)
Per RB >> pulmonary nodules are stable and unchanged. No cancer is present. No further workup is needed.  Spoke with pt's daughter. Apologized for their wait on getting results. She is aware of pt's results. Nothing further was needed.

## 2014-06-08 ENCOUNTER — Other Ambulatory Visit: Payer: Self-pay | Admitting: Cardiovascular Disease

## 2014-06-10 ENCOUNTER — Ambulatory Visit: Payer: Self-pay | Admitting: Pharmacist Clinician (PhC)/ Clinical Pharmacy Specialist

## 2014-06-16 ENCOUNTER — Other Ambulatory Visit: Payer: Self-pay | Admitting: Adult Health

## 2014-06-16 DIAGNOSIS — I272 Other secondary pulmonary hypertension: Secondary | ICD-10-CM | POA: Diagnosis not present

## 2014-06-16 DIAGNOSIS — D692 Other nonthrombocytopenic purpura: Secondary | ICD-10-CM | POA: Diagnosis not present

## 2014-06-16 DIAGNOSIS — I130 Hypertensive heart and chronic kidney disease with heart failure and stage 1 through stage 4 chronic kidney disease, or unspecified chronic kidney disease: Secondary | ICD-10-CM | POA: Diagnosis not present

## 2014-06-16 DIAGNOSIS — R627 Adult failure to thrive: Secondary | ICD-10-CM | POA: Diagnosis not present

## 2014-06-16 DIAGNOSIS — F3341 Major depressive disorder, recurrent, in partial remission: Secondary | ICD-10-CM | POA: Diagnosis not present

## 2014-06-16 DIAGNOSIS — R2689 Other abnormalities of gait and mobility: Secondary | ICD-10-CM | POA: Diagnosis not present

## 2014-06-16 DIAGNOSIS — Z6824 Body mass index (BMI) 24.0-24.9, adult: Secondary | ICD-10-CM | POA: Diagnosis not present

## 2014-06-16 DIAGNOSIS — N183 Chronic kidney disease, stage 3 (moderate): Secondary | ICD-10-CM | POA: Diagnosis not present

## 2014-06-16 DIAGNOSIS — E1151 Type 2 diabetes mellitus with diabetic peripheral angiopathy without gangrene: Secondary | ICD-10-CM | POA: Diagnosis not present

## 2014-06-16 DIAGNOSIS — Z7901 Long term (current) use of anticoagulants: Secondary | ICD-10-CM | POA: Diagnosis not present

## 2014-06-16 DIAGNOSIS — I700 Atherosclerosis of aorta: Secondary | ICD-10-CM | POA: Diagnosis not present

## 2014-06-16 DIAGNOSIS — D649 Anemia, unspecified: Secondary | ICD-10-CM | POA: Diagnosis not present

## 2014-07-01 DIAGNOSIS — I10 Essential (primary) hypertension: Secondary | ICD-10-CM | POA: Diagnosis not present

## 2014-07-01 DIAGNOSIS — D696 Thrombocytopenia, unspecified: Secondary | ICD-10-CM | POA: Diagnosis not present

## 2014-07-01 DIAGNOSIS — D649 Anemia, unspecified: Secondary | ICD-10-CM | POA: Diagnosis not present

## 2014-07-18 DIAGNOSIS — I4891 Unspecified atrial fibrillation: Secondary | ICD-10-CM | POA: Diagnosis not present

## 2014-07-18 DIAGNOSIS — Z7901 Long term (current) use of anticoagulants: Secondary | ICD-10-CM | POA: Diagnosis not present

## 2014-08-08 ENCOUNTER — Telehealth (HOSPITAL_COMMUNITY): Payer: Self-pay | Admitting: *Deleted

## 2014-08-10 ENCOUNTER — Telehealth (HOSPITAL_COMMUNITY): Payer: Self-pay | Admitting: *Deleted

## 2014-08-22 ENCOUNTER — Other Ambulatory Visit: Payer: Self-pay

## 2014-08-24 DIAGNOSIS — Z7901 Long term (current) use of anticoagulants: Secondary | ICD-10-CM | POA: Diagnosis not present

## 2014-08-31 NOTE — Patient Outreach (Signed)
[facility]) Care Management  08/31/2014  Ronald [name] 03-May-1922 [id number]   Referral from [place] List, assigned Sherrin Daisy, RN.  [name]. [place], [place] Management [place] Assistant Phone: [phone] Fax: [phone]

## 2014-09-19 DIAGNOSIS — E1151 Type 2 diabetes mellitus with diabetic peripheral angiopathy without gangrene: Secondary | ICD-10-CM | POA: Diagnosis not present

## 2014-09-19 DIAGNOSIS — Z7901 Long term (current) use of anticoagulants: Secondary | ICD-10-CM | POA: Diagnosis not present

## 2014-09-19 DIAGNOSIS — J449 Chronic obstructive pulmonary disease, unspecified: Secondary | ICD-10-CM | POA: Diagnosis not present

## 2014-09-19 DIAGNOSIS — I509 Heart failure, unspecified: Secondary | ICD-10-CM | POA: Diagnosis not present

## 2014-09-19 DIAGNOSIS — I130 Hypertensive heart and chronic kidney disease with heart failure and stage 1 through stage 4 chronic kidney disease, or unspecified chronic kidney disease: Secondary | ICD-10-CM | POA: Diagnosis not present

## 2014-09-19 DIAGNOSIS — Z6823 Body mass index (BMI) 23.0-23.9, adult: Secondary | ICD-10-CM | POA: Diagnosis not present

## 2014-09-19 DIAGNOSIS — F5221 Male erectile disorder: Secondary | ICD-10-CM | POA: Diagnosis not present

## 2014-09-19 DIAGNOSIS — N183 Chronic kidney disease, stage 3 (moderate): Secondary | ICD-10-CM | POA: Diagnosis not present

## 2014-09-20 ENCOUNTER — Other Ambulatory Visit: Payer: Self-pay | Admitting: *Deleted

## 2014-09-20 NOTE — Patient Outreach (Signed)
[facility]) Care Management  09/20/2014  CAIDIN HEIDENREICH 1923/01/28 [id number]  High Risk Referral:  Telephone call to patient; no answer; unable to leave message. Plan: will follow up  Sherrin Daisy, RN BSN [place] Management Coordinator [facility] Care Management  [phone]

## 2014-09-26 ENCOUNTER — Other Ambulatory Visit: Payer: BLUE CROSS/BLUE SHIELD | Admitting: *Deleted

## 2014-09-26 NOTE — Patient Outreach (Signed)
[facility]) Care Management  09/26/2014  Ronald [name] October 18, 1922 [id number]  Telephone call to patient; left message on voice mail of cell phone requesting return call.   Plan: Will follow up.   Sherrin Daisy, RN BSN [place] Management Coordinator [facility] Care Management  [phone]

## 2014-10-03 ENCOUNTER — Encounter (HOSPITAL_COMMUNITY): Payer: Self-pay

## 2014-10-03 ENCOUNTER — Emergency Department (HOSPITAL_COMMUNITY): Payer: Medicare Other

## 2014-10-03 ENCOUNTER — Emergency Department (HOSPITAL_COMMUNITY)
Admission: EM | Admit: 2014-10-03 | Discharge: 2014-10-04 | Disposition: A | Discharge status: Home / Self Care | Payer: Medicare Other | Attending: Emergency Medicine | Admitting: Emergency Medicine

## 2014-10-03 DIAGNOSIS — Z8701 Personal history of pneumonia (recurrent): Secondary | ICD-10-CM | POA: Diagnosis not present

## 2014-10-03 DIAGNOSIS — I5032 Chronic diastolic (congestive) heart failure: Secondary | ICD-10-CM | POA: Insufficient documentation

## 2014-10-03 DIAGNOSIS — Z872 Personal history of diseases of the skin and subcutaneous tissue: Secondary | ICD-10-CM | POA: Diagnosis not present

## 2014-10-03 DIAGNOSIS — Z9889 Other specified postprocedural states: Secondary | ICD-10-CM | POA: Diagnosis not present

## 2014-10-03 DIAGNOSIS — Z794 Long term (current) use of insulin: Secondary | ICD-10-CM | POA: Diagnosis not present

## 2014-10-03 DIAGNOSIS — I10 Essential (primary) hypertension: Secondary | ICD-10-CM | POA: Diagnosis not present

## 2014-10-03 DIAGNOSIS — Z791 Long term (current) use of non-steroidal anti-inflammatories (NSAID): Secondary | ICD-10-CM | POA: Insufficient documentation

## 2014-10-03 DIAGNOSIS — W01198A Fall on same level from slipping, tripping and stumbling with subsequent striking against other object, initial encounter: Secondary | ICD-10-CM | POA: Insufficient documentation

## 2014-10-03 DIAGNOSIS — M542 Cervicalgia: Secondary | ICD-10-CM | POA: Diagnosis not present

## 2014-10-03 DIAGNOSIS — E119 Type 2 diabetes mellitus without complications: Secondary | ICD-10-CM | POA: Diagnosis not present

## 2014-10-03 DIAGNOSIS — Z792 Long term (current) use of antibiotics: Secondary | ICD-10-CM | POA: Diagnosis not present

## 2014-10-03 DIAGNOSIS — Z862 Personal history of diseases of the blood and blood-forming organs and certain disorders involving the immune mechanism: Secondary | ICD-10-CM | POA: Diagnosis not present

## 2014-10-03 DIAGNOSIS — Z7951 Long term (current) use of inhaled steroids: Secondary | ICD-10-CM | POA: Insufficient documentation

## 2014-10-03 DIAGNOSIS — S52501A Unspecified fracture of the lower end of right radius, initial encounter for closed fracture: Secondary | ICD-10-CM

## 2014-10-03 DIAGNOSIS — Y998 Other external cause status: Secondary | ICD-10-CM | POA: Insufficient documentation

## 2014-10-03 DIAGNOSIS — Z9861 Coronary angioplasty status: Secondary | ICD-10-CM | POA: Diagnosis not present

## 2014-10-03 DIAGNOSIS — I481 Persistent atrial fibrillation: Secondary | ICD-10-CM | POA: Insufficient documentation

## 2014-10-03 DIAGNOSIS — J449 Chronic obstructive pulmonary disease, unspecified: Secondary | ICD-10-CM | POA: Insufficient documentation

## 2014-10-03 DIAGNOSIS — F329 Major depressive disorder, single episode, unspecified: Secondary | ICD-10-CM | POA: Insufficient documentation

## 2014-10-03 DIAGNOSIS — Y9389 Activity, other specified: Secondary | ICD-10-CM | POA: Diagnosis not present

## 2014-10-03 DIAGNOSIS — Z720 Tobacco use: Secondary | ICD-10-CM | POA: Diagnosis not present

## 2014-10-03 DIAGNOSIS — I4891 Unspecified atrial fibrillation: Secondary | ICD-10-CM | POA: Diagnosis not present

## 2014-10-03 DIAGNOSIS — R51 Headache: Secondary | ICD-10-CM | POA: Diagnosis not present

## 2014-10-03 DIAGNOSIS — Z79899 Other long term (current) drug therapy: Secondary | ICD-10-CM | POA: Insufficient documentation

## 2014-10-03 DIAGNOSIS — Z8719 Personal history of other diseases of the digestive system: Secondary | ICD-10-CM | POA: Insufficient documentation

## 2014-10-03 DIAGNOSIS — S52591A Other fractures of lower end of right radius, initial encounter for closed fracture: Secondary | ICD-10-CM | POA: Diagnosis not present

## 2014-10-03 DIAGNOSIS — S199XXA Unspecified injury of neck, initial encounter: Secondary | ICD-10-CM | POA: Diagnosis not present

## 2014-10-03 DIAGNOSIS — Z8551 Personal history of malignant neoplasm of bladder: Secondary | ICD-10-CM | POA: Insufficient documentation

## 2014-10-03 DIAGNOSIS — S0003XA Contusion of scalp, initial encounter: Secondary | ICD-10-CM | POA: Insufficient documentation

## 2014-10-03 DIAGNOSIS — Z7901 Long term (current) use of anticoagulants: Secondary | ICD-10-CM | POA: Diagnosis not present

## 2014-10-03 DIAGNOSIS — Y92000 Kitchen of unspecified non-institutional (private) residence as  the place of occurrence of the external cause: Secondary | ICD-10-CM | POA: Insufficient documentation

## 2014-10-03 DIAGNOSIS — S0990XA Unspecified injury of head, initial encounter: Secondary | ICD-10-CM | POA: Diagnosis not present

## 2014-10-03 DIAGNOSIS — S6991XA Unspecified injury of right wrist, hand and finger(s), initial encounter: Secondary | ICD-10-CM | POA: Diagnosis present

## 2014-10-03 NOTE — ED Notes (Signed)
Pt has a small skin tear on his right elbow and a small bump on his head from the fall

## 2014-10-03 NOTE — ED Notes (Signed)
Patient transported to X-ray 

## 2014-10-03 NOTE — ED Provider Notes (Signed)
CSN: [id number]     Arrival date & time 10/03/14  2141 History  This chart was scribed for Dalia Heading, PA-C, working with Serita Grit, MD by Steva Colder, ED Scribe. The patient was seen in room WTR8/WTR8 at 10:44 PM.    Chief Complaint  Patient presents with  . Wrist Injury      The history is provided by the patient. No language interpreter was used.    HPI Comments: Ronald [name] is a 81 y.o. male who presents to the Emergency Department complaining of right wrist injury onset today PTA. He reports that he fell in his kitchen today and hit his head on the countertop. Pt notes that he has an orthopedist that he had complete his hip surgery in the past. He states that he is having associated symptoms of hit his head and knot to the top of his head. He states that he has tried ice with no relief for his symptoms. He denies color change, rash, and any other symptoms. Pt is currently taking coumadin.  Past Medical History  Diagnosis Date  . AS (aortic stenosis)     bovine aortic valve replacement 01/07/11  . HTN (hypertension)   . Thrombocytopenia due to drugs     seen by Dr Inda Merlin plts [id number] no rx  . Peripheral vascular disease very poor circulation legs and feet ... stents right and left legs... done in dr j. Gwenlyn Found 's office.   . Depression wife died 4 years ago.    Marland Kitchen COPD (chronic obstructive pulmonary disease)   . Shortness of breath   . Recurrent upper respiratory infection (URI)     sinusitis  . Claudication in peripheral vascular disease 06/17/2011  . S/P angioplasty with stent, diamond back rotational athrectomy Prox. Rt. SFA 06/17/2011 06/17/2011  . Neuropathy due to secondary diabetes   . Type II diabetes mellitus   . Pneumonia 07/25/11    left  . Persistent atrial fibrillation   . Blood transfusion     w/hip operation  . History of stomach ulcers ~ 1951  . Renal artery stenosis 2006    renal artery stent  . Bladder cancer     Bladder Cancer local  .  Cellulitis of left lower extremity   . Chronic diastolic congestive heart failure   . Normal coronary arteries Sept 2012   Past Surgical History  Procedure Laterality Date  . Peripheral arterial stent graft      2006 left anf right illiac stents Dr Deon Pilling  . Aortic valve replacement  01/07/2011    Procedure: AORTIC VALVE REPLACEMENT (AVR);  Surgeon: Grace Isaac, MD;  Location: [place];  Service: Open Heart Surgery;  Laterality: N/A;; magna-ease bovine 46mm bioprosthesis  . Cardioversion  04/03/2011    Procedure: CARDIOVERSION;  Surgeon: Leonie Man, MD;  Location: [place];  Service: Cardiovascular;  Laterality: N/A;  . Lower extremity angiogram  06/17/2011    diamondback orbital rotational and cutting balloon atherectomy of the prox R SFA  . Femur im nail  06/27/2011    Procedure: INTRAMEDULLARY (IM) NAIL FEMORAL;  Surgeon: Marin Shutter, MD;  Location: [facility];  Service: Orthopedics;  Laterality: Right;  . Tonsillectomy and adenoidectomy      "when I was a kid"  . Cholecystectomy    . Cataract extraction w/ intraocular lens  implant, bilateral  ~ 2007  . Renal artery stent  2006    "I believe"  . Cardiac catheterization  11/19/10  normal coronaries, mod AS, 75% l RAS  . Femur im nail Left 12/14/2013    Procedure: INTRAMEDULLARY (IM) NAIL FEMORAL;  Surgeon: Mauri Pole, MD;  Location: [facility];  Service: Orthopedics;  Laterality: Left;  . Atherectomy N/A 06/17/2011    Procedure: ATHERECTOMY;  Surgeon: Lorretta Harp, MD;  Location: [facility] CATH [facility];  Service: Cardiovascular;  Laterality: N/A;  . Lower extremity angiogram Bilateral 06/17/2011    Procedure: LOWER EXTREMITY ANGIOGRAM;  Surgeon: Lorretta Harp, MD;  Location: [facility] CATH LAB;  Service: Cardiovascular;  Laterality: Bilateral;  . Abdominal angiogram  06/17/2011    Procedure: ABDOMINAL ANGIOGRAM;  Surgeon: Lorretta Harp, MD;  Location: [facility] CATH LAB;  Service: Cardiovascular;;   Family History  Problem Relation Age of Onset   . Heart disease Mother   . Cancer Brother     colon  . Stroke Father    History  Substance Use Topics  . Smoking status: Current Every Day Smoker -- 2.00 packs/day for 75 years    Types: Cigarettes  . Smokeless tobacco: Never Used  . Alcohol Use: 1.2 oz/week    2 Cans of beer per week    Review of Systems  Musculoskeletal: Positive for arthralgias. Negative for joint swelling.  Skin: Positive for wound. Negative for color change and rash.      Allergies  Aspirin  Home Medications   Prior to Admission medications   Medication Sig Start Date End Date Taking? Authorizing Provider  alendronate (FOSAMAX) 70 MG tablet Take 70 mg by mouth every 14 (fourteen) days. Take with a full glass of water on an empty stomach. Sunday   Yes Historical Provider, MD  benazepril (LOTENSIN) 10 MG tablet Take 10 mg by mouth daily.   Yes Historical Provider, MD  buPROPion (WELLBUTRIN XL) 150 MG 24 hr tablet Take 150 mg by mouth every morning. 09/29/14  Yes Historical Provider, MD  doxazosin (CARDURA) 4 MG tablet Take 4 mg by mouth every evening.    Yes Coolidge Breeze, PA-C  finasteride (PROSCAR) 5 MG tablet Take 5 mg by mouth daily.    Yes Historical Provider, MD  fluticasone (FLONASE) 50 MCG/ACT nasal spray Place 1 spray into both nostrils daily.   Yes Historical Provider, MD  furosemide (LASIX) 40 MG tablet Take 1 tablet (40 mg total) by mouth 2 (two) times daily. 07/08/12  Yes Brett Canales, PA-C  glyBURIDE (DIABETA) 5 MG tablet Take 2.5-10 mg by mouth 2 (two) times daily with a meal. 2 tablets in the morning and 1/2 tablet in the evening   Yes Historical Provider, MD  insulin glargine (LANTUS) 100 UNIT/ML injection Inject 0.26 mLs (26 Units total) into the skin every morning. Patient taking differently: Inject 24 Units into the skin every morning.  12/16/13  Yes Barton Dubois, MD  loratadine (CLARITIN) 10 MG tablet Take 10 mg by mouth daily.   Yes Historical Provider, MD  metolazone (ZAROXOLYN) 2.5  MG tablet Take 2.5 mg by mouth daily as needed. For weight gain on 2.5 Ibs in one night. 08/16/14  Yes Historical Provider, MD  metoprolol succinate (TOPROL-XL) 50 MG 24 hr tablet Take 50 mg by mouth 2 (two) times daily. Take with or immediately following a meal.   Yes Historical Provider, MD  mupirocin ointment (BACTROBAN) 2 % Apply 1 application topically 2 (two) times daily. 10 day therapy course patient began on 09/30/14. 09/30/14  Yes Historical Provider, MD  potassium chloride SA (K-DUR,KLOR-CON) 20 MEQ tablet Take 20  mEq by mouth daily.   Yes Historical Provider, MD  simvastatin (ZOCOR) 20 MG tablet Take 20 mg by mouth every evening.   Yes Historical Provider, MD  traMADol (ULTRAM) 50 MG tablet Take 1 tablet (50 mg total) by mouth 2 (two) times daily. 12/16/13  Yes Barton Dubois, MD  Umeclidinium-Vilanterol [facility] ELLIPTA) 62.5-25 MCG/INH AEPB Inhale 1 puff into the lungs daily.   Yes Historical Provider, MD  warfarin (COUMADIN) 5 MG tablet Take 2.5 mg by mouth every evening.    Yes Historical Provider, MD   BP 127/55 mmHg  Pulse 67  Temp(Src) 97.9 F (36.6 C)  Resp 20  SpO2 98% Physical Exam  Constitutional: He is oriented to person, place, and time. He appears well-developed and well-nourished. No distress.  HENT:  Head: Normocephalic and atraumatic.  Slight hematoma to the right lateral scalp.  Eyes: Pupils are equal, round, and reactive to light.  Neck: Normal range of motion. Neck supple. No tracheal deviation present.  Cardiovascular: Normal rate, regular rhythm and normal heart sounds.  Exam reveals no gallop and no friction rub.   No murmur heard. Good pulses and good cap refill in right fingers.   Pulmonary/Chest: Effort normal and breath sounds normal. No respiratory distress.  Musculoskeletal: Normal range of motion.  Swelling and deformity of the right wrist. Normal cap refill and sensation  Neurological: He is alert and oriented to person, place, and time.  Skin: Skin is  warm and dry.  Psychiatric: He has a normal mood and affect. His behavior is normal.  Nursing note and vitals reviewed.   ED Course  Procedures (including critical care time) DIAGNOSTIC STUDIES: Oxygen Saturation is 98% on RA, nl by my interpretation.    COORDINATION OF CARE: 10:47 PM Discussed treatment plan with pt at bedside and pt agreed to plan.   Imaging Review Dg Wrist Complete Right  10/03/2014   CLINICAL DATA:  Pain following fall  EXAM: RIGHT WRIST - COMPLETE 3+ VIEW  COMPARISON:  June 12, 2013  FINDINGS: Frontal, oblique, lateral, and ulnar deviation scaphoid images were obtained. There is a comminuted fracture of the distal radial metaphysis with dorsal angulation distally. Fracture fragments extend into the radiocarpal joint. No other fractures are identified. No dislocation. There is mild osteoarthritic change at the first carpal -metacarpal joint.  IMPRESSION: Comminuted fracture distal radius with fragments extending into the radiocarpal joint. There appears to be re-injury in this area compared to prior study. No other fractures. No dislocation.   Electronically Signed   By: Lowella Grip III M.D.   On: 10/03/2014 23:27   Ct Head Wo Contrast  10/03/2014   CLINICAL DATA:  Pain following fall  EXAM: CT HEAD WITHOUT CONTRAST  CT CERVICAL SPINE WITHOUT CONTRAST  TECHNIQUE: Multidetector CT imaging of the head and cervical spine was performed following the standard protocol without intravenous contrast. Multiplanar CT image reconstructions of the cervical spine were also generated.  COMPARISON:  Head CT April 11, 2013  FINDINGS: CT HEAD FINDINGS  There is mild diffuse atrophy. There is no intracranial mass hemorrhage, extra-axial fluid collection, or midline shift. There is patchy small vessel disease in the centra semiovale bilaterally. No new gray-white compartment lesion identified. No acute infarct evident. The bony calvarium appears intact. The mastoid air cells are clear.  There is a retention cyst in the right sphenoid sinus. There is opacification in several ethmoid air cells bilaterally. There are retention cysts in the superior right maxillary antrum.  CT CERVICAL SPINE  FINDINGS  There is no fracture or spondylolisthesis. Prevertebral soft tissues and predental space regions are normal. There is moderate disc space narrowing at C4-5. There is mild disc space narrowing at C5-6. There is facet hypertrophy at most levels bilaterally. There is exit foraminal narrowing at C5-6 on the right due to bony hypertrophy. No disc extrusion or stenosis. There are foci of carotid artery calcification bilaterally. There is diffuse inhomogeneity in the thyroid with an apparent dominant mass in the right lobe measuring 1.4 x 1.4 cm.  IMPRESSION: CT head: Mild diffuse atrophy with patchy periventricular small vessel disease. No intracranial mass, hemorrhage, or extra-axial collection. No acute infarct evident. Multiple areas of paranasal sinus disease.  CT cervical spine: Multilevel osteoarthritic change. No fracture or spondylolisthesis.  Areas of carotid artery calcification bilaterally.  Right lobe thyroid mass. Consider further evaluation with thyroid ultrasound. If patient is clinically hyperthyroid, consider nuclear medicine thyroid uptake and scan.   Electronically Signed   By: Lowella Grip III M.D.   On: 10/03/2014 23:22   Ct Cervical Spine Wo Contrast  10/03/2014   CLINICAL DATA:  Pain following fall  EXAM: CT HEAD WITHOUT CONTRAST  CT CERVICAL SPINE WITHOUT CONTRAST  TECHNIQUE: Multidetector CT imaging of the head and cervical spine was performed following the standard protocol without intravenous contrast. Multiplanar CT image reconstructions of the cervical spine were also generated.  COMPARISON:  Head CT April 11, 2013  FINDINGS: CT HEAD FINDINGS  There is mild diffuse atrophy. There is no intracranial mass hemorrhage, extra-axial fluid collection, or midline shift. There is  patchy small vessel disease in the centra semiovale bilaterally. No new gray-white compartment lesion identified. No acute infarct evident. The bony calvarium appears intact. The mastoid air cells are clear. There is a retention cyst in the right sphenoid sinus. There is opacification in several ethmoid air cells bilaterally. There are retention cysts in the superior right maxillary antrum.  CT CERVICAL SPINE FINDINGS  There is no fracture or spondylolisthesis. Prevertebral soft tissues and predental space regions are normal. There is moderate disc space narrowing at C4-5. There is mild disc space narrowing at C5-6. There is facet hypertrophy at most levels bilaterally. There is exit foraminal narrowing at C5-6 on the right due to bony hypertrophy. No disc extrusion or stenosis. There are foci of carotid artery calcification bilaterally. There is diffuse inhomogeneity in the thyroid with an apparent dominant mass in the right lobe measuring 1.4 x 1.4 cm.  IMPRESSION: CT head: Mild diffuse atrophy with patchy periventricular small vessel disease. No intracranial mass, hemorrhage, or extra-axial collection. No acute infarct evident. Multiple areas of paranasal sinus disease.  CT cervical spine: Multilevel osteoarthritic change. No fracture or spondylolisthesis.  Areas of carotid artery calcification bilaterally.  Right lobe thyroid mass. Consider further evaluation with thyroid ultrasound. If patient is clinically hyperthyroid, consider nuclear medicine thyroid uptake and scan.   Electronically Signed   By: Lowella Grip III M.D.   On: 10/03/2014 23:22   I personally performed the services described in this documentation, which was scribed in my presence. The recorded information has been reviewed and is accurate.   The patient will be referred back to his orthopedic surgeon agrees per orthopedics  Told to return here as needed.  The rest of his imaging was negative  Dalia Heading, PA-C 10/04/14  0012  Serita Grit, MD 10/06/14 [phone]

## 2014-10-03 NOTE — ED Notes (Signed)
Pt fell and tried to catch himself with his right hand, son states that his wrist is swollen and deformed

## 2014-10-04 MED ORDER — HYDROCODONE-ACETAMINOPHEN 5-325 MG PO TABS: 1.0000 | ORAL_TABLET | Freq: Four times a day (QID) | ORAL | Status: DC | PRN

## 2014-10-04 NOTE — Discharge Instructions (Signed)
Return here as needed. Follow up with your orthopedist. °

## 2014-10-06 DIAGNOSIS — S52571A Other intraarticular fracture of lower end of right radius, initial encounter for closed fracture: Secondary | ICD-10-CM | POA: Diagnosis not present

## 2014-10-13 DIAGNOSIS — S52571D Other intraarticular fracture of lower end of right radius, subsequent encounter for closed fracture with routine healing: Secondary | ICD-10-CM | POA: Diagnosis not present

## 2014-10-24 DIAGNOSIS — L570 Actinic keratosis: Secondary | ICD-10-CM | POA: Diagnosis not present

## 2014-10-24 DIAGNOSIS — L820 Inflamed seborrheic keratosis: Secondary | ICD-10-CM | POA: Diagnosis not present

## 2014-11-01 ENCOUNTER — Encounter (HOSPITAL_COMMUNITY): Payer: Self-pay | Admitting: Emergency Medicine

## 2014-11-01 ENCOUNTER — Emergency Department (HOSPITAL_COMMUNITY): Payer: Medicare Other

## 2014-11-01 ENCOUNTER — Emergency Department (HOSPITAL_COMMUNITY)
Admission: EM | Admit: 2014-11-01 | Discharge: 2014-11-01 | Disposition: A | Discharge status: Home / Self Care | Payer: Medicare Other | Attending: Emergency Medicine | Admitting: Emergency Medicine

## 2014-11-01 DIAGNOSIS — Z720 Tobacco use: Secondary | ICD-10-CM | POA: Diagnosis not present

## 2014-11-01 DIAGNOSIS — Z862 Personal history of diseases of the blood and blood-forming organs and certain disorders involving the immune mechanism: Secondary | ICD-10-CM | POA: Insufficient documentation

## 2014-11-01 DIAGNOSIS — Z7951 Long term (current) use of inhaled steroids: Secondary | ICD-10-CM | POA: Diagnosis not present

## 2014-11-01 DIAGNOSIS — I10 Essential (primary) hypertension: Secondary | ICD-10-CM | POA: Insufficient documentation

## 2014-11-01 DIAGNOSIS — I509 Heart failure, unspecified: Secondary | ICD-10-CM

## 2014-11-01 DIAGNOSIS — Z79899 Other long term (current) drug therapy: Secondary | ICD-10-CM | POA: Insufficient documentation

## 2014-11-01 DIAGNOSIS — R635 Abnormal weight gain: Secondary | ICD-10-CM | POA: Diagnosis not present

## 2014-11-01 DIAGNOSIS — Z794 Long term (current) use of insulin: Secondary | ICD-10-CM | POA: Insufficient documentation

## 2014-11-01 LAB — CBC
HCT: 34.9 % — ABNORMAL LOW (ref 39.0–52.0)
Hemoglobin: 11.5 g/dL — ABNORMAL LOW (ref 13.0–17.0)
MCH: 31 pg (ref 26.0–34.0)
MCHC: 33 g/dL (ref 30.0–36.0)
MCV: 94.1 fL (ref 78.0–100.0)
PLATELETS: 95 10*3/uL — AB (ref 150–400)
RBC: 3.71 MIL/uL — AB (ref 4.22–5.81)
RDW: 15.5 % (ref 11.5–15.5)
WBC: 7.3 10*3/uL (ref 4.0–10.5)

## 2014-11-01 LAB — BASIC METABOLIC PANEL
Anion gap: 9 (ref 5–15)
BUN: 28 mg/dL — ABNORMAL HIGH (ref 6–20)
CALCIUM: 9.1 mg/dL (ref 8.9–10.3)
CO2: 25 mmol/L (ref 22–32)
CREATININE: 1.56 mg/dL — AB (ref 0.61–1.24)
Chloride: 105 mmol/L (ref 101–111)
GFR, EST AFRICAN AMERICAN: 43 mL/min — AB (ref >60)
GFR, EST NON AFRICAN AMERICAN: 37 mL/min — AB (ref >60)
Glucose, Bld: 171 mg/dL — ABNORMAL HIGH (ref 65–99)
Potassium: 4.1 mmol/L (ref 3.5–5.1)
SODIUM: 139 mmol/L (ref 135–145)

## 2014-11-01 LAB — I-STAT TROPONIN, ED: Troponin i, poc: 0.02 ng/mL (ref 0.00–0.08)

## 2014-11-01 LAB — BRAIN NATRIURETIC PEPTIDE: B Natriuretic Peptide: 239.7 pg/mL — ABNORMAL HIGH (ref 0.0–100.0)

## 2014-11-01 MED ORDER — METOLAZONE 2.5 MG PO TABS
2.5000 mg | ORAL_TABLET | Freq: Once | ORAL | Status: AC
  Administered 2014-11-01: 2.5 mg via ORAL
  Filled 2014-11-01: qty 1

## 2014-11-01 MED ORDER — FUROSEMIDE 10 MG/ML IJ SOLN
80.0000 mg | Freq: Once | INTRAMUSCULAR | Status: AC
  Administered 2014-11-01: 80 mg via INTRAVENOUS
  Filled 2014-11-01: qty 8

## 2014-11-01 NOTE — ED Provider Notes (Signed)
CSN: [id number]     Arrival date & time 11/01/14  1034 History   First MD Initiated Contact with Patient 11/01/14 1104     Chief Complaint  Patient presents with  . Weight Gain     (Consider location/radiation/quality/duration/timing/severity/associated sxs/prior Treatment) HPI Comments: Duke Weisensel is a 81 y.o M with a pmhx of a.fib, CHF, DM, AS s/p AVR 2012 who presents to the emergency department complaining of a 9 pound weight gain over the last week. Patient states that last years he was treated inpatient for heart failure. Patient has been managed with metolazone. Pt states that he keeps track of his weight each day and noticed a 9 pound weight gain in the last 7 days. Patient states that he has also been more short of breath than usual. Patient requires 2 pillows to problems with up at night while he sleeps. Denies chest pain, leg swelling, syncope, or blurry vision, weakness, numbness or tingling, headache. Patient states that he worries that this feels symptoms of his heart failure that he had last year.  The history is provided by the patient.    Past Medical History  Diagnosis Date  . AS (aortic stenosis)     bovine aortic valve replacement 01/07/11  . HTN (hypertension)   . Thrombocytopenia due to drugs     seen by Dr Inda Merlin plts 114000 no rx  . Peripheral vascular disease very poor circulation legs and feet ... stents right and left legs... done in dr j. Gwenlyn Found 's office.   . Depression wife died 4 years ago.    Marland Kitchen COPD (chronic obstructive pulmonary disease)   . Shortness of breath   . Recurrent upper respiratory infection (URI)     sinusitis  . Claudication in peripheral vascular disease 06/17/2011  . S/P angioplasty with stent, diamond back rotational athrectomy Prox. Rt. SFA 06/17/2011 06/17/2011  . Neuropathy due to secondary diabetes   . Type II diabetes mellitus   . Pneumonia 07/25/11    left  . Persistent atrial fibrillation   . Blood transfusion     w/hip  operation  . History of stomach ulcers ~ 1951  . Renal artery stenosis 2006    renal artery stent  . Bladder cancer     Bladder Cancer local  . Cellulitis of left lower extremity   . Chronic diastolic congestive heart failure   . Normal coronary arteries Sept 2012   Past Surgical History  Procedure Laterality Date  . Peripheral arterial stent graft      2006 left anf right illiac stents Dr Deon Pilling  . Aortic valve replacement  01/07/2011    Procedure: AORTIC VALVE REPLACEMENT (AVR);  Surgeon: Grace Isaac, MD;  Location: [facility];  Service: Open Heart Surgery;  Laterality: N/A;; magna-ease bovine 52mm bioprosthesis  . Cardioversion  04/03/2011    Procedure: CARDIOVERSION;  Surgeon: Leonie Man, MD;  Location: [facility];  Service: Cardiovascular;  Laterality: N/A;  . Lower extremity angiogram  06/17/2011    diamondback orbital rotational and cutting balloon atherectomy of the prox R SFA  . Femur im nail  06/27/2011    Procedure: INTRAMEDULLARY (IM) NAIL FEMORAL;  Surgeon: Marin Shutter, MD;  Location: [facility];  Service: Orthopedics;  Laterality: Right;  . Tonsillectomy and adenoidectomy      "when I was a kid"  . Cholecystectomy    . Cataract extraction w/ intraocular lens  implant, bilateral  ~ 2007  . Renal artery stent  2006    "  I believe"  . Cardiac catheterization  11/19/10    normal coronaries, mod AS, 75% l RAS  . Femur im nail Left 12/14/2013    Procedure: INTRAMEDULLARY (IM) NAIL FEMORAL;  Surgeon: Mauri Pole, MD;  Location: [facility];  Service: Orthopedics;  Laterality: Left;  . Atherectomy N/A 06/17/2011    Procedure: ATHERECTOMY;  Surgeon: Lorretta Harp, MD;  Location: [facility] C[facility] LAB;  Service: Cardiovascular;  Laterality: N/A;  . Lower extremity angiogram Bilateral 06/17/2011    Procedure: LOWER EXTREMITY ANGIOGRAM;  Surgeon: Lorretta Harp, MD;  Location: [facility] CATH LAB;  Service: Cardiovascular;  Laterality: Bilateral;  . Abdominal angiogram  06/17/2011    Procedure:  ABDOMINAL ANGIOGRAM;  Surgeon: Lorretta Harp, MD;  Location: [facility] CATH LAB;  Service: Cardiovascular;;   Family History  Problem Relation Age of Onset  . Heart disease Mother   . Cancer Brother     colon  . Stroke Father    Social History  Substance Use Topics  . Smoking status: Current Every Day Smoker -- 1.50 packs/day for 75 years    Types: Cigarettes  . Smokeless tobacco: Never Used  . Alcohol Use: No    Review of Systems  All other systems reviewed and are negative.     Allergies  Aspirin  Home Medications   Prior to Admission medications   Medication Sig Start Date End Date Taking? Authorizing Provider  alendronate (FOSAMAX) 70 MG tablet Take 70 mg by mouth every 14 (fourteen) days. Take with a full glass of water on an empty stomach. Sunday   Yes Historical Provider, MD  benazepril (LOTENSIN) 10 MG tablet Take 10 mg by mouth daily.   Yes Historical Provider, MD  buPROPion (WELLBUTRIN XL) 150 MG 24 hr tablet Take 150 mg by mouth every morning. 09/29/14  Yes Historical Provider, MD  doxazosin (CARDURA) 4 MG tablet Take 4 mg by mouth every evening.    Yes Coolidge Breeze, PA-C  finasteride (PROSCAR) 5 MG tablet Take 5 mg by mouth daily.    Yes Historical Provider, MD  fluticasone (FLONASE) 50 MCG/ACT nasal spray Place 1 spray into both nostrils daily.   Yes Historical Provider, MD  furosemide (LASIX) 40 MG tablet Take 1 tablet (40 mg total) by mouth 2 (two) times daily. 07/08/12  Yes Brett Canales, PA-C  glyBURIDE (DIABETA) 5 MG tablet Take 2.5-10 mg by mouth 2 (two) times daily with a meal. 2 tablets in the morning and 1/2 tablet in the evening   Yes Historical Provider, MD  insulin glargine (LANTUS) 100 UNIT/ML injection Inject 0.26 mLs (26 Units total) into the skin every morning. Patient taking differently: Inject 21 Units into the skin every morning.  12/16/13  Yes Barton Dubois, MD  loratadine (CLARITIN) 10 MG tablet Take 10 mg by mouth daily.   Yes Historical Provider,  MD  metolazone (ZAROXOLYN) 2.5 MG tablet Take 2.5-5 mg by mouth once a week. For weight >183 or very swollen 08/16/14  Yes Historical Provider, MD  metoprolol succinate (TOPROL-XL) 50 MG 24 hr tablet Take 50 mg by mouth 2 (two) times daily. Take with or immediately following a meal.   Yes Historical Provider, MD  potassium chloride SA (K-DUR,KLOR-CON) 20 MEQ tablet Take 20 mEq by mouth daily.   Yes Historical Provider, MD  simvastatin (ZOCOR) 20 MG tablet Take 20 mg by mouth every evening.   Yes Historical Provider, MD  Umeclidinium-Vilanterol (ANORO ELLIPTA) 62.5-25 MCG/INH AEPB Inhale 1 puff into the lungs  daily.   Yes Historical Provider, MD  warfarin (COUMADIN) 5 MG tablet Take 2.5 mg by mouth every evening.    Yes Historical Provider, MD  HYDROcodone-acetaminophen (NORCO/VICODIN) 5-325 MG per tablet Take 1 tablet by mouth every 6 (six) hours as needed for moderate pain. Patient not taking: Reported on 11/01/2014 10/04/14   Dalia Heading, PA-C  traMADol (ULTRAM) 50 MG tablet Take 1 tablet (50 mg total) by mouth 2 (two) times daily. Patient not taking: Reported on 11/01/2014 12/16/13   Barton Dubois, MD   BP 140/49 mmHg  Pulse 59  Resp 18  SpO2 100% Physical Exam  Constitutional: He is oriented to person, place, and time. He appears well-developed and well-nourished. No distress.  HENT:  Head: Normocephalic and atraumatic.  Mouth/Throat: Oropharynx is clear and moist. No oropharyngeal exudate.  Eyes: Conjunctivae and EOM are normal. Pupils are equal, round, and reactive to light. Right eye exhibits no discharge. Left eye exhibits no discharge. No scleral icterus.  Neck: No JVD present.  Cardiovascular: Normal rate, regular rhythm, normal heart sounds and intact distal pulses.  Exam reveals no gallop and no friction rub.   No murmur heard. Pulmonary/Chest: Effort normal and breath sounds normal. No respiratory distress. He has no wheezes. He has no rales. He exhibits no tenderness.   Abdominal: Soft. Bowel sounds are normal. He exhibits no distension and no mass. There is no tenderness. There is no rebound and no guarding.  No evidence of ascites.  Musculoskeletal: Normal range of motion. He exhibits edema ( 1+ pitting edema in bilateral LE ). He exhibits no tenderness.  Lymphadenopathy:    He has no cervical adenopathy.  Neurological: He is alert and oriented to person, place, and time.  Skin: Skin is warm and dry. No rash noted. He is not diaphoretic. No erythema. No pallor.  Circumferential Brawny skin changes in bilateral LE  Psychiatric: He has a normal mood and affect. His behavior is normal.  Nursing note and vitals reviewed.   ED Course  Procedures (including critical care time)  Consult to Cardiology Dr. Einar Gip placed.  Spoke with Dr. Einar Gip, pt not hit. Consult to on call cardiology placed   3:07 PM Spoke with on call cardiologist who recommend 80mg  IV lasix and 2.5 of metolazone. Will monitor pt. Does not recommend admission.   Labs Review Labs Reviewed  BASIC METABOLIC PANEL - Abnormal; Notable for the following:    Glucose, Bld 171 (*)    BUN 28 (*)    Creatinine, Ser 1.56 (*)    GFR calc non Af Amer 37 (*)    GFR calc Af Amer 43 (*)    All other components within normal limits  CBC - Abnormal; Notable for the following:    RBC 3.71 (*)    Hemoglobin 11.5 (*)    HCT 34.9 (*)    Platelets 95 (*)    All other components within normal limits  BRAIN NATRIURETIC PEPTIDE - Abnormal; Notable for the following:    B Natriuretic Peptide 239.7 (*)    All other components within normal limits  I-STAT TROPOININ, ED    Imaging Review Dg Chest 2 View  11/01/2014   CLINICAL DATA:  9 lb weight gain in 1 week  EXAM: CHEST  2 VIEW  COMPARISON:  December 11, 2013 chest radiograph and chest CT May 12, 2014  FINDINGS: There is no edema or consolidation. Heart is upper normal in size with pulmonary vascularity within normal limits. Patient is status post  aortic valve replacement. There is a left atrial appendage clamp present. No adenopathy. There is anterior wedging from name floor thoracic vertebral body, stable.  IMPRESSION: No edema or consolidation. No overt congestive heart failure radiographically.   Electronically Signed   By: Lowella Grip III M.D.   On: 11/01/2014 13:08   I have personally reviewed and evaluated these images and lab results as part of my medical decision-making.   EKG Interpretation None      MDM   Final diagnoses:  Heart failure, unspecified heart failure chronicity, unspecified heart failure type   BNP today 239.7. BNP 1 yr ago 1130. Heart failure exacerbation unlikely. Pt does not appear fluid overloaded on exam. Spoke with cardiology who recommend 80mg  IV lasix and 2.5 of metolazone. Pt tolerated well and was able to urinate. Recommend follow up with cardiology within week. Return precautions outlined in patient discharge instructions.   EKG unremarkable. Troponin negative. WBC wnl, afebrile. CXR negative. Infection unlikely.   Patient was discussed with and seen by Dr. Mingo Amber who agrees with the treatment plan.      Dondra Spry [place], PA-C 11/01/14 1607  Evelina Bucy, MD 11/01/14 [phone]

## 2014-11-01 NOTE — ED Notes (Signed)
Pt wheeled out to waiting room to wait for transportation home by his son. Pt is a/o x4. Ambulatory with steady gait on departure. Denies pain. VSS.

## 2014-11-01 NOTE — ED Notes (Signed)
Patient states he has had recent weight gain and is concerned about congestive heart failure.   Patient states he has gained 9 pounds in a week.   Patient denies chest pain or any other symptoms.   Patient states he went to his doctor this morning and was told to come to ED.

## 2014-11-01 NOTE — ED Notes (Signed)
Patient transported to X-ray 

## 2014-11-01 NOTE — Discharge Instructions (Signed)
Heart Failure °Heart failure is a condition in which the heart has trouble pumping blood. This means your heart does not pump blood efficiently for your body to work well. In some cases of heart failure, fluid may back up into your lungs or you may have swelling (edema) in your lower legs. Heart failure is usually a long-term (chronic) condition. It is important for you to take good care of yourself and follow your health care provider's treatment plan. °CAUSES  °Some health conditions can cause heart failure. Those health conditions include: °· High blood pressure (hypertension). Hypertension causes the heart muscle to work harder than normal. When pressure in the blood vessels is high, the heart needs to pump (contract) with more force in order to circulate blood throughout the body. High blood pressure eventually causes the heart to become stiff and weak. °· Coronary artery disease (CAD). CAD is the buildup of cholesterol and fat (plaque) in the arteries of the heart. The blockage in the arteries deprives the heart muscle of oxygen and blood. This can cause chest pain and may lead to a heart attack. High blood pressure can also contribute to CAD. °· Heart attack (myocardial infarction). A heart attack occurs when one or more arteries in the heart become blocked. The loss of oxygen damages the muscle tissue of the heart. When this happens, part of the heart muscle dies. The injured tissue does not contract as well and weakens the heart's ability to pump blood. °· Abnormal heart valves. When the heart valves do not open and close properly, it can cause heart failure. This makes the heart muscle pump harder to keep the blood flowing. °· Heart muscle disease (cardiomyopathy or myocarditis). Heart muscle disease is damage to the heart muscle from a variety of causes. These can include drug or alcohol abuse, infections, or unknown reasons. These can increase the risk of heart failure. °· Lung disease. Lung disease  makes the heart work harder because the lungs do not work properly. This can cause a strain on the heart, leading it to fail. °· Diabetes. Diabetes increases the risk of heart failure. High blood sugar contributes to high fat (lipid) levels in the blood. Diabetes can also cause slow damage to tiny blood vessels that carry important nutrients to the heart muscle. When the heart does not get enough oxygen and food, it can cause the heart to become weak and stiff. This leads to a heart that does not contract efficiently. °· Other conditions can contribute to heart failure. These include abnormal heart rhythms, thyroid problems, and low blood counts (anemia). °Certain unhealthy behaviors can increase the risk of heart failure, including: °· Being overweight. °· Smoking or chewing tobacco. °· Eating foods high in fat and cholesterol. °· Abusing illicit drugs or alcohol. °· Lacking physical activity. °SYMPTOMS  °Heart failure symptoms may vary and can be hard to detect. Symptoms may include: °· Shortness of breath with activity, such as climbing stairs. °· Persistent cough. °· Swelling of the feet, ankles, legs, or abdomen. °· Unexplained weight gain. °· Difficulty breathing when lying flat (orthopnea). °· Waking from sleep because of the need to sit up and get more air. °· Rapid heartbeat. °· Fatigue and loss of energy. °· Feeling light-headed, dizzy, or close to fainting. °· Loss of appetite. °· Nausea. °· Increased urination during the night (nocturia). °DIAGNOSIS  °A diagnosis of heart failure is based on your history, symptoms, physical examination, and diagnostic tests. Diagnostic tests for heart failure may include: °·   Echocardiography. °· Electrocardiography. °· Chest X-ray. °· Blood tests. °· Exercise stress test. °· Cardiac angiography. °· Radionuclide scans. °TREATMENT  °Treatment is aimed at managing the symptoms of heart failure. Medicines, behavioral changes, or surgical intervention may be necessary to  treat heart failure. °· Medicines to help treat heart failure may include: °¨ Angiotensin-converting enzyme (ACE) inhibitors. This type of medicine blocks the effects of a blood protein called angiotensin-converting enzyme. ACE inhibitors relax (dilate) the blood vessels and help lower blood pressure. °¨ Angiotensin receptor blockers (ARBs). This type of medicine blocks the actions of a blood protein called angiotensin. Angiotensin receptor blockers dilate the blood vessels and help lower blood pressure. °¨ Water pills (diuretics). Diuretics cause the kidneys to remove salt and water from the blood. The extra fluid is removed through urination. This loss of extra fluid lowers the volume of blood the heart pumps. °¨ Beta blockers. These prevent the heart from beating too fast and improve heart muscle strength. °¨ Digitalis. This increases the force of the heartbeat. °· Healthy behavior changes include: °¨ Obtaining and maintaining a healthy weight. °¨ Stopping smoking or chewing tobacco. °¨ Eating heart-healthy foods. °¨ Limiting or avoiding alcohol. °¨ Stopping illicit drug use. °¨ Physical activity as directed by your health care provider. °· Surgical treatment for heart failure may include: °¨ A procedure to open blocked arteries, repair damaged heart valves, or remove damaged heart muscle tissue. °¨ A pacemaker to improve heart muscle function and control certain abnormal heart rhythms. °¨ An internal cardioverter defibrillator to treat certain serious abnormal heart rhythms. °¨ A left ventricular assist device (LVAD) to assist the pumping ability of the heart. °HOME CARE INSTRUCTIONS  °· Take medicines only as directed by your health care provider. Medicines are important in reducing the workload of your heart, slowing the progression of heart failure, and improving your symptoms. °¨ Do not stop taking your medicine unless directed by your health care provider. °¨ Do not skip any dose of medicine. °¨ Refill your  prescriptions before you run out of medicine. Your medicines are needed every day. °· Engage in moderate physical activity if directed by your health care provider. Moderate physical activity can benefit some people. The elderly and people with severe heart failure should consult with a health care provider for physical activity recommendations. °· Eat heart-healthy foods. Food choices should be free of trans fat and low in saturated fat, cholesterol, and salt (sodium). Healthy choices include fresh or frozen fruits and vegetables, fish, lean meats, legumes, fat-free or low-fat dairy products, and whole grain or high fiber foods. Talk to a dietitian to learn more about heart-healthy foods. °· Limit sodium if directed by your health care provider. Sodium restriction may reduce symptoms of heart failure in some people. Talk to a dietitian to learn more about heart-healthy seasonings. °· Use healthy cooking methods. Healthy cooking methods include roasting, grilling, broiling, baking, poaching, steaming, or stir-frying. Talk to a dietitian to learn more about healthy cooking methods. °· Limit fluids if directed by your health care provider. Fluid restriction may reduce symptoms of heart failure in some people. °· Weigh yourself every day. Daily weights are important in the early recognition of excess fluid. You should weigh yourself every morning after you urinate and before you eat breakfast. Wear the same amount of clothing each time you weigh yourself. Record your daily weight. Provide your health care provider with your weight record. °· Monitor and record your blood pressure if directed by your health care   provider.  Check your pulse if directed by your health care provider.  Lose weight if directed by your health care provider. Weight loss may reduce symptoms of heart failure in some people.  Stop smoking or chewing tobacco. Nicotine makes your heart work harder by causing your blood vessels to constrict.  Do not use nicotine gum or patches before talking to your health care provider.  Keep all follow-up visits as directed by your health care provider. This is important.  Limit alcohol intake to no more than 1 drink per day for nonpregnant women and 2 drinks per day for men. One drink equals 12 ounces of beer, 5 ounces of wine, or 1 ounces of hard liquor. Drinking more than that is harmful to your heart. Tell your health care provider if you drink alcohol several times a week. Talk with your health care provider about whether alcohol is safe for you. If your heart has already been damaged by alcohol or you have severe heart failure, drinking alcohol should be stopped completely.  Stop illicit drug use.  Stay up-to-date with immunizations. It is especially important to prevent respiratory infections through current pneumococcal and influenza immunizations.  Manage other health conditions such as hypertension, diabetes, thyroid disease, or abnormal heart rhythms as directed by your health care provider.  Learn to manage stress.  Plan rest periods when fatigued.  Learn strategies to manage high temperatures. If the weather is extremely hot:  Avoid vigorous physical activity.  Use air conditioning or fans or seek a cooler location.  Avoid caffeine and alcohol.  Wear loose-fitting, lightweight, and light-colored clothing.  Learn strategies to manage cold temperatures. If the weather is extremely cold:  Avoid vigorous physical activity.  Layer clothes.  Wear mittens or gloves, a hat, and a scarf when going outside.  Avoid alcohol.  Obtain ongoing education and support as needed.  Participate in or seek rehabilitation as needed to maintain or improve independence and quality of life. SEEK MEDICAL CARE IF:   Your weight increases by 03 lb/1.4 kg in 1 day or 05 lb/2.3 kg in a week.  You have increasing shortness of breath that is unusual for you.  You are unable to participate in  your usual physical activities.  You tire easily.  You cough more than normal, especially with physical activity.  You have any or more swelling in areas such as your hands, feet, ankles, or abdomen.  You are unable to sleep because it is hard to breathe.  You feel like your heart is beating fast (palpitations).  You become dizzy or light-headed upon standing up. SEEK IMMEDIATE MEDICAL CARE IF:   You have difficulty breathing.  There is a change in mental status such as decreased alertness or difficulty with concentration.  You have a pain or discomfort in your chest.  You have an episode of fainting (syncope). MAKE SURE YOU:   Understand these instructions.  Will watch your condition.  Will get help right away if you are not doing well or get worse. Document Released: 02/11/2005 Document Revised: 06/28/2013 Document Reviewed: 03/13/2012 [facility] Patient Information 2015 [place], [place]. This information is not intended to replace advice given to you by your health care provider. Make sure you discuss any questions you have with your health care provider.  Return if shortness of breath, lower leg swelling, fevers, chills, or chest pain occur. Follow up with your cardiologist this week for further evaluation. Continue to monitor weight changes. Take medication as prescribed.

## 2014-11-09 ENCOUNTER — Other Ambulatory Visit: Payer: Self-pay | Admitting: *Deleted

## 2014-11-09 NOTE — Patient Outreach (Signed)
[facility] [facility]) Care Management  11/09/2014  JMICHAEL GILLE 1922/10/02 [id number]   Telephone call to patient who was advised of reason for call and of [facility] care management services. Patient voices that he is unable to talk now but requested a call tomorrow.  Plan: Will call back tomorrow. Patient in agreement with appointment set.  Sherrin Daisy, RN BSN [place] Management Coordinator [facility] Care Management  [phone]

## 2014-11-10 ENCOUNTER — Other Ambulatory Visit: Payer: Self-pay | Admitting: *Deleted

## 2014-11-10 DIAGNOSIS — IMO0002 Reserved for concepts with insufficient information to code with codable children: Secondary | ICD-10-CM

## 2014-11-10 DIAGNOSIS — I5032 Chronic diastolic (congestive) heart failure: Secondary | ICD-10-CM

## 2014-11-10 DIAGNOSIS — E1165 Type 2 diabetes mellitus with hyperglycemia: Secondary | ICD-10-CM

## 2014-11-10 DIAGNOSIS — E118 Type 2 diabetes mellitus with unspecified complications: Secondary | ICD-10-CM

## 2014-11-10 NOTE — Patient Outreach (Signed)
[facility]) Care Management  11/10/2014  Ronald [name] August 05, 1922 [id number]   Request from Sherrin Daisy, RN to assign Community RN, assigned Ronald Silversmith, RN.  Thanks, [name]. [place], [place] Assistant Phone: [phone] Fax: [phone]

## 2014-11-10 NOTE — Patient Outreach (Signed)
[place] [facility]) Care Management  11/10/2014  Ophir [birth year] [id number]   Telephone call to patient who gave HIPPA verification. Patient was advised of reason for call & [facility] care management services.   Subjective: Patient states major concern was build up of fluid. States he recently had to go to emergency room because of fluid build up and received  treatment and was discharged. States he has been advised by doctor to take extra fluid pill if he gains several pounds in a day. States he is weighing daily. Patient reports that he has diabetes and has neuropathy in feet; uses cane and scooter as needed.  Reports that he has fallen this year and sustained broken wrist. Patient reports that he does not know hemoglobin A1c level and does not know what it measures. States he checks blood sugar twice daily and target is between 100-200. Reports that daughter fills his medication box but that he takes his medication and administers his own insulin. Gets medications at local pharmacy without problems. Patient reports that he still drives & takes self to doctors' appointment. States he has assistance from children and sometimes daughter goes with him to doctor's appointments.  Objective:  Per Epic patient taking over 10 medications daily. On coumadin therapy. Diagnoses: PVD, persistent atrial fibrillation, diabetes-uncontrolled , COPD, Chronic diastolic heart failure.  Assessment:  Patient is a falls risk due to history of falls; neuropathy of feet, uses cane, medication side effects. Has knowledge deficit regarding his disease process. Has knowledge deficit regarding some of medications. Would benefit from [place] care coordinator.   Plan: will refer to community care coordinator; patient is a falls risk on coumadin therapy; has diabetes--uncontrolled; taking insulin; does not know what Hemoglobin A1c level is.; polypharmacy ; has knowledge deficit. Patient consents to  community care coordinator referral. Will send to [facility] to refer. Sherrin Daisy, RN BSN [place] Management Coordinator [facility] Care Management  [phone]

## 2014-11-11 ENCOUNTER — Other Ambulatory Visit: Payer: Self-pay

## 2014-11-11 NOTE — Patient Outreach (Signed)
[place] [facility]) Care Management  11/11/2014  Stone [facility] March 16, 1922 [id number]  Assessment: referral received per telephonic care coordinator. RNCM called to schedule home visit. Mr. Guillette request RNCM call next week to schedule, stating he will be able to tell better what his schedule will be like next week.  Plan: Call next week.  [name], RN, MSN, [place] Coordinator Cell: [phone]

## 2014-11-15 ENCOUNTER — Other Ambulatory Visit: Payer: Self-pay

## 2014-11-15 NOTE — Patient Outreach (Signed)
[place] [facility]) Care Management  11/15/2014  Ronald [name] [facility] 06-Dec-1922 [id number]  Care Coordination-Call to schedule home visit. No answer. HIPPA compliant message left.  Plan: follow up call within 1-2 days.  [name], RN, MSN, [place] Coordinator Cell: [phone]

## 2014-11-16 DIAGNOSIS — S52571D Other intraarticular fracture of lower end of right radius, subsequent encounter for closed fracture with routine healing: Secondary | ICD-10-CM | POA: Diagnosis not present

## 2014-11-17 ENCOUNTER — Other Ambulatory Visit: Payer: Self-pay

## 2014-11-17 NOTE — Patient Outreach (Signed)
[place] [facility]) Care Management  11/17/2014  Obrien Huskins [facility] 01/06/1923 [id number]   Assessment: per member's request RNCM called to follow up regarding scheduling home visit. Member would like to schedule a time when daughter can be present and request RNCM call daughter, Miki Blank, to schedule home visit. Member reports Ms. Jumper is at his house now, however he provided Ms. Kowalewski's mobile contact number to [facility] in case she was not there. RNCM called both member's home number(no answer) and Ms. Luse cell phone number-HIPPA compliant message left.  Plan: await return call and follow up next week if no return call.  [name], RN, MSN, [place] Coordinator Cell: [phone]

## 2014-11-21 ENCOUNTER — Other Ambulatory Visit: Payer: Self-pay

## 2014-11-21 NOTE — Patient Outreach (Signed)
[place] [facility]) Care Management  11/21/2014  AMALIO LOE 09-Jul-1922 [id number]   Subjective: "he is pretty self sufficient", He is so wobbly  Assessment- Care Coordination-spoke with member's daughter Jaycob Mcclenton. Discussed [facility] Care Management program. [name] reports that member is very independent, but could use assistance with personal care service in getting a bath, stating member has cut showers back to once a week. RNCM discussed the possibility of physical therapy. Ms. Lia Foyer states she is not sure how receptive member will be. [name] states Mr. 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 Ms. Lia Foyer is agreeable to home visit to complete assessment.  Plan: Home visit scheduled for next week.  [name], RN, MSN, [place] Coordinator Cell: [phone]

## 2014-11-23 DIAGNOSIS — N138 Other obstructive and reflux uropathy: Secondary | ICD-10-CM | POA: Diagnosis not present

## 2014-11-23 DIAGNOSIS — Z8551 Personal history of malignant neoplasm of bladder: Secondary | ICD-10-CM | POA: Diagnosis not present

## 2014-11-23 DIAGNOSIS — N401 Enlarged prostate with lower urinary tract symptoms: Secondary | ICD-10-CM | POA: Diagnosis not present

## 2014-11-23 DIAGNOSIS — Z86008 Personal history of in-situ neoplasm of other site: Secondary | ICD-10-CM | POA: Diagnosis not present

## 2014-11-30 ENCOUNTER — Other Ambulatory Visit: Payer: Self-pay

## 2014-11-30 VITALS — BP 136/68 | HR 60 | Resp 20 | Ht 72.0 in | Wt 174.0 lb

## 2014-11-30 DIAGNOSIS — I509 Heart failure, unspecified: Secondary | ICD-10-CM

## 2014-12-01 NOTE — Patient Outreach (Signed)
[place] [facility]) Care Management  [facility] Care Manager  12/01/2014   MANAV PIEROTTI [birth year] [id number]  Subjective: member states he knows about his disease process and how to manage. However, member states he feels that he should have made more progress than he has stating "it seems to take months and months" Member states he would like to be more physically active and have less discomfort in hips. Member states his life now is work and family since his wife passed away.  Objective:  BP 136/68 mmHg  Pulse 60  Resp 20  Ht 1.829 m (6')  Wt 174 lb (78.926 kg)  BMI 23.59 kg/m2  SpO2 99%, lungs clear, heart rate 60.  Current Medications:  Current Outpatient Prescriptions  Medication Sig Dispense Refill  . benazepril (LOTENSIN) 10 MG tablet Take 10 mg by mouth daily.    Marland Kitchen buPROPion (WELLBUTRIN XL) 150 MG 24 hr tablet Take 150 mg by mouth every morning.  1  . doxazosin (CARDURA) 4 MG tablet Take 4 mg by mouth every evening.     . finasteride (PROSCAR) 5 MG tablet Take 5 mg by mouth daily.     . fluticasone (FLONASE) 50 MCG/ACT nasal spray Place 1 spray into both nostrils daily.    . furosemide (LASIX) 40 MG tablet Take 1 tablet (40 mg total) by mouth 2 (two) times daily. 60 tablet 5  . glimepiride (AMARYL) 2 MG tablet Take 2 mg by mouth daily with breakfast.    . insulin glargine (LANTUS) 100 UNIT/ML injection Inject 0.26 mLs (26 Units total) into the skin every morning. (Patient taking differently: Inject 21 Units into the skin every morning. )    . loratadine (CLARITIN) 10 MG tablet Take 10 mg by mouth daily.    . metolazone (ZAROXOLYN) 2.5 MG tablet Take 2.5-5 mg by mouth once a week. For weight >183 or very swollen  2  . metoprolol succinate (TOPROL-XL) 50 MG 24 hr tablet Take 50 mg by mouth 2 (two) times daily. Take with or immediately following a meal.    . PHOSPHATIDYLSERINE PO Take 1 tablet by mouth daily.    . potassium chloride SA (K-DUR,KLOR-CON) 20 MEQ tablet  Take 20 mEq by mouth daily.    . simvastatin (ZOCOR) 20 MG tablet Take 20 mg by mouth every evening.    . traMADol (ULTRAM) 50 MG tablet Take 1 tablet (50 mg total) by mouth 2 (two) times daily. 30 tablet 0  . Umeclidinium-Vilanterol (ANORO ELLIPTA) 62.5-25 MCG/INH AEPB Inhale 1 puff into the lungs daily.    Marland Kitchen warfarin (COUMADIN) 5 MG tablet Take 2.5 mg by mouth every evening.     Marland Kitchen alendronate (FOSAMAX) 70 MG tablet Take 70 mg by mouth every 14 (fourteen) days. Take with a full glass of water on an empty stomach. Sunday    . glyBURIDE (DIABETA) 5 MG tablet Take 2.5-10 mg by mouth 2 (two) times daily with a meal. 2 tablets in the morning and 1/2 tablet in the evening    . HYDROcodone-acetaminophen (NORCO/VICODIN) 5-325 MG per tablet Take 1 tablet by mouth every 6 (six) hours as needed for moderate pain. (Patient not taking: Reported on 11/01/2014) 15 tablet 0   No current facility-administered medications for this visit.    Functional Status:  In your present state of health, do you have any difficulty performing the following activities: 11/30/2014 11/10/2014  Hearing? N Y  Vision? N -  Difficulty concentrating or making decisions? N N  Walking  or climbing stairs? Y Y  Dressing or bathing? N N  Doing errands, shopping? N N  Preparing Food and eating ? Y -  Using the Toilet? N -  In the past six months, have you accidently leaked urine? N -  Do you have problems with loss of bowel control? N -  Managing your Medications? N -  Managing your Finances? N -  Housekeeping or managing your Housekeeping? N -    Fall/Depression Screening: PHQ 2/9 Scores 11/30/2014  PHQ - 2 Score 0   Fall Risk  11/30/2014 11/30/2014 11/10/2014  Falls in the past year? Yes Yes Yes  Number falls in past yr: - 2 or more 2 or more  Injury with Fall? Yes Yes Yes  Risk Factor Category  High Fall Risk High Fall Risk -  Risk for fall due to : History of fall(s);Impaired mobility History of fall(s);Impaired mobility  History of fall(s);Impaired mobility  Follow up Education provided;Falls prevention discussed Education provided;Falls prevention discussed Falls prevention discussed   THN CM Care Plan Problem One        Most Recent Value   Care Plan Problem One  Needs to establish care with a cardiologist.   Role Documenting the Problem One  Care Management Watchtower for Problem One  Active   THN CM Short Term Goal #1 (0-30 days)  member/family will schedule an appointment with cardiologist within the next 30 days.   THN CM Short Term Goal #1 Start Date  11/29/14   Interventions for Short Term Goal #1  discussed importance of following up with cardiologist, encouraged member/family to discuss with primary care.     Assessment: Referral from the High Risk list-Home visit completed. Present during visit was member, his daughter, Ahyan Kreeger and son-in-law. Member is alert, oriented. Lives alone and has a very supportive family. Member has a very supportive family. Mr. 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 His daughter manages his medications and member takes his medications as prescribed. In addition member acknowledges that he takes "a shot of Vinerger" every day.  History of diabetes-Mr. covin states he checks his blood sugar and records them daily. This morning his blood sugar was 183. He also has a history of heartfailure. RNCM discussed signs/symptoms of exacerbation. Mrs. 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 Does not want any assistance with disease management or additional education. Discussed EMMI material for reinforcement. Member declines.  Current everyday smoker-member is not interested in smoking cessation of any kind at this time.  History of falls with fractures to both hips and recent fall with fracture to left wrist. Mr. Simson gets around on his mobile scooter  most of the time. He states he will only walk a short distance with walker.  Mr. Richert also reports discomfort sometimes in his hips. RNCM discussed additional physical therapy although he is currently not home bound as he works every day. Also discussed outpatient therapy, which Mr. Bayus did not want to do at this time. RNCM discussed program with the [employer] for those with health issues, could possibly assist with socialization as well as increase activity, strengthen muscles and reduce hip discomfort. Mr. Franssen was receptive to receiving information. Discussed Life alert-member instead is opting to continue to use "Jitterbug" for emergency assisance, but is willing to receive Life-alert literature.   RNCM discussed follow up  with cardiologist. Per member's daughter member has to find another cardiologist. Ms. Lia Foyer reports she will find another cardiologist and schedule an appointment for member.  Plan: social work consult for community resources, telephonic follow up within the next 1-2 weeks.  [name], RN, MSN, [place] Coordinator Cell: [phone]

## 2014-12-02 NOTE — Patient Outreach (Signed)
[place] [facility]) Care Management  12/02/2014  Ronald [name] [birth year] [id number]   Request from [name], RN to assign SW, assigned [name], [place].  Thanks, [name]. [place], [place] Assistant Phone: [phone] Fax: [phone]

## 2014-12-05 DIAGNOSIS — L820 Inflamed seborrheic keratosis: Secondary | ICD-10-CM | POA: Diagnosis not present

## 2014-12-07 DIAGNOSIS — I482 Chronic atrial fibrillation: Secondary | ICD-10-CM | POA: Diagnosis not present

## 2014-12-07 DIAGNOSIS — R531 Weakness: Secondary | ICD-10-CM | POA: Diagnosis not present

## 2014-12-07 DIAGNOSIS — I4519 Other right bundle-branch block: Secondary | ICD-10-CM | POA: Diagnosis not present

## 2014-12-07 DIAGNOSIS — I350 Nonrheumatic aortic (valve) stenosis: Secondary | ICD-10-CM | POA: Diagnosis not present

## 2014-12-07 DIAGNOSIS — E1140 Type 2 diabetes mellitus with diabetic neuropathy, unspecified: Secondary | ICD-10-CM | POA: Diagnosis not present

## 2014-12-08 ENCOUNTER — Other Ambulatory Visit: Payer: Self-pay | Admitting: Licensed Clinical Social Worker

## 2014-12-08 NOTE — Patient Outreach (Signed)
[place] [facility]) Care Management  12/08/2014  Alyas Creary Georgann Housekeeper February 11, 1923 [id number]

## 2014-12-09 NOTE — Patient Outreach (Addendum)
[place] [facility]) Care Management  12/09/2014  FABRIZZIO MARCELLA 12-08-1922 [id number]   Assessment-CSW received referral to help provide patient and patient's family with [employer] and [place] within the area information. Per patient's request, CSW was to contact patient's daughter [place]. CSW left a HIPPA compliant voice message encouraging family to contact CSW when they are available.  Plan-CSW will await call back from patient's daughter to provide information.  [name], BSW, MSW, [email or web address] Phone: [phone] Fax: [phone]

## 2014-12-13 ENCOUNTER — Other Ambulatory Visit: Payer: Self-pay | Admitting: Licensed Clinical Social Worker

## 2014-12-13 NOTE — Patient Outreach (Signed)
[facility]) Care Management  12/13/2014  Ronald [name] [facility] 06-28-1922 [id number]   Assessment-CSW contacted patient and received HIPPA verifications. CSW introduced self, reason for call and of [place]. Patient stated he could only talk for a few moments but was agreeable to completing psychosocial assessment and was agreeable to his daughter receiving information on resources to increase his activity. Patient stated that he would rather CSW communicate with his daughter as he works often.  CSW completed outreach to patient's daughter Novosel per patient's request. Ronald [name] provided HIPPA verifications. CSW introduced self, reason for call and of [facility] social work services. Landrigan reports that patient had expressed interest in becoming more active to alleviate discomfort but that "he usually does not follow through with things that take time out his work schedule." Parkerson reports that he works long hours and "barely takes time to do anything else but work because he loves it." Schliep states that he had a previous interest in the [place] but did not end up following through due to work. CSW educated Derner on senior resources that could help patient become more active including [employer], [employer] and the [employer]. Bufford was interested in additional information on the [facility] including the address which she stated needed to be close to his work in order for patient to attend. CSW informed Prevette that the [facility] is 3.5 miles away from his residence. CSW informed [place] that [facility] had classes for free and some with a few dollars cost. CSW educated [place] on a few of their activities such as Building services engineer, Water Arthritis class, Basketball, Full Body strength class, Caregiver support group, Soul Sliding class, Cardio Sculpt class and more. Archey states that she is going into work now and would like to have time to [employer] and then contact [place] back at a different time.  Plan-CSW will await call back from patient's daughter in order to provide additional information on ways patient can be more active within the community.  [name], BSW, MSW, [email or web address] Phone: [phone] Fax: [phone]

## 2014-12-14 DIAGNOSIS — Z23 Encounter for immunization: Secondary | ICD-10-CM | POA: Diagnosis not present

## 2014-12-14 DIAGNOSIS — Z7901 Long term (current) use of anticoagulants: Secondary | ICD-10-CM | POA: Diagnosis not present

## 2014-12-14 DIAGNOSIS — I4891 Unspecified atrial fibrillation: Secondary | ICD-10-CM | POA: Diagnosis not present

## 2014-12-16 ENCOUNTER — Other Ambulatory Visit: Payer: Self-pay

## 2014-12-16 NOTE — Patient Outreach (Signed)
[place] [facility]) Care Management  12/16/2014  Chaysen Tillman [facility] [birth year] [id number]  Assessment: Call to [facility] daughter-per member's request for follow up. No answer. HIPPA compliant message left.  Plan: RNCM will await return call. Follow up next week if not return call.  [name], RN, MSN, [place] Coordinator Cell: [phone]

## 2014-12-20 DIAGNOSIS — Z6824 Body mass index (BMI) 24.0-24.9, adult: Secondary | ICD-10-CM | POA: Diagnosis not present

## 2014-12-20 DIAGNOSIS — I4891 Unspecified atrial fibrillation: Secondary | ICD-10-CM | POA: Diagnosis not present

## 2014-12-20 DIAGNOSIS — Z7901 Long term (current) use of anticoagulants: Secondary | ICD-10-CM | POA: Diagnosis not present

## 2014-12-20 DIAGNOSIS — E1151 Type 2 diabetes mellitus with diabetic peripheral angiopathy without gangrene: Secondary | ICD-10-CM | POA: Diagnosis not present

## 2014-12-20 DIAGNOSIS — Z952 Presence of prosthetic heart valve: Secondary | ICD-10-CM | POA: Diagnosis not present

## 2014-12-20 DIAGNOSIS — M810 Age-related osteoporosis without current pathological fracture: Secondary | ICD-10-CM | POA: Diagnosis not present

## 2014-12-20 DIAGNOSIS — N183 Chronic kidney disease, stage 3 (moderate): Secondary | ICD-10-CM | POA: Diagnosis not present

## 2014-12-20 DIAGNOSIS — R2689 Other abnormalities of gait and mobility: Secondary | ICD-10-CM | POA: Diagnosis not present

## 2014-12-20 DIAGNOSIS — I272 Other secondary pulmonary hypertension: Secondary | ICD-10-CM | POA: Diagnosis not present

## 2014-12-20 DIAGNOSIS — I130 Hypertensive heart and chronic kidney disease with heart failure and stage 1 through stage 4 chronic kidney disease, or unspecified chronic kidney disease: Secondary | ICD-10-CM | POA: Diagnosis not present

## 2014-12-20 DIAGNOSIS — I509 Heart failure, unspecified: Secondary | ICD-10-CM | POA: Diagnosis not present

## 2014-12-20 DIAGNOSIS — I2510 Atherosclerotic heart disease of native coronary artery without angina pectoris: Secondary | ICD-10-CM | POA: Diagnosis not present

## 2014-12-21 ENCOUNTER — Other Ambulatory Visit: Payer: Self-pay

## 2014-12-21 ENCOUNTER — Other Ambulatory Visit: Payer: Self-pay | Admitting: Licensed Clinical Social Worker

## 2014-12-21 NOTE — Patient Outreach (Signed)
[place] [facility]) Care Management  12/21/2014  Ronald [name] Jul 17, 1922 [id number]  Care Coordination: RNCM called Orlene Plum, member's daughter to follow up following home visit. Voice message left on voicemail. Then RNCM immediately received return call from someone responding to the call. RNCM left message with gentleman returning the call. He confirmed that Mrs. Ronald [name] has RNCM's contact number. Second attempt to reach [name].  Plan: Await return call. Follow up call in a week if no response.  [name], RN, MSN, [place] Coordinator Cell: [phone]

## 2014-12-21 NOTE — Patient Outreach (Signed)
[place] [facility]) Care Management  12/21/2014  DELMONT PROSCH 14-Feb-1923 [id number]   Assessment-CSW contacted patient's daughter Prokop on 12/21/14 and received HIPPA verifications. Feil reports that she was able to research the [facility] and feels that this would be a great way for patient to gain activity and socialization. Yohn printed off several documents on the center and plans to provide these documents to patient. Stock[place]en has concerns on whether he will be willing to attend but is appreciative of social work assistance. Although, the patient's daughter reports that the center is close to both his work and home which could motivate him to go to the center. Peerson declines any further social work needs and is agreeable to CSW discharging patient from her caseload.   Plan-CSW will inform RNCM of discharge.  [name], BSW, MSW, [email or web address] Phone: [phone] Fax: [phone]

## 2014-12-26 ENCOUNTER — Other Ambulatory Visit: Payer: Self-pay

## 2014-12-26 NOTE — Patient Outreach (Signed)
[place] [facility]) Care Management  12/26/2014  ARAM DOMZALSKI 1922/04/22 [id number]  Care Coordination: RNCM called member's daughter, Dmetrius Ambs, to follow up regarding member. Last conversation, [name] was going to find a new cardiologist for member. No answer. HIPPA compliant message left-third attempt.  Plan: per policy and procedure-Send outreach letter.  [name], RN, MSN, [place] Coordinator Cell: [phone]

## 2014-12-27 DIAGNOSIS — S52571D Other intraarticular fracture of lower end of right radius, subsequent encounter for closed fracture with routine healing: Secondary | ICD-10-CM | POA: Diagnosis not present

## 2014-12-30 DIAGNOSIS — I4891 Unspecified atrial fibrillation: Secondary | ICD-10-CM | POA: Diagnosis not present

## 2014-12-30 DIAGNOSIS — Z7901 Long term (current) use of anticoagulants: Secondary | ICD-10-CM | POA: Diagnosis not present

## 2014-12-30 DIAGNOSIS — E1151 Type 2 diabetes mellitus with diabetic peripheral angiopathy without gangrene: Secondary | ICD-10-CM | POA: Diagnosis not present

## 2015-01-03 DIAGNOSIS — D485 Neoplasm of uncertain behavior of skin: Secondary | ICD-10-CM | POA: Diagnosis not present

## 2015-01-03 DIAGNOSIS — L700 Acne vulgaris: Secondary | ICD-10-CM | POA: Diagnosis not present

## 2015-01-03 DIAGNOSIS — C44622 Squamous cell carcinoma of skin of right upper limb, including shoulder: Secondary | ICD-10-CM | POA: Diagnosis not present

## 2015-01-06 DIAGNOSIS — M47816 Spondylosis without myelopathy or radiculopathy, lumbar region: Secondary | ICD-10-CM | POA: Diagnosis not present

## 2015-01-11 ENCOUNTER — Other Ambulatory Visit: Payer: Self-pay

## 2015-01-11 NOTE — Patient Outreach (Signed)
[place] [facility]) Care Management  01/11/2015  Anderw Cho [facility] [birth year] [id number]  Assessment: RNCM has attempted to reach member/daughter per policy/procedure with no response.  Plan: Close case. Send case closure letter.  [name], RN, MSN, [place] Coordinator Cell: [phone]

## 2015-01-27 ENCOUNTER — Other Ambulatory Visit: Payer: Self-pay

## 2015-01-27 NOTE — Patient Outreach (Signed)
[place] [facility]) Care Management  01/27/2015  Ronald [name] 1922/06/10 [id number]   Received call from member who reports he received a letter. RNCM explained the case closure letter. RNCM questioned member regarding any change in condition. Member denies. Reports history of heart failure states he takes zaroxyln as needed for weight increase of 3 pounds overnight as instructed by Dr. [name]. Member acknowledges that he has an appointment with Dr. [name] cardiologist on December 9th. RNCM discussed resources provided by [place] worker for [employer] center. Member denies any additional care management needs. Denies educational needs for disease management. RNCM informed member that case could be reopened if his needs changed. Member voiced understanding.  Plan: Case remains closed.  [name], RN, MSN, [place] Coordinator Cell: [phone]

## 2015-02-02 DIAGNOSIS — M47816 Spondylosis without myelopathy or radiculopathy, lumbar region: Secondary | ICD-10-CM | POA: Diagnosis not present

## 2015-02-03 ENCOUNTER — Ambulatory Visit (INDEPENDENT_AMBULATORY_CARE_PROVIDER_SITE_OTHER): Payer: Medicare Other | Admitting: Cardiology

## 2015-02-03 ENCOUNTER — Encounter: Payer: Self-pay | Admitting: Cardiology

## 2015-02-03 ENCOUNTER — Other Ambulatory Visit: Payer: Self-pay

## 2015-02-03 VITALS — BP 122/60 | HR 65 | Ht 72.0 in | Wt 176.1 lb

## 2015-02-03 DIAGNOSIS — I5032 Chronic diastolic (congestive) heart failure: Secondary | ICD-10-CM

## 2015-02-03 DIAGNOSIS — E1151 Type 2 diabetes mellitus with diabetic peripheral angiopathy without gangrene: Secondary | ICD-10-CM | POA: Diagnosis not present

## 2015-02-03 DIAGNOSIS — I509 Heart failure, unspecified: Secondary | ICD-10-CM | POA: Diagnosis not present

## 2015-02-03 DIAGNOSIS — I10 Essential (primary) hypertension: Secondary | ICD-10-CM

## 2015-02-03 DIAGNOSIS — I481 Persistent atrial fibrillation: Secondary | ICD-10-CM

## 2015-02-03 DIAGNOSIS — I4819 Other persistent atrial fibrillation: Secondary | ICD-10-CM

## 2015-02-03 DIAGNOSIS — E785 Hyperlipidemia, unspecified: Secondary | ICD-10-CM

## 2015-02-03 DIAGNOSIS — Z7901 Long term (current) use of anticoagulants: Secondary | ICD-10-CM | POA: Diagnosis not present

## 2015-02-03 DIAGNOSIS — M545 Low back pain: Secondary | ICD-10-CM | POA: Diagnosis not present

## 2015-02-03 DIAGNOSIS — I350 Nonrheumatic aortic (valve) stenosis: Secondary | ICD-10-CM

## 2015-02-03 DIAGNOSIS — R627 Adult failure to thrive: Secondary | ICD-10-CM | POA: Diagnosis not present

## 2015-02-03 DIAGNOSIS — I739 Peripheral vascular disease, unspecified: Secondary | ICD-10-CM

## 2015-02-03 DIAGNOSIS — I2510 Atherosclerotic heart disease of native coronary artery without angina pectoris: Secondary | ICD-10-CM | POA: Diagnosis not present

## 2015-02-03 DIAGNOSIS — Z6824 Body mass index (BMI) 24.0-24.9, adult: Secondary | ICD-10-CM | POA: Diagnosis not present

## 2015-02-03 DIAGNOSIS — I4891 Unspecified atrial fibrillation: Secondary | ICD-10-CM | POA: Diagnosis not present

## 2015-02-03 DIAGNOSIS — I130 Hypertensive heart and chronic kidney disease with heart failure and stage 1 through stage 4 chronic kidney disease, or unspecified chronic kidney disease: Secondary | ICD-10-CM | POA: Diagnosis not present

## 2015-02-03 LAB — BASIC METABOLIC PANEL
BUN: 48 mg/dL — AB (ref 7–25)
CHLORIDE: 96 mmol/L — AB (ref 98–110)
CO2: 28 mmol/L (ref 20–31)
CREATININE: 2 mg/dL — AB (ref 0.70–1.11)
Calcium: 9.8 mg/dL (ref 8.6–10.3)
Glucose, Bld: 234 mg/dL — ABNORMAL HIGH (ref 65–99)
Potassium: 4.1 mmol/L (ref 3.5–5.3)
Sodium: 139 mmol/L (ref 135–146)

## 2015-02-03 NOTE — Patient Instructions (Signed)
Medication Instructions:  Your physician recommends that you continue on your current medications as directed. Please refer to the Current Medication list given to you today.   Labwork: TODAY: BMET, BNP  Testing/Procedures: Your physician has requested that you have an echocardiogram. Echocardiography is a painless test that uses sound waves to create images of your heart. It provides your doctor with information about the size and shape of your heart and how well your heart's chambers and valves are working. This procedure takes approximately one hour. There are no restrictions for this procedure.  Follow-Up: You have been referred to DR. ARIDA.  Your physician wants you to follow-up in: 6 months with Dr. Radford Pax. You will receive a reminder letter in the mail two months in advance. If you don't receive a letter, please call our office to schedule the follow-up appointment.   Any Other Special Instructions Will Be Listed Below (If Applicable).     If you need a refill on your cardiac medications before your next appointment, please call your pharmacy.

## 2015-02-03 NOTE — Progress Notes (Signed)
Cardiology Office Note   Date:  02/03/2015   ID:  Ronald [name], DOB 1922-07-25, MRN [id number]  PCP:  Precious Reel, MD    Chief Complaint  Patient presents with  . New Evaluation    CHF      History of Present Illness: Ronald [name] is a 81 y.o. male who presents for establishment of a new Cardiologist.  He has a history of aortic stenosis s/p bovine AVR in 2012 with normal coronary arteries at that time and normal LVF.  He has a history of right renal artery stenosis and LE PVD and has had stenting of the Right renal artery and both iliac arteries by Dr. Deon Pilling.  He has a history of COPD, HTN, chronic atrial fibrillation on Coumadin, dyslipidemia and type 2 DM.  He is doing well today.  He denies any chest pain, SOB, DOE, LE edema, dizziness, palpitations or syncope.  He continues to smoke 1ppd.      Past Medical History  Diagnosis Date  . AS (aortic stenosis)     bovine aortic valve replacement 01/07/11  . HTN (hypertension)   . Thrombocytopenia due to drugs     seen by Dr Inda Merlin plts 114000 no rx  . Peripheral vascular disease ([place]) very poor circulation legs and feet ... stents right and left legs... done in dr j. Gwenlyn Found 's office.   . Depression wife died 4 years ago.    Marland Kitchen COPD (chronic obstructive pulmonary disease) ([place])   . Shortness of breath   . Recurrent upper respiratory infection (URI)     sinusitis  . Claudication in peripheral vascular disease ([place]) 06/17/2011  . S/P angioplasty with stent, diamond back rotational athrectomy Prox. Rt. SFA 06/17/2011 06/17/2011  . Neuropathy due to secondary diabetes ([place])   . Type II diabetes mellitus ([place])   . Pneumonia 07/25/11    left  . Persistent atrial fibrillation ([place])   . Blood transfusion     w/hip operation  . History of stomach ulcers ~ 1951  . Renal artery stenosis ([place]) 2006    renal artery stent  . Bladder cancer [facility])     Bladder Cancer local  . Cellulitis of left lower extremity   .  Chronic diastolic congestive heart failure ([place])   . Normal coronary arteries Sept 2012    Past Surgical History  Procedure Laterality Date  . Peripheral arterial stent graft      2006 left anf right illiac stents Dr Deon Pilling  . Aortic valve replacement  01/07/2011    Procedure: AORTIC VALVE REPLACEMENT (AVR);  Surgeon: Grace Isaac, MD;  Location: [place];  Service: Open Heart Surgery;  Laterality: N/A;; magna-ease bovine 69mm bioprosthesis  . Cardioversion  04/03/2011    Procedure: CARDIOVERSION;  Surgeon: [place]ie Man, MD;  Location: [place];  Service: Cardiovascular;  Laterality: N/A;  . Lower extremity angiogram  06/17/2011    diamondback orbital rotational and cutting balloon atherectomy of the prox R SFA  . Femur im nail  06/27/2011    Procedure: INTRAMEDULLARY (IM) NAIL FEMORAL;  Surgeon: Marin Shutter, MD;  Location: [facility];  Service: Orthopedics;  Laterality: Right;  . Tonsillectomy and adenoidectomy      "when I was a kid"  . Cholecystectomy    . Cataract extraction w/ intraocular lens  implant, bilateral  ~ 2007  . Renal artery stent  2006    "  I believe"  . Cardiac catheterization  11/19/10    normal coronaries, mod AS, 75% l RAS  . Femur im nail Left 12/14/2013    Procedure: INTRAMEDULLARY (IM) NAIL FEMORAL;  Surgeon: Mauri Pole, MD;  [facility];  Service: Orthopedics;  Laterality: Left;  . Atherectomy N/A 06/17/2011    Procedure: ATHERECTOMY;  Surgeon: Lorretta Harp, MD;  Location: [facility] CATH LAB;  Service: Cardiovascular;  Laterality: N/A;  . Lower extremity angiogram Bilateral 06/17/2011    Procedure: LOWER EXTREMITY ANGIOGRAM;  Surgeon: Lorretta Harp, MD;  Location: [facility] CATH LAB;  Service: Cardiovascular;  Laterality: Bilateral;  . Abdominal angiogram  06/17/2011    Procedure: ABDOMINAL ANGIOGRAM;  Surgeon: Lorretta Harp, MD;  Location: [facility] CATH LAB;  Service: Cardiovascular;;     Current Outpatient Prescriptions  Medication Sig Dispense Refill  .  alendronate (FOSAMAX) 70 MG tablet Take 70 mg by mouth every 14 (fourteen) days. Take with a full glass of water on an empty stomach. Sunday    . benazepril (LOTENSIN) 10 MG tablet Take 10 mg by mouth daily.    Marland Kitchen buPROPion (WELLBUTRIN XL) 150 MG 24 hr tablet Take 150 mg by mouth every morning.  1  . doxazosin (CARDURA) 4 MG tablet Take 4 mg by mouth every evening.     . finasteride (PROSCAR) 5 MG tablet Take 5 mg by mouth daily.     . fluticasone (FLONASE) 50 MCG/ACT nasal spray Place 1 spray into both nostrils daily.    . furosemide (LASIX) 40 MG tablet Take 1 tablet (40 mg total) by mouth 2 (two) times daily. 60 tablet 5  . glimepiride (AMARYL) 2 MG tablet Take 2 mg by mouth daily with breakfast.    . glyBURIDE (DIABETA) 5 MG tablet Take 2.5-10 mg by mouth 2 (two) times daily with a meal. 2 tablets in the morning and 1/2 tablet in the evening    . HYDROcodone-acetaminophen (NORCO/VICODIN) 5-325 MG per tablet Take 1 tablet by mouth every 6 (six) hours as needed for moderate pain. 15 tablet 0  . insulin glargine (LANTUS) 100 UNIT/ML injection Inject 0.26 mLs (26 Units total) into the skin every morning. (Patient taking differently: Inject 21 Units into the skin every morning. )    . LANTUS SOLOSTAR 100 UNIT/ML Solostar Pen Inject 24 Units into the skin daily.  3  . loratadine (CLARITIN) 10 MG tablet Take 10 mg by mouth daily.    . metFORMIN (GLUCOPHAGE) 500 MG tablet Take 500 mg by mouth daily.  5  . metolazone (ZAROXOLYN) 2.5 MG tablet Take 2.5-5 mg by mouth once a week. For weight >183 or very swollen  2  . metoprolol succinate (TOPROL-XL) 50 MG 24 hr tablet Take 50 mg by mouth 2 (two) times daily. Take with or immediately following a meal.    . PHOSPHATIDYLSERINE PO Take 1 tablet by mouth daily.    . potassium chloride SA (K-DUR,KLOR-CON) 20 MEQ tablet Take 20 mEq by mouth daily.    . Salicylic Acid-Cleanser 6 % (LOTION) KIT Place 1 application onto the skin daily.  2  . simvastatin (ZOCOR) 20  MG tablet Take 20 mg by mouth every evening.    . traMADol (ULTRAM) 50 MG tablet Take 1 tablet (50 mg total) by mouth 2 (two) times daily. 30 tablet 0  . Umeclidinium-Vilanterol (ANORO ELLIPTA) 62.5-25 MCG/INH AEPB Inhale 1 puff into the lungs daily.    Marland Kitchen warfarin (COUMADIN) 2 MG tablet Take 2 mg by  mouth daily.  5  . warfarin (COUMADIN) 5 MG tablet Take 2.5 mg by mouth every evening.      No current facility-administered medications for this visit.    Allergies:   Aspirin    Social History:  The patient  reports that he has been smoking Cigarettes.  He has a 112.5 pack-year smoking history. He has never used smokeless tobacco. He reports that he does not drink alcohol or use illicit drugs.   Family History:  The patient's family history includes Cancer in his brother; Heart disease in his mother; Stroke in his father.    ROS:  Please see the history of present illness.   Otherwise, review of systems are positive for none.   All other systems are reviewed and negative.    PHYSICAL EXAM: VS:  BP 122/60 mmHg  Pulse 65  Ht 6' (1.829 m)  Wt 176 lb 1.9 oz (79.888 kg)  BMI 23.88 kg/m2  SpO2 98% , BMI Body mass index is 23.88 kg/(m^2). GEN: Well nourished, well developed, in no acute distress HEENT: normal Neck: no JVD, carotid bruits, or masses Cardiac: RRR; no rubs, or gallops,no edema.  2/6 SM at RUSB to LLSB Respiratory:  clear to auscultation bilaterally, normal work of breathing GI: soft, nontender, nondistended, + BS MS: no deformity or atrophy Skin: warm and dry, no rash Neuro:  Strength and sensation are intact Psych: euthymic mood, full affect   EKG:  EKG is not ordered today.    Recent Labs: 11/01/2014: B Natriuretic Peptide 239.7*; BUN 28*; Creatinine, Ser 1.56*; Hemoglobin 11.5*; Platelets 95*; Potassium 4.1; Sodium 139    Lipid Panel No results found for: CHOL, TRIG, HDL, CHOLHDL, VLDL, LDLCALC, LDLDIRECT    Wt Readings from Last 3 Encounters:  02/03/15 176 lb  1.9 oz (79.888 kg)  11/30/14 174 lb (78.926 kg)  01/03/14 178 lb 12.8 oz (81.103 kg)       ASSESSMENT AND PLAN:  1.  Severe AS s/p bovine AVR 2.  HTN - controlled on current medical regimen.   3.  PVD of renal artery and bilateral iliac disease s/p right renal artery stenosis.  He is having more LE weakness.   I will refer him to Dr. Fletcher Anon at his request for further evaluation.   4.  Chronic diastolic CHF - he appears euvolemic on exam.  He does complain of intermittent LE edema but none on exam today.  Continue lasix/BB.  CHeck BNP. 5.  Chronic atrial fibrillation on systemic anticoagulation and rate controlled on BB and warfarin.     Current medicines are reviewed at length with the patient today.  The patient does not have concerns regarding medicines.  The following changes have been made:  no change  Labs/ tests ordered today: See above Assessment and Plan No orders of the defined types were placed in this encounter.     Disposition:   FU with me in 6 months  Signed, Sueanne Margarita, MD  02/03/2015 2:44 PM    [facility] [address], [place], [place]  [address] Phone: [phone]; Fax: [phone]

## 2015-02-04 LAB — BRAIN NATRIURETIC PEPTIDE: Brain Natriuretic Peptide: 245.6 pg/mL — ABNORMAL HIGH (ref 0.0–100.0)

## 2015-02-07 ENCOUNTER — Telehealth: Payer: Self-pay

## 2015-02-07 DIAGNOSIS — E785 Hyperlipidemia, unspecified: Secondary | ICD-10-CM

## 2015-02-07 DIAGNOSIS — I5032 Chronic diastolic (congestive) heart failure: Secondary | ICD-10-CM

## 2015-02-07 DIAGNOSIS — I4891 Unspecified atrial fibrillation: Secondary | ICD-10-CM

## 2015-02-07 DIAGNOSIS — I10 Essential (primary) hypertension: Secondary | ICD-10-CM

## 2015-02-07 NOTE — Telephone Encounter (Signed)
Informed patient of results and verbal understanding expressed.  Instructed patient to STOP BENAZEPRIL.  Patient st he took an extra lasix for 2 days and weight is back down to normal, so no extra lasix instructed. BMET, BNP, and FLP scheduled for Friday. Patient agrees with treatment plan.

## 2015-02-07 NOTE — Telephone Encounter (Signed)
-----   Message from Sueanne Margarita, MD sent at 02/05/2015  9:19 PM EST ----- Also please stop benazepril due to increase creatinine and recheck Bmet on Friday

## 2015-02-09 ENCOUNTER — Encounter: Payer: Self-pay | Admitting: Cardiology

## 2015-02-09 DIAGNOSIS — C44622 Squamous cell carcinoma of skin of right upper limb, including shoulder: Secondary | ICD-10-CM | POA: Diagnosis not present

## 2015-02-10 ENCOUNTER — Other Ambulatory Visit (INDEPENDENT_AMBULATORY_CARE_PROVIDER_SITE_OTHER): Payer: Medicare Other | Admitting: *Deleted

## 2015-02-10 ENCOUNTER — Telehealth: Payer: Self-pay | Admitting: Cardiology

## 2015-02-10 DIAGNOSIS — I5032 Chronic diastolic (congestive) heart failure: Secondary | ICD-10-CM

## 2015-02-10 DIAGNOSIS — E785 Hyperlipidemia, unspecified: Secondary | ICD-10-CM

## 2015-02-10 LAB — BASIC METABOLIC PANEL
BUN: 32 mg/dL — AB (ref 7–25)
CO2: 33 mmol/L — ABNORMAL HIGH (ref 20–31)
CREATININE: 1.44 mg/dL — AB (ref 0.70–1.11)
Calcium: 9.4 mg/dL (ref 8.6–10.3)
Chloride: 99 mmol/L (ref 98–110)
GLUCOSE: 182 mg/dL — AB (ref 65–99)
POTASSIUM: 4.2 mmol/L (ref 3.5–5.3)
Sodium: 141 mmol/L (ref 135–146)

## 2015-02-10 LAB — BRAIN NATRIURETIC PEPTIDE: BRAIN NATRIURETIC PEPTIDE: 188.8 pg/mL — AB (ref 0.0–100.0)

## 2015-02-10 LAB — LIPID PANEL
CHOL/HDL RATIO: 2.2 ratio (ref <=5.0)
Cholesterol: 94 mg/dL — ABNORMAL LOW (ref 125–200)
HDL: 43 mg/dL (ref >=40)
LDL Cholesterol: 42 mg/dL (ref <130)
Triglycerides: 44 mg/dL (ref <150)
VLDL: 9 mg/dL (ref <30)

## 2015-02-10 NOTE — Telephone Encounter (Signed)
New Message  Pt daughter requested to speak w/ RN about why pt has lab work scheduled for today. Please call back and discuss.

## 2015-02-10 NOTE — Telephone Encounter (Signed)
Left message to call back  

## 2015-02-13 NOTE — Telephone Encounter (Signed)
Patient had lab work drawn Friday. Will call with results.

## 2015-02-21 ENCOUNTER — Other Ambulatory Visit: Payer: Self-pay

## 2015-02-21 ENCOUNTER — Ambulatory Visit (HOSPITAL_COMMUNITY): Payer: Medicare Other | Attending: Cardiovascular Disease

## 2015-02-21 DIAGNOSIS — Z954 Presence of other heart-valve replacement: Secondary | ICD-10-CM | POA: Insufficient documentation

## 2015-02-21 DIAGNOSIS — E119 Type 2 diabetes mellitus without complications: Secondary | ICD-10-CM | POA: Diagnosis not present

## 2015-02-21 DIAGNOSIS — I071 Rheumatic tricuspid insufficiency: Secondary | ICD-10-CM | POA: Insufficient documentation

## 2015-02-21 DIAGNOSIS — I351 Nonrheumatic aortic (valve) insufficiency: Secondary | ICD-10-CM | POA: Insufficient documentation

## 2015-02-21 DIAGNOSIS — E785 Hyperlipidemia, unspecified: Secondary | ICD-10-CM | POA: Insufficient documentation

## 2015-02-21 DIAGNOSIS — I059 Rheumatic mitral valve disease, unspecified: Secondary | ICD-10-CM | POA: Insufficient documentation

## 2015-02-21 DIAGNOSIS — I350 Nonrheumatic aortic (valve) stenosis: Secondary | ICD-10-CM

## 2015-02-23 ENCOUNTER — Telehealth: Payer: Self-pay

## 2015-02-23 DIAGNOSIS — I351 Nonrheumatic aortic (valve) insufficiency: Secondary | ICD-10-CM

## 2015-02-23 DIAGNOSIS — L821 Other seborrheic keratosis: Secondary | ICD-10-CM | POA: Diagnosis not present

## 2015-02-23 DIAGNOSIS — I350 Nonrheumatic aortic (valve) stenosis: Secondary | ICD-10-CM

## 2015-02-23 DIAGNOSIS — D485 Neoplasm of uncertain behavior of skin: Secondary | ICD-10-CM | POA: Diagnosis not present

## 2015-02-23 DIAGNOSIS — L570 Actinic keratosis: Secondary | ICD-10-CM | POA: Diagnosis not present

## 2015-02-23 DIAGNOSIS — C44629 Squamous cell carcinoma of skin of left upper limb, including shoulder: Secondary | ICD-10-CM | POA: Diagnosis not present

## 2015-02-23 NOTE — Telephone Encounter (Signed)
Informed patient of results and verbal understanding expressed.   Repeat ECHO ordered to be scheduled in one year. Patient agrees with treatment plan. 

## 2015-02-23 NOTE — Telephone Encounter (Signed)
-----   Message from Sueanne Margarita, MD sent at 02/22/2015 12:23 AM EST ----- Echo showed normal LVF with stable AVR and mild to moderate AR, severely dilated RA/LA and mild to moderate TR.  Repeat echo in 1 year to follow AR

## 2015-02-24 DIAGNOSIS — Z7901 Long term (current) use of anticoagulants: Secondary | ICD-10-CM | POA: Diagnosis not present

## 2015-02-24 DIAGNOSIS — I4891 Unspecified atrial fibrillation: Secondary | ICD-10-CM | POA: Diagnosis not present

## 2015-02-24 DIAGNOSIS — Z952 Presence of prosthetic heart valve: Secondary | ICD-10-CM | POA: Diagnosis not present

## 2015-03-06 ENCOUNTER — Other Ambulatory Visit: Payer: Self-pay | Admitting: Licensed Clinical Social Worker

## 2015-03-07 ENCOUNTER — Ambulatory Visit (INDEPENDENT_AMBULATORY_CARE_PROVIDER_SITE_OTHER): Payer: PPO | Admitting: Cardiovascular Disease

## 2015-03-07 ENCOUNTER — Encounter: Payer: Self-pay | Admitting: Cardiovascular Disease

## 2015-03-07 VITALS — BP 122/54 | HR 64 | Ht 71.0 in | Wt 179.4 lb

## 2015-03-07 DIAGNOSIS — E785 Hyperlipidemia, unspecified: Secondary | ICD-10-CM

## 2015-03-07 DIAGNOSIS — I739 Peripheral vascular disease, unspecified: Secondary | ICD-10-CM | POA: Diagnosis not present

## 2015-03-07 DIAGNOSIS — Z720 Tobacco use: Secondary | ICD-10-CM | POA: Diagnosis not present

## 2015-03-07 NOTE — Progress Notes (Signed)
Primary cardiologist: Dr. Radford Pax.  HPI  This is a 82 year old male who was referred by Dr. Radford Pax for management of peripheral arterial disease. He has a history of aortic stenosis s/p bovine AVR in 2012 with normal coronary arteries at that time and normal LVF.There is reported history of right renal artery stenting and iliac stenting by Dr. Grace Isaac the past. He has been managed by Dr. Gwenlyn Found at some point and underwent angiography in 2013 which showed patent iliac arteries with high-grade bilateral SFA disease. He performed rotational atherectomy on the right SFA with balloon angioplasty. Subsequence Dopplers showed occlusion of his right SFA with progression of the disease on the left side. The patient reports no improvement in symptoms after the intervention and thus no further procedures were performed. He did see Dr. Einar Gip as well but now he is switching to me.  He also  has a history of COPD, HTN, chronic atrial fibrillation on Coumadin, dyslipidemia and type 2 DM. he continues to smoke one and a half pack per day since he was 82 years old. He also has chronic kidney disease.  He reports bilateral leg weakness and poor balance with minimal discomfort with walking. He had hip fracture multiple times.  Allergies  Allergen Reactions  . Aspirin Anaphylaxis, Shortness Of Breath and Rash    "broke out in white welts; red blotches neck and face; windpipe closing; ~ 1962"     Current Outpatient Prescriptions on File Prior to Visit  Medication Sig Dispense Refill  . doxazosin (CARDURA) 4 MG tablet Take 4 mg by mouth every evening.     . finasteride (PROSCAR) 5 MG tablet Take 5 mg by mouth daily.     . fluticasone (FLONASE) 50 MCG/ACT nasal spray Place 1 spray into both nostrils daily.    . furosemide (LASIX) 40 MG tablet Take 1 tablet (40 mg total) by mouth 2 (two) times daily. 60 tablet 5  . glimepiride (AMARYL) 2 MG tablet Take 2 mg by mouth daily with breakfast.    . glyBURIDE (DIABETA) 5  MG tablet Take 2.5-10 mg by mouth 2 (two) times daily with a meal. 2 tablets in the morning and 1/2 tablet in the evening    . HYDROcodone-acetaminophen (NORCO/VICODIN) 5-325 MG per tablet Take 1 tablet by mouth every 6 (six) hours as needed for moderate pain. 15 tablet 0  . loratadine (CLARITIN) 10 MG tablet Take 10 mg by mouth daily.    Marland Kitchen PHOSPHATIDYLSERINE PO Take 1 tablet by mouth daily.    . potassium chloride SA (K-DUR,KLOR-CON) 20 MEQ tablet Take 20 mEq by mouth daily.    . simvastatin (ZOCOR) 20 MG tablet Take 20 mg by mouth every evening.    Marland Kitchen Umeclidinium-Vilanterol (ANORO ELLIPTA) 62.5-25 MCG/INH AEPB Inhale 1 puff into the lungs daily.    Marland Kitchen warfarin (COUMADIN) 2 MG tablet Take 2 mg by mouth daily.  5  . warfarin (COUMADIN) 5 MG tablet Take 2.5 mg by mouth every evening.      No current facility-administered medications on file prior to visit.     Past Medical History  Diagnosis Date  . AS (aortic stenosis)     bovine aortic valve replacement 01/07/11  . HTN (hypertension)   . Thrombocytopenia due to drugs     seen by Dr Inda Merlin plts 114000 no rx  . Peripheral vascular disease ([place]) very poor circulation legs and feet ... stents right and left legs... done in dr j. Gwenlyn Found 's office.   . Depression  wife died 4 years ago.    Marland Kitchen COPD (chronic obstructive pulmonary disease) ([place])   . Shortness of breath   . Recurrent upper respiratory infection (URI)     sinusitis  . Claudication in peripheral vascular disease ([place]) 06/17/2011  . S/P angioplasty with stent, diamond back rotational athrectomy Prox. Rt. SFA 06/17/2011 06/17/2011  . Neuropathy due to secondary diabetes ([place])   . Type II diabetes mellitus ([place])   . Pneumonia 07/25/11    left  . Persistent atrial fibrillation ([place])   . Blood transfusion     w/hip operation  . History of stomach ulcers ~ 1951  . Renal artery stenosis ([place]) 2006    renal artery stent  . Bladder cancer [facility])     Bladder Cancer local  . Cellulitis  of left lower extremity   . Chronic diastolic congestive heart failure ([place])   . Normal coronary arteries Sept 2012     Past Surgical History  Procedure Laterality Date  . Peripheral arterial stent graft      2006 left anf right illiac stents Dr Deon Pilling  . Aortic valve replacement  01/07/2011    Procedure: AORTIC VALVE REPLACEMENT (AVR);  Surgeon: Grace Isaac, MD;  Location: [place];  Service: Open Heart Surgery;  Laterality: N/A;; magna-ease bovine 90mm bioprosthesis  . Cardioversion  04/03/2011    Procedure: CARDIOVERSION;  Surgeon: Leonie Man, MD;  Location: [place];  Service: Cardiovascular;  Laterality: N/A;  . Lower extremity angiogram  06/17/2011    diamondback orbital rotational and cutting balloon atherectomy of the prox R SFA  . Femur im nail  06/27/2011    Procedure: INTRAMEDULLARY (IM) NAIL FEMORAL;  Surgeon: Marin Shutter, MD;  [facility];  Service: Orthopedics;  Laterality: Right;  . Tonsillectomy and adenoidectomy      "when I was a kid"  . Cholecystectomy    . Cataract extraction w/ intraocular lens  implant, bilateral  ~ 2007  . Renal artery stent  2006    "I believe"  . Cardiac catheterization  11/19/10    normal coronaries, mod AS, 75% l RAS  . Femur im nail Left 12/14/2013    Procedure: INTRAMEDULLARY (IM) NAIL FEMORAL;  Surgeon: Mauri Pole, MD;  [facility];  Service: Orthopedics;  Laterality: Left;  . Atherectomy N/A 06/17/2011    Procedure: ATHERECTOMY;  Surgeon: Lorretta Harp, MD;  Location: [facility] CATH LAB;  Service: Cardiovascular;  Laterality: N/A;  . Lower extremity angiogram Bilateral 06/17/2011    Procedure: LOWER EXTREMITY ANGIOGRAM;  Surgeon: Lorretta Harp, MD;  [facility] CATH LAB;  Service: Cardiovascular;  Laterality: Bilateral;  . Abdominal angiogram  06/17/2011    Procedure: ABDOMINAL ANGIOGRAM;  Surgeon: Lorretta Harp, MD;  Location: [facility] CATH LAB;  Service: Cardiovascular;;     Family History  Problem Relation Age of Onset   . Heart disease Mother   . Cancer Brother     colon  . Stroke Father      Social History   Social History  . Marital Status: Widowed    Spouse Name: N/A  . Number of Children: 3  . Years of Education: N/A   Occupational History  . sales    Social History Main Topics  . Smoking status: Current Every Day Smoker -- 1.50 packs/day for 75 years    Types: Cigarettes  . Smokeless tobacco: Never Used  . Alcohol Use: No  . Drug Use: No  . Sexual Activity: No  Other Topics Concern  . Not on file   Social History Narrative      PHYSICAL EXAM   BP 122/54 mmHg  Pulse 64  Ht 5\' 11"  (1.803 m)  Wt 179 lb 6.4 oz (81.375 kg)  BMI 25.03 kg/m2  Constitutional: He is oriented to person, place, and time. He appears well-developed and well-nourished. No distress.  HENT: No nasal discharge.  Head: Normocephalic and atraumatic.  Eyes: Pupils are equal and round.  No discharge. Neck: Normal range of motion. Neck supple. No JVD present. No thyromegaly present.  Cardiovascular: Normal rate, regular rhythm, normal heart sounds. Exam reveals no gallop and no friction rub. No murmur heard.  Pulmonary/Chest: Effort normal and breath sounds normal. No stridor. No respiratory distress. He has no wheezes. He has no rales. He exhibits no tenderness.  Abdominal: Soft. Bowel sounds are normal. He exhibits no distension. There is no tenderness. There is no rebound and no guarding.  Musculoskeletal: Normal range of motion. He exhibits no edema and no tenderness.  Neurological: He is alert and oriented to person, place, and time. Coordination normal.  Skin: Skin is warm and dry. No rash noted. He is not diaphoretic. No erythema. No pallor.  Psychiatric: He has a normal mood and affect. His behavior is normal. Judgment and thought content normal.  Vascular: Femoral pulses are normal. Distal pulses are not palpable.     ASSESSMENT AND PLAN

## 2015-03-07 NOTE — Assessment & Plan Note (Signed)
Lab Results  Component Value Date   CHOL 94* 02/10/2015   HDL 43 02/10/2015   LDLCALC 42 02/10/2015   TRIG 44 02/10/2015   CHOLHDL 2.2 02/10/2015   Continue treatment with simvastatin.

## 2015-03-07 NOTE — Assessment & Plan Note (Signed)
The patient has known history of peripheral arterial disease with previous iliac stenting and atherectomy and angioplasty on the right SFA with subsequent occlusion of the right SFA and known severe left SFA disease. His current symptoms include bilateral leg weakness with minimal discomfort. He also reports poor balance. There is probably multiple etiologies for his symptoms and not just claudication. I requested lower extremity arterial Doppler but I think revascularization should be reserved as a last resort especially with his continued smoking, heavy calcifications and advanced age. Continue aggressive treatment of risk factors.

## 2015-03-07 NOTE — Patient Instructions (Signed)
Medication Instructions:  Your physician recommends that you continue on your current medications as directed. Please refer to the Current Medication list given to you today.  Labwork: No new orders.   Testing/Procedures: Your physician has requested that you have a lower extremity arterial duplex. This test is an ultrasound of the arteries in the legs. It looks at arterial blood flow in the legs. Allow one hour for Lower Arterial scans. There are no restrictions or special instructions  Follow-Up: Your physician wants you to follow-up in: 6 MONTHS with Dr Arida.  You will receive a reminder letter in the mail two months in advance. If you don't receive a letter, please call our office to schedule the follow-up appointment.   Any Other Special Instructions Will Be Listed Below (If Applicable).     If you need a refill on your cardiac medications before your next appointment, please call your pharmacy.   

## 2015-03-07 NOTE — Assessment & Plan Note (Signed)
I discussed with him the importance of smoking cessation but he is not able to quit.

## 2015-03-13 ENCOUNTER — Other Ambulatory Visit: Payer: Self-pay | Admitting: Cardiovascular Disease

## 2015-03-13 DIAGNOSIS — I739 Peripheral vascular disease, unspecified: Secondary | ICD-10-CM

## 2015-03-21 ENCOUNTER — Other Ambulatory Visit: Payer: Self-pay | Admitting: Cardiovascular Disease

## 2015-03-21 ENCOUNTER — Ambulatory Visit (HOSPITAL_COMMUNITY)
Admission: RE | Admit: 2015-03-21 | Discharge: 2015-03-21 | Disposition: A | Discharge status: Home / Self Care | Payer: PPO | Source: Ambulatory Visit | Attending: Cardiology | Admitting: Cardiology

## 2015-03-21 DIAGNOSIS — E1140 Type 2 diabetes mellitus with diabetic neuropathy, unspecified: Secondary | ICD-10-CM | POA: Diagnosis not present

## 2015-03-21 DIAGNOSIS — I10 Essential (primary) hypertension: Secondary | ICD-10-CM | POA: Insufficient documentation

## 2015-03-21 DIAGNOSIS — I700 Atherosclerosis of aorta: Secondary | ICD-10-CM | POA: Insufficient documentation

## 2015-03-21 DIAGNOSIS — I708 Atherosclerosis of other arteries: Secondary | ICD-10-CM | POA: Diagnosis not present

## 2015-03-21 DIAGNOSIS — R938 Abnormal findings on diagnostic imaging of other specified body structures: Secondary | ICD-10-CM | POA: Diagnosis not present

## 2015-03-21 DIAGNOSIS — I77819 Aortic ectasia, unspecified site: Secondary | ICD-10-CM | POA: Diagnosis not present

## 2015-03-21 DIAGNOSIS — I739 Peripheral vascular disease, unspecified: Secondary | ICD-10-CM | POA: Insufficient documentation

## 2015-03-23 DIAGNOSIS — I130 Hypertensive heart and chronic kidney disease with heart failure and stage 1 through stage 4 chronic kidney disease, or unspecified chronic kidney disease: Secondary | ICD-10-CM | POA: Diagnosis not present

## 2015-03-23 DIAGNOSIS — I4891 Unspecified atrial fibrillation: Secondary | ICD-10-CM | POA: Diagnosis not present

## 2015-03-23 DIAGNOSIS — Z6825 Body mass index (BMI) 25.0-25.9, adult: Secondary | ICD-10-CM | POA: Diagnosis not present

## 2015-03-23 DIAGNOSIS — Z7901 Long term (current) use of anticoagulants: Secondary | ICD-10-CM | POA: Diagnosis not present

## 2015-03-23 DIAGNOSIS — Z1389 Encounter for screening for other disorder: Secondary | ICD-10-CM | POA: Diagnosis not present

## 2015-03-23 DIAGNOSIS — I2510 Atherosclerotic heart disease of native coronary artery without angina pectoris: Secondary | ICD-10-CM | POA: Diagnosis not present

## 2015-03-23 DIAGNOSIS — N183 Chronic kidney disease, stage 3 (moderate): Secondary | ICD-10-CM | POA: Diagnosis not present

## 2015-03-23 DIAGNOSIS — F3341 Major depressive disorder, recurrent, in partial remission: Secondary | ICD-10-CM | POA: Diagnosis not present

## 2015-03-23 DIAGNOSIS — I10 Essential (primary) hypertension: Secondary | ICD-10-CM | POA: Diagnosis not present

## 2015-03-23 DIAGNOSIS — I509 Heart failure, unspecified: Secondary | ICD-10-CM | POA: Diagnosis not present

## 2015-03-23 DIAGNOSIS — E1151 Type 2 diabetes mellitus with diabetic peripheral angiopathy without gangrene: Secondary | ICD-10-CM | POA: Diagnosis not present

## 2015-03-23 DIAGNOSIS — R627 Adult failure to thrive: Secondary | ICD-10-CM | POA: Diagnosis not present

## 2015-04-06 DIAGNOSIS — D485 Neoplasm of uncertain behavior of skin: Secondary | ICD-10-CM | POA: Diagnosis not present

## 2015-04-06 DIAGNOSIS — Z85828 Personal history of other malignant neoplasm of skin: Secondary | ICD-10-CM | POA: Diagnosis not present

## 2015-04-06 DIAGNOSIS — L905 Scar conditions and fibrosis of skin: Secondary | ICD-10-CM | POA: Diagnosis not present

## 2015-04-06 DIAGNOSIS — L570 Actinic keratosis: Secondary | ICD-10-CM | POA: Diagnosis not present

## 2015-04-18 DIAGNOSIS — Z7901 Long term (current) use of anticoagulants: Secondary | ICD-10-CM | POA: Diagnosis not present

## 2015-04-18 DIAGNOSIS — I4891 Unspecified atrial fibrillation: Secondary | ICD-10-CM | POA: Diagnosis not present

## 2015-05-16 DIAGNOSIS — I4891 Unspecified atrial fibrillation: Secondary | ICD-10-CM | POA: Diagnosis not present

## 2015-05-16 DIAGNOSIS — Z7901 Long term (current) use of anticoagulants: Secondary | ICD-10-CM | POA: Diagnosis not present

## 2015-05-25 DIAGNOSIS — I2510 Atherosclerotic heart disease of native coronary artery without angina pectoris: Secondary | ICD-10-CM | POA: Diagnosis not present

## 2015-05-25 DIAGNOSIS — Z952 Presence of prosthetic heart valve: Secondary | ICD-10-CM | POA: Diagnosis not present

## 2015-05-25 DIAGNOSIS — I4891 Unspecified atrial fibrillation: Secondary | ICD-10-CM | POA: Diagnosis not present

## 2015-05-25 DIAGNOSIS — I872 Venous insufficiency (chronic) (peripheral): Secondary | ICD-10-CM | POA: Diagnosis not present

## 2015-05-25 DIAGNOSIS — R0600 Dyspnea, unspecified: Secondary | ICD-10-CM | POA: Diagnosis not present

## 2015-05-25 DIAGNOSIS — Z7901 Long term (current) use of anticoagulants: Secondary | ICD-10-CM | POA: Diagnosis not present

## 2015-05-25 DIAGNOSIS — Z6825 Body mass index (BMI) 25.0-25.9, adult: Secondary | ICD-10-CM | POA: Diagnosis not present

## 2015-05-25 DIAGNOSIS — E1151 Type 2 diabetes mellitus with diabetic peripheral angiopathy without gangrene: Secondary | ICD-10-CM | POA: Diagnosis not present

## 2015-05-25 DIAGNOSIS — I130 Hypertensive heart and chronic kidney disease with heart failure and stage 1 through stage 4 chronic kidney disease, or unspecified chronic kidney disease: Secondary | ICD-10-CM | POA: Diagnosis not present

## 2015-05-25 DIAGNOSIS — F17210 Nicotine dependence, cigarettes, uncomplicated: Secondary | ICD-10-CM | POA: Diagnosis not present

## 2015-05-25 DIAGNOSIS — N183 Chronic kidney disease, stage 3 (moderate): Secondary | ICD-10-CM | POA: Diagnosis not present

## 2015-05-25 DIAGNOSIS — D6489 Other specified anemias: Secondary | ICD-10-CM | POA: Diagnosis not present

## 2015-05-30 DIAGNOSIS — N183 Chronic kidney disease, stage 3 (moderate): Secondary | ICD-10-CM | POA: Diagnosis not present

## 2015-05-30 DIAGNOSIS — R627 Adult failure to thrive: Secondary | ICD-10-CM | POA: Diagnosis not present

## 2015-05-30 DIAGNOSIS — Z7901 Long term (current) use of anticoagulants: Secondary | ICD-10-CM | POA: Diagnosis not present

## 2015-05-30 DIAGNOSIS — E1151 Type 2 diabetes mellitus with diabetic peripheral angiopathy without gangrene: Secondary | ICD-10-CM | POA: Diagnosis not present

## 2015-05-30 DIAGNOSIS — Z6824 Body mass index (BMI) 24.0-24.9, adult: Secondary | ICD-10-CM | POA: Diagnosis not present

## 2015-05-30 DIAGNOSIS — I130 Hypertensive heart and chronic kidney disease with heart failure and stage 1 through stage 4 chronic kidney disease, or unspecified chronic kidney disease: Secondary | ICD-10-CM | POA: Diagnosis not present

## 2015-06-06 DIAGNOSIS — M545 Low back pain: Secondary | ICD-10-CM | POA: Diagnosis not present

## 2015-06-06 DIAGNOSIS — M549 Dorsalgia, unspecified: Secondary | ICD-10-CM | POA: Diagnosis not present

## 2015-06-06 DIAGNOSIS — D0439 Carcinoma in situ of skin of other parts of face: Secondary | ICD-10-CM | POA: Diagnosis not present

## 2015-06-08 ENCOUNTER — Other Ambulatory Visit: Payer: Self-pay | Admitting: Internal Medicine

## 2015-06-08 DIAGNOSIS — M545 Low back pain: Secondary | ICD-10-CM

## 2015-06-15 ENCOUNTER — Other Ambulatory Visit: Payer: PPO

## 2015-06-15 ENCOUNTER — Ambulatory Visit
Admission: RE | Admit: 2015-06-15 | Discharge: 2015-06-15 | Disposition: A | Discharge status: Home / Self Care | Payer: PPO | Source: Ambulatory Visit | Attending: Internal Medicine | Admitting: Internal Medicine

## 2015-06-15 DIAGNOSIS — M545 Low back pain: Secondary | ICD-10-CM

## 2015-06-23 DIAGNOSIS — Z7901 Long term (current) use of anticoagulants: Secondary | ICD-10-CM | POA: Diagnosis not present

## 2015-06-23 DIAGNOSIS — I4891 Unspecified atrial fibrillation: Secondary | ICD-10-CM | POA: Diagnosis not present

## 2015-06-26 ENCOUNTER — Ambulatory Visit
Admission: RE | Admit: 2015-06-26 | Discharge: 2015-06-26 | Disposition: A | Discharge status: Home / Self Care | Payer: PPO | Source: Ambulatory Visit | Attending: Internal Medicine | Admitting: Internal Medicine

## 2015-06-26 DIAGNOSIS — M4806 Spinal stenosis, lumbar region: Secondary | ICD-10-CM | POA: Diagnosis not present

## 2015-07-07 DIAGNOSIS — I509 Heart failure, unspecified: Secondary | ICD-10-CM | POA: Diagnosis not present

## 2015-07-07 DIAGNOSIS — D6489 Other specified anemias: Secondary | ICD-10-CM | POA: Diagnosis not present

## 2015-07-07 DIAGNOSIS — N183 Chronic kidney disease, stage 3 (moderate): Secondary | ICD-10-CM | POA: Diagnosis not present

## 2015-07-07 DIAGNOSIS — R040 Epistaxis: Secondary | ICD-10-CM | POA: Diagnosis not present

## 2015-07-07 DIAGNOSIS — Z7901 Long term (current) use of anticoagulants: Secondary | ICD-10-CM | POA: Diagnosis not present

## 2015-07-07 DIAGNOSIS — I10 Essential (primary) hypertension: Secondary | ICD-10-CM | POA: Diagnosis not present

## 2015-07-07 DIAGNOSIS — Z6826 Body mass index (BMI) 26.0-26.9, adult: Secondary | ICD-10-CM | POA: Diagnosis not present

## 2015-07-07 DIAGNOSIS — E1151 Type 2 diabetes mellitus with diabetic peripheral angiopathy without gangrene: Secondary | ICD-10-CM | POA: Diagnosis not present

## 2015-07-07 DIAGNOSIS — I4891 Unspecified atrial fibrillation: Secondary | ICD-10-CM | POA: Diagnosis not present

## 2015-07-07 DIAGNOSIS — R2689 Other abnormalities of gait and mobility: Secondary | ICD-10-CM | POA: Diagnosis not present

## 2015-07-07 DIAGNOSIS — J301 Allergic rhinitis due to pollen: Secondary | ICD-10-CM | POA: Diagnosis not present

## 2015-08-04 DIAGNOSIS — Z7901 Long term (current) use of anticoagulants: Secondary | ICD-10-CM | POA: Diagnosis not present

## 2015-08-04 DIAGNOSIS — I4891 Unspecified atrial fibrillation: Secondary | ICD-10-CM | POA: Diagnosis not present

## 2015-08-14 DIAGNOSIS — Z7901 Long term (current) use of anticoagulants: Secondary | ICD-10-CM | POA: Diagnosis not present

## 2015-08-14 DIAGNOSIS — I4891 Unspecified atrial fibrillation: Secondary | ICD-10-CM | POA: Diagnosis not present

## 2015-09-04 DIAGNOSIS — S2249XA Multiple fractures of ribs, unspecified side, initial encounter for closed fracture: Secondary | ICD-10-CM | POA: Diagnosis not present

## 2015-09-04 DIAGNOSIS — R0600 Dyspnea, unspecified: Secondary | ICD-10-CM | POA: Diagnosis not present

## 2015-09-04 DIAGNOSIS — M545 Low back pain: Secondary | ICD-10-CM | POA: Diagnosis not present

## 2015-09-04 DIAGNOSIS — J90 Pleural effusion, not elsewhere classified: Secondary | ICD-10-CM | POA: Diagnosis not present

## 2015-09-04 DIAGNOSIS — S2241XA Multiple fractures of ribs, right side, initial encounter for closed fracture: Secondary | ICD-10-CM | POA: Diagnosis not present

## 2015-09-04 DIAGNOSIS — R079 Chest pain, unspecified: Secondary | ICD-10-CM | POA: Diagnosis not present

## 2015-09-04 DIAGNOSIS — Z7901 Long term (current) use of anticoagulants: Secondary | ICD-10-CM | POA: Diagnosis not present

## 2015-09-04 DIAGNOSIS — I10 Essential (primary) hypertension: Secondary | ICD-10-CM | POA: Diagnosis not present

## 2015-09-04 DIAGNOSIS — I4891 Unspecified atrial fibrillation: Secondary | ICD-10-CM | POA: Diagnosis not present

## 2015-09-04 DIAGNOSIS — M4854XA Collapsed vertebra, not elsewhere classified, thoracic region, initial encounter for fracture: Secondary | ICD-10-CM | POA: Diagnosis not present

## 2015-09-04 DIAGNOSIS — W0110XA Fall on same level from slipping, tripping and stumbling with subsequent striking against unspecified object, initial encounter: Secondary | ICD-10-CM | POA: Diagnosis not present

## 2015-09-04 DIAGNOSIS — I517 Cardiomegaly: Secondary | ICD-10-CM | POA: Diagnosis not present

## 2015-09-04 DIAGNOSIS — Z952 Presence of prosthetic heart valve: Secondary | ICD-10-CM | POA: Diagnosis not present

## 2015-09-22 DIAGNOSIS — Z6825 Body mass index (BMI) 25.0-25.9, adult: Secondary | ICD-10-CM | POA: Diagnosis not present

## 2015-09-22 DIAGNOSIS — D692 Other nonthrombocytopenic purpura: Secondary | ICD-10-CM | POA: Diagnosis not present

## 2015-09-22 DIAGNOSIS — I272 Other secondary pulmonary hypertension: Secondary | ICD-10-CM | POA: Diagnosis not present

## 2015-09-22 DIAGNOSIS — N183 Chronic kidney disease, stage 3 (moderate): Secondary | ICD-10-CM | POA: Diagnosis not present

## 2015-09-22 DIAGNOSIS — D6489 Other specified anemias: Secondary | ICD-10-CM | POA: Diagnosis not present

## 2015-09-22 DIAGNOSIS — R627 Adult failure to thrive: Secondary | ICD-10-CM | POA: Diagnosis not present

## 2015-09-22 DIAGNOSIS — F3341 Major depressive disorder, recurrent, in partial remission: Secondary | ICD-10-CM | POA: Diagnosis not present

## 2015-09-22 DIAGNOSIS — R0781 Pleurodynia: Secondary | ICD-10-CM | POA: Diagnosis not present

## 2015-09-22 DIAGNOSIS — I700 Atherosclerosis of aorta: Secondary | ICD-10-CM | POA: Diagnosis not present

## 2015-09-22 DIAGNOSIS — I130 Hypertensive heart and chronic kidney disease with heart failure and stage 1 through stage 4 chronic kidney disease, or unspecified chronic kidney disease: Secondary | ICD-10-CM | POA: Diagnosis not present

## 2015-09-22 DIAGNOSIS — Z7901 Long term (current) use of anticoagulants: Secondary | ICD-10-CM | POA: Diagnosis not present

## 2015-09-22 DIAGNOSIS — E1151 Type 2 diabetes mellitus with diabetic peripheral angiopathy without gangrene: Secondary | ICD-10-CM | POA: Diagnosis not present

## 2015-10-04 ENCOUNTER — Encounter: Payer: Self-pay | Admitting: Cardiology

## 2015-10-05 DIAGNOSIS — C44622 Squamous cell carcinoma of skin of right upper limb, including shoulder: Secondary | ICD-10-CM | POA: Diagnosis not present

## 2015-10-13 DIAGNOSIS — Z23 Encounter for immunization: Secondary | ICD-10-CM | POA: Diagnosis not present

## 2015-10-13 DIAGNOSIS — I4891 Unspecified atrial fibrillation: Secondary | ICD-10-CM | POA: Diagnosis not present

## 2015-10-13 DIAGNOSIS — Z7901 Long term (current) use of anticoagulants: Secondary | ICD-10-CM | POA: Diagnosis not present

## 2015-10-17 NOTE — Progress Notes (Signed)
Cardiology Office Note    Date:  10/18/2015   ID:  Tomas, Schamp January 05, 1923, MRN [id number]  PCP:  Precious Reel, MD  Cardiologist:  [name], MD   Chief Complaint  Patient presents with  . Aortic Stenosis  . Hypertension  . Atrial Fibrillation    History of Present Illness:  Ronald [name] is a 82 y.o. male who with a history of aortic stenosis s/p bovine AVR in 2012 with normal coronary arteries at that time and normal LVF.  He has a history of right renal artery stenosis and LE PVD and has had stenting of the Right renal artery and both iliac arteries followed by Dr. Fletcher Anon.  He has a history of COPD, HTN, chronic atrial fibrillation on Coumadin, dyslipidemia and type 2 DM.  He is doing well today.  He denies any chest pain, SOB, DOE, LE edema, dizziness, palpitations or syncope.  He denies any PND or orthopnea.  He does have nasal congestion in the am.  He continues to smoke 1.5 ppd.    Past Medical History:  Diagnosis Date  . AS (aortic stenosis)    bovine aortic valve replacement 01/07/11  . Bladder cancer [facility])    Bladder Cancer local  . Blood transfusion    w/hip operation  . Cellulitis of left lower extremity   . Chronic atrial fibrillation ([place])   . Chronic diastolic congestive heart failure ([place])   . Claudication in peripheral vascular disease ([place]) 06/17/2011  . COPD (chronic obstructive pulmonary disease) ([name])   . Depression wife died 4 years ago.    Marland Kitchen History of stomach ulcers ~ 1951  . HTN (hypertension)   . Neuropathy due to secondary diabetes ([place])   . Normal coronary arteries Sept 2012  . Peripheral vascular disease ([place]) very poor circulation legs and feet ... stents right and left legs... done in dr j. Gwenlyn Found 's office.   . Pneumonia 07/25/11   left  . Recurrent upper respiratory infection (URI)    sinusitis  . Renal artery stenosis ([place]) 2006   renal artery stent  . S/P angioplasty with stent, diamond back rotational athrectomy Prox. Rt. SFA  06/17/2011 06/17/2011  . Shortness of breath   . Thrombocytopenia due to drugs    seen by Dr Inda Merlin plts [id number] no rx  . Type II diabetes mellitus ([place])     Past Surgical History:  Procedure Laterality Date  . ABDOMINAL ANGIOGRAM  06/17/2011   Procedure: ABDOMINAL ANGIOGRAM;  Surgeon: Lorretta Harp, MD;  Location: [facility] C[facility] LAB;  Service: Cardiovascular;;  . AORTIC VALVE REPLACEMENT  01/07/2011   Procedure: AORTIC VALVE REPLACEMENT (AVR);  Surgeon: Grace Isaac, MD;  Location: [place];  Service: Open Heart Surgery;  Laterality: N/A;; magna-ease bovine 90m bioprosthesis  . ATHERECTOMY N/A 06/17/2011   Procedure: ATHERECTOMY;  Surgeon: JLorretta Harp MD;  [facility];  Service: Cardiovascular;  Laterality: N/A;  . CARDIAC CATHETERIZATION  11/19/10   normal coronaries, mod AS, 75% l RAS  . CARDIOVERSION  04/03/2011   Procedure: CARDIOVERSION;  Surgeon: DLeonie Man MD;  [place]  Service: Cardiovascular;  Laterality: N/A;  . CATARACT EXTRACTION W/ INTRAOCULAR LENS  IMPLANT, BILATERAL  ~ 2007  . CHOLECYSTECTOMY    . FEMUR IM NAIL  06/27/2011   Procedure: INTRAMEDULLARY (IM) NAIL FEMORAL;  Surgeon: KMarin Shutter MD;  [facility];  Service: Orthopedics;  Laterality: Right;  . FEMUR IM NAIL Left 12/14/2013  Procedure: INTRAMEDULLARY (IM) NAIL FEMORAL;  Surgeon: Mauri Pole, MD;  [facility];  Service: Orthopedics;  Laterality: Left;  . LOWER EXTREMITY ANGIOGRAM  06/17/2011   diamondback orbital rotational and cutting balloon atherectomy of the prox R SFA  . LOWER EXTREMITY ANGIOGRAM Bilateral 06/17/2011   Procedure: LOWER EXTREMITY ANGIOGRAM;  Surgeon: Lorretta Harp, MD;  Location: [facility] CATH LAB;  Service: Cardiovascular;  Laterality: Bilateral;  . PERIPHERAL ARTERIAL STENT GRAFT     2006 left anf right illiac stents Dr Deon Pilling  . RENAL ARTERY STENT  2006   "I believe"  . TONSILLECTOMY AND ADENOIDECTOMY     "when I was a kid"    Current  Medications: Outpatient Medications Prior to Visit  Medication Sig Dispense Refill  . alendronate (FOSAMAX) 70 MG tablet Take 70 mg by mouth every 14 (fourteen) days. Take 1 tablet every other Sunday with a full glass of water on an empty stomach.    Marland Kitchen buPROPion (WELLBUTRIN XL) 150 MG 24 hr tablet Take 150 mg by mouth daily.    Marland Kitchen doxazosin (CARDURA) 4 MG tablet Take 4 mg by mouth every evening.     . finasteride (PROSCAR) 5 MG tablet Take 5 mg by mouth daily.     . fluticasone (FLONASE) 50 MCG/ACT nasal spray Place 1 spray into both nostrils daily.    . furosemide (LASIX) 40 MG tablet Take 1 tablet (40 mg total) by mouth 2 (two) times daily. 60 tablet 5  . glimepiride (AMARYL) 2 MG tablet Take 2 mg by mouth daily with breakfast.    . glyBURIDE (DIABETA) 5 MG tablet Take 2.5-10 mg by mouth 2 (two) times daily with a meal. 2 tablets in the morning and 1/2 tablet in the evening    . HYDROcodone-acetaminophen (NORCO/VICODIN) 5-325 MG per tablet Take 1 tablet by mouth every 6 (six) hours as needed for moderate pain. 15 tablet 0  . Insulin Glargine (LANTUS) 100 UNIT/ML Solostar Pen Inject 24 Units into the skin daily at 10 pm.    . loratadine (CLARITIN) 10 MG tablet Take 10 mg by mouth daily.    . metFORMIN (GLUCOPHAGE) 500 MG tablet Take 500 mg by mouth daily.    . metolazone (ZAROXOLYN) 2.5 MG tablet Take 2.5 mg by mouth once a week. Take 2.5-5 mg once a week for weight.    . metoprolol succinate (TOPROL-XL) 50 MG 24 hr tablet Take 50 mg by mouth 2 (two) times daily. Take with or immediately following a meal.    . PHOSPHATIDYLSERINE PO Take 1 tablet by mouth daily.    . potassium chloride SA (K-DUR,KLOR-CON) 20 MEQ tablet Take 20 mEq by mouth daily.    . Salicylic Acid-Cleanser 6 % (Cream) KIT Apply topically. Used as directed    . simvastatin (ZOCOR) 20 MG tablet Take 20 mg by mouth every evening.    . traMADol (ULTRAM) 50 MG tablet Take 50 mg by mouth 2 (two) times daily.    Marland Kitchen  Umeclidinium-Vilanterol (ANORO ELLIPTA) 62.5-25 MCG/INH AEPB Inhale 1 puff into the lungs daily.    Marland Kitchen warfarin (COUMADIN) 2 MG tablet Take 2 mg by mouth daily.  5  . warfarin (COUMADIN) 5 MG tablet Take 2.5 mg by mouth every evening.      No facility-administered medications prior to visit.      Allergies:   Aspirin   Social History   Social History  . Marital status: Widowed    Spouse name: N/A  . Number  of children: 3  . Years of education: N/A   Occupational History  . sales    Social History Main Topics  . Smoking status: Current Every Day Smoker    Packs/day: 1.50    Years: 75.00    Types: Cigarettes  . Smokeless tobacco: Never Used  . Alcohol use No  . Drug use: No  . Sexual activity: No   Other Topics Concern  . None   Social History Narrative  . None     Family History:  The patient's family history includes Cancer in his brother; Heart disease in his mother; Stroke in his father.   ROS:   Please see the history of present illness.    ROS All other systems reviewed and are negative.   PHYSICAL EXAM:   VS:  BP (!) 122/46   Pulse 63   Ht _0  (1.803 m)   Wt 181 lb (82.1 kg)   BMI 25.24 kg/m    GEN: Well nourished, well developed, in no acute distress  HEENT: normal  Neck: no JVD.  Left  carotid bruits Cardiac: irregularly irregular ; no murmurs, rubs, or gallops.  1+edema.  Intact distal pulses bilaterally.  Respiratory:  clear to auscultation bilaterally, normal work of breathing GI: soft, nontender, nondistended, + BS MS: no deformity or atrophy  Skin: warm and dry, no rash Neuro:  Alert and Oriented x 3, Strength and sensation are intact Psych: euthymic mood, full affect  Wt Readings from Last 3 Encounters:  10/18/15 181 lb (82.1 kg)  03/07/15 179 lb 6.4 oz (81.4 kg)  02/03/15 176 lb 1.9 oz (79.9 kg)      Studies/Labs Reviewed:   EKG:  EKG is  ordered today and showed atrial fibrillation with HR 63bpm with LAFB, septal infarct and  nonspecific IVCD  Recent Labs: 11/01/2014: Hemoglobin 11.5; Platelets 95 02/10/2015: Brain Natriuretic Peptide 188.8; BUN 32; Creat 1.44; Potassium 4.2; Sodium 141   Lipid Panel    Component Value Date/Time   CHOL 94 (L) 02/10/2015 0950   TRIG 44 02/10/2015 0950   HDL 43 02/10/2015 0950   CHOLHDL 2.2 02/10/2015 0950   VLDL 9 02/10/2015 0950   LDLCALC 42 02/10/2015 0950    Additional studies/ records that were reviewed today include:  none    ASSESSMENT:    1. AS (aortic stenosis)- s/p tissue AVR 01/07/11   2. Chronic diastolic heart failure ([place])   3. Essential hypertension   4. Chronic atrial fibrillation (HCC)      PLAN:  In order of problems listed above:  1. Aortic stenosis s/p bovine tissue AVR (normal coronary arteries at time of cath 2012) - stable with no complaints. 2. Chronic diastolic CHF -Weight is up slightly (2lbs) from last OV and he has increased LE edema.  He has had to take a few extra lasix recently.  I have recommended that he increase Lasix to 30m TID for 2 days then back to BID.   Continue Zaroxolyn once weekly and ss Lasix in addition if weight goes up.  He will let me know if his weight does not come down on the extra lasix for 2 days.  3. HTN - BP controlled on current meds.  Continue Cardura/BB 4. Chronic atrial fibrillation - rate controlled.  Continue BB and warfarin.  5. PVD with renal artery and bilateral iliac disease s/p right renal artery stent, iliac stenting and rotational atherectomy on right SFA with balloon angioplasty. followed by Dr. AFletcher Anon  Continue statin.  6. Carotid artery stenosis with left carotid bruit - conservative management given advanced age.      Medication Adjustments/Labs and Tests Ordered: Current medicines are reviewed at length with the patient today.  Concerns regarding medicines are outlined above.  Medication changes, Labs and Tests ordered today are listed in the Patient Instructions below.  There are no Patient  Instructions on file for this visit.   Signed, [name], MD  10/18/2015 11:32 AM    [facility] [address], [place], [place]  [address] Phone: [phone]; Fax: [phone]

## 2015-10-18 ENCOUNTER — Encounter: Payer: Self-pay | Admitting: Cardiology

## 2015-10-18 ENCOUNTER — Ambulatory Visit (INDEPENDENT_AMBULATORY_CARE_PROVIDER_SITE_OTHER): Payer: PPO | Admitting: Cardiology

## 2015-10-18 VITALS — BP 122/46 | HR 63 | Ht 71.0 in | Wt 181.0 lb

## 2015-10-18 DIAGNOSIS — I10 Essential (primary) hypertension: Secondary | ICD-10-CM | POA: Diagnosis not present

## 2015-10-18 DIAGNOSIS — I350 Nonrheumatic aortic (valve) stenosis: Secondary | ICD-10-CM

## 2015-10-18 DIAGNOSIS — I482 Chronic atrial fibrillation, unspecified: Secondary | ICD-10-CM

## 2015-10-18 DIAGNOSIS — I5032 Chronic diastolic (congestive) heart failure: Secondary | ICD-10-CM

## 2015-10-18 LAB — BASIC METABOLIC PANEL
BUN: 23 mg/dL (ref 7–25)
CALCIUM: 8.8 mg/dL (ref 8.6–10.3)
CHLORIDE: 105 mmol/L (ref 98–110)
CO2: 27 mmol/L (ref 20–31)
CREATININE: 1.5 mg/dL — AB (ref 0.70–1.11)
Glucose, Bld: 239 mg/dL — ABNORMAL HIGH (ref 65–99)
Potassium: 4.2 mmol/L (ref 3.5–5.3)
SODIUM: 141 mmol/L (ref 135–146)

## 2015-10-18 MED ORDER — FUROSEMIDE 40 MG PO TABS
40.0000 mg | ORAL_TABLET | Freq: Two times a day (BID) | ORAL | 11 refills | Status: DC
Start: 2015-10-18 — End: 2016-07-04

## 2015-10-18 NOTE — Patient Instructions (Signed)
Medication Instructions:  1) INCREASE LASIX to 40 mg THREE TIMES DAILY for 2 days then resume 40 mg twice daily  Labwork: TODAY: BMET  Testing/Procedures: None  Follow-Up: Your physician wants you to follow-up in: 6 months with Dr. Radford Pax. You will receive a reminder letter in the mail two months in advance. If you don't receive a letter, please call our office to schedule the follow-up appointment.   Any Other Special Instructions Will Be Listed Below (If Applicable).     If you need a refill on your cardiac medications before your next appointment, please call your pharmacy.

## 2015-11-06 DIAGNOSIS — Z7901 Long term (current) use of anticoagulants: Secondary | ICD-10-CM | POA: Diagnosis not present

## 2015-11-06 DIAGNOSIS — Z794 Long term (current) use of insulin: Secondary | ICD-10-CM | POA: Diagnosis not present

## 2015-11-06 DIAGNOSIS — Z6825 Body mass index (BMI) 25.0-25.9, adult: Secondary | ICD-10-CM | POA: Diagnosis not present

## 2015-11-06 DIAGNOSIS — N183 Chronic kidney disease, stage 3 (moderate): Secondary | ICD-10-CM | POA: Diagnosis not present

## 2015-11-06 DIAGNOSIS — E1151 Type 2 diabetes mellitus with diabetic peripheral angiopathy without gangrene: Secondary | ICD-10-CM | POA: Diagnosis not present

## 2015-11-06 DIAGNOSIS — I130 Hypertensive heart and chronic kidney disease with heart failure and stage 1 through stage 4 chronic kidney disease, or unspecified chronic kidney disease: Secondary | ICD-10-CM | POA: Diagnosis not present

## 2015-11-06 DIAGNOSIS — I509 Heart failure, unspecified: Secondary | ICD-10-CM | POA: Diagnosis not present

## 2015-11-06 DIAGNOSIS — D692 Other nonthrombocytopenic purpura: Secondary | ICD-10-CM | POA: Diagnosis not present

## 2015-11-06 DIAGNOSIS — R627 Adult failure to thrive: Secondary | ICD-10-CM | POA: Diagnosis not present

## 2015-11-13 DIAGNOSIS — I4891 Unspecified atrial fibrillation: Secondary | ICD-10-CM | POA: Diagnosis not present

## 2015-11-13 DIAGNOSIS — Z7901 Long term (current) use of anticoagulants: Secondary | ICD-10-CM | POA: Diagnosis not present

## 2015-11-23 DIAGNOSIS — Z7901 Long term (current) use of anticoagulants: Secondary | ICD-10-CM | POA: Diagnosis not present

## 2015-11-23 DIAGNOSIS — L0889 Other specified local infections of the skin and subcutaneous tissue: Secondary | ICD-10-CM | POA: Diagnosis not present

## 2015-12-05 DIAGNOSIS — L0889 Other specified local infections of the skin and subcutaneous tissue: Secondary | ICD-10-CM | POA: Diagnosis not present

## 2015-12-05 DIAGNOSIS — Z7901 Long term (current) use of anticoagulants: Secondary | ICD-10-CM | POA: Diagnosis not present

## 2015-12-05 DIAGNOSIS — Z8551 Personal history of malignant neoplasm of bladder: Secondary | ICD-10-CM | POA: Diagnosis not present

## 2015-12-05 DIAGNOSIS — I4891 Unspecified atrial fibrillation: Secondary | ICD-10-CM | POA: Diagnosis not present

## 2015-12-14 DIAGNOSIS — L0889 Other specified local infections of the skin and subcutaneous tissue: Secondary | ICD-10-CM | POA: Diagnosis not present

## 2015-12-14 DIAGNOSIS — I4891 Unspecified atrial fibrillation: Secondary | ICD-10-CM | POA: Diagnosis not present

## 2015-12-14 DIAGNOSIS — Z6825 Body mass index (BMI) 25.0-25.9, adult: Secondary | ICD-10-CM | POA: Diagnosis not present

## 2015-12-14 DIAGNOSIS — Z7901 Long term (current) use of anticoagulants: Secondary | ICD-10-CM | POA: Diagnosis not present

## 2015-12-21 DIAGNOSIS — I4891 Unspecified atrial fibrillation: Secondary | ICD-10-CM | POA: Diagnosis not present

## 2015-12-21 DIAGNOSIS — L0889 Other specified local infections of the skin and subcutaneous tissue: Secondary | ICD-10-CM | POA: Diagnosis not present

## 2015-12-21 DIAGNOSIS — Z7901 Long term (current) use of anticoagulants: Secondary | ICD-10-CM | POA: Diagnosis not present

## 2015-12-22 DIAGNOSIS — I6523 Occlusion and stenosis of bilateral carotid arteries: Secondary | ICD-10-CM | POA: Diagnosis not present

## 2015-12-22 DIAGNOSIS — Z952 Presence of prosthetic heart valve: Secondary | ICD-10-CM | POA: Diagnosis not present

## 2015-12-22 DIAGNOSIS — I482 Chronic atrial fibrillation: Secondary | ICD-10-CM | POA: Diagnosis not present

## 2015-12-22 DIAGNOSIS — I714 Abdominal aortic aneurysm, without rupture: Secondary | ICD-10-CM | POA: Diagnosis not present

## 2015-12-28 DIAGNOSIS — C679 Malignant neoplasm of bladder, unspecified: Secondary | ICD-10-CM | POA: Diagnosis not present

## 2015-12-28 DIAGNOSIS — J449 Chronic obstructive pulmonary disease, unspecified: Secondary | ICD-10-CM | POA: Diagnosis not present

## 2015-12-28 DIAGNOSIS — Z794 Long term (current) use of insulin: Secondary | ICD-10-CM | POA: Diagnosis not present

## 2015-12-28 DIAGNOSIS — L0889 Other specified local infections of the skin and subcutaneous tissue: Secondary | ICD-10-CM | POA: Diagnosis not present

## 2015-12-28 DIAGNOSIS — D6489 Other specified anemias: Secondary | ICD-10-CM | POA: Diagnosis not present

## 2015-12-28 DIAGNOSIS — R627 Adult failure to thrive: Secondary | ICD-10-CM | POA: Diagnosis not present

## 2015-12-28 DIAGNOSIS — Z7901 Long term (current) use of anticoagulants: Secondary | ICD-10-CM | POA: Diagnosis not present

## 2015-12-28 DIAGNOSIS — I2510 Atherosclerotic heart disease of native coronary artery without angina pectoris: Secondary | ICD-10-CM | POA: Diagnosis not present

## 2015-12-28 DIAGNOSIS — I130 Hypertensive heart and chronic kidney disease with heart failure and stage 1 through stage 4 chronic kidney disease, or unspecified chronic kidney disease: Secondary | ICD-10-CM | POA: Diagnosis not present

## 2015-12-28 DIAGNOSIS — Z6825 Body mass index (BMI) 25.0-25.9, adult: Secondary | ICD-10-CM | POA: Diagnosis not present

## 2015-12-28 DIAGNOSIS — E1151 Type 2 diabetes mellitus with diabetic peripheral angiopathy without gangrene: Secondary | ICD-10-CM | POA: Diagnosis not present

## 2015-12-29 ENCOUNTER — Encounter (HOSPITAL_BASED_OUTPATIENT_CLINIC_OR_DEPARTMENT_OTHER): Payer: PPO | Attending: Internal Medicine

## 2016-01-02 DIAGNOSIS — L0889 Other specified local infections of the skin and subcutaneous tissue: Secondary | ICD-10-CM | POA: Diagnosis not present

## 2016-01-02 DIAGNOSIS — Z7901 Long term (current) use of anticoagulants: Secondary | ICD-10-CM | POA: Diagnosis not present

## 2016-01-02 DIAGNOSIS — I4891 Unspecified atrial fibrillation: Secondary | ICD-10-CM | POA: Diagnosis not present

## 2016-01-25 DIAGNOSIS — I4891 Unspecified atrial fibrillation: Secondary | ICD-10-CM | POA: Diagnosis not present

## 2016-01-25 DIAGNOSIS — Z7901 Long term (current) use of anticoagulants: Secondary | ICD-10-CM | POA: Diagnosis not present

## 2016-01-26 DIAGNOSIS — I10 Essential (primary) hypertension: Secondary | ICD-10-CM | POA: Diagnosis not present

## 2016-01-26 DIAGNOSIS — I482 Chronic atrial fibrillation: Secondary | ICD-10-CM | POA: Diagnosis not present

## 2016-01-26 DIAGNOSIS — I739 Peripheral vascular disease, unspecified: Secondary | ICD-10-CM | POA: Diagnosis not present

## 2016-01-26 DIAGNOSIS — I6523 Occlusion and stenosis of bilateral carotid arteries: Secondary | ICD-10-CM | POA: Diagnosis not present

## 2016-01-29 ENCOUNTER — Encounter (HOSPITAL_BASED_OUTPATIENT_CLINIC_OR_DEPARTMENT_OTHER): Payer: PPO | Attending: Internal Medicine

## 2016-01-29 DIAGNOSIS — E1151 Type 2 diabetes mellitus with diabetic peripheral angiopathy without gangrene: Secondary | ICD-10-CM | POA: Diagnosis not present

## 2016-01-29 DIAGNOSIS — S81812A Laceration without foreign body, left lower leg, initial encounter: Secondary | ICD-10-CM | POA: Diagnosis not present

## 2016-01-29 DIAGNOSIS — E1142 Type 2 diabetes mellitus with diabetic polyneuropathy: Secondary | ICD-10-CM | POA: Diagnosis not present

## 2016-01-29 DIAGNOSIS — I70248 Atherosclerosis of native arteries of left leg with ulceration of other part of lower left leg: Secondary | ICD-10-CM | POA: Diagnosis not present

## 2016-01-29 DIAGNOSIS — E1140 Type 2 diabetes mellitus with diabetic neuropathy, unspecified: Secondary | ICD-10-CM | POA: Diagnosis not present

## 2016-01-29 DIAGNOSIS — I482 Chronic atrial fibrillation: Secondary | ICD-10-CM | POA: Diagnosis not present

## 2016-01-29 DIAGNOSIS — I110 Hypertensive heart disease with heart failure: Secondary | ICD-10-CM | POA: Diagnosis not present

## 2016-01-29 DIAGNOSIS — I5032 Chronic diastolic (congestive) heart failure: Secondary | ICD-10-CM | POA: Diagnosis not present

## 2016-01-29 DIAGNOSIS — L97821 Non-pressure chronic ulcer of other part of left lower leg limited to breakdown of skin: Secondary | ICD-10-CM | POA: Insufficient documentation

## 2016-01-29 DIAGNOSIS — Z7901 Long term (current) use of anticoagulants: Secondary | ICD-10-CM | POA: Diagnosis not present

## 2016-01-29 DIAGNOSIS — S81802A Unspecified open wound, left lower leg, initial encounter: Secondary | ICD-10-CM | POA: Diagnosis not present

## 2016-01-29 DIAGNOSIS — I872 Venous insufficiency (chronic) (peripheral): Secondary | ICD-10-CM | POA: Diagnosis not present

## 2016-01-29 DIAGNOSIS — F17210 Nicotine dependence, cigarettes, uncomplicated: Secondary | ICD-10-CM | POA: Insufficient documentation

## 2016-02-05 DIAGNOSIS — I70242 Atherosclerosis of native arteries of left leg with ulceration of calf: Secondary | ICD-10-CM | POA: Diagnosis not present

## 2016-02-05 DIAGNOSIS — L97821 Non-pressure chronic ulcer of other part of left lower leg limited to breakdown of skin: Secondary | ICD-10-CM | POA: Diagnosis not present

## 2016-02-05 DIAGNOSIS — I70248 Atherosclerosis of native arteries of left leg with ulceration of other part of lower left leg: Secondary | ICD-10-CM | POA: Diagnosis not present

## 2016-02-05 DIAGNOSIS — L820 Inflamed seborrheic keratosis: Secondary | ICD-10-CM | POA: Diagnosis not present

## 2016-02-05 DIAGNOSIS — D1801 Hemangioma of skin and subcutaneous tissue: Secondary | ICD-10-CM | POA: Diagnosis not present

## 2016-02-06 DIAGNOSIS — S81802A Unspecified open wound, left lower leg, initial encounter: Secondary | ICD-10-CM | POA: Diagnosis not present

## 2016-02-12 DIAGNOSIS — I70248 Atherosclerosis of native arteries of left leg with ulceration of other part of lower left leg: Secondary | ICD-10-CM | POA: Diagnosis not present

## 2016-02-12 DIAGNOSIS — L97821 Non-pressure chronic ulcer of other part of left lower leg limited to breakdown of skin: Secondary | ICD-10-CM | POA: Diagnosis not present

## 2016-02-12 DIAGNOSIS — I70242 Atherosclerosis of native arteries of left leg with ulceration of calf: Secondary | ICD-10-CM | POA: Diagnosis not present

## 2016-02-15 DIAGNOSIS — Z7901 Long term (current) use of anticoagulants: Secondary | ICD-10-CM | POA: Diagnosis not present

## 2016-02-23 DIAGNOSIS — I6523 Occlusion and stenosis of bilateral carotid arteries: Secondary | ICD-10-CM | POA: Diagnosis not present

## 2016-02-23 DIAGNOSIS — I739 Peripheral vascular disease, unspecified: Secondary | ICD-10-CM | POA: Diagnosis not present

## 2016-02-23 DIAGNOSIS — I482 Chronic atrial fibrillation: Secondary | ICD-10-CM | POA: Diagnosis not present

## 2016-02-23 DIAGNOSIS — Z952 Presence of prosthetic heart valve: Secondary | ICD-10-CM | POA: Diagnosis not present

## 2016-02-29 ENCOUNTER — Encounter (HOSPITAL_BASED_OUTPATIENT_CLINIC_OR_DEPARTMENT_OTHER): Payer: PPO | Attending: Internal Medicine

## 2016-02-29 DIAGNOSIS — E1140 Type 2 diabetes mellitus with diabetic neuropathy, unspecified: Secondary | ICD-10-CM | POA: Insufficient documentation

## 2016-02-29 DIAGNOSIS — S81002A Unspecified open wound, left knee, initial encounter: Secondary | ICD-10-CM | POA: Insufficient documentation

## 2016-02-29 DIAGNOSIS — J449 Chronic obstructive pulmonary disease, unspecified: Secondary | ICD-10-CM | POA: Insufficient documentation

## 2016-02-29 DIAGNOSIS — Z7901 Long term (current) use of anticoagulants: Secondary | ICD-10-CM | POA: Diagnosis not present

## 2016-02-29 DIAGNOSIS — E1151 Type 2 diabetes mellitus with diabetic peripheral angiopathy without gangrene: Secondary | ICD-10-CM | POA: Insufficient documentation

## 2016-02-29 DIAGNOSIS — I509 Heart failure, unspecified: Secondary | ICD-10-CM | POA: Insufficient documentation

## 2016-02-29 DIAGNOSIS — I70248 Atherosclerosis of native arteries of left leg with ulceration of other part of lower left leg: Secondary | ICD-10-CM | POA: Insufficient documentation

## 2016-02-29 DIAGNOSIS — W19XXXA Unspecified fall, initial encounter: Secondary | ICD-10-CM | POA: Diagnosis not present

## 2016-02-29 DIAGNOSIS — I110 Hypertensive heart disease with heart failure: Secondary | ICD-10-CM | POA: Diagnosis not present

## 2016-02-29 DIAGNOSIS — I4891 Unspecified atrial fibrillation: Secondary | ICD-10-CM | POA: Diagnosis not present

## 2016-02-29 DIAGNOSIS — S80212A Abrasion, left knee, initial encounter: Secondary | ICD-10-CM | POA: Diagnosis not present

## 2016-02-29 DIAGNOSIS — L97821 Non-pressure chronic ulcer of other part of left lower leg limited to breakdown of skin: Secondary | ICD-10-CM | POA: Insufficient documentation

## 2016-02-29 DIAGNOSIS — S81802A Unspecified open wound, left lower leg, initial encounter: Secondary | ICD-10-CM | POA: Diagnosis not present

## 2016-02-29 DIAGNOSIS — I70242 Atherosclerosis of native arteries of left leg with ulceration of calf: Secondary | ICD-10-CM | POA: Diagnosis not present

## 2016-03-07 DIAGNOSIS — I509 Heart failure, unspecified: Secondary | ICD-10-CM | POA: Diagnosis not present

## 2016-03-07 DIAGNOSIS — Z794 Long term (current) use of insulin: Secondary | ICD-10-CM | POA: Diagnosis not present

## 2016-03-07 DIAGNOSIS — Z6825 Body mass index (BMI) 25.0-25.9, adult: Secondary | ICD-10-CM | POA: Diagnosis not present

## 2016-03-07 DIAGNOSIS — R627 Adult failure to thrive: Secondary | ICD-10-CM | POA: Diagnosis not present

## 2016-03-07 DIAGNOSIS — Z5189 Encounter for other specified aftercare: Secondary | ICD-10-CM | POA: Diagnosis not present

## 2016-03-07 DIAGNOSIS — Z7901 Long term (current) use of anticoagulants: Secondary | ICD-10-CM | POA: Diagnosis not present

## 2016-03-07 DIAGNOSIS — E1151 Type 2 diabetes mellitus with diabetic peripheral angiopathy without gangrene: Secondary | ICD-10-CM | POA: Diagnosis not present

## 2016-03-07 DIAGNOSIS — I130 Hypertensive heart and chronic kidney disease with heart failure and stage 1 through stage 4 chronic kidney disease, or unspecified chronic kidney disease: Secondary | ICD-10-CM | POA: Diagnosis not present

## 2016-03-07 DIAGNOSIS — D698 Other specified hemorrhagic conditions: Secondary | ICD-10-CM | POA: Diagnosis not present

## 2016-03-07 DIAGNOSIS — I2789 Other specified pulmonary heart diseases: Secondary | ICD-10-CM | POA: Diagnosis not present

## 2016-03-07 DIAGNOSIS — D649 Anemia, unspecified: Secondary | ICD-10-CM | POA: Diagnosis not present

## 2016-03-07 DIAGNOSIS — I4891 Unspecified atrial fibrillation: Secondary | ICD-10-CM | POA: Diagnosis not present

## 2016-03-15 DIAGNOSIS — I70248 Atherosclerosis of native arteries of left leg with ulceration of other part of lower left leg: Secondary | ICD-10-CM | POA: Diagnosis not present

## 2016-03-15 DIAGNOSIS — S81802A Unspecified open wound, left lower leg, initial encounter: Secondary | ICD-10-CM | POA: Diagnosis not present

## 2016-03-15 DIAGNOSIS — I70242 Atherosclerosis of native arteries of left leg with ulceration of calf: Secondary | ICD-10-CM | POA: Diagnosis not present

## 2016-03-15 DIAGNOSIS — S81812D Laceration without foreign body, left lower leg, subsequent encounter: Secondary | ICD-10-CM | POA: Diagnosis not present

## 2016-03-21 DIAGNOSIS — Z7901 Long term (current) use of anticoagulants: Secondary | ICD-10-CM | POA: Diagnosis not present

## 2016-03-21 DIAGNOSIS — I4891 Unspecified atrial fibrillation: Secondary | ICD-10-CM | POA: Diagnosis not present

## 2016-03-28 ENCOUNTER — Encounter (HOSPITAL_BASED_OUTPATIENT_CLINIC_OR_DEPARTMENT_OTHER): Payer: PPO

## 2016-04-19 DIAGNOSIS — Z7901 Long term (current) use of anticoagulants: Secondary | ICD-10-CM | POA: Diagnosis not present

## 2016-04-19 DIAGNOSIS — I4891 Unspecified atrial fibrillation: Secondary | ICD-10-CM | POA: Diagnosis not present

## 2016-04-30 DIAGNOSIS — Z794 Long term (current) use of insulin: Secondary | ICD-10-CM | POA: Diagnosis not present

## 2016-04-30 DIAGNOSIS — R4189 Other symptoms and signs involving cognitive functions and awareness: Secondary | ICD-10-CM | POA: Diagnosis not present

## 2016-05-10 ENCOUNTER — Encounter (HOSPITAL_BASED_OUTPATIENT_CLINIC_OR_DEPARTMENT_OTHER): Payer: Self-pay

## 2016-05-10 ENCOUNTER — Encounter (HOSPITAL_BASED_OUTPATIENT_CLINIC_OR_DEPARTMENT_OTHER): Payer: PPO | Attending: Internal Medicine

## 2016-05-10 DIAGNOSIS — L97222 Non-pressure chronic ulcer of left calf with fat layer exposed: Secondary | ICD-10-CM | POA: Diagnosis not present

## 2016-05-10 DIAGNOSIS — I509 Heart failure, unspecified: Secondary | ICD-10-CM | POA: Diagnosis not present

## 2016-05-10 DIAGNOSIS — L97821 Non-pressure chronic ulcer of other part of left lower leg limited to breakdown of skin: Secondary | ICD-10-CM | POA: Insufficient documentation

## 2016-05-10 DIAGNOSIS — E1140 Type 2 diabetes mellitus with diabetic neuropathy, unspecified: Secondary | ICD-10-CM | POA: Insufficient documentation

## 2016-05-10 DIAGNOSIS — I70242 Atherosclerosis of native arteries of left leg with ulceration of calf: Secondary | ICD-10-CM | POA: Diagnosis not present

## 2016-05-10 DIAGNOSIS — I110 Hypertensive heart disease with heart failure: Secondary | ICD-10-CM | POA: Diagnosis not present

## 2016-05-10 DIAGNOSIS — E11622 Type 2 diabetes mellitus with other skin ulcer: Secondary | ICD-10-CM | POA: Diagnosis not present

## 2016-05-10 DIAGNOSIS — J449 Chronic obstructive pulmonary disease, unspecified: Secondary | ICD-10-CM | POA: Insufficient documentation

## 2016-05-13 DIAGNOSIS — S81802A Unspecified open wound, left lower leg, initial encounter: Secondary | ICD-10-CM | POA: Diagnosis not present

## 2016-05-16 DIAGNOSIS — H524 Presbyopia: Secondary | ICD-10-CM | POA: Diagnosis not present

## 2016-05-16 DIAGNOSIS — H353132 Nonexudative age-related macular degeneration, bilateral, intermediate dry stage: Secondary | ICD-10-CM | POA: Diagnosis not present

## 2016-05-20 DIAGNOSIS — I4891 Unspecified atrial fibrillation: Secondary | ICD-10-CM | POA: Diagnosis not present

## 2016-05-20 DIAGNOSIS — Z7901 Long term (current) use of anticoagulants: Secondary | ICD-10-CM | POA: Diagnosis not present

## 2016-05-20 DIAGNOSIS — I6523 Occlusion and stenosis of bilateral carotid arteries: Secondary | ICD-10-CM | POA: Diagnosis not present

## 2016-05-20 DIAGNOSIS — I739 Peripheral vascular disease, unspecified: Secondary | ICD-10-CM | POA: Diagnosis not present

## 2016-05-20 DIAGNOSIS — Z952 Presence of prosthetic heart valve: Secondary | ICD-10-CM | POA: Diagnosis not present

## 2016-05-20 DIAGNOSIS — I482 Chronic atrial fibrillation: Secondary | ICD-10-CM | POA: Diagnosis not present

## 2016-05-27 ENCOUNTER — Encounter (HOSPITAL_BASED_OUTPATIENT_CLINIC_OR_DEPARTMENT_OTHER): Payer: PPO | Attending: Internal Medicine

## 2016-05-27 DIAGNOSIS — I70248 Atherosclerosis of native arteries of left leg with ulceration of other part of lower left leg: Secondary | ICD-10-CM | POA: Diagnosis not present

## 2016-05-27 DIAGNOSIS — Z95820 Peripheral vascular angioplasty status with implants and grafts: Secondary | ICD-10-CM | POA: Diagnosis not present

## 2016-05-27 DIAGNOSIS — E11622 Type 2 diabetes mellitus with other skin ulcer: Secondary | ICD-10-CM | POA: Insufficient documentation

## 2016-05-27 DIAGNOSIS — E1140 Type 2 diabetes mellitus with diabetic neuropathy, unspecified: Secondary | ICD-10-CM | POA: Diagnosis not present

## 2016-05-27 DIAGNOSIS — J449 Chronic obstructive pulmonary disease, unspecified: Secondary | ICD-10-CM | POA: Insufficient documentation

## 2016-05-27 DIAGNOSIS — I10 Essential (primary) hypertension: Secondary | ICD-10-CM | POA: Insufficient documentation

## 2016-05-27 DIAGNOSIS — F17200 Nicotine dependence, unspecified, uncomplicated: Secondary | ICD-10-CM | POA: Diagnosis not present

## 2016-05-27 DIAGNOSIS — L97822 Non-pressure chronic ulcer of other part of left lower leg with fat layer exposed: Secondary | ICD-10-CM | POA: Diagnosis not present

## 2016-05-27 DIAGNOSIS — E1151 Type 2 diabetes mellitus with diabetic peripheral angiopathy without gangrene: Secondary | ICD-10-CM | POA: Insufficient documentation

## 2016-06-11 DIAGNOSIS — Z794 Long term (current) use of insulin: Secondary | ICD-10-CM | POA: Diagnosis not present

## 2016-06-11 DIAGNOSIS — Z7901 Long term (current) use of anticoagulants: Secondary | ICD-10-CM | POA: Diagnosis not present

## 2016-06-11 DIAGNOSIS — R627 Adult failure to thrive: Secondary | ICD-10-CM | POA: Diagnosis not present

## 2016-06-11 DIAGNOSIS — I4891 Unspecified atrial fibrillation: Secondary | ICD-10-CM | POA: Diagnosis not present

## 2016-06-11 DIAGNOSIS — I130 Hypertensive heart and chronic kidney disease with heart failure and stage 1 through stage 4 chronic kidney disease, or unspecified chronic kidney disease: Secondary | ICD-10-CM | POA: Diagnosis not present

## 2016-06-11 DIAGNOSIS — E1151 Type 2 diabetes mellitus with diabetic peripheral angiopathy without gangrene: Secondary | ICD-10-CM | POA: Diagnosis not present

## 2016-06-11 DIAGNOSIS — D649 Anemia, unspecified: Secondary | ICD-10-CM | POA: Diagnosis not present

## 2016-06-11 DIAGNOSIS — Z5189 Encounter for other specified aftercare: Secondary | ICD-10-CM | POA: Diagnosis not present

## 2016-06-11 DIAGNOSIS — Z6825 Body mass index (BMI) 25.0-25.9, adult: Secondary | ICD-10-CM | POA: Diagnosis not present

## 2016-06-21 DIAGNOSIS — H6121 Impacted cerumen, right ear: Secondary | ICD-10-CM | POA: Diagnosis not present

## 2016-06-28 ENCOUNTER — Encounter (HOSPITAL_BASED_OUTPATIENT_CLINIC_OR_DEPARTMENT_OTHER): Payer: PPO | Attending: Internal Medicine

## 2016-06-28 DIAGNOSIS — E11622 Type 2 diabetes mellitus with other skin ulcer: Secondary | ICD-10-CM | POA: Diagnosis not present

## 2016-06-28 DIAGNOSIS — M1990 Unspecified osteoarthritis, unspecified site: Secondary | ICD-10-CM | POA: Insufficient documentation

## 2016-06-28 DIAGNOSIS — J449 Chronic obstructive pulmonary disease, unspecified: Secondary | ICD-10-CM | POA: Diagnosis not present

## 2016-06-28 DIAGNOSIS — I482 Chronic atrial fibrillation: Secondary | ICD-10-CM | POA: Diagnosis not present

## 2016-06-28 DIAGNOSIS — I872 Venous insufficiency (chronic) (peripheral): Secondary | ICD-10-CM | POA: Diagnosis not present

## 2016-06-28 DIAGNOSIS — I110 Hypertensive heart disease with heart failure: Secondary | ICD-10-CM | POA: Insufficient documentation

## 2016-06-28 DIAGNOSIS — I5032 Chronic diastolic (congestive) heart failure: Secondary | ICD-10-CM | POA: Diagnosis not present

## 2016-06-28 DIAGNOSIS — L97821 Non-pressure chronic ulcer of other part of left lower leg limited to breakdown of skin: Secondary | ICD-10-CM | POA: Insufficient documentation

## 2016-06-28 DIAGNOSIS — I70242 Atherosclerosis of native arteries of left leg with ulceration of calf: Secondary | ICD-10-CM | POA: Insufficient documentation

## 2016-06-28 DIAGNOSIS — E1140 Type 2 diabetes mellitus with diabetic neuropathy, unspecified: Secondary | ICD-10-CM | POA: Diagnosis not present

## 2016-06-28 DIAGNOSIS — F329 Major depressive disorder, single episode, unspecified: Secondary | ICD-10-CM | POA: Diagnosis not present

## 2016-06-28 DIAGNOSIS — H409 Unspecified glaucoma: Secondary | ICD-10-CM | POA: Diagnosis not present

## 2016-06-28 DIAGNOSIS — Z794 Long term (current) use of insulin: Secondary | ICD-10-CM | POA: Diagnosis not present

## 2016-06-28 DIAGNOSIS — L97822 Non-pressure chronic ulcer of other part of left lower leg with fat layer exposed: Secondary | ICD-10-CM | POA: Diagnosis not present

## 2016-07-02 ENCOUNTER — Encounter (HOSPITAL_COMMUNITY): Payer: Self-pay

## 2016-07-02 ENCOUNTER — Inpatient Hospital Stay (HOSPITAL_COMMUNITY)
Admission: EM | Admit: 2016-07-02 | Discharge: 2016-07-04 | DRG: 378 | Disposition: A | Discharge status: Home / Self Care | Payer: PPO | Attending: Internal Medicine | Admitting: Internal Medicine

## 2016-07-02 ENCOUNTER — Other Ambulatory Visit: Payer: Self-pay

## 2016-07-02 DIAGNOSIS — I509 Heart failure, unspecified: Secondary | ICD-10-CM | POA: Diagnosis not present

## 2016-07-02 DIAGNOSIS — Z961 Presence of intraocular lens: Secondary | ICD-10-CM | POA: Diagnosis present

## 2016-07-02 DIAGNOSIS — M810 Age-related osteoporosis without current pathological fracture: Secondary | ICD-10-CM | POA: Diagnosis not present

## 2016-07-02 DIAGNOSIS — I2510 Atherosclerotic heart disease of native coronary artery without angina pectoris: Secondary | ICD-10-CM | POA: Diagnosis present

## 2016-07-02 DIAGNOSIS — D689 Coagulation defect, unspecified: Secondary | ICD-10-CM

## 2016-07-02 DIAGNOSIS — E1140 Type 2 diabetes mellitus with diabetic neuropathy, unspecified: Secondary | ICD-10-CM | POA: Diagnosis not present

## 2016-07-02 DIAGNOSIS — I5032 Chronic diastolic (congestive) heart failure: Secondary | ICD-10-CM | POA: Diagnosis present

## 2016-07-02 DIAGNOSIS — N179 Acute kidney failure, unspecified: Secondary | ICD-10-CM

## 2016-07-02 DIAGNOSIS — K5791 Diverticulosis of intestine, part unspecified, without perforation or abscess with bleeding: Principal | ICD-10-CM | POA: Diagnosis present

## 2016-07-02 DIAGNOSIS — D631 Anemia in chronic kidney disease: Secondary | ICD-10-CM | POA: Diagnosis present

## 2016-07-02 DIAGNOSIS — Z7901 Long term (current) use of anticoagulants: Secondary | ICD-10-CM

## 2016-07-02 DIAGNOSIS — K922 Gastrointestinal hemorrhage, unspecified: Secondary | ICD-10-CM | POA: Diagnosis present

## 2016-07-02 DIAGNOSIS — Z79899 Other long term (current) drug therapy: Secondary | ICD-10-CM

## 2016-07-02 DIAGNOSIS — I10 Essential (primary) hypertension: Secondary | ICD-10-CM | POA: Diagnosis present

## 2016-07-02 DIAGNOSIS — I482 Chronic atrial fibrillation, unspecified: Secondary | ICD-10-CM | POA: Diagnosis present

## 2016-07-02 DIAGNOSIS — D696 Thrombocytopenia, unspecified: Secondary | ICD-10-CM | POA: Diagnosis not present

## 2016-07-02 DIAGNOSIS — I701 Atherosclerosis of renal artery: Secondary | ICD-10-CM | POA: Diagnosis present

## 2016-07-02 DIAGNOSIS — K5731 Diverticulosis of large intestine without perforation or abscess with bleeding: Secondary | ICD-10-CM | POA: Diagnosis not present

## 2016-07-02 DIAGNOSIS — I4891 Unspecified atrial fibrillation: Secondary | ICD-10-CM | POA: Diagnosis not present

## 2016-07-02 DIAGNOSIS — I5033 Acute on chronic diastolic (congestive) heart failure: Secondary | ICD-10-CM | POA: Diagnosis present

## 2016-07-02 DIAGNOSIS — Z7983 Long term (current) use of bisphosphonates: Secondary | ICD-10-CM

## 2016-07-02 DIAGNOSIS — Z8551 Personal history of malignant neoplasm of bladder: Secondary | ICD-10-CM

## 2016-07-02 DIAGNOSIS — D6489 Other specified anemias: Secondary | ICD-10-CM | POA: Diagnosis not present

## 2016-07-02 DIAGNOSIS — L97929 Non-pressure chronic ulcer of unspecified part of left lower leg with unspecified severity: Secondary | ICD-10-CM | POA: Diagnosis present

## 2016-07-02 DIAGNOSIS — F17200 Nicotine dependence, unspecified, uncomplicated: Secondary | ICD-10-CM | POA: Diagnosis not present

## 2016-07-02 DIAGNOSIS — Z86010 Personal history of colonic polyps: Secondary | ICD-10-CM

## 2016-07-02 DIAGNOSIS — K625 Hemorrhage of anus and rectum: Secondary | ICD-10-CM | POA: Diagnosis not present

## 2016-07-02 DIAGNOSIS — Z794 Long term (current) use of insulin: Secondary | ICD-10-CM

## 2016-07-02 DIAGNOSIS — N183 Chronic kidney disease, stage 3 (moderate): Secondary | ICD-10-CM | POA: Diagnosis not present

## 2016-07-02 DIAGNOSIS — N184 Chronic kidney disease, stage 4 (severe): Secondary | ICD-10-CM | POA: Diagnosis present

## 2016-07-02 DIAGNOSIS — Z9842 Cataract extraction status, left eye: Secondary | ICD-10-CM

## 2016-07-02 DIAGNOSIS — Z953 Presence of xenogenic heart valve: Secondary | ICD-10-CM

## 2016-07-02 DIAGNOSIS — I739 Peripheral vascular disease, unspecified: Secondary | ICD-10-CM | POA: Diagnosis not present

## 2016-07-02 DIAGNOSIS — E872 Acidosis, unspecified: Secondary | ICD-10-CM

## 2016-07-02 DIAGNOSIS — J449 Chronic obstructive pulmonary disease, unspecified: Secondary | ICD-10-CM | POA: Diagnosis present

## 2016-07-02 DIAGNOSIS — E1151 Type 2 diabetes mellitus with diabetic peripheral angiopathy without gangrene: Secondary | ICD-10-CM | POA: Diagnosis present

## 2016-07-02 DIAGNOSIS — F17210 Nicotine dependence, cigarettes, uncomplicated: Secondary | ICD-10-CM | POA: Diagnosis not present

## 2016-07-02 DIAGNOSIS — E1122 Type 2 diabetes mellitus with diabetic chronic kidney disease: Secondary | ICD-10-CM | POA: Diagnosis present

## 2016-07-02 DIAGNOSIS — D62 Acute posthemorrhagic anemia: Secondary | ICD-10-CM | POA: Diagnosis not present

## 2016-07-02 DIAGNOSIS — D649 Anemia, unspecified: Secondary | ICD-10-CM | POA: Diagnosis not present

## 2016-07-02 DIAGNOSIS — G8929 Other chronic pain: Secondary | ICD-10-CM | POA: Diagnosis not present

## 2016-07-02 DIAGNOSIS — E11649 Type 2 diabetes mellitus with hypoglycemia without coma: Secondary | ICD-10-CM | POA: Diagnosis not present

## 2016-07-02 DIAGNOSIS — Z955 Presence of coronary angioplasty implant and graft: Secondary | ICD-10-CM

## 2016-07-02 DIAGNOSIS — I350 Nonrheumatic aortic (valve) stenosis: Secondary | ICD-10-CM | POA: Diagnosis not present

## 2016-07-02 DIAGNOSIS — Z886 Allergy status to analgesic agent status: Secondary | ICD-10-CM

## 2016-07-02 DIAGNOSIS — I6522 Occlusion and stenosis of left carotid artery: Secondary | ICD-10-CM | POA: Diagnosis not present

## 2016-07-02 DIAGNOSIS — I130 Hypertensive heart and chronic kidney disease with heart failure and stage 1 through stage 4 chronic kidney disease, or unspecified chronic kidney disease: Secondary | ICD-10-CM | POA: Diagnosis not present

## 2016-07-02 DIAGNOSIS — Z8249 Family history of ischemic heart disease and other diseases of the circulatory system: Secondary | ICD-10-CM

## 2016-07-02 DIAGNOSIS — Z9841 Cataract extraction status, right eye: Secondary | ICD-10-CM

## 2016-07-02 DIAGNOSIS — R0902 Hypoxemia: Secondary | ICD-10-CM | POA: Diagnosis not present

## 2016-07-02 LAB — CBC WITH DIFFERENTIAL/PLATELET
Basophils Absolute: 0 10*3/uL (ref 0.0–0.1)
Basophils Relative: 0 %
Eosinophils Absolute: 0.1 10*3/uL (ref 0.0–0.7)
Eosinophils Relative: 1 %
HEMATOCRIT: 19.4 % — AB (ref 39.0–52.0)
HEMOGLOBIN: 6.5 g/dL — AB (ref 13.0–17.0)
LYMPHS ABS: 1.4 10*3/uL (ref 0.7–4.0)
Lymphocytes Relative: 13 %
MCH: 29 pg (ref 26.0–34.0)
MCHC: 33.5 g/dL (ref 30.0–36.0)
MCV: 86.6 fL (ref 78.0–100.0)
MONOS PCT: 7 %
Monocytes Absolute: 0.8 10*3/uL (ref 0.1–1.0)
NEUTROS PCT: 79 %
Neutro Abs: 8.4 10*3/uL — ABNORMAL HIGH (ref 1.7–7.7)
Platelets: 96 10*3/uL — ABNORMAL LOW (ref 150–400)
RBC: 2.24 MIL/uL — AB (ref 4.22–5.81)
RDW: 17.3 % — AB (ref 11.5–15.5)
WBC: 10.7 10*3/uL — AB (ref 4.0–10.5)

## 2016-07-02 LAB — COMPREHENSIVE METABOLIC PANEL
ALBUMIN: 3 g/dL — AB (ref 3.5–5.0)
ALT: 18 U/L (ref 17–63)
ANION GAP: 10 (ref 5–15)
AST: 22 U/L (ref 15–41)
Alkaline Phosphatase: 49 U/L (ref 38–126)
BILIRUBIN TOTAL: 0.4 mg/dL (ref 0.3–1.2)
BUN: 64 mg/dL — ABNORMAL HIGH (ref 6–20)
CALCIUM: 8.6 mg/dL — AB (ref 8.9–10.3)
CO2: 24 mmol/L (ref 22–32)
Chloride: 105 mmol/L (ref 101–111)
Creatinine, Ser: 2.2 mg/dL — ABNORMAL HIGH (ref 0.61–1.24)
GFR calc Af Amer: 28 mL/min — ABNORMAL LOW (ref >60)
GFR calc non Af Amer: 24 mL/min — ABNORMAL LOW (ref >60)
Glucose, Bld: 295 mg/dL — ABNORMAL HIGH (ref 65–99)
Potassium: 4.3 mmol/L (ref 3.5–5.1)
Sodium: 139 mmol/L (ref 135–145)
TOTAL PROTEIN: 5.5 g/dL — AB (ref 6.5–8.1)

## 2016-07-02 LAB — I-STAT CG4 LACTIC ACID, ED: Lactic Acid, Venous: 3.11 mmol/L (ref 0.5–1.9)

## 2016-07-02 LAB — LIPASE, BLOOD: LIPASE: 17 U/L (ref 11–51)

## 2016-07-02 LAB — PREPARE RBC (CROSSMATCH)

## 2016-07-02 LAB — PROTIME-INR
INR: 3.13
Prothrombin Time: 32.9 seconds — ABNORMAL HIGH (ref 11.4–15.2)

## 2016-07-02 LAB — GLUCOSE, CAPILLARY
GLUCOSE-CAPILLARY: 173 mg/dL — AB (ref 65–99)
GLUCOSE-CAPILLARY: 159 mg/dL — AB (ref 65–99)

## 2016-07-02 MED ORDER — SODIUM CHLORIDE 0.9 % IV SOLN
INTRAVENOUS | Status: DC
  Administered 2016-07-02 – 2016-07-03 (×2): via INTRAVENOUS

## 2016-07-02 MED ORDER — ONDANSETRON HCL 4 MG PO TABS: 4.0000 mg | ORAL_TABLET | Freq: Four times a day (QID) | ORAL | Status: DC | PRN

## 2016-07-02 MED ORDER — ACETAMINOPHEN 650 MG RE SUPP: 650.0000 mg | Freq: Four times a day (QID) | RECTAL | Status: DC | PRN

## 2016-07-02 MED ORDER — UMECLIDINIUM-VILANTEROL 62.5-25 MCG/INH IN AEPB
1.0000 | INHALATION_SPRAY | Freq: Every day | RESPIRATORY_TRACT | Status: DC
  Administered 2016-07-03 – 2016-07-04 (×2): 1 via RESPIRATORY_TRACT
  Filled 2016-07-02: qty 14

## 2016-07-02 MED ORDER — SIMVASTATIN 20 MG PO TABS
20.0000 mg | ORAL_TABLET | Freq: Every day | ORAL | Status: DC
  Administered 2016-07-03: 20 mg via ORAL
  Filled 2016-07-02: qty 1

## 2016-07-02 MED ORDER — INSULIN GLARGINE 100 UNIT/ML ~~LOC~~ SOLN: 12.0000 [IU] | Freq: Every day | SUBCUTANEOUS | Status: DC

## 2016-07-02 MED ORDER — HYDROCODONE-ACETAMINOPHEN 5-325 MG PO TABS: 1.0000 | ORAL_TABLET | Freq: Four times a day (QID) | ORAL | Status: DC | PRN

## 2016-07-02 MED ORDER — ONDANSETRON HCL 4 MG/2ML IJ SOLN: 4.0000 mg | Freq: Four times a day (QID) | INTRAMUSCULAR | Status: DC | PRN

## 2016-07-02 MED ORDER — INSULIN GLARGINE 100 UNIT/ML SOLOSTAR PEN: 12.0000 [IU] | PEN_INJECTOR | Freq: Every day | SUBCUTANEOUS | Status: DC

## 2016-07-02 MED ORDER — TRAMADOL HCL 50 MG PO TABS
50.0000 mg | ORAL_TABLET | Freq: Two times a day (BID) | ORAL | Status: DC
  Administered 2016-07-03 – 2016-07-04 (×3): 50 mg via ORAL
  Filled 2016-07-02 (×4): qty 1

## 2016-07-02 MED ORDER — ACETAMINOPHEN 325 MG PO TABS: 650.0000 mg | ORAL_TABLET | Freq: Four times a day (QID) | ORAL | Status: DC | PRN

## 2016-07-02 MED ORDER — SODIUM CHLORIDE 0.9 % IV SOLN
10.0000 mL/h | Freq: Once | INTRAVENOUS | Status: AC
  Administered 2016-07-02: 10 mL/h via INTRAVENOUS

## 2016-07-02 MED ORDER — BUPROPION HCL ER (XL) 150 MG PO TB24
150.0000 mg | ORAL_TABLET | Freq: Every day | ORAL | Status: DC
  Administered 2016-07-03 – 2016-07-04 (×2): 150 mg via ORAL
  Filled 2016-07-02 (×2): qty 1

## 2016-07-02 MED ORDER — INSULIN ASPART 100 UNIT/ML ~~LOC~~ SOLN
0.0000 [IU] | Freq: Three times a day (TID) | SUBCUTANEOUS | Status: DC
  Administered 2016-07-02: 2 [IU] via SUBCUTANEOUS
  Administered 2016-07-03: 5 [IU] via SUBCUTANEOUS
  Administered 2016-07-03 – 2016-07-04 (×2): 1 [IU] via SUBCUTANEOUS
  Administered 2016-07-04: 0 [IU] via SUBCUTANEOUS

## 2016-07-02 MED ORDER — FLUTICASONE PROPIONATE 50 MCG/ACT NA SUSP
1.0000 | Freq: Every day | NASAL | Status: DC
  Administered 2016-07-03 – 2016-07-04 (×2): 1 via NASAL
  Filled 2016-07-02: qty 16

## 2016-07-02 MED ORDER — INSULIN ASPART 100 UNIT/ML ~~LOC~~ SOLN: 0.0000 [IU] | Freq: Every day | SUBCUTANEOUS | Status: DC

## 2016-07-02 MED ORDER — INSULIN GLARGINE 100 UNIT/ML ~~LOC~~ SOLN
12.0000 [IU] | Freq: Every day | SUBCUTANEOUS | Status: DC
  Filled 2016-07-02: qty 0.12

## 2016-07-02 MED ORDER — SODIUM CHLORIDE 0.9 % IV SOLN
INTRAVENOUS | Status: DC
  Administered 2016-07-02: 13:00:00 via INTRAVENOUS

## 2016-07-02 MED ORDER — FUROSEMIDE 40 MG PO TABS
40.0000 mg | ORAL_TABLET | Freq: Two times a day (BID) | ORAL | Status: DC
  Administered 2016-07-03: 40 mg via ORAL
  Filled 2016-07-02: qty 1

## 2016-07-02 MED ORDER — METOLAZONE 2.5 MG PO TABS: 2.5000 mg | ORAL_TABLET | ORAL | Status: DC

## 2016-07-02 MED ORDER — METOPROLOL SUCCINATE ER 50 MG PO TB24
50.0000 mg | ORAL_TABLET | Freq: Two times a day (BID) | ORAL | Status: DC
  Administered 2016-07-03 – 2016-07-04 (×3): 50 mg via ORAL
  Filled 2016-07-02 (×3): qty 1

## 2016-07-02 NOTE — Progress Notes (Signed)
Pt receiving blood, lactic acid 3.11, BP 112/33, HR 59. Pt has Toprol XL 50 mg scheduled. Will hold med, Baltazar Najjar, NP notified.

## 2016-07-02 NOTE — ED Triage Notes (Signed)
PER EMS: pt sent here from [facility] for rectal bleeding x 2 days with a hgb of 6.

## 2016-07-02 NOTE — ED Notes (Addendum)
Critical Lab value of Hgb 6.5. EDP notified.

## 2016-07-02 NOTE — ED Notes (Signed)
Pt has signed blood transfusion consent form, family at bedside.

## 2016-07-02 NOTE — H&P (Signed)
History and Physical    Ronald [name] GXQ:[id number] DOB: 1922/04/05 DOA: 07/02/2016  PCP: Ronald Baton, MD Patient coming from: Home  Chief Complaint: rectal bleeding  HPI: Ronald [name] is a 83 y.o. male with medical history significant of diabetes, thrombus cytopenia, renal artery stenosis, CAD, hypertension, PVD, COPD, depression, chronic diastolic congestive heart failure, bladder cancer, aortic stenosis status post bovine aortic valve replacement in 2012. Patient presenting with 1 day history of bright red blood per rectum. Patient described his bowel movements as nothing but blood. Associated with generalized weakness. States that on the day prior to admission patient had a bowel movement approximately every 30 minutes after initial episode. That evening patient had a single dark bowel movement with fecal matter. On day of admission patient has only had one bloody bowel movement. Patient seen by primary care physician who sent patient to the emergency room for further evaluation.  Denies NSAID use, chest pain, palpitations, nausea, vomiting, abdominal pain, dysuria, fever, flank pain, neck stiffness, LOC, focal neurological deficit.   ED Course: Objective findings outlined below. PRBCs ordered.  Review of Systems: As per HPI otherwise all other systems reviewed and are negative  Ambulatory Status: no restrictions  Past Medical History:  Diagnosis Date  . AS (aortic stenosis)    bovine aortic valve replacement 01/07/11  . Bladder cancer [facility])    Bladder Cancer local  . Blood transfusion    w/hip operation  . Cellulitis of left lower extremity   . Chronic atrial fibrillation ([place])   . Chronic diastolic congestive heart failure ([place])   . Claudication in peripheral vascular disease ([place]) 06/17/2011  . COPD (chronic obstructive pulmonary disease) (Ronald [name])   . Depression wife died 4 years ago.    Marland Kitchen History of stomach ulcers ~ 1951  . HTN (hypertension)   . Neuropathy due to secondary  diabetes (Ronald [name])   . Normal coronary arteries Sept 2012  . Peripheral vascular disease ([place]) very poor circulation legs and feet ... stents right and left legs... done in dr j. Gwenlyn Found 's office.   . Pneumonia 07/25/11   left  . Recurrent upper respiratory infection (URI)    sinusitis  . Renal artery stenosis ([place]) 2006   renal artery stent  . S/P angioplasty with stent, diamond back rotational athrectomy Prox. Rt. SFA 06/17/2011 06/17/2011  . Shortness of breath   . Thrombocytopenia due to drugs    seen by Dr Ronald [name] plts [id number] no rx  . Type II diabetes mellitus ([place])     Past Surgical History:  Procedure Laterality Date  . ABDOMINAL ANGIOGRAM  06/17/2011   Procedure: ABDOMINAL ANGIOGRAM;  Surgeon: Lorretta Harp, MD;  Location: [facility] CATH LAB;  Service: Cardiovascular;;  . AORTIC VALVE REPLACEMENT  01/07/2011   Procedure: AORTIC VALVE REPLACEMENT (AVR);  Surgeon: Grace Isaac, MD;  Location: [place];  Service: Open Heart Surgery;  Laterality: N/A;; magna-ease bovine 24m bioprosthesis  . ATHERECTOMY N/A 06/17/2011   Procedure: ATHERECTOMY;  Surgeon: JLorretta Harp MD;  [facility];  Service: Cardiovascular;  Laterality: N/A;  . CARDIAC CATHETERIZATION  11/19/10   normal coronaries, mod AS, 75% l RAS  . CARDIOVERSION  04/03/2011   Procedure: CARDIOVERSION;  Surgeon: DLeonie Man MD;  [place]  Service: Cardiovascular;  Laterality: N/A;  . CATARACT EXTRACTION W/ INTRAOCULAR LENS  IMPLANT, BILATERAL  ~ 2007  . CHOLECYSTECTOMY    . FEMUR IM NAIL  06/27/2011   Procedure: INTRAMEDULLARY (IM) NAIL FEMORAL;  Surgeon: Marin Shutter, MD;  [facility];  Service: Orthopedics;  Laterality: Right;  . FEMUR IM NAIL Left 12/14/2013   Procedure: INTRAMEDULLARY (IM) NAIL FEMORAL;  Surgeon: Mauri Pole, MD;  [facility];  Service: Orthopedics;  Laterality: Left;  . LOWER EXTREMITY ANGIOGRAM  06/17/2011   diamondback orbital rotational and cutting balloon atherectomy of  the prox R SFA  . LOWER EXTREMITY ANGIOGRAM Bilateral 06/17/2011   Procedure: LOWER EXTREMITY ANGIOGRAM;  Surgeon: Lorretta Harp, MD;  Location: [facility] CATH LAB;  Service: Cardiovascular;  Laterality: Bilateral;  . PERIPHERAL ARTERIAL STENT GRAFT     2006 left anf right illiac stents Dr Deon Pilling  . RENAL ARTERY STENT  2006   "I believe"  . TONSILLECTOMY AND ADENOIDECTOMY     "when I was a kid"    Social History   Social History  . Marital status: Widowed    Spouse name: N/A  . Number of children: 3  . Years of education: N/A   Occupational History  . sales    Social History Main Topics  . Smoking status: Current Every Day Smoker    Packs/day: 1.50    Years: 75.00    Types: Cigarettes  . Smokeless tobacco: Never Used  . Alcohol use No  . Drug use: No  . Sexual activity: No   Other Topics Concern  . Not on file   Social History Narrative  . No narrative on file    Allergies  Allergen Reactions  . Aspirin Anaphylaxis, Shortness Of Breath and Rash    "broke out in white welts; red blotches neck and face; windpipe closing; ~ 1962"    Family History  Problem Relation Age of Onset  . Heart disease Mother   . Cancer Brother     colon  . Stroke Father     Prior to Admission medications   Medication Sig Start Date End Date Taking? Authorizing Provider  alendronate (FOSAMAX) 70 MG tablet Take 70 mg by mouth every 14 (fourteen) days. Take 1 tablet every other Sunday with a full glass of water on an empty stomach.    [provider]  buPROPion (WELLBUTRIN XL) 150 MG 24 hr tablet Take 150 mg by mouth daily.    [provider]  doxazosin (CARDURA) 4 MG tablet Take 4 mg by mouth every evening.     Collins, Gina L, PA-C  finasteride (PROSCAR) 5 MG tablet Take 5 mg by mouth daily.     [provider]  fluticasone (FLONASE) 50 MCG/ACT nasal spray Place 1 spray into both nostrils daily.    [provider]  furosemide (LASIX) 40 MG tablet Take 1  tablet (40 mg total) by mouth 2 (two) times daily. 10/18/15   Sueanne Margarita, MD  glimepiride (AMARYL) 2 MG tablet Take 2 mg by mouth daily with breakfast.    [provider]  glyBURIDE (DIABETA) 5 MG tablet Take 2.5-10 mg by mouth 2 (two) times daily with a meal. 2 tablets in the morning and 1/2 tablet in the evening    [provider]  HYDROcodone-acetaminophen (NORCO/VICODIN) 5-325 MG per tablet Take 1 tablet by mouth every 6 (six) hours as needed for moderate pain. 10/04/14   Lawyer, Harrell Gave, PA-C  Insulin Glargine (LANTUS) 100 UNIT/ML Solostar Pen Inject 24 Units into the skin daily at 10 pm.    [provider]  loratadine (CLARITIN) 10 MG tablet Take 10 mg by mouth daily.    [provider]  metFORMIN (GLUCOPHAGE) 500 MG tablet Take 500 mg by mouth daily.    [provider]  metolazone (ZAROXOLYN) 2.5 MG tablet Take 2.5 mg by mouth once a week. Take 2.5-5 mg once a week for weight.    [provider]  metoprolol succinate (TOPROL-XL) 50 MG 24 hr tablet Take 50 mg by mouth 2 (two) times daily. Take with or immediately following a meal.    [provider]  PHOSPHATIDYLSERINE PO Take 1 tablet by mouth daily.    [provider]  potassium chloride SA (K-DUR,KLOR-CON) 20 MEQ tablet Take 20 mEq by mouth daily.    [provider]  Salicylic Acid-Cleanser 6 % (Cream) KIT Apply topically. Used as directed    [provider]  simvastatin (ZOCOR) 20 MG tablet Take 20 mg by mouth every evening.    [provider]  traMADol (ULTRAM) 50 MG tablet Take 50 mg by mouth 2 (two) times daily.    [provider]  Umeclidinium-Vilanterol (ANORO ELLIPTA) 62.5-25 MCG/INH AEPB Inhale 1 puff into the lungs daily.    [provider]  warfarin (COUMADIN) 2 MG tablet Take 2 mg by mouth daily. 01/12/15   [provider]  warfarin (COUMADIN) 5 MG tablet Take 2.5 mg by mouth every evening.      [provider]    Physical Exam: Vitals:   07/02/16 1437 07/02/16 1530 07/02/16 1551 07/02/16 1606  BP: (!) 125/47 (!) 126/42  (!) 120/41  Pulse: (!) 58 62  (!) 59  Resp: _0 Temp:  97.6 F (36.4 C)  97.9 F (36.6 C)  TempSrc:  Oral  Oral  SpO2: 100% 95%  100%  Weight:   80.8 kg (178 lb 2.1 oz)   Height:   _1  (1.803 m)      General:  Appears calm and comfortable Eyes:  PERRL, EOMI, normal lids, iris ENT:  grossly normal hearing, lips & tongue, mmm Neck:  no LAD, masses or thyromegaly Cardiovascular:  RRR, no m/r/g. No LE edema.  Respiratory:  CTA bilaterally, no w/r/r. Normal respiratory effort. Abdomen:  soft, ntnd, NABS Skin:  no rash or induration seen on limited exam Musculoskeletal:  grossly normal tone BUE/BLE, good ROM, no bony abnormality Psychiatric:  grossly normal mood and affect, speech fluent and appropriate, AOx3 Neurologic:  CN 2-12 grossly intact, moves all extremities in coordinated fashion, sensation intact  Labs on Admission: I have personally reviewed following labs and imaging studies  CBC:  Recent Labs Lab 07/02/16 1255  WBC 10.7*  NEUTROABS 8.4*  HGB 6.5*  HCT 19.4*  MCV 86.6  PLT 96*   Basic Metabolic Panel:  Recent Labs Lab 07/02/16 1255  NA 139  K 4.3  CL 105  CO2 24  GLUCOSE 295*  BUN 64*  CREATININE 2.20*  CALCIUM 8.6*   GFR: Estimated Creatinine Clearance: 22.8 mL/min (A) (by C-G formula based on SCr of 2.2 mg/dL (H)). Liver Function Tests:  Recent Labs Lab 07/02/16 1255  AST 22  ALT 18  ALKPHOS 49  BILITOT 0.4  PROT 5.5*  ALBUMIN 3.0*    Recent Labs Lab 07/02/16 1255  LIPASE 17   No results for input(s): AMMONIA in the last 168 hours. Coagulation Profile:  Recent Labs Lab 07/02/16 1255  INR 3.13   Cardiac Enzymes: No results for input(s): CKTOTAL, CKMB, CKMBINDEX, TROPONINI in the last 168 hours. BNP (last 3 results) No results for input(s): PROBNP in the last 8760  hours. HbA1C: No results for input(s): HGBA1C in the last 72 hours. CBG: No results for input(s): GLUCAP in the last 168 hours. Lipid Profile: No results for input(s): CHOL, HDL, LDLCALC, TRIG, CHOLHDL, LDLDIRECT in the last 72 hours. Thyroid Function Tests: No results for input(s): TSH, T4TOTAL, FREET4, T3FREE, THYROIDAB in the last 72 hours. Anemia Panel: No results for input(s): VITAMINB12, FOLATE, FERRITIN, TIBC, IRON, RETICCTPCT in the last 72 hours. Urine analysis:    Component Value Date/Time   COLORURINE YELLOW 12/13/2013 1556   APPEARANCEUR CLOUDY (A) 12/13/2013 1556   LABSPEC 1.012 12/13/2013 1556   PHURINE 6.0 12/13/2013 1556   GLUCOSEU NEGATIVE 12/13/2013 1556   HGBUR NEGATIVE 12/13/2013 1556   BILIRUBINUR NEGATIVE 12/13/2013 1556   KETONESUR NEGATIVE 12/13/2013 1556   PROTEINUR NEGATIVE 12/13/2013 1556   UROBILINOGEN 1.0 12/13/2013 1556   NITRITE NEGATIVE 12/13/2013 1556   LEUKOCYTESUR NEGATIVE 12/13/2013 1556    Creatinine Clearance: Estimated Creatinine Clearance: 22.8 mL/min (A) (by C-G formula based on SCr of 2.2 mg/dL (H)).  Sepsis Labs: _0 (procalcitonin:4,lacticidven:4) )No results found for this or any previous visit (from the past 240 hour(s)).   Radiological Exams on Admission: No results found.  EKG: pending  Assessment/Plan Active Problems:   Essential hypertension   AS (aortic stenosis)- s/p tissue AVR 01/07/11   Chronic atrial fibrillation (HCC)   Chronic diastolic heart failure (HCC)   Chronic renal insufficiency, stage IV (severe) (HCC)   Symptomatic anemia   GI bleed   AKI (acute kidney injury) ([place])   Lactic acidemia   GI bleed: Likely diverticular in the setting of anticoagulation from A. fib with an INR of 3. Bleed may artery be self resolving given he's only had one bloody bowel movement on day of admission when on the day before admission he had bloody bowel movements approximately every 30 minutes. No NSAID use and no  reported melena. - Formal consult by GI. - Clear liquid diet - Hold Coumadin - After formal evaluation by GI discussions will need to be had with regards to cessation versus continuation of Coumadin.  Symptomatic anemia: Hemoglobin 6.5. Baseline 11.5. Very symptomatic. - 2 units PRBC ordered by EDP. - Posterior transfusion labs with repeat CBC in a.m. - Treatment of GI bleed as above.  Acute on chronic kidney disease: Currently 2.2. Baseline 1.5. Likely secondary to hypoperfusion due to profound anemia. - Transfusion as above, IVF - BMP in a.m.  Lactic acidemia: Lactic acid 3.11 on admission. Likely secondary to hypoperfusion due to profound anemia. - Trend  Afib/CAD/Prosthetic valve: rate controlled. Troopnin negative - Hold Coumadin. Consider resuming given high risk of stroke due to valve and since this is pts first incident of GI bleed - Continue Statin - Resume bblocker in am  HTN: - resume Metoprolol in am  DM: - continue lantus at reduced dose (12 units as on 24 units at home) - SSI  Chronic diastolic congestive heart failure: Well compensated. - Resume Lasix, metolazone, in a.m.  Chronic pain: - continue tramadol, Norco,    DVT prophylaxis: SCD  Code Status: FULL  Family Communication: son  Disposition Plan: pending improvement in GI bleed  Consults called: [facility]  Admission status: inpt    Leverett Camplin J MD [facility]  If 7PM-7AM, please contact night-coverage [email or web address] Password [facility]  07/02/2016, 4:41 PM

## 2016-07-02 NOTE — Consult Note (Signed)
[facility] Consult: 2:51 PM 07/02/2016  LOS: 0 days    Referring Provider: Dr Marily Memos  Primary Care Physician:  [name], MD Primary Gastroenterologist:  Dr.Patterson     Reason for Consultation:  Painless hematochezia.   HPI: Ronald [name] is a 83 y.o. male.  PMH type 2 DM, insulin requiring.  PVD.  s/p renal artery stent.  s/p bil iliac stents. Chronic Coumadin for chronic A fib.   Diastolic CHF.  Bladder cancer.  Aortic stenosis, s/p bovine AVR May 09, 2010.  gastric ulcer in 1950s.  COPD.  Smoker.  chronic thrombocytopenia dating back to 05/08/12 with counts in the 60s to 80s.  Osteoporosis on Fosamax.  Stage 3 CKD.   Hx colon polyps.  Hyperplastic polyp 1991.  Tubular adenoma 2003/05/09.  His brother died at age 57 from colon cancer.  07/2006 Colonoscopy, or constipation and hx adenomatous polyp:  Lipomatous IC valve and diverticulosis.  I do not see any previous upper endoscopies in the Epic record.  Sent to the emergency room for admission by PMD today. Starting yesterday morning and through at least yesterday evening, possibly overnight, the patient was passing red blood "every half hour ". This was moderately large volume.  He stayed at work all day and came home at about 5 PM. At that point he felt presyncopal and weak. His heart was racing at times but not having any chest pain. The last stools he had, he can't tell me when that was but he thinks it was a small morning, were dark. He has not had any nausea or vomiting. There's been no abdominal pain. Patient isn't using any NSAIDs or aspirin. Hemoglobin today is 6.5, platelets are 96 K. MCV 86. Only comparison labs are from at least 2 years ago. In 10/2014 hgb was 11.5 but that same year he had hgbs averaging around 8.   INR 3.1.   His normally stage 3 CKD is now stage  4, so ? AKI.  Serum glucose 295.  PRBC x 2 ordered.  Normally stools are brown and occur 1-2 times a day. He has never seen even minor amounts of rectal bleeding. His appetite is good. His weight is stable.    Past Medical History:  Diagnosis Date  . AS (aortic stenosis)    bovine aortic valve replacement 01/07/11  . Bladder cancer [facility])    Bladder Cancer local  . Blood transfusion    w/hip operation  . Cellulitis of left lower extremity   . Chronic atrial fibrillation ([place])   . Chronic diastolic congestive heart failure ([place])   . Claudication in peripheral vascular disease ([place]) 06/17/2011  . COPD (chronic obstructive pulmonary disease) ([place])   . Depression wife died 4 years ago.    Marland Kitchen History of stomach ulcers ~ 1951  . HTN (hypertension)   . Neuropathy due to secondary diabetes ([place])   . Normal coronary arteries Sept 2012  . Peripheral vascular disease ([place]) very poor circulation legs and feet ... stents right and left legs... done in dr j. Gwenlyn Found 's office.   Marland Kitchen  Pneumonia 07/25/11   left  . Recurrent upper respiratory infection (URI)    sinusitis  . Renal artery stenosis ([place]) 2006   renal artery stent  . S/P angioplasty with stent, diamond back rotational athrectomy Prox. Rt. SFA 06/17/2011 06/17/2011  . Shortness of breath   . Thrombocytopenia due to drugs    seen by Dr Inda Merlin plts [id number] no rx  . Type II diabetes mellitus ([place])     Past Surgical History:  Procedure Laterality Date  . ABDOMINAL ANGIOGRAM  06/17/2011   Procedure: ABDOMINAL ANGIOGRAM;  Surgeon: Lorretta Harp, MD;  Location[facility] CATH LAB;  Service: Cardiovascular;;  . AORTIC VALVE REPLACEMENT  01/07/2011   Procedure: AORTIC VALVE REPLACEMENT (AVR);  Surgeon: Grace Isaac, MD;  Location: [place];  Service: Open Heart Surgery;  Laterality: N/A;; magna-ease bovine 41m bioprosthesis  . ATHERECTOMY N/A 06/17/2011   Procedure: ATHERECTOMY;  Surgeon: JLorretta Harp MD;  [facility];  Service:  Cardiovascular;  Laterality: N/A;  . CARDIAC CATHETERIZATION  11/19/10   normal coronaries, mod AS, 75% l RAS  . CARDIOVERSION  04/03/2011   Procedure: CARDIOVERSION;  Surgeon: DLeonie Man MD;  [facility] [facility]  Service: Cardiovascular;  Laterality: N/A;  . CATARACT EXTRACTION W/ INTRAOCULAR LENS  IMPLANT, BILATERAL  ~ 2007  . CHOLECYSTECTOMY    . FEMUR IM NAIL  06/27/2011   Procedure: INTRAMEDULLARY (IM) NAIL FEMORAL;  Surgeon: KMarin Shutter MD;  Location: [facility];  Service: Orthopedics;  Laterality: Right;  . FEMUR IM NAIL Left 12/14/2013   Procedure: INTRAMEDULLARY (IM) NAIL FEMORAL;  Surgeon: MMauri Pole MD;  Location: [facility];  Service: Orthopedics;  Laterality: Left;  . LOWER EXTREMITY ANGIOGRAM  06/17/2011   diamondback orbital rotational and cutting balloon atherectomy of the prox R SFA  . LOWER EXTREMITY ANGIOGRAM Bilateral 06/17/2011   Procedure: LOWER EXTREMITY ANGIOGRAM;  Surgeon: JLorretta Harp MD;  [facility];  Service: Cardiovascular;  Laterality: Bilateral;  . PERIPHERAL ARTERIAL STENT GRAFT     2006 left anf right illiac stents Dr BDeon Pilling . RENAL ARTERY STENT  2006   "I believe"  . TONSILLECTOMY AND ADENOIDECTOMY     "when I was a kid"    Prior to Admission medications   Medication Sig Start Date End Date Taking? Authorizing Provider  alendronate (FOSAMAX) 70 MG tablet Take 70 mg by mouth every 14 (fourteen) days. Take 1 tablet every other Sunday with a full glass of water on an empty stomach.    [provider]  buPROPion (WELLBUTRIN XL) 150 MG 24 hr tablet Take 150 mg by mouth daily.    [provider]  doxazosin (CARDURA) 4 MG tablet Take 4 mg by mouth every evening.     Collins, Gina L, PA-C  finasteride (PROSCAR) 5 MG tablet Take 5 mg by mouth daily.     [provider]  fluticasone (FLONASE) 50 MCG/ACT nasal spray Place 1 spray into both nostrils daily.    [provider]  furosemide (LASIX) 40 MG tablet Take 1  tablet (40 mg total) by mouth 2 (two) times daily. 10/18/15   TSueanne Margarita MD  glimepiride (AMARYL) 2 MG tablet Take 2 mg by mouth daily with breakfast.    [provider]  glyBURIDE (DIABETA) 5 MG tablet Take 2.5-10 mg by mouth 2 (two) times daily with a meal. 2 tablets in the morning and 1/2 tablet in the evening    [provider]  HYDROcodone-acetaminophen (NORCO/VICODIN) 5-325 MG per tablet Take 1 tablet by mouth every 6 (six) hours as needed for moderate pain. 10/04/14   Lawyer, Harrell Gave, PA-C  Insulin Glargine (LANTUS) 100 UNIT/ML Solostar Pen Inject 24 Units into the skin daily at 10 pm.    [provider]  loratadine (CLARITIN) 10 MG tablet Take 10 mg by mouth daily.    [provider]  metFORMIN (GLUCOPHAGE) 500 MG tablet Take 500 mg by mouth daily.    [provider]  metolazone (ZAROXOLYN) 2.5 MG tablet Take 2.5 mg by mouth once a week. Take 2.5-5 mg once a week for weight.    [provider]  metoprolol succinate (TOPROL-XL) 50 MG 24 hr tablet Take 50 mg by mouth 2 (two) times daily. Take with or immediately following a meal.    [provider]  PHOSPHATIDYLSERINE PO Take 1 tablet by mouth daily.    [provider]  potassium chloride SA (K-DUR,KLOR-CON) 20 MEQ tablet Take 20 mEq by mouth daily.    [provider]  Salicylic Acid-Cleanser 6 % (Cream) KIT Apply topically. Used as directed    [provider]  simvastatin (ZOCOR) 20 MG tablet Take 20 mg by mouth every evening.    [provider]  traMADol (ULTRAM) 50 MG tablet Take 50 mg by mouth 2 (two) times daily.    [provider]  Umeclidinium-Vilanterol (ANORO ELLIPTA) 62.5-25 MCG/INH AEPB Inhale 1 puff into the lungs daily.    [provider]  warfarin (COUMADIN) 2 MG tablet Take 2 mg by mouth daily. 01/12/15   [provider]  warfarin (COUMADIN) 5 MG tablet Take 2.5 mg by mouth every evening.      [provider]    Scheduled Meds:  Infusions: . sodium chloride 125 mL/hr at 07/02/16 1305  . sodium chloride     PRN Meds:    Allergies as of 07/02/2016 - Review Complete 07/02/2016  Allergen Reaction Noted  . Aspirin Anaphylaxis, Shortness Of Breath, and Rash 05/30/2008    Family History  Problem Relation Age of Onset  . Heart disease Mother   . Cancer Brother     colon  . Stroke Father     Social History   Social History  . Marital status: Widowed    Spouse name: N/A  . Number of children: 3  . Years of education: N/A   Occupational History  . sales    Social History Main Topics  . Smoking status: Current Every Day Smoker    Packs/day: 1.50    Years: 75.00    Types: Cigarettes  . Smokeless tobacco: Never Used  . Alcohol use No  . Drug use: No  . Sexual activity: No   Other Topics Concern  . Not on file   Social History Narrative  . No narrative on file    REVIEW OF SYSTEMS: Constitutional:  Per HPI ENT:  Infrequent occurrence of nose bleeds, last was about 3 months ago. Pulm:  No SOB or cough.  He is proud of the fact that he is dropped his cigarette smoking from 2 packs daily to 1 and a half packs daily. CV:  + tachycardia.  No palpitations, no LE edema. No chest pain GU:  No hematuria, no frequency GI:  Per HPI Heme:  Bruises easily but has not had any significant bleeding from lacerations. Doesn't have any significant large hematomas.   Transfusions:  Transfused with FFP in 2015 Neuro:  Poor balance.  No headaches, no peripheral tingling or numbness Derm:  Has been followed by a wound care clinic for a nonhealing wound on his left lower leg  Endocrine:  No sweats or chills.  No polyuria or dysuria Immunization:  Did not inquire about recent vaccinations Travel:  None beyond local counties in last few months.    PHYSICAL EXAM: Vital signs in last 24 hours: Vitals:   07/02/16 1250 07/02/16 1437  BP: 102/60 (!) 125/47  Pulse: 61  (!) 58  Resp: 11 13  Temp: 97.8 F (36.6 C)    Wt Readings from Last 3 Encounters:  10/18/15 82.1 kg (181 lb)  03/07/15 81.4 kg (179 lb 6.4 oz)  02/03/15 79.9 kg (176 lb 1.9 oz)    General: Elderly man. Does not look 83 years old. He is comfortable. He is hard of hearing. He doesn't look ill. Head:  No signs of head trauma. No asymmetry. No edema.  Eyes:  No scleral icterus.  Conjunctiva is pale. Ears:  Hard of hearing.  Nose:  No discharge or congestion. Mouth:  Oropharynx is moist and clear. Neck:  No JVD, no thyromegaly, no masses. Lungs:  Remarkably clear with good breath sounds. No cough. No labored breathing. Heart: RRR. No MRG. S1, S2 audible. Abdomen:  Not tender or distended. No organomegaly. Active bowel sounds. No bruits..   Rectal: Exam deferred. Stool was dark and FOBT positive earlier today.   Musc/Skeltl: No joint erythema or swelling. Extremities:  Gauze wrapping around the mid left lower leg was not removed to inspect the ulcer.  Neurologic:  Patient is alert. Would finding difficulty but not acutely confused. Knows that date, knows where he is, oriented to situation. Moves all 4 limbs. No tremor.   Intake/Output from previous day: No intake/output data recorded. Intake/Output this shift: No intake/output data recorded.  LAB RESULTS:  Recent Labs  07/02/16 1255  WBC 10.7*  HGB 6.5*  HCT 19.4*  PLT 96*   BMET Lab Results  Component Value Date   NA 139 07/02/2016   NA 141 10/18/2015   NA 141 02/10/2015   K 4.3 07/02/2016   K 4.2 10/18/2015   K 4.2 02/10/2015   CL 105 07/02/2016   CL 105 10/18/2015   CL 99 02/10/2015   CO2 24 07/02/2016   CO2 27 10/18/2015   CO2 33 (H) 02/10/2015   GLUCOSE 295 (H) 07/02/2016   GLUCOSE 239 (H) 10/18/2015   GLUCOSE 182 (H) 02/10/2015   BUN 64 (H) 07/02/2016   BUN 23 10/18/2015   BUN 32 (H) 02/10/2015   CREATININE 2.20 (H) 07/02/2016   CREATININE 1.50 (H) 10/18/2015   CREATININE 1.44 (H) 02/10/2015    CALCIUM 8.6 (L) 07/02/2016   CALCIUM 8.8 10/18/2015   CALCIUM 9.4 02/10/2015   LFT  Recent Labs  07/02/16 1255  PROT 5.5*  ALBUMIN 3.0*  AST 22  ALT 18  ALKPHOS 49  BILITOT 0.4   PT/INR Lab Results  Component Value Date   INR 3.13 07/02/2016   INR 1.64 (H) 12/16/2013   INR 1.46 12/15/2013   Hepatitis Panel No results for input(s): HEPBSAG, HCVAB, HEPAIGM, HEPBIGM in the last 72 hours. C-Diff No components found for: CDIFF Lipase     Component Value Date/Time   LIPASE 17 07/02/2016 1255    Drugs of Abuse  No results found for: LABOPIA, COCAINSCRNUR, LABBENZ, AMPHETMU, THCU, LABBARB   RADIOLOGY STUDIES: No results found.    IMPRESSION:   *  Painless hematochezia.  Suspect diverticular bleed. Documented diverticulosis on previous colonoscopies.  Bleeding has slowed down.  *  Symptomatic anemia. 2 units of red cells have been ordered. Blood loss contributing but he also has progressive stage 3-4 CKD which is likely playing a role.  *  Chronic thrombocytopenia.  *  Chronic Coumadin with INR 3.1. This is on hold. No vitamin K or FFP has been administered or is ordered.  *  Extensive peripheral vascular disease status post iliac and renal artery stents.  *  Status post bovine AVR.  *  IDDM.  Currently with not well controlled glucose.   PLAN:     *  Transfused blood as ordered. Recheck Hgb after transfusion. If he continues to bleed, would order a nuclear medicine tagged RBC scan.  With his seriously compromised kidneys, I'm not sure he is a candidate for arteriography/embolization should the RBC scan turn out positive.  *  As this is a lower GI bleed I am going to allow him to have meals of clear liquids.  *  Hold Coumadin. Consider giving low-dose vitamin K for more rapid reversal of INR  [name]  07/02/2016, 2:51 PM Pager: [phone]  GI ATTENDING  History, laboratories, relevant x-rays, prior endoscopy reports reviewed. Patient personally seen  and examined. His son is in the room. Agree with comprehensive consultation note as outlined above. Elderly gentleman who presents with painless hematochezia. Local history most consistent with diverticular bleed. Last colonoscopy with diverticulosis. Elevated INR secondary to Coumadin. Bleeding seems to have stopped. May have acute on chronic anemia. Agree with supportive care. Let INR drift. Corrective acute bleeding recurs. Monitor blood counts. Transfuse as needed. For recurrent acute bleeding could consider tag red blood cell scan for localization. Angiography may not be feasible given renal issues. Could consider colonoscopy if bleeding persists. His prescribing physician will need to decide on the risk-benefit ratio chronic anticoagulation move forward. Discussed with patient and son in detail. Will allow clear liquids. We'll follow with you. Thank you  [name], M.D. [facility] Division of Gastroenterology

## 2016-07-02 NOTE — ED Notes (Signed)
Got pt in gown and on monitor.

## 2016-07-03 DIAGNOSIS — K5791 Diverticulosis of intestine, part unspecified, without perforation or abscess with bleeding: Principal | ICD-10-CM

## 2016-07-03 LAB — BPAM RBC
Blood Product Expiration Date: 201805312359
Blood Product Expiration Date: 201805312359
ISSUE DATE / TIME: 201805082006
ISSUE DATE / TIME: 201805081519
UNIT TYPE AND RH: 5100
Unit Type and Rh: 5100

## 2016-07-03 LAB — TYPE AND SCREEN
ABO/RH(D): O POS
ANTIBODY SCREEN: NEGATIVE
UNIT DIVISION: 0
Unit division: 0

## 2016-07-03 LAB — HEMOGLOBIN AND HEMATOCRIT, BLOOD
HCT: 23.1 % — ABNORMAL LOW (ref 39.0–52.0)
HEMATOCRIT: 24.2 % — AB (ref 39.0–52.0)
HEMOGLOBIN: 8.2 g/dL — AB (ref 13.0–17.0)
Hemoglobin: 7.8 g/dL — ABNORMAL LOW (ref 13.0–17.0)

## 2016-07-03 LAB — GLUCOSE, CAPILLARY
GLUCOSE-CAPILLARY: 115 mg/dL — AB (ref 65–99)
GLUCOSE-CAPILLARY: 283 mg/dL — AB (ref 65–99)
GLUCOSE-CAPILLARY: 124 mg/dL — AB (ref 65–99)
Glucose-Capillary: 48 mg/dL — ABNORMAL LOW (ref 65–99)
Glucose-Capillary: 60 mg/dL — ABNORMAL LOW (ref 65–99)
Glucose-Capillary: 175 mg/dL — ABNORMAL HIGH (ref 65–99)

## 2016-07-03 LAB — PROTIME-INR
INR: 3.03
PROTHROMBIN TIME: 32 s — AB (ref 11.4–15.2)

## 2016-07-03 LAB — BASIC METABOLIC PANEL
Anion gap: 8 (ref 5–15)
BUN: 58 mg/dL — ABNORMAL HIGH (ref 6–20)
CALCIUM: 8.2 mg/dL — AB (ref 8.9–10.3)
CO2: 24 mmol/L (ref 22–32)
CREATININE: 1.86 mg/dL — AB (ref 0.61–1.24)
Chloride: 108 mmol/L (ref 101–111)
GFR calc Af Amer: 35 mL/min — ABNORMAL LOW (ref >60)
GFR calc non Af Amer: 30 mL/min — ABNORMAL LOW (ref >60)
GLUCOSE: 64 mg/dL — AB (ref 65–99)
Potassium: 2.9 mmol/L — ABNORMAL LOW (ref 3.5–5.1)
Sodium: 140 mmol/L (ref 135–145)

## 2016-07-03 LAB — LACTIC ACID, PLASMA: Lactic Acid, Venous: 1.2 mmol/L (ref 0.5–1.9)

## 2016-07-03 LAB — CBC
HCT: 22.5 % — ABNORMAL LOW (ref 39.0–52.0)
Hemoglobin: 7.7 g/dL — ABNORMAL LOW (ref 13.0–17.0)
MCH: 29.3 pg (ref 26.0–34.0)
MCHC: 34.2 g/dL (ref 30.0–36.0)
MCV: 85.6 fL (ref 78.0–100.0)
PLATELETS: 80 10*3/uL — AB (ref 150–400)
RBC: 2.63 MIL/uL — AB (ref 4.22–5.81)
RDW: 16.1 % — ABNORMAL HIGH (ref 11.5–15.5)
WBC: 9.5 10*3/uL (ref 4.0–10.5)

## 2016-07-03 MED ORDER — POTASSIUM CHLORIDE CRYS ER 20 MEQ PO TBCR
40.0000 meq | EXTENDED_RELEASE_TABLET | ORAL | Status: AC
  Administered 2016-07-03 (×2): 40 meq via ORAL
  Filled 2016-07-03 (×2): qty 2

## 2016-07-03 NOTE — Progress Notes (Signed)
[facility] Progress Note  Patient: Ronald [name] EXH:[id number]   PCP: [name], MD DOB: [birth year]   DOA: 07/02/2016   DOS: 07/03/2016   Date of Service: the patient was seen and examined on 07/03/2016  Subjective: feeling better, Still has maroon color BM. No abdominal pain. No shortness of breath.  Brief hospital course: Pt. with PMH of CAD, AS S/P AVR A. fib, COPD, HTN, type II DM; admitted on 07/02/2016, presented with complaint of blood in the bowels, was found to have hematochezia from diverticular bleed. Currently further plan is conservative management and close monitoring.  Assessment and Plan: 1. Hematochezia. Diverticular bleed. Symptomatic anemia Hemoglobin stable, after 2 PRBC. on chronic Coumadin and home, has not been reversed at present. GI was consulted, conservative management recommended without any procedure. GI recommends outpatient follow-up, hold Coumadin for one week, check repeat CBC tomorrow.  2. CAD, aortic stenosis S/P bioprosthetic aortic valve replacement. Hold Coumadin at present.  3. Acute on chronic kidney disease stage III. Reason renal function 1.5, currently elevated likely due to hypovolemia. We'll continue to monitor.  4. Essential hypertension. History of A. fib. Continue beta blocker.  5. Type 2 diabetes mellitus. Hypoglycemia. Currently holding long-acting insulin and placing the patient only on sliding scale insulin.  6. Chronic diastolic CHF. On Lasix and metolazone at home. Currently holding them patient was given IV hydration because of lactic acidosis. We'll continue to monitor.  Diet: Cardiac diet carb modified DVT Prophylaxis: on therapeutic anticoagulation.  Advance goals of care discussion: Full code  Family Communication: no family was present at bedside, at the time of interview.  Disposition:  Discharge to home? 07/04/2016.  Consultants: gastroenterology  Procedures: PRBC  Antibiotics: Anti-infectives    None       Objective: Physical Exam: Vitals:   07/03/16 0539 07/03/16 0807 07/03/16 0901 07/03/16 1503  BP: (!) 130/39  (!) 123/39 (!) 121/45  Pulse: (!) 59  60 63  Resp: 16   16  Temp: 97.6 F (36.4 C)   97.8 F (36.6 C)  TempSrc: Oral   Oral  SpO2: 97% 98%  98%  Weight:      Height:        Intake/Output Summary (Last 24 hours) at 07/03/16 1735 Last data filed at 07/03/16 1459  Gross per 24 hour  Intake          3194.33 ml  Output              800 ml  Net          2394.33 ml   Filed Weights   07/02/16 1551  Weight: 80.8 kg (178 lb 2.1 oz)   General: Alert, Awake and Oriented to Time, Place and Person. Appear in mild distress, affect appropriate Eyes: PERRL, Conjunctiva normal ENT: Oral Mucosa clear moist. Neck: no JVD, no Abnormal Mass Or lumps Cardiovascular: S1 and S2 Present, aortic systolic Murmur, Respiratory: Bilateral Air entry equal and Decreased, no use of accessory muscle, Clear to Auscultation, no Crackles, no wheezes Abdomen: Bowel Sound present, Soft and no tenderness Skin: no redness, no Rash, no induration Extremities: no Pedal edema, no calf tenderness Neurologic: Grossly no focal neuro deficit. Bilaterally Equal motor strength  Data Reviewed: CBC:  Recent Labs Lab 07/02/16 1255 07/03/16 0203 07/03/16 0813  WBC 10.7* 9.5  --   NEUTROABS 8.4*  --   --   HGB 6.5* 7.7* 7.8*  HCT 19.4* 22.5* 23.1*  MCV 86.6 85.6  --   PLT  96* 80*  --    Basic Metabolic Panel:  Recent Labs Lab 07/02/16 1255 07/03/16 0203  NA 139 140  K 4.3 2.9*  CL 105 108  CO2 24 24  GLUCOSE 295* 64*  BUN 64* 58*  CREATININE 2.20* 1.86*  CALCIUM 8.6* 8.2*    Liver Function Tests:  Recent Labs Lab 07/02/16 1255  AST 22  ALT 18  ALKPHOS 49  BILITOT 0.4  PROT 5.5*  ALBUMIN 3.0*    Recent Labs Lab 07/02/16 1255  LIPASE 17   No results for input(s): AMMONIA in the last 168 hours. Coagulation Profile:  Recent Labs Lab 07/02/16 1255 07/03/16 0203    INR 3.13 3.03   Cardiac Enzymes: No results for input(s): CKTOTAL, CKMB, CKMBINDEX, TROPONINI in the last 168 hours. BNP (last 3 results) No results for input(s): PROBNP in the last 8760 hours. CBG:  Recent Labs Lab 07/03/16 0802 07/03/16 0841 07/03/16 0919 07/03/16 1238 07/03/16 1704  GLUCAP 48* 60* 115* 283* 124*   Studies: No results found.  Scheduled Meds: . buPROPion  150 mg Oral Daily  . fluticasone  1 spray Each Nare Daily  . insulin aspart  0-5 Units Subcutaneous QHS  . insulin aspart  0-9 Units Subcutaneous TID WC  . metoprolol succinate  50 mg Oral BID  . simvastatin  20 mg Oral q1800  . traMADol  50 mg Oral BID  . umeclidinium-vilanterol  1 puff Inhalation Daily   Continuous Infusions: PRN Meds: acetaminophen **OR** acetaminophen, HYDROcodone-acetaminophen, ondansetron **OR** ondansetron (ZOFRAN) IV  Time spent: 35 minutes  Author: Berle Mull, MD [facility] Pager: [phone] 07/03/2016 5:35 PM  If 7PM-7AM, please contact night-coverage at [email or web address], password [facility]

## 2016-07-03 NOTE — Consult Note (Signed)
   [facility] Inpatient Consult   07/03/2016  Ronald [name] [facility] 1922/03/15 [id number]     Patient screened for [place] Management services. Went to bedside to offer and explain [facility] Care Management program with patient and son in law.    Patient denies having transition of care, care coordination, disease management, disease monitoring, transportation, community resource, or pharmacy needs at this time. States "my daughter takes care of all of that".  Declined [facility] Care Management follow up. Accepted [facility] Care Management brochure with contact information to call in future if changes mind. Will make inpatient RNCM aware that patient declined [place] Management program services.  Marthenia Rolling, MSN-Ed, RN,BSN [facility] Liaison [phone]

## 2016-07-03 NOTE — Progress Notes (Signed)
Pt Hemoglobin 6.5, 2 units of RBC given, Hemoglobin 7.7, no new orders for CBC. Potassium 2.9. Baltazar Najjar, NP paged.

## 2016-07-03 NOTE — Progress Notes (Addendum)
Hypoglycemic Event  CBG: 48  Treatment: 15 GM carbohydrate snack  Symptoms: None  Follow-up CBG: Time: 0840 CBG Result: 60 Another 44 carb snack given  Follow up cbg 0919 up to 115   Possible Reasons for Event: Inadequate meal intake  Comments/MD notified:patel    Ronald [name]

## 2016-07-03 NOTE — Progress Notes (Signed)
Daily Rounding Note  07/03/2016, 9:37 AM  LOS: 1 day   SUBJECTIVE:   Chief complaint: no further bleeding or stools overnight.  Slept well.  No dyspnea but has not been out of bed.  Hungry, on clears currently.     OBJECTIVE:         Vital signs in last 24 hours:    Temp:  [97.5 F (36.4 C)-99.4 F (37.4 C)] 97.6 F (36.4 C) (05/09 0539) Pulse Rate:  [55-62] 60 (05/09 0901) Resp:  [11-18] 16 (05/09 0539) BP: (102-130)/(33-60) 123/39 (05/09 0901) SpO2:  [95 %-100 %] 98 % (05/09 0807) Weight:  [80.8 kg (178 lb 2.1 oz)] 80.8 kg (178 lb 2.1 oz) (05/08 1551) Last BM Date: 07/02/16 Filed Weights   07/02/16 1551  Weight: 80.8 kg (178 lb 2.1 oz)   General: comfortable, not ill looking   Heart: sinus brady in 50s on monitor.   Chest: clear bil.   Abdomen: soft, NT, ND.  Active BS  Extremities: no CCE Neuro/Psych:  Oriented x 3, moves all 4 limbs.    Intake/Output from previous day: 05/08 0701 - 05/09 0700 In: 2591.4 [I.V.:1305.4; Blood:1286] Out: -   Intake/Output this shift: Total I/O In: -  Out: 350 [Urine:350]  Lab Results:  Recent Labs  07/02/16 1255 07/03/16 0203 07/03/16 0813  WBC 10.7* 9.5  --   HGB 6.5* 7.7* 7.8*  HCT 19.4* 22.5* 23.1*  PLT 96* 80*  --    BMET  Recent Labs  07/02/16 1255 07/03/16 0203  NA 139 140  K 4.3 2.9*  CL 105 108  CO2 24 24  GLUCOSE 295* 64*  BUN 64* 58*  CREATININE 2.20* 1.86*  CALCIUM 8.6* 8.2*   LFT  Recent Labs  07/02/16 1255  PROT 5.5*  ALBUMIN 3.0*  AST 22  ALT 18  ALKPHOS 49  BILITOT 0.4   PT/INR  Recent Labs  07/02/16 1255 07/03/16 0203  LABPROT 32.9* 32.0*  INR 3.13 3.03   Hepatitis Panel No results for input(s): HEPBSAG, HCVAB, HEPAIGM, HEPBIGM in the last 72 hours.  Studies/Results: No results found.  ASSESMENT:   *  Painless hematochezia.  Suspect diverticular bleed. Documented diverticulosis on previous colonoscopies.   Bleeding has slowed down.  *  Symptomatic anemia. s/p PRBC x 2 with 1.3 gm bump in Hgb.   Blood loss is likely primary cause but stage 3-4 CKD likely playing a role. Per Dr Keane Police notes: Hgb 06/2015 10.5, 08/2015 9.6, 12/2015 10.3.  MCV in 90s. So he is chronically anemic  *  Chronic Coumadin (on hold) with INR 3.1 >> 3.0.  No vitamin K or FFP thus far.  *  Extensive peripheral vascular disease status post iliac and renal artery stents.  Poorly healing left leg ulcer.   *  Chronic thrombocytopenia.  *  Status post bovine AVR.  *  IDDM.  Currently with not well controlled glucose.    PLAN   *  H&H this evening, CBC in AM.   Carb mod diet  *  Observe another night inpt.  Walk pt in halls.     [name]  07/03/2016, 9:37 AM Pager: [phone]  GI ATTENDING  Interval history data reviewed. Patient personally seen and examined. Agree with interval progress note as outlined above. Patient is doing well with presumed diverticular bleed and acute on chronic anemia. No bowel movements. Blood count stable. INR drifting. Agree with observation for an additional day.  If doing okay tomorrow he may be discharged home. Regular diet. Probably would hold Coumadin for 1 week and resume after bowel movements normal for several days. He should follow-up with his primary care provider in 1 week. GI follow-up as needed for recurrent problems or questions. Will sign off. Thank you  [name], M.D. [facility] Division of Gastroenterology

## 2016-07-04 LAB — CBC
HCT: 23.3 % — ABNORMAL LOW (ref 39.0–52.0)
Hemoglobin: 7.6 g/dL — ABNORMAL LOW (ref 13.0–17.0)
MCH: 28.3 pg (ref 26.0–34.0)
MCHC: 32.6 g/dL (ref 30.0–36.0)
MCV: 86.6 fL (ref 78.0–100.0)
Platelets: 89 10*3/uL — ABNORMAL LOW (ref 150–400)
RBC: 2.69 MIL/uL — AB (ref 4.22–5.81)
RDW: 16.6 % — AB (ref 11.5–15.5)
WBC: 8.9 10*3/uL (ref 4.0–10.5)

## 2016-07-04 LAB — GLUCOSE, CAPILLARY
Glucose-Capillary: 142 mg/dL — ABNORMAL HIGH (ref 65–99)
Glucose-Capillary: 194 mg/dL — ABNORMAL HIGH (ref 65–99)

## 2016-07-04 MED ORDER — FUROSEMIDE 40 MG PO TABS: 40.0000 mg | ORAL_TABLET | Freq: Every day | ORAL | 0 refills | Status: DC

## 2016-07-04 MED ORDER — FERROUS SULFATE 325 (65 FE) MG PO TBEC: 325.0000 mg | DELAYED_RELEASE_TABLET | Freq: Two times a day (BID) | ORAL | 0 refills | Status: AC

## 2016-07-04 NOTE — Progress Notes (Signed)
Pt dc'd home via wheelchair. Son to drive home. IV and telemetry removed. All belongings and discharge paperwork given to pt. All questions answered. Pt AxO and VSs stable.

## 2016-07-04 NOTE — Evaluation (Addendum)
Physical Therapy Evaluation Patient Details Name: Ronald [name] MRN: [id number] DOB: 08-25-1922 Today's Date: 07/04/2016   History of Present Illness  83 y.o. male with h/o ORIF L femur 2015, CAD, AS S/P AVR A. fib, COPD, HTN, type II DM; admitted on 07/02/2016, presented with complaint of blood in the bowels, was found to have hematochezia from diverticular bleed.  Clinical Impression  Pt ambulated 120' with RW without loss of balance, distance limited by BLE fatigue, which is baseline. He walks short distances and uses motorized scooter for longer distances. Pt appears to be near baseline with mobility. Encouraged pt to attempt to work in several more short walks each day to improve his endurance. He mentioned chronic low back pain with walking, 5/10 intensity, which is relieved with seated rest. He denies radiating pain, encouraged pt to discuss this with his primary care physician. He reports h/o "breaking his back" in 1990s, for which he wore a brace, no h/o back surgery. From PT standpoint, he is ready to DC home, no further PT indicated as pt is independent with mobility.     Follow Up Recommendations No PT follow up    Equipment Recommendations  None recommended by PT    Recommendations for Other Services       Precautions / Restrictions Precautions Precautions: None Precaution Comments: denies falls in past 1 year Restrictions Weight Bearing Restrictions: No      Mobility  Bed Mobility Overal bed mobility: Independent                Transfers Overall transfer level: Modified independent               General transfer comment: VCs for hand placement  Ambulation/Gait Ambulation/Gait assistance: Supervision Ambulation Distance (Feet): 120 Feet Assistive device: Rolling walker (2 wheeled) Gait Pattern/deviations: Step-through pattern;Decreased stride length;Trunk flexed   Gait velocity interpretation: at or above normal speed for age/gender General Gait  Details: VCs for positioning in RW, no LOB, distance limited by BLE fatigue, pt walks short distances only at baseline, uses scooter for long distances  Stairs            Wheelchair Mobility    Modified Rankin (Stroke Patients Only)       Balance Overall balance assessment: Modified Independent                                           Pertinent Vitals/Pain Pain Assessment: 0-10 Pain Score: 5  Pain Location: chronic low back with walking, no radiating pain, relieved with rest Pain Descriptors / Indicators: Sore Pain Intervention(s): Limited activity within patient's tolerance;Monitored during session    Home Living Family/patient expects to be discharged to:: Private residence Living Arrangements: Alone   Type of Home: House Home Access: Stairs to enter Entrance Stairs-Rails: Psychiatric nurse of Steps: 1 STE, stair lift to basement Home Layout: One level Home Equipment: Walker - 2 wheels;Walker - 4 wheels;Cane - single point;Electric scooter;Grab bars - tub/shower Additional Comments: plans to DC home. Family can assist prn. 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 Uses scooter primarily, walks short distances with SPC.     Prior Function Level of Independence: Independent with assistive device(s)         Comments: drives to work M-F     Hand Dominance   Dominant Hand: Right    Extremity/Trunk Assessment  Upper Extremity Assessment Upper Extremity Assessment: Overall WFL for tasks assessed    Lower Extremity Assessment Lower Extremity Assessment: Overall WFL for tasks assessed (h/o periperal neuropathy BLEs, decr sensation to light touch B feet)    Cervical / Trunk Assessment Cervical / Trunk Assessment: Normal  Communication   Communication: HOH  Cognition Arousal/Alertness: Awake/alert Behavior During Therapy: WFL for tasks assessed/performed Overall Cognitive Status: Within Functional Limits  for tasks assessed                                        General Comments      Exercises     Assessment/Plan    PT Assessment Patent does not need any further PT services  PT Problem List         PT Treatment Interventions      PT Goals (Current goals can be found in the Care Plan section)  Acute Rehab PT Goals Patient Stated Goal: pt wants to try to fit in more exercise in his daily routine, encouraged pt to walk short distances more frequently as he tends to use his scooter bc its faster than walking PT Goal Formulation: All assessment and education complete, DC therapy    Frequency     Barriers to discharge        Co-evaluation               AM-PAC PT "6 Clicks" Daily Activity  Outcome Measure Difficulty turning over in bed (including adjusting bedclothes, sheets and blankets)?: None Difficulty moving from lying on back to sitting on the side of the bed? : None Difficulty sitting down on and standing up from a chair with arms (e.g., wheelchair, bedside commode, etc,.)?: None Help needed moving to and from a bed to chair (including a wheelchair)?: None Help needed walking in hospital room?: None Help needed climbing 3-5 steps with a railing? : A Little 6 Click Score: 23    End of Session Equipment Utilized During Treatment: Gait belt Activity Tolerance: Patient limited by fatigue;Patient tolerated treatment well Patient left: in chair;with call bell/phone within reach;with family/visitor present Nurse Communication: Mobility status      Time: 1540-0867 PT Time Calculation (min) (ACUTE ONLY): 30 min   Charges:   PT Evaluation $PT Eval Low Complexity: 1 Procedure PT Treatments $Gait Training: 8-22 mins   PT G Codes:          Philomena Doheny 07/04/2016, 10:42 AM [phone]

## 2016-07-05 DIAGNOSIS — F329 Major depressive disorder, single episode, unspecified: Secondary | ICD-10-CM | POA: Diagnosis not present

## 2016-07-05 DIAGNOSIS — I70242 Atherosclerosis of native arteries of left leg with ulceration of calf: Secondary | ICD-10-CM | POA: Diagnosis not present

## 2016-07-05 DIAGNOSIS — H409 Unspecified glaucoma: Secondary | ICD-10-CM | POA: Diagnosis not present

## 2016-07-05 DIAGNOSIS — I482 Chronic atrial fibrillation: Secondary | ICD-10-CM | POA: Diagnosis not present

## 2016-07-05 DIAGNOSIS — J449 Chronic obstructive pulmonary disease, unspecified: Secondary | ICD-10-CM | POA: Diagnosis not present

## 2016-07-05 DIAGNOSIS — E1140 Type 2 diabetes mellitus with diabetic neuropathy, unspecified: Secondary | ICD-10-CM | POA: Diagnosis not present

## 2016-07-05 DIAGNOSIS — M1990 Unspecified osteoarthritis, unspecified site: Secondary | ICD-10-CM | POA: Diagnosis not present

## 2016-07-05 DIAGNOSIS — L97822 Non-pressure chronic ulcer of other part of left lower leg with fat layer exposed: Secondary | ICD-10-CM | POA: Diagnosis not present

## 2016-07-05 DIAGNOSIS — I110 Hypertensive heart disease with heart failure: Secondary | ICD-10-CM | POA: Diagnosis not present

## 2016-07-05 DIAGNOSIS — L97821 Non-pressure chronic ulcer of other part of left lower leg limited to breakdown of skin: Secondary | ICD-10-CM | POA: Diagnosis not present

## 2016-07-05 DIAGNOSIS — E11622 Type 2 diabetes mellitus with other skin ulcer: Secondary | ICD-10-CM | POA: Diagnosis not present

## 2016-07-05 DIAGNOSIS — I5032 Chronic diastolic (congestive) heart failure: Secondary | ICD-10-CM | POA: Diagnosis not present

## 2016-07-05 DIAGNOSIS — Z794 Long term (current) use of insulin: Secondary | ICD-10-CM | POA: Diagnosis not present

## 2016-07-08 NOTE — Discharge Summary (Signed)
[facility] Discharge Summary   Patient: Ronald [name] IRW:[id number]   PCP: Ronald Baton, MD DOB: 10/25/1922   Date of admission: 07/02/2016   Date of discharge: 07/04/2016    Discharge Diagnoses:  Active Problems:   Essential hypertension   AS (aortic stenosis)- s/p tissue AVR 01/07/11   Chronic atrial fibrillation (HCC)   Chronic diastolic heart failure (HCC)   Chronic renal insufficiency, stage IV (severe) (HCC)   Symptomatic anemia   GI bleed   AKI (acute kidney injury) ([place])   Lactic acidemia   Lower GI bleed   Admitted From: home Disposition:  Home with family  Recommendations for Outpatient Follow-up:  1. Please follow up with PCP to get INR and CBC checked.  2. Discuss with PCP regarding coumadin continuation for a fib as has GI bleed.  Follow-up Information    Ronald Baton, MD. Schedule an appointment as soon as possible for a visit in 1 week(s).   Specialty:  Internal Medicine Why:  get INR and CBC checked in 1 week, need to deceide regarding long term anticoagulation.  Contact information: [address] [place] [address] [phone]        [facility]. Call.   Specialty:  Gastroenterology Why:  as needed.  Contact information: [place] [id number] [phone]         Diet recommendation: cardiac diet  Activity: The patient is advised to gradually reintroduce usual activities.  Discharge Condition: good  Code Status: full code  History of present illness: As per the H and P dictated on admission, "Ronald [name] is a 83 y.o. male with medical history significant of diabetes, thrombus cytopenia, renal artery stenosis, CAD, hypertension, PVD, COPD, depression, chronic diastolic congestive heart failure, bladder cancer, aortic stenosis status post bovine aortic valve replacement in 2012. Patient presenting with 1 day history of bright red blood per rectum. Patient described his bowel movements  as nothing but blood. Associated with generalized weakness. States that on the day prior to admission patient had a bowel movement approximately every 30 minutes after initial episode. That evening patient had a single dark bowel movement with fecal matter. On day of admission patient has only had one bloody bowel movement. Patient seen by primary care physician who sent patient to the emergency room for further evaluation.  Denies NSAID use, chest pain, palpitations, nausea, vomiting, abdominal pain, dysuria, fever, flank pain, neck stiffness, LOC, focal neurological deficit."  Hospital Course:  Summary of his active problems in the hospital is as following. 1. Hematochezia. Diverticular bleed. Symptomatic anemia Hemoglobin stable, after 2 PRBC. on chronic Coumadin and home, has not been reversed at present. GI was consulted, conservative management recommended without any procedure. GI recommends outpatient follow-up, hold Coumadin for one week, check repeat CBC in 1 week  2. CAD, aortic stenosis S/P bioprosthetic aortic valve replacement. Hold Coumadin at present.  3. Acute on chronic kidney disease stage III. Admission S creat 1.5, currently elevated likely due to hypovolemia. Getting better.  4. Essential hypertension. History of A. fib. Continue beta blocker. Hold coumadin for 1 week but follow up with PCP regarding continuation of it beyond 1 week due to presentation with GI bleed.   5. Type 2 diabetes mellitus. Hypoglycemia. Resume home regimen  6. Chronic diastolic CHF. On Lasix and metolazone at home. Lasix dose reduced, zaroxolyn stopped and lisinopril also stopped due to AKI and normal blood pressure as well as lack of volume overload. Follow up with PCP  for further adjustment.   All other chronic medical condition were stable during the hospitalization.  Patient was seen by physical therapy, who recommended no therapy On the day of the discharge the patient's  vitals were stable, and no other acute medical condition were reported by patient. the patient was felt safe to be discharge at home with family.  Procedures and Results:  PRBC trnsfusion   Consultations:  Gastroenterology  DISCHARGE MEDICATION: Discharge Medication List as of 07/04/2016  2:57 PM    START taking these medications   Details  ferrous sulfate 325 (65 FE) MG EC tablet Take 1 tablet (325 mg total) by mouth 2 (two) times daily., Starting Thu 07/04/2016, Until Fri 07/04/2017, Normal      CONTINUE these medications which have CHANGED   Details  furosemide (LASIX) 40 MG tablet Take 1 tablet (40 mg total) by mouth daily., Starting Thu 07/04/2016, Normal      CONTINUE these medications which have NOT CHANGED   Details  alendronate (FOSAMAX) 70 MG tablet Take 70 mg by mouth every 14 (fourteen) days. Take 1 tablet every other Sunday with a full glass of water on an empty stomach., Historical Med    buPROPion (WELLBUTRIN XL) 150 MG 24 hr tablet Take 150 mg by mouth daily., Historical Med    doxazosin (CARDURA) 4 MG tablet Take 4 mg by mouth every evening. , Until Discontinued, Historical Med    finasteride (PROSCAR) 5 MG tablet Take 5 mg by mouth daily. , Until Discontinued, Historical Med    fluticasone (FLONASE) 50 MCG/ACT nasal spray Place 1 spray into both nostrils 2 (two) times daily. , Historical Med    glyBURIDE (DIABETA) 5 MG tablet Take 2.5-10 mg by mouth 2 (two) times daily with a meal. 2 tablets in the morning and 1/2 tablet in the evening, Until Discontinued, Historical Med    Insulin Glargine (LANTUS) 100 UNIT/ML Solostar Pen Inject 24 Units into the skin daily. , Historical Med    loratadine (CLARITIN) 10 MG tablet Take 10 mg by mouth daily., Until Discontinued, Historical Med    metoprolol succinate (TOPROL-XL) 50 MG 24 hr tablet Take 50 mg by mouth 2 (two) times daily. Take with or immediately following a meal., Historical Med    OVER THE COUNTER MEDICATION  Take 1 capsule by mouth 2 (two) times daily. "Higher Thoughts", Historical Med    potassium chloride SA (K-DUR,KLOR-CON) 20 MEQ tablet Take 20 mEq by mouth daily., Until Discontinued, Historical Med    Salicylic Acid-Cleanser 6 % (Cream) KIT Apply topically. Used as directed, Historical Med    simvastatin (ZOCOR) 20 MG tablet Take 20 mg by mouth every evening., Until Discontinued, Historical Med    traMADol (ULTRAM) 50 MG tablet Take 50 mg by mouth every evening. , Historical Med    Umeclidinium-Vilanterol (ANORO ELLIPTA) 62.5-25 MCG/INH AEPB Inhale 1 puff into the lungs daily., Until Discontinued, Historical Med      STOP taking these medications     benazepril (LOTENSIN) 10 MG tablet      HYDROcodone-acetaminophen (NORCO/VICODIN) 5-325 MG per tablet      metFORMIN (GLUCOPHAGE) 500 MG tablet      metolazone (ZAROXOLYN) 2.5 MG tablet      warfarin (COUMADIN) 5 MG tablet        Allergies  Allergen Reactions  . Aspirin Anaphylaxis, Shortness Of Breath and Rash    "broke out in white welts; red blotches neck and face; windpipe closing; ~ 1962"   Discharge Instructions  Diet - low sodium heart healthy    Complete by:  As directed    Discharge instructions    Complete by:  As directed    It is important that you read following instructions as well as go over your medication list with RN to help you understand your care after this hospitalization.  Discharge Instructions: Please follow-up with PCP in one week  Please request your primary care physician to go over all Hospital Tests and Procedure/Radiological results at the follow up,  Please get all Hospital records sent to your PCP by signing hospital release before you go home.   Do not take more than prescribed Pain, Sleep and Anxiety Medications. You were cared for by a hospitalist during your hospital stay. If you have any questions about your discharge medications or the care you received while you were in the hospital  after you are discharged, you can call the unit and ask to speak with the hospitalist on call if the hospitalist that took care of you is not available.  Once you are discharged, your primary care physician will handle any further medical issues. Please note that NO REFILLS for any discharge medications will be authorized once you are discharged, as it is imperative that you return to your primary care physician (or establish a relationship with a primary care physician if you do not have one) for your aftercare needs so that they can reassess your need for medications and monitor your lab values. You Must read complete instructions/literature along with all the possible adverse reactions/side effects for all the Medicines you take and that have been prescribed to you. Take any new Medicines after you have completely understood and accept all the possible adverse reactions/side effects. Wear Seat belts while driving. If you have smoked or chewed Tobacco in the last 2 yrs please stop smoking and/or stop any Recreational drug use.   Increase activity slowly    Complete by:  As directed      Discharge Exam: Filed Weights   07/02/16 1551  Weight: 80.8 kg (178 lb 2.1 oz)   Vitals:   07/04/16 0512 07/04/16 0928  BP: (!) 135/46   Pulse: 62 65  Resp: 17 18  Temp: 98.1 F (36.7 C)    General: Appear in no distress, no Rash; Oral Mucosa clear. Cardiovascular: S1 and S2 Present, aortic systolic Murmur, no JVD Respiratory: Bilateral Air entry present and Clear to Auscultation, no Crackles, no wheezes Abdomen: Bowel Sound present, Soft and no tenderness Extremities: no Pedal edema, no calf tenderness Neurology: Grossly no focal neuro deficit.  The results of significant diagnostics from this hospitalization (including imaging, microbiology, ancillary and laboratory) are listed below for reference.    Significant Diagnostic Studies: No results found.  Microbiology: No results found for this or  any previous visit (from the past 240 hour(s)).   Labs: CBC:  Recent Labs Lab 07/02/16 1255 07/03/16 0203 07/03/16 0813 07/03/16 1737 07/04/16 0533  WBC 10.7* 9.5  --   --  8.9  NEUTROABS 8.4*  --   --   --   --   HGB 6.5* 7.7* 7.8* 8.2* 7.6*  HCT 19.4* 22.5* 23.1* 24.2* 23.3*  MCV 86.6 85.6  --   --  86.6  PLT 96* 80*  --   --  89*   Basic Metabolic Panel:  Recent Labs Lab 07/02/16 1255 07/03/16 0203  NA 139 140  K 4.3 2.9*  CL 105 108  CO2 24 24  GLUCOSE  295* 64*  BUN 64* 58*  CREATININE 2.20* 1.86*  CALCIUM 8.6* 8.2*   Liver Function Tests:  Recent Labs Lab 07/02/16 1255  AST 22  ALT 18  ALKPHOS 49  BILITOT 0.4  PROT 5.5*  ALBUMIN 3.0*    Recent Labs Lab 07/02/16 1255  LIPASE 17   No results for input(s): AMMONIA in the last 168 hours. Cardiac Enzymes: No results for input(s): CKTOTAL, CKMB, CKMBINDEX, TROPONINI in the last 168 hours. BNP (last 3 results) No results for input(s): BNP in the last 8760 hours. CBG:  Recent Labs Lab 07/03/16 1238 07/03/16 1704 07/03/16 2144 07/04/16 0820 07/04/16 1232  GLUCAP 283* 124* 175* 142* 194*   Time spent: 35 minutes  Signed:  PATEL, PRANAV  [facility] 07/04/2016   , 2:09 PM

## 2016-07-11 DIAGNOSIS — Z7901 Long term (current) use of anticoagulants: Secondary | ICD-10-CM | POA: Diagnosis not present

## 2016-07-11 DIAGNOSIS — D500 Iron deficiency anemia secondary to blood loss (chronic): Secondary | ICD-10-CM | POA: Diagnosis not present

## 2016-07-11 DIAGNOSIS — I4891 Unspecified atrial fibrillation: Secondary | ICD-10-CM | POA: Diagnosis not present

## 2016-07-11 DIAGNOSIS — K922 Gastrointestinal hemorrhage, unspecified: Secondary | ICD-10-CM | POA: Diagnosis not present

## 2016-07-12 DIAGNOSIS — L97822 Non-pressure chronic ulcer of other part of left lower leg with fat layer exposed: Secondary | ICD-10-CM | POA: Diagnosis not present

## 2016-07-12 DIAGNOSIS — E11622 Type 2 diabetes mellitus with other skin ulcer: Secondary | ICD-10-CM | POA: Diagnosis not present

## 2016-07-12 DIAGNOSIS — I872 Venous insufficiency (chronic) (peripheral): Secondary | ICD-10-CM | POA: Diagnosis not present

## 2016-07-12 NOTE — ED Provider Notes (Signed)
[facility] DEPT Provider Note   CSN: [id number] Arrival date & time: 07/02/16  1233  LATE NOTE COMPLETION - COMPUTER ERROR   History   Chief Complaint Chief Complaint  Patient presents with  . Rectal Bleeding    HPI Ronald [name] is a 83 y.o. male.  HPI   Patient presents with weakness, lightheadedness, possible episode of syncope, as well as concern for ongoing bright red blood per rectum. Patient acknowledges multiple medical issues including chronic kidney disease, aortic stenosis, atrial fibrillation for which he takes Coumadin. Over the past 2 or 3 days he has had multiple episodes of bright red blood per rectum this is diminished today, he has increasing lightheadedness, generalized weakness during this time course, swollen episode of what sounds like syncope, when standing up today. Currently the patient denies chest pain, does describe generalized weakness. No recent medication changes, diet changes. Patient with his physicians, was referred here.  Past Medical History:  Diagnosis Date  . AS (aortic stenosis)    bovine aortic valve replacement 01/07/11  . Bladder cancer [facility])    Bladder Cancer local  . Blood transfusion    w/hip operation  . Cellulitis of left lower extremity   . Chronic atrial fibrillation ([place])   . Chronic diastolic congestive heart failure ([place])   . Claudication in peripheral vascular disease ([place]) 06/17/2011  . COPD (chronic obstructive pulmonary disease) ([place])   . Depression wife died 4 years ago.    Marland Kitchen History of stomach ulcers ~ 1951  . HTN (hypertension)   . Neuropathy due to secondary diabetes ([place])   . Normal coronary arteries Sept 2012  . Peripheral vascular disease ([place]) very poor circulation legs and feet ... stents right and left legs... done in dr j. Gwenlyn Found 's office.   . Pneumonia 07/25/11   left  . Recurrent upper respiratory infection (URI)    sinusitis  . Renal artery stenosis ([place]) 2006   renal artery stent  . S/P  angioplasty with stent, diamond back rotational athrectomy Prox. Rt. SFA 06/17/2011 06/17/2011  . Shortness of breath   . Thrombocytopenia due to drugs    seen by Dr Inda Merlin plts [id number] no rx  . Type II diabetes mellitus [facility])     Patient Active Problem List   Diagnosis Date Noted  . GI bleed 07/02/2016  . AKI (acute kidney injury) ([place]) 07/02/2016  . Lactic acidemia 07/02/2016  . Lower GI bleed   . Tobacco use 03/07/2015  . Diabetes mellitus type 2, uncontrolled ([place]) 12/20/2013  . Acute blood loss anemia 12/20/2013  . Allergic rhinitis 12/20/2013  . Intertrochanteric fracture of left femur ([place]) 12/12/2013  . Symptomatic anemia 12/12/2013  . Right acetabular fracture ([place]) 04/11/2013  . Acetabular fracture ([place]) 04/11/2013  . Chronic renal insufficiency, stage IV (severe) ([place]) 12/24/2012  . Bilateral claudication of lower limb ([place]) 12/24/2012  . Cellulitis 12/02/2012  . History of tobacco abuse- 75 years, quit in Feb 2014 07/06/2012  . Chronic diastolic heart failure ([place]) 07/06/2012  . COPD (chronic obstructive pulmonary disease) ([place]) 05/27/2012  . Pulmonary nodules 05/27/2012  . Normal coronary arteries, cath 11/12 07/25/2011  . Normal left ventricular systolic function, Echo 7/67 07/25/2011  . Closed right hip fracture, ORIF 04/28/17 complicated by gluteal hematoma while being Coumadinized post op 06/27/2011  . Aspirin allergy, rash, SOB. Pt is on Plavix 06/19/2011  . Chronic atrial fibrillation ([place]) 06/19/2011  . Thrombocytopenia ([place]) 01/11/2011  . Leukocytosis 01/11/2011  . S/P aortic valve replacement: #23  Magna Ease Edwards Pericardial Valve  November 2012 01/08/2011  . AS (aortic stenosis)- s/p tissue AVR 01/07/11   . CAROTID BRUIT- moderate ICA disease 5/14 05/31/2008  . Diabetes mellitus with complication ([place]) 27/04/5007  . Hyperlipidemia 07/11/2007  . DEPRESSION 07/11/2007  . RESTLESS LEGS SYNDROME 07/11/2007  . Essential hypertension 07/11/2007  . PVD, Rt  SFA PTA/HSRA 06/17/11 07/11/2007  . DIVERTICULOSIS, COLON 07/11/2007  . RENAL CYST 07/11/2007  . ARTHRITIS 07/11/2007  . SKIN CANCER, HX OF 07/11/2007  . RHEUMATIC HEART DISEASE, HX OF 07/11/2007    Past Surgical History:  Procedure Laterality Date  . ABDOMINAL ANGIOGRAM  06/17/2011   Procedure: ABDOMINAL ANGIOGRAM;  Surgeon: Lorretta Harp, MD;  Location: [facility] CATH LAB;  Service: Cardiovascular;;  . AORTIC VALVE REPLACEMENT  01/07/2011   Procedure: AORTIC VALVE REPLACEMENT (AVR);  Surgeon: Grace Isaac, MD;  Location: [facility];  Service: Open Heart Surgery;  Laterality: N/A;; magna-ease bovine 71m bioprosthesis  . ATHERECTOMY N/A 06/17/2011   Procedure: ATHERECTOMY;  Surgeon: JLorretta Harp MD;  [facility];  Service: Cardiovascular;  Laterality: N/A;  . CARDIAC CATHETERIZATION  11/19/10   normal coronaries, mod AS, 75% l RAS  . CARDIOVERSION  04/03/2011   Procedure: CARDIOVERSION;  Surgeon: DLeonie Man MD;  [facility]  Service: Cardiovascular;  Laterality: N/A;  . CATARACT EXTRACTION W/ INTRAOCULAR LENS  IMPLANT, BILATERAL  ~ 2007  . CHOLECYSTECTOMY    . FEMUR IM NAIL  06/27/2011   Procedure: INTRAMEDULLARY (IM) NAIL FEMORAL;  Surgeon: KMarin Shutter MD;  Location: [facility];  Service: Orthopedics;  Laterality: Right;  . FEMUR IM NAIL Left 12/14/2013   Procedure: INTRAMEDULLARY (IM) NAIL FEMORAL;  Surgeon: MMauri Pole MD;  Location: [facility] OR[facility];  Service: Orthopedics;  Laterality: Left;  . LOWER EXTREMITY ANGIOGRAM  06/17/2011   diamondback orbital rotational and cutting balloon atherectomy of the prox R SFA  . LOWER EXTREMITY ANGIOGRAM Bilateral 06/17/2011   Procedure: LOWER EXTREMITY ANGIOGRAM;  Surgeon: JLorretta Harp MD;  [facility];  Service: Cardiovascular;  Laterality: Bilateral;  . PERIPHERAL ARTERIAL STENT GRAFT     2006 left anf right illiac stents Dr BDeon Pilling . RENAL ARTERY STENT  2006   "I believe"  . TONSILLECTOMY AND ADENOIDECTOMY      "when I was a kid"       Home Medications    Prior to Admission medications   Medication Sig Start Date End Date Taking? Authorizing Provider  alendronate (FOSAMAX) 70 MG tablet Take 70 mg by mouth every 14 (fourteen) days. Take 1 tablet every other Sunday with a full glass of water on an empty stomach.   Yes [provider]  buPROPion (WELLBUTRIN XL) 150 MG 24 hr tablet Take 150 mg by mouth daily.   Yes [provider]  doxazosin (CARDURA) 4 MG tablet Take 4 mg by mouth every evening.    Yes Collins, Gina L, PA-C  finasteride (PROSCAR) 5 MG tablet Take 5 mg by mouth daily.    Yes [provider]  fluticasone (FLONASE) 50 MCG/ACT nasal spray Place 1 spray into both nostrils 2 (two) times daily.    Yes [provider]  glyBURIDE (DIABETA) 5 MG tablet Take 2.5-10 mg by mouth 2 (two) times daily with a meal. 2 tablets in the morning and 1/2 tablet in the evening   Yes [provider]  Insulin Glargine (LANTUS) 100 UNIT/ML Solostar Pen Inject 24 Units into the skin  daily.    Yes [provider]  loratadine (CLARITIN) 10 MG tablet Take 10 mg by mouth daily.   Yes [provider]  metoprolol succinate (TOPROL-XL) 50 MG 24 hr tablet Take 50 mg by mouth 2 (two) times daily. Take with or immediately following a meal.   Yes [provider]  OVER THE COUNTER MEDICATION Take 1 capsule by mouth 2 (two) times daily. "Higher Thoughts"   Yes [provider]  potassium chloride SA (K-DUR,KLOR-CON) 20 MEQ tablet Take 20 mEq by mouth daily.   Yes [provider]  Salicylic Acid-Cleanser 6 % (Cream) KIT Apply topically. Used as directed   Yes [provider]  simvastatin (ZOCOR) 20 MG tablet Take 20 mg by mouth every evening.   Yes [provider]  traMADol (ULTRAM) 50 MG tablet Take 50 mg by mouth every evening.    Yes [provider]  Umeclidinium-Vilanterol (ANORO ELLIPTA) 62.5-25 MCG/INH AEPB  Inhale 1 puff into the lungs daily.   Yes [provider]  ferrous sulfate 325 (65 FE) MG EC tablet Take 1 tablet (325 mg total) by mouth 2 (two) times daily. 07/04/16 07/04/17  Lavina Hamman, MD  furosemide (LASIX) 40 MG tablet Take 1 tablet (40 mg total) by mouth daily. 07/04/16   Lavina Hamman, MD    Family History Family History  Problem Relation Age of Onset  . Heart disease Mother   . Cancer Brother        colon  . Stroke Father     Social History Social History  Substance Use Topics  . Smoking status: Current Every Day Smoker    Packs/day: 1.50    Years: 75.00    Types: Cigarettes  . Smokeless tobacco: Never Used  . Alcohol use No     Allergies   Aspirin   Review of Systems Review of Systems  Constitutional:       Per HPI, otherwise negative  HENT:       Per HPI, otherwise negative  Respiratory:       Per HPI, otherwise negative  Cardiovascular:       Per HPI, otherwise negative  Gastrointestinal: Negative for vomiting.  Endocrine:       Negative aside from HPI  Genitourinary:       Neg aside from HPI   Musculoskeletal:       Per HPI, otherwise negative  Skin: Negative.   Neurological: Positive for syncope and weakness.     Physical Exam Updated Vital Signs BP (!) 135/46 (BP Location: Right Arm)   Pulse 65   Temp 98.1 F (36.7 C) (Oral)   Resp 18   Ht '5\' 11"'$  (1.803 m)   Wt 178 lb 2.1 oz (80.8 kg)   SpO2 97%   BMI 24.84 kg/m   Physical Exam  Constitutional: He is oriented to person, place, and time. He appears well-developed. No distress.  HENT:  Head: Normocephalic and atraumatic.  Eyes: Conjunctivae and EOM are normal.  Cardiovascular: Normal rate and regular rhythm.   Pulmonary/Chest: Effort normal. No stridor. No respiratory distress.  Abdominal: He exhibits no distension. There is no tenderness.  Musculoskeletal: He exhibits no edema.  Neurological: He is alert and oriented to person, place, and time.  Skin: Skin is warm  and dry. There is pallor.  Psychiatric: He has a normal mood and affect.  Nursing note and vitals reviewed.    ED Treatments / Results  Labs (all labs ordered are listed,  but only abnormal results are displayed) Labs Reviewed  CBC WITH DIFFERENTIAL/PLATELET - Abnormal; Notable for the following:       Result Value   WBC 10.7 (*)    RBC 2.24 (*)    Hemoglobin 6.5 (*)    HCT 19.4 (*)    RDW 17.3 (*)    Platelets 96 (*)    Neutro Abs 8.4 (*)    All other components within normal limits  COMPREHENSIVE METABOLIC PANEL - Abnormal; Notable for the following:    Glucose, Bld 295 (*)    BUN 64 (*)    Creatinine, Ser 2.20 (*)    Calcium 8.6 (*)    Total Protein 5.5 (*)    Albumin 3.0 (*)    GFR calc non Af Amer 24 (*)    GFR calc Af Amer 28 (*)    All other components within normal limits  PROTIME-INR - Abnormal; Notable for the following:    Prothrombin Time 32.9 (*)    All other components within normal limits  CBC - Abnormal; Notable for the following:    RBC 2.63 (*)    Hemoglobin 7.7 (*)    HCT 22.5 (*)    RDW 16.1 (*)    Platelets 80 (*)    All other components within normal limits  PROTIME-INR - Abnormal; Notable for the following:    Prothrombin Time 32.0 (*)    All other components within normal limits  BASIC METABOLIC PANEL - Abnormal; Notable for the following:    Potassium 2.9 (*)    Glucose, Bld 64 (*)    BUN 58 (*)    Creatinine, Ser 1.86 (*)    Calcium 8.2 (*)    GFR calc non Af Amer 30 (*)    GFR calc Af Amer 35 (*)    All other components within normal limits  GLUCOSE, CAPILLARY - Abnormal; Notable for the following:    Glucose-Capillary 173 (*)    All other components within normal limits  GLUCOSE, CAPILLARY - Abnormal; Notable for the following:    Glucose-Capillary 159 (*)    All other components within normal limits  HEMOGLOBIN AND HEMATOCRIT, BLOOD - Abnormal; Notable for the following:    Hemoglobin 7.8 (*)    HCT 23.1 (*)    All other  components within normal limits  GLUCOSE, CAPILLARY - Abnormal; Notable for the following:    Glucose-Capillary 48 (*)    All other components within normal limits  GLUCOSE, CAPILLARY - Abnormal; Notable for the following:    Glucose-Capillary 60 (*)    All other components within normal limits  GLUCOSE, CAPILLARY - Abnormal; Notable for the following:    Glucose-Capillary 115 (*)    All other components within normal limits  HEMOGLOBIN AND HEMATOCRIT, BLOOD - Abnormal; Notable for the following:    Hemoglobin 8.2 (*)    HCT 24.2 (*)    All other components within normal limits  GLUCOSE, CAPILLARY - Abnormal; Notable for the following:    Glucose-Capillary 283 (*)    All other components within normal limits  CBC - Abnormal; Notable for the following:    RBC 2.69 (*)    Hemoglobin 7.6 (*)    HCT 23.3 (*)    RDW 16.6 (*)    Platelets 89 (*)    All other components within normal limits  GLUCOSE, CAPILLARY - Abnormal; Notable for the following:    Glucose-Capillary 124 (*)    All other components within normal limits  GLUCOSE, CAPILLARY - Abnormal; Notable for the following:    Glucose-Capillary 175 (*)    All other components within normal limits  GLUCOSE, CAPILLARY - Abnormal; Notable for the following:    Glucose-Capillary 142 (*)    All other components within normal limits  GLUCOSE, CAPILLARY - Abnormal; Notable for the following:    Glucose-Capillary 194 (*)    All other components within normal limits  I-STAT CG4 LACTIC ACID, ED - Abnormal; Notable for the following:    Lactic Acid, Venous 3.11 (*)    All other components within normal limits  LIPASE, BLOOD  LACTIC ACID, PLASMA  TYPE AND SCREEN  PREPARE RBC (CROSSMATCH)     Procedures Procedures (including critical care time)  Medications Ordered in ED Medications  0.9 %  sodium chloride infusion (10 mL/hr Intravenous New Bag/Given 07/02/16 1549)  potassium chloride SA (K-DUR,KLOR-CON) CR tablet 40 mEq (40 mEq  Oral Given 07/03/16 1153)   Initial labs concerning for anemia, and given concern for the patient's ongoing rectal bleeding patient had a type and screen to prepare for transfusion.  Update:, Patient remains in similar condition, continues to have bright red blood per rectum. Patient is receiving blood transfusion. I discussed patient's case with our hospitalist colleagues for admission.   Initial Impression / Assessment and Plan / ED Course  I have reviewed the triage vital signs and the nursing notes.  Pertinent labs & imaging results that were available during my care of the patient were reviewed by me and considered in my medical decision making (see chart for details).  Elderly male anticoagulated presents with rectal bleeding, syncope, and concern for symptomatic anemia. Here the patient is found to have anemia, requiring blood transfusion, admission for further evaluation and management.   Final Clinical Impressions(s) / ED Diagnoses  GI bleed Symptomatic anemia   CRITICAL CARE Performed by: Carmin Muskrat Total critical care time: 35 minutes Critical care time was exclusive of separately billable procedures and treating other patients. Critical care was necessary to treat or prevent imminent or life-threatening deterioration. Critical care was time spent personally by me on the following activities: development of treatment plan with patient and/or surrogate as well as nursing, discussions with consultants, evaluation of patient's response to treatment, examination of patient, obtaining history from patient or surrogate, ordering and performing treatments and interventions, ordering and review of laboratory studies, ordering and review of radiographic studies, pulse oximetry and re-evaluation of patient's condition.    Carmin Muskrat, MD 07/13/16 [phone]

## 2016-07-16 DIAGNOSIS — Z952 Presence of prosthetic heart valve: Secondary | ICD-10-CM | POA: Diagnosis not present

## 2016-07-16 DIAGNOSIS — D6489 Other specified anemias: Secondary | ICD-10-CM | POA: Diagnosis not present

## 2016-07-16 DIAGNOSIS — I4891 Unspecified atrial fibrillation: Secondary | ICD-10-CM | POA: Diagnosis not present

## 2016-07-16 DIAGNOSIS — I509 Heart failure, unspecified: Secondary | ICD-10-CM | POA: Diagnosis not present

## 2016-07-16 DIAGNOSIS — Z6825 Body mass index (BMI) 25.0-25.9, adult: Secondary | ICD-10-CM | POA: Diagnosis not present

## 2016-07-16 DIAGNOSIS — I130 Hypertensive heart and chronic kidney disease with heart failure and stage 1 through stage 4 chronic kidney disease, or unspecified chronic kidney disease: Secondary | ICD-10-CM | POA: Diagnosis not present

## 2016-07-25 DIAGNOSIS — E1151 Type 2 diabetes mellitus with diabetic peripheral angiopathy without gangrene: Secondary | ICD-10-CM | POA: Diagnosis not present

## 2016-07-25 DIAGNOSIS — Z6825 Body mass index (BMI) 25.0-25.9, adult: Secondary | ICD-10-CM | POA: Diagnosis not present

## 2016-07-25 DIAGNOSIS — D649 Anemia, unspecified: Secondary | ICD-10-CM | POA: Diagnosis not present

## 2016-07-25 DIAGNOSIS — I509 Heart failure, unspecified: Secondary | ICD-10-CM | POA: Diagnosis not present

## 2016-07-25 DIAGNOSIS — I4891 Unspecified atrial fibrillation: Secondary | ICD-10-CM | POA: Diagnosis not present

## 2016-07-25 DIAGNOSIS — I130 Hypertensive heart and chronic kidney disease with heart failure and stage 1 through stage 4 chronic kidney disease, or unspecified chronic kidney disease: Secondary | ICD-10-CM | POA: Diagnosis not present

## 2016-07-25 DIAGNOSIS — K922 Gastrointestinal hemorrhage, unspecified: Secondary | ICD-10-CM | POA: Diagnosis not present

## 2016-07-31 DIAGNOSIS — L570 Actinic keratosis: Secondary | ICD-10-CM | POA: Diagnosis not present

## 2016-08-02 DIAGNOSIS — Z952 Presence of prosthetic heart valve: Secondary | ICD-10-CM | POA: Diagnosis not present

## 2016-08-02 DIAGNOSIS — I2510 Atherosclerotic heart disease of native coronary artery without angina pectoris: Secondary | ICD-10-CM | POA: Diagnosis not present

## 2016-08-02 DIAGNOSIS — I130 Hypertensive heart and chronic kidney disease with heart failure and stage 1 through stage 4 chronic kidney disease, or unspecified chronic kidney disease: Secondary | ICD-10-CM | POA: Diagnosis not present

## 2016-08-02 DIAGNOSIS — J449 Chronic obstructive pulmonary disease, unspecified: Secondary | ICD-10-CM | POA: Diagnosis not present

## 2016-08-02 DIAGNOSIS — D6489 Other specified anemias: Secondary | ICD-10-CM | POA: Diagnosis not present

## 2016-08-02 DIAGNOSIS — I4891 Unspecified atrial fibrillation: Secondary | ICD-10-CM | POA: Diagnosis not present

## 2016-08-02 DIAGNOSIS — I509 Heart failure, unspecified: Secondary | ICD-10-CM | POA: Diagnosis not present

## 2016-08-02 DIAGNOSIS — N183 Chronic kidney disease, stage 3 (moderate): Secondary | ICD-10-CM | POA: Diagnosis not present

## 2016-08-02 DIAGNOSIS — K922 Gastrointestinal hemorrhage, unspecified: Secondary | ICD-10-CM | POA: Diagnosis not present

## 2016-08-02 DIAGNOSIS — Z7901 Long term (current) use of anticoagulants: Secondary | ICD-10-CM | POA: Diagnosis not present

## 2016-08-02 DIAGNOSIS — R2689 Other abnormalities of gait and mobility: Secondary | ICD-10-CM | POA: Diagnosis not present

## 2016-08-02 DIAGNOSIS — F17210 Nicotine dependence, cigarettes, uncomplicated: Secondary | ICD-10-CM | POA: Diagnosis not present

## 2016-08-08 DIAGNOSIS — I130 Hypertensive heart and chronic kidney disease with heart failure and stage 1 through stage 4 chronic kidney disease, or unspecified chronic kidney disease: Secondary | ICD-10-CM | POA: Diagnosis not present

## 2016-08-08 DIAGNOSIS — Z6826 Body mass index (BMI) 26.0-26.9, adult: Secondary | ICD-10-CM | POA: Diagnosis not present

## 2016-08-08 DIAGNOSIS — F17210 Nicotine dependence, cigarettes, uncomplicated: Secondary | ICD-10-CM | POA: Diagnosis not present

## 2016-08-08 DIAGNOSIS — E1151 Type 2 diabetes mellitus with diabetic peripheral angiopathy without gangrene: Secondary | ICD-10-CM | POA: Diagnosis not present

## 2016-08-08 DIAGNOSIS — D649 Anemia, unspecified: Secondary | ICD-10-CM | POA: Diagnosis not present

## 2016-08-08 DIAGNOSIS — I4891 Unspecified atrial fibrillation: Secondary | ICD-10-CM | POA: Diagnosis not present

## 2016-08-08 DIAGNOSIS — Z7901 Long term (current) use of anticoagulants: Secondary | ICD-10-CM | POA: Diagnosis not present

## 2016-09-05 DIAGNOSIS — R627 Adult failure to thrive: Secondary | ICD-10-CM | POA: Diagnosis not present

## 2016-09-05 DIAGNOSIS — I130 Hypertensive heart and chronic kidney disease with heart failure and stage 1 through stage 4 chronic kidney disease, or unspecified chronic kidney disease: Secondary | ICD-10-CM | POA: Diagnosis not present

## 2016-09-05 DIAGNOSIS — D692 Other nonthrombocytopenic purpura: Secondary | ICD-10-CM | POA: Diagnosis not present

## 2016-09-05 DIAGNOSIS — Z6825 Body mass index (BMI) 25.0-25.9, adult: Secondary | ICD-10-CM | POA: Diagnosis not present

## 2016-09-05 DIAGNOSIS — K922 Gastrointestinal hemorrhage, unspecified: Secondary | ICD-10-CM | POA: Diagnosis not present

## 2016-09-05 DIAGNOSIS — I4891 Unspecified atrial fibrillation: Secondary | ICD-10-CM | POA: Diagnosis not present

## 2016-09-05 DIAGNOSIS — F3341 Major depressive disorder, recurrent, in partial remission: Secondary | ICD-10-CM | POA: Diagnosis not present

## 2016-09-05 DIAGNOSIS — D649 Anemia, unspecified: Secondary | ICD-10-CM | POA: Diagnosis not present

## 2016-09-05 DIAGNOSIS — R2689 Other abnormalities of gait and mobility: Secondary | ICD-10-CM | POA: Diagnosis not present

## 2016-09-05 DIAGNOSIS — Z794 Long term (current) use of insulin: Secondary | ICD-10-CM | POA: Diagnosis not present

## 2016-09-05 DIAGNOSIS — E1151 Type 2 diabetes mellitus with diabetic peripheral angiopathy without gangrene: Secondary | ICD-10-CM | POA: Diagnosis not present

## 2016-09-05 DIAGNOSIS — Z7901 Long term (current) use of anticoagulants: Secondary | ICD-10-CM | POA: Diagnosis not present

## 2016-10-03 DIAGNOSIS — K922 Gastrointestinal hemorrhage, unspecified: Secondary | ICD-10-CM | POA: Diagnosis not present

## 2016-10-03 DIAGNOSIS — Z7901 Long term (current) use of anticoagulants: Secondary | ICD-10-CM | POA: Diagnosis not present

## 2016-10-10 ENCOUNTER — Encounter (HOSPITAL_BASED_OUTPATIENT_CLINIC_OR_DEPARTMENT_OTHER): Payer: PPO | Attending: Internal Medicine

## 2016-10-10 DIAGNOSIS — I509 Heart failure, unspecified: Secondary | ICD-10-CM | POA: Diagnosis not present

## 2016-10-10 DIAGNOSIS — E11622 Type 2 diabetes mellitus with other skin ulcer: Secondary | ICD-10-CM | POA: Diagnosis not present

## 2016-10-10 DIAGNOSIS — Z952 Presence of prosthetic heart valve: Secondary | ICD-10-CM | POA: Insufficient documentation

## 2016-10-10 DIAGNOSIS — I70248 Atherosclerosis of native arteries of left leg with ulceration of other part of lower left leg: Secondary | ICD-10-CM | POA: Insufficient documentation

## 2016-10-10 DIAGNOSIS — E1151 Type 2 diabetes mellitus with diabetic peripheral angiopathy without gangrene: Secondary | ICD-10-CM | POA: Diagnosis not present

## 2016-10-10 DIAGNOSIS — Z794 Long term (current) use of insulin: Secondary | ICD-10-CM | POA: Insufficient documentation

## 2016-10-10 DIAGNOSIS — I110 Hypertensive heart disease with heart failure: Secondary | ICD-10-CM | POA: Diagnosis not present

## 2016-10-10 DIAGNOSIS — L97829 Non-pressure chronic ulcer of other part of left lower leg with unspecified severity: Secondary | ICD-10-CM | POA: Diagnosis not present

## 2016-10-10 DIAGNOSIS — F17210 Nicotine dependence, cigarettes, uncomplicated: Secondary | ICD-10-CM | POA: Diagnosis not present

## 2016-10-10 DIAGNOSIS — L97221 Non-pressure chronic ulcer of left calf limited to breakdown of skin: Secondary | ICD-10-CM | POA: Insufficient documentation

## 2016-10-10 DIAGNOSIS — J449 Chronic obstructive pulmonary disease, unspecified: Secondary | ICD-10-CM | POA: Diagnosis not present

## 2016-10-10 DIAGNOSIS — I70242 Atherosclerosis of native arteries of left leg with ulceration of calf: Secondary | ICD-10-CM | POA: Diagnosis not present

## 2016-10-10 DIAGNOSIS — L97222 Non-pressure chronic ulcer of left calf with fat layer exposed: Secondary | ICD-10-CM | POA: Diagnosis not present

## 2016-10-10 DIAGNOSIS — I87312 Chronic venous hypertension (idiopathic) with ulcer of left lower extremity: Secondary | ICD-10-CM | POA: Diagnosis not present

## 2016-10-10 DIAGNOSIS — E1140 Type 2 diabetes mellitus with diabetic neuropathy, unspecified: Secondary | ICD-10-CM | POA: Insufficient documentation

## 2016-10-10 DIAGNOSIS — R600 Localized edema: Secondary | ICD-10-CM | POA: Diagnosis not present

## 2016-10-17 ENCOUNTER — Other Ambulatory Visit: Payer: Self-pay | Admitting: Surgery

## 2016-10-17 ENCOUNTER — Ambulatory Visit (HOSPITAL_COMMUNITY)
Admission: RE | Admit: 2016-10-17 | Discharge: 2016-10-17 | Disposition: A | Discharge status: Home / Self Care | Payer: PPO | Source: Ambulatory Visit | Attending: Vascular Surgery | Admitting: Vascular Surgery

## 2016-10-17 ENCOUNTER — Ambulatory Visit (HOSPITAL_COMMUNITY): Admission: RE | Admit: 2016-10-17 | Payer: PPO | Source: Ambulatory Visit

## 2016-10-17 DIAGNOSIS — L97929 Non-pressure chronic ulcer of unspecified part of left lower leg with unspecified severity: Secondary | ICD-10-CM | POA: Insufficient documentation

## 2016-10-17 DIAGNOSIS — L97829 Non-pressure chronic ulcer of other part of left lower leg with unspecified severity: Secondary | ICD-10-CM | POA: Diagnosis not present

## 2016-10-17 DIAGNOSIS — I87312 Chronic venous hypertension (idiopathic) with ulcer of left lower extremity: Secondary | ICD-10-CM | POA: Diagnosis not present

## 2016-10-17 DIAGNOSIS — L97229 Non-pressure chronic ulcer of left calf with unspecified severity: Secondary | ICD-10-CM | POA: Diagnosis not present

## 2016-10-17 DIAGNOSIS — E11622 Type 2 diabetes mellitus with other skin ulcer: Secondary | ICD-10-CM | POA: Diagnosis not present

## 2016-11-19 DIAGNOSIS — I6523 Occlusion and stenosis of bilateral carotid arteries: Secondary | ICD-10-CM | POA: Diagnosis not present

## 2016-11-21 DIAGNOSIS — H353132 Nonexudative age-related macular degeneration, bilateral, intermediate dry stage: Secondary | ICD-10-CM | POA: Diagnosis not present

## 2016-11-21 DIAGNOSIS — Z961 Presence of intraocular lens: Secondary | ICD-10-CM | POA: Diagnosis not present

## 2016-11-22 DIAGNOSIS — Z7901 Long term (current) use of anticoagulants: Secondary | ICD-10-CM | POA: Diagnosis not present

## 2016-11-22 DIAGNOSIS — L03115 Cellulitis of right lower limb: Secondary | ICD-10-CM | POA: Diagnosis not present

## 2016-11-22 DIAGNOSIS — Z23 Encounter for immunization: Secondary | ICD-10-CM | POA: Diagnosis not present

## 2016-11-29 DIAGNOSIS — Z952 Presence of prosthetic heart valve: Secondary | ICD-10-CM | POA: Diagnosis not present

## 2016-11-29 DIAGNOSIS — I739 Peripheral vascular disease, unspecified: Secondary | ICD-10-CM | POA: Diagnosis not present

## 2016-11-29 DIAGNOSIS — I482 Chronic atrial fibrillation: Secondary | ICD-10-CM | POA: Diagnosis not present

## 2016-11-29 DIAGNOSIS — I6523 Occlusion and stenosis of bilateral carotid arteries: Secondary | ICD-10-CM | POA: Diagnosis not present

## 2016-12-04 DIAGNOSIS — I4891 Unspecified atrial fibrillation: Secondary | ICD-10-CM | POA: Diagnosis not present

## 2016-12-04 DIAGNOSIS — Z6826 Body mass index (BMI) 26.0-26.9, adult: Secondary | ICD-10-CM | POA: Diagnosis not present

## 2016-12-04 DIAGNOSIS — D509 Iron deficiency anemia, unspecified: Secondary | ICD-10-CM | POA: Diagnosis not present

## 2016-12-04 DIAGNOSIS — Z7901 Long term (current) use of anticoagulants: Secondary | ICD-10-CM | POA: Diagnosis not present

## 2016-12-10 DIAGNOSIS — I10 Essential (primary) hypertension: Secondary | ICD-10-CM | POA: Diagnosis not present

## 2016-12-10 DIAGNOSIS — Z7901 Long term (current) use of anticoagulants: Secondary | ICD-10-CM | POA: Diagnosis not present

## 2016-12-10 DIAGNOSIS — I4891 Unspecified atrial fibrillation: Secondary | ICD-10-CM | POA: Diagnosis not present

## 2016-12-10 DIAGNOSIS — Z952 Presence of prosthetic heart valve: Secondary | ICD-10-CM | POA: Diagnosis not present

## 2016-12-10 DIAGNOSIS — R5383 Other fatigue: Secondary | ICD-10-CM | POA: Diagnosis not present

## 2016-12-10 DIAGNOSIS — D649 Anemia, unspecified: Secondary | ICD-10-CM | POA: Diagnosis not present

## 2016-12-10 DIAGNOSIS — Z6826 Body mass index (BMI) 26.0-26.9, adult: Secondary | ICD-10-CM | POA: Diagnosis not present

## 2016-12-12 DIAGNOSIS — N401 Enlarged prostate with lower urinary tract symptoms: Secondary | ICD-10-CM | POA: Diagnosis not present

## 2016-12-12 DIAGNOSIS — Z8551 Personal history of malignant neoplasm of bladder: Secondary | ICD-10-CM | POA: Diagnosis not present

## 2016-12-12 DIAGNOSIS — R351 Nocturia: Secondary | ICD-10-CM | POA: Diagnosis not present

## 2016-12-17 DIAGNOSIS — I4891 Unspecified atrial fibrillation: Secondary | ICD-10-CM | POA: Diagnosis not present

## 2016-12-17 DIAGNOSIS — D649 Anemia, unspecified: Secondary | ICD-10-CM | POA: Diagnosis not present

## 2016-12-17 DIAGNOSIS — Z7901 Long term (current) use of anticoagulants: Secondary | ICD-10-CM | POA: Diagnosis not present

## 2016-12-23 ENCOUNTER — Inpatient Hospital Stay (HOSPITAL_COMMUNITY)
Admission: EM | Admit: 2016-12-23 | Discharge: 2016-12-25 | DRG: 563 | Disposition: A | Discharge status: Home Health | Payer: PPO | Attending: Family Medicine | Admitting: Family Medicine

## 2016-12-23 ENCOUNTER — Emergency Department (HOSPITAL_COMMUNITY): Payer: PPO

## 2016-12-23 ENCOUNTER — Encounter (HOSPITAL_COMMUNITY): Payer: Self-pay

## 2016-12-23 DIAGNOSIS — D638 Anemia in other chronic diseases classified elsewhere: Secondary | ICD-10-CM | POA: Diagnosis not present

## 2016-12-23 DIAGNOSIS — E1151 Type 2 diabetes mellitus with diabetic peripheral angiopathy without gangrene: Secondary | ICD-10-CM | POA: Diagnosis present

## 2016-12-23 DIAGNOSIS — W010XXA Fall on same level from slipping, tripping and stumbling without subsequent striking against object, initial encounter: Secondary | ICD-10-CM | POA: Diagnosis present

## 2016-12-23 DIAGNOSIS — E1165 Type 2 diabetes mellitus with hyperglycemia: Secondary | ICD-10-CM | POA: Diagnosis present

## 2016-12-23 DIAGNOSIS — N184 Chronic kidney disease, stage 4 (severe): Secondary | ICD-10-CM | POA: Diagnosis present

## 2016-12-23 DIAGNOSIS — F329 Major depressive disorder, single episode, unspecified: Secondary | ICD-10-CM | POA: Diagnosis present

## 2016-12-23 DIAGNOSIS — D62 Acute posthemorrhagic anemia: Secondary | ICD-10-CM | POA: Diagnosis present

## 2016-12-23 DIAGNOSIS — Z886 Allergy status to analgesic agent status: Secondary | ICD-10-CM

## 2016-12-23 DIAGNOSIS — Z961 Presence of intraocular lens: Secondary | ICD-10-CM | POA: Diagnosis not present

## 2016-12-23 DIAGNOSIS — I130 Hypertensive heart and chronic kidney disease with heart failure and stage 1 through stage 4 chronic kidney disease, or unspecified chronic kidney disease: Secondary | ICD-10-CM | POA: Diagnosis present

## 2016-12-23 DIAGNOSIS — Z7901 Long term (current) use of anticoagulants: Secondary | ICD-10-CM

## 2016-12-23 DIAGNOSIS — E1122 Type 2 diabetes mellitus with diabetic chronic kidney disease: Secondary | ICD-10-CM | POA: Diagnosis present

## 2016-12-23 DIAGNOSIS — S42231A 3-part fracture of surgical neck of right humerus, initial encounter for closed fracture: Secondary | ICD-10-CM | POA: Diagnosis not present

## 2016-12-23 DIAGNOSIS — N179 Acute kidney failure, unspecified: Secondary | ICD-10-CM | POA: Diagnosis present

## 2016-12-23 DIAGNOSIS — Z85828 Personal history of other malignant neoplasm of skin: Secondary | ICD-10-CM

## 2016-12-23 DIAGNOSIS — E118 Type 2 diabetes mellitus with unspecified complications: Secondary | ICD-10-CM | POA: Diagnosis present

## 2016-12-23 DIAGNOSIS — Z8551 Personal history of malignant neoplasm of bladder: Secondary | ICD-10-CM | POA: Diagnosis not present

## 2016-12-23 DIAGNOSIS — D696 Thrombocytopenia, unspecified: Secondary | ICD-10-CM | POA: Diagnosis not present

## 2016-12-23 DIAGNOSIS — I2510 Atherosclerotic heart disease of native coronary artery without angina pectoris: Secondary | ICD-10-CM | POA: Diagnosis present

## 2016-12-23 DIAGNOSIS — F17210 Nicotine dependence, cigarettes, uncomplicated: Secondary | ICD-10-CM | POA: Diagnosis present

## 2016-12-23 DIAGNOSIS — I739 Peripheral vascular disease, unspecified: Secondary | ICD-10-CM | POA: Diagnosis not present

## 2016-12-23 DIAGNOSIS — I350 Nonrheumatic aortic (valve) stenosis: Secondary | ICD-10-CM | POA: Diagnosis present

## 2016-12-23 DIAGNOSIS — T148XXA Other injury of unspecified body region, initial encounter: Secondary | ICD-10-CM | POA: Diagnosis not present

## 2016-12-23 DIAGNOSIS — I482 Chronic atrial fibrillation, unspecified: Secondary | ICD-10-CM | POA: Diagnosis present

## 2016-12-23 DIAGNOSIS — Z888 Allergy status to other drugs, medicaments and biological substances status: Secondary | ICD-10-CM

## 2016-12-23 DIAGNOSIS — I5033 Acute on chronic diastolic (congestive) heart failure: Secondary | ICD-10-CM | POA: Diagnosis present

## 2016-12-23 DIAGNOSIS — Z794 Long term (current) use of insulin: Secondary | ICD-10-CM

## 2016-12-23 DIAGNOSIS — J449 Chronic obstructive pulmonary disease, unspecified: Secondary | ICD-10-CM | POA: Diagnosis present

## 2016-12-23 DIAGNOSIS — Z7983 Long term (current) use of bisphosphonates: Secondary | ICD-10-CM

## 2016-12-23 DIAGNOSIS — Z9841 Cataract extraction status, right eye: Secondary | ICD-10-CM

## 2016-12-23 DIAGNOSIS — S42202A Unspecified fracture of upper end of left humerus, initial encounter for closed fracture: Secondary | ICD-10-CM | POA: Diagnosis not present

## 2016-12-23 DIAGNOSIS — S42352A Displaced comminuted fracture of shaft of humerus, left arm, initial encounter for closed fracture: Principal | ICD-10-CM | POA: Diagnosis present

## 2016-12-23 DIAGNOSIS — E1140 Type 2 diabetes mellitus with diabetic neuropathy, unspecified: Secondary | ICD-10-CM | POA: Diagnosis not present

## 2016-12-23 DIAGNOSIS — I5032 Chronic diastolic (congestive) heart failure: Secondary | ICD-10-CM | POA: Diagnosis not present

## 2016-12-23 DIAGNOSIS — Z9842 Cataract extraction status, left eye: Secondary | ICD-10-CM

## 2016-12-23 DIAGNOSIS — IMO0002 Reserved for concepts with insufficient information to code with codable children: Secondary | ICD-10-CM | POA: Diagnosis present

## 2016-12-23 DIAGNOSIS — M25512 Pain in left shoulder: Secondary | ICD-10-CM | POA: Diagnosis not present

## 2016-12-23 DIAGNOSIS — Z79899 Other long term (current) drug therapy: Secondary | ICD-10-CM

## 2016-12-23 DIAGNOSIS — S42292A Other displaced fracture of upper end of left humerus, initial encounter for closed fracture: Secondary | ICD-10-CM

## 2016-12-23 DIAGNOSIS — S42302A Unspecified fracture of shaft of humerus, left arm, initial encounter for closed fracture: Secondary | ICD-10-CM | POA: Diagnosis present

## 2016-12-23 LAB — CBC
HEMATOCRIT: 25.3 % — AB (ref 39.0–52.0)
HEMOGLOBIN: 8 g/dL — AB (ref 13.0–17.0)
MCH: 29.4 pg (ref 26.0–34.0)
MCHC: 31.6 g/dL (ref 30.0–36.0)
MCV: 93 fL (ref 78.0–100.0)
Platelets: 104 10*3/uL — ABNORMAL LOW (ref 150–400)
RBC: 2.72 MIL/uL — ABNORMAL LOW (ref 4.22–5.81)
RDW: 19.7 % — AB (ref 11.5–15.5)
WBC: 15.2 10*3/uL — ABNORMAL HIGH (ref 4.0–10.5)

## 2016-12-23 LAB — BASIC METABOLIC PANEL
ANION GAP: 8 (ref 5–15)
BUN: 20 mg/dL (ref 6–20)
CALCIUM: 8.6 mg/dL — AB (ref 8.9–10.3)
CO2: 26 mmol/L (ref 22–32)
CREATININE: 1.52 mg/dL — AB (ref 0.61–1.24)
Chloride: 104 mmol/L (ref 101–111)
GFR, EST AFRICAN AMERICAN: 44 mL/min — AB (ref >60)
GFR, EST NON AFRICAN AMERICAN: 38 mL/min — AB (ref >60)
Glucose, Bld: 257 mg/dL — ABNORMAL HIGH (ref 65–99)
Potassium: 3.4 mmol/L — ABNORMAL LOW (ref 3.5–5.1)
SODIUM: 138 mmol/L (ref 135–145)

## 2016-12-23 LAB — HEMOGLOBIN A1C
HEMOGLOBIN A1C: 6.2 % — AB (ref 4.8–5.6)
Mean Plasma Glucose: 131.24 mg/dL

## 2016-12-23 LAB — PROTIME-INR
INR: 2.14
Prothrombin Time: 23.7 seconds — ABNORMAL HIGH (ref 11.4–15.2)

## 2016-12-23 LAB — CBG MONITORING, ED
GLUCOSE-CAPILLARY: 250 mg/dL — AB (ref 65–99)
Glucose-Capillary: 248 mg/dL — ABNORMAL HIGH (ref 65–99)
Glucose-Capillary: 224 mg/dL — ABNORMAL HIGH (ref 65–99)

## 2016-12-23 MED ORDER — SODIUM CHLORIDE 0.9 % IV SOLN
INTRAVENOUS | Status: DC
  Administered 2016-12-23 – 2016-12-25 (×3): via INTRAVENOUS

## 2016-12-23 MED ORDER — FLEET ENEMA 7-19 GM/118ML RE ENEM: 1.0000 | ENEMA | Freq: Once | RECTAL | Status: DC | PRN

## 2016-12-23 MED ORDER — HYDROMORPHONE HCL 1 MG/ML IJ SOLN
1.0000 mg | Freq: Once | INTRAMUSCULAR | Status: AC
  Administered 2016-12-23: 1 mg via INTRAVENOUS
  Filled 2016-12-23: qty 1

## 2016-12-23 MED ORDER — SIMVASTATIN 20 MG PO TABS
20.0000 mg | ORAL_TABLET | Freq: Every evening | ORAL | Status: DC
  Administered 2016-12-23 – 2016-12-24 (×2): 20 mg via ORAL
  Filled 2016-12-23 (×2): qty 1

## 2016-12-23 MED ORDER — MORPHINE SULFATE (PF) 4 MG/ML IV SOLN: 0.5000 mg | INTRAVENOUS | Status: DC | PRN

## 2016-12-23 MED ORDER — LORATADINE 10 MG PO TABS
10.0000 mg | ORAL_TABLET | Freq: Every day | ORAL | Status: DC
Start: 2016-12-24 — End: 2016-12-25
  Administered 2016-12-24 – 2016-12-25 (×2): 10 mg via ORAL
  Filled 2016-12-23 (×2): qty 1

## 2016-12-23 MED ORDER — TRAMADOL HCL 50 MG PO TABS
50.0000 mg | ORAL_TABLET | Freq: Every evening | ORAL | Status: DC
  Administered 2016-12-23 – 2016-12-24 (×2): 50 mg via ORAL
  Filled 2016-12-23 (×2): qty 1

## 2016-12-23 MED ORDER — BISACODYL 5 MG PO TBEC: 5.0000 mg | DELAYED_RELEASE_TABLET | Freq: Every day | ORAL | Status: DC | PRN

## 2016-12-23 MED ORDER — FINASTERIDE 5 MG PO TABS
5.0000 mg | ORAL_TABLET | Freq: Every day | ORAL | Status: DC
  Administered 2016-12-24 – 2016-12-25 (×2): 5 mg via ORAL
  Filled 2016-12-23 (×2): qty 1

## 2016-12-23 MED ORDER — SENNOSIDES-DOCUSATE SODIUM 8.6-50 MG PO TABS: 1.0000 | ORAL_TABLET | Freq: Every evening | ORAL | Status: DC | PRN

## 2016-12-23 MED ORDER — UMECLIDINIUM-VILANTEROL 62.5-25 MCG/INH IN AEPB
1.0000 | INHALATION_SPRAY | Freq: Every day | RESPIRATORY_TRACT | Status: DC
  Administered 2016-12-24 – 2016-12-25 (×2): 1 via RESPIRATORY_TRACT
  Filled 2016-12-23: qty 14

## 2016-12-23 MED ORDER — INSULIN GLARGINE 100 UNIT/ML ~~LOC~~ SOLN
24.0000 [IU] | Freq: Every day | SUBCUTANEOUS | Status: DC
  Administered 2016-12-24 – 2016-12-25 (×2): 24 [IU] via SUBCUTANEOUS
  Filled 2016-12-23 (×2): qty 0.24

## 2016-12-23 MED ORDER — FERROUS SULFATE 325 (65 FE) MG PO TABS
325.0000 mg | ORAL_TABLET | Freq: Two times a day (BID) | ORAL | Status: DC
  Administered 2016-12-23 – 2016-12-25 (×4): 325 mg via ORAL
  Filled 2016-12-23 (×4): qty 1

## 2016-12-23 MED ORDER — HYDROCODONE-ACETAMINOPHEN 5-325 MG PO TABS
1.0000 | ORAL_TABLET | ORAL | Status: DC | PRN
  Administered 2016-12-25: 1 via ORAL
  Filled 2016-12-23: qty 1

## 2016-12-23 MED ORDER — POTASSIUM CHLORIDE CRYS ER 20 MEQ PO TBCR
20.0000 meq | EXTENDED_RELEASE_TABLET | Freq: Every day | ORAL | Status: DC
  Administered 2016-12-24 – 2016-12-25 (×2): 20 meq via ORAL
  Filled 2016-12-23 (×2): qty 1

## 2016-12-23 MED ORDER — BUPROPION HCL ER (XL) 150 MG PO TB24
150.0000 mg | ORAL_TABLET | Freq: Every day | ORAL | Status: DC
  Administered 2016-12-24 – 2016-12-25 (×2): 150 mg via ORAL
  Filled 2016-12-23 (×2): qty 1

## 2016-12-23 MED ORDER — FUROSEMIDE 20 MG PO TABS
40.0000 mg | ORAL_TABLET | Freq: Every day | ORAL | Status: DC
  Administered 2016-12-24 – 2016-12-25 (×2): 40 mg via ORAL
  Filled 2016-12-23 (×2): qty 2

## 2016-12-23 MED ORDER — METOPROLOL SUCCINATE ER 50 MG PO TB24
50.0000 mg | ORAL_TABLET | Freq: Two times a day (BID) | ORAL | Status: DC
  Administered 2016-12-23 – 2016-12-25 (×4): 50 mg via ORAL
  Filled 2016-12-23 (×4): qty 1

## 2016-12-23 MED ORDER — HYDROMORPHONE HCL 1 MG/ML IJ SOLN
0.5000 mg | Freq: Once | INTRAMUSCULAR | Status: AC
  Administered 2016-12-23: 0.5 mg via INTRAVENOUS
  Filled 2016-12-23: qty 1

## 2016-12-23 MED ORDER — DOXAZOSIN MESYLATE 4 MG PO TABS
4.0000 mg | ORAL_TABLET | Freq: Every evening | ORAL | Status: DC
  Administered 2016-12-23 – 2016-12-24 (×2): 4 mg via ORAL
  Filled 2016-12-23 (×4): qty 1

## 2016-12-23 MED ORDER — INSULIN ASPART 100 UNIT/ML ~~LOC~~ SOLN
0.0000 [IU] | Freq: Three times a day (TID) | SUBCUTANEOUS | Status: DC
  Administered 2016-12-23: 3 [IU] via SUBCUTANEOUS
  Administered 2016-12-24: 2 [IU] via SUBCUTANEOUS
  Administered 2016-12-24: 3 [IU] via SUBCUTANEOUS
  Administered 2016-12-25: 2 [IU] via SUBCUTANEOUS
  Administered 2016-12-25: 5 [IU] via SUBCUTANEOUS
  Filled 2016-12-23: qty 1

## 2016-12-23 MED ORDER — HYDROMORPHONE HCL 1 MG/ML IJ SOLN
0.5000 mg | INTRAMUSCULAR | Status: DC | PRN
  Administered 2016-12-24: 0.5 mg via INTRAVENOUS
  Filled 2016-12-23: qty 1

## 2016-12-23 NOTE — ED Notes (Signed)
Cup of ice chips given to Pt at 14:21. Another family member was noted at bedside.

## 2016-12-23 NOTE — ED Notes (Signed)
Pt found to be soiled of urine; pt still dressed in street clothes; pt cleaned of urine and sheets changed; gown applied; urinal at bedside; will continue to monitor

## 2016-12-23 NOTE — ED Provider Notes (Signed)
[facility] EMERGENCY DEPARTMENT Provider Note   CSN: [id number] Arrival date & time: 12/23/16  1150     History   Chief Complaint Chief Complaint  Patient presents with  . Fall    HPI Ronald [name] is a 83 y.o. male.  HPI Ronald [name] is a 83 y.o. male with hx of AS, bladder cancer, chronic afib, COPD, CHF, presents to ED with left shoulder pain. Pt states he was putting jacket on when got off balance and fell onto left shoulder. Pain and swelling to left shoulder.  Patient denies feeling dizzy or lightheaded prior to the fall.  He states that the fall is purely mechanical.  He did not hit his head or lost consciousness.  Denies any headache.  Denies any pain anywhere else.  Denies numbness or weakness to his arm or hand distal to the injury.  He received total of 200 mcg of fentanyl by EMS, still complaining of pain.  Significant swelling reported to the left shoulder.  Patient is on Coumadin.  Past Medical History:  Diagnosis Date  . AS (aortic stenosis)    bovine aortic valve replacement 01/07/11  . Bladder cancer [facility])    Bladder Cancer local  . Blood transfusion    w/hip operation  . Cellulitis of left lower extremity   . Chronic atrial fibrillation (HCC)   . Chronic diastolic congestive heart failure (HCC)   . Claudication in peripheral vascular disease (HCC) 06/17/2011  . COPD (chronic obstructive pulmonary disease) (HCC)   . Depression wife died 4 years ago.    Marland Kitchen History of stomach ulcers ~ 1951  . HTN (hypertension)   . Neuropathy due to secondary diabetes (HCC)   . Normal coronary arteries Sept 2012  . Peripheral vascular disease (HCC) very poor circulation legs and feet ... stents right and left legs... done in [name] 's office.   . Pneumonia 07/25/11   left  . Recurrent upper respiratory infection (URI)    sinusitis  . Renal artery stenosis (HCC) 2006   renal artery stent  . S/P angioplasty with stent, diamond back rotational  athrectomy Prox. Rt. SFA 06/17/2011 06/17/2011  . Shortness of breath   . Thrombocytopenia due to drugs    seen by [name] plts 114000 no rx  . Type II diabetes mellitus [facility])     Patient Active Problem List   Diagnosis Date Noted  . GI bleed 07/02/2016  . AKI (acute kidney injury) (HCC) 07/02/2016  . Lactic acidemia 07/02/2016  . Lower GI bleed   . Tobacco use 03/07/2015  . Diabetes mellitus type 2, uncontrolled (HCC) 12/20/2013  . Acute blood loss anemia 12/20/2013  . Allergic rhinitis 12/20/2013  . Intertrochanteric fracture of left femur (HCC) 12/12/2013  . Symptomatic anemia 12/12/2013  . Right acetabular fracture (HCC) 04/11/2013  . Acetabular fracture (HCC) 04/11/2013  . Chronic renal insufficiency, stage IV (severe) (HCC) 12/24/2012  . Bilateral claudication of lower limb (HCC) 12/24/2012  . Cellulitis 12/02/2012  . History of tobacco abuse- 75 years, quit in Feb 2014 07/06/2012  . Chronic diastolic heart failure (HCC) 07/06/2012  . COPD (chronic obstructive pulmonary disease) (HCC) 05/27/2012  . Pulmonary nodules 05/27/2012  . Normal coronary arteries, cath 11/12 07/25/2011  . Normal left ventricular systolic function, Echo 5/14 07/25/2011  . Closed right hip fracture, ORIF 06/27/11 complicated by gluteal hematoma while being Coumadinized post op 06/27/2011  . Aspirin allergy, rash, SOB. Pt is on Plavix 06/19/2011  .  Chronic atrial fibrillation ([place]) 06/19/2011  . Thrombocytopenia ([place]) 01/11/2011  . Leukocytosis 01/11/2011  . S/P aortic valve replacement: #23 Magna Ease Edwards Pericardial Valve  November 2012 01/08/2011  . AS (aortic stenosis)- s/p tissue AVR 01/07/11   . CAROTID BRUIT- moderate ICA disease 5/14 05/31/2008  . Diabetes mellitus with complication ([place]) 49/67/5916  . Hyperlipidemia 07/11/2007  . DEPRESSION 07/11/2007  . RESTLESS LEGS SYNDROME 07/11/2007  . Essential hypertension 07/11/2007  . PVD, Rt SFA PTA/HSRA 06/17/11 07/11/2007  .  DIVERTICULOSIS, COLON 07/11/2007  . RENAL CYST 07/11/2007  . ARTHRITIS 07/11/2007  . SKIN CANCER, HX OF 07/11/2007  . RHEUMATIC HEART DISEASE, HX OF 07/11/2007    Past Surgical History:  Procedure Laterality Date  . ABDOMINAL ANGIOGRAM  06/17/2011   Procedure: ABDOMINAL ANGIOGRAM;  Surgeon: Lorretta Harp, MD;  Location: [facility] CATH LAB;  Service: Cardiovascular;;  . AORTIC VALVE REPLACEMENT  01/07/2011   Procedure: AORTIC VALVE REPLACEMENT (AVR);  Surgeon: Grace Isaac, MD;  Location: [place];  Service: Open Heart Surgery;  Laterality: N/A;; magna-ease bovine 5m bioprosthesis  . ATHERECTOMY N/A 06/17/2011   Procedure: ATHERECTOMY;  Surgeon: JLorretta Harp MD;  [facility];  Service: Cardiovascular;  Laterality: N/A;  . CARDIAC CATHETERIZATION  11/19/10   normal coronaries, mod AS, 75% l RAS  . CARDIOVERSION  04/03/2011   Procedure: CARDIOVERSION;  Surgeon: DLeonie Man MD;  [facility]  Service: Cardiovascular;  Laterality: N/A;  . CATARACT EXTRACTION W/ INTRAOCULAR LENS  IMPLANT, BILATERAL  ~ 2007  . CHOLECYSTECTOMY    . FEMUR IM NAIL  06/27/2011   Procedure: INTRAMEDULLARY (IM) NAIL FEMORAL;  Surgeon: KMarin Shutter MD;  Location: [facility] ORS;  Service: Orthopedics;  Laterality: Right;  . FEMUR IM NAIL Left 12/14/2013   Procedure: INTRAMEDULLARY (IM) NAIL FEMORAL;  Surgeon: MMauri Pole MD;  Location: [facility] ORS;  Service: Orthopedics;  Laterality: Left;  . LOWER EXTREMITY ANGIOGRAM  06/17/2011   diamondback orbital rotational and cutting balloon atherectomy of the prox R SFA  . LOWER EXTREMITY ANGIOGRAM Bilateral 06/17/2011   Procedure: LOWER EXTREMITY ANGIOGRAM;  Surgeon: JLorretta Harp MD;  [facility];  Service: Cardiovascular;  Laterality: Bilateral;  . PERIPHERAL ARTERIAL STENT GRAFT     2006 left anf right illiac stents Dr BDeon Pilling . RENAL ARTERY STENT  2006   "I believe"  . TONSILLECTOMY AND ADENOIDECTOMY     "when I was a kid"       Home  Medications    Prior to Admission medications   Medication Sig Start Date End Date Taking? Authorizing Provider  alendronate (FOSAMAX) 70 MG tablet Take 70 mg by mouth every 14 (fourteen) days. Take 1 tablet every other Sunday with a full glass of water on an empty stomach.    [provider]  buPROPion (WELLBUTRIN XL) 150 MG 24 hr tablet Take 150 mg by mouth daily.    [provider]  doxazosin (CARDURA) 4 MG tablet Take 4 mg by mouth every evening.     Collins, Gina L, PA-C  ferrous sulfate 325 (65 FE) MG EC tablet Take 1 tablet (325 mg total) by mouth 2 (two) times daily. 07/04/16 07/04/17  PLavina Hamman MD  finasteride (PROSCAR) 5 MG tablet Take 5 mg by mouth daily.     [provider]  fluticasone (FLONASE) 50 MCG/ACT nasal spray Place 1 spray into both nostrils 2 (two) times daily.     [provider]  furosemide (LASIX) 40 MG tablet Take 1 tablet (40 mg total) by mouth daily. 07/04/16   Lavina Hamman, MD  glyBURIDE (DIABETA) 5 MG tablet Take 2.5-10 mg by mouth 2 (two) times daily with a meal. 2 tablets in the morning and 1/2 tablet in the evening    [provider]  Insulin Glargine (LANTUS) 100 UNIT/ML Solostar Pen Inject 24 Units into the skin daily.     [provider]  loratadine (CLARITIN) 10 MG tablet Take 10 mg by mouth daily.    [provider]  metoprolol succinate (TOPROL-XL) 50 MG 24 hr tablet Take 50 mg by mouth 2 (two) times daily. Take with or immediately following a meal.    [provider]  OVER THE COUNTER MEDICATION Take 1 capsule by mouth 2 (two) times daily. "Higher Thoughts"    [provider]  potassium chloride SA (K-DUR,KLOR-CON) 20 MEQ tablet Take 20 mEq by mouth daily.    [provider]  Salicylic Acid-Cleanser 6 % (Cream) KIT Apply topically. Used as directed    [provider]  simvastatin (ZOCOR) 20 MG tablet Take 20 mg by mouth every evening.    [provider]  traMADol (ULTRAM) 50 MG tablet Take 50 mg by mouth every evening.     [provider]  Umeclidinium-Vilanterol (ANORO ELLIPTA) 62.5-25 MCG/INH AEPB Inhale 1 puff into the lungs daily.    [provider]    Family History Family History  Problem Relation Age of Onset  . Heart disease Mother   . Cancer Brother        colon  . Stroke Father     Social History Social History  Substance Use Topics  . Smoking status: Current Every Day Smoker    Packs/day: 1.50    Years: 75.00    Types: Cigarettes  . Smokeless tobacco: Never Used  . Alcohol use No     Allergies   Aspirin   Review of Systems Review of Systems  Constitutional: Negative for chills and fever.  Respiratory: Negative for cough, chest tightness and shortness of breath.   Cardiovascular: Negative for chest pain, palpitations and leg swelling.  Gastrointestinal: Negative for abdominal distention, abdominal pain, diarrhea, nausea and vomiting.  Genitourinary: Negative for dysuria, frequency, hematuria and urgency.  Musculoskeletal: Positive for arthralgias and joint swelling. Negative for neck pain and neck stiffness.  Skin: Negative for rash.  Allergic/Immunologic: Negative for immunocompromised state.  Neurological: Negative for dizziness, weakness, light-headedness, numbness and headaches.  All other systems reviewed and are negative.    Physical Exam Updated Vital Signs BP (!) 183/78   Pulse 62   Resp 14   Ht _0  (1.803 m)   Wt 81.6 kg (180 lb)   SpO2 94%   BMI 25.10 kg/m   Physical Exam  Constitutional: He is oriented to person, place, and time. He appears well-developed and well-nourished. No distress.  HENT:  Head: Normocephalic and atraumatic.  Eyes: Conjunctivae are normal.  Neck: Neck supple.  Cardiovascular: Normal rate, regular rhythm and normal heart sounds.   Pulmonary/Chest: Effort normal. No respiratory distress. He has no wheezes. He has no rales.    Musculoskeletal: He exhibits no edema.  Impressively swollen left shoulder with bruising to the anterior joint.  Clavicle is nontender.  No tenderness to the left ribs.  Normal distal humerus, elbow, forearm, wrist and hand.  Full range of motion of all fingers of the hand.  Full range of motion of the  wrist.  Pain with any range of motion of the shoulder joint.  No tenderness palpation into the elbow or wrist joints.  Distal radial pulses intact.  Sensation is intact over all dermatomes of the arm and hand.  Specifically axillary nerve intact  Neurological: He is alert and oriented to person, place, and time.  Skin: Skin is warm and dry.  Nursing note and vitals reviewed.    ED Treatments / Results  Labs (all labs ordered are listed, but only abnormal results are displayed) Labs Reviewed - No data to display  EKG  EKG Interpretation None       Radiology No results found.  Procedures Procedures (including critical care time)  Medications Ordered in ED Medications  HYDROmorphone (DILAUDID) injection 0.5 mg (0.5 mg Intravenous Given 12/23/16 1210)     Initial Impression / Assessment and Plan / ED Course  I have reviewed the triage vital signs and the nursing notes.  Pertinent labs & imaging results that were available during my care of the patient were reviewed by me and considered in my medical decision making (see chart for details).    Patient in emergency department after mechanical fall, complaining of left shoulder pain and swelling.  Patient is on Coumadin, there is very impressive swelling and hematoma to the left shoulder.  Will get x-rays.  Pain medications ordered.  Will check basic labs and INR.  INR is 2.14.  Hemoglobin is 8, however at baseline.  X-ray as described above.  Patient still uncomfortable after 200 mcg of fentanyl, 1 dose of 0.5 mg Dilaudid, and 1 mg of Dilaudid.  he is having difficulty moving and having some concerns about going home.  Patient lives  alone.  Family is available to help, however do not live with the patient.  I will admit patient for pain control, placement.  Spoke with hospitalist, who will admit patient.  Discussed with orthopedics, they will consult.  Vitals:   12/23/16 1400 12/23/16 1415 12/23/16 1445 12/23/16 1500  BP: (!) 152/78 (!) 171/50  (!) 149/66  Pulse: 71 77 69 74  Resp: _0 SpO2: 99% 97% 97% 97%  Weight:      Height:         Final Clinical Impressions(s) / ED Diagnoses   Final diagnoses:  Other closed displaced fracture of proximal end of left humerus, initial encounter    New Prescriptions New Prescriptions   No medications on file     Jeannett Senior, Hershal Coria 12/23/16 [place], Eunice, MD 12/25/16 [phone]

## 2016-12-23 NOTE — ED Notes (Signed)
Blood was attempted to be drawn from Pt at 17:48 but only enough for his BMP and Protime-INR (CBC is still missing).

## 2016-12-23 NOTE — Progress Notes (Signed)
Orthopedic Tech Progress Note Patient Details:  Ronald [name] 02/15/1923 [id number]  Patient ID: Elby Showers, male   DOB: 12-03-1922, 83 y.o.   MRN: [id number] Pt cant have ohf due to age restrictions  Karolee Stamps 12/23/2016, 7:00 PM

## 2016-12-23 NOTE — Care Management Note (Signed)
Case Management Note  Patient Details  Name: BERNON ARVISO MRN: [id number] Date of Birth: April 16, 1922  Subjective/Objective:                   83 y.o. male with hx of AS, bladder cancer, chronic afib, COPD, CHF, presents to ED with left shoulder pain. Pt states he was putting jacket on when got off balance and fell onto left shoulder. Pain and swelling to left shoulder.  From home alone.  Action/Plan: Admit status OBSERVATION; anticipate discharge [place].   Expected Discharge Date:   (unknown)               Expected Discharge Plan:  [place]  In-House Referral:  NA  Discharge planning Services  CM Consult  Post Acute Care Choice:  Home Health Choice offered to:  Patient, Adult Children  DME Arranged:    DME Agency:     HH Arranged:    [place] Agency:     Status of Service:  In process, will continue to follow  If discussed at Long Length of Stay Meetings, dates discussed:    Additional Comments:  Fuller Mandril, RN 12/23/2016, 2:50 PM

## 2016-12-23 NOTE — H&P (Signed)
History and Physical    Ronald [name] UVO:[id number] DOB: Dec 04, 1922 DOA: 12/23/2016   PCP: [name], MD   Patient coming from:  Home    Chief Complaint: Left shoulder pain.  HPI: Ronald [name] is a 83 y.o. male with medical history significant for AS s/p bovine AVR, CDHF, CAF, COPD< Depression, HTN, thrombocytopenia, DM, brought to the ED after sustaining a mechanical fall while putting his coat on, falling on his left shoulder. Patient sustained significant pain and swelling to the left shoulder. He denies any dizziness or lightheadedness, vertigo  prior to the fall. He did not hit his head or loss consciousness. No compression was reported. The patient denies any headaches. He denies any pain anywhere else. He denies any numbness or unilateral weakness. He denies any shortness of breath, or chest pain. He denies any significant lower extremity swelling. He reports having had several fractures in the past, last one about one year ago to the elbow. He denies any dysuria, or gross hematuria. He denies any fever, chills or night sweats.   ED Course:  BP (!) 149/66   Pulse 74   Resp 11   Ht '5\' 11"'$  (1.803 m)   Wt 81.6 kg (180 lb)   SpO2 97%   BMI 25.10 kg/m    He received a total of 200 g of fentanyl by EMS, and still complaining of pain. He also received Dilaudid 0.5 mg IV 1. He was immobilized, and orthopedic consultation was obtained. No surgical procedure is to take place. However, his pain continues to the intractable, status requiring admission.   Review of Systems:  As per HPI otherwise all other systems reviewed and are negative  Past Medical History:  Diagnosis Date  . AS (aortic stenosis)    bovine aortic valve replacement 01/07/11  . Bladder cancer [facility])    Bladder Cancer local  . Blood transfusion    w/hip operation  . Cellulitis of left lower extremity   . Chronic atrial fibrillation ([place])   . Chronic diastolic congestive heart failure ([place])   . Claudication  in peripheral vascular disease ([place]) 06/17/2011  . COPD (chronic obstructive pulmonary disease) ([place])   . Depression wife died 4 years ago.    Marland Kitchen History of stomach ulcers ~ 1951  . HTN (hypertension)   . Neuropathy due to secondary diabetes ([place])   . Normal coronary arteries Sept 2012  . Peripheral vascular disease ([place]) very poor circulation legs and feet ... stents right and left legs... done in dr j. Gwenlyn Found 's office.   . Pneumonia 07/25/11   left  . Recurrent upper respiratory infection (URI)    sinusitis  . Renal artery stenosis ([place]) 2006   renal artery stent  . S/P angioplasty with stent, diamond back rotational athrectomy Prox. Rt. SFA 06/17/2011 06/17/2011  . Shortness of breath   . Thrombocytopenia due to drugs    seen by Dr Inda Merlin plts [id number] no rx  . Type II diabetes mellitus ([place])     Past Surgical History:  Procedure Laterality Date  . ABDOMINAL ANGIOGRAM  06/17/2011   Procedure: ABDOMINAL ANGIOGRAM;  Surgeon: Lorretta Harp, MD;  Location: [facility] CATH LAB;  Service: Cardiovascular;;  . AORTIC VALVE REPLACEMENT  01/07/2011   Procedure: AORTIC VALVE REPLACEMENT (AVR);  Surgeon: Grace Isaac, MD;  Location: [place];  Service: Open Heart Surgery;  Laterality: N/A;; magna-ease bovine 61m bioprosthesis  . ATHERECTOMY N/A 06/17/2011   Procedure: ATHERECTOMY;  Surgeon: JLorretta Harp MD;  Location: [place] CATH LAB;  Service: Cardiovascular;  Laterality: N/A;  . CARDIAC CATHETERIZATION  11/19/10   normal coronaries, mod AS, 75% l RAS  . CARDIOVERSION  04/03/2011   Procedure: CARDIOVERSION;  Surgeon: Leonie Man, MD;  Location: [place];  Service: Cardiovascular;  Laterality: N/A;  . CATARACT EXTRACTION W/ INTRAOCULAR LENS  IMPLANT, BILATERAL  ~ 2007  . CHOLECYSTECTOMY    . FEMUR IM NAIL  06/27/2011   Procedure: INTRAMEDULLARY (IM) NAIL FEMORAL;  Surgeon: Marin Shutter, MD;  [facility]: [facility];  Service: Orthopedics;  Laterality: Right;  . FEMUR IM NAIL Left 12/14/2013   Procedure:  INTRAMEDULLARY (IM) NAIL FEMORAL;  Surgeon: Mauri Pole, MD;  Location: [facility];  Service: Orthopedics;  Laterality: Left;  . LOWER EXTREMITY ANGIOGRAM  06/17/2011   diamondback orbital rotational and cutting balloon atherectomy of the prox R SFA  . LOWER EXTREMITY ANGIOGRAM Bilateral 06/17/2011   Procedure: LOWER EXTREMITY ANGIOGRAM;  Surgeon: Lorretta Harp, MD;  Location: [facility] CATH LAB;  Service: Cardiovascular;  Laterality: Bilateral;  . PERIPHERAL ARTERIAL STENT GRAFT     2006 left anf right illiac stents Dr Deon Pilling  . RENAL ARTERY STENT  2006   "I believe"  . TONSILLECTOMY AND ADENOIDECTOMY     "when I was a kid"    Social History Social History   Social History  . Marital status: Widowed    Spouse name: N/A  . Number of children: 3  . Years of education: N/A   Occupational History  . sales    Social History Main Topics  . Smoking status: Current Every Day Smoker    Packs/day: 1.50    Years: 75.00    Types: Cigarettes  . Smokeless tobacco: Never Used  . Alcohol use No  . Drug use: No  . Sexual activity: No   Other Topics Concern  . Not on file   Social History Narrative  . No narrative on file     Allergies  Allergen Reactions  . Aspirin Anaphylaxis, Shortness Of Breath and Rash    "broke out in white welts; red blotches neck and face; windpipe closing; ~ 1962"    Family History  Problem Relation Age of Onset  . Heart disease Mother   . Cancer Brother        colon  . Stroke Father       Prior to Admission medications   Medication Sig Start Date End Date Taking? Authorizing Provider  alendronate (FOSAMAX) 70 MG tablet Take 70 mg by mouth every 14 (fourteen) days. Take 1 tablet every other Sunday with a full glass of water on an empty stomach.    [provider]  buPROPion (WELLBUTRIN XL) 150 MG 24 hr tablet Take 150 mg by mouth daily.    [provider]  doxazosin (CARDURA) 4 MG tablet Take 4 mg by mouth every evening.     Collins,  Gina L, PA-C  ferrous sulfate 325 (65 FE) MG EC tablet Take 1 tablet (325 mg total) by mouth 2 (two) times daily. 07/04/16 07/04/17  Lavina Hamman, MD  finasteride (PROSCAR) 5 MG tablet Take 5 mg by mouth daily.     [provider]  fluticasone (FLONASE) 50 MCG/ACT nasal spray Place 1 spray into both nostrils 2 (two) times daily.     [provider]  furosemide (LASIX) 40 MG tablet Take 1 tablet (40 mg total) by mouth daily. 07/04/16   Lavina Hamman, MD  glyBURIDE ([place])  5 MG tablet Take 2.5-10 mg by mouth 2 (two) times daily with a meal. 2 tablets in the morning and 1/2 tablet in the evening    [provider]  Insulin Glargine (LANTUS) 100 UNIT/ML Solostar Pen Inject 24 Units into the skin daily.     [provider]  loratadine (CLARITIN) 10 MG tablet Take 10 mg by mouth daily.    [provider]  metoprolol succinate (TOPROL-XL) 50 MG 24 hr tablet Take 50 mg by mouth 2 (two) times daily. Take with or immediately following a meal.    [provider]  OVER THE COUNTER MEDICATION Take 1 capsule by mouth 2 (two) times daily. "Higher Thoughts"    [provider]  potassium chloride SA (K-DUR,KLOR-CON) 20 MEQ tablet Take 20 mEq by mouth daily.    [provider]  Salicylic Acid-Cleanser 6 % (Cream) KIT Apply topically. Used as directed    [provider]  simvastatin (ZOCOR) 20 MG tablet Take 20 mg by mouth every evening.    [provider]  traMADol (ULTRAM) 50 MG tablet Take 50 mg by mouth every evening.     [provider]  Umeclidinium-Vilanterol (ANORO ELLIPTA) 62.5-25 MCG/INH AEPB Inhale 1 puff into the lungs daily.    [provider]    Physical Exam:  Vitals:   12/23/16 1400 12/23/16 1415 12/23/16 1445 12/23/16 1500  BP: (!) 152/78 (!) 171/50  (!) 149/66  Pulse: 71 77 69 74  Resp: _0 SpO2: 99% 97% 97% 97%  Weight:      Height:       Constitutional: NAD, calm, very  uncomfortable due to the left shoulder pain. Eyes: PERRL, lids and conjunctivae normal ENMT: Mucous membranes are moist, without exudate or lesions  Neck: normal, supple, no masses, no thyromegaly Respiratory: clear to auscultation bilaterally, no wheezing, no crackles. Normal respiratory effort  Cardiovascular:Irregularly irregular  rate and rhythm with audible click , 2/6  murmur, rubs or gallops. Trace bilateral lower extremity edema. 2+ pedal pulses. No carotid bruits.  Abdomen: Soft, non tender, No hepatosplenomegaly. Bowel sounds positive.  Musculoskeletal: no clubbing / cyanosis. Moves all 3 extremities, but L shoulder is immobilized in sling, unable to assess  Skin: no jaundice, No lesions. significant ecchymosis, bruising on the L shoulder   Neurologic: Sensation intact  Strength equal in all extremities, LUE not assessed due to sling  Psychiatric:   Alert and oriented x 3. Normal mood.     Labs on Admission: I have personally reviewed following labs and imaging studies  CBC:  Recent Labs Lab 12/23/16 1217  WBC 15.2*  HGB 8.0*  HCT 25.3*  MCV 93.0  PLT 104*    Basic Metabolic Panel:  Recent Labs Lab 12/23/16 1217  NA 138  K 3.4*  CL 104  CO2 26  GLUCOSE 257*  BUN 20  CREATININE 1.52*  CALCIUM 8.6*    GFR: Estimated Creatinine Clearance: 33 mL/min (A) (by C-G formula based on SCr of 1.52 mg/dL (H)).  Liver Function Tests: No results for input(s): AST, ALT, ALKPHOS, BILITOT, PROT, ALBUMIN in the last 168 hours. No results for input(s): LIPASE, AMYLASE in the last 168 hours. No results for input(s): AMMONIA in the last 168 hours.  Coagulation Profile:  Recent Labs Lab 12/23/16 1217  INR 2.14    Cardiac Enzymes: No results for input(s): CKTOTAL, CKMB, CKMBINDEX, TROPONINI in the last 168 hours.  BNP (last 3 results) No results for input(s):  PROBNP in the last 8760 hours.  HbA1C: No results for input(s): HGBA1C in the last 72  hours.  CBG:  Recent Labs Lab 12/23/16 1159  GLUCAP 248*    Lipid Profile: No results for input(s): CHOL, HDL, LDLCALC, TRIG, CHOLHDL, LDLDIRECT in the last 72 hours.  Thyroid Function Tests: No results for input(s): TSH, T4TOTAL, FREET4, T3FREE, THYROIDAB in the last 72 hours.  Anemia Panel: No results for input(s): VITAMINB12, FOLATE, FERRITIN, TIBC, IRON, RETICCTPCT in the last 72 hours.  Urine analysis:    Component Value Date/Time   COLORURINE YELLOW 12/13/2013 1556   APPEARANCEUR CLOUDY (A) 12/13/2013 1556   LABSPEC 1.012 12/13/2013 1556   PHURINE 6.0 12/13/2013 1556   GLUCOSEU NEGATIVE 12/13/2013 1556   HGBUR NEGATIVE 12/13/2013 1556   BILIRUBINUR NEGATIVE 12/13/2013 1556   KETONESUR NEGATIVE 12/13/2013 1556   PROTEINUR NEGATIVE 12/13/2013 1556   UROBILINOGEN 1.0 12/13/2013 1556   NITRITE NEGATIVE 12/13/2013 1556   LEUKOCYTESUR NEGATIVE 12/13/2013 1556    Sepsis Labs: '@LABRCNTIP'$ (procalcitonin:4,lacticidven:4) )No results found for this or any previous visit (from the past 240 hour(s)).   Radiological Exams on Admission: Dg Shoulder Left  Result Date: 12/23/2016 CLINICAL DATA:  Fall, left shoulder deformity EXAM: LEFT SHOULDER - 2+ VIEW COMPARISON:  None. FINDINGS: Comminuted left humeral neck fracture, with 3 dominant parts. Associated anterior displacement of the humeral shaft. Visualized soft tissues are within normal limits. Visualized left lung is clear. IMPRESSION: Comminuted three-part left humeral neck fracture, as above. Electronically Signed   By: Julian Hy M.D.   On: 12/23/2016 13:30    EKG: Independently reviewed.  Assessment/Plan Active Problems:   Comminuted left humeral fracture   Diabetes mellitus with complication (HCC)   PVD, Rt SFA PTA/HSRA 06/17/11   AS (aortic stenosis)- s/p tissue AVR 01/07/11   Aspirin allergy, rash, SOB. Pt is on Plavix   Chronic atrial fibrillation (HCC)   COPD (chronic obstructive pulmonary disease)  (HCC)   Chronic diastolic heart failure (HCC)   Chronic renal insufficiency, stage IV (severe) (HCC)   Diabetes mellitus type 2, uncontrolled ([place])   AKI (acute kidney injury) ([place])   Comminuted three part L humeral neck Fracture after mechanical fall, with subsequent intractable pain   Received  IV pain meds  He was immobilized, and orthopedic consultation was obtained. No surgical procedure is to take place. However, his pain continues to the intractable, status requiring admission.  Admit to med surg  SCDs IVF Pain control PT/OT consult Case Manager consult obtained for [facility], as patient lives alone   Leukocytosis, likely reactive, related to  Pain, inflammation . WBC 15 . Afebrile    IVF   Repeat CBC in AM Will hold antibiotics for now    Type II Diabetes Current blood sugar level is 248 Lab Results  Component Value Date   HGBA1C 8.1 (H) 12/13/2013  Hgb A1C Lantus , SSI   Hypertension BP  180/67   Pulse 68    Continue home anti-hypertensive medications in am -took his morning meds   Hyperlipidemia Continue home statins in am   Thrombocytopenia chronic, currently at 104, in the setting of high risk for decrease in value due to hematoma  No transfusion is indicated at this time Monitor counts closely Transfuse 1 unit of platelets if count is less or equal than 10,000 or 20,000 if the patient is acutely bleeding   Anemia of chronic disease Hemoglobin on admission . Baseline Hb 11 Hcult   Repeat CBC in am  No  transfusion is indicated at this time Continue Iron supplements   Chronic kidney disease stage  4  baseline creatinine 1.5  At baseline  Lab Results  Component Value Date   CREATININE 1.52 (H) 12/23/2016   CREATININE 1.86 (H) 07/03/2016   CREATININE 2.20 (H) 07/02/2016  IVF Repeat CMET in am    CAD/ PVD/ Chronic diastolic heart failure/ Atrial fibrillation/ Prosthetic valve on Coumadin   patient is cardiac pain free at this time. PT/ INR 23.7/2.14   Continue meds in the morning, hold Coumadin due to bleeding EKG in am    Depression Continue home Wellbutrin   Benign prostatic hypertrophy, no acute issues Continue Proscar     DVT prophylaxis:  SCD  Code Status:    Full Family Communication:  Discussed with patient Disposition Plan: Expect patient to be discharged to home after condition improves Consults called:    Orthopedics as per EDP Admission status: Inpatient Medsurg    Rondel Jumbo, PA-C [facility]   12/23/2016, 3:46 PM

## 2016-12-23 NOTE — ED Notes (Signed)
Pharmacist notified on pt.'s medications due at 8 pm .

## 2016-12-23 NOTE — ED Triage Notes (Signed)
Pt from home by [facility] EMS for a fall. Pt was putting on jacket and tripped over his feet and fell on left shoulder. Pt was on the floor for about an hour before family found pt. Pt denies LOC or hitting head. Pt is on a blood thinner

## 2016-12-23 NOTE — ED Notes (Signed)
Arm sling (size L) put in room but not applied.

## 2016-12-23 NOTE — ED Notes (Signed)
CBG taken at 17:15 was 224.

## 2016-12-23 NOTE — Consult Note (Signed)
Reason for Consult:Left humerus fx Referring Physician: Raliegh Ip Ronald [name] is an 83 y.o. male with AS, CAD, CHF, COPD, afib on coumadin, and DM. HPI: Ronald [name] was at home trying to put on an overcoat when Ronald [name] lost his balance and fell onto his left side. Ronald [name] had immediate left shoulder pain. Ronald [name] was brought to the ED where x-rays showed a left proximal humerus fx and orthopedic surgery was consulted.  Past Medical History:  Diagnosis Date  . AS (aortic stenosis)    bovine aortic valve replacement 01/07/11  . Bladder cancer [facility])    Bladder Cancer local  . Blood transfusion    w/hip operation  . Cellulitis of left lower extremity   . Chronic atrial fibrillation ([place])   . Chronic diastolic congestive heart failure ([place])   . Claudication in peripheral vascular disease ([place]) 06/17/2011  . COPD (chronic obstructive pulmonary disease) ([place])   . Depression wife died 4 years ago.    Marland Kitchen History of stomach ulcers ~ 1951  . HTN (hypertension)   . Neuropathy due to secondary diabetes ([place])   . Normal coronary arteries Sept 2012  . Peripheral vascular disease ([place]) very poor circulation legs and feet ... stents right and left legs... done in dr j. Gwenlyn Found 's office.   . Pneumonia 07/25/11   left  . Recurrent upper respiratory infection (URI)    sinusitis  . Renal artery stenosis ([place]) 2006   renal artery stent  . S/P angioplasty with stent, diamond back rotational athrectomy Prox. Rt. SFA 06/17/2011 06/17/2011  . Shortness of breath   . Thrombocytopenia due to drugs    seen by Dr Inda Merlin plts [id number] no rx  . Type II diabetes mellitus ([place])     Past Surgical History:  Procedure Laterality Date  . ABDOMINAL ANGIOGRAM  06/17/2011   Procedure: ABDOMINAL ANGIOGRAM;  Surgeon: Lorretta Harp, MD;  Location[facility] CATH LAB;  Service: Cardiovascular;;  . AORTIC VALVE REPLACEMENT  01/07/2011   Procedure: AORTIC VALVE REPLACEMENT (AVR);  Surgeon: Grace Isaac, MD;  Location: [place];  Service: Open  Heart Surgery;  Laterality: N/A;; magna-ease bovine 66m bioprosthesis  . ATHERECTOMY N/A 06/17/2011   Procedure: ATHERECTOMY;  Surgeon: JLorretta Harp MD;  [facility];  Service: Cardiovascular;  Laterality: N/A;  . CARDIAC CATHETERIZATION  11/19/10   normal coronaries, mod AS, 75% l RAS  . CARDIOVERSION  04/03/2011   Procedure: CARDIOVERSION;  Surgeon: DLeonie Man MD;  [place]  Service: Cardiovascular;  Laterality: N/A;  . CATARACT EXTRACTION W/ INTRAOCULAR LENS  IMPLANT, BILATERAL  ~ 2007  . CHOLECYSTECTOMY    . FEMUR IM NAIL  06/27/2011   Procedure: INTRAMEDULLARY (IM) NAIL FEMORAL;  Surgeon: KMarin Shutter MD;  [facility];  Service: Orthopedics;  Laterality: Right;  . FEMUR IM NAIL Left 12/14/2013   Procedure: INTRAMEDULLARY (IM) NAIL FEMORAL;  Surgeon: MMauri Pole MD;  [facility];  Service: Orthopedics;  Laterality: Left;  . LOWER EXTREMITY ANGIOGRAM  06/17/2011   diamondback orbital rotational and cutting balloon atherectomy of the prox R SFA  . LOWER EXTREMITY ANGIOGRAM Bilateral 06/17/2011   Procedure: LOWER EXTREMITY ANGIOGRAM;  Surgeon: JLorretta Harp MD;  [facility];  Service: Cardiovascular;  Laterality: Bilateral;  . PERIPHERAL ARTERIAL STENT GRAFT     2006 left anf right illiac stents Dr BDeon Pilling . RENAL ARTERY STENT  2006   "I believe"  . TONSILLECTOMY AND ADENOIDECTOMY     "  when I was a kid"    Family History  Problem Relation Age of Onset  . Heart disease Mother   . Cancer Brother        colon  . Stroke Father     Social History:  reports that Ronald [name] has been smoking Cigarettes.  Ronald [name] has a 112.50 pack-year smoking history. Ronald [name] has never used smokeless tobacco. Ronald [name] reports that Ronald [name] does not drink alcohol or use drugs.  Allergies:  Allergies  Allergen Reactions  . Aspirin Anaphylaxis, Shortness Of Breath and Rash    "broke out in white welts; red blotches neck and face; windpipe closing; ~ 1962"    Medications: I have  reviewed the patient's current medications.  Results for orders placed or performed during the hospital encounter of 12/23/16 (from the past 48 hour(s))  CBG monitoring, ED     Status: Abnormal   Collection Time: 12/23/16 11:59 AM  Result Value Ref Range   Glucose-Capillary 248 (H) 65 - 99 mg/dL  Protime-INR     Status: Abnormal   Collection Time: 12/23/16 12:17 PM  Result Value Ref Range   Prothrombin Time 23.7 (H) 11.4 - 15.2 seconds   INR 2.14   CBC     Status: Abnormal   Collection Time: 12/23/16 12:17 PM  Result Value Ref Range   WBC 15.2 (H) 4.0 - 10.5 K/uL   RBC 2.72 (L) 4.22 - 5.81 MIL/uL   Hemoglobin 8.0 (L) 13.0 - 17.0 g/dL   HCT 38.6 (L) 73.0 - 52.0 %   MCV 93.0 78.0 - 100.0 fL   MCH 29.4 26.0 - 34.0 pg   MCHC 31.6 30.0 - 36.0 g/dL   RDW 81.5 (H) 86.8 - 51.8 %   Platelets 104 (L) 150 - 400 K/uL    Comment: REPEATED TO VERIFY SPECIMEN CHECKED FOR CLOTS PLATELET COUNT CONFIRMED BY SMEAR   Basic metabolic panel     Status: Abnormal   Collection Time: 12/23/16 12:17 PM  Result Value Ref Range   Sodium 138 135 - 145 mmol/L   Potassium 3.4 (L) 3.5 - 5.1 mmol/L   Chloride 104 101 - 111 mmol/L   CO2 26 22 - 32 mmol/L   Glucose, Bld 257 (H) 65 - 99 mg/dL   BUN 20 6 - 20 mg/dL   Creatinine, Ser 7.99 (H) 0.61 - 1.24 mg/dL   Calcium 8.6 (L) 8.9 - 10.3 mg/dL   GFR calc non Af Amer 38 (L) >60 mL/min   GFR calc Af Amer 44 (L) >60 mL/min    Comment: (NOTE) The eGFR has been calculated using the CKD EPI equation. This calculation has not been validated in all clinical situations. eGFR's persistently <60 mL/min signify possible Chronic Kidney Disease.    Anion gap 8 5 - 15    Dg Shoulder Left  Result Date: 12/23/2016 CLINICAL DATA:  Fall, left shoulder deformity EXAM: LEFT SHOULDER - 2+ VIEW COMPARISON:  None. FINDINGS: Comminuted left humeral neck fracture, with 3 dominant parts. Associated anterior displacement of the humeral shaft. Visualized soft tissues are within  normal limits. Visualized left lung is clear. IMPRESSION: Comminuted three-part left humeral neck fracture, as above. Electronically Signed   By: Charline Bills M.D.   On: 12/23/2016 13:30    Review of Systems  Constitutional: Negative for weight loss.  HENT: Negative for ear discharge, ear pain, hearing loss and tinnitus.   Eyes: Negative for blurred vision, double vision, photophobia and pain.  Respiratory: Negative for cough, sputum production and  shortness of breath.   Cardiovascular: Negative for chest pain.  Gastrointestinal: Negative for abdominal pain, nausea and vomiting.  Genitourinary: Negative for dysuria, flank pain, frequency and urgency.  Musculoskeletal: Positive for joint pain (Left shoulder). Negative for back pain, falls, myalgias and neck pain.  Neurological: Negative for dizziness, tingling, sensory change, focal weakness, loss of consciousness and headaches.  Endo/Heme/Allergies: Does not bruise/bleed easily.  Psychiatric/Behavioral: Negative for depression, memory loss and substance abuse. The patient is not nervous/anxious.    Blood pressure (!) 171/50, pulse 69, resp. rate 15, height '5\' 11"'$  (1.803 m), weight 81.6 kg (180 lb), SpO2 97 %. Physical Exam  Constitutional: Ronald [name] appears well-developed and well-nourished. No distress.  HENT:  Head: Normocephalic.  Eyes: Conjunctivae are normal. Right eye exhibits no discharge. Left eye exhibits no discharge. No scleral icterus.  Neck: Normal range of motion.  Cardiovascular: Normal rate.  An irregularly irregular rhythm present.  Respiratory: Effort normal. No respiratory distress.  Musculoskeletal:  Left shoulder, elbow, wrist, digits- no skin wounds, TTP shoulder, swollen, ecchymotic  Sens  Ax/R/M/U intact  Mot   Ax/ R/ PIN/ M/ AIN/ U intact  Rad 2+   Neurological: Ronald [name] is alert.  Skin: Skin is warm and dry. Ronald [name] is not diaphoretic.  Psychiatric: Ronald [name] has a normal mood and affect. His behavior is normal.     Assessment/Plan: Left proximal 3-part humerus fx -- Will initially treat non-operative with sling and gentle ROM. F/u with Dr. Stann Mainland in 1-2 weeks. Anticoagulated on coumadin -- Hold coumadin until hgb stabilizes. Suggest conversation with PCP about advisability of continuing given his high fall risk.    Lisette Abu, PA-C Orthopedic Surgery [phone] 12/23/2016, 2:59 PM

## 2016-12-24 DIAGNOSIS — W010XXA Fall on same level from slipping, tripping and stumbling without subsequent striking against object, initial encounter: Secondary | ICD-10-CM | POA: Diagnosis present

## 2016-12-24 DIAGNOSIS — I739 Peripheral vascular disease, unspecified: Secondary | ICD-10-CM | POA: Diagnosis not present

## 2016-12-24 DIAGNOSIS — N179 Acute kidney failure, unspecified: Secondary | ICD-10-CM | POA: Diagnosis present

## 2016-12-24 DIAGNOSIS — I2510 Atherosclerotic heart disease of native coronary artery without angina pectoris: Secondary | ICD-10-CM | POA: Diagnosis present

## 2016-12-24 DIAGNOSIS — S42292A Other displaced fracture of upper end of left humerus, initial encounter for closed fracture: Secondary | ICD-10-CM | POA: Diagnosis present

## 2016-12-24 DIAGNOSIS — D638 Anemia in other chronic diseases classified elsewhere: Secondary | ICD-10-CM | POA: Diagnosis present

## 2016-12-24 DIAGNOSIS — E1165 Type 2 diabetes mellitus with hyperglycemia: Secondary | ICD-10-CM | POA: Diagnosis present

## 2016-12-24 DIAGNOSIS — Z9842 Cataract extraction status, left eye: Secondary | ICD-10-CM | POA: Diagnosis not present

## 2016-12-24 DIAGNOSIS — I482 Chronic atrial fibrillation: Secondary | ICD-10-CM | POA: Diagnosis present

## 2016-12-24 DIAGNOSIS — I5032 Chronic diastolic (congestive) heart failure: Secondary | ICD-10-CM | POA: Diagnosis present

## 2016-12-24 DIAGNOSIS — F17210 Nicotine dependence, cigarettes, uncomplicated: Secondary | ICD-10-CM | POA: Diagnosis present

## 2016-12-24 DIAGNOSIS — Z886 Allergy status to analgesic agent status: Secondary | ICD-10-CM | POA: Diagnosis not present

## 2016-12-24 DIAGNOSIS — I130 Hypertensive heart and chronic kidney disease with heart failure and stage 1 through stage 4 chronic kidney disease, or unspecified chronic kidney disease: Secondary | ICD-10-CM | POA: Diagnosis present

## 2016-12-24 DIAGNOSIS — S42352A Displaced comminuted fracture of shaft of humerus, left arm, initial encounter for closed fracture: Secondary | ICD-10-CM | POA: Diagnosis present

## 2016-12-24 DIAGNOSIS — E1151 Type 2 diabetes mellitus with diabetic peripheral angiopathy without gangrene: Secondary | ICD-10-CM | POA: Diagnosis present

## 2016-12-24 DIAGNOSIS — Z961 Presence of intraocular lens: Secondary | ICD-10-CM | POA: Diagnosis present

## 2016-12-24 DIAGNOSIS — J449 Chronic obstructive pulmonary disease, unspecified: Secondary | ICD-10-CM | POA: Diagnosis present

## 2016-12-24 DIAGNOSIS — E1140 Type 2 diabetes mellitus with diabetic neuropathy, unspecified: Secondary | ICD-10-CM | POA: Diagnosis present

## 2016-12-24 DIAGNOSIS — F329 Major depressive disorder, single episode, unspecified: Secondary | ICD-10-CM | POA: Diagnosis present

## 2016-12-24 DIAGNOSIS — E1122 Type 2 diabetes mellitus with diabetic chronic kidney disease: Secondary | ICD-10-CM | POA: Diagnosis present

## 2016-12-24 DIAGNOSIS — D696 Thrombocytopenia, unspecified: Secondary | ICD-10-CM | POA: Diagnosis present

## 2016-12-24 DIAGNOSIS — Z85828 Personal history of other malignant neoplasm of skin: Secondary | ICD-10-CM | POA: Diagnosis not present

## 2016-12-24 DIAGNOSIS — N184 Chronic kidney disease, stage 4 (severe): Secondary | ICD-10-CM | POA: Diagnosis present

## 2016-12-24 DIAGNOSIS — E118 Type 2 diabetes mellitus with unspecified complications: Secondary | ICD-10-CM | POA: Diagnosis not present

## 2016-12-24 DIAGNOSIS — Z7901 Long term (current) use of anticoagulants: Secondary | ICD-10-CM | POA: Diagnosis not present

## 2016-12-24 DIAGNOSIS — D62 Acute posthemorrhagic anemia: Secondary | ICD-10-CM | POA: Diagnosis present

## 2016-12-24 DIAGNOSIS — Z8551 Personal history of malignant neoplasm of bladder: Secondary | ICD-10-CM | POA: Diagnosis not present

## 2016-12-24 DIAGNOSIS — I350 Nonrheumatic aortic (valve) stenosis: Secondary | ICD-10-CM | POA: Diagnosis present

## 2016-12-24 LAB — BASIC METABOLIC PANEL
Anion gap: 8 (ref 5–15)
BUN: 19 mg/dL (ref 6–20)
CALCIUM: 8.3 mg/dL — AB (ref 8.9–10.3)
CO2: 25 mmol/L (ref 22–32)
CREATININE: 1.5 mg/dL — AB (ref 0.61–1.24)
Chloride: 105 mmol/L (ref 101–111)
GFR calc Af Amer: 45 mL/min — ABNORMAL LOW (ref >60)
GFR, EST NON AFRICAN AMERICAN: 39 mL/min — AB (ref >60)
GLUCOSE: 174 mg/dL — AB (ref 65–99)
Potassium: 3 mmol/L — ABNORMAL LOW (ref 3.5–5.1)
Sodium: 138 mmol/L (ref 135–145)

## 2016-12-24 LAB — PREPARE RBC (CROSSMATCH)

## 2016-12-24 LAB — CBC
HCT: 20.5 % — ABNORMAL LOW (ref 39.0–52.0)
HEMOGLOBIN: 6.5 g/dL — AB (ref 13.0–17.0)
MCH: 29.3 pg (ref 26.0–34.0)
MCHC: 31.7 g/dL (ref 30.0–36.0)
MCV: 92.3 fL (ref 78.0–100.0)
PLATELETS: 98 10*3/uL — AB (ref 150–400)
RBC: 2.22 MIL/uL — AB (ref 4.22–5.81)
RDW: 20.1 % — AB (ref 11.5–15.5)
WBC: 8.2 10*3/uL (ref 4.0–10.5)

## 2016-12-24 LAB — GLUCOSE, CAPILLARY
GLUCOSE-CAPILLARY: 184 mg/dL — AB (ref 65–99)
Glucose-Capillary: 184 mg/dL — ABNORMAL HIGH (ref 65–99)
Glucose-Capillary: 221 mg/dL — ABNORMAL HIGH (ref 65–99)

## 2016-12-24 LAB — PROTIME-INR
INR: 2.75
Prothrombin Time: 28.9 seconds — ABNORMAL HIGH (ref 11.4–15.2)

## 2016-12-24 LAB — HEMOGLOBIN AND HEMATOCRIT, BLOOD
HCT: 25.4 % — ABNORMAL LOW (ref 39.0–52.0)
Hemoglobin: 8.4 g/dL — ABNORMAL LOW (ref 13.0–17.0)

## 2016-12-24 MED ORDER — POTASSIUM CHLORIDE CRYS ER 20 MEQ PO TBCR
40.0000 meq | EXTENDED_RELEASE_TABLET | Freq: Once | ORAL | Status: AC
  Administered 2016-12-24: 40 meq via ORAL
  Filled 2016-12-24: qty 2

## 2016-12-24 MED ORDER — SODIUM CHLORIDE 0.9 % IV SOLN
Freq: Once | INTRAVENOUS | Status: AC
  Administered 2016-12-24: 11:00:00 via INTRAVENOUS

## 2016-12-24 MED ORDER — SODIUM CHLORIDE 0.9 % IV SOLN: Freq: Once | INTRAVENOUS | Status: DC

## 2016-12-24 NOTE — Care Management Obs Status (Signed)
[place] NOTIFICATION   Patient Details  Name: VIOLA PLACERES MRN: [id number] Date of Birth: 1923-01-03   Medicare Observation Status Notification Given:  Yes    Carles Collet, RN 12/24/2016, 4:31 PM

## 2016-12-24 NOTE — Care Management Note (Addendum)
Case Management Note  Patient Details  Name: LAKEN LOBATO MRN: [id number] Date of Birth: May 30, 1922  Subjective/Objective:                 Spoke to patient who states that he is going home tomorrow to come back Friday for surgery. States his family will provide support. Confirmed this with his daughter Kurek. She states that patient will have supervision and assistance from family after DC. Patient does not have any DME needs. Has DME electric WC, rails throughout house, 3/1. Declined HH at this time. We discussed support after surgery Friday and she will start working on establishing private duty care for post surgical needs before he comes back.  Aly, Hauser Daughter [phone]  [phone]      Action/Plan:   Expected Discharge Date:   (unknown)               Expected Discharge Plan:  [place]  In-House Referral:  NA  Discharge planning Services  CM Consult  Post Acute Care Choice:  Home Health Choice offered to:  Patient, Adult Children  DME Arranged:    DME Agency:     HH Arranged:    [place] Agency:     Status of Service:  In process, will continue to follow  If discussed at Long Length of Stay Meetings, dates discussed:    Additional Comments:  Carles Collet, RN 12/24/2016, 4:18 PM

## 2016-12-24 NOTE — Progress Notes (Signed)
CRITICAL VALUE ALERT  Critical Value:  Hemoglobin 6.5  Date & Time Notied:  12/24/16 @ 6815  Provider Notified: Dr. Wendee Beavers  Orders Received/Actions taken: Text Paged, awaiting call back/orders.

## 2016-12-24 NOTE — Progress Notes (Signed)
Orthopedic Tech Progress Note Patient Details:  Ronald [name] 04/15/1922 [id number]  [name], male   DOB: December 22, 1922, 83 y.o.   MRN: [id number] Pt has arm sling  Karolee Stamps 12/24/2016, 11:29 PM

## 2016-12-24 NOTE — Progress Notes (Signed)
Subjective:  Patient reports pain as moderate.  He has reasonably controlled pain at rest, but with any motion or jarring of the shoulder he has increased pain. He denies numbness or tingling distally. He denies chest pain or shortness of breath.  Currently receiving a blood transfusion.   Objective:   VITALS:   Vitals:   12/23/16 2201 12/24/16 0531 12/24/16 1030 12/24/16 1105  BP: (!) 155/60 (!) 119/46 (!) 118/36 (!) 124/44  Pulse: 70 60 86 (!) 57  Resp: 16 18 17 17   Temp: (!) 97.5 F (36.4 C) 98 F (36.7 C) 98.3 F (36.8 C) 97.6 F (36.4 C)  TempSrc: Oral Oral Oral Oral  SpO2: 96% 96% 98% 97%  Weight: 183 lb (83 kg)     Height: 5\' 11"  (1.803 m)      Left upper extremity exam:  Ecchymosis and fullness noted in the anterior aspect of the shoulder consistent with hematoma. Tender to palpation along proximal humerus. Otherwise nontender at the elbow or wrist or hand. He does have some ecchymosis noted along the distal brachium to the level of the antecubital fossa.  Otherwise he endorses sensation intact to light touch throughout the axillary, muscular cutaneous, medial antebrachial cutaneous, median, ulnar, radial nerves. Motor intact in the above nerves as well as AIN/PINS. 2+ radial pulse.  Lab Results  Component Value Date   WBC 8.2 12/24/2016   HGB 6.5 (LL) 12/24/2016   HCT 20.5 (L) 12/24/2016   MCV 92.3 12/24/2016   PLT 98 (L) 12/24/2016   BMET    Component Value Date/Time   NA 138 12/24/2016 0546   K 3.0 (L) 12/24/2016 0546   CL 105 12/24/2016 0546   CO2 25 12/24/2016 0546   GLUCOSE 174 (H) 12/24/2016 0546   BUN 19 12/24/2016 0546   CREATININE 1.50 (H) 12/24/2016 0546   CREATININE 1.50 (H) 10/18/2015 1140   CALCIUM 8.3 (L) 12/24/2016 0546   GFRNONAA 39 (L) 12/24/2016 0546   GFRAA 45 (L) 12/24/2016 0546     Assessment/Plan:     Active Problems:   Diabetes mellitus with complication (HCC)   PVD, Rt SFA PTA/HSRA 06/17/11   AS (aortic stenosis)- s/p  tissue AVR 01/07/11   Aspirin allergy, rash, SOB. Pt is on Plavix   Chronic atrial fibrillation (HCC)   COPD (chronic obstructive pulmonary disease) (HCC)   Chronic diastolic heart failure (HCC)   Chronic renal insufficiency, stage IV (severe) (HCC)   Diabetes mellitus type 2, uncontrolled ([place])   AKI (acute kidney injury) ([place])   Comminuted left humeral fracture   Up with therapy - The patient desires operative fixation and management of his left proximal humerus fracture. This is reasonable and I have counseled him on my approach which will be for a reverse shoulder arthroplasty. This will allow for immediate weightbearing postoperatively and hopefully immediate pain relief. We reviewed the risk, benefits, and indications this procedure. These include but are not limited to bleeding, infection, damage to surrounding neurovascular structures, dislocation, need for future surgery, limited mobility of the shoulder, risk of anesthesia, and developed no blood clots.  -  The patient would like to be able to go home before surgery, I think this is reasonable as long as he is safe from a therapy standpoint and medical standpoint to do so.  - Given the upcoming surgery I would like to have him hold any anticoagulation between now and Friday, we can initiate that postoperatively.  We would also like for his hemoglobin to  be as close to baseline preoperatively as possible.  Certainly if he remains in house she now and surgery that is okay as well. I'll defer to the primary team for their discretion on that.   Nicholes Stairs 12/24/2016, 2:06 PM   Geralynn Rile, MD [phone]

## 2016-12-24 NOTE — Evaluation (Addendum)
Physical Therapy Evaluation Patient Details Name: Ronald [name] MRN: [id number] DOB: [birth year] Today's Date: 12/24/2016   History of Present Illness  Pt is a 83 y.o. male admitted on 12/23/16 post-mechanical fall at home; x-rays showed a L proximal humerus fx. Pt currently considering operative (L reverse TSA) vs. non-operative options. Pertinent PMH includes DME, PVD, HTN, aortic stenosis, chronic a-fib, COPD, depression.      Clinical Impression  Pt presents with significant pain, decreased balance, and an overall decrease in functional mobility secondary to above. PTA, pt mod indep with SPC for short distances and electric scooter for longer distances; pt lives alone and was driving; has family who lives nearby who pt reports is available for 24/7 assist if needed. Today, pt required min-modA+2 to stand and pivot to chair; further mobility limited by significant LUE pain with all mobility. Pt reports he plans to have shoulder sx this Friday and is very adamant about wanting to return home by tomorrow morning in order to get his will and other documents sorted out prior to this sx. Pt made aware of PT recommendations that he is not safe to return home with current status. Feel pt would benefit from continued acute PT services and follow-up PT services in SNF-setting prior to d/c home. If pt is to return home, would require 24/7 assist from family for transfers and mobility w/ manual w/c secondary to pain and inability to use LUE; pt aware of this. Will continue to follow to further assess pt's mobility status and reassess d/c recommendations post-sx.    Follow Up Recommendations SNF;Supervision for mobility/OOB    Equipment Recommendations  None recommended by PT    Recommendations for Other Services       Precautions / Restrictions Precautions Precautions: Fall;Shoulder Type of Shoulder Precautions: spoke with ortho PA for clarification on ROM orders, per ortho PA, okay for gentle PROM  (pendulums only) to L shoulder  Shoulder Interventions: Shoulder sling/immobilizer;Off for dressing/bathing/exercises Precaution Comments: verbally reviewed shoulder precautions Restrictions Weight Bearing Restrictions: Yes LUE Weight Bearing: Non weight bearing      Mobility  Bed Mobility Overal bed mobility: Needs Assistance Bed Mobility: Supine to Sit     Supine to sit: Mod assist     General bed mobility comments: ModA to bring trunk into upright position and to scoot hips towards EOB  Transfers Overall transfer level: Needs assistance Equipment used: 1 person hand held assist Transfers: Sit to/from [facility] Sit to Stand: Mod assist;+2 physical assistance;+2 safety/equipment;From elevated surface Stand pivot transfers: Min assist;Mod assist;+2 physical assistance;+2 safety/equipment       General transfer comment: Initially attempted standing with SPC, but pt unable to tolerate this with LUE pain. Pt utilizing 1 person HHA with +2 assist for boosting into standing and steadying, Pt requires increased effort and encouragement as pt experiencing increased pain in LUE with attempts to stand (elevated bed for added assist); Pt able to maintain static standing with MinA, requires Min-ModA +2 to provide steady assist when pivoting to recliner   Ambulation/Gait                Stairs            Wheelchair Mobility    Modified Rankin (Stroke Patients Only)       Balance Overall balance assessment: Needs assistance Sitting-balance support: No upper extremity supported;Feet supported Sitting balance-Leahy Scale: Fair     Standing balance support: Single extremity supported Standing balance-Leahy Scale: Poor Standing balance comment: Reliant  on RUE support                              Pertinent Vitals/Pain Pain Assessment: Faces Faces Pain Scale: Hurts worst Pain Location: LUE Pain Descriptors / Indicators:  Aching;Grimacing;Guarding Pain Intervention(s): Limited activity within patient's tolerance;Monitored during session;Repositioned    Home Living Family/patient expects to be discharged to:: Private residence Living Arrangements: Alone Available Help at Discharge: Family Type of Home: House Home Access: Ramped entrance     Home Layout: One level Home Equipment: Environmental consultant - 2 wheels;Walker - 4 wheels;Cane - single point;Electric scooter;Grab bars - tub/shower;Wheelchair - manual Additional Comments: Pt reports if he were to d/c home prior to potential sx this Friday, family could stay with him 24/7    Prior Function Level of Independence: Independent with assistive device(s)         Comments: Reports able to amb ~30 yards with [facility] (switches between [place]), but uses electric scooter for longer household and community distances. pt drives     Hand Dominance   Dominant Hand: Right    Extremity/Trunk Assessment   Upper Extremity Assessment Upper Extremity Assessment: LUE deficits/detail;Generalized weakness LUE Deficits / Details: Pt able to tolerate AAROM to L wrist and digits, unable to complete further P/AAROM due to increased pain in LUE  LUE: Unable to fully assess due to pain;Unable to fully assess due to immobilization    Lower Extremity Assessment Lower Extremity Assessment: Generalized weakness    Cervical / Trunk Assessment Cervical / Trunk Assessment: Kyphotic  Communication   Communication: HOH  Cognition Arousal/Alertness: Awake/alert Behavior During Therapy: WFL for tasks assessed/performed Overall Cognitive Status: Within Functional Limits for tasks assessed                                 General Comments: Pt reports he has to d/c this evening or tomorrow morning (10/31) so that he can get home to get his living will in order prior to his sx on Friday. Although family has agreed to bring this to him in hospital, he prefers to do it himself and is  adament about returning home      General Comments      Exercises Shoulder Exercises Wrist Flexion: AAROM;10 reps;Left;Seated Wrist Extension: AAROM;10 reps;Left;Seated Digit Composite Flexion: AROM;10 reps;Left;Seated Composite Extension: AROM;10 reps;Left;Seated Donning/doffing shirt without moving shoulder: Maximal assistance Method for sponge bathing under operated UE: Moderate assistance Donning/doffing sling/immobilizer: Maximal assistance Correct positioning of sling/immobilizer: Moderate assistance Sling wearing schedule (on at all times/off for ADL's): Minimal assistance Proper positioning of operated UE when showering: Minimal assistance Positioning of UE while sleeping: Moderate assistance   Assessment/Plan    PT Assessment Patient needs continued PT services  PT Problem List Decreased strength;Decreased range of motion;Decreased activity tolerance;Decreased balance;Decreased mobility;Decreased knowledge of use of DME;Decreased knowledge of precautions;Pain       PT Treatment Interventions DME instruction;Gait training;Stair training;Functional mobility training;Therapeutic activities;Therapeutic exercise;Balance training;Patient/family education;Wheelchair mobility training    PT Goals (Current goals can be found in the Care Plan section)  Acute Rehab PT Goals Patient Stated Goal: Return home ASAP PT Goal Formulation: With patient Time For Goal Achievement: 01/07/17 Potential to Achieve Goals: Fair    Frequency Min 3X/week   Barriers to discharge Decreased caregiver support      Co-evaluation PT/OT/SLP Co-Evaluation/Treatment: Yes Reason for Co-Treatment: For patient/therapist safety;To address functional/ADL transfers PT  goals addressed during session: Mobility/safety with mobility;Balance;Proper use of DME OT goals addressed during session: ADL's and self-care       AM-PAC PT "6 Clicks" Daily Activity  Outcome Measure Difficulty turning over in bed  (including adjusting bedclothes, sheets and blankets)?: Unable Difficulty moving from lying on back to sitting on the side of the bed? : Unable Difficulty sitting down on and standing up from a chair with arms (e.g., wheelchair, bedside commode, etc,.)?: Unable Help needed moving to and from a bed to chair (including a wheelchair)?: A Little Help needed walking in hospital room?: A Little Help needed climbing 3-5 steps with a railing? : A Lot 6 Click Score: 11    End of Session Equipment Utilized During Treatment: Gait belt Activity Tolerance: Patient limited by pain Patient left: in chair;with call bell/phone within reach;with chair alarm set Nurse Communication: Mobility status PT Visit Diagnosis: Other abnormalities of gait and mobility (R26.89);Muscle weakness (generalized) (M62.81);Pain Pain - Right/Left: Left Pain - part of body: Shoulder    Time: 6244-6950 PT Time Calculation (min) (ACUTE ONLY): 28 min   Charges:   PT Evaluation $PT Eval Moderate Complexity: 1 Mod PT Treatments $Therapeutic Activity: 8-22 mins   PT G Codes:   PT G-Codes **NOT FOR INPATIENT CLASS** Functional Assessment Tool Used: AM-PAC 6 Clicks Basic Mobility Functional Limitation: Mobility: Walking and moving around Mobility: Walking and Moving Around Current Status (H2257): At least 60 percent but less than 80 percent impaired, limited or restricted Mobility: Walking and Moving Around Goal Status [phone]): At least 1 percent but less than 20 percent impaired, limited or restricted   Mabeline Caras, PT, DPT Acute Rehab Services  Pager: [place] 12/24/2016, 6:08 PM

## 2016-12-24 NOTE — Evaluation (Signed)
Occupational Therapy Evaluation Patient Details Name: Ronald [name] MRN: [id number] DOB: 10-29-1922 Today's Date: 12/24/2016    History of Present Illness Pt is a 83 y.o. male admitted on 12/23/16 post-mechanical fall at home; x-rays showed a L proximal humerus fx. Pt currently considering operative (L reverse TSA) vs. non-operative options. Pertinent PMH includes DME, PVD, HTN, aortic stenosis, chronic a-fib, COPD, depression.     Clinical Impression   This 83 y/o M presents with the above. Pt lives alone, at baseline is mod independent with ADLs and functional mobility, using SPC for short distances and power chair for longer distances, was driving. Pt presents with significant pain in LUE, decreased dynamic standing balance, and decreased ability to complete ADLs and functional mobility. Pt required Min-ModA +2 for stand pivot transfers this session, requires MaxA for UB/LB ADLs secondary to LUE functional limitations and pain. Education provided on L shoulder precautions and compensatory techniques for completing ADLs. Pt currently planning for surgery this Friday; based on Pt's current status feel Pt would benefit from further ST rehab OT services in SNF setting prior to return home. Pt currently is strongly wishing to return home prior to surgery. Recommend Pt receive further acute OT services, family education and 24 hr assist at home if Pt is to return home. Will continue to follow to further assess Pt's ADL/mobility status and re-assess d/c recommendations after Pt's surgery.     Follow Up Recommendations  SNF;Supervision/Assistance - 24 hour    Equipment Recommendations  None recommended by OT           Precautions / Restrictions Precautions Precautions: Fall;Shoulder Type of Shoulder Precautions: spoke with ortho PA for clarification on ROM orders, per ortho PA, okay for gentle PROM (pendulums only) to L shoulder  Shoulder Interventions: Shoulder sling/immobilizer;Off for  dressing/bathing/exercises Precaution Comments: verbally reviewed shoulder precautions Restrictions Weight Bearing Restrictions: Yes LUE Weight Bearing: Non weight bearing      Mobility Bed Mobility Overal bed mobility: Needs Assistance Bed Mobility: Supine to Sit     Supine to sit: Mod assist     General bed mobility comments: assist to bring trunk into upright position and to scoot hips towards EOB  Transfers Overall transfer level: Needs assistance Equipment used: 1 person hand held assist Transfers: Sit to/from [employer] Transfers Sit to Stand: Mod assist;+2 physical assistance;+2 safety/equipment;From elevated surface Stand pivot transfers: Min assist;Mod assist;+2 physical assistance;+2 safety/equipment       General transfer comment: Pt utilizing 1 person HHA with +2 assist for boosting into standing and steadying, Pt requires increased effort and encouragement as pt experiencing increased pain in LUE with attempts to stand (elevated bed for added assist); Pt able to maintain static standing with MinA, requires Min-ModA +2 to provide steady assist when pivoting to recliner     Balance Overall balance assessment: Needs assistance Sitting-balance support: No upper extremity supported;Feet supported Sitting balance-Leahy Scale: Fair     Standing balance support: Single extremity supported Standing balance-Leahy Scale: Poor Standing balance comment: Reliant on RUE support                            ADL either performed or assessed with clinical judgement   ADL Overall ADL's : Needs assistance/impaired Eating/Feeding: Set up;Sitting   Grooming: Minimal assistance;Sitting   Upper Body Bathing: Minimal assistance;Sitting   Lower Body Bathing: Moderate assistance;Sit to/from stand;+2 for safety/equipment   Upper Body Dressing : Maximal assistance;Sitting Upper Body Dressing  Details (indicate cue type and reason): MaxA to adjust sling seated EOB  and in recliner; demonstrated UB dressing techniques however Pt declining to practice this session due to pain  Lower Body Dressing: Maximal assistance;+2 for safety/equipment;Sit to/from stand Lower Body Dressing Details (indicate cue type and reason): total assist to don socks seated EOB as Pt having increased pain in LUE with leaning forward (towards feet)  Toilet Transfer: Minimal assistance;+2 for safety/equipment;+2 for physical assistance;Stand-pivot;BSC Toilet Transfer Details (indicate cue type and reason): simulated in transfer to [place] and Hygiene: Maximal assistance;Sit to/from stand       Functional mobility during ADLs: Moderate assistance;+2 for physical assistance;+2 for safety/equipment General ADL Comments: educated Pt regarding shoulder precautions and compensatory techniques for completing ADLs; Pt will benefit from further review and practice prior to return home, feel family education would be beneficial to ensure safe ADL completion at home                         Pertinent Vitals/Pain Pain Assessment: Faces Faces Pain Scale: Hurts worst Pain Location: LUE Pain Descriptors / Indicators: Aching;Grimacing;Guarding Pain Intervention(s): Limited activity within patient's tolerance;Monitored during session;Repositioned     Hand Dominance Right   Extremity/Trunk Assessment Upper Extremity Assessment Upper Extremity Assessment: LUE deficits/detail;Generalized weakness LUE Deficits / Details: Pt able to tolerate AAROM to L wrist and digits, unable to complete further P/AAROM due to increased pain in LUE  LUE: Unable to fully assess due to pain;Unable to fully assess due to immobilization   Lower Extremity Assessment Lower Extremity Assessment: Generalized weakness   Cervical / Trunk Assessment Cervical / Trunk Assessment: Kyphotic   Communication Communication Communication: HOH   Cognition Arousal/Alertness:  Awake/alert Behavior During Therapy: WFL for tasks assessed/performed Overall Cognitive Status: Within Functional Limits for tasks assessed                                 General Comments: Pt reports he has to d/c this evening or tomorrow morning (10/31) so that he can get home to get his living will in order prior to his sx on Friday. Although family has agreed to bring this to him in hospital, he prefers to do it himself and is adament about returning home   General Comments       Exercises Shoulder Exercises Wrist Flexion: AAROM;10 reps;Left;Seated Wrist Extension: AAROM;10 reps;Left;Seated Digit Composite Flexion: AROM;10 reps;Left;Seated Composite Extension: AROM;10 reps;Left;Seated   Shoulder Instructions Shoulder Instructions Donning/doffing shirt without moving shoulder: Maximal assistance Method for sponge bathing under operated UE: Moderate assistance Donning/doffing sling/immobilizer: Maximal assistance Correct positioning of sling/immobilizer: Moderate assistance Sling wearing schedule (on at all times/off for ADL's): Minimal assistance Proper positioning of operated UE when showering: Minimal assistance Positioning of UE while sleeping: Moderate assistance    Home Living Family/patient expects to be discharged to:: Private residence Living Arrangements: Alone Available Help at Discharge: Family Type of Home: House Home Access: [place]: One level     Bathroom Shower/Tub: Teacher, early years/pre: [place]: Environmental consultant - 2 wheels;Walker - 4 wheels;Cane - single point;Electric scooter;Grab bars - tub/shower;Wheelchair - manual   Additional Comments: Pt reports if he were to d/c home prior to potential sx this Friday, family could stay with him 24/7      Prior Functioning/Environment Level of Independence:  Independent with assistive device(s)        Comments: Reports able to amb ~30 yards with  [facility] (switches between [place]), but uses electric scooter for longer household and community distances. pt drives        OT Problem List: Impaired UE functional use;Pain;Decreased strength;Impaired balance (sitting and/or standing);Decreased range of motion;Decreased knowledge of precautions      OT Treatment/Interventions: Self-care/ADL training;DME and/or AE instruction;Therapeutic activities;Balance training;Therapeutic exercise;Energy conservation;Patient/family education    OT Goals(Current goals can be found in the care plan section) Acute Rehab OT Goals Patient Stated Goal: Return home ASAP OT Goal Formulation: With patient Time For Goal Achievement: 01/07/17 Potential to Achieve Goals: Good  OT Frequency: Min 2X/week               Co-evaluation PT/OT/SLP Co-Evaluation/Treatment: Yes Reason for Co-Treatment: For patient/therapist safety;To address functional/ADL transfers PT goals addressed during session: Mobility/safety with mobility;Balance;Proper use of DME OT goals addressed during session: ADL's and self-care      AM-PAC PT "6 Clicks" Daily Activity     Outcome Measure Help from another person eating meals?: A Little Help from another person taking care of personal grooming?: A Little Help from another person toileting, which includes using toliet, bedpan, or urinal?: A Lot Help from another person bathing (including washing, rinsing, drying)?: A Lot Help from another person to put on and taking off regular upper body clothing?: A Lot Help from another person to put on and taking off regular lower body clothing?: A Lot 6 Click Score: 14   End of Session Equipment Utilized During Treatment: Gait belt;Other (comment) (L shoulder sling ) Nurse Communication: Mobility status  Activity Tolerance: Patient tolerated treatment well;Patient limited by pain Patient left: in chair;with call bell/phone within reach;with chair alarm set  OT Visit Diagnosis: Unsteadiness on  feet (R26.81);History of falling (Z91.81);Muscle weakness (generalized) (M62.81);Pain Pain - Right/Left: Left Pain - part of body: Shoulder                Time: 3790-2409 OT Time Calculation (min): 69 min Charges:  OT General Charges $OT Visit: 1 Visit OT Evaluation $OT Eval Moderate Complexity: 1 Mod OT Treatments $Self Care/Home Management : 8-22 mins G-Codes: OT G-codes **NOT FOR INPATIENT CLASS** Functional Assessment Tool Used: AM-PAC 6 Clicks Daily Activity;Clinical judgement Functional Limitation: Self care Self Care Current Status (B3532): At least 60 percent but less than 80 percent impaired, limited or restricted Self Care Goal Status (D9242): At least 1 percent but less than 20 percent impaired, limited or restricted   Lou Cal, OT Pager [phone] 12/24/2016   Raymondo Band 12/24/2016, 4:43 PM

## 2016-12-24 NOTE — Progress Notes (Signed)
OT Cancellation Note  Patient Details Name: Ronald [name] MRN: [id number] DOB: Jul 06, 1922   Cancelled Treatment:    Reason Eval/Treat Not Completed: Medical issues which prohibited therapy; Pt with low Hgb levels this AM, will be receiving transfusion shortly. Will follow up as able.   Lou Cal, OT Pager [phone] 12/24/2016   Raymondo Band 12/24/2016, 10:24 AM

## 2016-12-24 NOTE — Progress Notes (Signed)
PROGRESS NOTE    Ronald [name]  CNO:[id number] DOB: [birth year] DOA: 12/23/2016 PCP: [name], MD   Brief Narrative: 83 y.o. male with AS, CAD, CHF, COPD, afib on coumadin, and DM. HPI: Ronald [name] was at home trying to put on an overcoat when he lost his balance and fell onto his left side. He had immediate left shoulder pain. He was brought to the ED where x-rays showed a left proximal humerus fx and orthopedic surgery was consulted   Assessment & Plan:    Comminuted left humeral fracture - Ortho on board. Pt considering non operative vs operative options. Operative care would be a reverse shoulder arthroplasty  Active Problems:   Diabetes mellitus with complication (HCC) - Pt is on lantus, diabetic diet, and SSI sensitive scale.    PVD, Rt SFA PTA/HSRA 06/17/11    AS (aortic stenosis)- s/p tissue AVR 01/07/11    Aspirin allergy, rash, SOB. Pt is on Plavix    Chronic atrial fibrillation (HCC) - Will rate control on metoprolol - coumadin on hold.    COPD (chronic obstructive pulmonary disease) (HCC) - stable currently    Chronic diastolic heart failure ([place]) - appears euvolemic. Continue current medication regimen.    Chronic renal insufficiency, stage IV (severe) (HCC) - stable at 1.5 at baseline    DVT prophylaxis: SCD's Code Status: Full Family Communication: d/c patient and family at bedside. Disposition Plan: pending   Consultants:   Ortho: Dr. Saralyn Pilar   Procedures: possible procedure this friday   Antimicrobials: None   Subjective: Pt has no new complaints, no acute issues overnight. Is leaning more towards surgical repair of shoulder  Objective: Vitals:   12/24/16 0531 12/24/16 1030 12/24/16 1105 12/24/16 1430  BP: (!) 119/46 (!) 118/36 (!) 124/44 (!) 103/52  Pulse: 60 86 (!) 57 78  Resp: 18 17 17 16   Temp: 98 F (36.7 C) 98.3 F (36.8 C) 97.6 F (36.4 C) 98.2 F (36.8 C)  TempSrc: Oral Oral Oral Oral  SpO2: 96% 98% 97% 97%  Weight:        Height:        Intake/Output Summary (Last 24 hours) at 12/24/16 1539 Last data filed at 12/24/16 0531  Gross per 24 hour  Intake                0 ml  Output              700 ml  Net             -700 ml   Filed Weights   12/23/16 1156 12/23/16 2201  Weight: 81.6 kg (180 lb) 83 kg (183 lb)    Examination:  General exam: Appears calm and comfortable, in nad. Respiratory system: Clear to auscultation. Respiratory effort normal. Cardiovascular system: S1 & S2 heard, RRR. No JVD, murmurs, rubs Gastrointestinal system: Abdomen is nondistended, soft and nontender. No organomegaly or masses felt. Normal bowel sounds heard. Central nervous system: Alert and oriented. No focal neurological deficits. Extremities: warm, no cyanosis, left arm in sling Skin: No rashes, lesions or ulcers, on limited exam. Psychiatry:  Mood & affect appropriate.     Data Reviewed: I have personally reviewed following labs and imaging studies  CBC:  Recent Labs Lab 12/23/16 1217 12/24/16 0546  WBC 15.2* 8.2  HGB 8.0* 6.5*  HCT 25.3* 20.5*  MCV 93.0 92.3  PLT 104* 98*   Basic Metabolic Panel:  Recent Labs Lab 12/23/16 1217 12/24/16 0546  NA 138 138  K 3.4* 3.0*  CL 104 105  CO2 26 25  GLUCOSE 257* 174*  BUN 20 19  CREATININE 1.52* 1.50*  CALCIUM 8.6* 8.3*   GFR: Estimated Creatinine Clearance: 33.5 mL/min (A) (by C-G formula based on SCr of 1.5 mg/dL (H)). Liver Function Tests: No results for input(s): AST, ALT, ALKPHOS, BILITOT, PROT, ALBUMIN in the last 168 hours. No results for input(s): LIPASE, AMYLASE in the last 168 hours. No results for input(s): AMMONIA in the last 168 hours. Coagulation Profile:  Recent Labs Lab 12/23/16 1217 12/24/16 0546  INR 2.14 2.75   Cardiac Enzymes: No results for input(s): CKTOTAL, CKMB, CKMBINDEX, TROPONINI in the last 168 hours. BNP (last 3 results) No results for input(s): PROBNP in the last 8760 hours. HbA1C:  Recent Labs   12/23/16 1637  HGBA1C 6.2*   CBG:  Recent Labs Lab 12/23/16 1159 12/23/16 1714 12/23/16 2020 12/24/16 1232  GLUCAP 248* 224* 250* 184*   Lipid Profile: No results for input(s): CHOL, HDL, LDLCALC, TRIG, CHOLHDL, LDLDIRECT in the last 72 hours. Thyroid Function Tests: No results for input(s): TSH, T4TOTAL, FREET4, T3FREE, THYROIDAB in the last 72 hours. Anemia Panel: No results for input(s): VITAMINB12, FOLATE, FERRITIN, TIBC, IRON, RETICCTPCT in the last 72 hours. Sepsis Labs: No results for input(s): PROCALCITON, LATICACIDVEN in the last 168 hours.  No results found for this or any previous visit (from the past 240 hour(s)).       Radiology Studies: Dg Shoulder Left  Result Date: 12/23/2016 CLINICAL DATA:  Fall, left shoulder deformity EXAM: LEFT SHOULDER - 2+ VIEW COMPARISON:  None. FINDINGS: Comminuted left humeral neck fracture, with 3 dominant parts. Associated anterior displacement of the humeral shaft. Visualized soft tissues are within normal limits. Visualized left lung is clear. IMPRESSION: Comminuted three-part left humeral neck fracture, as above. Electronically Signed   By: Julian Hy M.D.   On: 12/23/2016 13:30        Scheduled Meds: . buPROPion  150 mg Oral Daily  . doxazosin  4 mg Oral QPM  . ferrous sulfate  325 mg Oral BID  . finasteride  5 mg Oral Daily  . furosemide  40 mg Oral Daily  . insulin aspart  0-9 Units Subcutaneous TID WC  . insulin glargine  24 Units Subcutaneous Daily  . loratadine  10 mg Oral Daily  . metoprolol succinate  50 mg Oral BID  . potassium chloride SA  20 mEq Oral Daily  . simvastatin  20 mg Oral QPM  . traMADol  50 mg Oral QPM  . umeclidinium-vilanterol  1 puff Inhalation Daily   Continuous Infusions: . sodium chloride 75 mL/hr at 12/24/16 0547  . sodium chloride       LOS: 0 days    Time spent: > 35 minutes  Velvet Bathe, MD [facility] Pager [phone]  If 7PM-7AM, please contact  night-coverage [email or web address] Password [email or web address] 12/24/2016, 3:39 PM

## 2016-12-25 ENCOUNTER — Inpatient Hospital Stay (HOSPITAL_COMMUNITY): Payer: PPO

## 2016-12-25 ENCOUNTER — Ambulatory Visit (HOSPITAL_COMMUNITY): Admission: RE | Admit: 2016-12-25 | Payer: PPO | Source: Ambulatory Visit

## 2016-12-25 DIAGNOSIS — I482 Chronic atrial fibrillation: Secondary | ICD-10-CM

## 2016-12-25 DIAGNOSIS — I739 Peripheral vascular disease, unspecified: Secondary | ICD-10-CM

## 2016-12-25 DIAGNOSIS — I350 Nonrheumatic aortic (valve) stenosis: Secondary | ICD-10-CM

## 2016-12-25 DIAGNOSIS — E118 Type 2 diabetes mellitus with unspecified complications: Secondary | ICD-10-CM

## 2016-12-25 DIAGNOSIS — N179 Acute kidney failure, unspecified: Secondary | ICD-10-CM

## 2016-12-25 DIAGNOSIS — S42292A Other displaced fracture of upper end of left humerus, initial encounter for closed fracture: Secondary | ICD-10-CM

## 2016-12-25 DIAGNOSIS — N184 Chronic kidney disease, stage 4 (severe): Secondary | ICD-10-CM

## 2016-12-25 LAB — GLUCOSE, CAPILLARY
GLUCOSE-CAPILLARY: 155 mg/dL — AB (ref 65–99)
Glucose-Capillary: 275 mg/dL — ABNORMAL HIGH (ref 65–99)

## 2016-12-25 LAB — BPAM RBC
BLOOD PRODUCT EXPIRATION DATE: 201811062359
ISSUE DATE / TIME: 201810301020
UNIT TYPE AND RH: 5100

## 2016-12-25 LAB — TYPE AND SCREEN
ABO/RH(D): O POS
ANTIBODY SCREEN: NEGATIVE
UNIT DIVISION: 0

## 2016-12-25 MED ORDER — SENNOSIDES-DOCUSATE SODIUM 8.6-50 MG PO TABS: 1.0000 | ORAL_TABLET | Freq: Every day | ORAL | 0 refills | Status: DC

## 2016-12-25 MED ORDER — HYDROCODONE-ACETAMINOPHEN 5-325 MG PO TABS: 1.0000 | ORAL_TABLET | ORAL | 0 refills | Status: DC | PRN

## 2016-12-25 MED ORDER — POLYETHYLENE GLYCOL 3350 17 G PO PACK: 17.0000 g | PACK | Freq: Every day | ORAL | 0 refills | Status: DC

## 2016-12-25 NOTE — Care Management Note (Signed)
Case Management Note Original note by: Carles Collet, RN 12/24/2016, 4:18 PM  Patient Details  Name: Ronald [name] MRN: [id number] Date of Birth: Aug 24, 1922  Subjective/Objective:                 Spoke to patient who states that he is going home tomorrow to come back Friday for surgery. States his family will provide support. Confirmed this with his daughter Holzmann. She states that patient will have supervision and assistance from family after DC. Patient does not have any DME needs. Has DME electric WC, rails throughout house, 3/1. Declined HH at this time. We discussed support after surgery Friday and she will start working on establishing private duty care for post surgical needs before he comes back.  Johne, [place] Daughter [phone]  [phone]      Action/Plan:   Expected Discharge Date:  12/25/16               Expected Discharge Plan:  [place]  In-House Referral:  NA  Discharge planning Services  CM Consult  Post Acute Care Choice:  Home Health Choice offered to:  Patient, Adult Children  DME Arranged:    DME Agency:     HH Arranged:    [place] Agency:     Status of Service:  Completed, signed off  If discussed at [employer] of Stay Meetings, dates discussed:    Additional Comments:  12/25/16 J. Tinleigh Whitmire, RN, BSN Pt with dc order; pt/ family declines any HH or DME set up at this time, as pt returning on Friday for additional surgery.  Family able to provide 24h care at discharge.  Confirmed with daughter [place] that pt has all needed DME; they do not want a manual wheelchair for home, as he has electric WC.    Reinaldo Raddle, RN, BSN  Trauma/Neuro ICU Case Manager [phone]

## 2016-12-25 NOTE — Discharge Summary (Addendum)
Physician Discharge Summary  Ronald [name] SWF:[id number] DOB: 04/28/1922 DOA: 12/23/2016  PCP: [name], MD  Admit date: 12/23/2016 Discharge date: 12/25/2016  Admitted From: Home Disposition: Home  Recommendations for Outpatient Follow-up:  1. Follow up with PCP in 1 week 2. Return for surgery on 12/27/2016 3. Fosamax discontinued on discharge. Consider discontinuing Fosamax or rechecking kidney function 4. Please follow up on the following pending results: None  Home Health: Patient declined SNF and home health Equipment/Devices: None  Discharge Condition: Guarded since sent to home CODE STATUS: Full code Diet recommendation: Heart healthy   Brief/Interim Summary:  Admission HPI written by Sharene Butters, PA-C and Waldemar Dickens, MD   Chief Complaint: Left shoulder pain.  HPI: Ronald [name] is a 83 y.o. male with medical history significant for AS s/p bovine AVR, CDHF, CAF, COPD< Depression, HTN, thrombocytopenia, DM, brought to the ED after sustaining a mechanical fall while putting his coat on, falling on his left shoulder. Patient sustained significant pain and swelling to the left shoulder. He denies any dizziness or lightheadedness, vertigo  prior to the fall. He did not hit his head or loss consciousness. No compression was reported. The patient denies any headaches. He denies any pain anywhere else. He denies any numbness or unilateral weakness. He denies any shortness of breath, or chest pain. He denies any significant lower extremity swelling. He reports having had several fractures in the past, last one about one year ago to the elbow. He denies any dysuria, or gross hematuria. He denies any fever, chills or night sweats.   ED Course:  BP (!) 149/66   Pulse 74   Resp 11   Ht 5\' 11"  (1.803 m)   Wt 81.6 kg (180 lb)   SpO2 97%   BMI 25.10 kg/m    He received a total of 200 g of fentanyl by EMS, and still complaining of pain. He also received Dilaudid 0.5 mg  IV 1. He was immobilized, and orthopedic consultation was obtained. No surgical procedure is to take place. However, his pain continues to the intractable, status requiring admission.    Hospital course:  Comminuted left humeral fracture Orthopedics saw and evaluated the patient.  Initially were considering nonoperative versus operative options and settled on reverse shoulder arthroplasty.  Surgery was scheduled for this Friday, December 27, 2016, however patient desired to be discharged home prior to the operation so he can handle personal affairs with regard to his well.  PT and OT recommended SNF discharge discharge as he required 24-hour supervision and did not recommend home discharge.  Orthopedic surgery was okay with discharge pending recommendations prolonged discussions with patient, patient is adamant that he will go home in order to handle his affairs prior to having his surgery this Friday.  He is refusing SNF discharge prior to his surgery and prior to handling his personal affairs.  Case management spoke with patient's daughter who agreed that he would have 24-hour supervision at home.  I personally called the patient's emergency contact, his son, who confirmed that the patient would have 24-hour supervision between the patient's 3 children and his daughter-in-law.  Patient plans to return on Friday for his surgery.  Aortic stenosis Status post AVR in 2012.  Chronic atrial fibrillation Rate controlled.  Continue metoprolol.  Coumadin held secondary to anticipated surgery.  COPD Stable.  Chronic diastolic heart failure Euvolemic.  CKD stage 4 Stable.  Discharge Diagnoses:  Active Problems:   Diabetes mellitus with complication ([place])  PVD, Rt SFA PTA/HSRA 06/17/11   AS (aortic stenosis)- s/p tissue AVR 01/07/11   Aspirin allergy, rash, SOB. Pt is on Plavix   Chronic atrial fibrillation ([facility])   COPD (chronic obstructive pulmonary disease) ([facility])   Chronic diastolic heart  failure ([facility])   Chronic renal insufficiency, stage IV (severe) ([place])   Diabetes mellitus type 2, uncontrolled ([place])   AKI (acute kidney injury) ([place])   Comminuted left humeral fracture    Discharge Instructions   Allergies as of 12/25/2016      Reactions   Aspirin Anaphylaxis, Shortness Of Breath, Rash   "broke out in white welts; red blotches neck and face; windpipe closing; ~ 1962"      Medication List    STOP taking these medications   alendronate 70 MG tablet Commonly known as:  FOSAMAX   warfarin 2 MG tablet Commonly known as:  COUMADIN     TAKE these medications   ANORO ELLIPTA 62.5-25 MCG/INH Aepb Generic drug:  umeclidinium-vilanterol Inhale 1 puff into the lungs daily.   buPROPion 150 MG 24 hr tablet Commonly known as:  WELLBUTRIN XL Take 150 mg by mouth daily.   doxazosin 4 MG tablet Commonly known as:  CARDURA Take 4 mg by mouth every evening.   ferrous sulfate 325 (65 FE) MG EC tablet Take 1 tablet (325 mg total) by mouth 2 (two) times daily.   finasteride 5 MG tablet Commonly known as:  PROSCAR Take 5 mg by mouth daily.   fluticasone 50 MCG/ACT nasal spray Commonly known as:  FLONASE Place 1 spray into both nostrils 2 (two) times daily.   furosemide 40 MG tablet Commonly known as:  LASIX Take 1 tablet (40 mg total) by mouth daily. What changed:  when to take this   glyBURIDE 5 MG tablet Commonly known as:  DIABETA Take 2.5-10 mg by mouth 2 (two) times daily with a meal. 2 tablets in the morning and 1/2 tablet in the evening   HYDROcodone-acetaminophen 5-325 MG tablet Commonly known as:  NORCO/VICODIN Take 1-2 tablets by mouth every 4 (four) hours as needed for moderate pain.   Insulin Glargine 100 UNIT/ML Solostar Pen Commonly known as:  LANTUS Inject 24 Units into the skin daily.   loratadine 10 MG tablet Commonly known as:  CLARITIN Take 10 mg by mouth daily.   metoprolol succinate 50 MG 24 hr tablet Commonly known as:   TOPROL-XL Take 50 mg by mouth 2 (two) times daily. Take with or immediately following a meal.   OVER THE COUNTER MEDICATION Take 1 capsule by mouth 2 (two) times daily. "Higher Thoughts"   polyethylene glycol packet Commonly known as:  MIRALAX / GLYCOLAX Take 17 g by mouth daily.   potassium chloride SA 20 MEQ tablet Commonly known as:  K-DUR,KLOR-CON Take 20 mEq by mouth daily.   senna-docusate 8.6-50 MG tablet Commonly known as:  Senokot-S Take 1 tablet by mouth at bedtime.   simvastatin 20 MG tablet Commonly known as:  ZOCOR Take 20 mg by mouth every evening.   traMADol 50 MG tablet Commonly known as:  ULTRAM Take 50 mg by mouth every evening.      Follow-up Information    Nicholes Stairs, MD. Schedule an appointment as soon as possible for a visit in 2 week(s).   Specialty:  Orthopedic Surgery Contact information: [address] [place] [address] [phone]        [name], MD. Schedule an appointment as soon as possible  for a visit in 1 week(s).   Specialty:  Internal Medicine Contact information: [place] [address] [phone]          Allergies  Allergen Reactions  . Aspirin Anaphylaxis, Shortness Of Breath and Rash    "broke out in white welts; red blotches neck and face; windpipe closing; ~ 1962"    Consultations:  Orthopedic surgery   Procedures/Studies: Ct Shoulder Left Wo Contrast  Result Date: 12/25/2016 CLINICAL DATA:  The patient suffered a proximal left humerus fracture in a fall 12/23/2016. Initial encounter. EXAM: CT OF THE UPPER LEFT EXTREMITY WITHOUT CONTRAST TECHNIQUE: Multidetector CT imaging of the upper left extremity was performed according to the standard protocol. COMPARISON:  Plain films left shoulder 12/23/2016. FINDINGS: Bones/Joint/Cartilage The patient has a comminuted fracture of the proximal left humerus. The fracture is oblique in orientation extending from the anterior  cortex at the level of the surgical neck in a posterior and inferior orientation through the proximal diaphysis of the humerus. There are 4 main fracture fragments. Largest floating fragment posterior cortex measures approximately 2 cm transverse by 4.5 cm craniocaudal. The diaphysis of the humerus demonstrates approximately 1 shaft width anterior displacement and foreshortening of approximately 3 cm. The humeral head is rotated medially due to retraction by the supraspinatus. The humeral head is located. Moderate acromioclavicular osteoarthritis is seen. Type 2 acromion is noted. Ligaments Suboptimally assessed by CT. Muscles and Tendons The rotator cuff appears intact.  No muscle atrophy is identified. Soft tissues Soft tissue edema and contusion are seen about the patient's fracture. IMPRESSION: Comminuted fracture of the proximal humerus is impacted and displaced as described above. The fracture does not involve the greater or lesser tuberosities. Electronically Signed   By: Inge Rise M.D.   On: 12/25/2016 09:39   Dg Shoulder Left  Result Date: 12/23/2016 CLINICAL DATA:  Fall, left shoulder deformity EXAM: LEFT SHOULDER - 2+ VIEW COMPARISON:  None. FINDINGS: Comminuted left humeral neck fracture, with 3 dominant parts. Associated anterior displacement of the humeral shaft. Visualized soft tissues are within normal limits. Visualized left lung is clear. IMPRESSION: Comminuted three-part left humeral neck fracture, as above. Electronically Signed   By: Julian Hy M.D.   On: 12/23/2016 13:30       Subjective: Arm is painful.  Discharge Exam: Vitals:   12/25/16 0411 12/25/16 0745  BP: (!) 134/54   Pulse: 63   Resp: 14   Temp: 98 F (36.7 C)   SpO2: 96% 94%   Vitals:   12/24/16 1430 12/24/16 2251 12/25/16 0411 12/25/16 0745  BP: (!) 103/52 (!) 116/57 (!) 134/54   Pulse: 78 67 63   Resp: 16 16 14    Temp: 98.2 F (36.8 C) 98.1 F (36.7 C) 98 F (36.7 C)   TempSrc: Oral  Oral Oral   SpO2: 97% 95% 96% 94%  Weight:      Height:        General: Pt is alert, awake, not in acute distress Cardiovascular: RRR, S1/S2 +, no rubs, no gallops Respiratory: CTA bilaterally, no wheezing, no rhonchi Abdominal: Soft, NT, ND, bowel sounds + Extremities: no edema, no cyanosis. Left arm in sling. Limited range of motion secondary to pain    The results of significant diagnostics from this hospitalization (including imaging, microbiology, ancillary and laboratory) are listed below for reference.     Microbiology: No results found for this or any previous visit (from the past 240 hour(s)).   Labs: BNP (last 3  results) No results for input(s): BNP in the last 8760 hours. Basic Metabolic Panel:  Recent Labs Lab 12/23/16 1217 12/24/16 0546  NA 138 138  K 3.4* 3.0*  CL 104 105  CO2 26 25  GLUCOSE 257* 174*  BUN 20 19  CREATININE 1.52* 1.50*  CALCIUM 8.6* 8.3*   Liver Function Tests: No results for input(s): AST, ALT, ALKPHOS, BILITOT, PROT, ALBUMIN in the last 168 hours. No results for input(s): LIPASE, AMYLASE in the last 168 hours. No results for input(s): AMMONIA in the last 168 hours. CBC:  Recent Labs Lab 12/23/16 1217 12/24/16 0546 12/24/16 1659  WBC 15.2* 8.2  --   HGB 8.0* 6.5* 8.4*  HCT 25.3* 20.5* 25.4*  MCV 93.0 92.3  --   PLT 104* 98*  --    Cardiac Enzymes: No results for input(s): CKTOTAL, CKMB, CKMBINDEX, TROPONINI in the last 168 hours. BNP: Invalid input(s): POCBNP CBG:  Recent Labs Lab 12/23/16 2020 12/24/16 1232 12/24/16 1703 12/24/16 2322 12/25/16 0810  GLUCAP 250* 184* 221* 184* 155*   D-Dimer No results for input(s): DDIMER in the last 72 hours. Hgb A1c  Recent Labs  12/23/16 1637  HGBA1C 6.2*   Lipid Profile No results for input(s): CHOL, HDL, LDLCALC, TRIG, CHOLHDL, LDLDIRECT in the last 72 hours. Thyroid function studies No results for input(s): TSH, T4TOTAL, T3FREE, THYROIDAB in the last 72  hours.  Invalid input(s): FREET3 Anemia work up No results for input(s): VITAMINB12, FOLATE, FERRITIN, TIBC, IRON, RETICCTPCT in the last 72 hours. Urinalysis    Component Value Date/Time   COLORURINE YELLOW 12/13/2013 1556   APPEARANCEUR CLOUDY (A) 12/13/2013 1556   LABSPEC 1.012 12/13/2013 1556   PHURINE 6.0 12/13/2013 1556   GLUCOSEU NEGATIVE 12/13/2013 1556   HGBUR NEGATIVE 12/13/2013 1556   BILIRUBINUR NEGATIVE 12/13/2013 [place] 12/13/2013 1556   PROTEINUR NEGATIVE 12/13/2013 1556   UROBILINOGEN 1.0 12/13/2013 1556   NITRITE NEGATIVE 12/13/2013 1556   LEUKOCYTESUR NEGATIVE 12/13/2013 1556    Time coordinating discharge: Over 30 minutes  SIGNED:   Cordelia Poche, MD [facility] 12/25/2016, 11:21 AM Pager [phone]  If 7PM-7AM, please contact night-coverage [email or web address] Password [email or web address]

## 2016-12-25 NOTE — Progress Notes (Signed)
Occupational Therapy Treatment Patient Details Name: Ronald [name] MRN: [id number] DOB: 1922-08-10 Today's Date: 12/25/2016    History of present illness Pt is a 83 y.o. male admitted on 12/23/16 post-mechanical fall at home; x-rays showed a L proximal humerus fx. Pt currently considering operative (L reverse TSA) vs. non-operative options. Pertinent PMH includes DME, PVD, HTN, aortic stenosis, chronic a-fib, COPD, depression.     OT comments  Pt progressing towards goals, continues to require MaxA for ADL completion secondary to LUE pain and functional limitations with further education provided on shoulder precautions and compensatory techniques for completing ADLs. Pt continues to require ModA for sit<>stand during session, unable to further progress mobility due to significant pain and safety concerns. Will continue to follow up to provide further family education regarding ADL completion while adhering to shoulder precautions as Pt continues to desire to return home prior to planned surgery this Friday; pt will need 24 hr supervision/assist during this time; will follow after surgery Friday to further assess Pt's d/c recommendations. Feel SNF recommendation remains appropriate at this time.    Follow Up Recommendations  SNF;Supervision/Assistance - 24 hour    Equipment Recommendations  None recommended by OT          Precautions / Restrictions Precautions Precautions: Fall;Shoulder Type of Shoulder Precautions: spoke with ortho PA for clarification on ROM orders, per ortho PA, okay for gentle PROM (pendulums only) to L shoulder  Shoulder Interventions: Shoulder sling/immobilizer;Off for dressing/bathing/exercises Precaution Comments: verbally reviewed shoulder precautions Required Braces or Orthoses: Sling Restrictions Weight Bearing Restrictions: Yes LUE Weight Bearing: Non weight bearing       Mobility Bed Mobility Overal bed mobility: Needs Assistance Bed Mobility:  Supine to Sit     Supine to sit: Mod assist     General bed mobility comments: OOB with PT, in recliner upon arrival   Transfers Overall transfer level: Needs assistance Equipment used: 1 person hand held assist Transfers: Sit to/from Stand Sit to Stand: Mod assist Stand pivot transfers: Mod assist       General transfer comment: assist to rise and steady; Pt with increased pain upon standing, returned to sitting in recliner with further mobility deferred at this time     Balance Overall balance assessment: Needs assistance Sitting-balance support: No upper extremity supported;Feet supported Sitting balance-Leahy Scale: Fair     Standing balance support: Single extremity supported Standing balance-Leahy Scale: Poor Standing balance comment: Reliant on RUE support                            ADL either performed or assessed with clinical judgement   ADL Overall ADL's : Needs assistance/impaired Eating/Feeding: Set up;Sitting Eating/Feeding Details (indicate cue type and reason): assist to open containers; positioning of food tray in front of Pt              Upper Body Dressing : Moderate assistance;Sitting;Maximal assistance;Cueing for safety;Cueing for compensatory techniques;Cueing for UE precautions Upper Body Dressing Details (indicate cue type and reason): Reviewed UB dressing doffing/donning clean gown; Pt able to assist with task completion with ModA and verbal cues for shoulder precautions, technique; MaxA to don sling                 Functional mobility during ADLs: Moderate assistance General ADL Comments: further review/education provided regarding shoulder precautions and compensatory techniques for completing ADLs, Pt continues to present with limitations due to significant pain in LUE with movement;  feel family education will be beneficial prior to Pt's return home to ensure safe ADL and functional mobility completion.                         Cognition Arousal/Alertness: Awake/alert Behavior During Therapy: WFL for tasks assessed/performed Overall Cognitive Status: Within Functional Limits for tasks assessed                                 General Comments: Pt continues to be adamant about d/c home this afternoon and is clearly frustrated about not being able to do this yet despite education on the process for how this works        Exercises Shoulder Exercises Elbow Flexion: AAROM;Left;5 reps;Seated Elbow Extension: AAROM;Left;5 reps;Seated Wrist Flexion: AAROM;10 reps;Left;Seated Wrist Extension: AAROM;10 reps;Left;Seated Digit Composite Flexion: AROM;10 reps;Left;Seated Composite Extension: AROM;10 reps;Left;Seated   Shoulder Instructions Shoulder Instructions Donning/doffing shirt without moving shoulder: Moderate assistance Donning/doffing sling/immobilizer: Maximal assistance Correct positioning of sling/immobilizer: Maximal assistance ROM for elbow, wrist and digits of operated UE: Minimal assistance Sling wearing schedule (on at all times/off for ADL's): Minimal assistance           Pertinent Vitals/ Pain       Pain Assessment: Faces Faces Pain Scale: Hurts worst Pain Location: LUE Pain Descriptors / Indicators: Aching;Grimacing;Guarding Pain Intervention(s): Monitored during session;Repositioned;Ice applied                                                          Frequency  Min 2X/week        Progress Toward Goals  OT Goals(current goals can now be found in the care plan section)  Progress towards OT goals: Progressing toward goals  Acute Rehab OT Goals Patient Stated Goal: Return home ASAP OT Goal Formulation: With patient Time For Goal Achievement: 01/07/17 Potential to Achieve Goals: Good  Plan Discharge plan remains appropriate                     AM-PAC PT "6 Clicks" Daily Activity     Outcome Measure   Help from another  person eating meals?: A Little Help from another person taking care of personal grooming?: A Little Help from another person toileting, which includes using toliet, bedpan, or urinal?: A Lot Help from another person bathing (including washing, rinsing, drying)?: A Lot Help from another person to put on and taking off regular upper body clothing?: A Lot Help from another person to put on and taking off regular lower body clothing?: A Lot 6 Click Score: 14    End of Session Equipment Utilized During Treatment: Gait belt;Other (comment) (sling)  OT Visit Diagnosis: Unsteadiness on feet (R26.81);History of falling (Z91.81);Muscle weakness (generalized) (M62.81);Pain Pain - Right/Left: Left Pain - part of body: Shoulder   Activity Tolerance Patient tolerated treatment well;Patient limited by pain   Patient Left in chair;with call bell/phone within reach;with chair alarm set   Nurse Communication Mobility status        Time: 1884-1660 OT Time Calculation (min): 32 min  Charges: OT General Charges $OT Visit: 1 Visit OT Treatments $Self Care/Home Management : 23-37 mins  Lou Cal, OT Pager [phone] 12/25/2016    Raymondo Band  12/25/2016, 1:23 PM

## 2016-12-25 NOTE — Progress Notes (Signed)
Occupational Therapy Treatment Patient Details Name: Ronald [name] MRN: [id number] DOB: March 03, 1922 Today's Date: 12/25/2016    History of present illness Pt is a 83 y.o. male admitted on 12/23/16 post-mechanical fall at home; x-rays showed a L proximal humerus fx. Pt currently considering operative (L reverse TSA) vs. non-operative options. Pertinent PMH includes DME, PVD, HTN, aortic stenosis, chronic a-fib, COPD, depression.     OT comments  Follow up tx session with focus on family education for increased safety/ADL completion after return home. [name] present and Pt/[name] verbalizing understanding of shoulder precautions, need for Pt's 24 hr supervision/assist at home until planned surgery this Friday, and techniques for completing ADLs while adhering to precautions. Pt completed UB/LB dressing during session with MaxA overall, ModA for sit<>stand. Will continue to follow and assess Pt's progress post-surgery both acutely as well as to determine appropriate d/c recommendations.    Follow Up Recommendations  SNF;Supervision/Assistance - 24 hour    Equipment Recommendations  None recommended by OT          Precautions / Restrictions Precautions Precautions: Fall;Shoulder Type of Shoulder Precautions: spoke with ortho PA for clarification on ROM orders, per ortho PA, okay for gentle PROM (pendulums only) to L shoulder  Shoulder Interventions: Shoulder sling/immobilizer;Off for dressing/bathing/exercises Precaution Comments: verbally reviewed shoulder precautions Required Braces or Orthoses: Sling Restrictions Weight Bearing Restrictions: Yes LUE Weight Bearing: Non weight bearing       Mobility Bed Mobility Overal bed mobility: Needs Assistance Bed Mobility: Supine to Sit     Supine to sit: Mod assist     General bed mobility comments: In recliner upon arrival   Transfers Overall transfer level: Needs assistance Equipment used: 1 person hand held  assist Transfers: Sit to/from Stand Sit to Stand: Mod assist Stand pivot transfers: Mod assist       General transfer comment: assist to rise and steady; increased time and effort; verbal cues for hand placement     Balance Overall balance assessment: Needs assistance Sitting-balance support: No upper extremity supported;Feet supported Sitting balance-Leahy Scale: Fair     Standing balance support: Single extremity supported Standing balance-Leahy Scale: Poor Standing balance comment: Reliant on RUE support                            ADL either performed or assessed with clinical judgement   ADL Overall ADL's : Needs assistance/impaired Eating/Feeding: Set up;Sitting Eating/Feeding Details (indicate cue type and reason): assist to open containers; positioning of food tray in front of Pt              Upper Body Dressing : Moderate assistance;Sitting;Maximal assistance;Cueing for safety;Cueing for compensatory techniques;Cueing for UE precautions Upper Body Dressing Details (indicate cue type and reason): donning overhead shirt/sling  Lower Body Dressing: Sit to/from stand;Maximal assistance Lower Body Dressing Details (indicate cue type and reason): donning underwear/pants             Functional mobility during ADLs: Moderate assistance General ADL Comments: [name] present and providing education to Pt/[name] on shoulder precautions and techniques for completing ADLs with Pt return demonstrating and [name] verbalizing understanding of education                        Cognition Arousal/Alertness: Awake/alert Behavior During Therapy: WFL for tasks assessed/performed Overall Cognitive Status: Within Functional Limits for tasks assessed  General Comments: Pt continues to be adamant about d/c home this afternoon and is clearly frustrated about not being able to do this yet despite education on the  process for how this works        Exercises Shoulder Exercises Elbow Flexion: AAROM;Left;5 reps;Seated Elbow Extension: AAROM;Left;5 reps;Seated Wrist Flexion: AAROM;10 reps;Left;Seated Wrist Extension: AAROM;10 reps;Left;Seated Digit Composite Flexion: AROM;10 reps;Left;Seated Composite Extension: AROM;10 reps;Left;Seated Other Exercises Other Exercises: reviewed ROM to elbow, wrist and hand to tolerance with Pt/[name], verbalized understanding Other Exercises: Educated Pt/[name] on modified pendulums (gentle/passive pendulums) from seated position as Pt with increased pain in standing and ultimately unsteady in standing at this time, not safe to complete in standing; verbalized understanding    Shoulder Instructions Shoulder Instructions Donning/doffing shirt without moving shoulder: Maximal assistance;Caregiver independent with task Method for sponge bathing under operated UE: Moderate assistance;Caregiver independent with task Donning/doffing sling/immobilizer: Maximal assistance;Caregiver independent with task Correct positioning of sling/immobilizer: Maximal assistance;Caregiver independent with task ROM for elbow, wrist and digits of operated UE: Minimal assistance;Caregiver independent with task Sling wearing schedule (on at all times/off for ADL's): Minimal assistance;Caregiver independent with task Proper positioning of operated UE when showering: Minimal assistance;Caregiver independent with task Positioning of UE while sleeping: Moderate assistance;Caregiver independent with task     General Comments [name] present     Pertinent Vitals/ Pain       Pain Assessment: Faces Faces Pain Scale: Hurts whole lot Pain Location: LUE Pain Descriptors / Indicators: Aching;Grimacing;Guarding Pain Intervention(s): Monitored during session;Limited activity within patient's tolerance                                                          Frequency   Min 2X/week        Progress Toward Goals  OT Goals(current goals can now be found in the care plan section)  Progress towards OT goals: Progressing toward goals  Acute Rehab OT Goals Patient Stated Goal: Return home ASAP OT Goal Formulation: With patient Time For Goal Achievement: 01/07/17 Potential to Achieve Goals: Good  Plan Discharge plan remains appropriate                     AM-PAC PT "6 Clicks" Daily Activity     Outcome Measure   Help from another person eating meals?: A Little Help from another person taking care of personal grooming?: A Little Help from another person toileting, which includes using toliet, bedpan, or urinal?: A Lot Help from another person bathing (including washing, rinsing, drying)?: A Lot Help from another person to put on and taking off regular upper body clothing?: A Lot Help from another person to put on and taking off regular lower body clothing?: A Lot 6 Click Score: 14    End of Session Equipment Utilized During Treatment: Other (comment) (sling )  OT Visit Diagnosis: Unsteadiness on feet (R26.81);History of falling (Z91.81);Muscle weakness (generalized) (M62.81);Pain Pain - Right/Left: Left Pain - part of body: Shoulder   Activity Tolerance Patient tolerated treatment well   Patient Left in chair;with call bell/phone within reach;with family/visitor present   Nurse Communication Mobility status        Time: 8338-2505 OT Time Calculation (min): 42 min  Charges: OT General Charges $OT Visit: 1 Visit OT Treatments $Self Care/Home Management : 23-37  mins  Lou Cal, [place] Pager [phone] 12/25/2016    Raymondo Band 12/25/2016, 3:21 PM

## 2016-12-25 NOTE — Progress Notes (Signed)
Physical Therapy Treatment Patient Details Name: Ronald [name] MRN: [id number] DOB: 1922-05-19 Today's Date: 12/25/2016    History of Present Illness Pt is a 83 y.o. male admitted on 12/23/16 post-mechanical fall at home; x-rays showed a L proximal humerus fx. Pt currently considering operative (L reverse TSA) vs. non-operative options. Pertinent PMH includes DME, PVD, HTN, aortic stenosis, chronic a-fib, COPD, depression.     PT Comments    Still unclear if pt is to be d/c home today or remain at hospital until sx on Friday. Pt still adamant that he is to return home today in order to take care of things there prior to his surgery; states he has discussed this with family who will be able to help. No family present at this time to discuss this, as pt will require 24/7 supervision and assist for all transfers and w/c mobility, as pt is at significant risk for falls and requires modA for stand pivot transfer to/from chair. Recommend w/c mobility at home as pt would require significant assist to ambulate secondary to pain, weakness, and decreased balance. Pt will need assist to push w/c due to difficulty with LUE pain and limited ROM. If pt were to remain in hospital through his surgery, will most likely recommend SNF-level follow-up therapies prior to d/c home pending pt progression with mobility. Will continue to follow acutely if pt remains admitted.   Follow Up Recommendations  SNF;Supervision for mobility/OOB     Equipment Recommendations  None recommended by PT    Recommendations for Other Services       Precautions / Restrictions Precautions Precautions: Fall;Shoulder Type of Shoulder Precautions: spoke with ortho PA for clarification on ROM orders, per ortho PA, okay for gentle PROM (pendulums only) to L shoulder  Shoulder Interventions: Shoulder sling/immobilizer;Off for dressing/bathing/exercises Restrictions Weight Bearing Restrictions: Yes LUE Weight Bearing: Non weight  bearing    Mobility  Bed Mobility Overal bed mobility: Needs Assistance Bed Mobility: Supine to Sit     Supine to sit: Mod assist     General bed mobility comments: ModA for RUE support to elevate trunk into sitting  Transfers Overall transfer level: Needs assistance Equipment used: 1 person hand held assist Transfers: Sit to/from [employer] Transfers Sit to Stand: Mod assist Stand pivot transfers: Mod assist       General transfer comment: Pt reliant on modA for RUE support to stand, maintain balance, and pivot to chair. Educ on technique as pt will be reliant on assist from family to transfer to/from w/c   Ambulation/Gait                 Stairs            Wheelchair Mobility    Modified Rankin (Stroke Patients Only)       Balance Overall balance assessment: Needs assistance Sitting-balance support: No upper extremity supported;Feet supported Sitting balance-Leahy Scale: Fair     Standing balance support: Single extremity supported Standing balance-Leahy Scale: Poor Standing balance comment: Reliant on RUE support                             Cognition Arousal/Alertness: Awake/alert Behavior During Therapy: WFL for tasks assessed/performed Overall Cognitive Status: Within Functional Limits for tasks assessed                                 General Comments:  Pt continues to be adamant about d/c home this afternoon and is clearly frustrated about not being able to do this yet despite education on the process for how this works      Exercises      General Comments        Pertinent Vitals/Pain Pain Assessment: Faces Pain Location: LUE Pain Descriptors / Indicators: Aching;Grimacing;Guarding Pain Intervention(s): Monitored during session;Repositioned    Home Living                      Prior Function            PT Goals (current goals can now be found in the care plan section) Acute Rehab PT  Goals Patient Stated Goal: Return home ASAP PT Goal Formulation: With patient Time For Goal Achievement: 01/07/17 Potential to Achieve Goals: Fair Progress towards PT goals: Progressing toward goals    Frequency    Min 3X/week      PT Plan Current plan remains appropriate    Co-evaluation              AM-PAC PT "6 Clicks" Daily Activity  Outcome Measure  Difficulty turning over in bed (including adjusting bedclothes, sheets and blankets)?: Unable Difficulty moving from lying on back to sitting on the side of the bed? : Unable Difficulty sitting down on and standing up from a chair with arms (e.g., wheelchair, bedside commode, etc,.)?: Unable Help needed moving to and from a bed to chair (including a wheelchair)?: A Little Help needed walking in hospital room?: A Little Help needed climbing 3-5 steps with a railing? : A Lot 6 Click Score: 11    End of Session Equipment Utilized During Treatment: Gait belt Activity Tolerance: Patient limited by pain Patient left: in chair;with call bell/phone within reach;with chair alarm set;Other (comment) (with OT) Nurse Communication: Mobility status PT Visit Diagnosis: Other abnormalities of gait and mobility (R26.89);Muscle weakness (generalized) (M62.81);Pain Pain - Right/Left: Left Pain - part of body: Shoulder     Time: 1206-1221 PT Time Calculation (min) (ACUTE ONLY): 15 min  Charges:  $Therapeutic Activity: 8-22 mins                    G Codes:      Mabeline Caras, PT, DPT Acute Rehab Services  Pager: [place] 12/25/2016, 12:31 PM

## 2016-12-25 NOTE — Clinical Social Work Note (Signed)
Clinical Social Work Assessment  Patient Details  Name: Ronald [name] MRN: [id number] Date of Birth: [birth year]  Date of referral:  12/25/16               Reason for consult:  Facility Placement                Permission sought to share information with:  Chartered certified accountant granted to share information::  No  Name::        Agency::     Relationship::     Contact Information:     Housing/Transportation Living arrangements for the past 2 months:  Single Family Home Source of Information:  Patient Patient Interpreter Needed:  None Criminal Activity/Legal Involvement Pertinent to Current Situation/Hospitalization:  No - Comment as needed Significant Relationships:  Adult Children, Other Family Members Lives with:  Self Do you feel safe going back to the place where you live?  Yes Need for family participation in patient care:  No (Coment)  Care giving concerns:  CSW met with patient at bedside and he has declined to go to SNF. Pt adamant that he has things to do of importance today and tomorrow and needs to leave by noon. 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 CSW will defer to [facility] and medical doctor at this time.  Pt confirmed that he resides alone, has three adult children, supportive family, ambulates with walker/cane or motorized scooter daily. Pt alert and oriented to make decisions.  Social Worker assessment / plan:  No CSW needs at this time.   Employment status:  Health visitor PT Recommendations:  [place] / Referral to community resources:  [place]  Patient/Family's Response to care:  Pt appreciative of CSW following up, however patient adamant that he is going home.  Patient/Family's Understanding of and Emotional Response to Diagnosis, Current Treatment, and Prognosis:  Patient has  good understanding of diagnosis, current treatment and prognosis. Pt has personal needs and will need to go home to address. Pt states he will return for surgery. No issues of concerns at this time.  Emotional Assessment Appearance:  Appears stated age Attitude/Demeanor/Rapport:   (cooperative) Affect (typically observed):  Accepting, Appropriate Orientation:  Oriented to Situation, Oriented to  Time, Oriented to Place, Oriented to Self Alcohol / Substance use:  Not Applicable Psych involvement (Current and /or in the community):  No (Comment)  Discharge Needs  Concerns to be addressed:  Care Coordination Readmission within the last 30 days:  No Current discharge risk:  Physical Impairment, Dependent with Mobility Barriers to Discharge:  No Barriers Identified   Normajean Baxter, LCSW 12/25/2016, 12:27 PM

## 2016-12-25 NOTE — Progress Notes (Signed)
PT Cancellation Note  Patient Details Name: Ronald [name] MRN: [id number] DOB: 1922/04/07   Cancelled Treatment:    Reason Eval/Treat Not Completed: Pain limiting ability to participate;Fatigue/lethargy limiting ability to participate. Pt requesting PT come back later as he is too tired to participate this morning and needs some rest. Pt also very concerned that he will not be able to d/c home today in order to take care of his will and other documents at home prior to his sx on Friday (he has not had a chance to discuss this with family). Will plan to follow-up for PT treatment later this afternoon as time allows, and hopefully when family is present.  Mabeline Caras, PT, DPT Acute Rehab Services  Pager: [place] 12/25/2016, 9:35 AM

## 2016-12-26 ENCOUNTER — Inpatient Hospital Stay (HOSPITAL_COMMUNITY)
Admission: RE | Admit: 2016-12-26 | Discharge: 2017-01-03 | DRG: 483 | Disposition: A | Discharge status: Skilled Nursing Facility | Payer: PPO | Source: Ambulatory Visit | Attending: Orthopedic Surgery | Admitting: Orthopedic Surgery

## 2016-12-26 ENCOUNTER — Encounter (HOSPITAL_COMMUNITY): Payer: Self-pay

## 2016-12-26 DIAGNOSIS — S42202A Unspecified fracture of upper end of left humerus, initial encounter for closed fracture: Secondary | ICD-10-CM | POA: Diagnosis present

## 2016-12-26 DIAGNOSIS — F329 Major depressive disorder, single episode, unspecified: Secondary | ICD-10-CM | POA: Diagnosis not present

## 2016-12-26 DIAGNOSIS — J449 Chronic obstructive pulmonary disease, unspecified: Secondary | ICD-10-CM | POA: Diagnosis present

## 2016-12-26 DIAGNOSIS — Z800 Family history of malignant neoplasm of digestive organs: Secondary | ICD-10-CM

## 2016-12-26 DIAGNOSIS — Z79899 Other long term (current) drug therapy: Secondary | ICD-10-CM

## 2016-12-26 DIAGNOSIS — I2510 Atherosclerotic heart disease of native coronary artery without angina pectoris: Secondary | ICD-10-CM | POA: Diagnosis present

## 2016-12-26 DIAGNOSIS — Z961 Presence of intraocular lens: Secondary | ICD-10-CM | POA: Diagnosis present

## 2016-12-26 DIAGNOSIS — R1312 Dysphagia, oropharyngeal phase: Secondary | ICD-10-CM | POA: Diagnosis not present

## 2016-12-26 DIAGNOSIS — Z8711 Personal history of peptic ulcer disease: Secondary | ICD-10-CM | POA: Diagnosis not present

## 2016-12-26 DIAGNOSIS — E785 Hyperlipidemia, unspecified: Secondary | ICD-10-CM | POA: Diagnosis not present

## 2016-12-26 DIAGNOSIS — Z96612 Presence of left artificial shoulder joint: Secondary | ICD-10-CM | POA: Diagnosis not present

## 2016-12-26 DIAGNOSIS — M6281 Muscle weakness (generalized): Secondary | ICD-10-CM | POA: Diagnosis not present

## 2016-12-26 DIAGNOSIS — R262 Difficulty in walking, not elsewhere classified: Secondary | ICD-10-CM | POA: Diagnosis not present

## 2016-12-26 DIAGNOSIS — D72829 Elevated white blood cell count, unspecified: Secondary | ICD-10-CM | POA: Diagnosis not present

## 2016-12-26 DIAGNOSIS — D638 Anemia in other chronic diseases classified elsewhere: Secondary | ICD-10-CM | POA: Diagnosis present

## 2016-12-26 DIAGNOSIS — N179 Acute kidney failure, unspecified: Secondary | ICD-10-CM | POA: Diagnosis not present

## 2016-12-26 DIAGNOSIS — Z79891 Long term (current) use of opiate analgesic: Secondary | ICD-10-CM

## 2016-12-26 DIAGNOSIS — I5032 Chronic diastolic (congestive) heart failure: Secondary | ICD-10-CM | POA: Diagnosis present

## 2016-12-26 DIAGNOSIS — Z7901 Long term (current) use of anticoagulants: Secondary | ICD-10-CM | POA: Diagnosis not present

## 2016-12-26 DIAGNOSIS — N184 Chronic kidney disease, stage 4 (severe): Secondary | ICD-10-CM | POA: Diagnosis present

## 2016-12-26 DIAGNOSIS — D649 Anemia, unspecified: Secondary | ICD-10-CM | POA: Diagnosis present

## 2016-12-26 DIAGNOSIS — S42202D Unspecified fracture of upper end of left humerus, subsequent encounter for fracture with routine healing: Secondary | ICD-10-CM | POA: Diagnosis not present

## 2016-12-26 DIAGNOSIS — I482 Chronic atrial fibrillation, unspecified: Secondary | ICD-10-CM | POA: Diagnosis present

## 2016-12-26 DIAGNOSIS — E118 Type 2 diabetes mellitus with unspecified complications: Secondary | ICD-10-CM | POA: Diagnosis present

## 2016-12-26 DIAGNOSIS — J439 Emphysema, unspecified: Secondary | ICD-10-CM | POA: Diagnosis not present

## 2016-12-26 DIAGNOSIS — R791 Abnormal coagulation profile: Secondary | ICD-10-CM | POA: Diagnosis present

## 2016-12-26 DIAGNOSIS — I130 Hypertensive heart and chronic kidney disease with heart failure and stage 1 through stage 4 chronic kidney disease, or unspecified chronic kidney disease: Secondary | ICD-10-CM | POA: Diagnosis not present

## 2016-12-26 DIAGNOSIS — D62 Acute posthemorrhagic anemia: Secondary | ICD-10-CM | POA: Diagnosis not present

## 2016-12-26 DIAGNOSIS — Z9841 Cataract extraction status, right eye: Secondary | ICD-10-CM

## 2016-12-26 DIAGNOSIS — Z8551 Personal history of malignant neoplasm of bladder: Secondary | ICD-10-CM

## 2016-12-26 DIAGNOSIS — Z953 Presence of xenogenic heart valve: Secondary | ICD-10-CM

## 2016-12-26 DIAGNOSIS — F17210 Nicotine dependence, cigarettes, uncomplicated: Secondary | ICD-10-CM | POA: Diagnosis present

## 2016-12-26 DIAGNOSIS — S4990XA Unspecified injury of shoulder and upper arm, unspecified arm, initial encounter: Secondary | ICD-10-CM | POA: Diagnosis not present

## 2016-12-26 DIAGNOSIS — S42309A Unspecified fracture of shaft of humerus, unspecified arm, initial encounter for closed fracture: Secondary | ICD-10-CM | POA: Diagnosis not present

## 2016-12-26 DIAGNOSIS — I10 Essential (primary) hypertension: Secondary | ICD-10-CM | POA: Diagnosis present

## 2016-12-26 DIAGNOSIS — I459 Conduction disorder, unspecified: Secondary | ICD-10-CM | POA: Diagnosis present

## 2016-12-26 DIAGNOSIS — Z95820 Peripheral vascular angioplasty status with implants and grafts: Secondary | ICD-10-CM

## 2016-12-26 DIAGNOSIS — Z8249 Family history of ischemic heart disease and other diseases of the circulatory system: Secondary | ICD-10-CM

## 2016-12-26 DIAGNOSIS — E1151 Type 2 diabetes mellitus with diabetic peripheral angiopathy without gangrene: Secondary | ICD-10-CM | POA: Diagnosis not present

## 2016-12-26 DIAGNOSIS — I700 Atherosclerosis of aorta: Secondary | ICD-10-CM | POA: Diagnosis present

## 2016-12-26 DIAGNOSIS — R41841 Cognitive communication deficit: Secondary | ICD-10-CM | POA: Diagnosis not present

## 2016-12-26 DIAGNOSIS — Z9049 Acquired absence of other specified parts of digestive tract: Secondary | ICD-10-CM

## 2016-12-26 DIAGNOSIS — G8918 Other acute postprocedural pain: Secondary | ICD-10-CM | POA: Diagnosis not present

## 2016-12-26 DIAGNOSIS — E1122 Type 2 diabetes mellitus with diabetic chronic kidney disease: Secondary | ICD-10-CM | POA: Diagnosis not present

## 2016-12-26 DIAGNOSIS — I708 Atherosclerosis of other arteries: Secondary | ICD-10-CM | POA: Diagnosis present

## 2016-12-26 DIAGNOSIS — S42212A Unspecified displaced fracture of surgical neck of left humerus, initial encounter for closed fracture: Secondary | ICD-10-CM | POA: Diagnosis not present

## 2016-12-26 DIAGNOSIS — I5033 Acute on chronic diastolic (congestive) heart failure: Secondary | ICD-10-CM | POA: Diagnosis present

## 2016-12-26 DIAGNOSIS — D696 Thrombocytopenia, unspecified: Secondary | ICD-10-CM | POA: Diagnosis present

## 2016-12-26 DIAGNOSIS — Z9842 Cataract extraction status, left eye: Secondary | ICD-10-CM

## 2016-12-26 DIAGNOSIS — D6959 Other secondary thrombocytopenia: Secondary | ICD-10-CM | POA: Diagnosis present

## 2016-12-26 DIAGNOSIS — E876 Hypokalemia: Secondary | ICD-10-CM | POA: Diagnosis not present

## 2016-12-26 DIAGNOSIS — R5082 Postprocedural fever: Secondary | ICD-10-CM | POA: Diagnosis not present

## 2016-12-26 DIAGNOSIS — E1140 Type 2 diabetes mellitus with diabetic neuropathy, unspecified: Secondary | ICD-10-CM | POA: Diagnosis present

## 2016-12-26 DIAGNOSIS — S40012A Contusion of left shoulder, initial encounter: Secondary | ICD-10-CM | POA: Diagnosis not present

## 2016-12-26 DIAGNOSIS — W010XXA Fall on same level from slipping, tripping and stumbling without subsequent striking against object, initial encounter: Secondary | ICD-10-CM | POA: Diagnosis not present

## 2016-12-26 DIAGNOSIS — S42292A Other displaced fracture of upper end of left humerus, initial encounter for closed fracture: Secondary | ICD-10-CM | POA: Diagnosis not present

## 2016-12-26 DIAGNOSIS — R2681 Unsteadiness on feet: Secondary | ICD-10-CM | POA: Diagnosis not present

## 2016-12-26 DIAGNOSIS — Z471 Aftercare following joint replacement surgery: Secondary | ICD-10-CM | POA: Diagnosis not present

## 2016-12-26 DIAGNOSIS — Z741 Need for assistance with personal care: Secondary | ICD-10-CM | POA: Diagnosis not present

## 2016-12-26 DIAGNOSIS — Z823 Family history of stroke: Secondary | ICD-10-CM

## 2016-12-26 DIAGNOSIS — Z886 Allergy status to analgesic agent status: Secondary | ICD-10-CM

## 2016-12-26 DIAGNOSIS — Z9181 History of falling: Secondary | ICD-10-CM | POA: Diagnosis not present

## 2016-12-26 DIAGNOSIS — Z794 Long term (current) use of insulin: Secondary | ICD-10-CM

## 2016-12-26 LAB — CBC
HCT: 21.5 % — ABNORMAL LOW (ref 39.0–52.0)
HEMOGLOBIN: 7.1 g/dL — AB (ref 13.0–17.0)
MCH: 30.9 pg (ref 26.0–34.0)
MCHC: 33 g/dL (ref 30.0–36.0)
MCV: 93.5 fL (ref 78.0–100.0)
PLATELETS: 109 10*3/uL — AB (ref 150–400)
RBC: 2.3 MIL/uL — AB (ref 4.22–5.81)
RDW: 20.4 % — ABNORMAL HIGH (ref 11.5–15.5)
WBC: 12.1 10*3/uL — AB (ref 4.0–10.5)

## 2016-12-26 LAB — COMPREHENSIVE METABOLIC PANEL
ALBUMIN: 2.7 g/dL — AB (ref 3.5–5.0)
ALK PHOS: 61 U/L (ref 38–126)
ALT: 19 U/L (ref 17–63)
ANION GAP: 8 (ref 5–15)
AST: 22 U/L (ref 15–41)
BILIRUBIN TOTAL: 1.3 mg/dL — AB (ref 0.3–1.2)
BUN: 29 mg/dL — AB (ref 6–20)
CALCIUM: 8.6 mg/dL — AB (ref 8.9–10.3)
CO2: 29 mmol/L (ref 22–32)
CREATININE: 1.79 mg/dL — AB (ref 0.61–1.24)
Chloride: 101 mmol/L (ref 101–111)
GFR calc Af Amer: 36 mL/min — ABNORMAL LOW (ref >60)
GFR calc non Af Amer: 31 mL/min — ABNORMAL LOW (ref >60)
GLUCOSE: 137 mg/dL — AB (ref 65–99)
Potassium: 2.8 mmol/L — ABNORMAL LOW (ref 3.5–5.1)
SODIUM: 138 mmol/L (ref 135–145)
TOTAL PROTEIN: 5.4 g/dL — AB (ref 6.5–8.1)

## 2016-12-26 LAB — PROTIME-INR
INR: 1.5
Prothrombin Time: 18 seconds — ABNORMAL HIGH (ref 11.4–15.2)

## 2016-12-26 LAB — GLUCOSE, CAPILLARY: Glucose-Capillary: 123 mg/dL — ABNORMAL HIGH (ref 65–99)

## 2016-12-26 MED ORDER — DOXAZOSIN MESYLATE 4 MG PO TABS
4.0000 mg | ORAL_TABLET | Freq: Every evening | ORAL | Status: DC
  Administered 2016-12-26 – 2017-01-02 (×6): 4 mg via ORAL
  Filled 2016-12-26 (×9): qty 1

## 2016-12-26 MED ORDER — BUPROPION HCL ER (XL) 150 MG PO TB24
150.0000 mg | ORAL_TABLET | Freq: Every day | ORAL | Status: DC
  Administered 2016-12-27 – 2017-01-03 (×7): 150 mg via ORAL
  Filled 2016-12-26 (×7): qty 1

## 2016-12-26 MED ORDER — CHLORHEXIDINE GLUCONATE 4 % EX LIQD: 60.0000 mL | Freq: Once | CUTANEOUS | Status: DC

## 2016-12-26 MED ORDER — UMECLIDINIUM-VILANTEROL 62.5-25 MCG/INH IN AEPB
1.0000 | INHALATION_SPRAY | Freq: Every day | RESPIRATORY_TRACT | Status: DC
  Administered 2016-12-27 – 2017-01-03 (×7): 1 via RESPIRATORY_TRACT
  Filled 2016-12-26 (×3): qty 14

## 2016-12-26 MED ORDER — FERROUS SULFATE 325 (65 FE) MG PO TABS
325.0000 mg | ORAL_TABLET | Freq: Two times a day (BID) | ORAL | Status: DC
  Administered 2016-12-26 – 2017-01-03 (×16): 325 mg via ORAL
  Filled 2016-12-26 (×16): qty 1

## 2016-12-26 MED ORDER — METHOCARBAMOL 1000 MG/10ML IJ SOLN
500.0000 mg | Freq: Four times a day (QID) | INTRAMUSCULAR | Status: DC | PRN
  Filled 2016-12-26: qty 5

## 2016-12-26 MED ORDER — FLUTICASONE PROPIONATE 50 MCG/ACT NA SUSP
1.0000 | Freq: Two times a day (BID) | NASAL | Status: DC
  Administered 2016-12-26 – 2017-01-03 (×13): 1 via NASAL
  Filled 2016-12-26: qty 16

## 2016-12-26 MED ORDER — INSULIN ASPART 100 UNIT/ML ~~LOC~~ SOLN
0.0000 [IU] | SUBCUTANEOUS | Status: DC
  Administered 2016-12-26: 1 [IU] via SUBCUTANEOUS
  Administered 2016-12-27 (×3): 3 [IU] via SUBCUTANEOUS
  Administered 2016-12-28 (×2): 5 [IU] via SUBCUTANEOUS
  Administered 2016-12-28: 3 [IU] via SUBCUTANEOUS
  Administered 2016-12-28 – 2016-12-29 (×2): 7 [IU] via SUBCUTANEOUS
  Administered 2016-12-29: 2 [IU] via SUBCUTANEOUS
  Administered 2016-12-29: 3 [IU] via SUBCUTANEOUS
  Administered 2016-12-29: 5 [IU] via SUBCUTANEOUS
  Administered 2016-12-29: 2 [IU] via SUBCUTANEOUS
  Administered 2016-12-30: 5 [IU] via SUBCUTANEOUS
  Administered 2016-12-30 (×2): 1 [IU] via SUBCUTANEOUS
  Administered 2016-12-30 (×3): 5 [IU] via SUBCUTANEOUS
  Administered 2016-12-31: 3 [IU] via SUBCUTANEOUS
  Administered 2016-12-31 (×2): 2 [IU] via SUBCUTANEOUS
  Administered 2017-01-01: 3 [IU] via SUBCUTANEOUS
  Administered 2017-01-01: 5 [IU] via SUBCUTANEOUS
  Administered 2017-01-01: 9 [IU] via SUBCUTANEOUS
  Administered 2017-01-01: 2 [IU] via SUBCUTANEOUS
  Administered 2017-01-01: 1 [IU] via SUBCUTANEOUS
  Administered 2017-01-01: 7 [IU] via SUBCUTANEOUS
  Administered 2017-01-02: 3 [IU] via SUBCUTANEOUS
  Administered 2017-01-02: 2 [IU] via SUBCUTANEOUS
  Administered 2017-01-02: 7 [IU] via SUBCUTANEOUS
  Administered 2017-01-02 (×2): 3 [IU] via SUBCUTANEOUS
  Administered 2017-01-02: 2 [IU] via SUBCUTANEOUS
  Administered 2017-01-03: 3 [IU] via SUBCUTANEOUS
  Administered 2017-01-03: 1 [IU] via SUBCUTANEOUS
  Administered 2017-01-03: 2 [IU] via SUBCUTANEOUS
  Administered 2017-01-03 (×2): 3 [IU] via SUBCUTANEOUS

## 2016-12-26 MED ORDER — ONDANSETRON HCL 4 MG PO TABS: 4.0000 mg | ORAL_TABLET | Freq: Four times a day (QID) | ORAL | Status: DC | PRN

## 2016-12-26 MED ORDER — ACETAMINOPHEN 650 MG RE SUPP: 650.0000 mg | Freq: Four times a day (QID) | RECTAL | Status: DC | PRN

## 2016-12-26 MED ORDER — METHOCARBAMOL 500 MG PO TABS
500.0000 mg | ORAL_TABLET | Freq: Four times a day (QID) | ORAL | Status: DC | PRN
  Administered 2016-12-30 – 2017-01-03 (×7): 500 mg via ORAL
  Filled 2016-12-26 (×7): qty 1

## 2016-12-26 MED ORDER — SENNOSIDES-DOCUSATE SODIUM 8.6-50 MG PO TABS
1.0000 | ORAL_TABLET | Freq: Every day | ORAL | Status: DC
  Administered 2016-12-26 – 2017-01-02 (×8): 1 via ORAL
  Filled 2016-12-26 (×8): qty 1

## 2016-12-26 MED ORDER — CHLORHEXIDINE GLUCONATE 4 % EX LIQD
60.0000 mL | Freq: Once | CUTANEOUS | Status: DC
  Administered 2016-12-26: 4 via TOPICAL

## 2016-12-26 MED ORDER — MORPHINE SULFATE (PF) 4 MG/ML IV SOLN
1.0000 mg | INTRAVENOUS | Status: DC | PRN
  Administered 2016-12-31 – 2017-01-02 (×6): 1 mg via INTRAVENOUS
  Filled 2016-12-26 (×6): qty 1

## 2016-12-26 MED ORDER — ONDANSETRON HCL 4 MG/2ML IJ SOLN: 4.0000 mg | Freq: Four times a day (QID) | INTRAMUSCULAR | Status: DC | PRN

## 2016-12-26 MED ORDER — POTASSIUM CHLORIDE CRYS ER 20 MEQ PO TBCR
20.0000 meq | EXTENDED_RELEASE_TABLET | Freq: Every day | ORAL | Status: DC
  Administered 2016-12-27 – 2017-01-03 (×7): 20 meq via ORAL
  Filled 2016-12-26 (×8): qty 1

## 2016-12-26 MED ORDER — METOPROLOL SUCCINATE ER 50 MG PO TB24
50.0000 mg | ORAL_TABLET | Freq: Two times a day (BID) | ORAL | Status: DC
  Administered 2016-12-26 – 2017-01-03 (×16): 50 mg via ORAL
  Filled 2016-12-26 (×16): qty 1

## 2016-12-26 MED ORDER — ACETAMINOPHEN 325 MG PO TABS
650.0000 mg | ORAL_TABLET | Freq: Four times a day (QID) | ORAL | Status: DC | PRN
  Administered 2016-12-28 – 2016-12-29 (×2): 650 mg via ORAL
  Filled 2016-12-26 (×2): qty 2

## 2016-12-26 MED ORDER — FINASTERIDE 5 MG PO TABS
5.0000 mg | ORAL_TABLET | Freq: Every day | ORAL | Status: DC
  Administered 2016-12-27 – 2017-01-03 (×8): 5 mg via ORAL
  Filled 2016-12-26 (×8): qty 1

## 2016-12-26 MED ORDER — INSULIN GLARGINE 100 UNIT/ML ~~LOC~~ SOLN
12.0000 [IU] | Freq: Every day | SUBCUTANEOUS | Status: DC
  Administered 2016-12-28 – 2017-01-03 (×6): 12 [IU] via SUBCUTANEOUS
  Filled 2016-12-26 (×8): qty 0.12

## 2016-12-26 MED ORDER — HYDROCODONE-ACETAMINOPHEN 5-325 MG PO TABS
1.0000 | ORAL_TABLET | ORAL | Status: DC | PRN
  Administered 2016-12-27: 2 via ORAL
  Administered 2016-12-28 – 2016-12-31 (×11): 1 via ORAL
  Administered 2017-01-01 (×2): 2 via ORAL
  Administered 2017-01-01: 1 via ORAL
  Administered 2017-01-01: 2 via ORAL
  Administered 2017-01-01 – 2017-01-02 (×4): 1 via ORAL
  Administered 2017-01-03: 2 via ORAL
  Administered 2017-01-03: 1 via ORAL
  Filled 2016-12-26 (×5): qty 1
  Filled 2016-12-26: qty 2
  Filled 2016-12-26 (×3): qty 1
  Filled 2016-12-26 (×2): qty 2
  Filled 2016-12-26: qty 1
  Filled 2016-12-26 (×3): qty 2
  Filled 2016-12-26 (×3): qty 1
  Filled 2016-12-26: qty 2
  Filled 2016-12-26 (×4): qty 1

## 2016-12-26 MED ORDER — FUROSEMIDE 40 MG PO TABS
40.0000 mg | ORAL_TABLET | Freq: Two times a day (BID) | ORAL | Status: DC
  Administered 2016-12-27 – 2017-01-03 (×13): 40 mg via ORAL
  Filled 2016-12-26 (×14): qty 1

## 2016-12-26 MED ORDER — CEFAZOLIN SODIUM-DEXTROSE 2-4 GM/100ML-% IV SOLN
2.0000 g | INTRAVENOUS | Status: AC
  Filled 2016-12-26: qty 100

## 2016-12-26 MED ORDER — SIMVASTATIN 20 MG PO TABS
20.0000 mg | ORAL_TABLET | Freq: Every evening | ORAL | Status: DC
  Administered 2016-12-26 – 2017-01-02 (×7): 20 mg via ORAL
  Filled 2016-12-26 (×8): qty 1

## 2016-12-26 MED ORDER — LORATADINE 10 MG PO TABS
10.0000 mg | ORAL_TABLET | Freq: Every day | ORAL | Status: DC
  Administered 2016-12-27 – 2017-01-03 (×7): 10 mg via ORAL
  Filled 2016-12-26 (×7): qty 1

## 2016-12-26 NOTE — Progress Notes (Signed)
Anesthesia Chart Review:  Pt is a same day work up  Pt is a 83 year old male scheduled for reverse L shoulder arthroplasty on 12/27/2016 with Nicholes Stairs, MD  - PCP is [name], MD - Cardiologist is [name], MD. Last office visit 10/18/15 - PV cardiologist is Kathlyn Sacramento, MD  PMH includes: aortic stenosis (s/p AVR 01/07/11), chornic diastolic HF, chronic atrial fibrillation, PVD (s/p R SFA angioplasty and stent 06/17/11), HTN, COPD, anemia, thrombocytopenia, bladder cancer. Current smoker. BMI 25.5. S/p IM nail L hip 12/14/13 and R hip 06/27/11.   - Hospitalized 10/29-31/18 for mechanical fall resulting in L humeral fracture.   Medications include: doxazosin, iron, Lasix, glyburide, Lantus, metoprolol, potassium, simvastatin, anoro ellipta, coumadin.  Coumadin held since 12/23/16.   Labs from hospitalization reviewed.   - BMET 12/24/16 acceptable for surgery.  -  H/H 8.4/25.4 after PRBCs on 12/24/16 - [place] 98 on 12/24/16 - Will recheck CBC and PT day of surgery.   EKG 07/02/16: afib. LAD. Non-specific intra-ventricular conduction block  Aorta duplex 03/21/15:  - infrarenal fusiform dilation in the mid aorta measuring 2.3 x 2.7 cm - Normal caliber common and external iliac arteries, bilaterally - aorto-iliac atherosclerosis - >50% L external iliac stenosis - patent IVC  Echo 02/21/15:  - Left ventricle: Systolic function was normal. The estimated ejection fraction was in the range of 60% to 65%. Wall motion was normal; there were no regional wall motion abnormalities.  - Aortic valve: A bioprosthesis was well-seated with normal leaflet motion and normal transvalvular gradients There was mild to moderate regurgitation directed eccentrically in the LVOT and towards the mitral anterior leaflet. Suspect the regurgitation is intra-annular, but images are not adequate to exclude perivavlvular regurgitation. Valve area (VTI): 1.59 cm^2. Valve area (Vmax): 1.49 cm^2. Valve  area (Vmean): 1.53 cm^2. - Mitral valve: Calcified annulus. Mildly thickened leaflets . - Left atrium: The atrium was severely dilated. - Right atrium: The atrium was severely dilated. - Tricuspid valve: There was mild-moderate regurgitation directed centrally.  Carotid duplex 08/02/13:  - R bulb/proximal ICA: 50-69% diameter reduction - L bulb/proximal ICA: 0-49% diameter reduction  Cardiac cath 11/19/10:  1. Normal coronary arteries 2. 75% L renal artery in-stent restenosis  Pt will need further assessment by assigned anesthesiologist day of surgery.   Willeen Cass, FNP-BC [facility]/Anesthesiology Phone: [phone] 12/26/2016 2:45 PM

## 2016-12-26 NOTE — Progress Notes (Signed)
I was called by my administrative staff on Ronald [name]. Patient was admitted with left proximal humerus fracture with plan on surgical repair on 11/2. However he left AMA on 10/31 with plans on returning for surgery on 11/2. Daughter called our office informing that patient has persistent pain and she will also have difficulty bringing him to the hospital early in the morning for surgery. I have instructed to call patient's daughter to bring him to the ED today and get admitted through there ( we do not have any beds opened up right now for direct admission). Once patient gets admitted we will inform [facility] orthopedics (Dr. Stann Mainland) .

## 2016-12-26 NOTE — H&P (Signed)
ORTHOPAEDIC H and P  REQUESTING PHYSICIAN: Nicholes Stairs, MD  PCP:  [name], MD  Chief Complaint: Left proximal humerus fracture  HPI: Ronald [name] is a 83 y.o. male who complains of Pain and swelling of the left shoulder following a fall on October 9.  He was in his normal state of health preceding the fall and was attending to put on and over and he lost his balance and fell.  At baseline he lives alone and is independent with all ADLs.  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  He does utilize a Catering manager at his home and work to help with mobilization.  He does have multiple medical problems including being on chronic Coumadin for atrial fibrillation.  Following his fall he was noted to have an elevated INR but in the therapeutic range, and this did lead to quite a substantial hematoma about the left shoulder.  He elected to leave the hospital against ideal recommendation so that he could attend to some personal matters.  He returns today for surgical management of the left proximal humerus tomorrow with myself.  He denies any new complaints today or associated symptoms other than pain in the left arm.  Past Medical History:  Diagnosis Date  . AS (aortic stenosis)    bovine aortic valve replacement 01/07/11  . Bladder cancer [facility])    Bladder Cancer local  . Blood transfusion    w/hip operation  . Cellulitis of left lower extremity   . Chronic atrial fibrillation ([place])   . Chronic diastolic congestive heart failure ([place])   . Claudication in peripheral vascular disease ([place]) 06/17/2011  . COPD (chronic obstructive pulmonary disease) ([place])   . Depression wife died 4 years ago.    Marland Kitchen History of stomach ulcers ~ 1951  . HTN (hypertension)   . Neuropathy due to secondary diabetes ([place])   . Normal coronary arteries Sept 2012  . Peripheral vascular disease ([place]) very poor circulation legs and feet ... stents right and left  legs... done in dr j. Gwenlyn Found 's office.   . Pneumonia 07/25/11   left  . Recurrent upper respiratory infection (URI)    sinusitis  . Renal artery stenosis ([place]) 2006   renal artery stent  . S/P angioplasty with stent, diamond back rotational athrectomy Prox. Rt. SFA 06/17/2011 06/17/2011  . Shortness of breath   . Thrombocytopenia due to drugs    seen by Dr Inda Merlin plts [id number] no rx  . Type II diabetes mellitus ([name])    Past Surgical History:  Procedure Laterality Date  . ABDOMINAL ANGIOGRAM  06/17/2011   Procedure: ABDOMINAL ANGIOGRAM;  Surgeon: Lorretta Harp, MD;  Location[facility] C[facility] LAB;  Service: Cardiovascular;;  . AORTIC VALVE REPLACEMENT  01/07/2011   Procedure: AORTIC VALVE REPLACEMENT (AVR);  Surgeon: Grace Isaac, MD;  Location: [place];  Service: Open Heart Surgery;  Laterality: N/A;; magna-ease bovine 13mm bioprosthesis  . ATHERECTOMY N/A 06/17/2011   Procedure: ATHERECTOMY;  Surgeon: Lorretta Harp, MD;  Location: [facility] CATH LAB;  Service: Cardiovascular;  Laterality: N/A;  . CARDIAC CATHETERIZATION  11/19/10   normal coronaries, mod AS, 75% l RAS  . CARDIOVERSION  04/03/2011   Procedure: CARDIOVERSION;  Surgeon: Leonie Man, MD;  Location: [place];  Service: Cardiovascular;  Laterality: N/A;  . CATARACT EXTRACTION W/ INTRAOCULAR LENS  IMPLANT, BILATERAL  ~ 2007  . CHOLECYSTECTOMY    . FEMUR  IM NAIL  06/27/2011   Procedure: INTRAMEDULLARY (IM) NAIL FEMORAL;  Surgeon: Marin Shutter, MD;  Location: [facility];  Service: Orthopedics;  Laterality: Right;  . FEMUR IM NAIL Left 12/14/2013   Procedure: INTRAMEDULLARY (IM) NAIL FEMORAL;  Surgeon: Mauri Pole, MD;  Location: [facility];  Service: Orthopedics;  Laterality: Left;  . LOWER EXTREMITY ANGIOGRAM  06/17/2011   diamondback orbital rotational and cutting balloon atherectomy of the prox R SFA  . LOWER EXTREMITY ANGIOGRAM Bilateral 06/17/2011   Procedure: LOWER EXTREMITY ANGIOGRAM;  Surgeon: Lorretta Harp, MD;  Location: [facility] CATH  LAB;  Service: Cardiovascular;  Laterality: Bilateral;  . PERIPHERAL ARTERIAL STENT GRAFT     2006 left anf right illiac stents Dr Deon Pilling  . RENAL ARTERY STENT  2006   "I believe"  . TONSILLECTOMY AND ADENOIDECTOMY     "when I was a kid"   Social History   Social History  . Marital status: Widowed    Spouse name: N/A  . Number of children: 3  . Years of education: N/A   Occupational History  . sales    Social History Main Topics  . Smoking status: Current Every Day Smoker    Packs/day: 1.50    Years: 75.00    Types: Cigarettes  . Smokeless tobacco: Never Used  . Alcohol use No  . Drug use: No  . Sexual activity: No   Other Topics Concern  . Not on file   Social History Narrative  . No narrative on file   Family History  Problem Relation Age of Onset  . Heart disease Mother   . Cancer Brother        colon  . Stroke Father    Allergies  Allergen Reactions  . Aspirin Anaphylaxis, Shortness Of Breath and Rash    "broke out in white welts; red blotches neck and face; windpipe closing; ~ 1962"   Prior to Admission medications   Medication Sig Start Date End Date Taking? Authorizing Provider  buPROPion (WELLBUTRIN XL) 150 MG 24 hr tablet Take 150 mg by mouth daily.    [provider]  doxazosin (CARDURA) 4 MG tablet Take 4 mg by mouth every evening.     Collins, Gina L, PA-C  ferrous sulfate 325 (65 FE) MG EC tablet Take 1 tablet (325 mg total) by mouth 2 (two) times daily. 07/04/16 07/04/17  Lavina Hamman, MD  finasteride (PROSCAR) 5 MG tablet Take 5 mg by mouth daily.     [provider]  fluticasone (FLONASE) 50 MCG/ACT nasal spray Place 1 spray into both nostrils 2 (two) times daily.     [provider]  furosemide (LASIX) 40 MG tablet Take 1 tablet (40 mg total) by mouth daily. Patient taking differently: Take 40 mg by mouth 2 (two) times daily.  07/04/16   Lavina Hamman, MD  glyBURIDE (DIABETA) 5 MG tablet Take 2.5-10 mg by mouth 2  (two) times daily with a meal. 2 tablets in the morning and 1/2 tablet in the evening    [provider]  HYDROcodone-acetaminophen (NORCO/VICODIN) 5-325 MG tablet Take 1-2 tablets by mouth every 4 (four) hours as needed for moderate pain. 12/25/16   Mariel Aloe, MD  Insulin Glargine (LANTUS) 100 UNIT/ML Solostar Pen Inject 24 Units into the skin daily.     [provider]  loratadine (CLARITIN) 10 MG tablet Take 10 mg by mouth daily.    [provider]  metoprolol succinate (TOPROL-XL) 50  MG 24 hr tablet Take 50 mg by mouth 2 (two) times daily. Take with or immediately following a meal.    [provider]  OVER THE COUNTER MEDICATION Take 1 capsule by mouth 2 (two) times daily. "Higher Thoughts"    [provider]  polyethylene glycol (MIRALAX / GLYCOLAX) packet Take 17 g by mouth daily. 12/25/16   Mariel Aloe, MD  potassium chloride SA (K-DUR,KLOR-CON) 20 MEQ tablet Take 20 mEq by mouth daily.    [provider]  senna-docusate (SENOKOT-S) 8.6-50 MG tablet Take 1 tablet by mouth at bedtime. 12/25/16   Mariel Aloe, MD  simvastatin (ZOCOR) 20 MG tablet Take 20 mg by mouth every evening.    [provider]  traMADol (ULTRAM) 50 MG tablet Take 50 mg by mouth every evening.     [provider]  Umeclidinium-Vilanterol (ANORO ELLIPTA) 62.5-25 MCG/INH AEPB Inhale 1 puff into the lungs daily.    [provider]   Ct Shoulder Left Wo Contrast  Result Date: 12/25/2016 CLINICAL DATA:  The patient suffered a proximal left humerus fracture in a fall 12/23/2016. Initial encounter. EXAM: CT OF THE UPPER LEFT EXTREMITY WITHOUT CONTRAST TECHNIQUE: Multidetector CT imaging of the upper left extremity was performed according to the standard protocol. COMPARISON:  Plain films left shoulder 12/23/2016. FINDINGS: Bones/Joint/Cartilage The patient has a comminuted fracture of the proximal left humerus. The fracture is oblique in  orientation extending from the anterior cortex at the level of the surgical neck in a posterior and inferior orientation through the proximal diaphysis of the humerus. There are 4 main fracture fragments. Largest floating fragment posterior cortex measures approximately 2 cm transverse by 4.5 cm craniocaudal. The diaphysis of the humerus demonstrates approximately 1 shaft width anterior displacement and foreshortening of approximately 3 cm. The humeral head is rotated medially due to retraction by the supraspinatus. The humeral head is located. Moderate acromioclavicular osteoarthritis is seen. Type 2 acromion is noted. Ligaments Suboptimally assessed by CT. Muscles and Tendons The rotator cuff appears intact.  No muscle atrophy is identified. Soft tissues Soft tissue edema and contusion are seen about the patient's fracture. IMPRESSION: Comminuted fracture of the proximal humerus is impacted and displaced as described above. The fracture does not involve the greater or lesser tuberosities. Electronically Signed   By: Inge Rise M.D.   On: 12/25/2016 09:39    Positive ROS: All other systems have been reviewed and were otherwise negative with the exception of those mentioned in the HPI and as above.  Physical Exam: General: Alert, no acute distress Cardiovascular: No pedal edema Respiratory: No cyanosis, no use of accessory musculature GI: No organomegaly, abdomen is soft and non-tender Skin: No lesions in the area of chief complaint Neurologic: Sensation intact distally Psychiatric: Patient is competent for consent with normal mood and affect Lymphatic: No axillary or cervical lymphadenopathy   Musculoskeletal:  Left shoulder, elbow, wrist, digits- no skin wounds, TTP shoulder, swollen, ecchymotic             Sens  Ax/R/M/U intact             Mot   Ax/ R/ PIN/ M/ AIN/ U intact             Rad 2+  Assessment: 1. Closed left proximal humerus fracture. 2. CHF 3. AFIb 4. CKD stage IV 5.  DM 6. COPD  Plan: - Admitted to my service for operative management of his proximal humerus fracture tomorrow.  He is  nothing by mouth tonight at midnight.  We are holding warfarin in preparation for surgery and will begin that on postoperative day #1. - The risks, benefits, and alternatives were discussed with the patient. There are risks associated with the surgery including, but not limited to, problems with anesthesia (death), infection, fracture of bones, loosening or failure of implants, malunion, nonunion, hematoma (blood accumulation) which may require surgical drainage, blood clots, pulmonary embolism, nerve injury, and blood vessel injury. The patient understands these risks and elects to proceed.  -He was previously admitted to the hospitalist service due to his complex medical problems.  I will reconsult the hospitalist during this admission for comanagement of him while he is here in the inpatient setting.  At this time we have restarted his home medications.     Nicholes Stairs, MD Cell [phone]    12/26/2016 9:12 PM

## 2016-12-26 NOTE — Progress Notes (Signed)
I spoke to Mr Ronald [name], she reports that patient had a miserable night and feels that patient should be admitted today to the hospital.  Family is waiting to hear from Dr Stann Mainland office.  I asked when patient had Coumadin last and she said 12/23/16.

## 2016-12-26 NOTE — Consult Note (Signed)
Medical Consultation   Ronald [name]  DJT:[id number]  DOB: [birth year]  DOA: 12/26/2016  PCP: [name], MD (Confirm with patient/family/NH records and if not entered, this has to be entered at [facility] point of entry)  Outpatient Specialists: Dr. Stann Mainland   Requesting physician: Dr. Stann Mainland  Reason for consultation: Medical management during admit   History of Present Illness: Ronald [name] is an 83 y.o. male with h/o A.Fib on coumadin, prior GIBs, bovine AVR, diastolic CHF, COPD, chronic mild thrombocytopenia, DM2.  Patient was recently admitted on 10/29 for L proximal humeral fx.  Surgery was planned for 11/2 but patient left AMA on 10/31 with plans to return for surgery.  During that admit he was noted to be anemic with HGB in the 7-8 range and ultimately transfused 1 unit.  This was believed to be due to hematoma about the fracture site, although hemoccult not obtained during that admit.  Coumadin was held after 10/29 and remains on hold.  He returned today 11/1 due to severe pain and was admitted by Dr. Stann Mainland, current plan is for surgery tomorrow.   Review of Systems:  ROS As per HPI otherwise 10 point review of systems negative.    Past Medical History: Past Medical History:  Diagnosis Date  . AS (aortic stenosis)    bovine aortic valve replacement 01/07/11  . Bladder cancer [name])    Bladder Cancer local  . Blood transfusion    w/hip operation  . Cellulitis of left lower extremity   . Chronic atrial fibrillation (Ronald [name])   . Chronic diastolic congestive heart failure (Ronald [name])   . Claudication in peripheral vascular disease ([place]) 06/17/2011  . COPD (chronic obstructive pulmonary disease) (Ronald [name])   . Depression wife died 4 years ago.    Ronald [name] History of stomach ulcers ~ 1951  . HTN (hypertension)   . Neuropathy due to secondary diabetes (Ronald [name])   . Normal coronary arteries Sept 2012  . Peripheral vascular disease (Ronald [name]) very poor circulation legs and feet ... stents  right and left legs... done in dr j. Gwenlyn Found 's office.   . Pneumonia 07/25/11   left  . Recurrent upper respiratory infection (URI)    sinusitis  . Renal artery stenosis (Ronald [name]) 2006   renal artery stent  . S/P angioplasty with stent, diamond back rotational athrectomy Prox. Rt. SFA 06/17/2011 06/17/2011  . Shortness of breath   . Thrombocytopenia due to drugs    seen by Dr Inda Merlin plts [id number] no rx  . Type II diabetes mellitus (Ronald [name])     Past Surgical History: Past Surgical History:  Procedure Laterality Date  . ABDOMINAL ANGIOGRAM  06/17/2011   Procedure: ABDOMINAL ANGIOGRAM;  Surgeon: Lorretta Harp, MD;  Location: [name] CATH LAB;  Service: Cardiovascular;;  . AORTIC VALVE REPLACEMENT  01/07/2011   Procedure: AORTIC VALVE REPLACEMENT (AVR);  Surgeon: Grace Isaac, MD;  Location: [place];  Service: Open Heart Surgery;  Laterality: N/A;; magna-ease bovine 37mm bioprosthesis  . ATHERECTOMY N/A 06/17/2011   Procedure: ATHERECTOMY;  Surgeon: Lorretta Harp, MD;  Location: [facility] CATH LAB;  Service: Cardiovascular;  Laterality: N/A;  . CARDIAC CATHETERIZATION  11/19/10   normal coronaries, mod AS, 75% l RAS  . CARDIOVERSION  04/03/2011   Procedure: CARDIOVERSION;  Surgeon: Leonie Man, MD;  Location: [name];  Service: Cardiovascular;  Laterality: N/A;  . CATARACT EXTRACTION W/ INTRAOCULAR LENS  IMPLANT, BILATERAL  ~  2007  . CHOLECYSTECTOMY    . FEMUR IM NAIL  06/27/2011   Procedure: INTRAMEDULLARY (IM) NAIL FEMORAL;  Surgeon: Marin Shutter, MD;  [facility];  Service: Orthopedics;  Laterality: Right;  . FEMUR IM NAIL Left 12/14/2013   Procedure: INTRAMEDULLARY (IM) NAIL FEMORAL;  Surgeon: Ronald Pole, MD;  [facility];  Service: Orthopedics;  Laterality: Left;  . LOWER EXTREMITY ANGIOGRAM  06/17/2011   diamondback orbital rotational and cutting balloon atherectomy of the prox R SFA  . LOWER EXTREMITY ANGIOGRAM Bilateral 06/17/2011   Procedure: LOWER EXTREMITY ANGIOGRAM;  Surgeon:  Lorretta Harp, MD;  Location: [facility] CATH LAB;  Service: Cardiovascular;  Laterality: Bilateral;  . PERIPHERAL ARTERIAL STENT GRAFT     2006 left anf right illiac stents Dr Ronald [name]  . RENAL ARTERY STENT  2006   "I believe"  . TONSILLECTOMY AND ADENOIDECTOMY     "when I was a kid"     Allergies:   Allergies  Allergen Reactions  . Aspirin Anaphylaxis, Shortness Of Breath and Rash    "broke out in white welts; red blotches neck and face; windpipe closing; ~ 1962"     Social History:  reports that he has been smoking Cigarettes.  He has a 112.50 pack-year smoking history. He has never used smokeless tobacco. He reports that he does not drink alcohol or use drugs.   Family History: Family History  Problem Relation Age of Onset  . Heart disease Mother   . Cancer Brother        colon  . Stroke Father     Unacceptable: Noncontributory, unremarkable, or negative. Acceptable: Family history reviewed and not pertinent (If you reviewed it)   Physical Exam: Vitals:   12/26/16 1739 12/26/16 1900  BP: (!) 113/43 (!) 152/42  Pulse: 65 74  Resp: 16 16  Temp: 98.2 F (36.8 C) 98.6 F (37 C)  TempSrc: Oral Oral  SpO2: 98% 94%  Weight: 81.6 kg (179 lb 14.3 oz)   Height: 5\' 11"  (1.803 m)     Constitutional: Alert and awake, oriented x3, not in any acute distress. Eyes: PERLA, EOMI, irises appear normal, anicteric sclera,  ENMT: external ears and nose appear normal            Lips appears normal, oropharynx mucosa, tongue, posterior pharynx appear normal  Neck: neck appears normal, no masses, normal ROM, no thyromegaly, no JVD  CVS: S1-S2 clear, no murmur rubs or gallops, no LE edema, normal pedal pulses  Respiratory:  clear to auscultation bilaterally, no wheezing, rales or rhonchi. Respiratory effort normal. No accessory muscle use.  Abdomen: soft nontender, nondistended, normal bowel sounds, no hepatosplenomegaly, no hernias  Musculoskeletal: : LUE not assessed due to known  fx. Neuro: Cranial nerves II-XII intact, strength, sensation, reflexes Psych: judgement and insight appear normal, stable mood and affect, mental status Skin: numerous bruises on skin, both arms.  Bruising L shoulder  Data reviewed:  I have personally reviewed following labs and imaging studies Labs:  CBC:  Recent Labs Lab 12/23/16 1217 12/24/16 0546 12/24/16 1659  WBC 15.2* 8.2  --   HGB 8.0* 6.5* 8.4*  HCT 25.3* 20.5* 25.4*  MCV 93.0 92.3  --   PLT 104* 98*  --     Basic Metabolic Panel:  Recent Labs Lab 12/23/16 1217 12/24/16 0546  NA 138 138  K 3.4* 3.0*  CL 104 105  CO2 26 25  GLUCOSE 257* 174*  BUN 20 19  CREATININE 1.52*  1.50*  CALCIUM 8.6* 8.3*   GFR Estimated Creatinine Clearance: 33.5 mL/min (A) (by C-G formula based on SCr of 1.5 mg/dL (H)). Liver Function Tests: No results for input(s): AST, ALT, ALKPHOS, BILITOT, PROT, ALBUMIN in the last 168 hours. No results for input(s): LIPASE, AMYLASE in the last 168 hours. No results for input(s): AMMONIA in the last 168 hours. Coagulation profile  Recent Labs Lab 12/23/16 1217 12/24/16 0546  INR 2.14 2.75    Cardiac Enzymes: No results for input(s): CKTOTAL, CKMB, CKMBINDEX, TROPONINI in the last 168 hours. BNP: Invalid input(s): POCBNP CBG:  Recent Labs Lab 12/24/16 1232 12/24/16 1703 12/24/16 2322 12/25/16 0810 12/25/16 1225  GLUCAP 184* 221* 184* 155* 275*   D-Dimer No results for input(s): DDIMER in the last 72 hours. Hgb A1c No results for input(s): HGBA1C in the last 72 hours. Lipid Profile No results for input(s): CHOL, HDL, LDLCALC, TRIG, CHOLHDL, LDLDIRECT in the last 72 hours. Thyroid function studies No results for input(s): TSH, T4TOTAL, T3FREE, THYROIDAB in the last 72 hours.  Invalid input(s): FREET3 Anemia work up No results for input(s): VITAMINB12, FOLATE, FERRITIN, TIBC, IRON, RETICCTPCT in the last 72 hours. Urinalysis    Component Value Date/Time   COLORURINE  YELLOW 12/13/2013 1556   APPEARANCEUR CLOUDY (A) 12/13/2013 1556   LABSPEC 1.012 12/13/2013 1556   PHURINE 6.0 12/13/2013 1556   GLUCOSEU NEGATIVE 12/13/2013 1556   HGBUR NEGATIVE 12/13/2013 1556   BILIRUBINUR NEGATIVE 12/13/2013 1556   KETONESUR NEGATIVE 12/13/2013 1556   PROTEINUR NEGATIVE 12/13/2013 1556   UROBILINOGEN 1.0 12/13/2013 1556   NITRITE NEGATIVE 12/13/2013 1556   LEUKOCYTESUR NEGATIVE 12/13/2013 1556     Microbiology No results found for this or any previous visit (from the past 240 hour(s)).     Inpatient Medications:   Scheduled Meds: . buPROPion  150 mg Oral Daily  . [START ON 12/27/2016] chlorhexidine  60 mL Topical Once  . doxazosin  4 mg Oral QPM  . ferrous sulfate  325 mg Oral BID  . finasteride  5 mg Oral Daily  . fluticasone  1 spray Each Nare BID  . [START ON 12/27/2016] furosemide  40 mg Oral BID  . insulin aspart  0-9 Units Subcutaneous Q4H  . insulin glargine  12 Units Subcutaneous Daily  . loratadine  10 mg Oral Daily  . metoprolol succinate  50 mg Oral BID  . potassium chloride SA  20 mEq Oral Daily  . senna-docusate  1 tablet Oral QHS  . simvastatin  20 mg Oral QPM  . umeclidinium-vilanterol  1 puff Inhalation Daily   Continuous Infusions: . [START ON 12/27/2016]  ceFAZolin (ANCEF) IV    . methocarbamol (ROBAXIN)  IV       Radiological Exams on Admission: Ct Shoulder Left Wo Contrast  Result Date: 12/25/2016 CLINICAL DATA:  The patient suffered a proximal left humerus fracture in a fall 12/23/2016. Initial encounter. EXAM: CT OF THE UPPER LEFT EXTREMITY WITHOUT CONTRAST TECHNIQUE: Multidetector CT imaging of the upper left extremity was performed according to the standard protocol. COMPARISON:  Plain films left shoulder 12/23/2016. FINDINGS: Bones/Joint/Cartilage The patient has a comminuted fracture of the proximal left humerus. The fracture is oblique in orientation extending from the anterior cortex at the level of the surgical neck in a  posterior and inferior orientation through the proximal diaphysis of the humerus. There are 4 main fracture fragments. Largest floating fragment posterior cortex measures approximately 2 cm transverse by 4.5 cm craniocaudal. The diaphysis of the  humerus demonstrates approximately 1 shaft width anterior displacement and foreshortening of approximately 3 cm. The humeral head is rotated medially due to retraction by the supraspinatus. The humeral head is located. Moderate acromioclavicular osteoarthritis is seen. Type 2 acromion is noted. Ligaments Suboptimally assessed by CT. Muscles and Tendons The rotator cuff appears intact.  No muscle atrophy is identified. Soft tissues Soft tissue edema and contusion are seen about the patient's fracture. IMPRESSION: Comminuted fracture of the proximal humerus is impacted and displaced as described above. The fracture does not involve the greater or lesser tuberosities. Electronically Signed   By: Inge Rise M.D.   On: 12/25/2016 09:39    Impression/Recommendations Principal Problem:   Closed fracture of left proximal humerus Active Problems:   Diabetes mellitus with complication (HCC)   Chronic atrial fibrillation (HCC)   COPD (chronic obstructive pulmonary disease) (HCC)   Chronic diastolic heart failure (HCC)   Chronic renal insufficiency, stage IV (severe) (HCC)   Anemia   1. Closed fx of left proximal humerus - 1. Planned surgery tomorrow 2. Admission labs pending 2. Anemia - 1. Acute on anemia of chronic disease 2. HGB pending for this admit 1. Transfuse pre-op if needed 3. Note that he had blood transfusion on 10/29 4. Last admit believed to be due to the substantial hematoma about the fx site 5. Hemoccult ordered and pending, no recent stigmata of major GIB on HPI though per history 6. Coumadin on hold 7. INR pending 3. DM2 - 1. Half home lantus dose (12 units daily, patient takes in AM) 2. Sensitive scale SSI q4h 3. Hold  glyburide 4. Chronic A.Fib - 1. Coumadin on hold 2. Continue toprol 3. Will also order SCDs for DVT ppx 5. CHF - 1. Continue lasix 40mg  PO BID 6. CKD stage 3 - appears chronic and creatinine at his 1.5 baseline  Thank you for this consultation.  Our [name] hospitalist team will follow the patient with you.   Time Spent: 80 min  Karmina Zufall M. D.O. [facility] 12/26/2016, 10:17 PM

## 2016-12-27 DIAGNOSIS — J439 Emphysema, unspecified: Secondary | ICD-10-CM

## 2016-12-27 LAB — PREPARE RBC (CROSSMATCH)

## 2016-12-27 LAB — GLUCOSE, CAPILLARY
GLUCOSE-CAPILLARY: 57 mg/dL — AB (ref 65–99)
GLUCOSE-CAPILLARY: 129 mg/dL — AB (ref 65–99)
GLUCOSE-CAPILLARY: 215 mg/dL — AB (ref 65–99)
Glucose-Capillary: 73 mg/dL (ref 65–99)
Glucose-Capillary: 202 mg/dL — ABNORMAL HIGH (ref 65–99)
Glucose-Capillary: 214 mg/dL — ABNORMAL HIGH (ref 65–99)
Glucose-Capillary: 266 mg/dL — ABNORMAL HIGH (ref 65–99)

## 2016-12-27 LAB — SURGICAL PCR SCREEN
MRSA, PCR: NEGATIVE
Staphylococcus aureus: NEGATIVE

## 2016-12-27 LAB — HEMOGLOBIN AND HEMATOCRIT, BLOOD
HCT: 28.7 % — ABNORMAL LOW (ref 39.0–52.0)
HEMOGLOBIN: 9.4 g/dL — AB (ref 13.0–17.0)

## 2016-12-27 MED ORDER — SODIUM CHLORIDE 0.9 % IV SOLN
Freq: Once | INTRAVENOUS | Status: AC
  Administered 2016-12-27: 11:00:00 via INTRAVENOUS

## 2016-12-27 NOTE — Progress Notes (Signed)
Hypoglycemic Event  CBG: 57  Treatment: 15 GM carbohydrate snack  Symptoms: None  Follow-up CBG: Time: 0940 CBG Result: 129  Possible Reasons for Event: pt was NPO for possible surgery today, however, surgery is post-poned until next week so patient now able to eat.   Comments/MD notified: Dr. Myer Haff new orders at this time.     Latorsha Curling

## 2016-12-27 NOTE — Progress Notes (Signed)
   Subjective:  Patient reports pain as mild.  Resting currently with blood transfusion going in.  Denies SOB, CP, or n/v.    Objective:   VITALS:   Vitals:   12/26/16 2320 12/27/16 0607 12/27/16 1145 12/27/16 1200  BP:  (!) 122/48 (!) 131/46 (!) 130/42  Pulse: 63 (!) 59 73 93  Resp:  16 16 16   Temp:  98.1 F (36.7 C) (!) 97.5 F (36.4 C) (!) 97.5 F (36.4 C)  TempSrc:  Oral Axillary Axillary  SpO2:  98% 97% 97%  Weight:      Height:       Large left shoulder hematoma, with TTP along proximal humerus +SILT ax/mc/mabc/med/rad/uln Motor intact ax/MC/med/ain/rad/pin/uln 2 + rad pulse   Lab Results  Component Value Date   WBC 12.1 (H) 12/26/2016   HGB 7.1 (L) 12/26/2016   HCT 21.5 (L) 12/26/2016   MCV 93.5 12/26/2016   PLT 109 (L) 12/26/2016   BMET    Component Value Date/Time   NA 138 12/26/2016 2249   K 2.8 (L) 12/26/2016 2249   CL 101 12/26/2016 2249   CO2 29 12/26/2016 2249   GLUCOSE 137 (H) 12/26/2016 2249   BUN 29 (H) 12/26/2016 2249   CREATININE 1.79 (H) 12/26/2016 2249   CREATININE 1.50 (H) 10/18/2015 1140   CALCIUM 8.6 (L) 12/26/2016 2249   GFRNONAA 31 (L) 12/26/2016 2249   GFRAA 36 (L) 12/26/2016 2249     Assessment/Plan:     Principal Problem:   Closed fracture of left proximal humerus Active Problems:   Diabetes mellitus with complication (HCC)   Chronic atrial fibrillation (HCC)   COPD (chronic obstructive pulmonary disease) (HCC)   Chronic diastolic heart failure (HCC)   Chronic renal insufficiency, stage IV (severe) (HCC)   Anemia  Left proximal humerus fracture- NWB LUE with sling for comfort Will need reverse arthroplasty for restoration of function.  Unfortunately he left hospital AMA and on his return late yesterday his screening labs demonstrated persistent anemia of 7.1, as well as elevated INR at 1.5.  We are holding on surgery today due to increased risk with a large surgery.  Plan for surgery Tuesday at 1 pm.  Will keep inpatient  until surgery to optimize medical treatment and preop state.  Encouraged out of bed throughout the day with help.  PT re-ordered  Appreciate greatly Medicine team help with his anemia, anticoagulation, CHF, CKD, DM, and Afib  Diet- Carb modified  F/E/N- replete prn, no maintenance fluids  Dispo- Pnding post operative course, followign surgery Tuesday  Nicholes Stairs 12/27/2016, 2:24 PM   Geralynn Rile, MD [phone]

## 2016-12-27 NOTE — Progress Notes (Signed)
Ortho Note  Based on preoperative labs late last night, we will cancel surgery today and delay until next Tuesday.  This will allow for INR to normalize and anemia to be corrected.

## 2016-12-27 NOTE — Progress Notes (Signed)
PROGRESS NOTE    Ronald [name]  [id number]:[id number] DOB: 1922/07/21 DOA: 12/26/2016 PCP: [name], MD   Brief Narrative: Ronald [name] is a 83 y.o. male with a history of AS s/p bovine AVR, chronic diastolic CHF, CAD, COPD, Depression, hypertension, thrombocytopenia, diabetes mellitus. He presented with left shoulder pain found to have a humeral fracture. He left against recommendations and has returned for his surgery, which has unfortunately been cancelled secondary to anemia.   Assessment & Plan:   Principal Problem:   Closed fracture of left proximal humerus Active Problems:   Diabetes mellitus with complication (HCC)   Chronic atrial fibrillation (HCC)   COPD (chronic obstructive pulmonary disease) (HCC)   Chronic diastolic heart failure (HCC)   Chronic renal insufficiency, stage IV (severe) (HCC)   Anemia   Comminuted left humeral fracture Patient was previously admitted for this fracture but left against medical advice to attend to some home affairs. Plan was for surgery today, however, patient's hemoglobin has dropped again. -transfuse 2 units PRBC -analgesics prn -orthopedic surgery recommendations  Anemia Patient is s/p 1 unit of PRBC on 12/24/16. Hemoglobin trended back down. Secondary to acute blood loss from fracture and hematoma. -2 units PRBC  Aortic stenosis Status post AVR in 2012.  Chronic atrial fibrillation Rate controlled. Coumadin held secondary to anticipated surgery. -Continue metoprolol.    COPD Stable.  Chronic diastolic heart failure Euvolemic.  CKD stage 4 Stable.  Chronic thrombocytopenia Worsened in setting of acute bleeding. -trend CBC   DVT prophylaxis: SCDs Code Status: Full code Family Communication: Daughter, son and son-in-law at bedside Disposition Plan: Discharge pending orthopedic surgery management   Consultants:   Orthopedic surgery  Procedures:   2 units PRBC (11/2)  Antimicrobials:  None     Subjective: Left shoulder pain  Objective: Vitals:   12/26/16 2320 12/27/16 0607 12/27/16 1145 12/27/16 1200  BP:  (!) 122/48 (!) 131/46 (!) 130/42  Pulse: 63 (!) 59 73 93  Resp:  16 16 16   Temp:  98.1 F (36.7 C) (!) 97.5 F (36.4 C) (!) 97.5 F (36.4 C)  TempSrc:  Oral Axillary Axillary  SpO2:  98% 97% 97%  Weight:      Height:        Intake/Output Summary (Last 24 hours) at 12/27/16 1253 Last data filed at 12/27/16 0900  Gross per 24 hour  Intake              600 ml  Output              900 ml  Net             -300 ml   Filed Weights   12/26/16 1739  Weight: 81.6 kg (179 lb 14.3 oz)    Examination:  General exam: Appears calm and comfortable Respiratory system: Clear to auscultation. Respiratory effort normal. Cardiovascular system: S1 & S2 heard, RRR. No murmurs, rubs, gallops or clicks. Gastrointestinal system: Abdomen is nondistended, soft and nontender. No organomegaly or masses felt. Normal bowel sounds heard. Central nervous system: Alert and oriented. No focal neurological deficits. Extremities: No edema. No calf tenderness Skin: Significant ecchymosis on left shoulder Psychiatry: Judgement and insight appear normal. Mood & affect appropriate.    Data Reviewed: I have personally reviewed following labs and imaging studies  CBC:  Recent Labs Lab 12/23/16 1217 12/24/16 0546 12/24/16 1659 12/26/16 2249  WBC 15.2* 8.2  --  12.1*  HGB 8.0* 6.5* 8.4* 7.1*  HCT 25.3* 20.5* 25.4*  21.5*  MCV 93.0 92.3  --  93.5  PLT 104* 98*  --  563*   Basic Metabolic Panel:  Recent Labs Lab 12/23/16 1217 12/24/16 0546 12/26/16 2249  NA 138 138 138  K 3.4* 3.0* 2.8*  CL 104 105 101  CO2 26 25 29   GLUCOSE 257* 174* 137*  BUN 20 19 29*  CREATININE 1.52* 1.50* 1.79*  CALCIUM 8.6* 8.3* 8.6*   GFR: Estimated Creatinine Clearance: 28 mL/min (A) (by C-G formula based on SCr of 1.79 mg/dL (H)). Liver Function Tests:  Recent Labs Lab 12/26/16 2249  AST  22  ALT 19  ALKPHOS 61  BILITOT 1.3*  PROT 5.4*  ALBUMIN 2.7*   No results for input(s): LIPASE, AMYLASE in the last 168 hours. No results for input(s): AMMONIA in the last 168 hours. Coagulation Profile:  Recent Labs Lab 12/23/16 1217 12/24/16 0546 12/26/16 2249  INR 2.14 2.75 1.50   Cardiac Enzymes: No results for input(s): CKTOTAL, CKMB, CKMBINDEX, TROPONINI in the last 168 hours. BNP (last 3 results) No results for input(s): PROBNP in the last 8760 hours. HbA1C: No results for input(s): HGBA1C in the last 72 hours. CBG:  Recent Labs Lab 12/27/16 0424 12/27/16 0850 12/27/16 0940 12/27/16 1244 12/27/16 1246  GLUCAP 73 57* 129* 202* 215*   Lipid Profile: No results for input(s): CHOL, HDL, LDLCALC, TRIG, CHOLHDL, LDLDIRECT in the last 72 hours. Thyroid Function Tests: No results for input(s): TSH, T4TOTAL, FREET4, T3FREE, THYROIDAB in the last 72 hours. Anemia Panel: No results for input(s): VITAMINB12, FOLATE, FERRITIN, TIBC, IRON, RETICCTPCT in the last 72 hours. Sepsis Labs: No results for input(s): PROCALCITON, LATICACIDVEN in the last 168 hours.  Recent Results (from the past 240 hour(s))  Surgical PCR screen     Status: None   Collection Time: 12/26/16 11:42 PM  Result Value Ref Range Status   MRSA, PCR NEGATIVE NEGATIVE Final   Staphylococcus aureus NEGATIVE NEGATIVE Final    Comment: (NOTE) The Xpert SA Assay (FDA approved for NASAL specimens in patients 37 years of age and older), is one component of a comprehensive surveillance program. It is not intended to diagnose infection nor to guide or monitor treatment.          Radiology Studies: No results found.      Scheduled Meds: . buPROPion  150 mg Oral Daily  . chlorhexidine  60 mL Topical Once  . doxazosin  4 mg Oral QPM  . ferrous sulfate  325 mg Oral BID  . finasteride  5 mg Oral Daily  . fluticasone  1 spray Each Nare BID  . furosemide  40 mg Oral BID  . insulin aspart  0-9  Units Subcutaneous Q4H  . insulin glargine  12 Units Subcutaneous Daily  . loratadine  10 mg Oral Daily  . metoprolol succinate  50 mg Oral BID  . potassium chloride SA  20 mEq Oral Daily  . senna-docusate  1 tablet Oral QHS  . simvastatin  20 mg Oral QPM  . umeclidinium-vilanterol  1 puff Inhalation Daily   Continuous Infusions: .  ceFAZolin (ANCEF) IV    . methocarbamol (ROBAXIN)  IV       LOS: 1 day     Cordelia Poche, MD [facility] 12/27/2016, 12:53 PM Pager: [phone]  If 7PM-7AM, please contact night-coverage [email or web address] Password [email or web address] 12/27/2016, 12:53 PM

## 2016-12-28 LAB — BPAM RBC
BLOOD PRODUCT EXPIRATION DATE: 201811212359
Blood Product Expiration Date: 201811192359
ISSUE DATE / TIME: 201811021502
ISSUE DATE / TIME: 201811021133
UNIT TYPE AND RH: 5100
Unit Type and Rh: 5100

## 2016-12-28 LAB — TYPE AND SCREEN
ABO/RH(D): O POS
Antibody Screen: NEGATIVE
UNIT DIVISION: 0
Unit division: 0

## 2016-12-28 LAB — BASIC METABOLIC PANEL
Anion gap: 7 (ref 5–15)
BUN: 32 mg/dL — AB (ref 6–20)
CALCIUM: 8.4 mg/dL — AB (ref 8.9–10.3)
CO2: 29 mmol/L (ref 22–32)
Chloride: 101 mmol/L (ref 101–111)
Creatinine, Ser: 1.73 mg/dL — ABNORMAL HIGH (ref 0.61–1.24)
GFR calc Af Amer: 38 mL/min — ABNORMAL LOW (ref >60)
GFR, EST NON AFRICAN AMERICAN: 33 mL/min — AB (ref >60)
Glucose, Bld: 264 mg/dL — ABNORMAL HIGH (ref 65–99)
Potassium: 2.9 mmol/L — ABNORMAL LOW (ref 3.5–5.1)
SODIUM: 137 mmol/L (ref 135–145)

## 2016-12-28 LAB — GLUCOSE, CAPILLARY
GLUCOSE-CAPILLARY: 179 mg/dL — AB (ref 65–99)
GLUCOSE-CAPILLARY: 198 mg/dL — AB (ref 65–99)
GLUCOSE-CAPILLARY: 274 mg/dL — AB (ref 65–99)
Glucose-Capillary: 115 mg/dL — ABNORMAL HIGH (ref 65–99)
Glucose-Capillary: 266 mg/dL — ABNORMAL HIGH (ref 65–99)
Glucose-Capillary: 310 mg/dL — ABNORMAL HIGH (ref 65–99)
Glucose-Capillary: 319 mg/dL — ABNORMAL HIGH (ref 65–99)

## 2016-12-28 LAB — CBC
HEMATOCRIT: 30.4 % — AB (ref 39.0–52.0)
HEMOGLOBIN: 10 g/dL — AB (ref 13.0–17.0)
MCH: 30.9 pg (ref 26.0–34.0)
MCHC: 32.9 g/dL (ref 30.0–36.0)
MCV: 93.8 fL (ref 78.0–100.0)
Platelets: 119 10*3/uL — ABNORMAL LOW (ref 150–400)
RBC: 3.24 MIL/uL — ABNORMAL LOW (ref 4.22–5.81)
RDW: 19 % — AB (ref 11.5–15.5)
WBC: 11.5 10*3/uL — AB (ref 4.0–10.5)

## 2016-12-28 LAB — HEPARIN LEVEL (UNFRACTIONATED): HEPARIN UNFRACTIONATED: 0.18 [IU]/mL — AB (ref 0.30–0.70)

## 2016-12-28 LAB — PROTIME-INR
INR: 1.22
Prothrombin Time: 15.3 seconds — ABNORMAL HIGH (ref 11.4–15.2)

## 2016-12-28 MED ORDER — HEPARIN (PORCINE) IN NACL 100-0.45 UNIT/ML-% IJ SOLN
1350.0000 [IU]/h | INTRAMUSCULAR | Status: DC
  Administered 2016-12-28: 1200 [IU]/h via INTRAVENOUS
  Filled 2016-12-28 (×2): qty 250

## 2016-12-28 NOTE — Progress Notes (Signed)
ANTICOAGULATION CONSULT NOTE - Initial Consult  Pharmacy Consult for hepari Indication: Afib and tissue AVR  Allergies  Allergen Reactions  . Aspirin Anaphylaxis, Shortness Of Breath and Rash    "broke out in white welts; red blotches neck and face; windpipe closing; ~ 1962"    Patient Measurements: Height: 5\' 11"  (180.3 cm) Weight: 179 lb 14.3 oz (81.6 kg) IBW/kg (Calculated) : 75.3  Vital Signs: Temp: 98.6 F (37 C) (11/03 0300) Temp Source: Oral (11/03 0300) BP: 160/66 (11/03 0300) Pulse Rate: 86 (11/03 0300)  Labs:  Recent Labs  12/26/16 2249 12/27/16 1918 12/28/16 0453  HGB 7.1* 9.4* 10.0*  HCT 21.5* 28.7* 30.4*  PLT 109*  --  119*  LABPROT 18.0*  --  15.3*  INR 1.50  --  1.22  CREATININE 1.79*  --   --     Estimated Creatinine Clearance: 28 mL/min (A) (by C-G formula based on SCr of 1.79 mg/dL (H)).   Medical History: Past Medical History:  Diagnosis Date  . AS (aortic stenosis)    bovine aortic valve replacement 01/07/11  . Bladder cancer [facility])    Bladder Cancer local  . Blood transfusion    w/hip operation  . Cellulitis of left lower extremity   . Chronic atrial fibrillation ([place])   . Chronic diastolic congestive heart failure ([place])   . Claudication in peripheral vascular disease ([place]) 06/17/2011  . COPD (chronic obstructive pulmonary disease) ([place])   . Depression wife died 4 years ago.    Marland Kitchen History of stomach ulcers ~ 1951  . HTN (hypertension)   . Neuropathy due to secondary diabetes ([place])   . Normal coronary arteries Sept 2012  . Peripheral vascular disease ([place]) very poor circulation legs and feet ... stents right and left legs... done in dr j. Gwenlyn Found 's office.   . Pneumonia 07/25/11   left  . Recurrent upper respiratory infection (URI)    sinusitis  . Renal artery stenosis ([place]) 2006   renal artery stent  . S/P angioplasty with stent, diamond back rotational athrectomy Prox. Rt. SFA 06/17/2011 06/17/2011  . Shortness of breath   .  Thrombocytopenia due to drugs    seen by Dr Inda Merlin plts 114000 no rx  . Type II diabetes mellitus (HCC)     Medications:  Prescriptions Prior to Admission  Medication Sig Dispense Refill Last Dose  . buPROPion (WELLBUTRIN XL) 150 MG 24 hr tablet Take 150 mg by mouth daily.   12/23/2016 at Unknown time  . doxazosin (CARDURA) 4 MG tablet Take 4 mg by mouth every evening.    12/22/2016 at Unknown time  . ferrous sulfate 325 (65 FE) MG EC tablet Take 1 tablet (325 mg total) by mouth 2 (two) times daily. 60 tablet 0 12/23/2016 at Unknown time  . finasteride (PROSCAR) 5 MG tablet Take 5 mg by mouth daily.    12/23/2016 at Unknown time  . fluticasone (FLONASE) 50 MCG/ACT nasal spray Place 1 spray into both nostrils 2 (two) times daily.    12/23/2016 at Unknown time  . furosemide (LASIX) 40 MG tablet Take 1 tablet (40 mg total) by mouth daily. (Patient taking differently: Take 40 mg by mouth 2 (two) times daily. ) 60 tablet 0 12/23/2016 at Unknown time  . glyBURIDE (DIABETA) 5 MG tablet Take 2.5-10 mg by mouth 2 (two) times daily with a meal. 2 tablets in the morning and 1/2 tablet in the evening   12/23/2016 at Unknown time  . HYDROcodone-acetaminophen (NORCO/VICODIN) 5-325  MG tablet Take 1-2 tablets by mouth every 4 (four) hours as needed for moderate pain. 20 tablet 0   . Insulin Glargine (LANTUS) 100 UNIT/ML Solostar Pen Inject 24 Units into the skin daily.    12/23/2016 at Unknown time  . loratadine (CLARITIN) 10 MG tablet Take 10 mg by mouth daily.   12/23/2016 at Unknown time  . metoprolol succinate (TOPROL-XL) 50 MG 24 hr tablet Take 50 mg by mouth 2 (two) times daily. Take with or immediately following a meal.   12/23/2016 at 8am  . OVER THE COUNTER MEDICATION Take 1 capsule by mouth 2 (two) times daily. "Higher Thoughts"   12/23/2016 at Unknown time  . polyethylene glycol (MIRALAX / GLYCOLAX) packet Take 17 g by mouth daily. 14 each 0   . potassium chloride SA (K-DUR,KLOR-CON) 20 MEQ tablet  Take 20 mEq by mouth daily.   12/23/2016 at Unknown time  . senna-docusate (SENOKOT-S) 8.6-50 MG tablet Take 1 tablet by mouth at bedtime. 10 tablet 0   . simvastatin (ZOCOR) 20 MG tablet Take 20 mg by mouth every evening.   12/22/2016 at Unknown time  . traMADol (ULTRAM) 50 MG tablet Take 50 mg by mouth every evening.    12/22/2016 at Unknown time  . Umeclidinium-Vilanterol (ANORO ELLIPTA) 62.5-25 MCG/INH AEPB Inhale 1 puff into the lungs daily.   12/23/2016 at Unknown time   Scheduled:  . buPROPion  150 mg Oral Daily  . chlorhexidine  60 mL Topical Once  . doxazosin  4 mg Oral QPM  . ferrous sulfate  325 mg Oral BID  . finasteride  5 mg Oral Daily  . fluticasone  1 spray Each Nare BID  . furosemide  40 mg Oral BID  . insulin aspart  0-9 Units Subcutaneous Q4H  . insulin glargine  12 Units Subcutaneous Daily  . loratadine  10 mg Oral Daily  . metoprolol succinate  50 mg Oral BID  . potassium chloride SA  20 mEq Oral Daily  . senna-docusate  1 tablet Oral QHS  . simvastatin  20 mg Oral QPM  . umeclidinium-vilanterol  1 puff Inhalation Daily   Infusions:  . methocarbamol (ROBAXIN)  IV      Assessment: 83yo male admitted for left proximal humerus fracture, awaiting surgical repair, now to begin heparin bridge for Afib and h/o tissue AVR while Coumadin on hold.  Goal of Therapy:  Heparin level 0.3-0.7 units/ml Monitor platelets by anticoagulation protocol: Yes   Plan:  Will begin heparin gtt at 1200 units/hr and monitor heparin levels and CBC.  Wynona Neat, PharmD, BCPS  12/28/2016,7:22 AM

## 2016-12-28 NOTE — Progress Notes (Signed)
ANTICOAGULATION CONSULT NOTE - Initial Consult  Pharmacy Consult for hepari Indication: Afib and tissue AVR  Allergies  Allergen Reactions  . Aspirin Anaphylaxis, Shortness Of Breath and Rash    "broke out in white welts; red blotches neck and face; windpipe closing; ~ 1962"    Patient Measurements: Height: 5\' 11"  (180.3 cm) Weight: 179 lb 14.3 oz (81.6 kg) IBW/kg (Calculated) : 75.3  Vital Signs: Temp: 99.5 F (37.5 C) (11/03 1300) Temp Source: Oral (11/03 1300) BP: 142/63 (11/03 1300) Pulse Rate: 78 (11/03 1300)  Labs:  Recent Labs  12/26/16 2249 12/27/16 1918 12/28/16 0453 12/28/16 1221 12/28/16 1520  HGB 7.1* 9.4* 10.0*  --   --   HCT 21.5* 28.7* 30.4*  --   --   PLT 109*  --  119*  --   --   LABPROT 18.0*  --  15.3*  --   --   INR 1.50  --  1.22  --   --   HEPARINUNFRC  --   --   --   --  0.18*  CREATININE 1.79*  --   --  1.73*  --     Estimated Creatinine Clearance: 29 mL/min (A) (by C-G formula based on SCr of 1.73 mg/dL (H)).  Assessment: 83yo male admitted for left proximal humerus fracture, awaiting surgical repair, now to begin heparin bridge for Afib and h/o tissue AVR while Coumadin on hold  Hl low at 0.18.  Goal of Therapy:  Heparin level 0.3-0.7 units/ml Monitor platelets by anticoagulation protocol: Yes   Plan:  Increase hep to 1350 units/hr Next lvl 0000 Daily CBC HL  Levester Fresh, PharmD, BCPS, BCCCP Clinical Pharmacist Clinical phone for 12/28/2016 from 7a-3:30p: [phone] If after 3:30p, please call main pharmacy at: [phone] 12/28/2016 3:59 PM

## 2016-12-28 NOTE — Progress Notes (Signed)
    Subjective:   Procedure(s) (LRB): LEFT REVERSE SHOULDER ARTHROPLASTY (Left) Patient reports pain as 5 on 0-10 scale.   Denies CP or SOB.  Voiding without difficulty. Positive flatus. Objective: Vital signs in last 24 hours: Temp:  [97.5 F (36.4 C)-98.6 F (37 C)] 98.6 F (37 C) (11/03 0300) Pulse Rate:  [71-93] 86 (11/03 0300) Resp:  [16-20] 20 (11/03 0300) BP: (130-165)/(42-66) 160/66 (11/03 0300) SpO2:  [94 %-97 %] 95 % (11/03 0738)  Intake/Output from previous day: 11/02 0701 - 11/03 0700 In: 1470 [P.O.:720; Blood:750] Out: 3600 [Urine:3600] Intake/Output this shift: No intake/output data recorded.  Labs:  Recent Labs  12/26/16 2249 12/27/16 1918 12/28/16 0453  HGB 7.1* 9.4* 10.0*    Recent Labs  12/26/16 2249 12/27/16 1918 12/28/16 0453  WBC 12.1*  --  11.5*  RBC 2.30*  --  3.24*  HCT 21.5* 28.7* 30.4*  PLT 109*  --  119*    Recent Labs  12/26/16 2249  NA 138  K 2.8*  CL 101  CO2 29  BUN 29*  CREATININE 1.79*  GLUCOSE 137*  CALCIUM 8.6*    Recent Labs  12/26/16 2249 12/28/16 0453  INR 1.50 1.22    Physical Exam: Neurologically intact Intact pulses distally Compartment soft  Assessment/Plan:   Procedure(s) (LRB): LEFT REVERSE SHOULDER ARTHROPLASTY (Left) Surgery planned for Tuesday. No change to current treatment plan  Steph Cheadle D for Dr. Melina Schools [facility] [phone] 12/28/2016, 8:57 AM

## 2016-12-28 NOTE — Progress Notes (Signed)
PROGRESS NOTE    Ronald [name]  QVZ:[id number] DOB: November 15, 1922 DOA: 12/26/2016 PCP: [name], MD   Brief Narrative: Ronald [name] is a 83 y.o. male with a history of AS s/p bovine AVR, chronic diastolic CHF, CAD, COPD, Depression, hypertension, thrombocytopenia, diabetes mellitus. He presented with left shoulder pain found to have a humeral fracture. He left against recommendations and has returned for his surgery, which has unfortunately been cancelled secondary to anemia.   Assessment & Plan:   Principal Problem:   Closed fracture of left proximal humerus Active Problems:   Diabetes mellitus with complication (HCC)   Chronic atrial fibrillation (HCC)   COPD (chronic obstructive pulmonary disease) (HCC)   Chronic diastolic heart failure (HCC)   Chronic renal insufficiency, stage IV (severe) (HCC)   Anemia   Comminuted left humeral fracture Patient was previously admitted for this fracture but left against medical advice to attend to some home affairs. Plan for surgery on 11/6 -analgesics prn -orthopedic surgery recommendations  Anemia Patient is s/p 1 unit of PRBC on 12/24/16. Hemoglobin trended back down. Secondary to acute blood loss from fracture and hematoma. S/p another 2 units of PRBC with good response. -watch CBC  Aortic stenosis Status post AVR in 2012.  Chronic atrial fibrillation Rate controlled. Coumadin held secondary to anticipated surgery. -Continue metoprolol.  -restart anticoagulation with heparin bridge. Will watch for recurrent bleeding  COPD Stable.  Chronic diastolic heart failure Euvolemic.  CKD stage 4 Stable.  Chronic thrombocytopenia Stabilized   DVT prophylaxis: SCDs Code Status: Full code Family Communication: Daughter, son and son-in-law at bedside Disposition Plan: Discharge pending orthopedic surgery management   Consultants:   Orthopedic surgery  Procedures:   2 units PRBC (11/2)  Antimicrobials:  None     Subjective: Left shoulder pain. No other concerns  Objective: Vitals:   12/27/16 1527 12/27/16 1830 12/27/16 1900 12/28/16 0300  BP: (!) 144/64 (!) 153/54 (!) 165/59 (!) 160/66  Pulse: 71 82 77 86  Resp: 18 18 20 20   Temp: 98.4 F (36.9 C) 98.6 F (37 C) 98.6 F (37 C) 98.6 F (37 C)  TempSrc: Oral Oral Oral Oral  SpO2: 95% 94% 95% 95%  Weight:      Height:        Intake/Output Summary (Last 24 hours) at 12/28/16 0710 Last data filed at 12/27/16 2300  Gross per 24 hour  Intake             1470 ml  Output             3600 ml  Net            -2130 ml   Filed Weights   12/26/16 1739  Weight: 81.6 kg (179 lb 14.3 oz)    Examination:  General exam: Appears calm and comfortable Respiratory system: Clear to auscultation. Respiratory effort normal. Cardiovascular system: S1 & S2 heard, RRR. No murmurs, rubs, gallops or clicks. Gastrointestinal system: Abdomen is nondistended, soft and nontender. No organomegaly or masses felt. Normal bowel sounds heard. Central nervous system: Alert and oriented. No focal neurological deficits. Extremities: No edema. No calf tenderness. Left shoulder deformity Skin: Significant ecchymosis on left shoulder Psychiatry: Judgement and insight appear normal. Mood & affect appropriate.    Data Reviewed: I have personally reviewed following labs and imaging studies  CBC:  Recent Labs Lab 12/23/16 1217 12/24/16 0546 12/24/16 1659 12/26/16 2249 12/27/16 1918 12/28/16 0453  WBC 15.2* 8.2  --  12.1*  --  11.5*  HGB 8.0* 6.5* 8.4* 7.1* 9.4* 10.0*  HCT 25.3* 20.5* 25.4* 21.5* 28.7* 30.4*  MCV 93.0 92.3  --  93.5  --  93.8  PLT 104* 98*  --  109*  --  536*   Basic Metabolic Panel:  Recent Labs Lab 12/23/16 1217 12/24/16 0546 12/26/16 2249  NA 138 138 138  K 3.4* 3.0* 2.8*  CL 104 105 101  CO2 26 25 29   GLUCOSE 257* 174* 137*  BUN 20 19 29*  CREATININE 1.52* 1.50* 1.79*  CALCIUM 8.6* 8.3* 8.6*   GFR: Estimated Creatinine  Clearance: 28 mL/min (A) (by C-G formula based on SCr of 1.79 mg/dL (H)). Liver Function Tests:  Recent Labs Lab 12/26/16 2249  AST 22  ALT 19  ALKPHOS 61  BILITOT 1.3*  PROT 5.4*  ALBUMIN 2.7*   No results for input(s): LIPASE, AMYLASE in the last 168 hours. No results for input(s): AMMONIA in the last 168 hours. Coagulation Profile:  Recent Labs Lab 12/23/16 1217 12/24/16 0546 12/26/16 2249 12/28/16 0453  INR 2.14 2.75 1.50 1.22   Cardiac Enzymes: No results for input(s): CKTOTAL, CKMB, CKMBINDEX, TROPONINI in the last 168 hours. BNP (last 3 results) No results for input(s): PROBNP in the last 8760 hours. HbA1C: No results for input(s): HGBA1C in the last 72 hours. CBG:  Recent Labs Lab 12/27/16 1246 12/27/16 1647 12/27/16 2039 12/28/16 0017 12/28/16 0415  GLUCAP 215* 214* 266* 198* 179*   Lipid Profile: No results for input(s): CHOL, HDL, LDLCALC, TRIG, CHOLHDL, LDLDIRECT in the last 72 hours. Thyroid Function Tests: No results for input(s): TSH, T4TOTAL, FREET4, T3FREE, THYROIDAB in the last 72 hours. Anemia Panel: No results for input(s): VITAMINB12, FOLATE, FERRITIN, TIBC, IRON, RETICCTPCT in the last 72 hours. Sepsis Labs: No results for input(s): PROCALCITON, LATICACIDVEN in the last 168 hours.  Recent Results (from the past 240 hour(s))  Surgical PCR screen     Status: None   Collection Time: 12/26/16 11:42 PM  Result Value Ref Range Status   MRSA, PCR NEGATIVE NEGATIVE Final   Staphylococcus aureus NEGATIVE NEGATIVE Final    Comment: (NOTE) The Xpert SA Assay (FDA approved for NASAL specimens in patients 34 years of age and older), is one component of a comprehensive surveillance program. It is not intended to diagnose infection nor to guide or monitor treatment.          Radiology Studies: No results found.      Scheduled Meds: . buPROPion  150 mg Oral Daily  . chlorhexidine  60 mL Topical Once  . doxazosin  4 mg Oral QPM  .  ferrous sulfate  325 mg Oral BID  . finasteride  5 mg Oral Daily  . fluticasone  1 spray Each Nare BID  . furosemide  40 mg Oral BID  . insulin aspart  0-9 Units Subcutaneous Q4H  . insulin glargine  12 Units Subcutaneous Daily  . loratadine  10 mg Oral Daily  . metoprolol succinate  50 mg Oral BID  . potassium chloride SA  20 mEq Oral Daily  . senna-docusate  1 tablet Oral QHS  . simvastatin  20 mg Oral QPM  . umeclidinium-vilanterol  1 puff Inhalation Daily   Continuous Infusions: . methocarbamol (ROBAXIN)  IV       LOS: 2 days     Cordelia Poche, MD [facility] 12/28/2016, 7:10 AM Pager: [phone]  If 7PM-7AM, please contact night-coverage [email or web address] Password [email or web address] 12/28/2016, 7:10 AM

## 2016-12-28 NOTE — Progress Notes (Signed)
Physical Therapy Treatment Patient Details Name: Ronald [name] MRN: [id number] DOB: [birth year] Today's Date: 12/28/2016    History of Present Illness Patient is a 83 y/o male previously admitted on 12/23/16 s/p mechanical fall at home; x-rays revealing L proximal humerus fracture. Patient d/c home on 12/25/16 due to personal affairs. Patient returning to hospital on 12/26/16 due to severe pain. L RTSA planned for 12/27/16, however, rescheduled for 12/31/16 due to anemia concerns. Patient with a PMHx significant for DME, PVD, HTN, aortic stenosis, chronic a-fib, COPD and depression     PT Comments    Patient re-admitted on 12/26/16 due to significant pain at L shoulder. Patient presenting today with excessive bruising to back, chest, and L UE as well as significant pain up to 9/10 at L shoulder with bed mobility and sit to stand transfers. PTA, patient reports he was only ambulating short distances and used electric scooter for longer household and community distances. Patient today requiring Mod A +2 for sit to stand transfers at bedside with use of R UE for immediate balance. Further mobility deferred at this time due to pain levels. Will plan to continue to follow patient acutely to maximize functional mobility.    Follow Up Recommendations  SNF;Supervision for mobility/OOB     Equipment Recommendations  None recommended by PT    Recommendations for Other Services OT consult     Precautions / Restrictions Precautions Precautions: Fall;Shoulder Type of Shoulder Precautions: prior to most recent admission "spoke with ortho PA for clarification on ROM orders, per ortho PA, okay for gentle PROM (pendulums only) to L shoulder"; will need further clarification; patient in sling Shoulder Interventions: Shoulder sling/immobilizer Required Braces or Orthoses: Sling Restrictions Weight Bearing Restrictions: Yes LUE Weight Bearing: Non weight bearing    Mobility  Bed Mobility Overal bed  mobility: Needs Assistance Bed Mobility: Supine to Sit;Sit to Supine     Supine to sit: Mod assist Sit to supine: Mod assist      Transfers Overall transfer level: Needs assistance Equipment used: Rolling walker (2 wheeled) Transfers: Sit to/from Stand Sit to Stand: Mod assist;+2 physical assistance         General transfer comment: patient using bed rail for sit to stand transfers as well as Mod A +2 from PT and nurse tech for standing transfer for overall safety. Patient with increased pain with all transfers  Ambulation/Gait                 Stairs            Wheelchair Mobility    Modified Rankin (Stroke Patients Only)       Balance Overall balance assessment: Needs assistance Sitting-balance support: No upper extremity supported;Feet supported Sitting balance-Leahy Scale: Fair Sitting balance - Comments: poor posturing with c-spine flexed    Standing balance support: Single extremity supported Standing balance-Leahy Scale: Poor Standing balance comment: Reliant on RUE support                             Cognition Arousal/Alertness: Awake/alert Behavior During Therapy: WFL for tasks assessed/performed Overall Cognitive Status: Within Functional Limits for tasks assessed                                        Exercises General Exercises - Lower Extremity Long Arc Quad: Both;10 reps  General Comments General comments (skin integrity, edema, etc.): excessive bruising to back, chest and L UE; L UE with noted swelling      Pertinent Vitals/Pain Pain Assessment: 0-10 Pain Score: 9  (with movement/transfers)    Home Living Family/patient expects to be discharged to:: Private residence Living Arrangements: Alone Available Help at Discharge: Family Type of Home: House Home Access: Ramped entrance   Home Layout: One level Home Equipment: Environmental consultant - 2 wheels;Walker - 4 wheels;Cane - single point;Electric scooter;Grab  bars - tub/shower;Wheelchair - manual      Prior Function Level of Independence: Independent with assistive device(s)      Comments: Reports able to amb ~30 yards with [facility]PC (switches between [place]), but uses electric scooter for longer household and community distances. pt drives   PT Goals (current goals can now be found in the care plan section) Acute Rehab PT Goals Patient Stated Goal: return home, reduce pain PT Goal Formulation: With patient Time For Goal Achievement: 01/11/17 Potential to Achieve Goals: Fair    Frequency    Min 3X/week      PT Plan      Co-evaluation              AM-PAC PT "6 Clicks" Daily Activity  Outcome Measure  Difficulty turning over in bed (including adjusting bedclothes, sheets and blankets)?: Unable Difficulty moving from lying on back to sitting on the side of the bed? : Unable Difficulty sitting down on and standing up from a chair with arms (e.g., wheelchair, bedside commode, etc,.)?: Unable Help needed moving to and from a bed to chair (including a wheelchair)?: A Lot Help needed walking in hospital room?: A Lot Help needed climbing 3-5 steps with a railing? : A Lot 6 Click Score: 9    End of Session Equipment Utilized During Treatment: Gait belt Activity Tolerance: Patient limited by pain Patient left: in bed;with call bell/phone within reach;with family/visitor present Nurse Communication: Mobility status PT Visit Diagnosis: Unsteadiness on feet (R26.81);Other abnormalities of gait and mobility (R26.89);Muscle weakness (generalized) (M62.81);Pain Pain - Right/Left: Left Pain - part of body: Shoulder     Time: 7106-2694 PT Time Calculation (min) (ACUTE ONLY): 29 min  Charges:  $Therapeutic Activity: 8-22 mins                    G Codes:       Lanney Gins, PT, DPT 12/28/16 1:01 PM

## 2016-12-28 NOTE — Evaluation (Signed)
Occupational Therapy Evaluation Patient Details Name: Ronald [name] MRN: [id number] DOB: 05-25-1922 Today's Date: 12/28/2016    History of Present Illness Patient is a 83 y/o male previously admitted on 12/23/16 s/p mechanical fall at home; x-rays revealing L proximal humerus fracture. Patient d/c home on 12/25/16 due to personal affairs. Patient returning to hospital on 12/26/16 due to severe pain. L RTSA planned for 12/27/16, however, rescheduled for 12/31/16 due to anemia concerns. Patient with a PMHx significant for DME, PVD, HTN, aortic stenosis, chronic a-fib, COPD and depression    Clinical Impression   Pt with recent admission on 12/13/16 after fall and dc. Before previous admission, pt was performing ADLs with assistance from family for IADLs. Pt currently requiring Mod-Max A for ADLs and Mod A for sit<>stand. Pt scheduled for surgery on 12/31/16. Pt will require further acute OT to facilitate safe dc and provided further education for shoulder protocal. Recommend dc to SNF for further OT to optimize safety and independence with ADLs and functional mobility.     Follow Up Recommendations  SNF;Supervision/Assistance - 24 hour    Equipment Recommendations  Other (comment) (Defer to next venue)    Recommendations for Other Services       Precautions / Restrictions Precautions Precautions: Fall;Shoulder Type of Shoulder Precautions: Prior to most recent admission "spoke with ortho PA for clarification on ROM orders, per ortho PA, okay for gentle PROM (pendulums only) to L shoulder"; will need further clarification; patient in sling Shoulder Interventions: Shoulder sling/immobilizer Required Braces or Orthoses: Sling Restrictions Weight Bearing Restrictions: Yes LUE Weight Bearing: Non weight bearing Other Position/Activity Restrictions: Sling      Mobility Bed Mobility Overal bed mobility: Needs Assistance Bed Mobility: Supine to Sit;Sit to Supine     Supine to sit: Mod  assist Sit to supine: Mod assist   General bed mobility comments: Mod A to pull into sitting. Mod A to manage LEs returning to supine  Transfers Overall transfer level: Needs assistance Equipment used: Rolling walker (2 wheeled) Transfers: Sit to/from Stand Sit to Stand: Mod assist         General transfer comment: Pt pushing up with RUE and then using OT hand to stabilize in standing. Pt taking side steps towards [facility] with single hand held A to maintain standing balance.     Balance Overall balance assessment: Needs assistance Sitting-balance support: No upper extremity supported;Feet supported Sitting balance-Leahy Scale: Fair Sitting balance - Comments: poor posturing with c-spine flexed    Standing balance support: Single extremity supported Standing balance-Leahy Scale: Poor Standing balance comment: Reliant on RUE support                            ADL either performed or assessed with clinical judgement   ADL Overall ADL's : Needs assistance/impaired Eating/Feeding: Set up;Sitting   Grooming: Minimal assistance;Sitting   Upper Body Bathing: Moderate assistance;Sitting   Lower Body Bathing: Moderate assistance;Sit to/from stand;+2 for safety/equipment   Upper Body Dressing : Moderate assistance;Sitting;Maximal assistance;Cueing for safety;Cueing for compensatory techniques;Cueing for UE precautions   Lower Body Dressing: Maximal assistance;Sit to/from stand               Functional mobility during ADLs: Moderate assistance (sit<>stand only) General ADL Comments: Pt performing bed mobility to EOB and performing hand ROM. Pt demonstrating sitting balance at EOB. Performing sit<>Stand and a few side steps to get closer to [place] Vision/History:  Wears glasses Wears Glasses: Reading only Patient Visual Report: No change from baseline       Perception     Praxis      Pertinent Vitals/Pain Pain Assessment: 0-10 Pain Score: 9   (With movement) Pain Location: LUE Pain Descriptors / Indicators: Aching;Grimacing;Guarding Pain Intervention(s): Monitored during session;Repositioned;Limited activity within patient's tolerance     Hand Dominance Right   Extremity/Trunk Assessment Upper Extremity Assessment Upper Extremity Assessment: LUE deficits/detail LUE Deficits / Details: Pt abel to tolerate AROM of digits. Significant bruising and edema along LUE and L side. Maintained sling LUE: Unable to fully assess due to immobilization;Unable to fully assess due to pain LUE Coordination: decreased fine motor;decreased gross motor   Lower Extremity Assessment Lower Extremity Assessment: Generalized weakness   Cervical / Trunk Assessment Cervical / Trunk Assessment: Kyphotic   Communication Communication Communication: HOH   Cognition Arousal/Alertness: Awake/alert Behavior During Therapy: WFL for tasks assessed/performed Overall Cognitive Status: Within Functional Limits for tasks assessed                                     General Comments  excessive bruising to back, chest and L UE; L UE with noted swelling    Exercises Exercises: General Upper Extremity;Shoulder General Exercises - Upper Extremity Digit Composite Flexion: AROM;Left;10 reps;Seated Composite Extension: AROM;Left;10 reps;Seated Shoulder Exercises Digit Composite Flexion: AROM;10 reps;Left;Seated Composite Extension: AROM;10 reps;Left;Seated Other Exercises Other Exercises: Educated pt in ROM and elevation for edema managament.    Shoulder Instructions Shoulder Instructions Correct positioning of sling/immobilizer: Maximal assistance;Caregiver independent with task    Home Living Family/patient expects to be discharged to:: Private residence Living Arrangements: Alone Available Help at Discharge: Family Type of Home: House Home Access: Ramped entrance     Home Layout: One level     Bathroom Shower/Tub: Arts administrator: Standard     Home Equipment: Environmental consultant - 2 wheels;Walker - 4 wheels;Cane - single point;Electric scooter;Grab bars - tub/shower;Wheelchair - manual          Prior Functioning/Environment Level of Independence: Needs assistance  Gait / Transfers Assistance Needed: SPC for fucntional mobility ADL's / Homemaking Assistance Needed: Family assist with medication, MD appoitments, and other IADLs   Comments: Reports able to amb ~30 yards with [facility] (switches between [place]), but uses electric scooter for longer household and community distances. pt drives        OT Problem List: Impaired UE functional use;Pain;Decreased strength;Impaired balance (sitting and/or standing);Decreased range of motion;Decreased knowledge of precautions      OT Treatment/Interventions: Self-care/ADL training;DME and/or AE instruction;Therapeutic activities;Balance training;Therapeutic exercise;Energy conservation;Patient/family education    OT Goals(Current goals can be found in the care plan section) Acute Rehab OT Goals Patient Stated Goal: return home, reduce pain OT Goal Formulation: With patient Time For Goal Achievement: 01/07/17 Potential to Achieve Goals: Good ADL Goals Pt Will Perform Grooming: with set-up;with supervision;sitting Pt Will Perform Upper Body Bathing: with min assist;sitting Pt Will Perform Upper Body Dressing: with min assist;sitting Pt Will Perform Lower Body Dressing: with min assist;sit to/from stand Pt/caregiver will Perform Home Exercise Program: Increased ROM;Increased strength;Left upper extremity;With Supervision;With written HEP provided  OT Frequency: Min 2X/week   Barriers to D/C:            Co-evaluation              AM-PAC PT "6 Clicks" Daily Activity  Outcome Measure Help from another person eating meals?: A Little Help from another person taking care of personal grooming?: A Little Help from another person toileting, which includes  using toliet, bedpan, or urinal?: A Lot Help from another person bathing (including washing, rinsing, drying)?: A Lot Help from another person to put on and taking off regular upper body clothing?: A Lot Help from another person to put on and taking off regular lower body clothing?: A Lot 6 Click Score: 14   End of Session Equipment Utilized During Treatment: Other (comment) (sling ) Nurse Communication: Mobility status  Activity Tolerance: Patient tolerated treatment well Patient left: in chair;with call bell/phone within reach;with family/visitor present  OT Visit Diagnosis: Unsteadiness on feet (R26.81);History of falling (Z91.81);Muscle weakness (generalized) (M62.81);Pain Pain - Right/Left: Left Pain - part of body: Shoulder                Time: 1610-9604 OT Time Calculation (min): 23 min Charges:  OT General Charges $OT Visit: 1 Visit OT Evaluation $OT Eval Moderate Complexity: 1 Mod OT Treatments $Self Care/Home Management : 8-22 mins G-Codes:     [place], OTR/L Acute Rehab Pager: [phone] Office: [place] 12/28/2016, 4:52 PM

## 2016-12-29 ENCOUNTER — Encounter (HOSPITAL_COMMUNITY): Payer: Self-pay | Admitting: Orthopedic Surgery

## 2016-12-29 LAB — GLUCOSE, CAPILLARY
GLUCOSE-CAPILLARY: 211 mg/dL — AB (ref 65–99)
GLUCOSE-CAPILLARY: 256 mg/dL — AB (ref 65–99)
GLUCOSE-CAPILLARY: 269 mg/dL — AB (ref 65–99)
GLUCOSE-CAPILLARY: 286 mg/dL — AB (ref 65–99)
GLUCOSE-CAPILLARY: 350 mg/dL — AB (ref 65–99)
GLUCOSE-CAPILLARY: 99 mg/dL (ref 65–99)
Glucose-Capillary: 189 mg/dL — ABNORMAL HIGH (ref 65–99)
Glucose-Capillary: 198 mg/dL — ABNORMAL HIGH (ref 65–99)

## 2016-12-29 LAB — BASIC METABOLIC PANEL
Anion gap: 9 (ref 5–15)
BUN: 30 mg/dL — ABNORMAL HIGH (ref 6–20)
CHLORIDE: 96 mmol/L — AB (ref 101–111)
CO2: 29 mmol/L (ref 22–32)
CREATININE: 1.53 mg/dL — AB (ref 0.61–1.24)
Calcium: 8 mg/dL — ABNORMAL LOW (ref 8.9–10.3)
GFR, EST AFRICAN AMERICAN: 44 mL/min — AB (ref >60)
GFR, EST NON AFRICAN AMERICAN: 38 mL/min — AB (ref >60)
Glucose, Bld: 210 mg/dL — ABNORMAL HIGH (ref 65–99)
POTASSIUM: 2.9 mmol/L — AB (ref 3.5–5.1)
SODIUM: 134 mmol/L — AB (ref 135–145)

## 2016-12-29 LAB — CBC
HCT: 27.9 % — ABNORMAL LOW (ref 39.0–52.0)
Hemoglobin: 9.2 g/dL — ABNORMAL LOW (ref 13.0–17.0)
MCH: 31.2 pg (ref 26.0–34.0)
MCHC: 33 g/dL (ref 30.0–36.0)
MCV: 94.6 fL (ref 78.0–100.0)
PLATELETS: 120 10*3/uL — AB (ref 150–400)
RBC: 2.95 MIL/uL — AB (ref 4.22–5.81)
RDW: 18.9 % — AB (ref 11.5–15.5)
WBC: 11 10*3/uL — AB (ref 4.0–10.5)

## 2016-12-29 LAB — PROTIME-INR
INR: 1.26
Prothrombin Time: 15.7 seconds — ABNORMAL HIGH (ref 11.4–15.2)

## 2016-12-29 LAB — HEPARIN LEVEL (UNFRACTIONATED)
HEPARIN UNFRACTIONATED: 0.35 [IU]/mL (ref 0.30–0.70)
Heparin Unfractionated: 0.32 IU/mL (ref 0.30–0.70)

## 2016-12-29 MED ORDER — HEPARIN (PORCINE) IN NACL 100-0.45 UNIT/ML-% IJ SOLN
1600.0000 [IU]/h | INTRAMUSCULAR | Status: DC
  Administered 2016-12-29: 1400 [IU]/h via INTRAVENOUS
  Administered 2016-12-30: 1600 [IU]/h via INTRAVENOUS
  Filled 2016-12-29 (×6): qty 250

## 2016-12-29 NOTE — Clinical Social Work Note (Signed)
Clinical Social Work Assessment  Patient Details  Name: Ronald [name] MRN: [id number] Date of Birth: April 11, 1922  Date of referral:  12/29/16               Reason for consult:  Facility Placement, Discharge Planning                Permission sought to share information with:    Permission granted to share information::     Name::        Agency::     Relationship::     Contact Information:     Housing/Transportation Living arrangements for the past 2 months:  Single Family Home Source of Information:  Patient Patient Interpreter Needed:  None Criminal Activity/Legal Involvement Pertinent to Current Situation/Hospitalization:    Significant Relationships:    Lives with:  Self Do you feel safe going back to the place where you live?  Yes Need for family participation in patient care:  Yes (Comment)  Care giving concerns: 83 y.o. male with a history of AS s/p bovine AVR, chronic diastolic CHF, CAD, COPD, Depression, hypertension, thrombocytopenia, diabetes mellitus. Patient is having surgery on shoulder this Tuesday 11/6- PT is currently recommending SNF placement.    Social Worker assessment / plan: CSW met with patient via bedside to discuss discharge plans. Patient currently lives alone and lives an active life style. Patient has had multiple broken bones/ bone replacements and has received treatment at a skilled nursing facility in the past. Patient did not voice a preference at this time. Patient stated he would like to wait until after his surgery on 11/6 to determine if SNF placement is the best option for him at this time. However, patient is agreeable to have information sent to [facility].   Plan: CSW will complete SNF work up and follow up with patient after surgery on 11/6 to determine if patient is seeking SNF placement vs. HH.   Employment status:  Retired Nurse, adult PT Recommendations:  [place] / Referral  to community resources:  [place]  Patient/Family's Response to care: Patient pleasant and appreciated CSW.   Patient/Family's Understanding of and Emotional Response to Diagnosis, Current Treatment, and Prognosis: Patient understands current treatment and prognosis.   Emotional Assessment Appearance:  Appears stated age Attitude/Demeanor/Rapport:    Affect (typically observed):  Calm, Happy, Pleasant Orientation:  Oriented to Self, Oriented to Place, Oriented to  Time, Oriented to Situation Alcohol / Substance use:    Psych involvement (Current and /or in the community):  No (Comment)  Discharge Needs  Concerns to be addressed:  No discharge needs identified Readmission within the last 30 days:  No Current discharge risk:  None Barriers to Discharge:  No Barriers Identified   Weston Anna, LCSW 12/29/2016, 11:23 AM

## 2016-12-29 NOTE — Progress Notes (Signed)
ANTICOAGULATION CONSULT NOTE - F/u Consult  Pharmacy Consult for heparin  Indication: Afib and tissue AVR  Allergies  Allergen Reactions  . Aspirin Anaphylaxis, Shortness Of Breath and Rash    "broke out in white welts; red blotches neck and face; windpipe closing; ~ 1962"    Patient Measurements: Height: 5\' 11"  (180.3 cm) Weight: 179 lb 14.3 oz (81.6 kg) IBW/kg (Calculated) : 75.3  Vital Signs: Temp: 99.5 F (37.5 C) (11/03 1900) Temp Source: Oral (11/03 1900) BP: 135/37 (11/03 1900) Pulse Rate: 70 (11/03 1900)  Labs:  Recent Labs  12/26/16 2249 12/27/16 1918 12/28/16 0453 12/28/16 1221 12/28/16 1520 12/29/16 0031  HGB 7.1* 9.4* 10.0*  --   --   --   HCT 21.5* 28.7* 30.4*  --   --   --   PLT 109*  --  119*  --   --   --   LABPROT 18.0*  --  15.3*  --   --   --   INR 1.50  --  1.22  --   --   --   HEPARINUNFRC  --   --   --   --  0.18* 0.35  CREATININE 1.79*  --   --  1.73*  --   --     Estimated Creatinine Clearance: 29 mL/min (A) (by C-G formula based on SCr of 1.73 mg/dL (H)).  Assessment: 83yo male admitted for left proximal humerus fracture, awaiting surgical repair, now to begin heparin bridge per pharmacy for Afib and h/o tissue AVR while Coumadin on hold.  Heparin level therapeutic at 0.35. No new CBC, however, no overt s/s bleeding documented.   Goal of Therapy:  Heparin level 0.3-0.7 units/ml Monitor platelets by anticoagulation protocol: Yes   Plan:  Continue heparin gtt at 1350 units/hr  Confirm heparin level in 8 hrs Daily heparin level and CBC Monitor for s/s bleeding  Lavonda Jumbo, PharmD Clinical Pharmacist 12/29/16 1:05 AM

## 2016-12-29 NOTE — NC FL2 (Signed)
[place] MEDICAID FL2 LEVEL OF CARE SCREENING TOOL     IDENTIFICATION  Patient Name: Ronald [name] Birthdate: 1922/10/26 Sex: male Admission Date (Current Location): 12/26/2016  [facility] and [place] Number:  Herbalist and Address:  [place], [place] [address], [place], [place] [address]      Provider Number: [id number]  Attending Physician Name and Address:  Nicholes Stairs, MD  Relative Name and Phone Number:       Current Level of Care: Hospital Recommended Level of Care: [place] Prior Approval Number:    Date Approved/Denied:   PASRR Number:    Discharge Plan: SNF    Current Diagnoses: Patient Active Problem List   Diagnosis Date Noted  . Closed fracture of left proximal humerus 12/26/2016  . Comminuted left humeral fracture 12/23/2016  . GI bleed 07/02/2016  . AKI (acute kidney injury) ([place]) 07/02/2016  . Lactic acidemia 07/02/2016  . Lower GI bleed   . Tobacco use 03/07/2015  . Diabetes mellitus type 2, uncontrolled ([place]) 12/20/2013  . Acute blood loss anemia 12/20/2013  . Allergic rhinitis 12/20/2013  . Intertrochanteric fracture of left femur ([place]) 12/12/2013  . Anemia 12/12/2013  . Right acetabular fracture ([place]) 04/11/2013  . Acetabular fracture ([place]) 04/11/2013  . Chronic renal insufficiency, stage IV (severe) ([place]) 12/24/2012  . Bilateral claudication of lower limb ([place]) 12/24/2012  . Cellulitis 12/02/2012  . History of tobacco abuse- 75 years, quit in Feb 2014 07/06/2012  . Chronic diastolic heart failure ([place]) 07/06/2012  . COPD (chronic obstructive pulmonary disease) ([place]) 05/27/2012  . Pulmonary nodules 05/27/2012  . Normal coronary arteries, cath 11/12 07/25/2011  . Normal left ventricular systolic function, Echo 0/27 07/25/2011  . Closed right hip fracture, ORIF 04/01/34 complicated by gluteal hematoma while being Coumadinized post op 06/27/2011  . Aspirin allergy, rash, SOB. Pt is on  Plavix 06/19/2011  . Chronic atrial fibrillation ([place]) 06/19/2011  . Thrombocytopenia ([place]) 01/11/2011  . Leukocytosis 01/11/2011  . S/P aortic valve replacement: #23 Magna Ease Edwards Pericardial Valve  November 2012 01/08/2011  . AS (aortic stenosis)- s/p tissue AVR 01/07/11   . CAROTID BRUIT- moderate ICA disease 5/14 05/31/2008  . Diabetes mellitus with complication ([place]) 64/40/3474  . Hyperlipidemia 07/11/2007  . DEPRESSION 07/11/2007  . RESTLESS LEGS SYNDROME 07/11/2007  . Essential hypertension 07/11/2007  . PVD, Rt SFA PTA/HSRA 06/17/11 07/11/2007  . DIVERTICULOSIS, COLON 07/11/2007  . RENAL CYST 07/11/2007  . ARTHRITIS 07/11/2007  . SKIN CANCER, HX OF 07/11/2007  . RHEUMATIC HEART DISEASE, HX OF 07/11/2007    Orientation RESPIRATION BLADDER Height & Weight     Self, Time, Situation, Place  Normal Continent Weight: 179 lb 14.3 oz (81.6 kg) Height:  5\' 11"  (180.3 cm)  BEHAVIORAL SYMPTOMS/MOOD NEUROLOGICAL BOWEL NUTRITION STATUS      Continent Diet(Carb modified )  AMBULATORY STATUS COMMUNICATION OF NEEDS Skin   Limited Assist Verbally Normal                       Personal Care Assistance Level of Assistance  Bathing, Feeding, Dressing Bathing Assistance: Limited assistance Feeding assistance: Independent Dressing Assistance: Limited assistance     Functional Limitations Info             SPECIAL CARE FACTORS FREQUENCY  PT (By licensed PT), OT (By licensed OT)     PT Frequency: 5 OT Frequency: 5  Contractures      Additional Factors Info  Code Status, Allergies Code Status Info: Full code  Allergies Info: aspirin           Current Medications (12/29/2016):  This is the current hospital active medication list Current Facility-Administered Medications  Medication Dose Route Frequency Provider Last Rate Last Dose  . acetaminophen (TYLENOL) tablet 650 mg  650 mg Oral Q6H PRN Nicholes Stairs, MD   650 mg at 12/29/16 0300   Or   . acetaminophen (TYLENOL) suppository 650 mg  650 mg Rectal Q6H PRN Nicholes Stairs, MD      . buPROPion (WELLBUTRIN XL) 24 hr tablet 150 mg  150 mg Oral Daily Nicholes Stairs, MD   150 mg at 12/29/16 0955  . chlorhexidine (HIBICLENS) 4 % liquid 4 application  60 mL Topical Once Nicholes Stairs, MD      . doxazosin (CARDURA) tablet 4 mg  4 mg Oral QPM Nicholes Stairs, MD   4 mg at 12/28/16 1737  . ferrous sulfate tablet 325 mg  325 mg Oral BID Nicholes Stairs, MD   325 mg at 12/29/16 0955  . finasteride (PROSCAR) tablet 5 mg  5 mg Oral Daily Nicholes Stairs, MD   5 mg at 12/29/16 0955  . fluticasone (FLONASE) 50 MCG/ACT nasal spray 1 spray  1 spray Each Nare BID Nicholes Stairs, MD   1 spray at 12/29/16 1004  . furosemide (LASIX) tablet 40 mg  40 mg Oral BID Nicholes Stairs, MD   40 mg at 12/29/16 4650  . heparin ADULT infusion 100 units/mL (25000 units/229mL sodium chloride 0.45%)  1,350 Units/hr Intravenous Continuous Wynell Balloon, RPH 13.5 mL/hr at 12/28/16 1634 1,350 Units/hr at 12/28/16 1634  . HYDROcodone-acetaminophen (NORCO/VICODIN) 5-325 MG per tablet 1-2 tablet  1-2 tablet Oral Q4H PRN Nicholes Stairs, MD   1 tablet at 12/29/16 0753  . insulin aspart (novoLOG) injection 0-9 Units  0-9 Units Subcutaneous Q4H Etta Quill, DO   3 Units at 12/29/16 1134  . insulin glargine (LANTUS) injection 12 Units  12 Units Subcutaneous Daily Etta Quill, DO   12 Units at 12/29/16 0955  . loratadine (CLARITIN) tablet 10 mg  10 mg Oral Daily Nicholes Stairs, MD   10 mg at 12/29/16 0955  . methocarbamol (ROBAXIN) tablet 500 mg  500 mg Oral Q6H PRN Nicholes Stairs, MD       Or  . methocarbamol (ROBAXIN) 500 mg in dextrose 5 % 50 mL IVPB  500 mg Intravenous Q6H PRN Nicholes Stairs, MD      . metoprolol succinate (TOPROL-XL) 24 hr tablet 50 mg  50 mg Oral BID Nicholes Stairs, MD   50 mg at 12/29/16 0955  . morphine 4  MG/ML injection 1 mg  1 mg Intravenous Q2H PRN Nicholes Stairs, MD      . ondansetron [facility]) tablet 4 mg  4 mg Oral Q6H PRN Nicholes Stairs, MD       Or  . ondansetron [facility]) injection 4 mg  4 mg Intravenous Q6H PRN Nicholes Stairs, MD      . potassium chloride SA (K-DUR,KLOR-CON) CR tablet 20 mEq  20 mEq Oral Daily Nicholes Stairs, MD   20 mEq at 12/29/16 0954  . senna-docusate (Senokot-S) tablet 1 tablet  1 tablet Oral QHS Nicholes Stairs, MD   1 tablet at 12/28/16 2100  . simvastatin (ZOCOR)  tablet 20 mg  20 mg Oral QPM Nicholes Stairs, MD   20 mg at 12/28/16 1737  . umeclidinium-vilanterol (ANORO ELLIPTA) 62.5-25 MCG/INH 1 puff  1 puff Inhalation Daily Nicholes Stairs, MD   1 puff at 12/29/16 8337     Discharge Medications: Please see discharge summary for a list of discharge medications.  Relevant Imaging Results:  Relevant Lab Results:   Additional [place], LCSW

## 2016-12-29 NOTE — Progress Notes (Signed)
ANTICOAGULATION CONSULT NOTE - Follow Up Consult  Pharmacy Consult:  Heparin Indication: atrial fibrillation  Allergies  Allergen Reactions  . Aspirin Anaphylaxis, Shortness Of Breath and Rash    "broke out in white welts; red blotches neck and face; windpipe closing; ~ 1962"    Patient Measurements: Height: 5\' 11"  (180.3 cm) Weight: 179 lb 14.3 oz (81.6 kg) IBW/kg (Calculated) : 75.3 Heparin Dosing Weight: 81 kg  Vital Signs: Temp: 98.8 F (37.1 C) (11/04 1300) Temp Source: Oral (11/04 1300) BP: 139/51 (11/04 1300) Pulse Rate: 62 (11/04 1300)  Labs: Recent Labs    12/26/16 2249 12/27/16 1918 12/28/16 0453 12/28/16 1221 12/28/16 1520 12/29/16 0031 12/29/16 0449 12/29/16 0820  HGB 7.1* 9.4* 10.0*  --   --   --  9.2*  --   HCT 21.5* 28.7* 30.4*  --   --   --  27.9*  --   PLT 109*  --  119*  --   --   --  120*  --   LABPROT 18.0*  --  15.3*  --   --   --   --  15.7*  INR 1.50  --  1.22  --   --   --   --  1.26  HEPARINUNFRC  --   --   --   --  0.18* 0.35  --  0.32  CREATININE 1.79*  --   --  1.73*  --   --  1.53*  --     Estimated Creatinine Clearance: 32.8 mL/min (A) (by C-G formula based on SCr of 1.53 mg/dL (H)).     Assessment: 58 YOM with history of Afib on Coumadin PTA.  Currently on IV heparin bridge for surgery on Tuesday.  Heparin level is therapeutic and toward the low end of goal.  No bleeding reported.  Goal of Therapy:  Heparin level 0.3-0.7 units/ml Monitor platelets by anticoagulation protocol: Yes    Plan:  Increase heparin gtt slightly to 1400 units/hr Daily heparin level and CBC F/u resume Coumadin post surgery, additional KCL supplementation   Amaad Byers D. Mina Marble, PharmD, BCPS Pager:  [phone] 12/29/2016, 1:51 PM

## 2016-12-29 NOTE — Progress Notes (Signed)
Subjective: L proximal humerus fx. Pending surgery tuesday Patient reports pain as mild.    Objective: Vital signs in last 24 hours: Temp:  [98.8 F (37.1 C)-99.5 F (37.5 C)] 98.8 F (37.1 C) (11/04 0402) Pulse Rate:  [70-84] 84 (11/04 0402) Resp:  [20] 20 (11/03 1900) BP: (132-142)/(37-63) 132/47 (11/04 0402) SpO2:  [93 %-94 %] 94 % (11/04 0402)  Intake/Output from previous day: 11/03 0701 - 11/04 0700 In: 1210 [P.O.:1210] Out: 2400 [Urine:2400] Intake/Output this shift: Total I/O In: 240 [P.O.:240] Out: -   Recent Labs    12/26/16 2249 12/27/16 1918 12/28/16 0453 12/29/16 0449  HGB 7.1* 9.4* 10.0* 9.2*   Recent Labs    12/28/16 0453 12/29/16 0449  WBC 11.5* 11.0*  RBC 3.24* 2.95*  HCT 30.4* 27.9*  PLT 119* 120*   Recent Labs    12/28/16 1221 12/29/16 0449  NA 137 134*  K 2.9* 2.9*  CL 101 96*  CO2 29 29  BUN 32* 30*  CREATININE 1.73* 1.53*  GLUCOSE 264* 210*  CALCIUM 8.4* 8.0*   Recent Labs    12/28/16 0453 12/29/16 0820  INR 1.22 1.26    Neurologically intact ABD soft Neurovascular intact Sensation intact distally Intact pulses distally Dorsiflexion/Plantar flexion intact No cellulitis present Compartment soft Extensive ecchymosis LUE, swelling  Assessment/Plan:   Procedure(s) (LRB): LEFT REVERSE SHOULDER ARTHROPLASTY (Left) Advance diet Up with therapy  Awaiting surgery Tuesday Ice and elevation in attempt to control edema   BISSELL, JACLYN M. 12/29/2016, 11:12 AM

## 2016-12-29 NOTE — Progress Notes (Signed)
PROGRESS NOTE    Ronald [name]  ZWC:[id number] DOB: 04/07/1922 DOA: 12/26/2016 PCP: [name], MD   Brief Narrative: Ronald [name] is a 83 y.o. male with a history of AS s/p bovine AVR, chronic diastolic CHF, CAD, COPD, Depression, hypertension, thrombocytopenia, diabetes mellitus. He presented with left shoulder pain found to have a humeral fracture. He left against recommendations and has returned for his surgery, which has unfortunately been cancelled secondary to anemia.   Assessment & Plan:   Principal Problem:   Closed fracture of left proximal humerus Active Problems:   Diabetes mellitus with complication (HCC)   Chronic atrial fibrillation (HCC)   COPD (chronic obstructive pulmonary disease) (HCC)   Chronic diastolic heart failure (HCC)   Chronic renal insufficiency, stage IV (severe) (HCC)   Anemia   Comminuted left humeral fracture Patient was previously admitted for this fracture but left against medical advice to attend to some home affairs. Plan for surgery on 11/6 -analgesics prn -orthopedic surgery recommendations  Anemia Patient is s/p 1 unit of PRBC on 12/24/16. Hemoglobin trended back down. Secondary to acute blood loss from fracture and hematoma. S/p another 2 units of PRBC with good response. Stable. -watch CBC  Aortic stenosis Status post AVR in 2012.  Chronic atrial fibrillation Rate controlled. Coumadin held secondary to anticipated surgery. -Continue metoprolol.  -Continue anticoagulation with heparin bridge. Will watch for recurrent bleeding  COPD Stable.  Chronic diastolic heart failure Euvolemic.  CKD stage 4 Stable.  Chronic thrombocytopenia Stabilized  Leukocytosis Afebrile. Likely stress related. Trending down.  Thrombocytopenia Chronic. Stable.   DVT prophylaxis: SCDs Code Status: Full code Family Communication: None at bedside Disposition Plan: Discharge pending orthopedic surgery management   Consultants:    Orthopedic surgery  Procedures:   2 units PRBC (11/2)  Antimicrobials:  None    Subjective: Left shoulder pain. No other concerns  Objective: Vitals:   12/28/16 1300 12/28/16 1900 12/29/16 0402 12/29/16 1300  BP: (!) 142/63 (!) 135/37 (!) 132/47 (!) 139/51  Pulse: 78 70 84 62  Resp: 20 20  20   Temp: 99.5 F (37.5 C) 99.5 F (37.5 C) 98.8 F (37.1 C) 98.8 F (37.1 C)  TempSrc: Oral Oral Oral Oral  SpO2: 93% 93% 94% 94%  Weight:      Height:        Intake/Output Summary (Last 24 hours) at 12/29/2016 1439 Last data filed at 12/29/2016 0900 Gross per 24 hour  Intake 970 ml  Output 2000 ml  Net -1030 ml   Filed Weights   12/26/16 1739  Weight: 81.6 kg (179 lb 14.3 oz)    Examination:  General exam: Appears calm and comfortable Respiratory system: Clear to auscultation. Respiratory effort normal. Cardiovascular system: S1 & S2 heard. Gastrointestinal system: Abdomen is nondistended, soft and nontender. Normal bowel sounds heard. Central nervous system: Alert and oriented. No focal neurological deficits. Extremities: No edema. No calf tenderness. Left shoulder deformity. Swelling of left arm Skin: Significant ecchymosis on left shoulder Psychiatry: Judgement and insight appear normal. Mood & affect appropriate.    Data Reviewed: I have personally reviewed following labs and imaging studies  CBC: Recent Labs  Lab 12/23/16 1217 12/24/16 0546 12/24/16 1659 12/26/16 2249 12/27/16 1918 12/28/16 0453 12/29/16 0449  WBC 15.2* 8.2  --  12.1*  --  11.5* 11.0*  HGB 8.0* 6.5* 8.4* 7.1* 9.4* 10.0* 9.2*  HCT 25.3* 20.5* 25.4* 21.5* 28.7* 30.4* 27.9*  MCV 93.0 92.3  --  93.5  --  93.8  94.6  PLT 104* 98*  --  109*  --  119* 601*   Basic Metabolic Panel: Recent Labs  Lab 12/23/16 1217 12/24/16 0546 12/26/16 2249 12/28/16 1221 12/29/16 0449  NA 138 138 138 137 134*  K 3.4* 3.0* 2.8* 2.9* 2.9*  CL 104 105 101 101 96*  CO2 26 25 29 29 29   GLUCOSE 257* 174*  137* 264* 210*  BUN 20 19 29* 32* 30*  CREATININE 1.52* 1.50* 1.79* 1.73* 1.53*  CALCIUM 8.6* 8.3* 8.6* 8.4* 8.0*   GFR: Estimated Creatinine Clearance: 32.8 mL/min (A) (by C-G formula based on SCr of 1.53 mg/dL (H)). Liver Function Tests: Recent Labs  Lab 12/26/16 2249  AST 22  ALT 19  ALKPHOS 61  BILITOT 1.3*  PROT 5.4*  ALBUMIN 2.7*   No results for input(s): LIPASE, AMYLASE in the last 168 hours. No results for input(s): AMMONIA in the last 168 hours. Coagulation Profile: Recent Labs  Lab 12/23/16 1217 12/24/16 0546 12/26/16 2249 12/28/16 0453 12/29/16 0820  INR 2.14 2.75 1.50 1.22 1.26   Cardiac Enzymes: No results for input(s): CKTOTAL, CKMB, CKMBINDEX, TROPONINI in the last 168 hours. BNP (last 3 results) No results for input(s): PROBNP in the last 8760 hours. HbA1C: No results for input(s): HGBA1C in the last 72 hours. CBG: Recent Labs  Lab 12/29/16 0001 12/29/16 0121 12/29/16 0401 12/29/16 0746 12/29/16 1129  GLUCAP 115* 99 189* 198* 211*   Lipid Profile: No results for input(s): CHOL, HDL, LDLCALC, TRIG, CHOLHDL, LDLDIRECT in the last 72 hours. Thyroid Function Tests: No results for input(s): TSH, T4TOTAL, FREET4, T3FREE, THYROIDAB in the last 72 hours. Anemia Panel: No results for input(s): VITAMINB12, FOLATE, FERRITIN, TIBC, IRON, RETICCTPCT in the last 72 hours. Sepsis Labs: No results for input(s): PROCALCITON, LATICACIDVEN in the last 168 hours.  Recent Results (from the past 240 hour(s))  Surgical PCR screen     Status: None   Collection Time: 12/26/16 11:42 PM  Result Value Ref Range Status   MRSA, PCR NEGATIVE NEGATIVE Final   Staphylococcus aureus NEGATIVE NEGATIVE Final    Comment: (NOTE) The Xpert SA Assay (FDA approved for NASAL specimens in patients 33 years of age and older), is one component of a comprehensive surveillance program. It is not intended to diagnose infection nor to guide or monitor treatment.           Radiology Studies: No results found.      Scheduled Meds: . buPROPion  150 mg Oral Daily  . chlorhexidine  60 mL Topical Once  . doxazosin  4 mg Oral QPM  . ferrous sulfate  325 mg Oral BID  . finasteride  5 mg Oral Daily  . fluticasone  1 spray Each Nare BID  . furosemide  40 mg Oral BID  . insulin aspart  0-9 Units Subcutaneous Q4H  . insulin glargine  12 Units Subcutaneous Daily  . loratadine  10 mg Oral Daily  . metoprolol succinate  50 mg Oral BID  . potassium chloride SA  20 mEq Oral Daily  . senna-docusate  1 tablet Oral QHS  . simvastatin  20 mg Oral QPM  . umeclidinium-vilanterol  1 puff Inhalation Daily   Continuous Infusions: . heparin    . methocarbamol (ROBAXIN)  IV       LOS: 3 days     Cordelia Poche, MD [facility] 12/29/2016, 2:39 PM Pager: [phone]  If 7PM-7AM, please contact night-coverage [email or web address] Password [facility] 12/29/2016, 2:39 PM

## 2016-12-30 LAB — BASIC METABOLIC PANEL
Anion gap: 8 (ref 5–15)
BUN: 32 mg/dL — AB (ref 6–20)
CALCIUM: 8.3 mg/dL — AB (ref 8.9–10.3)
CO2: 29 mmol/L (ref 22–32)
CREATININE: 1.55 mg/dL — AB (ref 0.61–1.24)
Chloride: 99 mmol/L — ABNORMAL LOW (ref 101–111)
GFR calc non Af Amer: 37 mL/min — ABNORMAL LOW (ref >60)
GFR, EST AFRICAN AMERICAN: 43 mL/min — AB (ref >60)
GLUCOSE: 112 mg/dL — AB (ref 65–99)
Potassium: 3.3 mmol/L — ABNORMAL LOW (ref 3.5–5.1)
Sodium: 136 mmol/L (ref 135–145)

## 2016-12-30 LAB — GLUCOSE, CAPILLARY
GLUCOSE-CAPILLARY: 144 mg/dL — AB (ref 65–99)
GLUCOSE-CAPILLARY: 271 mg/dL — AB (ref 65–99)
Glucose-Capillary: 122 mg/dL — ABNORMAL HIGH (ref 65–99)
Glucose-Capillary: 273 mg/dL — ABNORMAL HIGH (ref 65–99)
Glucose-Capillary: 258 mg/dL — ABNORMAL HIGH (ref 65–99)

## 2016-12-30 LAB — CBC
HEMATOCRIT: 29.9 % — AB (ref 39.0–52.0)
Hemoglobin: 9.7 g/dL — ABNORMAL LOW (ref 13.0–17.0)
MCH: 31 pg (ref 26.0–34.0)
MCHC: 32.4 g/dL (ref 30.0–36.0)
MCV: 95.5 fL (ref 78.0–100.0)
Platelets: 139 10*3/uL — ABNORMAL LOW (ref 150–400)
RBC: 3.13 MIL/uL — ABNORMAL LOW (ref 4.22–5.81)
RDW: 18.2 % — AB (ref 11.5–15.5)
WBC: 10.6 10*3/uL — AB (ref 4.0–10.5)

## 2016-12-30 LAB — HEPARIN LEVEL (UNFRACTIONATED)
HEPARIN UNFRACTIONATED: 0.23 [IU]/mL — AB (ref 0.30–0.70)
HEPARIN UNFRACTIONATED: 0.51 [IU]/mL (ref 0.30–0.70)
HEPARIN UNFRACTIONATED: 0.57 [IU]/mL (ref 0.30–0.70)

## 2016-12-30 LAB — PROTIME-INR
INR: 1.2
PROTHROMBIN TIME: 15.1 s (ref 11.4–15.2)

## 2016-12-30 NOTE — Social Work (Signed)
CSW will f/u with patient on bed offers.  [id number]#[id number] A.  Elissa Hefty, LCSW Clinical Social Worker [phone]

## 2016-12-30 NOTE — Progress Notes (Signed)
ANTICOAGULATION CONSULT NOTE - Follow Up Consult  Pharmacy Consult:  Heparin Indication: atrial fibrillation  Allergies  Allergen Reactions  . Aspirin Anaphylaxis, Shortness Of Breath and Rash    "broke out in white welts; red blotches neck and face; windpipe closing; ~ 1962"    Patient Measurements: Height: 5\' 11"  (180.3 cm) Weight: 179 lb 14.3 oz (81.6 kg) IBW/kg (Calculated) : 75.3 Heparin Dosing Weight: 81 kg  Vital Signs: Temp: 99.5 F (37.5 C) (11/05 1426) Temp Source: Oral (11/05 1426) BP: 150/58 (11/05 1426) Pulse Rate: 77 (11/05 1426)  Labs: Recent Labs    12/28/16 0453 12/28/16 1221  12/29/16 0449 12/29/16 0820 12/30/16 0507 12/30/16 0725 12/30/16 1609  HGB 10.0*  --   --  9.2*  --  9.7*  --   --   HCT 30.4*  --   --  27.9*  --  29.9*  --   --   PLT 119*  --   --  120*  --  139*  --   --   LABPROT 15.3*  --   --   --  15.7* 15.1  --   --   INR 1.22  --   --   --  1.26 1.20  --   --   HEPARINUNFRC  --   --    < >  --  0.32 0.23*  --  0.51  CREATININE  --  1.73*  --  1.53*  --   --  1.55*  --    < > = values in this interval not displayed.    Estimated Creatinine Clearance: 32.4 mL/min (A) (by C-G formula based on SCr of 1.55 mg/dL (H)).   Assessment: 29 YOM with history of Afib on Coumadin PTA.  Currently on IV heparin bridge for surgery on Tuesday.    Heparin level this afternoon is therapeutic (HL 0.51, goal of 0.3-0.7). No bleeding or issues with the drip noted per RN report.   Goal of Therapy:  Heparin level 0.3-0.7 units/ml Monitor platelets by anticoagulation protocol: Yes  Plan:  1. Continue Heparin at 1600 units/hr (16 ml/hr) 2. Will continue to monitor for any signs/symptoms of bleeding and will follow up with heparin level in 8 hours   Thank you for allowing pharmacy to be a part of this patient's care.  Alycia Rossetti, PharmD, BCPS Clinical Pharmacist Pager: [phone] If after 3:30p, please call main pharmacy at: [phone] 12/30/2016  5:15 PM

## 2016-12-30 NOTE — Progress Notes (Signed)
Physical Therapy Treatment Patient Details Name: Ronald [name] MRN: [id number] DOB: 1923/01/28 Today's Date: 12/30/2016    History of Present Illness Patient is a 83 y/o male previously admitted on 12/23/16 s/p mechanical fall at home; x-rays revealing L proximal humerus fracture. Patient d/c home on 12/25/16 due to personal affairs. Patient returning to hospital on 12/26/16 due to severe pain. L RTSA planned for 12/27/16, however, rescheduled for 12/31/16 due to anemia concerns. Patient with a PMHx significant for DME, PVD, HTN, aortic stenosis, chronic a-fib, COPD and depression     PT Comments    Patient tolerated stand pivot transfer to recliner with mod A +2 for balance. Continue to progress as tolerated with anticipated d/c to SNF for further skilled PT services.     Follow Up Recommendations  SNF;Supervision for mobility/OOB     Equipment Recommendations  None recommended by PT    Recommendations for Other Services OT consult     Precautions / Restrictions Precautions Precautions: Fall;Shoulder Shoulder Interventions: Shoulder sling/immobilizer Precaution Comments: verbally reviewed shoulder precautions Required Braces or Orthoses: Sling Restrictions Weight Bearing Restrictions: Yes LUE Weight Bearing: Non weight bearing Other Position/Activity Restrictions: Sling    Mobility  Bed Mobility Overal bed mobility: Needs Assistance Bed Mobility: Rolling;Sidelying to Sit Rolling: Min assist Sidelying to sit: Mod assist       General bed mobility comments: cues for sequencing; use of bed rail and HOB slightly elevated; assist to roll trunk toward R side and to maintain L shoulder precautions during transitional movements; sling donned in sitting  Transfers Overall transfer level: Needs assistance   Transfers: Sit to/from Stand;Stand Pivot Transfers Sit to Stand: Mod assist Stand pivot transfers: Mod assist;+2 safety/equipment       General transfer comment: cues for  safe hand placement; assist to power up into standing and for balance while pivoting to recliner; assist to safely descent to chair   Ambulation/Gait             General Gait Details: unable   Stairs            Wheelchair Mobility    Modified Rankin (Stroke Patients Only)       Balance Overall balance assessment: Needs assistance Sitting-balance support: No upper extremity supported;Feet supported Sitting balance-Leahy Scale: Fair     Standing balance support: Single extremity supported Standing balance-Leahy Scale: Poor                              Cognition Arousal/Alertness: Awake/alert Behavior During Therapy: WFL for tasks assessed/performed Overall Cognitive Status: Within Functional Limits for tasks assessed Area of Impairment: Memory                     Memory: Decreased short-term memory                Exercises      General Comments General comments (skin integrity, edema, etc.): L UE edema and erythema      Pertinent Vitals/Pain Pain Assessment: Faces Faces Pain Scale: Hurts whole lot Pain Location: LUE Pain Descriptors / Indicators: Aching;Grimacing;Guarding Pain Intervention(s): Limited activity within patient's tolerance;Monitored during session;Repositioned    Home Living                      Prior Function            PT Goals (current goals can now be found in the care plan  section) Acute Rehab PT Goals PT Goal Formulation: With patient Time For Goal Achievement: 01/11/17 Potential to Achieve Goals: Fair Progress towards PT goals: Progressing toward goals    Frequency    Min 3X/week      PT Plan Current plan remains appropriate    Co-evaluation              AM-PAC PT "6 Clicks" Daily Activity  Outcome Measure  Difficulty turning over in bed (including adjusting bedclothes, sheets and blankets)?: Unable Difficulty moving from lying on back to sitting on the side of the bed?  : Unable Difficulty sitting down on and standing up from a chair with arms (e.g., wheelchair, bedside commode, etc,.)?: Unable Help needed moving to and from a bed to chair (including a wheelchair)?: A Lot Help needed walking in hospital room?: A Lot Help needed climbing 3-5 steps with a railing? : Total 6 Click Score: 8    End of Session Equipment Utilized During Treatment: Gait belt Activity Tolerance: Patient tolerated treatment well Patient left: with call bell/phone within reach;with family/visitor present;in chair;with chair alarm set Nurse Communication: Mobility status PT Visit Diagnosis: Unsteadiness on feet (R26.81);Other abnormalities of gait and mobility (R26.89);Muscle weakness (generalized) (M62.81);Pain Pain - Right/Left: Left Pain - part of body: Shoulder     Time: 2641-5830 PT Time Calculation (min) (ACUTE ONLY): 20 min  Charges:  $Therapeutic Activity: 8-22 mins                    G Codes:       Earney Navy, PTA Pager: [phone]     Darliss Cheney 12/30/2016, 2:49 PM

## 2016-12-30 NOTE — Progress Notes (Signed)
Subjective: L proximal humerus fx. Pending surgery tuesday Patient reports pain as mild.  Doing ok.   Objective: Vital signs in last 24 hours: Temp:  [99 F (37.2 C)-99.5 F (37.5 C)] 99.5 F (37.5 C) (11/05 1426) Pulse Rate:  [76-79] 77 (11/05 1426) Resp:  [20] 20 (11/05 1426) BP: (135-150)/(56-58) 150/58 (11/05 1426) SpO2:  [95 %-98 %] 98 % (11/05 1426)  Intake/Output from previous day: 11/04 0701 - 11/05 0700 In: 960 [P.O.:960] Out: 1201 [Urine:1200; Stool:1] Intake/Output this shift: No intake/output data recorded.  Recent Labs    12/28/16 0453 12/29/16 0449 12/30/16 0507  HGB 10.0* 9.2* 9.7*   Recent Labs    12/29/16 0449 12/30/16 0507  WBC 11.0* 10.6*  RBC 2.95* 3.13*  HCT 27.9* 29.9*  PLT 120* 139*   Recent Labs    12/29/16 0449 12/30/16 0725  NA 134* 136  K 2.9* 3.3*  CL 96* 99*  CO2 29 29  BUN 30* 32*  CREATININE 1.53* 1.55*  GLUCOSE 210* 112*  CALCIUM 8.0* 8.3*   Recent Labs    12/29/16 0820 12/30/16 0507  INR 1.26 1.20    Neurologically intact ABD soft Neurovascular intact Sensation intact distally Intact pulses distally Motor intact distally No cellulitis present Compartment soft Extensive ecchymosis LUE, swelling but prospective DP interval incision is good for surgery  Assessment/Plan:   Procedure(s) (LRB): LEFT REVERSE SHOULDER ARTHROPLASTY (Left) Advance diet Up with therapy  Awaiting surgery tomorrow Will turn off heparin gtt 6 hours preop, at 5 am, NPO tonight at [place] Ice and elevation in attempt to control edema  Appreciate medicine team for their assistance.  Nicholes Stairs 12/30/2016, 7:11 PM

## 2016-12-30 NOTE — Progress Notes (Signed)
Occupational Therapy Treatment Patient Details Name: Ronald [name] MRN: [id number] DOB: 05-25-1922 Today's Date: 12/30/2016    History of present illness Patient is a 83 y/o male previously admitted on 12/23/16 s/p mechanical fall at home; x-rays revealing L proximal humerus fracture. Patient d/c home on 12/25/16 due to personal affairs. Patient returning to hospital on 12/26/16 due to severe pain. L RTSA planned for 12/27/16, however, rescheduled for 12/31/16 due to anemia concerns. Patient with a PMHx significant for DME, PVD, HTN, aortic stenosis, chronic a-fib, COPD and depression    OT comments  Pt continues to be limited by significant pain along his L side and LUE. Pt requiring Max A to manage sling and reposition for increased support. Pt requiring Max A +2 and VCs for stand pivot transfer to EOB. Pt will required further acute OT to facilitate safe dc. Continue to recommend dc to SNF for further OT to optimize safety and occupational performance.    Follow Up Recommendations  SNF;Supervision/Assistance - 24 hour    Equipment Recommendations  Other (comment)(Defer to next venue)    Recommendations for Other Services      Precautions / Restrictions Precautions Precautions: Fall;Shoulder Type of Shoulder Precautions: Prior to most recent admission "spoke with ortho PA for clarification on ROM orders, per ortho PA, okay for gentle PROM (pendulums only) to L shoulder"; will need further clarification; patient in sling Shoulder Interventions: Shoulder sling/immobilizer Precaution Comments: verbally reviewed shoulder precautions Required Braces or Orthoses: Sling Restrictions Weight Bearing Restrictions: Yes LUE Weight Bearing: Non weight bearing Other Position/Activity Restrictions: Sling       Mobility Bed Mobility Overal bed mobility: Needs Assistance Bed Mobility: Rolling;Sidelying to Sit Rolling: Min assist Sidelying to sit: Mod assist   Sit to supine: Max assist;+2 for  physical assistance   General bed mobility comments: Max A +2 to lower trunk and manage BLEs  Transfers Overall transfer level: Needs assistance   Transfers: Sit to/from [employer] Transfers Sit to Stand: +2 physical assistance;Mod assist Stand pivot transfers: Max assist;+2 physical assistance       General transfer comment: Max A +2 to stand pivot to EOB and VCs for sequencing.     Balance Overall balance assessment: Needs assistance Sitting-balance support: No upper extremity supported;Feet supported Sitting balance-Leahy Scale: Fair     Standing balance support: Single extremity supported Standing balance-Leahy Scale: Poor                             ADL either performed or assessed with clinical judgement   ADL Overall ADL's : Needs assistance/impaired                 Upper Body Dressing : Maximal assistance;Sitting Upper Body Dressing Details (indicate cue type and reason): Max A for managing sling Lower Body Dressing: Maximal assistance Lower Body Dressing Details (indicate cue type and reason): Max A to adjust socks       Toileting - Clothing Manipulation Details (indicate cue type and reason): Pt performing urination in urinal with Min A to manage urinal. Pt compelting peri care while seated in recliner     Functional mobility during ADLs: +2 for physical assistance;Cueing for sequencing;Maximal assistance General ADL Comments: Upon arrival, pt sliding out of recliner reporting significant pain. Pt stating "I have been calling for help for two hours and no one has come." Pt requiring Max A to reposition in reclienr and the Max A +2 for stand  pivot transfer to EOB.  Pt requiring Max A to manage sling and reposition     Vision       Perception     Praxis      Cognition Arousal/Alertness: Awake/alert Behavior During Therapy: WFL for tasks assessed/performed Overall Cognitive Status: Within Functional Limits for tasks assessed Area  of Impairment: Memory                     Memory: Decreased short-term memory         General Comments: Pt requiring increased time and cues for processing        Exercises Exercises: General Upper Extremity General Exercises - Upper Extremity Wrist Flexion: AROM;Left;5 reps;Seated Wrist Extension: AROM;Left;5 reps;Seated Digit Composite Flexion: AROM;Left;10 reps;Seated Composite Extension: AROM;Left;10 reps;Seated   Shoulder Instructions       General Comments Conintued LUE edema but decreased slightly from previous visit. Pt granddaughter present throughout session    Pertinent Vitals/ Pain       Pain Assessment: Faces Faces Pain Scale: Hurts whole lot Pain Location: LUE Pain Descriptors / Indicators: Aching;Grimacing;Guarding;Sharp;Tender Pain Intervention(s): Monitored during session;Repositioned;Limited activity within patient's tolerance  Home Living                                          Prior Functioning/Environment              Frequency  Min 2X/week        Progress Toward Goals  OT Goals(current goals can now be found in the care plan section)  Progress towards OT goals: Not progressing toward goals - comment(Continues to be limited by significant pain. Feel pt will progress better post surgery planned to TUEs)  Acute Rehab OT Goals Patient Stated Goal: return home, reduce pain OT Goal Formulation: With patient Time For Goal Achievement: 01/07/17 Potential to Achieve Goals: Good ADL Goals Pt Will Perform Grooming: with set-up;with supervision;sitting Pt Will Perform Upper Body Bathing: with min assist;sitting Pt Will Perform Upper Body Dressing: with min assist;sitting Pt Will Perform Lower Body Dressing: with min assist;sit to/from stand Pt Will Transfer to Toilet: with min guard assist;stand pivot transfer;bedside commode Pt Will Perform Toileting - Clothing Manipulation and hygiene: with min assist;sit to/from  stand Pt/caregiver will Perform Home Exercise Program: Increased ROM;Increased strength;Left upper extremity;With Supervision;With written HEP provided  Plan Discharge plan remains appropriate    Co-evaluation                 AM-PAC PT "6 Clicks" Daily Activity     Outcome Measure   Help from another person eating meals?: A Little Help from another person taking care of personal grooming?: A Little Help from another person toileting, which includes using toliet, bedpan, or urinal?: A Lot Help from another person bathing (including washing, rinsing, drying)?: A Lot Help from another person to put on and taking off regular upper body clothing?: A Lot Help from another person to put on and taking off regular lower body clothing?: A Lot 6 Click Score: 14    End of Session Equipment Utilized During Treatment: Other (comment)(Sling)  OT Visit Diagnosis: Unsteadiness on feet (R26.81);History of falling (Z91.81);Muscle weakness (generalized) (M62.81);Pain Pain - Right/Left: Left Pain - part of body: Shoulder   Activity Tolerance Patient tolerated treatment well   Patient Left with call bell/phone within reach;with family/visitor present;in bed;with bed alarm set  Nurse Communication Mobility status        Time: 0404-5913 OT Time Calculation (min): 26 min  Charges: OT General Charges $OT Visit: 1 Visit OT Treatments $Self Care/Home Management : 23-37 mins  [place], OTR/L Acute Rehab Pager: [phone] Office: [place] 12/30/2016, 5:40 PM

## 2016-12-30 NOTE — Progress Notes (Signed)
ANTICOAGULATION CONSULT NOTE - Follow Up Consult  Pharmacy Consult for heparin Indication: atrial fibrillation  Labs: Recent Labs    12/28/16 0453 12/28/16 1221  12/29/16 0031 12/29/16 0449 12/29/16 0820 12/30/16 0507  HGB 10.0*  --   --   --  9.2*  --  9.7*  HCT 30.4*  --   --   --  27.9*  --  29.9*  PLT 119*  --   --   --  120*  --  139*  LABPROT 15.3*  --   --   --   --  15.7* 15.1  INR 1.22  --   --   --   --  1.26 1.20  HEPARINUNFRC  --   --    < > 0.35  --  0.32 0.23*  CREATININE  --  1.73*  --   --  1.53*  --   --    < > = values in this interval not displayed.    Assessment: 83yo male now below goal on heparin after two levels at low end of goal.  Goal of Therapy:  Heparin level 0.3-0.7 units/ml   Plan:  Will increase heparin gtt by ~2 units/kg/hr to 1600 units/hr and check level in [place], PharmD, BCPS  12/30/2016,7:19 AM

## 2016-12-30 NOTE — Progress Notes (Signed)
ANTICOAGULATION CONSULT NOTE - Follow Up Consult  Pharmacy Consult for heparin Indication: atrial fibrillation  Labs: Recent Labs    12/28/16 0453 12/28/16 1221  12/29/16 0449 12/29/16 0820 12/30/16 0507 12/30/16 0725 12/30/16 1609 12/30/16 2251  HGB 10.0*  --   --  9.2*  --  9.7*  --   --   --   HCT 30.4*  --   --  27.9*  --  29.9*  --   --   --   PLT 119*  --   --  120*  --  139*  --   --   --   LABPROT 15.3*  --   --   --  15.7* 15.1  --   --   --   INR 1.22  --   --   --  1.26 1.20  --   --   --   HEPARINUNFRC  --   --    < >  --  0.32 0.23*  --  0.51 0.57  CREATININE  --  1.73*  --  1.53*  --   --  1.55*  --   --    < > = values in this interval not displayed.    Assessment/Plan:  83yo male remains therapeutic on heparin. Will continue gtt at current rate and monitor daily level.   Wynona Neat, PharmD, BCPS  12/30/2016,11:45 PM

## 2016-12-30 NOTE — Progress Notes (Signed)
PROGRESS NOTE    Ronald [name]  MEQ:[id number] DOB: 05-28-1922 DOA: 12/26/2016 PCP: [name], MD   Brief Narrative: ULMER DEGEN is a 83 y.o. male with a history of AS s/p bovine AVR, chronic diastolic CHF, CAD, COPD, Depression, hypertension, thrombocytopenia, diabetes mellitus. He presented with left shoulder pain found to have a humeral fracture. He left against recommendations and has returned for his surgery, which has unfortunately been cancelled secondary to anemia. Surgery planned for 11/6.   Assessment & Plan:   Principal Problem:   Closed fracture of left proximal humerus Active Problems:   Diabetes mellitus with complication (HCC)   Essential hypertension   Thrombocytopenia (HCC)   Chronic atrial fibrillation (HCC)   COPD (chronic obstructive pulmonary disease) (HCC)   Chronic diastolic heart failure (HCC)   Chronic renal insufficiency, stage IV (severe) (HCC)   Anemia   Comminuted left humeral fracture Patient was previously admitted for this fracture but left against medical advice to attend to some home affairs. Plan for surgery on 11/6 -analgesics prn -orthopedic surgery recommendations  Anemia Patient is s/p 1 unit of PRBC on 12/24/16. Hemoglobin trended back down. Secondary to acute blood loss from fracture and hematoma. S/p another 2 units of PRBC with good response. Stable. -trend CBC  Aortic stenosis Status post AVR in 2012. -hold Coumadin -continue heparin bridge  Chronic atrial fibrillation Rate controlled. Coumadin held secondary to anticipated surgery. -Continue metoprolol.  -Continue anticoagulation with heparin bridge. Will watch for recurrent bleeding  COPD Stable.  Chronic diastolic heart failure Euvolemic.  CKD stage 4 Stable.  Chronic thrombocytopenia Stabilized  Leukocytosis Afebrile. Likely stress related. Trending down.  Thrombocytopenia Chronic. Stable.  Hypokalemia Improving. -continue potassium 20 meq  daily -repeat BMP   DVT prophylaxis: SCDs Code Status: Full code Family Communication: None at bedside Disposition Plan: Discharge pending orthopedic surgery management   Consultants:   Orthopedic surgery  Procedures:   2 units PRBC (11/2)  Antimicrobials:  None    Subjective: Some left elbow discomfort  Objective: Vitals:   12/29/16 1300 12/29/16 1645 12/29/16 2100 12/30/16 0500  BP: (!) 139/51 (!) 135/52 (!) 135/56 (!) 135/58  Pulse: 62  76 79  Resp: 20  20 20   Temp: 98.8 F (37.1 C)  99 F (37.2 C) 99 F (37.2 C)  TempSrc: Oral  Oral Oral  SpO2: 94%  95% 96%  Weight:      Height:        Intake/Output Summary (Last 24 hours) at 12/30/2016 0633 Last data filed at 12/29/2016 1900 Gross per 24 hour  Intake 720 ml  Output 1201 ml  Net -481 ml   Filed Weights   12/26/16 1739  Weight: 81.6 kg (179 lb 14.3 oz)    Examination:  General exam: Appears calm and comfortable Respiratory system: Clear to auscultation. Respiratory effort normal. Cardiovascular system: S1 & S2 heard. Gastrointestinal system: Abdomen is nondistended, soft and nontender. Normal bowel sounds heard. Central nervous system: Alert and oriented. No focal neurological deficits. Extremities: No edema. No calf tenderness. Left shoulder deformity. Swelling of left arm with some bullae on forearm Skin: Significant ecchymosis on left shoulder Psychiatry: Judgement and insight appear normal. Mood & affect appropriate.    Data Reviewed: I have personally reviewed following labs and imaging studies  CBC: Recent Labs  Lab 12/23/16 1217 12/24/16 0546 12/24/16 1659 12/26/16 2249 12/27/16 1918 12/28/16 0453 12/29/16 0449  WBC 15.2* 8.2  --  12.1*  --  11.5* 11.0*  HGB 8.0*  6.5* 8.4* 7.1* 9.4* 10.0* 9.2*  HCT 25.3* 20.5* 25.4* 21.5* 28.7* 30.4* 27.9*  MCV 93.0 92.3  --  93.5  --  93.8 94.6  PLT 104* 98*  --  109*  --  119* 952*   Basic Metabolic Panel: Recent Labs  Lab 12/23/16 1217  12/24/16 0546 12/26/16 2249 12/28/16 1221 12/29/16 0449  NA 138 138 138 137 134*  K 3.4* 3.0* 2.8* 2.9* 2.9*  CL 104 105 101 101 96*  CO2 26 25 29 29 29   GLUCOSE 257* 174* 137* 264* 210*  BUN 20 19 29* 32* 30*  CREATININE 1.52* 1.50* 1.79* 1.73* 1.53*  CALCIUM 8.6* 8.3* 8.6* 8.4* 8.0*   GFR: Estimated Creatinine Clearance: 32.8 mL/min (A) (by C-G formula based on SCr of 1.53 mg/dL (H)). Liver Function Tests: Recent Labs  Lab 12/26/16 2249  AST 22  ALT 19  ALKPHOS 61  BILITOT 1.3*  PROT 5.4*  ALBUMIN 2.7*   No results for input(s): LIPASE, AMYLASE in the last 168 hours. No results for input(s): AMMONIA in the last 168 hours. Coagulation Profile: Recent Labs  Lab 12/23/16 1217 12/24/16 0546 12/26/16 2249 12/28/16 0453 12/29/16 0820  INR 2.14 2.75 1.50 1.22 1.26   Cardiac Enzymes: No results for input(s): CKTOTAL, CKMB, CKMBINDEX, TROPONINI in the last 168 hours. BNP (last 3 results) No results for input(s): PROBNP in the last 8760 hours. HbA1C: No results for input(s): HGBA1C in the last 72 hours. CBG: Recent Labs  Lab 12/29/16 1129 12/29/16 1555 12/29/16 2033 12/30/16 0000 12/30/16 0435  GLUCAP 211* 269* 350* 256* 144*   Lipid Profile: No results for input(s): CHOL, HDL, LDLCALC, TRIG, CHOLHDL, LDLDIRECT in the last 72 hours. Thyroid Function Tests: No results for input(s): TSH, T4TOTAL, FREET4, T3FREE, THYROIDAB in the last 72 hours. Anemia Panel: No results for input(s): VITAMINB12, FOLATE, FERRITIN, TIBC, IRON, RETICCTPCT in the last 72 hours. Sepsis Labs: No results for input(s): PROCALCITON, LATICACIDVEN in the last 168 hours.  Recent Results (from the past 240 hour(s))  Surgical PCR screen     Status: None   Collection Time: 12/26/16 11:42 PM  Result Value Ref Range Status   MRSA, PCR NEGATIVE NEGATIVE Final   Staphylococcus aureus NEGATIVE NEGATIVE Final    Comment: (NOTE) The Xpert SA Assay (FDA approved for NASAL specimens in patients  49 years of age and older), is one component of a comprehensive surveillance program. It is not intended to diagnose infection nor to guide or monitor treatment.          Radiology Studies: No results found.      Scheduled Meds: . buPROPion  150 mg Oral Daily  . chlorhexidine  60 mL Topical Once  . doxazosin  4 mg Oral QPM  . ferrous sulfate  325 mg Oral BID  . finasteride  5 mg Oral Daily  . fluticasone  1 spray Each Nare BID  . furosemide  40 mg Oral BID  . insulin aspart  0-9 Units Subcutaneous Q4H  . insulin glargine  12 Units Subcutaneous Daily  . loratadine  10 mg Oral Daily  . metoprolol succinate  50 mg Oral BID  . potassium chloride SA  20 mEq Oral Daily  . senna-docusate  1 tablet Oral QHS  . simvastatin  20 mg Oral QPM  . umeclidinium-vilanterol  1 puff Inhalation Daily   Continuous Infusions: . heparin 1,400 Units/hr (12/29/16 2036)  . methocarbamol (ROBAXIN)  IV       LOS: 4 days  Cordelia Poche, MD [facility] 12/30/2016, 6:33 AM Pager: [phone]  If 7PM-7AM, please contact night-coverage [email or web address] Password [facility] 12/30/2016, 6:33 AM

## 2016-12-30 NOTE — Social Work (Signed)
CSW spoke with patient on bed offers. Pt indicated that he would like to wait until after surgery to decide if he would need SNF.  CSW provided him list to keep and will f/u at that time. Pt confirmed that has been to [facility] in the past and is unsure if he would like to return.  CSW will continue to follow.  Elissa Hefty, LCSW Clinical Social Worker [phone]

## 2016-12-31 ENCOUNTER — Encounter (HOSPITAL_COMMUNITY): Payer: Self-pay | Admitting: Orthopedic Surgery

## 2016-12-31 ENCOUNTER — Inpatient Hospital Stay (HOSPITAL_COMMUNITY): Payer: PPO | Admitting: Emergency Medicine

## 2016-12-31 ENCOUNTER — Inpatient Hospital Stay (HOSPITAL_COMMUNITY): Payer: PPO

## 2016-12-31 ENCOUNTER — Other Ambulatory Visit: Payer: Self-pay

## 2016-12-31 ENCOUNTER — Encounter (HOSPITAL_COMMUNITY)
Admission: RE | Disposition: A | Discharge status: Skilled Nursing Facility | Payer: Self-pay | Source: Ambulatory Visit | Attending: Orthopedic Surgery

## 2016-12-31 HISTORY — PX: REVERSE SHOULDER ARTHROPLASTY: SHX5054

## 2016-12-31 LAB — CBC
HEMATOCRIT: 30 % — AB (ref 39.0–52.0)
HEMOGLOBIN: 9.6 g/dL — AB (ref 13.0–17.0)
MCH: 31.1 pg (ref 26.0–34.0)
MCHC: 32 g/dL (ref 30.0–36.0)
MCV: 97.1 fL (ref 78.0–100.0)
Platelets: 144 10*3/uL — ABNORMAL LOW (ref 150–400)
RBC: 3.09 MIL/uL — AB (ref 4.22–5.81)
RDW: 18.5 % — ABNORMAL HIGH (ref 11.5–15.5)
WBC: 10.3 10*3/uL (ref 4.0–10.5)

## 2016-12-31 LAB — BASIC METABOLIC PANEL
ANION GAP: 9 (ref 5–15)
BUN: 31 mg/dL — AB (ref 6–20)
CHLORIDE: 100 mmol/L — AB (ref 101–111)
CO2: 30 mmol/L (ref 22–32)
Calcium: 8.6 mg/dL — ABNORMAL LOW (ref 8.9–10.3)
Creatinine, Ser: 1.47 mg/dL — ABNORMAL HIGH (ref 0.61–1.24)
GFR calc Af Amer: 46 mL/min — ABNORMAL LOW (ref >60)
GFR, EST NON AFRICAN AMERICAN: 40 mL/min — AB (ref >60)
GLUCOSE: 141 mg/dL — AB (ref 65–99)
POTASSIUM: 3.7 mmol/L (ref 3.5–5.1)
SODIUM: 139 mmol/L (ref 135–145)

## 2016-12-31 LAB — HEMOGLOBIN AND HEMATOCRIT, BLOOD
HCT: 29.2 % — ABNORMAL LOW (ref 39.0–52.0)
HEMOGLOBIN: 9.3 g/dL — AB (ref 13.0–17.0)

## 2016-12-31 LAB — PROTIME-INR
INR: 1.24
PROTHROMBIN TIME: 15.5 s — AB (ref 11.4–15.2)

## 2016-12-31 LAB — GLUCOSE, CAPILLARY
GLUCOSE-CAPILLARY: 166 mg/dL — AB (ref 65–99)
GLUCOSE-CAPILLARY: 158 mg/dL — AB (ref 65–99)
Glucose-Capillary: 147 mg/dL — ABNORMAL HIGH (ref 65–99)
Glucose-Capillary: 165 mg/dL — ABNORMAL HIGH (ref 65–99)
Glucose-Capillary: 236 mg/dL — ABNORMAL HIGH (ref 65–99)

## 2016-12-31 LAB — PREPARE RBC (CROSSMATCH)

## 2016-12-31 LAB — HEPARIN LEVEL (UNFRACTIONATED): HEPARIN UNFRACTIONATED: 0.5 [IU]/mL (ref 0.30–0.70)

## 2016-12-31 SURGERY — ARTHROPLASTY, SHOULDER, TOTAL, REVERSE
Anesthesia: General | Site: Shoulder | Laterality: Left

## 2016-12-31 MED ORDER — FENTANYL CITRATE (PF) 100 MCG/2ML IJ SOLN
INTRAMUSCULAR | Status: AC
  Filled 2016-12-31: qty 2

## 2016-12-31 MED ORDER — ACETAMINOPHEN 650 MG RE SUPP: 650.0000 mg | RECTAL | Status: DC | PRN

## 2016-12-31 MED ORDER — CEFAZOLIN SODIUM-DEXTROSE 1-4 GM/50ML-% IV SOLN
1.0000 g | Freq: Four times a day (QID) | INTRAVENOUS | Status: AC
  Administered 2016-12-31 – 2017-01-01 (×3): 1 g via INTRAVENOUS
  Filled 2016-12-31 (×3): qty 50

## 2016-12-31 MED ORDER — MIDAZOLAM HCL 2 MG/2ML IJ SOLN
INTRAMUSCULAR | Status: AC
  Filled 2016-12-31: qty 2

## 2016-12-31 MED ORDER — MIDAZOLAM HCL 2 MG/2ML IJ SOLN
1.0000 mg | Freq: Once | INTRAMUSCULAR | Status: AC
  Administered 2016-12-31: 1 mg via INTRAVENOUS

## 2016-12-31 MED ORDER — ALBUMIN HUMAN 5 % IV SOLN
INTRAVENOUS | Status: DC | PRN
  Administered 2016-12-31: 15:00:00 via INTRAVENOUS

## 2016-12-31 MED ORDER — ACETAMINOPHEN 325 MG PO TABS: 650.0000 mg | ORAL_TABLET | ORAL | Status: DC | PRN

## 2016-12-31 MED ORDER — METOCLOPRAMIDE HCL 5 MG/ML IJ SOLN: 5.0000 mg | Freq: Three times a day (TID) | INTRAMUSCULAR | Status: DC | PRN

## 2016-12-31 MED ORDER — ONDANSETRON HCL 4 MG/2ML IJ SOLN: 4.0000 mg | Freq: Four times a day (QID) | INTRAMUSCULAR | Status: DC | PRN

## 2016-12-31 MED ORDER — BUPIVACAINE-EPINEPHRINE (PF) 0.5% -1:200000 IJ SOLN
INTRAMUSCULAR | Status: DC | PRN
  Administered 2016-12-31: 20 mL via PERINEURAL

## 2016-12-31 MED ORDER — LACTATED RINGERS IV SOLN
INTRAVENOUS | Status: DC
  Administered 2016-12-31 (×3): via INTRAVENOUS

## 2016-12-31 MED ORDER — CEFAZOLIN SODIUM-DEXTROSE 2-3 GM-%(50ML) IV SOLR
INTRAVENOUS | Status: DC | PRN
  Administered 2016-12-31: 2 g via INTRAVENOUS

## 2016-12-31 MED ORDER — PHENYLEPHRINE HCL 10 MG/ML IJ SOLN
INTRAMUSCULAR | Status: DC | PRN
  Administered 2016-12-31: 40 ug via INTRAVENOUS
  Administered 2016-12-31 (×2): 120 ug via INTRAVENOUS

## 2016-12-31 MED ORDER — BUPIVACAINE-EPINEPHRINE (PF) 0.25% -1:200000 IJ SOLN
INTRAMUSCULAR | Status: AC
  Filled 2016-12-31: qty 30

## 2016-12-31 MED ORDER — FENTANYL CITRATE (PF) 250 MCG/5ML IJ SOLN
INTRAMUSCULAR | Status: AC
  Filled 2016-12-31: qty 5

## 2016-12-31 MED ORDER — HEPARIN (PORCINE) IN NACL 100-0.45 UNIT/ML-% IJ SOLN
1600.0000 [IU]/h | INTRAMUSCULAR | Status: DC
  Administered 2017-01-01: 1600 [IU]/h via INTRAVENOUS
  Filled 2016-12-31: qty 250

## 2016-12-31 MED ORDER — FENTANYL CITRATE (PF) 100 MCG/2ML IJ SOLN
50.0000 ug | Freq: Once | INTRAMUSCULAR | Status: AC
  Administered 2016-12-31: 50 ug via INTRAVENOUS

## 2016-12-31 MED ORDER — PROPOFOL 10 MG/ML IV BOLUS
INTRAVENOUS | Status: DC | PRN
  Administered 2016-12-31: 110 mg via INTRAVENOUS

## 2016-12-31 MED ORDER — ONDANSETRON HCL 4 MG/2ML IJ SOLN
INTRAMUSCULAR | Status: DC | PRN
Start: 2016-12-31 — End: 2016-12-31
  Administered 2016-12-31: 4 mg via INTRAVENOUS

## 2016-12-31 MED ORDER — LIDOCAINE HCL (CARDIAC) 20 MG/ML IV SOLN
INTRAVENOUS | Status: DC | PRN
  Administered 2016-12-31: 60 mg via INTRAVENOUS

## 2016-12-31 MED ORDER — 0.9 % SODIUM CHLORIDE (POUR BTL) OPTIME
TOPICAL | Status: DC | PRN
  Administered 2016-12-31: 1000 mL

## 2016-12-31 MED ORDER — SODIUM CHLORIDE 0.9 % IV SOLN: Freq: Once | INTRAVENOUS | Status: AC

## 2016-12-31 MED ORDER — CEFAZOLIN SODIUM-DEXTROSE 2-4 GM/100ML-% IV SOLN
INTRAVENOUS | Status: AC
  Filled 2016-12-31: qty 100

## 2016-12-31 MED ORDER — FENTANYL CITRATE (PF) 100 MCG/2ML IJ SOLN
INTRAMUSCULAR | Status: DC | PRN
  Administered 2016-12-31: 50 ug via INTRAVENOUS

## 2016-12-31 MED ORDER — ONDANSETRON HCL 4 MG PO TABS: 4.0000 mg | ORAL_TABLET | Freq: Four times a day (QID) | ORAL | Status: DC | PRN

## 2016-12-31 MED ORDER — SUGAMMADEX SODIUM 200 MG/2ML IV SOLN
INTRAVENOUS | Status: DC | PRN
  Administered 2016-12-31: 200 mg via INTRAVENOUS

## 2016-12-31 MED ORDER — DEXTROSE 5 % IV SOLN
INTRAVENOUS | Status: DC | PRN
  Administered 2016-12-31: 40 ug/min via INTRAVENOUS

## 2016-12-31 MED ORDER — METOCLOPRAMIDE HCL 5 MG PO TABS: 5.0000 mg | ORAL_TABLET | Freq: Three times a day (TID) | ORAL | Status: DC | PRN

## 2016-12-31 MED ORDER — BUPIVACAINE-EPINEPHRINE 0.25% -1:200000 IJ SOLN
INTRAMUSCULAR | Status: DC | PRN
  Administered 2016-12-31: 30 mL

## 2016-12-31 MED ORDER — ROCURONIUM BROMIDE 100 MG/10ML IV SOLN
INTRAVENOUS | Status: DC | PRN
  Administered 2016-12-31: 40 mg via INTRAVENOUS

## 2016-12-31 SURGICAL SUPPLY — 67 items
ADH SKN CLS LQ APL DERMABOND (GAUZE/BANDAGES/DRESSINGS) ×1
BIT DRILL 5/64X5 DISP (BIT) ×3 IMPLANT
BIT DRILL F/CENTRAL SCRW 3.2 (BIT)
BIT DRILL F/CENTRAL SCRW 3.2MM (BIT) IMPLANT
BIT DRILL TWIST 2.7 (BIT) IMPLANT
BIT DRILL TWIST 2.7MM (BIT)
BLADE SAG 18X100X1.27 (BLADE) ×3 IMPLANT
CAPT SHLDR REVTOTAL 2 ×2 IMPLANT
CLOSURE WOUND 1/2 X4 (GAUZE/BANDAGES/DRESSINGS) ×1
COVER SURGICAL LIGHT HANDLE (MISCELLANEOUS) ×3 IMPLANT
DERMABOND ADHESIVE PROPEN (GAUZE/BANDAGES/DRESSINGS) ×2
DERMABOND ADVANCED .7 DNX6 (GAUZE/BANDAGES/DRESSINGS) IMPLANT
DRAPE IMP U-DRAPE 54X76 (DRAPES) ×6 IMPLANT
DRAPE INCISE IOBAN 66X45 STRL (DRAPES) ×3 IMPLANT
DRAPE ORTHO SPLIT 77X108 STRL (DRAPES) ×6
DRAPE SURG ORHT 6 SPLT 77X108 (DRAPES) ×2 IMPLANT
DRAPE U-SHAPE 47X51 STRL (DRAPES) ×3 IMPLANT
DRILL BIT F/CENTRAL SCRW 3.2MM (BIT)
DRSG ADAPTIC 3X8 NADH LF (GAUZE/BANDAGES/DRESSINGS) ×3 IMPLANT
DRSG AQUACEL AG ADV 3.5X10 (GAUZE/BANDAGES/DRESSINGS) ×2 IMPLANT
DRSG PAD ABDOMINAL 8X10 ST (GAUZE/BANDAGES/DRESSINGS) ×3 IMPLANT
DURAPREP 26ML APPLICATOR (WOUND CARE) ×3 IMPLANT
ELECT BLADE 4.0 EZ CLEAN MEGAD (MISCELLANEOUS) ×3
ELECT REM PT RETURN 9FT ADLT (ELECTROSURGICAL) ×3
ELECTRODE BLDE 4.0 EZ CLN MEGD (MISCELLANEOUS) ×1 IMPLANT
ELECTRODE REM PT RTRN 9FT ADLT (ELECTROSURGICAL) ×1 IMPLANT
GAUZE SPONGE 4X4 12PLY STRL (GAUZE/BANDAGES/DRESSINGS) ×3 IMPLANT
GAUZE XEROFORM 5X9 LF (GAUZE/BANDAGES/DRESSINGS) ×2 IMPLANT
GLOVE BIO SURGEON STRL SZ7.5 (GLOVE) ×3 IMPLANT
GLOVE BIOGEL PI IND STRL 8 (GLOVE) ×1 IMPLANT
GLOVE BIOGEL PI INDICATOR 8 (GLOVE) ×2
GOWN STRL REUS W/ TWL LRG LVL3 (GOWN DISPOSABLE) ×1 IMPLANT
GOWN STRL REUS W/ TWL XL LVL3 (GOWN DISPOSABLE) ×1 IMPLANT
GOWN STRL REUS W/TWL LRG LVL3 (GOWN DISPOSABLE) ×3
GOWN STRL REUS W/TWL XL LVL3 (GOWN DISPOSABLE) ×3
KIT BASIN OR (CUSTOM PROCEDURE TRAY) ×3 IMPLANT
KIT BEACH CHAIR TRIMANO (MISCELLANEOUS) ×3 IMPLANT
KIT ROOM TURNOVER OR (KITS) ×3 IMPLANT
MANIFOLD NEPTUNE II (INSTRUMENTS) ×3 IMPLANT
NDL 1/2 CIR MAYO (NEEDLE) ×1 IMPLANT
NDL HYPO 25GX1X1/2 BEV (NEEDLE) ×1 IMPLANT
NEEDLE 1/2 CIR MAYO (NEEDLE) ×3 IMPLANT
NEEDLE HYPO 25GX1X1/2 BEV (NEEDLE) ×3 IMPLANT
NS IRRIG 1000ML POUR BTL (IV SOLUTION) ×3 IMPLANT
PACK SHOULDER (CUSTOM PROCEDURE TRAY) ×3 IMPLANT
PAD ARMBOARD 7.5X6 YLW CONV (MISCELLANEOUS) ×6 IMPLANT
PIN THREADED REVERSE (PIN) IMPLANT
SLING ARM FOAM STRAP LRG (SOFTGOODS) IMPLANT
SLING ARM FOAM STRAP MED (SOFTGOODS) IMPLANT
SPONGE LAP 18X18 X RAY DECT (DISPOSABLE) IMPLANT
SPONGE LAP 4X18 X RAY DECT (DISPOSABLE) ×3 IMPLANT
STRIP CLOSURE SKIN 1/2X4 (GAUZE/BANDAGES/DRESSINGS) ×2 IMPLANT
SUCTION FRAZIER HANDLE 10FR (MISCELLANEOUS) ×2
SUCTION TUBE FRAZIER 10FR DISP (MISCELLANEOUS) ×1 IMPLANT
SUT FIBERWIRE #2 38 T-5 BLUE (SUTURE) ×6
SUT MNCRL AB 4-0 PS2 18 (SUTURE) ×3 IMPLANT
SUT VIC AB 1 CT1 27 (SUTURE)
SUT VIC AB 1 CT1 27XBRD ANBCTR (SUTURE) IMPLANT
SUT VIC AB 2-0 CT1 27 (SUTURE) ×3
SUT VIC AB 2-0 CT1 TAPERPNT 27 (SUTURE) ×1 IMPLANT
SUTURE FIBERWR #2 38 T-5 BLUE (SUTURE) ×1 IMPLANT
SYR CONTROL 10ML LL (SYRINGE) ×3 IMPLANT
TOWEL OR 17X24 6PK STRL BLUE (TOWEL DISPOSABLE) ×3 IMPLANT
TOWEL OR 17X26 10 PK STRL BLUE (TOWEL DISPOSABLE) ×3 IMPLANT
TOWER CARTRIDGE SMART MIX (DISPOSABLE) IMPLANT
WATER STERILE IRR 1000ML POUR (IV SOLUTION) ×3 IMPLANT
YANKAUER SUCT BULB TIP NO VENT (SUCTIONS) ×3 IMPLANT

## 2016-12-31 NOTE — Progress Notes (Signed)
ANTICOAGULATION CONSULT NOTE - Follow Up Consult  Pharmacy Consult for Heparin Indication: atrial fibrillation  Allergies  Allergen Reactions  . Aspirin Anaphylaxis, Shortness Of Breath and Rash    "broke out in white welts; red blotches neck and face; windpipe closing; ~ 1962"    Patient Measurements: Height: 5' 10.98" (180.3 cm) Weight: 179 lb (81.2 kg) IBW/kg (Calculated) : 75.26 Heparin Dosing Weight: 81.2 kg  Vital Signs: Temp: 97.4 F (36.3 C) (11/06 1846) Temp Source: Oral (11/06 1846) BP: 146/60 (11/06 1846) Pulse Rate: 86 (11/06 1846)  Labs: Recent Labs    12/29/16 0449 12/29/16 0820 12/30/16 0507 12/30/16 0725 12/30/16 1609 12/30/16 2251 12/31/16 0609 12/31/16 1100 12/31/16 1848  HGB 9.2*  --  9.7*  --   --   --  9.6*  --  9.3*  HCT 27.9*  --  29.9*  --   --   --  30.0*  --  29.2*  PLT 120*  --  139*  --   --   --  144*  --   --   LABPROT  --  15.7* 15.1  --   --   --  15.5*  --   --   INR  --  1.26 1.20  --   --   --  1.24  --   --   HEPARINUNFRC  --  0.32 0.23*  --  0.51 0.57 0.50  --   --   CREATININE 1.53*  --   --  1.55*  --   --   --  1.47*  --     Estimated Creatinine Clearance: 34.1 mL/min (A) (by C-G formula based on SCr of 1.47 mg/dL (H)).  Assessment:  83 yr old male on Coumadin prior to admission for atrial fibrillation.  Coumadin held and transitioned to IV heparin for surgery today.  Heparin level was therapeutic (0.57) on 1600 units/hr this morning.  Drip held for surgery, and to resume 12 hours post-op.  Out of OR ~5pm.  Noted 1 unit PRBCs transfused in OR.  Goal of Therapy:  Heparin level 0.3-0.7 units/ml Monitor platelets by anticoagulation protocol: Yes   Plan:   Resume heparin drip at 5am on 01/01/17 at 1600 units/hr.  Heparin level ~ 8hrs after drip resumes.  Daily heparin level, PT/INR and CBC.  Will follow up for resuming Coumadin.  Arty Baumgartner, [place] Pager: [phone] 12/31/2016,8:33 PM

## 2016-12-31 NOTE — Progress Notes (Signed)
Heparin drip turned off at 0700 per verbal orders from Dr Francee Piccolo.

## 2016-12-31 NOTE — Care Management Important Message (Signed)
Important Message  Patient Details  Name: Ronald [name] MRN: [id number] Date of Birth: 1922/11/16   Medicare Important Message Given:  Yes    [name] 12/31/2016, 8:35 AM

## 2016-12-31 NOTE — Anesthesia Procedure Notes (Signed)
Procedure Name: Intubation Date/Time: 12/31/2016 2:11 PM Performed by: [place] Lesure T, CRNA Pre-anesthesia Checklist: Patient identified, Emergency Drugs available, Suction available and Patient being monitored Patient Re-evaluated:Patient Re-evaluated prior to induction Oxygen Delivery Method: Circle system utilized Preoxygenation: Pre-oxygenation with 100% oxygen Induction Type: IV induction Ventilation: Mask ventilation without difficulty Laryngoscope Size: Miller and 3 Grade View: Grade I Tube type: Oral Tube size: 7.5 mm Number of attempts: 1 Airway Equipment and Method: Patient positioned with wedge pillow and Stylet Placement Confirmation: ETT inserted through vocal cords under direct vision,  positive ETCO2 and breath sounds checked- equal and bilateral Secured at: 23 cm Tube secured with: Tape Dental Injury: Teeth and Oropharynx as per pre-operative assessment

## 2016-12-31 NOTE — Anesthesia Procedure Notes (Signed)
Anesthesia Regional Block: Interscalene brachial plexus block   Pre-Anesthetic Checklist: ,, timeout performed, Correct Patient, Correct Site, Correct Laterality, Correct Procedure, Correct Position, site marked, Risks and benefits discussed, pre-op evaluation,  At surgeon's request and post-op pain management  Laterality: Left  Prep: chloraprep       Needles:   Needle Type: Echogenic Needle     Needle Length: 9cm  Needle Gauge: 21     Additional Needles:   Procedures:,,,, ultrasound used (permanent image in chart),,,,  Narrative:  Start time: 12/31/2016 1:04 PM End time: 12/31/2016 1:10 PM Injection made incrementally with aspirations every 5 mL. Anesthesiologist: Lyndle Herrlich, MD

## 2016-12-31 NOTE — Brief Op Note (Signed)
12/26/2016 - 12/31/2016  4:16 PM  PATIENT:  Ronald [name]  83 y.o. male  PRE-OPERATIVE DIAGNOSIS:  left humerus fracture  POST-OPERATIVE DIAGNOSIS:  left humerus fracture  PROCEDURE:  Procedure(s): LEFT REVERSE SHOULDER ARTHROPLASTY (Left)  SURGEON:  Surgeon(s) and Role:    * Nicholes Stairs, MD - Primary  PHYSICIAN ASSISTANT:  none  ASSISTANTS: April Green, RNFA  ANESTHESIA:   general  EBL:  350 mL   BLOOD ADMINISTERED:1 unit CC PRBC  DRAINS: none   LOCAL MEDICATIONS USED:  MARCAINE     SPECIMEN:  No Specimen  DISPOSITION OF SPECIMEN:  N/A  COUNTS:  YES  TOURNIQUET:  * No tourniquets in log *  DICTATION: .Note written in EPIC  PLAN OF CARE: Admit to inpatient   PATIENT DISPOSITION:  PACU - hemodynamically stable.   Delay start of Pharmacological VTE agent (>24hrs) due to surgical blood loss or risk of bleeding: not applicable

## 2016-12-31 NOTE — Anesthesia Postprocedure Evaluation (Signed)
Anesthesia Post Note  Patient: Ronald [name]  Procedure(s) Performed: LEFT REVERSE SHOULDER ARTHROPLASTY (Left Shoulder)     Patient location during evaluation: PACU Anesthesia Type: General and Regional Level of consciousness: awake, awake and alert and oriented Pain management: pain level controlled Vital Signs Assessment: post-procedure vital signs reviewed and stable Respiratory status: spontaneous breathing, nonlabored ventilation and respiratory function stable Cardiovascular status: blood pressure returned to baseline Postop Assessment: no headache Anesthetic complications: no    Last Vitals:  Vitals:   12/31/16 1815 12/31/16 1846  BP: (!) 134/50 (!) 146/60  Pulse: 67 86  Resp: 17 18  Temp:  (!) 36.3 C  SpO2: 99% 100%    Last Pain:  Vitals:   12/31/16 1846  TempSrc: Oral  PainSc: Asleep                 Imir Brumbach COKER

## 2016-12-31 NOTE — Progress Notes (Signed)
Inpatient Diabetes Program Recommendations  AACE/ADA: New Consensus Statement on Inpatient Glycemic Control (2015)  Target Ranges:  Prepandial:   less than 140 mg/dL      Peak postprandial:   less than 180 mg/dL (1-2 hours)      Critically ill patients:  140 - 180 mg/dL   Results for Ronald [name], Ronald [name] (MRN [id number]) as of 12/31/2016 10:32  Ref. Range 12/30/2016 07:59 12/30/2016 12:51 12/30/2016 16:56 12/30/2016 20:55 12/31/2016 00:20 12/31/2016 04:38  Glucose-Capillary Latest Ref Range: 65 - 99 mg/dL 122 (H)  Novolog 1 unit  Lantus 12 units 273 (H)  Novolog 5 units 271 (H)  Novolog 5 units 258 (H)  Novolog 5 units 236 (H)  Novolog 3 units 166 (H)  Novolog 2 units    Review of Glycemic Control  Diabetes history: DM2 Outpatient Diabetes medications: Lantus 10 units daily, Glyburide 10 mg QAM, Glyburide 2.5 mg QPM Current orders for Inpatient glycemic control: Lantus 12 units daily, Novolog 0-9 units Q4H  Inpatient Diabetes Program Recommendations: Insulin - Basal: Please consider increasing Lantus to 14 units daily. Insulin - Meal Coverage: Noted patient is NPO at this time. When diet resumed, please consider ordering Novolog 3 units TID with meals for meal coverage if patient eats at least 50% of meals.  Thanks, Barnie Alderman, RN, MSN, CDE Diabetes Coordinator Inpatient Diabetes Program [phone] (Team Pager from 8am to 5pm)

## 2016-12-31 NOTE — Anesthesia Preprocedure Evaluation (Addendum)
Anesthesia Evaluation  Patient identified by MRN, date of birth, ID band Patient awake    Reviewed: Allergy & Precautions, H&P , Patient's Chart, lab work & pertinent test results, reviewed documented beta blocker date and time   Airway Mallampati: II  TM Distance: >3 FB Neck ROM: full    Dental no notable dental hx.    Pulmonary Current Smoker,    Pulmonary exam normal breath sounds clear to auscultation       Cardiovascular Exercise Tolerance: Good hypertension, Atrial Fibrillation + Valvular Problems/Murmurs AI  Rhythm:regular Rate:Normal     Neuro/Psych    GI/Hepatic   Endo/Other  diabetes, Type 2  Renal/GU      Musculoskeletal   Abdominal   Peds  Hematology   Anesthesia Other Findings 65% EF; AS Afib 9.6 Hb; plts 144k COPD   Reproductive/Obstetrics                           Anesthesia Physical Anesthesia Plan  ASA: III  Anesthesia Plan:    Post-op Pain Management:  Regional for Post-op pain   Induction: Intravenous  PONV Risk Score and Plan: 0  Airway Management Planned: Oral ETT  Additional Equipment:   Intra-op Plan:   Post-operative Plan: Extubation in OR  Informed Consent: I have reviewed the patients History and Physical, chart, labs and discussed the procedure including the risks, benefits and alternatives for the proposed anesthesia with the patient or authorized representative who has indicated his/her understanding and acceptance.   Dental Advisory Given  Plan Discussed with: CRNA and Surgeon  Anesthesia Plan Comments: (  )       Anesthesia Quick Evaluation

## 2016-12-31 NOTE — H&P (Signed)
H&P update  The surgical history has been reviewed and remains accurate without interval change.  The patient was re-examined and patient's physiologic condition has not changed significantly in the last 30 days. The condition still exists that makes this procedure necessary. The treatment plan remains the same, without new options for care.  No new pharmacological allergies or types of therapy has been initiated that would change the plan or the appropriateness of the plan.  The patient and/or family understand the potential benefits and risks.  Jason P. Stann Mainland, MD 12/31/2016 8:16 AM

## 2016-12-31 NOTE — Op Note (Signed)
Date: 12/31/2016  PRE-OPERATIVE DIAGNOSIS: Left proximal humerus fracture  POST-OPERATIVE DIAGNOSIS: Same  PROCEDURE: 1. REVERSE SHOULDER ARTHROPLASTY   SURGEON: Nicholes Stairs, MD  ASSISTANT: April Green, RNFA  ANESTHESIA: General with a block  ESTIMATED BLOOD LOSS: See anesthesia record  PREOPERATIVE INDICATIONS: Ronald [name] is a Right-handed 83 year old male who sustained a Left proximal humerus fracture following a fall.Due to the comminution and displaced nature of the fracture and head splitting components we discussed operative management.  We discussed moving forward with reverse shoulder arthroplasty for his injury to allow earlier weight bearing on a walker and/or cane as needed and lower risk of AVN, non union or progression of shoulder arthritis following the fracture. Thus we elected to proceed with reverse shoulder arthroplasty for the plan. The risks benefits and alternatives were discussed with the patient preoperatively including but not limited to the risks of infection,Dislocation, bleeding, nerve injury, cardiopulmonary complications, the need for revision surgery, dislocation, brachial plexus palsy, incomplete relief of pain, among others, and the patient was willing to proceed. The patient Did provided informed consent.  Of note he was on chronic anticoagulation and was supratherapeutic following the fall.  We did also note to have anemia following the fall.  He was adequately resuscitated and had his INR drift down to normal range before surgery.  This did cause a delay in surgical management of about 5 days.  OPERATIVE IMPLANTS: Biomet size 41mm humeral fracturestem press fitted  with a 57mm with +67mm humeralliner and a 41 mm +3 glenosphere with a 25 mm(mini) baseplate and 4, 2.35TDDUKGU screws and one central 6.5 mm nonlocking screw.  OPERATIVE FINDINGS:  There was a large anterior deltoid defect noted where the humeral shaft had  penetrated through the musculature.  This resulted in a large subcutaneous hematoma that was evacuated.  The defect in the anterior deltoid was not repairable.  Otherwise the greater tuberosity cuff tissue was reasonable and was able to be approximated back to the humeral stem and affixed with bone graft.  The humeral stem was able to be press fitted with good rotational stability.  There were no immediate postoperative complications.  OPERATIVE PROCEDURE: The patient was brought to the operating room and placed in the supine position. General anesthesia was administered. IV antibiotics were given. Time out was performed. The upper extremity was prepped and draped in usual sterile fashion. The patient was in a beachchair position. Deltopectoral approach was carried out. After dissection through skin and subcutaneous fat, the fracture was medially identified as it had buttonholed through the anterior deltoid.  There was a large void in the anterior deltoid muscle which are from the humeral shaft piece.  This was approximately 3 cm in diameter.  The cephalic vein had been obliterated and was not visible in the deltopectoral interval.  The interval was entered proximally at the coracoid process and opened up distally to the level of the humeral shaft.  Fracture hematoma was evacuated bluntly and with irrigation..  The fracture was identified and working through the fracture in the biceps groove the lesser and greater tuberosities were freed up and tagged with #2 Fiber wire sutures. The humeral head was fragmented and removed from the wound.    I then performed circumferential releases of the humerus. I then moved to sizing the humerus. There was no calcar bone loss and this was used to reference the height of the stem, as was the upper border of the pectoralis major tendon. there was noted to be  posterior proximal bone loss, however this did not affect the press fit of the fracture stem. The canal was  reamed and found to fit best with a 7mm fracture stem.  We next turned to the glenoid. Deep retractors were placed, and I resected the labrum as well as the residual long head of biceps, and then placed a guidepin into the center position on the glenoid, with slight inferior declination. I then reamed over the guidepin, and this created a small metaphyseal cancellus blush inferiorly, removing just the cartilage to the subchondral bone superiorly. The base plate was selected and impacted place, and then I secured it centrally with a nonlocking screw, and I had excellent purchase both inferiorly and superiorly. I placed a short locking screws on anterior aspect,  And posterior aspect.  I then turned my attention to the glenosphere, and impacted this into place, placing slight inferior offset.   The glenoid sphere was completely seated, and had engagement of the [facility] taper. I then turned my attention back to the humerus.   The 38mm fracture stem was seated to the appropriate height to allow approximate 5.6 cm from the top of the Pectoralis major tendon to the top of the glenosphere. The stem was placed in 30 degrees of retroversion.  Once the stem was impacted into place we trialed poly liners. Using the +5 mm standard humeral metaglen, The shoulder had excellent motion, and was stable without impingement. The final poly was impacted and again showed good motion and stability. Next,I irrigated the wounds copiously.   The greater tuberosity was brought back to the humeral stem suture holes and secured with bone graft from the humeral head as augment. The lesser tuberosity was unfortunately not able to be brought back to the stem due to lateralization from the +3 glenosphere, but the deltoid was noted to have excellent tension. The axillary nerve was palpated at the end of implanting, and found to be in continuity and not under undo tension.  I then irrigated the shoulder copiously once  more, repaired the deltopectoral interval with Vicryl followed by subcutaneous monocryl and then subcuticular monocrylwith sterile gauze for the skin. The patient was awakened and returned back in stable and satisfactory condition. There no complications and hetolerated the procedure well. All counts were correct. The patient awakened from general anesthesia with no complications and transferred to PACU in stable condition.  Due to his labile anemia preoperatively and necessity for immediate postoperative anticoagulation we did transfuse 1 unit of packed red blood cells intraoperatively.  Postoperative Plan: ANANIAS KOLANDER will remain in his sling until his regional block has worn off, but is ok to remove it for use with activities of daily living. The sling will be worn for sleep for 6 weeks.  Shoulder therapy will begin 6 weeks postoperatively. he will return to the floor for postoperative medical care.  He will be initiated back on his treatment strength anticoagulation starting 12 hours postoperatively.  He will likely need transition from the inpatient setting to a skilled nursing facility.

## 2016-12-31 NOTE — Progress Notes (Signed)
PROGRESS NOTE    Ronald [name]  TFT:[id number] DOB: Apr 30, 1922 DOA: 12/26/2016 PCP: [name], MD   Brief Narrative: Ronald [name] is a 83 y.o. male with a history of AS s/p bovine AVR, chronic diastolic CHF, CAD, COPD, Depression, hypertension, thrombocytopenia, diabetes mellitus. He presented with left shoulder pain found to have a humeral fracture. He left against recommendations and has returned for his surgery, which has unfortunately been cancelled secondary to anemia. Surgery planned for 11/6.   Assessment & Plan:   Principal Problem:   Closed fracture of left proximal humerus Active Problems:   Diabetes mellitus with complication (HCC)   Essential hypertension   Thrombocytopenia (HCC)   Chronic atrial fibrillation (HCC)   COPD (chronic obstructive pulmonary disease) (HCC)   Chronic diastolic heart failure (HCC)   Chronic renal insufficiency, stage IV (severe) (HCC)   Anemia   Comminuted left humeral fracture Patient was previously admitted for this fracture but left against medical advice to attend to some home affairs. Plan for surgery today -analgesics prn -orthopedic surgery recommendations  Anemia Patient is s/p 1 unit of PRBC on 12/24/16. Hemoglobin trended back down. Secondary to acute blood loss from fracture and hematoma. S/p another 2 units of PRBC with good response. Stable. -trend CBC  Aortic stenosis Status post AVR in 2012. -hold Coumadin until after surgery -continue heparin bridge  Chronic atrial fibrillation Rate controlled. Coumadin held secondary to anticipated surgery. -Continue metoprolol.  -Continue anticoagulation with heparin bridge. Will watch for recurrent bleeding  COPD Stable.  Chronic diastolic heart failure Euvolemic.  CKD stage 4 Stable.  Chronic thrombocytopenia Improving. Acutely down secondary to hemorrhage.  Leukocytosis Afebrile. Likely stress related. Trending down.  Thrombocytopenia Chronic.  Stable.  Hypokalemia Improving with supplementation. -Repeat BMP today.   DVT prophylaxis: SCDs Code Status: Full code Family Communication: None at bedside Disposition Plan: Discharge pending orthopedic surgery management   Consultants:   Orthopedic surgery  Procedures:   2 units PRBC (11/2)  Antimicrobials:  None    Subjective: No concerns today  Objective: Vitals:   12/30/16 0818 12/30/16 1426 12/30/16 2140 12/31/16 0444  BP:  (!) 150/58 (!) 164/55 140/61  Pulse:  77 80 74  Resp:  20 18 17   Temp:  99.5 F (37.5 C) 99 F (37.2 C) 99.3 F (37.4 C)  TempSrc:  Oral Oral Oral  SpO2: 96% 98% 99% 98%  Weight:      Height:        Intake/Output Summary (Last 24 hours) at 12/31/2016 1035 Last data filed at 12/31/2016 0444 Gross per 24 hour  Intake 410 ml  Output 1250 ml  Net -840 ml   Filed Weights   12/26/16 1739  Weight: 81.6 kg (179 lb 14.3 oz)    Examination:  General exam: Appears calm and comfortable Respiratory system: Clear to auscultation. Respiratory effort normal. Cardiovascular system: S1 & S2 heard. Gastrointestinal system: Abdomen is nondistended, soft and nontender. Normal bowel sounds heard. Central nervous system: Alert and oriented. No focal neurological deficits. Extremities: No edema. No calf tenderness. Left shoulder deformity. Swelling of left arm  Skin: Significant ecchymosis on left shoulder. bullae on forearm Psychiatry: Judgement and insight appear normal. Mood & affect appropriate.    Data Reviewed: I have personally reviewed following labs and imaging studies  CBC: Recent Labs  Lab 12/26/16 2249 12/27/16 1918 12/28/16 0453 12/29/16 0449 12/30/16 0507 12/31/16 0609  WBC 12.1*  --  11.5* 11.0* 10.6* 10.3  HGB 7.1* 9.4* 10.0* 9.2* 9.7*  9.6*  HCT 21.5* 28.7* 30.4* 27.9* 29.9* 30.0*  MCV 93.5  --  93.8 94.6 95.5 97.1  PLT 109*  --  119* 120* 139* 287*   Basic Metabolic Panel: Recent Labs  Lab 12/26/16 2249  12/28/16 1221 12/29/16 0449 12/30/16 0725  NA 138 137 134* 136  K 2.8* 2.9* 2.9* 3.3*  CL 101 101 96* 99*  CO2 29 29 29 29   GLUCOSE 137* 264* 210* 112*  BUN 29* 32* 30* 32*  CREATININE 1.79* 1.73* 1.53* 1.55*  CALCIUM 8.6* 8.4* 8.0* 8.3*   GFR: Estimated Creatinine Clearance: 32.4 mL/min (A) (by C-G formula based on SCr of 1.55 mg/dL (H)). Liver Function Tests: Recent Labs  Lab 12/26/16 2249  AST 22  ALT 19  ALKPHOS 61  BILITOT 1.3*  PROT 5.4*  ALBUMIN 2.7*   No results for input(s): LIPASE, AMYLASE in the last 168 hours. No results for input(s): AMMONIA in the last 168 hours. Coagulation Profile: Recent Labs  Lab 12/26/16 2249 12/28/16 0453 12/29/16 0820 12/30/16 0507 12/31/16 0609  INR 1.50 1.22 1.26 1.20 1.24   Cardiac Enzymes: No results for input(s): CKTOTAL, CKMB, CKMBINDEX, TROPONINI in the last 168 hours. BNP (last 3 results) No results for input(s): PROBNP in the last 8760 hours. HbA1C: No results for input(s): HGBA1C in the last 72 hours. CBG: Recent Labs  Lab 12/30/16 1251 12/30/16 1656 12/30/16 2055 12/31/16 0020 12/31/16 0438  GLUCAP 273* 271* 258* 236* 166*   Lipid Profile: No results for input(s): CHOL, HDL, LDLCALC, TRIG, CHOLHDL, LDLDIRECT in the last 72 hours. Thyroid Function Tests: No results for input(s): TSH, T4TOTAL, FREET4, T3FREE, THYROIDAB in the last 72 hours. Anemia Panel: No results for input(s): VITAMINB12, FOLATE, FERRITIN, TIBC, IRON, RETICCTPCT in the last 72 hours. Sepsis Labs: No results for input(s): PROCALCITON, LATICACIDVEN in the last 168 hours.  Recent Results (from the past 240 hour(s))  Surgical PCR screen     Status: None   Collection Time: 12/26/16 11:42 PM  Result Value Ref Range Status   MRSA, PCR NEGATIVE NEGATIVE Final   Staphylococcus aureus NEGATIVE NEGATIVE Final    Comment: (NOTE) The Xpert SA Assay (FDA approved for NASAL specimens in patients 42 years of age and older), is one component of a  comprehensive surveillance program. It is not intended to diagnose infection nor to guide or monitor treatment.          Radiology Studies: No results found.      Scheduled Meds: . buPROPion  150 mg Oral Daily  . chlorhexidine  60 mL Topical Once  . doxazosin  4 mg Oral QPM  . ferrous sulfate  325 mg Oral BID  . finasteride  5 mg Oral Daily  . fluticasone  1 spray Each Nare BID  . furosemide  40 mg Oral BID  . insulin aspart  0-9 Units Subcutaneous Q4H  . insulin glargine  12 Units Subcutaneous Daily  . loratadine  10 mg Oral Daily  . metoprolol succinate  50 mg Oral BID  . potassium chloride SA  20 mEq Oral Daily  . senna-docusate  1 tablet Oral QHS  . simvastatin  20 mg Oral QPM  . umeclidinium-vilanterol  1 puff Inhalation Daily   Continuous Infusions: . heparin 1,600 Units/hr (12/30/16 1546)  . methocarbamol (ROBAXIN)  IV       LOS: 5 days     Cordelia Poche, MD [facility] 12/31/2016, 10:35 AM Pager: [phone]  If 7PM-7AM, please contact night-coverage [email or web address]  Password [email or web address] 12/31/2016, 10:35 AM

## 2016-12-31 NOTE — Transfer of Care (Signed)
Immediate Anesthesia Transfer of Care Note  Patient: Ronald [name] [facility]  Procedure(s) Performed: LEFT REVERSE SHOULDER ARTHROPLASTY (Left Shoulder)  Patient Location: PACU  Anesthesia Type:GA combined with regional for post-op pain  Level of Consciousness: awake and alert   Airway & Oxygen Therapy: Patient Spontanous Breathing and Patient connected to nasal cannula oxygen  Post-op Assessment: Report given to RN and Post -op Vital signs reviewed and stable  Post vital signs: Reviewed and stable  Last Vitals:  Vitals:   12/31/16 1330 12/31/16 1334  BP: (!) 136/46 123/68  Pulse: 80 88  Resp: 16 18  Temp:    SpO2: 100% 100%    Last Pain:  Vitals:   12/31/16 1158  TempSrc: Oral  PainSc:          Complications: No apparent anesthesia complications

## 2016-12-31 NOTE — Progress Notes (Signed)
ANTICOAGULATION CONSULT NOTE - Follow Up Consult  Pharmacy Consult:  Heparin Indication: atrial fibrillation  Allergies  Allergen Reactions  . Aspirin Anaphylaxis, Shortness Of Breath and Rash    "broke out in white welts; red blotches neck and face; windpipe closing; ~ 1962"    Patient Measurements: Height: 5\' 11"  (180.3 cm) Weight: 179 lb 14.3 oz (81.6 kg) IBW/kg (Calculated) : 75.3 Heparin Dosing Weight: 81 kg  Assessment: 89 YOM with history of Afib on Coumadin 2mg  daily exc for 3mg  on Tues/Thurs PTA. Now transitioned to heparin b/c need hip surgery Tues. INR down to 1.2, heparin level is now therapeutic at 0.57. Hgb 9.7, plts low but stable at 139.  Goal of Therapy:  Heparin level 0.3-0.7 units/ml Monitor platelets by anticoagulation protocol: Yes  Plan:  Heparin gtt held for Ortho surgery today F/u resume heparin / Coumadin s/p surgery  Elenor Quinones, PharmD, BCPS Clinical Pharmacist Pager [phone] 12/31/2016 8:32 AM

## 2017-01-01 ENCOUNTER — Encounter (HOSPITAL_COMMUNITY): Payer: Self-pay | Admitting: Orthopedic Surgery

## 2017-01-01 DIAGNOSIS — I10 Essential (primary) hypertension: Secondary | ICD-10-CM

## 2017-01-01 DIAGNOSIS — N184 Chronic kidney disease, stage 4 (severe): Secondary | ICD-10-CM

## 2017-01-01 DIAGNOSIS — S42202A Unspecified fracture of upper end of left humerus, initial encounter for closed fracture: Secondary | ICD-10-CM

## 2017-01-01 DIAGNOSIS — J449 Chronic obstructive pulmonary disease, unspecified: Secondary | ICD-10-CM

## 2017-01-01 DIAGNOSIS — I482 Chronic atrial fibrillation: Secondary | ICD-10-CM

## 2017-01-01 DIAGNOSIS — Z96612 Presence of left artificial shoulder joint: Secondary | ICD-10-CM

## 2017-01-01 DIAGNOSIS — E118 Type 2 diabetes mellitus with unspecified complications: Secondary | ICD-10-CM

## 2017-01-01 DIAGNOSIS — D649 Anemia, unspecified: Secondary | ICD-10-CM

## 2017-01-01 DIAGNOSIS — I5032 Chronic diastolic (congestive) heart failure: Secondary | ICD-10-CM

## 2017-01-01 DIAGNOSIS — D696 Thrombocytopenia, unspecified: Secondary | ICD-10-CM

## 2017-01-01 LAB — CBC
HCT: 25.5 % — ABNORMAL LOW (ref 39.0–52.0)
HEMOGLOBIN: 8.2 g/dL — AB (ref 13.0–17.0)
MCH: 31.3 pg (ref 26.0–34.0)
MCHC: 32.2 g/dL (ref 30.0–36.0)
MCV: 97.3 fL (ref 78.0–100.0)
Platelets: 136 10*3/uL — ABNORMAL LOW (ref 150–400)
RBC: 2.62 MIL/uL — AB (ref 4.22–5.81)
RDW: 18 % — ABNORMAL HIGH (ref 11.5–15.5)
WBC: 13.1 10*3/uL — AB (ref 4.0–10.5)

## 2017-01-01 LAB — BASIC METABOLIC PANEL
ANION GAP: 10 (ref 5–15)
BUN: 33 mg/dL — ABNORMAL HIGH (ref 6–20)
CO2: 28 mmol/L (ref 22–32)
Calcium: 8.2 mg/dL — ABNORMAL LOW (ref 8.9–10.3)
Chloride: 100 mmol/L — ABNORMAL LOW (ref 101–111)
Creatinine, Ser: 1.55 mg/dL — ABNORMAL HIGH (ref 0.61–1.24)
GFR calc Af Amer: 43 mL/min — ABNORMAL LOW (ref >60)
GFR calc non Af Amer: 37 mL/min — ABNORMAL LOW (ref >60)
Glucose, Bld: 149 mg/dL — ABNORMAL HIGH (ref 65–99)
Potassium: 4 mmol/L (ref 3.5–5.1)
SODIUM: 138 mmol/L (ref 135–145)

## 2017-01-01 LAB — GLUCOSE, CAPILLARY
GLUCOSE-CAPILLARY: 141 mg/dL — AB (ref 65–99)
GLUCOSE-CAPILLARY: 172 mg/dL — AB (ref 65–99)
GLUCOSE-CAPILLARY: 219 mg/dL — AB (ref 65–99)
GLUCOSE-CAPILLARY: 393 mg/dL — AB (ref 65–99)
Glucose-Capillary: 297 mg/dL — ABNORMAL HIGH (ref 65–99)
Glucose-Capillary: 310 mg/dL — ABNORMAL HIGH (ref 65–99)

## 2017-01-01 LAB — PROTIME-INR
INR: 1.29
PROTHROMBIN TIME: 15.9 s — AB (ref 11.4–15.2)

## 2017-01-01 LAB — HEPARIN LEVEL (UNFRACTIONATED): HEPARIN UNFRACTIONATED: 0.29 [IU]/mL — AB (ref 0.30–0.70)

## 2017-01-01 MED ORDER — METOCLOPRAMIDE HCL 5 MG/ML IJ SOLN: 5.0000 mg | Freq: Three times a day (TID) | INTRAMUSCULAR | Status: DC | PRN

## 2017-01-01 MED ORDER — HEPARIN (PORCINE) IN NACL 100-0.45 UNIT/ML-% IJ SOLN
1700.0000 [IU]/h | INTRAMUSCULAR | Status: DC
  Administered 2017-01-02: 1650 [IU]/h via INTRAVENOUS
  Filled 2017-01-01 (×7): qty 250

## 2017-01-01 MED ORDER — METOCLOPRAMIDE HCL 5 MG PO TABS: 5.0000 mg | ORAL_TABLET | Freq: Three times a day (TID) | ORAL | Status: DC | PRN

## 2017-01-01 MED ORDER — HYDROMORPHONE HCL 1 MG/ML IJ SOLN: 0.5000 mg | INTRAMUSCULAR | Status: DC | PRN

## 2017-01-01 NOTE — Progress Notes (Signed)
Orthopedic Tech Progress Note Patient Details:  Ronald [name] 09-13-1922 [id number]  Ortho Devices Type of Ortho Device: Shoulder abduction pillow Ortho Device/Splint Location: lue Ortho Device/Splint Interventions: Application   Shakerria Parran 01/01/2017, 3:38 PM

## 2017-01-01 NOTE — Progress Notes (Signed)
   Subjective:  Patient reports pain as moderate.  Pain in the shoulder and left forearm.  No numbness or tingling.  Denies SOB/CP/N/V.  Objective:   VITALS:   Vitals:   12/31/16 1846 12/31/16 2044 01/01/17 0435 01/01/17 1500  BP: (!) 146/60 (!) 144/61 (!) 124/44 (!) 121/45  Pulse: 86 77 78 98  Resp: 18 16 16 18   Temp: (!) 97.4 F (36.3 C) 98.6 F (37 C) (!) 100.9 F (38.3 C) 99 F (37.2 C)  TempSrc: Oral Oral Axillary Axillary  SpO2: 100% 100% 98% 98%  Weight:      Height:       LUE- Sling in place with abduction pillow. SILT ax/med/rad/uln Motor intact with ax/mc/med/ain/rad/pin/uln Hand is very edematous with fracture blisters from elbow to hand, though WWp with good cap refill and no pain with AROM or PROM Bandage with dime sized drainage at base, otherwise c/d/i.   Lab Results  Component Value Date   WBC 13.1 (H) 01/01/2017   HGB 8.2 (L) 01/01/2017   HCT 25.5 (L) 01/01/2017   MCV 97.3 01/01/2017   PLT 136 (L) 01/01/2017   BMET    Component Value Date/Time   NA 138 01/01/2017 0407   K 4.0 01/01/2017 0407   CL 100 (L) 01/01/2017 0407   CO2 28 01/01/2017 0407   GLUCOSE 149 (H) 01/01/2017 0407   BUN 33 (H) 01/01/2017 0407   CREATININE 1.55 (H) 01/01/2017 0407   CREATININE 1.50 (H) 10/18/2015 1140   CALCIUM 8.2 (L) 01/01/2017 0407   GFRNONAA 37 (L) 01/01/2017 0407   GFRAA 43 (L) 01/01/2017 0407     Assessment/Plan: 1 Day Post-Op   Principal Problem:   Closed fracture of left proximal humerus Active Problems:   Diabetes mellitus with complication (HCC)   Essential hypertension   Thrombocytopenia (HCC)   Chronic atrial fibrillation (HCC)   COPD (chronic obstructive pulmonary disease) (HCC)   Chronic diastolic heart failure (HCC)   Chronic renal insufficiency, stage IV (severe) (HCC)   Anemia  -PT/OT - NWB to LUE and maintain abduction sling - ok to use left arm/hand for ADLS as able with arm by side   We appreciate medical team for assistance  with his multiple chronic and acute on chronic morbidities and their management  DISPO- likely to SNF   Nicholes Stairs 01/01/2017, 5:05 PM   Geralynn Rile, MD [phone]

## 2017-01-01 NOTE — Progress Notes (Signed)
PROGRESS NOTE    RACIEL CAFFREY  YTK:[id number] DOB: May 21, 1922 DOA: 12/26/2016 PCP: [name], MD   Brief Narrative:  Ronald [name] is a 83 y.o. male with a history of AS s/p bovine AVR, chronic diastolic CHF, CAD, COPD, Depression, Hypertension, Thrombocytopenia, Diabetes Mellitus. He presented with left shoulder pain found to have a humeral fracture. He left against recommendations and has returned for his surgery, which was unfortunately been cancelled secondary to anemia and rescheduled 12/31/16. Patient is POD 1 and complaining of uncontrolled Pain.   Assessment & Plan:   Principal Problem:   Closed fracture of left proximal humerus Active Problems:   Diabetes mellitus with complication (HCC)   Essential hypertension   Thrombocytopenia (HCC)   Chronic atrial fibrillation (HCC)   COPD (chronic obstructive pulmonary disease) (HCC)   Chronic diastolic heart failure (HCC)   Chronic renal insufficiency, stage IV (severe) (HCC)   Anemia  Comminuted Left Humeral Fracture s/p Reverse Should Arthoplasty POD 1 -Patient was previously admitted for this fracture but left against medical advice to attend to some home affairs. Underwent Surgery 11/6 -Analgesics prn; Added Dilaudid 0.5 mg IV q4hprn as pain was still very Uncontrolled -Appreciate Orthopedic Surgery Management   Normocytic Anemia in the setting of Acute Blood Loss Anemia -C/w Ferrous Sulfate 325 mg po BID -Patient is s/p 1 unit of PRBC on 12/24/16.  -Hemoglobin trended back down. Secondary to acute blood loss from fracture and hematoma. S/p another 2 units of PRBC with good response.  -Hb/Hct went from 9.6/30.0 -> 9.3/29.2 -> 8.2/25.5 -Repeat CBC in AM   Aortic stenosis -Status post AVR in 2012. -hold Coumadin until after surgery -C/w Heparin gtt for now and restart Warfarin   Chronic Atrial Fibrillation Rate controlled. Coumadin held secondary to anticipated surgery. -Continue Metoprolol.  -Continue Heparin gtt.  Will watch for recurrent bleeding  COPD -Stable. -C/w Anoro Ellipta 62.5-25 mcg/INH 1 puff   Chronic Diastolic Heart Failure -Currently Euvolemic. -C/w Metoprolol 50 mg po BID -C/w Furosemide 40 mg po BID along with Potassium Supplementation 20 mEQ -Strict I's/O's, Daily Weights   CKD stage 4 -BUN/Cr went from 31/1.47 -> 33/1.55 -Continue to Monitor and Repeat CMP in AM  Chronic Thrombocytopenia -Platelet Count went from 144 -> 136 -Continue to Monitor for S/Sx of Bleeding -Repeat CBC in AM  Leukocytosis -Had a Fever today (likley post-op) -WBC went from 10.3 -> 13.1 -Likely Reactive from Surgery and Pain -Continue to Monitor for S/Sx of Infection  -Repeat CBC in AM  Hypokalemia, improved -K+ was stable at 4.0 -Continue to Monitor and Replete as Necessary -Repeat CMP in AM  Hx of Bladder Cancer -C/w Doxazosin 4 mg po qHS and Finasteride 5 mg po Daily   Depression -C/w Home Wellbutrin   DVT prophylaxis: Anticoagulated with Heparin gtt Code Status: FULL CODE Family Communication: Discussed with Son at Bedside Disposition Plan: Per Primary but likley SNF based on PT/OT Reccs  Consultants:   TRH  Procedures:  Left Proximal Humerus Fracture s/p Reverse Shoulder Arthoplasty on 12/31/16   Antimicrobials: Anti-infectives (From admission, onward)   Start     Dose/Rate Route Frequency Ordered Stop   12/31/16 1830  ceFAZolin (ANCEF) IVPB 1 g/50 mL premix     1 g 100 mL/hr over 30 Minutes Intravenous Every 6 hours 12/31/16 1828 01/01/17 0701   12/27/16 0600  ceFAZolin (ANCEF) IVPB 2g/100 mL premix     2 g 200 mL/hr over 30 Minutes Intravenous On call to O.R. 12/26/16 1948  12/28/16 0559     Subjective: Seen and examined at bedside and had severe uncontrolled pain. No lightheadedness or dizziness. Was agitated that his pain was not improved. No CP or SOB.   Objective: Vitals:   12/31/16 1815 12/31/16 1846 12/31/16 2044 01/01/17 0435  BP: (!) 134/50 (!)  146/60 (!) 144/61 (!) 124/44  Pulse: 67 86 77 78  Resp: 17 18 16 16   Temp:  (!) 97.4 F (36.3 C) 98.6 F (37 C) (!) 100.9 F (38.3 C)  TempSrc:  Oral Oral Axillary  SpO2: 99% 100% 100% 98%  Weight:      Height:        Intake/Output Summary (Last 24 hours) at 01/01/2017 0754 Last data filed at 01/01/2017 0430 Gross per 24 hour  Intake 1922.17 ml  Output 1500 ml  Net 422.17 ml   Filed Weights   12/26/16 1739 12/31/16 1246  Weight: 81.6 kg (179 lb 14.3 oz) 81.2 kg (179 lb)   Examination: Physical Exam:  Constitutional: Elderly Caucasian male who is complaining of significant shoulder pain  Eyes: PERRL, lids and conjunctivae normal, sclerae anicteric  ENMT: External Ears, Nose appear normal. Grossly normal hearing. Mucous membranes are moist  Neck: Appears normal, supple, no cervical masses, normal ROM, no appreciable thyromegaly, no JVD Respiratory: Diminished to auscultation bilaterally, no wheezing, rales, rhonchi or crackles. Normal respiratory effort and patient is not tachypenic. No accessory muscle use.  Cardiovascular: Irregularly Irregular, 3/6 Murmur. S1 and S2 auscultated. No extremity edema.  Abdomen: Soft, non-tender, non-distended. No masses palpated. No appreciable hepatosplenomegaly. Bowel sounds positive.  GU: Deferred. Musculoskeletal: No clubbing / cyanosis of digits/nails. Has Left Shoulder stabilized.  Skin: Has some upper left Extremity and chest bruising. No induration; Warm and dry.  Neurologic: CN 2-12 grossly intact with no focal deficits. Romberg sign and  cerebellar reflexes not assessed.  Psychiatric: Normal judgment and insight. Alert and oriented x 3. Slightly agitated mood and appropriate affect.   Data Reviewed: I have personally reviewed following labs and imaging studies  CBC: Recent Labs  Lab 12/28/16 0453 12/29/16 0449 12/30/16 0507 12/31/16 0609 12/31/16 1848 01/01/17 0407  WBC 11.5* 11.0* 10.6* 10.3  --  13.1*  HGB 10.0* 9.2* 9.7*  9.6* 9.3* 8.2*  HCT 30.4* 27.9* 29.9* 30.0* 29.2* 25.5*  MCV 93.8 94.6 95.5 97.1  --  97.3  PLT 119* 120* 139* 144*  --  332*   Basic Metabolic Panel: Recent Labs  Lab 12/28/16 1221 12/29/16 0449 12/30/16 0725 12/31/16 1100 01/01/17 0407  NA 137 134* 136 139 138  K 2.9* 2.9* 3.3* 3.7 4.0  CL 101 96* 99* 100* 100*  CO2 29 29 29 30 28   GLUCOSE 264* 210* 112* 141* 149*  BUN 32* 30* 32* 31* 33*  CREATININE 1.73* 1.53* 1.55* 1.47* 1.55*  CALCIUM 8.4* 8.0* 8.3* 8.6* 8.2*   GFR: Estimated Creatinine Clearance: 32.4 mL/min (A) (by C-G formula based on SCr of 1.55 mg/dL (H)). Liver Function Tests: Recent Labs  Lab 12/26/16 2249  AST 22  ALT 19  ALKPHOS 61  BILITOT 1.3*  PROT 5.4*  ALBUMIN 2.7*   No results for input(s): LIPASE, AMYLASE in the last 168 hours. No results for input(s): AMMONIA in the last 168 hours. Coagulation Profile: Recent Labs  Lab 12/28/16 0453 12/29/16 0820 12/30/16 0507 12/31/16 0609 01/01/17 0407  INR 1.22 1.26 1.20 1.24 1.29   Cardiac Enzymes: No results for input(s): CKTOTAL, CKMB, CKMBINDEX, TROPONINI in the last 168 hours. BNP (last 3  results) No results for input(s): PROBNP in the last 8760 hours. HbA1C: No results for input(s): HGBA1C in the last 72 hours. CBG: Recent Labs  Lab 12/31/16 1207 12/31/16 1706 12/31/16 2010 01/01/17 0019 01/01/17 0429  GLUCAP 158* 147* 165* 172* 141*   Lipid Profile: No results for input(s): CHOL, HDL, LDLCALC, TRIG, CHOLHDL, LDLDIRECT in the last 72 hours. Thyroid Function Tests: No results for input(s): TSH, T4TOTAL, FREET4, T3FREE, THYROIDAB in the last 72 hours. Anemia Panel: No results for input(s): VITAMINB12, FOLATE, FERRITIN, TIBC, IRON, RETICCTPCT in the last 72 hours. Sepsis Labs: No results for input(s): PROCALCITON, LATICACIDVEN in the last 168 hours.  Recent Results (from the past 240 hour(s))  Surgical PCR screen     Status: None   Collection Time: 12/26/16 11:42 PM  Result Value  Ref Range Status   MRSA, PCR NEGATIVE NEGATIVE Final   Staphylococcus aureus NEGATIVE NEGATIVE Final    Comment: (NOTE) The Xpert SA Assay (FDA approved for NASAL specimens in patients 48 years of age and older), is one component of a comprehensive surveillance program. It is not intended to diagnose infection nor to guide or monitor treatment.     Radiology Studies: Dg Shoulder Left Port  Result Date: 12/31/2016 CLINICAL DATA:  S/P left reverse shoulder arthroplasty. EXAM: LEFT SHOULDER - 1 VIEW COMPARISON:  12/25/2016 FINDINGS: The patient has undergone left reverse shoulder arthroplasty. The humeral head component overlies the glenoid fossa component on both projections. No interval fracture or subluxation. The left lung apex is unremarkable. Note is made of left atrial appendage clip. IMPRESSION: No apparent adverse features following left reverse shoulder arthroplasty. Electronically Signed   By: Nolon Nations M.D.   On: 12/31/2016 19:07   Scheduled Meds: . buPROPion  150 mg Oral Daily  . chlorhexidine  60 mL Topical Once  . doxazosin  4 mg Oral QPM  . ferrous sulfate  325 mg Oral BID  . finasteride  5 mg Oral Daily  . fluticasone  1 spray Each Nare BID  . furosemide  40 mg Oral BID  . insulin aspart  0-9 Units Subcutaneous Q4H  . insulin glargine  12 Units Subcutaneous Daily  . loratadine  10 mg Oral Daily  . metoprolol succinate  50 mg Oral BID  . potassium chloride SA  20 mEq Oral Daily  . senna-docusate  1 tablet Oral QHS  . simvastatin  20 mg Oral QPM  . umeclidinium-vilanterol  1 puff Inhalation Daily   Continuous Infusions: . sodium chloride    . heparin 1,600 Units/hr (01/01/17 0546)  . lactated ringers 10 mL/hr at 12/31/16 1247  . methocarbamol (ROBAXIN)  IV       LOS: 6 days   Kerney Elbe, DO [facility] Pager [phone]  If 7PM-7AM, please contact night-coverage [email or web address] Password [facility] 01/01/2017, 7:54 AM

## 2017-01-01 NOTE — Progress Notes (Signed)
Occupational Therapy Treatment Patient Details Name: Ronald [name] MRN: [id number] DOB: December 17, 1922 Today's Date: 01/01/2017    History of present illness Patient is a 83 y/o male previously admitted on 12/23/16 s/p mechanical fall at home; x-rays revealing L proximal humerus fracture. Patient d/c home on 12/25/16 due to personal affairs. Patient returning to hospital on 12/26/16 due to severe pain. L RTSA planned for 12/27/16, however, rescheduled for 12/31/16 due to anemia concerns. Pt s/p L reverse total shoulder arthroplasty on 11/6. Patient with a PMHx significant for DME, PVD, HTN, aortic stenosis, chronic a-fib, COPD and depression    OT comments  Pt now s/p L TSA on 11/6. Pt continues to have increased pain in LUE, significant edema. Pt completed bed mobility and stand pivot to recliner with ModA +2 this session, currently requires MaxA for UB/LB ADLs secondary to pain, generalized weakness, and LUE functional limitations. Education provided on LUE shoulder restrictions and edema reduction techniques. Feel SNF remains appropriate recommendation due to Pt's current level of assist. Will continue to follow acutely to progress Pt's safety and independence with ADLs and mobility.    Follow Up Recommendations  SNF;Supervision/Assistance - 24 hour    Equipment Recommendations  Other (comment)(defer to next venue )          Precautions / Restrictions Precautions Precautions: Fall;Shoulder Type of Shoulder Precautions: per order set, no PROM or AROM to L shoulder, Ok for scap retractions and full elbow/wrist AROM/PROM as tolerated. Shoulder Interventions: Shoulder sling/immobilizer Precaution Comments: verbally reviewed shoulder precautions Required Braces or Orthoses: Sling Restrictions Weight Bearing Restrictions: Yes LUE Weight Bearing: Non weight bearing Other Position/Activity Restrictions: Sling       Mobility Bed Mobility Overal bed mobility: Needs Assistance Bed Mobility:  Supine to Sit     Supine to sit: Mod assist;+2 for physical assistance Sit to supine: Max assist;+2 for physical assistance   General bed mobility comments: Mod A +2 for trunk elevation and for scooting hips to EOB using bed pad. Pt very guarded secondary to pain.   Transfers Overall transfer level: Needs assistance Equipment used: 1 person hand held assist Transfers: Sit to/from [facility] Sit to Stand: Mod assist;+2 physical assistance Stand pivot transfers: Mod assist;+2 physical assistance       General transfer comment: 1 person HHA on the R and +2 assist for lift assist and steadying throughout transfer. Pt required cues for full upright posture and required assist for controlled descent into chair upon sitting. Min-mod A for steadying throughout stand pivot to chair.     Balance Overall balance assessment: Needs assistance Sitting-balance support: Single extremity supported;Feet supported Sitting balance-Leahy Scale: Poor Sitting balance - Comments: poor sitting posture with forward head and rounded shoulders. Required min guard for safety throughout sitting and use of RUE.    Standing balance support: Single extremity supported;During functional activity Standing balance-Leahy Scale: Poor Standing balance comment: Reliant on RUE support and external support for balance                            ADL either performed or assessed with clinical judgement   ADL Overall ADL's : Needs assistance/impaired     Grooming: Sitting;Wash/dry face;Set up Grooming Details (indicate cue type and reason): set up assist to wash face, close minguard while sitting EOB for safety          Upper Body Dressing : Maximal assistance;Sitting Upper Body Dressing Details (indicate cue type and  reason): Max A for managing sling Lower Body Dressing: Total assistance Lower Body Dressing Details (indicate cue type and reason): total assist to don socks at bed level               Functional mobility during ADLs: +2 for physical assistance;Cueing for sequencing;Moderate assistance General ADL Comments: Pt required ModA +2 for stand pivot transfer to chair; Pt's LUE very edemous and bruised, educated on edema reduction techniques and ROM to elbow, wrist and hand, squeeze ball issued                       Cognition Arousal/Alertness: Awake/alert Behavior During Therapy: WFL for tasks assessed/performed Overall Cognitive Status: Within Functional Limits for tasks assessed                                          Exercises Shoulder Exercises Elbow Flexion: AAROM;Left;Seated;10 reps Elbow Extension: AAROM;Left;Seated;10 reps Wrist Flexion: 10 reps;Left;Seated;AROM Wrist Extension: 10 reps;Left;Seated;AROM Digit Composite Flexion: AROM;10 reps;Left;Seated Composite Extension: AROM;10 reps;Left;Seated          General Comments Pt continues to have significant edema/bruising to LUE and L side    Pertinent Vitals/ Pain       Pain Assessment: Faces Faces Pain Scale: Hurts whole lot Pain Location: LUE Pain Descriptors / Indicators: Aching;Grimacing;Guarding;Sharp;Operative site guarding Pain Intervention(s): Limited activity within patient's tolerance;Monitored during session;Repositioned                                                          Frequency  Min 2X/week        Progress Toward Goals  OT Goals(current goals can now be found in the care plan section)  Progress towards OT goals: Progressing toward goals(slowly )  Acute Rehab OT Goals Patient Stated Goal: to decrease pain  OT Goal Formulation: With patient Time For Goal Achievement: 01/07/17 Potential to Achieve Goals: Good  Plan Discharge plan remains appropriate    Co-evaluation    PT/OT/SLP Co-Evaluation/Treatment: Yes Reason for Co-Treatment: Complexity of the patient's impairments (multi-system involvement);For  patient/therapist safety;To address functional/ADL transfers PT goals addressed during session: Mobility/safety with mobility;Balance OT goals addressed during session: ADL's and self-care      AM-PAC PT "6 Clicks" Daily Activity     Outcome Measure   Help from another person eating meals?: A Little Help from another person taking care of personal grooming?: A Little Help from another person toileting, which includes using toliet, bedpan, or urinal?: A Lot Help from another person bathing (including washing, rinsing, drying)?: A Lot Help from another person to put on and taking off regular upper body clothing?: A Lot Help from another person to put on and taking off regular lower body clothing?: A Lot 6 Click Score: 14    End of Session Equipment Utilized During Treatment: Other (comment)(sling )  OT Visit Diagnosis: Unsteadiness on feet (R26.81);History of falling (Z91.81);Muscle weakness (generalized) (M62.81);Pain Pain - Right/Left: Left Pain - part of body: Shoulder   Activity Tolerance Patient tolerated treatment well   Patient Left with call bell/phone within reach;with family/visitor present;in bed;with bed alarm set   Nurse Communication Mobility status        Time:  5681-2751 OT Time Calculation (min): 31 min  Charges: OT General Charges $OT Visit: 1 Visit OT Treatments $Self Care/Home Management : 8-22 mins  Ronald [name], [place] Pager [phone] 01/01/2017    Ronald [name] 01/01/2017, 2:37 PM

## 2017-01-01 NOTE — Progress Notes (Signed)
ANTICOAGULATION CONSULT NOTE - Follow Up Consult  Pharmacy Consult:  Heparin Indication: atrial fibrillation  Allergies  Allergen Reactions  . Aspirin Anaphylaxis, Shortness Of Breath and Rash    "broke out in white welts; red blotches neck and face; windpipe closing; ~ 1962"    Patient Measurements: Height: 5' 10.98" (180.3 cm) Weight: 179 lb (81.2 kg) IBW/kg (Calculated) : 75.26 Heparin Dosing Weight: 81 kg  Vital Signs: Temp: 100.9 F (38.3 C) (11/07 0435) Temp Source: Axillary (11/07 0435) BP: 124/44 (11/07 0435) Pulse Rate: 78 (11/07 0435)  Labs: Recent Labs    12/30/16 0507 12/30/16 0725  12/30/16 2251 12/31/16 0609 12/31/16 1100 12/31/16 1848 01/01/17 0407 01/01/17 1234  HGB 9.7*  --   --   --  9.6*  --  9.3* 8.2*  --   HCT 29.9*  --   --   --  30.0*  --  29.2* 25.5*  --   PLT 139*  --   --   --  144*  --   --  136*  --   LABPROT 15.1  --   --   --  15.5*  --   --  15.9*  --   INR 1.20  --   --   --  1.24  --   --  1.29  --   HEPARINUNFRC 0.23*  --    < > 0.57 0.50  --   --   --  0.29*  CREATININE  --  1.55*  --   --   --  1.47*  --  1.55*  --    < > = values in this interval not displayed.    Estimated Creatinine Clearance: 32.4 mL/min (A) (by C-G formula based on SCr of 1.55 mg/dL (H)).   Assessment: 89 YOM on Coumadin PTA for atrial fibrillation.  Coumadin held and transitioned to IV heparin for shoulder surgery on 12/31/16.  Heparin resumed post surgery.    Heparin level slightly sub-therapeutic.  Level was obtained 1.5 hours early and patient was previously therapeutic on this rate.  No bleeding today per RN.   Goal of Therapy:  Heparin level 0.3-0.7 units/ml Monitor platelets by anticoagulation protocol: Yes    Plan:  Increase heparin gtt slightly to 1650 units/hr Daily heparin level and CBC Monitor for s/sx of bleeding   Dylin Breeden D. Mina Marble, PharmD, BCPS Pager:  [phone] 01/01/2017, 1:46 PM

## 2017-01-01 NOTE — Progress Notes (Signed)
Physical Therapy Treatment Patient Details Name: Ronald [name] MRN: [id number] DOB: 25-Oct-1922 Today's Date: 01/01/2017    History of Present Illness Patient is a 83 y/o male previously admitted on 12/23/16 s/p mechanical fall at home; x-rays revealing L proximal humerus fracture. Patient d/c home on 12/25/16 due to personal affairs. Patient returning to hospital on 12/26/16 due to severe pain. L RTSA planned for 12/27/16, however, rescheduled for 12/31/16 due to anemia concerns. Pt s/p L reverse total shoulder arthroplasty on 11/6. Patient with a PMHx significant for DME, PVD, HTN, aortic stenosis, chronic a-fib, COPD and depression     PT Comments    Pt with slow progression towards goals, as pt very limited by L shoulder pain following L reverse total shoulder arthroplasty on 11/6. Required mod A +2 for bed mobility and for transfer to recliner this session. Pt very guarded throughout session and mobility limited. Current recommendations remain appropriate. Will continue to follow acutely to maximize functional mobility independence and safety.    Follow Up Recommendations  SNF;Supervision/Assistance - 24 hour     Equipment Recommendations  None recommended by PT    Recommendations for Other Services       Precautions / Restrictions Precautions Precautions: Fall;Shoulder Type of Shoulder Precautions: Per OT, no PROM or AROM to L shoulder  Shoulder Interventions: Shoulder sling/immobilizer Required Braces or Orthoses: Sling Restrictions Weight Bearing Restrictions: Yes LUE Weight Bearing: Non weight bearing Other Position/Activity Restrictions: Sling    Mobility  Bed Mobility Overal bed mobility: Needs Assistance Bed Mobility: Supine to Sit     Supine to sit: Mod assist;+2 for physical assistance     General bed mobility comments: Mod A +2 for trunk elevation and for scooting hips to EOB using bed pad. Pt very guarded secondary to pain.   Transfers Overall transfer level:  Needs assistance Equipment used: 1 person hand held assist Transfers: Sit to/from [facility] Sit to Stand: Mod assist;+2 physical assistance Stand pivot transfers: Mod assist;+2 physical assistance       General transfer comment: 1 person HHA on the R and +2 assist for lift assist and steadying throughout transfer. Pt required cues for full upright posture and required assist for controlled descent into chair upon sitting. Min-mod A for steadying throughout stand pivot to chair.   Ambulation/Gait         Gait velocity: NT secondary to increased pain        Stairs            Wheelchair Mobility    Modified Rankin (Stroke Patients Only)       Balance Overall balance assessment: Needs assistance Sitting-balance support: Single extremity supported;Feet supported Sitting balance-Leahy Scale: Poor Sitting balance - Comments: poor sitting posture with forward head and rounded shoulders. Required min guard for safety throughout sitting and use of RUE.    Standing balance support: Single extremity supported;During functional activity Standing balance-Leahy Scale: Poor Standing balance comment: Reliant on RUE support and external support for balance                             Cognition Arousal/Alertness: Awake/alert Behavior During Therapy: WFL for tasks assessed/performed Overall Cognitive Status: Within Functional Limits for tasks assessed  Exercises      General Comments        Pertinent Vitals/Pain Pain Assessment: Faces Faces Pain Scale: Hurts whole lot Pain Location: LUE Pain Descriptors / Indicators: Aching;Grimacing;Guarding;Sharp;Operative site guarding Pain Intervention(s): Limited activity within patient's tolerance;Monitored during session;Repositioned    Home Living                      Prior Function            PT Goals (current goals can now be  found in the care plan section) Acute Rehab PT Goals Patient Stated Goal: to decrease pain  PT Goal Formulation: With patient Time For Goal Achievement: 01/11/17 Potential to Achieve Goals: Fair Progress towards PT goals: Progressing toward goals    Frequency    Min 3X/week      PT Plan Current plan remains appropriate    Co-evaluation PT/OT/SLP Co-Evaluation/Treatment: Yes Reason for Co-Treatment: Complexity of the patient's impairments (multi-system involvement);For patient/therapist safety;To address functional/ADL transfers PT goals addressed during session: Mobility/safety with mobility;Balance        AM-PAC PT "6 Clicks" Daily Activity  Outcome Measure  Difficulty turning over in bed (including adjusting bedclothes, sheets and blankets)?: Unable Difficulty moving from lying on back to sitting on the side of the bed? : Unable Difficulty sitting down on and standing up from a chair with arms (e.g., wheelchair, bedside commode, etc,.)?: Unable Help needed moving to and from a bed to chair (including a wheelchair)?: A Lot Help needed walking in hospital room?: A Lot Help needed climbing 3-5 steps with a railing? : Total 6 Click Score: 8    End of Session Equipment Utilized During Treatment: Gait belt;Other (comment)(LUE sling ) Activity Tolerance: Patient limited by pain Patient left: in chair;with call bell/phone within reach;with chair alarm set;with family/visitor present Nurse Communication: Mobility status PT Visit Diagnosis: Unsteadiness on feet (R26.81);Other abnormalities of gait and mobility (R26.89);Muscle weakness (generalized) (M62.81);Pain Pain - Right/Left: Left Pain - part of body: Shoulder     Time: 3710-6269 PT Time Calculation (min) (ACUTE ONLY): 31 min  Charges:  $Therapeutic Activity: 8-22 mins                    G Codes:       Leighton Ruff, PT, DPT  Acute Rehabilitation Services  Pager: [phone]    Rudean Hitt 01/01/2017, 1:22 PM

## 2017-01-02 LAB — PROTIME-INR
INR: 1.38
Prothrombin Time: 16.8 seconds — ABNORMAL HIGH (ref 11.4–15.2)

## 2017-01-02 LAB — GLUCOSE, CAPILLARY
GLUCOSE-CAPILLARY: 165 mg/dL — AB (ref 65–99)
GLUCOSE-CAPILLARY: 159 mg/dL — AB (ref 65–99)
Glucose-Capillary: 179 mg/dL — ABNORMAL HIGH (ref 65–99)
Glucose-Capillary: 203 mg/dL — ABNORMAL HIGH (ref 65–99)
Glucose-Capillary: 222 mg/dL — ABNORMAL HIGH (ref 65–99)
Glucose-Capillary: 236 mg/dL — ABNORMAL HIGH (ref 65–99)
Glucose-Capillary: 304 mg/dL — ABNORMAL HIGH (ref 65–99)

## 2017-01-02 LAB — CBC
HCT: 24.4 % — ABNORMAL LOW (ref 39.0–52.0)
HEMOGLOBIN: 8 g/dL — AB (ref 13.0–17.0)
MCH: 32.1 pg (ref 26.0–34.0)
MCHC: 32.8 g/dL (ref 30.0–36.0)
MCV: 98 fL (ref 78.0–100.0)
Platelets: 139 10*3/uL — ABNORMAL LOW (ref 150–400)
RBC: 2.49 MIL/uL — AB (ref 4.22–5.81)
RDW: 18.2 % — ABNORMAL HIGH (ref 11.5–15.5)
WBC: 11.8 10*3/uL — ABNORMAL HIGH (ref 4.0–10.5)

## 2017-01-02 LAB — HEPARIN LEVEL (UNFRACTIONATED): Heparin Unfractionated: 0.34 IU/mL (ref 0.30–0.70)

## 2017-01-02 LAB — BASIC METABOLIC PANEL
ANION GAP: 8 (ref 5–15)
BUN: 37 mg/dL — ABNORMAL HIGH (ref 6–20)
CALCIUM: 8.3 mg/dL — AB (ref 8.9–10.3)
CO2: 31 mmol/L (ref 22–32)
Chloride: 96 mmol/L — ABNORMAL LOW (ref 101–111)
Creatinine, Ser: 1.68 mg/dL — ABNORMAL HIGH (ref 0.61–1.24)
GFR, EST AFRICAN AMERICAN: 39 mL/min — AB (ref >60)
GFR, EST NON AFRICAN AMERICAN: 34 mL/min — AB (ref >60)
Glucose, Bld: 198 mg/dL — ABNORMAL HIGH (ref 65–99)
Potassium: 3.5 mmol/L (ref 3.5–5.1)
Sodium: 135 mmol/L (ref 135–145)

## 2017-01-02 LAB — MAGNESIUM: MAGNESIUM: 2 mg/dL (ref 1.7–2.4)

## 2017-01-02 LAB — PREPARE RBC (CROSSMATCH)

## 2017-01-02 LAB — PHOSPHORUS: PHOSPHORUS: 3 mg/dL (ref 2.5–4.6)

## 2017-01-02 MED ORDER — WARFARIN - PHARMACIST DOSING INPATIENT: Freq: Every day | Status: DC

## 2017-01-02 MED ORDER — SODIUM CHLORIDE 0.9 % IV SOLN
Freq: Once | INTRAVENOUS | Status: AC
  Administered 2017-01-02: 10:00:00 via INTRAVENOUS

## 2017-01-02 MED ORDER — WARFARIN SODIUM 3 MG PO TABS: 3.0000 mg | ORAL_TABLET | Freq: Once | ORAL | Status: DC

## 2017-01-02 NOTE — Progress Notes (Addendum)
Physical Therapy Treatment Patient Details Name: Ronald [name] MRN: [id number] DOB: 10-May-1922 Today's Date: 01/02/2017    History of Present Illness Patient is a 83 y/o male previously admitted on 12/23/16 s/p mechanical fall at home; x-rays revealing L proximal humerus fracture. Patient d/c home on 12/25/16 due to personal affairs. Patient returning to hospital on 12/26/16 due to severe pain. L RTSA planned for 12/27/16, however, rescheduled for 12/31/16 due to anemia concerns. Pt s/p L reverse total shoulder arthroplasty on 11/6. Patient with a PMHx significant for DME, PVD, HTN, aortic stenosis, chronic a-fib, COPD and depression     PT Comments    Patient continues to require mod/max A for bed mobility and functional transfers. Attempt to progress gait if able next session. Current plan remains appropriate.    Follow Up Recommendations  SNF;Supervision/Assistance - 24 hour     Equipment Recommendations  None recommended by PT    Recommendations for Other Services OT consult     Precautions / Restrictions Precautions Precautions: Fall;Shoulder Type of Shoulder Precautions: per order set, no PROM or AROM to L shoulder, Ok for scap retractions and full elbow/wrist AROM/PROM as tolerated. Shoulder Interventions: Shoulder sling/immobilizer Precaution Comments: verbally reviewed shoulder precautions Required Braces or Orthoses: Sling Restrictions Weight Bearing Restrictions: Yes LUE Weight Bearing: Non weight bearing Other Position/Activity Restrictions: Sling    Mobility  Bed Mobility Overal bed mobility: Needs Assistance Bed Mobility: Supine to Sit     Supine to sit: +2 for physical assistance;Max assist;HOB elevated     General bed mobility comments: cues for sequencing; assist to bring bilat LE toward EOB and to elevate trunk into sitting  Transfers Overall transfer level: Needs assistance Equipment used: 2 person hand held assist Transfers: Sit to/from [employer] Sit to Stand: Mod assist;+2 physical assistance Stand pivot transfers: Mod assist;+2 physical assistance       General transfer comment: assist to power up into standing and for balance with wieght shifting in standing  Ambulation/Gait                 Stairs            Wheelchair Mobility    Modified Rankin (Stroke Patients Only)       Balance Overall balance assessment: Needs assistance Sitting-balance support: Single extremity supported;Feet supported Sitting balance-Leahy Scale: Poor Sitting balance - Comments: poor sitting posture with forward head and rounded shoulders. Required min guard for safety throughout sitting and use of RUE.    Standing balance support: Single extremity supported;During functional activity Standing balance-Leahy Scale: Poor Standing balance comment: Reliant on RUE support and external support for balance                             Cognition Arousal/Alertness: Awake/alert(pt drowsy initially) Behavior During Therapy: WFL for tasks assessed/performed Overall Cognitive Status: Within Functional Limits for tasks assessed Area of Impairment: Problem solving                     Memory: Decreased short-term memory       Problem Solving: Slow processing;Decreased initiation        Exercises      General Comments        Pertinent Vitals/Pain Pain Assessment: Faces Faces Pain Scale: Hurts even more Pain Location: L shoulder with mobility  Pain Descriptors / Indicators: Aching;Grimacing;Guarding;Sore Pain Intervention(s): Limited activity within patient's tolerance;Monitored during session;Repositioned  Home Living                      Prior Function            PT Goals (current goals can now be found in the care plan section) Acute Rehab PT Goals Patient Stated Goal: to decrease pain  PT Goal Formulation: With patient Time For Goal Achievement: 01/11/17 Potential to  Achieve Goals: Fair Progress towards PT goals: Progressing toward goals    Frequency    Min 3X/week      PT Plan Current plan remains appropriate    Co-evaluation              AM-PAC PT "6 Clicks" Daily Activity  Outcome Measure  Difficulty turning over in bed (including adjusting bedclothes, sheets and blankets)?: Unable Difficulty moving from lying on back to sitting on the side of the bed? : Unable Difficulty sitting down on and standing up from a chair with arms (e.g., wheelchair, bedside commode, etc,.)?: Unable Help needed moving to and from a bed to chair (including a wheelchair)?: A Lot Help needed walking in hospital room?: A Lot Help needed climbing 3-5 steps with a railing? : Total 6 Click Score: 8    End of Session Equipment Utilized During Treatment: Gait belt;Other (comment)(LUE sling ) Activity Tolerance: Patient limited by pain Patient left: in chair;with call bell/phone within reach;with chair alarm set Nurse Communication: Mobility status PT Visit Diagnosis: Unsteadiness on feet (R26.81);Other abnormalities of gait and mobility (R26.89);Muscle weakness (generalized) (M62.81);Pain Pain - Right/Left: Left Pain - part of body: Shoulder     Time: 7342-8768 PT Time Calculation (min) (ACUTE ONLY): 22 min  Charges:  $Therapeutic Activity: 8-22 mins                    G Codes:       Earney Navy, PTA Pager: [phone]     Darliss Cheney 01/02/2017, 3:31 PM

## 2017-01-02 NOTE — Progress Notes (Signed)
Inpatient Diabetes Program Recommendations  AACE/ADA: New Consensus Statement on Inpatient Glycemic Control (2015)  Target Ranges:  Prepandial:   less than 140 mg/dL      Peak postprandial:   less than 180 mg/dL (1-2 hours)      Critically ill patients:  140 - 180 mg/dL  Results for DAINE, Ronald [name] (MRN [id number]) as of 01/02/2017 11:35  Ref. Range 01/01/2017 08:38 01/01/2017 12:10 01/01/2017 16:41 01/01/2017 20:07 01/02/2017 00:50 01/02/2017 04:57 01/02/2017 07:58  Glucose-Capillary Latest Ref Range: 65 - 99 mg/dL 219 (H) 297 (H) 310 (H) 393 (H) 304 (H) 203 (H) 179 (H)    Review of Glycemic Control  Diabetes history: DM2 Outpatient Diabetes medications: Lantus 10 units daily, Glyburide 10 mg QAM, Glyburide 2.5 mg QPM Current orders for Inpatient glycemic control: Lantus 12 units daily, Novolog 0-9 units Q4H  Inpatient Diabetes Program Recommendations: Insulin - Basal: Please consider increasing Lantus to 15 units daily. Insulin - Meal Coverage: Please consider ordering Novolog 4 units TID with meals for meal coverage if patient eats at least 50% of meals. Insulin-Correction: Please consider changing frequency of CBGs and Novolog correction to ACHS.  Thanks, Barnie Alderman, RN, MSN, CDE Diabetes Coordinator Inpatient Diabetes Program [phone] (Team Pager from 8am to 5pm)

## 2017-01-02 NOTE — Progress Notes (Signed)
PROGRESS NOTE    Ronald [name]  MVH:[id number] DOB: Mar 18, 1922 DOA: 12/26/2016 PCP: [name], MD   Brief Narrative:  Ronald [name] is a 83 y.o. male with a history of AS s/p bovine AVR, chronic diastolic CHF, CAD, COPD, Depression, Hypertension, Thrombocytopenia, Diabetes Mellitus. He presented with left shoulder pain found to have a humeral fracture. He left against recommendations and has returned for his surgery, which was unfortunately been cancelled secondary to anemia and rescheduled 12/31/16. Patient is POD 2 and Pain is better controlled today. Coumadin restarted today and Pharmacy to dose.    Assessment & Plan:   Principal Problem:   Closed fracture of left proximal humerus Active Problems:   Diabetes mellitus with complication (HCC)   Essential hypertension   Thrombocytopenia (HCC)   Chronic atrial fibrillation (HCC)   COPD (chronic obstructive pulmonary disease) (HCC)   Chronic diastolic heart failure (HCC)   Chronic renal insufficiency, stage IV (severe) (HCC)   Anemia  Comminuted Left Humeral Fracture s/p Reverse Should Arthoplasty POD 2 -Patient was previously admitted for this fracture but left against medical advice to attend to some home affairs. Underwent Surgery 11/6 -Analgesics prn; Added Dilaudid 0.5 mg IV q4hprn as pain was still very Uncontrolled -Appreciate Orthopedic Surgery Management  -Per Ortho Recc's: C/w NWB to LUE and maintain abduction sling. Per Dr. Stann Mainland ok to use left arm and hand for ADL's as well as with arm by side  Normocytic Anemia in the setting of Acute Blood Loss Anemia -C/w Ferrous Sulfate 325 mg po BID -Patient is s/p 1 unit of PRBC on 12/24/16.  -Hemoglobin trended back down. Secondary to acute blood loss from fracture and hematoma. S/p another 2 units of PRBC with good response this Admission  -Hb/Hct went from 9.6/30.0 -> 9.3/29.2 -> 8.2/25.5 -> 8.0/24.4 -Will transfuse 1 more unit of pRBC -Repeat CBC in AM   Aortic  stenosis -Status post AVR in 2012. -Held Coumadin until after surgery -C/w Heparin gtt for now and restart Warfarin tonight -Appreciate Pharmacy to Dose  Chronic Atrial Fibrillation -Rate controlled. Coumadin was held secondary to anticipated surgery. -Continue Metoprolol.  -Continue Heparin gtt and restart Warfarin tonight. Will watch for recurrent bleeding -INR was 1.38 -Appreciate Pharmacy to dose  COPD -Stable. -C/w Anoro Ellipta 62.5-25 mcg/INH 1 puff   Chronic Diastolic Heart Failure -Currently Euvolemic. -C/w Metoprolol 50 mg po BID -C/w Furosemide 40 mg po BID along with Potassium Supplementation 20 mEQ -Strict I's/O's, Daily Weights  -Patient is -4.539 Liters  CKD stage 4 -BUN/Cr went from 31/1.47 -> 33/1.55 -> 37/1.68 -Give 1 unit of pRBC and re-evaluate in AM -Continue to Monitor and Repeat CMP in AM  Chronic Thrombocytopenia -Platelet Count went from 144 -> 136 -> 139 -Continue to Monitor for S/Sx of Bleeding -Repeat CBC in AM  Leukocytosis -Had a Fever yesterday (likley post-op); Afebrile today  -WBC went from 10.3 -> 13.1 -> 11.8 -Likely Reactive from Surgery and Pain -Continue to Monitor for S/Sx of Infection  -Repeat CBC in AM  Hypokalemia, improved -K+ was stable at 3.5 today  -Continue to Monitor and Replete as Necessary -Repeat CMP in AM  Hx of Bladder Cancer -C/w Doxazosin 4 mg po qHS and Finasteride 5 mg po Daily   Depression -C/w Home Wellbutrin 150 mg po Daily   DVT prophylaxis: Anticoagulated with Heparin gtt will  restart Home AC with Coumadin  Code Status: FULL CODE Family Communication: No Family present at bedside Disposition Plan: Per Primary but likley  SNF based on PT/OT Reccs  Consultants:   TRH  Procedures:  Left Proximal Humerus Fracture s/p Reverse Shoulder Arthoplasty on 12/31/16   Antimicrobials: Anti-infectives (From admission, onward)   Start     Dose/Rate Route Frequency Ordered Stop   12/31/16 1830   ceFAZolin (ANCEF) IVPB 1 g/50 mL premix     1 g 100 mL/hr over 30 Minutes Intravenous Every 6 hours 12/31/16 1828 01/01/17 1201   12/27/16 0600  ceFAZolin (ANCEF) IVPB 2g/100 mL premix     2 g 200 mL/hr over 30 Minutes Intravenous On call to O.R. 12/26/16 1948 12/28/16 0559     Subjective: Seen and examined at bedside and stated pain was better controlled today. Denied any CP or SOB. Was agitated that family wasn't here this AM. No other concerns or complaints at this time  Objective: Vitals:   01/01/17 0435 01/01/17 1500 01/01/17 2009 01/02/17 0455  BP: (!) 124/44 (!) 121/45 (!) 134/51 131/61  Pulse: 78 98 75 91  Resp: 16 18 16 16   Temp: (!) 100.9 F (38.3 C) 99 F (37.2 C) 98.2 F (36.8 C) 98.4 F (36.9 C)  TempSrc: Axillary Axillary Oral Axillary  SpO2: 98% 98% 97% 99%  Weight:      Height:        Intake/Output Summary (Last 24 hours) at 01/02/2017 0749 Last data filed at 01/02/2017 0537 Gross per 24 hour  Intake 464.85 ml  Output 800 ml  Net -335.15 ml   Filed Weights   12/26/16 1739 12/31/16 1246  Weight: 81.6 kg (179 lb 14.3 oz) 81.2 kg (179 lb)   Examination: Physical Exam:  Constitutional: Elderly Caucasian male in NAD who is agitated that his family hasn't come to see him today.  Eyes: Sclerae anicteric. Lids normal ENMT: External Ears and nose appear normal. Sclerae anicteric Neck: Supple with No appreciable JVD Respiratory: Diminished to auscultation but no wheezing/rales/rhonchi. Patient was not tachypenic or using any acecssory muscles to breathe.  Cardiovascular: Irregularly Irregular. 3/6 Systolic Murmur. No appreciable Extremity edema Abdomen: Soft, NT, ND. Bowel sounds present GU: Deferred Musculoskeletal: No contractures; No cyanosis Skin: Warm and Dry. Right arm in sling but had some bruising noted Neurologic: CN 2-12 grossly intact. No appreciable focal deficits Psychiatric: Normal judgement and insight. Awake and alert. Slightly agitated    Data Reviewed: I have personally reviewed following labs and imaging studies  CBC: Recent Labs  Lab 12/29/16 0449 12/30/16 0507 12/31/16 0609 12/31/16 1848 01/01/17 0407 01/02/17 0509  WBC 11.0* 10.6* 10.3  --  13.1* 11.8*  HGB 9.2* 9.7* 9.6* 9.3* 8.2* 8.0*  HCT 27.9* 29.9* 30.0* 29.2* 25.5* 24.4*  MCV 94.6 95.5 97.1  --  97.3 98.0  PLT 120* 139* 144*  --  136* 161*   Basic Metabolic Panel: Recent Labs  Lab 12/29/16 0449 12/30/16 0725 12/31/16 1100 01/01/17 0407 01/02/17 0509  NA 134* 136 139 138 135  K 2.9* 3.3* 3.7 4.0 3.5  CL 96* 99* 100* 100* 96*  CO2 29 29 30 28 31   GLUCOSE 210* 112* 141* 149* 198*  BUN 30* 32* 31* 33* 37*  CREATININE 1.53* 1.55* 1.47* 1.55* 1.68*  CALCIUM 8.0* 8.3* 8.6* 8.2* 8.3*  MG  --   --   --   --  2.0  PHOS  --   --   --   --  3.0   GFR: Estimated Creatinine Clearance: 29.9 mL/min (A) (by C-G formula based on SCr of 1.68 mg/dL (H)). Liver Function Tests:  Recent Labs  Lab 12/26/16 2249  AST 22  ALT 19  ALKPHOS 61  BILITOT 1.3*  PROT 5.4*  ALBUMIN 2.7*   No results for input(s): LIPASE, AMYLASE in the last 168 hours. No results for input(s): AMMONIA in the last 168 hours. Coagulation Profile: Recent Labs  Lab 12/29/16 0820 12/30/16 0507 12/31/16 0609 01/01/17 0407 01/02/17 0509  INR 1.26 1.20 1.24 1.29 1.38   Cardiac Enzymes: No results for input(s): CKTOTAL, CKMB, CKMBINDEX, TROPONINI in the last 168 hours. BNP (last 3 results) No results for input(s): PROBNP in the last 8760 hours. HbA1C: No results for input(s): HGBA1C in the last 72 hours. CBG: Recent Labs  Lab 01/01/17 1210 01/01/17 1641 01/01/17 2007 01/02/17 0050 01/02/17 0457  GLUCAP 297* 310* 393* 304* 203*   Lipid Profile: No results for input(s): CHOL, HDL, LDLCALC, TRIG, CHOLHDL, LDLDIRECT in the last 72 hours. Thyroid Function Tests: No results for input(s): TSH, T4TOTAL, FREET4, T3FREE, THYROIDAB in the last 72 hours. Anemia Panel: No  results for input(s): VITAMINB12, FOLATE, FERRITIN, TIBC, IRON, RETICCTPCT in the last 72 hours. Sepsis Labs: No results for input(s): PROCALCITON, LATICACIDVEN in the last 168 hours.  Recent Results (from the past 240 hour(s))  Surgical PCR screen     Status: None   Collection Time: 12/26/16 11:42 PM  Result Value Ref Range Status   MRSA, PCR NEGATIVE NEGATIVE Final   Staphylococcus aureus NEGATIVE NEGATIVE Final    Comment: (NOTE) The Xpert SA Assay (FDA approved for NASAL specimens in patients 24 years of age and older), is one component of a comprehensive surveillance program. It is not intended to diagnose infection nor to guide or monitor treatment.     Radiology Studies: Dg Shoulder Left Port  Result Date: 12/31/2016 CLINICAL DATA:  S/P left reverse shoulder arthroplasty. EXAM: LEFT SHOULDER - 1 VIEW COMPARISON:  12/25/2016 FINDINGS: The patient has undergone left reverse shoulder arthroplasty. The humeral head component overlies the glenoid fossa component on both projections. No interval fracture or subluxation. The left lung apex is unremarkable. Note is made of left atrial appendage clip. IMPRESSION: No apparent adverse features following left reverse shoulder arthroplasty. Electronically Signed   By: Nolon Nations M.D.   On: 12/31/2016 19:07   Scheduled Meds: . buPROPion  150 mg Oral Daily  . chlorhexidine  60 mL Topical Once  . doxazosin  4 mg Oral QPM  . ferrous sulfate  325 mg Oral BID  . finasteride  5 mg Oral Daily  . fluticasone  1 spray Each Nare BID  . furosemide  40 mg Oral BID  . insulin aspart  0-9 Units Subcutaneous Q4H  . insulin glargine  12 Units Subcutaneous Daily  . loratadine  10 mg Oral Daily  . metoprolol succinate  50 mg Oral BID  . potassium chloride SA  20 mEq Oral Daily  . senna-docusate  1 tablet Oral QHS  . simvastatin  20 mg Oral QPM  . umeclidinium-vilanterol  1 puff Inhalation Daily   Continuous Infusions: . heparin 1,650 Units/hr  (01/01/17 1406)  . lactated ringers 10 mL/hr at 12/31/16 1247  . methocarbamol (ROBAXIN)  IV       LOS: 7 days   Kerney Elbe, DO [facility] Pager [phone]  If 7PM-7AM, please contact night-coverage [email or web address] Password [facility] 01/02/2017, 7:49 AM

## 2017-01-02 NOTE — Progress Notes (Addendum)
ANTICOAGULATION CONSULT NOTE - Follow Up Consult  Pharmacy Consult:  Heparin Indication: atrial fibrillation  Allergies  Allergen Reactions  . Aspirin Anaphylaxis, Shortness Of Breath and Rash    "broke out in white welts; red blotches neck and face; windpipe closing; ~ 1962"    Patient Measurements: Height: 5' 10.98" (180.3 cm) Weight: 179 lb (81.2 kg) IBW/kg (Calculated) : 75.26 Heparin Dosing Weight: 81 kg  Assessment: Ronald [name] on Coumadin 2mg  daily exc for 3mg  on Tues/Thurs PTA for Afib >> Heparin for hip surgery 11/6, some oozing. Heparin level therapeutic at 0.34. INR 1.38. Hgb 8.0, plts wnl.  Addendum: MD called main pharmacy to approve starting warfarin at ~1800. Will start with his PTA dose today.  Goal of Therapy:  Heparin level 0.3-0.7 units/ml Monitor platelets by anticoagulation protocol: Yes   Plan:  Continue heparin gtt at 1,650 units/hr Daily heparin level and CBC  Warfarin 3mg  x1 Daily INR  Patterson Hammersmith PharmD PGY1 Pharmacy Practice Resident 01/02/2017 6:20 PM

## 2017-01-02 NOTE — Social Work (Addendum)
CSW met with daughter at bedside to discuss bed offers. Daughter interested in [facility], [facility], [facility] and [facility]. CSW left message for Candace in admissions at [facility]. CSW left message for Whitney in admissions at [facility]. CSW f/u with Nikki at [facility] and Stephanie at [facility] to see if they can offer a SNF bed.  4:20pm: [facility] and [facility] has offered a bed. [facility] does not have any beds. CSW has not heard back from [facility]. CSW will discuss offers with daughter as we will need to select a SNF so that auth can be obtained.  CSW will continue to follow.   , LCSW Clinical Social Worker [phone]   

## 2017-01-02 NOTE — Progress Notes (Signed)
OT Cancellation Note  Patient Details Name: Ronald [name] MRN: [id number] DOB: Aug 16, 1922   Cancelled Treatment:    Reason Eval/Treat Not Completed: Medical issues which prohibited therapy;Patient at procedure or test/ unavailable. RN preparing to start pt on blood transfusion  Britt Bottom 01/02/2017, 10:32 AM

## 2017-01-02 NOTE — Progress Notes (Signed)
   Subjective:  Patient reports pain as moderate, but improved.  Pain in the shoulder and left forearm.  No numbness or tingling.  Denies SOB/CP/N/V.  Objective:   VITALS:   Vitals:   01/02/17 1100 01/02/17 1242 01/02/17 1314 01/02/17 1538  BP: (!) 128/53  (!) 141/71 (!) 157/49  Pulse: 87  82 77  Resp: 16   17  Temp: 98.8 F (37.1 C)  99.3 F (37.4 C) 99.7 F (37.6 C)  TempSrc: Oral  Oral Oral  SpO2: 95% 95% 97% 100%  Weight:      Height:       LUE- Sling in place with abduction pillow. SILT ax/med/rad/uln Motor intact with ax/mc/med/ain/rad/pin/uln Hand is very edematous with fracture blisters from elbow to hand, though WWp with good cap refill and no pain with AROM or PROM Bandage with dime sized drainage at base, otherwise c/d/i.   Lab Results  Component Value Date   WBC 11.8 (H) 01/02/2017   HGB 8.0 (L) 01/02/2017   HCT 24.4 (L) 01/02/2017   MCV 98.0 01/02/2017   PLT 139 (L) 01/02/2017   BMET    Component Value Date/Time   NA 135 01/02/2017 0509   K 3.5 01/02/2017 0509   CL 96 (L) 01/02/2017 0509   CO2 31 01/02/2017 0509   GLUCOSE 198 (H) 01/02/2017 0509   BUN 37 (H) 01/02/2017 0509   CREATININE 1.68 (H) 01/02/2017 0509   CREATININE 1.50 (H) 10/18/2015 1140   CALCIUM 8.3 (L) 01/02/2017 0509   GFRNONAA 34 (L) 01/02/2017 0509   GFRAA 39 (L) 01/02/2017 0509     Assessment/Plan: 2 Days Post-Op   Principal Problem:   Closed fracture of left proximal humerus Active Problems:   Diabetes mellitus with complication (HCC)   Essential hypertension   Thrombocytopenia (HCC)   Chronic atrial fibrillation (HCC)   COPD (chronic obstructive pulmonary disease) (HCC)   Chronic diastolic heart failure (HCC)   Chronic renal insufficiency, stage IV (severe) (HCC)   Anemia  -PT/OT - NWB to LUE and maintain abduction sling - ok to use left arm/hand for ADLS as able with arm by side - ICE to left arm and hand   We appreciate medical team for assistance with his  multiple chronic and acute on chronic morbidities and their management  DISPO- likely to SNF tomorrow maybe   Nicholes Stairs 01/02/2017, 4:00 PM   Geralynn Rile, MD [phone]

## 2017-01-02 NOTE — Social Work (Signed)
Family accepted bed offer from [facility]. However, [facility] can offer, they will accept.  CSW called [facility] to start [employer].   CSW will f/u.  Elissa Hefty, LCSW Clinical Social Worker [phone]

## 2017-01-03 DIAGNOSIS — I10 Essential (primary) hypertension: Secondary | ICD-10-CM | POA: Diagnosis not present

## 2017-01-03 DIAGNOSIS — S4990XA Unspecified injury of shoulder and upper arm, unspecified arm, initial encounter: Secondary | ICD-10-CM | POA: Diagnosis not present

## 2017-01-03 DIAGNOSIS — Z741 Need for assistance with personal care: Secondary | ICD-10-CM | POA: Diagnosis not present

## 2017-01-03 DIAGNOSIS — D649 Anemia, unspecified: Secondary | ICD-10-CM | POA: Diagnosis not present

## 2017-01-03 DIAGNOSIS — I5032 Chronic diastolic (congestive) heart failure: Secondary | ICD-10-CM | POA: Diagnosis not present

## 2017-01-03 DIAGNOSIS — N179 Acute kidney failure, unspecified: Secondary | ICD-10-CM | POA: Diagnosis not present

## 2017-01-03 DIAGNOSIS — R2681 Unsteadiness on feet: Secondary | ICD-10-CM | POA: Diagnosis not present

## 2017-01-03 DIAGNOSIS — S42202D Unspecified fracture of upper end of left humerus, subsequent encounter for fracture with routine healing: Secondary | ICD-10-CM | POA: Diagnosis not present

## 2017-01-03 DIAGNOSIS — I4891 Unspecified atrial fibrillation: Secondary | ICD-10-CM | POA: Diagnosis not present

## 2017-01-03 DIAGNOSIS — Z96612 Presence of left artificial shoulder joint: Secondary | ICD-10-CM | POA: Diagnosis not present

## 2017-01-03 DIAGNOSIS — E118 Type 2 diabetes mellitus with unspecified complications: Secondary | ICD-10-CM | POA: Diagnosis not present

## 2017-01-03 DIAGNOSIS — R4182 Altered mental status, unspecified: Secondary | ICD-10-CM | POA: Diagnosis not present

## 2017-01-03 DIAGNOSIS — M6281 Muscle weakness (generalized): Secondary | ICD-10-CM | POA: Diagnosis not present

## 2017-01-03 DIAGNOSIS — N184 Chronic kidney disease, stage 4 (severe): Secondary | ICD-10-CM | POA: Diagnosis not present

## 2017-01-03 DIAGNOSIS — Z9181 History of falling: Secondary | ICD-10-CM | POA: Diagnosis not present

## 2017-01-03 DIAGNOSIS — S42309A Unspecified fracture of shaft of humerus, unspecified arm, initial encounter for closed fracture: Secondary | ICD-10-CM | POA: Diagnosis not present

## 2017-01-03 DIAGNOSIS — R262 Difficulty in walking, not elsewhere classified: Secondary | ICD-10-CM | POA: Diagnosis not present

## 2017-01-03 DIAGNOSIS — R1312 Dysphagia, oropharyngeal phase: Secondary | ICD-10-CM | POA: Diagnosis not present

## 2017-01-03 DIAGNOSIS — I482 Chronic atrial fibrillation: Secondary | ICD-10-CM | POA: Diagnosis not present

## 2017-01-03 DIAGNOSIS — D696 Thrombocytopenia, unspecified: Secondary | ICD-10-CM | POA: Diagnosis not present

## 2017-01-03 DIAGNOSIS — S42202A Unspecified fracture of upper end of left humerus, initial encounter for closed fracture: Secondary | ICD-10-CM | POA: Diagnosis not present

## 2017-01-03 DIAGNOSIS — J449 Chronic obstructive pulmonary disease, unspecified: Secondary | ICD-10-CM | POA: Diagnosis not present

## 2017-01-03 DIAGNOSIS — S42302D Unspecified fracture of shaft of humerus, left arm, subsequent encounter for fracture with routine healing: Secondary | ICD-10-CM | POA: Diagnosis not present

## 2017-01-03 DIAGNOSIS — R41841 Cognitive communication deficit: Secondary | ICD-10-CM | POA: Diagnosis not present

## 2017-01-03 LAB — GLUCOSE, CAPILLARY
GLUCOSE-CAPILLARY: 212 mg/dL — AB (ref 65–99)
GLUCOSE-CAPILLARY: 209 mg/dL — AB (ref 65–99)
Glucose-Capillary: 140 mg/dL — ABNORMAL HIGH (ref 65–99)
Glucose-Capillary: 219 mg/dL — ABNORMAL HIGH (ref 65–99)

## 2017-01-03 LAB — TYPE AND SCREEN
ABO/RH(D): O POS
ANTIBODY SCREEN: NEGATIVE
Unit division: 0

## 2017-01-03 LAB — CBC WITH DIFFERENTIAL/PLATELET
BASOS PCT: 0 %
Basophils Absolute: 0 10*3/uL (ref 0.0–0.1)
EOS PCT: 3 %
Eosinophils Absolute: 0.4 10*3/uL (ref 0.0–0.7)
HEMATOCRIT: 28.6 % — AB (ref 39.0–52.0)
Hemoglobin: 9.3 g/dL — ABNORMAL LOW (ref 13.0–17.0)
LYMPHS PCT: 11 %
Lymphs Abs: 1.5 10*3/uL (ref 0.7–4.0)
MCH: 31.8 pg (ref 26.0–34.0)
MCHC: 32.5 g/dL (ref 30.0–36.0)
MCV: 97.9 fL (ref 78.0–100.0)
MONOS PCT: 9 %
Monocytes Absolute: 1.2 10*3/uL — ABNORMAL HIGH (ref 0.1–1.0)
NEUTROS ABS: 10.3 10*3/uL — AB (ref 1.7–7.7)
NEUTROS PCT: 77 %
Platelets: 156 10*3/uL (ref 150–400)
RBC: 2.92 MIL/uL — ABNORMAL LOW (ref 4.22–5.81)
RDW: 17.4 % — ABNORMAL HIGH (ref 11.5–15.5)
WBC: 13.4 10*3/uL — ABNORMAL HIGH (ref 4.0–10.5)

## 2017-01-03 LAB — COMPREHENSIVE METABOLIC PANEL
ALK PHOS: 91 U/L (ref 38–126)
ALT: 16 U/L — AB (ref 17–63)
ANION GAP: 9 (ref 5–15)
AST: 35 U/L (ref 15–41)
Albumin: 2.3 g/dL — ABNORMAL LOW (ref 3.5–5.0)
BILIRUBIN TOTAL: 2 mg/dL — AB (ref 0.3–1.2)
BUN: 39 mg/dL — ABNORMAL HIGH (ref 6–20)
CALCIUM: 8.5 mg/dL — AB (ref 8.9–10.3)
CO2: 28 mmol/L (ref 22–32)
CREATININE: 1.64 mg/dL — AB (ref 0.61–1.24)
Chloride: 99 mmol/L — ABNORMAL LOW (ref 101–111)
GFR calc non Af Amer: 35 mL/min — ABNORMAL LOW (ref >60)
GFR, EST AFRICAN AMERICAN: 40 mL/min — AB (ref >60)
Glucose, Bld: 138 mg/dL — ABNORMAL HIGH (ref 65–99)
Potassium: 3.8 mmol/L (ref 3.5–5.1)
Sodium: 136 mmol/L (ref 135–145)
TOTAL PROTEIN: 5.5 g/dL — AB (ref 6.5–8.1)

## 2017-01-03 LAB — BPAM RBC
BLOOD PRODUCT EXPIRATION DATE: 201812042359
ISSUE DATE / TIME: 201811081013
Unit Type and Rh: 5100

## 2017-01-03 LAB — PROTIME-INR
INR: 1.17
PROTHROMBIN TIME: 14.8 s (ref 11.4–15.2)

## 2017-01-03 LAB — HEPARIN LEVEL (UNFRACTIONATED): Heparin Unfractionated: 0.29 IU/mL — ABNORMAL LOW (ref 0.30–0.70)

## 2017-01-03 MED ORDER — WARFARIN SODIUM 3 MG PO TABS
3.0000 mg | ORAL_TABLET | ORAL | Status: AC
  Administered 2017-01-03: 3 mg via ORAL
  Filled 2017-01-03: qty 1

## 2017-01-03 MED ORDER — HYDROCODONE-ACETAMINOPHEN 5-325 MG PO TABS: 1.0000 | ORAL_TABLET | ORAL | 0 refills | Status: DC | PRN

## 2017-01-03 NOTE — Clinical Social Work Placement (Signed)
   CLINICAL SOCIAL WORK PLACEMENT  NOTE  Date:  01/03/2017  Patient Details  Name: Ronald [name] MRN: [id number] Date of Birth: [birth year]  Clinical Social Work is seeking post-discharge placement for this patient at the [place] level of care (*CSW will initial, date and re-position this form in  chart as items are completed):  Yes   Patient/family provided with [place] Work Department's list of facilities offering this level of care within the geographic area requested by the patient (or if unable, by the patient's family).  Yes   Patient/family informed of their freedom to choose among providers that offer the needed level of care, that participate in Medicare, Medicaid or managed care program needed by the patient, have an available bed and are willing to accept the patient.  Yes   Patient/family informed of [facility]'s ownership interest in [facility] and [facility], as well as of the fact that they are under no obligation to receive care at these facilities.  PASRR submitted to EDS on       PASRR number received on 12/29/16     Existing PASRR number confirmed on 12/30/16     FL2 transmitted to all facilities in geographic area requested by pt/family on       FL2 transmitted to all facilities within larger geographic area on       Patient informed that his/her managed care company has contracts with or will negotiate with certain facilities, including the following:        Yes   Patient/family informed of bed offers received.  Patient chooses bed at [facility] recommends and patient chooses bed at      Patient to be transferred to [facility] on 01/03/17.  Patient to be transferred to facility by [facility]     Patient family notified on 01/03/17 of transfer.  Name of family member notified:  daughter at bedside     PHYSICIAN       Additional Comment:     _______________________________________________ Normajean Baxter, LCSW 01/03/2017, 10:08 AM

## 2017-01-03 NOTE — Discharge Summary (Signed)
Report called and give to [place] at [facility]. Discharge paperwork and prescriptions given to [facility]. [facility] to transport to facility. Pt belongings in bag and sent with [facility].

## 2017-01-03 NOTE — Social Work (Signed)
Clinical Social Worker facilitated patient discharge including contacting patient family and facility to confirm patient discharge plans.  Clinical information faxed to facility and family agreeable with plan.    CSW arranged ambulance transport via [facility] to [facility].    RN to call [phone] to give report prior to discharge.   Clinical Social Worker will sign off for now as social work intervention is no longer needed. Please consult [place] again if new need arises.  Elissa Hefty, LCSW Clinical Social Worker [phone]

## 2017-01-03 NOTE — Care Management Note (Signed)
Case Management Note  Patient Details  Name: KHUP SAPIA MRN: [id number] Date of Birth: 03/26/1922  Subjective/Objective:                 Patient with order to discharge to [facility] (SNF). SNF discharge facilitated through [place] ([place]). Please refer to [facility] notes for disposition plan and direct questions CSW on call accordingly. CM signing off   Action/Plan:   Expected Discharge Date:  01/03/17               Expected Discharge Plan:  [place]  In-House Referral:  Clinical Social Work  Discharge planning Services  CM Consult  Post Acute Care Choice:    Choice offered to:     DME Arranged:    DME Agency:     HH Arranged:    [place] Agency:     Status of Service:  Completed, signed off  If discussed at [employer], dates discussed:    Additional Comments:  Carles Collet, RN 01/03/2017, 8:37 AM

## 2017-01-03 NOTE — Social Work (Signed)
CSW was advised by [name], daughter, that [facility] SNF has offered a bed.    CSW called Candace at [facility] to confirm bed offer.  CSW called admission at [facility] that family selected another SNF.  CSW will f/u with [facility] on auth.  Elissa Hefty, LCSW Clinical Social Worker [phone]

## 2017-01-03 NOTE — Social Work (Signed)
CSW called [facility] to follow up on [employer].  CSW will f/u as discharging today.  Elissa Hefty, LCSW Clinical Social Worker [phone]

## 2017-01-03 NOTE — Discharge Summary (Signed)
Patient ID: TUNIS GENTLE MRN: [id number] DOB/AGE: 81-May-1924 83 y.o.  Admit date: 12/26/2016 Discharge date:   Primary Diagnosis: Left proximal humerus fracture Admission Diagnoses:  Past Medical History:  Diagnosis Date  . AS (aortic stenosis)    bovine aortic valve replacement 01/07/11  . Bladder cancer [facility])    Bladder Cancer local  . Blood transfusion    w/hip operation  . Cellulitis of left lower extremity   . Chronic atrial fibrillation ([place])   . Chronic diastolic congestive heart failure ([place])   . Claudication in peripheral vascular disease ([place]) 06/17/2011  . COPD (chronic obstructive pulmonary disease) ([place])   . Depression wife died 4 years ago.    Marland Kitchen History of stomach ulcers ~ 1951  . HTN (hypertension)   . Neuropathy due to secondary diabetes ([place])   . Normal coronary arteries Sept 2012  . Peripheral vascular disease ([place]) very poor circulation legs and feet ... stents right and left legs... done in dr j. Gwenlyn Found 's office.   . Pneumonia 07/25/11   left  . Recurrent upper respiratory infection (URI)    sinusitis  . Renal artery stenosis ([place]) 2006   renal artery stent  . S/P angioplasty with stent, diamond back rotational athrectomy Prox. Rt. SFA 06/17/2011 06/17/2011  . Shortness of breath   . Thrombocytopenia due to drugs    seen by Dr Inda Merlin plts 114000 no rx  . Type II diabetes mellitus ([place])    Discharge Diagnoses:   Principal Problem:   Closed fracture of left proximal humerus Active Problems:   Diabetes mellitus with complication (HCC)   Essential hypertension   Thrombocytopenia (HCC)   Chronic atrial fibrillation (HCC)   COPD (chronic obstructive pulmonary disease) (HCC)   Chronic diastolic heart failure (HCC)   Chronic renal insufficiency, stage IV (severe) (HCC)   Anemia  Estimated body mass index is 24.98 kg/m as calculated from the following:   Height as of this encounter: 5' 10.98" (1.803 m).   Weight as of this encounter: 179 lb (81.2  kg).  Procedure:  Procedure(s) (LRB): LEFT REVERSE SHOULDER ARTHROPLASTY (Left)   Consults: Internal medicine  HPI: Ronald [name] is a right-handed 83 year old gentleman who had a fall last week and sustained a left proximal humerus fracture.  He was on high dose anticoagulations for chronic atrial fibrillation.  He developed a immediate left shoulder hematoma as well as his baseline elevated INR.  We delayed surgical fixation so that his labs could normalize and his anemia could stabilize.  He unfortunately left the hospital AMA and needed to return to be optimized before surgery.  On his return he came back on my service.  The internal medicine team gave [place] great assistance during his first hospitalization and was asked for assistance on the second 1.  He presented for surgery on Friday however was unable to go due to elevated INR and low red blood cell count.  His surgery was delayed until this Tuesday. Laboratory Data: Admission on 12/26/2016  No results displayed because visit has over 200 results.    Admission on 12/23/2016, Discharged on 12/25/2016  Component Date Value Ref Range Status  . Glucose-Capillary 12/23/2016 248* 65 - 99 mg/dL Final  . Prothrombin Time 12/23/2016 23.7* 11.4 - 15.2 seconds Final  . INR 12/23/2016 2.14   Final  . WBC 12/23/2016 15.2* 4.0 - 10.5 K/uL Final  . RBC 12/23/2016 2.72* 4.22 - 5.81 MIL/uL Final  . Hemoglobin 12/23/2016 8.0* 13.0 - 17.0 g/dL Final  .  HCT 12/23/2016 25.3* 39.0 - 52.0 % Final  . MCV 12/23/2016 93.0  78.0 - 100.0 fL Final  . MCH 12/23/2016 29.4  26.0 - 34.0 pg Final  . MCHC 12/23/2016 31.6  30.0 - 36.0 g/dL Final  . RDW 12/23/2016 19.7* 11.5 - 15.5 % Final  . Platelets 12/23/2016 104* 150 - 400 K/uL Final   Comment: REPEATED TO VERIFY SPECIMEN CHECKED FOR CLOTS PLATELET COUNT CONFIRMED BY SMEAR   . Sodium 12/23/2016 138  135 - 145 mmol/L Final  . Potassium 12/23/2016 3.4* 3.5 - 5.1 mmol/L Final  . Chloride 12/23/2016 104  101 -  111 mmol/L Final  . CO2 12/23/2016 26  22 - 32 mmol/L Final  . Glucose, Bld 12/23/2016 257* 65 - 99 mg/dL Final  . BUN 12/23/2016 20  6 - 20 mg/dL Final  . Creatinine, Ser 12/23/2016 1.52* 0.61 - 1.24 mg/dL Final  . Calcium 12/23/2016 8.6* 8.9 - 10.3 mg/dL Final  . GFR calc non Af Amer 12/23/2016 38* >60 mL/min Final  . GFR calc Af Amer 12/23/2016 44* >60 mL/min Final   Comment: (NOTE) The eGFR has been calculated using the CKD EPI equation. This calculation has not been validated in all clinical situations. eGFR's persistently <60 mL/min signify possible Chronic Kidney Disease.   . Anion gap 12/23/2016 8  5 - 15 Final  . Hgb A1c MFr Bld 12/23/2016 6.2* 4.8 - 5.6 % Final   Comment: (NOTE) Pre diabetes:          5.7%-6.4% Diabetes:              >6.4% Glycemic control for   <7.0% adults with diabetes   . Mean Plasma Glucose 12/23/2016 131.24  mg/dL Final  . ABO/RH(D) 12/23/2016 O POS   Final  . Antibody Screen 12/23/2016 NEG   Final  . Sample Expiration 12/23/2016 12/26/2016   Final  . Unit Number 12/23/2016 [id number]   Final  . Blood Component Type 12/23/2016 RED CELLS,LR   Final  . Unit division 12/23/2016 00   Final  . Status of Unit 12/23/2016 ISSUED,FINAL   Final  . Transfusion Status 12/23/2016 OK TO TRANSFUSE   Final  . Crossmatch Result 12/23/2016 Compatible   Final  . Prothrombin Time 12/24/2016 28.9* 11.4 - 15.2 seconds Final  . INR 12/24/2016 2.75   Final  . WBC 12/24/2016 8.2  4.0 - 10.5 K/uL Final  . RBC 12/24/2016 2.22* 4.22 - 5.81 MIL/uL Final  . Hemoglobin 12/24/2016 6.5* 13.0 - 17.0 g/dL Final   Comment: REPEATED TO VERIFY CRITICAL RESULT CALLED TO, READ BACK BY AND VERIFIED WITH: GINGER MADISON,RN AT 0753 12/24/16 BY ZBEECH.   Marland Kitchen HCT 12/24/2016 20.5* 39.0 - 52.0 % Final  . MCV 12/24/2016 92.3  78.0 - 100.0 fL Final  . MCH 12/24/2016 29.3  26.0 - 34.0 pg Final  . MCHC 12/24/2016 31.7  30.0 - 36.0 g/dL Final  . RDW 12/24/2016 20.1* 11.5 - 15.5 % Final  .  Platelets 12/24/2016 98* 150 - 400 K/uL Final   CONSISTENT WITH PREVIOUS RESULT  . Sodium 12/24/2016 138  135 - 145 mmol/L Final  . Potassium 12/24/2016 3.0* 3.5 - 5.1 mmol/L Final  . Chloride 12/24/2016 105  101 - 111 mmol/L Final  . CO2 12/24/2016 25  22 - 32 mmol/L Final  . Glucose, Bld 12/24/2016 174* 65 - 99 mg/dL Final  . BUN 12/24/2016 19  6 - 20 mg/dL Final  . Creatinine, Ser 12/24/2016 1.50* 0.61 - 1.24  mg/dL Final  . Calcium 12/24/2016 8.3* 8.9 - 10.3 mg/dL Final  . GFR calc non Af Amer 12/24/2016 39* >60 mL/min Final  . GFR calc Af Amer 12/24/2016 45* >60 mL/min Final   Comment: (NOTE) The eGFR has been calculated using the CKD EPI equation. This calculation has not been validated in all clinical situations. eGFR's persistently <60 mL/min signify possible Chronic Kidney Disease.   . Anion gap 12/24/2016 8  5 - 15 Final  . Glucose-Capillary 12/23/2016 224* 65 - 99 mg/dL Final  . Glucose-Capillary 12/23/2016 250* 65 - 99 mg/dL Final  . Order Confirmation 12/24/2016 ORDER PROCESSED BY BLOOD BANK   Final  . ISSUE DATE / TIME 12/23/2016 [id number]   Final  . Blood Product Unit Number 12/23/2016 [id number]   Final  . PRODUCT CODE 12/23/2016 [id number]   Final  . Unit Type and Rh 12/23/2016 5100   Final  . Blood Product Expiration Date 12/23/2016 [id number]   Final  . Glucose-Capillary 12/24/2016 184* 65 - 99 mg/dL Final  . Hemoglobin 12/24/2016 8.4* 13.0 - 17.0 g/dL Final   Comment: REPEATED TO VERIFY POST TRANSFUSION SPECIMEN   . HCT 12/24/2016 25.4* 39.0 - 52.0 % Final  . Glucose-Capillary 12/24/2016 221* 65 - 99 mg/dL Final  . Comment 1 12/24/2016 Notify RN   Final  . Comment 2 12/24/2016 Document in Chart   Final  . Glucose-Capillary 12/24/2016 184* 65 - 99 mg/dL Final  . Glucose-Capillary 12/25/2016 155* 65 - 99 mg/dL Final  . Glucose-Capillary 12/25/2016 275* 65 - 99 mg/dL Final     X-Rays:Ct Shoulder Left Wo Contrast  Result Date: 12/25/2016 CLINICAL DATA:   The patient suffered a proximal left humerus fracture in a fall 12/23/2016. Initial encounter. EXAM: CT OF THE UPPER LEFT EXTREMITY WITHOUT CONTRAST TECHNIQUE: Multidetector CT imaging of the upper left extremity was performed according to the standard protocol. COMPARISON:  Plain films left shoulder 12/23/2016. FINDINGS: Bones/Joint/Cartilage The patient has a comminuted fracture of the proximal left humerus. The fracture is oblique in orientation extending from the anterior cortex at the level of the surgical neck in a posterior and inferior orientation through the proximal diaphysis of the humerus. There are 4 main fracture fragments. Largest floating fragment posterior cortex measures approximately 2 cm transverse by 4.5 cm craniocaudal. The diaphysis of the humerus demonstrates approximately 1 shaft width anterior displacement and foreshortening of approximately 3 cm. The humeral head is rotated medially due to retraction by the supraspinatus. The humeral head is located. Moderate acromioclavicular osteoarthritis is seen. Type 2 acromion is noted. Ligaments Suboptimally assessed by CT. Muscles and Tendons The rotator cuff appears intact.  No muscle atrophy is identified. Soft tissues Soft tissue edema and contusion are seen about the patient's fracture. IMPRESSION: Comminuted fracture of the proximal humerus is impacted and displaced as described above. The fracture does not involve the greater or lesser tuberosities. Electronically Signed   By: Inge Rise M.D.   On: 12/25/2016 09:39   Dg Shoulder Left  Result Date: 12/23/2016 CLINICAL DATA:  Fall, left shoulder deformity EXAM: LEFT SHOULDER - 2+ VIEW COMPARISON:  None. FINDINGS: Comminuted left humeral neck fracture, with 3 dominant parts. Associated anterior displacement of the humeral shaft. Visualized soft tissues are within normal limits. Visualized left lung is clear. IMPRESSION: Comminuted three-part left humeral neck fracture, as above.  Electronically Signed   By: Julian Hy M.D.   On: 12/23/2016 13:30   Dg Shoulder Left Port  Result Date: 12/31/2016 CLINICAL DATA:  S/P left reverse shoulder arthroplasty. EXAM: LEFT SHOULDER - 1 VIEW COMPARISON:  12/25/2016 FINDINGS: The patient has undergone left reverse shoulder arthroplasty. The humeral head component overlies the glenoid fossa component on both projections. No interval fracture or subluxation. The left lung apex is unremarkable. Note is made of left atrial appendage clip. IMPRESSION: No apparent adverse features following left reverse shoulder arthroplasty. Electronically Signed   By: Nolon Nations M.D.   On: 12/31/2016 19:07    EKG: Orders placed or performed during the hospital encounter of 12/26/16  . EKG 12-Lead  . EKG 12-Lead  . EKG 12-Lead  . EKG 12-Lead  . EKG 12-Lead  . EKG 12-Lead  . EKG 12-Lead  . EKG 12-Lead     Hospital Course: Ronald [name] is a 83 y.o. who was admitted to Hospital.  Prior to surgery he was medically optimized by the internal medicine team with fluid resuscitation, red blood cell transfusion, and allowing the Coumadin levels to drift down.  He had no medical complications prior to surgery.  They were brought to the operating room on 12/31/2016 and underwent Procedure(s): LEFT REVERSE SHOULDER ARTHROPLASTY.  Patient tolerated the procedure well and was later transferred to the recovery room and then to the orthopaedic floor for postoperative care.  They were given PO and IV analgesics for pain control following their surgery.  They were given 24 hours of postoperative antibiotics of  Anti-infectives (From admission, onward)   Start     Dose/Rate Route Frequency Ordered Stop   12/31/16 1830  ceFAZolin (ANCEF) IVPB 1 g/50 mL premix     1 g 100 mL/hr over 30 Minutes Intravenous Every 6 hours 12/31/16 1828 01/01/17 1201   12/27/16 0600  ceFAZolin (ANCEF) IVPB 2g/100 mL premix     2 g 200 mL/hr over 30 Minutes Intravenous On call to  O.R. 12/26/16 1948 12/28/16 0559     and started on DVT prophylaxis in the form of Heparin bridge to Coumadin.   PT and OT were ordered for total joint protocol.  Postoperatively he did require 2 subsequent units of red blood cells.  He continued to do well on the floor.  His comp located medical comorbidities were at their baseline.  He showed no signs of fluid overload or worsening congestive heart failure or chronic kidney disease.  He was indicated for discharge to a skilled nursing facility.  Once a bed was available we provided discharge paperwork for that endeavor.  Diet: Diabetic diet Activity:NWB Follow-up:in 2 weeks Disposition - Skilled nursing facility Discharged Condition: good   Discharge Instructions    Call MD / Call 911   Complete by:  As directed    If you experience chest pain or shortness of breath, CALL 911 and be transported to the hospital emergency room.  If you develope a fever above 101 F, pus (white drainage) or increased drainage or redness at the wound, or calf pain, call your surgeon's office.   Constipation Prevention   Complete by:  As directed    Drink plenty of fluids.  Prune juice may be helpful.  You may use a stool softener, such as Colace (over the counter) 100 mg twice a day.  Use MiraLax (over the counter) for constipation as needed.   Diet - low sodium heart healthy   Complete by:  As directed    Increase activity slowly as tolerated   Complete by:  As directed      Allergies as of 01/03/2017  Reactions   Aspirin Anaphylaxis, Shortness Of Breath, Rash   "broke out in white welts; red blotches neck and face; windpipe closing; ~ 1962"      Medication List    TAKE these medications   alendronate 70 MG tablet Commonly known as:  FOSAMAX Take 70 mg by mouth See admin instructions. Take every other sunday   ANORO ELLIPTA 62.5-25 MCG/INH Aepb Generic drug:  umeclidinium-vilanterol Inhale 1 puff into the lungs daily.   benazepril 10 MG  tablet Commonly known as:  LOTENSIN Take 10 mg by mouth daily.   buPROPion 150 MG 24 hr tablet Commonly known as:  WELLBUTRIN XL Take 150 mg by mouth every morning.   doxazosin 4 MG tablet Commonly known as:  CARDURA Take 4 mg by mouth every evening.   ferrous sulfate 325 (65 FE) MG EC tablet Take 1 tablet (325 mg total) by mouth 2 (two) times daily.   finasteride 5 MG tablet Commonly known as:  PROSCAR Take 5 mg by mouth at bedtime.   fluticasone 50 MCG/ACT nasal spray Commonly known as:  FLONASE Place 1 spray into both nostrils 2 (two) times daily.   furosemide 40 MG tablet Commonly known as:  LASIX Take 1 tablet (40 mg total) by mouth daily. What changed:  when to take this   glyBURIDE 5 MG tablet Commonly known as:  DIABETA Take 2.5-10 mg by mouth 2 (two) times daily with a meal. 2 tablets in the morning and 1/2 tablet in the evening   HYDROcodone-acetaminophen 5-325 MG tablet Commonly known as:  NORCO/VICODIN Take 1-2 tablets by mouth every 4 (four) hours as needed for moderate pain. What changed:  Another medication with the same name was added. Make sure you understand how and when to take each.   HYDROcodone-acetaminophen 5-325 MG tablet Commonly known as:  NORCO/VICODIN Take 1-2 tablets every 4 (four) hours as needed by mouth (breakthrough pain). What changed:  You were already taking a medication with the same name, and this prescription was added. Make sure you understand how and when to take each.   Insulin Glargine 100 UNIT/ML Solostar Pen Commonly known as:  LANTUS Inject 10 Units into the skin daily.   loratadine 10 MG tablet Commonly known as:  CLARITIN Take 10 mg by mouth daily.   metoprolol succinate 50 MG 24 hr tablet Commonly known as:  TOPROL-XL Take 50 mg by mouth 2 (two) times daily. Take with or immediately following a meal.   OVER THE COUNTER MEDICATION Take 1 capsule by mouth 2 (two) times daily. "Higher Thoughts"   polyethylene  glycol packet Commonly known as:  MIRALAX / GLYCOLAX Take 17 g by mouth daily.   potassium chloride SA 20 MEQ tablet Commonly known as:  K-DUR,KLOR-CON Take 20 mEq by mouth at bedtime.   senna-docusate 8.6-50 MG tablet Commonly known as:  Senokot-S Take 1 tablet by mouth at bedtime.   simvastatin 20 MG tablet Commonly known as:  ZOCOR Take 20 mg by mouth every evening.   traMADol 50 MG tablet Commonly known as:  ULTRAM Take 50 mg by mouth every evening.   warfarin 2 MG tablet Commonly known as:  COUMADIN Take 2-3 mg by mouth See admin instructions. Take '2mg'$  by mouth on Monday, Wednesday, Friday, Saturday and Sunday; Take 3 mg by mouth on Tuesday and Thursday        Signed: Geralynn Rile, MD Orthopaedic Surgery 01/03/2017, 7:22 AM

## 2017-01-03 NOTE — Progress Notes (Signed)
PROGRESS NOTE    Ronald [name]  OFB:[id number] DOB: 12-21-1922 DOA: 12/26/2016 PCP: [name], MD   Brief Narrative:  Ronald [name] is a 83 y.o. male with a history of AS s/p bovine AVR, chronic diastolic CHF, CAD, COPD, Depression, Hypertension, Thrombocytopenia, Diabetes Mellitus. He presented with left shoulder pain found to have a humeral fracture. He left against recommendations and has returned for his surgery, which was unfortunately been cancelled secondary to anemia and rescheduled 12/31/16. Patient is POD 3 and Pain is better controlled today. Coumadin restarted yesterday and Pharmacy to dose but unfortunately did not get it yesterday. Patient being Discharged today and recommend going back on Home Coumadin dose and checking PT-INR the next 5 days to make adjustments to dosing at SNF.   Assessment & Plan:   Principal Problem:   Closed fracture of left proximal humerus Active Problems:   Diabetes mellitus with complication (HCC)   Essential hypertension   Thrombocytopenia (HCC)   Chronic atrial fibrillation (HCC)   COPD (chronic obstructive pulmonary disease) (HCC)   Chronic diastolic heart failure (HCC)   Chronic renal insufficiency, stage IV (severe) (HCC)   Anemia  Comminuted Left Humeral Fracture s/p Reverse Should Arthoplasty POD 3 -Patient was previously admitted for this fracture but left against medical advice to attend to some home affairs. Underwent Surgery 11/6 -Analgesics prn; Added Dilaudid 0.5 mg IV q4hprn as pain was still very Uncontrolled -Appreciate Orthopedic Surgery Management  -Per Ortho Recc's: C/w NWB to LUE and maintain abduction sling. Per Dr. Stann Mainland ok to use left arm and hand for ADL's as well as with arm by side  Normocytic Anemia in the setting of Acute Blood Loss Anemia -C/w Ferrous Sulfate 325 mg po BID -S/p 4 units of pRBC's since 10/30 -Hb/Hct went from 9.6/30.0 -> 9.3/29.2 -> 8.2/25.5 -> 8.0/24.4 -> 9.3/28.6 after last unit of  blood -Repeat CBC in AM   Aortic stenosis -Status post AVR in 2012. -Held Coumadin until after surgery -Continued Heparin and restarted Warfarin but unfortunately never given last night; Restart today -Appreciate Pharmacy to Dose  Chronic Atrial Fibrillation -Rate controlled. Coumadin was held secondary to anticipated surgery. -Continue Metoprolol.  -Continued Heparin gtt and restarted Warfarin but never given last night; Start today and Watch for recurrent bleeding -Do not recommend bridging with Lovenox given High risk of bleeding  -INR was 1.17 -[place]cho to dose -Follow up INR at SNF  COPD -Stable. -C/w Anoro Ellipta 62.5-25 mcg/INH 1 puff   Chronic Diastolic Heart Failure -Currently Euvolemic. -C/w Metoprolol 50 mg po BID -C/w Furosemide 40 mg po BID along with Potassium Supplementation 20 mEQ -Strict I's/O's, Daily Weights  -Patient is -7.839 Liters  CKD stage 4 -BUN/Cr went from 31/1.47 -> 33/1.55 -> 37/1.68 -> 39/1.64 -Continue to Monitor and Repeat CMP in AM at SNF  Chronic Thrombocytopenia, improved -Platelet Count went from 144 -> 136 -> 139 -> 156 -Continue to Monitor for S/Sx of Bleeding -Repeat CBC in AM  Leukocytosis -Had a Fever yesterday (likley post-op); Afebrile today  -WBC went from 10.3 -> 13.1 -> 11.8 -> 13.4 -Likely Reactive from Surgery and Pain -Continue to Monitor for S/Sx of Infection  -Repeat CBC in AM  Hypokalemia, improved -K+ was stable at 3.8 today  -Continue to Monitor and Replete as Necessary -Repeat CMP in AM  Hx of Bladder Cancer -C/w Doxazosin 4 mg po qHS and Finasteride 5 mg po Daily  -Follow up as an outpatient   Depression -C/w Home Wellbutrin  150 mg po Daily   DVT prophylaxis: Anticoagulated with Heparin gtt will restart Home AC with Coumadin  Code Status: FULL CODE Family Communication: No Family present at bedside Disposition Plan: Patient being D/C'd to SNF today per Primary  Consultants:    TRH  Procedures:  Left Proximal Humerus Fracture s/p Reverse Shoulder Arthoplasty on 12/31/16   Antimicrobials: Anti-infectives (From admission, onward)   Start     Dose/Rate Route Frequency Ordered Stop   12/31/16 1830  ceFAZolin (ANCEF) IVPB 1 g/50 mL premix     1 g 100 mL/hr over 30 Minutes Intravenous Every 6 hours 12/31/16 1828 01/01/17 1201   12/27/16 0600  ceFAZolin (ANCEF) IVPB 2g/100 mL premix     2 g 200 mL/hr over 30 Minutes Intravenous On call to O.R. 12/26/16 1948 12/28/16 0559     Subjective: Seen and examined and felt ok but states his Arm was hurting after it was manipulated by PT. No CP or SOB. No other complaints or concerns.   Objective: Vitals:   01/02/17 1914 01/03/17 0400 01/03/17 0901 01/03/17 1500  BP: 131/61 137/71  (!) 156/51  Pulse: 89 84 96 87  Resp: 14 16 16 16   Temp: 98.9 F (37.2 C)   99.1 F (37.3 C)  TempSrc: Axillary Oral  Oral  SpO2: 96% 98% 96% 93%  Weight:      Height:        Intake/Output Summary (Last 24 hours) at 01/03/2017 1803 Last data filed at 01/03/2017 1236 Gross per 24 hour  Intake -  Output 3550 ml  Net -3550 ml   Filed Weights   12/26/16 1739 12/31/16 1246  Weight: 81.6 kg (179 lb 14.3 oz) 81.2 kg (179 lb)   Examination: Physical Exam:  Constitutional: Elderly Caucasian male in NAD Eyes: Sclerae anicteric. Lids normal ENMT: Patient is hard of hearing. External Ears and nose appear normal. MMM Neck: Supple with no JVD. Respiratory: Diminished to auscultation bilaterally with no appreciable wheezing/rales/rhonchi Cardiovascular: Irregularly Irregular, 3/6 Murmur. S1 and S2 auscultated No extremity edema Abdomen: Soft, NT, ND. Bowel sounds present GU: Deferred. Has a condom cath in place Musculoskeletal: No clubbing or cyanosis. Left shoulder is stablized Skin: Warm and Dry. Has some Left upper extremity bruising Neurologic: CN 2-12 grossly intact. No appreciable focal deficits Psychiatric: Normal judgement and  insight. Awake and Alert.   Data Reviewed: I have personally reviewed following labs and imaging studies  CBC: Recent Labs  Lab 12/30/16 0507 12/31/16 0609 12/31/16 1848 01/01/17 0407 01/02/17 0509 01/03/17 0235  WBC 10.6* 10.3  --  13.1* 11.8* 13.4*  NEUTROABS  --   --   --   --   --  10.3*  HGB 9.7* 9.6* 9.3* 8.2* 8.0* 9.3*  HCT 29.9* 30.0* 29.2* 25.5* 24.4* 28.6*  MCV 95.5 97.1  --  97.3 98.0 97.9  PLT 139* 144*  --  136* 139* 606   Basic Metabolic Panel: Recent Labs  Lab 12/30/16 0725 12/31/16 1100 01/01/17 0407 01/02/17 0509 01/03/17 0235  NA 136 139 138 135 136  K 3.3* 3.7 4.0 3.5 3.8  CL 99* 100* 100* 96* 99*  CO2 29 30 28 31 28   GLUCOSE 112* 141* 149* 198* 138*  BUN 32* 31* 33* 37* 39*  CREATININE 1.55* 1.47* 1.55* 1.68* 1.64*  CALCIUM 8.3* 8.6* 8.2* 8.3* 8.5*  MG  --   --   --  2.0  --   PHOS  --   --   --  3.0  --  GFR: Estimated Creatinine Clearance: 30.6 mL/min (A) (by C-G formula based on SCr of 1.64 mg/dL (H)). Liver Function Tests: Recent Labs  Lab 01/03/17 0235  AST 35  ALT 16*  ALKPHOS 91  BILITOT 2.0*  PROT 5.5*  ALBUMIN 2.3*   No results for input(s): LIPASE, AMYLASE in the last 168 hours. No results for input(s): AMMONIA in the last 168 hours. Coagulation Profile: Recent Labs  Lab 12/30/16 0507 12/31/16 0609 01/01/17 0407 01/02/17 0509 01/03/17 0817  INR 1.20 1.24 1.29 1.38 1.17   Cardiac Enzymes: No results for input(s): CKTOTAL, CKMB, CKMBINDEX, TROPONINI in the last 168 hours. BNP (last 3 results) No results for input(s): PROBNP in the last 8760 hours. HbA1C: No results for input(s): HGBA1C in the last 72 hours. CBG: Recent Labs  Lab 01/02/17 2314 01/03/17 0404 01/03/17 0658 01/03/17 1234 01/03/17 1641  GLUCAP 159* 140* 219* 212* 209*   Lipid Profile: No results for input(s): CHOL, HDL, LDLCALC, TRIG, CHOLHDL, LDLDIRECT in the last 72 hours. Thyroid Function Tests: No results for input(s): TSH, T4TOTAL,  FREET4, T3FREE, THYROIDAB in the last 72 hours. Anemia Panel: No results for input(s): VITAMINB12, FOLATE, FERRITIN, TIBC, IRON, RETICCTPCT in the last 72 hours. Sepsis Labs: No results for input(s): PROCALCITON, LATICACIDVEN in the last 168 hours.  Recent Results (from the past 240 hour(s))  Surgical PCR screen     Status: None   Collection Time: 12/26/16 11:42 PM  Result Value Ref Range Status   MRSA, PCR NEGATIVE NEGATIVE Final   Staphylococcus aureus NEGATIVE NEGATIVE Final    Comment: (NOTE) The Xpert SA Assay (FDA approved for NASAL specimens in patients 64 years of age and older), is one component of a comprehensive surveillance program. It is not intended to diagnose infection nor to guide or monitor treatment.     Radiology Studies: No results found. Scheduled Meds: . buPROPion  150 mg Oral Daily  . chlorhexidine  60 mL Topical Once  . doxazosin  4 mg Oral QPM  . ferrous sulfate  325 mg Oral BID  . finasteride  5 mg Oral Daily  . fluticasone  1 spray Each Nare BID  . furosemide  40 mg Oral BID  . insulin aspart  0-9 Units Subcutaneous Q4H  . insulin glargine  12 Units Subcutaneous Daily  . loratadine  10 mg Oral Daily  . metoprolol succinate  50 mg Oral BID  . potassium chloride SA  20 mEq Oral Daily  . senna-docusate  1 tablet Oral QHS  . simvastatin  20 mg Oral QPM  . umeclidinium-vilanterol  1 puff Inhalation Daily  . Warfarin - Pharmacist Dosing Inpatient   Does not apply q1800   Continuous Infusions: . heparin 1,700 Units/hr (01/03/17 1114)  . lactated ringers 10 mL/hr at 12/31/16 1247  . methocarbamol (ROBAXIN)  IV      LOS: 8 days   Kerney Elbe, DO [facility] Pager [phone]  If 7PM-7AM, please contact night-coverage [email or web address] Password [email or web address] 01/03/2017, 6:03 PM

## 2017-01-03 NOTE — Discharge Instructions (Signed)
Orthopedic discharge instructions:  - Maintain sling to left arm at all times unless performing activities of daily living.  He may remove the sling to perform those activities as well as elbow and hand wrist range of motion. -No weightbearing to the left arm. -Maintain postoperative dressing at the shoulder until your follow-up appointment. -The Ace bandage to the forearm may be removed daily to inspect the left arm edema. -You may resume your warfarin as prescribed by the internal medicine doctors. -You may shower with the postoperative bandage in place.  Do not submerge underwater. -Return to see Dr. Stann Mainland in 2 weeks for a wound check.

## 2017-01-03 NOTE — Progress Notes (Signed)
ANTICOAGULATION CONSULT NOTE - Follow Up Consult  Pharmacy Consult:  Heparin Indication: atrial fibrillation  Allergies  Allergen Reactions  . Aspirin Anaphylaxis, Shortness Of Breath and Rash    "broke out in white welts; red blotches neck and face; windpipe closing; ~ 1962"    Patient Measurements: Height: 5' 10.98" (180.3 cm) Weight: 179 lb (81.2 kg) IBW/kg (Calculated) : 75.26 Heparin Dosing Weight: 81 kg  Assessment: 89 YOM on Coumadin 2mg  daily exc for 3mg  on Tues/Thurs PTA for Afib >> Heparin for hip surgery 11/6, warfarin resumed 11/8 - dose unfortunately not given due to entry error. Heparin level very slightly subtherapeutic at 0.29 - no issues with IV line or bleeding per RN. INR down to 1.17. CBC improved. Pt had some bleeding into arm on admit, no further bleeding per discussion with MD.  Goal of Therapy:  Heparin level 0.3-0.7 units/ml  INR 2-3 Monitor platelets by anticoagulation protocol: Yes   Plan:  Increase heparin gtt slightly to 1700 units/hr to keep in range Warfarin 3mg  PO x1 Daily heparin level/INR/CBC Monitor for s/sx bleeding Pt to d/c to SNF today on home dose per MD   Elicia Lamp, PharmD, BCPS Clinical Pharmacist Rx Phone # for today: [phone] After 3:30PM, please call Main Rx: [phone] 01/03/2017 8:46 AM

## 2017-01-03 NOTE — Care Management Important Message (Signed)
Important Message  Patient Details  Name: Ronald [name] MRN: [id number] Date of Birth: 03-Jul-1922   Medicare Important Message Given:  Yes    Blanka Rockholt Abena 01/03/2017, 10:09 AM

## 2017-01-03 NOTE — Social Work (Signed)
CSW received call from [place] at [facility] advising that patient approved for 5 days at [facility] and then clinicals would need to be sent to insurance company. [id number].  Pt transitioning to [place] today.  Elissa Hefty, LCSW Clinical Social Worker [phone]

## 2017-01-07 DIAGNOSIS — I10 Essential (primary) hypertension: Secondary | ICD-10-CM | POA: Diagnosis not present

## 2017-01-07 DIAGNOSIS — R4182 Altered mental status, unspecified: Secondary | ICD-10-CM | POA: Diagnosis not present

## 2017-01-07 DIAGNOSIS — I4891 Unspecified atrial fibrillation: Secondary | ICD-10-CM | POA: Diagnosis not present

## 2017-01-07 DIAGNOSIS — S42302D Unspecified fracture of shaft of humerus, left arm, subsequent encounter for fracture with routine healing: Secondary | ICD-10-CM | POA: Diagnosis not present

## 2017-01-27 DIAGNOSIS — Z9181 History of falling: Secondary | ICD-10-CM | POA: Diagnosis not present

## 2017-01-27 DIAGNOSIS — R1312 Dysphagia, oropharyngeal phase: Secondary | ICD-10-CM | POA: Diagnosis not present

## 2017-01-27 DIAGNOSIS — R41841 Cognitive communication deficit: Secondary | ICD-10-CM | POA: Diagnosis not present

## 2017-01-27 DIAGNOSIS — J189 Pneumonia, unspecified organism: Secondary | ICD-10-CM | POA: Diagnosis not present

## 2017-01-27 DIAGNOSIS — R262 Difficulty in walking, not elsewhere classified: Secondary | ICD-10-CM | POA: Diagnosis not present

## 2017-01-27 DIAGNOSIS — S42202D Unspecified fracture of upper end of left humerus, subsequent encounter for fracture with routine healing: Secondary | ICD-10-CM | POA: Diagnosis not present

## 2017-01-29 DIAGNOSIS — H6121 Impacted cerumen, right ear: Secondary | ICD-10-CM | POA: Diagnosis not present

## 2017-02-06 DIAGNOSIS — I482 Chronic atrial fibrillation: Secondary | ICD-10-CM | POA: Diagnosis not present

## 2017-02-06 DIAGNOSIS — I5032 Chronic diastolic (congestive) heart failure: Secondary | ICD-10-CM | POA: Diagnosis not present

## 2017-02-06 DIAGNOSIS — I129 Hypertensive chronic kidney disease with stage 1 through stage 4 chronic kidney disease, or unspecified chronic kidney disease: Secondary | ICD-10-CM | POA: Diagnosis not present

## 2017-02-06 DIAGNOSIS — I509 Heart failure, unspecified: Secondary | ICD-10-CM | POA: Diagnosis not present

## 2017-02-06 DIAGNOSIS — R0602 Shortness of breath: Secondary | ICD-10-CM | POA: Diagnosis not present

## 2017-02-06 DIAGNOSIS — N184 Chronic kidney disease, stage 4 (severe): Secondary | ICD-10-CM | POA: Diagnosis not present

## 2017-02-06 DIAGNOSIS — R05 Cough: Secondary | ICD-10-CM | POA: Diagnosis not present

## 2017-02-07 DIAGNOSIS — I5032 Chronic diastolic (congestive) heart failure: Secondary | ICD-10-CM | POA: Diagnosis not present

## 2017-02-07 DIAGNOSIS — D62 Acute posthemorrhagic anemia: Secondary | ICD-10-CM | POA: Diagnosis not present

## 2017-02-07 DIAGNOSIS — J189 Pneumonia, unspecified organism: Secondary | ICD-10-CM | POA: Diagnosis not present

## 2017-02-07 DIAGNOSIS — S42202D Unspecified fracture of upper end of left humerus, subsequent encounter for fracture with routine healing: Secondary | ICD-10-CM | POA: Diagnosis not present

## 2017-02-08 DIAGNOSIS — N184 Chronic kidney disease, stage 4 (severe): Secondary | ICD-10-CM | POA: Diagnosis not present

## 2017-02-10 DIAGNOSIS — J189 Pneumonia, unspecified organism: Secondary | ICD-10-CM | POA: Diagnosis not present

## 2017-02-10 DIAGNOSIS — I482 Chronic atrial fibrillation: Secondary | ICD-10-CM | POA: Diagnosis not present

## 2017-02-13 DIAGNOSIS — E119 Type 2 diabetes mellitus without complications: Secondary | ICD-10-CM | POA: Diagnosis not present

## 2017-02-13 DIAGNOSIS — Z794 Long term (current) use of insulin: Secondary | ICD-10-CM | POA: Diagnosis not present

## 2017-02-13 DIAGNOSIS — Z7901 Long term (current) use of anticoagulants: Secondary | ICD-10-CM | POA: Diagnosis not present

## 2017-02-13 DIAGNOSIS — Z954 Presence of other heart-valve replacement: Secondary | ICD-10-CM | POA: Diagnosis not present

## 2017-02-13 DIAGNOSIS — S42292D Other displaced fracture of upper end of left humerus, subsequent encounter for fracture with routine healing: Secondary | ICD-10-CM | POA: Diagnosis not present

## 2017-02-13 DIAGNOSIS — R2689 Other abnormalities of gait and mobility: Secondary | ICD-10-CM | POA: Diagnosis not present

## 2017-02-13 DIAGNOSIS — F17200 Nicotine dependence, unspecified, uncomplicated: Secondary | ICD-10-CM | POA: Diagnosis not present

## 2017-02-13 DIAGNOSIS — S42122A Displaced fracture of acromial process, left shoulder, initial encounter for closed fracture: Secondary | ICD-10-CM | POA: Diagnosis not present

## 2017-02-14 ENCOUNTER — Encounter (HOSPITAL_COMMUNITY): Payer: Self-pay | Admitting: Orthopedic Surgery

## 2017-02-14 DIAGNOSIS — Z7901 Long term (current) use of anticoagulants: Secondary | ICD-10-CM | POA: Diagnosis not present

## 2017-02-19 DIAGNOSIS — E119 Type 2 diabetes mellitus without complications: Secondary | ICD-10-CM | POA: Diagnosis not present

## 2017-02-19 DIAGNOSIS — R2689 Other abnormalities of gait and mobility: Secondary | ICD-10-CM | POA: Diagnosis not present

## 2017-02-19 DIAGNOSIS — Z794 Long term (current) use of insulin: Secondary | ICD-10-CM | POA: Diagnosis not present

## 2017-02-19 DIAGNOSIS — S42122A Displaced fracture of acromial process, left shoulder, initial encounter for closed fracture: Secondary | ICD-10-CM | POA: Diagnosis not present

## 2017-02-19 DIAGNOSIS — F17200 Nicotine dependence, unspecified, uncomplicated: Secondary | ICD-10-CM | POA: Diagnosis not present

## 2017-02-27 DIAGNOSIS — S42122A Displaced fracture of acromial process, left shoulder, initial encounter for closed fracture: Secondary | ICD-10-CM | POA: Diagnosis not present

## 2017-02-27 DIAGNOSIS — R2689 Other abnormalities of gait and mobility: Secondary | ICD-10-CM | POA: Diagnosis not present

## 2017-02-27 DIAGNOSIS — E119 Type 2 diabetes mellitus without complications: Secondary | ICD-10-CM | POA: Diagnosis not present

## 2017-02-27 DIAGNOSIS — Z794 Long term (current) use of insulin: Secondary | ICD-10-CM | POA: Diagnosis not present

## 2017-02-27 DIAGNOSIS — F17200 Nicotine dependence, unspecified, uncomplicated: Secondary | ICD-10-CM | POA: Diagnosis not present

## 2017-03-04 DIAGNOSIS — R791 Abnormal coagulation profile: Secondary | ICD-10-CM | POA: Diagnosis not present

## 2017-03-10 ENCOUNTER — Encounter (HOSPITAL_COMMUNITY): Payer: Self-pay | Admitting: Emergency Medicine

## 2017-03-10 ENCOUNTER — Other Ambulatory Visit (HOSPITAL_COMMUNITY): Payer: PPO

## 2017-03-10 ENCOUNTER — Inpatient Hospital Stay (HOSPITAL_COMMUNITY)
Admission: EM | Admit: 2017-03-10 | Discharge: 2017-03-13 | DRG: 291 | Disposition: A | Discharge status: Nursing Home (Includes ICF) | Payer: PPO | Attending: Internal Medicine | Admitting: Internal Medicine

## 2017-03-10 ENCOUNTER — Emergency Department (HOSPITAL_COMMUNITY): Payer: PPO

## 2017-03-10 DIAGNOSIS — T148XXA Other injury of unspecified body region, initial encounter: Secondary | ICD-10-CM | POA: Diagnosis not present

## 2017-03-10 DIAGNOSIS — I130 Hypertensive heart and chronic kidney disease with heart failure and stage 1 through stage 4 chronic kidney disease, or unspecified chronic kidney disease: Secondary | ICD-10-CM | POA: Diagnosis not present

## 2017-03-10 DIAGNOSIS — Z720 Tobacco use: Secondary | ICD-10-CM | POA: Diagnosis present

## 2017-03-10 DIAGNOSIS — Z96612 Presence of left artificial shoulder joint: Secondary | ICD-10-CM | POA: Diagnosis present

## 2017-03-10 DIAGNOSIS — E1122 Type 2 diabetes mellitus with diabetic chronic kidney disease: Secondary | ICD-10-CM | POA: Diagnosis not present

## 2017-03-10 DIAGNOSIS — F17210 Nicotine dependence, cigarettes, uncomplicated: Secondary | ICD-10-CM | POA: Diagnosis present

## 2017-03-10 DIAGNOSIS — Z8551 Personal history of malignant neoplasm of bladder: Secondary | ICD-10-CM | POA: Diagnosis not present

## 2017-03-10 DIAGNOSIS — Z8249 Family history of ischemic heart disease and other diseases of the circulatory system: Secondary | ICD-10-CM

## 2017-03-10 DIAGNOSIS — I482 Chronic atrial fibrillation, unspecified: Secondary | ICD-10-CM | POA: Diagnosis present

## 2017-03-10 DIAGNOSIS — Z7901 Long term (current) use of anticoagulants: Secondary | ICD-10-CM

## 2017-03-10 DIAGNOSIS — I350 Nonrheumatic aortic (valve) stenosis: Secondary | ICD-10-CM | POA: Diagnosis not present

## 2017-03-10 DIAGNOSIS — I10 Essential (primary) hypertension: Secondary | ICD-10-CM | POA: Diagnosis present

## 2017-03-10 DIAGNOSIS — E1140 Type 2 diabetes mellitus with diabetic neuropathy, unspecified: Secondary | ICD-10-CM | POA: Diagnosis present

## 2017-03-10 DIAGNOSIS — M549 Dorsalgia, unspecified: Secondary | ICD-10-CM | POA: Diagnosis present

## 2017-03-10 DIAGNOSIS — N184 Chronic kidney disease, stage 4 (severe): Secondary | ICD-10-CM | POA: Diagnosis not present

## 2017-03-10 DIAGNOSIS — Z953 Presence of xenogenic heart valve: Secondary | ICD-10-CM | POA: Diagnosis not present

## 2017-03-10 DIAGNOSIS — R0602 Shortness of breath: Secondary | ICD-10-CM | POA: Diagnosis not present

## 2017-03-10 DIAGNOSIS — I2510 Atherosclerotic heart disease of native coronary artery without angina pectoris: Secondary | ICD-10-CM | POA: Diagnosis present

## 2017-03-10 DIAGNOSIS — N5089 Other specified disorders of the male genital organs: Secondary | ICD-10-CM | POA: Diagnosis not present

## 2017-03-10 DIAGNOSIS — I5033 Acute on chronic diastolic (congestive) heart failure: Secondary | ICD-10-CM | POA: Diagnosis not present

## 2017-03-10 DIAGNOSIS — J9601 Acute respiratory failure with hypoxia: Secondary | ICD-10-CM | POA: Diagnosis not present

## 2017-03-10 DIAGNOSIS — E785 Hyperlipidemia, unspecified: Secondary | ICD-10-CM | POA: Diagnosis present

## 2017-03-10 DIAGNOSIS — Z7951 Long term (current) use of inhaled steroids: Secondary | ICD-10-CM

## 2017-03-10 DIAGNOSIS — Z794 Long term (current) use of insulin: Secondary | ICD-10-CM

## 2017-03-10 DIAGNOSIS — R0603 Acute respiratory distress: Secondary | ICD-10-CM | POA: Diagnosis present

## 2017-03-10 DIAGNOSIS — E118 Type 2 diabetes mellitus with unspecified complications: Secondary | ICD-10-CM | POA: Diagnosis present

## 2017-03-10 DIAGNOSIS — D649 Anemia, unspecified: Secondary | ICD-10-CM | POA: Diagnosis present

## 2017-03-10 DIAGNOSIS — D638 Anemia in other chronic diseases classified elsewhere: Secondary | ICD-10-CM | POA: Diagnosis present

## 2017-03-10 DIAGNOSIS — E1151 Type 2 diabetes mellitus with diabetic peripheral angiopathy without gangrene: Secondary | ICD-10-CM | POA: Diagnosis present

## 2017-03-10 DIAGNOSIS — F329 Major depressive disorder, single episode, unspecified: Secondary | ICD-10-CM | POA: Diagnosis not present

## 2017-03-10 DIAGNOSIS — J449 Chronic obstructive pulmonary disease, unspecified: Secondary | ICD-10-CM | POA: Diagnosis not present

## 2017-03-10 DIAGNOSIS — I509 Heart failure, unspecified: Secondary | ICD-10-CM | POA: Diagnosis not present

## 2017-03-10 DIAGNOSIS — N400 Enlarged prostate without lower urinary tract symptoms: Secondary | ICD-10-CM | POA: Diagnosis not present

## 2017-03-10 DIAGNOSIS — Z79899 Other long term (current) drug therapy: Secondary | ICD-10-CM

## 2017-03-10 DIAGNOSIS — R069 Unspecified abnormalities of breathing: Secondary | ICD-10-CM | POA: Diagnosis not present

## 2017-03-10 DIAGNOSIS — G8929 Other chronic pain: Secondary | ICD-10-CM | POA: Diagnosis not present

## 2017-03-10 DIAGNOSIS — J90 Pleural effusion, not elsewhere classified: Secondary | ICD-10-CM | POA: Diagnosis not present

## 2017-03-10 DIAGNOSIS — R0600 Dyspnea, unspecified: Secondary | ICD-10-CM | POA: Diagnosis not present

## 2017-03-10 DIAGNOSIS — I110 Hypertensive heart disease with heart failure: Secondary | ICD-10-CM | POA: Diagnosis not present

## 2017-03-10 LAB — HEMOGLOBIN A1C
Hgb A1c MFr Bld: 5 % (ref 4.8–5.6)
MEAN PLASMA GLUCOSE: 96.8 mg/dL

## 2017-03-10 LAB — I-STAT TROPONIN, ED: Troponin i, poc: 0.02 ng/mL (ref 0.00–0.08)

## 2017-03-10 LAB — COMPREHENSIVE METABOLIC PANEL
ALBUMIN: 2.8 g/dL — AB (ref 3.5–5.0)
ALT: 16 U/L — AB (ref 17–63)
AST: 18 U/L (ref 15–41)
Alkaline Phosphatase: 79 U/L (ref 38–126)
Anion gap: 8 (ref 5–15)
BUN: 17 mg/dL (ref 6–20)
CO2: 24 mmol/L (ref 22–32)
CREATININE: 1.35 mg/dL — AB (ref 0.61–1.24)
Calcium: 8.7 mg/dL — ABNORMAL LOW (ref 8.9–10.3)
Chloride: 108 mmol/L (ref 101–111)
GFR calc Af Amer: 51 mL/min — ABNORMAL LOW (ref >60)
GFR calc non Af Amer: 44 mL/min — ABNORMAL LOW (ref >60)
Glucose, Bld: 132 mg/dL — ABNORMAL HIGH (ref 65–99)
POTASSIUM: 4.1 mmol/L (ref 3.5–5.1)
SODIUM: 140 mmol/L (ref 135–145)
Total Bilirubin: 1 mg/dL (ref 0.3–1.2)
Total Protein: 6.6 g/dL (ref 6.5–8.1)

## 2017-03-10 LAB — CBC WITH DIFFERENTIAL/PLATELET
BASOS ABS: 0 10*3/uL (ref 0.0–0.1)
BASOS PCT: 0 %
EOS ABS: 0.2 10*3/uL (ref 0.0–0.7)
Eosinophils Relative: 2 %
HCT: 26.5 % — ABNORMAL LOW (ref 39.0–52.0)
Hemoglobin: 8.3 g/dL — ABNORMAL LOW (ref 13.0–17.0)
LYMPHS PCT: 10 %
Lymphs Abs: 0.9 10*3/uL (ref 0.7–4.0)
MCH: 30.2 pg (ref 26.0–34.0)
MCHC: 31.3 g/dL (ref 30.0–36.0)
MCV: 96.4 fL (ref 78.0–100.0)
Monocytes Absolute: 0.7 10*3/uL (ref 0.1–1.0)
Monocytes Relative: 8 %
Neutro Abs: 7.1 10*3/uL (ref 1.7–7.7)
Neutrophils Relative %: 80 %
Platelets: 142 10*3/uL — ABNORMAL LOW (ref 150–400)
RBC: 2.75 MIL/uL — AB (ref 4.22–5.81)
RDW: 16.5 % — AB (ref 11.5–15.5)
WBC: 8.9 10*3/uL (ref 4.0–10.5)

## 2017-03-10 LAB — PROTIME-INR
INR: 3.6
Prothrombin Time: 35.6 seconds — ABNORMAL HIGH (ref 11.4–15.2)

## 2017-03-10 LAB — CBG MONITORING, ED
Glucose-Capillary: 121 mg/dL — ABNORMAL HIGH (ref 65–99)
Glucose-Capillary: 201 mg/dL — ABNORMAL HIGH (ref 65–99)

## 2017-03-10 LAB — GLUCOSE, CAPILLARY: Glucose-Capillary: 175 mg/dL — ABNORMAL HIGH (ref 65–99)

## 2017-03-10 LAB — D-DIMER, QUANTITATIVE (NOT AT ARMC): D DIMER QUANT: 0.85 ug{FEU}/mL — AB (ref 0.00–0.50)

## 2017-03-10 LAB — BRAIN NATRIURETIC PEPTIDE: B Natriuretic Peptide: 333.3 pg/mL — ABNORMAL HIGH (ref 0.0–100.0)

## 2017-03-10 MED ORDER — SODIUM CHLORIDE 0.9% FLUSH
3.0000 mL | Freq: Two times a day (BID) | INTRAVENOUS | Status: DC
  Administered 2017-03-10 – 2017-03-13 (×7): 3 mL via INTRAVENOUS

## 2017-03-10 MED ORDER — BUPROPION HCL ER (XL) 150 MG PO TB24
150.0000 mg | ORAL_TABLET | ORAL | Status: DC
  Administered 2017-03-11 – 2017-03-13 (×3): 150 mg via ORAL
  Filled 2017-03-10 (×3): qty 1

## 2017-03-10 MED ORDER — INSULIN ASPART 100 UNIT/ML ~~LOC~~ SOLN
0.0000 [IU] | Freq: Three times a day (TID) | SUBCUTANEOUS | Status: DC
  Administered 2017-03-10 – 2017-03-11 (×2): 3 [IU] via SUBCUTANEOUS
  Administered 2017-03-12 (×2): 2 [IU] via SUBCUTANEOUS
  Administered 2017-03-13: 1 [IU] via SUBCUTANEOUS
  Filled 2017-03-10: qty 1

## 2017-03-10 MED ORDER — DOXAZOSIN MESYLATE 8 MG PO TABS
4.0000 mg | ORAL_TABLET | Freq: Every evening | ORAL | Status: DC
  Administered 2017-03-11 – 2017-03-12 (×2): 4 mg via ORAL
  Filled 2017-03-10 (×2): qty 1

## 2017-03-10 MED ORDER — FINASTERIDE 5 MG PO TABS
5.0000 mg | ORAL_TABLET | Freq: Every day | ORAL | Status: DC
  Administered 2017-03-11 – 2017-03-12 (×2): 5 mg via ORAL
  Filled 2017-03-10 (×2): qty 1

## 2017-03-10 MED ORDER — FERROUS SULFATE 325 (65 FE) MG PO TABS
325.0000 mg | ORAL_TABLET | Freq: Two times a day (BID) | ORAL | Status: DC
  Administered 2017-03-10 – 2017-03-13 (×6): 325 mg via ORAL
  Filled 2017-03-10 (×7): qty 1

## 2017-03-10 MED ORDER — POLYETHYLENE GLYCOL 3350 17 G PO PACK
17.0000 g | PACK | Freq: Every day | ORAL | Status: DC
  Administered 2017-03-11 – 2017-03-12 (×2): 17 g via ORAL
  Filled 2017-03-10 (×2): qty 1

## 2017-03-10 MED ORDER — BENAZEPRIL HCL 10 MG PO TABS: 10.0000 mg | ORAL_TABLET | Freq: Every day | ORAL | Status: DC

## 2017-03-10 MED ORDER — UMECLIDINIUM-VILANTEROL 62.5-25 MCG/INH IN AEPB
1.0000 | INHALATION_SPRAY | Freq: Every day | RESPIRATORY_TRACT | Status: DC
Start: 2017-03-11 — End: 2017-03-13
  Administered 2017-03-11 – 2017-03-13 (×3): 1 via RESPIRATORY_TRACT
  Filled 2017-03-10 (×2): qty 14

## 2017-03-10 MED ORDER — SIMVASTATIN 20 MG PO TABS
20.0000 mg | ORAL_TABLET | Freq: Every evening | ORAL | Status: DC
  Administered 2017-03-10 – 2017-03-12 (×3): 20 mg via ORAL
  Filled 2017-03-10 (×4): qty 1

## 2017-03-10 MED ORDER — WARFARIN - PHARMACIST DOSING INPATIENT: Freq: Every day | Status: DC

## 2017-03-10 MED ORDER — RESOURCE THICKENUP CLEAR PO POWD
ORAL | Status: DC | PRN
  Filled 2017-03-10: qty 125

## 2017-03-10 MED ORDER — SODIUM CHLORIDE 0.9 % IV SOLN: 250.0000 mL | INTRAVENOUS | Status: DC | PRN

## 2017-03-10 MED ORDER — INSULIN GLARGINE 100 UNIT/ML ~~LOC~~ SOLN
10.0000 [IU] | Freq: Every day | SUBCUTANEOUS | Status: DC
  Administered 2017-03-10 – 2017-03-11 (×2): 10 [IU] via SUBCUTANEOUS
  Filled 2017-03-10 (×4): qty 0.1

## 2017-03-10 MED ORDER — HYDROCODONE-ACETAMINOPHEN 5-325 MG PO TABS
1.0000 | ORAL_TABLET | ORAL | Status: DC | PRN
Start: 2017-03-10 — End: 2017-03-13
  Administered 2017-03-10: 1 via ORAL
  Filled 2017-03-10: qty 1

## 2017-03-10 MED ORDER — TRAMADOL HCL 50 MG PO TABS
50.0000 mg | ORAL_TABLET | Freq: Every day | ORAL | Status: DC | PRN
  Administered 2017-03-11: 50 mg via ORAL
  Filled 2017-03-10: qty 1

## 2017-03-10 MED ORDER — INSULIN ASPART 100 UNIT/ML ~~LOC~~ SOLN
0.0000 [IU] | Freq: Every day | SUBCUTANEOUS | Status: DC
  Administered 2017-03-11: 2 [IU] via SUBCUTANEOUS

## 2017-03-10 MED ORDER — POTASSIUM CHLORIDE CRYS ER 20 MEQ PO TBCR: 20.0000 meq | EXTENDED_RELEASE_TABLET | Freq: Every day | ORAL | Status: DC

## 2017-03-10 MED ORDER — ONDANSETRON HCL 4 MG/2ML IJ SOLN: 4.0000 mg | Freq: Four times a day (QID) | INTRAMUSCULAR | Status: DC | PRN

## 2017-03-10 MED ORDER — FUROSEMIDE 10 MG/ML IJ SOLN
40.0000 mg | Freq: Once | INTRAMUSCULAR | Status: AC
  Administered 2017-03-10: 40 mg via INTRAVENOUS
  Filled 2017-03-10: qty 4

## 2017-03-10 MED ORDER — SENNOSIDES-DOCUSATE SODIUM 8.6-50 MG PO TABS: 1.0000 | ORAL_TABLET | Freq: Every day | ORAL | Status: DC

## 2017-03-10 MED ORDER — FUROSEMIDE 10 MG/ML IJ SOLN
40.0000 mg | Freq: Two times a day (BID) | INTRAMUSCULAR | Status: DC
  Administered 2017-03-10 – 2017-03-12 (×4): 40 mg via INTRAVENOUS
  Filled 2017-03-10 (×4): qty 4

## 2017-03-10 MED ORDER — LORATADINE 10 MG PO TABS
10.0000 mg | ORAL_TABLET | Freq: Every day | ORAL | Status: DC
  Administered 2017-03-11 – 2017-03-13 (×3): 10 mg via ORAL
  Filled 2017-03-10 (×3): qty 1

## 2017-03-10 MED ORDER — FLUTICASONE PROPIONATE 50 MCG/ACT NA SUSP
1.0000 | Freq: Two times a day (BID) | NASAL | Status: DC
  Administered 2017-03-11 – 2017-03-13 (×5): 1 via NASAL
  Filled 2017-03-10 (×2): qty 16

## 2017-03-10 MED ORDER — SODIUM CHLORIDE 0.9% FLUSH: 3.0000 mL | INTRAVENOUS | Status: DC | PRN

## 2017-03-10 MED ORDER — ACETAMINOPHEN 325 MG PO TABS: 650.0000 mg | ORAL_TABLET | ORAL | Status: DC | PRN

## 2017-03-10 MED ORDER — ALBUTEROL SULFATE (2.5 MG/3ML) 0.083% IN NEBU
2.5000 mg | INHALATION_SOLUTION | Freq: Four times a day (QID) | RESPIRATORY_TRACT | Status: AC
  Administered 2017-03-10 – 2017-03-11 (×3): 2.5 mg via RESPIRATORY_TRACT
  Filled 2017-03-10 (×3): qty 3

## 2017-03-10 MED ORDER — ALENDRONATE SODIUM 70 MG PO TABS: 70.0000 mg | ORAL_TABLET | ORAL | Status: DC

## 2017-03-10 MED ORDER — METOPROLOL SUCCINATE ER 50 MG PO TB24
50.0000 mg | ORAL_TABLET | Freq: Two times a day (BID) | ORAL | Status: DC
  Administered 2017-03-10 – 2017-03-13 (×5): 50 mg via ORAL
  Filled 2017-03-10 (×5): qty 1

## 2017-03-10 NOTE — ED Notes (Addendum)
Saturated brief removed. Pt's penis and scrotum are reddened and have stage 1 breakdown. Old feces cleaned and removed from groin folds Condom cath applied. Pt tolerated well.

## 2017-03-10 NOTE — ED Notes (Signed)
Speech has evaluated patient; Network engineer to order soft diet with thickened liquids

## 2017-03-10 NOTE — ED Notes (Signed)
Heart healthy lunch tray ordered @ 1050

## 2017-03-10 NOTE — Evaluation (Signed)
Clinical/Bedside Swallow Evaluation Patient Details  Name: Ronald [name] MRN: [id number] Date of Birth: 1922-05-03  Today's Date: 03/10/2017 Time: SLP Start Time (ACUTE ONLY): 1336 SLP Stop Time (ACUTE ONLY): 1357 SLP Time Calculation (min) (ACUTE ONLY): 21 min  Past Medical History:  Past Medical History:  Diagnosis Date  . AS (aortic stenosis)    bovine aortic valve replacement 01/07/11  . Bladder cancer [facility])    Bladder Cancer local  . Blood transfusion    w/hip operation  . Cellulitis of left lower extremity   . Chronic atrial fibrillation ([place])   . Chronic diastolic congestive heart failure ([place])   . Claudication in peripheral vascular disease ([place]) 06/17/2011  . COPD (chronic obstructive pulmonary disease) ([place])   . Depression wife died 4 years ago.    Marland Kitchen History of stomach ulcers ~ 1951  . HTN (hypertension)   . Neuropathy due to secondary diabetes ([place])   . Peripheral vascular disease ([place]) very poor circulation legs and feet ... stents right and left legs... done in dr j. Gwenlyn Found 's office.   . Pneumonia 07/25/11   left  . Recurrent upper respiratory infection (URI)    sinusitis  . Renal artery stenosis ([place]) 2006   renal artery stent  . S/P angioplasty with stent, diamond back rotational athrectomy Prox. Rt. SFA 06/17/2011 06/17/2011  . Shortness of breath   . Thrombocytopenia due to drugs    seen by Dr Inda Merlin plts [id number] no rx  . Type II diabetes mellitus ([place])    Past Surgical History:  Past Surgical History:  Procedure Laterality Date  . ABDOMINAL ANGIOGRAM  06/17/2011   Procedure: ABDOMINAL ANGIOGRAM;  Surgeon: Lorretta Harp, MD;  Location[facility] CATH LAB;  Service: Cardiovascular;;  . AORTIC VALVE REPLACEMENT  01/07/2011   Procedure: AORTIC VALVE REPLACEMENT (AVR);  Surgeon: Grace Isaac, MD;  Location: [place];  Service: Open Heart Surgery;  Laterality: N/A;; magna-ease bovine 11mm bioprosthesis  . ATHERECTOMY N/A 06/17/2011   Procedure: ATHERECTOMY;   Surgeon: Lorretta Harp, MD;  [facility] CATH LAB;  Service: Cardiovascular;  Laterality: N/A;  . CARDIAC CATHETERIZATION  11/19/10   normal coronaries, mod AS, 75% l RAS  . CARDIOVERSION  04/03/2011   Procedure: CARDIOVERSION;  Surgeon: Leonie Man, MD;  Location: [place];  Service: Cardiovascular;  Laterality: N/A;  . CATARACT EXTRACTION W/ INTRAOCULAR LENS  IMPLANT, BILATERAL  ~ 2007  . CHOLECYSTECTOMY    . FEMUR IM NAIL  06/27/2011   Procedure: INTRAMEDULLARY (IM) NAIL FEMORAL;  Surgeon: Marin Shutter, MD;  Location: [facility] ORS;  Service: Orthopedics;  Laterality: Right;  . FEMUR IM NAIL Left 12/14/2013   Procedure: INTRAMEDULLARY (IM) NAIL FEMORAL;  Surgeon: Mauri Pole, MD;  Location: [facility] ORS;  Service: Orthopedics;  Laterality: Left;  . LOWER EXTREMITY ANGIOGRAM  06/17/2011   diamondback orbital rotational and cutting balloon atherectomy of the prox R SFA  . LOWER EXTREMITY ANGIOGRAM Bilateral 06/17/2011   Procedure: LOWER EXTREMITY ANGIOGRAM;  Surgeon: Lorretta Harp, MD;  Location: [facility] CATH LAB;  Service: Cardiovascular;  Laterality: Bilateral;  . PERIPHERAL ARTERIAL STENT GRAFT     2006 left anf right illiac stents Dr Deon Pilling  . RENAL ARTERY STENT  2006   "I believe"  . REVERSE SHOULDER ARTHROPLASTY Left 12/31/2016   Procedure: LEFT REVERSE SHOULDER ARTHROPLASTY;  Surgeon: Nicholes Stairs, MD;  Location: [place];  Service: Orthopedics;  Laterality: Left;  . TONSILLECTOMY AND ADENOIDECTOMY     "when  I was a kid"   HPI:  Ronald [name] is a 84 y.o. male with medical history significant AF status post bovine aVR, chronic diastolic heart failure, CAD, COPD, depression, hypertension, diabetes, thrombocytopenia recent hip fracture presents to the emergency department from facility with the chief complaint of shortness of breath. Initial evaluation concerning for acute on chronic diastolic heart failure in the setting of COPD. RN noticed coughing with water and pills   Assessment /  Plan / Recommendation Clinical Impression  Pt seen in ED, partially reclined, but stable respiratory pattern, fully alert despite recently having vicodin. Pt demonstrated immediate coughing over multiple trials of thin liquids. Appears to have impaired timing of airway closure, also swallowing air with immediate belching. Trials of nectar thick liquids, purees and solids all tolerated well. Recommend initiating a dys 3 (mech soft) diet with nectar thick liquids and f/u objective test during admission for objective assessment of swallow function. Discussed with RN.       Aspiration Risk  Mild aspiration risk    Diet Recommendation Dysphagia 3 (Mech soft);Nectar-thick liquid   Liquid Administration via: Cup;Straw Medication Administration: Whole meds with puree Supervision: Patient able to self feed Compensations: Slow rate;Small sips/bites Postural Changes: Seated upright at 90 degrees    Other  Recommendations Oral Care Recommendations: Oral care BID   Follow up Recommendations 24 hour supervision/assistance      Frequency and Duration min 2x/week  2 weeks       Prognosis Prognosis for Safe Diet Advancement: Good      Swallow Study   General HPI: Ronald [name] is a 84 y.o. male with medical history significant AF status post bovine aVR, chronic diastolic heart failure, CAD, COPD, depression, hypertension, diabetes, thrombocytopenia recent hip fracture presents to the emergency department from facility with the chief complaint of shortness of breath. Initial evaluation concerning for acute on chronic diastolic heart failure in the setting of COPD. RN noticed coughing with water and pills Type of Study: Bedside Swallow Evaluation Previous Swallow Assessment: none in chart Diet Prior to this Study: NPO Temperature Spikes Noted: No Respiratory Status: Nasal cannula History of Recent Intubation: No Behavior/Cognition: Alert;Cooperative Oral Cavity Assessment: Dry Oral Care  Completed by SLP: No Oral Cavity - Dentition: Adequate natural dentition Vision: Functional for self-feeding Self-Feeding Abilities: Needs assist Patient Positioning: Partially reclined Baseline Vocal Quality: Normal Volitional Cough: Strong Volitional Swallow: Able to elicit    Oral/Motor/Sensory Function Overall Oral Motor/Sensory Function: Within functional limits   Ice Chips Ice chips: Not tested   Thin Liquid Thin Liquid: Impaired Presentation: Cup;Straw;Self Fed Pharyngeal  Phase Impairments: Cough - Immediate;Suspected delayed Swallow    Nectar Thick Nectar Thick Liquid: Within functional limits Presentation: Straw;Self Fed   Honey Thick Honey Thick Liquid: Not tested   Puree Puree: Within functional limits   Solid   GO   Solid: Within functional limits       [facility], [place] CCC-SLP [phone]  Lynann Beaver 03/10/2017,2:11 PM

## 2017-03-10 NOTE — ED Notes (Signed)
Soft thick lunch diet tray ordered @ 1402

## 2017-03-10 NOTE — ED Notes (Signed)
Meal tray removed from room.

## 2017-03-10 NOTE — ED Provider Notes (Signed)
[facility] EMERGENCY DEPARTMENT Provider Note   CSN: [id number] Arrival date & time: 03/10/17  4270     History   Chief Complaint Chief Complaint  Patient presents with  . Shortness of Breath    HPI Ronald [name] is a 84 y.o. male.  Patient is a 84 year old gentleman with significant medical history of aortic valve replacement, chronic atrial fibrillation, CHF, COPD, diabetes, peripheral artery disease, recent hip fracture in November presenting today with shortness of breath.  Patient states proximally 3 hours prior to arrival he began feeling very short of breath which made him more more anxious.  Patient does not typically wear oxygen at home and states at the facility where he lives they were not doing anything for him.  Patient denies any new cough, congestion or sputum.  He has not had fever.  He has noticed some swelling in his legs but is not sure if that is the same from his recent surgery or if it is new.  Paramedics administered oxygen and 1 albuterol neb which patient states did make him feel better but he still feels slightly short of breath.  He denies any abdominal pain, nausea, vomiting or chest pain.   The history is provided by the patient and the EMS personnel.    Past Medical History:  Diagnosis Date  . AS (aortic stenosis)    bovine aortic valve replacement 01/07/11  . Bladder cancer [facility])    Bladder Cancer local  . Blood transfusion    w/hip operation  . Cellulitis of left lower extremity   . Chronic atrial fibrillation ([place])   . Chronic diastolic congestive heart failure ([place])   . Claudication in peripheral vascular disease ([place]) 06/17/2011  . COPD (chronic obstructive pulmonary disease) ([place])   . Depression wife died 4 years ago.    Marland Kitchen History of stomach ulcers ~ 1951  . HTN (hypertension)   . Neuropathy due to secondary diabetes ([place])   . Normal coronary arteries Sept 2012  . Peripheral vascular disease ([place]) very poor circulation  legs and feet ... stents right and left legs... done in dr j. Gwenlyn Found 's office.   . Pneumonia 07/25/11   left  . Recurrent upper respiratory infection (URI)    sinusitis  . Renal artery stenosis ([place]) 2006   renal artery stent  . S/P angioplasty with stent, diamond back rotational athrectomy Prox. Rt. SFA 06/17/2011 06/17/2011  . Shortness of breath   . Thrombocytopenia due to drugs    seen by Dr Inda Merlin plts [id number] no rx  . Type II diabetes mellitus [facility])     Patient Active Problem List   Diagnosis Date Noted  . Closed fracture of left proximal humerus 12/26/2016  . Comminuted left humeral fracture 12/23/2016  . GI bleed 07/02/2016  . AKI (acute kidney injury) ([place]) 07/02/2016  . Lactic acidemia 07/02/2016  . Lower GI bleed   . Tobacco use 03/07/2015  . Diabetes mellitus type 2, uncontrolled ([place]) 12/20/2013  . Acute blood loss anemia 12/20/2013  . Allergic rhinitis 12/20/2013  . Intertrochanteric fracture of left femur ([place]) 12/12/2013  . Anemia 12/12/2013  . Right acetabular fracture ([place]) 04/11/2013  . Acetabular fracture ([place]) 04/11/2013  . Chronic renal insufficiency, stage IV (severe) ([place]) 12/24/2012  . Bilateral claudication of lower limb ([place]) 12/24/2012  . Cellulitis 12/02/2012  . History of tobacco abuse- 75 years, quit in Feb 2014 07/06/2012  . Chronic diastolic heart failure ([place]) 07/06/2012  . COPD (chronic obstructive  pulmonary disease) ([place]) 05/27/2012  . Pulmonary nodules 05/27/2012  . Normal coronary arteries, cath 11/12 07/25/2011  . Normal left ventricular systolic function, Echo 3/41 07/25/2011  . Closed right hip fracture, ORIF 10/31/20 complicated by gluteal hematoma while being Coumadinized post op 06/27/2011  . Aspirin allergy, rash, SOB. Pt is on Plavix 06/19/2011  . Chronic atrial fibrillation ([place]) 06/19/2011  . Thrombocytopenia ([place]) 01/11/2011  . Leukocytosis 01/11/2011  . S/P aortic valve replacement: #23 Magna Ease Edwards Pericardial Valve   November 2012 01/08/2011  . AS (aortic stenosis)- s/p tissue AVR 01/07/11   . CAROTID BRUIT- moderate ICA disease 5/14 05/31/2008  . Diabetes mellitus with complication ([place]) 29/79/8921  . Hyperlipidemia 07/11/2007  . DEPRESSION 07/11/2007  . RESTLESS LEGS SYNDROME 07/11/2007  . Essential hypertension 07/11/2007  . PVD, Rt SFA PTA/HSRA 06/17/11 07/11/2007  . DIVERTICULOSIS, COLON 07/11/2007  . RENAL CYST 07/11/2007  . ARTHRITIS 07/11/2007  . SKIN CANCER, HX OF 07/11/2007  . RHEUMATIC HEART DISEASE, HX OF 07/11/2007    Past Surgical History:  Procedure Laterality Date  . ABDOMINAL ANGIOGRAM  06/17/2011   Procedure: ABDOMINAL ANGIOGRAM;  Surgeon: Lorretta Harp, MD;  Location[facility] CATH LAB;  Service: Cardiovascular;;  . AORTIC VALVE REPLACEMENT  01/07/2011   Procedure: AORTIC VALVE REPLACEMENT (AVR);  Surgeon: Grace Isaac, MD;  Location: [place];  Service: Open Heart Surgery;  Laterality: N/A;; magna-ease bovine 44mm bioprosthesis  . ATHERECTOMY N/A 06/17/2011   Procedure: ATHERECTOMY;  Surgeon: Lorretta Harp, MD;  Location: [facility] CATH LAB;  Service: Cardiovascular;  Laterality: N/A;  . CARDIAC CATHETERIZATION  11/19/10   normal coronaries, mod AS, 75% l RAS  . CARDIOVERSION  04/03/2011   Procedure: CARDIOVERSION;  Surgeon: Leonie Man, MD;  Location: [place];  Service: Cardiovascular;  Laterality: N/A;  . CATARACT EXTRACTION W/ INTRAOCULAR LENS  IMPLANT, BILATERAL  ~ 2007  . CHOLECYSTECTOMY    . FEMUR IM NAIL  06/27/2011   Procedure: INTRAMEDULLARY (IM) NAIL FEMORAL;  Surgeon: Marin Shutter, MD;  Location: [facility];  Service: Orthopedics;  Laterality: Right;  . FEMUR IM NAIL Left 12/14/2013   Procedure: INTRAMEDULLARY (IM) NAIL FEMORAL;  Surgeon: Mauri Pole, MD;  Location: [facility];  Service: Orthopedics;  Laterality: Left;  . LOWER EXTREMITY ANGIOGRAM  06/17/2011   diamondback orbital rotational and cutting balloon atherectomy of the prox R SFA  . LOWER EXTREMITY ANGIOGRAM  Bilateral 06/17/2011   Procedure: LOWER EXTREMITY ANGIOGRAM;  Surgeon: Lorretta Harp, MD;  Location: [facility] CATH LAB;  Service: Cardiovascular;  Laterality: Bilateral;  . PERIPHERAL ARTERIAL STENT GRAFT     2006 left anf right illiac stents Dr Deon Pilling  . RENAL ARTERY STENT  2006   "I believe"  . REVERSE SHOULDER ARTHROPLASTY Left 12/31/2016   Procedure: LEFT REVERSE SHOULDER ARTHROPLASTY;  Surgeon: Nicholes Stairs, MD;  Location: [place];  Service: Orthopedics;  Laterality: Left;  . TONSILLECTOMY AND ADENOIDECTOMY     "when I was a kid"       Home Medications    Prior to Admission medications   Medication Sig Start Date End Date Taking? Authorizing Provider  alendronate (FOSAMAX) 70 MG tablet Take 70 mg by mouth See admin instructions. Take every other sunday 12/14/16   [provider]  benazepril (LOTENSIN) 10 MG tablet Take 10 mg by mouth daily.    [provider]  buPROPion (WELLBUTRIN XL) 150 MG 24 hr tablet Take 150 mg by mouth every morning.  [provider]  doxazosin (CARDURA) 4 MG tablet Take 4 mg by mouth every evening.     Collins, Gina L, PA-C  ferrous sulfate 325 (65 FE) MG EC tablet Take 1 tablet (325 mg total) by mouth 2 (two) times daily. Patient not taking: Reported on 12/28/2016 07/04/16 07/04/17  Lavina Hamman, MD  finasteride (PROSCAR) 5 MG tablet Take 5 mg by mouth at bedtime.     [provider]  fluticasone (FLONASE) 50 MCG/ACT nasal spray Place 1 spray into both nostrils 2 (two) times daily.     [provider]  furosemide (LASIX) 40 MG tablet Take 1 tablet (40 mg total) by mouth daily. Patient taking differently: Take 40 mg by mouth 2 (two) times daily.  07/04/16   Lavina Hamman, MD  glyBURIDE (DIABETA) 5 MG tablet Take 2.5-10 mg by mouth 2 (two) times daily with a meal. 2 tablets in the morning and 1/2 tablet in the evening    [provider]  HYDROcodone-acetaminophen (NORCO/VICODIN) 5-325 MG tablet Take  1-2 tablets by mouth every 4 (four) hours as needed for moderate pain. Patient not taking: Reported on 12/28/2016 12/25/16   Mariel Aloe, MD  HYDROcodone-acetaminophen (NORCO/VICODIN) 5-325 MG tablet Take 1-2 tablets every 4 (four) hours as needed by mouth (breakthrough pain). 01/03/17   Nicholes Stairs, MD  Insulin Glargine (LANTUS) 100 UNIT/ML Solostar Pen Inject 10 Units into the skin daily.     [provider]  loratadine (CLARITIN) 10 MG tablet Take 10 mg by mouth daily.    [provider]  metoprolol succinate (TOPROL-XL) 50 MG 24 hr tablet Take 50 mg by mouth 2 (two) times daily. Take with or immediately following a meal.    [provider]  OVER THE COUNTER MEDICATION Take 1 capsule by mouth 2 (two) times daily. "Higher Thoughts"    [provider]  polyethylene glycol (MIRALAX / GLYCOLAX) packet Take 17 g by mouth daily. 12/25/16   Mariel Aloe, MD  potassium chloride SA (K-DUR,KLOR-CON) 20 MEQ tablet Take 20 mEq by mouth at bedtime.     [provider]  senna-docusate (SENOKOT-S) 8.6-50 MG tablet Take 1 tablet by mouth at bedtime. 12/25/16   Mariel Aloe, MD  simvastatin (ZOCOR) 20 MG tablet Take 20 mg by mouth every evening.    [provider]  traMADol (ULTRAM) 50 MG tablet Take 50 mg by mouth every evening.     [provider]  Umeclidinium-Vilanterol (ANORO ELLIPTA) 62.5-25 MCG/INH AEPB Inhale 1 puff into the lungs daily.    [provider]  warfarin (COUMADIN) 2 MG tablet Take 2-3 mg by mouth See admin instructions. Take 2mg  by mouth on Monday, Wednesday, Friday, Saturday and Sunday; Take 3 mg by mouth on Tuesday and Thursday 12/15/16   [provider]    Family History Family History  Problem Relation Age of Onset  . Heart disease Mother   . Cancer Brother        colon  . Stroke Father     Social History Social History   Tobacco Use  . Smoking status: Current Every Day Smoker      Packs/day: 1.50    Years: 75.00    Pack years: 112.50    Types: Cigarettes  . Smokeless tobacco: Never Used  Substance Use Topics  . Alcohol use: No    Alcohol/week: 1.2 oz    Types: 2 Cans of beer per week  . Drug use: No  Allergies   Aspirin   Review of Systems Review of Systems  All other systems reviewed and are negative.    Physical Exam Updated Vital Signs BP (!) 132/55   Pulse 87   Temp 97.8 F (36.6 C) (Oral)   Resp (!) 23   SpO2 100%   Physical Exam  Constitutional: He is oriented to person, place, and time. He appears well-developed and well-nourished. No distress.  HENT:  Head: Normocephalic and atraumatic.  Mouth/Throat: Oropharynx is clear and moist.  Eyes: Conjunctivae and EOM are normal. Pupils are equal, round, and reactive to light.  Neck: Normal range of motion. Neck supple.  Cardiovascular: Normal rate and intact distal pulses. An irregularly irregular rhythm present.  No murmur heard. Pulmonary/Chest: Effort normal. No respiratory distress. He has no wheezes. He has no rhonchi. He has rales in the right lower field and the left lower field.  Abdominal: Soft. He exhibits no distension. There is no tenderness. There is no rebound and no guarding.  Musculoskeletal: Normal range of motion. He exhibits no tenderness.       Right lower leg: He exhibits edema.       Left lower leg: He exhibits edema.  Trace to 1+ pitting edema in bilateral lower extremities.  Right slightly worse than left  Neurological: He is alert and oriented to person, place, and time.  Skin: Skin is warm and dry. No rash noted. No erythema.  Psychiatric: He has a normal mood and affect. His behavior is normal.  Nursing note and vitals reviewed.    ED Treatments / Results  Labs (all labs ordered are listed, but only abnormal results are displayed) Labs Reviewed  CBC WITH DIFFERENTIAL/PLATELET - Abnormal; Notable for the following components:      Result Value   RBC  2.75 (*)    Hemoglobin 8.3 (*)    HCT 26.5 (*)    RDW 16.5 (*)    Platelets 142 (*)    All other components within normal limits  COMPREHENSIVE METABOLIC PANEL - Abnormal; Notable for the following components:   Glucose, Bld 132 (*)    Creatinine, Ser 1.35 (*)    Calcium 8.7 (*)    Albumin 2.8 (*)    ALT 16 (*)    GFR calc non Af Amer 44 (*)    GFR calc Af Amer 51 (*)    All other components within normal limits  BRAIN NATRIURETIC PEPTIDE - Abnormal; Notable for the following components:   B Natriuretic Peptide 333.3 (*)    All other components within normal limits  D-DIMER, QUANTITATIVE (NOT AT [facility]) - Abnormal; Notable for the following components:   D-Dimer, Quant 0.85 (*)    All other components within normal limits  PROTIME-INR - Abnormal; Notable for the following components:   Prothrombin Time 35.6 (*)    All other components within normal limits  I-STAT TROPONIN, ED    EKG  EKG Interpretation  Date/Time:  Monday March 10 2017 08:49:08 EST Ventricular Rate:  84 PR Interval:    QRS Duration: 127 QT Interval:  420 QTC Calculation: 497 R Axis:   -60 Text Interpretation:  Atrial fibrillation Ventricular premature complex Nonspecific IVCD with LAD Probable anteroseptal infarct, old Nonspecific T abnormalities, lateral leads No significant change since last tracing Confirmed by Blanchie Dessert [phone]) on 03/10/2017 9:45:12 AM       Radiology Dg Chest 2 View  Result Date: 03/10/2017 CLINICAL DATA:  Sob,hx copd,htn EXAM: CHEST - 2 VIEW COMPARISON:  11/01/2014 FINDINGS: Bilateral interstitial edema/infiltrates predominately perihilar and infrahilar, new since previous. Heart size upper limits normal. Small bilateral pleural effusions have developed. No pneumothorax. Previous AVR, left atrial clipping, and median sternotomy. Interval placement of left shoulder arthroplasty hardware. IMPRESSION: 1. New small pleural effusions and bilateral interstitial edema/infiltrates,  suggesting CHF. Electronically Signed   By: Lucrezia Europe M.D.   On: 03/10/2017 10:52    Procedures Procedures (including critical care time)  Medications Ordered in ED Medications  furosemide (LASIX) injection 40 mg (not administered)     Initial Impression / Assessment and Plan / ED Course  I have reviewed the triage vital signs and the nursing notes.  Pertinent labs & imaging results that were available during my care of the patient were reviewed by me and considered in my medical decision making (see chart for details).     Patient presenting with sudden onset of chest pain today concern for possible PE given recent surgery and fractures, CHF, ACS, COPD.  Lower suspicion for infectious etiology as patient has no complaints consistent with that.  Patient does have some mild rales in the lower lobes but no wheezing at this time.  Chest x-ray, troponin, BNP, CBC, d-dimer, CMP are pending.  Patient's EKG is unchanged.  10:19 AM Labs show a stable anemia with a hemoglobin of 8, CMP with unchanged renal function, negative troponin, BNP slightly elevated at 333.  D-dimer is 0.85 which age-adjusted is within normal limits.  Chest x-ray consistent with fluid overload.  Will give IV lasix and admit.  Last Echo in 2016 with EF of 60-65%.  Final Clinical Impressions(s) / ED Diagnoses   Final diagnoses:  Acute congestive heart failure, unspecified heart failure type [facility])    ED Discharge Orders    None       Blanchie Dessert, MD 03/10/17 1056

## 2017-03-10 NOTE — ED Triage Notes (Addendum)
Per EMS- pt brought in from [place] on [address] for c.o. Shortness of breath starting 1 hour. EMS noted wheezing to bilateral lower lobes and administered 5mg  of albuterol with noted improvement. The pt denies chest pain or hx of HF. Pt does have hx of COPD. Pt is unsure if he has had a cough or fever when asked. Afebrile at triage. CBG 160

## 2017-03-10 NOTE — ED Notes (Signed)
ED Provider at bedside. 

## 2017-03-10 NOTE — ED Notes (Signed)
Patient transported to X-ray 

## 2017-03-10 NOTE — ED Notes (Addendum)
Admitting MD shown skin breakdown on penis and scrotum. Tech hooking urine tube to suction to keep penile shaft dry.

## 2017-03-10 NOTE — ED Notes (Signed)
Daughter, [name], [phone]) reports [facility] speech pathologist recommended swallow evaluation. [name] was going to call today to schedule one and requesting one while here.

## 2017-03-10 NOTE — H&P (Signed)
History and Physical    OMIR COOPRIDER MCN:[id number] DOB: 1923-02-23 DOA: 03/10/2017  PCP: [name], MD Patient coming from: facility  Chief Complaint: sob  HPI: Ronald [name] is a 84 y.o. male with medical history significant AF status post bovine aVR, chronic diastolic heart failure, CAD, COPD, depression, hypertension, diabetes, thrombocytopenia recent hip fracture presents to the emergency department from facility with the chief complaint of shortness of breath. Initial evaluation concerning for acute on chronic diastolic heart failure in the setting of COPD.  Information is obtained from the chart and the patient. He states he was having "a very difficult time breathing". He states it started very early this morning. He does not wear oxygen at baseline he continues to smoke at the facility where he lives when he can get outdoors. Associated symptoms include increased coughing with sputum production as well as lower extremity edema. He denies fever chills headache dizziness syncope or near-syncope. He denies chest pain palpitation abdominal pain nausea vomiting diarrhea constipation melena bright red blood per rectum. He denies dysuria hematuria frequency or urgency. He states EMS was called he was provided with oxygen and nebulizer and feels as though his breathing is "back to normal" at the time of admission.   ED Course: In the emergency department he's afebrile hemodynamically stable and not hypoxic. He is provided with Lasix 40 mg IV and oxygen supplementation.  Review of Systems: As per HPI otherwise all other systems reviewed and are negative.   Ambulatory Status: Ambulates with a walker at [facility] facility. Minimal assist  Past Medical History:  Diagnosis Date  . AS (aortic stenosis)    bovine aortic valve replacement 01/07/11  . Bladder cancer [facility])    Bladder Cancer local  . Blood transfusion    w/hip operation  . Cellulitis of left lower extremity   . Chronic  atrial fibrillation ([place])   . Chronic diastolic congestive heart failure ([place])   . Claudication in peripheral vascular disease ([place]) 06/17/2011  . COPD (chronic obstructive pulmonary disease) ([place])   . Depression wife died 4 years ago.    Marland Kitchen History of stomach ulcers ~ 1951  . HTN (hypertension)   . Neuropathy due to secondary diabetes ([place])   . Normal coronary arteries Sept 2012  . Peripheral vascular disease ([place]) very poor circulation legs and feet ... stents right and left legs... done in dr j. Gwenlyn Found 's office.   . Pneumonia 07/25/11   left  . Recurrent upper respiratory infection (URI)    sinusitis  . Renal artery stenosis ([place]) 2006   renal artery stent  . S/P angioplasty with stent, diamond back rotational athrectomy Prox. Rt. SFA 06/17/2011 06/17/2011  . Shortness of breath   . Thrombocytopenia due to drugs    seen by Dr Inda Merlin plts [id number] no rx  . Type II diabetes mellitus ([place])     Past Surgical History:  Procedure Laterality Date  . ABDOMINAL ANGIOGRAM  06/17/2011   Procedure: ABDOMINAL ANGIOGRAM;  Surgeon: Lorretta Harp, MD;  Location: [facility] CATH LAB;  Service: Cardiovascular;;  . AORTIC VALVE REPLACEMENT  01/07/2011   Procedure: AORTIC VALVE REPLACEMENT (AVR);  Surgeon: Grace Isaac, MD;  Location: [place];  Service: Open Heart Surgery;  Laterality: N/A;; magna-ease bovine 53mm bioprosthesis  . ATHERECTOMY N/A 06/17/2011   Procedure: ATHERECTOMY;  Surgeon: Lorretta Harp, MD;  Location: [facility] CATH LAB;  Service: Cardiovascular;  Laterality: N/A;  . CARDIAC CATHETERIZATION  11/19/10   normal coronaries, mod AS,  75% l RAS  . CARDIOVERSION  04/03/2011   Procedure: CARDIOVERSION;  Surgeon: Leonie Man, MD;  Location: [place];  Service: Cardiovascular;  Laterality: N/A;  . CATARACT EXTRACTION W/ INTRAOCULAR LENS  IMPLANT, BILATERAL  ~ 2007  . CHOLECYSTECTOMY    . FEMUR IM NAIL  06/27/2011   Procedure: INTRAMEDULLARY (IM) NAIL FEMORAL;  Surgeon: Marin Shutter, MD;  Location:  [facility];  Service: Orthopedics;  Laterality: Right;  . FEMUR IM NAIL Left 12/14/2013   Procedure: INTRAMEDULLARY (IM) NAIL FEMORAL;  Surgeon: Mauri Pole, MD;  Location: [facility];  Service: Orthopedics;  Laterality: Left;  . LOWER EXTREMITY ANGIOGRAM  06/17/2011   diamondback orbital rotational and cutting balloon atherectomy of the prox R SFA  . LOWER EXTREMITY ANGIOGRAM Bilateral 06/17/2011   Procedure: LOWER EXTREMITY ANGIOGRAM;  Surgeon: Lorretta Harp, MD;  Location: [facility] CATH LAB;  Service: Cardiovascular;  Laterality: Bilateral;  . PERIPHERAL ARTERIAL STENT GRAFT     2006 left anf right illiac stents Dr Deon Pilling  . RENAL ARTERY STENT  2006   "I believe"  . REVERSE SHOULDER ARTHROPLASTY Left 12/31/2016   Procedure: LEFT REVERSE SHOULDER ARTHROPLASTY;  Surgeon: Nicholes Stairs, MD;  Location: [place];  Service: Orthopedics;  Laterality: Left;  . TONSILLECTOMY AND ADENOIDECTOMY     "when I was a kid"    Social History   Socioeconomic History  . Marital status: Widowed    Spouse name: Not on file  . Number of children: 3  . Years of education: Not on file  . Highest education level: Not on file  Social Needs  . Financial resource strain: Not on file  . Food insecurity - worry: Not on file  . Food insecurity - inability: Not on file  . Transportation needs - medical: Not on file  . Transportation needs - non-medical: Not on file  Occupational History  . Occupation: Press photographer  Tobacco Use  . Smoking status: Current Every Day Smoker    Packs/day: 1.50    Years: 75.00    Pack years: 112.50    Types: Cigarettes  . Smokeless tobacco: Never Used  Substance and Sexual Activity  . Alcohol use: No    Alcohol/week: 1.2 oz    Types: 2 Cans of beer per week  . Drug use: No  . Sexual activity: No  Other Topics Concern  . Not on file  Social History Narrative  . Not on file    Allergies  Allergen Reactions  . Aspirin Anaphylaxis, Shortness Of Breath and Rash    "broke out in  white welts; red blotches neck and face; windpipe closing; ~ 1962"    Family History  Problem Relation Age of Onset  . Heart disease Mother   . Cancer Brother        colon  . Stroke Father     Prior to Admission medications   Medication Sig Start Date End Date Taking? Authorizing Provider  alendronate (FOSAMAX) 70 MG tablet Take 70 mg by mouth See admin instructions. Take every other sunday 12/14/16   [provider]  benazepril (LOTENSIN) 10 MG tablet Take 10 mg by mouth daily.    [provider]  buPROPion (WELLBUTRIN XL) 150 MG 24 hr tablet Take 150 mg by mouth every morning.     [provider]  doxazosin (CARDURA) 4 MG tablet Take 4 mg by mouth every evening.     Collins, Gina L, PA-C  ferrous sulfate 325 (65 FE) MG  EC tablet Take 1 tablet (325 mg total) by mouth 2 (two) times daily. Patient not taking: Reported on 12/28/2016 07/04/16 07/04/17  Lavina Hamman, MD  finasteride (PROSCAR) 5 MG tablet Take 5 mg by mouth at bedtime.     [provider]  fluticasone (FLONASE) 50 MCG/ACT nasal spray Place 1 spray into both nostrils 2 (two) times daily.     [provider]  furosemide (LASIX) 40 MG tablet Take 1 tablet (40 mg total) by mouth daily. Patient taking differently: Take 40 mg by mouth 2 (two) times daily.  07/04/16   Lavina Hamman, MD  glyBURIDE (DIABETA) 5 MG tablet Take 2.5-10 mg by mouth 2 (two) times daily with a meal. 2 tablets in the morning and 1/2 tablet in the evening    [provider]  HYDROcodone-acetaminophen (NORCO/VICODIN) 5-325 MG tablet Take 1-2 tablets every 4 (four) hours as needed by mouth (breakthrough pain). 01/03/17   Nicholes Stairs, MD  Insulin Glargine (LANTUS) 100 UNIT/ML Solostar Pen Inject 10 Units into the skin daily.     [provider]  loratadine (CLARITIN) 10 MG tablet Take 10 mg by mouth daily.    [provider]  metoprolol succinate (TOPROL-XL) 50 MG 24 hr tablet Take 50  mg by mouth 2 (two) times daily. Take with or immediately following a meal.    [provider]  OVER THE COUNTER MEDICATION Take 1 capsule by mouth 2 (two) times daily. "Higher Thoughts"    [provider]  polyethylene glycol (MIRALAX / GLYCOLAX) packet Take 17 g by mouth daily. 12/25/16   Mariel Aloe, MD  potassium chloride SA (K-DUR,KLOR-CON) 20 MEQ tablet Take 20 mEq by mouth at bedtime.     [provider]  senna-docusate (SENOKOT-S) 8.6-50 MG tablet Take 1 tablet by mouth at bedtime. 12/25/16   Mariel Aloe, MD  simvastatin (ZOCOR) 20 MG tablet Take 20 mg by mouth every evening.    [provider]  traMADol (ULTRAM) 50 MG tablet Take 50 mg by mouth every evening.     [provider]  Umeclidinium-Vilanterol (ANORO ELLIPTA) 62.5-25 MCG/INH AEPB Inhale 1 puff into the lungs daily.    [provider]  warfarin (COUMADIN) 2 MG tablet Take 2-3 mg by mouth See admin instructions. Take 2mg  by mouth on Monday, Wednesday, Friday, Saturday and Sunday; Take 3 mg by mouth on Tuesday and Thursday 12/15/16   [provider]    Physical Exam: Vitals:   03/10/17 0837 03/10/17 0846 03/10/17 1043  BP: (!) 160/82 (!) 132/55 (!) 154/65  Pulse: 84 87 69  Resp: 18 (!) 23 20  Temp: 97.8 F (36.6 C)    TempSrc: Oral    SpO2: 96% 100% 100%     General:  Appears calm and comfortable in no acute distress Eyes:  PERRL, EOMI, normal lids, iris ENT:  grossly normal hearing, lips & tongue, slightly pale and very dry Neck:  no LAD, masses or thyromegaly Cardiovascular:  Irregularly irregular no m/r/g. 1+ lower extremity pitting edema Respiratory:  Only mild increased work of breathing with conversation. Breath sounds with fine crackles bilateral bases and very faint end expiratory wheezing Abdomen:  soft, ntnd, positive bowel sounds throughout no guarding or rebounding Skin:  Scrotum and penis with erythema some edema and denies otherwise  skin without rashes or lesions Musculoskeletal:  grossly normal tone BUE/BLE, good ROM, no bony abnormality Psychiatric:  grossly normal mood and affect, speech fluent and appropriate,  AOx3 Neurologic:  Alert and oriented to self and place. Speech clear facial symmetry.   Labs on Admission: I have personally reviewed following labs and imaging studies  CBC: Recent Labs  Lab 03/10/17 0853  WBC 8.9  NEUTROABS 7.1  HGB 8.3*  HCT 26.5*  MCV 96.4  PLT 272*   Basic Metabolic Panel: Recent Labs  Lab 03/10/17 0853  NA 140  K 4.1  CL 108  CO2 24  GLUCOSE 132*  BUN 17  CREATININE 1.35*  CALCIUM 8.7*   GFR: CrCl cannot be calculated (Unknown ideal weight.). Liver Function Tests: Recent Labs  Lab 03/10/17 0853  AST 18  ALT 16*  ALKPHOS 79  BILITOT 1.0  PROT 6.6  ALBUMIN 2.8*   No results for input(s): LIPASE, AMYLASE in the last 168 hours. No results for input(s): AMMONIA in the last 168 hours. Coagulation Profile: Recent Labs  Lab 03/10/17 0853  INR 3.60   Cardiac Enzymes: No results for input(s): CKTOTAL, CKMB, CKMBINDEX, TROPONINI in the last 168 hours. BNP (last 3 results) No results for input(s): PROBNP in the last 8760 hours. HbA1C: No results for input(s): HGBA1C in the last 72 hours. CBG: No results for input(s): GLUCAP in the last 168 hours. Lipid Profile: No results for input(s): CHOL, HDL, LDLCALC, TRIG, CHOLHDL, LDLDIRECT in the last 72 hours. Thyroid Function Tests: No results for input(s): TSH, T4TOTAL, FREET4, T3FREE, THYROIDAB in the last 72 hours. Anemia Panel: No results for input(s): VITAMINB12, FOLATE, FERRITIN, TIBC, IRON, RETICCTPCT in the last 72 hours. Urine analysis:    Component Value Date/Time   COLORURINE YELLOW 12/13/2013 1556   APPEARANCEUR CLOUDY (A) 12/13/2013 1556   LABSPEC 1.012 12/13/2013 1556   PHURINE 6.0 12/13/2013 1556   GLUCOSEU NEGATIVE 12/13/2013 1556   HGBUR NEGATIVE 12/13/2013 1556   BILIRUBINUR NEGATIVE  12/13/2013 1556   KETONESUR NEGATIVE 12/13/2013 1556   PROTEINUR NEGATIVE 12/13/2013 1556   UROBILINOGEN 1.0 12/13/2013 1556   NITRITE NEGATIVE 12/13/2013 1556   LEUKOCYTESUR NEGATIVE 12/13/2013 1556    Creatinine Clearance: CrCl cannot be calculated (Unknown ideal weight.).  Sepsis Labs: @LABRCNTIP (procalcitonin:4,lacticidven:4) )No results found for this or any previous visit (from the past 240 hour(s)).   Radiological Exams on Admission: Dg Chest 2 View  Result Date: 03/10/2017 CLINICAL DATA:  Sob,hx copd,htn EXAM: CHEST - 2 VIEW COMPARISON:  11/01/2014 FINDINGS: Bilateral interstitial edema/infiltrates predominately perihilar and infrahilar, new since previous. Heart size upper limits normal. Small bilateral pleural effusions have developed. No pneumothorax. Previous AVR, left atrial clipping, and median sternotomy. Interval placement of left shoulder arthroplasty hardware. IMPRESSION: 1. New small pleural effusions and bilateral interstitial edema/infiltrates, suggesting CHF. Electronically Signed   By: Lucrezia Europe M.D.   On: 03/10/2017 10:52    EKG:  Atrial fibrillation Ventricular premature complex Nonspecific IVCD with LAD Probable anteroseptal infarct, old Nonspecific T abnormalities, lateral leads No significant change since last tracing  Assessment/Plan Principal Problem:   Acute respiratory distress Active Problems:   Diabetes mellitus with complication (HCC)   Essential hypertension   AS (aortic stenosis)- s/p tissue AVR 01/07/11   Chronic atrial fibrillation (HCC)   COPD (chronic obstructive pulmonary disease) (HCC)   Acute on chronic diastolic (congestive) heart failure (HCC)   Chronic renal insufficiency, stage IV (severe) (HCC)   Anemia   Tobacco use   Skin excoriation   1. Acute respiratory distress likely related to acute on chronic diastolic heart failure in the setting of COPD. Patient does not wear oxygen at home. Chest  x-ray reveals new small pleural  effusions and bilateral interstitial edema/infiltrate suggesting CHF. BNP 333. Some concerns for aspiration. He is provided with oxygen supplementation and his oxygen saturation levels greater than 90% at the time of admission -Admit to telemetry inpatient -Continue oxygen supplementation -Monitor oxygen saturation level -IV Lasix twice a day -Speech therapy consult -Nothing by mouth until evaluated by speech -Scheduled nebulizers -Continue home inhalers -Wean oxygen as able  #2. Acute on chronic diastolic heart failure. Etiology unclear. No chest pain. Troponins negative. EKG shows no acute changes He's had a facility so compliance with his medications. Chest x-ray with some new small pleural effusions. Family with some concern for aspiration. Home medications include cardura, Lasix, metoprolol -Obtain a 2-D echo -IV Lasix as noted above -Monitor intake and output -Obtain daily weights -Continue other home medications  #3. A. Fib chronic. Rate controlled. Home medications include Coumadin. INR 3.6. Medications include metoprolol. EKG as noted above. -coumadin per pharmacy -Continue home meds  #4. COPD. Appears close to baseline. See #1 -Scheduled nebs -Continue home meds -monitor  #5. Aortic stenosis. Status post aVR in 2012. Home medications include Coumadin -See above -2-D echo -Coumadin per pharmacy  #6. Chronic kidney disease stage IV. Creatinine 1.35 on admission. His appears to be close to baseline.  -Hold nephrotoxins as able -Monitor urine output -Recheck in the morning  #7. Anemia. Hemoglobin A 0.8. This appears to be close to baseline. Likely related to chronic disease. No signs symptoms active bleeding -Monitor  #8. Diabetes. Serum glucose 132 on admission. Home meds include Lantus low-dose with oral agents. -Hold oral agents for now -Continue Lantus -sliding scale insulin for optimal control  #9. Hypertension. Fair control in the emergency  department. -Continue home meds  #10. Tobacco use -Cessation counseling offered  #11. Chronic back pain. Appears stable at baseline. -Continue home meds -Physical therapy  #12. Skin excoriation/stage 1 breakdown. Scrotum and penis with erythema and swelling/tenderness. Nursing reports upon arrival patient soiled with stool and urine.  -keep clean and dry -skin care consult -monitor   DVT prophylaxis: coumadin  Code Status: full  Family Communication: none present  Disposition Plan: back to facility  Consults called: none Admission status: inpatient    Radene Gunning MD [facility]  If 7PM-7AM, please contact night-coverage [email or web address] Password [email or web address]  03/10/2017, 11:59 AM

## 2017-03-10 NOTE — ED Notes (Signed)
Pt resting; no needs. Comfortable. Reports pain improved.

## 2017-03-10 NOTE — ED Notes (Signed)
Report given to Ruth

## 2017-03-10 NOTE — Progress Notes (Signed)
ANTICOAGULATION CONSULT NOTE - Initial Consult  Pharmacy Consult for Warfarin Indication: atrial fibrillation  Allergies  Allergen Reactions  . Aspirin Anaphylaxis, Shortness Of Breath and Rash    "broke out in white welts; red blotches neck and face; windpipe closing; ~ 1962"    Patient Measurements:    Vital Signs: Temp: 97.8 F (36.6 C) (01/14 0837) Temp Source: Oral (01/14 0837) BP: 154/65 (01/14 1043) Pulse Rate: 69 (01/14 1043)  Labs: Recent Labs    03/10/17 0853  HGB 8.3*  HCT 26.5*  PLT 142*  LABPROT 35.6*  INR 3.60  CREATININE 1.35*    CrCl cannot be calculated (Unknown ideal weight.).   Medical History: Past Medical History:  Diagnosis Date  . AS (aortic stenosis)    bovine aortic valve replacement 01/07/11  . Bladder cancer [facility])    Bladder Cancer local  . Blood transfusion    w/hip operation  . Cellulitis of left lower extremity   . Chronic atrial fibrillation ([place])   . Chronic diastolic congestive heart failure ([place])   . Claudication in peripheral vascular disease ([place]) 06/17/2011  . COPD (chronic obstructive pulmonary disease) ([place])   . Depression wife died 4 years ago.    Marland Kitchen History of stomach ulcers ~ 1951  . HTN (hypertension)   . Neuropathy due to secondary diabetes ([place])   . Normal coronary arteries Sept 2012  . Peripheral vascular disease ([place]) very poor circulation legs and feet ... stents right and left legs... done in dr j. Gwenlyn Found 's office.   . Pneumonia 07/25/11   left  . Recurrent upper respiratory infection (URI)    sinusitis  . Renal artery stenosis ([place]) 2006   renal artery stent  . S/P angioplasty with stent, diamond back rotational athrectomy Prox. Rt. SFA 06/17/2011 06/17/2011  . Shortness of breath   . Thrombocytopenia due to drugs    seen by Dr Inda Merlin plts [id number] no rx  . Type II diabetes mellitus (HCC)     Medications:  Warfarin PTA dose 2mg  daily except 1mg  on Tuesdays and Thursdays  Assessment: 84 year old male  on warfarin prior to admission on atrial fibrillation.   INR up at 3.60. Hgb down at 8.3, Platelets 142.   Goal of Therapy:  INR 2-3 Monitor platelets by anticoagulation protocol: Yes   Plan:  No warfarin tonight Daily PT/INR  Sloan Leiter, PharmD, BCPS, BCCCP Clinical Pharmacist Clinical phone 03/10/2017 until 3:30PM - #[phone] After hours, please call [phone] 03/10/2017,12:07 PM

## 2017-03-10 NOTE — ED Notes (Addendum)
RN called speech and left to notify of evaluation needed.

## 2017-03-11 ENCOUNTER — Inpatient Hospital Stay (HOSPITAL_COMMUNITY): Payer: PPO

## 2017-03-11 ENCOUNTER — Other Ambulatory Visit: Payer: Self-pay

## 2017-03-11 DIAGNOSIS — D649 Anemia, unspecified: Secondary | ICD-10-CM

## 2017-03-11 DIAGNOSIS — I5033 Acute on chronic diastolic (congestive) heart failure: Secondary | ICD-10-CM

## 2017-03-11 DIAGNOSIS — N184 Chronic kidney disease, stage 4 (severe): Secondary | ICD-10-CM

## 2017-03-11 DIAGNOSIS — T148XXA Other injury of unspecified body region, initial encounter: Secondary | ICD-10-CM

## 2017-03-11 LAB — BASIC METABOLIC PANEL
Anion gap: 9 (ref 5–15)
BUN: 16 mg/dL (ref 6–20)
CHLORIDE: 104 mmol/L (ref 101–111)
CO2: 29 mmol/L (ref 22–32)
CREATININE: 1.41 mg/dL — AB (ref 0.61–1.24)
Calcium: 8.5 mg/dL — ABNORMAL LOW (ref 8.9–10.3)
GFR calc Af Amer: 48 mL/min — ABNORMAL LOW (ref >60)
GFR calc non Af Amer: 42 mL/min — ABNORMAL LOW (ref >60)
Glucose, Bld: 62 mg/dL — ABNORMAL LOW (ref 65–99)
POTASSIUM: 3.1 mmol/L — AB (ref 3.5–5.1)
Sodium: 142 mmol/L (ref 135–145)

## 2017-03-11 LAB — GLUCOSE, CAPILLARY
GLUCOSE-CAPILLARY: 54 mg/dL — AB (ref 65–99)
GLUCOSE-CAPILLARY: 79 mg/dL (ref 65–99)
GLUCOSE-CAPILLARY: 219 mg/dL — AB (ref 65–99)
Glucose-Capillary: 235 mg/dL — ABNORMAL HIGH (ref 65–99)
Glucose-Capillary: 110 mg/dL — ABNORMAL HIGH (ref 65–99)

## 2017-03-11 LAB — MRSA PCR SCREENING: MRSA by PCR: NEGATIVE

## 2017-03-11 LAB — PROTIME-INR
INR: 4.21 — AB
Prothrombin Time: 40.3 seconds — ABNORMAL HIGH (ref 11.4–15.2)

## 2017-03-11 MED ORDER — POTASSIUM CHLORIDE CRYS ER 20 MEQ PO TBCR
40.0000 meq | EXTENDED_RELEASE_TABLET | Freq: Two times a day (BID) | ORAL | Status: AC
  Administered 2017-03-11 (×2): 40 meq via ORAL
  Filled 2017-03-11 (×3): qty 2

## 2017-03-11 NOTE — Progress Notes (Signed)
Modified Barium Swallow Progress Note  Patient Details  Name: Ronald [name] MRN: [id number] Date of Birth: 14-Apr-1922  Today's Date: 03/11/2017  Modified Barium Swallow completed.  Full report located under Chart Review in the Imaging Section.  Brief recommendations include the following:  Clinical Impression  Pt presents with a moderate oropharyngeal dysphagia with late laryngeal clousre leading to sensed penetration to the cords during the swallow. Pt clears throat or coughs softly, but not forcefully enough to fully eject penetrate. A cue to fully clearly with friction and swallow again is consistently successfully. Pt is hard of hearing but cognitively in tact to learn this strategy. A chin tuck was not successful and mild pharyngeal weakness results in enough vallecular residual with nectar to pose equal risk of aspiration post swallow. Recommend pt resume thin liquids and regular solids with SLP interventions in compensatory throat clearing and effortful swallowing. Recommend f/u SLP at next level of care.    Swallow Evaluation Recommendations       SLP Diet Recommendations: Regular solids;Thin liquid   Liquid Administration via: Cup;Straw   Medication Administration: Whole meds with puree   Supervision: Intermittent supervision to cue for compensatory strategies   Compensations: Slow rate;Small sips/bites;Clear throat after each swallow;Multiple dry swallows after each bite/sip   Postural Changes: Seated upright at 90 degrees;Remain semi-upright after after feeds/meals (Comment)   Oral Care Recommendations: Oral care BID        Karmina Zufall, Katherene Ponto 03/11/2017,11:15 AM

## 2017-03-11 NOTE — Progress Notes (Signed)
[place] for Warfarin Indication: atrial fibrillation  Allergies  Allergen Reactions  . Aspirin Anaphylaxis, Shortness Of Breath and Rash    "broke out in white welts; red blotches neck and face; windpipe closing; ~ 1962"    Patient Measurements: Height: 5\' 11"  (180.3 cm) Weight: 167 lb 15.9 oz (76.2 kg) IBW/kg (Calculated) : 75.3  Vital Signs: Temp: 98 F (36.7 C) (01/15 0551) Temp Source: Oral (01/15 0551) BP: 142/55 (01/15 0842) Pulse Rate: 71 (01/15 0842)  Labs: Recent Labs    03/10/17 0853 03/11/17 0554  HGB 8.3*  --   HCT 26.5*  --   PLT 142*  --   LABPROT 35.6* 40.3*  INR 3.60 4.21*  CREATININE 1.35* 1.41*    Estimated Creatinine Clearance: 35.6 mL/min (A) (by C-G formula based on SCr of 1.41 mg/dL (H)).   Medications:  Warfarin PTA dose 2mg  daily except 1mg  on Tuesdays and Thursdays  Assessment: 84 year old male on warfarin prior to admission on atrial fibrillation.   INR up to 4.21 today. Hgb down at 8.3, Platelets 142.   Goal of Therapy:  INR 2-3 Monitor platelets by anticoagulation protocol: Yes   Plan:  No warfarin tonight Daily PT/INR   Renold Genta, PharmD, BCPS Clinical Pharmacist Phone for today - [place] - [phone] 03/11/2017 11:47 AM

## 2017-03-11 NOTE — Progress Notes (Signed)
Notified Dr. Horris Latino that patient refused Echo. They will attempt again later today.

## 2017-03-11 NOTE — Consult Note (Signed)
[place] Nurse wound consult note Reason for Consult: Scrotal edema.  Chf exacerbation Wound type: Scrotal edema, wepeing Pressure Injury POA: NA Measurement:n/a edema Wound bed: n/a Drainage (amount, consistency, odor) scant weeping   Periwound:n/a Dressing procedure/placement/frequency:Barrier cream to scrotum twice daily.  Will not follow at this time.  Please re-consult if needed.  Domenic Moras RN BSN [place] Pager [phone]

## 2017-03-11 NOTE — Progress Notes (Signed)
Patient refused examination and was hostile in nature.  Nurse was alerted of his desires.  Will possibly attempt the echo later if and when the patient becomes more agreeable.

## 2017-03-11 NOTE — Progress Notes (Signed)
Was able to ween patient to room air.

## 2017-03-11 NOTE — Evaluation (Signed)
Physical Therapy Evaluation Patient Details Name: Ronald [name] MRN: [id number] DOB: [birth year] Today's Date: 03/11/2017   History of Present Illness  Pt is a 84 y.o. male admitted from facility on 03/10/17 with SOB; worked up for CHF. CXR consistent with pulmonary edema. PMH includes DM, PVD, CAD, HTN, COPD, a-fib on coumadin, HF, aortic valve replacement, L reverse TSA (12/2016).     Clinical Impression  Pt presents with significant confusion and an overall decrease in functional mobility secondary to above. Per chart, pt admitted from [facility]. Pt unreliable historian, stated that PTA he's from home and does not need help with getting around. Today, pt initially willing to participate in strength and bed mobility evaluation. When attempting OOB activity, pt became very agitated and adamently refused further movement. Pt would benefit from continued acute PT services to maximize functional mobility and independence prior to d/c with SNF-level therapies.     Follow Up Recommendations SNF;Supervision/Assistance - 24 hour    Equipment Recommendations  (TBD next venue)    Recommendations for Other Services       Precautions / Restrictions Precautions Precautions: Fall Restrictions Weight Bearing Restrictions: No      Mobility  Bed Mobility               General bed mobility comments: Initially participating in bed mobility (moving BLEs well, but not to command), but quickly becoming agitated and adamently refusing further mobility  Transfers                    Ambulation/Gait                Stairs            Wheelchair Mobility    Modified Rankin (Stroke Patients Only)       Balance                                             Pertinent Vitals/Pain Pain Assessment: Faces Faces Pain Scale: Hurts a little bit Pain Location: L arm with PROM Pain Descriptors / Indicators: Grimacing Pain Intervention(s): Monitored  during session;Limited activity within patient's tolerance    Home Living Family/patient expects to be discharged to:: Skilled nursing facility                 Additional Comments: Per chart, pt from [facility]    Prior Function Level of Independence: Needs assistance   Gait / Transfers Assistance Needed: Pt unreliable historian, states "I use all that (referring to DME)... I don't need any help". Also mentions he uses electric scooter     Comments: Per PT/OT note 2 months ago, pt able to amb 30 yards with [facility] and uses electric scooter for longer household/community distances     Hand Dominance        Extremity/Trunk Assessment   Upper Extremity Assessment Upper Extremity Assessment: Generalized weakness;LUE deficits/detail LUE Deficits / Details: Pt not actively moving LUE (per chart, s/p L reverse TSA in 12/2016); using RUE to adjust covers and reach for objects on L side    Lower Extremity Assessment Lower Extremity Assessment: Generalized weakness(Pt actively moving BLEs in bed, but declining OOB mobility for further assessment)       Communication   Communication: HOH  Cognition Arousal/Alertness: Awake/alert Behavior During Therapy: Agitated Overall Cognitive Status: No family/caregiver present to determine  baseline cognitive functioning                                 General Comments: Pt becoming very agitated during evaluation of BUE/BLE strength and bed mobility, refusing OOB mobility. Stating "if you do not leave me alone, I'm going to call the authorities"      General Comments      Exercises     Assessment/Plan    PT Assessment Patient needs continued PT services  PT Problem List Decreased strength;Decreased range of motion;Decreased activity tolerance;Decreased balance;Decreased mobility;Decreased cognition;Decreased knowledge of use of DME;Decreased knowledge of precautions;Pain       PT Treatment Interventions DME  instruction;Gait training;Stair training;Functional mobility training;Therapeutic activities;Therapeutic exercise;Balance training;Patient/family education    PT Goals (Current goals can be found in the Care Plan section)  Acute Rehab PT Goals Patient Stated Goal: Get some rest PT Goal Formulation: With patient Time For Goal Achievement: 03/25/17 Potential to Achieve Goals: Good    Frequency Min 2X/week   Barriers to discharge        Co-evaluation               AM-PAC PT "6 Clicks" Daily Activity  Outcome Measure Difficulty turning over in bed (including adjusting bedclothes, sheets and blankets)?: A Little Difficulty moving from lying on back to sitting on the side of the bed? : Unable Difficulty sitting down on and standing up from a chair with arms (e.g., wheelchair, bedside commode, etc,.)?: Unable Help needed moving to and from a bed to chair (including a wheelchair)?: A Lot Help needed walking in hospital room?: A Lot Help needed climbing 3-5 steps with a railing? : A Lot 6 Click Score: 11    End of Session   Activity Tolerance: Treatment limited secondary to agitation Patient left: in bed;with call bell/phone within reach;with bed alarm set Nurse Communication: Mobility status PT Visit Diagnosis: Other abnormalities of gait and mobility (R26.89);Muscle weakness (generalized) (M62.81)    Time: 2751-7001 PT Time Calculation (min) (ACUTE ONLY): 12 min   Charges:   PT Evaluation $PT Eval Moderate Complexity: 1 Mod     PT G Codes:       Mabeline Caras, PT, DPT Acute Rehab Services  Pager: [place] 03/11/2017, 1:12 PM

## 2017-03-11 NOTE — Progress Notes (Signed)
Patient is from SNF; [facility]; SW to follow for disposition needs; Aneta Mins [phone]

## 2017-03-11 NOTE — Progress Notes (Signed)
CRITICAL VALUE ALERT  Critical Value:  INR 4.21  Date & Time Notied:  03/11/17 0800  Provider Notified: Dr. Horris Latino  Orders Received/Actions taken:

## 2017-03-11 NOTE — Progress Notes (Signed)
Attempted to do echo at 10:15am.  Patient was not in the room, performing a swallow test, per nurse.  Will attempt echo later in the day.

## 2017-03-11 NOTE — Clinical Social Work Note (Addendum)
Clinical Social Work Assessment  Patient Details  Name: Ronald [name] MRN: [id number] Date of Birth: 08/13/1922  Date of referral:  03/11/17               Reason for consult:  Facility Placement, Discharge Planning                Permission sought to share information with:  Facility Sport and exercise psychologist, Family Supports Permission granted to share information::  Yes, Verbal Permission Granted  Name::     Delora Fuel  Agency::  [facility]  Relationship::  Son  Contact Information:  [phone]  Housing/Transportation Living arrangements for the past 2 months:  [place] of Information:  Medical Team, Adult Children, Facility Patient Interpreter Needed:  None Criminal Activity/Legal Involvement Pertinent to Current Situation/Hospitalization:  No - Comment as needed Significant Relationships:  Adult Children Lives with:  Facility Resident Do you feel safe going back to the place where you live?  No Need for family participation in patient care:  Yes (Comment)  Care giving concerns:  Patient is a resident at [facility]. PT recommending SNF once medically stable for discharge.   Social Worker assessment / plan:  Patient not fully oriented. CSW called patient's son, introduced role, and explained that PT recommendations would be discussed. Patient's son confirmed he is a resident at [facility]. Discussed SNF placement, daily copays for his insurance as of January 1st, etc. Patient's son will discuss with his siblings before making a decision and let CSW know by tomorrow morning. CSW spoke with ALF and they are agreeable to taking him back if patient's family prefers that. Will need patient to work with PT before insurance will approve for SNF. He refused to continue working with them soon after starting bed mobility. CSW paged MD for OT order as well. No further concerns. CSW encouraged patient's son to contact CSW as needed. CSW will  continue to follow patient and his son for support and facilitate discharge to SNF vs. ALF once medically stable.  Employment status:  Retired Forensic scientist:  Other (Comment Required)([employer]) PT Recommendations:  [place] / Referral to community resources:  [place]  Patient/Family's Response to care:  Patient not fully oriented. Patient's son wants to discuss SNF vs. ALF with his siblings before making a decision. Patient's children supportive and involved in patient's care. Patient's son appreciated social work intervention.  Patient/Family's Understanding of and Emotional Response to Diagnosis, Current Treatment, and Prognosis:  Patient not fully oriented. Patient's son has a good understanding of the reason for admission and his need for continued therapy once discharged. Patient's son appears happy with hospital care.  Emotional Assessment Appearance:  Appears stated age Attitude/Demeanor/Rapport:  Unable to Assess Affect (typically observed):  Unable to Assess Orientation:  Oriented to Self, Oriented to Place Alcohol / Substance use:  Never Used Psych involvement (Current and /or in the community):  No (Comment)  Discharge Needs  Concerns to be addressed:  Care Coordination Readmission within the last 30 days:  No Current discharge risk:  Cognitively Impaired, Dependent with Mobility Barriers to Discharge:  Ship broker, Continued Medical Work up   Candie Chroman, LCSW 03/11/2017, 3:34 PM

## 2017-03-11 NOTE — Progress Notes (Signed)
PROGRESS NOTE  Ronald [name] EPP:[id number] DOB: 11/28/1922 DOA: 03/10/2017 PCP: [name], MD  HPI/Recap of past 24 hours: HPI from [name], NP on 03/10/17 Ronald [name] is a 84 y.o. male with medical history significant for chronic diastolic heart failure, CAD, AS s/p AVR, COPD, depression, hypertension, diabetes, thrombocytopenia, recent hip fracture presents to the ED from [facility] with complaint of shortness of breath for 1 day. Pt does not wear oxygen at baseline, continues to smoke at the facility where he lives when he can get outdoors. Associated symptoms include increased coughing with sputum production as well as lower extremity edema. Denies any other symptoms. In the ED, CXR showed pulm edema. Pt admitted for further management.   Today, met patient sleeping, easily arousable, denies any chest pain, worsening SOB, abdominal pain, N/V, fever/chills   Assessment/Plan: Principal Problem:   Acute respiratory distress Active Problems:   Diabetes mellitus with complication (HCC)   Essential hypertension   AS (aortic stenosis)- s/p tissue AVR 01/07/11   Chronic atrial fibrillation (HCC)   COPD (chronic obstructive pulmonary disease) ([place])   Acute on chronic diastolic (congestive) heart failure (HCC)   Chronic renal insufficiency, stage IV (severe) (HCC)   Anemia   Tobacco use   Skin excoriation  Acute on chronic diastolic heart failure Not in acute distress BNP 333, trop neg, EKG no acute changes Chest x-ray shows bilateral interstitial edema/infiltrate ECHO pending, last ECHO in 2016 with normal EF IV Lasix 40 mg BID, continue metoprolol Strict I & O, daily weights Telemetry inpatient Continue oxygen supplementation prn  COPD Scheduled nebs Continue home meds  Chronic A. Fib Rate controlled, INR supratherapuetic Continue coumadin, metoprolol Coumadin per pharmacy  Skin excoriation/stage 1 breakdown Scrotum and penis with erythema, swelling/tenderness. No signs  of infection WOC consulted  Aortic stenosis Status post aVR in 2012, coumadin  Chronic kidney disease stage IV Creatinine at baseline Daily BMP, avoid nephrotoxics  Anemia of chronic disease Stable, at baseline  Type 2 DM SSI, lantus Hold home meds include Lantus low-dose with oral agents  Hypertension Continue home meds  Tobacco abuse Cessation counselled  Chronic back pain Appears stable at baseline Physical therapy    Code Status: Full  Family Communication: None at bedside  Disposition Plan: Back to SNF   Consultants:  None  Procedures:  None  Antimicrobials:  None  DVT prophylaxis:  Coumadin    Objective: Vitals:   03/11/17 0551 03/11/17 0842 03/11/17 1152 03/11/17 1308  BP: (!) 151/54 (!) 142/55 (!) 126/43   Pulse: 66 71 75 78  Resp: 18   16  Temp: 98 F (36.7 C)  98.1 F (36.7 C)   TempSrc: Oral  Oral   SpO2: 98% 95% 97% 98%  Weight: 76.2 kg (167 lb 15.9 oz)     Height:        Intake/Output Summary (Last 24 hours) at 03/11/2017 1450 Last data filed at 03/11/2017 0916 Gross per 24 hour  Intake 483 ml  Output 1750 ml  Net -1267 ml   Filed Weights   03/10/17 1856 03/11/17 0551  Weight: 77.8 kg (171 lb 8.3 oz) 76.2 kg (167 lb 15.9 oz)    Exam:   General:  Alert, NAD  Cardiovascular: Irregular rate and rhythm, no added hrt sound  Respiratory: Fine bibasilar crackles noted  Abdomen:  Soft, non-tender, non-distended, BS present  Musculoskeletal: 1+ pedal edema b/l  Skin: Scrotum and penis with erythema, minimal edema  Psychiatry: Normal mood  Data Reviewed: CBC: Recent Labs  Lab 03/10/17 0853  WBC 8.9  NEUTROABS 7.1  HGB 8.3*  HCT 26.5*  MCV 96.4  PLT 865*   Basic Metabolic Panel: Recent Labs  Lab 03/10/17 0853 03/11/17 0554  NA 140 142  K 4.1 3.1*  CL 108 104  CO2 24 29  GLUCOSE 132* 62*  BUN 17 16  CREATININE 1.35* 1.41*  CALCIUM 8.7* 8.5*   GFR: Estimated Creatinine Clearance: 35.6  mL/min (A) (by C-G formula based on SCr of 1.41 mg/dL (H)). Liver Function Tests: Recent Labs  Lab 03/10/17 0853  AST 18  ALT 16*  ALKPHOS 79  BILITOT 1.0  PROT 6.6  ALBUMIN 2.8*   No results for input(s): LIPASE, AMYLASE in the last 168 hours. No results for input(s): AMMONIA in the last 168 hours. Coagulation Profile: Recent Labs  Lab 03/10/17 0853 03/11/17 0554  INR 3.60 4.21*   Cardiac Enzymes: No results for input(s): CKTOTAL, CKMB, CKMBINDEX, TROPONINI in the last 168 hours. BNP (last 3 results) No results for input(s): PROBNP in the last 8760 hours. HbA1C: Recent Labs    03/10/17 1200  HGBA1C 5.0   CBG: Recent Labs  Lab 03/10/17 1739 03/10/17 2101 03/11/17 0802 03/11/17 0837 03/11/17 1148  GLUCAP 201* 175* 54* 79 235*   Lipid Profile: No results for input(s): CHOL, HDL, LDLCALC, TRIG, CHOLHDL, LDLDIRECT in the last 72 hours. Thyroid Function Tests: No results for input(s): TSH, T4TOTAL, FREET4, T3FREE, THYROIDAB in the last 72 hours. Anemia Panel: No results for input(s): VITAMINB12, FOLATE, FERRITIN, TIBC, IRON, RETICCTPCT in the last 72 hours. Urine analysis:    Component Value Date/Time   COLORURINE YELLOW 12/13/2013 1556   APPEARANCEUR CLOUDY (A) 12/13/2013 1556   LABSPEC 1.012 12/13/2013 1556   PHURINE 6.0 12/13/2013 1556   GLUCOSEU NEGATIVE 12/13/2013 1556   HGBUR NEGATIVE 12/13/2013 1556   BILIRUBINUR NEGATIVE 12/13/2013 1556   KETONESUR NEGATIVE 12/13/2013 1556   PROTEINUR NEGATIVE 12/13/2013 1556   UROBILINOGEN 1.0 12/13/2013 1556   NITRITE NEGATIVE 12/13/2013 1556   LEUKOCYTESUR NEGATIVE 12/13/2013 1556   Sepsis Labs: _0 (procalcitonin:4,lacticidven:4)  )No results found for this or any previous visit (from the past 240 hour(s)).    Studies: Dg Swallowing Func-speech Pathology  Result Date: 03/11/2017 Objective Swallowing Evaluation: Type of Study: MBS-Modified Barium Swallow Study  Patient Details Name: Ronald [name]  MRN: [id number] Date of Birth: August 13, 1922 Today's Date: 03/11/2017 Time: SLP Start Time (ACUTE ONLY): 1030 -SLP Stop Time (ACUTE ONLY): 1055 SLP Time Calculation (min) (ACUTE ONLY): 25 min Past Medical History: Past Medical History: Diagnosis Date . AS (aortic stenosis)   bovine aortic valve replacement 01/07/11 . Bladder cancer [facility])   Bladder Cancer local . Blood transfusion   w/hip operation . Cellulitis of left lower extremity  . Chronic atrial fibrillation ([place])  . Chronic diastolic congestive heart failure ([place])  . Claudication in peripheral vascular disease ([place]) 06/17/2011 . COPD (chronic obstructive pulmonary disease) ([place])  . Depression wife died 4 years ago.   Marland Kitchen History of stomach ulcers ~ 1951 . HTN (hypertension)  . Neuropathy due to secondary diabetes ([place])  . Peripheral vascular disease ([place]) very poor circulation legs and feet ... stents right and left legs... done in dr j. Gwenlyn Found 's office.  . Pneumonia 07/25/11  left . Recurrent upper respiratory infection (URI)   sinusitis . Renal artery stenosis ([place]) 2006  renal artery stent . S/P angioplasty with stent, diamond back rotational athrectomy Prox. Rt. SFA 06/17/2011 06/17/2011 .  Shortness of breath  . Thrombocytopenia due to drugs   seen by Dr Inda Merlin plts [id number] no rx . Type II diabetes mellitus ([place])  Past Surgical History: Past Surgical History: Procedure Laterality Date . ABDOMINAL ANGIOGRAM  06/17/2011  Procedure: ABDOMINAL ANGIOGRAM;  Surgeon: Lorretta Harp, MD;  Location: [facility] CATH LAB;  Service: Cardiovascular;; . AORTIC VALVE REPLACEMENT  01/07/2011  Procedure: AORTIC VALVE REPLACEMENT (AVR);  Surgeon: Grace Isaac, MD;  Location: [facility];  Service: Open Heart Surgery;  Laterality: N/A;; magna-ease bovine 50m bioprosthesis . ATHERECTOMY N/A 06/17/2011  Procedure: ATHERECTOMY;  Surgeon: JLorretta Harp MD;  [facility];  Service: Cardiovascular;  Laterality: N/A; . CARDIAC CATHETERIZATION  11/19/10  normal coronaries, mod AS, 75% l  RAS . CARDIOVERSION  04/03/2011  Procedure: CARDIOVERSION;  Surgeon: DLeonie Man MD;  [place] [facility]  Service: Cardiovascular;  Laterality: N/A; . CATARACT EXTRACTION W/ INTRAOCULAR LENS  IMPLANT, BILATERAL  ~ 2007 . CHOLECYSTECTOMY   . FEMUR IM NAIL  06/27/2011  Procedure: INTRAMEDULLARY (IM) NAIL FEMORAL;  Surgeon: KMarin Shutter MD;  Location: [facility] ORS;  Service: Orthopedics;  Laterality: Right; . FEMUR IM NAIL Left 12/14/2013  Procedure: INTRAMEDULLARY (IM) NAIL FEMORAL;  Surgeon: MMauri Pole MD;  Location: [facility] ORS;  Service: Orthopedics;  Laterality: Left; . FRACTURE SURGERY   . LOWER EXTREMITY ANGIOGRAM  06/17/2011  diamondback orbital rotational and cutting balloon atherectomy of the prox R SFA . LOWER EXTREMITY ANGIOGRAM Bilateral 06/17/2011  Procedure: LOWER EXTREMITY ANGIOGRAM;  Surgeon: JLorretta Harp MD;  [facility];  Service: Cardiovascular;  Laterality: Bilateral; . PERIPHERAL ARTERIAL STENT GRAFT    2006 left anf right illiac stents Dr BDeon Pilling. RENAL ARTERY STENT  2006  "I believe" . REVERSE SHOULDER ARTHROPLASTY Left 12/31/2016  Procedure: LEFT REVERSE SHOULDER ARTHROPLASTY;  Surgeon: RNicholes Stairs MD;  [place] [facility]  Service: Orthopedics;  Laterality: Left; . TONSILLECTOMY AND ADENOIDECTOMY    "when I was a kid" HPI: JSHARONE ALMONDis a 84y.o. male with medical history significant AF status post bovine aVR, chronic diastolic heart failure, CAD, COPD, depression, hypertension, diabetes, thrombocytopenia recent hip fracture presents to the emergency department from facility with the chief complaint of shortness of breath. Initial evaluation concerning for acute on chronic diastolic heart failure in the setting of COPD. RN noticed coughing with water and pills  No Data Recorded Assessment / Plan / Recommendation CHL IP CLINICAL IMPRESSIONS 03/11/2017 Clinical Impression Pt presents with a moderate oropharyngeal dysphagia with late laryngeal clousre leading to sensed  penetration to the cords during the swallow. Pt clears throat or coughs softly, but not forcefully enough to fully eject penetrate. A cue to fully clearly with friction and swallow again is consistently successfully. Pt is hard of hearing but cognitively in tact to learn this strategy. A chin tuck was not successful and mild pharyngeal weakness results in enough vallecular residual with nectar to pose equal risk of aspiration post swallow. Recommend pt resume thin liquids and regular solids with SLP interventions in compensatory throat clearing and effortful swallowing. Recommend f/u SLP at next level of care.  SLP Visit Diagnosis Dysphagia, oropharyngeal phase (R13.12) Attention and concentration deficit following -- Frontal lobe and executive function deficit following -- Impact on safety and function Moderate aspiration risk   CHL IP TREATMENT RECOMMENDATION 03/11/2017 Treatment Recommendations Therapy as outlined in treatment plan below   Prognosis 03/11/2017 Prognosis for Safe Diet Advancement Good Barriers to Reach Goals --  Barriers/Prognosis Comment -- CHL IP DIET RECOMMENDATION 03/11/2017 SLP Diet Recommendations Regular solids;Thin liquid Liquid Administration via Cup;Straw Medication Administration Whole meds with puree Compensations Slow rate;Small sips/bites;Clear throat after each swallow;Multiple dry swallows after each bite/sip Postural Changes Seated upright at 90 degrees;Remain semi-upright after after feeds/meals (Comment)   CHL IP OTHER RECOMMENDATIONS 03/11/2017 Recommended Consults -- Oral Care Recommendations Oral care BID Other Recommendations --   CHL IP FOLLOW UP RECOMMENDATIONS 03/11/2017 Follow up Recommendations SNF SLP   CHL IP FREQUENCY AND DURATION 03/11/2017 Speech Therapy Frequency (ACUTE ONLY) min 2x/week Treatment Duration 2 weeks      CHL IP ORAL PHASE 03/11/2017 Oral Phase Impaired Oral - Pudding Teaspoon -- Oral - Pudding Cup -- Oral - Honey Teaspoon -- Oral - Honey Cup -- Oral -  Nectar Teaspoon -- Oral - Nectar Cup Lingual/palatal residue Oral - Nectar Straw Lingual/palatal residue Oral - Thin Teaspoon -- Oral - Thin Cup Lingual/palatal residue Oral - Thin Straw -- Oral - Puree Lingual/palatal residue Oral - Mech Soft Lingual/palatal residue Oral - Regular -- Oral - Multi-Consistency -- Oral - Pill WFL Oral Phase - Comment --  CHL IP PHARYNGEAL PHASE 03/11/2017 Pharyngeal Phase Impaired Pharyngeal- Pudding Teaspoon -- Pharyngeal -- Pharyngeal- Pudding Cup -- Pharyngeal -- Pharyngeal- Honey Teaspoon -- Pharyngeal -- Pharyngeal- Honey Cup -- Pharyngeal -- Pharyngeal- Nectar Teaspoon -- Pharyngeal -- Pharyngeal- Nectar Cup Delayed swallow initiation-pyriform sinuses;Reduced airway/laryngeal closure;Reduced tongue base retraction;Penetration/Aspiration during swallow;Pharyngeal residue - valleculae;Penetration/Apiration after swallow Pharyngeal Material enters airway, remains ABOVE vocal cords and not ejected out;Material does not enter airway Pharyngeal- Nectar Straw Delayed swallow initiation-pyriform sinuses;Reduced airway/laryngeal closure;Reduced tongue base retraction;Penetration/Aspiration during swallow;Pharyngeal residue - valleculae;Penetration/Apiration after swallow Pharyngeal Material enters airway, remains ABOVE vocal cords and not ejected out;Material does not enter airway Pharyngeal- Thin Teaspoon -- Pharyngeal -- Pharyngeal- Thin Cup Delayed swallow initiation-pyriform sinuses;Reduced airway/laryngeal closure;Reduced tongue base retraction;Penetration/Aspiration during swallow;Pharyngeal residue - valleculae;Penetration/Aspiration before swallow;Compensatory strategies attempted (with notebox) Pharyngeal Material enters airway, CONTACTS cords and then ejected out;Material enters airway, remains ABOVE vocal cords and not ejected out;Material does not enter airway;Material enters airway, remains ABOVE vocal cords then ejected out Pharyngeal- Thin Straw Delayed swallow  initiation-pyriform sinuses;Reduced airway/laryngeal closure;Reduced tongue base retraction;Penetration/Aspiration during swallow;Pharyngeal residue - valleculae;Penetration/Apiration after swallow;Penetration/Aspiration before swallow Pharyngeal Material does not enter airway;Material enters airway, remains ABOVE vocal cords then ejected out;Material enters airway, remains ABOVE vocal cords and not ejected out;Material enters airway, CONTACTS cords and then ejected out Pharyngeal- Puree Delayed swallow initiation-vallecula;Reduced tongue base retraction;Pharyngeal residue - valleculae Pharyngeal -- Pharyngeal- Mechanical Soft Delayed swallow initiation-vallecula;Pharyngeal residue - valleculae;Reduced tongue base retraction Pharyngeal -- Pharyngeal- Regular -- Pharyngeal -- Pharyngeal- Multi-consistency -- Pharyngeal -- Pharyngeal- Pill -- Pharyngeal -- Pharyngeal Comment --  No flowsheet data found. No flowsheet data found. DeBlois, Katherene Ponto 03/11/2017, 11:16 AM               Scheduled Meds: . buPROPion  150 mg Oral BH-q7a  . doxazosin  4 mg Oral QPM  . ferrous sulfate  325 mg Oral BID  . finasteride  5 mg Oral QHS  . fluticasone  1 spray Each Nare BID  . furosemide  40 mg Intravenous BID  . insulin aspart  0-5 Units Subcutaneous QHS  . insulin aspart  0-9 Units Subcutaneous TID WC  . insulin glargine  10 Units Subcutaneous Daily  . loratadine  10 mg Oral Daily  . metoprolol succinate  50 mg Oral BID WC  . polyethylene glycol  17 g Oral  Daily  . potassium chloride SA  40 mEq Oral BID  . simvastatin  20 mg Oral QPM  . sodium chloride flush  3 mL Intravenous Q12H  . umeclidinium-vilanterol  1 puff Inhalation Daily  . Warfarin - Pharmacist Dosing Inpatient   Does not apply q1800    Continuous Infusions: . sodium chloride       LOS: 1 day     Alma Friendly, MD [facility]   If 7PM-7AM, please contact night-coverage [email or web address] Password [email or web address] 03/11/2017, 2:50 PM

## 2017-03-11 NOTE — Progress Notes (Signed)
Hypoglycemic Event  CBG: 54  Treatment: orange juice, Ensure  Symptoms: Asymptomatic   Follow-up CBG: [id number] CBG Result:79  Possible Reasons for Event: Not eating much.  Comments/MD notified:Notified Dr Sharyne Richters

## 2017-03-12 ENCOUNTER — Inpatient Hospital Stay (HOSPITAL_COMMUNITY): Payer: PPO

## 2017-03-12 DIAGNOSIS — I10 Essential (primary) hypertension: Secondary | ICD-10-CM

## 2017-03-12 DIAGNOSIS — R0600 Dyspnea, unspecified: Secondary | ICD-10-CM

## 2017-03-12 DIAGNOSIS — E118 Type 2 diabetes mellitus with unspecified complications: Secondary | ICD-10-CM

## 2017-03-12 DIAGNOSIS — J449 Chronic obstructive pulmonary disease, unspecified: Secondary | ICD-10-CM

## 2017-03-12 DIAGNOSIS — I482 Chronic atrial fibrillation: Secondary | ICD-10-CM

## 2017-03-12 DIAGNOSIS — R0603 Acute respiratory distress: Secondary | ICD-10-CM

## 2017-03-12 LAB — BASIC METABOLIC PANEL
ANION GAP: 8 (ref 5–15)
BUN: 19 mg/dL (ref 6–20)
CALCIUM: 8.5 mg/dL — AB (ref 8.9–10.3)
CO2: 28 mmol/L (ref 22–32)
Chloride: 106 mmol/L (ref 101–111)
Creatinine, Ser: 1.43 mg/dL — ABNORMAL HIGH (ref 0.61–1.24)
GFR calc Af Amer: 47 mL/min — ABNORMAL LOW (ref >60)
GFR, EST NON AFRICAN AMERICAN: 41 mL/min — AB (ref >60)
GLUCOSE: 82 mg/dL (ref 65–99)
Potassium: 4 mmol/L (ref 3.5–5.1)
SODIUM: 142 mmol/L (ref 135–145)

## 2017-03-12 LAB — PROTIME-INR
INR: 2.56
PROTHROMBIN TIME: 27.3 s — AB (ref 11.4–15.2)

## 2017-03-12 LAB — GLUCOSE, CAPILLARY
GLUCOSE-CAPILLARY: 186 mg/dL — AB (ref 65–99)
GLUCOSE-CAPILLARY: 149 mg/dL — AB (ref 65–99)
Glucose-Capillary: 84 mg/dL (ref 65–99)
Glucose-Capillary: 167 mg/dL — ABNORMAL HIGH (ref 65–99)

## 2017-03-12 LAB — ECHOCARDIOGRAM COMPLETE
Height: 71 in
Weight: 2719.59 oz

## 2017-03-12 MED ORDER — WARFARIN SODIUM 1 MG PO TABS
1.0000 mg | ORAL_TABLET | Freq: Once | ORAL | Status: AC
  Administered 2017-03-12: 1 mg via ORAL
  Filled 2017-03-12: qty 1

## 2017-03-12 MED ORDER — FUROSEMIDE 10 MG/ML IJ SOLN
40.0000 mg | Freq: Every day | INTRAMUSCULAR | Status: DC
  Administered 2017-03-13: 40 mg via INTRAVENOUS
  Filled 2017-03-12: qty 4

## 2017-03-12 MED ORDER — INSULIN GLARGINE 100 UNIT/ML ~~LOC~~ SOLN
5.0000 [IU] | Freq: Every day | SUBCUTANEOUS | Status: DC
  Administered 2017-03-12: 5 [IU] via SUBCUTANEOUS
  Filled 2017-03-12 (×2): qty 0.05

## 2017-03-12 NOTE — Consult Note (Signed)
   [facility] CM Inpatient Consult   03/12/2017  Nochum Fenter [facility] August 01, 1922 [id number]  Patient with a HX with [facility] over a year ago. Patient assessed for admissions and care management needs in the [place] [facility] with [employer] plan.  Met the patient alert and oriented and his son London Tarnowski who states the patient recently moved to [facility] facility.  He had been at [facility] previous to that.  Son states he is not sure of the patient's disposition as to whether or not he will need some rehab before returning to his assisted living. Son did accept a brochure with 24 hour magnet with contact information.  No current needs assessed for patient.   For questions, please contact:  Natividad Brood, RN BSN [facility] Liaison  [phone] business mobile phone Toll free office [phone]

## 2017-03-12 NOTE — Progress Notes (Signed)
Physical Therapy Treatment Patient Details Name: Ronald [name] MRN: [id number] DOB: September 09, 1922 Today's Date: 03/12/2017    History of Present Illness Pt is a 84 y.o. male admitted from facility on 03/10/17 with SOB; worked up for CHF. CXR consistent with pulmonary edema. PMH includes DM, PVD, CAD, HTN, COPD, a-fib on coumadin, HF, aortic valve replacement, L reverse TSA (12/2016).    PT Comments    Pt more appropriately interactive this morning and willing to participate with therapies. Able to transfer and ambulate using RW; slow, very unsteady ambulation requiring min-modA to correct intermittent LOB. Pt with decreased safety awareness and postural reactions. Continue to recommend SNF-level therapies at d/c to maximize functional mobility and decrease caregiver burden. Will continue to follow acutely.   Follow Up Recommendations  SNF;Supervision/Assistance - 24 hour     Equipment Recommendations  (TBD next venue)    Recommendations for Other Services       Precautions / Restrictions Precautions Precautions: Fall Restrictions Weight Bearing Restrictions: No    Mobility  Bed Mobility Overal bed mobility: Needs Assistance Bed Mobility: Supine to Sit     Supine to sit: Min guard;HOB elevated     General bed mobility comments: Increased time and effort; cues to use LUE with initial mobility, but able to use BUEs well to scoot towards EOB  Transfers Overall transfer level: Needs assistance Equipment used: Rolling walker (2 wheeled) Transfers: Sit to/from Stand Sit to Stand: Min assist         General transfer comment: Stood x2 with RW and minA to assist trunk elevation, steady RW, and prevent LOB as pt with significant posterior lean when standing  Ambulation/Gait Ambulation/Gait assistance: Min assist;Mod assist Ambulation Distance (Feet): 10 Feet Assistive device: Rolling walker (2 wheeled) Gait Pattern/deviations: Step-through pattern;Decreased stride length;Trunk  flexed;Staggering right;Staggering left Gait velocity: Decreased Gait velocity interpretation: <1.8 ft/sec, indicative of risk for recurrent falls General Gait Details: Slow, unsteady amb with RW, requiring min-modA to correct LOB and steady RW. Pt with decreased safety awareness using RW, requiring cues to maintain proximity and not step outside of it   Stairs            Wheelchair Mobility    Modified Rankin (Stroke Patients Only)       Balance Overall balance assessment: Needs assistance   Sitting balance-Leahy Scale: Fair   Postural control: Posterior lean   Standing balance-Leahy Scale: Poor Standing balance comment: Posterior lean with decreased balance reactions while standing; reliant on UE support and min-modA to correct LOB                            Cognition Arousal/Alertness: Awake/alert Behavior During Therapy: WFL for tasks assessed/performed Overall Cognitive Status: No family/caregiver present to determine baseline cognitive functioning Area of Impairment: Safety/judgement;Memory;Attention                   Current Attention Level: Selective Memory: Decreased short-term memory   Safety/Judgement: Decreased awareness of safety;Decreased awareness of deficits     General Comments: Pt more alert and appropriately interactive this session. Some deficits with short-term memory noted      Exercises      General Comments        Pertinent Vitals/Pain Pain Assessment: Faces Faces Pain Scale: Hurts a little bit Pain Location: L arm with PROM Pain Descriptors / Indicators: Grimacing;Discomfort Pain Intervention(s): Monitored during session    Home Living  Prior Function            PT Goals (current goals can now be found in the care plan section) Acute Rehab PT Goals Patient Stated Goal: Get some rest PT Goal Formulation: With patient Time For Goal Achievement: 03/25/17 Potential to Achieve  Goals: Good Progress towards PT goals: Progressing toward goals    Frequency    Min 2X/week      PT Plan Current plan remains appropriate    Co-evaluation PT/OT/SLP Co-Evaluation/Treatment: Yes Reason for Co-Treatment: Necessary to address cognition/behavior during functional activity;To address functional/ADL transfers PT goals addressed during session: Mobility/safety with mobility;Balance;Proper use of DME        AM-PAC PT "6 Clicks" Daily Activity  Outcome Measure  Difficulty turning over in bed (including adjusting bedclothes, sheets and blankets)?: A Little Difficulty moving from lying on back to sitting on the side of the bed? : A Little Difficulty sitting down on and standing up from a chair with arms (e.g., wheelchair, bedside commode, etc,.)?: Unable Help needed moving to and from a bed to chair (including a wheelchair)?: A Little Help needed walking in hospital room?: A Lot Help needed climbing 3-5 steps with a railing? : A Lot 6 Click Score: 14    End of Session Equipment Utilized During Treatment: Gait belt Activity Tolerance: Patient tolerated treatment well Patient left: in chair;with call bell/phone within reach;with chair alarm set Nurse Communication: Mobility status PT Visit Diagnosis: Other abnormalities of gait and mobility (R26.89);Muscle weakness (generalized) (M62.81)     Time: 9518-8416 PT Time Calculation (min) (ACUTE ONLY): 28 min  Charges:  $Gait Training: 8-22 mins                    G Codes:      Mabeline Caras, PT, DPT Acute Rehab Services  Pager: [place] 03/12/2017, 9:50 AM

## 2017-03-12 NOTE — Progress Notes (Addendum)
PROGRESS NOTE    Ronald [name]  HQI:[id number] DOB: May 07, 1922 DOA: 03/10/2017 PCP: [name], MD    Brief Narrative:  84 year old male who presented with dyspnea. Patient does have significant past medical history for atrial fibrillation, status post tissue aortic valve replacement, chronic diastolic heart failure, hypertension, COPD, ongoing tobacco abuse, depression, and recent hip fracture. He complained of worsening dyspnea associated with lower extremity edema. On initial physical examination blood pressure 160/82, heart rate 84, respiratory rate 23, temperature 97.8, oxygen saturation is 96%. Moist mucous membranes, heart S1-S2 present irregularly irregular, lungs with bilateral rales and wheezing, positive accessory muscle use, abdomen was soft nontender, lower extremities with 1+ pitting edema bilaterally. Chest x-ray had cardiomegaly with increased vascular congestion bilaterally, soft tissue and alveolar infiltrate. EKG 84 bpm, atrial fibrillation, prolonged QRS with intraventricular conduction delay.   Patient was admitted to the hospital working diagnosis acute hypoxic respiratory failure related to cardiogenic pulmonary edema.    Assessment & Plan:   Principal Problem:   Acute respiratory distress Active Problems:   Diabetes mellitus with complication (HCC)   Essential hypertension   AS (aortic stenosis)- s/p tissue AVR 01/07/11   Chronic atrial fibrillation (HCC)   COPD (chronic obstructive pulmonary disease) (HCC)   Acute on chronic diastolic (congestive) heart failure (HCC)   Chronic renal insufficiency, stage IV (severe) (HCC)   Anemia   Tobacco use   Skin excoriation   1. Acute diastolic heart failure decompensation complicated by syncope. Patient continue to be volume overloaded, will continue diuresis with furosemide, urine output 2000 over last 24 hours, will continue heart failure management with metoprolol.   2. Acute cardiogenic pulmonary edema. Clinically  improved, will need to continue diuresis, will continue oxymetry monitoring and supplemental 02 per [place].   3. Atrial fibrillation.  Chronic, will continue rate control with metoprolol, anticoagulation with warfarin,   4. COPD. Stable with no signs of exacerbation, will continue bronchodilator therapy as needed  5. Chronic kidney disease stage IV. Renal function stable, will continue diuresis with furosemide. Serum cr at 1,43 with K at 4.0 and serum bicarb at 28, follow on renal panel in am.    6. Type 2 diabetes mellitus. Will continue glucose cover and monitoring with insulin sliding scale, basal insulin of 10 units of glargine will be decreased to 5 units to prevent hypoglycemia. Capillary glucose 110, 219, 84, 167, 186.   7. Hypertension. Continue blood pressure control.   DVT prophylaxis: warfarin  Code Status: full Family Communication: No family at bedside Disposition Plan: home/ snf   Consultants:     Procedures:     Antimicrobials:       Subjective: Patient is feeling better, dyspnea and lower extremity edema, has been out of bed, no nausea or vomiting, no chest pain.   Objective: Vitals:   03/11/17 1917 03/12/17 0504 03/12/17 0829 03/12/17 1138  BP: (!) 117/41 (!) 120/53 (!) 121/44 (!) 142/59  Pulse: 79 75 75 76  Resp: 18 18  20   Temp: 99.8 F (37.7 C) 98 F (36.7 C)  98 F (36.7 C)  TempSrc: Oral Oral  Oral  SpO2: 98% 98%  96%  Weight:  77.1 kg (169 lb 15.6 oz)    Height:        Intake/Output Summary (Last 24 hours) at 03/12/2017 1443 Last data filed at 03/12/2017 1016 Gross per 24 hour  Intake 303 ml  Output 1900 ml  Net -1597 ml   Filed Weights   03/10/17 1856 03/11/17  2637 03/12/17 0504  Weight: 77.8 kg (171 lb 8.3 oz) 76.2 kg (167 lb 15.9 oz) 77.1 kg (169 lb 15.6 oz)    Examination:   General: Not in pain or dyspnea, deconditioned Neurology: Awake and alert, non focal  E ENT: mild pallor, no icterus, oral mucosa moist Cardiovascular: No  JVD. S1-S2 present, rhythmic, no gallops, rubs, or murmurs. 1+ pitting lower extremity edema. Pulmonary: decreased breath sounds bilaterally at bases, adequate air movement, no wheezing, rhonchi. Scattered rales. Gastrointestinal. Abdomen flat, no organomegaly, non tender, no rebound or guarding Skin. No rashes Musculoskeletal: no joint deformities     Data Reviewed: I have personally reviewed following labs and imaging studies  CBC: Recent Labs  Lab 03/10/17 0853  WBC 8.9  NEUTROABS 7.1  HGB 8.3*  HCT 26.5*  MCV 96.4  PLT 858*   Basic Metabolic Panel: Recent Labs  Lab 03/10/17 0853 03/11/17 0554 03/12/17 0713  NA 140 142 142  K 4.1 3.1* 4.0  CL 108 104 106  CO2 24 29 28   GLUCOSE 132* 62* 82  BUN 17 16 19   CREATININE 1.35* 1.41* 1.43*  CALCIUM 8.7* 8.5* 8.5*   GFR: Estimated Creatinine Clearance: 35.1 mL/min (A) (by C-G formula based on SCr of 1.43 mg/dL (H)). Liver Function Tests: Recent Labs  Lab 03/10/17 0853  AST 18  ALT 16*  ALKPHOS 79  BILITOT 1.0  PROT 6.6  ALBUMIN 2.8*   No results for input(s): LIPASE, AMYLASE in the last 168 hours. No results for input(s): AMMONIA in the last 168 hours. Coagulation Profile: Recent Labs  Lab 03/10/17 0853 03/11/17 0554 03/12/17 0713  INR 3.60 4.21* 2.56   Cardiac Enzymes: No results for input(s): CKTOTAL, CKMB, CKMBINDEX, TROPONINI in the last 168 hours. BNP (last 3 results) No results for input(s): PROBNP in the last 8760 hours. HbA1C: Recent Labs    03/10/17 1200  HGBA1C 5.0   CBG: Recent Labs  Lab 03/11/17 1148 03/11/17 1641 03/11/17 2037 03/12/17 0738 03/12/17 1133  GLUCAP 235* 110* 219* 84 167*   Lipid Profile: No results for input(s): CHOL, HDL, LDLCALC, TRIG, CHOLHDL, LDLDIRECT in the last 72 hours. Thyroid Function Tests: No results for input(s): TSH, T4TOTAL, FREET4, T3FREE, THYROIDAB in the last 72 hours. Anemia Panel: No results for input(s): VITAMINB12, FOLATE, FERRITIN, TIBC,  IRON, RETICCTPCT in the last 72 hours.    Radiology Studies: I have reviewed all of the imaging during this hospital visit personally     Scheduled Meds: . buPROPion  150 mg Oral BH-q7a  . doxazosin  4 mg Oral QPM  . ferrous sulfate  325 mg Oral BID  . finasteride  5 mg Oral QHS  . fluticasone  1 spray Each Nare BID  . furosemide  40 mg Intravenous BID  . insulin aspart  0-5 Units Subcutaneous QHS  . insulin aspart  0-9 Units Subcutaneous TID WC  . insulin glargine  10 Units Subcutaneous Daily  . loratadine  10 mg Oral Daily  . metoprolol succinate  50 mg Oral BID WC  . polyethylene glycol  17 g Oral Daily  . simvastatin  20 mg Oral QPM  . sodium chloride flush  3 mL Intravenous Q12H  . umeclidinium-vilanterol  1 puff Inhalation Daily  . warfarin  1 mg Oral ONCE-1800  . Warfarin - Pharmacist Dosing Inpatient   Does not apply q1800   Continuous Infusions: . sodium chloride       LOS: 2 days  Mauricio Gerome Apley, MD [facility] Pager [phone]

## 2017-03-12 NOTE — Evaluation (Signed)
Occupational Therapy Evaluation Patient Details Name: Ronald [name] MRN: [id number] DOB: 02/01/1923 Today's Date: 03/12/2017    History of Present Illness Pt is a 84 y.o. male admitted from facility on 03/10/17 with SOB; worked up for CHF. CXR consistent with pulmonary edema. PMH includes DM, PVD, CAD, HTN, COPD, a-fib on coumadin, HF, aortic valve replacement, L reverse TSA (12/2016).    Clinical Impression   Pt is assisted at his ALF with ADL and walks with assist short distances with a RW. Pt presents with decreased shoulder movement with pain, generalized weakness, decreased balance and impaired cognition. Pt is very HOH. Will benefit from further therapy in SNF prior to return to ALF. Will follow.   Follow Up Recommendations  SNF;Supervision/Assistance - 24 hour    Equipment Recommendations       Recommendations for Other Services       Precautions / Restrictions Precautions Precautions: Fall Restrictions Weight Bearing Restrictions: No      Mobility Bed Mobility Overal bed mobility: Needs Assistance Bed Mobility: Supine to Sit     Supine to sit: Min assist     General bed mobility comments: increased time, pulled up on therapists hand to raise trunk  Transfers Overall transfer level: Needs assistance Equipment used: Rolling walker (2 wheeled) Transfers: Sit to/from Stand Sit to Stand: Min assist         General transfer comment: Stood x2 with RW and minA to assist trunk elevation, steady RW, and prevent LOB as pt with significant posterior lean when standing    Balance Overall balance assessment: Needs assistance   Sitting balance-Leahy Scale: Fair   Postural control: Posterior lean   Standing balance-Leahy Scale: Poor Standing balance comment: Posterior lean with decreased balance reactions while standing; reliant on UE support and min-modA to correct LOB                           ADL either performed or assessed with clinical judgement    ADL Overall ADL's : Needs assistance/impaired Eating/Feeding: Set up;Sitting   Grooming: Wash/dry hands;Wash/dry face;Oral care;Sitting;Set up   Upper Body Bathing: Minimal assistance;Sitting   Lower Body Bathing: Moderate assistance;Sit to/from stand   Upper Body Dressing : Minimal assistance;Sitting   Lower Body Dressing: Moderate assistance;Sit to/from stand   Toilet Transfer: Moderate assistance;Ambulation;RW   Toileting- Clothing Manipulation and Hygiene: Moderate assistance;Sit to/from stand       Functional mobility during ADLs: Moderate assistance;Rolling walker       Vision Baseline Vision/History: No visual deficits       Perception     Praxis      Pertinent Vitals/Pain Pain Assessment: Faces Faces Pain Scale: Hurts a little bit Pain Location: L arm with PROM Pain Descriptors / Indicators: Grimacing;Discomfort Pain Intervention(s): Repositioned;Limited activity within patient's tolerance     Hand Dominance Right   Extremity/Trunk Assessment Upper Extremity Assessment Upper Extremity Assessment: LUE deficits/detail;RUE deficits/detail RUE Deficits / Details: generalized weakness LUE Deficits / Details: AAROM FF and abd to 90 degrees, full elbow to hand LUE: Unable to fully assess due to pain LUE Coordination: decreased gross motor   Lower Extremity Assessment Lower Extremity Assessment: Defer to PT evaluation       Communication Communication Communication: HOH(R ear is better)   Cognition Arousal/Alertness: Awake/alert Behavior During Therapy: WFL for tasks assessed/performed Overall Cognitive Status: No family/caregiver present to determine baseline cognitive functioning Area of Impairment: Safety/judgement;Memory  Current Attention Level: Selective Memory: Decreased short-term memory   Safety/Judgement: Decreased awareness of safety;Decreased awareness of deficits     General Comments: Pt more alert and  appropriately interactive this session. Some deficits with short-term memory noted   General Comments       Exercises     Shoulder Instructions      Home Living Family/patient expects to be discharged to:: Skilled nursing facility                                 Additional Comments: Per chart, pt from [facility].      Prior Functioning/Environment Level of Independence: Needs assistance  Gait / Transfers Assistance Needed: Pt unreliable historian, states "I use all that (referring to DME)... I don't need any help". Also mentions he uses Transport planner ADL's / Homemaking Assistance Needed: assisted for ADL, meds, all IADL at ALF   Comments: Per PT/OT note 2 months ago, pt able to amb 30 yards with [facility] and uses electric scooter for longer household/community distances        OT Problem List: Decreased strength;Decreased activity tolerance;Impaired balance (sitting and/or standing);Decreased coordination;Decreased cognition;Decreased safety awareness;Decreased knowledge of use of DME or AE;Pain;Impaired UE functional use      OT Treatment/Interventions: Self-care/ADL training;DME and/or AE instruction;Balance training;Therapeutic activities;Therapeutic exercise    OT Goals(Current goals can be found in the care plan section) Acute Rehab OT Goals Patient Stated Goal: willing to receive rehab  OT Goal Formulation: With patient Time For Goal Achievement: 03/26/17 Potential to Achieve Goals: Fair ADL Goals Pt Will Perform Grooming: with min assist;standing(one activity) Pt Will Perform Upper Body Dressing: with supervision;sitting Pt Will Perform Lower Body Dressing: with min assist;sit to/from stand Pt Will Transfer to Toilet: with min assist;ambulating;bedside commode Pt Will Perform Toileting - Clothing Manipulation and hygiene: with min assist;sit to/from stand Pt/caregiver will Perform Home Exercise Program: Increased ROM;Left upper extremity;With  minimal assist  OT Frequency: Min 2X/week   Barriers to D/C:            Co-evaluation   Reason for Co-Treatment: Necessary to address cognition/behavior during functional activity;To address functional/ADL transfers PT goals addressed during session: Mobility/safety with mobility;Balance;Proper use of DME        AM-PAC PT "6 Clicks" Daily Activity     Outcome Measure Help from another person eating meals?: A Little Help from another person taking care of personal grooming?: A Little Help from another person toileting, which includes using toliet, bedpan, or urinal?: A Lot Help from another person bathing (including washing, rinsing, drying)?: A Lot Help from another person to put on and taking off regular upper body clothing?: A Little Help from another person to put on and taking off regular lower body clothing?: A Lot 6 Click Score: 15   End of Session Equipment Utilized During Treatment: Rolling walker;Gait belt Nurse Communication: Mobility status  Activity Tolerance: Patient tolerated treatment well Patient left: in chair;with call bell/phone within reach;with chair alarm set  OT Visit Diagnosis: Unsteadiness on feet (R26.81);Other abnormalities of gait and mobility (R26.89);Pain;History of falling (Z91.81);Muscle weakness (generalized) (M62.81);Other symptoms and signs involving cognitive function Pain - Right/Left: Left Pain - part of body: Shoulder                Time: 7829-5621 OT Time Calculation (min): 34 min Charges:  OT General Charges $OT Visit: 1 Visit OT Evaluation $OT Eval Moderate Complexity: 1  Mod G-Codes:     03/12/2017 Nestor Lewandowsky, OTR/L Pager: [phone]  Werner Lean Haze Boyden 03/12/2017, 10:05 AM

## 2017-03-12 NOTE — Clinical Social Work Note (Addendum)
CSW received voicemail from patient's son this morning. He stated he was told patient is more lucid today. Patient's daughter is waiting on a phone call from [facility] to see if they can manage his needs at ALF or if they think he needs to go to SNF. Patient's son will call back once there is an update.  Ronald [name], [place]  2:03 pm CSW called patient's son. No updates from [facility] yet. He said that when he spoke with patient this morning he does want to return to [facility] so they are waiting to make sure they can manage him.  Ronald [name], [place]

## 2017-03-12 NOTE — Progress Notes (Signed)
  Speech Language Pathology Treatment: Dysphagia  Patient Details Name: Ronald [name] MRN: [id number] DOB: 1922/12/17 Today's Date: 03/12/2017 Time: 1245-1300 SLP Time Calculation (min) (ACUTE ONLY): 15 min  Assessment / Plan / Recommendation Clinical Impression  Skilled treatment session focused on dysphagia goals. SLP facilitated session by providing skilled observation of pt consuming regular snack with thin liquids. Pt required Min A verbal cues to perform throat clears and/or effortful swallows. Pt with no change in vitals during consumption. Continue current diet, plan of care.    HPI HPI: Ronald [name] is a 84 y.o. male with medical history significant AF status post bovine aVR, chronic diastolic heart failure, CAD, COPD, depression, hypertension, diabetes, thrombocytopenia recent hip fracture presents to the emergency department from facility with the chief complaint of shortness of breath. Initial evaluation concerning for acute on chronic diastolic heart failure in the setting of COPD. RN noticed coughing with water and pills      SLP Plan  Continue with current plan of care       Recommendations  Diet recommendations: Regular;Thin liquid Liquids provided via: Cup Medication Administration: Whole meds with puree Supervision: Patient able to self feed;Full supervision/cueing for compensatory strategies Compensations: Slow rate;Small sips/bites;Clear throat after each swallow;Multiple dry swallows after each bite/sip Postural Changes and/or Swallow Maneuvers: Seated upright 90 degrees                Oral Care Recommendations: Oral care BID Follow up Recommendations: Home health SLP SLP Visit Diagnosis: Dysphagia, oropharyngeal phase (R13.12) Plan: Continue with current plan of care       GO                Ronald [name] 03/12/2017, 1:13 PM

## 2017-03-12 NOTE — Progress Notes (Signed)
[facility] for Warfarin Indication: atrial fibrillation  Allergies  Allergen Reactions  . Aspirin Anaphylaxis, Shortness Of Breath and Rash    "broke out in white welts; red blotches neck and face; windpipe closing; ~ 1962"    Patient Measurements: Height: 5\' 11"  (180.3 cm) Weight: 169 lb 15.6 oz (77.1 kg) IBW/kg (Calculated) : 75.3  Vital Signs: Temp: 98 F (36.7 C) (01/16 0504) Temp Source: Oral (01/16 0504) BP: 121/44 (01/16 0829) Pulse Rate: 75 (01/16 0829)  Labs: Recent Labs    03/10/17 0853 03/11/17 0554 03/12/17 0713  HGB 8.3*  --   --   HCT 26.5*  --   --   PLT 142*  --   --   LABPROT 35.6* 40.3* 27.3*  INR 3.60 4.21* 2.56  CREATININE 1.35* 1.41* 1.43*    Estimated Creatinine Clearance: 35.1 mL/min (A) (by C-G formula based on SCr of 1.43 mg/dL (H)).   Medications:  Warfarin PTA dose 2mg  daily except 1mg  on Tuesdays and Thursdays - last dose 1/13 PTA  Assessment: 84 year old male on warfarin prior to admission on atrial fibrillation. INR elevated at 3.6 on admit.  INR up to 4.21 on 1/15 and warfarin held, now back down to 2.56 in therapeutic range. Hg 8.3, plt 142. No bleed documented.  Goal of Therapy:  INR 2-3 Monitor platelets by anticoagulation protocol: Yes   Plan:  Warfarin 1mg  PO x 1 tonight Monitor daily PT/INR, CBC, s/sx bleeding   Elicia Lamp, PharmD, BCPS Clinical Pharmacist Clinical phone for 03/12/2017 until 3:30pm: [phone] If after 3:30pm, please call main pharmacy at: [phone] 03/12/2017 10:08 AM

## 2017-03-12 NOTE — Progress Notes (Signed)
Notified Dr. Cathlean Sauer pt had an 8 beat run of Vtach. Pt is resting comfortably in bed.

## 2017-03-13 DIAGNOSIS — Z720 Tobacco use: Secondary | ICD-10-CM

## 2017-03-13 LAB — GLUCOSE, CAPILLARY
GLUCOSE-CAPILLARY: 195 mg/dL — AB (ref 65–99)
Glucose-Capillary: 124 mg/dL — ABNORMAL HIGH (ref 65–99)

## 2017-03-13 LAB — BASIC METABOLIC PANEL
ANION GAP: 9 (ref 5–15)
BUN: 21 mg/dL — ABNORMAL HIGH (ref 6–20)
CALCIUM: 8.6 mg/dL — AB (ref 8.9–10.3)
CO2: 28 mmol/L (ref 22–32)
Chloride: 104 mmol/L (ref 101–111)
Creatinine, Ser: 1.54 mg/dL — ABNORMAL HIGH (ref 0.61–1.24)
GFR calc non Af Amer: 37 mL/min — ABNORMAL LOW (ref >60)
GFR, EST AFRICAN AMERICAN: 43 mL/min — AB (ref >60)
GLUCOSE: 93 mg/dL (ref 65–99)
POTASSIUM: 3.6 mmol/L (ref 3.5–5.1)
Sodium: 141 mmol/L (ref 135–145)

## 2017-03-13 LAB — PROTIME-INR
INR: 1.86
Prothrombin Time: 21.3 seconds — ABNORMAL HIGH (ref 11.4–15.2)

## 2017-03-13 MED ORDER — TRAMADOL HCL 50 MG PO TABS: 50.0000 mg | ORAL_TABLET | Freq: Two times a day (BID) | ORAL | 0 refills | Status: DC | PRN

## 2017-03-13 MED ORDER — WARFARIN SODIUM 2 MG PO TABS: 2.0000 mg | ORAL_TABLET | Freq: Once | ORAL | Status: DC

## 2017-03-13 NOTE — Clinical Social Work Note (Addendum)
Discharge summary and FL2 (not signed by MD) faxed to ALF. Left voicemail for wellness office to notify and asked for call back once they review.  [name] Scrape, CSW [phone]  1:15 pm ALF stated discharge and FL2 look good. Paged MD to cosign FL2. Will need this done before patient can leave. Per RN, daughter is here to transport him back to the facility.  [name] Scrape, [place]

## 2017-03-13 NOTE — Discharge Summary (Addendum)
Physician Discharge Summary  ISRAEL WERTS AVW:[id number] DOB: [birth year] DOA: 03/10/2017  PCP: [name], MD  Admit date: 03/10/2017 Discharge date: 03/13/2017  Admitted From: ALF Disposition:  ALF  Recommendations for Outpatient Follow-up:  1. Follow up with PCP in 1- week 2. Continue furosemide for diuresis 3. Please follow PT/ INR 01/22  Home Health: Na  Equipment/Devices: Na   Discharge Condition: Stable CODE STATUS: Full  Diet recommendation: Heart healthy and diabetic prudent  Brief/Interim Summary: 84 year old male who presented with dyspnea. Patient does have significant past medical history for atrial fibrillation, status post tissue aortic valve replacement, chronic diastolic heart failure, hypertension, COPD, ongoing tobacco abuse, depression, and recent hip fracture. He complained of worsening dyspnea associated with lower extremity edema. On initial physical examination blood pressure 160/82, heart rate 84, respiratory rate 23, temperature 97.8, oxygen saturation is 96%. Moist mucous membranes, heart S1-S2 present irregularly irregular, lungs with bilateral rales and wheezing, positive accessory muscle use, abdomen was soft nontender, lower extremities with 1+ pitting edema bilaterally. Chest x-ray had cardiomegaly with increased vascular congestion bilaterally, soft tissue and alveolar infiltrate. EKG 84 bpm, atrial fibrillation, prolonged QRS with intraventricular conduction delay.   Patient was admitted to the hospital working diagnosis acute hypoxic respiratory failure related to cardiogenic pulmonary edema.   1. Acute on chronic diastolic heart failure decompensation. Patient was admitted to the medical ward, he was placed on a remote telemetry monitor, he was aggressively diuresed with intravenous furosemide, negative fluid balance was achieved with significant improvement of his symptoms. Fluid balance negative 4,749 ml at discharge. Patient will continue heart  failure regimen with metoprolol succinate and benazepril, daily furosemide 40 mg daily. Echocardiography with left ventricle ejection fraction 60-65%, with no wall motion abnormalities.   2. Acute cardiogenic pulmonary edema. Patient responded well to diuresis, dyspnea improved, oximetry by the time of discharge 97% on room air.  3. Chronic atrial fibrillation. Remained rate control with metoprolol, anticoagulation with warfarin, discharge INR 1.86.   4. Chronic kidney disease stage III. Baseline creatinine 1.5 with a calculated GFR of 40. Patient tolerated well diuresis, potassium 3.6 with creatinine 1.4 at discharge. Patient will continue taking furosemide and potassium supplements, patient will need close follow-up kidney function and electrolytes.   5. Type 2 diabetes mellitus. Patient was placed on insulin sliding-scale for glucose coverage and monitoring, his basal dose of insulin glargine was decreased by 50% during his hospitalization. Capillary glucose 219, 84, 167, 186, 149, 124. At discharge he will resume his regular insulin regimen plus glyburide.   6. Hypertension. Blood pressure remained well-controlled continue metoprolol. Systolic blood pressure at time of discharge 829-562 systolic.   7. BPH. No signs of urinary retention, continue finasteride and doxazosin.   8. Dyslipidemia. Continue statin therapy with simvastatin.  9. COPD. It remained stable no signs of exacerbation   Discharge Diagnoses:  Principal Problem:   Acute respiratory distress Active Problems:   Diabetes mellitus with complication (HCC)   Essential hypertension   AS (aortic stenosis)- s/p tissue AVR 01/07/11   Chronic atrial fibrillation (HCC)   COPD (chronic obstructive pulmonary disease) (HCC)   Acute on chronic diastolic (congestive) heart failure (HCC)   Chronic renal insufficiency, stage IV (severe) (HCC)   Anemia   Tobacco use   Skin excoriation    Discharge Instructions   Allergies as of  03/13/2017      Reactions   Aspirin Anaphylaxis, Shortness Of Breath, Rash   "broke out in white welts; red blotches neck  and face; windpipe closing; ~ 1962"      Medication List    TAKE these medications   acetaminophen 325 MG tablet Commonly known as:  TYLENOL Take 650 mg by mouth every 6 (six) hours as needed for mild pain.   albuterol 0.63 MG/3ML nebulizer solution Commonly known as:  ACCUNEB Take 3 mLs by nebulization every 6 (six) hours as needed for wheezing or shortness of breath.   alendronate 70 MG tablet Commonly known as:  FOSAMAX Take 70 mg by mouth every 14 (fourteen) days. Take every other sunday   ANORO ELLIPTA 62.5-25 MCG/INH Aepb Generic drug:  umeclidinium-vilanterol Inhale 1 puff into the lungs daily.   benazepril 20 MG tablet Commonly known as:  LOTENSIN Take 20 mg by mouth daily.   buPROPion 150 MG 24 hr tablet Commonly known as:  WELLBUTRIN XL Take 150 mg by mouth daily.   doxazosin 4 MG tablet Commonly known as:  CARDURA Take 4 mg by mouth daily.   ferrous sulfate 325 (65 FE) MG EC tablet Take 1 tablet (325 mg total) by mouth 2 (two) times daily.   finasteride 5 MG tablet Commonly known as:  PROSCAR Take 5 mg by mouth at bedtime.   fluticasone 50 MCG/ACT nasal spray Commonly known as:  FLONASE Place 1 spray into both nostrils 2 (two) times daily.   furosemide 40 MG tablet Commonly known as:  LASIX Take 1 tablet (40 mg total) by mouth daily.   glyBURIDE 5 MG tablet Commonly known as:  DIABETA Take 2.5-10 mg by mouth See admin instructions. 10mg  by mouth in the morning. 2.5mg  in the evening   Insulin Glargine 100 UNIT/ML Solostar Pen Commonly known as:  LANTUS Inject 10 Units into the skin daily.   loratadine 10 MG tablet Commonly known as:  CLARITIN Take 10 mg by mouth daily.   Melatonin 5 MG Tabs Take 5 mg by mouth at bedtime.   metoprolol succinate 50 MG 24 hr tablet Commonly known as:  TOPROL-XL Take 50 mg by mouth 2 (two)  times daily. Take with or immediately following a meal.   polyethylene glycol packet Commonly known as:  MIRALAX / GLYCOLAX Take 17 g by mouth daily.   potassium chloride SA 20 MEQ tablet Commonly known as:  K-DUR,KLOR-CON Take 20 mEq by mouth daily.   simvastatin 20 MG tablet Commonly known as:  ZOCOR Take 20 mg by mouth every evening.   traMADol 50 MG tablet Commonly known as:  ULTRAM Take 1 tablet (50 mg total) by mouth every 12 (twelve) hours as needed for moderate pain.   warfarin 2 MG tablet Commonly known as:  COUMADIN Take 1-2 mg by mouth See admin instructions. 1mg  by mouth once daily in the evening on Tuesday and Thursday 2mg  by mouth once daily in the evening on Monday, Wednesday, Friday, Saturday, Sunday       Allergies  Allergen Reactions  . Aspirin Anaphylaxis, Shortness Of Breath and Rash    "broke out in white welts; red blotches neck and face; windpipe closing; ~ 1962"    Consultations:     Procedures/Studies: Dg Chest 2 View  Result Date: 03/10/2017 CLINICAL DATA:  Sob,hx copd,htn EXAM: CHEST - 2 VIEW COMPARISON:  11/01/2014 FINDINGS: Bilateral interstitial edema/infiltrates predominately perihilar and infrahilar, new since previous. Heart size upper limits normal. Small bilateral pleural effusions have developed. No pneumothorax. Previous AVR, left atrial clipping, and median sternotomy. Interval placement of left shoulder arthroplasty hardware. IMPRESSION: 1. New small pleural effusions  and bilateral interstitial edema/infiltrates, suggesting CHF. Electronically Signed   By: Lucrezia Europe M.D.   On: 03/10/2017 10:52   Dg Swallowing Func-speech Pathology  Result Date: 03/11/2017 Objective Swallowing Evaluation: Type of Study: MBS-Modified Barium Swallow Study  Patient Details Name: Ronald [name] MRN: [id number] Date of Birth: 08-19-1922 Today's Date: 03/11/2017 Time: SLP Start Time (ACUTE ONLY): 1030 -SLP Stop Time (ACUTE ONLY): 1055 SLP Time Calculation  (min) (ACUTE ONLY): 25 min Past Medical History: Past Medical History: Diagnosis Date . AS (aortic stenosis)   bovine aortic valve replacement 01/07/11 . Bladder cancer [facility])   Bladder Cancer local . Blood transfusion   w/hip operation . Cellulitis of left lower extremity  . Chronic atrial fibrillation ([place])  . Chronic diastolic congestive heart failure ([place])  . Claudication in peripheral vascular disease ([place]) 06/17/2011 . COPD (chronic obstructive pulmonary disease) ([place])  . Depression wife died 4 years ago.   Marland Kitchen History of stomach ulcers ~ 1951 . HTN (hypertension)  . Neuropathy due to secondary diabetes ([place])  . Peripheral vascular disease ([place]) very poor circulation legs and feet ... stents right and left legs... done in dr j. Gwenlyn Found 's office.  . Pneumonia 07/25/11  left . Recurrent upper respiratory infection (URI)   sinusitis . Renal artery stenosis ([place]) 2006  renal artery stent . S/P angioplasty with stent, diamond back rotational athrectomy Prox. Rt. SFA 06/17/2011 06/17/2011 . Shortness of breath  . Thrombocytopenia due to drugs   seen by Dr Inda Merlin plts [id number] no rx . Type II diabetes mellitus ([place])  Past Surgical History: Past Surgical History: Procedure Laterality Date . ABDOMINAL ANGIOGRAM  06/17/2011  Procedure: ABDOMINAL ANGIOGRAM;  Surgeon: Lorretta Harp, MD;  Location: [facility] CATH LAB;  Service: Cardiovascular;; . AORTIC VALVE REPLACEMENT  01/07/2011  Procedure: AORTIC VALVE REPLACEMENT (AVR);  Surgeon: Grace Isaac, MD;  Location: [facility];  Service: Open Heart Surgery;  Laterality: N/A;; magna-ease bovine 88mm bioprosthesis . ATHERECTOMY N/A 06/17/2011  Procedure: ATHERECTOMY;  Surgeon: Lorretta Harp, MD;  Location: [facility] CATH LAB;  Service: Cardiovascular;  Laterality: N/A; . CARDIAC CATHETERIZATION  11/19/10  normal coronaries, mod AS, 75% l RAS . CARDIOVERSION  04/03/2011  Procedure: CARDIOVERSION;  Surgeon: Leonie Man, MD;  Location: [place];  Service: Cardiovascular;  Laterality: N/A; . CATARACT  EXTRACTION W/ INTRAOCULAR LENS  IMPLANT, BILATERAL  ~ 2007 . CHOLECYSTECTOMY   . FEMUR IM NAIL  06/27/2011  Procedure: INTRAMEDULLARY (IM) NAIL FEMORAL;  Surgeon: Marin Shutter, MD;  Location: [facility];  Service: Orthopedics;  Laterality: Right; . FEMUR IM NAIL Left 12/14/2013  Procedure: INTRAMEDULLARY (IM) NAIL FEMORAL;  Surgeon: Mauri Pole, MD;  Location: [facility];  Service: Orthopedics;  Laterality: Left; . FRACTURE SURGERY   . LOWER EXTREMITY ANGIOGRAM  06/17/2011  diamondback orbital rotational and cutting balloon atherectomy of the prox R SFA . LOWER EXTREMITY ANGIOGRAM Bilateral 06/17/2011  Procedure: LOWER EXTREMITY ANGIOGRAM;  Surgeon: Lorretta Harp, MD;  Location[facility];  Service: Cardiovascular;  Laterality: Bilateral; . PERIPHERAL ARTERIAL STENT GRAFT    2006 left anf right illiac stents Dr Deon Pilling . RENAL ARTERY STENT  2006  "I believe" . REVERSE SHOULDER ARTHROPLASTY Left 12/31/2016  Procedure: LEFT REVERSE SHOULDER ARTHROPLASTY;  Surgeon: Nicholes Stairs, MD;  Location: [place];  Service: Orthopedics;  Laterality: Left; . TONSILLECTOMY AND ADENOIDECTOMY    "when I was a kid" HPI: MAVIN DYKE is a 84 y.o. male with medical history significant AF status post  bovine aVR, chronic diastolic heart failure, CAD, COPD, depression, hypertension, diabetes, thrombocytopenia recent hip fracture presents to the emergency department from facility with the chief complaint of shortness of breath. Initial evaluation concerning for acute on chronic diastolic heart failure in the setting of COPD. RN noticed coughing with water and pills  No Data Recorded Assessment / Plan / Recommendation CHL IP CLINICAL IMPRESSIONS 03/11/2017 Clinical Impression Pt presents with a moderate oropharyngeal dysphagia with late laryngeal clousre leading to sensed penetration to the cords during the swallow. Pt clears throat or coughs softly, but not forcefully enough to fully eject penetrate. A cue to fully clearly with friction  and swallow again is consistently successfully. Pt is hard of hearing but cognitively in tact to learn this strategy. A chin tuck was not successful and mild pharyngeal weakness results in enough vallecular residual with nectar to pose equal risk of aspiration post swallow. Recommend pt resume thin liquids and regular solids with SLP interventions in compensatory throat clearing and effortful swallowing. Recommend f/u SLP at next level of care.  SLP Visit Diagnosis Dysphagia, oropharyngeal phase (R13.12) Attention and concentration deficit following -- Frontal lobe and executive function deficit following -- Impact on safety and function Moderate aspiration risk   CHL IP TREATMENT RECOMMENDATION 03/11/2017 Treatment Recommendations Therapy as outlined in treatment plan below   Prognosis 03/11/2017 Prognosis for Safe Diet Advancement Good Barriers to Reach Goals -- Barriers/Prognosis Comment -- CHL IP DIET RECOMMENDATION 03/11/2017 SLP Diet Recommendations Regular solids;Thin liquid Liquid Administration via Cup;Straw Medication Administration Whole meds with puree Compensations Slow rate;Small sips/bites;Clear throat after each swallow;Multiple dry swallows after each bite/sip Postural Changes Seated upright at 90 degrees;Remain semi-upright after after feeds/meals (Comment)   CHL IP OTHER RECOMMENDATIONS 03/11/2017 Recommended Consults -- Oral Care Recommendations Oral care BID Other Recommendations --   CHL IP FOLLOW UP RECOMMENDATIONS 03/11/2017 Follow up Recommendations SNF SLP   CHL IP FREQUENCY AND DURATION 03/11/2017 Speech Therapy Frequency (ACUTE ONLY) min 2x/week Treatment Duration 2 weeks      CHL IP ORAL PHASE 03/11/2017 Oral Phase Impaired Oral - Pudding Teaspoon -- Oral - Pudding Cup -- Oral - Honey Teaspoon -- Oral - Honey Cup -- Oral - Nectar Teaspoon -- Oral - Nectar Cup Lingual/palatal residue Oral - Nectar Straw Lingual/palatal residue Oral - Thin Teaspoon -- Oral - Thin Cup Lingual/palatal residue Oral  - Thin Straw -- Oral - Puree Lingual/palatal residue Oral - Mech Soft Lingual/palatal residue Oral - Regular -- Oral - Multi-Consistency -- Oral - Pill WFL Oral Phase - Comment --  CHL IP PHARYNGEAL PHASE 03/11/2017 Pharyngeal Phase Impaired Pharyngeal- Pudding Teaspoon -- Pharyngeal -- Pharyngeal- Pudding Cup -- Pharyngeal -- Pharyngeal- Honey Teaspoon -- Pharyngeal -- Pharyngeal- Honey Cup -- Pharyngeal -- Pharyngeal- Nectar Teaspoon -- Pharyngeal -- Pharyngeal- Nectar Cup Delayed swallow initiation-pyriform sinuses;Reduced airway/laryngeal closure;Reduced tongue base retraction;Penetration/Aspiration during swallow;Pharyngeal residue - valleculae;Penetration/Apiration after swallow Pharyngeal Material enters airway, remains ABOVE vocal cords and not ejected out;Material does not enter airway Pharyngeal- Nectar Straw Delayed swallow initiation-pyriform sinuses;Reduced airway/laryngeal closure;Reduced tongue base retraction;Penetration/Aspiration during swallow;Pharyngeal residue - valleculae;Penetration/Apiration after swallow Pharyngeal Material enters airway, remains ABOVE vocal cords and not ejected out;Material does not enter airway Pharyngeal- Thin Teaspoon -- Pharyngeal -- Pharyngeal- Thin Cup Delayed swallow initiation-pyriform sinuses;Reduced airway/laryngeal closure;Reduced tongue base retraction;Penetration/Aspiration during swallow;Pharyngeal residue - valleculae;Penetration/Aspiration before swallow;Compensatory strategies attempted (with notebox) Pharyngeal Material enters airway, CONTACTS cords and then ejected out;Material enters airway, remains ABOVE vocal cords and not ejected out;Material does not enter airway;Material enters  airway, remains ABOVE vocal cords then ejected out Pharyngeal- Thin Straw Delayed swallow initiation-pyriform sinuses;Reduced airway/laryngeal closure;Reduced tongue base retraction;Penetration/Aspiration during swallow;Pharyngeal residue - valleculae;Penetration/Apiration  after swallow;Penetration/Aspiration before swallow Pharyngeal Material does not enter airway;Material enters airway, remains ABOVE vocal cords then ejected out;Material enters airway, remains ABOVE vocal cords and not ejected out;Material enters airway, CONTACTS cords and then ejected out Pharyngeal- Puree Delayed swallow initiation-vallecula;Reduced tongue base retraction;Pharyngeal residue - valleculae Pharyngeal -- Pharyngeal- Mechanical Soft Delayed swallow initiation-vallecula;Pharyngeal residue - valleculae;Reduced tongue base retraction Pharyngeal -- Pharyngeal- Regular -- Pharyngeal -- Pharyngeal- Multi-consistency -- Pharyngeal -- Pharyngeal- Pill -- Pharyngeal -- Pharyngeal Comment --  No flowsheet data found. No flowsheet data found. DeBlois, Katherene Ponto 03/11/2017, 11:16 AM                  Subjective: Patient feeling better, no dyspnea or chest pain. Out of bed as tolerated. No nausea or vomiting.   Discharge Exam: Vitals:   03/13/17 0427 03/13/17 1106  BP: (!) 150/51   Pulse: 74   Resp: 20   Temp: 98.6 F (37 C)   SpO2: 97% 92%   Vitals:   03/12/17 1708 03/12/17 2039 03/13/17 0427 03/13/17 1106  BP: (!) 153/55 (!) 140/48 (!) 150/51   Pulse:  68 74   Resp:  20 20   Temp:  98.6 F (37 C) 98.6 F (37 C)   TempSrc:  Oral Oral   SpO2:  96% 97% 92%  Weight:   77.6 kg (171 lb)   Height:        General: Pt is alert, awake, not in acute distress E ENT. No pallor or icterus Cardiovascular: RRR, S1/S2 +, no rubs, no gallops Respiratory: CTA bilaterally, no wheezing, no rhonchi Abdominal: Soft, NT, ND, bowel sounds + Extremities: no edema, no cyanosis    The results of significant diagnostics from this hospitalization (including imaging, microbiology, ancillary and laboratory) are listed below for reference.     Microbiology: Recent Results (from the past 240 hour(s))  MRSA PCR Screening     Status: None   Collection Time: 03/11/17 11:02 AM  Result Value Ref  Range Status   MRSA by PCR NEGATIVE NEGATIVE Final    Comment:        The GeneXpert MRSA Assay (FDA approved for NASAL specimens only), is one component of a comprehensive MRSA colonization surveillance program. It is not intended to diagnose MRSA infection nor to guide or monitor treatment for MRSA infections.      Labs: BNP (last 3 results) Recent Labs    03/10/17 0853  BNP 347.4*   Basic Metabolic Panel: Recent Labs  Lab 03/10/17 0853 03/11/17 0554 03/12/17 0713 03/13/17 0549  NA 140 142 142 141  K 4.1 3.1* 4.0 3.6  CL 108 104 106 104  CO2 24 29 28 28   GLUCOSE 132* 62* 82 93  BUN 17 16 19  21*  CREATININE 1.35* 1.41* 1.43* 1.54*  CALCIUM 8.7* 8.5* 8.5* 8.6*   Liver Function Tests: Recent Labs  Lab 03/10/17 0853  AST 18  ALT 16*  ALKPHOS 79  BILITOT 1.0  PROT 6.6  ALBUMIN 2.8*   No results for input(s): LIPASE, AMYLASE in the last 168 hours. No results for input(s): AMMONIA in the last 168 hours. CBC: Recent Labs  Lab 03/10/17 0853  WBC 8.9  NEUTROABS 7.1  HGB 8.3*  HCT 26.5*  MCV 96.4  PLT 142*   Cardiac Enzymes: No results for input(s): CKTOTAL, CKMB, CKMBINDEX, TROPONINI in  the last 168 hours. BNP: Invalid input(s): POCBNP CBG: Recent Labs  Lab 03/12/17 0738 03/12/17 1133 03/12/17 1611 03/12/17 2127 03/13/17 0802  GLUCAP 84 167* 186* 149* 124*   D-Dimer No results for input(s): DDIMER in the last 72 hours. Hgb A1c Recent Labs    03/10/17 1200  HGBA1C 5.0   Lipid Profile No results for input(s): CHOL, HDL, LDLCALC, TRIG, CHOLHDL, LDLDIRECT in the last 72 hours. Thyroid function studies No results for input(s): TSH, T4TOTAL, T3FREE, THYROIDAB in the last 72 hours.  Invalid input(s): FREET3 Anemia work up No results for input(s): VITAMINB12, FOLATE, FERRITIN, TIBC, IRON, RETICCTPCT in the last 72 hours. Urinalysis    Component Value Date/Time   COLORURINE YELLOW 12/13/2013 1556   APPEARANCEUR CLOUDY (A) 12/13/2013 1556    LABSPEC 1.012 12/13/2013 1556   PHURINE 6.0 12/13/2013 1556   GLUCOSEU NEGATIVE 12/13/2013 1556   HGBUR NEGATIVE 12/13/2013 1556   BILIRUBINUR NEGATIVE 12/13/2013 1556   KETONESUR NEGATIVE 12/13/2013 1556   PROTEINUR NEGATIVE 12/13/2013 1556   UROBILINOGEN 1.0 12/13/2013 1556   NITRITE NEGATIVE 12/13/2013 1556   LEUKOCYTESUR NEGATIVE 12/13/2013 1556   Sepsis Labs Invalid input(s): PROCALCITONIN,  WBC,  LACTICIDVEN Microbiology Recent Results (from the past 240 hour(s))  MRSA PCR Screening     Status: None   Collection Time: 03/11/17 11:02 AM  Result Value Ref Range Status   MRSA by PCR NEGATIVE NEGATIVE Final    Comment:        The GeneXpert MRSA Assay (FDA approved for NASAL specimens only), is one component of a comprehensive MRSA colonization surveillance program. It is not intended to diagnose MRSA infection nor to guide or monitor treatment for MRSA infections.      Time coordinating discharge: 45 minutes  SIGNED:   Tawni Millers, MD  [facility] 03/13/2017, 11:44 AM Pager [phone]  If 7PM-7AM, please contact night-coverage [email or web address] Password [email or web address]

## 2017-03-13 NOTE — NC FL2 (Signed)
[place] MEDICAID FL2 LEVEL OF CARE SCREENING TOOL     IDENTIFICATION  Patient Name: Ronald [name] Birthdate: May 12, 1922 Sex: male Admission Date (Current Location): 03/10/2017  [facility] and [place] Number:  Herbalist and Address:  [facility], [place] [address], [place],  [address]      Provider Number: [id number]  Attending Physician Name and Address:  Tawni Millers  Relative Name and Phone Number:       Current Level of Care: Hospital Recommended Level of Care: Assisted Living Facility(with PT/OT) Prior Approval Number:    Date Approved/Denied:   PASRR Number:    Discharge Plan: Other (Comment)(ALF with PT/OT)    Current Diagnoses: Patient Active Problem List   Diagnosis Date Noted  . Acute respiratory distress 03/10/2017  . Skin excoriation 03/10/2017  . Closed fracture of left proximal humerus 12/26/2016  . Comminuted left humeral fracture 12/23/2016  . GI bleed 07/02/2016  . AKI (acute kidney injury) ([place]) 07/02/2016  . Lactic acidemia 07/02/2016  . Lower GI bleed   . Tobacco use 03/07/2015  . Diabetes mellitus type 2, uncontrolled ([place]) 12/20/2013  . Acute blood loss anemia 12/20/2013  . Allergic rhinitis 12/20/2013  . Intertrochanteric fracture of left femur ([place]) 12/12/2013  . Anemia 12/12/2013  . Right acetabular fracture ([place]) 04/11/2013  . Acetabular fracture ([place]) 04/11/2013  . Chronic renal insufficiency, stage IV (severe) ([employer]) 12/24/2012  . Bilateral claudication of lower limb ([place]) 12/24/2012  . Cellulitis 12/02/2012  . History of tobacco abuse- 75 years, quit in Feb 2014 07/06/2012  . Acute on chronic diastolic (congestive) heart failure ([place]) 07/06/2012  . COPD (chronic obstructive pulmonary disease) ([place]) 05/27/2012  . Pulmonary nodules 05/27/2012  . Normal coronary arteries, cath 11/12 07/25/2011  . Normal left ventricular systolic function, Echo 6/44 07/25/2011  . Closed right hip  fracture, ORIF 0/3/47 complicated by gluteal hematoma while being Coumadinized post op 06/27/2011  . Aspirin allergy, rash, SOB. Pt is on Plavix 06/19/2011  . Chronic atrial fibrillation ([place]) 06/19/2011  . Thrombocytopenia ([place]) 01/11/2011  . Leukocytosis 01/11/2011  . S/P aortic valve replacement: #23 Magna Ease Edwards Pericardial Valve  November 2012 01/08/2011  . AS (aortic stenosis)- s/p tissue AVR 01/07/11   . CAROTID BRUIT- moderate ICA disease 5/14 05/31/2008  . Diabetes mellitus with complication ([place]) 42/59/5638  . Hyperlipidemia 07/11/2007  . DEPRESSION 07/11/2007  . RESTLESS LEGS SYNDROME 07/11/2007  . Essential hypertension 07/11/2007  . PVD, Rt SFA PTA/HSRA 06/17/11 07/11/2007  . DIVERTICULOSIS, COLON 07/11/2007  . RENAL CYST 07/11/2007  . ARTHRITIS 07/11/2007  . SKIN CANCER, HX OF 07/11/2007  . RHEUMATIC HEART DISEASE, HX OF 07/11/2007    Orientation RESPIRATION BLADDER Height & Weight     Self, Time, Place  Normal Incontinent Weight: 171 lb (77.6 kg) Height:  5\' 11"  (180.3 cm)  BEHAVIORAL SYMPTOMS/MOOD NEUROLOGICAL BOWEL NUTRITION STATUS  (None) (None) Continent Diet(CCHO regular)  AMBULATORY STATUS COMMUNICATION OF NEEDS Skin   Limited Assist Verbally Other (Comment)(MASD, Skin tear.)                       Personal Care Assistance Level of Assistance  Bathing, Feeding, Dressing Bathing Assistance: Limited assistance Feeding assistance: Limited assistance Dressing Assistance: Limited assistance     Functional Limitations Info  Sight, Hearing, Speech Sight Info: Adequate Hearing Info: Adequate Speech Info: Adequate    SPECIAL CARE FACTORS FREQUENCY  PT (By licensed PT), OT (By licensed OT), Blood  pressure     PT Frequency: 3 x week OT Frequency: 3 x week            Contractures Contractures Info: Not present    Additional Factors Info  Code Status, Allergies, Psychotropic Code Status Info: Full Allergies Info: Aspirin Psychotropic  Info: Depression: Wellbutrin XL 150 PO "BH-each morning."          Current Medications (03/13/2017):  This is the current hospital active medication list Current Facility-Administered Medications  Medication Dose Route Frequency Provider Last Rate Last Dose  . 0.9 %  sodium chloride infusion  250 mL Intravenous PRN Radene Gunning, NP      . acetaminophen (TYLENOL) tablet 650 mg  650 mg Oral Q4H PRN Radene Gunning, NP      . buPROPion (WELLBUTRIN XL) 24 hr tablet 150 mg  150 mg Oral BH-q7a Black, Karen M, NP   150 mg at 03/13/17 8502  . doxazosin (CARDURA) tablet 4 mg  4 mg Oral QPM Radene Gunning, NP   4 mg at 03/12/17 1708  . ferrous sulfate tablet 325 mg  325 mg Oral BID Radene Gunning, NP   325 mg at 03/13/17 0827  . finasteride (PROSCAR) tablet 5 mg  5 mg Oral QHS Radene Gunning, NP   5 mg at 03/12/17 2214  . fluticasone (FLONASE) 50 MCG/ACT nasal spray 1 spray  1 spray Each Nare BID Radene Gunning, NP   1 spray at 03/13/17 0827  . furosemide (LASIX) injection 40 mg  40 mg Intravenous Daily Tawni Millers, MD   40 mg at 03/13/17 0826  . HYDROcodone-acetaminophen (NORCO/VICODIN) 5-325 MG per tablet 1-2 tablet  1-2 tablet Oral Q4H PRN Radene Gunning, NP   1 tablet at 03/10/17 1216  . insulin aspart (novoLOG) injection 0-5 Units  0-5 Units Subcutaneous QHS Radene Gunning, NP   2 Units at 03/11/17 2233  . insulin aspart (novoLOG) injection 0-9 Units  0-9 Units Subcutaneous TID WC Radene Gunning, NP   1 Units at 03/13/17 0827  . insulin glargine (LANTUS) injection 5 Units  5 Units Subcutaneous Daily Arrien, Jimmy Picket, MD   5 Units at 03/12/17 2046  . loratadine (CLARITIN) tablet 10 mg  10 mg Oral Daily Radene Gunning, NP   10 mg at 03/13/17 0827  . metoprolol succinate (TOPROL-XL) 24 hr tablet 50 mg  50 mg Oral BID WC Radene Gunning, NP   50 mg at 03/13/17 0826  . ondansetron (ZOFRAN) injection 4 mg  4 mg Intravenous Q6H PRN Black, Karen M, NP      . polyethylene glycol (MIRALAX /  GLYCOLAX) packet 17 g  17 g Oral Daily Radene Gunning, NP   17 g at 03/12/17 7741  . [place]   Oral PRN Karmen Bongo, MD      . simvastatin (ZOCOR) tablet 20 mg  20 mg Oral QPM Radene Gunning, NP   20 mg at 03/12/17 1708  . sodium chloride flush (NS) 0.9 % injection 3 mL  3 mL Intravenous Q12H Radene Gunning, NP   3 mL at 03/13/17 0828  . sodium chloride flush (NS) 0.9 % injection 3 mL  3 mL Intravenous PRN Black, Lezlie Octave, NP      . traMADol Veatrice Bourbon) tablet 50 mg  50 mg Oral Daily PRN Radene Gunning, NP   50 mg at 03/11/17 2245  . umeclidinium-vilanterol (ANORO ELLIPTA) 62.5-25 MCG/INH 1  puff  1 puff Inhalation Daily Radene Gunning, NP   1 puff at 03/13/17 1106  . warfarin (COUMADIN) tablet 2 mg  2 mg Oral ONCE-1800 Romona Curls, [place]      . Warfarin - Pharmacist Dosing Inpatient   Does not apply q1800 Priscella Mann [place] at 03/10/17 1800     Discharge Medications: TAKE these medications   acetaminophen 325 MG tablet Commonly known as:  TYLENOL Take 650 mg by mouth every 6 (six) hours as needed for mild pain.   albuterol 0.63 MG/3ML nebulizer solution Commonly known as:  ACCUNEB Take 3 mLs by nebulization every 6 (six) hours as needed for wheezing or shortness of breath.   alendronate 70 MG tablet Commonly known as:  FOSAMAX Take 70 mg by mouth every 14 (fourteen) days. Take every other sunday   ANORO ELLIPTA 62.5-25 MCG/INH Aepb Generic drug:  umeclidinium-vilanterol Inhale 1 puff into the lungs daily.   benazepril 20 MG tablet Commonly known as:  LOTENSIN Take 20 mg by mouth daily.   buPROPion 150 MG 24 hr tablet Commonly known as:  WELLBUTRIN XL Take 150 mg by mouth daily.   doxazosin 4 MG tablet Commonly known as:  CARDURA Take 4 mg by mouth daily.   ferrous sulfate 325 (65 FE) MG EC tablet Take 1 tablet (325 mg total) by mouth 2 (two) times daily.   finasteride 5 MG tablet Commonly known as:  PROSCAR Take 5 mg by mouth at  bedtime.   fluticasone 50 MCG/ACT nasal spray Commonly known as:  FLONASE Place 1 spray into both nostrils 2 (two) times daily.   furosemide 40 MG tablet Commonly known as:  LASIX Take 1 tablet (40 mg total) by mouth daily.   glyBURIDE 5 MG tablet Commonly known as:  DIABETA Take 2.5-10 mg by mouth See admin instructions. 10mg  by mouth in the morning. 2.5mg  in the evening   Insulin Glargine 100 UNIT/ML Solostar Pen Commonly known as:  LANTUS Inject 10 Units into the skin daily.   loratadine 10 MG tablet Commonly known as:  CLARITIN Take 10 mg by mouth daily.   Melatonin 5 MG Tabs Take 5 mg by mouth at bedtime.   metoprolol succinate 50 MG 24 hr tablet Commonly known as:  TOPROL-XL Take 50 mg by mouth 2 (two) times daily. Take with or immediately following a meal.   polyethylene glycol packet Commonly known as:  MIRALAX / GLYCOLAX Take 17 g by mouth daily.   potassium chloride SA 20 MEQ tablet Commonly known as:  K-DUR,KLOR-CON Take 20 mEq by mouth daily.   simvastatin 20 MG tablet Commonly known as:  ZOCOR Take 20 mg by mouth every evening.   traMADol 50 MG tablet Commonly known as:  ULTRAM Take 1 tablet (50 mg total) by mouth every 12 (twelve) hours as needed for moderate pain.   warfarin 2 MG tablet Commonly known as:  COUMADIN Take 1-2 mg by mouth See admin instructions. 1mg  by mouth once daily in the evening on Tuesday and Thursday 2mg  by mouth once daily in the evening on Monday, Wednesday, Friday, Saturday, Sunday      Relevant Imaging Results:  Relevant Lab Results:   Additional Information SS#: [id number]  Candie Chroman, LCSW

## 2017-03-13 NOTE — Progress Notes (Signed)
[facility] for Warfarin Indication: atrial fibrillation  Allergies  Allergen Reactions  . Aspirin Anaphylaxis, Shortness Of Breath and Rash    "broke out in white welts; red blotches neck and face; windpipe closing; ~ 1962"    Patient Measurements: Height: 5\' 11"  (180.3 cm) Weight: 171 lb (77.6 kg) IBW/kg (Calculated) : 75.3  Vital Signs: Temp: 98.6 F (37 C) (01/17 0427) Temp Source: Oral (01/17 0427) BP: 150/51 (01/17 0427) Pulse Rate: 74 (01/17 0427)  Labs: Recent Labs    03/11/17 0554 03/12/17 0713 03/13/17 0549  LABPROT 40.3* 27.3* 21.3*  INR 4.21* 2.56 1.86  CREATININE 1.41* 1.43* 1.54*    Estimated Creatinine Clearance: 32.6 mL/min (A) (by C-G formula based on SCr of 1.54 mg/dL (H)).   Medications:  Warfarin PTA dose 2mg  daily except 1mg  on Tuesdays and Thursdays - last dose 1/13 PTA  Assessment: 84 year old male on warfarin prior to admission on atrial fibrillation. INR elevated at 3.6 on admit.  INR up to 4.21 on 1/15 and warfarin held, now back down to 1.86 (due to held doses). Hg 8.3, plt 142. No bleed documented.  Goal of Therapy:  INR 2-3 Monitor platelets by anticoagulation protocol: Yes   Plan:  Warfarin 2mg  PO x 1 tonight Monitor daily PT/INR, CBC, s/sx bleeding   Elicia Lamp, PharmD, BCPS Clinical Pharmacist Clinical phone for 03/13/2017 until 3:30pm: [phone] If after 3:30pm, please call main pharmacy at: [phone] 03/13/2017 10:34 AM

## 2017-03-13 NOTE — Clinical Social Work Note (Signed)
CSW facilitated patient discharge including contacting patient family and facility to confirm patient discharge plans. Clinical information faxed to facility and family agreeable with plan. Patient's daughter will transport him back to [facility] ALF by car. RN to call report prior to discharge [phone]).  CSW will sign off for now as social work intervention is no longer needed. Please consult [place] again if new needs arise.  [name] Scrape, [place]

## 2017-03-13 NOTE — Clinical Social Work Note (Signed)
According to [place] covering the unit yesterday afternoon, patient will return to ALF once stable for discharge.  [name] Scrape, [place]

## 2017-03-17 DIAGNOSIS — R269 Unspecified abnormalities of gait and mobility: Secondary | ICD-10-CM | POA: Diagnosis not present

## 2017-03-17 DIAGNOSIS — M810 Age-related osteoporosis without current pathological fracture: Secondary | ICD-10-CM | POA: Diagnosis not present

## 2017-03-17 DIAGNOSIS — I4891 Unspecified atrial fibrillation: Secondary | ICD-10-CM | POA: Diagnosis not present

## 2017-03-17 DIAGNOSIS — J449 Chronic obstructive pulmonary disease, unspecified: Secondary | ICD-10-CM | POA: Diagnosis not present

## 2017-03-17 DIAGNOSIS — S42122D Displaced fracture of acromial process, left shoulder, subsequent encounter for fracture with routine healing: Secondary | ICD-10-CM | POA: Diagnosis not present

## 2017-03-17 DIAGNOSIS — S52591A Other fractures of lower end of right radius, initial encounter for closed fracture: Secondary | ICD-10-CM | POA: Diagnosis not present

## 2017-03-17 DIAGNOSIS — I5032 Chronic diastolic (congestive) heart failure: Secondary | ICD-10-CM | POA: Diagnosis not present

## 2017-03-17 DIAGNOSIS — M84359S Stress fracture, hip, unspecified, sequela: Secondary | ICD-10-CM | POA: Diagnosis not present

## 2017-03-17 DIAGNOSIS — R296 Repeated falls: Secondary | ICD-10-CM | POA: Diagnosis not present

## 2017-03-17 DIAGNOSIS — R0602 Shortness of breath: Secondary | ICD-10-CM | POA: Diagnosis not present

## 2017-03-18 DIAGNOSIS — H903 Sensorineural hearing loss, bilateral: Secondary | ICD-10-CM | POA: Diagnosis not present

## 2017-03-18 DIAGNOSIS — Z7901 Long term (current) use of anticoagulants: Secondary | ICD-10-CM | POA: Diagnosis not present

## 2017-03-18 DIAGNOSIS — H6123 Impacted cerumen, bilateral: Secondary | ICD-10-CM | POA: Diagnosis not present

## 2017-03-19 ENCOUNTER — Other Ambulatory Visit: Payer: Self-pay | Admitting: Pharmacist

## 2017-03-19 NOTE — Patient Outreach (Signed)
[place] [facility]) Care Management  03/19/2017  Ronald [name] 1922-12-05 [id number]   Outreach to [facility] where patient resides. Note, per Epic, patient was hospitalized from 03/10/17-03/13/17 for acute respiratory distress. Call facility to review current medications in order to perform post-discharge medication review for patient. Leave a message on the nurse voicemail. If have not heard back by 03/21/17, will call to follow up again at that time.  Harlow Asa, PharmD, [place] Management [phone]

## 2017-03-21 ENCOUNTER — Other Ambulatory Visit: Payer: Self-pay | Admitting: Pharmacist

## 2017-03-21 DIAGNOSIS — Z7901 Long term (current) use of anticoagulants: Secondary | ICD-10-CM | POA: Diagnosis not present

## 2017-03-21 NOTE — Patient Outreach (Signed)
[place] [facility]) Care Management  03/21/2017  Ronald [name] Jul 25, 1922 [id number]   Outreach to [facility] where patient resides. Note, per Epic, patient was hospitalized from 03/10/17-03/13/17 for acute respiratory distress. Call facility to review current medications in order to perform post-discharge medication review for patient. Outreach attempt #2. Leave a message on the nurse voicemail. If have not heard back by 03/24/17, will call to follow up again at that time.  Harlow Asa, PharmD, [place] [facility] [phone]

## 2017-03-24 ENCOUNTER — Other Ambulatory Visit: Payer: Self-pay | Admitting: Pharmacist

## 2017-03-24 NOTE — Patient Outreach (Signed)
[place] [facility]) Care Management  03/24/2017  RAFIEL MECCA [birth year] [id number]   Outreach to [facility] where patient resides. Note, per Epic, patient was hospitalized from 03/10/17-03/13/17 for acute respiratory distress. Call facility to review current medications in order to perform post-discharge medication review for patient. Outreach attempt #3. Leave a message on the nurse voicemail. If have not heard back by later today, will call to follow up again at that time.  Harlow Asa, PharmD, [place] [facility] [phone]

## 2017-03-26 ENCOUNTER — Other Ambulatory Visit: Payer: Self-pay | Admitting: Pharmacist

## 2017-03-26 NOTE — Patient Outreach (Signed)
[facility]) Care Management  03/26/17 KENNAN DETTER 08/23/1922   Ronald [name] is a 84 year old male active in [place] registry noted to have been recently hospitalized 03/10/17 to 03/13/17 for acute respiratory distress.  Per Epic, PMHx includes, but not limited to, atrial fibrillation,status post tissue aortic valve replacement, chronic diastolic heart failure,hypertension, COPD, ongoing tobacco abuse, depression,andrecent hip fracture.[facility] pharmacy referred for 30 day post discharge medication reconciliation.   Outreach to [facility] where patient resides. Perform medication review with Karena Addison, nurse with [facility].  ASSESSMENT: Date Discharged from Hospital: 03/13/17 Date Medication Reconciliation Performed: 03/26/17  Patient was recently discharged from hospital and all medications have been reviewed.  Medications Discontinued at Discharge:  benazepril (LOTENSIN) 10 MG tablet Take 10 mg by mouth daily.    [provider]   HYDROcodone-acetaminophen (NORCO/VICODIN) 5-325 MG tablet Take 1-2 tablets every 4 (four) hours as needed by mouth (breakthrough pain). 01/03/17   Nicholes Stairs, MD   senna-docusate (SENOKOT-S) 8.6-50 MG tablet Take 1 tablet by mouth at bedtime. 12/25/16     New Medications at Discharge:  acetaminophen 325 MG tablet Commonly known as:  TYLENOL Take 650 mg by mouth every 6 (six) hours as needed for mild pain.   albuterol 0.63 MG/3ML nebulizer solution Commonly known as:  ACCUNEB Take 3 mLs by nebulization every 6 (six) hours as needed for wheezing or shortness of breath.   benazepril 20 MG tablet Commonly known as:  LOTENSIN Take 20 mg by mouth daily.   Continued Medications at Discharge:  alendronate 70 MG tablet Commonly known as:  FOSAMAX Take 70 mg by mouth every 14 (fourteen) days. Take every other sunday   ANORO ELLIPTA 62.5-25 MCG/INH Aepb Generic drug:   umeclidinium-vilanterol Inhale 1 puff into the lungs daily.   buPROPion 150 MG 24 hr tablet Commonly known as:  WELLBUTRIN XL Take 150 mg by mouth daily.   doxazosin 4 MG tablet Commonly known as:  CARDURA Take 4 mg by mouth daily.   ferrous sulfate 325 (65 FE) MG EC tablet Take 1 tablet (325 mg total) by mouth 2 (two) times daily.   finasteride 5 MG tablet Commonly known as:  PROSCAR Take 5 mg by mouth at bedtime.   fluticasone 50 MCG/ACT nasal spray Commonly known as:  FLONASE Place 1 spray into both nostrils 2 (two) times daily.   furosemide 40 MG tablet Commonly known as:  LASIX Take 1 tablet (40 mg total) by mouth daily.   glyBURIDE 5 MG tablet Commonly known as:  DIABETA Take 2.5-10 mg by mouth See admin instructions. '10mg'$  by mouth in the morning. 2.'5mg'$  in the evening   Insulin Glargine 100 UNIT/ML Solostar Pen Commonly known as:  LANTUS Inject 10 Units into the skin daily.   loratadine 10 MG tablet Commonly known as:  CLARITIN Take 10 mg by mouth daily.   metoprolol succinate 50 MG 24 hr tablet Commonly known as:  TOPROL-XL Take 50 mg by mouth 2 (two) times daily. Take with or immediately following a meal.   polyethylene glycol packet Commonly known as:  MIRALAX / GLYCOLAX Take 17 g by mouth daily.   potassium chloride SA 20 MEQ tablet Commonly known as:  K-DUR,KLOR-CON Take 20 mEq by mouth daily.   simvastatin 20 MG tablet Commonly known as:  ZOCOR Take 20 mg by mouth every evening.   traMADol 50 MG tablet Commonly known as:  ULTRAM Take  1 tablet (50 mg total) by mouth every 12 (twelve) hours as needed for moderate pain.    warfarin 2 MG tablet Commonly known as:  COUMADIN Take 1-2 mg by mouth See admin instructions. '1mg'$  by mouth once daily in the evening on Tuesday and Thursday '2mg'$  by mouth once daily in the evening on Monday, Wednesday, Friday, Saturday, Sunday     Drugs sorted by system:  Neurologic/Psychologic: bupropion  XL  Cardiovascular: benazepril, furosemide, metoprolol XL, simvastatin, warfarin  Pulmonary/Allergy: albuterol nebulizer solution, fluticasone, loratadine, Anoro  Gastrointestinal: Miralax  Endocrine: glyburide, Lantus  Urinary: doxazosin, finasteride  Pain: acetaminophen, tramadol  Vitamins/Minerals: ferrous sulfate, potassium chloride  Miscellaneous: alendronate, melatonin   Renal dosing: Per Epic chart review, patient's eGFR on 03/13/17 was 37 mL/min. Glyburide is generally not recommended in chronic kidney disease; if sulfonylurea therapy is needed, glipizide is preferred. Will send note to PCP.  Duplications in therapy: possibly doxazosin and finasteride (see below)  Medications to avoid in the elderly:  . Doxazosin- due to risk of orthostatic hypotension. Perform Epic chart review and note that patient has been on both doxazosin and finasteride for >5 years. Note that this combination can be used in patients at initiation of treatment for BPH for about 6 months, to allow time for finasteride's onset of action, or can be used chronically in combination.  Doxazosin is a nonselective alpha-blocker and is associated with a higher risk of orthostatic hypotension and falls compared to selective alpha1-blockers, such as tamsulosin. Will send note to PCP to request provider evaluate patient's need for continued chronic dual finasteride and doxazosin therapy for management of BPH, given increased risk of orthostatic hypotension and falls with alpha1-blockers. Will ask that, if continued chronic dual therapy is appropriate, that provider consider switching patient from doxazosin to a selective alpha1-blocker, such as tamsulosin, to reduce this risk.  Drug interactions:   . Potassium chloride + Anoro/loratadine: Anticholinergic Agents may enhance the ulcerogenic effect of Potassium Chloride. Solid oral dosage forms of potassium chloride are contraindicated in patients with impaired gastric  emptying (e.g., due to the effects of drugs such as many anticholinergics). Patients on drugs with substantial anticholinergic effects should avoid using any solid oral dosage form of potassium chloride. Liquid or effervescent potassium preparations are possible alternatives. Will send a message to patient's PCP to consider if appropriate for patient. . Ferrous sulfate + alendronate: Iron salts may decrease the serum concentration of bisphosphonate derivatives. Avoid administration of oral iron supplements 30 minutes after alendronate. Notified nurse at patient's assisted living facility. . Bupropion + metoprolol: CYP2D6 inhibitors, such as bupropion, may increase the serum concentration of metoprolol. Note that these are both maintenance medications for the patient, not new to him. . Loratadine + tramadol: CNS Depressants may enhance the CNS depressant effect of other CNS depressants. Note that patient takes tramadol only on an as needed basis. Note that patient is residing in an assisted living facility and Karena Addison reports that patient must request tramadol from nursing in order to receive a dose.   Plan:  Medications reviewed with Karena Addison, nurse with [facility]Assisted Living Facility.    I will route my note and send letter to PCP and close patient case.   Harlow Asa, PharmD, [place] Management [phone]

## 2017-03-31 NOTE — Patient Outreach (Signed)
Leave a message with Caryl Pina, nurse with Dr. [name], to confirm the receipt of letter and note that I sent via Epic fax to Dr. [name].  Caryl Pina calls back and leaves message requesting that I use fax number [phone]. Resend fax accordingly.  Ronald [name], PharmD, [place] [facility] [phone]

## 2017-04-04 DIAGNOSIS — Z85828 Personal history of other malignant neoplasm of skin: Secondary | ICD-10-CM | POA: Diagnosis not present

## 2017-04-04 DIAGNOSIS — L570 Actinic keratosis: Secondary | ICD-10-CM | POA: Diagnosis not present

## 2017-04-04 DIAGNOSIS — L905 Scar conditions and fibrosis of skin: Secondary | ICD-10-CM | POA: Diagnosis not present

## 2017-04-08 DIAGNOSIS — R791 Abnormal coagulation profile: Secondary | ICD-10-CM | POA: Diagnosis not present

## 2017-04-10 ENCOUNTER — Inpatient Hospital Stay (HOSPITAL_COMMUNITY)
Admission: EM | Admit: 2017-04-10 | Discharge: 2017-04-26 | DRG: 264 | Disposition: A | Discharge status: Home Health | Payer: PPO | Attending: Internal Medicine | Admitting: Internal Medicine

## 2017-04-10 ENCOUNTER — Encounter (HOSPITAL_COMMUNITY): Payer: Self-pay

## 2017-04-10 ENCOUNTER — Emergency Department (HOSPITAL_COMMUNITY): Payer: PPO

## 2017-04-10 ENCOUNTER — Other Ambulatory Visit: Payer: Self-pay

## 2017-04-10 DIAGNOSIS — N183 Chronic kidney disease, stage 3 (moderate): Secondary | ICD-10-CM

## 2017-04-10 DIAGNOSIS — Z953 Presence of xenogenic heart valve: Secondary | ICD-10-CM

## 2017-04-10 DIAGNOSIS — I480 Paroxysmal atrial fibrillation: Secondary | ICD-10-CM | POA: Diagnosis not present

## 2017-04-10 DIAGNOSIS — N179 Acute kidney failure, unspecified: Secondary | ICD-10-CM | POA: Diagnosis present

## 2017-04-10 DIAGNOSIS — Z961 Presence of intraocular lens: Secondary | ICD-10-CM | POA: Diagnosis not present

## 2017-04-10 DIAGNOSIS — E1122 Type 2 diabetes mellitus with diabetic chronic kidney disease: Secondary | ICD-10-CM | POA: Diagnosis present

## 2017-04-10 DIAGNOSIS — Z9841 Cataract extraction status, right eye: Secondary | ICD-10-CM | POA: Diagnosis not present

## 2017-04-10 DIAGNOSIS — Z09 Encounter for follow-up examination after completed treatment for conditions other than malignant neoplasm: Secondary | ICD-10-CM

## 2017-04-10 DIAGNOSIS — Z8249 Family history of ischemic heart disease and other diseases of the circulatory system: Secondary | ICD-10-CM

## 2017-04-10 DIAGNOSIS — G2581 Restless legs syndrome: Secondary | ICD-10-CM | POA: Diagnosis not present

## 2017-04-10 DIAGNOSIS — I110 Hypertensive heart disease with heart failure: Secondary | ICD-10-CM | POA: Diagnosis not present

## 2017-04-10 DIAGNOSIS — N400 Enlarged prostate without lower urinary tract symptoms: Secondary | ICD-10-CM | POA: Diagnosis present

## 2017-04-10 DIAGNOSIS — R0602 Shortness of breath: Secondary | ICD-10-CM | POA: Diagnosis not present

## 2017-04-10 DIAGNOSIS — L299 Pruritus, unspecified: Secondary | ICD-10-CM | POA: Diagnosis not present

## 2017-04-10 DIAGNOSIS — Z823 Family history of stroke: Secondary | ICD-10-CM

## 2017-04-10 DIAGNOSIS — N184 Chronic kidney disease, stage 4 (severe): Secondary | ICD-10-CM | POA: Diagnosis not present

## 2017-04-10 DIAGNOSIS — Z4901 Encounter for fitting and adjustment of extracorporeal dialysis catheter: Secondary | ICD-10-CM | POA: Diagnosis not present

## 2017-04-10 DIAGNOSIS — E1151 Type 2 diabetes mellitus with diabetic peripheral angiopathy without gangrene: Secondary | ICD-10-CM | POA: Diagnosis not present

## 2017-04-10 DIAGNOSIS — I482 Chronic atrial fibrillation, unspecified: Secondary | ICD-10-CM | POA: Diagnosis present

## 2017-04-10 DIAGNOSIS — Z85828 Personal history of other malignant neoplasm of skin: Secondary | ICD-10-CM

## 2017-04-10 DIAGNOSIS — I5032 Chronic diastolic (congestive) heart failure: Secondary | ICD-10-CM | POA: Diagnosis not present

## 2017-04-10 DIAGNOSIS — D6959 Other secondary thrombocytopenia: Secondary | ICD-10-CM | POA: Diagnosis not present

## 2017-04-10 DIAGNOSIS — D649 Anemia, unspecified: Secondary | ICD-10-CM

## 2017-04-10 DIAGNOSIS — R319 Hematuria, unspecified: Secondary | ICD-10-CM | POA: Diagnosis present

## 2017-04-10 DIAGNOSIS — Z886 Allergy status to analgesic agent status: Secondary | ICD-10-CM

## 2017-04-10 DIAGNOSIS — K922 Gastrointestinal hemorrhage, unspecified: Secondary | ICD-10-CM | POA: Diagnosis present

## 2017-04-10 DIAGNOSIS — F329 Major depressive disorder, single episode, unspecified: Secondary | ICD-10-CM | POA: Diagnosis present

## 2017-04-10 DIAGNOSIS — I509 Heart failure, unspecified: Secondary | ICD-10-CM

## 2017-04-10 DIAGNOSIS — E1140 Type 2 diabetes mellitus with diabetic neuropathy, unspecified: Secondary | ICD-10-CM | POA: Diagnosis present

## 2017-04-10 DIAGNOSIS — J449 Chronic obstructive pulmonary disease, unspecified: Secondary | ICD-10-CM | POA: Diagnosis not present

## 2017-04-10 DIAGNOSIS — Z7189 Other specified counseling: Secondary | ICD-10-CM | POA: Diagnosis not present

## 2017-04-10 DIAGNOSIS — E118 Type 2 diabetes mellitus with unspecified complications: Secondary | ICD-10-CM | POA: Diagnosis not present

## 2017-04-10 DIAGNOSIS — I10 Essential (primary) hypertension: Secondary | ICD-10-CM | POA: Diagnosis present

## 2017-04-10 DIAGNOSIS — E785 Hyperlipidemia, unspecified: Secondary | ICD-10-CM | POA: Diagnosis not present

## 2017-04-10 DIAGNOSIS — K921 Melena: Secondary | ICD-10-CM | POA: Diagnosis not present

## 2017-04-10 DIAGNOSIS — M810 Age-related osteoporosis without current pathological fracture: Secondary | ICD-10-CM | POA: Diagnosis present

## 2017-04-10 DIAGNOSIS — R0902 Hypoxemia: Secondary | ICD-10-CM

## 2017-04-10 DIAGNOSIS — I4891 Unspecified atrial fibrillation: Secondary | ICD-10-CM | POA: Diagnosis not present

## 2017-04-10 DIAGNOSIS — Z794 Long term (current) use of insulin: Secondary | ICD-10-CM

## 2017-04-10 DIAGNOSIS — Z992 Dependence on renal dialysis: Secondary | ICD-10-CM

## 2017-04-10 DIAGNOSIS — D696 Thrombocytopenia, unspecified: Secondary | ICD-10-CM | POA: Diagnosis not present

## 2017-04-10 DIAGNOSIS — I5040 Unspecified combined systolic (congestive) and diastolic (congestive) heart failure: Secondary | ICD-10-CM | POA: Diagnosis not present

## 2017-04-10 DIAGNOSIS — T45515A Adverse effect of anticoagulants, initial encounter: Secondary | ICD-10-CM | POA: Diagnosis present

## 2017-04-10 DIAGNOSIS — Z515 Encounter for palliative care: Secondary | ICD-10-CM

## 2017-04-10 DIAGNOSIS — I132 Hypertensive heart and chronic kidney disease with heart failure and with stage 5 chronic kidney disease, or end stage renal disease: Principal | ICD-10-CM | POA: Diagnosis present

## 2017-04-10 DIAGNOSIS — N19 Unspecified kidney failure: Secondary | ICD-10-CM | POA: Diagnosis not present

## 2017-04-10 DIAGNOSIS — I5043 Acute on chronic combined systolic (congestive) and diastolic (congestive) heart failure: Secondary | ICD-10-CM | POA: Diagnosis not present

## 2017-04-10 DIAGNOSIS — E8809 Other disorders of plasma-protein metabolism, not elsewhere classified: Secondary | ICD-10-CM | POA: Diagnosis present

## 2017-04-10 DIAGNOSIS — Z9842 Cataract extraction status, left eye: Secondary | ICD-10-CM

## 2017-04-10 DIAGNOSIS — Z7901 Long term (current) use of anticoagulants: Secondary | ICD-10-CM

## 2017-04-10 DIAGNOSIS — R791 Abnormal coagulation profile: Secondary | ICD-10-CM

## 2017-04-10 DIAGNOSIS — Z7951 Long term (current) use of inhaled steroids: Secondary | ICD-10-CM

## 2017-04-10 DIAGNOSIS — I5033 Acute on chronic diastolic (congestive) heart failure: Secondary | ICD-10-CM | POA: Diagnosis not present

## 2017-04-10 DIAGNOSIS — K254 Chronic or unspecified gastric ulcer with hemorrhage: Secondary | ICD-10-CM | POA: Diagnosis not present

## 2017-04-10 DIAGNOSIS — R296 Repeated falls: Secondary | ICD-10-CM | POA: Diagnosis not present

## 2017-04-10 DIAGNOSIS — N189 Chronic kidney disease, unspecified: Secondary | ICD-10-CM | POA: Diagnosis not present

## 2017-04-10 DIAGNOSIS — Z8551 Personal history of malignant neoplasm of bladder: Secondary | ICD-10-CM | POA: Diagnosis not present

## 2017-04-10 DIAGNOSIS — J811 Chronic pulmonary edema: Secondary | ICD-10-CM | POA: Diagnosis not present

## 2017-04-10 DIAGNOSIS — I350 Nonrheumatic aortic (valve) stenosis: Secondary | ICD-10-CM

## 2017-04-10 DIAGNOSIS — N186 End stage renal disease: Secondary | ICD-10-CM | POA: Diagnosis not present

## 2017-04-10 DIAGNOSIS — I2510 Atherosclerotic heart disease of native coronary artery without angina pectoris: Secondary | ICD-10-CM | POA: Diagnosis present

## 2017-04-10 DIAGNOSIS — Z96612 Presence of left artificial shoulder joint: Secondary | ICD-10-CM | POA: Diagnosis present

## 2017-04-10 DIAGNOSIS — Z7983 Long term (current) use of bisphosphonates: Secondary | ICD-10-CM

## 2017-04-10 DIAGNOSIS — N185 Chronic kidney disease, stage 5: Secondary | ICD-10-CM | POA: Diagnosis not present

## 2017-04-10 DIAGNOSIS — Z9049 Acquired absence of other specified parts of digestive tract: Secondary | ICD-10-CM | POA: Diagnosis not present

## 2017-04-10 DIAGNOSIS — Z8711 Personal history of peptic ulcer disease: Secondary | ICD-10-CM

## 2017-04-10 DIAGNOSIS — IMO0002 Reserved for concepts with insufficient information to code with codable children: Secondary | ICD-10-CM | POA: Diagnosis present

## 2017-04-10 DIAGNOSIS — I701 Atherosclerosis of renal artery: Secondary | ICD-10-CM | POA: Diagnosis present

## 2017-04-10 DIAGNOSIS — Z66 Do not resuscitate: Secondary | ICD-10-CM | POA: Diagnosis not present

## 2017-04-10 DIAGNOSIS — D699 Hemorrhagic condition, unspecified: Secondary | ICD-10-CM | POA: Diagnosis not present

## 2017-04-10 DIAGNOSIS — E1165 Type 2 diabetes mellitus with hyperglycemia: Secondary | ICD-10-CM | POA: Diagnosis not present

## 2017-04-10 DIAGNOSIS — F17210 Nicotine dependence, cigarettes, uncomplicated: Secondary | ICD-10-CM | POA: Diagnosis present

## 2017-04-10 DIAGNOSIS — R4182 Altered mental status, unspecified: Secondary | ICD-10-CM | POA: Diagnosis not present

## 2017-04-10 DIAGNOSIS — B029 Zoster without complications: Secondary | ICD-10-CM | POA: Diagnosis not present

## 2017-04-10 DIAGNOSIS — M84359S Stress fracture, hip, unspecified, sequela: Secondary | ICD-10-CM | POA: Diagnosis not present

## 2017-04-10 DIAGNOSIS — I739 Peripheral vascular disease, unspecified: Secondary | ICD-10-CM | POA: Diagnosis present

## 2017-04-10 DIAGNOSIS — R269 Unspecified abnormalities of gait and mobility: Secondary | ICD-10-CM | POA: Diagnosis not present

## 2017-04-10 DIAGNOSIS — D62 Acute posthemorrhagic anemia: Secondary | ICD-10-CM | POA: Diagnosis present

## 2017-04-10 DIAGNOSIS — E1129 Type 2 diabetes mellitus with other diabetic kidney complication: Secondary | ICD-10-CM | POA: Diagnosis not present

## 2017-04-10 DIAGNOSIS — H9190 Unspecified hearing loss, unspecified ear: Secondary | ICD-10-CM | POA: Diagnosis present

## 2017-04-10 DIAGNOSIS — R030 Elevated blood-pressure reading, without diagnosis of hypertension: Secondary | ICD-10-CM | POA: Diagnosis not present

## 2017-04-10 DIAGNOSIS — S42122D Displaced fracture of acromial process, left shoulder, subsequent encounter for fracture with routine healing: Secondary | ICD-10-CM | POA: Diagnosis not present

## 2017-04-10 DIAGNOSIS — R809 Proteinuria, unspecified: Secondary | ICD-10-CM | POA: Diagnosis present

## 2017-04-10 DIAGNOSIS — D631 Anemia in chronic kidney disease: Secondary | ICD-10-CM | POA: Diagnosis present

## 2017-04-10 DIAGNOSIS — J810 Acute pulmonary edema: Secondary | ICD-10-CM | POA: Diagnosis not present

## 2017-04-10 DIAGNOSIS — S52591A Other fractures of lower end of right radius, initial encounter for closed fracture: Secondary | ICD-10-CM | POA: Diagnosis not present

## 2017-04-10 LAB — BASIC METABOLIC PANEL
ANION GAP: 11 (ref 5–15)
BUN: 55 mg/dL — ABNORMAL HIGH (ref 6–20)
CALCIUM: 8.6 mg/dL — AB (ref 8.9–10.3)
CO2: 23 mmol/L (ref 22–32)
Chloride: 109 mmol/L (ref 101–111)
Creatinine, Ser: 3.51 mg/dL — ABNORMAL HIGH (ref 0.61–1.24)
GFR, EST AFRICAN AMERICAN: 16 mL/min — AB (ref >60)
GFR, EST NON AFRICAN AMERICAN: 14 mL/min — AB (ref >60)
Glucose, Bld: 111 mg/dL — ABNORMAL HIGH (ref 65–99)
POTASSIUM: 4.5 mmol/L (ref 3.5–5.1)
SODIUM: 143 mmol/L (ref 135–145)

## 2017-04-10 LAB — MRSA PCR SCREENING: MRSA by PCR: NEGATIVE

## 2017-04-10 LAB — HEMOGLOBIN AND HEMATOCRIT, BLOOD
HCT: 24.7 % — ABNORMAL LOW (ref 39.0–52.0)
HCT: 27.9 % — ABNORMAL LOW (ref 39.0–52.0)
Hemoglobin: 8.1 g/dL — ABNORMAL LOW (ref 13.0–17.0)
Hemoglobin: 9 g/dL — ABNORMAL LOW (ref 13.0–17.0)

## 2017-04-10 LAB — PROTIME-INR
INR: 4.86
Prothrombin Time: 45 seconds — ABNORMAL HIGH (ref 11.4–15.2)

## 2017-04-10 LAB — URINALYSIS, ROUTINE W REFLEX MICROSCOPIC
Bilirubin Urine: NEGATIVE
GLUCOSE, UA: NEGATIVE mg/dL
KETONES UR: NEGATIVE mg/dL
NITRITE: NEGATIVE
PROTEIN: 100 mg/dL — AB
Specific Gravity, Urine: 1.011 (ref 1.005–1.030)
pH: 5 (ref 5.0–8.0)

## 2017-04-10 LAB — I-STAT TROPONIN, ED: Troponin i, poc: 0.01 ng/mL (ref 0.00–0.08)

## 2017-04-10 LAB — GLUCOSE, CAPILLARY
Glucose-Capillary: 58 mg/dL — ABNORMAL LOW (ref 65–99)
Glucose-Capillary: 198 mg/dL — ABNORMAL HIGH (ref 65–99)
Glucose-Capillary: 161 mg/dL — ABNORMAL HIGH (ref 65–99)

## 2017-04-10 LAB — CBC
HEMATOCRIT: 23.1 % — AB (ref 39.0–52.0)
Hemoglobin: 7.6 g/dL — ABNORMAL LOW (ref 13.0–17.0)
MCH: 30.2 pg (ref 26.0–34.0)
MCHC: 32.9 g/dL (ref 30.0–36.0)
MCV: 91.7 fL (ref 78.0–100.0)
Platelets: 126 10*3/uL — ABNORMAL LOW (ref 150–400)
RBC: 2.52 MIL/uL — ABNORMAL LOW (ref 4.22–5.81)
RDW: 17 % — ABNORMAL HIGH (ref 11.5–15.5)
WBC: 7 10*3/uL (ref 4.0–10.5)

## 2017-04-10 LAB — PREPARE RBC (CROSSMATCH)

## 2017-04-10 LAB — BRAIN NATRIURETIC PEPTIDE: B Natriuretic Peptide: 383 pg/mL — ABNORMAL HIGH (ref 0.0–100.0)

## 2017-04-10 LAB — POC OCCULT BLOOD, ED: Fecal Occult Bld: POSITIVE — AB

## 2017-04-10 MED ORDER — INSULIN GLARGINE 100 UNIT/ML ~~LOC~~ SOLN
10.0000 [IU] | Freq: Every day | SUBCUTANEOUS | Status: DC
  Administered 2017-04-10: 10 [IU] via SUBCUTANEOUS
  Filled 2017-04-10 (×2): qty 0.1

## 2017-04-10 MED ORDER — FUROSEMIDE 10 MG/ML IJ SOLN
20.0000 mg | Freq: Once | INTRAMUSCULAR | Status: AC
  Administered 2017-04-10: 20 mg via INTRAVENOUS
  Filled 2017-04-10: qty 2

## 2017-04-10 MED ORDER — POLYETHYLENE GLYCOL 3350 17 G PO PACK: 17.0000 g | PACK | Freq: Every day | ORAL | Status: DC | PRN

## 2017-04-10 MED ORDER — ALBUTEROL SULFATE (2.5 MG/3ML) 0.083% IN NEBU
3.0000 mL | INHALATION_SOLUTION | Freq: Four times a day (QID) | RESPIRATORY_TRACT | Status: DC | PRN
  Administered 2017-04-12 – 2017-04-19 (×6): 3 mL via RESPIRATORY_TRACT
  Filled 2017-04-10 (×6): qty 3

## 2017-04-10 MED ORDER — LORATADINE 10 MG PO TABS
10.0000 mg | ORAL_TABLET | Freq: Every day | ORAL | Status: DC
  Administered 2017-04-10 – 2017-04-26 (×17): 10 mg via ORAL
  Filled 2017-04-10 (×18): qty 1

## 2017-04-10 MED ORDER — MELATONIN 5 MG PO TABS
5.0000 mg | ORAL_TABLET | Freq: Every day | ORAL | Status: DC
  Administered 2017-04-10 – 2017-04-16 (×7): 5 mg via ORAL
  Filled 2017-04-10 (×8): qty 1

## 2017-04-10 MED ORDER — DEXTROSE 50 % IV SOLN
INTRAVENOUS | Status: AC
Start: 2017-04-10 — End: 2017-04-10
  Administered 2017-04-10: 50 mL
  Filled 2017-04-10: qty 50

## 2017-04-10 MED ORDER — BUPROPION HCL ER (XL) 150 MG PO TB24
150.0000 mg | ORAL_TABLET | Freq: Every day | ORAL | Status: DC
  Administered 2017-04-10 – 2017-04-26 (×17): 150 mg via ORAL
  Filled 2017-04-10 (×17): qty 1

## 2017-04-10 MED ORDER — ALENDRONATE SODIUM 70 MG PO TABS: 70.0000 mg | ORAL_TABLET | ORAL | Status: DC

## 2017-04-10 MED ORDER — TRAMADOL HCL 50 MG PO TABS
50.0000 mg | ORAL_TABLET | Freq: Two times a day (BID) | ORAL | Status: DC | PRN
  Administered 2017-04-25: 50 mg via ORAL
  Filled 2017-04-10: qty 1

## 2017-04-10 MED ORDER — ONDANSETRON HCL 4 MG/2ML IJ SOLN
4.0000 mg | Freq: Four times a day (QID) | INTRAMUSCULAR | Status: DC | PRN
  Filled 2017-04-10: qty 2

## 2017-04-10 MED ORDER — FERROUS SULFATE 325 (65 FE) MG PO TABS
325.0000 mg | ORAL_TABLET | Freq: Two times a day (BID) | ORAL | Status: DC
  Administered 2017-04-10 – 2017-04-20 (×22): 325 mg via ORAL
  Filled 2017-04-10 (×22): qty 1

## 2017-04-10 MED ORDER — SIMVASTATIN 20 MG PO TABS
20.0000 mg | ORAL_TABLET | Freq: Every evening | ORAL | Status: DC
  Administered 2017-04-10 – 2017-04-25 (×16): 20 mg via ORAL
  Filled 2017-04-10 (×16): qty 1

## 2017-04-10 MED ORDER — SODIUM CHLORIDE 0.9% FLUSH
3.0000 mL | Freq: Two times a day (BID) | INTRAVENOUS | Status: DC
  Administered 2017-04-10 – 2017-04-25 (×29): 3 mL via INTRAVENOUS

## 2017-04-10 MED ORDER — SODIUM CHLORIDE 0.9 % IV SOLN: Freq: Once | INTRAVENOUS | Status: DC

## 2017-04-10 MED ORDER — FINASTERIDE 5 MG PO TABS
5.0000 mg | ORAL_TABLET | Freq: Every day | ORAL | Status: DC
  Administered 2017-04-10 – 2017-04-25 (×16): 5 mg via ORAL
  Filled 2017-04-10 (×16): qty 1

## 2017-04-10 MED ORDER — ACETAMINOPHEN 325 MG PO TABS
650.0000 mg | ORAL_TABLET | Freq: Four times a day (QID) | ORAL | Status: DC | PRN
  Administered 2017-04-16: 650 mg via ORAL
  Filled 2017-04-10: qty 2

## 2017-04-10 MED ORDER — FUROSEMIDE 10 MG/ML IJ SOLN
40.0000 mg | Freq: Two times a day (BID) | INTRAMUSCULAR | Status: DC
  Administered 2017-04-10 – 2017-04-11 (×2): 40 mg via INTRAVENOUS
  Filled 2017-04-10 (×2): qty 4

## 2017-04-10 MED ORDER — PANTOPRAZOLE SODIUM 40 MG IV SOLR: 40.0000 mg | Freq: Two times a day (BID) | INTRAVENOUS | Status: DC

## 2017-04-10 MED ORDER — PANTOPRAZOLE SODIUM 40 MG IV SOLR
40.0000 mg | Freq: Two times a day (BID) | INTRAVENOUS | Status: DC
  Administered 2017-04-10 – 2017-04-11 (×4): 40 mg via INTRAVENOUS
  Filled 2017-04-10 (×4): qty 40

## 2017-04-10 MED ORDER — ONDANSETRON HCL 4 MG PO TABS: 4.0000 mg | ORAL_TABLET | Freq: Four times a day (QID) | ORAL | Status: DC | PRN

## 2017-04-10 MED ORDER — METOPROLOL SUCCINATE ER 50 MG PO TB24
50.0000 mg | ORAL_TABLET | Freq: Two times a day (BID) | ORAL | Status: DC
  Administered 2017-04-10 – 2017-04-14 (×9): 50 mg via ORAL
  Filled 2017-04-10 (×9): qty 1

## 2017-04-10 MED ORDER — FLUTICASONE PROPIONATE 50 MCG/ACT NA SUSP
1.0000 | Freq: Two times a day (BID) | NASAL | Status: DC
  Administered 2017-04-10 – 2017-04-26 (×31): 1 via NASAL
  Filled 2017-04-10 (×2): qty 16

## 2017-04-10 MED ORDER — ACETAMINOPHEN 650 MG RE SUPP: 650.0000 mg | Freq: Four times a day (QID) | RECTAL | Status: DC | PRN

## 2017-04-10 MED ORDER — UMECLIDINIUM-VILANTEROL 62.5-25 MCG/INH IN AEPB
1.0000 | INHALATION_SPRAY | Freq: Every day | RESPIRATORY_TRACT | Status: DC
  Administered 2017-04-11 – 2017-04-26 (×12): 1 via RESPIRATORY_TRACT
  Filled 2017-04-10 (×2): qty 14

## 2017-04-10 MED ORDER — DOXAZOSIN MESYLATE 4 MG PO TABS
4.0000 mg | ORAL_TABLET | Freq: Every day | ORAL | Status: DC
  Administered 2017-04-10 – 2017-04-22 (×12): 4 mg via ORAL
  Filled 2017-04-10 (×13): qty 1

## 2017-04-10 NOTE — Plan of Care (Signed)
[name] is a 84 year old male with pmh of HTN, Afib on coumadin, COPD, status post bovine AVR, diastolic CHF, DM, thrombocytopenia, depression; who presents with complaints of worsening shortness of breath and dark stools.  Just hospitalized from 1/14-1/17. Guaiac stool positive. Hbg 8.3-> 7.6, INR 3.6.  BUN 55, Cr 3.51(previously Cr 1.54 last month),  d-dimer 0.85. CXR reveals pulmonary edema.  The signout from Dr. Sherry Ruffing.  Order for patient to receive at least 1 unit of packed red blood cells with 20 mg of Lasix IV.  Supratherapeutic INR with CHF exacerbation with GI bleed and acute renal failure.  Evaluate in a.m. no bed order placed

## 2017-04-10 NOTE — Consult Note (Signed)
Consultation  Referring Provider:  Dr. Herbert Moors    Primary Care Physician:  [name], MD Primary Gastroenterologist: Dr. Henrene Pastor (saw in consult in 2018)       Reason for Consultation: Melena, Anemia    Impression / Plan:   Impression: 1. Melena: Poor historian, initially described 2 days of black stools, today describes 6 months of black stools, hemoglobin is minimally decreased from previous a month ago, no GI complaints, INR is elevated at 4.86, likely this is contributory; consider possible upper GI bleed vs discussion below 2. Anemia: hgb 7.6 (8.3 a mos ago)- likely multifactorial with multiple chronic disease conditions, also consider relation to reported melena 3. CHF exacerbation  Plan: 1. Continue supportive measures and treatment for CHF 2.  Hold anticoagulation and allow INR to drift downward 3. Continue to observe hgb with transfusion as needed <7 4. Agree with empiric Pantoprazole 40mg  BID 5. Diet per hospitalist, no restrictions from GI standpoint 6. Will discuss with Dr. Carlean Purl, no plans for emergent procedures at this time  Thank you for your kind consultation, we will continue to follow.  Lavone Nian [facility]  04/10/2017, 9:39 AM Pager #: [phone]          [place] [facility] Attending   I have taken an interval history, reviewed the chart and examined the patient. I agree with the Advanced Practitioner's note, impression and recommendations.   Complicated situation - hard to tell how much bleeding he has had - Hgb down 1 g Will see what happens over admission and as INR comes down. Higher risk to scope. Would consider stopping alendronate as it is an ulcerogenic medication. Other ? Is whether or not to stay on warfarin. At least not now and discuss continuation with his PCP/primary cardiologist and family  If bleeding increases would consider reversal with FFP/vit K  Gatha Mayer, MD, [facility] Gastroenterology [phone] (pager) 04/10/2017 4:54  PM      HPI:   Ronald [name] is a 84 y.o. male with an extensive cardiac history including aortic stenosis with a valve replacement in 2012, chronic A. fib on Coumadin, chronic diastolic CHF, COPD, stage III CKD, depression, thrombocytopenia and multiple others listed below, who presented to the ER today with a complaint of shortness of breath.  We were consulted in regards to a history of melena and a finding of anemia.    Today , it is difficult to get a history from the patient that as he is very hard of hearing and in a loud room.  He is able to tell me that he came into the hospital today due to shortness of breath.  He resides in a nursing home and when he told him this they brought him to the ER.    When questioned regarding black stools the patient tells me this has been occurring for "6 months on a daily basis".  (Previously described this as several episodes of dark tarry diarrhea over the past 2 days per H&P) Patient tells me initially he was worried that this may be "old blood" and he thought it may stop, but it did not.  ER course: hemoccult +, hgb 7.6 (8.3 a mos ago), BUN 55, Crea 3.51 (baseline 1.54), htn in the 299B-716R systolic, requiring O2 via [place], INR 4.86, CXR with vascular congestion consistent with CHF exacerbation with pleural effusions  GI history: 07/02/16-consultation for painless hematochezia, Dr. Henrene Pastor: At that time his INR was high, suspect this is diverticular, when the  INR drifted down his bleeding stopped he was supported with blood products during his 2-day admission 08/20/06-colonoscopy, Dr. Sharlett Iles: Done for surveillance of adenomatous polyps; findings: Diverticulosis of the colon as well as lipomatous IC valve + History of hyperplastic polyp in 1989/06/20, tubular adenoma in 2003-06-21, brother passed away at 68 from colon cancer  Past Medical History:  Diagnosis Date  . AS (aortic stenosis)    bovine aortic valve replacement 01/07/11  . Bladder cancer [facility])    Bladder  Cancer local  . Blood transfusion    w/hip operation  . Cellulitis of left lower extremity   . Chronic atrial fibrillation ([place])   . Chronic diastolic congestive heart failure ([place])   . Claudication in peripheral vascular disease ([place]) 06/17/2011  . COPD (chronic obstructive pulmonary disease) ([place])   . Depression wife died 4 years ago.    Marland Kitchen History of stomach ulcers ~ 1951  . HTN (hypertension)   . Neuropathy due to secondary diabetes ([place])   . Peripheral vascular disease ([place]) very poor circulation legs and feet ... stents right and left legs... done in dr j. Gwenlyn Found 's office.   . Pneumonia 07/25/11   left  . Recurrent upper respiratory infection (URI)    sinusitis  . Renal artery stenosis ([place]) 06-20-04   renal artery stent  . S/P angioplasty with stent, diamond back rotational athrectomy Prox. Rt. SFA 06/17/2011 06/17/2011  . Shortness of breath   . Thrombocytopenia due to drugs    seen by Dr Inda Merlin plts [id number] no rx  . Type II diabetes mellitus ([place])     Past Surgical History:  Procedure Laterality Date  . ABDOMINAL ANGIOGRAM  06/17/2011   Procedure: ABDOMINAL ANGIOGRAM;  Surgeon: Lorretta Harp, MD;  Location: [facility] CATH LAB;  Service: Cardiovascular;;  . AORTIC VALVE REPLACEMENT  01/07/2011   Procedure: AORTIC VALVE REPLACEMENT (AVR);  Surgeon: Grace Isaac, MD;  Location: [place];  Service: Open Heart Surgery;  Laterality: N/A;; magna-ease bovine 2mm bioprosthesis  . ATHERECTOMY N/A 06/17/2011   Procedure: ATHERECTOMY;  Surgeon: Lorretta Harp, MD;  Location: [facility] CATH LAB;  Service: Cardiovascular;  Laterality: N/A;  . CARDIAC CATHETERIZATION  11/19/10   normal coronaries, mod AS, 75% l RAS  . CARDIOVERSION  04/03/2011   Procedure: CARDIOVERSION;  Surgeon: Leonie Man, MD;  Location: [place];  Service: Cardiovascular;  Laterality: N/A;  . CATARACT EXTRACTION W/ INTRAOCULAR LENS  IMPLANT, BILATERAL  ~ 06-20-05  . CHOLECYSTECTOMY    . FEMUR IM NAIL  06/27/2011   Procedure:  INTRAMEDULLARY (IM) NAIL FEMORAL;  Surgeon: Marin Shutter, MD;  Location: [facility] ORS;  Service: Orthopedics;  Laterality: Right;  . FEMUR IM NAIL Left 12/14/2013   Procedure: INTRAMEDULLARY (IM) NAIL FEMORAL;  Surgeon: Mauri Pole, MD;  Location: [facility] ORS;  Service: Orthopedics;  Laterality: Left;  . FRACTURE SURGERY    . LOWER EXTREMITY ANGIOGRAM  06/17/2011   diamondback orbital rotational and cutting balloon atherectomy of the prox R SFA  . LOWER EXTREMITY ANGIOGRAM Bilateral 06/17/2011   Procedure: LOWER EXTREMITY ANGIOGRAM;  Surgeon: Lorretta Harp, MD;  Location: [facility] CATH LAB;  Service: Cardiovascular;  Laterality: Bilateral;  . PERIPHERAL ARTERIAL STENT GRAFT     June 20, 2004 left anf right illiac stents Dr Deon Pilling  . RENAL ARTERY STENT  20-Jun-2004   "I believe"  . REVERSE SHOULDER ARTHROPLASTY Left 12/31/2016   Procedure: LEFT REVERSE SHOULDER ARTHROPLASTY;  Surgeon: Nicholes Stairs, MD;  Location: [place];  Service:  Orthopedics;  Laterality: Left;  . TONSILLECTOMY AND ADENOIDECTOMY     "when I was a kid"    Family History  Problem Relation Age of Onset  . Heart disease Mother   . Cancer Brother        colon  . Stroke Father      Social History   Tobacco Use  . Smoking status: Current Every Day Smoker    Packs/day: 1.50    Years: 75.00    Pack years: 112.50    Types: Cigarettes  . Smokeless tobacco: Never Used  Substance Use Topics  . Alcohol use: No    Alcohol/week: 1.2 oz    Types: 2 Cans of beer per week  . Drug use: No    Prior to Admission medications   Medication Sig Start Date End Date Taking? Authorizing Provider  acetaminophen (TYLENOL) 325 MG tablet Take 650 mg by mouth every 6 (six) hours as needed for mild pain.   Yes [provider]  albuterol (ACCUNEB) 0.63 MG/3ML nebulizer solution Take 3 mLs by nebulization every 6 (six) hours as needed for wheezing or shortness of breath.   Yes [provider]  alendronate (FOSAMAX) 70 MG tablet Take 70 mg by  mouth every 14 (fourteen) days. Take every other sunday 12/14/16  Yes [provider]  benazepril (LOTENSIN) 20 MG tablet Take 20 mg by mouth daily.   Yes [provider]  buPROPion (WELLBUTRIN XL) 150 MG 24 hr tablet Take 150 mg by mouth daily.    Yes [provider]  doxazosin (CARDURA) 4 MG tablet Take 4 mg by mouth daily.    Yes Collins, Gina L, PA-C  ferrous sulfate 325 (65 FE) MG EC tablet Take 1 tablet (325 mg total) by mouth 2 (two) times daily. 07/04/16 07/04/17 Yes Lavina Hamman, MD  finasteride (PROSCAR) 5 MG tablet Take 5 mg by mouth at bedtime.    Yes [provider]  fluticasone (FLONASE) 50 MCG/ACT nasal spray Place 1 spray into both nostrils 2 (two) times daily.    Yes [provider]  furosemide (LASIX) 40 MG tablet Take 1 tablet (40 mg total) by mouth daily. 07/04/16  Yes Lavina Hamman, MD  glyBURIDE (DIABETA) 5 MG tablet Take 2.5-10 mg by mouth See admin instructions. 10mg  by mouth in the morning. 2.5mg  in the evening   Yes [provider]  Insulin Glargine (LANTUS) 100 UNIT/ML Solostar Pen Inject 10 Units into the skin daily.    Yes [provider]  loratadine (CLARITIN) 10 MG tablet Take 10 mg by mouth daily.   Yes [provider]  Melatonin 5 MG TABS Take 5 mg by mouth at bedtime.   Yes [provider]  metoprolol succinate (TOPROL-XL) 50 MG 24 hr tablet Take 50 mg by mouth 2 (two) times daily. Take with or immediately following a meal.   Yes [provider]  polyethylene glycol (MIRALAX / GLYCOLAX) packet Take 17 g by mouth daily. 12/25/16  Yes Mariel Aloe, MD  potassium chloride SA (K-DUR,KLOR-CON) 20 MEQ tablet Take 20 mEq by mouth daily.    Yes [provider]  simvastatin (ZOCOR) 20 MG tablet Take 20 mg by mouth every evening.   Yes [provider]  traMADol (ULTRAM) 50 MG tablet Take 1 tablet (50 mg total) by mouth every 12 (twelve) hours as needed for moderate  pain. 03/13/17  Yes Arrien, Jimmy Picket, MD  Umeclidinium-Vilanterol [facility] ELLIPTA) 62.5-25 MCG/INH AEPB Inhale 1  puff into the lungs daily.   Yes [provider]  warfarin (COUMADIN) 2 MG tablet Take 1-2 mg by mouth See admin instructions. 1mg  by mouth once daily in the evening on Tuesday and Thursday. 2mg  by mouth once daily in the evening on Monday, Wednesday, Friday, Saturday, Sunday 12/15/16  Yes [provider]    Current Facility-Administered Medications  Medication Dose Route Frequency Provider Last Rate Last Dose  . 0.9 %  sodium chloride infusion   Intravenous Once Smith, Rondell A, MD      . furosemide (LASIX) injection 20 mg  20 mg Intravenous Once Norval Morton, MD       Current Outpatient Medications  Medication Sig Dispense Refill  . acetaminophen (TYLENOL) 325 MG tablet Take 650 mg by mouth every 6 (six) hours as needed for mild pain.    Marland Kitchen albuterol (ACCUNEB) 0.63 MG/3ML nebulizer solution Take 3 mLs by nebulization every 6 (six) hours as needed for wheezing or shortness of breath.    Marland Kitchen alendronate (FOSAMAX) 70 MG tablet Take 70 mg by mouth every 14 (fourteen) days. Take every other sunday  2  . benazepril (LOTENSIN) 20 MG tablet Take 20 mg by mouth daily.    Marland Kitchen buPROPion (WELLBUTRIN XL) 150 MG 24 hr tablet Take 150 mg by mouth daily.     Marland Kitchen doxazosin (CARDURA) 4 MG tablet Take 4 mg by mouth daily.     . ferrous sulfate 325 (65 FE) MG EC tablet Take 1 tablet (325 mg total) by mouth 2 (two) times daily. 60 tablet 0  . finasteride (PROSCAR) 5 MG tablet Take 5 mg by mouth at bedtime.     . fluticasone (FLONASE) 50 MCG/ACT nasal spray Place 1 spray into both nostrils 2 (two) times daily.     . furosemide (LASIX) 40 MG tablet Take 1 tablet (40 mg total) by mouth daily. 60 tablet 0  . glyBURIDE (DIABETA) 5 MG tablet Take 2.5-10 mg by mouth See admin instructions. 10mg  by mouth in the morning. 2.5mg  in the evening    . Insulin Glargine (LANTUS) 100 UNIT/ML  Solostar Pen Inject 10 Units into the skin daily.     Marland Kitchen loratadine (CLARITIN) 10 MG tablet Take 10 mg by mouth daily.    . Melatonin 5 MG TABS Take 5 mg by mouth at bedtime.    . metoprolol succinate (TOPROL-XL) 50 MG 24 hr tablet Take 50 mg by mouth 2 (two) times daily. Take with or immediately following a meal.    . polyethylene glycol (MIRALAX / GLYCOLAX) packet Take 17 g by mouth daily. 14 each 0  . potassium chloride SA (K-DUR,KLOR-CON) 20 MEQ tablet Take 20 mEq by mouth daily.     . simvastatin (ZOCOR) 20 MG tablet Take 20 mg by mouth every evening.    . traMADol (ULTRAM) 50 MG tablet Take 1 tablet (50 mg total) by mouth every 12 (twelve) hours as needed for moderate pain. 10 tablet 0  . Umeclidinium-Vilanterol (ANORO ELLIPTA) 62.5-25 MCG/INH AEPB Inhale 1 puff into the lungs daily.    Marland Kitchen warfarin (COUMADIN) 2 MG tablet Take 1-2 mg by mouth See admin instructions. 1mg  by mouth once daily in the evening on Tuesday and Thursday. 2mg  by mouth once daily in the evening on Monday, Wednesday, Friday, Saturday, Sunday  3    Allergies as of 04/10/2017 - Review Complete 04/10/2017  Allergen Reaction Noted  . Aspirin Anaphylaxis, Shortness Of Breath, and Rash 05/30/2008     Review  of Systems:    Constitutional: No weight loss Skin: No rash Cardiovascular: No chest pain Respiratory: + SOB Gastrointestinal: See HPI and otherwise negative Genitourinary: No dysuria Neurological: No dizziness Musculoskeletal: No new muscle pain Hematologic: No bruising Psychiatric: + depression   Physical Exam:  Vital signs in last 24 hours: Pulse Rate:  [56-75] 67 (02/14 0900) Resp:  [13-26] 22 (02/14 0900) BP: (163-192)/(64-88) 182/67 (02/14 0900) SpO2:  [95 %-100 %] 100 % (02/14 0900)   General:   Pleasant Elderly Caucasian male appears to be in NAD, Well developed, Well nourished, alert and cooperative Head:  Normocephalic and atraumatic. Eyes:   PEERL, EOMI. No icterus. Conjunctiva pink. Ears:   HOH Neck:  Supple Throat: Oral cavity and pharynx without inflammation, swelling or lesion.poor dentition Lungs: Increased work of breathing, crackles at b/l bases Heart: irregularly irregular, 2/6 murmur Abdomen:  Soft, nondistended, nontender. No rebound or guarding. Normal bowel sounds. No appreciable masses or hepatomegaly. Rectal:  Not performed.  Msk:  Symmetrical without gross deformities. Peripheral pulses intact.  Extremities: trace LE edema Neurologic:  Alert and  oriented x4;  grossly normal neurologically.  Skin:   Dry and intact without significant lesions or rashes. Psychiatric: Demonstrates good judgement and reason without abnormal affect or behaviors. Memory impairment   LAB RESULTS: Recent Labs    04/10/17 0251  WBC 7.0  HGB 7.6*  HCT 23.1*  PLT 126*   BMET Recent Labs    04/10/17 0251  NA 143  K 4.5  CL 109  CO2 23  GLUCOSE 111*  BUN 55*  CREATININE 3.51*  CALCIUM 8.6*   PT/INR Recent Labs    04/10/17 0402  LABPROT 45.0*  INR 4.86*    STUDIES: Dg Chest 2 View  Result Date: 04/10/2017 CLINICAL DATA:  Shortness of breath. EXAM: CHEST  2 VIEW COMPARISON:  Radiograph 03/10/2017 FINDINGS: Post median sternotomy with prosthetic aortic valve. Prior left atrial clipping. Unchanged cardiomegaly. Progressive pulmonary edema from prior exam. Increased bilateral pleural effusions. No pneumothorax. Left shoulder surgical hardware is partially included. IMPRESSION: Progressive pulmonary edema and increased bilateral pleural effusions consistent with worsening CHF. Electronically Signed   By: Jeb Levering M.D.   On: 04/10/2017 03:21

## 2017-04-10 NOTE — ED Provider Notes (Signed)
[facility] DEPT Provider Note   CSN: [id number] Arrival date & time: 04/10/17  5400     History   Chief Complaint Chief Complaint  Patient presents with  . Shortness of Breath    HPI Ronald [name] is a 84 y.o. male.  The history is provided by the patient and medical records. No language interpreter was used.  Shortness of Breath  This is a recurrent problem. The average episode lasts 1 week. The problem occurs continuously.The current episode started more than 1 week ago. The problem has not changed since onset.Associated symptoms include cough and leg swelling. Pertinent negatives include no fever, no headaches, no rhinorrhea, no neck pain, no sputum production, no hemoptysis, no wheezing, no chest pain, no syncope, no vomiting and no abdominal pain. He has tried nothing for the symptoms. The treatment provided no relief. He has had prior hospitalizations. Associated medical issues include COPD, CAD and heart failure.    Past Medical History:  Diagnosis Date  . AS (aortic stenosis)    bovine aortic valve replacement 01/07/11  . Bladder cancer [facility])    Bladder Cancer local  . Blood transfusion    w/hip operation  . Cellulitis of left lower extremity   . Chronic atrial fibrillation ([place])   . Chronic diastolic congestive heart failure ([place])   . Claudication in peripheral vascular disease ([place]) 06/17/2011  . COPD (chronic obstructive pulmonary disease) ([place])   . Depression wife died 4 years ago.    Marland Kitchen History of stomach ulcers ~ 1951  . HTN (hypertension)   . Neuropathy due to secondary diabetes ([place])   . Peripheral vascular disease ([place]) very poor circulation legs and feet ... stents right and left legs... done in dr j. Gwenlyn Found 's office.   . Pneumonia 07/25/11   left  . Recurrent upper respiratory infection (URI)    sinusitis  . Renal artery stenosis ([place]) 2006   renal artery stent  . S/P angioplasty with stent, diamond back rotational  athrectomy Prox. Rt. SFA 06/17/2011 06/17/2011  . Shortness of breath   . Thrombocytopenia due to drugs    seen by Dr Inda Merlin plts [id number] no rx  . Type II diabetes mellitus [facility])     Patient Active Problem List   Diagnosis Date Noted  . Acute respiratory distress 03/10/2017  . Skin excoriation 03/10/2017  . Closed fracture of left proximal humerus 12/26/2016  . Comminuted left humeral fracture 12/23/2016  . GI bleed 07/02/2016  . AKI (acute kidney injury) ([place]) 07/02/2016  . Lactic acidemia 07/02/2016  . Lower GI bleed   . Tobacco use 03/07/2015  . Diabetes mellitus type 2, uncontrolled ([place]) 12/20/2013  . Acute blood loss anemia 12/20/2013  . Allergic rhinitis 12/20/2013  . Intertrochanteric fracture of left femur ([place]) 12/12/2013  . Anemia 12/12/2013  . Right acetabular fracture ([place]) 04/11/2013  . Acetabular fracture ([place]) 04/11/2013  . Chronic renal insufficiency, stage IV (severe) ([place]) 12/24/2012  . Bilateral claudication of lower limb ([place]) 12/24/2012  . Cellulitis 12/02/2012  . History of tobacco abuse- 75 years, quit in Feb 2014 07/06/2012  . Acute on chronic diastolic (congestive) heart failure ([place]) 07/06/2012  . COPD (chronic obstructive pulmonary disease) ([place]) 05/27/2012  . Pulmonary nodules 05/27/2012  . Normal coronary arteries, cath 11/12 07/25/2011  . Normal left ventricular systolic function, Echo 8/67 07/25/2011  . Closed right hip fracture, ORIF 07/26/93 complicated by gluteal hematoma while being Coumadinized post op 06/27/2011  . Aspirin allergy, rash, SOB.  Pt is on Plavix 06/19/2011  . Chronic atrial fibrillation ([place]) 06/19/2011  . Thrombocytopenia ([place]) 01/11/2011  . Leukocytosis 01/11/2011  . S/P aortic valve replacement: #23 Magna Ease Edwards Pericardial Valve  November 2012 01/08/2011  . AS (aortic stenosis)- s/p tissue AVR 01/07/11   . CAROTID BRUIT- moderate ICA disease 5/14 05/31/2008  . Diabetes mellitus with complication ([place]) 79/03/4095  .  Hyperlipidemia 07/11/2007  . DEPRESSION 07/11/2007  . RESTLESS LEGS SYNDROME 07/11/2007  . Essential hypertension 07/11/2007  . PVD, Rt SFA PTA/HSRA 06/17/11 07/11/2007  . DIVERTICULOSIS, COLON 07/11/2007  . RENAL CYST 07/11/2007  . ARTHRITIS 07/11/2007  . SKIN CANCER, HX OF 07/11/2007  . RHEUMATIC HEART DISEASE, HX OF 07/11/2007    Past Surgical History:  Procedure Laterality Date  . ABDOMINAL ANGIOGRAM  06/17/2011   Procedure: ABDOMINAL ANGIOGRAM;  Surgeon: Lorretta Harp, MD;  Location[facility] C[facility] LAB;  Service: Cardiovascular;;  . AORTIC VALVE REPLACEMENT  01/07/2011   Procedure: AORTIC VALVE REPLACEMENT (AVR);  Surgeon: Grace Isaac, MD;  Location: [place];  Service: Open Heart Surgery;  Laterality: N/A;; magna-ease bovine 70mm bioprosthesis  . ATHERECTOMY N/A 06/17/2011   Procedure: ATHERECTOMY;  Surgeon: Lorretta Harp, MD;  Location: [facility] CATH LAB;  Service: Cardiovascular;  Laterality: N/A;  . CARDIAC CATHETERIZATION  11/19/10   normal coronaries, mod AS, 75% l RAS  . CARDIOVERSION  04/03/2011   Procedure: CARDIOVERSION;  Surgeon: Leonie Man, MD;  Location: [facility];  Service: Cardiovascular;  Laterality: N/A;  . CATARACT EXTRACTION W/ INTRAOCULAR LENS  IMPLANT, BILATERAL  ~ 2007  . CHOLECYSTECTOMY    . FEMUR IM NAIL  06/27/2011   Procedure: INTRAMEDULLARY (IM) NAIL FEMORAL;  Surgeon: Marin Shutter, MD;  Location: [facility];  Service: Orthopedics;  Laterality: Right;  . FEMUR IM NAIL Left 12/14/2013   Procedure: INTRAMEDULLARY (IM) NAIL FEMORAL;  Surgeon: Mauri Pole, MD;  Location: [facility] OR[facility];  Service: Orthopedics;  Laterality: Left;  . FRACTURE SURGERY    . LOWER EXTREMITY ANGIOGRAM  06/17/2011   diamondback orbital rotational and cutting balloon atherectomy of the prox R SFA  . LOWER EXTREMITY ANGIOGRAM Bilateral 06/17/2011   Procedure: LOWER EXTREMITY ANGIOGRAM;  Surgeon: Lorretta Harp, MD;  Location[facility] CATH LAB;  Service: Cardiovascular;  Laterality: Bilateral;  .  PERIPHERAL ARTERIAL STENT GRAFT     2006 left anf right illiac stents Dr Deon Pilling  . RENAL ARTERY STENT  2006   "I believe"  . REVERSE SHOULDER ARTHROPLASTY Left 12/31/2016   Procedure: LEFT REVERSE SHOULDER ARTHROPLASTY;  Surgeon: Nicholes Stairs, MD;  Location: [facility];  Service: Orthopedics;  Laterality: Left;  . TONSILLECTOMY AND ADENOIDECTOMY     "when I was a kid"       Home Medications    Prior to Admission medications   Medication Sig Start Date End Date Taking? Authorizing Provider  acetaminophen (TYLENOL) 325 MG tablet Take 650 mg by mouth every 6 (six) hours as needed for mild pain.    [provider]  albuterol (ACCUNEB) 0.63 MG/3ML nebulizer solution Take 3 mLs by nebulization every 6 (six) hours as needed for wheezing or shortness of breath.    [provider]  alendronate (FOSAMAX) 70 MG tablet Take 70 mg by mouth every 14 (fourteen) days. Take every other sunday 12/14/16   [provider]  benazepril (LOTENSIN) 20 MG tablet Take 20 mg by mouth daily.    [provider]  buPROPion (WELLBUTRIN XL) 150 MG 24 hr  tablet Take 150 mg by mouth daily.     [provider]  doxazosin (CARDURA) 4 MG tablet Take 4 mg by mouth daily.     Collins, Gina L, PA-C  ferrous sulfate 325 (65 FE) MG EC tablet Take 1 tablet (325 mg total) by mouth 2 (two) times daily. 07/04/16 07/04/17  Lavina Hamman, MD  finasteride (PROSCAR) 5 MG tablet Take 5 mg by mouth at bedtime.     [provider]  fluticasone (FLONASE) 50 MCG/ACT nasal spray Place 1 spray into both nostrils 2 (two) times daily.     [provider]  furosemide (LASIX) 40 MG tablet Take 1 tablet (40 mg total) by mouth daily. 07/04/16   Lavina Hamman, MD  glyBURIDE (DIABETA) 5 MG tablet Take 2.5-10 mg by mouth See admin instructions. 10mg  by mouth in the morning. 2.5mg  in the evening    [provider]  Insulin Glargine (LANTUS) 100 UNIT/ML Solostar Pen Inject 10  Units into the skin daily.     [provider]  loratadine (CLARITIN) 10 MG tablet Take 10 mg by mouth daily.    [provider]  Melatonin 5 MG TABS Take 5 mg by mouth at bedtime.    [provider]  metoprolol succinate (TOPROL-XL) 50 MG 24 hr tablet Take 50 mg by mouth 2 (two) times daily. Take with or immediately following a meal.    [provider]  polyethylene glycol (MIRALAX / GLYCOLAX) packet Take 17 g by mouth daily. 12/25/16   Mariel Aloe, MD  potassium chloride SA (K-DUR,KLOR-CON) 20 MEQ tablet Take 20 mEq by mouth daily.     [provider]  simvastatin (ZOCOR) 20 MG tablet Take 20 mg by mouth every evening.    [provider]  traMADol (ULTRAM) 50 MG tablet Take 1 tablet (50 mg total) by mouth every 12 (twelve) hours as needed for moderate pain. 03/13/17   Arrien, Jimmy Picket, MD  Umeclidinium-Vilanterol [facility] ELLIPTA) 62.5-25 MCG/INH AEPB Inhale 1 puff into the lungs daily.    [provider]  warfarin (COUMADIN) 2 MG tablet Take 1-2 mg by mouth See admin instructions. 1mg  by mouth once daily in the evening on Tuesday and Thursday 2mg  by mouth once daily in the evening on Monday, Wednesday, Friday, Saturday, Sunday 12/15/16   [provider]    Family History Family History  Problem Relation Age of Onset  . Heart disease Mother   . Cancer Brother        colon  . Stroke Father     Social History Social History   Tobacco Use  . Smoking status: Current Every Day Smoker    Packs/day: 1.50    Years: 75.00    Pack years: 112.50    Types: Cigarettes  . Smokeless tobacco: Never Used  Substance Use Topics  . Alcohol use: No    Alcohol/week: 1.2 oz    Types: 2 Cans of beer per week  . Drug use: No     Allergies   Aspirin   Review of Systems Review of Systems  Constitutional: Positive for fatigue. Negative for chills, diaphoresis and fever.  HENT: Negative for congestion, dental problem  and rhinorrhea.   Respiratory: Positive for cough and shortness of breath. Negative for hemoptysis, sputum production, chest tightness, wheezing and stridor.   Cardiovascular: Positive for leg swelling. Negative for chest pain, palpitations and syncope.  Gastrointestinal: Negative for abdominal pain, constipation, diarrhea, nausea and vomiting.  Genitourinary:  Negative for flank pain and frequency.  Musculoskeletal: Negative for back pain, neck pain and neck stiffness.  Neurological: Negative for light-headedness and headaches.  Psychiatric/Behavioral: Negative for agitation.  All other systems reviewed and are negative.    Physical Exam Updated Vital Signs BP (!) 163/86   Pulse 60   Resp 13   SpO2 96%   Physical Exam  Constitutional: He is oriented to person, place, and time. He appears well-developed. No distress.  HENT:  Head: Normocephalic.  Mouth/Throat: No oropharyngeal exudate.  Eyes: Conjunctivae and EOM are normal. Pupils are equal, round, and reactive to light.  Neck: Normal range of motion.  Cardiovascular: Normal rate and intact distal pulses.  Murmur heard. Pulmonary/Chest: Effort normal. No respiratory distress. He has no wheezes. He has no rhonchi. He has rales.  Abdominal: He exhibits no distension. There is no tenderness.  Musculoskeletal: He exhibits edema. He exhibits no tenderness.  Neurological: He is alert and oriented to person, place, and time. No sensory deficit.  Skin: Capillary refill takes less than 2 seconds. He is not diaphoretic. No erythema. No pallor.  Nursing note and vitals reviewed.    ED Treatments / Results  Labs (all labs ordered are listed, but only abnormal results are displayed) Labs Reviewed  BASIC METABOLIC PANEL - Abnormal; Notable for the following components:      Result Value   Glucose, Bld 111 (*)    BUN 55 (*)    Creatinine, Ser 3.51 (*)    Calcium 8.6 (*)    GFR calc non Af Amer 14 (*)    GFR calc Af Amer 16 (*)     All other components within normal limits  CBC - Abnormal; Notable for the following components:   RBC 2.52 (*)    Hemoglobin 7.6 (*)    HCT 23.1 (*)    RDW 17.0 (*)    Platelets 126 (*)    All other components within normal limits  BRAIN NATRIURETIC PEPTIDE - Abnormal; Notable for the following components:   B Natriuretic Peptide 383.0 (*)    All other components within normal limits  URINALYSIS, ROUTINE W REFLEX MICROSCOPIC - Abnormal; Notable for the following components:   APPearance HAZY (*)    Hgb urine dipstick LARGE (*)    Protein, ur 100 (*)    Leukocytes, UA TRACE (*)    Bacteria, UA RARE (*)    Squamous Epithelial / LPF 0-5 (*)    All other components within normal limits  PROTIME-INR - Abnormal; Notable for the following components:   Prothrombin Time 45.0 (*)    INR 4.86 (*)    All other components within normal limits  POC OCCULT BLOOD, ED - Abnormal; Notable for the following components:   Fecal Occult Bld POSITIVE (*)    All other components within normal limits  I-STAT TROPONIN, ED  TYPE AND SCREEN  PREPARE RBC (CROSSMATCH)    EKG  EKG Interpretation  Date/Time:  Thursday April 10 2017 02:58:22 EST Ventricular Rate:  63 PR Interval:    QRS Duration: 126 QT Interval:  482 QTC Calculation: 494 R Axis:   -59 Text Interpretation:  Atrial fibrillation Ventricular premature complex Left bundle branch block Artifact in lead(s) I III aVR aVL aVF V2 and baseline wander in lead(s) V3 When compared to prior,  no significant changes seen.  No STEMI Confirmed by Antony Blackbird [phone]) on 04/10/2017 3:14:02 AM Also confirmed by Antony Blackbird [phone]), editor Oswaldo Milian, [place] ([id number])  on 04/10/2017 7:24:25  AM       Radiology Dg Chest 2 View  Result Date: 04/10/2017 CLINICAL DATA:  Shortness of breath. EXAM: CHEST  2 VIEW COMPARISON:  Radiograph 03/10/2017 FINDINGS: Post median sternotomy with prosthetic aortic valve. Prior left atrial clipping. Unchanged  cardiomegaly. Progressive pulmonary edema from prior exam. Increased bilateral pleural effusions. No pneumothorax. Left shoulder surgical hardware is partially included. IMPRESSION: Progressive pulmonary edema and increased bilateral pleural effusions consistent with worsening CHF. Electronically Signed   By: Jeb Levering M.D.   On: 04/10/2017 03:21    Procedures Procedures (including critical care time)  Medications Ordered in ED Medications  0.9 %  sodium chloride infusion (not administered)  furosemide (LASIX) injection 20 mg (not administered)     Initial Impression / Assessment and Plan / ED Course  I have reviewed the triage vital signs and the nursing notes.  Pertinent labs & imaging results that were available during my care of the patient were reviewed by me and considered in my medical decision making (see chart for details).     Ronald [name] is a 84 y.o. male with a past medical history significant for aortic valve replacement, CAD with PCI, diabetes, COPD, CHF, hypertension, atrial fibrillation on Coumadin therapy, prior GI bleed who presents with fatigue, decreased oral intake, black stools, cough and shortness of breath.  Patient reports that for the last week he has had dark black stools similar to prior GI bleeds.  He reports that he has had shortness of breath with ambulation and at baseline.  He does not take oxygen at home.  He reports that he has had a nonproductive dry cough.  He denies fevers, chills, chest pain, or palpitations.  He denies any urinary symptoms, conservation, or diarrhea.  He reports he has had less appetite.  He denies recent trauma or other complaints.  He denies nausea, vomiting, or diaphoresis.  On exam, patient's lungs had crackles in all lung fields.  There were rales throughout.  No significant wheezing.  Chest was nontender.  Systolic murmur was appreciated.  Abdomen was nontender.  Rectal exam was performed and there was dark stool.  Guaiac  test positive.  Clinically, I am concerned about GI bleed and fluid overload for this patient.  EKG showed no STEMI.  BNP elevated at 383.  INR elevated at 4.6 from prior.  Troponin initially negative.  CBC showed hemoglobin dropped to 7.6 from 8.3 1 month ago.  Metabolic panel reveals acute kidney injury with a creatinine of 3.51 from prior.  Chest x-ray shows pulmonary edema and CHF exacerbation.  Given patient's GI bleed and being supratherapeutic with INR, AKI, and fluid overload, patient will require admission for further management.  Hospitalist team will be called for admission.   Final Clinical Impressions(s) / ED Diagnoses   Final diagnoses:  Acute on chronic congestive heart failure, unspecified heart failure type (HCC)  Gastrointestinal hemorrhage, unspecified gastrointestinal hemorrhage type  Elevated INR  AKI (acute kidney injury) ([place])    Clinical Impression: 1. Acute on chronic congestive heart failure, unspecified heart failure type ([place])   2. Gastrointestinal hemorrhage, unspecified gastrointestinal hemorrhage type   3. Elevated INR   4. AKI (acute kidney injury) ([place])     Disposition: Admit  This note was prepared with assistance of Dragon voice recognition software. Occasional wrong-word or sound-a-like substitutions may have occurred due to the inherent limitations of voice recognition software.     Demon Volante, Gwenyth Allegra, MD 04/10/17 [phone]

## 2017-04-10 NOTE — H&P (Signed)
History and Physical    Ronald [name] ATF:[id number] DOB: Jul 03, 1922 DOA: 04/10/2017  PCP: [name], MD   Patient coming from: Assisted living facility    Chief Complaint: Shortness of breath  HPI: Ronald [name] is a 84 y.o. male with medical history significant of peripheral vascular disease status post vent placement, renal artery stenosis that is post angioplasty with stent and rotational atherectomy in 2706, diastolic heart failure (last echo 03/12/2017), stage III sent chronic kidney disease, hypertension, paroxysmal atrial fibrillation on warfarin, COPD, aortic stenosis status post bovine AV valve replacement in 01/07/2011, hyperlipidemia who comes in with increasing shortness of breath.  Patient is an extremely poor historian.  Patient reports that approximately 2 days ago he began to develop worsening shortness of breath at night.  He cannot tell me if this was when he was lying flat.  He does report some lower extremity edema.  He denies any cough, congestion, rhinorrhea, sputum, fevers, chest pain, abdominal pain.  He also reported several episodes of "dark tarry diarrhea during this time".  He denies any hematemesis.  He is last had colonoscopies and endoscopies in 2008 and 2005 respectively.  He does have a history of prior GI bleeding and [facility]   ED Course: In the ED patient's vitals were notable for some hypertension to 237S-283T systolic.  He did require 2 L nasal cannula.  His INR was 4.86.  His hemoglobin was 7.6 with a prior approximately 1 month ago of 8.3.  His platelets are 126 which is appropriate for him.  His creatinine was noted to be 3.51 which is an increase of approximately 1.5 prior.  His BUN was elevated.  Chest x-ray showed vascular congestion consistent with congestive heart failure exacerbation along with pleural effusions. Review of Systems: As per HPI otherwise 10 point review of systems negative.     Past Medical History:    Diagnosis Date  . AS (aortic stenosis)    bovine aortic valve replacement 01/07/11  . Bladder cancer [facility])    Bladder Cancer local  . Blood transfusion    w/hip operation  . Cellulitis of left lower extremity   . Chronic atrial fibrillation ([place])   . Chronic diastolic congestive heart failure ([place])   . Claudication in peripheral vascular disease ([place]) 06/17/2011  . COPD (chronic obstructive pulmonary disease) ([place])   . Depression wife died 4 years ago.    Marland Kitchen History of stomach ulcers ~ 1951  . HTN (hypertension)   . Neuropathy due to secondary diabetes ([place])   . Peripheral vascular disease ([place]) very poor circulation legs and feet ... stents right and left legs... done in dr j. Gwenlyn Found 's office.   . Pneumonia 07/25/11   left  . Recurrent upper respiratory infection (URI)    sinusitis  . Renal artery stenosis ([place]) 2006   renal artery stent  . S/P angioplasty with stent, diamond back rotational athrectomy Prox. Rt. SFA 06/17/2011 06/17/2011  . Shortness of breath   . Thrombocytopenia due to drugs    seen by Dr Inda Merlin plts [id number] no rx  . Type II diabetes mellitus ([place])     Past Surgical History:  Procedure Laterality Date  . ABDOMINAL ANGIOGRAM  06/17/2011   Procedure: ABDOMINAL ANGIOGRAM;  Surgeon: Lorretta Harp, MD;  [facility] LAB;  Service: Cardiovascular;;  . AORTIC VALVE REPLACEMENT  01/07/2011   Procedure: AORTIC VALVE REPLACEMENT (AVR);  Surgeon: Grace Isaac, MD;  Location: [place];  Service: Open Heart Surgery;  Laterality: N/A;; magna-ease bovine 29mm bioprosthesis  . ATHERECTOMY N/A 06/17/2011   Procedure: ATHERECTOMY;  Surgeon: Lorretta Harp, MD;  Location: [facility] CATH LAB;  Service: Cardiovascular;  Laterality: N/A;  . CARDIAC CATHETERIZATION  11/19/10   normal coronaries, mod AS, 75% l RAS  . CARDIOVERSION  04/03/2011   Procedure: CARDIOVERSION;  Surgeon: Leonie Man, MD;  Location: [place];  Service: Cardiovascular;  Laterality: N/A;  . CATARACT  EXTRACTION W/ INTRAOCULAR LENS  IMPLANT, BILATERAL  ~ 2007  . CHOLECYSTECTOMY    . FEMUR IM NAIL  06/27/2011   Procedure: INTRAMEDULLARY (IM) NAIL FEMORAL;  Surgeon: Marin Shutter, MD;  Location: [facility];  Service: Orthopedics;  Laterality: Right;  . FEMUR IM NAIL Left 12/14/2013   Procedure: INTRAMEDULLARY (IM) NAIL FEMORAL;  Surgeon: Mauri Pole, MD;  Location: [facility] OR[facility];  Service: Orthopedics;  Laterality: Left;  . FRACTURE SURGERY    . LOWER EXTREMITY ANGIOGRAM  06/17/2011   diamondback orbital rotational and cutting balloon atherectomy of the prox R SFA  . LOWER EXTREMITY ANGIOGRAM Bilateral 06/17/2011   Procedure: LOWER EXTREMITY ANGIOGRAM;  Surgeon: Lorretta Harp, MD;  Location: [facility] CATH LAB;  Service: Cardiovascular;  Laterality: Bilateral;  . PERIPHERAL ARTERIAL STENT GRAFT     2006 left anf right illiac stents Dr Deon Pilling  . RENAL ARTERY STENT  2006   "I believe"  . REVERSE SHOULDER ARTHROPLASTY Left 12/31/2016   Procedure: LEFT REVERSE SHOULDER ARTHROPLASTY;  Surgeon: Nicholes Stairs, MD;  Location: [place];  Service: Orthopedics;  Laterality: Left;  . TONSILLECTOMY AND ADENOIDECTOMY     "when I was a kid"     reports that he has been smoking cigarettes.  He has a 112.50 pack-year smoking history. he has never used smokeless tobacco. He reports that he does not drink alcohol or use drugs.  Allergies  Allergen Reactions  . Aspirin Anaphylaxis, Shortness Of Breath and Rash    "broke out in white welts; red blotches neck and face; windpipe closing; ~ 1962"    Family History  Problem Relation Age of Onset  . Heart disease Mother   . Cancer Brother        colon  . Stroke Father    Unacceptable: Noncontributory, unremarkable, or negative. Acceptable: Family history reviewed and not pertinent (If you reviewed it)  Prior to Admission medications   Medication Sig Start Date End Date Taking? Authorizing Provider  acetaminophen (TYLENOL) 325 MG tablet Take 650 mg by mouth  every 6 (six) hours as needed for mild pain.   Yes [provider]  albuterol (ACCUNEB) 0.63 MG/3ML nebulizer solution Take 3 mLs by nebulization every 6 (six) hours as needed for wheezing or shortness of breath.   Yes [provider]  alendronate (FOSAMAX) 70 MG tablet Take 70 mg by mouth every 14 (fourteen) days. Take every other sunday 12/14/16  Yes [provider]  benazepril (LOTENSIN) 20 MG tablet Take 20 mg by mouth daily.   Yes [provider]  buPROPion (WELLBUTRIN XL) 150 MG 24 hr tablet Take 150 mg by mouth daily.    Yes [provider]  doxazosin (CARDURA) 4 MG tablet Take 4 mg by mouth daily.    Yes Collins, Gina L, PA-C  ferrous sulfate 325 (65 FE) MG EC tablet Take 1 tablet (325 mg total) by mouth 2 (two) times daily. 07/04/16 07/04/17 Yes Lavina Hamman, MD  finasteride (PROSCAR) 5 MG tablet Take 5 mg  by mouth at bedtime.    Yes [provider]  fluticasone (FLONASE) 50 MCG/ACT nasal spray Place 1 spray into both nostrils 2 (two) times daily.    Yes [provider]  furosemide (LASIX) 40 MG tablet Take 1 tablet (40 mg total) by mouth daily. 07/04/16  Yes Lavina Hamman, MD  glyBURIDE (DIABETA) 5 MG tablet Take 2.5-10 mg by mouth See admin instructions. 10mg  by mouth in the morning. 2.5mg  in the evening   Yes [provider]  Insulin Glargine (LANTUS) 100 UNIT/ML Solostar Pen Inject 10 Units into the skin daily.    Yes [provider]  loratadine (CLARITIN) 10 MG tablet Take 10 mg by mouth daily.   Yes [provider]  Melatonin 5 MG TABS Take 5 mg by mouth at bedtime.   Yes [provider]  metoprolol succinate (TOPROL-XL) 50 MG 24 hr tablet Take 50 mg by mouth 2 (two) times daily. Take with or immediately following a meal.   Yes [provider]  polyethylene glycol (MIRALAX / GLYCOLAX) packet Take 17 g by mouth daily. 12/25/16  Yes Mariel Aloe, MD  potassium chloride SA  (K-DUR,KLOR-CON) 20 MEQ tablet Take 20 mEq by mouth daily.    Yes [provider]  simvastatin (ZOCOR) 20 MG tablet Take 20 mg by mouth every evening.   Yes [provider]  traMADol (ULTRAM) 50 MG tablet Take 1 tablet (50 mg total) by mouth every 12 (twelve) hours as needed for moderate pain. 03/13/17  Yes Arrien, Jimmy Picket, MD  Umeclidinium-Vilanterol [facility] ELLIPTA) 62.5-25 MCG/INH AEPB Inhale 1 puff into the lungs daily.   Yes [provider]  warfarin (COUMADIN) 2 MG tablet Take 1-2 mg by mouth See admin instructions. 1mg  by mouth once daily in the evening on Tuesday and Thursday. 2mg  by mouth once daily in the evening on Monday, Wednesday, Friday, Saturday, Sunday 12/15/16  Yes [provider]    Physical Exam: Vitals:   04/10/17 0420 04/10/17 0500 04/10/17 0600 04/10/17 0700  BP: (!) 163/86 (!) 174/67 (!) 177/73 (!) 192/88  Pulse: 60 65 63 75  Resp: 13 (!) 23 (!) 25 19  SpO2: 96% 98% 100% 100%    Constitutional: NAD, calm, comfortable Vitals:   04/10/17 0420 04/10/17 0500 04/10/17 0600 04/10/17 0700  BP: (!) 163/86 (!) 174/67 (!) 177/73 (!) 192/88  Pulse: 60 65 63 75  Resp: 13 (!) 23 (!) 25 19  SpO2: 96% 98% 100% 100%   Eyes: Anicteric sclera ENMT: Nasal cannula in place, hard of hearing,  Neck: normal, supple Respiratory: Mildly increased work of breathing, crackles throughout bilateral lung bases.  Cardiovascular: Irregularly irregular, 2 out of 6 murmur heard best at right upper sternal border, distant heart sounds Abdomen: no tenderness, no masses palpated. No hepatosplenomegaly. Bowel sounds positive.  Musculoskeletal: Trace lower extremity edema Skin: no rashes on visible skin Neurologic: Hard of hearing, grossly intact.  Psychiatric: Normal judgment and insight. Alert and oriented x 3.  Labs on Admission: I have personally reviewed following labs and imaging studies  CBC: Recent Labs  Lab 04/10/17 0251  WBC 7.0  HGB  7.6*  HCT 23.1*  MCV 91.7  PLT 482*   Basic Metabolic Panel: Recent Labs  Lab 04/10/17 0251  NA 143  K 4.5  CL 109  CO2 23  GLUCOSE 111*  BUN 55*  CREATININE 3.51*  CALCIUM 8.6*   GFR: CrCl cannot be calculated (Unknown ideal weight.). Liver Function Tests: No  results for input(s): AST, ALT, ALKPHOS, BILITOT, PROT, ALBUMIN in the last 168 hours. No results for input(s): LIPASE, AMYLASE in the last 168 hours. No results for input(s): AMMONIA in the last 168 hours. Coagulation Profile: Recent Labs  Lab 04/10/17 0402  INR 4.86*   Cardiac Enzymes: No results for input(s): CKTOTAL, CKMB, CKMBINDEX, TROPONINI in the last 168 hours. BNP (last 3 results) No results for input(s): PROBNP in the last 8760 hours. HbA1C: No results for input(s): HGBA1C in the last 72 hours. CBG: No results for input(s): GLUCAP in the last 168 hours. Lipid Profile: No results for input(s): CHOL, HDL, LDLCALC, TRIG, CHOLHDL, LDLDIRECT in the last 72 hours. Thyroid Function Tests: No results for input(s): TSH, T4TOTAL, FREET4, T3FREE, THYROIDAB in the last 72 hours. Anemia Panel: No results for input(s): VITAMINB12, FOLATE, FERRITIN, TIBC, IRON, RETICCTPCT in the last 72 hours. Urine analysis:    Component Value Date/Time   COLORURINE YELLOW 04/10/2017 0528   APPEARANCEUR HAZY (A) 04/10/2017 0528   LABSPEC 1.011 04/10/2017 0528   PHURINE 5.0 04/10/2017 0528   GLUCOSEU NEGATIVE 04/10/2017 0528   HGBUR LARGE (A) 04/10/2017 0528   BILIRUBINUR NEGATIVE 04/10/2017 0528   KETONESUR NEGATIVE 04/10/2017 0528   PROTEINUR 100 (A) 04/10/2017 0528   UROBILINOGEN 1.0 12/13/2013 1556   NITRITE NEGATIVE 04/10/2017 0528   LEUKOCYTESUR TRACE (A) 04/10/2017 0528    Radiological Exams on Admission: Dg Chest 2 View  Result Date: 04/10/2017 CLINICAL DATA:  Shortness of breath. EXAM: CHEST  2 VIEW COMPARISON:  Radiograph 03/10/2017 FINDINGS: Post median sternotomy with prosthetic aortic valve. Prior left  atrial clipping. Unchanged cardiomegaly. Progressive pulmonary edema from prior exam. Increased bilateral pleural effusions. No pneumothorax. Left shoulder surgical hardware is partially included. IMPRESSION: Progressive pulmonary edema and increased bilateral pleural effusions consistent with worsening CHF. Electronically Signed   By: Jeb Levering M.D.   On: 04/10/2017 03:21    EKG: Independently reviewed.  Unchanged from prior, atrial fibrillation  Assessment/Plan Active Problems:   Essential hypertension   PVD, Rt SFA PTA/HSRA 06/17/11   AS (aortic stenosis)- s/p tissue AVR 01/07/11   Thrombocytopenia (HCC)   Chronic atrial fibrillation (HCC)   COPD (chronic obstructive pulmonary disease) (HCC)   Acute on chronic diastolic (congestive) heart failure (HCC)   Chronic renal insufficiency, stage IV (severe) (HCC)   Diabetes mellitus type 2, uncontrolled (HCC)   Acute blood loss anemia   GI bleed   AKI (acute kidney injury) ([place])    ##) Acute on chronic diastolic congestive heart failure: Patient appears to be volume overloaded, chest x-ray is consistent with this.  His exam is fairly unremarkable and only shows mild volume overload.  Suspect that this could be exacerbated by his anemia causing possibly high output heart failure. - Continue furosemide 40 mg IV twice daily, hold home oral furosemide -Weight daily, strict ins and outs, 1.5 L fluid restriction, 2 g sodium restricted diet  ##) Acute lower GI bleed causing acute on chronic anemia: Unclear etiology however on chart review patient has a lot of gastric ulcers.  He denies it however.  His GI bleeding would be most consistent with this and he has not been evaluated with an endoscopy due to his multiple medical comorbidities. -Continue pantoprazole IV 40 mg twice daily -GI consult -Hold anticoagulants  -transfuse 1 unit packed red blood cells and give 20 mg IV furosemide Exline-her liquid diet pending GI  recommendations -Continue ferrous sulfate 325 twice daily  -hemoglobin and hematocrit every 4 hours  ##)  Acute on chronic kidney injury: Suspect that this is likely type I cardiorenal syndrome in the setting of fluid overload and renal venous congestion.  He does not appear to be volume down despite his diarrhea. -Hold home benazepril -We will diuresis and see how creatinine responds  ##) Elevated INR: -Hold warfarin in setting of elevated INR and GI bleed  ##) COPD:\ -Continue long-acting muscarinic/long-acting beta agonist -Continue as needed albuterol  ##) T2DM:  -continue glargine 10 units at 10 AM - Sliding scale insulin, before meals at bedtime -Hold home glyburide - Will transition to low-carb diet once GI makes decision to intervene or not  ##) Proximal atrial fibrillation: -Continue metoprolol succinate 50 mg twice a day  -hold warfarin  ##) Benign prostatic hypertrophy:-Continue finasteride 5 mg nightly -Continue doxazosin 4 mg daily  ##) hypertension: -Continue metoprolol, hold benazepril in setting of AK I  ##) Hyperlipidemia: -Continue simvastatin 20 mg daily  ##) Psych: -Continue bupropion 150 mg daily -Continue as needed tramadol 50 mg twice a day  ##) Osteoporosis: - Continue alendronate 70 mg q. 2 weeks  Fluids: Restricted Electrolytes: Monitor and supplement Nutrition: Clear liquid diet, will transition to low carb 2 g sodium restricted heart healthy diet  Disposition: Pending improved respiratory status and stabilization of hemoglobin  Reflexes: SCDs  Full code   Cristy Folks MD [facility]  If 7PM-7AM, please contact night-coverage [email or web address] Password [facility]  04/10/2017, 8:05 AM

## 2017-04-10 NOTE — ED Triage Notes (Signed)
Patient arrives by [facility] with complaints SOB-EMS noted crackles in lung fields-patient from [facility]. EMS administered NTG 0.4 mg SL x2. O2 initiated 4l/[place] atrial fib

## 2017-04-11 ENCOUNTER — Inpatient Hospital Stay (HOSPITAL_COMMUNITY): Payer: PPO

## 2017-04-11 LAB — PROTIME-INR
INR: 4.9
Prothrombin Time: 45.4 s — ABNORMAL HIGH (ref 11.4–15.2)

## 2017-04-11 LAB — COMPREHENSIVE METABOLIC PANEL WITH GFR
AST: 13 U/L — ABNORMAL LOW (ref 15–41)
Albumin: 2.8 g/dL — ABNORMAL LOW (ref 3.5–5.0)
BUN: 52 mg/dL — ABNORMAL HIGH (ref 6–20)
Calcium: 8.4 mg/dL — ABNORMAL LOW (ref 8.9–10.3)
Glucose, Bld: 59 mg/dL — ABNORMAL LOW (ref 65–99)
Total Bilirubin: 1 mg/dL (ref 0.3–1.2)
Total Protein: 6.3 g/dL — ABNORMAL LOW (ref 6.5–8.1)

## 2017-04-11 LAB — GLUCOSE, CAPILLARY
Glucose-Capillary: 59 mg/dL — ABNORMAL LOW (ref 65–99)
Glucose-Capillary: 75 mg/dL (ref 65–99)
Glucose-Capillary: 153 mg/dL — ABNORMAL HIGH (ref 65–99)
Glucose-Capillary: 182 mg/dL — ABNORMAL HIGH (ref 65–99)
Glucose-Capillary: 193 mg/dL — ABNORMAL HIGH (ref 65–99)

## 2017-04-11 LAB — COMPREHENSIVE METABOLIC PANEL
ALT: 13 U/L — ABNORMAL LOW (ref 17–63)
Alkaline Phosphatase: 85 U/L (ref 38–126)
Anion gap: 11 (ref 5–15)
CO2: 23 mmol/L (ref 22–32)
Chloride: 107 mmol/L (ref 101–111)
Creatinine, Ser: 3.57 mg/dL — ABNORMAL HIGH (ref 0.61–1.24)
GFR calc Af Amer: 16 mL/min — ABNORMAL LOW (ref >60)
GFR calc non Af Amer: 13 mL/min — ABNORMAL LOW (ref >60)
Potassium: 3.9 mmol/L (ref 3.5–5.1)
Sodium: 141 mmol/L (ref 135–145)

## 2017-04-11 LAB — HEMOGLOBIN AND HEMATOCRIT, BLOOD
HCT: 25.3 % — ABNORMAL LOW (ref 39.0–52.0)
HCT: 27.3 % — ABNORMAL LOW (ref 39.0–52.0)
HCT: 29 % — ABNORMAL LOW (ref 39.0–52.0)
Hemoglobin: 8.5 g/dL — ABNORMAL LOW (ref 13.0–17.0)
Hemoglobin: 9.2 g/dL — ABNORMAL LOW (ref 13.0–17.0)
Hemoglobin: 9.6 g/dL — ABNORMAL LOW (ref 13.0–17.0)

## 2017-04-11 LAB — CBC
HCT: 26.9 % — ABNORMAL LOW (ref 39.0–52.0)
Hemoglobin: 9 g/dL — ABNORMAL LOW (ref 13.0–17.0)
MCH: 30.4 pg (ref 26.0–34.0)
MCHC: 33.5 g/dL (ref 30.0–36.0)
MCV: 90.9 fL (ref 78.0–100.0)
Platelets: 102 10*3/uL — ABNORMAL LOW (ref 150–400)
RBC: 2.96 MIL/uL — ABNORMAL LOW (ref 4.22–5.81)
RDW: 16.6 % — ABNORMAL HIGH (ref 11.5–15.5)
WBC: 7 10*3/uL (ref 4.0–10.5)

## 2017-04-11 MED ORDER — INSULIN ASPART 100 UNIT/ML ~~LOC~~ SOLN
0.0000 [IU] | Freq: Three times a day (TID) | SUBCUTANEOUS | Status: DC
  Administered 2017-04-11 – 2017-04-12 (×3): 2 [IU] via SUBCUTANEOUS
  Administered 2017-04-12 (×2): 1 [IU] via SUBCUTANEOUS
  Administered 2017-04-13 – 2017-04-14 (×3): 2 [IU] via SUBCUTANEOUS
  Administered 2017-04-14 – 2017-04-15 (×3): 1 [IU] via SUBCUTANEOUS
  Administered 2017-04-15: 2 [IU] via SUBCUTANEOUS
  Administered 2017-04-15: 1 [IU] via SUBCUTANEOUS
  Administered 2017-04-16: 2 [IU] via SUBCUTANEOUS
  Administered 2017-04-16: 1 [IU] via SUBCUTANEOUS
  Administered 2017-04-16: 2 [IU] via SUBCUTANEOUS
  Administered 2017-04-17 – 2017-04-18 (×2): 3 [IU] via SUBCUTANEOUS
  Administered 2017-04-18 – 2017-04-19 (×2): 1 [IU] via SUBCUTANEOUS
  Administered 2017-04-19 – 2017-04-20 (×2): 2 [IU] via SUBCUTANEOUS
  Administered 2017-04-20: 1 [IU] via SUBCUTANEOUS
  Administered 2017-04-20: 2 [IU] via SUBCUTANEOUS
  Administered 2017-04-21 – 2017-04-22 (×2): 1 [IU] via SUBCUTANEOUS
  Administered 2017-04-22 (×2): 2 [IU] via SUBCUTANEOUS
  Administered 2017-04-23 – 2017-04-25 (×3): 1 [IU] via SUBCUTANEOUS
  Administered 2017-04-26: 2 [IU] via SUBCUTANEOUS
  Administered 2017-04-26: 1 [IU] via SUBCUTANEOUS

## 2017-04-11 MED ORDER — PANTOPRAZOLE SODIUM 40 MG PO TBEC
40.0000 mg | DELAYED_RELEASE_TABLET | Freq: Two times a day (BID) | ORAL | Status: DC
  Administered 2017-04-11 – 2017-04-12 (×2): 40 mg via ORAL
  Filled 2017-04-11 (×2): qty 1

## 2017-04-11 MED ORDER — FUROSEMIDE 40 MG PO TABS: 40.0000 mg | ORAL_TABLET | Freq: Every day | ORAL | Status: DC

## 2017-04-11 MED ORDER — INSULIN ASPART 100 UNIT/ML ~~LOC~~ SOLN
0.0000 [IU] | Freq: Every day | SUBCUTANEOUS | Status: DC
  Administered 2017-04-13: 2 [IU] via SUBCUTANEOUS
  Administered 2017-04-14: 3 [IU] via SUBCUTANEOUS
  Administered 2017-04-17: 2 [IU] via SUBCUTANEOUS
  Administered 2017-04-18: 3 [IU] via SUBCUTANEOUS
  Administered 2017-04-21: 2 [IU] via SUBCUTANEOUS

## 2017-04-11 NOTE — Progress Notes (Signed)
CRITICAL VALUE ALERT  Critical Value:  INR 4.90  Date & Time Notied:  04/11/2017 0800  Provider Notified: Dr. Florene Glen  Orders Received/Actions taken:

## 2017-04-11 NOTE — Progress Notes (Signed)
SATURATION QUALIFICATIONS: (This note is used to comply with regulatory documentation for home oxygen)  Patient Saturations on Room Air at Rest = 95%  Patient Saturations on Room Air while Ambulating = 85%  Patient Saturations on 1 Liters of oxygen while Ambulating = 93%  Please briefly explain why patient needs home oxygen:

## 2017-04-11 NOTE — Progress Notes (Signed)
PROGRESS NOTE    CHE Ronald [name]  Ronald [name]:[id number] DOB: 26-May-1922 DOA: 04/10/2017 PCP: [name], MD   Brief Narrative:  Per HPI Ronald [name] is a 84 y.o. male with medical history significant of peripheral vascular disease status post vent placement, renal artery stenosis that is post angioplasty with stent and rotational atherectomy in 3875, diastolic heart failure (last echo 03/12/2017), stage III sent chronic kidney disease, hypertension, paroxysmal atrial fibrillation on warfarin, COPD, aortic stenosis status post bovine AV valve replacement in 01/07/2011, hyperlipidemia who comes in with increasing shortness of breath.  Patient is an extremely poor historian.  Patient reports that approximately 2 days ago he began to develop worsening shortness of breath at night.  He cannot tell me if this was when he was lying flat.  He does report some lower extremity edema.  He denies any cough, congestion, rhinorrhea, sputum, fevers, chest pain, abdominal pain.  He also reported several episodes of "dark tarry diarrhea during this time".  He denies any hematemesis.  He is last had colonoscopies and endoscopies in 2008 and 2005 respectively.  He does have a history of prior GI bleeding and [facility]  Assessment & Plan:   Active Problems:   Essential hypertension   PVD, Rt SFA PTA/HSRA 06/17/11   AS (aortic stenosis)- s/p tissue AVR 01/07/11   Thrombocytopenia (HCC)   Chronic atrial fibrillation (HCC)   COPD (chronic obstructive pulmonary disease) (HCC)   Acute on chronic diastolic (congestive) heart failure (HCC)   Chronic renal insufficiency, stage IV (severe) (HCC)   Diabetes mellitus type 2, uncontrolled (HCC)   Acute blood loss anemia   GI bleed   AKI (acute kidney injury) ([place])   ##) Acute on chronic diastolic congestive heart failure: Patient appears to be volume overloaded, chest x-ray is consistent with pulmonary edema and increased bilateral pleural effusions.   This AM his exam notable mostly for some mild sacral edema.   Echo 03/12/17 with normal EF, mod pulm hypertension (not sufficient to allow eval of LV diastolic dysfunction) - Repeat CXR today - Continue furosemide 40 mg IV twice daily and plan to restart home lasix tomorrow -Weight daily, strict ins and outs, 1.5 L fluid restriction, 2 g sodium restricted diet - net negative so far this hospitalization, not sure accuracy of weights, but improving resp status  Filed Weights   04/10/17 2005 04/11/17 0500  Weight: 77.7 kg (171 lb 6.4 oz) 79.6 kg (175 lb 6.4 oz)   ##) Acute lower GI bleed causing acute on chronic anemia: Poor historian.  Notes "dark stools" for a while, but with stable H/H not sure how significant his bleeding is.  Per GI at this point in time, will continue to monitor.  No plans for EGD at the present.  Will allow INR to drift down.  Will plan on continuing anticoagulation given his risk factors below without evidence of significant bleeding. - H/H stable today after transfusion - 2/14 - 1 unit pRBC -Continue pantoprazole IV 40 mg twice daily -GI consult appreciate recs -Continue ferrous sulfate 325 twice daily  -hemoglobin and hematocrit every 4 hours  ##) Acute on chronic kidney injury:  Suspect that this is likely 2/2 hypervolemia with heart failure above.  Creatinine is relatively stable today, but he's had relatively good output.   -Hold home benazepril -We will continue diuresis today - UA with RBC's and protein, will repeat and add on culture - follow renal US  ##) Elevated INR: -Hold warfarin  in setting of elevated INR and GI bleed - plan to allow this to drift down  ##) COPD: -Continue long-acting muscarinic/long-acting beta agonist -Continue as needed albuterol  ##) T2DM:  - hold lantus - Sliding scale insulin, before meals at bedtime -Hold home glyburide - Will transition to low-carb diet once GI makes decision to intervene or not  ##) Proximal  atrial fibrillation  Hx bioprosthetic AVR: -Continue metoprolol succinate 50 mg twice a day  -hold warfarin  ##) Benign prostatic hypertrophy: -Continue finasteride 5 mg nightly -Continue doxazosin 4 mg daily  ##) hypertension: -Continue metoprolol, hold benazepril in setting of AK I  ##) Hyperlipidemia: -Continue simvastatin 20 mg daily  ##) Psych: -Continue bupropion 150 mg daily -Continue as needed tramadol 50 mg twice a day  ##) Osteoporosis: - Continue alendronate 70 mg q. 2 weeks (per GI, consider holding this at d/c)  DVT prophylaxis: SCD Code Status: full  Family Communication: planning to call son Disposition Plan: pending improvement in resp status and follow H/H   Consultants:   GI  Procedures:   none  Antimicrobials:  Anti-infectives (From admission, onward)   None        Subjective: Breathing feels better.   Presented due to SOB.   Objective: Vitals:   04/11/17 0610 04/11/17 0905 04/11/17 0912 04/11/17 1225  BP: (!) 177/68   (!) 160/67  Pulse: (!) 57   64  Resp: 20   18  Temp: 97.7 F (36.5 C)   97.8 F (36.6 C)  TempSrc: Oral   Oral  SpO2: 98% 99% 100% 100%  Weight:      Height:        Intake/Output Summary (Last 24 hours) at 04/11/2017 1311 Last data filed at 04/11/2017 1226 Gross per 24 hour  Intake 240 ml  Output 1675 ml  Net -1435 ml   Filed Weights   04/10/17 2005 04/11/17 0500  Weight: 77.7 kg (171 lb 6.4 oz) 79.6 kg (175 lb 6.4 oz)    Examination:  General exam: Appears calm and comfortable  Respiratory system: Clear to auscultation. Respiratory effort normal. Cardiovascular system: S1 & S2 heard, RRR. No JVD, murmurs, rubs, gallops or clicks. No pedal edema. Gastrointestinal system: Abdomen is nondistended, soft and nontender. No organomegaly or masses felt. Normal bowel sounds heard. Central nervous system: Alert and oriented. No focal neurological deficits. Extremities: No LEE, trace sacral edema Skin:  diffuse bruising Psychiatry: Judgement and insight appear normal. Mood & affect appropriate.     Data Reviewed: I have personally reviewed following labs and imaging studies  CBC: Recent Labs  Lab 04/10/17 0251 04/10/17 1234 04/10/17 2034 04/11/17 0007 04/11/17 0405 04/11/17 0818  WBC 7.0  --   --   --  7.0  --   HGB 7.6* 8.1* 9.0* 8.5* 9.0*  9.2* 9.6*  HCT 23.1* 24.7* 27.9* 25.3* 26.9*  27.3* 29.0*  MCV 91.7  --   --   --  90.9  --   PLT 126*  --   --   --  102*  --    Basic Metabolic Panel: Recent Labs  Lab 04/10/17 0251 04/11/17 0405  NA 143 141  K 4.5 3.9  CL 109 107  CO2 23 23  GLUCOSE 111* 59*  BUN 55* 52*  CREATININE 3.51* 3.57*  CALCIUM 8.6* 8.4*   GFR: Estimated Creatinine Clearance: 14.2 mL/min (A) (by C-G formula based on SCr of 3.57 mg/dL (H)). Liver Function Tests: Recent Labs  Lab 04/11/17 0405  AST 13*  ALT 13*  ALKPHOS 85  BILITOT 1.0  PROT 6.3*  ALBUMIN 2.8*   No results for input(s): LIPASE, AMYLASE in the last 168 hours. No results for input(s): AMMONIA in the last 168 hours. Coagulation Profile: Recent Labs  Lab 04/10/17 0402 04/11/17 0405  INR 4.86* 4.90*   Cardiac Enzymes: No results for input(s): CKTOTAL, CKMB, CKMBINDEX, TROPONINI in the last 168 hours. BNP (last 3 results) No results for input(s): PROBNP in the last 8760 hours. HbA1C: No results for input(s): HGBA1C in the last 72 hours. CBG: Recent Labs  Lab 04/10/17 1741 04/10/17 2026 04/11/17 0752 04/11/17 0831 04/11/17 1118  GLUCAP 198* 161* 59* 75 153*   Lipid Profile: No results for input(s): CHOL, HDL, LDLCALC, TRIG, CHOLHDL, LDLDIRECT in the last 72 hours. Thyroid Function Tests: No results for input(s): TSH, T4TOTAL, FREET4, T3FREE, THYROIDAB in the last 72 hours. Anemia Panel: No results for input(s): VITAMINB12, FOLATE, FERRITIN, TIBC, IRON, RETICCTPCT in the last 72 hours. Sepsis Labs: No results for input(s): PROCALCITON, LATICACIDVEN in the last  168 hours.  Recent Results (from the past 240 hour(s))  MRSA PCR Screening     Status: None   Collection Time: 04/10/17  2:43 PM  Result Value Ref Range Status   MRSA by PCR NEGATIVE NEGATIVE Final    Comment:        The GeneXpert MRSA Assay (FDA approved for NASAL specimens only), is one component of a comprehensive MRSA colonization surveillance program. It is not intended to diagnose MRSA infection nor to guide or monitor treatment for MRSA infections. Performed at [facility], [place] [address] [place],  [address]          Radiology Studies: Dg Chest 2 View  Result Date: 04/10/2017 CLINICAL DATA:  Shortness of breath. EXAM: CHEST  2 VIEW COMPARISON:  Radiograph 03/10/2017 FINDINGS: Post median sternotomy with prosthetic aortic valve. Prior left atrial clipping. Unchanged cardiomegaly. Progressive pulmonary edema from prior exam. Increased bilateral pleural effusions. No pneumothorax. Left shoulder surgical hardware is partially included. IMPRESSION: Progressive pulmonary edema and increased bilateral pleural effusions consistent with worsening CHF. Electronically Signed   By: Jeb Levering M.D.   On: 04/10/2017 03:21   Dg Chest Port 1 View  Result Date: 04/11/2017 CLINICAL DATA:  Shortness of Breath EXAM: PORTABLE CHEST 1 VIEW COMPARISON:  April 10, 2017 FINDINGS: There is cardiomegaly with pulmonary venous hypertension. There are pleural effusions bilaterally with bibasilar atelectasis. No airspace consolidation or pulmonary edema evident. There is a prosthetic aortic valve present. There is a left atrial appendage clamp. No adenopathy. There is postoperative total shoulder replacement on the left. IMPRESSION: Pulmonary vascular congestion with bibasilar atelectasis and small pleural effusions. No frank edema or consolidation evident. Areas of postoperative change noted. Electronically Signed   By: Lowella Grip III M.D.   On: 04/11/2017 11:19          Scheduled Meds: . buPROPion  150 mg Oral Daily  . doxazosin  4 mg Oral Daily  . ferrous sulfate  325 mg Oral BID  . finasteride  5 mg Oral QHS  . fluticasone  1 spray Each Nare BID  . furosemide  40 mg Intravenous Q12H  . insulin aspart  0-5 Units Subcutaneous QHS  . insulin aspart  0-9 Units Subcutaneous TID WC  . loratadine  10 mg Oral Daily  . Melatonin  5 mg Oral QHS  . metoprolol succinate  50 mg Oral BID  . pantoprazole (PROTONIX)  IV  40 mg Intravenous Q12H  . simvastatin  20 mg Oral QPM  . sodium chloride flush  3 mL Intravenous Q12H  . umeclidinium-vilanterol  1 puff Inhalation Daily   Continuous Infusions: . sodium chloride       LOS: 1 day    Time spent: over 30 min    Fayrene Helper, MD [facility] Pager [phone]  If 7PM-7AM, please contact night-coverage [email or web address] Password [email or web address] 04/11/2017, 1:11 PM

## 2017-04-11 NOTE — Evaluation (Signed)
Physical Therapy Evaluation Patient Details Name: Ronald [name] MRN: [id number] DOB: [birth year] Today's Date: 04/11/2017   History of Present Illness  Pt is a 84 y.o. male who comes in with increasing shortness of breath, Hx significant for peripheral vascular disease status post vent placement, renal artery stenosis that is post angioplasty with stent and rotational atherectomy in 7106, diastolic heart failure (last echo 03/12/2017), stage III sent chronic kidney disease, hypertension, paroxysmal atrial fibrillation on warfarin, COPD, aortic stenosis status post bovine AV valve replacement in 01/07/2011, hyperlipidemia.  Clinical Impression  Pt admitted with above diagnosis. Pt currently with functional limitations due to the deficits listed below (see PT Problem List).  Pt will benefit from skilled PT to increase their independence and safety with mobility to allow discharge to the venue listed below.  Pt requiring at least min assist for mobility and fatigues very quickly.  SpO2 also dropped to 85% on room air during ambulation.  Recommend SNF prior to return to ALF (unless ALF able to provide current assist level).     Follow Up Recommendations Supervision/Assistance - 24 hour;Other (comment);SNF(if returns to assisted living, may need increased assistance)    Equipment Recommendations  Rolling walker with 5" wheels    Recommendations for Other Services       Precautions / Restrictions Precautions Precautions: Fall Precaution Comments: monitor sats Restrictions Weight Bearing Restrictions: No      Mobility  Bed Mobility Overal bed mobility: Needs Assistance       Supine to sit: Min guard     General bed mobility comments: offer pt HHA, used hand to pull up; min/guard for safety  Transfers Overall transfer level: Needs assistance Equipment used: Rolling walker (2 wheeled) Transfers: Sit to/from Stand Sit to Stand: Min assist         General transfer comment: slight  assist to rise and control descent, cues for hand placement  Ambulation/Gait Ambulation/Gait assistance: Min assist Ambulation Distance (Feet): 40 Feet Assistive device: Rolling walker (2 wheeled) Gait Pattern/deviations: Step-through pattern;Decreased dorsiflexion - right     General Gait Details: more assist required with increased distance and fatigue, increased R hip/knee flexion and decreased DF observed, SpO2 85% on room air upon returning to room and pt reporting moderate dyspnea  Stairs            Wheelchair Mobility    Modified Rankin (Stroke Patients Only)       Balance Overall balance assessment: Needs assistance         Standing balance support: Bilateral upper extremity supported Standing balance-Leahy Scale: Poor                               Pertinent Vitals/Pain Pain Assessment: No/denies pain    Home Living Family/patient expects to be discharged to:: Skilled nursing facility Living Arrangements: Alone               Additional Comments: Per chart, pt from [facility].    Prior Function Level of Independence: Needs assistance   Gait / Transfers Assistance Needed: unreliable historian, however states no assist required for ADLs, ambulates with assistive device     Comments: Pt unreliable historian, no family available at this time to answer questions     Hand Dominance        Extremity/Trunk Assessment   Upper Extremity Assessment Upper Extremity Assessment: LUE deficits/detail;Generalized weakness LUE Deficits / Details: L reverse TSA, limited motion of  arm    Lower Extremity Assessment Lower Extremity Assessment: RLE deficits/detail;Generalized weakness RLE Deficits / Details: observed decreased R ankle DF during ambulation       Communication   Communication: HOH  Cognition Arousal/Alertness: Awake/alert Behavior During Therapy: WFL for tasks assessed/performed Overall Cognitive Status: No  family/caregiver present to determine baseline cognitive functioning                                 General Comments: initially saying he was in hospital because he didn't want federal anymore, only state, pt then able to answer he was at [facility], cognition seemed to improve once more awake and appears appropriate when speaking with MD (assessed pt while PT in room)      General Comments      Exercises     Assessment/Plan    PT Assessment Patient needs continued PT services  PT Problem List Decreased strength;Decreased mobility;Decreased range of motion;Decreased activity tolerance;Decreased balance;Decreased knowledge of use of DME;Cardiopulmonary status limiting activity       PT Treatment Interventions DME instruction;Therapeutic activities;Gait training;Therapeutic exercise;Functional mobility training;Patient/family education    PT Goals (Current goals can be found in the Care Plan section)  Acute Rehab PT Goals PT Goal Formulation: With patient Time For Goal Achievement: 04/25/17 Potential to Achieve Goals: Good    Frequency Min 3X/week   Barriers to discharge        Co-evaluation               AM-PAC PT "6 Clicks" Daily Activity  Outcome Measure Difficulty turning over in bed (including adjusting bedclothes, sheets and blankets)?: A Lot Difficulty moving from lying on back to sitting on the side of the bed? : A Lot Difficulty sitting down on and standing up from a chair with arms (e.g., wheelchair, bedside commode, etc,.)?: Unable Help needed moving to and from a bed to chair (including a wheelchair)?: A Lot Help needed walking in hospital room?: A Lot Help needed climbing 3-5 steps with a railing? : Total 6 Click Score: 10    End of Session Equipment Utilized During Treatment: Gait belt Activity Tolerance: Patient limited by fatigue Patient left: in chair;with chair alarm set;with call bell/phone within reach Nurse Communication: Mobility  status PT Visit Diagnosis: Muscle weakness (generalized) (M62.81);Difficulty in walking, not elsewhere classified (R26.2)    Time: 9244-6286 PT Time Calculation (min) (ACUTE ONLY): 23 min   Charges:   PT Evaluation $PT Eval Moderate Complexity: 1 Mod     PT G Codes:        Carmelia Bake, PT, DPT 04/11/2017 Pager: [phone]  York Ram E 04/11/2017, 1:23 PM

## 2017-04-11 NOTE — Progress Notes (Signed)
Progress Note   Assessment / Plan:   Assessment: 1.  Melena: Poor historian, melena over the past 6 months, hemoglobin mostly stable at 9.6 today, INR remains elevated at 4.9, most likely this is contributory 2.  Anemia 3.  CHF exacerbation  Plan: 1.  No acute changes in hemoglobin overnight.  No signs of GI bleeding with no bowel movements at all. 2.  INR has remained elevated off of warfarin.  Again could consider reversal with FFP/vitamin K if bleeding increases. 3.  We will increase patient's diet to heart healthy today as there are no immediate plans to scope 4.  Please await any further recommendations from Dr. Carlean Purl.  Thank you for your kind consultation, we will continue to follow.    LOS: 1 day  Levin Erp  04/11/2017, 9:25 AM  Pager # [phone]    [facility] Attending   I have taken an interval history, reviewed the chart and examined the patient. I agree with the Advanced Practitioner's note, impression and recommendations.   He appears stable. Not sure how much GI hemorrhage or bleeding he has had - seems like minimal based upon clinical course. Discussed w/ Dr. Florene Glen - will hold EGD in reserve if therapy needed. Watch INR go down Agree that maintaining anticoagulation seems sensible to reduce risk of stroke or other cardiovascular problem based upon what we know now.Key will be to avoid supratherapeutic INR  We are available if needed but are signing off  OK to advance diet  Gatha Mayer, MD, [facility] [phone] (pager) 04/11/2017 10:39 AM    Subjective  Chief Complaint: Melena, anemia and CHF exacerbation  This morning the patient is found sitting up in bed, his son is by his side.  He tells me that his breathing has improved overnight.  Per nursing he has had no bowel movements.  He is requesting an increase in diet.  No complaints.   Objective   Vital signs in last 24 hours: Temp:  [97.4 F (36.3 C)-98.5  F (36.9 C)] 97.7 F (36.5 C) (02/15 0610) Pulse Rate:  [52-122] 57 (02/15 0610) Resp:  [18-22] 20 (02/15 0610) BP: (155-177)/(68-88) 177/68 (02/15 0610) SpO2:  [98 %-100 %] 100 % (02/15 0912) Weight:  [171 lb 6.4 oz (77.7 kg)-175 lb 6.4 oz (79.6 kg)] 175 lb 6.4 oz (79.6 kg) (02/15 0500) Last BM Date: 04/09/17 General:    white male in NAD Heart:  Regular rate and rhythm; no murmurs Lungs: Respirations even and unlabored, lungs CTA bilaterally, on O2 via nasal cannula Abdomen:  Soft, nontender and nondistended. Normal bowel sounds. Extremities:  Without edema. Neurologic:  Alert and oriented,  grossly normal neurologically. Psych:  Cooperative. Normal mood and affect.  Intake/Output from previous day: 02/14 0701 - 02/15 0700 In: 240 [P.O.:240] Out: 1100 [Urine:1100] Intake/Output this shift: Total I/O In: -  Out: 200 [Urine:200]  Lab Results: Recent Labs    04/10/17 0251  04/11/17 0007 04/11/17 0405 04/11/17 0818  WBC 7.0  --   --  7.0  --   HGB 7.6*   < > 8.5* 9.0*  9.2* 9.6*  HCT 23.1*   < > 25.3* 26.9*  27.3* 29.0*  PLT 126*  --   --  102*  --    < > = values in this interval not displayed.   BMET Recent Labs    04/10/17 0251 04/11/17 0405  NA 143 141  K 4.5 3.9  CL 109 107  CO2 23 23  GLUCOSE 111* 59*  BUN 55* 52*  CREATININE 3.51* 3.57*  CALCIUM 8.6* 8.4*   LFT Recent Labs    04/11/17 0405  PROT 6.3*  ALBUMIN 2.8*  AST 13*  ALT 13*  ALKPHOS 85  BILITOT 1.0   PT/INR Recent Labs    04/10/17 0402 04/11/17 0405  LABPROT 45.0* 45.4*  INR 4.86* 4.90*    Studies/Results: Dg Chest 2 View  Result Date: 04/10/2017 CLINICAL DATA:  Shortness of breath. EXAM: CHEST  2 VIEW COMPARISON:  Radiograph 03/10/2017 FINDINGS: Post median sternotomy with prosthetic aortic valve. Prior left atrial clipping. Unchanged cardiomegaly. Progressive pulmonary edema from prior exam. Increased bilateral pleural effusions. No pneumothorax. Left shoulder surgical  hardware is partially included. IMPRESSION: Progressive pulmonary edema and increased bilateral pleural effusions consistent with worsening CHF. Electronically Signed   By: Jeb Levering M.D.   On: 04/10/2017 03:21

## 2017-04-11 NOTE — Progress Notes (Signed)
Hypoglycemic Event  CBG:59  Treatment: 15 GM carbohydrate snack  Symptoms: None  Follow-up CBG: Time:0830 CBG Result:75  Possible Reasons for Event: Unknown  Comments/MD notified:    Vandana Haman A

## 2017-04-12 ENCOUNTER — Encounter (HOSPITAL_COMMUNITY): Payer: Self-pay

## 2017-04-12 ENCOUNTER — Inpatient Hospital Stay (HOSPITAL_COMMUNITY): Payer: PPO

## 2017-04-12 LAB — VITAMIN B12: Vitamin B-12: 577 pg/mL (ref 180–914)

## 2017-04-12 LAB — CBC
HEMATOCRIT: 25.4 % — AB (ref 39.0–52.0)
HEMOGLOBIN: 8.5 g/dL — AB (ref 13.0–17.0)
MCH: 30.6 pg (ref 26.0–34.0)
MCHC: 33.5 g/dL (ref 30.0–36.0)
MCV: 91.4 fL (ref 78.0–100.0)
Platelets: 113 10*3/uL — ABNORMAL LOW (ref 150–400)
RBC: 2.78 MIL/uL — AB (ref 4.22–5.81)
RDW: 16.5 % — ABNORMAL HIGH (ref 11.5–15.5)
WBC: 7.1 10*3/uL (ref 4.0–10.5)

## 2017-04-12 LAB — GLUCOSE, CAPILLARY
Glucose-Capillary: 131 mg/dL — ABNORMAL HIGH (ref 65–99)
Glucose-Capillary: 151 mg/dL — ABNORMAL HIGH (ref 65–99)
Glucose-Capillary: 138 mg/dL — ABNORMAL HIGH (ref 65–99)

## 2017-04-12 LAB — PROTIME-INR
INR: 4.79
Prothrombin Time: 44.6 seconds — ABNORMAL HIGH (ref 11.4–15.2)

## 2017-04-12 LAB — BASIC METABOLIC PANEL
ANION GAP: 9 (ref 5–15)
BUN: 56 mg/dL — ABNORMAL HIGH (ref 6–20)
CHLORIDE: 106 mmol/L (ref 101–111)
CO2: 24 mmol/L (ref 22–32)
CREATININE: 3.91 mg/dL — AB (ref 0.61–1.24)
Calcium: 8.2 mg/dL — ABNORMAL LOW (ref 8.9–10.3)
GFR calc non Af Amer: 12 mL/min — ABNORMAL LOW (ref >60)
GFR, EST AFRICAN AMERICAN: 14 mL/min — AB (ref >60)
Glucose, Bld: 129 mg/dL — ABNORMAL HIGH (ref 65–99)
Potassium: 4.1 mmol/L (ref 3.5–5.1)
Sodium: 139 mmol/L (ref 135–145)

## 2017-04-12 LAB — IRON AND TIBC
IRON: 35 ug/dL — AB (ref 45–182)
SATURATION RATIOS: 22 % (ref 17.9–39.5)
TIBC: 162 ug/dL — AB (ref 250–450)
UIBC: 127 ug/dL

## 2017-04-12 LAB — FERRITIN: Ferritin: 77 ng/mL (ref 24–336)

## 2017-04-12 LAB — MAGNESIUM: MAGNESIUM: 2.4 mg/dL (ref 1.7–2.4)

## 2017-04-12 LAB — FOLATE: Folate: 10.3 ng/mL (ref >5.9)

## 2017-04-12 MED ORDER — FUROSEMIDE 10 MG/ML IJ SOLN
60.0000 mg | Freq: Two times a day (BID) | INTRAMUSCULAR | Status: DC
  Administered 2017-04-12: 60 mg via INTRAVENOUS
  Filled 2017-04-12: qty 6

## 2017-04-12 MED ORDER — PANTOPRAZOLE SODIUM 40 MG PO TBEC
40.0000 mg | DELAYED_RELEASE_TABLET | Freq: Every day | ORAL | Status: DC
  Administered 2017-04-13 – 2017-04-26 (×14): 40 mg via ORAL
  Filled 2017-04-12 (×15): qty 1

## 2017-04-12 MED ORDER — TORSEMIDE 20 MG PO TABS
40.0000 mg | ORAL_TABLET | Freq: Two times a day (BID) | ORAL | Status: DC
  Administered 2017-04-12: 40 mg via ORAL
  Filled 2017-04-12 (×2): qty 2

## 2017-04-12 NOTE — Progress Notes (Signed)
PROGRESS NOTE    Ronald [name]  FGH:[id number] DOB: [birth year] DOA: 04/10/2017 PCP: [name], MD   Brief Narrative:  Per HPI Ronald [name] is Ronald [name] 84 y.o. male with medical history significant of peripheral vascular disease status post vent placement, renal artery stenosis that is post angioplasty with stent and rotational atherectomy in 6789, diastolic heart failure (last echo 03/12/2017), stage III sent chronic kidney disease, hypertension, paroxysmal atrial fibrillation on warfarin, COPD, aortic stenosis status post bovine AV valve replacement in 01/07/2011, hyperlipidemia who comes in with increasing shortness of breath.  Patient is an extremely poor historian.  Patient reports that approximately 2 days ago he began to develop worsening shortness of breath at night.  He cannot tell me if this was when he was lying flat.  He does report some lower extremity edema.  He denies any cough, congestion, rhinorrhea, sputum, fevers, chest pain, abdominal pain.  He also reported several episodes of "dark tarry diarrhea during this time".  He denies any hematemesis.  He is last had colonoscopies and endoscopies in 2008 and 2005 respectively.  He does have Ronald [name] history of prior GI bleeding and [facility]  Assessment & Plan:   Active Problems:   Essential hypertension   PVD, Rt SFA PTA/HSRA 06/17/11   AS (aortic stenosis)- s/p tissue AVR 01/07/11   Thrombocytopenia (HCC)   Chronic atrial fibrillation (HCC)   COPD (chronic obstructive pulmonary disease) (HCC)   Acute on chronic diastolic (congestive) heart failure (HCC)   Chronic renal insufficiency, stage IV (severe) (HCC)   Diabetes mellitus type 2, uncontrolled (HCC)   Acute blood loss anemia   GI bleed   AKI (acute kidney injury) ([place])   ##) Acute on chronic diastolic congestive heart failure: Patient appears to be volume overloaded, chest x-ray is consistent with pulmonary edema and increased bilateral pleural effusions.     This AM with some dependent edema and trace LEE.  Echo 03/12/17 with normal EF, mod pulm hypertension (not sufficient to allow eval of LV diastolic dysfunction) - Repeat CXR today with "progression of CHF" worsening edema and bilateral effusions.  -With worsening renal function with diuresis, c/s nephrology, appreciate recommendations -Weight daily, strict ins and outs, 1.5 L fluid restriction, 2 g sodium restricted diet - net negative so far this hospitalization, not sure accuracy of weights, but improving resp status  Filed Weights   04/10/17 2005 04/11/17 0500 04/12/17 0442  Weight: 77.7 kg (171 lb 6.4 oz) 79.6 kg (175 lb 6.4 oz) 82.7 kg (182 lb 4.8 oz)   ##) Acute lower GI bleed causing acute on chronic anemia: Poor historian.  Notes "dark stools" for Ronald [name] while, but with stable H/H not sure how significant his bleeding is.  Per GI at this point in time, will continue to monitor.  No plans for EGD at the present.  Will allow INR to drift down.  Will plan on continuing anticoagulation given his risk factors below without evidence of significant bleeding. - H/H relatively stable since transfusion (Hb 8.5 today has been waxing/waning since transfusion on 2/14 - it was 8.5 at midnight on 2/15) - 2/14 - 1 unit pRBC -Continue pantoprazole PO daily  -GI consult appreciate recs -Continue ferrous sulfate 325 twice daily  -- daily CBC -- follow iron, ferritin, b12, folate  ##) Acute on chronic kidney injury:  Suspect that this is likely 2/2 hypervolemia with heart failure above. Creatinine worsened today with diuresis. - Appreciate nephrology recs  -diuresis - UA  with RBC's and protein, will repeat and add on culture - follow GN labs (ANA, ANCA, antiGBM, SPEP, C3, C4, Hep B/C) - follow renal US  ##) Elevated INR: -Hold warfarin in setting of elevated INR and GI bleed - plan to allow this to drift down - slowly downtrending  ##) COPD: -Continue long-acting muscarinic/long-acting beta  agonist -Continue as needed albuterol  ##) T2DM:  - hold lantus - Sliding scale insulin, before meals at bedtime -Hold home glyburide  ##) Proximal atrial fibrillation  Hx bioprosthetic AVR: -Continue metoprolol succinate 50 mg twice Ronald [name] day  -hold warfarin  ##) Benign prostatic hypertrophy: -Continue finasteride 5 mg nightly -Continue doxazosin 4 mg daily  ##) hypertension: -Continue metoprolol, hold benazepril in setting of AK I  ##) Hyperlipidemia: -Continue simvastatin 20 mg daily  ##) Psych: -Continue bupropion 150 mg daily -Continue as needed tramadol 50 mg twice Ronald [name] day  ##) Osteoporosis: - Continue alendronate 70 mg q. 2 weeks (per GI, consider holding this at d/c)  DVT prophylaxis: SCD Code Status: full  Family Communication: discussed with son 2/15 Disposition Plan: pending improvement in resp status and follow H/H   Consultants:   GI  Procedures:   none  Antimicrobials:  Anti-infectives (From admission, onward)   None        Subjective: Had worsening SOB last night.   This morning feels ok.   Had [name] breathing treatment.   Objective: Vitals:   04/11/17 1225 04/11/17 2026 04/12/17 0442 04/12/17 0614  BP: (!) 160/67 (!) 148/68 (!) 149/80   Pulse: 64 63 76   Resp: 18 18 18    Temp: 97.8 F (36.6 C) 97.9 F (36.6 C) 97.9 F (36.6 C)   TempSrc: Oral Oral Oral   SpO2: 100% 93% 93% 98%  Weight:   82.7 kg (182 lb 4.8 oz)   Height:        Intake/Output Summary (Last 24 hours) at 04/12/2017 0926 Last data filed at 04/12/2017 0815 Gross per 24 hour  Intake 240 ml  Output 1075 ml  Net -835 ml   Filed Weights   04/10/17 2005 04/11/17 0500 04/12/17 0442  Weight: 77.7 kg (171 lb 6.4 oz) 79.6 kg (175 lb 6.4 oz) 82.7 kg (182 lb 4.8 oz)    Examination:  General: No acute distress. Cardiovascular: Heart sounds show Ronald [name] regular rate, and rhythm. No gallops or rubs. No murmurs. + JVD. Lungs: Slightly labored.  Diffuse crackles.   Abdomen:  Soft, nontender, nondistended with normal active bowel sounds. No masses. No hepatosplenomegaly. Neurological: Alert and oriented 3. Moves all extremities 4. Cranial nerves II through XII grossly intact. Skin: Warm and dry. No rashes or lesions. Extremities: No clubbing or cyanosis. Trace LEE and dependent edema. Pedal pulses 2+. Psychiatric: Mood and affect are normal. Insight and judgment are appropriate.    Data Reviewed: I have personally reviewed following labs and imaging studies  CBC: Recent Labs  Lab 04/10/17 0251  04/10/17 2034 04/11/17 0007 04/11/17 0405 04/11/17 0818 04/12/17 0426  WBC 7.0  --   --   --  7.0  --  7.1  HGB 7.6*   < > 9.0* 8.5* 9.0*  9.2* 9.6* 8.5*  HCT 23.1*   < > 27.9* 25.3* 26.9*  27.3* 29.0* 25.4*  MCV 91.7  --   --   --  90.9  --  91.4  PLT 126*  --   --   --  102*  --  113*   < > = values  in this interval not displayed.   Basic Metabolic Panel: Recent Labs  Lab 04/10/17 0251 04/11/17 0405 04/12/17 0426  NA 143 141 139  K 4.5 3.9 4.1  CL 109 107 106  CO2 23 23 24   GLUCOSE 111* 59* 129*  BUN 55* 52* 56*  CREATININE 3.51* 3.57* 3.91*  CALCIUM 8.6* 8.4* 8.2*  MG  --   --  2.4   GFR: Estimated Creatinine Clearance: 13 mL/min (Marcele Kosta) (by C-G formula based on SCr of 3.91 mg/dL (H)). Liver Function Tests: Recent Labs  Lab 04/11/17 0405  AST 13*  ALT 13*  ALKPHOS 85  BILITOT 1.0  PROT 6.3*  ALBUMIN 2.8*   No results for input(s): LIPASE, AMYLASE in the last 168 hours. No results for input(s): AMMONIA in the last 168 hours. Coagulation Profile: Recent Labs  Lab 04/10/17 0402 04/11/17 0405  INR 4.86* 4.90*   Cardiac Enzymes: No results for input(s): CKTOTAL, CKMB, CKMBINDEX, TROPONINI in the last 168 hours. BNP (last 3 results) No results for input(s): PROBNP in the last 8760 hours. HbA1C: No results for input(s): HGBA1C in the last 72 hours. CBG: Recent Labs  Lab 04/11/17 0831 04/11/17 1118 04/11/17 1658 04/11/17 2259  04/12/17 0751  GLUCAP 75 153* 182* 193* 131*   Lipid Profile: No results for input(s): CHOL, HDL, LDLCALC, TRIG, CHOLHDL, LDLDIRECT in the last 72 hours. Thyroid Function Tests: No results for input(s): TSH, T4TOTAL, FREET4, T3FREE, THYROIDAB in the last 72 hours. Anemia Panel: No results for input(s): VITAMINB12, FOLATE, FERRITIN, TIBC, IRON, RETICCTPCT in the last 72 hours. Sepsis Labs: No results for input(s): PROCALCITON, LATICACIDVEN in the last 168 hours.  Recent Results (from the past 240 hour(s))  MRSA PCR Screening     Status: None   Collection Time: 04/10/17  2:43 PM  Result Value Ref Range Status   MRSA by PCR NEGATIVE NEGATIVE Final    Comment:        The GeneXpert MRSA Assay (FDA approved for NASAL specimens only), is one component of Lachelle Rissler comprehensive MRSA colonization surveillance program. It is not intended to diagnose MRSA infection nor to guide or monitor treatment for MRSA infections. Performed at [facility], [place] [address], [place], [place] [address]          Radiology Studies: Dg Chest Port 1 View  Result Date: 04/12/2017 CLINICAL DATA:  Hypoxia EXAM: PORTABLE CHEST 1 VIEW COMPARISON:  04/11/2017 FINDINGS: Diffuse bilateral airspace disease with mild interval progression. Bilateral pleural effusions also with mild progression. Cardiac enlargement. Aortic valve replacement. Left atrial clip. Bibasilar atelectasis. IMPRESSION: Progression of congestive heart failure. Worsening edema and bilateral effusions. Electronically Signed   By: Franchot Gallo M.D.   On: 04/12/2017 06:37   Dg Chest Port 1 View  Result Date: 04/11/2017 CLINICAL DATA:  Shortness of Breath EXAM: PORTABLE CHEST 1 VIEW COMPARISON:  April 10, 2017 FINDINGS: There is cardiomegaly with pulmonary venous hypertension. There are pleural effusions bilaterally with bibasilar atelectasis. No airspace consolidation or pulmonary edema evident. There is Danica Camarena prosthetic aortic  valve present. There is Tarini Carrier left atrial appendage clamp. No adenopathy. There is postoperative total shoulder replacement on the left. IMPRESSION: Pulmonary vascular congestion with bibasilar atelectasis and small pleural effusions. No frank edema or consolidation evident. Areas of postoperative change noted. Electronically Signed   By: Lowella Grip III M.D.   On: 04/11/2017 11:19        Scheduled Meds: . buPROPion  150 mg Oral Daily  . doxazosin  4 mg Oral Daily  . ferrous sulfate  325 mg Oral BID  . finasteride  5 mg Oral QHS  . fluticasone  1 spray Each Nare BID  . furosemide  60 mg Intravenous BID  . insulin aspart  0-5 Units Subcutaneous QHS  . insulin aspart  0-9 Units Subcutaneous TID WC  . loratadine  10 mg Oral Daily  . Melatonin  5 mg Oral QHS  . metoprolol succinate  50 mg Oral BID  . pantoprazole  40 mg Oral BID  . simvastatin  20 mg Oral QPM  . sodium chloride flush  3 mL Intravenous Q12H  . umeclidinium-vilanterol  1 puff Inhalation Daily   Continuous Infusions: . sodium chloride       LOS: 2 days    Time spent: over 30 min    Fayrene Helper, MD [facility] Pager [phone]  If 7PM-7AM, please contact night-coverage [email or web address] Password [facility] 04/12/2017, 9:26 AM

## 2017-04-12 NOTE — Consult Note (Signed)
Referring Provider: No ref. provider found Primary Care Physician:  [name], MD Primary Nephrologist:     Reason for Consultation:    HPI: This is a delightful 84 year old man that is from assisted living although according to Dani his daughter has had a decline function since November  He was admitted with congestive heart failure with what appears to be diastolic dysfunction and normal ejection fraction   He has bovine aortic valve and atrial fibrillation   His baseline renal function appears to be about 1.5 and was admitted with a creatinine of 3.5 As an outpatient he was taking an ACE benazepril according to my records. Renal ultrasound is unrevealing,  He appears to have been transfused [facility] 2/14 and does not appear to have received any Non steroidals   He does smoke 1 PPD  He has had a great diuresis with IV lasix although this is not reflected in weights and suspect that this is due to additional mechanical weight of the care items  He diureses 2 L and appears comfortable on oxygen\ Urinalysis showed some proteinuria and hematuria and this is concerning for possible intrinsic renal disease and possible glomerulonephritis  His blood pressure has been controlled with no evidence of hypotension     Past Medical History:  Diagnosis Date  . AS (aortic stenosis)    bovine aortic valve replacement 01/07/11  . Bladder cancer [facility])    Bladder Cancer local  . Blood transfusion    w/hip operation  . Cellulitis of left lower extremity   . Chronic atrial fibrillation ([place])   . Chronic diastolic congestive heart failure ([place])   . Claudication in peripheral vascular disease ([place]) 06/17/2011  . COPD (chronic obstructive pulmonary disease) ([place])   . Depression wife died 4 years ago.    Marland Kitchen History of stomach ulcers ~ 1951  . HTN (hypertension)   . Neuropathy due to secondary diabetes ([place])   . Peripheral vascular disease ([place]) very poor circulation legs and feet ... stents right and left  legs... done in dr j. Gwenlyn Found 's office.   . Pneumonia 07/25/11   left  . Recurrent upper respiratory infection (URI)    sinusitis  . Renal artery stenosis ([place]) 2006   renal artery stent  . S/P angioplasty with stent, diamond back rotational athrectomy Prox. Rt. SFA 06/17/2011 06/17/2011  . Shortness of breath   . Thrombocytopenia due to drugs    seen by Dr Inda Merlin plts [id number] no rx  . Type II diabetes mellitus ([place])     Past Surgical History:  Procedure Laterality Date  . ABDOMINAL ANGIOGRAM  06/17/2011   Procedure: ABDOMINAL ANGIOGRAM;  Surgeon: Lorretta Harp, MD;  Location: [facility] CATH LAB;  Service: Cardiovascular;;  . AORTIC VALVE REPLACEMENT  01/07/2011   Procedure: AORTIC VALVE REPLACEMENT (AVR);  Surgeon: Grace Isaac, MD;  Location: [place];  Service: Open Heart Surgery;  Laterality: N/A;; magna-ease bovine 58mm bioprosthesis  . ATHERECTOMY N/A 06/17/2011   Procedure: ATHERECTOMY;  Surgeon: Lorretta Harp, MD;  Location: [facility] CATH LAB;  Service: Cardiovascular;  Laterality: N/A;  . CARDIAC CATHETERIZATION  11/19/10   normal coronaries, mod AS, 75% l RAS  . CARDIOVERSION  04/03/2011   Procedure: CARDIOVERSION;  Surgeon: Leonie Man, MD;  Location: [place];  Service: Cardiovascular;  Laterality: N/A;  . CATARACT EXTRACTION W/ INTRAOCULAR LENS  IMPLANT, BILATERAL  ~ 2007  . CHOLECYSTECTOMY    . FEMUR IM NAIL  06/27/2011   Procedure: INTRAMEDULLARY (  IM) NAIL FEMORAL;  Surgeon: Marin Shutter, MD;  Location: [facility];  Service: Orthopedics;  Laterality: Right;  . FEMUR IM NAIL Left 12/14/2013   Procedure: INTRAMEDULLARY (IM) NAIL FEMORAL;  Surgeon: Mauri Pole, MD;  Location: [facility] OR[facility];  Service: Orthopedics;  Laterality: Left;  . FRACTURE SURGERY    . LOWER EXTREMITY ANGIOGRAM  06/17/2011   diamondback orbital rotational and cutting balloon atherectomy of the prox R SFA  . LOWER EXTREMITY ANGIOGRAM Bilateral 06/17/2011   Procedure: LOWER EXTREMITY ANGIOGRAM;  Surgeon: Lorretta Harp,  MD;  Location[facility] CATH LAB;  Service: Cardiovascular;  Laterality: Bilateral;  . PERIPHERAL ARTERIAL STENT GRAFT     2006 left anf right illiac stents Dr Deon Pilling  . RENAL ARTERY STENT  2006   "I believe"  . REVERSE SHOULDER ARTHROPLASTY Left 12/31/2016   Procedure: LEFT REVERSE SHOULDER ARTHROPLASTY;  Surgeon: Nicholes Stairs, MD;  Location: [place];  Service: Orthopedics;  Laterality: Left;  . TONSILLECTOMY AND ADENOIDECTOMY     "when I was a kid"    Prior to Admission medications   Medication Sig Start Date End Date Taking? Authorizing Provider  acetaminophen (TYLENOL) 325 MG tablet Take 650 mg by mouth every 6 (six) hours as needed for mild pain.   Yes [provider]  albuterol (ACCUNEB) 0.63 MG/3ML nebulizer solution Take 3 mLs by nebulization every 6 (six) hours as needed for wheezing or shortness of breath.   Yes [provider]  alendronate (FOSAMAX) 70 MG tablet Take 70 mg by mouth every 14 (fourteen) days. Take every other sunday 12/14/16  Yes [provider]  benazepril (LOTENSIN) 20 MG tablet Take 20 mg by mouth daily.   Yes [provider]  buPROPion (WELLBUTRIN XL) 150 MG 24 hr tablet Take 150 mg by mouth daily.    Yes [provider]  doxazosin (CARDURA) 4 MG tablet Take 4 mg by mouth daily.    Yes Collins, Gina L, PA-C  ferrous sulfate 325 (65 FE) MG EC tablet Take 1 tablet (325 mg total) by mouth 2 (two) times daily. 07/04/16 07/04/17 Yes Lavina Hamman, MD  finasteride (PROSCAR) 5 MG tablet Take 5 mg by mouth at bedtime.    Yes [provider]  fluticasone (FLONASE) 50 MCG/ACT nasal spray Place 1 spray into both nostrils 2 (two) times daily.    Yes [provider]  furosemide (LASIX) 40 MG tablet Take 1 tablet (40 mg total) by mouth daily. 07/04/16  Yes Lavina Hamman, MD  glyBURIDE (DIABETA) 5 MG tablet Take 2.5-10 mg by mouth See admin instructions. 10mg  by mouth in the morning. 2.5mg  in the evening   Yes  [provider]  Insulin Glargine (LANTUS) 100 UNIT/ML Solostar Pen Inject 10 Units into the skin daily.    Yes [provider]  loratadine (CLARITIN) 10 MG tablet Take 10 mg by mouth daily.   Yes [provider]  Melatonin 5 MG TABS Take 5 mg by mouth at bedtime.   Yes [provider]  metoprolol succinate (TOPROL-XL) 50 MG 24 hr tablet Take 50 mg by mouth 2 (two) times daily. Take with or immediately following a meal.   Yes [provider]  polyethylene glycol (MIRALAX / GLYCOLAX) packet Take 17 g by mouth daily. 12/25/16  Yes Mariel Aloe, MD  potassium chloride SA (K-DUR,KLOR-CON) 20 MEQ tablet Take 20 mEq by mouth daily.    Yes [provider]  simvastatin (ZOCOR) 20 MG tablet  Take 20 mg by mouth every evening.   Yes [provider]  traMADol (ULTRAM) 50 MG tablet Take 1 tablet (50 mg total) by mouth every 12 (twelve) hours as needed for moderate pain. 03/13/17  Yes Arrien, Jimmy Picket, MD  Umeclidinium-Vilanterol [facility] ELLIPTA) 62.5-25 MCG/INH AEPB Inhale 1 puff into the lungs daily.   Yes [provider]  warfarin (COUMADIN) 2 MG tablet Take 1-2 mg by mouth See admin instructions. 1mg  by mouth once daily in the evening on Tuesday and Thursday. 2mg  by mouth once daily in the evening on Monday, Wednesday, Friday, Saturday, Sunday 12/15/16  Yes [provider]    Current Facility-Administered Medications  Medication Dose Route Frequency Provider Last Rate Last Dose  . 0.9 %  sodium chloride infusion   Intravenous Once Fuller Plan A, MD      . acetaminophen (TYLENOL) tablet 650 mg  650 mg Oral Q6H PRN Purohit, Shrey C, MD       Or  . acetaminophen (TYLENOL) suppository 650 mg  650 mg Rectal Q6H PRN Purohit, Shrey C, MD      . albuterol (PROVENTIL) (2.5 MG/3ML) 0.083% nebulizer solution 3 mL  3 mL Nebulization Q6H PRN Purohit, Konrad Dolores, MD   3 mL at 04/12/17 0614  . buPROPion (WELLBUTRIN XL) 24 hr tablet  150 mg  150 mg Oral Daily Purohit, Shrey C, MD   150 mg at 04/12/17 1149  . doxazosin (CARDURA) tablet 4 mg  4 mg Oral Daily Purohit, Shrey C, MD   4 mg at 04/12/17 1149  . ferrous sulfate tablet 325 mg  325 mg Oral BID Purohit, Shrey C, MD   325 mg at 04/12/17 1148  . finasteride (PROSCAR) tablet 5 mg  5 mg Oral QHS Purohit, Shrey C, MD   5 mg at 04/11/17 2121  . fluticasone (FLONASE) 50 MCG/ACT nasal spray 1 spray  1 spray Each Nare BID Purohit, Shrey C, MD   1 spray at 04/12/17 1149  . furosemide (LASIX) injection 60 mg  60 mg Intravenous BID Elodia Florence., MD   60 mg at 04/12/17 1149  . insulin aspart (novoLOG) injection 0-5 Units  0-5 Units Subcutaneous QHS Elodia Florence., MD      . insulin aspart (novoLOG) injection 0-9 Units  0-9 Units Subcutaneous TID WC Elodia Florence., MD   2 Units at 04/12/17 1229  . loratadine (CLARITIN) tablet 10 mg  10 mg Oral Daily Purohit, Shrey C, MD   10 mg at 04/12/17 1148  . Melatonin TABS 5 mg  5 mg Oral QHS Purohit, Shrey C, MD   5 mg at 04/11/17 2123  . metoprolol succinate (TOPROL-XL) 24 hr tablet 50 mg  50 mg Oral BID Purohit, Shrey C, MD   50 mg at 04/12/17 1148  . ondansetron (ZOFRAN) tablet 4 mg  4 mg Oral Q6H PRN Purohit, Shrey C, MD       Or  . ondansetron (ZOFRAN) injection 4 mg  4 mg Intravenous Q6H PRN Purohit, Shrey C, MD      . pantoprazole (PROTONIX) EC tablet 40 mg  40 mg Oral BID Elodia Florence., MD   40 mg at 04/12/17 1148  . polyethylene glycol (MIRALAX / GLYCOLAX) packet 17 g  17 g Oral Daily PRN Purohit, Shrey C, MD      . simvastatin (ZOCOR) tablet 20 mg  20 mg Oral QPM Purohit, Shrey C, MD   20 mg at 04/11/17  1826  . sodium chloride flush (NS) 0.9 % injection 3 mL  3 mL Intravenous Q12H Purohit, Shrey C, MD   3 mL at 04/12/17 1150  . traMADol (ULTRAM) tablet 50 mg  50 mg Oral Q12H PRN Purohit, Shrey C, MD      . umeclidinium-vilanterol (ANORO ELLIPTA) 62.5-25 MCG/INH 1 puff  1 puff Inhalation Daily Purohit,  Shrey C, MD   1 puff at 04/12/17 1041    Allergies as of 04/10/2017 - Review Complete 04/10/2017  Allergen Reaction Noted  . Aspirin Anaphylaxis, Shortness Of Breath, and Rash 05/30/2008    Family History  Problem Relation Age of Onset  . Heart disease Mother   . Cancer Brother        colon  . Stroke Father     Social History   Socioeconomic History  . Marital status: Widowed    Spouse name: Not on file  . Number of children: 3  . Years of education: Not on file  . Highest education level: Not on file  Social Needs  . Financial resource strain: Not on file  . Food insecurity - worry: Not on file  . Food insecurity - inability: Not on file  . Transportation needs - medical: Not on file  . Transportation needs - non-medical: Not on file  Occupational History  . Occupation: Press photographer  Tobacco Use  . Smoking status: Current Every Day Smoker    Packs/day: 1.50    Years: 75.00    Pack years: 112.50    Types: Cigarettes  . Smokeless tobacco: Never Used  Substance and Sexual Activity  . Alcohol use: No    Alcohol/week: 1.2 oz    Types: 2 Cans of beer per week  . Drug use: No  . Sexual activity: No  Other Topics Concern  . Not on file  Social History Narrative  . Not on file    Review of Systems: Gen: Denies any fever, chills, sweats, anorexia,  + fatigue, + weakness, malaise, weight loss, and sleep disorder HEENT: No visual complaints, No history of Retinopathy. Normal external appearance No Epistaxis or Sore throat. No sinusitis.   CV: Denies chest pain, angina, palpitations, syncope, orthopnea, PND, peripheral edema, and claudication. Resp: Denies dyspnea at rest,+ dyspnea with exercise,+ cough, no sputum, wheezing, coughing up blood, and pleurisy. GI: Denies vomiting blood, jaundice, and fecal incontinence.   Denies dysphagia or odynophagia. GU : Denies urinary burning, blood in urine, urinary frequency, urinary hesitancy, nocturnal urination, and urinary incontinence.   No renal calculi. MS: Denies joint pain, limitation of movement, and swelling, stiffness, low back pain, extremity pain. Denies muscle weakness, cramps, atrophy.  No use of non steroidal antiinflammatory drugs. Derm: Denies rash, itching, dry skin, hives, moles, warts, or unhealing ulcers.  Psych: Denies depression, anxiety, memory loss, suicidal ideation, hallucinations, paranoia, and confusion. Heme: Denies bruising, bleeding, and enlarged lymph nodes. Neuro: No headache.  No diplopia. No dysarthria.  No dysphasia.  No history of CVA.  No Seizures. No paresthesias.  No weakness. Endocrine No DM.  No Thyroid disease.  No Adrenal disease.  Physical Exam: Vital signs in last 24 hours: Temp:  [97.9 F (36.6 C)] 97.9 F (36.6 C) (02/16 0442) Pulse Rate:  [63-76] 76 (02/16 0442) Resp:  [18] 18 (02/16 0442) BP: (148-149)/(68-80) 149/80 (02/16 0442) SpO2:  [93 %-98 %] 98 % (02/16 0614) FiO2 (%):  [98 %] 98 % (02/16 1041) Weight:  [182 lb 4.8 oz (82.7 kg)] 182 lb 4.8 oz (82.7  kg) (02/16 0442) Last BM Date: 04/12/17 General:   Alert,  Well-developed, well-nourished, pleasant and cooperative in NAD elderly man Head:  Normocephalic and atraumatic. Eyes:  Sclera clear, no icterus.   Conjunctiva pink. Ears:  Normal auditory acuity. Nose:  No deformity, discharge,  or lesions. Mouth:  No deformity or lesions, dentition normal. Neck:  Supple; no masses or thyromegaly. JVP not elevated Lungs:  Clear throughout to auscultation.   No wheezes, crackles, or rhonchi. No acute distress. Heart:  Regular rate and rhythm; systolic murmur Abdomen:  Soft, nontender and nondistended. No masses, hepatosplenomegaly or hernias noted. Normal bowel sounds, without guarding, and without rebound.   Msk:  Symmetrical without gross deformities. Normal posture. Pulses:  No carotid, renal, femoral bruits. DP and PT symmetrical and equal Extremities:  Without clubbing or edema. Neurologic:  Alert and  oriented x4;   grossly normal neurologically. Skin:  Intact without significant lesions or rashes. Cervical Nodes:  No significant cervical adenopathy. Psych:  Alert and cooperative. Normal mood and affect.  Intake/Output from previous day: 02/15 0701 - 02/16 0700 In: 240 [P.O.:240] Out: 1175 [Urine:1175] Intake/Output this shift: Total I/O In: 200 [P.O.:200] Out: 100 [Urine:100]  Lab Results: Recent Labs    04/10/17 0251  04/11/17 0405 04/11/17 0818 04/12/17 0426  WBC 7.0  --  7.0  --  7.1  HGB 7.6*   < > 9.0*  9.2* 9.6* 8.5*  HCT 23.1*   < > 26.9*  27.3* 29.0* 25.4*  PLT 126*  --  102*  --  113*   < > = values in this interval not displayed.   BMET Recent Labs    04/10/17 0251 04/11/17 0405 04/12/17 0426  NA 143 141 139  K 4.5 3.9 4.1  CL 109 107 106  CO2 23 23 24   GLUCOSE 111* 59* 129*  BUN 55* 52* 56*  CREATININE 3.51* 3.57* 3.91*  CALCIUM 8.6* 8.4* 8.2*   LFT Recent Labs    04/11/17 0405  PROT 6.3*  ALBUMIN 2.8*  AST 13*  ALT 13*  ALKPHOS 85  BILITOT 1.0   PT/INR Recent Labs    04/11/17 0405 04/12/17 1011  LABPROT 45.4* 44.6*  INR 4.90* 4.79*   Hepatitis Panel No results for input(s): HEPBSAG, HCVAB, HEPAIGM, HEPBIGM in the last 72 hours.  Studies/Results: Dg Chest Port 1 View  Result Date: 04/12/2017 CLINICAL DATA:  Hypoxia EXAM: PORTABLE CHEST 1 VIEW COMPARISON:  04/11/2017 FINDINGS: Diffuse bilateral airspace disease with mild interval progression. Bilateral pleural effusions also with mild progression. Cardiac enlargement. Aortic valve replacement. Left atrial clip. Bibasilar atelectasis. IMPRESSION: Progression of congestive heart failure. Worsening edema and bilateral effusions. Electronically Signed   By: Franchot Gallo M.D.   On: 04/12/2017 06:37   Dg Chest Port 1 View  Result Date: 04/11/2017 CLINICAL DATA:  Shortness of Breath EXAM: PORTABLE CHEST 1 VIEW COMPARISON:  April 10, 2017 FINDINGS: There is cardiomegaly with pulmonary venous  hypertension. There are pleural effusions bilaterally with bibasilar atelectasis. No airspace consolidation or pulmonary edema evident. There is a prosthetic aortic valve present. There is a left atrial appendage clamp. No adenopathy. There is postoperative total shoulder replacement on the left. IMPRESSION: Pulmonary vascular congestion with bibasilar atelectasis and small pleural effusions. No frank edema or consolidation evident. Areas of postoperative change noted. Electronically Signed   By: Lowella Grip III M.D.   On: 04/11/2017 11:19    Assessment/Plan:   1. Acute kidney injury - patient on ACE as outpatient but  no hypotension noted  Increased pulmonary infiltrates and a concern of hematuria and proteinuria noted on routine lab testing There is definitely a concern of pulmonary renal syndrome and the possibility of ANCA vasculitis exists  There is also the possibility that there is a hematological process with the thrombocytopenia and that anemia that exists in the complete blood count. There is no obstruction and the pressure has been good with no evidence of hypotension. Intrinsic renal disease is a real possibility and I agree with Dr Florene Glen and would send serologies . However I am not sure that there would be ideal therapy due to advanced age and comorbid disease as well as his overall wishes that would be consistent with a more conservative approach and no dialysis. 2. Volume  Appears to be euvolemic at this time and less inclined to continue IV diuretics and would suggest that we convert to oral torsemide 40mg  BID 3. Anemia would check Iron studies and B12 Folate  Thank you for a very interesting consult and we shall continue to follow up with you on the lab results / It may be helpful for palliative medicine to help in the care of this very nice man   LOS: 2 Aine Strycharz W @TODAY @1 :04 PM

## 2017-04-13 ENCOUNTER — Inpatient Hospital Stay (HOSPITAL_COMMUNITY): Payer: PPO

## 2017-04-13 LAB — CBC
HCT: 24.7 % — ABNORMAL LOW (ref 39.0–52.0)
Hemoglobin: 8.1 g/dL — ABNORMAL LOW (ref 13.0–17.0)
MCH: 30.2 pg (ref 26.0–34.0)
MCHC: 32.8 g/dL (ref 30.0–36.0)
MCV: 92.2 fL (ref 78.0–100.0)
PLATELETS: 98 10*3/uL — AB (ref 150–400)
RBC: 2.68 MIL/uL — AB (ref 4.22–5.81)
RDW: 16.6 % — AB (ref 11.5–15.5)
WBC: 6.4 10*3/uL (ref 4.0–10.5)

## 2017-04-13 LAB — HEPATITIS PANEL, ACUTE
HCV Ab: <0.1 s/co ratio (ref 0.0–0.9)
HEP A IGM: NEGATIVE
HEP B S AG: NEGATIVE
Hep B C IgM: NEGATIVE

## 2017-04-13 LAB — GLUCOSE, CAPILLARY
Glucose-Capillary: 152 mg/dL — ABNORMAL HIGH (ref 65–99)
Glucose-Capillary: 117 mg/dL — ABNORMAL HIGH (ref 65–99)
Glucose-Capillary: 165 mg/dL — ABNORMAL HIGH (ref 65–99)
Glucose-Capillary: 151 mg/dL — ABNORMAL HIGH (ref 65–99)
Glucose-Capillary: 216 mg/dL — ABNORMAL HIGH (ref 65–99)

## 2017-04-13 LAB — PROTIME-INR
INR: 3.48
PROTHROMBIN TIME: 34.7 s — AB (ref 11.4–15.2)

## 2017-04-13 LAB — BASIC METABOLIC PANEL
Anion gap: 9 (ref 5–15)
BUN: 65 mg/dL — AB (ref 6–20)
CALCIUM: 8.1 mg/dL — AB (ref 8.9–10.3)
CO2: 24 mmol/L (ref 22–32)
Chloride: 106 mmol/L (ref 101–111)
Creatinine, Ser: 4.38 mg/dL — ABNORMAL HIGH (ref 0.61–1.24)
GFR calc Af Amer: 12 mL/min — ABNORMAL LOW (ref >60)
GFR, EST NON AFRICAN AMERICAN: 11 mL/min — AB (ref >60)
GLUCOSE: 136 mg/dL — AB (ref 65–99)
POTASSIUM: 3.9 mmol/L (ref 3.5–5.1)
SODIUM: 139 mmol/L (ref 135–145)

## 2017-04-13 LAB — HEMOGLOBIN AND HEMATOCRIT, BLOOD
HCT: 24.9 % — ABNORMAL LOW (ref 39.0–52.0)
HEMOGLOBIN: 8.1 g/dL — AB (ref 13.0–17.0)

## 2017-04-13 MED ORDER — DARBEPOETIN ALFA 100 MCG/0.5ML IJ SOSY
100.0000 ug | PREFILLED_SYRINGE | Freq: Once | INTRAMUSCULAR | Status: AC
  Administered 2017-04-13: 100 ug via SUBCUTANEOUS
  Filled 2017-04-13 (×2): qty 0.5

## 2017-04-13 NOTE — Progress Notes (Signed)
PROGRESS NOTE    Ronald [name]  JKK:[id number] DOB: [birth year] DOA: 04/10/2017 PCP: Ronald Baton, MD   Brief Narrative:  Per HPI Ronald [name] is Ronald [name] 84 y.o. male with medical history significant of peripheral vascular disease status post vent placement, renal artery stenosis that is post angioplasty with stent and rotational atherectomy in 7169, diastolic heart failure (last echo 03/12/2017), stage III sent chronic kidney disease, hypertension, paroxysmal atrial fibrillation on warfarin, COPD, aortic stenosis status post bovine AV valve replacement in 01/07/2011, hyperlipidemia who comes in with increasing shortness of breath.  Patient is an extremely poor historian.  Patient reports that approximately 2 days ago he began to develop worsening shortness of breath at night.  He cannot tell me if this was when he was lying flat.  He does report some lower extremity edema.  He denies any cough, congestion, rhinorrhea, sputum, fevers, chest pain, abdominal pain.  He also reported several episodes of "dark tarry diarrhea during this time".  He denies any hematemesis.  He is last had colonoscopies and endoscopies in 2008 and 2005 respectively.  He does have Ronald [name] history of prior [name] bleeding and has been evaluated byLeBaeur [name].  Assessment & Plan:   Active Problems:   Essential hypertension   PVD, Rt SFA PTA/HSRA 06/17/11   AS (aortic stenosis)- s/p tissue AVR 01/07/11   Thrombocytopenia (HCC)   Chronic atrial fibrillation (HCC)   COPD (chronic obstructive pulmonary disease) (HCC)   Acute on chronic diastolic (congestive) heart failure (HCC)   Chronic renal insufficiency, stage IV (severe) ([place])   Diabetes mellitus type 2, uncontrolled (HCC)   Acute blood loss anemia   [name] bleed   AKI (acute kidney injury) ([place])   ##) Acute on chronic diastolic congestive heart failure:  CXR 2/17 with stable cardiomegaly and persistent moderate interstitial edema, small amount of pleural fluid This AM with some  dependent edema and trace LEE.  Stable. Echo 03/12/17 with normal EF, mod pulm hypertension (not sufficient to allow eval of LV diastolic dysfunction) -With worsening renal function with diuresis, c/s nephrology, appreciate recommendations - holding diuresis today with worsening renal function -Weight daily, strict ins and outs, 1.5 L fluid restriction, 2 g sodium restricted diet - net negative so far this hospitalization, weight downtrending  Filed Weights   04/11/17 0500 04/12/17 0442 04/13/17 0513  Weight: 79.6 kg (175 lb 6.4 oz) 82.7 kg (182 lb 4.8 oz) 78.5 kg (173 lb 1.6 oz)   ##) Acute lower [name] bleed causing acute on chronic anemia: Poor historian.  Notes "dark stools" for Ronald [name] while, but with stable H/H not sure how significant his bleeding is.  Per [name] at this point in time, will continue to monitor.  No plans for EGD at the present.  Will allow INR to drift down.  Will plan on continuing anticoagulation given his risk factors below without evidence of significant bleeding. - H/H relatively stable since transfusion (Hb 8.1 today has been waxing/waning since transfusion on 2/14 - it was 8.5 at midnight on 2/15) - slowly downtrending again, follow closely - 2/14 - 1 unit pRBC -Continue pantoprazole PO daily  -[name] consult appreciate recs (currently signed off, no plans for intervention at this time.  If it continues to downtrend, may need to discuss again with [name]) -Continue ferrous sulfate 325 twice daily  -- daily CBC -- follow iron, ferritin, b12, folate  ##) Acute on chronic kidney injury:  Suspect that this is likely 2/2 hypervolemia with heart failure above. Creatinine  worsened today with diuresis. - Appreciate nephrology recs  -diuresis on hold - UA with RBC's and protein, will repeat and add on culture - follow GN labs (ANA, ANCA, antiGBM, SPEP, C3, C4, Hep B/C) - follow renal US (without hydronephrosis)  ##) Elevated INR: -Hold warfarin in setting of elevated INR and [name]  bleed - plan to allow this to drift down - slowly downtrending  ##) COPD: -Continue long-acting muscarinic/long-acting beta agonist -Continue as needed albuterol  ##) T2DM:  - hold lantus - Sliding scale insulin, before meals at bedtime -Hold home glyburide  ##) Proximal atrial fibrillation  Hx bioprosthetic AVR: -Continue metoprolol succinate 50 mg twice Ronald [name] day  -hold warfarin  ##) Benign prostatic hypertrophy: -Continue finasteride 5 mg nightly -Continue doxazosin 4 mg daily  ##) hypertension: -Continue metoprolol, hold benazepril in setting of AK I  ##) Hyperlipidemia: -Continue simvastatin 20 mg daily  ##) Psych: -Continue bupropion 150 mg daily -Continue as needed tramadol 50 mg twice Ronald [name] day  ##) Osteoporosis: - Continue alendronate 70 mg q. 2 weeks (per [name], consider holding this at d/c)  DVT prophylaxis: SCD Code Status: full  Family Communication: discussed with son 2/16 Disposition Plan: pending improvement in resp status and follow H/H   Consultants:   [name]  Procedures:   none  Antimicrobials:  Anti-infectives (From admission, onward)   None        Subjective: Feeling better.  Objective: Vitals:   04/12/17 2107 04/12/17 2224 04/13/17 0513 04/13/17 1337  BP: (!) 132/56  (!) 149/62 (!) 148/66  Pulse: (!) 56 65 62 (!) 58  Resp: 20  18 16   Temp: 98.5 F (36.9 C)  98.4 F (36.9 C) 97.8 F (36.6 C)  TempSrc: Oral  Oral Oral  SpO2: 95%  96% 96%  Weight:   78.5 kg (173 lb 1.6 oz)   Height:        Intake/Output Summary (Last 24 hours) at 04/13/2017 1543 Last data filed at 04/13/2017 1423 Gross per 24 hour  Intake 953 ml  Output 875 ml  Net 78 ml   Filed Weights   04/11/17 0500 04/12/17 0442 04/13/17 0513  Weight: 79.6 kg (175 lb 6.4 oz) 82.7 kg (182 lb 4.8 oz) 78.5 kg (173 lb 1.6 oz)    Examination:  General: No acute distress. Cardiovascular: Heart sounds show Ronald [name] regular rate, and rhythm. No gallops or rubs.  Lungs: Clear to  auscultation bilaterally with good air movement. No rales, rhonchi or wheezes. Abdomen: Soft, nontender, nondistended with normal active bowel sounds. No masses. No hepatosplenomegaly. Neurological: Alert and oriented. Moves all extremities 4. Cranial nerves II through XII grossly intact. Skin: Warm and dry. No rashes or lesions. Extremities: No clubbing or cyanosis. Dependent edema.  No LEE.  Psychiatric: Mood and affect are normal. Insight and judgment are appropriate.   Data Reviewed: I have personally reviewed following labs and imaging studies  CBC: Recent Labs  Lab 04/10/17 0251  04/11/17 0007 04/11/17 0405 04/11/17 0818 04/12/17 0426 04/13/17 0444  WBC 7.0  --   --  7.0  --  7.1 6.4  HGB 7.6*   < > 8.5* 9.0*  9.2* 9.6* 8.5* 8.1*  HCT 23.1*   < > 25.3* 26.9*  27.3* 29.0* 25.4* 24.7*  MCV 91.7  --   --  90.9  --  91.4 92.2  PLT 126*  --   --  102*  --  113* 98*   < > = values in this interval not displayed.  Basic Metabolic Panel: Recent Labs  Lab 04/10/17 0251 04/11/17 0405 04/12/17 0426 04/13/17 0444  NA 143 141 139 139  K 4.5 3.9 4.1 3.9  CL 109 107 106 106  CO2 23 23 24 24   GLUCOSE 111* 59* 129* 136*  BUN 55* 52* 56* 65*  CREATININE 3.51* 3.57* 3.91* 4.38*  CALCIUM 8.6* 8.4* 8.2* 8.1*  MG  --   --  2.4  --    GFR: Estimated Creatinine Clearance: 11.6 mL/min (Chastity Noland) (by C-G formula based on SCr of 4.38 mg/dL (H)). Liver Function Tests: Recent Labs  Lab 04/11/17 0405  AST 13*  ALT 13*  ALKPHOS 85  BILITOT 1.0  PROT 6.3*  ALBUMIN 2.8*   No results for input(s): LIPASE, AMYLASE in the last 168 hours. No results for input(s): AMMONIA in the last 168 hours. Coagulation Profile: Recent Labs  Lab 04/10/17 0402 04/11/17 0405 04/12/17 1011 04/13/17 0444  INR 4.86* 4.90* 4.79* 3.48   Cardiac Enzymes: No results for input(s): CKTOTAL, CKMB, CKMBINDEX, TROPONINI in the last 168 hours. BNP (last 3 results) No results for input(s): PROBNP in the last  8760 hours. HbA1C: No results for input(s): HGBA1C in the last 72 hours. CBG: Recent Labs  Lab 04/12/17 1229 04/12/17 1702 04/12/17 2113 04/13/17 0749 04/13/17 1143  GLUCAP 151* 138* 152* 117* 165*   Lipid Profile: No results for input(s): CHOL, HDL, LDLCALC, TRIG, CHOLHDL, LDLDIRECT in the last 72 hours. Thyroid Function Tests: No results for input(s): TSH, T4TOTAL, FREET4, T3FREE, THYROIDAB in the last 72 hours. Anemia Panel: Recent Labs    04/12/17 1648  VITAMINB12 577  FOLATE 10.3  FERRITIN 77  TIBC 162*  IRON 35*   Sepsis Labs: No results for input(s): PROCALCITON, LATICACIDVEN in the last 168 hours.  Recent Results (from the past 240 hour(s))  MRSA PCR Screening     Status: None   Collection Time: 04/10/17  2:43 PM  Result Value Ref Range Status   MRSA by PCR NEGATIVE NEGATIVE Final    Comment:        The GeneXpert MRSA Assay (FDA approved for NASAL specimens only), is one component of [facility] Kuper comprehensive MRSA colonization surveillance program. It is not intended to diagnose MRSA infection nor to guide or monitor treatment for MRSA infections. Performed at [facility], [place] [address], [place], [place] [address]          Radiology Studies: US Renal  Result Date: 04/12/2017 CLINICAL DATA:  Acute kidney injury EXAM: RENAL / URINARY TRACT ULTRASOUND COMPLETE COMPARISON:  CT abdomen pelvis 06/29/2011 FINDINGS: Right Kidney: Length: 11.2 cm. Cortical thinning diffusely with increased echogenicity of the cortex. Right upper pole cyst 14 mm. Negative for hydronephrosis or mass. Left Kidney: Length: 11.2 cm. Mild increased echogenicity of the cortex. Left upper pole cyst 2.4 cm. Negative for mass or hydronephrosis. Bladder: Appears normal for degree of bladder distention. IMPRESSION: Increased echogenicity of the renal cortex bilaterally. Cortical thinning noted in the right kidney. Bilateral renal cysts. Negative for hydronephrosis. Electronically  Signed   By: Franchot Gallo M.D.   On: 04/12/2017 13:04   Dg Chest Port 1 View  Result Date: 04/13/2017 CLINICAL DATA:  Hypoxia. EXAM: PORTABLE CHEST 1 VIEW COMPARISON:  04/12/2017 FINDINGS: Lungs are adequately inflated and demonstrate continued hazy perihilar bibasilar opacification likely persistent moderate interstitial edema and possible small bilateral pleural effusions. Stable cardiomegaly. Remainder the exam is unchanged. IMPRESSION: Stable cardiomegaly and persistent moderate interstitial edema. Likely small amount of bilateral pleural  fluid. Electronically Signed   By: Marin Olp M.D.   On: 04/13/2017 08:05   Dg Chest Port 1 View  Result Date: 04/12/2017 CLINICAL DATA:  Hypoxia EXAM: PORTABLE CHEST 1 VIEW COMPARISON:  04/11/2017 FINDINGS: Diffuse bilateral airspace disease with mild interval progression. Bilateral pleural effusions also with mild progression. Cardiac enlargement. Aortic valve replacement. Left atrial clip. Bibasilar atelectasis. IMPRESSION: Progression of congestive heart failure. Worsening edema and bilateral effusions. Electronically Signed   By: Franchot Gallo M.D.   On: 04/12/2017 06:37        Scheduled Meds: . buPROPion  150 mg Oral Daily  . darbepoetin (ARANESP) injection - NON-DIALYSIS  100 mcg Subcutaneous Once  . doxazosin  4 mg Oral Daily  . ferrous sulfate  325 mg Oral BID  . finasteride  5 mg Oral QHS  . fluticasone  1 spray Each Nare BID  . insulin aspart  0-5 Units Subcutaneous QHS  . insulin aspart  0-9 Units Subcutaneous TID WC  . loratadine  10 mg Oral Daily  . Melatonin  5 mg Oral QHS  . metoprolol succinate  50 mg Oral BID  . pantoprazole  40 mg Oral Daily  . simvastatin  20 mg Oral QPM  . sodium chloride flush  3 mL Intravenous Q12H  . umeclidinium-vilanterol  1 puff Inhalation Daily   Continuous Infusions: . sodium chloride       LOS: 3 days    Time spent: over 20 min    Fayrene Helper, MD [facility] Pager  [phone]  If 7PM-7AM, please contact night-coverage [email or web address] Password [facility] 04/13/2017, 3:43 PM

## 2017-04-13 NOTE — Progress Notes (Signed)
[name] ROUNDING NOTE   Subjective:   This is a delightful 84 year old man that is from assisted living although according to [place] his daughter has had a decline function since November  He was admitted with congestive heart failure with what appears to be diastolic dysfunction and normal ejection fraction   He has bovine aortic valve and atrial fibrillation   His baseline renal function appears to be about 1.5 and was admitted with a creatinine of 3.5 As an outpatient he was taking an ACE benazepril according to my records. Renal ultrasound is unrevealing,  He appears to have been [facility] and does not appear to have received any Non steroidals   He does smoke 1 PPD  He has had a great diuresis with IV lasix although this is not reflected in weights and suspect that this is due to additional mechanical weight of the care items  He diureses 2 L and appears comfortable on oxygen\ Urinalysis showed some proteinuria and hematuria and this is concerning for possible intrinsic renal disease and possible glomerulonephritis  His blood pressure has been controlled with no evidence of hypotension  He looks great this morning although his mucus membranes are dry. Discussed with Dr Florene Glen, who would like to hold his lasix. This appears reasonable to me at this point     Objective:  Vital signs in last 24 hours:  Temp:  [97.6 F (36.4 C)-98.5 F (36.9 C)] 98.4 F (36.9 C) (02/17 0513) Pulse Rate:  [56-65] 62 (02/17 0513) Resp:  [14-20] 18 (02/17 0513) BP: (132-149)/(50-62) 149/62 (02/17 0513) SpO2:  [95 %-96 %] 96 % (02/17 0513) Weight:  [173 lb 1.6 oz (78.5 kg)] 173 lb 1.6 oz (78.5 kg) (02/17 0513)  Weight change: -3.2 oz (-4.173 kg) Filed Weights   04/11/17 0500 04/12/17 0442 04/13/17 0513  Weight: 175 lb 6.4 oz (79.6 kg) 182 lb 4.8 oz (82.7 kg) 173 lb 1.6 oz (78.5 kg)    Intake/Output: I/O last 3 completed shifts: In: 840 [P.O.:840] Out: 1600  [Urine:1600]   Intake/Output this shift:  Total I/O In: 110 [P.O.:110] Out: 100 [Urine:100]  Dry mucus membranes Neck:  Supple; no masses or thyromegaly. JVP not elevated Lungs:  Clear throughout to auscultation.   No wheezes, crackles, or rhonchi. No acute distress. Heart:  Regular rate and rhythm; systolic murmur Abdomen:  Soft, nontender and nondistended. No masses, hepatosplenomegaly or hernias noted. Normal bowel sounds, without guarding, and without rebound.   Pulses:  No carotid, renal, femoral bruits. DP and PT symmetrical and equal Extremities:  Without clubbing or edema.       Basic Metabolic Panel: Recent Labs  Lab 04/10/17 0251 04/11/17 0405 04/12/17 0426 04/13/17 0444  NA 143 141 139 139  K 4.5 3.9 4.1 3.9  CL 109 107 106 106  CO2 23 23 24 24   GLUCOSE 111* 59* 129* 136*  BUN 55* 52* 56* 65*  CREATININE 3.51* 3.57* 3.91* 4.38*  CALCIUM 8.6* 8.4* 8.2* 8.1*  MG  --   --  2.4  --     Liver Function Tests: Recent Labs  Lab 04/11/17 0405  AST 13*  ALT 13*  ALKPHOS 85  BILITOT 1.0  PROT 6.3*  ALBUMIN 2.8*   No results for input(s): LIPASE, AMYLASE in the last 168 hours. No results for input(s): AMMONIA in the last 168 hours.  CBC: Recent Labs  Lab 04/10/17 0251  04/11/17 0007 04/11/17 0405 04/11/17 0818 04/12/17 0426 04/13/17 0444  WBC 7.0  --   --  7.0  --  7.1 6.4  HGB 7.6*   < > 8.5* 9.0*  9.2* 9.6* 8.5* 8.1*  HCT 23.1*   < > 25.3* 26.9*  27.3* 29.0* 25.4* 24.7*  MCV 91.7  --   --  90.9  --  91.4 92.2  PLT 126*  --   --  102*  --  113* 98*   < > = values in this interval not displayed.    Cardiac Enzymes: No results for input(s): CKTOTAL, CKMB, CKMBINDEX, TROPONINI in the last 168 hours.  BNP: Invalid input(s): POCBNP  CBG: Recent Labs  Lab 04/12/17 0751 04/12/17 1229 04/12/17 1702 04/12/17 2113 04/13/17 0749  GLUCAP 131* 151* 138* 152* 117*    Microbiology: Results for orders placed or performed during the hospital  encounter of 04/10/17  MRSA PCR Screening     Status: None   Collection Time: 04/10/17  2:43 PM  Result Value Ref Range Status   MRSA by PCR NEGATIVE NEGATIVE Final    Comment:        The GeneXpert MRSA Assay (FDA approved for NASAL specimens only), is one component of a comprehensive MRSA colonization surveillance program. It is not intended to diagnose MRSA infection nor to guide or monitor treatment for MRSA infections. Performed at [facility], [place] [address] [place], [place] [address]     Coagulation Studies: Recent Labs    04/11/17 0405 04/12/17 1011 04/13/17 0444  LABPROT 45.4* 44.6* 34.7*  INR 4.90* 4.79* 3.48    Urinalysis: No results for input(s): COLORURINE, LABSPEC, PHURINE, GLUCOSEU, HGBUR, BILIRUBINUR, KETONESUR, PROTEINUR, UROBILINOGEN, NITRITE, LEUKOCYTESUR in the last 72 hours.  Invalid input(s): APPERANCEUR    Imaging: US Renal  Result Date: 04/12/2017 CLINICAL DATA:  Acute kidney injury EXAM: RENAL / URINARY TRACT ULTRASOUND COMPLETE COMPARISON:  CT abdomen pelvis 06/29/2011 FINDINGS: Right Kidney: Length: 11.2 cm. Cortical thinning diffusely with increased echogenicity of the cortex. Right upper pole cyst 14 mm. Negative for hydronephrosis or mass. Left Kidney: Length: 11.2 cm. Mild increased echogenicity of the cortex. Left upper pole cyst 2.4 cm. Negative for mass or hydronephrosis. Bladder: Appears normal for degree of bladder distention. IMPRESSION: Increased echogenicity of the renal cortex bilaterally. Cortical thinning noted in the right kidney. Bilateral renal cysts. Negative for hydronephrosis. Electronically Signed   By: Franchot Gallo M.D.   On: 04/12/2017 13:04   Dg Chest Port 1 View  Result Date: 04/13/2017 CLINICAL DATA:  Hypoxia. EXAM: PORTABLE CHEST 1 VIEW COMPARISON:  04/12/2017 FINDINGS: Lungs are adequately inflated and demonstrate continued hazy perihilar bibasilar opacification likely persistent moderate  interstitial edema and possible small bilateral pleural effusions. Stable cardiomegaly. Remainder the exam is unchanged. IMPRESSION: Stable cardiomegaly and persistent moderate interstitial edema. Likely small amount of bilateral pleural fluid. Electronically Signed   By: Marin Olp M.D.   On: 04/13/2017 08:05   Dg Chest Port 1 View  Result Date: 04/12/2017 CLINICAL DATA:  Hypoxia EXAM: PORTABLE CHEST 1 VIEW COMPARISON:  04/11/2017 FINDINGS: Diffuse bilateral airspace disease with mild interval progression. Bilateral pleural effusions also with mild progression. Cardiac enlargement. Aortic valve replacement. Left atrial clip. Bibasilar atelectasis. IMPRESSION: Progression of congestive heart failure. Worsening edema and bilateral effusions. Electronically Signed   By: Franchot Gallo M.D.   On: 04/12/2017 06:37     Medications:   . sodium chloride     . buPROPion  150 mg Oral Daily  . doxazosin  4 mg Oral Daily  .  ferrous sulfate  325 mg Oral BID  . finasteride  5 mg Oral QHS  . fluticasone  1 spray Each Nare BID  . insulin aspart  0-5 Units Subcutaneous QHS  . insulin aspart  0-9 Units Subcutaneous TID WC  . loratadine  10 mg Oral Daily  . Melatonin  5 mg Oral QHS  . metoprolol succinate  50 mg Oral BID  . pantoprazole  40 mg Oral Daily  . simvastatin  20 mg Oral QPM  . sodium chloride flush  3 mL Intravenous Q12H  . umeclidinium-vilanterol  1 puff Inhalation Daily   acetaminophen **OR** acetaminophen, albuterol, ondansetron **OR** ondansetron (ZOFRAN) IV, polyethylene glycol, traMADol  Assessment/ Plan:  1. Acute kidney injury - patient on ACE as outpatient but no hypotension noted  Increased pulmonary infiltrates and a concern of hematuria and proteinuria noted on routine lab testing There is definitely a concern of pulmonary renal syndrome and the possibility of ANCA vasculitis exists  There is also the possibility that there is a hematological process with the thrombocytopenia  and that anemia that exists in the complete blood count. There is no obstruction and the pressure has been good with no evidence of hypotension. Intrinsic renal disease is a real possibility and I agree with Dr Florene Glen and would send serologies . However I am not sure that there would be ideal therapy due to advanced age and comorbid disease as well as his overall wishes that would be consistent with a more conservative approach and no dialysis. We are still waiting of serology 2. Volume  Appears to be hypovolemic and would benefit from a diuretic holiday today  Would not use IV fluids  3. Anemia  Looks like anemia of chronic disease and will give some Darbepoietin  Thank you for a very interesting consult and we shall continue to follow up with you on the lab results / It may be helpful for palliative medicine to help in the care of this very nice man        LOS: 3 Ronald [name] W @TODAY @11 :29 AM

## 2017-04-13 NOTE — Progress Notes (Signed)
Pt oxygen saturation 95% on room air at rest. Ambulated to bathroom with assist oxygen saturation continued to stay at 95% on room air. Became very weak along with dizziness. Pt ambulated total of 50 ft with 1-assist.

## 2017-04-14 LAB — PROTIME-INR
INR: 2.62
PROTHROMBIN TIME: 27.8 s — AB (ref 11.4–15.2)

## 2017-04-14 LAB — COMPREHENSIVE METABOLIC PANEL
ALBUMIN: 2.5 g/dL — AB (ref 3.5–5.0)
ALT: 11 U/L — ABNORMAL LOW (ref 17–63)
ANION GAP: 9 (ref 5–15)
AST: 13 U/L — AB (ref 15–41)
Alkaline Phosphatase: 71 U/L (ref 38–126)
BUN: 75 mg/dL — AB (ref 6–20)
CHLORIDE: 105 mmol/L (ref 101–111)
CO2: 24 mmol/L (ref 22–32)
Calcium: 8 mg/dL — ABNORMAL LOW (ref 8.9–10.3)
Creatinine, Ser: 4.67 mg/dL — ABNORMAL HIGH (ref 0.61–1.24)
GFR calc Af Amer: 11 mL/min — ABNORMAL LOW (ref >60)
GFR calc non Af Amer: 10 mL/min — ABNORMAL LOW (ref >60)
GLUCOSE: 110 mg/dL — AB (ref 65–99)
POTASSIUM: 3.9 mmol/L (ref 3.5–5.1)
SODIUM: 138 mmol/L (ref 135–145)
Total Bilirubin: 0.5 mg/dL (ref 0.3–1.2)
Total Protein: 5.7 g/dL — ABNORMAL LOW (ref 6.5–8.1)

## 2017-04-14 LAB — TYPE AND SCREEN
ABO/RH(D): O POS
ANTIBODY SCREEN: NEGATIVE
UNIT DIVISION: 0
UNIT DIVISION: 0

## 2017-04-14 LAB — BPAM RBC
BLOOD PRODUCT EXPIRATION DATE: 201903192359
Blood Product Expiration Date: 201903182359
ISSUE DATE / TIME: 201902141458
UNIT TYPE AND RH: 5100
Unit Type and Rh: 5100

## 2017-04-14 LAB — CBC
HEMATOCRIT: 23.7 % — AB (ref 39.0–52.0)
HEMOGLOBIN: 7.8 g/dL — AB (ref 13.0–17.0)
MCH: 30 pg (ref 26.0–34.0)
MCHC: 32.9 g/dL (ref 30.0–36.0)
MCV: 91.2 fL (ref 78.0–100.0)
Platelets: 95 10*3/uL — ABNORMAL LOW (ref 150–400)
RBC: 2.6 MIL/uL — ABNORMAL LOW (ref 4.22–5.81)
RDW: 16.6 % — AB (ref 11.5–15.5)
WBC: 6.5 10*3/uL (ref 4.0–10.5)

## 2017-04-14 LAB — GLUCOSE, CAPILLARY
Glucose-Capillary: 113 mg/dL — ABNORMAL HIGH (ref 65–99)
Glucose-Capillary: 184 mg/dL — ABNORMAL HIGH (ref 65–99)
Glucose-Capillary: 148 mg/dL — ABNORMAL HIGH (ref 65–99)
Glucose-Capillary: 258 mg/dL — ABNORMAL HIGH (ref 65–99)

## 2017-04-14 LAB — HEMOGLOBIN AND HEMATOCRIT, BLOOD
HCT: 24.7 % — ABNORMAL LOW (ref 39.0–52.0)
Hemoglobin: 8.2 g/dL — ABNORMAL LOW (ref 13.0–17.0)

## 2017-04-14 LAB — ANA W/REFLEX IF POSITIVE: ANA: NEGATIVE

## 2017-04-14 LAB — GLOMERULAR BASEMENT MEMBRANE ANTIBODIES: GBM AB: 5 U (ref 0–20)

## 2017-04-14 MED ORDER — METOPROLOL SUCCINATE ER 25 MG PO TB24
25.0000 mg | ORAL_TABLET | Freq: Two times a day (BID) | ORAL | Status: DC
  Administered 2017-04-14 – 2017-04-15 (×2): 25 mg via ORAL
  Filled 2017-04-14 (×2): qty 1

## 2017-04-14 NOTE — Care Management Important Message (Signed)
Important Message  Patient Details  Name: Ronald [name] MRN: [id number] Date of Birth: 05-05-1922   Medicare Important Message Given:  Yes    Kerin Salen 04/14/2017, 11:06 AMImportant Message  Patient Details  Name: Ronald [name] MRN: [id number] Date of Birth: 1922/11/14   Medicare Important Message Given:  Yes    Kerin Salen 04/14/2017, 11:05 AM

## 2017-04-14 NOTE — Progress Notes (Signed)
Physical Therapy Treatment Patient Details Name: Ronald [name] MRN: [id number] DOB: September 13, 1922 Today's Date: 04/14/2017    History of Present Illness Pt is a 84 y.o. male who comes in with increasing shortness of breath, Hx significant for peripheral vascular disease status post vent placement, renal artery stenosis that is post angioplasty with stent and rotational atherectomy in 9407, diastolic heart failure (last echo 03/12/2017), stage III sent chronic kidney disease, hypertension, paroxysmal atrial fibrillation on warfarin, COPD, aortic stenosis status post bovine AV valve replacement in 01/07/2011, hyperlipidemia.    PT Comments    Patient received in chair, pleasant and willing to work with skilled PT services this afternoon. He continues to require MinA for sit to stand, as well as MinA to gain balance due to initial strong posterior loss of balance. Once stabilized, he was able to gait train approximately 78f today with moderate dyspnea but SpO2 remaining in the 90s. He was left up in the chair with alarm set, all needs otherwise met, and lab staff present and attending. He continues to remain appropriate for the SNF setting moving forward.     Follow Up Recommendations  Supervision/Assistance - 24 hour;Other (comment);SNF     Equipment Recommendations  Rolling walker with 5" wheels    Recommendations for Other Services       Precautions / Restrictions Precautions Precautions: Fall Precaution Comments: monitor sats Restrictions Weight Bearing Restrictions: No    Mobility  Bed Mobility               General bed mobility comments: DNT, received up in chair   Transfers Overall transfer level: Needs assistance Equipment used: Rolling walker (2 wheeled) Transfers: Sit to/from Stand Sit to Stand: Min assist         General transfer comment: MinA for boost up to full standing position, MinA to gain balance in standing as patient intially with strong posterior lean    Ambulation/Gait Ambulation/Gait assistance: Min guard Ambulation Distance (Feet): 50 Feet Assistive device: Rolling walker (2 wheeled) Gait Pattern/deviations: Step-through pattern;Decreased dorsiflexion - right     General Gait Details: cues for safety and pursed lip breathing provided, SpO2 remained in the 90s during gait but patietn continues to have moderate dyspnea    Stairs            Wheelchair Mobility    Modified Rankin (Stroke Patients Only)       Balance Overall balance assessment: Needs assistance Sitting-balance support: Feet supported;Bilateral upper extremity supported Sitting balance-Leahy Scale: Good     Standing balance support: Bilateral upper extremity supported;During functional activity Standing balance-Leahy Scale: Poor Standing balance comment: strong posterior lean/LOB after initial stand                             Cognition Arousal/Alertness: Awake/alert Behavior During Therapy: WFL for tasks assessed/performed Overall Cognitive Status: No family/caregiver present to determine baseline cognitive functioning                                        Exercises      General Comments        Pertinent Vitals/Pain Pain Assessment: No/denies pain    Home Living                      Prior Function  PT Goals (current goals can now be found in the care plan section) Acute Rehab PT Goals PT Goal Formulation: With patient Time For Goal Achievement: 04/25/17 Potential to Achieve Goals: Good Progress towards PT goals: Progressing toward goals    Frequency    Min 3X/week      PT Plan Current plan remains appropriate    Co-evaluation              AM-PAC PT "6 Clicks" Daily Activity  Outcome Measure  Difficulty turning over in bed (including adjusting bedclothes, sheets and blankets)?: Unable Difficulty moving from lying on back to sitting on the side of the bed? :  Unable Difficulty sitting down on and standing up from a chair with arms (e.g., wheelchair, bedside commode, etc,.)?: Unable Help needed moving to and from a bed to chair (including a wheelchair)?: A Lot Help needed walking in hospital room?: A Little Help needed climbing 3-5 steps with a railing? : Total 6 Click Score: 9    End of Session Equipment Utilized During Treatment: Gait belt Activity Tolerance: Patient limited by fatigue Patient left: in chair;with chair alarm set;with call bell/phone within reach;Other (comment)(lab staff present and attending )   PT Visit Diagnosis: Muscle weakness (generalized) (M62.81);Difficulty in walking, not elsewhere classified (R26.2)     Time: 5041-3643 PT Time Calculation (min) (ACUTE ONLY): 12 min  Charges:  $Gait Training: 8-22 mins                    G Codes:       Deniece Ree PT, DPT, CBIS  Supplemental Physical Therapist Short   Pager [phone]

## 2017-04-14 NOTE — Progress Notes (Addendum)
PROGRESS NOTE    Ronald [name]  JSE:[id number] DOB: 01-13-1923 DOA: 04/10/2017 PCP: [name], MD   Brief Narrative:  Per HPI Ronald [name] is Ronald [name] 84 y.o. male with medical history significant of peripheral vascular disease status post vent placement, renal artery stenosis that is post angioplasty with stent and rotational atherectomy in 0737, diastolic heart failure (last echo 03/12/2017), stage III sent chronic kidney disease, hypertension, paroxysmal atrial fibrillation on warfarin, COPD, aortic stenosis status post bovine AV valve replacement in 01/07/2011, hyperlipidemia who comes in with increasing shortness of breath.  Patient is an extremely poor historian.  Patient reports that approximately 2 days ago he began to develop worsening shortness of breath at night.  He cannot tell me if this was when he was lying flat.  He does report some lower extremity edema.  He denies any cough, congestion, rhinorrhea, sputum, fevers, chest pain, abdominal pain.  He also reported several episodes of "dark tarry diarrhea during this time".  He denies any hematemesis.  He is last had colonoscopies and endoscopies in 2008 and 2005 respectively.  He does have Ronald [name] history of prior GI bleeding and [facility]  Assessment & Plan:   Active Problems:   Essential hypertension   PVD, Rt SFA PTA/HSRA 06/17/11   AS (aortic stenosis)- s/p tissue AVR 01/07/11   Thrombocytopenia (HCC)   Chronic atrial fibrillation (HCC)   COPD (chronic obstructive pulmonary disease) (HCC)   Acute on chronic diastolic (congestive) heart failure (HCC)   Chronic renal insufficiency, stage IV (severe) ([place])   Diabetes mellitus type 2, uncontrolled (HCC)   Acute blood loss anemia   GI bleed   AKI (acute kidney injury) ([place])   ##) Acute on chronic diastolic congestive heart failure:  CXR 2/17 with stable cardiomegaly and persistent moderate interstitial edema, small amount of pleural fluid Stable trace dependent  edema, but now on room air Echo 03/12/17 with normal EF, mod pulm hypertension (not sufficient to allow eval of LV diastolic dysfunction) -Holding diuresis at this point given improved resp status, elevated creatinine - c/s nephrology, appreciate recommendations -Weight daily, strict ins and outs, 1.5 L fluid restriction, 2 g sodium restricted diet  Filed Weights   04/12/17 0442 04/13/17 0513 04/14/17 0549  Weight: 82.7 kg (182 lb 4.8 oz) 78.5 kg (173 lb 1.6 oz) 78.6 kg (173 lb 4.8 oz)   ##) Acute lower GI bleed causing acute on chronic anemia: Poor historian.  Notes "dark stools" for Ronald [name] while, but with stable H/H not sure how significant his bleeding is.  Per GI at this point in time, will continue to monitor.  No plans for EGD at the present.  Will allow INR to drift down.  Will plan on continuing anticoagulation given his risk factors below without evidence of significant bleeding. - H/H relatively stable since transfusion (Hb 8.1 today has been waxing/waning since transfusion on 2/14 - it was 8.5 at midnight on 2/15) - slowly downtrending again, follow closely (if continues to downtrend, may need to revisit plan for anticoagulation or touch base with GI again) - 2/14 - 1 unit pRBC -Continue pantoprazole PO daily  -GI consult appreciate recs (currently signed off, no plans for intervention at this time.  If it continues to downtrend, may need to discuss again with GI) -Continue ferrous sulfate 325 twice daily  -- daily CBC -- follow iron, ferritin, b12, folate  ##) Acute on chronic kidney injury:  Suspect that this is likely 2/2 hypervolemia with  heart failure above. Creatinine worsened today with diuresis. - Appreciate nephrology recs  -diuresis on hold - UA with RBC's and protein, will repeat and add on culture (still pending collection) - follow GN labs (ANA, ANCA, antiGBM, SPEP, C3, C4, Hep B/C) - follow renal US (without hydronephrosis)  ##) Elevated INR: -Hold warfarin in  setting of elevated INR and GI bleed - plan to allow this to drift down - slowly downtrending - resume if H/H stable tomorrow  ##) COPD: -Continue long-acting muscarinic/long-acting beta agonist -Continue as needed albuterol  ##) T2DM:  - hold lantus - Sliding scale insulin, before meals at bedtime -Hold home glyburide  ##) Proximal atrial fibrillation  Hx bioprosthetic AVR: -Decrease metop with bradycardia -hold warfarin  ##) Benign prostatic hypertrophy: -Continue finasteride 5 mg nightly -Continue doxazosin 4 mg daily  ##) hypertension: -Continue metoprolol, hold benazepril in setting of AK I  ##) Hyperlipidemia: -Continue simvastatin 20 mg daily  ##) Psych: -Continue bupropion 150 mg daily -Continue as needed tramadol 50 mg twice Ronald [name] day  ##) Osteoporosis: - Continue alendronate 70 mg q. 2 weeks (per GI, consider holding this at d/c)  ## Palliative Care c/s for goals of care with worsening renal function  DVT prophylaxis: SCD Code Status: full  Family Communication: discussed with son 2/16 Disposition Plan: pending improvement in resp status and follow H/H   Consultants:   GI  Procedures:   none  Antimicrobials:  Anti-infectives (From admission, onward)   None        Subjective: Feels ok. No complaints.  Objective: Vitals:   04/13/17 2014 04/14/17 0549 04/14/17 0900 04/14/17 1431  BP: (!) 151/58 (!) 153/68  107/87  Pulse: 69 61  60  Resp: 18 18  18   Temp: 98.4 F (36.9 C) 97.8 F (36.6 C)  98.2 F (36.8 C)  TempSrc: Oral Oral  Oral  SpO2: 95% 96% 98% 98%  Weight:  78.6 kg (173 lb 4.8 oz)    Height:        Intake/Output Summary (Last 24 hours) at 04/14/2017 1846 Last data filed at 04/14/2017 1845 Gross per 24 hour  Intake 200 ml  Output 700 ml  Net -500 ml   Filed Weights   04/12/17 0442 04/13/17 0513 04/14/17 0549  Weight: 82.7 kg (182 lb 4.8 oz) 78.5 kg (173 lb 1.6 oz) 78.6 kg (173 lb 4.8 oz)     Examination:  General: No acute distress. Cardiovascular: Heart sounds show Ronald [name] regular rate, and rhythm. No gallops or rubs. No murmurs. No JVD. Lungs: Clear to auscultation bilaterally with good air movement. No rales, rhonchi or wheezes. Abdomen: Soft, nontender, nondistended with normal active bowel sounds. No masses. No hepatosplenomegaly. Neurological: Alert and oriented 3. Moves all extremities 4. Cranial nerves II through XII grossly intact. Skin: Warm and dry. No rashes or lesions. Extremities: No clubbing or cyanosis. Mild dependent edema.  Psychiatric: Mood and affect are normal. Insight and judgment are appropriate.   Data Reviewed: I have personally reviewed following labs and imaging studies  CBC: Recent Labs  Lab 04/10/17 0251  04/11/17 0405  04/12/17 0426 04/13/17 0444 04/13/17 1609 04/14/17 0511 04/14/17 1338  WBC 7.0  --  7.0  --  7.1 6.4  --  6.5  --   HGB 7.6*   < > 9.0*  9.2*   < > 8.5* 8.1* 8.1* 7.8* 8.2*  HCT 23.1*   < > 26.9*  27.3*   < > 25.4* 24.7* 24.9* 23.7* 24.7*  MCV  91.7  --  90.9  --  91.4 92.2  --  91.2  --   PLT 126*  --  102*  --  113* 98*  --  95*  --    < > = values in this interval not displayed.   Basic Metabolic Panel: Recent Labs  Lab 04/10/17 0251 04/11/17 0405 04/12/17 0426 04/13/17 0444 04/14/17 0511  NA 143 141 139 139 138  K 4.5 3.9 4.1 3.9 3.9  CL 109 107 106 106 105  CO2 23 23 24 24 24   GLUCOSE 111* 59* 129* 136* 110*  BUN 55* 52* 56* 65* 75*  CREATININE 3.51* 3.57* 3.91* 4.38* 4.67*  CALCIUM 8.6* 8.4* 8.2* 8.1* 8.0*  MG  --   --  2.4  --   --    GFR: Estimated Creatinine Clearance: 10.8 mL/min (Lenor Provencher) (by C-G formula based on SCr of 4.67 mg/dL (H)). Liver Function Tests: Recent Labs  Lab 04/11/17 0405 04/14/17 0511  AST 13* 13*  ALT 13* 11*  ALKPHOS 85 71  BILITOT 1.0 0.5  PROT 6.3* 5.7*  ALBUMIN 2.8* 2.5*   No results for input(s): LIPASE, AMYLASE in the last 168 hours. No results for input(s):  AMMONIA in the last 168 hours. Coagulation Profile: Recent Labs  Lab 04/10/17 0402 04/11/17 0405 04/12/17 1011 04/13/17 0444 04/14/17 0511  INR 4.86* 4.90* 4.79* 3.48 2.62   Cardiac Enzymes: No results for input(s): CKTOTAL, CKMB, CKMBINDEX, TROPONINI in the last 168 hours. BNP (last 3 results) No results for input(s): PROBNP in the last 8760 hours. HbA1C: No results for input(s): HGBA1C in the last 72 hours. CBG: Recent Labs  Lab 04/13/17 1653 04/13/17 2040 04/14/17 0809 04/14/17 1129 04/14/17 1618  GLUCAP 151* 216* 113* 184* 148*   Lipid Profile: No results for input(s): CHOL, HDL, LDLCALC, TRIG, CHOLHDL, LDLDIRECT in the last 72 hours. Thyroid Function Tests: No results for input(s): TSH, T4TOTAL, FREET4, T3FREE, THYROIDAB in the last 72 hours. Anemia Panel: Recent Labs    04/12/17 1648  VITAMINB12 577  FOLATE 10.3  FERRITIN 77  TIBC 162*  IRON 35*   Sepsis Labs: No results for input(s): PROCALCITON, LATICACIDVEN in the last 168 hours.  Recent Results (from the past 240 hour(s))  MRSA PCR Screening     Status: None   Collection Time: 04/10/17  2:43 PM  Result Value Ref Range Status   MRSA by PCR NEGATIVE NEGATIVE Final    Comment:        The GeneXpert MRSA Assay (FDA approved for NASAL specimens only), is one component of [facility] Edd[facility] comprehensive MRSA colonization surveillance program. It is not intended to diagnose MRSA infection nor to guide or monitor treatment for MRSA infections. Performed at [facility], [place] [address], [place], [place] [address]          Radiology Studies: Dg Chest Port 1 View  Result Date: 04/13/2017 CLINICAL DATA:  Hypoxia. EXAM: PORTABLE CHEST 1 VIEW COMPARISON:  04/12/2017 FINDINGS: Lungs are adequately inflated and demonstrate continued hazy perihilar bibasilar opacification likely persistent moderate interstitial edema and possible small bilateral pleural effusions. Stable cardiomegaly. Remainder the  exam is unchanged. IMPRESSION: Stable cardiomegaly and persistent moderate interstitial edema. Likely small amount of bilateral pleural fluid. Electronically Signed   By: Marin Olp M.D.   On: 04/13/2017 08:05        Scheduled Meds: . buPROPion  150 mg Oral Daily  . doxazosin  4 mg Oral Daily  . ferrous sulfate  325  mg Oral BID  . finasteride  5 mg Oral QHS  . fluticasone  1 spray Each Nare BID  . insulin aspart  0-5 Units Subcutaneous QHS  . insulin aspart  0-9 Units Subcutaneous TID WC  . loratadine  10 mg Oral Daily  . Melatonin  5 mg Oral QHS  . metoprolol succinate  50 mg Oral BID  . pantoprazole  40 mg Oral Daily  . simvastatin  20 mg Oral QPM  . sodium chloride flush  3 mL Intravenous Q12H  . umeclidinium-vilanterol  1 puff Inhalation Daily   Continuous Infusions: . sodium chloride       LOS: 4 days    Time spent: over 20 min    Fayrene Helper, MD [facility] Pager [phone]  If 7PM-7AM, please contact night-coverage [email or web address] Password [facility] 04/14/2017, 6:46 PM

## 2017-04-14 NOTE — Progress Notes (Signed)
[facility] ROUNDING NOTE   Subjective:   This is a delightful 84 year old man that is from assisted living although according to [place] his daughter has had a decline function since November  He was admitted with congestive heart failure with what appears to be diastolic dysfunction and normal ejection fraction   He has bovine aortic valve and atrial fibrillation   His baseline renal function appears to be about 1.5 and was admitted with a creatinine of 3.5 As an outpatient he was taking an ACE benazepril according to my records. Renal ultrasound is unrevealing,  He appears to have been transfused [facility] 2/14 and does not appear to have received any Non steroidals   He does smoke 1 PPD  He has had a great diuresis with IV lasix although this is not reflected in weights and suspect that this is due to additional mechanical weight of the care items  He diureses 2 L and appears comfortable on oxygen\ Urinalysis showed some proteinuria and hematuria and this is concerning for possible intrinsic renal disease and possible glomerulonephritis  His blood pressure has been controlled with no evidence of hypotension  No complaints this am.  Cough better, SOB better.  Creat is up to 4.67, 675 cc UOP yesterday.     Objective:  Vital signs in last 24 hours:  Temp:  [97.8 F (36.6 C)-98.4 F (36.9 C)] 98.2 F (36.8 C) (02/18 1431) Pulse Rate:  [60-69] 60 (02/18 1431) Resp:  [18] 18 (02/18 1431) BP: (107-153)/(58-87) 107/87 (02/18 1431) SpO2:  [95 %-98 %] 98 % (02/18 1431) Weight:  [78.6 kg (173 lb 4.8 oz)] 78.6 kg (173 lb 4.8 oz) (02/18 0549)  Weight change: 0.091 kg (3.2 oz) Filed Weights   04/12/17 0442 04/13/17 0513 04/14/17 0549  Weight: 82.7 kg (182 lb 4.8 oz) 78.5 kg (173 lb 1.6 oz) 78.6 kg (173 lb 4.8 oz)    Intake/Output: I/O last 3 completed shifts: In: 315 [P.O.:830; I.V.:3] Out: 1075 [Urine:1075]   Intake/Output this shift:  Total I/O In: 200 [P.O.:200] Out: 100  [Urine:100]  Dry mucus membranes Neck:  Supple; no masses or thyromegaly. JVP not elevated Lungs:  Clear throughout to auscultation.   No wheezes, crackles, or rhonchi. No acute distress. Heart:  Regular rate and rhythm; systolic murmur Abdomen:  Soft, nontender and nondistended. No masses, hepatosplenomegaly or hernias noted. Normal bowel sounds, without guarding, and without rebound.   Pulses:  No carotid, renal, femoral bruits. DP and PT symmetrical and equal Extremities:  Without clubbing or edema.   Basic Metabolic Panel: Recent Labs  Lab 04/10/17 0251 04/11/17 0405 04/12/17 0426 04/13/17 0444 04/14/17 0511  NA 143 141 139 139 138  K 4.5 3.9 4.1 3.9 3.9  CL 109 107 106 106 105  CO2 23 23 24 24 24   GLUCOSE 111* 59* 129* 136* 110*  BUN 55* 52* 56* 65* 75*  CREATININE 3.51* 3.57* 3.91* 4.38* 4.67*  CALCIUM 8.6* 8.4* 8.2* 8.1* 8.0*  MG  --   --  2.4  --   --     Liver Function Tests: Recent Labs  Lab 04/11/17 0405 04/14/17 0511  AST 13* 13*  ALT 13* 11*  ALKPHOS 85 71  BILITOT 1.0 0.5  PROT 6.3* 5.7*  ALBUMIN 2.8* 2.5*   No results for input(s): LIPASE, AMYLASE in the last 168 hours. No results for input(s): AMMONIA in the last 168 hours.  CBC: Recent Labs  Lab 04/10/17 0251  04/11/17 0405  04/12/17  7341 04/13/17 0444 04/13/17 1609 04/14/17 0511 04/14/17 1338  WBC 7.0  --  7.0  --  7.1 6.4  --  6.5  --   HGB 7.6*   < > 9.0*  9.2*   < > 8.5* 8.1* 8.1* 7.8* 8.2*  HCT 23.1*   < > 26.9*  27.3*   < > 25.4* 24.7* 24.9* 23.7* 24.7*  MCV 91.7  --  90.9  --  91.4 92.2  --  91.2  --   PLT 126*  --  102*  --  113* 98*  --  95*  --    < > = values in this interval not displayed.    Cardiac Enzymes: No results for input(s): CKTOTAL, CKMB, CKMBINDEX, TROPONINI in the last 168 hours.  BNP: Invalid input(s): POCBNP  CBG: Recent Labs  Lab 04/13/17 0749 04/13/17 1143 04/13/17 1653 04/13/17 2040 04/14/17 1129  GLUCAP 117* 165* 151* 216* 184*     Microbiology: Results for orders placed or performed during the hospital encounter of 04/10/17  MRSA PCR Screening     Status: None   Collection Time: 04/10/17  2:43 PM  Result Value Ref Range Status   MRSA by PCR NEGATIVE NEGATIVE Final    Comment:        The GeneXpert MRSA Assay (FDA approved for NASAL specimens only), is one component of a comprehensive MRSA colonization surveillance program. It is not intended to diagnose MRSA infection nor to guide or monitor treatment for MRSA infections. Performed at [facility], [place] [address], [place], [place] [address]     Coagulation Studies: Recent Labs    04/12/17 1011 04/13/17 0444 04/14/17 0511  LABPROT 44.6* 34.7* 27.8*  INR 4.79* 3.48 2.62    Urinalysis: No results for input(s): COLORURINE, LABSPEC, PHURINE, GLUCOSEU, HGBUR, BILIRUBINUR, KETONESUR, PROTEINUR, UROBILINOGEN, NITRITE, LEUKOCYTESUR in the last 72 hours.  Invalid input(s): APPERANCEUR    Imaging: Dg Chest Port 1 View  Result Date: 04/13/2017 CLINICAL DATA:  Hypoxia. EXAM: PORTABLE CHEST 1 VIEW COMPARISON:  04/12/2017 FINDINGS: Lungs are adequately inflated and demonstrate continued hazy perihilar bibasilar opacification likely persistent moderate interstitial edema and possible small bilateral pleural effusions. Stable cardiomegaly. Remainder the exam is unchanged. IMPRESSION: Stable cardiomegaly and persistent moderate interstitial edema. Likely small amount of bilateral pleural fluid. Electronically Signed   By: Marin Olp M.D.   On: 04/13/2017 08:05     Medications:   . sodium chloride     . buPROPion  150 mg Oral Daily  . doxazosin  4 mg Oral Daily  . ferrous sulfate  325 mg Oral BID  . finasteride  5 mg Oral QHS  . fluticasone  1 spray Each Nare BID  . insulin aspart  0-5 Units Subcutaneous QHS  . insulin aspart  0-9 Units Subcutaneous TID WC  . loratadine  10 mg Oral Daily  . Melatonin  5 mg Oral QHS  . metoprolol  succinate  50 mg Oral BID  . pantoprazole  40 mg Oral Daily  . simvastatin  20 mg Oral QPM  . sodium chloride flush  3 mL Intravenous Q12H  . umeclidinium-vilanterol  1 puff Inhalation Daily   acetaminophen **OR** acetaminophen, albuterol, ondansetron **OR** ondansetron (ZOFRAN) IV, polyethylene glycol, traMADol  Assessment/ Plan:  1. Acute kidney injury - patient on ACE as outpatient but no hypotension noted  Increased pulmonary infiltrates and a concern of hematuria and proteinuria noted on routine lab testing There is definitely a concern of pulmonary renal  syndrome and the possibility of ANCA vasculitis exists.  There is also the possibility that there is a hematological process with the thrombocytopenia and that anemia that exists in the complete blood count. There is no obstruction and the pressure has been good with no evidence of hypotension. Intrinsic renal disease is a real possibility and I agree with Dr Florene Glen and would send serologies . However I am not sure that there would be ideal therapy due to advanced age and comorbid disease as well as his overall wishes that would be consistent with a more conservative approach and no dialysis. We are still waiting of serology 2. Volume - appears to be hypovolemic and agree with continuted diuretic holiday, would not use IV fluids  3. Anemia - looks like anemia of chronic disease and will give some Darbepoietin  Thank you for a very interesting consult and we shall continue to follow up with you on the lab results / please consult palliative medicine to help in the care of this patient.  Poor dialysis candidate given his age and comorbidities, steroids would be a consideration empirically but he has DM which could complicate the matter.  Have d/w pt's son and w/ the patient.  Prognosis guarded.     Kelly Splinter MD [employer] pgr [phone]   04/14/2017, 5:05 PM

## 2017-04-14 NOTE — Clinical Social Work Note (Signed)
Clinical Social Work Assessment  Patient Details  Name: Ronald [name] MRN: [id number] Date of Birth: [birth year]  Date of referral:  04/14/17               Reason for consult:  Discharge Planning(admitted from facility)                Permission sought to share information with:  Family Supports Permission granted to share information::  Yes, Verbal Permission Granted  Name::     son [name], daugher Hippe  Agency::  [facility]  Relationship::     Contact Information:     Housing/Transportation Living arrangements for the past 2 months:  [place] of Information:  Adult Children, Facility Patient Interpreter Needed:  None Criminal Activity/Legal Involvement Pertinent to Current Situation/Hospitalization:  No - Comment as needed Significant Relationships:  Adult Children, Warehouse manager Lives with:  Facility Resident Do you feel safe going back to the place where you live?  Yes Need for family participation in patient care:  Yes (Comment)(children assist with care decisions)  Care giving concerns:  Pt resides at [facility], moved there fall of 2018, lived at home prior to that.  Son states pt needs max assistance at ALF- " We have the Plus Package so that he gets extra support and care." Describes pt as needing assistance with transfers, sometimes ambulating, bathing, and meals.  Son reports that pt was recently admitted to [facility] 02/2017 for respiratory and heart failure issues. States that at that time PT recommendation was for SNF however pt functioning close to his baseline at ALF and pt was able to return directly home to ALF then- they are hoping for same situation this time but would consider SNF if ALF feels it is necessary.     Social Worker assessment / plan:  CSW consulted to assist with disposition plan.  Pt is resident at [facility] where he receives additional in-house support per son. Family  would prefer for him to go directly home to ALF and receive HHPT if needed, but are awaiting input from [facility] caretakers as well. CSW left voicemail for Wellness Office at [facility] and will follow up again to tomorrow.  Plan: TBD- return to ALF versus seeking SNF placement for ST rehab  Employment status:  Retired Nurse, adult PT Recommendations:  [place], Home with [place] / Referral to community resources:     Patient/Family's Response to care:  Family appreciative and engaged  Patient/Family's Understanding of and Emotional Response to Diagnosis, Current Treatment, and Prognosis:  Demonstrates good understanding of treatment and potential plans. Emotionally calm and well adjusted.   Emotional Assessment Appearance:  Appears stated age Attitude/Demeanor/Rapport:  (appropriate) Affect (typically observed):  Calm Orientation:  Oriented to Self, Oriented to Place Alcohol / Substance use:  Not Applicable Psych involvement (Current and /or in the community):  No (Comment)  Discharge Needs  Concerns to be addressed:  Decision making concerns, Care Coordination Readmission within the last 30 days:  Yes Current discharge risk:  (still assessing) Barriers to Discharge:  Continued Medical Work up   [employer], LCSW 04/14/2017, 5:13 PM  [phone]

## 2017-04-15 DIAGNOSIS — N183 Chronic kidney disease, stage 3 (moderate): Secondary | ICD-10-CM

## 2017-04-15 DIAGNOSIS — Z7189 Other specified counseling: Secondary | ICD-10-CM

## 2017-04-15 DIAGNOSIS — Z515 Encounter for palliative care: Secondary | ICD-10-CM

## 2017-04-15 DIAGNOSIS — N179 Acute kidney failure, unspecified: Secondary | ICD-10-CM

## 2017-04-15 LAB — PROTEIN ELECTROPHORESIS, SERUM
A/G Ratio: 0.8 (ref 0.7–1.7)
ALBUMIN ELP: 2.6 g/dL — AB (ref 2.9–4.4)
Alpha-1-Globulin: 0.3 g/dL (ref 0.0–0.4)
Alpha-2-Globulin: 0.6 g/dL (ref 0.4–1.0)
BETA GLOBULIN: 1.2 g/dL (ref 0.7–1.3)
GAMMA GLOBULIN: 1.2 g/dL (ref 0.4–1.8)
Globulin, Total: 3.2 g/dL (ref 2.2–3.9)
TOTAL PROTEIN ELP: 5.8 g/dL — AB (ref 6.0–8.5)

## 2017-04-15 LAB — COMPREHENSIVE METABOLIC PANEL
ALK PHOS: 74 U/L (ref 38–126)
ALT: 11 U/L — AB (ref 17–63)
AST: 15 U/L (ref 15–41)
Albumin: 2.6 g/dL — ABNORMAL LOW (ref 3.5–5.0)
Anion gap: 13 (ref 5–15)
BUN: 79 mg/dL — AB (ref 6–20)
CALCIUM: 8.1 mg/dL — AB (ref 8.9–10.3)
CHLORIDE: 106 mmol/L (ref 101–111)
CO2: 20 mmol/L — AB (ref 22–32)
CREATININE: 5.24 mg/dL — AB (ref 0.61–1.24)
GFR calc non Af Amer: 8 mL/min — ABNORMAL LOW (ref >60)
GFR, EST AFRICAN AMERICAN: 10 mL/min — AB (ref >60)
Glucose, Bld: 156 mg/dL — ABNORMAL HIGH (ref 65–99)
Potassium: 4.3 mmol/L (ref 3.5–5.1)
SODIUM: 139 mmol/L (ref 135–145)
Total Bilirubin: 0.5 mg/dL (ref 0.3–1.2)
Total Protein: 5.6 g/dL — ABNORMAL LOW (ref 6.5–8.1)

## 2017-04-15 LAB — URINALYSIS, ROUTINE W REFLEX MICROSCOPIC
BILIRUBIN URINE: NEGATIVE
Glucose, UA: 50 mg/dL — AB
KETONES UR: NEGATIVE mg/dL
Nitrite: NEGATIVE
PH: 5 (ref 5.0–8.0)
Protein, ur: 30 mg/dL — AB
SPECIFIC GRAVITY, URINE: 1.013 (ref 1.005–1.030)

## 2017-04-15 LAB — C3 COMPLEMENT: C3 Complement: 90 mg/dL (ref 82–167)

## 2017-04-15 LAB — MPO/PR-3 (ANCA) ANTIBODIES: Myeloperoxidase Abs: <9 U/mL (ref 0.0–9.0)

## 2017-04-15 LAB — CBC
HCT: 23.7 % — ABNORMAL LOW (ref 39.0–52.0)
HEMOGLOBIN: 7.8 g/dL — AB (ref 13.0–17.0)
MCH: 30.1 pg (ref 26.0–34.0)
MCHC: 32.9 g/dL (ref 30.0–36.0)
MCV: 91.5 fL (ref 78.0–100.0)
PLATELETS: 89 10*3/uL — AB (ref 150–400)
RBC: 2.59 MIL/uL — AB (ref 4.22–5.81)
RDW: 17 % — ABNORMAL HIGH (ref 11.5–15.5)
WBC: 6.6 10*3/uL (ref 4.0–10.5)

## 2017-04-15 LAB — GLUCOSE, CAPILLARY
Glucose-Capillary: 135 mg/dL — ABNORMAL HIGH (ref 65–99)
Glucose-Capillary: 143 mg/dL — ABNORMAL HIGH (ref 65–99)
Glucose-Capillary: 181 mg/dL — ABNORMAL HIGH (ref 65–99)
Glucose-Capillary: 157 mg/dL — ABNORMAL HIGH (ref 65–99)

## 2017-04-15 LAB — HEMOGLOBIN AND HEMATOCRIT, BLOOD
HEMATOCRIT: 23.4 % — AB (ref 39.0–52.0)
Hemoglobin: 7.8 g/dL — ABNORMAL LOW (ref 13.0–17.0)

## 2017-04-15 LAB — PROTIME-INR
INR: 2.12
Prothrombin Time: 23.6 seconds — ABNORMAL HIGH (ref 11.4–15.2)

## 2017-04-15 LAB — C4 COMPLEMENT: Complement C4, Body Fluid: 23 mg/dL (ref 14–44)

## 2017-04-15 MED ORDER — METOPROLOL SUCCINATE ER 25 MG PO TB24
12.5000 mg | ORAL_TABLET | Freq: Two times a day (BID) | ORAL | Status: DC
  Administered 2017-04-15 – 2017-04-24 (×13): 12.5 mg via ORAL
  Filled 2017-04-15 (×13): qty 1

## 2017-04-15 NOTE — Progress Notes (Addendum)
PROGRESS NOTE    Ronald [name]  LOV:[id number] DOB: 13-Jun-1922 DOA: 04/10/2017 PCP: [name], MD   Brief Narrative:  Per HPI Ronald [name] is [name] 84 y.o. male with medical history significant of peripheral vascular disease status post vent placement, renal artery stenosis that is post angioplasty with stent and rotational atherectomy in 8841, diastolic heart failure (last echo 03/12/2017), stage III sent chronic kidney disease, hypertension, paroxysmal atrial fibrillation on warfarin, COPD, aortic stenosis status post bovine AV valve replacement in 01/07/2011, hyperlipidemia who comes in with increasing shortness of breath.  Patient is an extremely poor historian.  Patient reports that approximately 2 days ago he began to develop worsening shortness of breath at night.  He cannot tell me if this was when he was lying flat.  He does report some lower extremity edema.  He denies any cough, congestion, rhinorrhea, sputum, fevers, chest pain, abdominal pain.  He also reported several episodes of "dark tarry diarrhea during this time".  He denies any hematemesis.  He is last had colonoscopies and endoscopies in 2008 and 2005 respectively.  He does have [name] history of prior GI bleeding and [facility]  Assessment & Plan:   Active Problems:   Essential hypertension   PVD, Rt SFA PTA/HSRA 06/17/11   AS (aortic stenosis)- s/p tissue AVR 01/07/11   Thrombocytopenia (HCC)   Chronic atrial fibrillation (HCC)   COPD (chronic obstructive pulmonary disease) (HCC)   Acute on chronic diastolic (congestive) heart failure (HCC)   Chronic renal insufficiency, stage IV (severe) (HCC)   Diabetes mellitus type 2, uncontrolled (HCC)   Acute blood loss anemia   GI bleed   AKI (acute kidney injury) ([place])   Acute renal failure superimposed on stage 3 chronic kidney disease (HCC)   Goals of care, counseling/discussion   DNR (do not resuscitate) discussion   Palliative care encounter    ##)  Acute on chronic kidney injury:  Progressive worsening of kidney function since presentation.  At this point in time suspect likely 2/2 unknown GN.   - Appreciate nephrology recs (recommending palliative care, not HD candidate, see PN from 2/19) -diuresis on hold - GN labs notable for negative anca, anti GBM, C3, C4, ANA, Hep B/C.  SPEP notable for hypoalbuminemia, no evidence of monoclonal protein.  - UA with RBC's and protein, will repeat and add on culture (still pending collection) - follow GN labs (ANA, ANCA, antiGBM, SPEP, C3, C4, Hep B/C) - follow renal US (without hydronephrosis)  ##) Acute on chronic diastolic congestive heart failure:  CXR 2/17 with stable cardiomegaly and persistent moderate interstitial edema, small amount of pleural fluid Stable trace dependent edema, but now on room air Echo 03/12/17 with normal EF, mod pulm hypertension (not sufficient to allow eval of LV diastolic dysfunction) -Holding diuresis at this point given improved resp status, elevated creatinine - c/s nephrology, appreciate recommendations -Weight daily, strict ins and outs, 1.5 L fluid restriction, 2 g sodium restricted diet  Filed Weights   04/13/17 0513 04/14/17 0549 04/15/17 0611  Weight: 78.5 kg (173 lb 1.6 oz) 78.6 kg (173 lb 4.8 oz) 78.9 kg (174 lb)   ##) Acute lower GI bleed causing acute on chronic anemia: Poor historian.  Notes "dark stools" for [name] while, but with stable H/H not sure how significant his bleeding is.  Per GI at this point in time, will continue to monitor.  No plans for EGD at the present.  Will allow INR to drift down.  Will plan on continuing anticoagulation given his risk factors below without evidence of significant bleeding. - H/H relatively stable since transfusion (Hb 7.8 today has been waxing/waning since transfusion on 2/14 - it was 8.5 at midnight on 2/15) - slowly downtrending, follow closely (if continues to downtrend, may need to revisit plan for restarting  anticoagulation or touch base with GI again) - 2/14 - 1 unit pRBC -Continue pantoprazole PO daily  -GI consult appreciate recs (currently signed off, no plans for intervention at this time.  If it continues to downtrend, may need to discuss again with GI) -Continue ferrous sulfate 325 twice daily  -- daily CBC -- follow iron, ferritin, b12, folate  ##) Elevated INR: -Hold warfarin in setting of elevated INR and GI bleed - plan to allow this to drift down - slowly downtrending - resume if H/H stable tomorrow  ##) COPD: -Continue long-acting muscarinic/long-acting beta agonist -Continue as needed albuterol  ##) T2DM:  - hold lantus - Sliding scale insulin, before meals at bedtime -Hold home glyburide  ##) Proximal atrial fibrillation  Hx bioprosthetic AVR: -Decrease metop with bradycardia to 12.5 mg BID -holding warfarin.  If H/H continues to downtrend, would probably favor discontinuing warfarin in setting of worsening renal function and likely plans for comfort care.   ##) Benign prostatic hypertrophy: -Continue finasteride 5 mg nightly -Continue doxazosin 4 mg daily  ##) hypertension: -Continue metoprolol, hold benazepril in setting of AK I  ##) Hyperlipidemia: -Continue simvastatin 20 mg daily  ##) Psych: -Continue bupropion 150 mg daily -Continue as needed tramadol 50 mg twice [name] day  ##) Osteoporosis: - Will plan to d/c alendronate as GI noted potentially ulcerogenic (please ensure discontinued at discharge)  ## Palliative Care c/s for goals of care with worsening renal function  DVT prophylaxis: SCD Code Status: full  Family Communication: discussed with son 2/19 Disposition Plan: Likely discharge tomorrow, pending palliative care with family.  With worsening renal function, at risk for electrolyte abnormalities and uremia, etc.    Consultants:   GI  Procedures:   none  Antimicrobials:  Anti-infectives (From admission, onward)   None         Subjective: Feeling ok today.   Objective: Vitals:   04/15/17 1418 04/15/17 1604 04/15/17 1638 04/15/17 1701  BP: 126/64  (!) 140/53   Pulse: (!) 58  (!) 56 62  Resp: 16     Temp: 97.6 F (36.4 C)     TempSrc: Oral     SpO2: 97% 100% 100%   Weight:      Height:        Intake/Output Summary (Last 24 hours) at 04/15/2017 1909 Last data filed at 04/15/2017 1700 Gross per 24 hour  Intake 840 ml  Output 400 ml  Net 440 ml   Filed Weights   04/13/17 0513 04/14/17 0549 04/15/17 0611  Weight: 78.5 kg (173 lb 1.6 oz) 78.6 kg (173 lb 4.8 oz) 78.9 kg (174 lb)    Examination:  General: No acute distress. Cardiovascular: Heart sounds show Chiquita Heckert regular rate, and rhythm. No gallops or rubs. No murmurs. No JVD. Lungs: Clear to auscultation bilaterally with good air movement. No rales, rhonchi or wheezes. Abdomen: Soft, nontender, nondistended with normal active bowel sounds. No masses. No hepatosplenomegaly. Neurological: Alert and oriented 3. Moves all extremities 4. Cranial nerves II through XII grossly intact. Skin: Warm and dry. No rashes or lesions. Extremities: No clubbing or cyanosis. No edema.  Psychiatric: Mood and affect are normal. Insight and  judgment are appropriate.  Data Reviewed: I have personally reviewed following labs and imaging studies  CBC: Recent Labs  Lab 04/11/17 0405  04/12/17 0426 04/13/17 0444 04/13/17 1609 04/14/17 0511 04/14/17 1338 04/15/17 0427 04/15/17 1442  WBC 7.0  --  7.1 6.4  --  6.5  --  6.6  --   HGB 9.0*  9.2*   < > 8.5* 8.1* 8.1* 7.8* 8.2* 7.8* 7.8*  HCT 26.9*  27.3*   < > 25.4* 24.7* 24.9* 23.7* 24.7* 23.7* 23.4*  MCV 90.9  --  91.4 92.2  --  91.2  --  91.5  --   PLT 102*  --  113* 98*  --  95*  --  89*  --    < > = values in this interval not displayed.   Basic Metabolic Panel: Recent Labs  Lab 04/11/17 0405 04/12/17 0426 04/13/17 0444 04/14/17 0511 04/15/17 0427  NA 141 139 139 138 139  K 3.9 4.1 3.9 3.9 4.3  CL  107 106 106 105 106  CO2 23 24 24 24  20*  GLUCOSE 59* 129* 136* 110* 156*  BUN 52* 56* 65* 75* 79*  CREATININE 3.57* 3.91* 4.38* 4.67* 5.24*  CALCIUM 8.4* 8.2* 8.1* 8.0* 8.1*  MG  --  2.4  --   --   --    GFR: Estimated Creatinine Clearance: 9.7 mL/min (Gracielynn Birkel) (by C-G formula based on SCr of 5.24 mg/dL (H)). Liver Function Tests: Recent Labs  Lab 04/11/17 0405 04/14/17 0511 04/15/17 0427  AST 13* 13* 15  ALT 13* 11* 11*  ALKPHOS 85 71 74  BILITOT 1.0 0.5 0.5  PROT 6.3* 5.7* 5.6*  ALBUMIN 2.8* 2.5* 2.6*   No results for input(s): LIPASE, AMYLASE in the last 168 hours. No results for input(s): AMMONIA in the last 168 hours. Coagulation Profile: Recent Labs  Lab 04/11/17 0405 04/12/17 1011 04/13/17 0444 04/14/17 0511 04/15/17 0427  INR 4.90* 4.79* 3.48 2.62 2.12   Cardiac Enzymes: No results for input(s): CKTOTAL, CKMB, CKMBINDEX, TROPONINI in the last 168 hours. BNP (last 3 results) No results for input(s): PROBNP in the last 8760 hours. HbA1C: No results for input(s): HGBA1C in the last 72 hours. CBG: Recent Labs  Lab 04/14/17 1618 04/14/17 2152 04/15/17 0732 04/15/17 1126 04/15/17 1606  GLUCAP 148* 258* 135* 143* 181*   Lipid Profile: No results for input(s): CHOL, HDL, LDLCALC, TRIG, CHOLHDL, LDLDIRECT in the last 72 hours. Thyroid Function Tests: No results for input(s): TSH, T4TOTAL, FREET4, T3FREE, THYROIDAB in the last 72 hours. Anemia Panel: No results for input(s): VITAMINB12, FOLATE, FERRITIN, TIBC, IRON, RETICCTPCT in the last 72 hours. Sepsis Labs: No results for input(s): PROCALCITON, LATICACIDVEN in the last 168 hours.  Recent Results (from the past 240 hour(s))  MRSA PCR Screening     Status: None   Collection Time: 04/10/17  2:43 PM  Result Value Ref Range Status   MRSA by PCR NEGATIVE NEGATIVE Final    Comment:        The GeneXpert MRSA Assay (FDA approved for NASAL specimens only), is one component of Bailee Thall comprehensive MRSA  colonization surveillance program. It is not intended to diagnose MRSA infection nor to guide or monitor treatment for MRSA infections. Performed at [facility], [place] [address] [place], [place] [address]          Radiology Studies: No results found.      Scheduled Meds: . buPROPion  150 mg Oral Daily  . doxazosin  4 mg Oral Daily  . ferrous sulfate  325 mg Oral BID  . finasteride  5 mg Oral QHS  . fluticasone  1 spray Each Nare BID  . insulin aspart  0-5 Units Subcutaneous QHS  . insulin aspart  0-9 Units Subcutaneous TID WC  . loratadine  10 mg Oral Daily  . Melatonin  5 mg Oral QHS  . metoprolol succinate  12.5 mg Oral BID WC  . pantoprazole  40 mg Oral Daily  . simvastatin  20 mg Oral QPM  . sodium chloride flush  3 mL Intravenous Q12H  . umeclidinium-vilanterol  1 puff Inhalation Daily   Continuous Infusions: . sodium chloride       LOS: 5 days    Time spent: over 20 min    Fayrene Helper, MD [facility] Pager [phone]  If 7PM-7AM, please contact night-coverage [email or web address] Password [facility] 04/15/2017, 7:08 PM

## 2017-04-15 NOTE — Progress Notes (Signed)
Medications administered by student RN [id number] with supervision of Clinical Instructor Ildefonso Keaney MSN, RN-BC or patient's assigned RN.   

## 2017-04-15 NOTE — Progress Notes (Addendum)
[facility] ROUNDING NOTE   Subjective:   84 year old man that is from assisted living although according to [place] his daughter has had a decline function since November 2018  He was admitted with congestive heart failure with what appears to be diastolic dysfunction and normal ejection fraction   He has bovine aortic valve and atrial fibrillation   His baseline renal function appears to be about 1.5 and was admitted with a creatinine of 3.5 As an outpatient he was taking an ACE benazepril according to my records. Renal ultrasound is unrevealing,  He appears to have been transfused [facility] 2/14 and does not appear to have received any Non steroidals   He does smoke 1 PPD  He has had a great diuresis with IV lasix although this is not reflected in weights and suspect that this is due to additional mechanical weight of the care items  He diureses 2 L and appears comfortable on oxygen\ Urinalysis showed some proteinuria and hematuria and this is concerning for possible intrinsic renal disease and possible glomerulonephritis  His blood pressure has been controlled with no evidence of hypotension  No complaints this am.  Creat rising still and UOP declining, 300 cc yesterday. Creat 5.2 today.     Objective:  Vital signs in last 24 hours:  Temp:  [97.8 F (36.6 C)-98.5 F (36.9 C)] 97.8 F (36.6 C) (02/19 0611) Pulse Rate:  [60-64] 62 (02/19 0829) Resp:  [16-21] 16 (02/19 0829) BP: (107-168)/(47-87) 168/48 (02/19 0829) SpO2:  [96 %-100 %] 100 % (02/19 0829) Weight:  [78.9 kg (174 lb)] 78.9 kg (174 lb) (02/19 0611)  Weight change: 0.318 kg (11.2 oz) Filed Weights   04/13/17 0513 04/14/17 0549 04/15/17 0611  Weight: 78.5 kg (173 lb 1.6 oz) 78.6 kg (173 lb 4.8 oz) 78.9 kg (174 lb)    Intake/Output: I/O last 3 completed shifts: In: 50 [P.O.:420] Out: 850 [Urine:850]   Intake/Output this shift:  Total I/O In: -  Out: 50 [Urine:50]  Dry mucus membranes Neck:  Supple;  no masses or thyromegaly. JVP not elevated Lungs:  Clear throughout to auscultation.   No wheezes, crackles, or rhonchi. No acute distress. Heart:  Regular rate and rhythm; systolic murmur Abdomen:  Soft, nontender and nondistended. No masses, hepatosplenomegaly or hernias noted. Normal bowel sounds, without guarding, and without rebound.   Pulses:  No carotid, renal, femoral bruits. DP and PT symmetrical and equal Extremities:  Without clubbing or edema.   Basic Metabolic Panel: Recent Labs  Lab 04/11/17 0405 04/12/17 0426 04/13/17 0444 04/14/17 0511 04/15/17 0427  NA 141 139 139 138 139  K 3.9 4.1 3.9 3.9 4.3  CL 107 106 106 105 106  CO2 23 24 24 24  20*  GLUCOSE 59* 129* 136* 110* 156*  BUN 52* 56* 65* 75* 79*  CREATININE 3.57* 3.91* 4.38* 4.67* 5.24*  CALCIUM 8.4* 8.2* 8.1* 8.0* 8.1*  MG  --  2.4  --   --   --     Liver Function Tests: Recent Labs  Lab 04/11/17 0405 04/14/17 0511 04/15/17 0427  AST 13* 13* 15  ALT 13* 11* 11*  ALKPHOS 85 71 74  BILITOT 1.0 0.5 0.5  PROT 6.3* 5.7* 5.6*  ALBUMIN 2.8* 2.5* 2.6*   No results for input(s): LIPASE, AMYLASE in the last 168 hours. No results for input(s): AMMONIA in the last 168 hours.  CBC: Recent Labs  Lab 04/11/17 0405  04/12/17 7353 04/13/17 0444 04/13/17 1609 04/14/17 2992  04/14/17 1338 04/15/17 0427  WBC 7.0  --  7.1 6.4  --  6.5  --  6.6  HGB 9.0*  9.2*   < > 8.5* 8.1* 8.1* 7.8* 8.2* 7.8*  HCT 26.9*  27.3*   < > 25.4* 24.7* 24.9* 23.7* 24.7* 23.7*  MCV 90.9  --  91.4 92.2  --  91.2  --  91.5  PLT 102*  --  113* 98*  --  95*  --  89*   < > = values in this interval not displayed.    Cardiac Enzymes: No results for input(s): CKTOTAL, CKMB, CKMBINDEX, TROPONINI in the last 168 hours.  BNP: Invalid input(s): POCBNP  CBG: Recent Labs  Lab 04/14/17 0809 04/14/17 1129 04/14/17 1618 04/14/17 2152 04/15/17 0732  GLUCAP 113* 184* 148* 62* 135*    Microbiology: Results for orders placed or  performed during the hospital encounter of 04/10/17  MRSA PCR Screening     Status: None   Collection Time: 04/10/17  2:43 PM  Result Value Ref Range Status   MRSA by PCR NEGATIVE NEGATIVE Final    Comment:        The GeneXpert MRSA Assay (FDA approved for NASAL specimens only), is one component of a comprehensive MRSA colonization surveillance program. It is not intended to diagnose MRSA infection nor to guide or monitor treatment for MRSA infections. Performed at [facility], [place] [address] [place], [place] [address]     Coagulation Studies: Recent Labs    04/12/17 1011 04/13/17 0444 04/14/17 0511 04/15/17 0427  LABPROT 44.6* 34.7* 27.8* 23.6*  INR 4.79* 3.48 2.62 2.12    Urinalysis: Recent Labs    04/15/17 0201  COLORURINE YELLOW  LABSPEC 1.013  PHURINE 5.0  GLUCOSEU 50*  HGBUR LARGE*  BILIRUBINUR NEGATIVE  KETONESUR NEGATIVE  PROTEINUR 30*  NITRITE NEGATIVE  LEUKOCYTESUR LARGE*      Imaging: No results found.   Medications:   . sodium chloride     . buPROPion  150 mg Oral Daily  . doxazosin  4 mg Oral Daily  . ferrous sulfate  325 mg Oral BID  . finasteride  5 mg Oral QHS  . fluticasone  1 spray Each Nare BID  . insulin aspart  0-5 Units Subcutaneous QHS  . insulin aspart  0-9 Units Subcutaneous TID WC  . loratadine  10 mg Oral Daily  . Melatonin  5 mg Oral QHS  . metoprolol succinate  25 mg Oral BID WC  . pantoprazole  40 mg Oral Daily  . simvastatin  20 mg Oral QPM  . sodium chloride flush  3 mL Intravenous Q12H  . umeclidinium-vilanterol  1 puff Inhalation Daily   acetaminophen **OR** acetaminophen, albuterol, ondansetron **OR** ondansetron (ZOFRAN) IV, polyethylene glycol, traMADol  Assessment/ Plan:  1. Renal failure - progressive , suspect this is subacute, suspect a GN that is progressive.  Serologies not all back, but pt is not a candidate for aggressive immunosuppression so results of serologies won't really  matter.  Renal US showed no hydro, echogenic kidneys.  Suspect he has an autoimmune renal condition which is responsible for his "decline" since last November.  Steroids could be tried but unlikely to work and would have side effects.  Feel that there is a very poor prognosis.  Not HD candidate given advanced age and comorbidities.  Have d/w patient today and the son yesterday.  Suspect renal failure will continue to decline over the next several days.  Will consult  palliative care.  Have d/w primary MD, will follow peripherally from here.   2. Volume - looks euvolemic, lasix stopped.  3. Anemia - no new Rx recommended 4. HTN - on metop/ cardura  Kelly Splinter MD [employer] pgr [phone]   04/15/2017, 9:03 AM

## 2017-04-15 NOTE — Consult Note (Signed)
Consultation Note Date: 04/15/2017   Patient Name: Ronald [name]  DOB: [birth year]  MRN: [id number]  Age / Sex: 84 y.o., male  PCP: [name], MD Referring Physician: Elodia Florence., *  Reason for Consultation: Establishing goals of care  HPI/Patient Profile: 84 y.o. male  with past medical history of peripheral vascular disease, renal artery stenosis s/p angioplasty with stent and rotational atherectomy in 6333, diastolic heart failure, chronic kidney disease stage 3, hypertension, paroxysmal atrial fibrillation on warfarin, COPD, aortic stenosis s/p bovine AV valve replacement admitted on 04/10/2017 with SOB with CHF and worsening renal function. Reported decline since November. Has continued worsening renal function and renal concerned he could have an autoimmune component contributing to his renal failure. Not a candidate for dialysis and prognosis is poor.   Clinical Assessment and Goals of Care: I met today with [name] along with his son-in-law at bedside. Ronald [name] tells me that he has been told that his kidneys are failing and there is nothing more that can be done. He understands that his time is limited and he is very clear about his goals to 1) get out of the hospital and 2) get with his lawyers to get his estate/affairs in order. He is hyper-focused on these two goals. Son-in-law has been in touch with Ronald [name] attorney to get a meeting ASAP.   I introduced palliative care and explained that my goal is to help him reach his goals and focus on the things that are most important to him. I also explained that our goal is to make sure we have a goal to make sure he is taken care of and has everything he needs from a medical standpoint so that we can have a safe plan for him to discharge from the hospital. We did speak about return to [facility] with hospice in place given his continued  renal worsening. We discussed why hospice may be helpful to him at [facility]. [name] and his son-in-law agree this may be a good plan. I also discussed code status with him (but again this is difficult given his focus on d/c and financial affairs) and he tells me that he would not want resuscitation unless there is reasonable hope - I explained that there would not be reasonable hope given the fact we cannot fix his renal function. I was unable to get a straight answer so will readdress tomorrow.   Family trying to set time for [place] to meet tomorrow to address [name] and discharge plan. Plan to discuss prognosis, code status, and hospice options at this time.   Primary Decision Maker PATIENT    SUMMARY OF RECOMMENDATIONS   - Planning to meet with patient and his children tomorrow to discuss plan for discharge  Code Status/Advance Care Planning:  Full code - will try and address again tomorrow   Symptom Management:   None currently  Palliative Prophylaxis:   Delirium Protocol and Frequent Pain Assessment  Additional Recommendations (Limitations, Scope, Preferences):  Recommend comfort  and hospice - TBD  Psycho-social/Spiritual:   Desire for further Chaplaincy support:no  Additional Recommendations: Education on Hospice and Grief/Bereavement Support  Prognosis:   < 2 weeks is very likely  Discharge Planning: To Be Determined. Likely return to [name] with hospice.      Primary Diagnoses: Present on Admission: . Acute on chronic diastolic (congestive) heart failure ([place]) . Acute blood loss anemia . AKI (acute kidney injury) ([place]) . Chronic atrial fibrillation ([place]) . Chronic renal insufficiency, stage IV (severe) ([place]) . COPD (chronic obstructive pulmonary disease) ([place]) . Diabetes mellitus type 2, uncontrolled ([place]) . Essential hypertension . GI bleed . PVD, Rt SFA PTA/HSRA 06/17/11 . Thrombocytopenia ([place])   I have reviewed the medical  record, interviewed the patient and family, and examined the patient. The following aspects are pertinent.  Past Medical History:  Diagnosis Date  . AS (aortic stenosis)    bovine aortic valve replacement 01/07/11  . Bladder cancer [facility])    Bladder Cancer local  . Blood transfusion    w/hip operation  . Cellulitis of left lower extremity   . Chronic atrial fibrillation ([place])   . Chronic diastolic congestive heart failure ([place])   . Claudication in peripheral vascular disease ([place]) 06/17/2011  . COPD (chronic obstructive pulmonary disease) ([place])   . Depression wife died 4 years ago.    Marland Kitchen History of stomach ulcers ~ 1951  . HTN (hypertension)   . Neuropathy due to secondary diabetes ([place])   . Peripheral vascular disease ([place]) very poor circulation legs and feet ... stents right and left legs... done in dr j. Gwenlyn Found 's office.   . Pneumonia 07/25/11   left  . Recurrent upper respiratory infection (URI)    sinusitis  . Renal artery stenosis ([place]) 2006   renal artery stent  . S/P angioplasty with stent, diamond back rotational athrectomy Prox. Rt. SFA 06/17/2011 06/17/2011  . Shortness of breath   . Thrombocytopenia due to drugs    seen by Dr Inda Merlin plts [id number] no rx  . Type II diabetes mellitus ([place])    Social History   Socioeconomic History  . Marital status: Widowed    Spouse name: None  . Number of children: 3  . Years of education: None  . Highest education level: None  Social Needs  . Financial resource strain: None  . Food insecurity - worry: None  . Food insecurity - inability: None  . Transportation needs - medical: None  . Transportation needs - non-medical: None  Occupational History  . Occupation: Press photographer  Tobacco Use  . Smoking status: Current Every Day Smoker    Packs/day: 1.50    Years: 75.00    Pack years: 112.50    Types: Cigarettes  . Smokeless tobacco: Never Used  Substance and Sexual Activity  . Alcohol use: No    Alcohol/week: 1.2 oz    Types: 2  Cans of beer per week  . Drug use: No  . Sexual activity: No  Other Topics Concern  . None  Social History Narrative  . None   Family History  Problem Relation Age of Onset  . Heart disease Mother   . Cancer Brother        colon  . Stroke Father    Scheduled Meds: . buPROPion  150 mg Oral Daily  . doxazosin  4 mg Oral Daily  . ferrous sulfate  325 mg Oral BID  . finasteride  5 mg Oral QHS  . fluticasone  1 spray Each Nare BID  . insulin aspart  0-5 Units Subcutaneous QHS  . insulin aspart  0-9 Units Subcutaneous TID WC  . loratadine  10 mg Oral Daily  . Melatonin  5 mg Oral QHS  . metoprolol succinate  12.5 mg Oral BID WC  . pantoprazole  40 mg Oral Daily  . simvastatin  20 mg Oral QPM  . sodium chloride flush  3 mL Intravenous Q12H  . umeclidinium-vilanterol  1 puff Inhalation Daily   Continuous Infusions: . sodium chloride     PRN Meds:.acetaminophen **OR** acetaminophen, albuterol, ondansetron **OR** ondansetron (ZOFRAN) IV, polyethylene glycol, traMADol Allergies  Allergen Reactions  . Aspirin Anaphylaxis, Shortness Of Breath and Rash    "broke out in white welts; red blotches neck and face; windpipe closing; ~ 1962"   Review of Systems  Constitutional: Positive for activity change. Negative for appetite change.  Respiratory: Negative for shortness of breath.   Neurological: Positive for weakness.    Physical Exam  Constitutional: He is oriented to person, place, and time. He appears well-developed.  HENT:  Head: Normocephalic and atraumatic.  Cardiovascular: Bradycardia present.  Pulmonary/Chest: Effort normal. No accessory muscle usage. No tachypnea. No respiratory distress.  Abdominal: Normal appearance.  Neurological: He is alert and oriented to person, place, and time.  Oriented but can easily become fixated in conversation  Nursing note and vitals reviewed.   Vital Signs: BP (!) 168/48 (BP Location: Left Arm)   Pulse 62   Temp 97.8 F (36.6 C)  (Oral)   Resp 16   Ht 6' (1.829 m)   Wt 78.9 kg (174 lb)   SpO2 94%   BMI 23.60 kg/m  Pain Assessment: No/denies pain   Pain Score: 0-No pain   SpO2: SpO2: 94 % O2 Device:SpO2: 94 % O2 Flow Rate: .O2 Flow Rate (L/min): 2 L/min  IO: Intake/output summary:   Intake/Output Summary (Last 24 hours) at 04/15/2017 1409 Last data filed at 04/15/2017 1029 Gross per 24 hour  Intake 480 ml  Output 300 ml  Net 180 ml    LBM: Last BM Date: 04/15/17 Baseline Weight: Weight: 77.7 kg (171 lb 6.4 oz) Most recent weight: Weight: 78.9 kg (174 lb)     Palliative Assessment/Data: 40%     Time Total: 70 min  Greater than 50%  of this time was spent counseling and coordinating care related to the above assessment and plan.  Signed by: Vinie Sill, NP Palliative Medicine Team Pager # [phone] (M-F 8a-5p) Team Phone # [phone] (Nights/Weekends)

## 2017-04-15 NOTE — Consult Note (Addendum)
   [facility] Inpatient Consult   04/15/2017  Ronald [name] [facility] 1922-03-16 [id number]    Patient assessed for [place] Management program due to multiple hospitalizations.Spoke with inpatient RNCM. [name] was resting. Spoke with daughter, [name], about [facility] Care Management program.   [name] states [name] could benefit from the additional support while at  [facility]. Discussed multiple hospitalizations and CHF management. [facility] Care Management consent obtained. [facility] Care Management folder provided as well.   [name] states she would prefer to be contacted for post discharge calls at [phone].   Will await to assign to [place] team once disposition plans are more clear. Noted palliative notes are pending as well.  Will continue to follow. Discussed above with inpatient RNCM.  Marthenia Rolling, MSN-Ed, RN,BSN [facility] [phone]

## 2017-04-15 NOTE — Progress Notes (Addendum)
CSW following for assistance with disposition. Pt from [place]. Plan- family would like pt to return directly to ALF if feasible rather than rehab. Spoke with Devonia at [facility]- provided information from therapy note for review in order to have ALF's input re: pt's care needs and how far he is from baseline.  Devonia states RN from ALF may visit pt in hospital to assess also. Will follow.   Plan: TBD- return to ALF versus seeking SNF for rehab. Pending input from ALF and family  Sharren Bridge, MSW, LCSW Clinical Social Work 04/15/2017 [phone]   Spoke with pt's daughter [name]- she states they are interested in pursuing palliative route of care or hospice and are discussing with providers.  Discussed pt's care needs with ALF- anticipating return there at DC. On day of DC, will need scripts/DC summary with medications prior to 15:00 in order to have medications available at facility when pt returns. Will also need palliative/hospice orders if decided.   Will follow and assist- plan: anticipate return to [facility] at [place]- likely with palliative or hospice referral to follow if deemed appropriate.

## 2017-04-15 NOTE — Progress Notes (Signed)
Spoke with pt and [name] (Pt's daughter) concerning HINN 12 Non-covered Continued Stay. Also discussed pt going home with [facility], daughter [name] states pt was with [facility]. However, pt continued to work everyday at [employer]. Pt did not say he was going to go back to work at discharge. Daughter [name] continued with pt going home with Hospice. Explained that [facility] is partnered with [employer] [facility]. [name] states that is okay.  Referral given to [facility] and [employer].

## 2017-04-16 DIAGNOSIS — I480 Paroxysmal atrial fibrillation: Secondary | ICD-10-CM

## 2017-04-16 DIAGNOSIS — I10 Essential (primary) hypertension: Secondary | ICD-10-CM

## 2017-04-16 DIAGNOSIS — E1165 Type 2 diabetes mellitus with hyperglycemia: Secondary | ICD-10-CM

## 2017-04-16 DIAGNOSIS — N184 Chronic kidney disease, stage 4 (severe): Secondary | ICD-10-CM

## 2017-04-16 LAB — BASIC METABOLIC PANEL
ANION GAP: 12 (ref 5–15)
BUN: 92 mg/dL — ABNORMAL HIGH (ref 6–20)
CHLORIDE: 107 mmol/L (ref 101–111)
CO2: 22 mmol/L (ref 22–32)
Calcium: 8.4 mg/dL — ABNORMAL LOW (ref 8.9–10.3)
Creatinine, Ser: 5.65 mg/dL — ABNORMAL HIGH (ref 0.61–1.24)
GFR calc Af Amer: 9 mL/min — ABNORMAL LOW (ref >60)
GFR, EST NON AFRICAN AMERICAN: 8 mL/min — AB (ref >60)
Glucose, Bld: 145 mg/dL — ABNORMAL HIGH (ref 65–99)
POTASSIUM: 4.3 mmol/L (ref 3.5–5.1)
Sodium: 141 mmol/L (ref 135–145)

## 2017-04-16 LAB — GLUCOSE, CAPILLARY
Glucose-Capillary: 150 mg/dL — ABNORMAL HIGH (ref 65–99)
Glucose-Capillary: 210 mg/dL — ABNORMAL HIGH (ref 65–99)
Glucose-Capillary: 193 mg/dL — ABNORMAL HIGH (ref 65–99)
Glucose-Capillary: 151 mg/dL — ABNORMAL HIGH (ref 65–99)

## 2017-04-16 LAB — PROTIME-INR
INR: 1.46
Prothrombin Time: 17.7 seconds — ABNORMAL HIGH (ref 11.4–15.2)

## 2017-04-16 LAB — URINE CULTURE

## 2017-04-16 LAB — CBC
HCT: 21.2 % — ABNORMAL LOW (ref 39.0–52.0)
HEMOGLOBIN: 7.3 g/dL — AB (ref 13.0–17.0)
MCH: 31.5 pg (ref 26.0–34.0)
MCHC: 34.4 g/dL (ref 30.0–36.0)
MCV: 91.4 fL (ref 78.0–100.0)
Platelets: 88 10*3/uL — ABNORMAL LOW (ref 150–400)
RBC: 2.32 MIL/uL — AB (ref 4.22–5.81)
RDW: 17 % — ABNORMAL HIGH (ref 11.5–15.5)
WBC: 5.6 10*3/uL (ref 4.0–10.5)

## 2017-04-16 MED ORDER — PENTAFLUOROPROP-TETRAFLUOROETH EX AERO: 1.0000 "application " | INHALATION_SPRAY | CUTANEOUS | Status: DC | PRN

## 2017-04-16 MED ORDER — SODIUM CHLORIDE 0.9 % IV SOLN: 100.0000 mL | INTRAVENOUS | Status: DC | PRN

## 2017-04-16 MED ORDER — HEPARIN SODIUM (PORCINE) 1000 UNIT/ML DIALYSIS: 2000.0000 [IU] | INTRAMUSCULAR | Status: DC | PRN

## 2017-04-16 MED ORDER — LIDOCAINE-PRILOCAINE 2.5-2.5 % EX CREA: 1.0000 "application " | TOPICAL_CREAM | CUTANEOUS | Status: DC | PRN

## 2017-04-16 MED ORDER — ALTEPLASE 2 MG IJ SOLR: 2.0000 mg | Freq: Once | INTRAMUSCULAR | Status: DC | PRN

## 2017-04-16 MED ORDER — OXYMETAZOLINE HCL 0.05 % NA SOLN
2.0000 | NASAL | Status: DC
  Administered 2017-04-16 – 2017-04-25 (×4): 2 via NASAL
  Filled 2017-04-16 (×2): qty 15

## 2017-04-16 MED ORDER — CEFAZOLIN SODIUM-DEXTROSE 2-4 GM/100ML-% IV SOLN
2.0000 g | INTRAVENOUS | Status: AC
  Filled 2017-04-16: qty 100

## 2017-04-16 MED ORDER — SODIUM CHLORIDE 0.9 % IV SOLN
125.0000 mg | INTRAVENOUS | Status: AC
  Administered 2017-04-21 – 2017-04-23 (×2): 125 mg via INTRAVENOUS
  Filled 2017-04-16 (×7): qty 10

## 2017-04-16 MED ORDER — HEPARIN SODIUM (PORCINE) 1000 UNIT/ML DIALYSIS: 1000.0000 [IU] | INTRAMUSCULAR | Status: DC | PRN

## 2017-04-16 MED ORDER — LIDOCAINE HCL (PF) 1 % IJ SOLN: 5.0000 mL | INTRAMUSCULAR | Status: DC | PRN

## 2017-04-16 NOTE — Progress Notes (Signed)
Called to patient room by [id number], pt noted with perfuse nose bleed to right nostril.  Blood tinkling down right nare. Pressure applied to right nostril for approximately 15-20 minutes. MD notified and orders received. Will stay with patient and monitor. Family at bedside assisted with additional pressure application. Medication given in nostril as directed. Continued to apply pressure. After about 15 minutes nose bleeding stopped. Applied humidification to oxygen . Will cont to monitor and hand off to night RN. SRP, RN

## 2017-04-16 NOTE — Progress Notes (Addendum)
[facility] ROUNDING NOTE   Subjective:  Patient w/o complaints.     Objective:  Vital signs in last 24 hours:  Temp:  [97.6 F (36.4 C)-98.4 F (36.9 C)] 97.9 F (36.6 C) (02/20 0522) Pulse Rate:  [56-64] 61 (02/20 0522) Resp:  [16-20] 18 (02/20 0522) BP: (126-153)/(53-64) 145/54 (02/20 0522) SpO2:  [97 %-100 %] 98 % (02/20 0810) Weight:  [79.3 kg (174 lb 12.8 oz)] 79.3 kg (174 lb 12.8 oz) (02/20 0522)  Weight change: 0.363 kg (12.8 oz) Filed Weights   04/14/17 0549 04/15/17 0611 04/16/17 0522  Weight: 78.6 kg (173 lb 4.8 oz) 78.9 kg (174 lb) 79.3 kg (174 lb 12.8 oz)    Intake/Output: I/O last 3 completed shifts: In: 840 [P.O.:840] Out: 750 [Urine:750]   Intake/Output this shift:  No intake/output data recorded.  Dry mucus membranes Neck:  Supple; no masses or thyromegaly. JVP not elevated Lungs:  bilat crackles at bases Heart:  Regular rate and rhythm; systolic murmur Abdomen:  Soft, nontender and nondistended. No masses, hepatosplenomegaly or hernias noted. Normal bowel sounds, without guarding, and without rebound.   Pulses:  No carotid, renal, femoral bruits. DP and PT symmetrical and equal Extremities:  Without clubbing or edema.   Basic Metabolic Panel: Recent Labs  Lab 04/12/17 0426 04/13/17 0444 04/14/17 0511 04/15/17 0427 04/16/17 0443  NA 139 139 138 139 141  K 4.1 3.9 3.9 4.3 4.3  CL 106 106 105 106 107  CO2 24 24 24  20* 22  GLUCOSE 129* 136* 110* 156* 145*  BUN 56* 65* 75* 79* 92*  CREATININE 3.91* 4.38* 4.67* 5.24* 5.65*  CALCIUM 8.2* 8.1* 8.0* 8.1* 8.4*  MG 2.4  --   --   --   --     Liver Function Tests: Recent Labs  Lab 04/11/17 0405 04/14/17 0511 04/15/17 0427  AST 13* 13* 15  ALT 13* 11* 11*  ALKPHOS 85 71 74  BILITOT 1.0 0.5 0.5  PROT 6.3* 5.7* 5.6*  ALBUMIN 2.8* 2.5* 2.6*   No results for input(s): LIPASE, AMYLASE in the last 168 hours. No results for input(s): AMMONIA in the last 168  hours.  CBC: Recent Labs  Lab 04/12/17 0426 04/13/17 0444  04/14/17 0511 04/14/17 1338 04/15/17 0427 04/15/17 1442 04/16/17 0443  WBC 7.1 6.4  --  6.5  --  6.6  --  5.6  HGB 8.5* 8.1*   < > 7.8* 8.2* 7.8* 7.8* 7.3*  HCT 25.4* 24.7*   < > 23.7* 24.7* 23.7* 23.4* 21.2*  MCV 91.4 92.2  --  91.2  --  91.5  --  91.4  PLT 113* 98*  --  95*  --  89*  --  88*   < > = values in this interval not displayed.    Cardiac Enzymes: No results for input(s): CKTOTAL, CKMB, CKMBINDEX, TROPONINI in the last 168 hours.  BNP: Invalid input(s): POCBNP  CBG: Recent Labs  Lab 04/15/17 1126 04/15/17 1606 04/15/17 2048 04/16/17 0740 04/16/17 1152  GLUCAP 143* 181* 157* 150* 210*    Microbiology: Results for orders placed or performed during the hospital encounter of 04/10/17  MRSA PCR Screening     Status: None   Collection Time: 04/10/17  2:43 PM  Result Value Ref Range Status   MRSA by PCR NEGATIVE NEGATIVE Final    Comment:        The GeneXpert MRSA Assay (FDA approved for NASAL specimens only), is one component of a comprehensive MRSA  colonization surveillance program. It is not intended to diagnose MRSA infection nor to guide or monitor treatment for MRSA infections. Performed at [facility], [place] [address] [place], [place] [address]   Culture, Urine     Status: Abnormal   Collection Time: 04/15/17  2:01 AM  Result Value Ref Range Status   Specimen Description   Final    URINE, RANDOM Performed at [place] [address], [place], [place] [address]    Special Requests   Final    NONE Performed at [facility], [place] [address], [place],  [address]    Culture MULTIPLE SPECIES PRESENT, SUGGEST RECOLLECTION (A)  Final   Report Status 04/16/2017 FINAL  Final    Coagulation Studies: Recent Labs    04/14/17 0511 04/15/17 0427 04/16/17 0443  LABPROT 27.8* 23.6* 17.7*  INR 2.62 2.12 1.46     Urinalysis: Recent Labs    04/15/17 0201  COLORURINE YELLOW  LABSPEC 1.013  PHURINE 5.0  GLUCOSEU 50*  HGBUR LARGE*  BILIRUBINUR NEGATIVE  KETONESUR NEGATIVE  PROTEINUR 30*  NITRITE NEGATIVE  LEUKOCYTESUR LARGE*      Imaging: No results found.   Medications:   . sodium chloride     . buPROPion  150 mg Oral Daily  . doxazosin  4 mg Oral Daily  . ferrous sulfate  325 mg Oral BID  . finasteride  5 mg Oral QHS  . fluticasone  1 spray Each Nare BID  . insulin aspart  0-5 Units Subcutaneous QHS  . insulin aspart  0-9 Units Subcutaneous TID WC  . loratadine  10 mg Oral Daily  . Melatonin  5 mg Oral QHS  . metoprolol succinate  12.5 mg Oral BID WC  . pantoprazole  40 mg Oral Daily  . simvastatin  20 mg Oral QPM  . sodium chloride flush  3 mL Intravenous Q12H  . umeclidinium-vilanterol  1 puff Inhalation Daily   acetaminophen **OR** acetaminophen, albuterol, ondansetron **OR** ondansetron (ZOFRAN) IV, polyethylene glycol, traMADol  Assessment/ Plan:  1. Renal failure - progressive, suspected GN (+prot/ rbc's in urine) but serologies all negative.  Poor immunosuppression candidate.  Pt want's to do "something to give me more time".  Dialysis is the only reasonably safe option for this, have discussed the invasive nature of HD and potential complications with patient and his son.  He wants to give HD a try.  He is showing uremic signs w asterixis.  Will plan for [facility] placement, transfer to [facility] and plan to start HD tomorrow. Have d/w pall care, they will sign off for now.   2. Volume - sounds a bit wet 3. Anemia - sp darbe 100 ug on 2/16, will give 500 mg IV Fe over next several days; transfuse prn Hb < 7 4. HTN - on metop/ cardura  Kelly Splinter MD [employer] pgr [phone]   04/16/2017, 1:23 PM

## 2017-04-16 NOTE — Progress Notes (Addendum)
Continue to wait for bed confirmation, transfer to [facility] for Hemodialysis. Pt and family updated. SRP,RN

## 2017-04-16 NOTE — Progress Notes (Signed)
Daily Progress Note   Patient Name: Ronald [name]       Date: 04/16/2017 DOB: Jul 26, 1922  Age: 84 y.o. MRN#: [id number] Attending Physician: Bonnielee Haff, MD Primary Care Physician: [name], MD Admit Date: 04/10/2017  Reason for Consultation/Follow-up: Establishing goals of care  Subjective: Ronald [name] is sleeping today.   Length of Stay: 6  Current Medications: Scheduled Meds:  . buPROPion  150 mg Oral Daily  . doxazosin  4 mg Oral Daily  . ferrous sulfate  325 mg Oral BID  . finasteride  5 mg Oral QHS  . fluticasone  1 spray Each Nare BID  . insulin aspart  0-5 Units Subcutaneous QHS  . insulin aspart  0-9 Units Subcutaneous TID WC  . loratadine  10 mg Oral Daily  . Melatonin  5 mg Oral QHS  . metoprolol succinate  12.5 mg Oral BID WC  . pantoprazole  40 mg Oral Daily  . simvastatin  20 mg Oral QPM  . sodium chloride flush  3 mL Intravenous Q12H  . umeclidinium-vilanterol  1 puff Inhalation Daily    Continuous Infusions: . sodium chloride      PRN Meds: acetaminophen **OR** acetaminophen, albuterol, ondansetron **OR** ondansetron (ZOFRAN) IV, polyethylene glycol, traMADol  Physical Exam          Vital Signs: BP (!) 145/54 (BP Location: Left Arm)   Pulse 61   Temp 97.9 F (36.6 C) (Oral)   Resp 18   Ht 6' (1.829 m)   Wt 79.3 kg (174 lb 12.8 oz)   SpO2 98%   BMI 23.71 kg/m  SpO2: SpO2: 98 % O2 Device: O2 Device: Nasal Cannula O2 Flow Rate: O2 Flow Rate (L/min): 2 L/min  Intake/output summary:   Intake/Output Summary (Last 24 hours) at 04/16/2017 1159 Last data filed at 04/16/2017 0700 Gross per 24 hour  Intake 360 ml  Output 550 ml  Net -190 ml   LBM: Last BM Date: 04/16/17 Baseline Weight: Weight: 77.7 kg (171 lb 6.4 oz) Most recent weight:  Weight: 79.3 kg (174 lb 12.8 oz)       Palliative Assessment/Data:      Patient Active Problem List   Diagnosis Date Noted  . Acute renal failure superimposed on stage 3 chronic kidney disease ([place])   . Goals of care,  counseling/discussion   . DNR (do not resuscitate) discussion   . Palliative care encounter   . Acute respiratory distress 03/10/2017  . Skin excoriation 03/10/2017  . Closed fracture of left proximal humerus 12/26/2016  . Comminuted left humeral fracture 12/23/2016  . GI bleed 07/02/2016  . AKI (acute kidney injury) ([name]) 07/02/2016  . Lactic acidemia 07/02/2016  . Lower GI bleed   . Tobacco use 03/07/2015  . Diabetes mellitus type 2, uncontrolled ([place]) 12/20/2013  . Acute blood loss anemia 12/20/2013  . Allergic rhinitis 12/20/2013  . Intertrochanteric fracture of left femur ([place]) 12/12/2013  . Anemia 12/12/2013  . Right acetabular fracture ([place]) 04/11/2013  . Acetabular fracture ([place]) 04/11/2013  . Chronic renal insufficiency, stage IV (severe) ([place]) 12/24/2012  . Bilateral claudication of lower limb ([place]) 12/24/2012  . Cellulitis 12/02/2012  . History of tobacco abuse- 75 years, quit in Feb 2014 07/06/2012  . Acute on chronic diastolic (congestive) heart failure ([place]) 07/06/2012  . COPD (chronic obstructive pulmonary disease) ([place]) 05/27/2012  . Pulmonary nodules 05/27/2012  . Normal coronary arteries, cath 11/12 07/25/2011  . Normal left ventricular systolic function, Echo 6/30 07/25/2011  . Closed right hip fracture, ORIF 03/03/99 complicated by gluteal hematoma while being Coumadinized post op 06/27/2011  . Aspirin allergy, rash, SOB. Pt is on Plavix 06/19/2011  . Chronic atrial fibrillation ([place]) 06/19/2011  . Thrombocytopenia ([place]) 01/11/2011  . Leukocytosis 01/11/2011  . S/P aortic valve replacement: #23 Magna Ease Edwards Pericardial Valve  November 2012 01/08/2011  . AS (aortic stenosis)- s/p tissue AVR 01/07/11   . CAROTID BRUIT- moderate ICA  disease 5/14 05/31/2008  . Diabetes mellitus with complication ([place]) 09/32/3557  . Hyperlipidemia 07/11/2007  . DEPRESSION 07/11/2007  . RESTLESS LEGS SYNDROME 07/11/2007  . Essential hypertension 07/11/2007  . PVD, Rt SFA PTA/HSRA 06/17/11 07/11/2007  . DIVERTICULOSIS, COLON 07/11/2007  . RENAL CYST 07/11/2007  . ARTHRITIS 07/11/2007  . SKIN CANCER, HX OF 07/11/2007  . RHEUMATIC HEART DISEASE, HX OF 07/11/2007    Palliative Care Assessment & Plan   HPI: 84 y.o. male  with past medical history of peripheral vascular disease, renal artery stenosis s/p angioplasty with stent and rotational atherectomy in 3220, diastolic heart failure, chronic kidney disease stage 3, hypertension, paroxysmal atrial fibrillation on warfarin, COPD, aortic stenosis s/p bovine AV valve replacement admitted on 04/10/2017 with SOB with CHF and worsening renal function. Reported decline since November. Has continued worsening renal function and renal concerned he could have an autoimmune component contributing to his renal failure. Not a candidate for dialysis and prognosis is poor.    Assessment: Long discussion today with Ronald [name] 4 children and their spouses. We discussed renal failure, expectations, and goals of care. Discussed options such as transplant and dialysis and why these are not good options for him at length. Focused on the fact that Ronald [name] is currently asymptomatic and although this could change at any time I would fear the strain of dialysis could change this and effect the time he does have to focus on his goals (mainly updating his Will with his attorney). They all understand and agree but request that the renal provider explain to Ronald [name] why these are not good options as he has many questions this morning. They all agree that returning to [facility] with hospice care today is our best option. His main goal has been to update his Will and to d/c from the hospital. We also discussed  further DNR status and his  stated desire for DNR "if there is no reasonable hope" and they all agree that there would be no reasonable hope if it gets to this point so DNR placed. All questions/concerns addressed and emotional support provided.   Late entry: [name] has discussed with patient and son and they are pursuing dialysis at this time. Plans for hosp[facility] at [facility] cancelled. Palliative signing-off and please reconsult as needed.   Recommendations/Plan:  Plans for dialysis now. Palliative signing off.   Goals of Care and Additional Recommendations:  Limitations on Scope of Treatment: TBD  Code Status:  DNR  Prognosis:   Unable to determine. I continue to believe that prognosis is very poor with initiating dialysis at 84 yo with multiple comorbidities.   Discharge Planning:  To Be Determined  Care plan was discussed with [name], Dr. [place] Pink.   Thank you for allowing the Palliative Medicine Team to assist in the care of this patient.   Time In: 1030 Time Out: 1200 Total Time 90 min Prolonged Time Billed  yes       Greater than 50%  of this time was spent counseling and coordinating care related to the above assessment and plan.  Vinie Sill, NP Palliative Medicine Team Pager # [phone] (M-F 8a-5p) Team Phone # [phone] (Nights/Weekends)

## 2017-04-16 NOTE — Progress Notes (Signed)
[facility] here to transfer pt.  Report given to [facility].   Belongings and meds with pt.

## 2017-04-16 NOTE — Progress Notes (Signed)
Pt placement notifies pt will be going to [facility] in room 17. Report called and given to [facility].  Son [name] notified.  Belongings packed up.  10 PM meds given.  Paperwork printed and packet ready.  [facility] called and report given.

## 2017-04-16 NOTE — Progress Notes (Signed)
PT Cancellation Note  Patient Details Name: Ronald [name] MRN: [id number] DOB: Jul 13, 1922   Cancelled Treatment:    Reason Eval/Treat Not Completed: Other (comment) Pt awaiting lawyer's arrival to complete paperwork and declined to participate.  Lawyer still present upon checking back on pt at requested 4pm time.  Son reports pt to transfer to [facility] today to start HD tomorrow.   Chanson Teems,KATHrine E 04/16/2017, 4:10 PM Carmelia Bake, PT, DPT 04/16/2017 Pager: [phone]

## 2017-04-16 NOTE — Progress Notes (Signed)
Pt stable and resting without nose bleed, updated to night RN. SRP, RN

## 2017-04-16 NOTE — Consult Note (Addendum)
   [facility] Inpatient Consult   04/16/2017  [name] [facility] [birth year] [phone]   [facility] follow up.  Chart reviewed. Noted palliative medicine meeting was done with family. Appears [name] is transferring to [facility] to start HD.  Will make Ronald [name] referrals when/if appropriate. Will continue to follow.   Marthenia Rolling, MSN-Ed, RN,BSN [facility] Liaison [phone]

## 2017-04-16 NOTE — Progress Notes (Signed)
Called bed placement for transfer to [facility]. Await update. SRP, RN

## 2017-04-16 NOTE — Progress Notes (Signed)
Spoke to IR plan to complete the CV line insertion at [facility]. SRP,RN

## 2017-04-16 NOTE — Progress Notes (Addendum)
[facility] PROGRESS NOTE  Ronald [name] KKX:[id number] DOB: Jul 22, 1922 DOA: 04/10/2017  PCP: [name], MD  Brief History/Interval Summary: 84 y.o.malewith medical history significant ofperipheral vascular disease status post vent placement, renal artery stenosis that is post angioplasty with stent and rotational atherectomy in 1696, diastolic heart failure (last echo 03/12/2017), stage III chronic kidney disease, hypertension, paroxysmal atrial fibrillation on warfarin, COPD, aortic stenosis status post bovine AV valve replacement in 01/07/2011, hyperlipidemia who presented with increasing shortness of breath. He also reported several episodes of "dark tarry diarrhea". He denied any hematemesis. He last had colonoscopies and endoscopies in 2008 and 2005 respectively. He does have a history of prior GI bleeding and [facility]  Consultants: Gastroenterology.  Nephrology.  Palliative medicine  Procedures: None  Antibiotics: None  Subjective/Interval History: Patient sleepy this morning.  Denies any complaints.  ROS: Unable to do  Objective:  Vital Signs  Vitals:   04/16/17 0522 04/16/17 0810 04/16/17 1356 04/16/17 1439  BP: (!) 145/54  (!) 147/47   Pulse: 61  (!) 58   Resp: 18  18   Temp: 97.9 F (36.6 C)  97.6 F (36.4 C)   TempSrc: Oral  Axillary   SpO2: 97% 98% 95% 99%  Weight: 79.3 kg (174 lb 12.8 oz)     Height:        Intake/Output Summary (Last 24 hours) at 04/16/2017 1523 Last data filed at 04/16/2017 1410 Gross per 24 hour  Intake -  Output 650 ml  Net -650 ml   Filed Weights   04/14/17 0549 04/15/17 0611 04/16/17 0522  Weight: 78.6 kg (173 lb 4.8 oz) 78.9 kg (174 lb) 79.3 kg (174 lb 12.8 oz)    General appearance: appears stated age, distracted and no distress Resp: clear to auscultation bilaterally Cardio: regular rate and rhythm, S1, S2 normal, no murmur, click, rub or gallop GI: soft, non-tender; bowel sounds  normal; no masses,  no organomegaly Extremities: extremities normal, atraumatic, no cyanosis or edema Neurologic: Noted to be distracted.  But no obvious focal deficits noted.  Lab Results:  Data Reviewed: I have personally reviewed following labs and imaging studies  CBC: Recent Labs  Lab 04/12/17 0426 04/13/17 0444  04/14/17 0511 04/14/17 1338 04/15/17 0427 04/15/17 1442 04/16/17 0443  WBC 7.1 6.4  --  6.5  --  6.6  --  5.6  HGB 8.5* 8.1*   < > 7.8* 8.2* 7.8* 7.8* 7.3*  HCT 25.4* 24.7*   < > 23.7* 24.7* 23.7* 23.4* 21.2*  MCV 91.4 92.2  --  91.2  --  91.5  --  91.4  PLT 113* 98*  --  95*  --  89*  --  88*   < > = values in this interval not displayed.    Basic Metabolic Panel: Recent Labs  Lab 04/12/17 0426 04/13/17 0444 04/14/17 0511 04/15/17 0427 04/16/17 0443  NA 139 139 138 139 141  K 4.1 3.9 3.9 4.3 4.3  CL 106 106 105 106 107  CO2 24 24 24  20* 22  GLUCOSE 129* 136* 110* 156* 145*  BUN 56* 65* 75* 79* 92*  CREATININE 3.91* 4.38* 4.67* 5.24* 5.65*  CALCIUM 8.2* 8.1* 8.0* 8.1* 8.4*  MG 2.4  --   --   --   --     GFR: Estimated Creatinine Clearance: 9 mL/min (A) (by C-G formula based on SCr of 5.65 mg/dL (H)).  Liver Function Tests: Recent Labs  Lab 04/11/17 0405  04/14/17 0511 04/15/17 0427  AST 13* 13* 15  ALT 13* 11* 11*  ALKPHOS 85 71 74  BILITOT 1.0 0.5 0.5  PROT 6.3* 5.7* 5.6*  ALBUMIN 2.8* 2.5* 2.6*   Coagulation Profile: Recent Labs  Lab 04/12/17 1011 04/13/17 0444 04/14/17 0511 04/15/17 0427 04/16/17 0443  INR 4.79* 3.48 2.62 2.12 1.46    CBG: Recent Labs  Lab 04/15/17 1126 04/15/17 1606 04/15/17 2048 04/16/17 0740 04/16/17 1152  GLUCAP 143* 181* 157* 150* 210*     Recent Results (from the past 240 hour(s))  MRSA PCR Screening     Status: None   Collection Time: 04/10/17  2:43 PM  Result Value Ref Range Status   MRSA by PCR NEGATIVE NEGATIVE Final    Comment:        The GeneXpert MRSA Assay (FDA approved for NASAL  specimens only), is one component of a comprehensive MRSA colonization surveillance program. It is not intended to diagnose MRSA infection nor to guide or monitor treatment for MRSA infections. Performed at [facility], [place] [address] [place], [place] [address]   Culture, Urine     Status: Abnormal   Collection Time: 04/15/17  2:01 AM  Result Value Ref Range Status   Specimen Description   Final    URINE, RANDOM Performed at [place] [address], [place], [place] [address]    Special Requests   Final    NONE Performed at [facility], [place] [address], [place], [place] [address]    Culture MULTIPLE SPECIES PRESENT, SUGGEST RECOLLECTION (A)  Final   Report Status 04/16/2017 FINAL  Final      Radiology Studies: No results found.   Medications:  Scheduled: . buPROPion  150 mg Oral Daily  . doxazosin  4 mg Oral Daily  . ferrous sulfate  325 mg Oral BID  . finasteride  5 mg Oral QHS  . fluticasone  1 spray Each Nare BID  . insulin aspart  0-5 Units Subcutaneous QHS  . insulin aspart  0-9 Units Subcutaneous TID WC  . loratadine  10 mg Oral Daily  . Melatonin  5 mg Oral QHS  . metoprolol succinate  12.5 mg Oral BID WC  . pantoprazole  40 mg Oral Daily  . simvastatin  20 mg Oral QPM  . sodium chloride flush  3 mL Intravenous Q12H  . umeclidinium-vilanterol  1 puff Inhalation Daily   Continuous: . sodium chloride    . [START ON 04/17/2017] ferric gluconate (FERRLECIT/NULECIT) IV     IRS:[id number] **OR** acetaminophen, albuterol, ondansetron **OR** ondansetron (ZOFRAN) IV, polyethylene glycol, traMADol  Assessment/Plan:  Active Problems:   Essential hypertension   PVD, Rt SFA PTA/HSRA 06/17/11   AS (aortic stenosis)- s/p tissue AVR 01/07/11   Thrombocytopenia (HCC)   Chronic atrial fibrillation (HCC)   COPD (chronic obstructive pulmonary disease) (HCC)   Acute on chronic diastolic (congestive) heart  failure (HCC)   Chronic renal insufficiency, stage IV (severe) (HCC)   Diabetes mellitus type 2, uncontrolled (HCC)   Acute blood loss anemia   GI bleed   AKI (acute kidney injury) ([place])   Acute renal failure superimposed on stage 3 chronic kidney disease ([place])   Goals of care, counseling/discussion   DNR (do not resuscitate) discussion   Palliative care encounter   Acute on chronic kidney injury - Progressive worsening of kidney function since presentation.  At this point in time suspect likely 2/2 unknown GN.   -Nephrology was  consulted.  Initially it was felt that he was not a dialysis candidate.  However patient and family wanted to discuss again with the nephrologist.  Patient wants to pursue all treatment for now.  So nephrology recommends transferring the patient to [facility] for dialysis.  Dialysis catheter to be placed at [facility]. - GN labs notable for negative anca, anti GBM, C3, C4, ANA, Hep B/C.  SPEP notable for hypoalbuminemia, no evidence of monoclonal protein.  - renal US (without hydronephrosis)   Acute on chronic diastolic congestive heart failure CXR 2/17 with stable cardiomegaly and persistent moderate interstitial edema, small amount of pleural fluid Stable trace dependent edema, but now on room air Echo 03/12/17 with normal EF, mod pulm hypertension (not sufficient to allow eval of LV diastolic dysfunction) -Weight daily, strict ins and outs, 1.5 L fluid restriction, 2 g sodium restricted diet      Acute lower GI bleed causing acute on chronic anemia Poor historian.  Notes "dark stools" for a while, but with stable H/H not sure how significant his bleeding is.  Per GI at this point in time, will continue to monitor.  No plans for EGD at the present.  Seen by Dr. Leroy Kennedy on 2/15. - H/H relatively stable since transfusion.  he was transfused 1 unit on 2/14. -No obvious overt bleeding has been noted.  Hemoglobin noted to be slightly low today.  INR remains  subtherapeutic.  Recheck tomorrow.  If there is persistent drop we may have to call gastroenterology again.  Continue PPI.    Elevated INR -Warfarin was held in the setting of elevated INR and possible GI bleed.  INR is subtherapeutic now.   Paroxysmal atrial fibrillation  Hx bioprosthetic AVR Metoprolol dose was decreased due to bradycardia.  Warfarin now on hold as discussed above.  If hemoglobin remains stable warfarin could be resumed once dialysis catheter has been placed.  If on the other hand hemoglobin continues to drift down then GI may have to be called back.    COPD -Stable   Diabetes mellitus type II   Continue sliding scale coverage.  Holding Lantus and home glyburide.     Benign prostatic hypertrophy -Continue finasteride 5 mg nightly -Continue doxazosin 4 mg daily   Essential hypertension  -Continue metoprolol, hold benazepril in setting of AK I   Hyperlipidemia -Continue simvastatin 20 mg daily   Psych -Continue bupropion 150 mg daily -Continue as needed tramadol 50 mg twice a day   Osteoporosis - Will plan to d/c alendronate as GI noted potentially ulcerogenic (please ensure discontinued at discharge)   Palliative Care c/s for goals of care with worsening renal function Now patient wishes to proceed with dialysis.   DVT prophylaxis: SCD Code Status: DNR Family Communication: No family at bedside Disposition Plan: Transfer to [facility] for dialysis    LOS: 6 days   [facility] Pager [phone] 04/16/2017, 3:23 PM  If 7PM-7AM, please contact night-coverage at [email or web address], password [facility]

## 2017-04-16 NOTE — Consult Note (Addendum)
Chief Complaint: Patient was seen in consultation today for tunneled hemodialysis catheter placement Chief Complaint  Patient presents with  . Shortness of Breath    Referring Physician(s): Schertz,R  Supervising Physician: Jacqulynn Cadet  Patient Status: [facility] - In-pt  History of Present Illness: Ronald [name] is a 84 y.o. male smoker with past medical history significant for aortic stenosis with prior aortic valve replacement, bladder cancer, chronic atrial fibrillation, CHF, peripheral vascular disease with prior stenting, COPD, hypertension, renal artery stenosis with prior stenting, and diabetes who was recently admitted to the hospital with CHF exacerbation and elevated creatinine up to 3.5.  Patient also noted to have proteinuria and hematuria.  Since admission patient has had progressive worsening of renal function with creatinine currently at 4.67.  He was evaluated by nephrology and request now received for tunneled HD catheter placement.  Past Medical History:  Diagnosis Date  . AS (aortic stenosis)    bovine aortic valve replacement 01/07/11  . Bladder cancer [facility])    Bladder Cancer local  . Blood transfusion    w/hip operation  . Cellulitis of left lower extremity   . Chronic atrial fibrillation ([place])   . Chronic diastolic congestive heart failure ([place])   . Claudication in peripheral vascular disease ([place]) 06/17/2011  . COPD (chronic obstructive pulmonary disease) ([place])   . Depression wife died 4 years ago.    Marland Kitchen History of stomach ulcers ~ 1951  . HTN (hypertension)   . Neuropathy due to secondary diabetes ([place])   . Peripheral vascular disease ([place]) very poor circulation legs and feet ... stents right and left legs... done in dr j. Gwenlyn Found 's office.   . Pneumonia 07/25/11   left  . Recurrent upper respiratory infection (URI)    sinusitis  . Renal artery stenosis ([place]) 2006   renal artery stent  . S/P angioplasty with stent, diamond back rotational  athrectomy Prox. Rt. SFA 06/17/2011 06/17/2011  . Shortness of breath   . Thrombocytopenia due to drugs    seen by Dr Inda Merlin plts [id number] no rx  . Type II diabetes mellitus ([place])     Past Surgical History:  Procedure Laterality Date  . ABDOMINAL ANGIOGRAM  06/17/2011   Procedure: ABDOMINAL ANGIOGRAM;  Surgeon: Lorretta Harp, MD;  Location: [facility] CATH LAB;  Service: Cardiovascular;;  . AORTIC VALVE REPLACEMENT  01/07/2011   Procedure: AORTIC VALVE REPLACEMENT (AVR);  Surgeon: Grace Isaac, MD;  Location: [place];  Service: Open Heart Surgery;  Laterality: N/A;; magna-ease bovine 5mm bioprosthesis  . ATHERECTOMY N/A 06/17/2011   Procedure: ATHERECTOMY;  Surgeon: Lorretta Harp, MD;  Location[facility] CATH LAB;  Service: Cardiovascular;  Laterality: N/A;  . CARDIAC CATHETERIZATION  11/19/10   normal coronaries, mod AS, 75% l RAS  . CARDIOVERSION  04/03/2011   Procedure: CARDIOVERSION;  Surgeon: Leonie Man, MD;  Location: [place];  Service: Cardiovascular;  Laterality: N/A;  . CATARACT EXTRACTION W/ INTRAOCULAR LENS  IMPLANT, BILATERAL  ~ 2007  . CHOLECYSTECTOMY    . FEMUR IM NAIL  06/27/2011   Procedure: INTRAMEDULLARY (IM) NAIL FEMORAL;  Surgeon: Marin Shutter, MD;  Location: [facility];  Service: Orthopedics;  Laterality: Right;  . FEMUR IM NAIL Left 12/14/2013   Procedure: INTRAMEDULLARY (IM) NAIL FEMORAL;  Surgeon: Mauri Pole, MD;  Location: [facility];  Service: Orthopedics;  Laterality: Left;  . FRACTURE SURGERY    . LOWER EXTREMITY ANGIOGRAM  06/17/2011   diamondback orbital rotational and cutting balloon  atherectomy of the prox R SFA  . LOWER EXTREMITY ANGIOGRAM Bilateral 06/17/2011   Procedure: LOWER EXTREMITY ANGIOGRAM;  Surgeon: Lorretta Harp, MD;  Location: [facility] CATH LAB;  Service: Cardiovascular;  Laterality: Bilateral;  . PERIPHERAL ARTERIAL STENT GRAFT     2006 left anf right illiac stents Dr Deon Pilling  . RENAL ARTERY STENT  2006   "I believe"  . REVERSE SHOULDER ARTHROPLASTY Left  12/31/2016   Procedure: LEFT REVERSE SHOULDER ARTHROPLASTY;  Surgeon: Nicholes Stairs, MD;  Location: [place];  Service: Orthopedics;  Laterality: Left;  . TONSILLECTOMY AND ADENOIDECTOMY     "when I was a kid"    Allergies: Aspirin  Medications: Prior to Admission medications   Medication Sig Start Date End Date Taking? Authorizing Provider  acetaminophen (TYLENOL) 325 MG tablet Take 650 mg by mouth every 6 (six) hours as needed for mild pain.   Yes [provider]  albuterol (ACCUNEB) 0.63 MG/3ML nebulizer solution Take 3 mLs by nebulization every 6 (six) hours as needed for wheezing or shortness of breath.   Yes [provider]  alendronate (FOSAMAX) 70 MG tablet Take 70 mg by mouth every 14 (fourteen) days. Take every other sunday 12/14/16  Yes [provider]  benazepril (LOTENSIN) 20 MG tablet Take 20 mg by mouth daily.   Yes [provider]  buPROPion (WELLBUTRIN XL) 150 MG 24 hr tablet Take 150 mg by mouth daily.    Yes [provider]  doxazosin (CARDURA) 4 MG tablet Take 4 mg by mouth daily.    Yes Collins, Gina L, PA-C  ferrous sulfate 325 (65 FE) MG EC tablet Take 1 tablet (325 mg total) by mouth 2 (two) times daily. 07/04/16 07/04/17 Yes Lavina Hamman, MD  finasteride (PROSCAR) 5 MG tablet Take 5 mg by mouth at bedtime.    Yes [provider]  fluticasone (FLONASE) 50 MCG/ACT nasal spray Place 1 spray into both nostrils 2 (two) times daily.    Yes [provider]  furosemide (LASIX) 40 MG tablet Take 1 tablet (40 mg total) by mouth daily. 07/04/16  Yes Lavina Hamman, MD  glyBURIDE (DIABETA) 5 MG tablet Take 2.5-10 mg by mouth See admin instructions. 10mg  by mouth in the morning. 2.5mg  in the evening   Yes [provider]  Insulin Glargine (LANTUS) 100 UNIT/ML Solostar Pen Inject 10 Units into the skin daily.    Yes [provider]  loratadine (CLARITIN) 10 MG tablet Take 10 mg by mouth daily.    Yes [provider]  Melatonin 5 MG TABS Take 5 mg by mouth at bedtime.   Yes [provider]  metoprolol succinate (TOPROL-XL) 50 MG 24 hr tablet Take 50 mg by mouth 2 (two) times daily. Take with or immediately following a meal.   Yes [provider]  polyethylene glycol (MIRALAX / GLYCOLAX) packet Take 17 g by mouth daily. 12/25/16  Yes Mariel Aloe, MD  potassium chloride SA (K-DUR,KLOR-CON) 20 MEQ tablet Take 20 mEq by mouth daily.    Yes [provider]  simvastatin (ZOCOR) 20 MG tablet Take 20 mg by mouth every evening.   Yes [provider]  traMADol (ULTRAM) 50 MG tablet Take 1 tablet (50 mg total) by mouth every 12 (twelve) hours as needed for moderate pain. 03/13/17  Yes Arrien, Jimmy Picket, MD  Umeclidinium-Vilanterol [facility] ELLIPTA) 62.5-25 MCG/INH AEPB Inhale 1 puff into the lungs daily.   Yes [provider]  warfarin (COUMADIN)  2 MG tablet Take 1-2 mg by mouth See admin instructions. 1mg  by mouth once daily in the evening on Tuesday and Thursday. 2mg  by mouth once daily in the evening on Monday, Wednesday, Friday, Saturday, Sunday 12/15/16  Yes [provider]     Family History  Problem Relation Age of Onset  . Heart disease Mother   . Cancer Brother        colon  . Stroke Father     Social History   Socioeconomic History  . Marital status: Widowed    Spouse name: None  . Number of children: 3  . Years of education: None  . Highest education level: None  Social Needs  . Financial resource strain: None  . Food insecurity - worry: None  . Food insecurity - inability: None  . Transportation needs - medical: None  . Transportation needs - non-medical: None  Occupational History  . Occupation: Press photographer  Tobacco Use  . Smoking status: Current Every Day Smoker    Packs/day: 1.50    Years: 75.00    Pack years: 112.50    Types: Cigarettes  . Smokeless tobacco: Never Used  Substance and Sexual Activity   . Alcohol use: No    Alcohol/week: 1.2 oz    Types: 2 Cans of beer per week  . Drug use: No  . Sexual activity: No  Other Topics Concern  . None  Social History Narrative  . None      Review of Systems currently denies fever, headache, worsening chest pain, abdominal/back pain, nausea, vomiting or bleeding.  He does have weakness, dyspnea.  Vital Signs: BP (!) 147/47 (BP Location: Right Arm)   Pulse 69   Temp 97.6 F (36.4 C) (Axillary)   Resp 18   Ht 6' (1.829 m)   Wt 174 lb 12.8 oz (79.3 kg)   SpO2 99%   BMI 23.71 kg/m   Physical Exam awake, alert.  Chest -slightly diminished breath sounds bases, heart with normal rate, irregular rhythm, positive murmur.  Abdomen soft, positive bowel sounds, nontender.  No lower extremity edema.  Eczematous rash right upper anterolateral chest region.  Imaging: Dg Chest 2 View  Result Date: 04/10/2017 CLINICAL DATA:  Shortness of breath. EXAM: CHEST  2 VIEW COMPARISON:  Radiograph 03/10/2017 FINDINGS: Post median sternotomy with prosthetic aortic valve. Prior left atrial clipping. Unchanged cardiomegaly. Progressive pulmonary edema from prior exam. Increased bilateral pleural effusions. No pneumothorax. Left shoulder surgical hardware is partially included. IMPRESSION: Progressive pulmonary edema and increased bilateral pleural effusions consistent with worsening CHF. Electronically Signed   By: Jeb Levering M.D.   On: 04/10/2017 03:21   US Renal  Result Date: 04/12/2017 CLINICAL DATA:  Acute kidney injury EXAM: RENAL / URINARY TRACT ULTRASOUND COMPLETE COMPARISON:  CT abdomen pelvis 06/29/2011 FINDINGS: Right Kidney: Length: 11.2 cm. Cortical thinning diffusely with increased echogenicity of the cortex. Right upper pole cyst 14 mm. Negative for hydronephrosis or mass. Left Kidney: Length: 11.2 cm. Mild increased echogenicity of the cortex. Left upper pole cyst 2.4 cm. Negative for mass or hydronephrosis. Bladder: Appears normal for degree  of bladder distention. IMPRESSION: Increased echogenicity of the renal cortex bilaterally. Cortical thinning noted in the right kidney. Bilateral renal cysts. Negative for hydronephrosis. Electronically Signed   By: Franchot Gallo M.D.   On: 04/12/2017 13:04   Dg Chest Port 1 View  Result Date: 04/13/2017 CLINICAL DATA:  Hypoxia. EXAM: PORTABLE CHEST 1 VIEW COMPARISON:  04/12/2017 FINDINGS: Lungs are adequately inflated and  demonstrate continued hazy perihilar bibasilar opacification likely persistent moderate interstitial edema and possible small bilateral pleural effusions. Stable cardiomegaly. Remainder the exam is unchanged. IMPRESSION: Stable cardiomegaly and persistent moderate interstitial edema. Likely small amount of bilateral pleural fluid. Electronically Signed   By: Marin Olp M.D.   On: 04/13/2017 08:05   Dg Chest Port 1 View  Result Date: 04/12/2017 CLINICAL DATA:  Hypoxia EXAM: PORTABLE CHEST 1 VIEW COMPARISON:  04/11/2017 FINDINGS: Diffuse bilateral airspace disease with mild interval progression. Bilateral pleural effusions also with mild progression. Cardiac enlargement. Aortic valve replacement. Left atrial clip. Bibasilar atelectasis. IMPRESSION: Progression of congestive heart failure. Worsening edema and bilateral effusions. Electronically Signed   By: Franchot Gallo M.D.   On: 04/12/2017 06:37   Dg Chest Port 1 View  Result Date: 04/11/2017 CLINICAL DATA:  Shortness of Breath EXAM: PORTABLE CHEST 1 VIEW COMPARISON:  April 10, 2017 FINDINGS: There is cardiomegaly with pulmonary venous hypertension. There are pleural effusions bilaterally with bibasilar atelectasis. No airspace consolidation or pulmonary edema evident. There is a prosthetic aortic valve present. There is a left atrial appendage clamp. No adenopathy. There is postoperative total shoulder replacement on the left. IMPRESSION: Pulmonary vascular congestion with bibasilar atelectasis and small pleural effusions. No  frank edema or consolidation evident. Areas of postoperative change noted. Electronically Signed   By: Lowella Grip III M.D.   On: 04/11/2017 11:19    Labs:  CBC: Recent Labs    04/13/17 0444  04/14/17 0511 04/14/17 1338 04/15/17 0427 04/15/17 1442 04/16/17 0443  WBC 6.4  --  6.5  --  6.6  --  5.6  HGB 8.1*   < > 7.8* 8.2* 7.8* 7.8* 7.3*  HCT 24.7*   < > 23.7* 24.7* 23.7* 23.4* 21.2*  PLT 98*  --  95*  --  89*  --  88*   < > = values in this interval not displayed.    COAGS: Recent Labs    04/13/17 0444 04/14/17 0511 04/15/17 0427 04/16/17 0443  INR 3.48 2.62 2.12 1.46    BMP: Recent Labs    04/13/17 0444 04/14/17 0511 04/15/17 0427 04/16/17 0443  NA 139 138 139 141  K 3.9 3.9 4.3 4.3  CL 106 105 106 107  CO2 24 24 20* 22  GLUCOSE 136* 110* 156* 145*  BUN 65* 75* 79* 92*  CALCIUM 8.1* 8.0* 8.1* 8.4*  CREATININE 4.38* 4.67* 5.24* 5.65*  GFRNONAA 11* 10* 8* 8*  GFRAA 12* 11* 10* 9*    LIVER FUNCTION TESTS: Recent Labs    03/10/17 0853 04/11/17 0405 04/14/17 0511 04/15/17 0427  BILITOT 1.0 1.0 0.5 0.5  AST 18 13* 13* 15  ALT 16* 13* 11* 11*  ALKPHOS 79 85 71 74  PROT 6.6 6.3* 5.7* 5.6*  ALBUMIN 2.8* 2.8* 2.5* 2.6*    TUMOR MARKERS: No results for input(s): AFPTM, CEA, CA199, CHROMGRNA in the last 8760 hours.  Assessment and Plan: 84 y.o. male smoker with past medical history significant for aortic stenosis with prior aortic valve replacement, bladder cancer, chronic atrial fibrillation, CHF, peripheral vascular disease with prior stenting, COPD, hypertension, renal artery stenosis with prior stenting, and diabetes who was recently admitted to the hospital with CHF exacerbation and elevated creatinine up to 3.5.  Patient also noted to have proteinuria and hematuria.  Since admission patient has had progressive worsening of renal function with creatinine currently at 4.67.  He was evaluated by nephrology and request now received for tunneled HD  catheter placement.  Details/risks of procedure, including but not limited to, internal bleeding, infection, injury to adjacent structures discussed with patient and son with their understanding and consent.  Patient currently awaiting transfer to [facility] for above procedure which is tent planned for 2/21.   Thank you for this interesting consult.  I greatly enjoyed meeting SERAPIO EDELSON and look forward to participating in their care.  A copy of this report was sent to the requesting provider on this date.  Electronically Signed: D. Rowe Robert, PA-C 04/16/2017, 5:11 PM   I spent a total of 30 minutes  in face to face in clinical consultation, greater than 50% of which was counseling/coordinating care for tunneled hemodialysis catheter placement

## 2017-04-16 NOTE — Progress Notes (Signed)
Pt transferring to [facility] for HD.

## 2017-04-17 ENCOUNTER — Other Ambulatory Visit (HOSPITAL_COMMUNITY): Payer: Self-pay

## 2017-04-17 ENCOUNTER — Encounter (HOSPITAL_COMMUNITY): Payer: Self-pay | Admitting: Interventional Radiology

## 2017-04-17 ENCOUNTER — Inpatient Hospital Stay (HOSPITAL_COMMUNITY): Payer: PPO

## 2017-04-17 DIAGNOSIS — D696 Thrombocytopenia, unspecified: Secondary | ICD-10-CM

## 2017-04-17 HISTORY — PX: IR US GUIDE VASC ACCESS LEFT: IMG2389

## 2017-04-17 HISTORY — PX: IR FLUORO GUIDE CV LINE LEFT: IMG2282

## 2017-04-17 LAB — GLUCOSE, CAPILLARY
Glucose-Capillary: 214 mg/dL — ABNORMAL HIGH (ref 65–99)
Glucose-Capillary: 230 mg/dL — ABNORMAL HIGH (ref 65–99)

## 2017-04-17 LAB — BASIC METABOLIC PANEL
ANION GAP: 10 (ref 5–15)
BUN: 87 mg/dL — ABNORMAL HIGH (ref 6–20)
CALCIUM: 8.2 mg/dL — AB (ref 8.9–10.3)
CO2: 22 mmol/L (ref 22–32)
Chloride: 104 mmol/L (ref 101–111)
Creatinine, Ser: 5.55 mg/dL — ABNORMAL HIGH (ref 0.61–1.24)
GFR, EST AFRICAN AMERICAN: 9 mL/min — AB (ref >60)
GFR, EST NON AFRICAN AMERICAN: 8 mL/min — AB (ref >60)
Glucose, Bld: 236 mg/dL — ABNORMAL HIGH (ref 65–99)
Potassium: 4.1 mmol/L (ref 3.5–5.1)
Sodium: 136 mmol/L (ref 135–145)

## 2017-04-17 LAB — HEPATITIS PANEL, ACUTE
HCV Ab: <0.1 s/co ratio (ref 0.0–0.9)
HEP A IGM: NEGATIVE
Hep B C IgM: NEGATIVE
Hepatitis B Surface Ag: NEGATIVE

## 2017-04-17 MED ORDER — MIDAZOLAM HCL 2 MG/2ML IJ SOLN
INTRAMUSCULAR | Status: AC
  Filled 2017-04-17: qty 2

## 2017-04-17 MED ORDER — FENTANYL CITRATE (PF) 100 MCG/2ML IJ SOLN
INTRAMUSCULAR | Status: AC | PRN
  Administered 2017-04-17: 25 ug via INTRAVENOUS

## 2017-04-17 MED ORDER — HEPARIN SODIUM (PORCINE) 1000 UNIT/ML IJ SOLN
INTRAMUSCULAR | Status: AC
  Administered 2017-04-17: 4.2 mL
  Filled 2017-04-17: qty 1

## 2017-04-17 MED ORDER — CEFAZOLIN SODIUM-DEXTROSE 2-4 GM/100ML-% IV SOLN
INTRAVENOUS | Status: AC
  Filled 2017-04-17: qty 100

## 2017-04-17 MED ORDER — MELATONIN 3 MG PO TABS
4.5000 mg | ORAL_TABLET | Freq: Every day | ORAL | Status: DC
  Administered 2017-04-18 – 2017-04-25 (×9): 4.5 mg via ORAL
  Filled 2017-04-17 (×10): qty 1.5

## 2017-04-17 MED ORDER — LIDOCAINE HCL (PF) 1 % IJ SOLN
INTRAMUSCULAR | Status: AC | PRN
  Administered 2017-04-17: 8 mL

## 2017-04-17 MED ORDER — LIDOCAINE HCL 1 % IJ SOLN
INTRAMUSCULAR | Status: AC
  Filled 2017-04-17: qty 20

## 2017-04-17 MED ORDER — FENTANYL CITRATE (PF) 100 MCG/2ML IJ SOLN
INTRAMUSCULAR | Status: AC
  Filled 2017-04-17: qty 2

## 2017-04-17 MED ORDER — HEPARIN SODIUM (PORCINE) 1000 UNIT/ML DIALYSIS: 2000.0000 [IU] | INTRAMUSCULAR | Status: DC | PRN

## 2017-04-17 NOTE — Sedation Documentation (Signed)
Patient is resting comfortably. 

## 2017-04-17 NOTE — Procedures (Signed)
Interventional Radiology Procedure Note  Procedure: Left IJ tunneled HD catheter (28 cm Palindrome) with catheter tip in the mid RA.  Ready for immediate use.   Complications: None  Estimated Blood Loss: None  Recommendations: - Routine line care  Signed,  Criselda Peaches, MD

## 2017-04-17 NOTE — Progress Notes (Signed)
Pt placed on board for transfer but no one called for report. Attempted to call to get report for pt.  Calls transferred to wrong units several times. Was given nurses phone to get report but when called she took my number to call back due to patient care. When receiving report for patient, I asked if transport had already been called and was told it had not. Reminded RN to call for transport.

## 2017-04-17 NOTE — Progress Notes (Signed)
PROGRESS NOTE  Ronald [name] TJQ:[id number] DOB: 1922/07/22 DOA: 04/10/2017 PCP: [name], MD  Brief Narrative: 89yom presented with SOB, tarry stools. Admitted for acute on chronic diastolic CHF, acute GIB, AKI, elevated INR on warfarin. Seen by GI but Hgb remained stable. Warfarin stopped. GI recommended no intervention and signed off. Seen by nephrology with initial rec for conservative care, no HD. Subsequently patient opted for HD and was transferred to [facility] 2/20. HD started 2/21  Assessment/Plan Acute renal failure, progressive, suspected GN.  - HD per nephrology   Acute on chronic diastolic CHF - did not respond well to diuretics. Volume management per nephrology. - continue Toprol  Melena with high INR on admission - Hgb stable since admission. GI rec conservative care and signed off. - continue Protonix   ABLA superimposed on anemia of chronic disease - check CBC in AM  Thrombocytopenia - chronic, stable  COPD - stable. Continue nebs, Anoro ellipta  Chronic atrial fibrillation - stable. Continue Toprol.   - Warfarin discontinued   DM type 2 on insulin  - stable. Continue SSI  Bioprosthetic AVR   DVT prophylaxis: SCDs Code Status: DNR Family Communication: daughter at bedside Disposition Plan: pending    Murray Hodgkins, MD  [facility] Direct contact: [phone] --[place] [place]  --[email or web address]; password [email or web address]  7PM-7AM contact night coverage as above 04/17/2017, 3:02 PM  LOS: 7 days   Consultants:  GI  Nephrology  PMT  Procedures: Left IJ tunneled HD catheter (28 cm Palindrome) with catheter tip in the mid RA  Antimicrobials:    Interval history/Subjective: Short of breath. No n/v.  Objective: Vitals:  Vitals:   04/17/17 1059 04/17/17 1430  BP:  (!) 146/72  Pulse: 63 69  Resp: 16   Temp:  97.8 F (36.6 C)  SpO2: 94% 97%    Exam:  Constitutional:  . Appears calm, mildly uncomfortable Respiratory:  . CTA  bilaterally, no w/r/r.  . Respiratory effort mildly increased Cardiovascular:  . RRR, no m/r/g . No LE extremity edema   Psychiatric:  . Mental status o Mood, affect appropriate   I have personally reviewed the following:   Labs:  CBG stable  No change in BMP  Scheduled Meds: . buPROPion  150 mg Oral Daily  . doxazosin  4 mg Oral Daily  . fentaNYL      . ferrous sulfate  325 mg Oral BID  . finasteride  5 mg Oral QHS  . fluticasone  1 spray Each Nare BID  . insulin aspart  0-5 Units Subcutaneous QHS  . insulin aspart  0-9 Units Subcutaneous TID WC  . lidocaine      . loratadine  10 mg Oral Daily  . Melatonin  5 mg Oral QHS  . metoprolol succinate  12.5 mg Oral BID WC  . oxymetazoline  2 spray Right Nare UD  . pantoprazole  40 mg Oral Daily  . simvastatin  20 mg Oral QPM  . sodium chloride flush  3 mL Intravenous Q12H  . umeclidinium-vilanterol  1 puff Inhalation Daily   Continuous Infusions: . sodium chloride    . sodium chloride    . sodium chloride    .  ceFAZolin (ANCEF) IV    . ferric gluconate (FERRLECIT/NULECIT) IV      Active Problems:   Essential hypertension   PVD, Rt SFA PTA/HSRA 06/17/11   AS (aortic stenosis)- s/p tissue AVR 01/07/11   Thrombocytopenia (HCC)   Chronic atrial fibrillation (HCC)  COPD (chronic obstructive pulmonary disease) (HCC)   Acute on chronic diastolic (congestive) heart failure (HCC)   Chronic renal insufficiency, stage IV (severe) (HCC)   Diabetes mellitus type 2, uncontrolled (HCC)   Acute blood loss anemia   GI bleed   AKI (acute kidney injury) ([place])   Acute renal failure superimposed on stage 3 chronic kidney disease (HCC)   Goals of care, counseling/discussion   DNR (do not resuscitate) discussion   Palliative care encounter   LOS: 7 days

## 2017-04-17 NOTE — Progress Notes (Addendum)
[facility] Progress Note  Subjective: Patient w/o complaints.  Had [facility] placed this am.     Vitals:   04/17/17 0950 04/17/17 0955 04/17/17 1000 04/17/17 1059  BP: (!) 165/63 (!) 159/59 (!) 178/59   Pulse: 72 65 66 63  Resp: 16 15 (!) 22 16  Temp:      TempSrc:      SpO2: 100% 100% 100% 94%  Weight:      Height:        Inpatient medications: . buPROPion  150 mg Oral Daily  . doxazosin  4 mg Oral Daily  . fentaNYL      . ferrous sulfate  325 mg Oral BID  . finasteride  5 mg Oral QHS  . fluticasone  1 spray Each Nare BID  . insulin aspart  0-5 Units Subcutaneous QHS  . insulin aspart  0-9 Units Subcutaneous TID WC  . lidocaine      . loratadine  10 mg Oral Daily  . Melatonin  5 mg Oral QHS  . metoprolol succinate  12.5 mg Oral BID WC  . oxymetazoline  2 spray Right Nare UD  . pantoprazole  40 mg Oral Daily  . simvastatin  20 mg Oral QPM  . sodium chloride flush  3 mL Intravenous Q12H  . umeclidinium-vilanterol  1 puff Inhalation Daily   . sodium chloride    . sodium chloride    . sodium chloride    .  ceFAZolin (ANCEF) IV    . ferric gluconate (FERRLECIT/NULECIT) IV     sodium chloride, sodium chloride, acetaminophen **OR** acetaminophen, albuterol, alteplase, heparin, heparin, lidocaine (PF), lidocaine-prilocaine, ondansetron **OR** ondansetron (ZOFRAN) IV, pentafluoroprop-tetrafluoroeth, polyethylene glycol, traMADol  Exam: Gen: elderly WM, no distress Neck:  +JVD Lungs:  bilat crackles at bases Heart:  Regular rate and rhythm; systolic murmur Abdomen:  Soft, nontender and nondistended Ext:  no edema Neuro: mild asterixis, Ox 3  Assessment/ Plan:  1. Renal failure - progressive, suspected GN (+prot/ rbc's in urine), or possibly TMA w borderline low plts.  Serologies all negative.  Poor candidate for renal biopsy/ pheresis/ immunosuppression at his age.  Plan is for 1st dialysis today via [facility]  Long-term prognosis poor but may be able to tolerate  HD short-term.   2. Volume - still looks a bit wet, UF as tol on HD today 3. Anemia - sp darbe 100 ug on 2/16, will give 500 mg IV Fe over next several days; transfuse prn Hb < 7 4. HTN - on metop/ cardura 5. DNR 6. Thrombocytopenia: plts 80- 100 range  Kelly Splinter MD [facility] pgr [phone]   04/17/2017, 1:12 PM   Recent Labs  Lab 04/14/17 0511 04/15/17 0427 04/16/17 0443  NA 138 139 141  K 3.9 4.3 4.3  CL 105 106 107  CO2 24 20* 22  GLUCOSE 110* 156* 145*  BUN 75* 79* 92*  CREATININE 4.67* 5.24* 5.65*  CALCIUM 8.0* 8.1* 8.4*   Recent Labs  Lab 04/11/17 0405 04/14/17 0511 04/15/17 0427  AST 13* 13* 15  ALT 13* 11* 11*  ALKPHOS 85 71 74  BILITOT 1.0 0.5 0.5  PROT 6.3* 5.7* 5.6*  ALBUMIN 2.8* 2.5* 2.6*   Recent Labs  Lab 04/14/17 0511  04/15/17 0427 04/15/17 1442 04/16/17 0443  WBC 6.5  --  6.6  --  5.6  HGB 7.8*   < > 7.8* 7.8* 7.3*  HCT 23.7*   < > 23.7* 23.4* 21.2*  MCV  91.2  --  91.5  --  91.4  PLT 95*  --  89*  --  88*   < > = values in this interval not displayed.   Iron/TIBC/Ferritin/ %Sat    Component Value Date/Time   IRON 35 (L) 04/12/2017 1648   TIBC 162 (L) 04/12/2017 1648   FERRITIN 77 04/12/2017 1648   IRONPCTSAT 22 04/12/2017 1648   IRONPCTSAT 28 03/13/2006 1323

## 2017-04-17 NOTE — Progress Notes (Signed)
CBG 230, has not transferred in computer. P.J. Linus Mako, RN

## 2017-04-17 NOTE — Progress Notes (Addendum)
Pt arrived to unit via [facility]. [facility] reports nosebleed in route which resolved at arrival. Pt oriented to room and unit and instructed to call before getting up. Pt verbalized understanding. Pt given call light and and bed placed in lowest position. Will continue to monitor.

## 2017-04-18 LAB — BASIC METABOLIC PANEL
ANION GAP: 10 (ref 5–15)
BUN: 55 mg/dL — ABNORMAL HIGH (ref 6–20)
CALCIUM: 8.3 mg/dL — AB (ref 8.9–10.3)
CO2: 24 mmol/L (ref 22–32)
CREATININE: 4.42 mg/dL — AB (ref 0.61–1.24)
Chloride: 103 mmol/L (ref 101–111)
GFR, EST AFRICAN AMERICAN: 12 mL/min — AB (ref >60)
GFR, EST NON AFRICAN AMERICAN: 10 mL/min — AB (ref >60)
Glucose, Bld: 157 mg/dL — ABNORMAL HIGH (ref 65–99)
Potassium: 3.8 mmol/L (ref 3.5–5.1)
SODIUM: 137 mmol/L (ref 135–145)

## 2017-04-18 LAB — PREPARE RBC (CROSSMATCH)

## 2017-04-18 LAB — CBC
HCT: 22.4 % — ABNORMAL LOW (ref 39.0–52.0)
HEMOGLOBIN: 7.5 g/dL — AB (ref 13.0–17.0)
MCH: 31 pg (ref 26.0–34.0)
MCHC: 33.5 g/dL (ref 30.0–36.0)
MCV: 92.6 fL (ref 78.0–100.0)
PLATELETS: 102 10*3/uL — AB (ref 150–400)
RBC: 2.42 MIL/uL — AB (ref 4.22–5.81)
RDW: 17.1 % — ABNORMAL HIGH (ref 11.5–15.5)
WBC: 7.2 10*3/uL (ref 4.0–10.5)

## 2017-04-18 LAB — GLUCOSE, CAPILLARY
Glucose-Capillary: 149 mg/dL — ABNORMAL HIGH (ref 65–99)
Glucose-Capillary: 205 mg/dL — ABNORMAL HIGH (ref 65–99)
Glucose-Capillary: 255 mg/dL — ABNORMAL HIGH (ref 65–99)

## 2017-04-18 MED ORDER — DARBEPOETIN ALFA 100 MCG/0.5ML IJ SOSY
100.0000 ug | PREFILLED_SYRINGE | INTRAMUSCULAR | Status: DC
  Filled 2017-04-18: qty 0.5

## 2017-04-18 MED ORDER — SODIUM CHLORIDE 0.9 % IV SOLN: Freq: Once | INTRAVENOUS | Status: DC

## 2017-04-18 MED ORDER — CAMPHOR-MENTHOL 0.5-0.5 % EX LOTN
TOPICAL_LOTION | CUTANEOUS | Status: DC | PRN
  Administered 2017-04-20: 13:00:00 via TOPICAL
  Filled 2017-04-18: qty 222

## 2017-04-18 MED ORDER — DIPHENHYDRAMINE HCL 25 MG PO CAPS
25.0000 mg | ORAL_CAPSULE | Freq: Four times a day (QID) | ORAL | Status: DC | PRN
  Administered 2017-04-25: 25 mg via ORAL
  Filled 2017-04-18: qty 1

## 2017-04-18 NOTE — Consult Note (Signed)
   [facility] Inpatient Consult   04/18/2017  TREMAR WICKENS 24-Jul-1922 [id number]    Spoke with Mr. Rao and daugher, [name] at bedside. [facility] Care Management consent was obtained when he was at [facility].   Will continue to follow along and make appropriate [place] referral once disposition plans are known.  Made inpatient RNCM aware that [place] Management is following.   Marthenia Rolling, MSN-Ed, RN,BSN [facility] Liaison [phone]

## 2017-04-18 NOTE — Progress Notes (Signed)
Physical Therapy Treatment Patient Details Name: Ronald [name] MRN: [id number] DOB: 07-08-1922 Today's Date: 04/18/2017    History of Present Illness Pt is a 84 y.o. male who comes in with increasing shortness of breath, Hx significant for peripheral vascular disease status post vent placement, renal artery stenosis that is post angioplasty with stent and rotational atherectomy in 1443, diastolic heart failure (last echo 03/12/2017), stage III sent chronic kidney disease, hypertension, paroxysmal atrial fibrillation on warfarin, COPD, aortic stenosis status post bovine AV valve replacement in 01/07/2011, hyperlipidemia.    PT Comments    Pt limited in today's session by fatigue and 3/4 DoE. Pt currently requires min-modA for bed mobility, modA for transfers to [place] for steadying with RW while ambulating 18 feet. Pt with 3/4 DoE with ambulation, however SaO2 on RA >90%O2 throughout session. Given pt current mobility he will requires 24 hr assist at discharge if that is not available at his ALF he will need SNF placement to progress his safe mobility. PT will continue to follow acutely until his discharge.     Follow Up Recommendations  Supervision/Assistance - 24 hour;Other (comment);SNF(will require 24 hr assist if returns to ALF )     Equipment Recommendations  Rolling walker with 5" wheels    Recommendations for Other Services       Precautions / Restrictions Precautions Precautions: Fall Precaution Comments: monitor sats Restrictions Weight Bearing Restrictions: No    Mobility  Bed Mobility Overal bed mobility: Needs Assistance Bed Mobility: Supine to Sit;Sit to Supine     Supine to sit: Mod assist Sit to supine: Min assist   General bed mobility comments: ModA for aiding trunk to upright in coming to seated EoB, minA for assisting LE into bed  Transfers Overall transfer level: Needs assistance Equipment used: Rolling walker (2 wheeled) Transfers: Sit to/from  Stand Sit to Stand: Mod assist         General transfer comment: modA for power up and steadying in RW due to posterior lean  Ambulation/Gait Ambulation/Gait assistance: Min assist Ambulation Distance (Feet): 18 Feet Assistive device: Rolling walker (2 wheeled) Gait Pattern/deviations: Step-through pattern;Decreased dorsiflexion - right Gait velocity: slowed Gait velocity interpretation: Below normal speed for age/gender General Gait Details: minA for steadying, pt quickly fatigued with 3/4 DoE, vc for pursed lipped breathing, SaO2 remained above 90%O2 on RA throughout treatment          Balance Overall balance assessment: Needs assistance Sitting-balance support: Feet supported;No upper extremity supported Sitting balance-Leahy Scale: Fair     Standing balance support: Bilateral upper extremity supported Standing balance-Leahy Scale: Poor Standing balance comment: requires RW and minA for balance with gait                            Cognition Arousal/Alertness: Awake/alert Behavior During Therapy: WFL for tasks assessed/performed Overall Cognitive Status: No family/caregiver present to determine baseline cognitive functioning                                           General Comments General comments (skin integrity, edema, etc.): Pt with complaints of itchiness on R side and back      Pertinent Vitals/Pain Pain Assessment: Faces Faces Pain Scale: Hurts a little bit Pain Location: generalized with movement Pain Descriptors / Indicators: Grimacing;Guarding Pain Intervention(s): Limited activity within patient's  tolerance;Monitored during session           PT Goals (current goals can now be found in the care plan section) Acute Rehab PT Goals PT Goal Formulation: With patient Time For Goal Achievement: 04/25/17 Potential to Achieve Goals: Good Progress towards PT goals: PT to reassess next treatment(pt severely limited by fatigue,  scheduled for blood transfus)    Frequency    Min 3X/week      PT Plan Current plan remains appropriate       AM-PAC PT "6 Clicks" Daily Activity  Outcome Measure  Difficulty turning over in bed (including adjusting bedclothes, sheets and blankets)?: A Lot Difficulty moving from lying on back to sitting on the side of the bed? : Unable Difficulty sitting down on and standing up from a chair with arms (e.g., wheelchair, bedside commode, etc,.)?: Unable Help needed moving to and from a bed to chair (including a wheelchair)?: A Little Help needed walking in hospital room?: A Little Help needed climbing 3-5 steps with a railing? : Total 6 Click Score: 11    End of Session Equipment Utilized During Treatment: Gait belt Activity Tolerance: Patient limited by fatigue Patient left: in bed;with call bell/phone within reach;with bed alarm set Nurse Communication: Mobility status PT Visit Diagnosis: Muscle weakness (generalized) (M62.81);Difficulty in walking, not elsewhere classified (R26.2)     Time: 5643-3295 PT Time Calculation (min) (ACUTE ONLY): 21 min  Charges:  $Gait Training: 8-22 mins                    G Codes:       Colyn Miron B. Migdalia Dk PT, DPT Acute Rehabilitation  [phone] Pager [phone] [place] 04/18/2017, 2:37 PM

## 2017-04-18 NOTE — Progress Notes (Signed)
[facility] Progress Note  Subjective: Patient w/o complaints.  Had 1st HD yesterday.     Vitals:   04/17/17 2341 04/18/17 0500 04/18/17 0513 04/18/17 0924  BP:   (!) 144/55 (!) 137/50  Pulse: 64  79   Resp: 20  20   Temp:   97.9 F (36.6 C)   TempSrc:      SpO2: 96%  96%   Weight:  78.2 kg (172 lb 6.4 oz)    Height:        Inpatient medications: . buPROPion  150 mg Oral Daily  . [START ON 04/19/2017] darbepoetin (ARANESP) injection - DIALYSIS  100 mcg Intravenous Q Sat-HD  . doxazosin  4 mg Oral Daily  . ferrous sulfate  325 mg Oral BID  . finasteride  5 mg Oral QHS  . fluticasone  1 spray Each Nare BID  . insulin aspart  0-5 Units Subcutaneous QHS  . insulin aspart  0-9 Units Subcutaneous TID WC  . loratadine  10 mg Oral Daily  . Melatonin  4.5 mg Oral QHS  . metoprolol succinate  12.5 mg Oral BID WC  . oxymetazoline  2 spray Right Nare UD  . pantoprazole  40 mg Oral Daily  . simvastatin  20 mg Oral QPM  . sodium chloride flush  3 mL Intravenous Q12H  . umeclidinium-vilanterol  1 puff Inhalation Daily   . sodium chloride    . ferric gluconate (FERRLECIT/NULECIT) IV     acetaminophen **OR** acetaminophen, albuterol, camphor-menthol, diphenhydrAMINE, heparin, ondansetron **OR** ondansetron (ZOFRAN) IV, polyethylene glycol, traMADol  Exam: Gen: elderly WM, no distress Neck:  +JVD Lungs:  bilat crackles at bases Heart:  Regular rate and rhythm; systolic murmur Abdomen:  Soft, nontender and nondistended Ext:  no edema Neuro: mild asterixis, Ox 3  Assessment:  1. Renal failure - progressive, suspected GN (+prot/ rbc's in urine) vs possible TMA w borderline low plts.  Serologies were negative.  Poor candidate for renal biopsy/ pheresis/ immunosuppression.  Plan is for trial of dialysis,  pt request.  Long-term prognosis poor but may be able to tolerate HD short-term.  2. Volume - slight vol excess, improving 3. Anemia d/t renal failure - sp darbe 100 ug  on 2/16, will give 500 mg IV Fe over next several days; hb still low at 7.5, will give 2 u prbc on HD today 4. HTN - on metop/ cardura 5. DNR 6. Thrombocytopenia: plts 80- 100 range  Plan - HD today and again tomorrow, transfuse prbc's today; PT consult   Kelly Splinter MD [facility] pgr [phone]   04/17/2017, 1:12 PM   Recent Labs  Lab 04/16/17 0443 04/17/17 1525 04/18/17 0806  NA 141 136 137  K 4.3 4.1 3.8  CL 107 104 103  CO2 22 22 24   GLUCOSE 145* 236* 157*  BUN 92* 87* 55*  CREATININE 5.65* 5.55* 4.42*  CALCIUM 8.4* 8.2* 8.3*   Recent Labs  Lab 04/14/17 0511 04/15/17 0427  AST 13* 15  ALT 11* 11*  ALKPHOS 71 74  BILITOT 0.5 0.5  PROT 5.7* 5.6*  ALBUMIN 2.5* 2.6*   Recent Labs  Lab 04/15/17 0427 04/15/17 1442 04/16/17 0443 04/18/17 0806  WBC 6.6  --  5.6 7.2  HGB 7.8* 7.8* 7.3* 7.5*  HCT 23.7* 23.4* 21.2* 22.4*  MCV 91.5  --  91.4 92.6  PLT 89*  --  88* 102*   Iron/TIBC/Ferritin/ %Sat    Component Value Date/Time   IRON  35 (L) 04/12/2017 1648   TIBC 162 (L) 04/12/2017 1648   FERRITIN 77 04/12/2017 1648   IRONPCTSAT 22 04/12/2017 1648   IRONPCTSAT 28 03/13/2006 1323

## 2017-04-18 NOTE — Care Management Note (Addendum)
Case Management Note  Patient Details  Name: SLADEN PLANCARTE MRN: [id number] Date of Birth: 05-Jan-1923  Subjective/Objective:   Admitted with SOB with CHF and worsening renal function, hx of peripheral vascular disease, renal artery stenosiss/pangioplasty with stent and rotational atherectomy in 8295, diastolic heart failure, chronic kidney diseasestage 3, hypertension, paroxysmal atrial fibrillation on warfarin, COPD, aortic stenosis s/pbovine AV valve replacement. Pt transferred from [facility] 2/20  to start HD. Palliative consulted and signed off this admit. [facility] following per hospital liaison.   PTA lived @ [facility].               Boubacar Lerette (Son) Grayling Schranz (Daughter)    [phone]     PCP: [name]  Action/Plan:  NCM /CSW  following for disposition  needs   Expected Discharge Date:                  Expected Discharge Plan:  Assisted Living / Rest Home  In-House Referral:  Clinical Social Work  Discharge planning Services  CM Consult  Post Acute Care Choice:    Choice offered to:     DME Arranged:    DME Agency:     HH Arranged:   HH Agency:   Status of Service:  In process  If discussed at Long Length of Stay Meetings, dates discussed:    Additional Comments:  Prior to transfer from [place]  Referral was given to [facility] and [facility].   Whitman Hero Crafton, RN 04/18/2017, 12:46 PM

## 2017-04-18 NOTE — Progress Notes (Deleted)
[facility] Progress Note  Subjective: Patient w/o complaints.  Had 1st HD yesterday.     Vitals:   04/17/17 2341 04/18/17 0500 04/18/17 0513 04/18/17 0924  BP:   (!) 144/55 (!) 137/50  Pulse: 64  79   Resp: 20  20   Temp:   97.9 F (36.6 C)   TempSrc:      SpO2: 96%  96%   Weight:  78.2 kg (172 lb 6.4 oz)    Height:        Inpatient medications: . buPROPion  150 mg Oral Daily  . doxazosin  4 mg Oral Daily  . ferrous sulfate  325 mg Oral BID  . finasteride  5 mg Oral QHS  . fluticasone  1 spray Each Nare BID  . insulin aspart  0-5 Units Subcutaneous QHS  . insulin aspart  0-9 Units Subcutaneous TID WC  . loratadine  10 mg Oral Daily  . Melatonin  4.5 mg Oral QHS  . metoprolol succinate  12.5 mg Oral BID WC  . oxymetazoline  2 spray Right Nare UD  . pantoprazole  40 mg Oral Daily  . simvastatin  20 mg Oral QPM  . sodium chloride flush  3 mL Intravenous Q12H  . umeclidinium-vilanterol  1 puff Inhalation Daily   . sodium chloride    . sodium chloride    . sodium chloride    . ferric gluconate (FERRLECIT/NULECIT) IV     sodium chloride, sodium chloride, acetaminophen **OR** acetaminophen, albuterol, alteplase, camphor-menthol, diphenhydrAMINE, heparin, heparin, heparin, lidocaine (PF), lidocaine-prilocaine, ondansetron **OR** ondansetron (ZOFRAN) IV, pentafluoroprop-tetrafluoroeth, polyethylene glycol, traMADol  Exam: Gen: elderly WM, no distress Neck:  +JVD Lungs:  bilat crackles at bases Heart:  Regular rate and rhythm; systolic murmur Abdomen:  Soft, nontender and nondistended Ext:  no edema Neuro: mild asterixis, Ox 3  Assessment:  1. Renal failure - progressive, suspected GN (+prot/ rbc's in urine) vs possible TMA w borderline low plts.  Serologies were negative.  Poor candidate for renal biopsy/ pheresis/ immunosuppression.  Plan is for trial of dialysis,  pt request.  Long-term prognosis poor but may be able to tolerate HD short-term.  2.  Volume - slight vol excess, improving 3. Anemia d/t renal failure - sp darbe 100 ug on 2/16, will give 500 mg IV Fe over next several days; transfuse prn Hb < 7 4. HTN - on metop/ cardura 5. DNR 6. Thrombocytopenia: plts 80- 100 range  Plan - HD today and again tomorrow, then prob tiw   Kelly Splinter MD [facility] pgr [phone]   04/17/2017, 1:12 PM   Recent Labs  Lab 04/16/17 0443 04/17/17 1525 04/18/17 0806  NA 141 136 137  K 4.3 4.1 3.8  CL 107 104 103  CO2 22 22 24   GLUCOSE 145* 236* 157*  BUN 92* 87* 55*  CREATININE 5.65* 5.55* 4.42*  CALCIUM 8.4* 8.2* 8.3*   Recent Labs  Lab 04/14/17 0511 04/15/17 0427  AST 13* 15  ALT 11* 11*  ALKPHOS 71 74  BILITOT 0.5 0.5  PROT 5.7* 5.6*  ALBUMIN 2.5* 2.6*   Recent Labs  Lab 04/15/17 0427 04/15/17 1442 04/16/17 0443 04/18/17 0806  WBC 6.6  --  5.6 7.2  HGB 7.8* 7.8* 7.3* 7.5*  HCT 23.7* 23.4* 21.2* 22.4*  MCV 91.5  --  91.4 92.6  PLT 89*  --  88* 102*   Iron/TIBC/Ferritin/ %Sat    Component Value Date/Time   IRON 35 (L) 04/12/2017  1648   TIBC 162 (L) 04/12/2017 1648   FERRITIN 77 04/12/2017 1648   IRONPCTSAT 22 04/12/2017 1648   IRONPCTSAT 28 03/13/2006 1323

## 2017-04-18 NOTE — Progress Notes (Signed)
PROGRESS NOTE  Ronald [name] ZHG:[id number] DOB: 1922-07-29 DOA: 04/10/2017 PCP: [name], MD  Brief Narrative: 89yom presented with SOB, tarry stools. Admitted for acute on chronic diastolic CHF, acute GIB, AKI, elevated INR on warfarin. Seen by GI but Hgb remained stable. Warfarin stopped. GI recommended no intervention and signed off. Seen by nephrology with initial rec for conservative care, no HD. Subsequently patient opted for HD and was transferred to [facility] 2/20. HD started 2/21  Assessment/Plan Acute renal failure, progressive, suspected GN.  - continue HD per nephrology   Acute on chronic diastolic CHF - did not respond well to diuretics. Continue volume management per nephrology. - continue Toprol-XL  Melena with high INR on admission - Hgb remains stable. GI rec conservative care and signed off. - continue Protonix   ABLA superimposed on anemia of chronic disease - Hgb remains stable  Thrombocytopenia - chronic, stable  COPD - remains stable. Continue nebs, Anoro ellipta  Chronic atrial fibrillation - stable. Continue Toprol.   - Warfarin discontinued   DM type 2 on insulin  - remains stable. Continue SSI  Bioprosthetic AVR  Pruritis/rash right chest and back. No pain. - Sarna, Benadryl  DVT prophylaxis: SCDs Code Status: DNR Family Communication: daughter at bedside Disposition Plan: pending    Murray Hodgkins, MD  [facility] Direct contact: [phone] --[place]ia [place]  --[email or web address]; password [email or web address]  7PM-7AM contact night coverage as above 04/18/2017, 11:54 AM  LOS: 8 days   Consultants:  GI  Nephrology  PMT  Procedures: Left IJ tunneled HD catheter (28 cm Palindrome) with catheter tip in the mid RA  Antimicrobials:    Interval history/Subjective: Complains of pruritis right chest and back.   Objective: Vitals:  Vitals:   04/18/17 0513 04/18/17 0924  BP: (!) 144/55 (!) 137/50  Pulse: 79   Resp: 20   Temp: 97.9 F  (36.6 C)   SpO2: 96%     Exam:   Constitutional:   . Appears anxious, uncomfortable, non-toxic Respiratory:  . CTA bilaterally, no w/r/r.  . Respiratory effort mildly increased Cardiovascular:  . RRR, no m/r/g . No LE extremity edema   Skin:  . Erythematous rash, confluent, right chest wall, separate rash mid-thoracic back. No raised lesions.  . palpation of skin: no induration or nodules. Non-tender. Psychiatric:  . Mental status o Mood appears noraml, anxious affect   I have personally reviewed the following:   Labs:  CBG stable  BMP noted  Hgb stable 7.5  Plts improved, 102  Scheduled Meds: . buPROPion  150 mg Oral Daily  . doxazosin  4 mg Oral Daily  . ferrous sulfate  325 mg Oral BID  . finasteride  5 mg Oral QHS  . fluticasone  1 spray Each Nare BID  . insulin aspart  0-5 Units Subcutaneous QHS  . insulin aspart  0-9 Units Subcutaneous TID WC  . loratadine  10 mg Oral Daily  . Melatonin  4.5 mg Oral QHS  . metoprolol succinate  12.5 mg Oral BID WC  . oxymetazoline  2 spray Right Nare UD  . pantoprazole  40 mg Oral Daily  . simvastatin  20 mg Oral QPM  . sodium chloride flush  3 mL Intravenous Q12H  . umeclidinium-vilanterol  1 puff Inhalation Daily   Continuous Infusions: . sodium chloride    . sodium chloride    . sodium chloride    .  ceFAZolin (ANCEF) IV    . ferric gluconate (FERRLECIT/NULECIT) IV  Principal Problem:   Acute renal failure superimposed on stage 3 chronic kidney disease (HCC) Active Problems:   Essential hypertension   PVD, Rt SFA PTA/HSRA 06/17/11   AS (aortic stenosis)- s/p tissue AVR 01/07/11   Thrombocytopenia (HCC)   Chronic atrial fibrillation (HCC)   COPD (chronic obstructive pulmonary disease) (HCC)   Acute on chronic diastolic (congestive) heart failure (HCC)   Diabetes mellitus type 2, uncontrolled ([place])   Acute blood loss anemia   GI bleed   Goals of care, counseling/discussion   DNR (do not resuscitate)  discussion   Palliative care encounter   LOS: 8 days

## 2017-04-19 ENCOUNTER — Inpatient Hospital Stay (HOSPITAL_COMMUNITY): Payer: PPO

## 2017-04-19 DIAGNOSIS — E118 Type 2 diabetes mellitus with unspecified complications: Secondary | ICD-10-CM

## 2017-04-19 LAB — BPAM RBC
BLOOD PRODUCT EXPIRATION DATE: 201903262359
Blood Product Expiration Date: 201903262359
ISSUE DATE / TIME: 201902221632
ISSUE DATE / TIME: 201902221632
UNIT TYPE AND RH: 5100
Unit Type and Rh: 5100

## 2017-04-19 LAB — TYPE AND SCREEN
ABO/RH(D): O POS
ANTIBODY SCREEN: NEGATIVE
UNIT DIVISION: 0
Unit division: 0

## 2017-04-19 LAB — GLUCOSE, CAPILLARY
Glucose-Capillary: 159 mg/dL — ABNORMAL HIGH (ref 65–99)
Glucose-Capillary: 136 mg/dL — ABNORMAL HIGH (ref 65–99)
Glucose-Capillary: 188 mg/dL — ABNORMAL HIGH (ref 65–99)
Glucose-Capillary: 110 mg/dL — ABNORMAL HIGH (ref 65–99)
Glucose-Capillary: 175 mg/dL — ABNORMAL HIGH (ref 65–99)

## 2017-04-19 MED ORDER — ALBUTEROL SULFATE (2.5 MG/3ML) 0.083% IN NEBU
2.5000 mg | INHALATION_SOLUTION | Freq: Once | RESPIRATORY_TRACT | Status: AC
Start: 2017-04-19 — End: 2017-04-19
  Administered 2017-04-19: 2.5 mg via RESPIRATORY_TRACT
  Filled 2017-04-19: qty 3

## 2017-04-19 NOTE — Progress Notes (Signed)
PROGRESS NOTE  Ronald [name] UKG:[id number] DOB: [birth year] DOA: 04/10/2017 PCP: [name], MD  Brief Narrative: 89yom presented with SOB, tarry stools. Admitted for acute on chronic diastolic CHF, acute GIB, AKI, elevated INR on warfarin. Seen by GI but Hgb remained stable. Warfarin stopped. GI recommended no intervention and signed off. Seen by nephrology with initial rec for conservative care, no HD. Subsequently patient opted for HD and was transferred to [facility] 2/20. HD started 2/21  Assessment/Plan Acute renal failure, progressive, suspected GN.  -Continue hemodialysis per nephrology  Acute on chronic diastolic CHF, did not respond well to diuretics.   - Continue volume management per nephrology - continue Toprol-XL  Melena with high INR on admission -Hemoglobin has been stable. GI recommended conservative care and signed off.  Protonix - continue Protonix   ABLA superimposed on anemia of chronic disease - Hgb remained stable  Thrombocytopenia - chronic, stable  COPD -Remains stable.  Continue nebs, Anoro ellipta  Chronic atrial fibrillation -Remains stable.  Continue Toprol.   - Warfarin discontinued   DM type 2 on insulin  -Remains stable.  Continue SSI  Bioprosthetic AVR  Pruritis/rash right chest and back. No pain. -Resolving. Continue Sarna, Benadryl as needed  DVT prophylaxis: SCDs Code Status: DNR Family Communication: None present Disposition Plan: pending    Murray Hodgkins, MD  [facility] Direct contact: [phone] --[place]ia [place]  --[email or web address]; password [email or web address]  7PM-7AM contact night coverage as above 04/19/2017, 12:53 PM  LOS: 9 days   Consultants:  GI  Nephrology  PMT  Procedures: Left IJ tunneled HD catheter (28 cm Palindrome) with catheter tip in the mid RA  Antimicrobials:    Interval history/Subjective: Feels short of breath.  Pruritus is gone.  Objective: Vitals:  Vitals:   04/19/17 0624 04/19/17 1115  BP:  135/60   Pulse: 77   Resp: 18   Temp: 97.9 F (36.6 C)   SpO2: 99% 97%    Exam:  Constitutional:   . Appears anxious and mildly uncomfortable when awakened ENMT:  . Hard of hearing.  Hears best from right ear. Respiratory:  . CTA bilaterally, no w/r/r.  . Respiratory effort normal Cardiovascular:  . RRR, no m/r/g . No LE extremity edema   Skin:  . Macular red rash right upper chest is fading.  Rash right upper back rapidly resolving. Psychiatric:  . Mental status o Mood difficult to assess, affect anxious  I have personally reviewed the following:   Labs:  Blood sugars stable  Scheduled Meds: . buPROPion  150 mg Oral Daily  . darbepoetin (ARANESP) injection - DIALYSIS  100 mcg Intravenous Q Sat-HD  . doxazosin  4 mg Oral Daily  . ferrous sulfate  325 mg Oral BID  . finasteride  5 mg Oral QHS  . fluticasone  1 spray Each Nare BID  . insulin aspart  0-5 Units Subcutaneous QHS  . insulin aspart  0-9 Units Subcutaneous TID WC  . loratadine  10 mg Oral Daily  . Melatonin  4.5 mg Oral QHS  . metoprolol succinate  12.5 mg Oral BID WC  . oxymetazoline  2 spray Right Nare UD  . pantoprazole  40 mg Oral Daily  . simvastatin  20 mg Oral QPM  . sodium chloride flush  3 mL Intravenous Q12H  . umeclidinium-vilanterol  1 puff Inhalation Daily   Continuous Infusions: . sodium chloride    . sodium chloride    . ferric gluconate (FERRLECIT/NULECIT) IV  Principal Problem:   Acute renal failure superimposed on stage 3 chronic kidney disease (HCC) Active Problems:   Essential hypertension   PVD, Rt SFA PTA/HSRA 06/17/11   AS (aortic stenosis)- s/p tissue AVR 01/07/11   Thrombocytopenia (HCC)   Chronic atrial fibrillation (HCC)   COPD (chronic obstructive pulmonary disease) (HCC)   Acute on chronic diastolic (congestive) heart failure (HCC)   Diabetes mellitus type 2, uncontrolled ([place])   Acute blood loss anemia   GI bleed   Goals of care, counseling/discussion    DNR (do not resuscitate) discussion   Palliative care encounter   LOS: 9 days

## 2017-04-19 NOTE — Progress Notes (Signed)
[facility] Progress Note  Subjective: Patient itching is somewhat better.  Not sure if he feels any better on dialysis or not.     Vitals:   04/19/17 0232 04/19/17 0300 04/19/17 0624 04/19/17 1115  BP:   135/60   Pulse:   77   Resp:   18   Temp:   97.9 F (36.6 C)   TempSrc:   Oral   SpO2: 98%  99% 97%  Weight:  74.1 kg (163 lb 5.8 oz)    Height:        Inpatient medications: . buPROPion  150 mg Oral Daily  . darbepoetin (ARANESP) injection - DIALYSIS  100 mcg Intravenous Q Sat-HD  . doxazosin  4 mg Oral Daily  . ferrous sulfate  325 mg Oral BID  . finasteride  5 mg Oral QHS  . fluticasone  1 spray Each Nare BID  . insulin aspart  0-5 Units Subcutaneous QHS  . insulin aspart  0-9 Units Subcutaneous TID WC  . loratadine  10 mg Oral Daily  . Melatonin  4.5 mg Oral QHS  . metoprolol succinate  12.5 mg Oral BID WC  . oxymetazoline  2 spray Right Nare UD  . pantoprazole  40 mg Oral Daily  . simvastatin  20 mg Oral QPM  . sodium chloride flush  3 mL Intravenous Q12H  . umeclidinium-vilanterol  1 puff Inhalation Daily   . sodium chloride    . sodium chloride    . ferric gluconate (FERRLECIT/NULECIT) IV     acetaminophen **OR** acetaminophen, albuterol, camphor-menthol, diphenhydrAMINE, ondansetron **OR** ondansetron (ZOFRAN) IV, polyethylene glycol, traMADol  Exam: Gen: elderly WM, no distress Neck:  +JVD Lungs:  bilat crackles at bases Heart:  Regular rate and rhythm; systolic murmur Abdomen:  Soft, nontender and nondistended Ext:  no edema Neuro: mild asterixis, Ox 3  UA - +rbc's/ wbc, gran casts in sediment, nondysmorphic RBC's    Assessment:  1. Renal failure - progressive, suspected GN (+prot/ rbc's in urine) vs possible TMA w borderline low plts.  Serologies were negative.  NO medications implicated. Poor candidate for renal biopsy/ pheresis/ immunosuppression.  Pt active, working.  Plan is for trial of dialysis,  pt request.  Will need CLIP  Monday.  VVS consulted.  2. Volume - slight vol excess, improving, repeat CXR 3. Anemia d/t renal failure - sp darbe 100 ug on 2/16, will give 500 mg IV Fe over next several days; hb still low at 7.5, will give 2 u prbc on HD today 4. HTN - on metop/ cardura 5. DNR 6. DM2 7. Thrombocytopenia: plts 80- 100 range  Plan - HD today #3, consulted VVS for AVF   Kelly Splinter MD [facility] pgr [phone]   04/17/2017, 1:12 PM   Recent Labs  Lab 04/16/17 0443 04/17/17 1525 04/18/17 0806  NA 141 136 137  K 4.3 4.1 3.8  CL 107 104 103  CO2 22 22 24   GLUCOSE 145* 236* 157*  BUN 92* 87* 55*  CREATININE 5.65* 5.55* 4.42*  CALCIUM 8.4* 8.2* 8.3*   Recent Labs  Lab 04/14/17 0511 04/15/17 0427  AST 13* 15  ALT 11* 11*  ALKPHOS 71 74  BILITOT 0.5 0.5  PROT 5.7* 5.6*  ALBUMIN 2.5* 2.6*   Recent Labs  Lab 04/15/17 0427 04/15/17 1442 04/16/17 0443 04/18/17 0806  WBC 6.6  --  5.6 7.2  HGB 7.8* 7.8* 7.3* 7.5*  HCT 23.7* 23.4* 21.2* 22.4*  MCV 91.5  --  91.4 92.6  PLT 89*  --  88* 102*   Iron/TIBC/Ferritin/ %Sat    Component Value Date/Time   IRON 35 (L) 04/12/2017 1648   TIBC 162 (L) 04/12/2017 1648   FERRITIN 77 04/12/2017 1648   IRONPCTSAT 22 04/12/2017 1648   IRONPCTSAT 28 03/13/2006 1323

## 2017-04-20 DIAGNOSIS — J449 Chronic obstructive pulmonary disease, unspecified: Secondary | ICD-10-CM

## 2017-04-20 LAB — BASIC METABOLIC PANEL
Anion gap: 10 (ref 5–15)
BUN: 20 mg/dL (ref 6–20)
CHLORIDE: 97 mmol/L — AB (ref 101–111)
CO2: 27 mmol/L (ref 22–32)
CREATININE: 2.94 mg/dL — AB (ref 0.61–1.24)
Calcium: 8.2 mg/dL — ABNORMAL LOW (ref 8.9–10.3)
GFR calc Af Amer: 20 mL/min — ABNORMAL LOW (ref >60)
GFR calc non Af Amer: 17 mL/min — ABNORMAL LOW (ref >60)
Glucose, Bld: 136 mg/dL — ABNORMAL HIGH (ref 65–99)
POTASSIUM: 3.5 mmol/L (ref 3.5–5.1)
SODIUM: 134 mmol/L — AB (ref 135–145)

## 2017-04-20 LAB — GLUCOSE, CAPILLARY
Glucose-Capillary: 134 mg/dL — ABNORMAL HIGH (ref 65–99)
Glucose-Capillary: 159 mg/dL — ABNORMAL HIGH (ref 65–99)
Glucose-Capillary: 159 mg/dL — ABNORMAL HIGH (ref 65–99)
Glucose-Capillary: 149 mg/dL — ABNORMAL HIGH (ref 65–99)

## 2017-04-20 LAB — CBC
HEMATOCRIT: 28.3 % — AB (ref 39.0–52.0)
Hemoglobin: 9.1 g/dL — ABNORMAL LOW (ref 13.0–17.0)
MCH: 29.7 pg (ref 26.0–34.0)
MCHC: 32.2 g/dL (ref 30.0–36.0)
MCV: 92.5 fL (ref 78.0–100.0)
Platelets: 96 10*3/uL — ABNORMAL LOW (ref 150–400)
RBC: 3.06 MIL/uL — AB (ref 4.22–5.81)
RDW: 17.2 % — AB (ref 11.5–15.5)
WBC: 6.8 10*3/uL (ref 4.0–10.5)

## 2017-04-20 MED ORDER — DARBEPOETIN ALFA 100 MCG/0.5ML IJ SOSY
100.0000 ug | PREFILLED_SYRINGE | INTRAMUSCULAR | Status: DC
  Administered 2017-04-21: 100 ug via INTRAVENOUS
  Filled 2017-04-20: qty 0.5

## 2017-04-20 NOTE — Consult Note (Signed)
   Consult from [name] for placement of permanent hd access as he already has left IJ tunneled catheter. Exam and interview were complicated by patient difficulty hearing and repeated desire to be discharged today. Vein mapping has been ordered, patient is right hand dominant. If he stays and is willing to undergo permanent hd access then we can formally consult and plan for surgery early this week. If not, he can f/u in office for further planning and discussion.  Lezette Kitts C. Donzetta Matters, MD [facility] Office: [phone] Pager: [phone]

## 2017-04-20 NOTE — Progress Notes (Signed)
[facility] Progress Note  Subjective: C/o hearing loss today, abrupt per patient.  Communicated by writing notes.  CXR yest showing persistent R sided edema, L clear.       Vitals:   04/20/17 0448 04/20/17 0500 04/20/17 0823 04/20/17 0848  BP: (!) 146/59   (!) 158/82  Pulse: 71   86  Resp: 16   18  Temp: 98 F (36.7 C)   98.3 F (36.8 C)  TempSrc:      SpO2: 95%  98% 94%  Weight:  76.7 kg (169 lb 1.5 oz)    Height:        Inpatient medications: . buPROPion  150 mg Oral Daily  . darbepoetin (ARANESP) injection - DIALYSIS  100 mcg Intravenous Q Sat-HD  . doxazosin  4 mg Oral Daily  . ferrous sulfate  325 mg Oral BID  . finasteride  5 mg Oral QHS  . fluticasone  1 spray Each Nare BID  . insulin aspart  0-5 Units Subcutaneous QHS  . insulin aspart  0-9 Units Subcutaneous TID WC  . loratadine  10 mg Oral Daily  . Melatonin  4.5 mg Oral QHS  . metoprolol succinate  12.5 mg Oral BID WC  . oxymetazoline  2 spray Right Nare UD  . pantoprazole  40 mg Oral Daily  . simvastatin  20 mg Oral QPM  . sodium chloride flush  3 mL Intravenous Q12H  . umeclidinium-vilanterol  1 puff Inhalation Daily   . sodium chloride    . sodium chloride    . ferric gluconate (FERRLECIT/NULECIT) IV     acetaminophen **OR** acetaminophen, albuterol, camphor-menthol, diphenhydrAMINE, ondansetron **OR** ondansetron (ZOFRAN) IV, polyethylene glycol, traMADol  Exam: Gen: elderly WM, no distress Neck:  +JVD Lungs:  R basilar crackles, L clear Heart:  Regular rate and rhythm; systolic murmur Abdomen:  Soft, nontender and nondistended Ext:  no edema Neuro: mild asterixis, Ox 3  UA - +rbc's/ wbc in sheets , gran casts in sediment, nondysmorphic RBC's, no rbc casts noted  Assessment:  1. Renal failure - progressive, uremic, started on HD last week, unclear cause (TTP, GN). Pt active, working, requested trial of HD. Will see if he will tolerate. HD #4 Monday. Vein map ordered in case will  need AVF.  2.  Vol / pulm edema - cont to lower vol w HD tomorrow 3. Anemia d/t renal failure - sp darbe 100 ug on 2/16, 500 mg IV Fe load in prog, sp 2u prbc on 2/22 4. HTN - on metop/ cardura 5. DNR 6. DM2 7. Thrombocytopenia: plts improved a bit 8. Hearing loss - acute, no getting usual suspects   Plan - HD Monday, get vol down, as above   Kelly Splinter MD [facility] pgr [phone]   04/17/2017, 1:12 PM   Recent Labs  Lab 04/16/17 0443 04/17/17 1525 04/18/17 0806  NA 141 136 137  K 4.3 4.1 3.8  CL 107 104 103  CO2 22 22 24   GLUCOSE 145* 236* 157*  BUN 92* 87* 55*  CREATININE 5.65* 5.55* 4.42*  CALCIUM 8.4* 8.2* 8.3*   Recent Labs  Lab 04/14/17 0511 04/15/17 0427  AST 13* 15  ALT 11* 11*  ALKPHOS 71 74  BILITOT 0.5 0.5  PROT 5.7* 5.6*  ALBUMIN 2.5* 2.6*   Recent Labs  Lab 04/15/17 0427 04/15/17 1442 04/16/17 0443 04/18/17 0806  WBC 6.6  --  5.6 7.2  HGB 7.8* 7.8* 7.3* 7.5*  HCT 23.7*  23.4* 21.2* 22.4*  MCV 91.5  --  91.4 92.6  PLT 89*  --  88* 102*   Iron/TIBC/Ferritin/ %Sat    Component Value Date/Time   IRON 35 (L) 04/12/2017 1648   TIBC 162 (L) 04/12/2017 1648   FERRITIN 77 04/12/2017 1648   IRONPCTSAT 22 04/12/2017 1648   IRONPCTSAT 28 03/13/2006 1323

## 2017-04-20 NOTE — Progress Notes (Signed)
PROGRESS NOTE  Ronald [name]:[id number] DOB: 01-Aug-1922 DOA: 04/10/2017 PCP: [name], MD  Brief Narrative: 89yom presented with SOB, tarry stools. Admitted for acute on chronic diastolic CHF, acute GIB, AKI, elevated INR on warfarin. Seen by GI but Hgb remained stable. Warfarin stopped. GI recommended no intervention and signed off. Seen by nephrology with initial rec for conservative care, no HD. Subsequently patient opted for HD and was transferred to [facility] 2/20. HD started 2/21  Assessment/Plan Acute renal failure, progressive, suspected GN.  -Continue hemodialysis  Acute on chronic diastolic CHF, did not respond well to diuretics.   -Continue volume management per nephrology.  Chest x-ray yesterday showed increased edema on the right, stable or improved on the left.  However clinically patient is breathing much more comfortably today. -Continue Toprol-XL  Melena with high INR on admission -Hemoglobin remains stable. GI recommended conservative care and signed off.   - continue Protonix   ABLA superimposed on anemia of chronic disease - Hgb stable  Thrombocytopenia - chronic, stable  COPD -Remains stable.  Continue nebs, Anoro ellipta  Chronic atrial fibrillation -Remains stable.  Continue Toprol.   - Warfarin discontinued   DM type 2 on insulin  -Remains stable.  Continue SSI  Bioprosthetic AVR   DVT prophylaxis: SCDs Code Status: DNR Family Communication: None present Disposition Plan: pending    Murray Hodgkins, MD  [facility] Direct contact: [phone] --[place] [place]  --[email or web address]; password [email or web address]  7PM-7AM contact night coverage as above 04/20/2017, 4:23 PM  LOS: 10 days   Consultants:  GI  Nephrology  PMT  Procedures: Left IJ tunneled HD catheter (28 cm Palindrome) with catheter tip in the mid RA  Antimicrobials:    Interval history/Subjective: Apparently had some difficulty hearing very today but currently doing fine.   Hopes to go home soon.  Objective: Vitals:  Vitals:   04/20/17 0848 04/20/17 1330  BP: (!) 158/82 (!) 150/52  Pulse: 86 (!) 14  Resp: 18 15  Temp: 98.3 F (36.8 C) 97.8 F (36.6 C)  SpO2: 94% 97%    Exam:  Constitutional:   . Appears mildly anxious, comfortable. Respiratory:  . CTA bilaterally, no w/r/r.  . Respiratory effort normal Cardiovascular:  . RRR, no m/r/g . No LE extremity edema   Psychiatric:  . Mental status o Mood, affect appropriate . May be slightly confused, seems to be quite aware of what is going well.    I have personally reviewed the following:   Labs:  Blood sugars remain stable  BMP unremarkable  Hemoglobin up to 9.1.  Platelets stable 96.  Scheduled Meds: . buPROPion  150 mg Oral Daily  . [START ON 04/21/2017] darbepoetin (ARANESP) injection - DIALYSIS  100 mcg Intravenous Q Mon-HD  . doxazosin  4 mg Oral Daily  . ferrous sulfate  325 mg Oral BID  . finasteride  5 mg Oral QHS  . fluticasone  1 spray Each Nare BID  . insulin aspart  0-5 Units Subcutaneous QHS  . insulin aspart  0-9 Units Subcutaneous TID WC  . loratadine  10 mg Oral Daily  . Melatonin  4.5 mg Oral QHS  . metoprolol succinate  12.5 mg Oral BID WC  . oxymetazoline  2 spray Right Nare UD  . pantoprazole  40 mg Oral Daily  . simvastatin  20 mg Oral QPM  . sodium chloride flush  3 mL Intravenous Q12H  . umeclidinium-vilanterol  1 puff Inhalation Daily   Continuous Infusions: . sodium  chloride    . sodium chloride    . ferric gluconate (FERRLECIT/NULECIT) IV      Principal Problem:   Acute renal failure superimposed on stage 3 chronic kidney disease (HCC) Active Problems:   Essential hypertension   PVD, Rt SFA PTA/HSRA 06/17/11   AS (aortic stenosis)- s/p tissue AVR 01/07/11   Thrombocytopenia (HCC)   Chronic atrial fibrillation (HCC)   COPD (chronic obstructive pulmonary disease) (HCC)   Acute on chronic diastolic (congestive) heart failure (HCC)   Diabetes  mellitus type 2, uncontrolled (August)   Acute blood loss anemia   GI bleed   Goals of care, counseling/discussion   DNR (do not resuscitate) discussion   Palliative care encounter   LOS: 10 days

## 2017-04-21 ENCOUNTER — Ambulatory Visit (HOSPITAL_COMMUNITY): Payer: PPO

## 2017-04-21 DIAGNOSIS — N179 Acute kidney failure, unspecified: Secondary | ICD-10-CM

## 2017-04-21 DIAGNOSIS — I482 Chronic atrial fibrillation: Secondary | ICD-10-CM

## 2017-04-21 LAB — CBC
HCT: 26.8 % — ABNORMAL LOW (ref 39.0–52.0)
Hemoglobin: 8.6 g/dL — ABNORMAL LOW (ref 13.0–17.0)
MCH: 29.7 pg (ref 26.0–34.0)
MCHC: 32.1 g/dL (ref 30.0–36.0)
MCV: 92.4 fL (ref 78.0–100.0)
Platelets: 104 10*3/uL — ABNORMAL LOW (ref 150–400)
RBC: 2.9 MIL/uL — ABNORMAL LOW (ref 4.22–5.81)
RDW: 16.8 % — ABNORMAL HIGH (ref 11.5–15.5)
WBC: 8.2 10*3/uL (ref 4.0–10.5)

## 2017-04-21 LAB — BASIC METABOLIC PANEL
ANION GAP: 12 (ref 5–15)
BUN: 28 mg/dL — ABNORMAL HIGH (ref 6–20)
CO2: 23 mmol/L (ref 22–32)
Calcium: 8.4 mg/dL — ABNORMAL LOW (ref 8.9–10.3)
Chloride: 99 mmol/L — ABNORMAL LOW (ref 101–111)
Creatinine, Ser: 3.69 mg/dL — ABNORMAL HIGH (ref 0.61–1.24)
GFR calc Af Amer: 15 mL/min — ABNORMAL LOW (ref >60)
GFR, EST NON AFRICAN AMERICAN: 13 mL/min — AB (ref >60)
Glucose, Bld: 134 mg/dL — ABNORMAL HIGH (ref 65–99)
POTASSIUM: 4.6 mmol/L (ref 3.5–5.1)
Sodium: 134 mmol/L — ABNORMAL LOW (ref 135–145)

## 2017-04-21 LAB — GLUCOSE, CAPILLARY
Glucose-Capillary: 138 mg/dL — ABNORMAL HIGH (ref 65–99)
Glucose-Capillary: 102 mg/dL — ABNORMAL HIGH (ref 65–99)
Glucose-Capillary: 139 mg/dL — ABNORMAL HIGH (ref 65–99)
Glucose-Capillary: 216 mg/dL — ABNORMAL HIGH (ref 65–99)

## 2017-04-21 MED ORDER — HEPARIN SODIUM (PORCINE) 1000 UNIT/ML DIALYSIS: 2500.0000 [IU] | INTRAMUSCULAR | Status: DC | PRN

## 2017-04-21 MED ORDER — SODIUM CHLORIDE 0.9 % IV SOLN: 125.0000 mg | INTRAVENOUS | Status: DC

## 2017-04-21 MED ORDER — DARBEPOETIN ALFA 100 MCG/0.5ML IJ SOSY
PREFILLED_SYRINGE | INTRAMUSCULAR | Status: AC
  Administered 2017-04-21: 100 ug via INTRAVENOUS
  Filled 2017-04-21: qty 0.5

## 2017-04-21 MED ORDER — RENA-VITE PO TABS
1.0000 | ORAL_TABLET | Freq: Every day | ORAL | Status: DC
  Administered 2017-04-21 – 2017-04-25 (×5): 1 via ORAL
  Filled 2017-04-21 (×5): qty 1

## 2017-04-21 NOTE — Procedures (Signed)
I was present at this session.  I have reviewed the session itself and made appropriate changes.  HD via [facility] PC. bp fell so less UF.  tol well now.    Jeneen Rinks Ronald [name] 2/25/201911:53 AM

## 2017-04-21 NOTE — Progress Notes (Signed)
PT Cancellation Note  Patient Details Name: Ronald [name] MRN: [id number] DOB: Jun 11, 1922   Cancelled Treatment:    Reason Eval/Treat Not Completed: Patient at procedure or test/unavailable. Pt in HD. PT to re-attempt as time allows.   Lorriane Shire 04/21/2017, 9:07 AM

## 2017-04-21 NOTE — Progress Notes (Signed)
Physical Therapy Treatment Patient Details Name: Ronald [name] MRN: [id number] DOB: 1922-06-08 Today's Date: 04/21/2017    History of Present Illness Pt is a 84 y.o. male who comes in with increasing shortness of breath, Hx significant for peripheral vascular disease status post vent placement, renal artery stenosis that is post angioplasty with stent and rotational atherectomy in 3151, diastolic heart failure (last echo 03/12/2017), stage III sent chronic kidney disease, hypertension, paroxysmal atrial fibrillation on warfarin, COPD, aortic stenosis status post bovine AV valve replacement in 01/07/2011, hyperlipidemia.    PT Comments    Continuing work on functional mobility and activity tolerance;  Very fatigued post HD, but wanting to try;  Lightheaded with upright activity; Lengthy discussion with pt and daughter that he will need increased care services at [facility] -- which [facility] believe [facility] can provide, and he very much wants to get back there; ; His daughter tells me she will follow up with that; worth considering HHSW follow up, too, in case [facility] is unable to meet Ronald [name] needs  Follow Up Recommendations  Home health PT and OT;Other (comment)(and incr services at ALF; if [facility] can't meet pt's needs, consider SNF)     Equipment Recommendations  Rolling walker with 5" wheels;3in1 (PT)    Recommendations for Other Services       Precautions / Restrictions Precautions Precautions: Fall Precaution Comments: watch for lightheadedness post HD Restrictions Weight Bearing Restrictions: No    Mobility  Bed Mobility Overal bed mobility: Needs Assistance Bed Mobility: Supine to Sit;Sit to Supine     Supine to sit: Mod assist Sit to supine: Min assist   General bed mobility comments: ModA for aiding trunk to upright in coming to seated EoB, minA for assisting LE into bed; ineffiecient and energetically taxing scoot to  EOB  Transfers Overall transfer level: Needs assistance Equipment used: Rolling walker (2 wheeled) Transfers: Sit to/from Stand Sit to Stand: Mod assist         General transfer comment: modA for power up and steadying in RW due to posterior lean  Ambulation/Gait             General Gait Details: Unable to walk today secondary to lightheadedness with standing   Stairs            Wheelchair Mobility    Modified Rankin (Stroke Patients Only)       Balance     Sitting balance-Leahy Scale: Fair       Standing balance-Leahy Scale: Poor Standing balance comment: requires RW and minA for balance with gait                            Cognition Arousal/Alertness: Awake/alert Behavior During Therapy: WFL for tasks assessed/performed Overall Cognitive Status: No family/caregiver present to determine baseline cognitive functioning                                        Exercises      General Comments General comments (skin integrity, edema, etc.): Session conducted on Room Air and O2 sats remained greater than or equal to 90%; Unable to get BP while pt was standing, he had to sit due to lightheadedness; BP sitting 125/44 HR 86      Pertinent Vitals/Pain Pain Assessment: Faces Faces Pain Scale: Hurts a little bit Pain Location:  Itching at back and chest Pain Descriptors / Indicators: Grimacing;Guarding Pain Intervention(s): Monitored during session(applied anti-itch lotion)    Home Living                      Prior Function            PT Goals (current goals can now be found in the care plan section) Acute Rehab PT Goals Patient Stated Goal: wants to get home PT Goal Formulation: With patient Time For Goal Achievement: 04/25/17 Potential to Achieve Goals: Good Progress towards PT goals: Not progressing toward goals - comment(very fatigued post HD)    Frequency    Min 3X/week      PT Plan Current plan  remains appropriate    Co-evaluation              AM-PAC PT "6 Clicks" Daily Activity  Outcome Measure  Difficulty turning over in bed (including adjusting bedclothes, sheets and blankets)?: A Little Difficulty moving from lying on back to sitting on the side of the bed? : A Lot Difficulty sitting down on and standing up from a chair with arms (e.g., wheelchair, bedside commode, etc,.)?: Unable Help needed moving to and from a bed to chair (including a wheelchair)?: A Little Help needed walking in hospital room?: A Lot Help needed climbing 3-5 steps with a railing? : Total 6 Click Score: 12    End of Session Equipment Utilized During Treatment: Gait belt Activity Tolerance: Patient limited by fatigue Patient left: in bed;with call bell/phone within reach;with family/visitor present Nurse Communication: Mobility status PT Visit Diagnosis: Muscle weakness (generalized) (M62.81);Difficulty in walking, not elsewhere classified (R26.2)     Time: 5597-4163 PT Time Calculation (min) (ACUTE ONLY): 26 min  Charges:  $Therapeutic Activity: 23-37 mins                    G Codes:       Roney Marion, PT  Acute Rehabilitation Services Pager [phone] Office [place] 04/21/2017, 4:51 PM

## 2017-04-21 NOTE — Progress Notes (Signed)
Right  Upper Extremity Vein Map  Cephalic - images 1-5 in [employer] - mislabeled left.  Segment Diameter Depth Comment  1. Axilla 3.52mm 7.38mm   2. Mid upper arm 4.22mm 3.16mm   3. Above AC 4.76mm 3.43mm   4. In [facility] 4.44mm 3.53mm   5. Below AC 3.65mm 4.83mm   6. Mid forearm 3.41mm 3.76mm Tape in place  7. Wrist 1.40mm mm    mm mm              Basilic  Segment Diameter Depth Comment  1. Axilla 6.57mm 8.80mm   2. Mid upper arm 4.66mm 13.64mm   3. Above AC 2.19mm 3.19mm   4. In AC 1.74mm mm   5. Below AC mm mm Not seen   6. Mid forearm mm mm Not seen  7. Wrist mm mm    mm mm    mm mm    mm mm    Left  Upper Extremity Vein Map  Cephalic -   Segment Diameter Depth Comment  1. Axilla mm mm Not seen  2. Mid upper arm mm mm Not seen  3. Above AC mm mm Not seen  4. In AC 1.45mm mm   5. Below AC 2.39mm mm   6. Mid forearm 1.61mm mm   7. Wrist mm mm Not seen                  Basilic  Segment Diameter Depth Comment  1. Axilla 3.82mm 13.30mm.   2. Mid upper arm 4.80mm 12.36mm   3. Above [facility] 3.81mm 12.21mm   4. In [facility] 3.19mm 10.12mm   5. Below AC 2.6 9.33mm   6. Mid forearm 2.60mm 9.15mm   7. Wrist mm mm

## 2017-04-21 NOTE — Progress Notes (Signed)
PROGRESS NOTE  Ronald [name] VOJ:[id number] DOB: 10-Jan-1923 DOA: 04/10/2017 PCP: [name], MD  Brief Narrative: 89yom presented with SOB, tarry stools. Admitted for acute on chronic diastolic CHF, acute GIB, AKI, elevated INR on warfarin. Seen by GI but Hgb remained stable. Warfarin stopped. GI recommended no intervention and signed off. Seen by nephrology with initial rec for conservative care, no HD. Subsequently patient opted for HD and was transferred to [facility] 2/20. HD started 2/21  Assessment/Plan Acute renal failure, progressive, suspected GN.  -Continue hemodialysis per nephrology  Acute on chronic diastolic CHF, did not respond well to diuretics.   -Appears stable.  Continue volume management per nephrology.   -Cont continue inue Toprol-XL  Melena with high INR on admission -Hemoglobin remains stable. GI recommended conservative care and signed off.   -Continue Protonix   ABLA superimposed on anemia of chronic disease - Hgb stable  Thrombocytopenia - chronic, stable  COPD -Stable.  Continue nebs, Anoro ellipta  Chronic atrial fibrillation -Stable.  ContinueToprol.   - Warfarin discontinued   DM type 2 on insulin  -remains stable.  Continue SSI  Bioprosthetic AVR   DVT prophylaxis: SCDs Code Status: DNR Family Communication: None present Disposition Plan: pending    Murray Hodgkins, MD  [facility] Direct contact: [phone] --[address] [place]  --[email or web address]; password [email or web address]  7PM-7AM contact night coverage as above 04/21/2017, 5:01 PM  LOS: 11 days   Consultants:  GI  Nephrology  PMT  Procedures: Left IJ tunneled HD catheter (28 cm Palindrome) with catheter tip in the mid RA  Antimicrobials:    Interval history/Subjective: Seems to feel better today.  Breathing better same.  Objective: Vitals:  Vitals:   04/21/17 1215 04/21/17 1315  BP: (!) 118/51 (!) 131/53  Pulse: 87   Resp: 20   Temp: (!) 97.4 F (36.3 C)   SpO2: 99%       Exam:  Constitutional:   . Appears calm and comfortable Respiratory:  . CTA bilaterally, no w/r/r.  . Respiratory effort normal Cardiovascular:  . RRR, no m/r/g . No LE extremity edema   Skin:  . Rash on right upper chest is fading Psychiatric:  . Mental status o Mood, affect appropriate  I have personally reviewed the following:   Labs:  Blood sugars remain stable.  Basic metabolic panel unremarkable.  Hemoglobin stable, 8.6.  Platelets stable, 104.  Scheduled Meds: . buPROPion  150 mg Oral Daily  . darbepoetin (ARANESP) injection - DIALYSIS  100 mcg Intravenous Q Mon-HD  . doxazosin  4 mg Oral Daily  . finasteride  5 mg Oral QHS  . fluticasone  1 spray Each Nare BID  . insulin aspart  0-5 Units Subcutaneous QHS  . insulin aspart  0-9 Units Subcutaneous TID WC  . loratadine  10 mg Oral Daily  . Melatonin  4.5 mg Oral QHS  . metoprolol succinate  12.5 mg Oral BID WC  . multivitamin  1 tablet Oral QHS  . oxymetazoline  2 spray Right Nare UD  . pantoprazole  40 mg Oral Daily  . simvastatin  20 mg Oral QPM  . sodium chloride flush  3 mL Intravenous Q12H  . umeclidinium-vilanterol  1 puff Inhalation Daily   Continuous Infusions: . ferric gluconate (FERRLECIT/NULECIT) IV Stopped (04/21/17 1321)    Principal Problem:   Acute renal failure superimposed on stage 3 chronic kidney disease (HCC) Active Problems:   Essential hypertension   PVD, Rt SFA PTA/HSRA 06/17/11   AS (aortic  stenosis)- s/p tissue AVR 01/07/11   Thrombocytopenia (HCC)   Chronic atrial fibrillation (HCC)   COPD (chronic obstructive pulmonary disease) (HCC)   Acute on chronic diastolic (congestive) heart failure (HCC)   Diabetes mellitus type 2, uncontrolled (HCC)   Acute blood loss anemia   GI bleed   Goals of care, counseling/discussion   DNR (do not resuscitate) discussion   Palliative care encounter   LOS: 11 days

## 2017-04-21 NOTE — Progress Notes (Signed)
Subjective: Interval History: has no complaint, not sure if he wants to cont will think about.  Objective: Vital signs in last 24 hours: Temp:  [97.8 F (36.6 C)-98.2 F (36.8 C)] 98 F (36.7 C) (02/25 0820) Pulse Rate:  [14-87] 77 (02/25 1115) Resp:  [15-18] 18 (02/25 0945) BP: (109-155)/(50-74) 134/63 (02/25 1115) SpO2:  [96 %-98 %] 98 % (02/25 1015) Weight:  [75.2 kg (165 lb 12.6 oz)] 75.2 kg (165 lb 12.6 oz) (02/25 0805) Weight change: -0.1 kg (-3.5 oz)  Intake/Output from previous day: 02/24 0701 - 02/25 0700 In: 120 [P.O.:120] Out: 100 [Urine:100] Intake/Output this shift: No intake/output data recorded.  General appearance: alert, cooperative, pale and very HOH Resp: clear to auscultation bilaterally Chest wall: LIJ cath Cardio: S1, S2 normal and systolic murmur: systolic ejection 2/6, decrescendo at 2nd left intercostal space GI: soft, non-tender; bowel sounds normal; no masses,  no organomegaly Extremities: extremities normal, atraumatic, no cyanosis or edema  Lab Results: Recent Labs    04/20/17 1358 04/21/17 0746  WBC 6.8 8.2  HGB 9.1* 8.6*  HCT 28.3* 26.8*  PLT 96* 104*   BMET:  Recent Labs    04/20/17 1358 04/21/17 0703  NA 134* 134*  K 3.5 4.6  CL 97* 99*  CO2 27 23  GLUCOSE 136* 134*  BUN 20 28*  CREATININE 2.94* 3.69*  CALCIUM 8.2* 8.4*   No results for input(s): PTH in the last 72 hours. Iron Studies: No results for input(s): IRON, TIBC, TRANSFERRIN, FERRITIN in the last 72 hours.  Studies/Results: Dg Chest Port 1 View  Result Date: 04/19/2017 CLINICAL DATA:  Pulmonary edema EXAM: PORTABLE CHEST 1 VIEW COMPARISON:  04/13/2017 FINDINGS: Left jugular dialysis catheter has been placed with its tip at the cavoatrial junction. Atrial appendage stable remains in place. Hazy hazy airspace disease compatible with pulmonary edema is stable on the left and worse in the right lung. Small right pleural effusion is not excluded. IMPRESSION: Bilateral  hazy pulmonary edema is stable on the left and worsening on the right. Small right pleural effusion is not excluded. Electronically Signed   By: Marybelle Killings M.D.   On: 04/19/2017 13:40    I have reviewed the patient's current medications.  Assessment/Plan: 1 CKD5 ? Some AKI.  AIN vs AGN,not candidate for invasive/immunosuppressives.  If to cont, needs AVF  2 HTN 3 Anemai 4 HPTh check 5 AVR 6 COPD 7 PVD 8 Carotid dz 9 DM  P HD, will counsel  45 min spent with patient, adjust meds. Give FE   LOS: 11 days   Jeneen Rinks Tanishka Drolet 04/21/2017,11:54 AM

## 2017-04-22 DIAGNOSIS — I509 Heart failure, unspecified: Secondary | ICD-10-CM

## 2017-04-22 DIAGNOSIS — N185 Chronic kidney disease, stage 5: Secondary | ICD-10-CM

## 2017-04-22 LAB — BASIC METABOLIC PANEL
Anion gap: 11 (ref 5–15)
BUN: 13 mg/dL (ref 6–20)
CO2: 27 mmol/L (ref 22–32)
CREATININE: 2.74 mg/dL — AB (ref 0.61–1.24)
Calcium: 8.4 mg/dL — ABNORMAL LOW (ref 8.9–10.3)
Chloride: 98 mmol/L — ABNORMAL LOW (ref 101–111)
GFR, EST AFRICAN AMERICAN: 21 mL/min — AB (ref >60)
GFR, EST NON AFRICAN AMERICAN: 19 mL/min — AB (ref >60)
Glucose, Bld: 127 mg/dL — ABNORMAL HIGH (ref 65–99)
POTASSIUM: 3.2 mmol/L — AB (ref 3.5–5.1)
Sodium: 136 mmol/L (ref 135–145)

## 2017-04-22 LAB — CBC
HCT: 28.2 % — ABNORMAL LOW (ref 39.0–52.0)
Hemoglobin: 9.1 g/dL — ABNORMAL LOW (ref 13.0–17.0)
MCH: 30.2 pg (ref 26.0–34.0)
MCHC: 32.3 g/dL (ref 30.0–36.0)
MCV: 93.7 fL (ref 78.0–100.0)
PLATELETS: 114 10*3/uL — AB (ref 150–400)
RBC: 3.01 MIL/uL — AB (ref 4.22–5.81)
RDW: 17.3 % — AB (ref 11.5–15.5)
WBC: 7.9 10*3/uL (ref 4.0–10.5)

## 2017-04-22 LAB — GLUCOSE, CAPILLARY
Glucose-Capillary: 141 mg/dL — ABNORMAL HIGH (ref 65–99)
Glucose-Capillary: 165 mg/dL — ABNORMAL HIGH (ref 65–99)
Glucose-Capillary: 188 mg/dL — ABNORMAL HIGH (ref 65–99)
Glucose-Capillary: 190 mg/dL — ABNORMAL HIGH (ref 65–99)

## 2017-04-22 MED ORDER — DOXAZOSIN MESYLATE 4 MG PO TABS: 4.0000 mg | ORAL_TABLET | Freq: Every day | ORAL | Status: DC

## 2017-04-22 MED ORDER — DOXAZOSIN MESYLATE 2 MG PO TABS
2.0000 mg | ORAL_TABLET | Freq: Every day | ORAL | Status: DC
  Administered 2017-04-23 – 2017-04-25 (×3): 2 mg via ORAL
  Filled 2017-04-22 (×3): qty 1

## 2017-04-22 NOTE — H&P (View-Only) (Signed)
Hospital Consult    Reason for Consult:  Need for permanent dialysis Requesting Physician:  Dr. Jimmy Footman MRN #:  [id number]  History of Present Illness: This is a 84 y.o. male with medical history significant for CHF, worsening renal function requiring dialysis, thrombocytopenia, atrial fibrillation, IDDM who was admitted for SOB and acute GI bleed.  Coumadin has been discontinued per GI.  Per Nephrology patient need long term hemodialysis.  Vein mapping has been performed which demonstrates adequate R cephalic and L basilic vein.  On exam, patient is noted to have local ecchymosis and bandaging to R AC fossa from venous puncture.  He is currently dialyzing from L IJ PermCath on MWF schedule.  Patient is anxiously anticipating discharge tomorrow 04/23/17.  Past Medical History:  Diagnosis Date  . AS (aortic stenosis)    bovine aortic valve replacement 01/07/11  . Bladder cancer [facility])    Bladder Cancer local  . Blood transfusion    w/hip operation  . Cellulitis of left lower extremity   . Chronic atrial fibrillation ([place])   . Chronic diastolic congestive heart failure ([place])   . Claudication in peripheral vascular disease ([place]) 06/17/2011  . COPD (chronic obstructive pulmonary disease) ([place])   . Depression wife died 4 years ago.    Marland Kitchen History of stomach ulcers ~ 1951  . HTN (hypertension)   . Neuropathy due to secondary diabetes ([place])   . Peripheral vascular disease ([place]) very poor circulation legs and feet ... stents right and left legs... done in dr j. Gwenlyn Found 's office.   . Pneumonia 07/25/11   left  . Recurrent upper respiratory infection (URI)    sinusitis  . Renal artery stenosis ([place]) 2006   renal artery stent  . S/P angioplasty with stent, diamond back rotational athrectomy Prox. Rt. SFA 06/17/2011 06/17/2011  . Shortness of breath   . Thrombocytopenia due to drugs    seen by Dr Inda Merlin plts [id number] no rx  . Type II diabetes mellitus ([place])     Past Surgical History:    Procedure Laterality Date  . ABDOMINAL ANGIOGRAM  06/17/2011   Procedure: ABDOMINAL ANGIOGRAM;  Surgeon: Lorretta Harp, MD;  Location: [facility] CATH LAB;  Service: Cardiovascular;;  . AORTIC VALVE REPLACEMENT  01/07/2011   Procedure: AORTIC VALVE REPLACEMENT (AVR);  Surgeon: Grace Isaac, MD;  Location: [place];  Service: Open Heart Surgery;  Laterality: N/A;; magna-ease bovine 66mm bioprosthesis  . ATHERECTOMY N/A 06/17/2011   Procedure: ATHERECTOMY;  Surgeon: Lorretta Harp, MD;  Location: [facility] CATH LAB;  Service: Cardiovascular;  Laterality: N/A;  . CARDIAC CATHETERIZATION  11/19/10   normal coronaries, mod AS, 75% l RAS  . CARDIOVERSION  04/03/2011   Procedure: CARDIOVERSION;  Surgeon: Leonie Man, MD;  Location: [place];  Service: Cardiovascular;  Laterality: N/A;  . CATARACT EXTRACTION W/ INTRAOCULAR LENS  IMPLANT, BILATERAL  ~ 2007  . CHOLECYSTECTOMY    . FEMUR IM NAIL  06/27/2011   Procedure: INTRAMEDULLARY (IM) NAIL FEMORAL;  Surgeon: Marin Shutter, MD;  Location: [facility] ORS;  Service: Orthopedics;  Laterality: Right;  . FEMUR IM NAIL Left 12/14/2013   Procedure: INTRAMEDULLARY (IM) NAIL FEMORAL;  Surgeon: Mauri Pole, MD;  Location: [facility] ORS;  Service: Orthopedics;  Laterality: Left;  . FRACTURE SURGERY    . IR FLUORO GUIDE CV LINE LEFT  04/17/2017  . IR US GUIDE VASC ACCESS LEFT  04/17/2017  . LOWER EXTREMITY ANGIOGRAM  06/17/2011   diamondback orbital rotational and cutting balloon  atherectomy of the prox R SFA  . LOWER EXTREMITY ANGIOGRAM Bilateral 06/17/2011   Procedure: LOWER EXTREMITY ANGIOGRAM;  Surgeon: Lorretta Harp, MD;  Location: [facility] CATH LAB;  Service: Cardiovascular;  Laterality: Bilateral;  . PERIPHERAL ARTERIAL STENT GRAFT     2006 left anf right illiac stents Dr Deon Pilling  . RENAL ARTERY STENT  2006   "I believe"  . REVERSE SHOULDER ARTHROPLASTY Left 12/31/2016   Procedure: LEFT REVERSE SHOULDER ARTHROPLASTY;  Surgeon: Nicholes Stairs, MD;  Location: [place];  Service:  Orthopedics;  Laterality: Left;  . TONSILLECTOMY AND ADENOIDECTOMY     "when I was a kid"    Allergies  Allergen Reactions  . Aspirin Anaphylaxis, Shortness Of Breath and Rash    "broke out in white welts; red blotches neck and face; windpipe closing; ~ 1962"    Prior to Admission medications   Medication Sig Start Date End Date Taking? Authorizing Provider  acetaminophen (TYLENOL) 325 MG tablet Take 650 mg by mouth every 6 (six) hours as needed for mild pain.   Yes [provider]  albuterol (ACCUNEB) 0.63 MG/3ML nebulizer solution Take 3 mLs by nebulization every 6 (six) hours as needed for wheezing or shortness of breath.   Yes [provider]  alendronate (FOSAMAX) 70 MG tablet Take 70 mg by mouth every 14 (fourteen) days. Take every other sunday 12/14/16  Yes [provider]  benazepril (LOTENSIN) 20 MG tablet Take 20 mg by mouth daily.   Yes [provider]  buPROPion (WELLBUTRIN XL) 150 MG 24 hr tablet Take 150 mg by mouth daily.    Yes [provider]  doxazosin (CARDURA) 4 MG tablet Take 4 mg by mouth daily.    Yes Collins, Gina L, PA-C  ferrous sulfate 325 (65 FE) MG EC tablet Take 1 tablet (325 mg total) by mouth 2 (two) times daily. 07/04/16 07/04/17 Yes Lavina Hamman, MD  finasteride (PROSCAR) 5 MG tablet Take 5 mg by mouth at bedtime.    Yes [provider]  fluticasone (FLONASE) 50 MCG/ACT nasal spray Place 1 spray into both nostrils 2 (two) times daily.    Yes [provider]  furosemide (LASIX) 40 MG tablet Take 1 tablet (40 mg total) by mouth daily. 07/04/16  Yes Lavina Hamman, MD  glyBURIDE (DIABETA) 5 MG tablet Take 2.5-10 mg by mouth See admin instructions. 10mg  by mouth in the morning. 2.5mg  in the evening   Yes [provider]  Insulin Glargine (LANTUS) 100 UNIT/ML Solostar Pen Inject 10 Units into the skin daily.    Yes [provider]  loratadine (CLARITIN) 10 MG tablet Take 10 mg by  mouth daily.   Yes [provider]  Melatonin 5 MG TABS Take 5 mg by mouth at bedtime.   Yes [provider]  metoprolol succinate (TOPROL-XL) 50 MG 24 hr tablet Take 50 mg by mouth 2 (two) times daily. Take with or immediately following a meal.   Yes [provider]  polyethylene glycol (MIRALAX / GLYCOLAX) packet Take 17 g by mouth daily. 12/25/16  Yes Mariel Aloe, MD  potassium chloride SA (K-DUR,KLOR-CON) 20 MEQ tablet Take 20 mEq by mouth daily.    Yes [provider]  simvastatin (ZOCOR) 20 MG tablet Take 20 mg by mouth every evening.   Yes [provider]  traMADol (ULTRAM) 50 MG tablet Take 1 tablet (50 mg total) by mouth every 12 (twelve) hours as needed for moderate pain. 03/13/17  Yes Arrien, Jimmy Picket, MD  Umeclidinium-Vilanterol [facility] ELLIPTA) 62.5-25 MCG/INH AEPB Inhale 1 puff into the lungs daily.   Yes [provider]  warfarin (COUMADIN) 2 MG tablet Take 1-2 mg by mouth See admin instructions. 1mg  by mouth once daily in the evening on Tuesday and Thursday. 2mg  by mouth once daily in the evening on Monday, Wednesday, Friday, Saturday, Sunday 12/15/16  Yes [provider]    Social History   Socioeconomic History  . Marital status: Widowed    Spouse name: Not on file  . Number of children: 3  . Years of education: Not on file  . Highest education level: Not on file  Social Needs  . Financial resource strain: Not on file  . Food insecurity - worry: Not on file  . Food insecurity - inability: Not on file  . Transportation needs - medical: Not on file  . Transportation needs - non-medical: Not on file  Occupational History  . Occupation: Press photographer  Tobacco Use  . Smoking status: Current Every Day Smoker    Packs/day: 1.50    Years: 75.00    Pack years: 112.50    Types: Cigarettes  . Smokeless tobacco: Never Used  Substance and Sexual Activity  . Alcohol use: No    Alcohol/week: 1.2 oz    Types: 2  Cans of beer per week  . Drug use: No  . Sexual activity: No  Other Topics Concern  . Not on file  Social History Narrative  . Not on file     Family History  Problem Relation Age of Onset  . Heart disease Mother   . Cancer Brother        colon  . Stroke Father     ROS: Otherwise negative unless mentioned in HPI  Physical Examination  Vitals:   04/22/17 0551 04/22/17 0902  BP: (!) 147/52 124/61  Pulse: 84 76  Resp: 18   Temp: 98.4 F (36.9 C)   SpO2: 95%    Body mass index is 22.08 kg/m.  General:  WDWN in NAD Gait: Not observed HENT: WNL, normocephalic Pulmonary: normal non-labored breathing, without Rales, rhonchi,  wheezing Cardiac: irregular Abdomen:  soft, NT/ND, no masses Skin: without rashes Vascular Exam/Pulses: palpable radials Extremities: without ischemic changes, without Gangrene , without cellulitis; without open wounds; R AC fossa with ecchymosis; able to palpate R cephalic vein above AC fossa Musculoskeletal: no muscle wasting or atrophy  Neurologic: A&O X 3;  No focal weakness or paresthesias are detected; speech is fluent/normal Psychiatric:  The pt has Normal affect. Lymph:  Unremarkable  CBC    Component Value Date/Time   WBC 7.9 04/22/2017 0654   RBC 3.01 (L) 04/22/2017 0654   HGB 9.1 (L) 04/22/2017 0654   HGB 13.4 02/12/2010 1214   HCT 28.2 (L) 04/22/2017 0654   HCT 39.1 02/12/2010 1214   PLT 114 (L) 04/22/2017 0654   PLT 114 (L) 02/12/2010 1214   MCV 93.7 04/22/2017 0654   MCV 95.8 02/12/2010 1214   MCH 30.2 04/22/2017 0654   MCHC 32.3 04/22/2017 0654   RDW 17.3 (H) 04/22/2017 0654   RDW 14.2 02/12/2010 1214   LYMPHSABS 0.9 03/10/2017 0853   LYMPHSABS 1.7 02/12/2010 1214   MONOABS 0.7 03/10/2017 0853   MONOABS 0.7 02/12/2010 1214   EOSABS 0.2 03/10/2017 0853   EOSABS 0.2 02/12/2010 1214   BASOSABS 0.0 03/10/2017 0853   BASOSABS 0.0 02/12/2010 1214    BMET    Component Value Date/Time  NA 136 04/22/2017 0654   K 3.2  (L) 04/22/2017 0654   CL 98 (L) 04/22/2017 0654   CO2 27 04/22/2017 0654   GLUCOSE 127 (H) 04/22/2017 0654   BUN 13 04/22/2017 0654   CREATININE 2.74 (H) 04/22/2017 0654   CREATININE 1.50 (H) 10/18/2015 1140   CALCIUM 8.4 (L) 04/22/2017 0654   GFRNONAA 19 (L) 04/22/2017 0654   GFRAA 21 (L) 04/22/2017 0654    COAGS: Lab Results  Component Value Date   INR 1.46 04/16/2017   INR 2.12 04/15/2017   INR 2.62 04/14/2017     Non-Invasive Vascular Imaging:   Right  Upper Extremity Vein Map  Cephalic - images 1-5 in [place] - mislabeled left.  Segment Diameter Depth Comment  1. Axilla 3.94mm 7.36mm   2. Mid upper arm 4.84mm 3.21mm   3. Above AC 4.64mm 3.66mm   4. In [facility] 4.66mm 3.62mm   5. Below AC 3.20mm 4.38mm   6. Mid forearm 3.51mm 3.59mm Tape in place  7. Wrist 1.67mm mm    mm mm              Basilic  Segment Diameter Depth Comment  1. Axilla 6.53mm 8.13mm   2. Mid upper arm 4.68mm 13.68mm   3. Above AC 2.45mm 3.30mm   4. In AC 1.62mm mm   5. Below AC mm mm Not seen   6. Mid forearm mm mm Not seen  7. Wrist mm mm    mm mm    mm mm    mm mm    Left  Upper Extremity Vein Map  Cephalic -   Segment Diameter Depth Comment  1. Axilla mm mm Not seen  2. Mid upper arm mm mm Not seen  3. Above AC mm mm Not seen  4. In AC 1.45mm mm   5. Below AC 2.58mm mm   6. Mid forearm 1.50mm mm   7. Wrist mm mm Not seen                  Basilic  Segment Diameter Depth Comment  1. Axilla 3.34mm 13.14mm.   2. Mid upper arm 4.67mm 12.46mm   3. Above [facility] 3.51mm 12.33mm   4. In [facility] 3.3mm 10.72mm   5. Below AC 2.6 9.66mm   6. Mid forearm 2.3mm 9.49mm   7. Wrist mm mm                      Statin:  No. Beta Blocker:  Yes.   Aspirin:  No. ACEI:  Yes.   ARB:  No. CCB use:  No Other antiplatelets/anticoagulants:  No.    ASSESSMENT/PLAN: This is a 84 y.o. male now with ESRD; Nephrology requesting permanent access  Patient  anxiously anticipating discharge from hospital tomorrow 04/23/17 He would prefer outpatient dialysis access surgery Unsure if R cephalic still usable given recent venous puncture and infiltration Not an ideal candidate for L arm basilic vein transposition This case will be discussed with Dr. Trula Slade who will evaluate the patient later today Continue HD from L IJ [facility] for now   Ronald Ligas PA-C [facility] [phone]   I agree with the above.  I have seen and examined the patient.  His best option would be a right BCF.  Patient wants to go home.  This can be done as an outpatient or we can try to arrange for the end of the week.  Ronald [name]

## 2017-04-22 NOTE — Progress Notes (Signed)
Subjective: Interval History: wants to cont HD, and knows needs perm access Objective: Vital signs in last 24 hours: Temp:  [97.4 F (36.3 C)-98.4 F (36.9 C)] 98.4 F (36.9 C) (02/26 0551) Pulse Rate:  [70-87] 76 (02/26 0902) Resp:  [18-20] 18 (02/26 0551) BP: (111-147)/(50-98) 124/61 (02/26 0902) SpO2:  [94 %-99 %] 95 % (02/26 0551) Weight:  [71.8 kg (158 lb 4.6 oz)-73 kg (160 lb 15 oz)] 71.8 kg (158 lb 4.6 oz) (02/26 0329) Weight change: 0 kg (0 lb)  Intake/Output from previous day: 02/25 0701 - 02/26 0700 In: -  Out: 2000  Intake/Output this shift: No intake/output data recorded.  Resp: diminished breath sounds bilaterally and rhonchi bibasilar Chest wall: L IJ cath PC Cardio: S1, S2 normal and systolic murmur: systolic ejection 2/6, decrescendo at 2nd left intercostal space GI: soft, ,liver down 5 cm, nontender Extremities: edema 2+  Lab Results: Recent Labs    04/21/17 0746 04/22/17 0654  WBC 8.2 7.9  HGB 8.6* 9.1*  HCT 26.8* 28.2*  PLT 104* 114*   BMET:  Recent Labs    04/21/17 0703 04/22/17 0654  NA 134* 136  K 4.6 3.2*  CL 99* 98*  CO2 23 27  GLUCOSE 134* 127*  BUN 28* 13  CREATININE 3.69* 2.74*  CALCIUM 8.4* 8.4*   No results for input(s): PTH in the last 72 hours. Iron Studies: No results for input(s): IRON, TIBC, TRANSFERRIN, FERRITIN in the last 72 hours.  Studies/Results: No results found.  I have reviewed the patient's current medications.  Assessment/Plan: 1 ESRD Hd tomorrow.  Needs perm access .vol xs 2 Anemia esa/Fe 3 AVR 4 COPD 5 HPTH pending P Hd, access , VVS,  Lower vol.     LOS: 12 days   Jeneen Rinks Parissa Chiao 04/22/2017,10:24 AM

## 2017-04-22 NOTE — Progress Notes (Signed)
CSW contacted Devonia at [facility] to make sure they can accept patient back on dialysis. They are able to transport patient to dialysis as long as it is located in [place]. Patient will be able to have increased help at the ALF. CSW continuing to follow.  Percell Locus Abeer Deskins LCSW [phone]

## 2017-04-22 NOTE — Progress Notes (Signed)
   Patient has adequate right arm cephalic or left arm basilic vein for fistula but may be best served with catheter long term. If permanent access needed please call.   Kennette Cuthrell C. Donzetta Matters, MD [facility] [place] Office: [phone] Pager: [phone]

## 2017-04-22 NOTE — Progress Notes (Signed)
Physical Therapy Treatment Patient Details Name: Ronald [name] MRN: [id number] DOB: 10/13/1922 Today's Date: 04/22/2017    History of Present Illness Pt is a 84 y.o. male who comes in with increasing shortness of breath, Hx significant for peripheral vascular disease status post vent placement, renal artery stenosis that is post angioplasty with stent and rotational atherectomy in 0762, diastolic heart failure (last echo 03/12/2017), stage III sent chronic kidney disease, hypertension, paroxysmal atrial fibrillation on warfarin, COPD, aortic stenosis status post bovine AV valve replacement in 01/07/2011, hyperlipidemia.    PT Comments    Pt very distracted by itching while in bed. Lotion applied to chest, upper arms, and back and then pt agreeable to getting OOB. Pt with dizziness in sitting and standing that did not completely subside after 3 mins in chair but was decreasing. Took pivot steps to chair with +2 mod A but did not feel safe ambulating with dizziness. Pt fatigues very quickly as well. PT will continue to follow.    Follow Up Recommendations  Home health PT;Other (comment)(ALF)     Equipment Recommendations  Rolling walker with 5" wheels;3in1 (PT)    Recommendations for Other Services       Precautions / Restrictions Precautions Precautions: Fall Precaution Comments: watch for lightheadedness Restrictions Weight Bearing Restrictions: No    Mobility  Bed Mobility Overal bed mobility: Needs Assistance Bed Mobility: Supine to Sit     Supine to sit: Mod assist     General bed mobility comments: pt able to get LE's off bed but needed mod A at trunk to come to sitting. Pt dizzy with sitting  Transfers Overall transfer level: Needs assistance Equipment used: 2 person hand held assist Transfers: Sit to/from [facility] Sit to Stand: Mod assist;+2 physical assistance Stand pivot transfers: Mod assist;+2 physical assistance       General transfer  comment: mod A +2 for power up. Used HHA due to pt dizziness. Pt took steps to L to chair, he did not feel he could ambulate further due to dizziness. Had difficulty taking bkwd steps.   Ambulation/Gait                 Stairs            Wheelchair Mobility    Modified Rankin (Stroke Patients Only)       Balance Overall balance assessment: Needs assistance Sitting-balance support: Feet supported;No upper extremity supported Sitting balance-Leahy Scale: Fair     Standing balance support: Bilateral upper extremity supported Standing balance-Leahy Scale: Poor Standing balance comment: requires UE support                            Cognition Arousal/Alertness: Awake/alert Behavior During Therapy: Anxious Overall Cognitive Status: No family/caregiver present to determine baseline cognitive functioning                                 General Comments: pt gets agitated very easily      Exercises      General Comments General comments (skin integrity, edema, etc.): pt's itch lotion applied to chest, upper arms, and back, gave some relief from itching      Pertinent Vitals/Pain Pain Assessment: No/denies pain    Home Living                      Prior Function  PT Goals (current goals can now be found in the care plan section) Acute Rehab PT Goals Patient Stated Goal: wants to get home PT Goal Formulation: With patient Time For Goal Achievement: 04/25/17 Potential to Achieve Goals: Good Progress towards PT goals: Progressing toward goals    Frequency    Min 3X/week      PT Plan Current plan remains appropriate    Co-evaluation              AM-PAC PT "6 Clicks" Daily Activity  Outcome Measure  Difficulty turning over in bed (including adjusting bedclothes, sheets and blankets)?: Unable Difficulty moving from lying on back to sitting on the side of the bed? : Unable Difficulty sitting down on  and standing up from a chair with arms (e.g., wheelchair, bedside commode, etc,.)?: Unable Help needed moving to and from a bed to chair (including a wheelchair)?: A Lot Help needed walking in hospital room?: A Lot Help needed climbing 3-5 steps with a railing? : Total 6 Click Score: 8    End of Session   Activity Tolerance: Patient limited by fatigue Patient left: in chair;with call bell/phone within reach;with chair alarm set Nurse Communication: Mobility status PT Visit Diagnosis: Muscle weakness (generalized) (M62.81);Difficulty in walking, not elsewhere classified (R26.2)     Time: 3291-9166 PT Time Calculation (min) (ACUTE ONLY): 16 min  Charges:  $Therapeutic Activity: 8-22 mins                    G Codes:       [place] 04/22/2017, 1:54 PM

## 2017-04-22 NOTE — Progress Notes (Signed)
PROGRESS NOTE  Ronald [name] IOX:[id number] DOB: 1922/04/25 DOA: 04/10/2017 PCP: [name], MD  Brief Narrative: 89yom presented with SOB, tarry stools. Admitted for acute on chronic diastolic CHF, acute GIB, AKI, elevated INR on warfarin. Seen by GI but Hgb remained stable. Warfarin stopped. GI recommended no intervention and signed off. Seen by nephrology with initial rec for conservative care, no HD. Subsequently patient opted for HD and was transferred to [facility] 2/20. HD started 2/21  Assessment/Plan Acute renal failure, progressive, suspected GN.  -Continue dialysis per nephrology -Condition at home when outpatient plan determined by nephrology.   Acute on chronic diastolic CHF, did not respond well to diuretics.   -Remains stable.  Volume management per dialysis. -Continue Toprol-XL  Melena with high INR on admission -Hemoglobin remains stable, GI recommended conservative care. -Continue Protonix   ABLA superimposed on anemia of chronic disease - Hgb stable  Thrombocytopenia -Chronic in nature.  Stable.  COPD -Remains stable.  Continue nebs, Anoro ellipta  Chronic atrial fibrillation -Remains stable, continue Toprol-XL - Warfarin discontinued in case further procedures planned  DM type 2 on insulin  -Remains stable.  Continue sliding scale insulin.  Bioprosthetic AVR   DVT prophylaxis: SCDs Code Status: DNR Family Communication:   Disposition Plan: return to ALF with HHPT   Murray Hodgkins, MD  [facility] Direct contact: [phone] --Via amion app OR  --[email or web address]; password [email or web address]  7PM-7AM contact night coverage as above 04/22/2017, 4:08 PM  LOS: 12 days   Consultants:  GI  Nephrology  PMT  Procedures: Left IJ tunneled HD catheter (28 cm Palindrome) with catheter tip in the mid RA  Antimicrobials:    Interval history/Subjective: Breathing better.  Objective: Vitals:  Vitals:   04/22/17 0902 04/22/17 1500  BP: 124/61 (!) 135/59    Pulse: 76 89  Resp:  18  Temp:  97.6 F (36.4 C)  SpO2:  95%    Exam:  Constitutional:   . Appears calm and comfortable Respiratory:  . CTA bilaterally, no w/r/r.  . Respiratory effort normal Cardiovascular:  . RRR, no m/r/g Psychiatric:  . Mental status o Mood, affect appropriate  I have personally reviewed the following:   Labs:  Blood sugar stable.  Potassium 3.2, remainder basic metabolic panel unremarkable.  Hemoglobin stable 9.1, platelets stable, 114.  Scheduled Meds: . buPROPion  150 mg Oral Daily  . darbepoetin (ARANESP) injection - DIALYSIS  100 mcg Intravenous Q Mon-HD  . [START ON 04/23/2017] doxazosin  2 mg Oral QHS  . finasteride  5 mg Oral QHS  . fluticasone  1 spray Each Nare BID  . insulin aspart  0-5 Units Subcutaneous QHS  . insulin aspart  0-9 Units Subcutaneous TID WC  . loratadine  10 mg Oral Daily  . Melatonin  4.5 mg Oral QHS  . metoprolol succinate  12.5 mg Oral BID WC  . multivitamin  1 tablet Oral QHS  . oxymetazoline  2 spray Right Nare UD  . pantoprazole  40 mg Oral Daily  . simvastatin  20 mg Oral QPM  . sodium chloride flush  3 mL Intravenous Q12H  . umeclidinium-vilanterol  1 puff Inhalation Daily   Continuous Infusions: . ferric gluconate (FERRLECIT/NULECIT) IV Stopped (04/21/17 1321)    Principal Problem:   Acute renal failure superimposed on stage 3 chronic kidney disease (HCC) Active Problems:   Essential hypertension   PVD, Rt SFA PTA/HSRA 06/17/11   AS (aortic stenosis)- s/p tissue AVR 01/07/11   Thrombocytopenia ([place])  Chronic atrial fibrillation (HCC)   COPD (chronic obstructive pulmonary disease) (HCC)   Acute on chronic diastolic (congestive) heart failure (HCC)   Diabetes mellitus type 2, uncontrolled (HCC)   Acute blood loss anemia   GI bleed   Goals of care, counseling/discussion   DNR (do not resuscitate) discussion   Palliative care encounter   LOS: 12 days

## 2017-04-22 NOTE — Consult Note (Addendum)
Hospital Consult    Reason for Consult:  Need for permanent dialysis Requesting Physician:  Dr. Jimmy Footman MRN #:  [id number]  History of Present Illness: This is a 84 y.o. male with medical history significant for CHF, worsening renal function requiring dialysis, thrombocytopenia, atrial fibrillation, IDDM who was admitted for SOB and acute GI bleed.  Coumadin has been discontinued per GI.  Per Nephrology patient need long term hemodialysis.  Vein mapping has been performed which demonstrates adequate R cephalic and L basilic vein.  On exam, patient is noted to have local ecchymosis and bandaging to R AC fossa from venous puncture.  He is currently dialyzing from L IJ PermCath on MWF schedule.  Patient is anxiously anticipating discharge tomorrow 04/23/17.  Past Medical History:  Diagnosis Date  . AS (aortic stenosis)    bovine aortic valve replacement 01/07/11  . Bladder cancer [facility])    Bladder Cancer local  . Blood transfusion    w/hip operation  . Cellulitis of left lower extremity   . Chronic atrial fibrillation ([place])   . Chronic diastolic congestive heart failure ([place])   . Claudication in peripheral vascular disease ([place]) 06/17/2011  . COPD (chronic obstructive pulmonary disease) ([place])   . Depression wife died 4 years ago.    Marland Kitchen History of stomach ulcers ~ 1951  . HTN (hypertension)   . Neuropathy due to secondary diabetes ([place])   . Peripheral vascular disease ([place]) very poor circulation legs and feet ... stents right and left legs... done in dr j. Gwenlyn Found 's office.   . Pneumonia 07/25/11   left  . Recurrent upper respiratory infection (URI)    sinusitis  . Renal artery stenosis ([place]) 2006   renal artery stent  . S/P angioplasty with stent, diamond back rotational athrectomy Prox. Rt. SFA 06/17/2011 06/17/2011  . Shortness of breath   . Thrombocytopenia due to drugs    seen by Dr Inda Merlin plts [id number] no rx  . Type II diabetes mellitus ([place])     Past Surgical History:    Procedure Laterality Date  . ABDOMINAL ANGIOGRAM  06/17/2011   Procedure: ABDOMINAL ANGIOGRAM;  Surgeon: Lorretta Harp, MD;  Location: [facility] CATH LAB;  Service: Cardiovascular;;  . AORTIC VALVE REPLACEMENT  01/07/2011   Procedure: AORTIC VALVE REPLACEMENT (AVR);  Surgeon: Grace Isaac, MD;  Location: [place];  Service: Open Heart Surgery;  Laterality: N/A;; magna-ease bovine 54mm bioprosthesis  . ATHERECTOMY N/A 06/17/2011   Procedure: ATHERECTOMY;  Surgeon: Lorretta Harp, MD;  Location: [facility] CATH LAB;  Service: Cardiovascular;  Laterality: N/A;  . CARDIAC CATHETERIZATION  11/19/10   normal coronaries, mod AS, 75% l RAS  . CARDIOVERSION  04/03/2011   Procedure: CARDIOVERSION;  Surgeon: Leonie Man, MD;  Location: [place];  Service: Cardiovascular;  Laterality: N/A;  . CATARACT EXTRACTION W/ INTRAOCULAR LENS  IMPLANT, BILATERAL  ~ 2007  . CHOLECYSTECTOMY    . FEMUR IM NAIL  06/27/2011   Procedure: INTRAMEDULLARY (IM) NAIL FEMORAL;  Surgeon: Marin Shutter, MD;  Location: [facility];  Service: Orthopedics;  Laterality: Right;  . FEMUR IM NAIL Left 12/14/2013   Procedure: INTRAMEDULLARY (IM) NAIL FEMORAL;  Surgeon: Mauri Pole, MD;  Location: [facility];  Service: Orthopedics;  Laterality: Left;  . FRACTURE SURGERY    . IR FLUORO GUIDE CV LINE LEFT  04/17/2017  . IR US GUIDE VASC ACCESS LEFT  04/17/2017  . LOWER EXTREMITY ANGIOGRAM  06/17/2011   diamondback orbital rotational and cutting balloon  atherectomy of the prox R SFA  . LOWER EXTREMITY ANGIOGRAM Bilateral 06/17/2011   Procedure: LOWER EXTREMITY ANGIOGRAM;  Surgeon: Lorretta Harp, MD;  Location[facility] CATH LAB;  Service: Cardiovascular;  Laterality: Bilateral;  . PERIPHERAL ARTERIAL STENT GRAFT     2006 left anf right illiac stents Dr Deon Pilling  . RENAL ARTERY STENT  2006   "I believe"  . REVERSE SHOULDER ARTHROPLASTY Left 12/31/2016   Procedure: LEFT REVERSE SHOULDER ARTHROPLASTY;  Surgeon: Nicholes Stairs, MD;  Location: [place];  Service:  Orthopedics;  Laterality: Left;  . TONSILLECTOMY AND ADENOIDECTOMY     "when I was a kid"    Allergies  Allergen Reactions  . Aspirin Anaphylaxis, Shortness Of Breath and Rash    "broke out in white welts; red blotches neck and face; windpipe closing; ~ 1962"    Prior to Admission medications   Medication Sig Start Date End Date Taking? Authorizing Provider  acetaminophen (TYLENOL) 325 MG tablet Take 650 mg by mouth every 6 (six) hours as needed for mild pain.   Yes [provider]  albuterol (ACCUNEB) 0.63 MG/3ML nebulizer solution Take 3 mLs by nebulization every 6 (six) hours as needed for wheezing or shortness of breath.   Yes [provider]  alendronate (FOSAMAX) 70 MG tablet Take 70 mg by mouth every 14 (fourteen) days. Take every other sunday 12/14/16  Yes [provider]  benazepril (LOTENSIN) 20 MG tablet Take 20 mg by mouth daily.   Yes [provider]  buPROPion (WELLBUTRIN XL) 150 MG 24 hr tablet Take 150 mg by mouth daily.    Yes [provider]  doxazosin (CARDURA) 4 MG tablet Take 4 mg by mouth daily.    Yes Collins, Gina L, PA-C  ferrous sulfate 325 (65 FE) MG EC tablet Take 1 tablet (325 mg total) by mouth 2 (two) times daily. 07/04/16 07/04/17 Yes Lavina Hamman, MD  finasteride (PROSCAR) 5 MG tablet Take 5 mg by mouth at bedtime.    Yes [provider]  fluticasone (FLONASE) 50 MCG/ACT nasal spray Place 1 spray into both nostrils 2 (two) times daily.    Yes [provider]  furosemide (LASIX) 40 MG tablet Take 1 tablet (40 mg total) by mouth daily. 07/04/16  Yes Lavina Hamman, MD  glyBURIDE (DIABETA) 5 MG tablet Take 2.5-10 mg by mouth See admin instructions. 10mg  by mouth in the morning. 2.5mg  in the evening   Yes [provider]  Insulin Glargine (LANTUS) 100 UNIT/ML Solostar Pen Inject 10 Units into the skin daily.    Yes [provider]  loratadine (CLARITIN) 10 MG tablet Take 10 mg by  mouth daily.   Yes [provider]  Melatonin 5 MG TABS Take 5 mg by mouth at bedtime.   Yes [provider]  metoprolol succinate (TOPROL-XL) 50 MG 24 hr tablet Take 50 mg by mouth 2 (two) times daily. Take with or immediately following a meal.   Yes [provider]  polyethylene glycol (MIRALAX / GLYCOLAX) packet Take 17 g by mouth daily. 12/25/16  Yes Mariel Aloe, MD  potassium chloride SA (K-DUR,KLOR-CON) 20 MEQ tablet Take 20 mEq by mouth daily.    Yes [provider]  simvastatin (ZOCOR) 20 MG tablet Take 20 mg by mouth every evening.   Yes [provider]  traMADol (ULTRAM) 50 MG tablet Take 1 tablet (50 mg total) by mouth every 12 (twelve) hours as needed for moderate pain. 03/13/17  Yes Arrien, Jimmy Picket, MD  Umeclidinium-Vilanterol [facility] ELLIPTA) 62.5-25 MCG/INH AEPB Inhale 1 puff into the lungs daily.   Yes [provider]  warfarin (COUMADIN) 2 MG tablet Take 1-2 mg by mouth See admin instructions. 1mg  by mouth once daily in the evening on Tuesday and Thursday. 2mg  by mouth once daily in the evening on Monday, Wednesday, Friday, Saturday, Sunday 12/15/16  Yes [provider]    Social History   Socioeconomic History  . Marital status: Widowed    Spouse name: Not on file  . Number of children: 3  . Years of education: Not on file  . Highest education level: Not on file  Social Needs  . Financial resource strain: Not on file  . Food insecurity - worry: Not on file  . Food insecurity - inability: Not on file  . Transportation needs - medical: Not on file  . Transportation needs - non-medical: Not on file  Occupational History  . Occupation: Press photographer  Tobacco Use  . Smoking status: Current Every Day Smoker    Packs/day: 1.50    Years: 75.00    Pack years: 112.50    Types: Cigarettes  . Smokeless tobacco: Never Used  Substance and Sexual Activity  . Alcohol use: No    Alcohol/week: 1.2 oz    Types: 2  Cans of beer per week  . Drug use: No  . Sexual activity: No  Other Topics Concern  . Not on file  Social History Narrative  . Not on file     Family History  Problem Relation Age of Onset  . Heart disease Mother   . Cancer Brother        colon  . Stroke Father     ROS: Otherwise negative unless mentioned in HPI  Physical Examination  Vitals:   04/22/17 0551 04/22/17 0902  BP: (!) 147/52 124/61  Pulse: 84 76  Resp: 18   Temp: 98.4 F (36.9 C)   SpO2: 95%    Body mass index is 22.08 kg/m.  General:  WDWN in NAD Gait: Not observed HENT: WNL, normocephalic Pulmonary: normal non-labored breathing, without Rales, rhonchi,  wheezing Cardiac: irregular Abdomen:  soft, NT/ND, no masses Skin: without rashes Vascular Exam/Pulses: palpable radials Extremities: without ischemic changes, without Gangrene , without cellulitis; without open wounds; R AC fossa with ecchymosis; able to palpate R cephalic vein above AC fossa Musculoskeletal: no muscle wasting or atrophy  Neurologic: A&O X 3;  No focal weakness or paresthesias are detected; speech is fluent/normal Psychiatric:  The pt has Normal affect. Lymph:  Unremarkable  CBC    Component Value Date/Time   WBC 7.9 04/22/2017 0654   RBC 3.01 (L) 04/22/2017 0654   HGB 9.1 (L) 04/22/2017 0654   HGB 13.4 02/12/2010 1214   HCT 28.2 (L) 04/22/2017 0654   HCT 39.1 02/12/2010 1214   PLT 114 (L) 04/22/2017 0654   PLT 114 (L) 02/12/2010 1214   MCV 93.7 04/22/2017 0654   MCV 95.8 02/12/2010 1214   MCH 30.2 04/22/2017 0654   MCHC 32.3 04/22/2017 0654   RDW 17.3 (H) 04/22/2017 0654   RDW 14.2 02/12/2010 1214   LYMPHSABS 0.9 03/10/2017 0853   LYMPHSABS 1.7 02/12/2010 1214   MONOABS 0.7 03/10/2017 0853   MONOABS 0.7 02/12/2010 1214   EOSABS 0.2 03/10/2017 0853   EOSABS 0.2 02/12/2010 1214   BASOSABS 0.0 03/10/2017 0853   BASOSABS 0.0 02/12/2010 1214    BMET    Component Value Date/Time  NA 136 04/22/2017 0654   K 3.2  (L) 04/22/2017 0654   CL 98 (L) 04/22/2017 0654   CO2 27 04/22/2017 0654   GLUCOSE 127 (H) 04/22/2017 0654   BUN 13 04/22/2017 0654   CREATININE 2.74 (H) 04/22/2017 0654   CREATININE 1.50 (H) 10/18/2015 1140   CALCIUM 8.4 (L) 04/22/2017 0654   GFRNONAA 19 (L) 04/22/2017 0654   GFRAA 21 (L) 04/22/2017 0654    COAGS: Lab Results  Component Value Date   INR 1.46 04/16/2017   INR 2.12 04/15/2017   INR 2.62 04/14/2017     Non-Invasive Vascular Imaging:   Right  Upper Extremity Vein Map  Cephalic - images 1-5 in [place] - mislabeled left.  Segment Diameter Depth Comment  1. Axilla 3.34mm 7.12mm   2. Mid upper arm 4.32mm 3.47mm   3. Above AC 4.73mm 3.36mm   4. In [facility] 4.13mm 3.47mm   5. Below AC 3.27mm 4.68mm   6. Mid forearm 3.23mm 3.97mm Tape in place  7. Wrist 1.53mm mm    mm mm              Basilic  Segment Diameter Depth Comment  1. Axilla 6.12mm 8.19mm   2. Mid upper arm 4.64mm 13.59mm   3. Above AC 2.3mm 3.76mm   4. In AC 1.52mm mm   5. Below AC mm mm Not seen   6. Mid forearm mm mm Not seen  7. Wrist mm mm    mm mm    mm mm    mm mm    Left  Upper Extremity Vein Map  Cephalic -   Segment Diameter Depth Comment  1. Axilla mm mm Not seen  2. Mid upper arm mm mm Not seen  3. Above AC mm mm Not seen  4. In AC 1.2mm mm   5. Below AC 2.11mm mm   6. Mid forearm 1.27mm mm   7. Wrist mm mm Not seen                  Basilic  Segment Diameter Depth Comment  1. Axilla 3.73mm 13.40mm.   2. Mid upper arm 4.33mm 12.25mm   3. Above [facility] 3.44mm 12.76mm   4. In [facility] 3.20mm 10.65mm   5. Below AC 2.6 9.25mm   6. Mid forearm 2.63mm 9.60mm   7. Wrist mm mm                      Statin:  No. Beta Blocker:  Yes.   Aspirin:  No. ACEI:  Yes.   ARB:  No. CCB use:  No Other antiplatelets/anticoagulants:  No.    ASSESSMENT/PLAN: This is a 84 y.o. male now with ESRD; Nephrology requesting permanent access  Patient  anxiously anticipating discharge from hospital tomorrow 04/23/17 He would prefer outpatient dialysis access surgery Unsure if R cephalic still usable given recent venous puncture and infiltration Not an ideal candidate for L arm basilic vein transposition This case will be discussed with Dr. Trula Slade who will evaluate the patient later today Continue HD from L IJ [facility] for now   Dagoberto Ligas PA-C [facility] [phone]   I agree with the above.  I have seen and examined the patient.  His best option would be a right BCF.  Patient wants to go home.  This can be done as an outpatient or we can try to arrange for the end of the week.  Annamarie Major

## 2017-04-23 DIAGNOSIS — N179 Acute kidney failure, unspecified: Secondary | ICD-10-CM

## 2017-04-23 DIAGNOSIS — I509 Heart failure, unspecified: Secondary | ICD-10-CM

## 2017-04-23 DIAGNOSIS — I5033 Acute on chronic diastolic (congestive) heart failure: Secondary | ICD-10-CM

## 2017-04-23 DIAGNOSIS — D62 Acute posthemorrhagic anemia: Secondary | ICD-10-CM

## 2017-04-23 DIAGNOSIS — N183 Chronic kidney disease, stage 3 (moderate): Secondary | ICD-10-CM

## 2017-04-23 LAB — GLUCOSE, CAPILLARY
Glucose-Capillary: 136 mg/dL — ABNORMAL HIGH (ref 65–99)
Glucose-Capillary: 115 mg/dL — ABNORMAL HIGH (ref 65–99)
Glucose-Capillary: 178 mg/dL — ABNORMAL HIGH (ref 65–99)

## 2017-04-23 LAB — RENAL FUNCTION PANEL
Albumin: 2.5 g/dL — ABNORMAL LOW (ref 3.5–5.0)
Anion gap: 11 (ref 5–15)
BUN: 28 mg/dL — AB (ref 6–20)
CHLORIDE: 98 mmol/L — AB (ref 101–111)
CO2: 27 mmol/L (ref 22–32)
Calcium: 8.7 mg/dL — ABNORMAL LOW (ref 8.9–10.3)
Creatinine, Ser: 4.23 mg/dL — ABNORMAL HIGH (ref 0.61–1.24)
GFR calc Af Amer: 13 mL/min — ABNORMAL LOW (ref >60)
GFR, EST NON AFRICAN AMERICAN: 11 mL/min — AB (ref >60)
Glucose, Bld: 136 mg/dL — ABNORMAL HIGH (ref 65–99)
POTASSIUM: 3.2 mmol/L — AB (ref 3.5–5.1)
Phosphorus: 3.9 mg/dL (ref 2.5–4.6)
Sodium: 136 mmol/L (ref 135–145)

## 2017-04-23 LAB — PARATHYROID HORMONE, INTACT (NO CA): PTH: 84 pg/mL — ABNORMAL HIGH (ref 15–65)

## 2017-04-23 LAB — CBC
HEMATOCRIT: 27.5 % — AB (ref 39.0–52.0)
HEMOGLOBIN: 8.9 g/dL — AB (ref 13.0–17.0)
MCH: 30.6 pg (ref 26.0–34.0)
MCHC: 32.4 g/dL (ref 30.0–36.0)
MCV: 94.5 fL (ref 78.0–100.0)
Platelets: 118 10*3/uL — ABNORMAL LOW (ref 150–400)
RBC: 2.91 MIL/uL — AB (ref 4.22–5.81)
RDW: 17.5 % — AB (ref 11.5–15.5)
WBC: 7.6 10*3/uL (ref 4.0–10.5)

## 2017-04-23 MED ORDER — HEPARIN SODIUM (PORCINE) 1000 UNIT/ML DIALYSIS: 1000.0000 [IU] | INTRAMUSCULAR | Status: DC | PRN

## 2017-04-23 MED ORDER — HEPARIN SODIUM (PORCINE) 1000 UNIT/ML DIALYSIS: 100.0000 [IU]/kg | INTRAMUSCULAR | Status: DC | PRN

## 2017-04-23 MED ORDER — PENTAFLUOROPROP-TETRAFLUOROETH EX AERO: 1.0000 "application " | INHALATION_SPRAY | CUTANEOUS | Status: DC | PRN

## 2017-04-23 MED ORDER — SODIUM CHLORIDE 0.9 % IV SOLN: 100.0000 mL | INTRAVENOUS | Status: DC | PRN

## 2017-04-23 MED ORDER — LIDOCAINE HCL (PF) 1 % IJ SOLN: 5.0000 mL | INTRAMUSCULAR | Status: DC | PRN

## 2017-04-23 MED ORDER — ALTEPLASE 2 MG IJ SOLR: 2.0000 mg | Freq: Once | INTRAMUSCULAR | Status: DC | PRN

## 2017-04-23 MED ORDER — LIDOCAINE-PRILOCAINE 2.5-2.5 % EX CREA: 1.0000 "application " | TOPICAL_CREAM | CUTANEOUS | Status: DC | PRN

## 2017-04-23 NOTE — Progress Notes (Signed)
Subjective: Interval History: has no complaint, wants to go home but agrees to perm access prior to d/c.  Objective: Vital signs in last 24 hours: Temp:  [97.5 F (36.4 C)-98.3 F (36.8 C)] 97.5 F (36.4 C) (02/27 0855) Pulse Rate:  [61-89] 83 (02/27 0912) Resp:  [16-18] 17 (02/27 0912) BP: (135-152)/(48-69) 146/69 (02/27 0912) SpO2:  [94 %-99 %] 96 % (02/27 0855) Weight:  [72.2 kg (159 lb 2.8 oz)-72.8 kg (160 lb 8 oz)] 72.2 kg (159 lb 2.8 oz) (02/27 0855) Weight change: -2.398 kg (-4.6 oz)  Intake/Output from previous day: No intake/output data recorded. Intake/Output this shift: No intake/output data recorded.  General appearance: cooperative, no distress, pale and slowed mentation Resp: diminished breath sounds bilaterally Chest wall: IJ PC Cardio: irregularly irregular rhythm and systolic murmur: systolic ejection 2/6, decrescendo at 2nd left intercostal space GI: soft.Marland Kitchen liver down 4 cm , pos bs Extremities: extremities normal, atraumatic, no cyanosis or edema  Lab Results: Recent Labs    04/22/17 0654 04/23/17 0646  WBC 7.9 7.6  HGB 9.1* 8.9*  HCT 28.2* 27.5*  PLT 114* 118*   BMET:  Recent Labs    04/22/17 0654 04/23/17 0646  NA 136 136  K 3.2* 3.2*  CL 98* 98*  CO2 27 27  GLUCOSE 127* 136*  BUN 13 28*  CREATININE 2.74* 4.23*  CALCIUM 8.4* 8.7*   Recent Labs    04/21/17 1828  PTH 84*   Iron Studies: No results for input(s): IRON, TIBC, TRANSFERRIN, FERRITIN in the last 72 hours.  Studies/Results: No results found.  I have reviewed the patient's current medications.  Assessment/Plan: 1 ESRD needs perm access prior to d/c. For HD today 2 AVR 3 Anemia esa/Fe 4 HPTH stable 5 HOH  6 DM 7 COPD P Hd, esa, Perm access, CLIP   LOS: 13 days   Adit Riddles 04/23/2017,10:30 AM

## 2017-04-23 NOTE — Procedures (Signed)
I was present at this session.  I have reviewed the session itself and made appropriate changes.Hd via PC, flow 350,  bp low 100s. Counseled.  Jeneen Rinks Marlise Fahr 2/27/201910:34 AM

## 2017-04-23 NOTE — Progress Notes (Signed)
   Will plan left arm avf vs avg tomorrow in OR. I have discussed risks and benefits with family and they agree to proceed.  Brandon C. Donzetta Matters, MD [facility] [place] Office: [phone] Pager: [phone]

## 2017-04-23 NOTE — Progress Notes (Signed)
[facility]                                                                              Patient Demographics  Ronald [name], is a 84 y.o. male, DOB - 1922/07/02, XLK:[id number]  Admit date - 04/10/2017   Admitting Physician Cristy Folks, MD  Outpatient Primary MD for the patient is [name], MD  Outpatient specialists:   LOS - 13  days   Medical records reviewed and are as summarized below:    Chief Complaint  Patient presents with  . Shortness of Breath       Brief summary   89yom presented with SOB, tarry stools. Admitted for acute on chronic diastolic CHF, acute GIB, AKI, elevated INR on warfarin. Seen by GI but Hgb remained stable. Warfarin stopped. GI recommended no intervention and signed off. Seen by nephrology with initial rec for conservative care, no HD. Subsequently patient opted for HD and was transferred to [facility] 2/20. HD started 2/21      Assessment & Plan    Principal Problem:   Acute renal failure superimposed on stage 3 chronic kidney disease ([place]), now ESRD, started on hemodialysis -Started on HD on 2/21, discussed with nephrology, Dr. Jimmy Footman -Vascular surgery was consulted, for now continue HD with the [facility].  Per Dr. Deterdingg, patient will need permanent access prior to discharge and CLIP  Active Problems: Acute on chronic diastolic CHF -Volume management with hemodialysis -Continue Toprol-XL  GI bleed, coagulopathy on admission, high INR -Currently patient is stable, GI recommended conservative care -Continue Protonix  Acute blood loss anemia superimposed on anemia of chronic disease -H&H stable  Chronic atrial fibrillation -Rate controlled, continue Toprol-XL -Warfarin on hold in case further future procedures planned  Diabetes mellitus type 2 -Continue sliding scale insulin  Bioprosthetic AVR  COPD -Currently stable   Code Status:  DNR  DVT Prophylaxis:  SCD's Family Communication: Discussed in detail  with the patient, all imaging results, lab results explained to the patient   Disposition Plan: When cleared by renal  Time Spent in minutes 25 minutes  Procedures:  Hemodialysis Left IJ tunneled catheter  Consultants:   Nephrology Vascular surgery  Antimicrobials:   None   Medications  Scheduled Meds: . buPROPion  150 mg Oral Daily  . darbepoetin (ARANESP) injection - DIALYSIS  100 mcg Intravenous Q Mon-HD  . doxazosin  2 mg Oral QHS  . finasteride  5 mg Oral QHS  . fluticasone  1 spray Each Nare BID  . insulin aspart  0-5 Units Subcutaneous QHS  . insulin aspart  0-9 Units Subcutaneous TID WC  . loratadine  10 mg Oral Daily  . Melatonin  4.5 mg Oral QHS  . metoprolol succinate  12.5 mg Oral BID WC  . multivitamin  1 tablet Oral QHS  . oxymetazoline  2 spray Right Nare UD  . pantoprazole  40 mg Oral Daily  . simvastatin  20 mg Oral QPM  . sodium chloride flush  3 mL Intravenous Q12H  . umeclidinium-vilanterol  1 puff Inhalation Daily   Continuous Infusions: . ferric gluconate (FERRLECIT/NULECIT) IV Stopped (04/23/17 1339)  PRN Meds:.acetaminophen **OR** acetaminophen, albuterol, camphor-menthol, diphenhydrAMINE, ondansetron **OR** ondansetron (ZOFRAN) IV, polyethylene glycol, traMADol   Antibiotics   Anti-infectives (From admission, onward)   Start     Dose/Rate Route Frequency Ordered Stop   04/17/17 1200  ceFAZolin (ANCEF) IVPB 2g/100 mL premix     2 g 200 mL/hr over 30 Minutes Intravenous To Radiology 04/16/17 1723 04/18/17 1200        Subjective:   Ronald [name] was seen and examined today.  Feels better, no acute shortness of breath.  No fevers or chills. Patient denies dizziness, abdominal pain, N/V/D/C, new weakness, numbess, tingling. No acute events overnight.    Objective:   Vitals:   04/23/17 1230 04/23/17 1300 04/23/17 1307 04/23/17 1435  BP: (!) 114/59 (!) 130/52 (!) 116/51 (!) 134/47  Pulse: 88 84 85 76  Resp: '18 18 18 '$ (!) 22  Temp:    98.4 F (36.9 C) 98.1 F (36.7 C)  TempSrc:   Oral Oral  SpO2:   96% 99%  Weight:   69.7 kg (153 lb 10.6 oz)   Height:        Intake/Output Summary (Last 24 hours) at 04/23/2017 1503 Last data filed at 04/23/2017 1307 Gross per 24 hour  Intake -  Output 2500 ml  Net -2500 ml     Wt Readings from Last 3 Encounters:  04/23/17 69.7 kg (153 lb 10.6 oz)  03/13/17 77.6 kg (171 lb)  12/31/16 81.2 kg (179 lb)     Exam  General: Alert and oriented x 3, NAD, hard of hearing   eyes:   HEENT:    Cardiovascular: S1 S2 auscultated RRR  Respiratory: Clear to auscultation bilaterally, no wheezing, rales or rhonchi  Gastrointestinal: Soft, nontender, nondistended, + bowel sounds  Ext: no pedal edema bilaterally  Neuro no new deficit  Musculoskeletal: No digital cyanosis, clubbing  Skin: No rashes  Psych: Normal affect and demeanor, alert and oriented x3    Data Reviewed:  I have personally reviewed following labs and imaging studies  Micro Results Recent Results (from the past 240 hour(s))  Culture, Urine     Status: Abnormal   Collection Time: 04/15/17  2:01 AM  Result Value Ref Range Status   Specimen Description   Final    URINE, RANDOM Performed at [place] [address], [place], [place] [address]    Special Requests   Final    NONE Performed at [facility] [place] [address], [place], [place] [address]    Culture MULTIPLE SPECIES PRESENT, SUGGEST RECOLLECTION (A)  Final   Report Status 04/16/2017 FINAL  Final    Radiology Reports Dg Chest 2 View  Result Date: 04/10/2017 CLINICAL DATA:  Shortness of breath. EXAM: CHEST  2 VIEW COMPARISON:  Radiograph 03/10/2017 FINDINGS: Post median sternotomy with prosthetic aortic valve. Prior left atrial clipping. Unchanged cardiomegaly. Progressive pulmonary edema from prior exam. Increased bilateral pleural effusions. No pneumothorax. Left shoulder surgical hardware is partially  included. IMPRESSION: Progressive pulmonary edema and increased bilateral pleural effusions consistent with worsening CHF. Electronically Signed   By: Jeb Levering M.D.   On: 04/10/2017 03:21   US Renal  Result Date: 04/12/2017 CLINICAL DATA:  Acute kidney injury EXAM: RENAL / URINARY TRACT ULTRASOUND COMPLETE COMPARISON:  CT abdomen pelvis 06/29/2011 FINDINGS: Right Kidney: Length: 11.2 cm. Cortical thinning diffusely with increased echogenicity of the cortex. Right upper pole cyst 14 mm. Negative for hydronephrosis or mass. Left Kidney: Length: 11.2 cm. Mild increased  echogenicity of the cortex. Left upper pole cyst 2.4 cm. Negative for mass or hydronephrosis. Bladder: Appears normal for degree of bladder distention. IMPRESSION: Increased echogenicity of the renal cortex bilaterally. Cortical thinning noted in the right kidney. Bilateral renal cysts. Negative for hydronephrosis. Electronically Signed   By: Franchot Gallo M.D.   On: 04/12/2017 13:04   Ir Fluoro Guide Cv Line Left  Result Date: 04/17/2017 INDICATION: 84 year old male with acute CHF and acute exacerbation of chronic kidney disease now requiring hemodialysis. He presents for placement of a tunneled hemodialysis catheter. He has a vesicular rash overlying his right chest that appears to represent herpes zoster. Therefore, we will proceed with placement of a left-sided HD catheter. EXAM: TUNNELED CENTRAL VENOUS HEMODIALYSIS CATHETER PLACEMENT WITH ULTRASOUND AND FLUOROSCOPIC GUIDANCE MEDICATIONS: 2 g Ancef. The antibiotic was given in an appropriate time interval prior to skin puncture. ANESTHESIA/SEDATION: 25 mg mcg fentanyl administered intravenously for pain control. This does not constitute conscious sedation. FLUOROSCOPY TIME:  Fluoroscopy Time: 1 minutes 0 seconds (3 mGy). COMPLICATIONS: None immediate. PROCEDURE: Informed written consent was obtained from the patient after a discussion of the risks, benefits, and alternatives to  treatment. Questions regarding the procedure were encouraged and answered. The left neck and chest were prepped with chlorhexidine in a sterile fashion, and a sterile drape was applied covering the operative field. Maximum barrier sterile technique with sterile gowns and gloves were used for the procedure. A timeout was performed prior to the initiation of the procedure. After creating a small venotomy incision, a micropuncture kit was utilized to access the left internal jugular vein under direct, real-time ultrasound guidance after the overlying soft tissues were anesthetized with 1% lidocaine with epinephrine. Ultrasound image documentation was performed. The microwire was kinked to measure appropriate catheter length. A stiff Glidewire was advanced to the level of the IVC and the micropuncture sheath was exchanged for a peel-away sheath. A palindrome tunneled hemodialysis catheter measuring 28 cm from tip to cuff was tunneled in a retrograde fashion from the anterior chest wall to the venotomy incision. The catheter was then placed through the peel-away sheath with tips ultimately positioned within the superior aspect of the right atrium. Final catheter positioning was confirmed and documented with a spot radiographic image. The catheter aspirates and flushes normally. The catheter was flushed with appropriate volume heparin dwells. The catheter exit site was secured with a 0-Prolene retention suture. The venotomy incision was closed with Dermabond. Dressings were applied. The patient tolerated the procedure well without immediate post procedural complication. IMPRESSION: Successful placement of 28 cm tip to cuff tunneled hemodialysis catheter via the left internal jugular vein with tips terminating within the superior aspect of the right atrium. The catheter is ready for immediate use. Electronically Signed   By: Jacqulynn Cadet M.D.   On: 04/17/2017 10:42   Ir US Guide Vasc Access Left  Result Date:  04/17/2017 INDICATION: 84 year old male with acute CHF and acute exacerbation of chronic kidney disease now requiring hemodialysis. He presents for placement of a tunneled hemodialysis catheter. He has a vesicular rash overlying his right chest that appears to represent herpes zoster. Therefore, we will proceed with placement of a left-sided HD catheter. EXAM: TUNNELED CENTRAL VENOUS HEMODIALYSIS CATHETER PLACEMENT WITH ULTRASOUND AND FLUOROSCOPIC GUIDANCE MEDICATIONS: 2 g Ancef. The antibiotic was given in an appropriate time interval prior to skin puncture. ANESTHESIA/SEDATION: 25 mg mcg fentanyl administered intravenously for pain control. This does not constitute conscious sedation. FLUOROSCOPY TIME:  Fluoroscopy Time: 1 minutes  0 seconds (3 mGy). COMPLICATIONS: None immediate. PROCEDURE: Informed written consent was obtained from the patient after a discussion of the risks, benefits, and alternatives to treatment. Questions regarding the procedure were encouraged and answered. The left neck and chest were prepped with chlorhexidine in a sterile fashion, and a sterile drape was applied covering the operative field. Maximum barrier sterile technique with sterile gowns and gloves were used for the procedure. A timeout was performed prior to the initiation of the procedure. After creating a small venotomy incision, a micropuncture kit was utilized to access the left internal jugular vein under direct, real-time ultrasound guidance after the overlying soft tissues were anesthetized with 1% lidocaine with epinephrine. Ultrasound image documentation was performed. The microwire was kinked to measure appropriate catheter length. A stiff Glidewire was advanced to the level of the IVC and the micropuncture sheath was exchanged for a peel-away sheath. A palindrome tunneled hemodialysis catheter measuring 28 cm from tip to cuff was tunneled in a retrograde fashion from the anterior chest wall to the venotomy incision. The  catheter was then placed through the peel-away sheath with tips ultimately positioned within the superior aspect of the right atrium. Final catheter positioning was confirmed and documented with a spot radiographic image. The catheter aspirates and flushes normally. The catheter was flushed with appropriate volume heparin dwells. The catheter exit site was secured with a 0-Prolene retention suture. The venotomy incision was closed with Dermabond. Dressings were applied. The patient tolerated the procedure well without immediate post procedural complication. IMPRESSION: Successful placement of 28 cm tip to cuff tunneled hemodialysis catheter via the left internal jugular vein with tips terminating within the superior aspect of the right atrium. The catheter is ready for immediate use. Electronically Signed   By: Jacqulynn Cadet M.D.   On: 04/17/2017 10:42   Dg Chest Port 1 View  Result Date: 04/19/2017 CLINICAL DATA:  Pulmonary edema EXAM: PORTABLE CHEST 1 VIEW COMPARISON:  04/13/2017 FINDINGS: Left jugular dialysis catheter has been placed with its tip at the cavoatrial junction. Atrial appendage stable remains in place. Hazy hazy airspace disease compatible with pulmonary edema is stable on the left and worse in the right lung. Small right pleural effusion is not excluded. IMPRESSION: Bilateral hazy pulmonary edema is stable on the left and worsening on the right. Small right pleural effusion is not excluded. Electronically Signed   By: Marybelle Killings M.D.   On: 04/19/2017 13:40   Dg Chest Port 1 View  Result Date: 04/13/2017 CLINICAL DATA:  Hypoxia. EXAM: PORTABLE CHEST 1 VIEW COMPARISON:  04/12/2017 FINDINGS: Lungs are adequately inflated and demonstrate continued hazy perihilar bibasilar opacification likely persistent moderate interstitial edema and possible small bilateral pleural effusions. Stable cardiomegaly. Remainder the exam is unchanged. IMPRESSION: Stable cardiomegaly and persistent moderate  interstitial edema. Likely small amount of bilateral pleural fluid. Electronically Signed   By: Marin Olp M.D.   On: 04/13/2017 08:05   Dg Chest Port 1 View  Result Date: 04/12/2017 CLINICAL DATA:  Hypoxia EXAM: PORTABLE CHEST 1 VIEW COMPARISON:  04/11/2017 FINDINGS: Diffuse bilateral airspace disease with mild interval progression. Bilateral pleural effusions also with mild progression. Cardiac enlargement. Aortic valve replacement. Left atrial clip. Bibasilar atelectasis. IMPRESSION: Progression of congestive heart failure. Worsening edema and bilateral effusions. Electronically Signed   By: Franchot Gallo M.D.   On: 04/12/2017 06:37   Dg Chest Port 1 View  Result Date: 04/11/2017 CLINICAL DATA:  Shortness of Breath EXAM: PORTABLE CHEST 1 VIEW COMPARISON:  April 10, 2017 FINDINGS: There is cardiomegaly with pulmonary venous hypertension. There are pleural effusions bilaterally with bibasilar atelectasis. No airspace consolidation or pulmonary edema evident. There is a prosthetic aortic valve present. There is a left atrial appendage clamp. No adenopathy. There is postoperative total shoulder replacement on the left. IMPRESSION: Pulmonary vascular congestion with bibasilar atelectasis and small pleural effusions. No frank edema or consolidation evident. Areas of postoperative change noted. Electronically Signed   By: Lowella Grip III M.D.   On: 04/11/2017 11:19    Lab Data:  CBC: Recent Labs  Lab 04/18/17 0806 04/20/17 1358 04/21/17 0746 04/22/17 0654 04/23/17 0646  WBC 7.2 6.8 8.2 7.9 7.6  HGB 7.5* 9.1* 8.6* 9.1* 8.9*  HCT 22.4* 28.3* 26.8* 28.2* 27.5*  MCV 92.6 92.5 92.4 93.7 94.5  PLT 102* 96* 104* 114* 960*   Basic Metabolic Panel: Recent Labs  Lab 04/18/17 0806 04/20/17 1358 04/21/17 0703 04/22/17 0654 04/23/17 0646  NA 137 134* 134* 136 136  K 3.8 3.5 4.6 3.2* 3.2*  CL 103 97* 99* 98* 98*  CO2 '24 27 23 27 27  '$ GLUCOSE 157* 136* 134* 127* 136*  BUN 55* 20 28*  13 28*  CREATININE 4.42* 2.94* 3.69* 2.74* 4.23*  CALCIUM 8.3* 8.2* 8.4* 8.4* 8.7*  PHOS  --   --   --   --  3.9   GFR: Estimated Creatinine Clearance: 10.8 mL/min (A) (by C-G formula based on SCr of 4.23 mg/dL (H)). Liver Function Tests: Recent Labs  Lab 04/23/17 0646  ALBUMIN 2.5*   No results for input(s): LIPASE, AMYLASE in the last 168 hours. No results for input(s): AMMONIA in the last 168 hours. Coagulation Profile: No results for input(s): INR, PROTIME in the last 168 hours. Cardiac Enzymes: No results for input(s): CKTOTAL, CKMB, CKMBINDEX, TROPONINI in the last 168 hours. BNP (last 3 results) No results for input(s): PROBNP in the last 8760 hours. HbA1C: No results for input(s): HGBA1C in the last 72 hours. CBG: Recent Labs  Lab 04/22/17 0852 04/22/17 1231 04/22/17 1711 04/22/17 2153 04/23/17 0752  GLUCAP 141* 165* 188* 190* 136*   Lipid Profile: No results for input(s): CHOL, HDL, LDLCALC, TRIG, CHOLHDL, LDLDIRECT in the last 72 hours. Thyroid Function Tests: No results for input(s): TSH, T4TOTAL, FREET4, T3FREE, THYROIDAB in the last 72 hours. Anemia Panel: No results for input(s): VITAMINB12, FOLATE, FERRITIN, TIBC, IRON, RETICCTPCT in the last 72 hours. Urine analysis:    Component Value Date/Time   COLORURINE YELLOW 04/15/2017 0201   APPEARANCEUR CLOUDY (A) 04/15/2017 0201   LABSPEC 1.013 04/15/2017 0201   PHURINE 5.0 04/15/2017 0201   GLUCOSEU 50 (A) 04/15/2017 0201   HGBUR LARGE (A) 04/15/2017 0201   BILIRUBINUR NEGATIVE 04/15/2017 0201   KETONESUR NEGATIVE 04/15/2017 0201   PROTEINUR 30 (A) 04/15/2017 0201   UROBILINOGEN 1.0 12/13/2013 1556   NITRITE NEGATIVE 04/15/2017 0201   LEUKOCYTESUR LARGE (A) 04/15/2017 0201     Joy Reiger M.D. [facility] 04/23/2017, 3:03 PM  Pager: [phone] Between 7am to 7pm - call Pager - [phone]  After 7pm go to [email or web address] - password [email or web address]  Call night coverage person covering after 7pm

## 2017-04-24 ENCOUNTER — Inpatient Hospital Stay (HOSPITAL_COMMUNITY): Payer: PPO | Admitting: Anesthesiology

## 2017-04-24 ENCOUNTER — Encounter (HOSPITAL_COMMUNITY): Payer: Self-pay

## 2017-04-24 ENCOUNTER — Encounter (HOSPITAL_COMMUNITY)
Admission: EM | Disposition: A | Discharge status: Home Health | Payer: Self-pay | Source: Home / Self Care | Attending: Family Medicine

## 2017-04-24 ENCOUNTER — Telehealth: Payer: Self-pay | Admitting: Vascular Surgery

## 2017-04-24 HISTORY — PX: AV FISTULA PLACEMENT: SHX1204

## 2017-04-24 LAB — SURGICAL PCR SCREEN
MRSA, PCR: NEGATIVE
STAPHYLOCOCCUS AUREUS: POSITIVE — AB

## 2017-04-24 LAB — GLUCOSE, CAPILLARY
Glucose-Capillary: 122 mg/dL — ABNORMAL HIGH (ref 65–99)
Glucose-Capillary: 141 mg/dL — ABNORMAL HIGH (ref 65–99)
Glucose-Capillary: 114 mg/dL — ABNORMAL HIGH (ref 65–99)
Glucose-Capillary: 108 mg/dL — ABNORMAL HIGH (ref 65–99)
Glucose-Capillary: 114 mg/dL — ABNORMAL HIGH (ref 65–99)
Glucose-Capillary: 197 mg/dL — ABNORMAL HIGH (ref 65–99)

## 2017-04-24 SURGERY — INSERTION OF ARTERIOVENOUS (AV) GORE-TEX GRAFT ARM
Anesthesia: Regional | Site: Arm Upper | Laterality: Left

## 2017-04-24 MED ORDER — HEPARIN SODIUM (PORCINE) 1000 UNIT/ML IJ SOLN
INTRAMUSCULAR | Status: AC
  Filled 2017-04-24: qty 1

## 2017-04-24 MED ORDER — MEPIVACAINE HCL 1.5 % IJ SOLN
INTRAMUSCULAR | Status: DC | PRN
  Administered 2017-04-24: 20 mL via PERINEURAL

## 2017-04-24 MED ORDER — LIDOCAINE HCL (PF) 1 % IJ SOLN
INTRAMUSCULAR | Status: AC
  Filled 2017-04-24: qty 30

## 2017-04-24 MED ORDER — LIDOCAINE 2% (20 MG/ML) 5 ML SYRINGE
INTRAMUSCULAR | Status: AC
  Filled 2017-04-24: qty 5

## 2017-04-24 MED ORDER — ONDANSETRON HCL 4 MG/2ML IJ SOLN: 4.0000 mg | Freq: Once | INTRAMUSCULAR | Status: DC | PRN

## 2017-04-24 MED ORDER — 0.9 % SODIUM CHLORIDE (POUR BTL) OPTIME
TOPICAL | Status: DC | PRN
  Administered 2017-04-24: 1000 mL

## 2017-04-24 MED ORDER — ONDANSETRON HCL 4 MG/2ML IJ SOLN
INTRAMUSCULAR | Status: AC
  Filled 2017-04-24: qty 2

## 2017-04-24 MED ORDER — SODIUM CHLORIDE 0.9 % IV SOLN
INTRAVENOUS | Status: DC | PRN
  Administered 2017-04-24: 500 mL

## 2017-04-24 MED ORDER — LIDOCAINE HCL (CARDIAC) 20 MG/ML IV SOLN
INTRAVENOUS | Status: DC | PRN
  Administered 2017-04-24: 50 mg via INTRAVENOUS

## 2017-04-24 MED ORDER — ONDANSETRON HCL 4 MG/2ML IJ SOLN
INTRAMUSCULAR | Status: DC | PRN
  Administered 2017-04-24: 4 mg via INTRAVENOUS

## 2017-04-24 MED ORDER — FENTANYL CITRATE (PF) 250 MCG/5ML IJ SOLN
INTRAMUSCULAR | Status: AC
  Filled 2017-04-24: qty 5

## 2017-04-24 MED ORDER — FENTANYL CITRATE (PF) 250 MCG/5ML IJ SOLN
INTRAMUSCULAR | Status: DC | PRN
  Administered 2017-04-24: 25 ug via INTRAVENOUS

## 2017-04-24 MED ORDER — CEFAZOLIN SODIUM-DEXTROSE 2-3 GM-%(50ML) IV SOLR
INTRAVENOUS | Status: DC | PRN
  Administered 2017-04-24: 1 g via INTRAVENOUS

## 2017-04-24 MED ORDER — PROPOFOL 10 MG/ML IV BOLUS
INTRAVENOUS | Status: DC | PRN
  Administered 2017-04-24: 20 mg via INTRAVENOUS

## 2017-04-24 MED ORDER — PROPOFOL 1000 MG/100ML IV EMUL
INTRAVENOUS | Status: AC
  Filled 2017-04-24: qty 100

## 2017-04-24 MED ORDER — FENTANYL CITRATE (PF) 100 MCG/2ML IJ SOLN
INTRAMUSCULAR | Status: AC
  Administered 2017-04-24: 50 ug via INTRAVENOUS
  Filled 2017-04-24: qty 2

## 2017-04-24 MED ORDER — CHLORHEXIDINE GLUCONATE CLOTH 2 % EX PADS
6.0000 | MEDICATED_PAD | Freq: Every day | CUTANEOUS | Status: DC
  Administered 2017-04-24 – 2017-04-26 (×3): 6 via TOPICAL

## 2017-04-24 MED ORDER — HEPARIN SODIUM (PORCINE) 1000 UNIT/ML IJ SOLN
INTRAMUSCULAR | Status: DC | PRN
  Administered 2017-04-24: 5000 [IU] via INTRAVENOUS

## 2017-04-24 MED ORDER — METOPROLOL SUCCINATE ER 25 MG PO TB24: 25.0000 mg | ORAL_TABLET | Freq: Two times a day (BID) | ORAL | Status: DC

## 2017-04-24 MED ORDER — METOPROLOL SUCCINATE ER 25 MG PO TB24
25.0000 mg | ORAL_TABLET | Freq: Two times a day (BID) | ORAL | Status: DC
  Administered 2017-04-24 – 2017-04-26 (×2): 25 mg via ORAL
  Filled 2017-04-24 (×2): qty 1

## 2017-04-24 MED ORDER — FENTANYL CITRATE (PF) 100 MCG/2ML IJ SOLN: 25.0000 ug | INTRAMUSCULAR | Status: DC | PRN

## 2017-04-24 MED ORDER — PROPOFOL 500 MG/50ML IV EMUL
INTRAVENOUS | Status: DC | PRN
  Administered 2017-04-24: 75 ug/kg/min via INTRAVENOUS

## 2017-04-24 MED ORDER — MUPIROCIN 2 % EX OINT
1.0000 "application " | TOPICAL_OINTMENT | Freq: Two times a day (BID) | CUTANEOUS | Status: DC
  Administered 2017-04-24 – 2017-04-26 (×5): 1 via NASAL
  Filled 2017-04-24 (×4): qty 22

## 2017-04-24 MED ORDER — SODIUM CHLORIDE 0.9 % IV SOLN
INTRAVENOUS | Status: DC
  Administered 2017-04-24: 12:00:00 via INTRAVENOUS

## 2017-04-24 MED ORDER — FENTANYL CITRATE (PF) 100 MCG/2ML IJ SOLN
50.0000 ug | INTRAMUSCULAR | Status: DC | PRN
  Administered 2017-04-24: 50 ug via INTRAVENOUS

## 2017-04-24 MED ORDER — HEMOSTATIC AGENTS (NO CHARGE) OPTIME
TOPICAL | Status: DC | PRN
  Administered 2017-04-24: 1 via TOPICAL

## 2017-04-24 MED ORDER — LIDOCAINE-EPINEPHRINE (PF) 1 %-1:200000 IJ SOLN
INTRAMUSCULAR | Status: DC | PRN
  Administered 2017-04-24: 5 mL

## 2017-04-24 MED ORDER — PHENYLEPHRINE HCL 10 MG/ML IJ SOLN
INTRAVENOUS | Status: DC | PRN
  Administered 2017-04-24: 25 ug/min via INTRAVENOUS

## 2017-04-24 MED ORDER — MIDAZOLAM HCL 2 MG/2ML IJ SOLN
INTRAMUSCULAR | Status: AC
  Filled 2017-04-24: qty 2

## 2017-04-24 SURGICAL SUPPLY — 51 items
ADH SKN CLS APL DERMABOND .7 (GAUZE/BANDAGES/DRESSINGS) ×2
AGENT HMST SPONGE THK3/8 (HEMOSTASIS) ×1
ARMBAND PINK RESTRICT EXTREMIT (MISCELLANEOUS) ×12 IMPLANT
BANDAGE ACE 4X5 VEL STRL LF (GAUZE/BANDAGES/DRESSINGS) ×2 IMPLANT
BNDG GAUZE ELAST 4 BULKY (GAUZE/BANDAGES/DRESSINGS) ×2 IMPLANT
CANISTER SUCT 3000ML PPV (MISCELLANEOUS) ×6 IMPLANT
CLIP VESOCCLUDE MED 6/CT (CLIP) ×6 IMPLANT
CLIP VESOCCLUDE SM WIDE 6/CT (CLIP) ×6 IMPLANT
COVER PROBE W GEL 5X96 (DRAPES) ×3 IMPLANT
DECANTER SPIKE VIAL GLASS SM (MISCELLANEOUS) ×3 IMPLANT
DERMABOND ADVANCED (GAUZE/BANDAGES/DRESSINGS) ×4
DERMABOND ADVANCED .7 DNX12 (GAUZE/BANDAGES/DRESSINGS) ×2 IMPLANT
ELECT REM PT RETURN 9FT ADLT (ELECTROSURGICAL) ×6
ELECTRODE REM PT RTRN 9FT ADLT (ELECTROSURGICAL) ×2 IMPLANT
GAUZE SPONGE 4X4 12PLY STRL LF (GAUZE/BANDAGES/DRESSINGS) ×2 IMPLANT
GLOVE BIO SURGEON STRL SZ 6.5 (GLOVE) ×2 IMPLANT
GLOVE BIO SURGEON STRL SZ7 (GLOVE) ×3 IMPLANT
GLOVE BIO SURGEON STRL SZ7.5 (GLOVE) ×3 IMPLANT
GLOVE BIO SURGEONS STRL SZ 6.5 (GLOVE) ×2
GLOVE BIOGEL PI IND STRL 6.5 (GLOVE) IMPLANT
GLOVE BIOGEL PI IND STRL 7.5 (GLOVE) ×1 IMPLANT
GLOVE BIOGEL PI INDICATOR 6.5 (GLOVE) ×2
GLOVE BIOGEL PI INDICATOR 7.5 (GLOVE) ×2
GOWN STRL REUS W/ TWL LRG LVL3 (GOWN DISPOSABLE) ×5 IMPLANT
GOWN STRL REUS W/ TWL XL LVL3 (GOWN DISPOSABLE) ×1 IMPLANT
GOWN STRL REUS W/TWL LRG LVL3 (GOWN DISPOSABLE) ×15
GOWN STRL REUS W/TWL XL LVL3 (GOWN DISPOSABLE) ×3
GRAFT GORETEX STRT 4-7X45 (Vascular Products) ×2 IMPLANT
HEMOSTAT SNOW SURGICEL 2X4 (HEMOSTASIS) IMPLANT
HEMOSTAT SPONGE AVITENE ULTRA (HEMOSTASIS) ×2 IMPLANT
INSERT FOGARTY SM (MISCELLANEOUS) ×3 IMPLANT
KIT BASIN OR (CUSTOM PROCEDURE TRAY) ×6 IMPLANT
KIT ROOM TURNOVER OR (KITS) ×6 IMPLANT
NS IRRIG 1000ML POUR BTL (IV SOLUTION) ×6 IMPLANT
PACK CV ACCESS (CUSTOM PROCEDURE TRAY) ×6 IMPLANT
PAD ARMBOARD 7.5X6 YLW CONV (MISCELLANEOUS) ×12 IMPLANT
STAPLER VISISTAT 35W (STAPLE) ×4 IMPLANT
SUT GORETEX 6.0 TH-9 30 IN (SUTURE) IMPLANT
SUT GORETEX 6.0 TT13 (SUTURE) IMPLANT
SUT GORETEX CV-6TTC-13 36IN (SUTURE) IMPLANT
SUT MNCRL AB 4-0 PS2 18 (SUTURE) ×1 IMPLANT
SUT PROLENE 6 0 BV (SUTURE) ×5 IMPLANT
SUT PROLENE 7 0 BV 1 (SUTURE) IMPLANT
SUT SILK 2 0 PERMA HAND 18 BK (SUTURE) IMPLANT
SUT SILK 2 0 SH (SUTURE) IMPLANT
SUT VIC AB 3-0 SH 27 (SUTURE) ×12
SUT VIC AB 3-0 SH 27X BRD (SUTURE) ×4 IMPLANT
SYR TOOMEY 50ML (SYRINGE) IMPLANT
TOWEL GREEN STERILE (TOWEL DISPOSABLE) ×6 IMPLANT
UNDERPAD 30X30 (UNDERPADS AND DIAPERS) ×6 IMPLANT
WATER STERILE IRR 1000ML POUR (IV SOLUTION) ×6 IMPLANT

## 2017-04-24 NOTE — Progress Notes (Signed)
PT Cancellation Note  Patient Details Name: Ronald [name] MRN: [id number] DOB: November 18, 1922   Cancelled Treatment:    Reason Eval/Treat Not Completed: Patient at procedure or test/unavailable. RN reports pt is prepping to leave for surgery. Will check back tomorrow.  Benjiman Core, PTA Pager [phone] Acute Rehab  Allena Katz 04/24/2017, 11:03 AM

## 2017-04-24 NOTE — Interval H&P Note (Signed)
   History and Physical Update  The patient was interviewed and re-examined.  The patient's previous History and Physical has been reviewed and is unchanged from Dr. Stephens Shire consult.  There is no change in the plan of care: per Dr. Donzetta Matters, plan is LUA AVG placement.   Risk, benefits, and alternatives to access surgery were discussed.    The patient is aware the risks include but are not limited to: bleeding, infection, steal syndrome, nerve damage, ischemic monomelic neuropathy, thrombosis, failure to mature, complications related to venous hypertension, need for additional procedures, death and stroke.    The patient agrees to proceed forward with the procedure.   Adele Barthel, MD, FACS [facility] Office: [phone] Pager: [phone]  04/24/2017, 11:18 AM

## 2017-04-24 NOTE — Op Note (Signed)
OPERATIVE NOTE   PROCEDURE:  left upper arm arteriovenous graft  PRE-OPERATIVE DIAGNOSIS: end stage renal disease   POST-OPERATIVE DIAGNOSIS: same as above   SURGEON: Adele Barthel, MD  ASSISTANT(S): Leontine Locket, PAC   ANESTHESIA: regional  ESTIMATED BLOOD LOSS: 30 cc  FINDING(S):  Palpable thrill in graft at end of the case  Palpable radial artery at end of case  SPECIMEN(S):  none  INDICATIONS:   Ronald [name] is a 84 y.o. male who presents with end stage renal disease requiring hemodialysis.  The patient present for left upper arm arteriovenous fistula vs. arteriovenous graft.  Risk, benefits, and alternatives to access surgery were discussed.  The patient is aware the risks include but are not limited to: bleeding, infection, steal syndrome, nerve damage, ischemic monomelic neuropathy, failure to mature, and need for additional procedures.  The patient is aware of the risks and elects to proceed forward.   DESCRIPTION: After full informed written consent was obtained from the patient, the patient was brought back to the operating room and placed supine upon the operating table.  The patient was given IV antibiotics prior to proceeding.  After obtaining adequate sedation, the patient was prepped and draped in standard fashion for a left arm access procedure.  This patient's vein mapping demonstrated inadequate vein for single stage basilic vein transposition or any other arteriovenous fistula, so I proceeded with left upper arm arteriovenous graft placement as the basilic vein was too small to support a forearm loop arteriovenous graft.  I turned my attention first to the antecubitum.  Under ultrasound guidance, I identified the location of the brachial artery and marked it on the skin.  I then examined the bicipital groove and identified the high brachial vein and marked it on the skin.    I made an incision over the brachial artery and dissected through the subcutaneous  tissue to the fascia carefully and was able to dissect out the brachial artery.  The artery was about 4-4.5 mm externally.  It was controlled proximally and distally with vessel loops   I turned my attention to the high bicipital groove.  I made an incision overlying the proximal vein and dissected through the subcutaneous tissue and fascia until I reached the high brachial vein.  Externally, it appeared to be 2-3 mm in diameter.  As this was inadequate, I looked more proximally and identified an axillary vein that was bigger.  I made another incision in the left axilla and then dissected down through the subcutaneous tissue and fascia until I had exposure of an axillary vein that appeared to 6-7 mm in diameter.  I then dissected out this vein proximally and distally and placed a vessel loop.  I took a Dietitian and dissected from the arterial exposure up to the venous exposure.  In this process, the skin in the distal exposure torn slightly, demonstrated the fragile nature of this patient's skin.  I then delivered a 4 x 7-mm stretch Gore-Tex graft through the metal tunnel and then pulled out the metal tunnel leaving the graft in place.  The 4-mm end of the graft was left in the arterial exposure and the 7 mm graft was left in the venous exposure.    I then gave the patient 5000 units of heparin to gain some anticoagulation.  After waiting 3 minutes, I placed the brachial artery under tension proximally and distally with vessel loops.  I then made an arteriotomy in the brachial artery  and extended it with a Potts scissor.  I sewed the 4-mm end of the graft to this arteriotomy with a running stitch of 6-0 Prolene.  I released the vessel loops on the inflow and allowed the artery to decompress through the graft. There was good pulsatile bleeding from this graft.  I clamped the graft near its arterial anastomosis and sucked out all the blood in the graft and loaded the graft with heparinized saline.     At this point, I pulled the graft to appropriate length and reset my exposure of the axillary vein.  I tied off the vein distally with a 2-0 silk and then transected it.  There was adequate venous backbleeding from the vein.  I then injected some heparinized saline into this vein and then clamped it.  When I dilated the vein, it was >8 mm in diameter, so I did not have to spatulate the vein.  I transected the graft to appropriate length.  This graft was sewn to the vein in an end-to-end configuration with a 6-0 Prolene.  Prior to completing this anastomosis, I allowed the vein to backbleed.  I also allowed the graft to bleed in an antegrade fashion.  There was no clot observe from either end of the anastomosis.  I completed this anastomosis in the usual fashion.  At this point, I irrigated out the venous exposure and then packed Avitene in this incision.  I turned my attention back to the arterial exposure.  This incision was washed out also.  I repacked the incision with Avitene.  I tested the distal arterial flow with a continuous doppler: Palpable radial artery.  The brachial artery proximally and distally had multiphasic waveforms.  The venous outflow had a flow signature: consistent with a widely patent arteriovenous graft.  At this point, I washed out the arterial exposure.  There was no more active bleeding.  The subcutaneous tissue was reapproximated with a running stitch of 3-0 Vicryl.  The skin was then reapproximated with staples due to the fragility.  I had to sew corner stitch into a tear in the skin with a 4-0 Monocryl.  The skin was then cleaned, dried, and dressed with a sterile dressing.  I then repeated this same process in the vein exposure.  Similarly the bleeding was evaluated and the subcutaneous tissue and skin reapproximated and closed.   COMPLICATIONS: none  CONDITION: stable   Adele Barthel, MD, [facility] [place] Office: [phone] Pager:  [phone]  04/24/2017, 2:32 PM

## 2017-04-24 NOTE — Telephone Encounter (Signed)
Sched staple removal 05/07/17 at 3:00 w the PA. Sched 4 w PA f/u 05/21/17 at 2:30. Lm on daughter's # to inform them of appts.

## 2017-04-24 NOTE — Transfer of Care (Signed)
Immediate Anesthesia Transfer of Care Note  Patient: Ronald [name]  Procedure(s) Performed: INSERTION OF ARTERIOVENOUS (AV) GORE-TEX GRAFT ARM (Left Arm Upper)  Patient Location: PACU  Anesthesia Type:Regional  Level of Consciousness: awake, alert  and oriented  Airway & Oxygen Therapy: Patient Spontanous Breathing and Patient connected to face mask oxygen  Post-op Assessment: Report given to RN and Post -op Vital signs reviewed and stable  Post vital signs: Reviewed and stable  Last Vitals:  Vitals:   04/24/17 1230 04/24/17 1235  BP: (!) 184/60 (!) 184/47  Pulse: 75 74  Resp: (!) 21 18  Temp:    SpO2: 100% 99%    Last Pain:  Vitals:   04/24/17 0547  TempSrc: Oral  PainSc:          Complications: No apparent anesthesia complications

## 2017-04-24 NOTE — Anesthesia Procedure Notes (Signed)
Procedure Name: MAC Date/Time: 04/24/2017 1:21 PM Performed by: Teressa Lower., CRNA Pre-anesthesia Checklist: Patient identified, Emergency Drugs available, Suction available, Patient being monitored and Timeout performed Patient Re-evaluated:Patient Re-evaluated prior to induction Oxygen Delivery Method: Simple face mask Ventilation: Oral airway inserted - appropriate to patient size

## 2017-04-24 NOTE — Progress Notes (Signed)
[facility]                                                                              Patient Demographics  Mazi Brailsford, is a 84 y.o. male, DOB - 09/20/1922, CZY:[id number]  Admit date - 04/10/2017   Admitting Physician Cristy Folks, MD  Outpatient Primary MD for the patient is [name], MD  Outpatient specialists:   LOS - 14  days   Medical records reviewed and are as summarized below:    Chief Complaint  Patient presents with  . Shortness of Breath       Brief summary   89yom presented with SOB, tarry stools. Admitted for acute on chronic diastolic CHF, acute GIB, AKI, elevated INR on warfarin. Seen by GI but Hgb remained stable. Warfarin stopped. GI recommended no intervention and signed off. Seen by nephrology with initial rec for conservative care, no HD. Subsequently patient opted for HD and was transferred to [facility] 2/20. HD started 2/21      Assessment & Plan    Principal Problem:   Acute renal failure superimposed on stage 3 chronic kidney disease ([place]), now ESRD, started on hemodialysis -Started on HD on 2/21, discussed with nephrology, Dr. Jimmy Footman - Per Dr. Jimmy Footman, patient will need permanent access prior to discharge, need access before he can be clipped for outpatient dialysis center -Vascular surgery consulted, plan for permanent access today  Active Problems: Acute on chronic diastolic CHF -Volume management with hemodialysis -Continue Toprol-XL  GI bleed, coagulopathy on admission, high INR - GI recommended conservative care -Continue Protonix -H&H currently stable, monitor closely  Acute blood loss anemia superimposed on anemia of chronic disease -H&H stable  Chronic atrial fibrillation -Rate controlled, continue Toprol-XL -Warfarin on hold for permanent access placement  Diabetes mellitus type 2 -Continue sliding scale insulin  Bioprosthetic AVR  COPD -Currently stable  Essential hypertension  - BP  somewhat elevated increase metoprolol to 25 mg twice a day, -Add hydralazine as needed with parameters   Code Status:  DNR  DVT Prophylaxis:  SCD's Family Communication: Discussed in detail with the patient, all imaging results, lab results explained to the patient   Disposition Plan: When cleared by renal  Time Spent in minutes 25 minutes  Procedures:  Hemodialysis Left IJ tunneled catheter  Consultants:   Nephrology Vascular surgery  Antimicrobials:   None   Medications  Scheduled Meds: . [MAR Hold] buPROPion  150 mg Oral Daily  . [MAR Hold] Chlorhexidine Gluconate Cloth  6 each Topical Daily  . [MAR Hold] darbepoetin (ARANESP) injection - DIALYSIS  100 mcg Intravenous Q Mon-HD  . [MAR Hold] doxazosin  2 mg Oral QHS  . [MAR Hold] finasteride  5 mg Oral QHS  . [MAR Hold] fluticasone  1 spray Each Nare BID  . [MAR Hold] insulin aspart  0-5 Units Subcutaneous QHS  . [MAR Hold] insulin aspart  0-9 Units Subcutaneous TID WC  . [MAR Hold] loratadine  10 mg Oral Daily  . [MAR Hold] Melatonin  4.5 mg Oral QHS  . [MAR Hold] metoprolol succinate  12.5 mg Oral BID WC  . [MAR Hold] multivitamin  1 tablet Oral QHS  . [MAR Hold] mupirocin ointment  1 application Nasal BID  . [MAR Hold] oxymetazoline  2 spray Right Nare UD  . [MAR Hold] pantoprazole  40 mg Oral Daily  . [MAR Hold] simvastatin  20 mg Oral QPM  . [MAR Hold] sodium chloride flush  3 mL Intravenous Q12H  . [MAR Hold] umeclidinium-vilanterol  1 puff Inhalation Daily   Continuous Infusions: . sodium chloride 10 mL/hr at 04/24/17 1135  . ferric gluconate (FERRLECIT/NULECIT) IV Stopped (04/23/17 1339)   PRN Meds:.[MAR Hold] acetaminophen **OR** [MAR Hold] acetaminophen, [MAR Hold] albuterol, [MAR Hold] camphor-menthol, [MAR Hold] diphenhydrAMINE, fentaNYL (SUBLIMAZE) injection, [MAR Hold] ondansetron **OR** [MAR Hold] ondansetron (ZOFRAN) IV, [MAR Hold] polyethylene glycol, [MAR Hold] traMADol   Antibiotics    Anti-infectives (From admission, onward)   Start     Dose/Rate Route Frequency Ordered Stop   04/17/17 1200  ceFAZolin (ANCEF) IVPB 2g/100 mL premix     2 g 200 mL/hr over 30 Minutes Intravenous To Radiology 04/16/17 1723 04/18/17 1200        Subjective:   Khayree Delellis was seen and examined today.  Feels better, no acute shortness of breath.  No fevers or chills. Patient denies dizziness, abdominal pain, N/V/D/C, new weakness, numbess, tingling. No acute events overnight.    Objective:   Vitals:   04/24/17 1220 04/24/17 1225 04/24/17 1230 04/24/17 1235  BP: (!) 164/68 (!) 180/75 (!) 184/60 (!) 184/47  Pulse: 76 77 75 74  Resp: 19 (!) 21 (!) 21 18  Temp:      TempSrc:      SpO2: 99% 100% 100% 99%  Weight:      Height:        Intake/Output Summary (Last 24 hours) at 04/24/2017 1312 Last data filed at 04/23/2017 1700 Gross per 24 hour  Intake -  Output 400 ml  Net -400 ml     Wt Readings from Last 3 Encounters:  04/23/17 69.7 kg (153 lb 10.6 oz)  03/13/17 77.6 kg (171 lb)  12/31/16 81.2 kg (179 lb)     Exam   General: Alert and oriented x 3, NAD  Eyes:  HEENT:    Cardiovascular: S1 S2clear. Regular rate and rhythm. No pedal edema b/l  Respiratory: Clear to auscultation bilaterally, no wheezing, rales or rhonchi  Gastrointestinal: Soft, nontender, nondistended, + bowel sounds  Ext: no pedal edema bilaterally  Neuro: no new deficits  Musculoskeletal: No digital cyanosis, clubbing  Skin: No rashes  Psych: Normal affect and demeanor, alert and oriented x3    Data Reviewed:  I have personally reviewed following labs and imaging studies  Micro Results Recent Results (from the past 240 hour(s))  Culture, Urine     Status: Abnormal   Collection Time: 04/15/17  2:01 AM  Result Value Ref Range Status   Specimen Description   Final    URINE, RANDOM Performed at [facility] [address], [place], [place] [address]    Special  Requests   Final    NONE Performed at [facility], [place] [address], [place], [place] [address]    Culture MULTIPLE SPECIES PRESENT, SUGGEST RECOLLECTION (A)  Final   Report Status 04/16/2017 FINAL  Final  Surgical pcr screen     Status: Abnormal   Collection Time: 04/24/17 12:39 AM  Result Value Ref Range Status   MRSA, PCR NEGATIVE NEGATIVE Final   Staphylococcus aureus POSITIVE (A) NEGATIVE Final    Comment: (NOTE) The  Xpert SA Assay (FDA approved for NASAL specimens in patients 62 years of age and older), is one component of a comprehensive surveillance program. It is not intended to diagnose infection nor to guide or monitor treatment. Performed at [facility], [place] [address] [place], [place] [address]     Radiology Reports Dg Chest 2 View  Result Date: 04/10/2017 CLINICAL DATA:  Shortness of breath. EXAM: CHEST  2 VIEW COMPARISON:  Radiograph 03/10/2017 FINDINGS: Post median sternotomy with prosthetic aortic valve. Prior left atrial clipping. Unchanged cardiomegaly. Progressive pulmonary edema from prior exam. Increased bilateral pleural effusions. No pneumothorax. Left shoulder surgical hardware is partially included. IMPRESSION: Progressive pulmonary edema and increased bilateral pleural effusions consistent with worsening CHF. Electronically Signed   By: Jeb Levering M.D.   On: 04/10/2017 03:21   US Renal  Result Date: 04/12/2017 CLINICAL DATA:  Acute kidney injury EXAM: RENAL / URINARY TRACT ULTRASOUND COMPLETE COMPARISON:  CT abdomen pelvis 06/29/2011 FINDINGS: Right Kidney: Length: 11.2 cm. Cortical thinning diffusely with increased echogenicity of the cortex. Right upper pole cyst 14 mm. Negative for hydronephrosis or mass. Left Kidney: Length: 11.2 cm. Mild increased echogenicity of the cortex. Left upper pole cyst 2.4 cm. Negative for mass or hydronephrosis. Bladder: Appears normal for degree of bladder distention. IMPRESSION: Increased  echogenicity of the renal cortex bilaterally. Cortical thinning noted in the right kidney. Bilateral renal cysts. Negative for hydronephrosis. Electronically Signed   By: Franchot Gallo M.D.   On: 04/12/2017 13:04   Ir Fluoro Guide Cv Line Left  Result Date: 04/17/2017 INDICATION: 84 year old male with acute CHF and acute exacerbation of chronic kidney disease now requiring hemodialysis. He presents for placement of a tunneled hemodialysis catheter. He has a vesicular rash overlying his right chest that appears to represent herpes zoster. Therefore, we will proceed with placement of a left-sided HD catheter. EXAM: TUNNELED CENTRAL VENOUS HEMODIALYSIS CATHETER PLACEMENT WITH ULTRASOUND AND FLUOROSCOPIC GUIDANCE MEDICATIONS: 2 g Ancef. The antibiotic was given in an appropriate time interval prior to skin puncture. ANESTHESIA/SEDATION: 25 mg mcg fentanyl administered intravenously for pain control. This does not constitute conscious sedation. FLUOROSCOPY TIME:  Fluoroscopy Time: 1 minutes 0 seconds (3 mGy). COMPLICATIONS: None immediate. PROCEDURE: Informed written consent was obtained from the patient after a discussion of the risks, benefits, and alternatives to treatment. Questions regarding the procedure were encouraged and answered. The left neck and chest were prepped with chlorhexidine in a sterile fashion, and a sterile drape was applied covering the operative field. Maximum barrier sterile technique with sterile gowns and gloves were used for the procedure. A timeout was performed prior to the initiation of the procedure. After creating a small venotomy incision, a micropuncture kit was utilized to access the left internal jugular vein under direct, real-time ultrasound guidance after the overlying soft tissues were anesthetized with 1% lidocaine with epinephrine. Ultrasound image documentation was performed. The microwire was kinked to measure appropriate catheter length. A stiff Glidewire was advanced  to the level of the IVC and the micropuncture sheath was exchanged for a peel-away sheath. A palindrome tunneled hemodialysis catheter measuring 28 cm from tip to cuff was tunneled in a retrograde fashion from the anterior chest wall to the venotomy incision. The catheter was then placed through the peel-away sheath with tips ultimately positioned within the superior aspect of the right atrium. Final catheter positioning was confirmed and documented with a spot radiographic image. The catheter aspirates and flushes normally. The catheter was flushed with appropriate volume  heparin dwells. The catheter exit site was secured with a 0-Prolene retention suture. The venotomy incision was closed with Dermabond. Dressings were applied. The patient tolerated the procedure well without immediate post procedural complication. IMPRESSION: Successful placement of 28 cm tip to cuff tunneled hemodialysis catheter via the left internal jugular vein with tips terminating within the superior aspect of the right atrium. The catheter is ready for immediate use. Electronically Signed   By: Jacqulynn Cadet M.D.   On: 04/17/2017 10:42   Ir US Guide Vasc Access Left  Result Date: 04/17/2017 INDICATION: 84 year old male with acute CHF and acute exacerbation of chronic kidney disease now requiring hemodialysis. He presents for placement of a tunneled hemodialysis catheter. He has a vesicular rash overlying his right chest that appears to represent herpes zoster. Therefore, we will proceed with placement of a left-sided HD catheter. EXAM: TUNNELED CENTRAL VENOUS HEMODIALYSIS CATHETER PLACEMENT WITH ULTRASOUND AND FLUOROSCOPIC GUIDANCE MEDICATIONS: 2 g Ancef. The antibiotic was given in an appropriate time interval prior to skin puncture. ANESTHESIA/SEDATION: 25 mg mcg fentanyl administered intravenously for pain control. This does not constitute conscious sedation. FLUOROSCOPY TIME:  Fluoroscopy Time: 1 minutes 0 seconds (3 mGy).  COMPLICATIONS: None immediate. PROCEDURE: Informed written consent was obtained from the patient after a discussion of the risks, benefits, and alternatives to treatment. Questions regarding the procedure were encouraged and answered. The left neck and chest were prepped with chlorhexidine in a sterile fashion, and a sterile drape was applied covering the operative field. Maximum barrier sterile technique with sterile gowns and gloves were used for the procedure. A timeout was performed prior to the initiation of the procedure. After creating a small venotomy incision, a micropuncture kit was utilized to access the left internal jugular vein under direct, real-time ultrasound guidance after the overlying soft tissues were anesthetized with 1% lidocaine with epinephrine. Ultrasound image documentation was performed. The microwire was kinked to measure appropriate catheter length. A stiff Glidewire was advanced to the level of the IVC and the micropuncture sheath was exchanged for a peel-away sheath. A palindrome tunneled hemodialysis catheter measuring 28 cm from tip to cuff was tunneled in a retrograde fashion from the anterior chest wall to the venotomy incision. The catheter was then placed through the peel-away sheath with tips ultimately positioned within the superior aspect of the right atrium. Final catheter positioning was confirmed and documented with a spot radiographic image. The catheter aspirates and flushes normally. The catheter was flushed with appropriate volume heparin dwells. The catheter exit site was secured with a 0-Prolene retention suture. The venotomy incision was closed with Dermabond. Dressings were applied. The patient tolerated the procedure well without immediate post procedural complication. IMPRESSION: Successful placement of 28 cm tip to cuff tunneled hemodialysis catheter via the left internal jugular vein with tips terminating within the superior aspect of the right atrium. The  catheter is ready for immediate use. Electronically Signed   By: Jacqulynn Cadet M.D.   On: 04/17/2017 10:42   Dg Chest Port 1 View  Result Date: 04/19/2017 CLINICAL DATA:  Pulmonary edema EXAM: PORTABLE CHEST 1 VIEW COMPARISON:  04/13/2017 FINDINGS: Left jugular dialysis catheter has been placed with its tip at the cavoatrial junction. Atrial appendage stable remains in place. Hazy hazy airspace disease compatible with pulmonary edema is stable on the left and worse in the right lung. Small right pleural effusion is not excluded. IMPRESSION: Bilateral hazy pulmonary edema is stable on the left and worsening on the right. Small right pleural  effusion is not excluded. Electronically Signed   By: Marybelle Killings M.D.   On: 04/19/2017 13:40   Dg Chest Port 1 View  Result Date: 04/13/2017 CLINICAL DATA:  Hypoxia. EXAM: PORTABLE CHEST 1 VIEW COMPARISON:  04/12/2017 FINDINGS: Lungs are adequately inflated and demonstrate continued hazy perihilar bibasilar opacification likely persistent moderate interstitial edema and possible small bilateral pleural effusions. Stable cardiomegaly. Remainder the exam is unchanged. IMPRESSION: Stable cardiomegaly and persistent moderate interstitial edema. Likely small amount of bilateral pleural fluid. Electronically Signed   By: Marin Olp M.D.   On: 04/13/2017 08:05   Dg Chest Port 1 View  Result Date: 04/12/2017 CLINICAL DATA:  Hypoxia EXAM: PORTABLE CHEST 1 VIEW COMPARISON:  04/11/2017 FINDINGS: Diffuse bilateral airspace disease with mild interval progression. Bilateral pleural effusions also with mild progression. Cardiac enlargement. Aortic valve replacement. Left atrial clip. Bibasilar atelectasis. IMPRESSION: Progression of congestive heart failure. Worsening edema and bilateral effusions. Electronically Signed   By: Franchot Gallo M.D.   On: 04/12/2017 06:37   Dg Chest Port 1 View  Result Date: 04/11/2017 CLINICAL DATA:  Shortness of Breath EXAM: PORTABLE CHEST  1 VIEW COMPARISON:  April 10, 2017 FINDINGS: There is cardiomegaly with pulmonary venous hypertension. There are pleural effusions bilaterally with bibasilar atelectasis. No airspace consolidation or pulmonary edema evident. There is a prosthetic aortic valve present. There is a left atrial appendage clamp. No adenopathy. There is postoperative total shoulder replacement on the left. IMPRESSION: Pulmonary vascular congestion with bibasilar atelectasis and small pleural effusions. No frank edema or consolidation evident. Areas of postoperative change noted. Electronically Signed   By: Lowella Grip III M.D.   On: 04/11/2017 11:19    Lab Data:  CBC: Recent Labs  Lab 04/18/17 0806 04/20/17 1358 04/21/17 0746 04/22/17 0654 04/23/17 0646  WBC 7.2 6.8 8.2 7.9 7.6  HGB 7.5* 9.1* 8.6* 9.1* 8.9*  HCT 22.4* 28.3* 26.8* 28.2* 27.5*  MCV 92.6 92.5 92.4 93.7 94.5  PLT 102* 96* 104* 114* 106*   Basic Metabolic Panel: Recent Labs  Lab 04/18/17 0806 04/20/17 1358 04/21/17 0703 04/22/17 0654 04/23/17 0646  NA 137 134* 134* 136 136  K 3.8 3.5 4.6 3.2* 3.2*  CL 103 97* 99* 98* 98*  CO2 _0 GLUCOSE 157* 136* 134* 127* 136*  BUN 55* 20 28* 13 28*  CREATININE 4.42* 2.94* 3.69* 2.74* 4.23*  CALCIUM 8.3* 8.2* 8.4* 8.4* 8.7*  PHOS  --   --   --   --  3.9   GFR: Estimated Creatinine Clearance: 10.8 mL/min (A) (by C-G formula based on SCr of 4.23 mg/dL (H)). Liver Function Tests: Recent Labs  Lab 04/23/17 0646  ALBUMIN 2.5*   No results for input(s): LIPASE, AMYLASE in the last 168 hours. No results for input(s): AMMONIA in the last 168 hours. Coagulation Profile: No results for input(s): INR, PROTIME in the last 168 hours. Cardiac Enzymes: No results for input(s): CKTOTAL, CKMB, CKMBINDEX, TROPONINI in the last 168 hours. BNP (last 3 results) No results for input(s): PROBNP in the last 8760 hours. HbA1C: No results for input(s): HGBA1C in the last 72  hours. CBG: Recent Labs  Lab 04/23/17 1627 04/23/17 2134 04/24/17 0434 04/24/17 0826 04/24/17 1110  GLUCAP 115* 178* 122* 141* 114*   Lipid Profile: No results for input(s): CHOL, HDL, LDLCALC, TRIG, CHOLHDL, LDLDIRECT in the last 72 hours. Thyroid Function Tests: No results for input(s): TSH, T4TOTAL, FREET4, T3FREE, THYROIDAB in the last 72 hours. Anemia  Panel: No results for input(s): VITAMINB12, FOLATE, FERRITIN, TIBC, IRON, RETICCTPCT in the last 72 hours. Urine analysis:    Component Value Date/Time   COLORURINE YELLOW 04/15/2017 0201   APPEARANCEUR CLOUDY (A) 04/15/2017 0201   LABSPEC 1.013 04/15/2017 0201   PHURINE 5.0 04/15/2017 0201   GLUCOSEU 50 (A) 04/15/2017 0201   HGBUR LARGE (A) 04/15/2017 0201   BILIRUBINUR NEGATIVE 04/15/2017 0201   KETONESUR NEGATIVE 04/15/2017 0201   PROTEINUR 30 (A) 04/15/2017 0201   UROBILINOGEN 1.0 12/13/2013 1556   NITRITE NEGATIVE 04/15/2017 0201   LEUKOCYTESUR LARGE (A) 04/15/2017 0201     Ripudeep Rai M.D. [facility] 04/24/2017, 1:12 PM  Pager: [phone] Between 7am to 7pm - call Pager - [phone]  After 7pm go to [email or web address] - password [email or web address]  Call night coverage person covering after 7pm

## 2017-04-24 NOTE — Discharge Instructions (Signed)
° °  [facility]  Discharge Instructions  AV Fistula or Graft Surgery for Dialysis Access  Please refer to the following instructions for your post-procedure care. Your surgeon or physician assistant will discuss any changes with you.  Activity  You may drive the day following your surgery, if you are comfortable and no longer taking prescription pain medication. Resume full activity as the soreness in your incision resolves.  Bathing/Showering  You may shower after you go home. Keep your incision dry for 48 hours. Do not soak in a bathtub, hot tub, or swim until the incision heals completely. You may not shower if you have a hemodialysis catheter.  Incision Care  Clean your incision with mild soap and water after 48 hours. Pat the area dry with a clean towel. You do not need a bandage unless otherwise instructed. Do not apply any ointments or creams to your incision. You may have skin glue on your incision. Do not peel it off. It will come off on its own in about one week. Your arm may swell a bit after surgery. To reduce swelling use pillows to elevate your arm so it is above your heart. Your doctor will tell you if you need to lightly wrap your arm with an ACE bandage.  Diet  Resume your normal diet. There are not special food restrictions following this procedure. In order to heal from your surgery, it is CRITICAL to get adequate nutrition. Your body requires vitamins, minerals, and protein. Vegetables are the best source of vitamins and minerals. Vegetables also provide the perfect balance of protein. Processed food has little nutritional value, so try to avoid this.  Medications  Resume taking all of your medications. If your incision is causing pain, you may take over-the counter pain relievers such as acetaminophen (Tylenol). If you were prescribed a stronger pain medication, please be aware these medications can cause nausea and constipation. Prevent  nausea by taking the medication with a snack or meal. Avoid constipation by drinking plenty of fluids and eating foods with high amount of fiber, such as fruits, vegetables, and grains.  Do not take Tylenol if you are taking prescription pain medications.  Follow up Your surgeon may want to see you in the office following your access surgery. If so, this will be arranged at the time of your surgery.  Please call us immediately for any of the following conditions:  Increased pain, redness, drainage (pus) from your incision site Fever of 101 degrees or higher Severe or worsening pain at your incision site Hand pain or numbness.  Reduce your risk of vascular disease:  Stop smoking. If you would like help, call [facility] at 1-800-QUIT-NOW (854)072-1004) or [place] [facility] your cholesterol Maintain a desired weight Control your diabetes Keep your blood pressure down  Dialysis  It will take several weeks to several months for your new dialysis access to be ready for use. Your surgeon will determine when it is okay to use it. Your nephrologist will continue to direct your dialysis. You can continue to use your Permcath until your new access is ready for use.   04/24/2017 BOND GRIESHOP [id number] 1922/11/18  Surgeon(s): Conrad Sorrento, MD  Procedure(s): INSERTION OF ARTERIOVENOUS (AV) GORE-TEX GRAFT ARM LEFT UPPER ARM  X Do not stick graft for 4 weeks    If you have any questions, please call the office at [phone].

## 2017-04-24 NOTE — Progress Notes (Signed)
Subjective: Interval History: has no complaint .  Objective: Vital signs in last 24 hours: Temp:  [98.1 F (36.7 C)-98.4 F (36.9 C)] 98.1 F (36.7 C) (02/28 0547) Pulse Rate:  [71-93] 93 (02/28 0855) Resp:  [17-22] 18 (02/28 0855) BP: (108-153)/(44-60) 148/57 (02/28 0547) SpO2:  [93 %-99 %] 93 % (02/28 0547) FiO2 (%):  [99 %] 99 % (02/28 0855) Weight:  [69.7 kg (153 lb 10.6 oz)] 69.7 kg (153 lb 10.6 oz) (02/27 1307) Weight change: -0.602 kg (-5.3 oz)  Intake/Output from previous day: 02/27 0701 - 02/28 0700 In: -  Out: 2900 [Urine:400] Intake/Output this shift: No intake/output data recorded.  General appearance: cooperative, pale and HOH Resp: diminished breath sounds bilaterally Chest wall: PC Cardio: S1, S2 normal and systolic murmur: systolic ejection 2/6, decrescendo at 2nd left intercostal space GI: soft, non-tender; bowel sounds normal; no masses,  no organomegaly Extremities: extremities normal, atraumatic, no cyanosis or edema  Lab Results: Recent Labs    04/22/17 0654 04/23/17 0646  WBC 7.9 7.6  HGB 9.1* 8.9*  HCT 28.2* 27.5*  PLT 114* 118*   BMET:  Recent Labs    04/22/17 0654 04/23/17 0646  NA 136 136  K 3.2* 3.2*  CL 98* 98*  CO2 27 27  GLUCOSE 127* 136*  BUN 13 28*  CREATININE 2.74* 4.23*  CALCIUM 8.4* 8.7*   Recent Labs    04/21/17 1828  PTH 84*   Iron Studies: No results for input(s): IRON, TIBC, TRANSFERRIN, FERRITIN in the last 72 hours.  Studies/Results: No results found.  I have reviewed the patient's current medications.  Assessment/Plan: 1 ESRD for HD tomorrow. Access to be done tomorrow.  Vol ok 2 anemia esa/Fe 3 HPTH ok 4 HOH 5 AVR  6 DM P HD , access, CLIP   LOS: 14 days   Ronald [name] 04/24/2017,9:33 AM

## 2017-04-24 NOTE — Anesthesia Procedure Notes (Signed)
Anesthesia Regional Block: Supraclavicular block   Pre-Anesthetic Checklist: ,, timeout performed, Correct Patient, Correct Site, Correct Laterality, Correct Procedure,, site marked, risks and benefits discussed, Surgical consent,  Pre-op evaluation,  At surgeon's request and post-op pain management  Laterality: Left  Prep: chloraprep       Needles:  Injection technique: Single-shot  Needle Type: Echogenic Stimulator Needle     Needle Length: 10cm  Needle Gauge: 21     Additional Needles:   Procedures:,,,, ultrasound used (permanent image in chart),,,,  Narrative:  Start time: 04/24/2017 12:25 PM End time: 04/24/2017 12:35 PM Injection made incrementally with aspirations every 5 mL.  Performed by: Personally  Anesthesiologist: Murvin Natal, MD  Additional Notes: Functioning IV was confirmed and monitors were applied.  A 172mm 21ga [place] echogenic stimulator needle was used. Sterile prep, hand hygiene and sterile gloves were used.  Negative aspiration and negative test dose prior to incremental administration of local anesthetic. The patient tolerated the procedure well.

## 2017-04-24 NOTE — Telephone Encounter (Signed)
-----   Message from Mena Goes, RN sent at 04/24/2017  2:43 PM EST ----- Regarding: additional appt needed from prior message   ----- Message ----- From: Conrad [place], MD Sent: 04/24/2017   2:39 PM To: [facility] [address]  Ronald [name] [id number] May 27, 1922    PROCEDURE:  left upper arm arteriovenous graft  Asst: Leontine Locket, PAC   Follow-up:  1.  2 weeks for staple removal (Nurse visit vs PA clinic) 2.  4 weeks for re-eval of LUA AVG ([facility] Clinic)

## 2017-04-24 NOTE — Anesthesia Preprocedure Evaluation (Addendum)
Anesthesia Evaluation  Patient identified by MRN, date of birth, ID band Patient awake    Reviewed: Allergy & Precautions, NPO status , Patient's Chart, lab work & pertinent test results  Airway Mallampati: II  TM Distance: >3 FB Neck ROM: Full    Dental no notable dental hx.    Pulmonary COPD, Current Smoker,    Pulmonary exam normal breath sounds clear to auscultation       Cardiovascular hypertension, Pt. on medications and Pt. on home beta blockers Normal cardiovascular exam Rhythm:Regular Rate:Normal  ECG: A-fib, LBBB, rate 63  ECHO:  Normal LVEF, normal transaortic gradients across the bioprosthetic aortic valve (23 mm pericardial tissue valve Edwards-SAPIEN 3300 TFX). Moderate pulmonary hypertension.    Neuro/Psych PSYCHIATRIC DISORDERS Depression negative neurological ROS     GI/Hepatic negative GI ROS, Neg liver ROS,   Endo/Other  diabetes, Oral Hypoglycemic Agents, Insulin Dependent  Renal/GU ESRF and DialysisRenal disease     Musculoskeletal negative musculoskeletal ROS (+)   Abdominal   Peds  Hematology  (+) anemia , HLD   Anesthesia Other Findings end stage renal disease  Reproductive/Obstetrics negative OB ROS                            Anesthesia Physical Anesthesia Plan  ASA: IV  Anesthesia Plan: Regional   Post-op Pain Management:    Induction: Intravenous  PONV Risk Score and Plan: 0 and Propofol infusion and Treatment may vary due to age or medical condition  Airway Management Planned: Natural Airway  Additional Equipment:   Intra-op Plan:   Post-operative Plan:   Informed Consent: I have reviewed the patients History and Physical, chart, labs and discussed the procedure including the risks, benefits and alternatives for the proposed anesthesia with the patient or authorized representative who has indicated his/her understanding and acceptance.    Dental advisory given  Plan Discussed with: CRNA  Anesthesia Plan Comments:        Anesthesia Quick Evaluation

## 2017-04-24 NOTE — Progress Notes (Signed)
   Patient stable post op from placement of left arm av graft. Has f/u in office on 3.13 for removal of staples. Please call with questions.   Daneen Volcy C. Donzetta Matters, MD [facility] Office: [phone] Pager: [phone]

## 2017-04-25 ENCOUNTER — Encounter (HOSPITAL_COMMUNITY): Payer: Self-pay | Admitting: Vascular Surgery

## 2017-04-25 LAB — CBC
HCT: 27.1 % — ABNORMAL LOW (ref 39.0–52.0)
HEMOGLOBIN: 8.6 g/dL — AB (ref 13.0–17.0)
MCH: 30.7 pg (ref 26.0–34.0)
MCHC: 31.7 g/dL (ref 30.0–36.0)
MCV: 96.8 fL (ref 78.0–100.0)
PLATELETS: 131 10*3/uL — AB (ref 150–400)
RBC: 2.8 MIL/uL — AB (ref 4.22–5.81)
RDW: 18.4 % — ABNORMAL HIGH (ref 11.5–15.5)
WBC: 7.4 10*3/uL (ref 4.0–10.5)

## 2017-04-25 LAB — RENAL FUNCTION PANEL
ANION GAP: 10 (ref 5–15)
Albumin: 2.4 g/dL — ABNORMAL LOW (ref 3.5–5.0)
BUN: 22 mg/dL — ABNORMAL HIGH (ref 6–20)
CHLORIDE: 101 mmol/L (ref 101–111)
CO2: 25 mmol/L (ref 22–32)
CREATININE: 4.33 mg/dL — AB (ref 0.61–1.24)
Calcium: 8.7 mg/dL — ABNORMAL LOW (ref 8.9–10.3)
GFR, EST AFRICAN AMERICAN: 12 mL/min — AB (ref >60)
GFR, EST NON AFRICAN AMERICAN: 11 mL/min — AB (ref >60)
Glucose, Bld: 128 mg/dL — ABNORMAL HIGH (ref 65–99)
POTASSIUM: 4.7 mmol/L (ref 3.5–5.1)
Phosphorus: 4.6 mg/dL (ref 2.5–4.6)
Sodium: 136 mmol/L (ref 135–145)

## 2017-04-25 LAB — GLUCOSE, CAPILLARY
Glucose-Capillary: 135 mg/dL — ABNORMAL HIGH (ref 65–99)
Glucose-Capillary: 153 mg/dL — ABNORMAL HIGH (ref 65–99)

## 2017-04-25 MED ORDER — LIDOCAINE-PRILOCAINE 2.5-2.5 % EX CREA: 1.0000 "application " | TOPICAL_CREAM | CUTANEOUS | Status: DC | PRN

## 2017-04-25 MED ORDER — DIPHENHYDRAMINE HCL 25 MG PO CAPS: 12.5000 mg | ORAL_CAPSULE | Freq: Four times a day (QID) | ORAL | Status: DC | PRN

## 2017-04-25 MED ORDER — RENA-VITE PO TABS: 1.0000 | ORAL_TABLET | Freq: Every day | ORAL | 3 refills | Status: DC

## 2017-04-25 MED ORDER — DIPHENHYDRAMINE HCL 12.5 MG/5ML PO ELIX: 12.5000 mg | ORAL_SOLUTION | Freq: Four times a day (QID) | ORAL | 1 refills | Status: AC | PRN

## 2017-04-25 MED ORDER — ALTEPLASE 2 MG IJ SOLR
2.0000 mg | Freq: Once | INTRAMUSCULAR | Status: DC | PRN
  Filled 2017-04-25: qty 2

## 2017-04-25 MED ORDER — SODIUM CHLORIDE 0.9 % IV SOLN: 100.0000 mL | INTRAVENOUS | Status: DC | PRN

## 2017-04-25 MED ORDER — TRAMADOL HCL 50 MG PO TABS: 50.0000 mg | ORAL_TABLET | Freq: Two times a day (BID) | ORAL | 0 refills | Status: DC | PRN

## 2017-04-25 MED ORDER — PENTAFLUOROPROP-TETRAFLUOROETH EX AERO: 1.0000 "application " | INHALATION_SPRAY | CUTANEOUS | Status: DC | PRN

## 2017-04-25 MED ORDER — DIPHENHYDRAMINE HCL 12.5 MG/5ML PO ELIX
12.5000 mg | ORAL_SOLUTION | Freq: Four times a day (QID) | ORAL | Status: DC | PRN
  Filled 2017-04-25: qty 10

## 2017-04-25 MED ORDER — DOXAZOSIN MESYLATE 2 MG PO TABS: 2.0000 mg | ORAL_TABLET | Freq: Every day | ORAL | 2 refills | Status: AC

## 2017-04-25 MED ORDER — HEPARIN SODIUM (PORCINE) 1000 UNIT/ML DIALYSIS
1000.0000 [IU] | INTRAMUSCULAR | Status: DC | PRN
  Filled 2017-04-25: qty 1

## 2017-04-25 MED ORDER — GLYBURIDE 5 MG PO TABS: 5.0000 mg | ORAL_TABLET | Freq: Every day | ORAL | 3 refills | Status: DC

## 2017-04-25 MED ORDER — METOPROLOL SUCCINATE ER 25 MG PO TB24: 25.0000 mg | ORAL_TABLET | Freq: Two times a day (BID) | ORAL | 3 refills | Status: DC

## 2017-04-25 MED ORDER — LIDOCAINE HCL (PF) 1 % IJ SOLN
5.0000 mL | INTRAMUSCULAR | Status: DC | PRN
  Filled 2017-04-25: qty 5

## 2017-04-25 NOTE — Progress Notes (Signed)
[facility] will be able to accept patient tomorrow once oxygen is delivered (no staff to receive patient tonight). CSW faxed in [employer] form to [place] LCSW [phone]

## 2017-04-25 NOTE — Progress Notes (Signed)
PT Cancellation Note  Patient Details Name: Ronald [name] MRN: [id number] DOB: 18-Sep-1922   Cancelled Treatment:    Reason Eval/Treat Not Completed: (P) Patient at procedure or test/unavailable(Pt off unit for dialysis will f/u per POC if patient is appropriate.  )   Cristela Blue 04/25/2017, 11:19 AM  [name], PTA pager [phone]

## 2017-04-25 NOTE — Care Management Note (Signed)
Case Management Note  Patient Details  Name: GERRALD BASU MRN: [id number] Date of Birth: Sep 26, 1922  Subjective/Objective:   Admitted for acute on chronic diastolic CHF, acute GIB, AKI, elevated INR.                 Delora Fuel (Son) Teejay Meader (Daughter)  [name]      [phone]  [phone]                           PCP: [name]  Action/Plan: Transition back to ALF with home health services to follow and outpatient HD [facility] MWF pm). CSW managing  disposition back to facility.      Expected Discharge Date:  04/25/17               Expected Discharge Plan:  Assisted Living / Rest Home  In-House Referral:  Clinical Social Work  Discharge planning Services  CM Consult  Post Acute Care Choice:    Choice offered to:     DME Arranged:   oxygen DME Agency:   [facility]  HH Arranged:  RN, Nurse's Aide, Social Work, PT,OT [place]:  [employer] (now [facility])  Status of Service:  Completed, signed off  If discussed at [employer], dates discussed:    Additional Comments:  Sharin Mons, RN 04/25/2017, 12:24 PM

## 2017-04-25 NOTE — Procedures (Signed)
I was present at this session.  I have reviewed the session itself and made appropriate changes.  H D via [facility] . BPs 110-120s. tol well.   Jeneen Rinks Karol Skarzynski 3/1/20198:41 AM

## 2017-04-25 NOTE — Progress Notes (Signed)
Subjective: Interval History: has complaints itching.  Objective: Vital signs in last 24 hours: Temp:  [97.2 F (36.2 C)-98.6 F (37 C)] 98.3 F (36.8 C) (03/01 0717) Pulse Rate:  [53-93] 80 (03/01 0800) Resp:  [16-22] 16 (03/01 0602) BP: (116-184)/(40-75) 116/45 (03/01 0800) SpO2:  [93 %-100 %] 97 % (03/01 0602) FiO2 (%):  [99 %] 99 % (02/28 0855) Weight:  [71.1 kg (156 lb 12 oz)-71.5 kg (157 lb 10.1 oz)] 71.5 kg (157 lb 10.1 oz) (03/01 0717) Weight change: -1.1 kg (-6.8 oz)  Intake/Output from previous day: 02/28 0701 - 03/01 0700 In: 300 [I.V.:300] Out: 100 [Urine:90; Blood:10] Intake/Output this shift: No intake/output data recorded.  General appearance: cooperative, no distress and HOH Resp: diminished breath sounds bilaterally Chest wall: [facility] PC Cardio: S1, S2 normal and systolic murmur: systolic ejection 3/6, decrescendo at 2nd left intercostal space GI: soft, non-tender; bowel sounds normal; no masses,  no organomegaly Extremities: new aVG LUA B&T  Lab Results: Recent Labs    04/23/17 0646  WBC 7.6  HGB 8.9*  HCT 27.5*  PLT 118*   BMET:  Recent Labs    04/23/17 0646  NA 136  K 3.2*  CL 98*  CO2 27  GLUCOSE 136*  BUN 28*  CREATININE 4.23*  CALCIUM 8.7*   No results for input(s): PTH in the last 72 hours. Iron Studies: No results for input(s): IRON, TIBC, TRANSFERRIN, FERRITIN in the last 72 hours.  Studies/Results: No results found.  I have reviewed the patient's current medications.  Assessment/Plan: 1 ESRD  For HD.  Has access. CAn d/c , has spot at [facility] MWF pm 2 Anemia esa/Fe 3 HPTH 4 AVR 5 HOH P HD, can d/c home from our standpoint, Benadryl    LOS: 15 days   Jeneen Rinks Donell Sliwinski 04/25/2017,8:44 AM

## 2017-04-25 NOTE — Progress Notes (Addendum)
Physical Therapy Treatment Patient Details Name: Ronald [name] MRN: [id number] DOB: 05-20-1922 Today's Date: 04/25/2017    History of Present Illness Pt is a 84 y.o. male who comes in with increasing shortness of breath, Hx significant for peripheral vascular disease status post vent placement, renal artery stenosis that is post angioplasty with stent and rotational atherectomy in 5329, diastolic heart failure (last echo 03/12/2017), stage III sent chronic kidney disease, hypertension, paroxysmal atrial fibrillation on warfarin, COPD, aortic stenosis status post bovine AV valve replacement in 01/07/2011, hyperlipidemia.    PT Comments    Pt performed gait and transfer training with decreased assistance and increased activity tolerance.  Unable to obtain pulse ox reading during gait training due to poor waveform.  Pt does present with SHOB during gait training.  Plan is to return to ALF at d/c.  Pt will require a 3:1 and RW at d/c.  Pt left sitting in his bed in the chair position with alarm activated.     Follow Up Recommendations  Home health PT;Other (comment)(ALF)     Equipment Recommendations  Rolling walker with 5" wheels;3in1 (PT)    Recommendations for Other Services       Precautions / Restrictions Precautions Precautions: Fall Precaution Comments: watch for lightheadedness Restrictions Weight Bearing Restrictions: No    Mobility  Bed Mobility Overal bed mobility: Needs Assistance Bed Mobility: Supine to Sit     Supine to sit: Min assist     General bed mobility comments: Pt performed with cues for hand placement, min assist to elevate trunk.  pt reports feeling like his scrotum was being sat on so returned to supine x2 to reposition.  Pt required min assistance each time to return to sitting.    Transfers   Equipment used: Rolling walker (2 wheeled) Transfers: Sit to/from Stand   Stand pivot transfers: Mod assist;+2 safety/equipment       General transfer  comment: Mos assistance to power into standing.  Pt performed from bed and from recliner chair.  Pt reported dizziness pre transfer. BP soft pre transfer at 109/42, no complaints of dizziness in standing.    Ambulation/Gait Ambulation/Gait assistance: Min assist Ambulation Distance (Feet): 12 Feet(+ 40 ft) Assistive device: Rolling walker (2 wheeled) Gait Pattern/deviations: Step-through pattern;Decreased dorsiflexion - right;Shuffle;Trunk flexed Gait velocity: slowed Gait velocity interpretation: Below normal speed for age/gender General Gait Details: slight circumduction of R hip and almost interferring with the RW.  Cues to step closer to RW to prevent hooking R foot. Pt fatigues quickly and required close chair follow for safety.     Stairs            Wheelchair Mobility    Modified Rankin (Stroke Patients Only)       Balance Overall balance assessment: Needs assistance   Sitting balance-Leahy Scale: Fair       Standing balance-Leahy Scale: Poor Standing balance comment: requires UE support                            Cognition Arousal/Alertness: Awake/alert Behavior During Therapy: WFL for tasks assessed/performed Overall Cognitive Status: Difficult to assess                                        Exercises      General Comments        Pertinent Vitals/Pain Pain  Assessment: No/denies pain    Home Living                      Prior Function            PT Goals (current goals can now be found in the care plan section) Acute Rehab PT Goals Patient Stated Goal: wants to get home Potential to Achieve Goals: Good Progress towards PT goals: Progressing toward goals    Frequency    Min 3X/week      PT Plan Current plan remains appropriate    Co-evaluation              AM-PAC PT "6 Clicks" Daily Activity  Outcome Measure  Difficulty turning over in bed (including adjusting bedclothes, sheets and  blankets)?: Unable Difficulty moving from lying on back to sitting on the side of the bed? : Unable Difficulty sitting down on and standing up from a chair with arms (e.g., wheelchair, bedside commode, etc,.)?: Unable Help needed moving to and from a bed to chair (including a wheelchair)?: A Lot Help needed walking in hospital room?: A Lot Help needed climbing 3-5 steps with a railing? : Total 6 Click Score: 8    End of Session Equipment Utilized During Treatment: Gait belt Activity Tolerance: Patient limited by fatigue Patient left: with chair alarm set;in bed;with bed alarm set(with bed placed in chair position) Nurse Communication: Mobility status PT Visit Diagnosis: Muscle weakness (generalized) (M62.81);Difficulty in walking, not elsewhere classified (R26.2)     Time: 5643-3295 PT Time Calculation (min) (ACUTE ONLY): 27 min  Charges:  $Gait Training: 8-22 mins $Therapeutic Activity: 8-22 mins                    G Codes:       [name], PTA pager [phone]    Cristela Blue 04/25/2017, 3:13 PM

## 2017-04-25 NOTE — Plan of Care (Signed)
  Progressing Health Behavior/Discharge Planning: Ability to manage health-related needs will improve 04/25/2017 0150 - Progressing by Aris Lot, RN Activity: Risk for activity intolerance will decrease 04/25/2017 0150 - Progressing by Aris Lot, RN Education: Ability to demonstrate management of disease process will improve 04/25/2017 0150 - Progressing by Aris Lot, RN Ability to verbalize understanding of medication therapies will improve 04/25/2017 0150 - Progressing by Aris Lot, RN Activity: Capacity to carry out activities will improve 04/25/2017 0150 - Progressing by Aris Lot, RN Cardiac: Ability to achieve and maintain adequate cardiopulmonary perfusion will improve 04/25/2017 0150 - Progressing by Aris Lot, RN

## 2017-04-25 NOTE — NC FL2 (Signed)
[place] MEDICAID FL2 LEVEL OF CARE SCREENING TOOL     IDENTIFICATION  Patient Name: Ronald [name] Birthdate: 05-24-1922 Sex: male Admission Date (Current Location): 04/10/2017  [facility] and [place] Number:  Herbalist and Address:  [facility] [place], [place] [address], [place], [place] [address]      Provider Number: [id number]  Attending Physician Name and Address:  Mendel Corning, MD  Relative Name and Phone Number:  [place], daughter, [phone]    Current Level of Care: Hospital Recommended Level of Care: [place] Prior Approval Number:    Date Approved/Denied:   PASRR Number:    Discharge Plan: Other (Comment)(ALF)    Current Diagnoses: Patient Active Problem List   Diagnosis Date Noted  . Acute renal failure superimposed on stage 3 chronic kidney disease ([place])   . Goals of care, counseling/discussion   . DNR (do not resuscitate) discussion   . Palliative care encounter   . Acute respiratory distress 03/10/2017  . Skin excoriation 03/10/2017  . Closed fracture of left proximal humerus 12/26/2016  . Comminuted left humeral fracture 12/23/2016  . GI bleed 07/02/2016  . Lactic acidemia 07/02/2016  . Lower GI bleed   . Tobacco use 03/07/2015  . Diabetes mellitus type 2, uncontrolled ([place]) 12/20/2013  . Acute blood loss anemia 12/20/2013  . Allergic rhinitis 12/20/2013  . Intertrochanteric fracture of left femur ([place]) 12/12/2013  . Anemia 12/12/2013  . Right acetabular fracture ([place]) 04/11/2013  . Acetabular fracture ([name]) 04/11/2013  . Bilateral claudication of lower limb ([place]) 12/24/2012  . Cellulitis 12/02/2012  . History of tobacco abuse- 75 years, quit in Feb 2014 07/06/2012  . Acute on chronic diastolic (congestive) heart failure ([place]) 07/06/2012  . COPD (chronic obstructive pulmonary disease) ([place]) 05/27/2012  . Pulmonary nodules 05/27/2012  . Normal coronary arteries, cath 11/12 07/25/2011  . Normal left  ventricular systolic function, Echo 5/05 07/25/2011  . Closed right hip fracture, ORIF 05/04/74 complicated by gluteal hematoma while being Coumadinized post op 06/27/2011  . Aspirin allergy, rash, SOB. Pt is on Plavix 06/19/2011  . Chronic atrial fibrillation ([place]) 06/19/2011  . Thrombocytopenia ([place]) 01/11/2011  . Leukocytosis 01/11/2011  . S/P aortic valve replacement: #23 Magna Ease Edwards Pericardial Valve  November 2012 01/08/2011  . AS (aortic stenosis)- s/p tissue AVR 01/07/11   . CAROTID BRUIT- moderate ICA disease 5/14 05/31/2008  . Diabetes mellitus with complication ([place]) 73/41/9379  . Hyperlipidemia 07/11/2007  . DEPRESSION 07/11/2007  . RESTLESS LEGS SYNDROME 07/11/2007  . Essential hypertension 07/11/2007  . PVD, Rt SFA PTA/HSRA 06/17/11 07/11/2007  . DIVERTICULOSIS, COLON 07/11/2007  . RENAL CYST 07/11/2007  . ARTHRITIS 07/11/2007  . SKIN CANCER, HX OF 07/11/2007  . RHEUMATIC HEART DISEASE, HX OF 07/11/2007    Orientation RESPIRATION BLADDER Height & Weight     Self, Time, Situation, Place  O2(Nasal cannula 2L) Continent Weight: 69.5 kg (153 lb 3.5 oz) Height:  5\' 11"  (180.3 cm)  BEHAVIORAL SYMPTOMS/MOOD NEUROLOGICAL BOWEL NUTRITION STATUS      Continent Diet(Regular)  AMBULATORY STATUS COMMUNICATION OF NEEDS Skin   Limited Assist Verbally Normal                       Personal Care Assistance Level of Assistance  Bathing, Feeding, Dressing Bathing Assistance: Limited assistance Feeding assistance: Independent Dressing Assistance: Limited assistance     Functional Limitations Info  Hearing   Hearing Info: Impaired  SPECIAL CARE FACTORS FREQUENCY  PT (By licensed PT)     PT Frequency: home health PT 3x/week              Contractures      Additional Factors Info  Code Status, Allergies, Psychotropic, Insulin Sliding Scale Code Status Info: DNR Allergies Info: Aspirin Psychotropic Info: Wellbutrin Insulin Sliding Scale Info: 3x daily  with meals and at bedtime       Current Medications (04/25/2017):   Discharge Medications: STOP taking these medications           alendronate 70 MG tablet Commonly known as:  FOSAMAX    benazepril 20 MG tablet Commonly known as:  LOTENSIN    furosemide 40 MG tablet Commonly known as:  LASIX    Insulin Glargine 100 UNIT/ML Solostar Pen Commonly known as:  LANTUS    polyethylene glycol packet Commonly known as:  MIRALAX / GLYCOLAX    potassium chloride SA 20 MEQ tablet Commonly known as:  K-DUR,KLOR-CON               TAKE these medications            acetaminophen 325 MG tablet Commonly known as:  TYLENOL Take 650 mg by mouth every 6 (six) hours as needed for mild pain.    albuterol 0.63 MG/3ML nebulizer solution Commonly known as:  ACCUNEB Take 3 mLs by nebulization every 6 (six) hours as needed for wheezing or shortness of breath.    ANORO ELLIPTA 62.5-25 MCG/INH Aepb Generic drug:  umeclidinium-vilanterol Inhale 1 puff into the lungs daily.    buPROPion 150 MG 24 hr tablet Commonly known as:  WELLBUTRIN XL Take 150 mg by mouth daily.    diphenhydrAMINE 12.5 MG/5ML elixir Commonly known as:  BENADRYL Take 5 mLs (12.5 mg total) by mouth every 6 (six) hours as needed for itching. Over the counter    doxazosin 2 MG tablet Commonly known as:  CARDURA Take 1 tablet (2 mg total) by mouth at bedtime. What changed:    medication strength  how much to take  when to take this    ferrous sulfate 325 (65 FE) MG EC tablet Take 1 tablet (325 mg total) by mouth 2 (two) times daily.    finasteride 5 MG tablet Commonly known as:  PROSCAR Take 5 mg by mouth at bedtime.    fluticasone 50 MCG/ACT nasal spray Commonly known as:  FLONASE Place 1 spray into both nostrils 2 (two) times daily.    glyBURIDE 5 MG tablet Commonly known as:  DIABETA Take 1 tablet (5 mg total) by mouth daily with breakfast. What changed:    how much to take  when to  take this  additional instructions    loratadine 10 MG tablet Commonly known as:  CLARITIN Take 10 mg by mouth daily.    Melatonin 5 MG Tabs Take 5 mg by mouth at bedtime.    metoprolol succinate 25 MG 24 hr tablet Commonly known as:  TOPROL-XL Take 1 tablet (25 mg total) by mouth 2 (two) times daily with a meal. What changed:    medication strength  how much to take  when to take this  additional instructions    multivitamin Tabs tablet Take 1 tablet by mouth at bedtime.    simvastatin 20 MG tablet Commonly known as:  ZOCOR Take 20 mg by mouth every evening.    traMADol 50 MG tablet Commonly known as:  ULTRAM Take 1 tablet (50 mg  total) by mouth every 12 (twelve) hours as needed for moderate pain.    warfarin 2 MG tablet Commonly known as:  COUMADIN Take 1-2 mg by mouth See admin instructions. 1mg  by mouth once daily in the evening on Tuesday and Thursday. 2mg  by mouth once daily in the evening on Monday, Wednesday, Friday, Saturday, Sunday       Relevant Imaging Results:  Relevant Lab Results:   Additional Information SS#: [id number]       Set up with outpatient hemodialysis at [facility] MWF [place] Irie Fiorello, LCSWA

## 2017-04-25 NOTE — Discharge Summary (Addendum)
Physician Discharge Summary   [name] MRN: [id number] DOB/AGE: March 31, 1922 84 y.o.  Admit date: 04/10/2017 Discharge date: 04/25/2017  Primary Care Physician:  [name], MD   Recommendations for Outpatient Follow-up:  1. Follow up with PCP in 1-2 weeks 2. Please obtain BMP/CBC in one week 3. He has dialysis spot at [facility] MWF pm  Home Health: Home health PT, OT, RN Equipment/Devices: DME 3 and 1, rolling walker  Discharge Condition: Stable CODE STATUS:  DNR   Diet recommendation: Carb modified diet   Discharge Diagnoses:    . Acute on chronic diastolic (congestive) heart failure ([place]) . Acute blood loss anemia . (Resolved) AKI (acute kidney injury) ([place]) . Chronic atrial fibrillation ([place]) . (Resolved) Chronic renal insufficiency, stage IV (severe) ([place]) . COPD (chronic obstructive pulmonary disease) ([place]) . Diabetes mellitus type 2, uncontrolled ([place]) . Essential hypertension . GI bleed . PVD, Rt SFA PTA/HSRA 06/17/11 . Thrombocytopenia ([place])   Consults:  Nephrology Vascular surgery Palliative care   Allergies:   Allergies  Allergen Reactions  . Aspirin Anaphylaxis, Shortness Of Breath and Rash    "broke out in white welts; red blotches neck and face; windpipe closing; ~ 1962"     DISCHARGE MEDICATIONS: Allergies as of 04/25/2017      Reactions   Aspirin Anaphylaxis, Shortness Of Breath, Rash   "broke out in white welts; red blotches neck and face; windpipe closing; ~ 1962"      Medication List    STOP taking these medications   alendronate 70 MG tablet Commonly known as:  FOSAMAX   benazepril 20 MG tablet Commonly known as:  LOTENSIN   furosemide 40 MG tablet Commonly known as:  LASIX   Insulin Glargine 100 UNIT/ML Solostar Pen Commonly known as:  LANTUS   polyethylene glycol packet Commonly known as:  MIRALAX / GLYCOLAX   potassium chloride SA 20 MEQ tablet Commonly known as:  K-DUR,KLOR-CON     TAKE these medications    acetaminophen 325 MG tablet Commonly known as:  TYLENOL Take 650 mg by mouth every 6 (six) hours as needed for mild pain.   albuterol 0.63 MG/3ML nebulizer solution Commonly known as:  ACCUNEB Take 3 mLs by nebulization every 6 (six) hours as needed for wheezing or shortness of breath.   ANORO ELLIPTA 62.5-25 MCG/INH Aepb Generic drug:  umeclidinium-vilanterol Inhale 1 puff into the lungs daily.   buPROPion 150 MG 24 hr tablet Commonly known as:  WELLBUTRIN XL Take 150 mg by mouth daily.   diphenhydrAMINE 12.5 MG/5ML elixir Commonly known as:  BENADRYL Take 5 mLs (12.5 mg total) by mouth every 6 (six) hours as needed for itching. Over the counter   doxazosin 2 MG tablet Commonly known as:  CARDURA Take 1 tablet (2 mg total) by mouth at bedtime. What changed:    medication strength  how much to take  when to take this   ferrous sulfate 325 (65 FE) MG EC tablet Take 1 tablet (325 mg total) by mouth 2 (two) times daily.   finasteride 5 MG tablet Commonly known as:  PROSCAR Take 5 mg by mouth at bedtime.   fluticasone 50 MCG/ACT nasal spray Commonly known as:  FLONASE Place 1 spray into both nostrils 2 (two) times daily.   glyBURIDE 5 MG tablet Commonly known as:  DIABETA Take 1 tablet (5 mg total) by mouth daily with breakfast. What changed:    how much to take  when to take this  additional  instructions   loratadine 10 MG tablet Commonly known as:  CLARITIN Take 10 mg by mouth daily.   Melatonin 5 MG Tabs Take 5 mg by mouth at bedtime.   metoprolol succinate 25 MG 24 hr tablet Commonly known as:  TOPROL-XL Take 1 tablet (25 mg total) by mouth 2 (two) times daily with a meal. What changed:    medication strength  how much to take  when to take this  additional instructions   multivitamin Tabs tablet Take 1 tablet by mouth at bedtime.   simvastatin 20 MG tablet Commonly known as:  ZOCOR Take 20 mg by mouth every evening.   traMADol 50 MG  tablet Commonly known as:  ULTRAM Take 1 tablet (50 mg total) by mouth every 12 (twelve) hours as needed for moderate pain.   warfarin 2 MG tablet Commonly known as:  COUMADIN Take 1-2 mg by mouth See admin instructions. 28m by mouth once daily in the evening on Tuesday and Thursday. 257mby mouth once daily in the evening on Monday, Wednesday, Friday, Saturday, _0 /01/19 1200   04/25/17 1201  For home use only DME Walker rolling  Once    Question:  Patient needs a walker to treat with the following condition  Answer:  Gait instability   04/25/17 1200       Brief H and P: For complete details please refer to admission H and P, but in brief 89yom presented with SOB, tarry stools. Admitted for acute on chronic diastolic CHF, acute GIB, AKI, elevated INR on warfarin. Seen by GI but Hgb remained stable. Warfarin stopped. GI recommended no intervention and signed off. Seen by nephrology with initial rec for conservative care, no HD. Subsequently patient opted [facility] HD [facility] HD started 2/21   Hospital Course:  [place]ute renal failure superimposed on stage 3 [place] kidney disease [place], started on [place] -Patient had presented with increasing shortness of breath, progressive worsening of kidney function. At this point, suspected unknown GN. - Nephrology was consulted and initially it was felt that he may not be a dialysis candidate. -   palliative medicine was consulted. Patient wanted to pursue all the treatments and hence nephrology [facility] TDC was placed.  - Patient started hemodialysis on 04/17/17 - GN workup was done, negative ANCA, anti-GBM, C3-C4 ANA hepatitis B and C. SPEP showed hypoalbuminemia but no evidence of monoclonal protein -  Renal ultrasound showed no obstruction or hydronephrosis. - Vascular surgery was consulted, patient underwent permanent access placement on 2/28.  Acute on chronic diastolic CHF - 2-D echo 02/29/72/25howed normal EF, moderate pulmonary hypertension, not sufficient to allow evaluation of LV diastolic dysfunction.  - Chest x-ray 2/17 had shown a stable cardiomegaly with persistent moderate interstitial edema.  -Volume management with hemodialysis -Continue Toprol-XL  GI bleed, coagulopathy on admission, high INR - patient noted  "dark stools for a while, poor historian. GI was consulted, seen by Dr. GeCarlean Purlrecommended no plans for EGD at this time and continue to monitor conservatively.  -Continue Protonix -H&H currently stable, monitor closely  Acute blood loss anemia superimposed on anemia of chronic  disease -H&H stable  Chronic atrial fibrillation -Rate controlled, continue Toprol-XL - Restart warfarin,   Diabetes mellitus type II - Discontinued Lantus, decreased glipizide to 5 mg daily. Further adjustments of the regimen per outpatient PCP   Bioprosthetic AVR  COPD -Currently stable  Essential hypertension  - continue metoprolol, doxazosin decreased to 2 mg daily  Osteoporosis - Discontinued alendronate is GI noted potentially ulcerogenic    Day of Discharge S: Complaining of some itching otherwise no acute issues per patient  BP (!) 113/56   Pulse 98   Temp 97.8 F (36.6 C) (Oral)   Resp 16   Ht 5' 11" (1.803 m)   Wt 69.5 kg (153 lb 3.5 oz)   SpO2 98%   BMI 21.37 kg/m   Physical Exam: General: Alert and awake , hard of hearing, NAD HEENT: anicteric sclera, pupils reactive to light and accommodation CVS: S1-S2 clear no murmur rubs or gallops Chest: clear to auscultation bilaterally, no wheezing rales or rhonchi Abdomen: soft nontender, nondistended, normal bowel sounds Extremities: no cyanosis, clubbing or edema noted bilaterally Neuro: no neuro  focal deficits    The results of significant diagnostics from this hospitalization (including imaging, microbiology, ancillary and laboratory) are listed below for reference.      Procedures/Studies:  2-D echo  Study Conclusions  - Left ventricle: The cavity size was normal. There was mild   concentric hypertrophy. Systolic function was normal. The   estimated ejection fraction was in the range of 60% to 65%. Wall   motion was normal; there were no regional wall motion   abnormalities. The study was not technically sufficient to allow   evaluation of LV diastolic dysfunction due to atrial   fibrillation. - Aortic valve: A bioprosthetic valve sits well in the aortic   position. Transaortic gradients were normal. There was no   paravalvular leak or central aortic insufficiency. Valve area   (VTI): 1.4 cm^2. Valve area (Vmax): 1.31 cm^2. Valve area   (Vmean): 1.42 cm^2. - Mitral valve: Calcified annulus. Moderately thickened, moderately   calcified leaflets . There was mild regurgitation. - Left atrium: The atrium was moderately dilated. - Right atrium: The atrium was mildly dilated. - Tricuspid valve: There was moderate regurgitation. - Pulmonary arteries: Systolic pressure was moderately increased.   PA peak pressure: 49 mm Hg (S). - Inferior vena cava: The vessel was normal in size.   Dg Chest 2 View  Result Date: 04/10/2017 CLINICAL DATA:  Shortness of breath. EXAM: CHEST  2 VIEW COMPARISON:  Radiograph 03/10/2017 FINDINGS: Post median sternotomy with prosthetic aortic valve. Prior left atrial clipping. Unchanged cardiomegaly. Progressive pulmonary edema from prior exam. Increased bilateral pleural effusions. No pneumothorax. Left shoulder surgical hardware is partially included. IMPRESSION: Progressive pulmonary edema and increased bilateral pleural effusions consistent with worsening CHF. Electronically Signed   By: Jeb Levering M.D.   On: 04/10/2017 03:21   US  Renal  Result Date: 04/12/2017 CLINICAL DATA:  Acute kidney injury EXAM: RENAL / URINARY TRACT ULTRASOUND COMPLETE COMPARISON:  CT abdomen pelvis 06/29/2011 FINDINGS: Right Kidney: Length: 11.2 cm. Cortical thinning diffusely with increased echogenicity of the cortex. Right upper pole cyst 14 mm. Negative for hydronephrosis or mass. Left Kidney: Length: 11.2 cm. Mild increased echogenicity of the cortex. Left upper pole cyst 2.4 cm. Negative for mass or hydronephrosis. Bladder: Appears normal for degree of bladder distention. IMPRESSION: Increased echogenicity of the renal cortex bilaterally. Cortical thinning noted in the right kidney. Bilateral renal cysts. Negative for hydronephrosis.  Electronically Signed   By: Franchot Gallo M.D.   On: 04/12/2017 13:04   Ir Fluoro Guide Cv Line Left  Result Date: 04/17/2017 INDICATION: 84 year old male with acute CHF and acute exacerbation of chronic kidney disease now requiring hemodialysis. He presents for placement of a tunneled hemodialysis catheter. He has a vesicular rash overlying his right chest that appears to represent herpes zoster. Therefore, we will proceed with placement of a left-sided HD catheter. EXAM: TUNNELED CENTRAL VENOUS HEMODIALYSIS CATHETER PLACEMENT WITH ULTRASOUND AND FLUOROSCOPIC GUIDANCE MEDICATIONS: 2 g Ancef. The antibiotic was given in an appropriate time interval prior to skin puncture. ANESTHESIA/SEDATION: 25 mg mcg fentanyl administered intravenously for pain control. This does not constitute conscious sedation. FLUOROSCOPY TIME:  Fluoroscopy Time: 1 minutes 0 seconds (3 mGy). COMPLICATIONS: None immediate. PROCEDURE: Informed written consent was obtained from the patient after a discussion of the risks, benefits, and alternatives to treatment. Questions regarding the procedure were encouraged and answered. The left neck and chest were prepped with chlorhexidine in a sterile fashion, and a sterile drape was applied covering the operative  field. Maximum barrier sterile technique with sterile gowns and gloves were used for the procedure. A timeout was performed prior to the initiation of the procedure. After creating a small venotomy incision, a micropuncture kit was utilized to access the left internal jugular vein under direct, real-time ultrasound guidance after the overlying soft tissues were anesthetized with 1% lidocaine with epinephrine. Ultrasound image documentation was performed. The microwire was kinked to measure appropriate catheter length. A stiff Glidewire was advanced to the level of the IVC and the micropuncture sheath was exchanged for a peel-away sheath. A palindrome tunneled hemodialysis catheter measuring 28 cm from tip to cuff was tunneled in a retrograde fashion from the anterior chest wall to the venotomy incision. The catheter was then placed through the peel-away sheath with tips ultimately positioned within the superior aspect of the right atrium. Final catheter positioning was confirmed and documented with a spot radiographic image. The catheter aspirates and flushes normally. The catheter was flushed with appropriate volume heparin dwells. The catheter exit site was secured with a 0-Prolene retention suture. The venotomy incision was closed with Dermabond. Dressings were applied. The patient tolerated the procedure well without immediate post procedural complication. IMPRESSION: Successful placement of 28 cm tip to cuff tunneled hemodialysis catheter via the left internal jugular vein with tips terminating within the superior aspect of the right atrium. The catheter is ready for immediate use. Electronically Signed   By: Jacqulynn Cadet M.D.   On: 04/17/2017 10:42   Ir US Guide Vasc Access Left  Result Date: 04/17/2017 INDICATION: 84 year old male with acute CHF and acute exacerbation of chronic kidney disease now requiring hemodialysis. He presents for placement of a tunneled hemodialysis catheter. He has a  vesicular rash overlying his right chest that appears to represent herpes zoster. Therefore, we will proceed with placement of a left-sided HD catheter. EXAM: TUNNELED CENTRAL VENOUS HEMODIALYSIS CATHETER PLACEMENT WITH ULTRASOUND AND FLUOROSCOPIC GUIDANCE MEDICATIONS: 2 g Ancef. The antibiotic was given in an appropriate time interval prior to skin puncture. ANESTHESIA/SEDATION: 25 mg mcg fentanyl administered intravenously for pain control. This does not constitute conscious sedation. FLUOROSCOPY TIME:  Fluoroscopy Time: 1 minutes 0 seconds (3 mGy). COMPLICATIONS: None immediate. PROCEDURE: Informed written consent was obtained from the patient after a discussion of the risks, benefits, and alternatives to treatment. Questions regarding the procedure were encouraged and answered. The left neck and chest were prepped with chlorhexidine  in a sterile fashion, and a sterile drape was applied covering the operative field. Maximum barrier sterile technique with sterile gowns and gloves were used for the procedure. A timeout was performed prior to the initiation of the procedure. After creating a small venotomy incision, a micropuncture kit was utilized to access the left internal jugular vein under direct, real-time ultrasound guidance after the overlying soft tissues were anesthetized with 1% lidocaine with epinephrine. Ultrasound image documentation was performed. The microwire was kinked to measure appropriate catheter length. A stiff Glidewire was advanced to the level of the IVC and the micropuncture sheath was exchanged for a peel-away sheath. A palindrome tunneled hemodialysis catheter measuring 28 cm from tip to cuff was tunneled in a retrograde fashion from the anterior chest wall to the venotomy incision. The catheter was then placed through the peel-away sheath with tips ultimately positioned within the superior aspect of the right atrium. Final catheter positioning was confirmed and documented with a spot  radiographic image. The catheter aspirates and flushes normally. The catheter was flushed with appropriate volume heparin dwells. The catheter exit site was secured with a 0-Prolene retention suture. The venotomy incision was closed with Dermabond. Dressings were applied. The patient tolerated the procedure well without immediate post procedural complication. IMPRESSION: Successful placement of 28 cm tip to cuff tunneled hemodialysis catheter via the left internal jugular vein with tips terminating within the superior aspect of the right atrium. The catheter is ready for immediate use. Electronically Signed   By: Jacqulynn Cadet M.D.   On: 04/17/2017 10:42   Dg Chest Port 1 View  Result Date: 04/19/2017 CLINICAL DATA:  Pulmonary edema EXAM: PORTABLE CHEST 1 VIEW COMPARISON:  04/13/2017 FINDINGS: Left jugular dialysis catheter has been placed with its tip at the cavoatrial junction. Atrial appendage stable remains in place. Hazy hazy airspace disease compatible with pulmonary edema is stable on the left and worse in the right lung. Small right pleural effusion is not excluded. IMPRESSION: Bilateral hazy pulmonary edema is stable on the left and worsening on the right. Small right pleural effusion is not excluded. Electronically Signed   By: Marybelle Killings M.D.   On: 04/19/2017 13:40   Dg Chest Port 1 View  Result Date: 04/13/2017 CLINICAL DATA:  Hypoxia. EXAM: PORTABLE CHEST 1 VIEW COMPARISON:  04/12/2017 FINDINGS: Lungs are adequately inflated and demonstrate continued hazy perihilar bibasilar opacification likely persistent moderate interstitial edema and possible small bilateral pleural effusions. Stable cardiomegaly. Remainder the exam is unchanged. IMPRESSION: Stable cardiomegaly and persistent moderate interstitial edema. Likely small amount of bilateral pleural fluid. Electronically Signed   By: Marin Olp M.D.   On: 04/13/2017 08:05   Dg Chest Port 1 View  Result Date: 04/12/2017 CLINICAL DATA:   Hypoxia EXAM: PORTABLE CHEST 1 VIEW COMPARISON:  04/11/2017 FINDINGS: Diffuse bilateral airspace disease with mild interval progression. Bilateral pleural effusions also with mild progression. Cardiac enlargement. Aortic valve replacement. Left atrial clip. Bibasilar atelectasis. IMPRESSION: Progression of congestive heart failure. Worsening edema and bilateral effusions. Electronically Signed   By: Franchot Gallo M.D.   On: 04/12/2017 06:37   Dg Chest Port 1 View  Result Date: 04/11/2017 CLINICAL DATA:  Shortness of Breath EXAM: PORTABLE CHEST 1 VIEW COMPARISON:  April 10, 2017 FINDINGS: There is cardiomegaly with pulmonary venous hypertension. There are pleural effusions bilaterally with bibasilar atelectasis. No airspace consolidation or pulmonary edema evident. There is a prosthetic aortic valve present. There is a left atrial appendage clamp. No adenopathy. There is postoperative  total shoulder replacement on the left. IMPRESSION: Pulmonary vascular congestion with bibasilar atelectasis and small pleural effusions. No frank edema or consolidation evident. Areas of postoperative change noted. Electronically Signed   By: Lowella Grip III M.D.   On: 04/11/2017 11:19      LAB RESULTS: Basic Metabolic Panel: Recent Labs  Lab 04/23/17 0646 04/25/17 0800  NA 136 136  K 3.2* 4.7  CL 98* 101  CO2 27 25  GLUCOSE 136* 128*  BUN 28* 22*  CREATININE 4.23* 4.33*  CALCIUM 8.7* 8.7*  PHOS 3.9 4.6   Liver Function Tests: Recent Labs  Lab 04/23/17 0646 04/25/17 0800  ALBUMIN 2.5* 2.4*   No results for input(s): LIPASE, AMYLASE in the last 168 hours. No results for input(s): AMMONIA in the last 168 hours. CBC: Recent Labs  Lab 04/23/17 0646 04/25/17 0822  WBC 7.6 7.4  HGB 8.9* 8.6*  HCT 27.5* 27.1*  MCV 94.5 96.8  PLT 118* 131*   Cardiac Enzymes: No results for input(s): CKTOTAL, CKMB, CKMBINDEX, TROPONINI in the last 168 hours. BNP: Invalid input(s): POCBNP CBG: Recent  Labs  Lab 04/24/17 1544 04/24/17 2138  GLUCAP 114* 197*      Disposition and Follow-up: Discharge Instructions    Diet Carb Modified   Complete by:  As directed    Increase activity slowly   Complete by:  As directed        DISPOSITION: Assisted living facility  [place]    Conrad Reno, MD Follow up in 2 week(s).   Specialties:  Vascular Surgery, Cardiology Why:  Office will call you to arrange your appt (sent) Contact information: [place] [address] [phone]            Time coordinating discharge:  45 minutes  Signed:   Estill Cotta M.D. [facility] 04/25/2017, 12:11 PM Pager: [phone]   Addendum Patient was not able to be discharged on 3/1 as oxygen was not delivered to the facility.     Estill Cotta M.D. [facility] 04/26/2017, 12:13 PM  Pager: [phone]

## 2017-04-25 NOTE — Progress Notes (Addendum)
SATURATION QUALIFICATIONS: (This note is used to comply with regulatory documentation for home oxygen)  Patient Saturations on Room Air at Rest = 95%  Please briefly explain why patient needs home oxygen:  Pt desatted to 88% with movement. Applied 2L O2 pt rebounded to 95%.

## 2017-04-25 NOTE — Anesthesia Postprocedure Evaluation (Signed)
Anesthesia Post Note  Patient: Ronald [name]  Procedure(s) Performed: INSERTION OF ARTERIOVENOUS (AV) GORE-TEX GRAFT ARM (Left Arm Upper)     Patient location during evaluation: PACU Anesthesia Type: Regional Level of consciousness: awake Pain management: pain level controlled Vital Signs Assessment: post-procedure vital signs reviewed and stable Respiratory status: spontaneous breathing, nonlabored ventilation, respiratory function stable and patient connected to nasal cannula oxygen Cardiovascular status: stable and blood pressure returned to baseline Postop Assessment: no apparent nausea or vomiting Anesthetic complications: no    Last Vitals:  Vitals:   04/25/17 1130 04/25/17 1140  BP: (!) 103/56 (!) 113/56  Pulse: 98 98  Resp:    Temp:  36.6 C  SpO2:  98%    Last Pain:  Vitals:   04/25/17 1140  TempSrc: Oral  PainSc:                  Ronald [name]

## 2017-04-26 DIAGNOSIS — I509 Heart failure, unspecified: Secondary | ICD-10-CM

## 2017-04-26 LAB — GLUCOSE, CAPILLARY
Glucose-Capillary: 136 mg/dL — ABNORMAL HIGH (ref 65–99)
Glucose-Capillary: 175 mg/dL — ABNORMAL HIGH (ref 65–99)

## 2017-04-26 MED ORDER — METOPROLOL SUCCINATE ER 25 MG PO TB24: 12.5000 mg | ORAL_TABLET | Freq: Two times a day (BID) | ORAL | 0 refills | Status: AC

## 2017-04-26 MED ORDER — METOPROLOL SUCCINATE ER 25 MG PO TB24: 12.5000 mg | ORAL_TABLET | Freq: Two times a day (BID) | ORAL | Status: DC

## 2017-04-26 NOTE — Discharge Summary (Addendum)
Physician Discharge Summary   [name] MRN: [id number] DOB/AGE: 04/03/1922 84 y.o.  Admit date: 04/10/2017 Discharge date: 04/26/2017  Primary Care Physician:  Russo, John, MD   Recommendations for Outpatient Follow-up:  1. Follow up with PCP in 1-2 weeks 2. Please obtain BMP/CBC in one week 3. He has dialysis spot at [facility] MWF pm  Home Health: Home health PT, OT, RN Equipment/Devices: DME 3 and 1, rolling walker, O2 2L via [place] to keep sats above 89%  Discharge Condition: Stable CODE STATUS:  DNR   Diet recommendation: CCHO regular    Discharge Diagnoses:    . Acute on chronic diastolic (congestive) heart failure (HCC) . Acute blood loss anemia .  AKI (acute kidney injury) (HCC) on CKD stage IV/ ESRD now started on HD . Chronic atrial fibrillation (HCC) . COPD (chronic obstructive pulmonary disease) (HCC) . Diabetes mellitus type 2, uncontrolled (HCC) . Essential hypertension . GI bleed . PVD, Rt SFA PTA/HSRA 06/17/11 . Thrombocytopenia (HCC)   Consults:  Nephrology Vascular surgery Palliative care   Allergies:   Allergies  Allergen Reactions  . Aspirin Anaphylaxis, Shortness Of Breath and Rash    "broke out in white welts; red blotches neck and face; windpipe closing; ~ 1962"     DISCHARGE MEDICATIONS: Allergies as of 04/26/2017      Reactions   Aspirin Anaphylaxis, Shortness Of Breath, Rash   "broke out in white welts; red blotches neck and face; windpipe closing; ~ 1962"      Medication List    STOP taking these medications   alendronate 70 MG tablet Commonly known as:  FOSAMAX   benazepril 20 MG tablet Commonly known as:  LOTENSIN   furosemide 40 MG tablet Commonly known as:  LASIX   Insulin Glargine 100 UNIT/ML Solostar Pen Commonly known as:  LANTUS   polyethylene glycol packet Commonly known as:  MIRALAX / GLYCOLAX   potassium chloride SA 20 MEQ tablet Commonly known as:  K-DUR,KLOR-CON     TAKE these medications    acetaminophen 325 MG tablet Commonly known as:  TYLENOL Take 650 mg by mouth every 6 (six) hours as needed for mild pain.   albuterol 0.63 MG/3ML nebulizer solution Commonly known as:  ACCUNEB Take 3 mLs by nebulization every 6 (six) hours as needed for wheezing or shortness of breath.   ANORO ELLIPTA 62.5-25 MCG/INH Aepb Generic drug:  umeclidinium-vilanterol Inhale 1 puff into the lungs daily.   buPROPion 150 MG 24 hr tablet Commonly known as:  WELLBUTRIN XL Take 150 mg by mouth daily.   diphenhydrAMINE 12.5 MG/5ML elixir Commonly known as:  BENADRYL Take 5 mLs (12.5 mg total) by mouth every 6 (six) hours as needed for itching. Over the counter   doxazosin 2 MG tablet Commonly known as:  CARDURA Take 1 tablet (2 mg total) by mouth at bedtime. What changed:    medication strength  how much to take  when to take this   ferrous sulfate 325 (65 FE) MG EC tablet Take 1 tablet (325 mg total) by mouth 2 (two) times daily.   finasteride 5 MG tablet Commonly known as:  PROSCAR Take 5 mg by mouth at bedtime.   fluticasone 50 MCG/ACT nasal spray Commonly known as:  FLONASE Place 1 spray into both nostrils 2 (two) times daily.   glyBURIDE 5 MG tablet Commonly known as:  DIABETA Take 1 tablet (5 mg total) by mouth daily with breakfast. What changed:    how much   to take  when to take this  additional instructions   loratadine 10 MG tablet Commonly known as:  CLARITIN Take 10 mg by mouth daily.   Melatonin 5 MG Tabs Take 5 mg by mouth at bedtime.   metoprolol succinate 25 MG 24 hr tablet Commonly known as:  TOPROL-XL Take 0.5 tablets (12.5 mg total) by mouth 2 (two) times daily with a meal. What changed:    medication strength  how much to take  when to take this  additional instructions   multivitamin Tabs tablet Take 1 tablet by mouth at bedtime.   simvastatin 20 MG tablet Commonly known as:  ZOCOR Take 20 mg by mouth every evening.   traMADol 50  MG tablet Commonly known as:  ULTRAM Take 1 tablet (50 mg total) by mouth every 12 (twelve) hours as needed for moderate pain.   warfarin 2 MG tablet Commonly known as:  COUMADIN Take 1-2 mg by mouth See admin instructions. 1mg by mouth once daily in the evening on Tuesday and Thursday. 2mg by mouth once daily in the evening on Monday, Wednesday, Friday, Saturday, Sunday            Durable Medical Equipment  (From admission, onward)        Start     Ordered   04/25/17 1433  For home use only DME oxygen  Once    Question Answer Comment  Mode or (Route) Nasal cannula   Liters per Minute 2   Frequency Continuous (stationary and portable oxygen unit needed)   Oxygen conserving device Yes   Oxygen delivery system Gas      04/25/17 1432   04/25/17 1201  For home use only DME 3 n 1  Once     04/25/17 1200   04/25/17 1201  For home use only DME Walker rolling  Once    Question:  Patient needs a walker to treat with the following condition  Answer:  Gait instability   04/25/17 1200       Brief H and P: For complete details please refer to admission H and P, but in brief 84yom presented with SOB, tarry stools. Admitted for acute on chronic diastolic CHF, acute GIB, AKI, elevated INR on warfarin. Seen by GI but Hgb remained stable. Warfarin stopped. GI recommended no intervention and signed off. Seen by nephrology with initial rec for conservative care, no HD. Subsequently patient opted for HD and was [facility] 2[facility]. HD started 2/21   Hospital Course:  Acute renal failure superimposed on stage 3 chronic kidney disease (HCC), now ESRD, started on hemodialysis -Patient had presented with increasing shortness of breath, progressive worsening of kidney function. At this point, suspected unknown GN. - Nephrology was consulted and initially it was felt that he may not be a dialysis candidate. -   palliative medicine was consulted. Patient wanted to pursue all the treatments and  hence nephrology recommended transferring the patient to [place] to initiate HD. TDC was placed.  - Patient started hemodialysis on 04/17/17 - GN workup was done, negative ANCA, anti-GBM, C3-C4 ANA hepatitis B and C. SPEP showed hypoalbuminemia but no evidence of monoclonal protein - Renal ultrasound showed no obstruction or hydronephrosis. - Vascular surgery was consulted, patient underwent permanent access placement on 2/28.  Acute on chronic diastolic CHF - 2-D echo 03/12/17 showed normal EF, moderate pulmonary hypertension, not sufficient to allow evaluation of LV diastolic dysfunction.  - Chest x-ray 2/17 had shown a stable cardiomegaly   with persistent moderate interstitial edema.  -Volume management with hemodialysis -Continue Toprol-XL  GI bleed, coagulopathy on admission, high INR - patient noted  "dark stools for a while, poor historian. GI was consulted, seen by Dr. Carlean Purl, recommended no plans for EGD at this time and continue to monitor conservatively.  -Continue Protonix -H&H currently stable, monitor closely  Acute blood loss anemia superimposed on anemia of chronic disease -H&H stable  Chronic atrial fibrillation -Rate controlled, continue Toprol-XL - Restart warfarin   Diabetes mellitus type II - Discontinued Lantus, decreased glipizide to 5 mg daily. Further adjustments of the regimen per outpatient PCP   Bioprosthetic AVR  COPD -Currently stable  Essential hypertension  - continue metoprolol, doxazosin decreased to 2 mg daily  Osteoporosis - Discontinued alendronate, per GI noted potentially ulcerogenic    Day of Discharge S: No complaints  BP (!) 105/50 (BP Location: Left Arm)   Pulse 69   Temp 98.2 F (36.8 C) (Oral)   Resp 18   Ht 5' 11" (1.803 m)   Wt 69.5 kg (153 lb 3.5 oz)   SpO2 98%   BMI 21.37 kg/m   Physical Exam:   General: Alert and awake, NAD, hearing deficit, dementia  Cardiovascular: S1 S2 clear, irregularly  irregular, no pedal edema b/l  Respiratory: Decreased breath sounds at the bases  Gastrointestinal: Soft, nontender, nondistended, + bowel sounds  Ext: no pedal edema bilaterally  Neuro: no new deficits  Musculoskeletal: No digital cyanosis, clubbing  Skin: No rashes  Psych: alert and awake, dementia   The results of significant diagnostics from this hospitalization (including imaging, microbiology, ancillary and laboratory) are listed below for reference.      Procedures/Studies:  2-D echo  Study Conclusions  - Left ventricle: The cavity size was normal. There was mild   concentric hypertrophy. Systolic function was normal. The   estimated ejection fraction was in the range of 60% to 65%. Wall   motion was normal; there were no regional wall motion   abnormalities. The study was not technically sufficient to allow   evaluation of LV diastolic dysfunction due to atrial   fibrillation. - Aortic valve: A bioprosthetic valve sits well in the aortic   position. Transaortic gradients were normal. There was no   paravalvular leak or central aortic insufficiency. Valve area   (VTI): 1.4 cm^2. Valve area (Vmax): 1.31 cm^2. Valve area   (Vmean): 1.42 cm^2. - Mitral valve: Calcified annulus. Moderately thickened, moderately   calcified leaflets . There was mild regurgitation. - Left atrium: The atrium was moderately dilated. - Right atrium: The atrium was mildly dilated. - Tricuspid valve: There was moderate regurgitation. - Pulmonary arteries: Systolic pressure was moderately increased.   PA peak pressure: 49 mm Hg (S). - Inferior vena cava: The vessel was normal in size.   Dg Chest 2 View  Result Date: 04/10/2017 CLINICAL DATA:  Shortness of breath. EXAM: CHEST  2 VIEW COMPARISON:  Radiograph 03/10/2017 FINDINGS: Post median sternotomy with prosthetic aortic valve. Prior left atrial clipping. Unchanged cardiomegaly. Progressive pulmonary edema from prior exam. Increased  bilateral pleural effusions. No pneumothorax. Left shoulder surgical hardware is partially included. IMPRESSION: Progressive pulmonary edema and increased bilateral pleural effusions consistent with worsening CHF. Electronically Signed   By: Jeb Levering M.D.   On: 04/10/2017 03:21   US Renal  Result Date: 04/12/2017 CLINICAL DATA:  Acute kidney injury EXAM: RENAL / URINARY TRACT ULTRASOUND COMPLETE COMPARISON:  CT abdomen pelvis 06/29/2011 FINDINGS: Right Kidney: Length: 11.2  cm. Cortical thinning diffusely with increased echogenicity of the cortex. Right upper pole cyst 14 mm. Negative for hydronephrosis or mass. Left Kidney: Length: 11.2 cm. Mild increased echogenicity of the cortex. Left upper pole cyst 2.4 cm. Negative for mass or hydronephrosis. Bladder: Appears normal for degree of bladder distention. IMPRESSION: Increased echogenicity of the renal cortex bilaterally. Cortical thinning noted in the right kidney. Bilateral renal cysts. Negative for hydronephrosis. Electronically Signed   By: Charles  Clark M.D.   On: 04/12/2017 13:04   Ir Fluoro Guide Cv Line Left  Result Date: 04/17/2017 INDICATION: 89-year-old male with acute CHF and acute exacerbation of chronic kidney disease now requiring hemodialysis. He presents for placement of a tunneled hemodialysis catheter. He has a vesicular rash overlying his right chest that appears to represent herpes zoster. Therefore, we will proceed with placement of a left-sided HD catheter. EXAM: TUNNELED CENTRAL VENOUS HEMODIALYSIS CATHETER PLACEMENT WITH ULTRASOUND AND FLUOROSCOPIC GUIDANCE MEDICATIONS: 2 g Ancef. The antibiotic was given in an appropriate time interval prior to skin puncture. ANESTHESIA/SEDATION: 25 mg mcg fentanyl administered intravenously for pain control. This does not constitute conscious sedation. FLUOROSCOPY TIME:  Fluoroscopy Time: 1 minutes 0 seconds (3 mGy). COMPLICATIONS: None immediate. PROCEDURE: Informed written consent was  obtained from the patient after a discussion of the risks, benefits, and alternatives to treatment. Questions regarding the procedure were encouraged and answered. The left neck and chest were prepped with chlorhexidine in a sterile fashion, and a sterile drape was applied covering the operative field. Maximum barrier sterile technique with sterile gowns and gloves were used for the procedure. A timeout was performed prior to the initiation of the procedure. After creating a small venotomy incision, a micropuncture kit was utilized to access the left internal jugular vein under direct, real-time ultrasound guidance after the overlying soft tissues were anesthetized with 1% lidocaine with epinephrine. Ultrasound image documentation was performed. The microwire was kinked to measure appropriate catheter length. A stiff Glidewire was advanced to the level of the IVC and the micropuncture sheath was exchanged for a peel-away sheath. A palindrome tunneled hemodialysis catheter measuring 28 cm from tip to cuff was tunneled in a retrograde fashion from the anterior chest wall to the venotomy incision. The catheter was then placed through the peel-away sheath with tips ultimately positioned within the superior aspect of the right atrium. Final catheter positioning was confirmed and documented with a spot radiographic image. The catheter aspirates and flushes normally. The catheter was flushed with appropriate volume heparin dwells. The catheter exit site was secured with a 0-Prolene retention suture. The venotomy incision was closed with Dermabond. Dressings were applied. The patient tolerated the procedure well without immediate post procedural complication. IMPRESSION: Successful placement of 28 cm tip to cuff tunneled hemodialysis catheter via the left internal jugular vein with tips terminating within the superior aspect of the right atrium. The catheter is ready for immediate use. Electronically Signed   By: Heath   McCullough M.D.   On: 04/17/2017 10:42   Ir Us Guide Vasc Access Left  Result Date: 04/17/2017 INDICATION: 89-year-old male with acute CHF and acute exacerbation of chronic kidney disease now requiring hemodialysis. He presents for placement of a tunneled hemodialysis catheter. He has a vesicular rash overlying his right chest that appears to represent herpes zoster. Therefore, we will proceed with placement of a left-sided HD catheter. EXAM: TUNNELED CENTRAL VENOUS HEMODIALYSIS CATHETER PLACEMENT WITH ULTRASOUND AND FLUOROSCOPIC GUIDANCE MEDICATIONS: 2 g Ancef. The antibiotic was given in an appropriate time   interval prior to skin puncture. ANESTHESIA/SEDATION: 25 mg mcg fentanyl administered intravenously for pain control. This does not constitute conscious sedation. FLUOROSCOPY TIME:  Fluoroscopy Time: 1 minutes 0 seconds (3 mGy). COMPLICATIONS: None immediate. PROCEDURE: Informed written consent was obtained from the patient after a discussion of the risks, benefits, and alternatives to treatment. Questions regarding the procedure were encouraged and answered. The left neck and chest were prepped with chlorhexidine in a sterile fashion, and a sterile drape was applied covering the operative field. Maximum barrier sterile technique with sterile gowns and gloves were used for the procedure. A timeout was performed prior to the initiation of the procedure. After creating a small venotomy incision, a micropuncture kit was utilized to access the left internal jugular vein under direct, real-time ultrasound guidance after the overlying soft tissues were anesthetized with 1% lidocaine with epinephrine. Ultrasound image documentation was performed. The microwire was kinked to measure appropriate catheter length. A stiff Glidewire was advanced to the level of the IVC and the micropuncture sheath was exchanged for a peel-away sheath. A palindrome tunneled hemodialysis catheter measuring 28 cm from tip to cuff was  tunneled in a retrograde fashion from the anterior chest wall to the venotomy incision. The catheter was then placed through the peel-away sheath with tips ultimately positioned within the superior aspect of the right atrium. Final catheter positioning was confirmed and documented with a spot radiographic image. The catheter aspirates and flushes normally. The catheter was flushed with appropriate volume heparin dwells. The catheter exit site was secured with a 0-Prolene retention suture. The venotomy incision was closed with Dermabond. Dressings were applied. The patient tolerated the procedure well without immediate post procedural complication. IMPRESSION: Successful placement of 28 cm tip to cuff tunneled hemodialysis catheter via the left internal jugular vein with tips terminating within the superior aspect of the right atrium. The catheter is ready for immediate use. Electronically Signed   By: Jacqulynn Cadet M.D.   On: 04/17/2017 10:42   Dg Chest Port 1 View  Result Date: 04/19/2017 CLINICAL DATA:  Pulmonary edema EXAM: PORTABLE CHEST 1 VIEW COMPARISON:  04/13/2017 FINDINGS: Left jugular dialysis catheter has been placed with its tip at the cavoatrial junction. Atrial appendage stable remains in place. Hazy hazy airspace disease compatible with pulmonary edema is stable on the left and worse in the right lung. Small right pleural effusion is not excluded. IMPRESSION: Bilateral hazy pulmonary edema is stable on the left and worsening on the right. Small right pleural effusion is not excluded. Electronically Signed   By: Marybelle Killings M.D.   On: 04/19/2017 13:40   Dg Chest Port 1 View  Result Date: 04/13/2017 CLINICAL DATA:  Hypoxia. EXAM: PORTABLE CHEST 1 VIEW COMPARISON:  04/12/2017 FINDINGS: Lungs are adequately inflated and demonstrate continued hazy perihilar bibasilar opacification likely persistent moderate interstitial edema and possible small bilateral pleural effusions. Stable cardiomegaly.  Remainder the exam is unchanged. IMPRESSION: Stable cardiomegaly and persistent moderate interstitial edema. Likely small amount of bilateral pleural fluid. Electronically Signed   By: Marin Olp M.D.   On: 04/13/2017 08:05   Dg Chest Port 1 View  Result Date: 04/12/2017 CLINICAL DATA:  Hypoxia EXAM: PORTABLE CHEST 1 VIEW COMPARISON:  04/11/2017 FINDINGS: Diffuse bilateral airspace disease with mild interval progression. Bilateral pleural effusions also with mild progression. Cardiac enlargement. Aortic valve replacement. Left atrial clip. Bibasilar atelectasis. IMPRESSION: Progression of congestive heart failure. Worsening edema and bilateral effusions. Electronically Signed   By: Franchot Gallo M.D.  On: 04/12/2017 06:37   Dg Chest Port 1 View  Result Date: 04/11/2017 CLINICAL DATA:  Shortness of Breath EXAM: PORTABLE CHEST 1 VIEW COMPARISON:  April 10, 2017 FINDINGS: There is cardiomegaly with pulmonary venous hypertension. There are pleural effusions bilaterally with bibasilar atelectasis. No airspace consolidation or pulmonary edema evident. There is a prosthetic aortic valve present. There is a left atrial appendage clamp. No adenopathy. There is postoperative total shoulder replacement on the left. IMPRESSION: Pulmonary vascular congestion with bibasilar atelectasis and small pleural effusions. No frank edema or consolidation evident. Areas of postoperative change noted. Electronically Signed   By: Lowella Grip III M.D.   On: 04/11/2017 11:19      LAB RESULTS: Basic Metabolic Panel: Recent Labs  Lab 04/23/17 0646 04/25/17 0800  NA 136 136  K 3.2* 4.7  CL 98* 101  CO2 27 25  GLUCOSE 136* 128*  BUN 28* 22*  CREATININE 4.23* 4.33*  CALCIUM 8.7* 8.7*  PHOS 3.9 4.6   Liver Function Tests: Recent Labs  Lab 04/23/17 0646 04/25/17 0800  ALBUMIN 2.5* 2.4*   No results for input(s): LIPASE, AMYLASE in the last 168 hours. No results for input(s): AMMONIA in the last 168  hours. CBC: Recent Labs  Lab 04/23/17 0646 04/25/17 0822  WBC 7.6 7.4  HGB 8.9* 8.6*  HCT 27.5* 27.1*  MCV 94.5 96.8  PLT 118* 131*   Cardiac Enzymes: No results for input(s): CKTOTAL, CKMB, CKMBINDEX, TROPONINI in the last 168 hours. BNP: Invalid input(s): POCBNP CBG: Recent Labs  Lab 04/26/17 0740 04/26/17 1154  GLUCAP 136* 175*      Disposition and Follow-up: Discharge Instructions    Diet Carb Modified   Complete by:  As directed    Increase activity slowly   Complete by:  As directed        DISPOSITION: Assisted living facility  [place] information for follow-up providers    Conrad Mapleton, MD Follow up in 2 week(s).   Specialties:  Vascular Surgery, Cardiology Why:  Office will call you to arrange your appt (sent) Contact information: [address] [place] [address] [phone]        [facility] Follow up.   Specialty:  Home Health Services Why:  Home health services arranged Contact information: [place] [address] [phone]        [facility] - Dme Follow up.   Why:  home oxygen, rolling walker ,3 in 1/ [facility] will be delivered to facility Contact information: [address] [place] [address] [phone]            Contact information for after-discharge care    Destination    [facility] .   Service:  Assisted Living Contact information: [place] [phone]                   Time coordinating discharge:  35 mins    Signed:   Estill Cotta M.D. [facility] 04/26/2017, 12:07 PM Pager: [phone]

## 2017-04-26 NOTE — Progress Notes (Signed)
CSW spoke [place] at [facility].  Oxygen has not been delivered to facility.  Pt can not return to facility without oxygen.  Ronald [name] will be leaving [facility] at 1:30pm today.  They will not have an RN there after this time.  Ronald [name] has been approved for ground coverage [id number] Ref# [id number]

## 2017-04-26 NOTE — Progress Notes (Signed)
Subjective: Interval History: has no complaint, to go [facility] today.  Objective: Vital signs in last 24 hours: Temp:  [97.5 F (36.4 C)-98.2 F (36.8 C)] 98.2 F (36.8 C) (03/02 0533) Pulse Rate:  [62-98] 69 (03/02 0533) Resp:  [18] 18 (03/02 0533) BP: (103-122)/(42-59) 105/50 (03/02 0533) SpO2:  [95 %-98 %] 98 % (03/02 0533) Weight:  [69.5 kg (153 lb 3.5 oz)] 69.5 kg (153 lb 3.5 oz) (03/01 1140) Weight change: 0.4 kg (14.1 oz)  Intake/Output from previous day: 03/01 0701 - 03/02 0700 In: -  Out: 2000  Intake/Output this shift: No intake/output data recorded.  General appearance: cooperative, pale and HOH Resp: diminished breath sounds bilaterally Chest wall: IJ  PC Cardio: S1, S2 normal and systolic murmur: systolic ejection 2/6, decrescendo at 2nd left intercostal space GI: pos bs, soft Extremities: AVG LUA pos bs,.   Lab Results: Recent Labs    04/25/17 0822  WBC 7.4  HGB 8.6*  HCT 27.1*  PLT 131*   BMET:  Recent Labs    04/25/17 0800  NA 136  K 4.7  CL 101  CO2 25  GLUCOSE 128*  BUN 22*  CREATININE 4.33*  CALCIUM 8.7*   No results for input(s): PTH in the last 72 hours. Iron Studies: No results for input(s): IRON, TIBC, TRANSFERRIN, FERRITIN in the last 72 hours.  Studies/Results: No results found.  I have reviewed the patient's current medications.  Assessment/Plan: 1 ESRD HD MWF at [facility] 2 bp lower, lower metop 3 Anemia esa 4 HPTH ok 5 AVR 6 HOH P D/c , lower metop    LOS: 16 days   Jeneen Rinks Lannis Lichtenwalner 04/26/2017,10:02 AM

## 2017-04-26 NOTE — Progress Notes (Signed)
Pt prepared for d/c to ALF. IV d/c'd. Skin intact except as charted in most recent assessments. Vitals are stable. Report called to receiving facility. Pt to be transported by ambulance service.

## 2017-04-26 NOTE — Progress Notes (Signed)
Patient will Discharge To: Power Date:04/26/17 Family Notified: yes, Jerre Vandrunen [phone] Transport By: Corey Harold   Per MD patient ready for DC to [place] . RN, patient, patient's family, and facility notified of DC. Assessment, Fl2/Pasrr, and Discharge Summary sent to facility. RN given number for report [phone]). DC packet on chart. Ambulance transport requested for patient.   CSW signing off.  Reed Breech LCSWA [phone]

## 2017-04-26 NOTE — Progress Notes (Signed)
[facility]                                                                              Patient Demographics  Ronald [name], is a 84 y.o. male, DOB - October 11, 1922, VUD:[id number]  Admit date - 04/10/2017   Admitting Physician Cristy Folks, MD  Outpatient Primary MD for the patient is [name], MD  Outpatient specialists:   LOS - 16  days   Medical records reviewed and are as summarized below:    Chief Complaint  Patient presents with  . Shortness of Breath       Brief summary   89yom presented with SOB, tarry stools. Admitted for acute on chronic diastolic CHF, acute GIB, AKI, elevated INR on warfarin. Seen by GI but Hgb remained stable. Warfarin stopped. GI recommended no intervention and signed off. Seen by nephrology with initial rec for conservative care, no HD. Subsequently patient opted for HD and was transferred to [facility] 2/20. HD started 2/21      Assessment & Plan    Principal Problem:   Acute renal failure superimposed on stage 3 chronic kidney disease ([place]), now ESRD, started on hemodialysis -Started on HD on 2/21, discussed with nephrology, Dr. Jimmy Footman -Permanent access placed on 2/28 -Discharge had yesterday due to Oxygen equipment not available. Stable for discharge today to skilled nursing facility.  - Patient has a spot at outpatient dialysis center, MWF pm  Active Problems: Acute on chronic diastolic CHF -Volume management with hemodialysis -Continue Toprol-XL  GI bleed, coagulopathy on admission, high INR - GI recommended conservative care -Continue Protonix, H&H stable  Acute blood loss anemia superimposed on anemia of chronic disease -H&H stable  Chronic atrial fibrillation -Rate controlled, continue Toprol-XL -Warfarin on hold for permanent access placement  Diabetes mellitus type 2 -Continue sliding scale insulin  Bioprosthetic AVR  COPD -Currently stable  Essential hypertension  -  continue metoprolol, BP  soft   Code Status:  DNR  DVT Prophylaxis:  SCD's Family Communication: Discussed in detail with the patient, all imaging results, lab results explained to the patient   Disposition Plan:DC to facility today, DC summary from yesterday 3/1 still stands, no changes  Time Spent in minutes 64mns   Procedures:  Hemodialysis Left IJ tunneled catheter  Consultants:   Nephrology Vascular surgery  Antimicrobials:   None   Medications  Scheduled Meds: . buPROPion  150 mg Oral Daily  . Chlorhexidine Gluconate Cloth  6 each Topical Daily  . darbepoetin (ARANESP) injection - DIALYSIS  100 mcg Intravenous Q Mon-HD  . doxazosin  2 mg Oral QHS  . finasteride  5 mg Oral QHS  . fluticasone  1 spray Each Nare BID  . insulin aspart  0-5 Units Subcutaneous QHS  . insulin aspart  0-9 Units Subcutaneous TID WC  . loratadine  10 mg Oral Daily  . Melatonin  4.5 mg Oral QHS  . metoprolol succinate  12.5 mg Oral BID WC  . multivitamin  1 tablet Oral QHS  . mupirocin ointment  1 application Nasal BID  . oxymetazoline  2 spray Right Nare UD  . pantoprazole  40  mg Oral Daily  . simvastatin  20 mg Oral QPM  . sodium chloride flush  3 mL Intravenous Q12H  . umeclidinium-vilanterol  1 puff Inhalation Daily   Continuous Infusions: . sodium chloride Stopped (04/24/17 1435)   PRN Meds:.acetaminophen **OR** acetaminophen, albuterol, camphor-menthol, diphenhydrAMINE, diphenhydrAMINE, ondansetron **OR** ondansetron (ZOFRAN) IV, polyethylene glycol, traMADol   Antibiotics   Anti-infectives (From admission, onward)   Start     Dose/Rate Route Frequency Ordered Stop   04/17/17 1200  ceFAZolin (ANCEF) IVPB 2g/100 mL premix     2 g 200 mL/hr over 30 Minutes Intravenous To Radiology 04/16/17 1723 04/18/17 1200        Subjective:   Ronald [name] was seen and examined today.   no complaints. No fevers or chills. Patient denies dizziness, abdominal pain, N/V/D/C, new weakness, numbess, tingling. No  acute events overnight.    Objective:   Vitals:   04/25/17 1140 04/25/17 1431 04/25/17 2115 04/26/17 0533  BP: (!) 113/56 (!) 109/42 (!) 110/45 (!) 105/50  Pulse: 98 62 65 69  Resp:  _0 Temp: 97.8 F (36.6 C) 97.6 F (36.4 C) (!) 97.5 F (36.4 C) 98.2 F (36.8 C)  TempSrc: Oral Oral Oral Oral  SpO2: 98% 95% 96% 98%  Weight: 69.5 kg (153 lb 3.5 oz)     Height:        Intake/Output Summary (Last 24 hours) at 04/26/2017 1035 Last data filed at 04/25/2017 1140 Gross per 24 hour  Intake -  Output 2000 ml  Net -2000 ml     Wt Readings from Last 3 Encounters:  04/25/17 69.5 kg (153 lb 3.5 oz)  03/13/17 77.6 kg (171 lb)  12/31/16 81.2 kg (179 lb)     Exam  Physical Exam  General: Alert and awake, NAD   Eyes:   HEENT:    Cardiovascular: S1 S2 clear. Regular rate and rhythm. No pedal edema b/l  Respiratory: Clear to auscultation bilaterally, no wheezing, rales or rhonchi  Gastrointestinal: Soft, nontender, nondistended, + bowel sounds  Ext: no pedal edema bilaterally  Neuro: no new deficit  Musculoskeletal : No digital cyanosis, clubbing  Skin: No rashes Psych:alert and awake, has dementia  Data Reviewed:  I have personally reviewed following labs and imaging studies  Micro Results Recent Results (from the past 240 hour(s))  Surgical pcr screen     Status: Abnormal   Collection Time: 04/24/17 12:39 AM  Result Value Ref Range Status   MRSA, PCR NEGATIVE NEGATIVE Final   Staphylococcus aureus POSITIVE (A) NEGATIVE Final    Comment: (NOTE) The Xpert SA Assay (FDA approved for NASAL specimens in patients 103 years of age and older), is one component of a comprehensive surveillance program. It is not intended to diagnose infection nor to guide or monitor treatment. Performed at [facility], [place] [address], [place], [place] [address]     Radiology Reports Dg Chest 2 View  Result Date: 04/10/2017 CLINICAL DATA:  Shortness of breath. EXAM:  CHEST  2 VIEW COMPARISON:  Radiograph 03/10/2017 FINDINGS: Post median sternotomy with prosthetic aortic valve. Prior left atrial clipping. Unchanged cardiomegaly. Progressive pulmonary edema from prior exam. Increased bilateral pleural effusions. No pneumothorax. Left shoulder surgical hardware is partially included. IMPRESSION: Progressive pulmonary edema and increased bilateral pleural effusions consistent with worsening CHF. Electronically Signed   By: Jeb Levering M.D.   On: 04/10/2017 03:21   US Renal  Result Date: 04/12/2017 CLINICAL DATA:  Acute kidney injury EXAM: RENAL /  URINARY TRACT ULTRASOUND COMPLETE COMPARISON:  CT abdomen pelvis 06/29/2011 FINDINGS: Right Kidney: Length: 11.2 cm. Cortical thinning diffusely with increased echogenicity of the cortex. Right upper pole cyst 14 mm. Negative for hydronephrosis or mass. Left Kidney: Length: 11.2 cm. Mild increased echogenicity of the cortex. Left upper pole cyst 2.4 cm. Negative for mass or hydronephrosis. Bladder: Appears normal for degree of bladder distention. IMPRESSION: Increased echogenicity of the renal cortex bilaterally. Cortical thinning noted in the right kidney. Bilateral renal cysts. Negative for hydronephrosis. Electronically Signed   By: Franchot Gallo M.D.   On: 04/12/2017 13:04   Ir Fluoro Guide Cv Line Left  Result Date: 04/17/2017 INDICATION: 84 year old male with acute CHF and acute exacerbation of chronic kidney disease now requiring hemodialysis. He presents for placement of a tunneled hemodialysis catheter. He has a vesicular rash overlying his right chest that appears to represent herpes zoster. Therefore, we will proceed with placement of a left-sided HD catheter. EXAM: TUNNELED CENTRAL VENOUS HEMODIALYSIS CATHETER PLACEMENT WITH ULTRASOUND AND FLUOROSCOPIC GUIDANCE MEDICATIONS: 2 g Ancef. The antibiotic was given in an appropriate time interval prior to skin puncture. ANESTHESIA/SEDATION: 25 mg mcg fentanyl  administered intravenously for pain control. This does not constitute conscious sedation. FLUOROSCOPY TIME:  Fluoroscopy Time: 1 minutes 0 seconds (3 mGy). COMPLICATIONS: None immediate. PROCEDURE: Informed written consent was obtained from the patient after a discussion of the risks, benefits, and alternatives to treatment. Questions regarding the procedure were encouraged and answered. The left neck and chest were prepped with chlorhexidine in a sterile fashion, and a sterile drape was applied covering the operative field. Maximum barrier sterile technique with sterile gowns and gloves were used for the procedure. A timeout was performed prior to the initiation of the procedure. After creating a small venotomy incision, a micropuncture kit was utilized to access the left internal jugular vein under direct, real-time ultrasound guidance after the overlying soft tissues were anesthetized with 1% lidocaine with epinephrine. Ultrasound image documentation was performed. The microwire was kinked to measure appropriate catheter length. A stiff Glidewire was advanced to the level of the IVC and the micropuncture sheath was exchanged for a peel-away sheath. A palindrome tunneled hemodialysis catheter measuring 28 cm from tip to cuff was tunneled in a retrograde fashion from the anterior chest wall to the venotomy incision. The catheter was then placed through the peel-away sheath with tips ultimately positioned within the superior aspect of the right atrium. Final catheter positioning was confirmed and documented with a spot radiographic image. The catheter aspirates and flushes normally. The catheter was flushed with appropriate volume heparin dwells. The catheter exit site was secured with a 0-Prolene retention suture. The venotomy incision was closed with Dermabond. Dressings were applied. The patient tolerated the procedure well without immediate post procedural complication. IMPRESSION: Successful placement of 28 cm  tip to cuff tunneled hemodialysis catheter via the left internal jugular vein with tips terminating within the superior aspect of the right atrium. The catheter is ready for immediate use. Electronically Signed   By: Jacqulynn Cadet M.D.   On: 04/17/2017 10:42   Ir US Guide Vasc Access Left  Result Date: 04/17/2017 INDICATION: 84 year old male with acute CHF and acute exacerbation of chronic kidney disease now requiring hemodialysis. He presents for placement of a tunneled hemodialysis catheter. He has a vesicular rash overlying his right chest that appears to represent herpes zoster. Therefore, we will proceed with placement of a left-sided HD catheter. EXAM: TUNNELED CENTRAL VENOUS HEMODIALYSIS CATHETER PLACEMENT WITH ULTRASOUND  AND FLUOROSCOPIC GUIDANCE MEDICATIONS: 2 g Ancef. The antibiotic was given in an appropriate time interval prior to skin puncture. ANESTHESIA/SEDATION: 25 mg mcg fentanyl administered intravenously for pain control. This does not constitute conscious sedation. FLUOROSCOPY TIME:  Fluoroscopy Time: 1 minutes 0 seconds (3 mGy). COMPLICATIONS: None immediate. PROCEDURE: Informed written consent was obtained from the patient after a discussion of the risks, benefits, and alternatives to treatment. Questions regarding the procedure were encouraged and answered. The left neck and chest were prepped with chlorhexidine in a sterile fashion, and a sterile drape was applied covering the operative field. Maximum barrier sterile technique with sterile gowns and gloves were used for the procedure. A timeout was performed prior to the initiation of the procedure. After creating a small venotomy incision, a micropuncture kit was utilized to access the left internal jugular vein under direct, real-time ultrasound guidance after the overlying soft tissues were anesthetized with 1% lidocaine with epinephrine. Ultrasound image documentation was performed. The microwire was kinked to measure appropriate  catheter length. A stiff Glidewire was advanced to the level of the IVC and the micropuncture sheath was exchanged for a peel-away sheath. A palindrome tunneled hemodialysis catheter measuring 28 cm from tip to cuff was tunneled in a retrograde fashion from the anterior chest wall to the venotomy incision. The catheter was then placed through the peel-away sheath with tips ultimately positioned within the superior aspect of the right atrium. Final catheter positioning was confirmed and documented with a spot radiographic image. The catheter aspirates and flushes normally. The catheter was flushed with appropriate volume heparin dwells. The catheter exit site was secured with a 0-Prolene retention suture. The venotomy incision was closed with Dermabond. Dressings were applied. The patient tolerated the procedure well without immediate post procedural complication. IMPRESSION: Successful placement of 28 cm tip to cuff tunneled hemodialysis catheter via the left internal jugular vein with tips terminating within the superior aspect of the right atrium. The catheter is ready for immediate use. Electronically Signed   By: Jacqulynn Cadet M.D.   On: 04/17/2017 10:42   Dg Chest Port 1 View  Result Date: 04/19/2017 CLINICAL DATA:  Pulmonary edema EXAM: PORTABLE CHEST 1 VIEW COMPARISON:  04/13/2017 FINDINGS: Left jugular dialysis catheter has been placed with its tip at the cavoatrial junction. Atrial appendage stable remains in place. Hazy hazy airspace disease compatible with pulmonary edema is stable on the left and worse in the right lung. Small right pleural effusion is not excluded. IMPRESSION: Bilateral hazy pulmonary edema is stable on the left and worsening on the right. Small right pleural effusion is not excluded. Electronically Signed   By: Marybelle Killings M.D.   On: 04/19/2017 13:40   Dg Chest Port 1 View  Result Date: 04/13/2017 CLINICAL DATA:  Hypoxia. EXAM: PORTABLE CHEST 1 VIEW COMPARISON:  04/12/2017  FINDINGS: Lungs are adequately inflated and demonstrate continued hazy perihilar bibasilar opacification likely persistent moderate interstitial edema and possible small bilateral pleural effusions. Stable cardiomegaly. Remainder the exam is unchanged. IMPRESSION: Stable cardiomegaly and persistent moderate interstitial edema. Likely small amount of bilateral pleural fluid. Electronically Signed   By: Marin Olp M.D.   On: 04/13/2017 08:05   Dg Chest Port 1 View  Result Date: 04/12/2017 CLINICAL DATA:  Hypoxia EXAM: PORTABLE CHEST 1 VIEW COMPARISON:  04/11/2017 FINDINGS: Diffuse bilateral airspace disease with mild interval progression. Bilateral pleural effusions also with mild progression. Cardiac enlargement. Aortic valve replacement. Left atrial clip. Bibasilar atelectasis. IMPRESSION: Progression of congestive heart failure. Worsening  edema and bilateral effusions. Electronically Signed   By: Franchot Gallo M.D.   On: 04/12/2017 06:37   Dg Chest Port 1 View  Result Date: 04/11/2017 CLINICAL DATA:  Shortness of Breath EXAM: PORTABLE CHEST 1 VIEW COMPARISON:  April 10, 2017 FINDINGS: There is cardiomegaly with pulmonary venous hypertension. There are pleural effusions bilaterally with bibasilar atelectasis. No airspace consolidation or pulmonary edema evident. There is a prosthetic aortic valve present. There is a left atrial appendage clamp. No adenopathy. There is postoperative total shoulder replacement on the left. IMPRESSION: Pulmonary vascular congestion with bibasilar atelectasis and small pleural effusions. No frank edema or consolidation evident. Areas of postoperative change noted. Electronically Signed   By: Lowella Grip III M.D.   On: 04/11/2017 11:19    Lab Data:  CBC: Recent Labs  Lab 04/20/17 1358 04/21/17 0746 04/22/17 0654 04/23/17 0646 04/25/17 0822  WBC 6.8 8.2 7.9 7.6 7.4  HGB 9.1* 8.6* 9.1* 8.9* 8.6*  HCT 28.3* 26.8* 28.2* 27.5* 27.1*  MCV 92.5 92.4 93.7  94.5 96.8  PLT 96* 104* 114* 118* 631*   Basic Metabolic Panel: Recent Labs  Lab 04/20/17 1358 04/21/17 0703 04/22/17 0654 04/23/17 0646 04/25/17 0800  NA 134* 134* 136 136 136  K 3.5 4.6 3.2* 3.2* 4.7  CL 97* 99* 98* 98* 101  CO2 _0 GLUCOSE 136* 134* 127* 136* 128*  BUN 20 28* 13 28* 22*  CREATININE 2.94* 3.69* 2.74* 4.23* 4.33*  CALCIUM 8.2* 8.4* 8.4* 8.7* 8.7*  PHOS  --   --   --  3.9 4.6   GFR: Estimated Creatinine Clearance: 10.5 mL/min (A) (by C-G formula based on SCr of 4.33 mg/dL (H)). Liver Function Tests: Recent Labs  Lab 04/23/17 0646 04/25/17 0800  ALBUMIN 2.5* 2.4*   No results for input(s): LIPASE, AMYLASE in the last 168 hours. No results for input(s): AMMONIA in the last 168 hours. Coagulation Profile: No results for input(s): INR, PROTIME in the last 168 hours. Cardiac Enzymes: No results for input(s): CKTOTAL, CKMB, CKMBINDEX, TROPONINI in the last 168 hours. BNP (last 3 results) No results for input(s): PROBNP in the last 8760 hours. HbA1C: No results for input(s): HGBA1C in the last 72 hours. CBG: Recent Labs  Lab 04/24/17 1544 04/24/17 2138 04/25/17 1345 04/25/17 2205 04/26/17 0740  GLUCAP 114* 197* 135* 153* 136*   Lipid Profile: No results for input(s): CHOL, HDL, LDLCALC, TRIG, CHOLHDL, LDLDIRECT in the last 72 hours. Thyroid Function Tests: No results for input(s): TSH, T4TOTAL, FREET4, T3FREE, THYROIDAB in the last 72 hours. Anemia Panel: No results for input(s): VITAMINB12, FOLATE, FERRITIN, TIBC, IRON, RETICCTPCT in the last 72 hours. Urine analysis:    Component Value Date/Time   COLORURINE YELLOW 04/15/2017 0201   APPEARANCEUR CLOUDY (A) 04/15/2017 0201   LABSPEC 1.013 04/15/2017 0201   PHURINE 5.0 04/15/2017 0201   GLUCOSEU 50 (A) 04/15/2017 0201   HGBUR LARGE (A) 04/15/2017 0201   BILIRUBINUR NEGATIVE 04/15/2017 0201   KETONESUR NEGATIVE 04/15/2017 0201   PROTEINUR 30 (A) 04/15/2017 0201   UROBILINOGEN  1.0 12/13/2013 1556   NITRITE NEGATIVE 04/15/2017 0201   LEUKOCYTESUR LARGE (A) 04/15/2017 0201     Xena Propst M.D. [facility] 04/26/2017, 10:35 AM  Pager: [phone] Between 7am to 7pm - call Pager - [phone]  After 7pm go to [email or web address] - password [email or web address]  Call night coverage person covering after 7pm

## 2017-04-26 NOTE — NC FL2 (Signed)
[place] MEDICAID FL2 LEVEL OF CARE SCREENING TOOL     IDENTIFICATION  Patient Name: Ronald [name] Birthdate: 1922/12/24 Sex: male Admission Date (Current Location): 04/10/2017  [facility] and [place] Number:  Herbalist and Address:  [facility], [place] [address] [place], [place] [address]      Provider Number: [id number]  Attending Physician Name and Address:  Mendel Corning, MD  Relative Name and Phone Number:  Fontaine, daughter, [phone]    Current Level of Care: Hospital Recommended Level of Care: [place] Prior Approval Number:    Date Approved/Denied:   PASRR Number:    Discharge Plan: Other (Comment)(ALF)    Current Diagnoses: Patient Active Problem List   Diagnosis Date Noted  . Acute on chronic congestive heart failure ([place])   . Acute renal failure superimposed on stage 3 chronic kidney disease ([place])   . Goals of care, counseling/discussion   . DNR (do not resuscitate) discussion   . Palliative care encounter   . Acute respiratory distress 03/10/2017  . Skin excoriation 03/10/2017  . Closed fracture of left proximal humerus 12/26/2016  . Comminuted left humeral fracture 12/23/2016  . GI bleed 07/02/2016  . Lactic acidemia 07/02/2016  . Lower GI bleed   . Tobacco use 03/07/2015  . Diabetes mellitus type 2, uncontrolled ([place]) 12/20/2013  . Acute blood loss anemia 12/20/2013  . Allergic rhinitis 12/20/2013  . Intertrochanteric fracture of left femur ([place]) 12/12/2013  . Anemia 12/12/2013  . Right acetabular fracture ([place]) 04/11/2013  . Acetabular fracture ([place]) 04/11/2013  . Bilateral claudication of lower limb ([place]) 12/24/2012  . Cellulitis 12/02/2012  . History of tobacco abuse- 75 years, quit in Feb 2014 07/06/2012  . Acute on chronic diastolic (congestive) heart failure ([place]) 07/06/2012  . COPD (chronic obstructive pulmonary disease) ([place]) 05/27/2012  . Pulmonary nodules 05/27/2012  . Normal  coronary arteries, cath 11/12 07/25/2011  . Normal left ventricular systolic function, Echo 0/97 07/25/2011  . Closed right hip fracture, ORIF 04/30/30 complicated by gluteal hematoma while being Coumadinized post op 06/27/2011  . Aspirin allergy, rash, SOB. Pt is on Plavix 06/19/2011  . Chronic atrial fibrillation ([place]) 06/19/2011  . Thrombocytopenia ([place]) 01/11/2011  . Leukocytosis 01/11/2011  . S/P aortic valve replacement: #23 Magna Ease Edwards Pericardial Valve  November 2012 01/08/2011  . AS (aortic stenosis)- s/p tissue AVR 01/07/11   . CAROTID BRUIT- moderate ICA disease 5/14 05/31/2008  . Diabetes mellitus with complication ([place]) 99/24/2683  . Hyperlipidemia 07/11/2007  . DEPRESSION 07/11/2007  . RESTLESS LEGS SYNDROME 07/11/2007  . Essential hypertension 07/11/2007  . PVD, Rt SFA PTA/HSRA 06/17/11 07/11/2007  . DIVERTICULOSIS, COLON 07/11/2007  . RENAL CYST 07/11/2007  . ARTHRITIS 07/11/2007  . SKIN CANCER, HX OF 07/11/2007  . RHEUMATIC HEART DISEASE, HX OF 07/11/2007    Orientation RESPIRATION BLADDER Height & Weight     Self, Time, Situation, Place  O2(Nasal cannula 2L) Continent Weight: 153 lb 3.5 oz (69.5 kg) Height:  5\' 11"  (180.3 cm)  BEHAVIORAL SYMPTOMS/MOOD NEUROLOGICAL BOWEL NUTRITION STATUS      Continent Diet(CCHO Regular)  AMBULATORY STATUS COMMUNICATION OF NEEDS Skin   Limited Assist Verbally Normal                       Personal Care Assistance Level of Assistance  Bathing, Feeding, Dressing Bathing Assistance: Limited assistance Feeding assistance: Independent Dressing Assistance: Limited assistance     Functional Limitations Info  Hearing   Hearing Info: Impaired      SPECIAL CARE FACTORS FREQUENCY  PT (By licensed PT)     PT Frequency: home health PT 3x/week              Contractures      Additional Factors Info  Code Status, Allergies, Psychotropic, Insulin Sliding Scale Code Status Info: DNR Allergies Info:  Aspirin Psychotropic Info: Wellbutrin Insulin Sliding Scale Info: 3x daily with meals and at bedtime       Current Medications (04/26/2017):  This is the current hospital active medication list Current Facility-Administered Medications  Medication Dose Route Frequency Provider Last Rate Last Dose  . 0.9 %  sodium chloride infusion   Intravenous Continuous Roderic Palau, MD   Stopped at 04/24/17 1435  . acetaminophen (TYLENOL) tablet 650 mg  650 mg Oral Q6H PRN Purohit, Konrad Dolores, MD   650 mg at 04/16/17 1010   Or  . acetaminophen (TYLENOL) suppository 650 mg  650 mg Rectal Q6H PRN Purohit, Shrey C, MD      . albuterol (PROVENTIL) (2.5 MG/3ML) 0.083% nebulizer solution 3 mL  3 mL Nebulization Q6H PRN Purohit, Shrey C, MD   3 mL at 04/19/17 0012  . buPROPion (WELLBUTRIN XL) 24 hr tablet 150 mg  150 mg Oral Daily Purohit, Shrey C, MD   150 mg at 04/26/17 0907  . camphor-menthol (SARNA) lotion   Topical PRN Samuella Cota, MD      . Chlorhexidine Gluconate Cloth 2 % PADS 6 each  6 each Topical Daily Waynetta Sandy, MD   6 each at 04/26/17 [phone]  . Darbepoetin Alfa (ARANESP) injection 100 mcg  100 mcg Intravenous Q Mon-HD Samuella Cota, MD   100 mcg at 04/21/17 1054  . diphenhydrAMINE (BENADRYL) 12.5 MG/5ML elixir 12.5 mg  12.5 mg Oral Q6H PRN Rai, Ripudeep K, MD      . diphenhydrAMINE (BENADRYL) capsule 25 mg  25 mg Oral Q6H PRN Samuella Cota, MD   25 mg at 04/25/17 1029  . doxazosin (CARDURA) tablet 2 mg  2 mg Oral QHS Mauricia Area, MD   2 mg at 04/25/17 2340  . finasteride (PROSCAR) tablet 5 mg  5 mg Oral QHS Purohit, Shrey C, MD   5 mg at 04/25/17 2339  . fluticasone (FLONASE) 50 MCG/ACT nasal spray 1 spray  1 spray Each Nare BID Purohit, Konrad Dolores, MD   1 spray at 04/26/17 0911  . insulin aspart (novoLOG) injection 0-5 Units  0-5 Units Subcutaneous QHS Elodia Florence., MD   2 Units at 04/21/17 2204  . insulin aspart (novoLOG) injection 0-9 Units  0-9 Units  Subcutaneous TID WC Elodia Florence., MD   1 Units at 04/26/17 0845  . loratadine (CLARITIN) tablet 10 mg  10 mg Oral Daily Purohit, Shrey C, MD   10 mg at 04/26/17 0907  . Melatonin TABS 4.5 mg  4.5 mg Oral QHS Samuella Cota, MD   4.5 mg at 04/25/17 2340  . metoprolol succinate (TOPROL-XL) 24 hr tablet 12.5 mg  12.5 mg Oral BID WC Deterding, Jeneen Rinks, MD      . multivitamin (RENA-VIT) tablet 1 tablet  1 tablet Oral Nile Riggs, MD   1 tablet at 04/25/17 2342  . mupirocin ointment (BACTROBAN) 2 % 1 application  1 application Nasal BID Waynetta Sandy, MD   1 application at 63/14/97 [phone]  . ondansetron (ZOFRAN) tablet 4 mg  4 mg  Oral Q6H PRN Purohit, Konrad Dolores, MD       Or  . ondansetron (ZOFRAN) injection 4 mg  4 mg Intravenous Q6H PRN Purohit, Shrey C, MD      . oxymetazoline (AFRIN) 0.05 % nasal spray 2 spray  2 spray Right Nare UD Bonnielee Haff, MD   2 spray at 04/25/17 2341  . pantoprazole (PROTONIX) EC tablet 40 mg  40 mg Oral Daily Elodia Florence., MD   40 mg at 04/26/17 1610  . polyethylene glycol (MIRALAX / GLYCOLAX) packet 17 g  17 g Oral Daily PRN Purohit, Shrey C, MD      . simvastatin (ZOCOR) tablet 20 mg  20 mg Oral QPM Purohit, Shrey C, MD   20 mg at 04/25/17 1714  . sodium chloride flush (NS) 0.9 % injection 3 mL  3 mL Intravenous Q12H Purohit, Shrey C, MD   3 mL at 04/25/17 2342  . traMADol (ULTRAM) tablet 50 mg  50 mg Oral Q12H PRN Purohit, Shrey C, MD   50 mg at 04/25/17 1714  . umeclidinium-vilanterol (ANORO ELLIPTA) 62.5-25 MCG/INH 1 puff  1 puff Inhalation Daily Purohit, Shrey C, MD   1 puff at 04/26/17 0756     Discharge Medications: acetaminophen 325 MG tablet Commonly known as:  TYLENOL Take 650 mg by mouth every 6 (six) hours as needed for mild pain.   albuterol 0.63 MG/3ML nebulizer solution Commonly known as:  ACCUNEB Take 3 mLs by nebulization every 6 (six) hours as needed for wheezing or shortness of breath.   ANORO ELLIPTA  62.5-25 MCG/INH Aepb Generic drug:  umeclidinium-vilanterol Inhale 1 puff into the lungs daily.   buPROPion 150 MG 24 hr tablet Commonly known as:  WELLBUTRIN XL Take 150 mg by mouth daily.   diphenhydrAMINE 12.5 MG/5ML elixir Commonly known as:  BENADRYL Take 5 mLs (12.5 mg total) by mouth every 6 (six) hours as needed for itching. Over the counter   doxazosin 2 MG tablet Commonly known as:  CARDURA Take 1 tablet (2 mg total) by mouth at bedtime. What changed:    medication strength  how much to take  when to take this   ferrous sulfate 325 (65 FE) MG EC tablet Take 1 tablet (325 mg total) by mouth 2 (two) times daily.   finasteride 5 MG tablet Commonly known as:  PROSCAR Take 5 mg by mouth at bedtime.   fluticasone 50 MCG/ACT nasal spray Commonly known as:  FLONASE Place 1 spray into both nostrils 2 (two) times daily.   glyBURIDE 5 MG tablet Commonly known as:  DIABETA Take 1 tablet (5 mg total) by mouth daily with breakfast. What changed:    how much to take  when to take this  additional instructions   loratadine 10 MG tablet Commonly known as:  CLARITIN Take 10 mg by mouth daily.   Melatonin 5 MG Tabs Take 5 mg by mouth at bedtime.   metoprolol succinate 25 MG 24 hr tablet Commonly known as:  TOPROL-XL Take 0.5 tablets (12.5 mg total) by mouth 2 (two) times daily with a meal. What changed:    medication strength  how much to take  when to take this  additional instructions   multivitamin Tabs tablet Take 1 tablet by mouth at bedtime.   simvastatin 20 MG tablet Commonly known as:  ZOCOR Take 20 mg by mouth every evening.   traMADol 50 MG tablet Commonly known as:  ULTRAM Take 1 tablet (50 mg  total) by mouth every 12 (twelve) hours as needed for moderate pain.   warfarin 2 MG tablet Commonly known as:  COUMADIN Take 1-2 mg by mouth See admin instructions. 1mg  by mouth once daily in the evening on Tuesday and Thursday. 2mg   by mouth once daily in the evening on Monday, Wednesday, Friday, Saturday, Sunday                  Relevant Imaging Results:  Relevant Lab Results:   Additional Information SS#: [id number]       Set up with outpatient hemodialysis at [facility] MWF second [place], [place]

## 2017-04-28 ENCOUNTER — Other Ambulatory Visit: Payer: Self-pay | Admitting: *Deleted

## 2017-04-28 DIAGNOSIS — D649 Anemia, unspecified: Secondary | ICD-10-CM | POA: Diagnosis not present

## 2017-04-28 DIAGNOSIS — I5033 Acute on chronic diastolic (congestive) heart failure: Secondary | ICD-10-CM

## 2017-04-28 DIAGNOSIS — N186 End stage renal disease: Secondary | ICD-10-CM | POA: Diagnosis not present

## 2017-04-28 DIAGNOSIS — D509 Iron deficiency anemia, unspecified: Secondary | ICD-10-CM | POA: Diagnosis not present

## 2017-04-28 NOTE — Consult Note (Signed)
[facility] Care Management follow up.  Chart reviewed. Noted Ronald [name] discharged to [facility] ALF on 3.2.19.   As instructed by [place]  Management Assistant Director, will request [name] follow up while at ALF.   Marthenia Rolling, MSN-Ed, RN,BSN [facility] Liaison [phone]

## 2017-04-29 ENCOUNTER — Other Ambulatory Visit: Payer: Self-pay

## 2017-04-29 DIAGNOSIS — I4891 Unspecified atrial fibrillation: Secondary | ICD-10-CM | POA: Diagnosis not present

## 2017-04-29 DIAGNOSIS — S42122D Displaced fracture of acromial process, left shoulder, subsequent encounter for fracture with routine healing: Secondary | ICD-10-CM | POA: Diagnosis not present

## 2017-04-29 DIAGNOSIS — I5032 Chronic diastolic (congestive) heart failure: Secondary | ICD-10-CM | POA: Diagnosis not present

## 2017-04-29 DIAGNOSIS — R296 Repeated falls: Secondary | ICD-10-CM | POA: Diagnosis not present

## 2017-04-29 DIAGNOSIS — M810 Age-related osteoporosis without current pathological fracture: Secondary | ICD-10-CM | POA: Diagnosis not present

## 2017-04-29 DIAGNOSIS — M84359S Stress fracture, hip, unspecified, sequela: Secondary | ICD-10-CM | POA: Diagnosis not present

## 2017-04-29 DIAGNOSIS — R0602 Shortness of breath: Secondary | ICD-10-CM | POA: Diagnosis not present

## 2017-04-29 DIAGNOSIS — S52591A Other fractures of lower end of right radius, initial encounter for closed fracture: Secondary | ICD-10-CM | POA: Diagnosis not present

## 2017-04-29 DIAGNOSIS — J449 Chronic obstructive pulmonary disease, unspecified: Secondary | ICD-10-CM | POA: Diagnosis not present

## 2017-04-29 DIAGNOSIS — R269 Unspecified abnormalities of gait and mobility: Secondary | ICD-10-CM | POA: Diagnosis not present

## 2017-04-29 NOTE — Patient Outreach (Signed)
[name] [facility]) Care Management  04/29/2017  Ronald [name] 15-Feb-1923 [id number]  84 year old admitted 2/14-3/2 with heart failure; new hemodialysis patient. History of heart failure, COPD,atrial fibrillation; diabetes; lower Gastro-Intestinal bleed.  Transition of care call. RNCMspoke with daughter [name] who reports client is doing well. He currently resides at [name]. He is a new dialysis patient and attends dialysis Monday/Wednesday/Friday. Per client's daughter client goes to his office every day. She reports family is currently providing all client's transportation, including HD and ask about transportation through payor benefits. She states that she is aware of [facility] services, but declines to use those services at this time. Client does not have Medicaid she states. RNCM encouraged her to call the number on the back of client's insurance with any questions regarding specific benefits.   Medications given per nurse at [facility]. Ms. Hassie Bruce reports the facility has clients discharge instructions.   Plan: home visit scheduled for next week.  [name], RN, MSN, [place] Coordinator Cell: [phone]

## 2017-05-05 ENCOUNTER — Inpatient Hospital Stay (HOSPITAL_COMMUNITY)
Admission: EM | Admit: 2017-05-05 | Discharge: 2017-05-10 | DRG: 377 | Disposition: A | Discharge status: Skilled Nursing Facility | Payer: PPO | Attending: Internal Medicine | Admitting: Internal Medicine

## 2017-05-05 ENCOUNTER — Other Ambulatory Visit: Payer: Self-pay

## 2017-05-05 ENCOUNTER — Encounter (HOSPITAL_COMMUNITY): Payer: Self-pay

## 2017-05-05 DIAGNOSIS — M810 Age-related osteoporosis without current pathological fracture: Secondary | ICD-10-CM | POA: Diagnosis present

## 2017-05-05 DIAGNOSIS — E785 Hyperlipidemia, unspecified: Secondary | ICD-10-CM | POA: Diagnosis present

## 2017-05-05 DIAGNOSIS — I5032 Chronic diastolic (congestive) heart failure: Secondary | ICD-10-CM | POA: Diagnosis not present

## 2017-05-05 DIAGNOSIS — R7881 Bacteremia: Secondary | ICD-10-CM | POA: Diagnosis not present

## 2017-05-05 DIAGNOSIS — Z7901 Long term (current) use of anticoagulants: Secondary | ICD-10-CM | POA: Diagnosis not present

## 2017-05-05 DIAGNOSIS — E1140 Type 2 diabetes mellitus with diabetic neuropathy, unspecified: Secondary | ICD-10-CM | POA: Diagnosis present

## 2017-05-05 DIAGNOSIS — R040 Epistaxis: Secondary | ICD-10-CM | POA: Diagnosis not present

## 2017-05-05 DIAGNOSIS — Z886 Allergy status to analgesic agent status: Secondary | ICD-10-CM | POA: Diagnosis not present

## 2017-05-05 DIAGNOSIS — I10 Essential (primary) hypertension: Secondary | ICD-10-CM | POA: Diagnosis not present

## 2017-05-05 DIAGNOSIS — Z9841 Cataract extraction status, right eye: Secondary | ICD-10-CM | POA: Diagnosis not present

## 2017-05-05 DIAGNOSIS — L89152 Pressure ulcer of sacral region, stage 2: Secondary | ICD-10-CM | POA: Diagnosis not present

## 2017-05-05 DIAGNOSIS — D699 Hemorrhagic condition, unspecified: Secondary | ICD-10-CM | POA: Diagnosis not present

## 2017-05-05 DIAGNOSIS — Z85828 Personal history of other malignant neoplasm of skin: Secondary | ICD-10-CM

## 2017-05-05 DIAGNOSIS — I482 Chronic atrial fibrillation: Secondary | ICD-10-CM | POA: Diagnosis not present

## 2017-05-05 DIAGNOSIS — Z720 Tobacco use: Secondary | ICD-10-CM | POA: Diagnosis not present

## 2017-05-05 DIAGNOSIS — I361 Nonrheumatic tricuspid (valve) insufficiency: Secondary | ICD-10-CM | POA: Diagnosis not present

## 2017-05-05 DIAGNOSIS — I272 Pulmonary hypertension, unspecified: Secondary | ICD-10-CM | POA: Diagnosis not present

## 2017-05-05 DIAGNOSIS — Z9842 Cataract extraction status, left eye: Secondary | ICD-10-CM | POA: Diagnosis not present

## 2017-05-05 DIAGNOSIS — L8990 Pressure ulcer of unspecified site, unspecified stage: Secondary | ICD-10-CM

## 2017-05-05 DIAGNOSIS — Z9049 Acquired absence of other specified parts of digestive tract: Secondary | ICD-10-CM

## 2017-05-05 DIAGNOSIS — N2581 Secondary hyperparathyroidism of renal origin: Secondary | ICD-10-CM | POA: Diagnosis not present

## 2017-05-05 DIAGNOSIS — X58XXXA Exposure to other specified factors, initial encounter: Secondary | ICD-10-CM | POA: Diagnosis present

## 2017-05-05 DIAGNOSIS — E8889 Other specified metabolic disorders: Secondary | ICD-10-CM | POA: Diagnosis present

## 2017-05-05 DIAGNOSIS — Z993 Dependence on wheelchair: Secondary | ICD-10-CM

## 2017-05-05 DIAGNOSIS — L299 Pruritus, unspecified: Secondary | ICD-10-CM | POA: Diagnosis not present

## 2017-05-05 DIAGNOSIS — Z961 Presence of intraocular lens: Secondary | ICD-10-CM | POA: Diagnosis not present

## 2017-05-05 DIAGNOSIS — D696 Thrombocytopenia, unspecified: Secondary | ICD-10-CM | POA: Diagnosis present

## 2017-05-05 DIAGNOSIS — K5900 Constipation, unspecified: Secondary | ICD-10-CM | POA: Diagnosis not present

## 2017-05-05 DIAGNOSIS — J449 Chronic obstructive pulmonary disease, unspecified: Secondary | ICD-10-CM | POA: Diagnosis not present

## 2017-05-05 DIAGNOSIS — Z8711 Personal history of peptic ulcer disease: Secondary | ICD-10-CM | POA: Diagnosis not present

## 2017-05-05 DIAGNOSIS — Z8249 Family history of ischemic heart disease and other diseases of the circulatory system: Secondary | ICD-10-CM

## 2017-05-05 DIAGNOSIS — Z952 Presence of prosthetic heart valve: Secondary | ICD-10-CM

## 2017-05-05 DIAGNOSIS — D649 Anemia, unspecified: Secondary | ICD-10-CM | POA: Diagnosis not present

## 2017-05-05 DIAGNOSIS — E118 Type 2 diabetes mellitus with unspecified complications: Secondary | ICD-10-CM | POA: Diagnosis present

## 2017-05-05 DIAGNOSIS — K5730 Diverticulosis of large intestine without perforation or abscess without bleeding: Secondary | ICD-10-CM | POA: Diagnosis not present

## 2017-05-05 DIAGNOSIS — J410 Simple chronic bronchitis: Secondary | ICD-10-CM

## 2017-05-05 DIAGNOSIS — Z953 Presence of xenogenic heart valve: Secondary | ICD-10-CM

## 2017-05-05 DIAGNOSIS — E1122 Type 2 diabetes mellitus with diabetic chronic kidney disease: Secondary | ICD-10-CM | POA: Diagnosis present

## 2017-05-05 DIAGNOSIS — Z79891 Long term (current) use of opiate analgesic: Secondary | ICD-10-CM

## 2017-05-05 DIAGNOSIS — N186 End stage renal disease: Secondary | ICD-10-CM | POA: Diagnosis not present

## 2017-05-05 DIAGNOSIS — K922 Gastrointestinal hemorrhage, unspecified: Secondary | ICD-10-CM | POA: Diagnosis not present

## 2017-05-05 DIAGNOSIS — R0602 Shortness of breath: Secondary | ICD-10-CM | POA: Diagnosis not present

## 2017-05-05 DIAGNOSIS — I739 Peripheral vascular disease, unspecified: Secondary | ICD-10-CM | POA: Diagnosis not present

## 2017-05-05 DIAGNOSIS — Z992 Dependence on renal dialysis: Secondary | ICD-10-CM | POA: Diagnosis not present

## 2017-05-05 DIAGNOSIS — K921 Melena: Principal | ICD-10-CM

## 2017-05-05 DIAGNOSIS — D638 Anemia in other chronic diseases classified elsewhere: Secondary | ICD-10-CM | POA: Diagnosis present

## 2017-05-05 DIAGNOSIS — Z794 Long term (current) use of insulin: Secondary | ICD-10-CM

## 2017-05-05 DIAGNOSIS — E1151 Type 2 diabetes mellitus with diabetic peripheral angiopathy without gangrene: Secondary | ICD-10-CM | POA: Diagnosis not present

## 2017-05-05 DIAGNOSIS — J432 Centrilobular emphysema: Secondary | ICD-10-CM | POA: Diagnosis not present

## 2017-05-05 DIAGNOSIS — Z7983 Long term (current) use of bisphosphonates: Secondary | ICD-10-CM

## 2017-05-05 DIAGNOSIS — A419 Sepsis, unspecified organism: Secondary | ICD-10-CM | POA: Diagnosis not present

## 2017-05-05 DIAGNOSIS — Z95820 Peripheral vascular angioplasty status with implants and grafts: Secondary | ICD-10-CM

## 2017-05-05 DIAGNOSIS — F17210 Nicotine dependence, cigarettes, uncomplicated: Secondary | ICD-10-CM | POA: Diagnosis not present

## 2017-05-05 DIAGNOSIS — S51012A Laceration without foreign body of left elbow, initial encounter: Secondary | ICD-10-CM | POA: Diagnosis present

## 2017-05-05 DIAGNOSIS — Z800 Family history of malignant neoplasm of digestive organs: Secondary | ICD-10-CM

## 2017-05-05 DIAGNOSIS — H9190 Unspecified hearing loss, unspecified ear: Secondary | ICD-10-CM | POA: Diagnosis present

## 2017-05-05 DIAGNOSIS — R1312 Dysphagia, oropharyngeal phase: Secondary | ICD-10-CM | POA: Diagnosis present

## 2017-05-05 DIAGNOSIS — R630 Anorexia: Secondary | ICD-10-CM | POA: Diagnosis present

## 2017-05-05 DIAGNOSIS — Z96612 Presence of left artificial shoulder joint: Secondary | ICD-10-CM | POA: Diagnosis present

## 2017-05-05 DIAGNOSIS — D689 Coagulation defect, unspecified: Secondary | ICD-10-CM

## 2017-05-05 DIAGNOSIS — F329 Major depressive disorder, single episode, unspecified: Secondary | ICD-10-CM | POA: Diagnosis present

## 2017-05-05 DIAGNOSIS — Z8551 Personal history of malignant neoplasm of bladder: Secondary | ICD-10-CM

## 2017-05-05 DIAGNOSIS — I120 Hypertensive chronic kidney disease with stage 5 chronic kidney disease or end stage renal disease: Secondary | ICD-10-CM | POA: Diagnosis not present

## 2017-05-05 DIAGNOSIS — Z79899 Other long term (current) drug therapy: Secondary | ICD-10-CM

## 2017-05-05 DIAGNOSIS — Z823 Family history of stroke: Secondary | ICD-10-CM

## 2017-05-05 DIAGNOSIS — Z7189 Other specified counseling: Secondary | ICD-10-CM | POA: Diagnosis not present

## 2017-05-05 DIAGNOSIS — B029 Zoster without complications: Secondary | ICD-10-CM | POA: Diagnosis present

## 2017-05-05 DIAGNOSIS — Z86010 Personal history of colonic polyps: Secondary | ICD-10-CM

## 2017-05-05 DIAGNOSIS — I132 Hypertensive heart and chronic kidney disease with heart failure and with stage 5 chronic kidney disease, or end stage renal disease: Secondary | ICD-10-CM | POA: Diagnosis not present

## 2017-05-05 DIAGNOSIS — Z9981 Dependence on supplemental oxygen: Secondary | ICD-10-CM

## 2017-05-05 DIAGNOSIS — D631 Anemia in chronic kidney disease: Secondary | ICD-10-CM | POA: Diagnosis not present

## 2017-05-05 LAB — COMPREHENSIVE METABOLIC PANEL
ALT: 13 U/L — AB (ref 17–63)
AST: 23 U/L (ref 15–41)
Albumin: 2.9 g/dL — ABNORMAL LOW (ref 3.5–5.0)
Alkaline Phosphatase: 86 U/L (ref 38–126)
Anion gap: 13 (ref 5–15)
BILIRUBIN TOTAL: 0.5 mg/dL (ref 0.3–1.2)
BUN: 36 mg/dL — ABNORMAL HIGH (ref 6–20)
CHLORIDE: 95 mmol/L — AB (ref 101–111)
CO2: 25 mmol/L (ref 22–32)
CREATININE: 6.47 mg/dL — AB (ref 0.61–1.24)
Calcium: 8.9 mg/dL (ref 8.9–10.3)
GFR, EST AFRICAN AMERICAN: 8 mL/min — AB (ref >60)
GFR, EST NON AFRICAN AMERICAN: 7 mL/min — AB (ref >60)
Glucose, Bld: 237 mg/dL — ABNORMAL HIGH (ref 65–99)
Potassium: 4.5 mmol/L (ref 3.5–5.1)
Sodium: 133 mmol/L — ABNORMAL LOW (ref 135–145)
TOTAL PROTEIN: 7 g/dL (ref 6.5–8.1)

## 2017-05-05 LAB — CBC
HCT: 31.5 % — ABNORMAL LOW (ref 39.0–52.0)
Hemoglobin: 10 g/dL — ABNORMAL LOW (ref 13.0–17.0)
MCH: 30.8 pg (ref 26.0–34.0)
MCHC: 31.7 g/dL (ref 30.0–36.0)
MCV: 96.9 fL (ref 78.0–100.0)
Platelets: 195 10*3/uL (ref 150–400)
RBC: 3.25 MIL/uL — AB (ref 4.22–5.81)
RDW: 18.1 % — ABNORMAL HIGH (ref 11.5–15.5)
WBC: 9.4 10*3/uL (ref 4.0–10.5)

## 2017-05-05 LAB — HEMOGLOBIN A1C
Hgb A1c MFr Bld: 4.8 % (ref 4.8–5.6)
Mean Plasma Glucose: 91.06 mg/dL

## 2017-05-05 LAB — TYPE AND SCREEN
ABO/RH(D): O POS
ANTIBODY SCREEN: NEGATIVE

## 2017-05-05 LAB — POC OCCULT BLOOD, ED: FECAL OCCULT BLD: POSITIVE — AB

## 2017-05-05 LAB — PROTIME-INR
INR: 3.22
Prothrombin Time: 32.7 seconds — ABNORMAL HIGH (ref 11.4–15.2)

## 2017-05-05 LAB — CBG MONITORING, ED: Glucose-Capillary: 229 mg/dL — ABNORMAL HIGH (ref 65–99)

## 2017-05-05 MED ORDER — ACETAMINOPHEN 325 MG PO TABS
650.0000 mg | ORAL_TABLET | Freq: Four times a day (QID) | ORAL | Status: DC | PRN
  Administered 2017-05-08: 650 mg via ORAL
  Filled 2017-05-05: qty 2

## 2017-05-05 MED ORDER — DOXAZOSIN MESYLATE 2 MG PO TABS
2.0000 mg | ORAL_TABLET | Freq: Every day | ORAL | Status: DC
  Administered 2017-05-06 – 2017-05-09 (×4): 2 mg via ORAL
  Filled 2017-05-05 (×5): qty 1

## 2017-05-05 MED ORDER — PANTOPRAZOLE SODIUM 40 MG IV SOLR
40.0000 mg | Freq: Two times a day (BID) | INTRAVENOUS | Status: DC
  Administered 2017-05-05 – 2017-05-06 (×2): 40 mg via INTRAVENOUS
  Filled 2017-05-05 (×2): qty 40

## 2017-05-05 MED ORDER — SODIUM CHLORIDE 0.9 % IV SOLN
INTRAVENOUS | Status: AC
  Administered 2017-05-05: via INTRAVENOUS

## 2017-05-05 MED ORDER — BUPROPION HCL ER (XL) 150 MG PO TB24
150.0000 mg | ORAL_TABLET | Freq: Every day | ORAL | Status: DC
  Administered 2017-05-06 – 2017-05-10 (×5): 150 mg via ORAL
  Filled 2017-05-05 (×5): qty 1

## 2017-05-05 MED ORDER — SIMVASTATIN 20 MG PO TABS
20.0000 mg | ORAL_TABLET | Freq: Every evening | ORAL | Status: DC
  Administered 2017-05-06 – 2017-05-09 (×4): 20 mg via ORAL
  Filled 2017-05-05 (×5): qty 1

## 2017-05-05 MED ORDER — ACETAMINOPHEN 650 MG RE SUPP: 650.0000 mg | Freq: Four times a day (QID) | RECTAL | Status: DC | PRN

## 2017-05-05 MED ORDER — METOPROLOL SUCCINATE ER 25 MG PO TB24
12.5000 mg | ORAL_TABLET | Freq: Two times a day (BID) | ORAL | Status: DC
  Administered 2017-05-06 – 2017-05-10 (×7): 12.5 mg via ORAL
  Filled 2017-05-05 (×11): qty 1

## 2017-05-05 MED ORDER — ALBUTEROL SULFATE (2.5 MG/3ML) 0.083% IN NEBU: 3.0000 mL | INHALATION_SOLUTION | Freq: Four times a day (QID) | RESPIRATORY_TRACT | Status: DC | PRN

## 2017-05-05 MED ORDER — INSULIN ASPART 100 UNIT/ML ~~LOC~~ SOLN
0.0000 [IU] | SUBCUTANEOUS | Status: DC
  Administered 2017-05-05: 3 [IU] via SUBCUTANEOUS
  Administered 2017-05-06: 2 [IU] via SUBCUTANEOUS
  Administered 2017-05-06 (×2): 1 [IU] via SUBCUTANEOUS
  Administered 2017-05-07 – 2017-05-08 (×3): 2 [IU] via SUBCUTANEOUS
  Administered 2017-05-08: 3 [IU] via SUBCUTANEOUS
  Administered 2017-05-09 (×2): 2 [IU] via SUBCUTANEOUS
  Filled 2017-05-05: qty 1

## 2017-05-05 MED ORDER — ONDANSETRON HCL 4 MG/2ML IJ SOLN: 4.0000 mg | Freq: Four times a day (QID) | INTRAMUSCULAR | Status: DC | PRN

## 2017-05-05 MED ORDER — TRAMADOL HCL 50 MG PO TABS
50.0000 mg | ORAL_TABLET | Freq: Two times a day (BID) | ORAL | Status: DC
  Administered 2017-05-06 – 2017-05-10 (×9): 50 mg via ORAL
  Filled 2017-05-05 (×9): qty 1

## 2017-05-05 MED ORDER — HYDROCODONE-ACETAMINOPHEN 5-325 MG PO TABS: 1.0000 | ORAL_TABLET | ORAL | Status: DC | PRN

## 2017-05-05 MED ORDER — ONDANSETRON HCL 4 MG PO TABS: 4.0000 mg | ORAL_TABLET | Freq: Four times a day (QID) | ORAL | Status: DC | PRN

## 2017-05-05 MED ORDER — FINASTERIDE 5 MG PO TABS
5.0000 mg | ORAL_TABLET | Freq: Every day | ORAL | Status: DC
  Administered 2017-05-06 – 2017-05-09 (×4): 5 mg via ORAL
  Filled 2017-05-05 (×5): qty 1

## 2017-05-05 NOTE — Patient Outreach (Signed)
[place] [facility]) Care Management  05/05/2017  Ronald [name] [facility] 30-Dec-1922 [id number]   Care Coordination: RNCM received return call from Dr. Keane Police nurse, Caryl Pina. RNCM discussed Concern regarding recent reports of nose bleed; dark stool, bright red blood in stool noted by family. Son-in-David also reports client with some constipation. Primary concern stated by Mr. Ash was the bleeding and client's coumadin level. Also informed Caryl Pina that client received a skin tear this morning (trying to bath self) bandaged by the staff at [facility].  Caryl Pina spoke with Dr. [name] who reports he is sending client to the hospital for further evaluation as client is also a new dialysis patient.  Plan: continue to follow.  [name], RN, MSN, [place] Coordinator Cell: [phone]

## 2017-05-05 NOTE — Patient Outreach (Signed)
[place] [facility]) Care Management   05/05/2017  Ronald [name] 06-Nov-1922 [id number]  Ronald [name] is an 84 y.o. male  Subjective: client reports he is feeling better since discharge from the hospital. He also reports that he notices that he gets short of breath easier, but states this is not new.   Objective:  BP (!) 130/52   Pulse 78   Resp 20   Ht 1.803 m ('5\' 11"'$ )   Wt 158 lb (71.7 kg)   SpO2 94%   BMI 22.04 kg/m   Review of Systems  HENT: Positive for hearing loss.   Respiratory:       Lungs clear bilaterally  Cardiovascular:       Trace edema to lower legs bilaterally. Graft to left arm.  Skin:       Bandage to left arm noted. Lower legs with discoloration noted bilaterally.    Physical Exam skin warm dry, color within normal limits,  Encounter Medications:   Outpatient Encounter Medications as of 05/05/2017  Medication Sig  . acetaminophen (TYLENOL) 325 MG tablet Take 650 mg by mouth every 6 (six) hours as needed for mild pain.  Marland Kitchen albuterol (ACCUNEB) 0.63 MG/3ML nebulizer solution Take 3 mLs by nebulization every 6 (six) hours as needed for wheezing or shortness of breath.  . Umeclidinium-Vilanterol (ANORO ELLIPTA) 62.5-25 MCG/INH AEPB Inhale 1 puff into the lungs daily.  Marland Kitchen buPROPion (WELLBUTRIN XL) 150 MG 24 hr tablet Take 150 mg by mouth daily.   . diphenhydrAMINE (BENADRYL) 12.5 MG/5ML elixir Take 5 mLs (12.5 mg total) by mouth every 6 (six) hours as needed for itching. Over the counter  . doxazosin (CARDURA) 2 MG tablet Take 1 tablet (2 mg total) by mouth at bedtime.  . ferrous sulfate 325 (65 FE) MG EC tablet Take 1 tablet (325 mg total) by mouth 2 (two) times daily.  . finasteride (PROSCAR) 5 MG tablet Take 5 mg by mouth at bedtime.   . fluticasone (FLONASE) 50 MCG/ACT nasal spray Place 1 spray into both nostrils 2 (two) times daily.   Marland Kitchen glyBURIDE (DIABETA) 5 MG tablet Take 1 tablet (5 mg total) by mouth daily with breakfast.  . loratadine  (CLARITIN) 10 MG tablet Take 10 mg by mouth daily.  . Melatonin 5 MG TABS Take 5 mg by mouth at bedtime.  . metoprolol succinate (TOPROL-XL) 25 MG 24 hr tablet Take 0.5 tablets (12.5 mg total) by mouth 2 (two) times daily with a meal.  . multivitamin (RENA-VIT) TABS tablet Take 1 tablet by mouth at bedtime.  . simvastatin (ZOCOR) 20 MG tablet Take 20 mg by mouth every evening.  . traMADol (ULTRAM) 50 MG tablet Take 1 tablet (50 mg total) by mouth every 12 (twelve) hours as needed for moderate pain.  Marland Kitchen warfarin (COUMADIN) 2 MG tablet Take 1-2 mg by mouth See admin instructions. '1mg'$  by mouth once daily in the evening on Tuesday and Thursday. '2mg'$  by mouth once daily in the evening on Monday, Wednesday, Friday, Saturday, Sunday   No facility-administered encounter medications on file as of 05/05/2017.     Functional Status:   In your present state of health, do you have any difficulty performing the following activities: 05/05/2017 04/10/2017  Hearing? Ronald [name]  Vision? N N  Difficulty concentrating or making decisions? Y N  Comment reports sometimes forgetful -  Walking or climbing stairs? Y N  Dressing or bathing? Y N  Doing errands, shopping? Y -  Comment assisted by  family -  Some recent data might be hidden    Fall/Depression Screening:    Fall Risk  05/05/2017 11/30/2014 11/30/2014  Falls in the past year? Yes Yes Yes  Number falls in past yr: 2 or more - 2 or more  Injury with Fall? Yes Yes Yes  Comment - - fracture right wrist  Risk Factor Category  High Fall Risk High Fall Risk High Fall Risk  Risk for fall due to : History of fall(s) History of fall(s);Impaired mobility History of fall(s);Impaired mobility  Follow up - Education provided;Falls prevention discussed Education provided;Falls prevention discussed   PHQ 2/9 Scores 05/05/2017 11/30/2014  PHQ - 2 Score 3 0  PHQ- 9 Score 8 -    Assessment:  84 year old admitted 2/14-3/2 with heart failure; new hemodialysis patient. History  of heart failure, COPD,atrial fibrillation; diabetes; lower Gastro-Intestinal bleed.  Present during visit was client's son-in-law Ronald [name].  Client states he is having episodes of nose bleeding, he reports his nose was bleedng last night and he woke up with blood noted on his pillow. Son-in-law reports a family member noted when he was as this house: dark stools; bright red blood in his stool. Ronald [name] also reports client having some problems with constipation. Mr Lia [name] request if an stool for occult blood can be checked at the facility. And also asked about the last time coumadin check was done and how often it is supposed to be checked.  Client reports he bathed himself this morning because it took too long for someone to come assist and some how got a skin tear on his Left arm. Dry bandage noted to area.   Ronald [name] discussed the above concerns with nurse at [facility] [place] who reports client's lab work is due to be drawn tomorrow, including a PT/INR. She also reports that client is taking iron supplements and is not taking a stool softener. Ronald [name] to discuss with client and his family.   Ronald [name] called primary care office and left Voice message with Ronald [name], Ronald [name] nurse.  Plan: continue to follow. Update primary care regarding PHQ2 and PHQ 9 score.  Ronald [name] Care Plan Problem One     Most Recent Value  Care Plan Problem One  at risk for readmission as evidence by recent admission  Role Documenting the Problem One  Care Management [place] for Problem One  Active  [facility] Long Term Goal   client will not be readmitted within the next 31-60 days.  Ronald Long Term Goal Start Date  04/29/17  Interventions for Problem One Long Term Goal  home visit completed, discussed and reviewed current signs/symptoms, reviewed signs/symptoms client may experience if having an exacerbation of heart failure, care coordination with Assisted living facility nurse and call primary care provider to  update.  Ronald [name] Short Term Goal #1   client will attend scheduled follow up appointments within the next 30 days.  Ronald [name] Short Term Goal #1 Start Date  04/29/17  Interventions for Short Term Goal #1  reviewed upcoming appointments with client and son-in law, provided [facility] calender/organizer and discussed how to use.  Ronald [name] Short Term Goal #2   client/daughter will verbalize long term transportation plan for dialysis treatment within the next 30 days.  Ronald [name] Short Term Goal #2 Start Date  04/29/17  [facility] [name] Short Term Goal #2 Met Date  05/05/17  Interventions for Short Term Goal #2  discused back up transportation plan with  son-in law who states that family will continue to take client to dialysis. She states their back-up plan if family is not available to take client is peoples choice transportation.  Ronald [name] Short Term Goal #3  at risk for bleeding due to medications, history of GI bleeding.  Ronald [name] Short Term Goal #3 Start Date  05/05/17  Interventions for Short [place]ern Goal #3  care coordination with nurse at facility and provider office.       [name], RN, MSN, [place] Coordinator Cell: [phone]

## 2017-05-05 NOTE — ED Provider Notes (Signed)
[facility] EMERGENCY DEPARTMENT Provider Note   CSN: [id number] Arrival date & time: 05/05/17  1319     History   Chief Complaint Chief Complaint  Patient presents with  . Rectal Bleeding    HPI Ronald [name] is a 84 y.o. male.  HPI  84 year old male with a history of chronic atrial fibrillation on warfarin, chronic diastolic heart failure, bovine aortic valve replacement and recent admission for GI bleeding presents with recurrent rectal bleeding.  History is mostly taken from the son as the patient has extremely hard of hearing.  The patient has been having dark black stools for at least a few days and possibly weeks.  It is hard to get a clear picture on exactly how long.  However the patient reiterates to me that this has been going on since his last admission when he was discharged on 3/2.  However over this past weekend he is been having bright red blood when having a bowel movement.  This was noticed by his niece who is an Therapist, sports.  At the time of last admission his warfarin was discontinued but he is back on it.  He also was recently started on dialysis his last admission.  He is a Monday/Wednesday/Friday dialysis patient and missed today because he came in here for evaluation after talking to his PCP.  He has been short of breath but this is been for weeks.  He denies chest pain or abdominal pain.  Past Medical History:  Diagnosis Date  . AS (aortic stenosis)    bovine aortic valve replacement 01/07/11  . Bladder cancer [facility])    Bladder Cancer local  . Blood transfusion    w/hip operation  . Cellulitis of left lower extremity   . Chronic atrial fibrillation ([place])   . Chronic diastolic congestive heart failure ([place])   . Claudication in peripheral vascular disease ([place]) 06/17/2011  . COPD (chronic obstructive pulmonary disease) ([place])   . Depression wife died 4 years ago.    Marland Kitchen History of stomach ulcers ~ 1951  . HTN (hypertension)   . Neuropathy due to  secondary diabetes ([place])   . Peripheral vascular disease ([place]) very poor circulation legs and feet ... stents right and left legs... done in dr j. Gwenlyn Found 's office.   . Pneumonia 07/25/11   left  . Recurrent upper respiratory infection (URI)    sinusitis  . Renal artery stenosis ([place]) 2006   renal artery stent  . S/P angioplasty with stent, diamond back rotational athrectomy Prox. Rt. SFA 06/17/2011 06/17/2011  . Shortness of breath   . Thrombocytopenia due to drugs    seen by Dr Inda Merlin plts [id number] no rx  . Type II diabetes mellitus [facility])     Patient Active Problem List   Diagnosis Date Noted  . ESRD (end stage renal disease) ([place]) 05/05/2017  . Acute on chronic congestive heart failure ([place])   . Acute renal failure superimposed on stage 3 chronic kidney disease ([place])   . Goals of care, counseling/discussion   . DNR (do not resuscitate) discussion   . Palliative care encounter   . Acute respiratory distress 03/10/2017  . Skin excoriation 03/10/2017  . Closed fracture of left proximal humerus 12/26/2016  . Comminuted left humeral fracture 12/23/2016  . GI bleed 07/02/2016  . Lactic acidemia 07/02/2016  . Lower GI bleed   . Tobacco use 03/07/2015  . Diabetes mellitus type 2, uncontrolled ([place]) 12/20/2013  . Acute blood loss anemia 12/20/2013  .  Allergic rhinitis 12/20/2013  . Intertrochanteric fracture of left femur ([place]) 12/12/2013  . Anemia 12/12/2013  . Right acetabular fracture ([place]) 04/11/2013  . Acetabular fracture ([place]) 04/11/2013  . Bilateral claudication of lower limb ([place]) 12/24/2012  . Cellulitis 12/02/2012  . History of tobacco abuse- 75 years, quit in Feb 2014 07/06/2012  . Acute on chronic diastolic (congestive) heart failure ([name]) 07/06/2012  . COPD (chronic obstructive pulmonary disease) ([place]) 05/27/2012  . Pulmonary nodules 05/27/2012  . Normal coronary arteries, cath 11/12 07/25/2011  . Normal left ventricular systolic function, Echo 9/50 07/25/2011  .  Closed right hip fracture, ORIF 10/28/24 complicated by gluteal hematoma while being Coumadinized post op 06/27/2011  . Aspirin allergy, rash, SOB. Pt is on Plavix 06/19/2011  . Chronic atrial fibrillation ([place]) 06/19/2011  . Thrombocytopenia ([place]) 01/11/2011  . Leukocytosis 01/11/2011  . S/P aortic valve replacement: #23 Magna Ease Edwards Pericardial Valve  November 2012 01/08/2011  . AS (aortic stenosis)- s/p tissue AVR 01/07/11   . CAROTID BRUIT- moderate ICA disease 5/14 05/31/2008  . Diabetes mellitus with complication ([place]) 71/24/5809  . Hyperlipidemia 07/11/2007  . DEPRESSION 07/11/2007  . RESTLESS LEGS SYNDROME 07/11/2007  . Essential hypertension 07/11/2007  . PVD, Rt SFA PTA/HSRA 06/17/11 07/11/2007  . DIVERTICULOSIS, COLON 07/11/2007  . RENAL CYST 07/11/2007  . ARTHRITIS 07/11/2007  . SKIN CANCER, HX OF 07/11/2007  . RHEUMATIC HEART DISEASE, HX OF 07/11/2007    Past Surgical History:  Procedure Laterality Date  . ABDOMINAL ANGIOGRAM  06/17/2011   Procedure: ABDOMINAL ANGIOGRAM;  Surgeon: Lorretta Harp, MD;  [facility] C[facility] LAB;  Service: Cardiovascular;;  . AORTIC VALVE REPLACEMENT  01/07/2011   Procedure: AORTIC VALVE REPLACEMENT (AVR);  Surgeon: Grace Isaac, MD;  Location: [place];  Service: Open Heart Surgery;  Laterality: N/A;; magna-ease bovine 19mm bioprosthesis  . ATHERECTOMY N/A 06/17/2011   Procedure: ATHERECTOMY;  Surgeon: Lorretta Harp, MD;  Location[facility] CATH LAB;  Service: Cardiovascular;  Laterality: N/A;  . AV FISTULA PLACEMENT Left 04/24/2017   Procedure: INSERTION OF ARTERIOVENOUS (AV) GORE-TEX GRAFT ARM;  Surgeon: Conrad , MD;  Location: [place];  Service: Vascular;  Laterality: Left;  . CARDIAC CATHETERIZATION  11/19/10   normal coronaries, mod AS, 75% l RAS  . CARDIOVERSION  04/03/2011   Procedure: CARDIOVERSION;  Surgeon: Leonie Man, MD;  Location: [facility];  Service: Cardiovascular;  Laterality: N/A;  . CATARACT EXTRACTION W/ INTRAOCULAR  LENS  IMPLANT, BILATERAL  ~ 2007  . CHOLECYSTECTOMY    . FEMUR IM NAIL  06/27/2011   Procedure: INTRAMEDULLARY (IM) NAIL FEMORAL;  Surgeon: Marin Shutter, MD;  Location: [facility];  Service: Orthopedics;  Laterality: Right;  . FEMUR IM NAIL Left 12/14/2013   Procedure: INTRAMEDULLARY (IM) NAIL FEMORAL;  Surgeon: Mauri Pole, MD;  Location: [facility];  Service: Orthopedics;  Laterality: Left;  . FRACTURE SURGERY    . IR FLUORO GUIDE CV LINE LEFT  04/17/2017  . IR US GUIDE VASC ACCESS LEFT  04/17/2017  . LOWER EXTREMITY ANGIOGRAM  06/17/2011   diamondback orbital rotational and cutting balloon atherectomy of the prox R SFA  . LOWER EXTREMITY ANGIOGRAM Bilateral 06/17/2011   Procedure: LOWER EXTREMITY ANGIOGRAM;  Surgeon: Lorretta Harp, MD;  Location: [facility] CATH LAB;  Service: Cardiovascular;  Laterality: Bilateral;  . PERIPHERAL ARTERIAL STENT GRAFT     2006 left anf right illiac stents Dr Deon Pilling  . RENAL ARTERY STENT  2006   "I believe"  .  REVERSE SHOULDER ARTHROPLASTY Left 12/31/2016   Procedure: LEFT REVERSE SHOULDER ARTHROPLASTY;  Surgeon: Nicholes Stairs, MD;  Location: [place];  Service: Orthopedics;  Laterality: Left;  . TONSILLECTOMY AND ADENOIDECTOMY     "when I was a kid"       Home Medications    Prior to Admission medications   Medication Sig Start Date End Date Taking? Authorizing Provider  acetaminophen (TYLENOL) 325 MG tablet Take 650 mg by mouth every 6 (six) hours as needed ("for pain score 1-4; not to exceed #8 tablets in 24 hours").    Yes [provider]  albuterol (ACCUNEB) 0.63 MG/3ML nebulizer solution Take 3 mLs by nebulization every 6 (six) hours as needed for wheezing or shortness of breath.   Yes [provider]  buPROPion (WELLBUTRIN XL) 150 MG 24 hr tablet Take 150 mg by mouth daily.    Yes [provider]  diphenhydrAMINE (BENADRYL) 12.5 MG/5ML elixir Take 5 mLs (12.5 mg total) by mouth every 6 (six) hours as needed for itching. Over  the counter Patient taking differently: Take 12.5 mg by mouth every 6 (six) hours as needed for itching.  04/25/17  Yes Rai, Ripudeep K, MD  doxazosin (CARDURA) 2 MG tablet Take 1 tablet (2 mg total) by mouth at bedtime. 04/25/17  Yes Rai, Ripudeep K, MD  ferrous sulfate 325 (65 FE) MG EC tablet Take 1 tablet (325 mg total) by mouth 2 (two) times daily. 07/04/16 07/04/17 Yes Lavina Hamman, MD  finasteride (PROSCAR) 5 MG tablet Take 5 mg by mouth at bedtime.    Yes [provider]  fluticasone (FLONASE) 50 MCG/ACT nasal spray Place 1 spray into both nostrils 2 (two) times daily.    Yes [provider]  glipiZIDE (GLUCOTROL) 5 MG tablet Take 5 mg by mouth daily before breakfast.   Yes [provider]  loratadine (CLARITIN) 10 MG tablet Take 10 mg by mouth daily.   Yes [provider]  Melatonin 5 MG TABS Take 5 mg by mouth at bedtime.   Yes [provider]  metoprolol succinate (TOPROL-XL) 25 MG 24 hr tablet Take 0.5 tablets (12.5 mg total) by mouth 2 (two) times daily with a meal. 04/26/17  Yes Rai, Ripudeep K, MD  Multiple Vitamins-Minerals (MULTIVITAMIN WITH MINERALS) tablet Take 1 tablet by mouth at bedtime.   Yes [provider]  OXYGEN Inhale 2 L into the lungs continuous.   Yes [provider]  simvastatin (ZOCOR) 20 MG tablet Take 20 mg by mouth every evening.   Yes [provider]  traMADol (ULTRAM) 50 MG tablet Take 1 tablet (50 mg total) by mouth every 12 (twelve) hours as needed for moderate pain. Patient taking differently: Take 50 mg by mouth 2 (two) times daily.  04/25/17  Yes Rai, Ripudeep K, MD  Umeclidinium-Vilanterol (ANORO ELLIPTA) 62.5-25 MCG/INH AEPB Inhale 1 puff into the lungs daily.   Yes [provider]  warfarin (COUMADIN) 2 MG tablet Take 1-2 mg by mouth See admin instructions. Take 2 mg by mouth in the evening on Sun/Mon/Wed/Fri/Sat and 1 mg on Tues/Thurs 12/15/16  Yes [provider]    glyBURIDE (DIABETA) 5 MG tablet Take 1 tablet (5 mg total) by mouth daily with breakfast. Patient not taking: Reported on 05/05/2017 04/25/17   Rai, Vernelle Emerald, MD  multivitamin (RENA-VIT) TABS tablet Take 1 tablet by mouth at bedtime. Patient not taking: Reported on 05/05/2017 04/25/17   Mendel Corning, MD  Family History Family History  Problem Relation Age of Onset  . Heart disease Mother   . Cancer Brother        colon  . Stroke Father     Social History Social History   Tobacco Use  . Smoking status: Current Every Day Smoker    Packs/day: 1.50    Years: 75.00    Pack years: 112.50    Types: Cigarettes  . Smokeless tobacco: Never Used  Substance Use Topics  . Alcohol use: No    Alcohol/week: 1.2 oz    Types: 2 Cans of beer per week  . Drug use: No     Allergies   Aspirin and Tape   Review of Systems Review of Systems  Respiratory: Positive for shortness of breath.   Cardiovascular: Negative for chest pain.  Gastrointestinal: Positive for blood in stool and constipation. Negative for abdominal pain.  All other systems reviewed and are negative.    Physical Exam Updated Vital Signs BP 139/62 (BP Location: Right Arm)   Pulse 84   Temp 98.2 F (36.8 C) (Oral)   Resp 20   SpO2 99%   Physical Exam  Constitutional: He is oriented to person, place, and time. He appears well-developed and well-nourished. No distress.  HENT:  Head: Normocephalic and atraumatic.  Right Ear: External ear normal.  Left Ear: External ear normal.  Nose: Nose normal.  Eyes: Right eye exhibits no discharge. Left eye exhibits no discharge.  Neck: Neck supple.  Cardiovascular: Normal rate, regular rhythm and normal heart sounds.  Pulmonary/Chest: Effort normal and breath sounds normal.  Abdominal: Soft. He exhibits no distension. There is no tenderness.  Genitourinary: Rectal exam shows guaiac positive stool (black stool). Rectal exam shows no external hemorrhoid, no internal  hemorrhoid and no mass.  Musculoskeletal: He exhibits no edema.  Skin tear to left elbow. Normal ROM.  Staples in place with maturing fistula  Neurological: He is alert and oriented to person, place, and time.  Skin: Skin is warm and dry. He is not diaphoretic.  Nursing note and vitals reviewed.    ED Treatments / Results  Labs (all labs ordered are listed, but only abnormal results are displayed) Labs Reviewed  COMPREHENSIVE METABOLIC PANEL - Abnormal; Notable for the following components:      Result Value   Sodium 133 (*)    Chloride 95 (*)    Glucose, Bld 237 (*)    BUN 36 (*)    Creatinine, Ser 6.47 (*)    Albumin 2.9 (*)    ALT 13 (*)    GFR calc non Af Amer 7 (*)    GFR calc Af Amer 8 (*)    All other components within normal limits  CBC - Abnormal; Notable for the following components:   RBC 3.25 (*)    Hemoglobin 10.0 (*)    HCT 31.5 (*)    RDW 18.1 (*)    All other components within normal limits  PROTIME-INR - Abnormal; Notable for the following components:   Prothrombin Time 32.7 (*)    All other components within normal limits  POC OCCULT BLOOD, ED - Abnormal; Notable for the following components:   Fecal Occult Bld POSITIVE (*)    All other components within normal limits  CBG MONITORING, ED - Abnormal; Notable for the following components:   Glucose-Capillary 229 (*)    All other components within normal limits  HEMOGLOBIN A1C  URINALYSIS, ROUTINE W REFLEX MICROSCOPIC  MAGNESIUM  PHOSPHORUS  TSH  COMPREHENSIVE METABOLIC PANEL  CBC  VITAMIN B12  FOLATE  IRON AND TIBC  FERRITIN  RETICULOCYTES  TYPE AND SCREEN    EKG  EKG Interpretation None       Radiology No results found.  Procedures Procedures (including critical care time)  Medications Ordered in ED Medications  insulin aspart (novoLOG) injection 0-9 Units (0 Units Subcutaneous Hold 05/05/17 2358)  albuterol (PROVENTIL) (2.5 MG/3ML) 0.083% nebulizer solution 3 mL (not  administered)  buPROPion (WELLBUTRIN XL) 24 hr tablet 150 mg (150 mg Oral Not Given 05/05/17 2314)  doxazosin (CARDURA) tablet 2 mg (2 mg Oral Not Given 05/05/17 2314)  finasteride (PROSCAR) tablet 5 mg (5 mg Oral Not Given 05/05/17 2314)  metoprolol succinate (TOPROL-XL) 24 hr tablet 12.5 mg (12.5 mg Oral Not Given 05/05/17 2314)  simvastatin (ZOCOR) tablet 20 mg (20 mg Oral Not Given 05/05/17 2315)  traMADol (ULTRAM) tablet 50 mg (50 mg Oral Not Given 05/05/17 2315)  acetaminophen (TYLENOL) tablet 650 mg (not administered)    Or  acetaminophen (TYLENOL) suppository 650 mg (not administered)  HYDROcodone-acetaminophen (NORCO/VICODIN) 5-325 MG per tablet 1-2 tablet (not administered)  ondansetron (ZOFRAN) tablet 4 mg (not administered)    Or  ondansetron (ZOFRAN) injection 4 mg (not administered)  0.9 %  sodium chloride infusion ( Intravenous New Bag/Given 05/05/17 2352)  pantoprazole (PROTONIX) injection 40 mg (40 mg Intravenous Given 05/05/17 2352)     Initial Impression / Assessment and Plan / ED Course  I have reviewed the triage vital signs and the nursing notes.  Pertinent labs & imaging results that were available during my care of the patient were reviewed by me and considered in my medical decision making (see chart for details).     While patient's initial hemoglobin is okay at 10, he will need further evaluation for GI bleeding given his reported bright red blood in rectum and supratherapeutic INR.  He is not unstable and I do not think emergent reversal is needed.  However we discussed and he will need discussion by medicine and cardiology about the necessity of warfarin in the setting of melena for several weeks.  Otherwise he appears stable for an admission to the hospitalist service.  CHA2DS2/VAS Stroke Risk Points      5 >= 2 Points: High Risk  1 - 1.99 Points: Medium Risk  0 Points: Low Risk    The patient's score has not changed in the past year.:  No Change     Details      This score determines the patient's risk of having a stroke if the  patient has atrial fibrillation.       Points Metrics  1 Has Congestive Heart Failure:  Yes   0 Has Vascular Disease:  No   1 Has Hypertension:  Yes   2 Age:  21   1 Has Diabetes:  Yes   0 Had Stroke:  No  Had TIA:  No  Had thromboembolism:  No   0 Male:  No             Final Clinical Impressions(s) / ED Diagnoses   Final diagnoses:  Melena    ED Discharge Orders    None       Sherwood Gambler, MD 05/06/17 [phone]

## 2017-05-05 NOTE — ED Notes (Signed)
Dr. Goldston at bedside at this time.  

## 2017-05-05 NOTE — H&P (Signed)
Ronald [name] DPO:[id number] DOB: 02/29/1922 DOA: 05/05/2017     PCP: [name], MD   Outpatient Specialists: nephrologist, Cardiology Dr. Nadyne Coombes  Patient coming from:  From facility [facility]  Chief Complaint: melena  HPI: Ronald [name] is a 84 y.o. male with medical history significant of a.fib on coumadin, ESRD on HD on MWF at [facility], chronic diastolic CHF, bioprosthetic AVR, COPD, HTN, DM2, Gi bleed.    Presented with melena  Last week relative was helping him in the bathroom and noted black stools. Its unclear for how long this has been happening.    He took a shower today by himself and had a minor cut on his foot but denies a fall. Denies any chest pain has shortness of breath mainly with exertion. No fever or chills. Reports that his stool has been black for 1 month no bright blood in urine.   End of February admitted for tarry stools at that time had elevated INR up to 4 his Coumadin was discontinued and GI recommended no intervention at that time.  He developed AKI initially palliative care was consulted but after discussion with family and patient he was started on hemodialysis on 21 February.  During that admission GN workup was completed.  Renal ultrasound showed no obstruction and hydronephrosis patient continued to be on hemodialysis and underwent permanent access placement on  28 February.  Recently was diagnosed with acute on chronic CHF exacerbation echogram done in January 2019 showed preserved EF but evidence of moderate pulmonary hypertension with volume was managed through hemodialysis.     While in ER:  Significant initial  Findings: NA 133 Cr 6.47 alb 2.9 Hg 10 up from 8.6 10 days ago INR 3.22     IN ER:  Temp (24hrs), Avg:98.2 F (36.8 C), Min:97.8 F (36.6 C), Max:98.6 F (37 C)      on arrival  ED Triage Vitals [05/05/17 1352]  Enc Vitals Group     BP 135/65     Pulse Rate 89     Resp (!) 22     Temp 98.6 F (37  C)     Temp Source Oral     SpO2 98 %     Weight      Height      Head Circumference      Peak Flow      Pain Score      Pain Loc      Pain Edu?      Excl. in [place]?     Latest RR 16 sat 100% HR 80 BP 155/60  Following Medications were ordered in ER: Medications - No data to display    Hospitalist was called for admission for Melena on coumadin   Review of Systems:    Pertinent positives include: melena, blood in stool  Constitutional:  No weight loss, night sweats, Fevers, chills, fatigue, weight loss  HEENT:  No headaches, Difficulty swallowing,Tooth/dental problems,Sore throat,  No sneezing, itching, ear ache, nasal congestion, post nasal drip,  Cardio-vascular:  No chest pain, Orthopnea, PND, anasarca, dizziness, palpitations.no Bilateral lower extremity swelling  GI:  No heartburn, indigestion, abdominal pain, nausea, vomiting, diarrhea, change in bowel habits, loss of appetite,   hematemesis Resp:  no shortness of breath at rest. No dyspnea on exertion, No excess mucus, no productive cough, No non-productive cough, No coughing up of blood.No change in color of mucus.No wheezing. Skin:  no rash or lesions. No jaundice GU:  no  dysuria, change in color of urine, no urgency or frequency. No straining to urinate.  No flank pain.  Musculoskeletal:  No joint pain or no joint swelling. No decreased range of motion. No back pain.  Psych:  No change in mood or affect. No depression or anxiety. No memory loss.  Neuro: no localizing neurological complaints, no tingling, no weakness, no double vision, no gait abnormality, no slurred speech, no confusion  As per HPI otherwise 10 point review of systems negative.   Past Medical History: Past Medical History:  Diagnosis Date  . AS (aortic stenosis)    bovine aortic valve replacement 01/07/11  . Bladder cancer [facility])    Bladder Cancer local  . Blood transfusion    w/hip operation  . Cellulitis of left lower extremity   .  Chronic atrial fibrillation ([place])   . Chronic diastolic congestive heart failure ([place])   . Claudication in peripheral vascular disease ([place]) 06/17/2011  . COPD (chronic obstructive pulmonary disease) ([place])   . Depression wife died 4 years ago.    Marland Kitchen History of stomach ulcers ~ 1951  . HTN (hypertension)   . Neuropathy due to secondary diabetes ([place])   . Peripheral vascular disease ([place]) very poor circulation legs and feet ... stents right and left legs... done in dr j. Gwenlyn Found 's office.   . Pneumonia 07/25/11   left  . Recurrent upper respiratory infection (URI)    sinusitis  . Renal artery stenosis ([place]) 2006   renal artery stent  . S/P angioplasty with stent, diamond back rotational athrectomy Prox. Rt. SFA 06/17/2011 06/17/2011  . Shortness of breath   . Thrombocytopenia due to drugs    seen by Dr Inda Merlin plts [id number] no rx  . Type II diabetes mellitus ([place])    Past Surgical History:  Procedure Laterality Date  . ABDOMINAL ANGIOGRAM  06/17/2011   Procedure: ABDOMINAL ANGIOGRAM;  Surgeon: Lorretta Harp, MD;  Location: [facility] CATH LAB;  Service: Cardiovascular;;  . AORTIC VALVE REPLACEMENT  01/07/2011   Procedure: AORTIC VALVE REPLACEMENT (AVR);  Surgeon: Grace Isaac, MD;  Location: [place];  Service: Open Heart Surgery;  Laterality: N/A;; magna-ease bovine 87mm bioprosthesis  . ATHERECTOMY N/A 06/17/2011   Procedure: ATHERECTOMY;  Surgeon: Lorretta Harp, MD;  Location: [facility] CATH LAB;  Service: Cardiovascular;  Laterality: N/A;  . AV FISTULA PLACEMENT Left 04/24/2017   Procedure: INSERTION OF ARTERIOVENOUS (AV) GORE-TEX GRAFT ARM;  Surgeon: Conrad Greenbrier, MD;  Location: [place];  Service: Vascular;  Laterality: Left;  . CARDIAC CATHETERIZATION  11/19/10   normal coronaries, mod AS, 75% l RAS  . CARDIOVERSION  04/03/2011   Procedure: CARDIOVERSION;  Surgeon: Leonie Man, MD;  Location: [place];  Service: Cardiovascular;  Laterality: N/A;  . CATARACT EXTRACTION W/ INTRAOCULAR LENS  IMPLANT,  BILATERAL  ~ 2007  . CHOLECYSTECTOMY    . FEMUR IM NAIL  06/27/2011   Procedure: INTRAMEDULLARY (IM) NAIL FEMORAL;  Surgeon: Marin Shutter, MD;  Location: [facility];  Service: Orthopedics;  Laterality: Right;  . FEMUR IM NAIL Left 12/14/2013   Procedure: INTRAMEDULLARY (IM) NAIL FEMORAL;  Surgeon: Mauri Pole, MD;  Location: [facility];  Service: Orthopedics;  Laterality: Left;  . FRACTURE SURGERY    . IR FLUORO GUIDE CV LINE LEFT  04/17/2017  . IR US GUIDE VASC ACCESS LEFT  04/17/2017  . LOWER EXTREMITY ANGIOGRAM  06/17/2011   diamondback orbital rotational and cutting balloon atherectomy of the prox R SFA  .  LOWER EXTREMITY ANGIOGRAM Bilateral 06/17/2011   Procedure: LOWER EXTREMITY ANGIOGRAM;  Surgeon: Lorretta Harp, MD;  Location: [facility] CATH LAB;  Service: Cardiovascular;  Laterality: Bilateral;  . PERIPHERAL ARTERIAL STENT GRAFT     2006 left anf right illiac stents Dr Deon Pilling  . RENAL ARTERY STENT  2006   "I believe"  . REVERSE SHOULDER ARTHROPLASTY Left 12/31/2016   Procedure: LEFT REVERSE SHOULDER ARTHROPLASTY;  Surgeon: Nicholes Stairs, MD;  Location: [place];  Service: Orthopedics;  Laterality: Left;  . TONSILLECTOMY AND ADENOIDECTOMY     "when I was a kid"     Social History:  Ambulatory  wheelchair bound     reports that he has been smoking cigarettes.  He has a 112.50 pack-year smoking history. he has never used smokeless tobacco. He reports that he does not drink alcohol or use drugs.  Allergies:   Allergies  Allergen Reactions  . Aspirin Anaphylaxis, Shortness Of Breath and Rash    "broke out in white welts; red blotches neck and face; windpipe closing; ~ 1962"       Family History:   Family History  Problem Relation Age of Onset  . Heart disease Mother   . Cancer Brother        colon  . Stroke Father     Medications: Prior to Admission medications   Medication Sig Start Date End Date Taking? Authorizing Provider  acetaminophen (TYLENOL) 325 MG tablet  Take 650 mg by mouth every 6 (six) hours as needed for mild pain.    [provider]  albuterol (ACCUNEB) 0.63 MG/3ML nebulizer solution Take 3 mLs by nebulization every 6 (six) hours as needed for wheezing or shortness of breath.    [provider]  buPROPion (WELLBUTRIN XL) 150 MG 24 hr tablet Take 150 mg by mouth daily.     [provider]  diphenhydrAMINE (BENADRYL) 12.5 MG/5ML elixir Take 5 mLs (12.5 mg total) by mouth every 6 (six) hours as needed for itching. Over the counter 04/25/17   Rai, Vernelle Emerald, MD  doxazosin (CARDURA) 2 MG tablet Take 1 tablet (2 mg total) by mouth at bedtime. 04/25/17   Rai, Ripudeep K, MD  ferrous sulfate 325 (65 FE) MG EC tablet Take 1 tablet (325 mg total) by mouth 2 (two) times daily. 07/04/16 07/04/17  Lavina Hamman, MD  finasteride (PROSCAR) 5 MG tablet Take 5 mg by mouth at bedtime.     [provider]  fluticasone (FLONASE) 50 MCG/ACT nasal spray Place 1 spray into both nostrils 2 (two) times daily.     [provider]  glyBURIDE (DIABETA) 5 MG tablet Take 1 tablet (5 mg total) by mouth daily with breakfast. 04/25/17   Rai, Ripudeep K, MD  loratadine (CLARITIN) 10 MG tablet Take 10 mg by mouth daily.    [provider]  Melatonin 5 MG TABS Take 5 mg by mouth at bedtime.    [provider]  metoprolol succinate (TOPROL-XL) 25 MG 24 hr tablet Take 0.5 tablets (12.5 mg total) by mouth 2 (two) times daily with a meal. 04/26/17   Rai, Ripudeep K, MD  multivitamin (RENA-VIT) TABS tablet Take 1 tablet by mouth at bedtime. 04/25/17   Rai, Vernelle Emerald, MD  simvastatin (ZOCOR) 20 MG tablet Take 20 mg by mouth every evening.    [provider]  traMADol (ULTRAM) 50 MG tablet Take 1 tablet (50 mg total) by mouth every 12 (twelve) hours as needed for moderate pain.  04/25/17   Rai, Ripudeep K, MD  Umeclidinium-Vilanterol (ANORO ELLIPTA) 62.5-25 MCG/INH AEPB Inhale 1 puff into the lungs daily.    [provider]  warfarin (COUMADIN) 2 MG tablet Take 1-2 mg by mouth See admin instructions. 1mg  by mouth once daily in the evening on Tuesday and Thursday. 2mg  by mouth once daily in the evening on Monday, Wednesday, Friday, Saturday, Sunday 12/15/16   [provider]    Physical Exam: Patient Vitals for the past 24 hrs:  BP Temp Temp src Pulse Resp SpO2  05/05/17 1800 (!) 143/51 - - 91 - 94 %  05/05/17 1654 (!) 143/57 - - 84 16 99 %  05/05/17 1538 (!) 125/97 - - 89 16 100 %  05/05/17 1356 (!) 129/59 97.8 F (36.6 C) Oral 93 18 98 %  05/05/17 1352 135/65 98.6 F (37 C) Oral 89 (!) 22 98 %    1. General:  in No Acute distress  well -appearing 2. Psychological: Alert and   Oriented 3. Head/ENT:     Dry Mucous Membranes                          Head Non traumatic, neck supple                           Poor Dentition 4. SKIN:  decreased Skin turgor,  Skin clean Dry and intact no rash, abrasion  5. Heart: Regular rate and rhythm no  Murmur, no Rub or gallop 6. Lungs:  no wheezes or crackles   7. Abdomen: Soft,  non-tender, Non distended   8. Lower extremities: no clubbing, cyanosis, or edema 9. Neurologically Grossly intact, moving all 4 extremities equally   10. MSK: Normal range of motion   body mass index is unknown because there is no height or weight on file.  Labs on Admission:   Labs on Admission: I have personally reviewed following labs and imaging studies  CBC: Recent Labs  Lab 05/05/17 1400  WBC 9.4  HGB 10.0*  HCT 31.5*  MCV 96.9  PLT 570   Basic Metabolic Panel: Recent Labs  Lab 05/05/17 1400  NA 133*  K 4.5  CL 95*  CO2 25  GLUCOSE 237*  BUN 36*  CREATININE 6.47*  CALCIUM 8.9   GFR: Estimated Creatinine Clearance: 7.2 mL/min (A) (by C-G formula based on SCr of 6.47 mg/dL (H)). Liver Function Tests: Recent Labs  Lab 05/05/17 1400  AST 23  ALT 13*  ALKPHOS 86  BILITOT 0.5  PROT 7.0  ALBUMIN 2.9*   No results for input(s):  LIPASE, AMYLASE in the last 168 hours. No results for input(s): AMMONIA in the last 168 hours. Coagulation Profile: Recent Labs  Lab 05/05/17 1400  INR 3.22   Cardiac Enzymes: No results for input(s): CKTOTAL, CKMB, CKMBINDEX, TROPONINI in the last 168 hours. BNP (last 3 results) No results for input(s): PROBNP in the last 8760 hours. HbA1C: No results for input(s): HGBA1C in the last 72 hours. CBG: No results for input(s): GLUCAP in the last 168 hours. Lipid Profile: No results for input(s): CHOL, HDL, LDLCALC, TRIG, CHOLHDL, LDLDIRECT in the last 72 hours. Thyroid Function Tests: No results for input(s): TSH, T4TOTAL, FREET4, T3FREE, THYROIDAB in the last 72 hours. Anemia Panel: No results for input(s): VITAMINB12, FOLATE, FERRITIN, TIBC, IRON, RETICCTPCT in the last 72 hours. Urine analysis: Sepsis Labs: @LABRCNTIP (procalcitonin:4,lacticidven:4) )No results found for  this or any previous visit (from the past 240 hour(s)).   UA not ordered  Lab Results  Component Value Date   HGBA1C 5.0 03/10/2017    Estimated Creatinine Clearance: 7.2 mL/min (A) (by C-G formula based on SCr of 6.47 mg/dL (H)).  BNP (last 3 results) No results for input(s): PROBNP in the last 8760 hours.   ECG REPORT Not ordered  There were no vitals filed for this visit.   Cultures:    Component Value Date/Time   SDES  04/15/2017 0201    URINE, RANDOM Performed at [facility] [address], [place], [place] [address]    SPECREQUEST  04/15/2017 0201    NONE Performed at [facility],  [address], [place], [place] [address]    CULT MULTIPLE SPECIES PRESENT, SUGGEST RECOLLECTION (A) 04/15/2017 0201   REPTSTATUS 04/16/2017 FINAL 04/15/2017 0201     Radiological Exams on Admission: No results found.  Chart has been reviewed    Assessment/Plan  84 y.o. male with medical history significant of a.fib on coumadin, ESRD on HD, chronic diastolic  CHF, bioprosthetic AVR, COPD, HTN, DM2, Gi bleed    Admitted for recurrent melena  Present on Admission:   . GI bleed -  - [place] score BUN >18.2 *, Hg <16W  systolic BP <109   melena     >1 Justifies admission and aggressive management      Modifying risk factors include  anticoagulation,  Prior hx of GI bleed       -    AIMS 65 = Alb <3,  INR >1.5 Mental status change, SBP <90 (0-4) TOTAL of 0               Reassuring      - I spoke to gastroenterology ([place] ) they will see patient in a.m. appreciate their consult   - serial CBC.    - Monitor for any recurrence,  evidence of hemodynamic instability or significant blood loss  - Transfuse as needed for hemoglobin below 7 or evidence of life-threatening bleeding  - Establish at least 2 PIV and fluid resuscitate   - clear liquids for tonight keep nothing by mouth post midnight,   -  administer Protonix   twice a day       . Essential hypertension -continue home medications no evidence of hypertension . Diabetes mellitus with complication (HCC) -  - Order Sensitive  SSI     -  check TSH and HgA1C  - Hold by mouth medications   . PVD, Rt SFA PTA/HSRA 06/17/11 -currently stable . Hyperlipidemia stable continue home medications  . COPD (chronic obstructive pulmonary disease) ([place]) stable continue home medications . ESRD (end stage renal disease) ([place]) -no need to dialyze in a.m. patient have not have had dialysis since Friday we will consult nephrology . Tobacco use - chronic discussed importance of smoking cessation but patient is not interested  Chronic A. Fib -          - CHA2DS2 vas score 5 : Hold anticoagulation secondary to   recurrent bleeding would recommend discontinuation discussed with cardiology Dr. Einar Gip will see in consult and discussed with family        -  Rate control:  Currently controlled with  Toprolol,    Other plan as per orders.  DVT prophylaxis:  SCD   Code Status:  FULL CODE  as per patient     Family Communication:   Family  at  Bedside  plan of care was discussed with   Son,   Disposition Plan:   Back to current facility when stable                                           Would benefit from PT/OT eval prior to DC   ordered                      Social Work                           Consults called: Cardiology spoke to Dr. Einar Gip will see in the morning, Spoke to Dr. Cristina Gong will see in consult in AM.  Spoke to Dr. Justin Mend will see in consult in AM  Admission status:   inpatient     Level of care   tele           I have spent a total of 76 min on this admission  extra time was spent to discuss case with consultants  Dorena Dorfman 05/05/2017, 8:15 PM    [facility]  Pager [phone]   after 2 AM please page floor coverage PA If 7AM-7PM, please contact the day team taking care of the patient  [email or web address]  Password [email or web address]

## 2017-05-05 NOTE — ED Triage Notes (Signed)
Pt here from home with complaint of dark stool with some bright red blood noted. Pt is MWF dialysis patient, last dialyzed on Friday. Pt also had several nose bleeds per family last week. Pt takes coumadin.

## 2017-05-05 NOTE — ED Notes (Signed)
Pt reports that he does not make urine.

## 2017-05-06 ENCOUNTER — Other Ambulatory Visit: Payer: Self-pay

## 2017-05-06 DIAGNOSIS — D699 Hemorrhagic condition, unspecified: Secondary | ICD-10-CM

## 2017-05-06 DIAGNOSIS — D649 Anemia, unspecified: Secondary | ICD-10-CM

## 2017-05-06 DIAGNOSIS — R040 Epistaxis: Secondary | ICD-10-CM

## 2017-05-06 LAB — COMPREHENSIVE METABOLIC PANEL
ALT: 11 U/L — ABNORMAL LOW (ref 17–63)
ANION GAP: 14 (ref 5–15)
AST: 18 U/L (ref 15–41)
Albumin: 2.7 g/dL — ABNORMAL LOW (ref 3.5–5.0)
Alkaline Phosphatase: 74 U/L (ref 38–126)
BUN: 43 mg/dL — ABNORMAL HIGH (ref 6–20)
CHLORIDE: 98 mmol/L — AB (ref 101–111)
CO2: 23 mmol/L (ref 22–32)
Calcium: 8.8 mg/dL — ABNORMAL LOW (ref 8.9–10.3)
Creatinine, Ser: 7.04 mg/dL — ABNORMAL HIGH (ref 0.61–1.24)
GFR, EST AFRICAN AMERICAN: 7 mL/min — AB (ref >60)
GFR, EST NON AFRICAN AMERICAN: 6 mL/min — AB (ref >60)
Glucose, Bld: 149 mg/dL — ABNORMAL HIGH (ref 65–99)
POTASSIUM: 4.3 mmol/L (ref 3.5–5.1)
SODIUM: 135 mmol/L (ref 135–145)
Total Bilirubin: 0.7 mg/dL (ref 0.3–1.2)
Total Protein: 6.4 g/dL — ABNORMAL LOW (ref 6.5–8.1)

## 2017-05-06 LAB — CBC
HCT: 28.4 % — ABNORMAL LOW (ref 39.0–52.0)
HEMOGLOBIN: 9.2 g/dL — AB (ref 13.0–17.0)
MCH: 30.8 pg (ref 26.0–34.0)
MCHC: 32.4 g/dL (ref 30.0–36.0)
MCV: 95 fL (ref 78.0–100.0)
PLATELETS: 164 10*3/uL (ref 150–400)
RBC: 2.99 MIL/uL — AB (ref 4.22–5.81)
RDW: 17.9 % — ABNORMAL HIGH (ref 11.5–15.5)
WBC: 10.5 10*3/uL (ref 4.0–10.5)

## 2017-05-06 LAB — IRON AND TIBC
IRON: 23 ug/dL — AB (ref 45–182)
SATURATION RATIOS: 18 % (ref 17.9–39.5)
TIBC: 125 ug/dL — AB (ref 250–450)
UIBC: 102 ug/dL

## 2017-05-06 LAB — VITAMIN B12: Vitamin B-12: 1071 pg/mL — ABNORMAL HIGH (ref 180–914)

## 2017-05-06 LAB — URINALYSIS, MICROSCOPIC (REFLEX)

## 2017-05-06 LAB — FERRITIN: FERRITIN: 126 ng/mL (ref 24–336)

## 2017-05-06 LAB — GLUCOSE, CAPILLARY
GLUCOSE-CAPILLARY: 95 mg/dL (ref 65–99)
GLUCOSE-CAPILLARY: 195 mg/dL — AB (ref 65–99)
Glucose-Capillary: 127 mg/dL — ABNORMAL HIGH (ref 65–99)
Glucose-Capillary: 124 mg/dL — ABNORMAL HIGH (ref 65–99)

## 2017-05-06 LAB — MAGNESIUM: MAGNESIUM: 2.3 mg/dL (ref 1.7–2.4)

## 2017-05-06 LAB — URINALYSIS, ROUTINE W REFLEX MICROSCOPIC

## 2017-05-06 LAB — PHOSPHORUS: PHOSPHORUS: 4.7 mg/dL — AB (ref 2.5–4.6)

## 2017-05-06 LAB — TSH: TSH: 1.096 u[IU]/mL (ref 0.350–4.500)

## 2017-05-06 LAB — RETICULOCYTES
RBC.: 2.99 MIL/uL — ABNORMAL LOW (ref 4.22–5.81)
Retic Count, Absolute: 86.7 10*3/uL (ref 19.0–186.0)
Retic Ct Pct: 2.9 % (ref 0.4–3.1)

## 2017-05-06 LAB — MRSA PCR SCREENING: MRSA BY PCR: NEGATIVE

## 2017-05-06 LAB — CBG MONITORING, ED: GLUCOSE-CAPILLARY: 101 mg/dL — AB (ref 65–99)

## 2017-05-06 LAB — FOLATE: Folate: 14.5 ng/mL (ref >5.9)

## 2017-05-06 MED ORDER — ENSURE ENLIVE PO LIQD
237.0000 mL | Freq: Two times a day (BID) | ORAL | Status: DC
  Administered 2017-05-06: 237 mL via ORAL

## 2017-05-06 MED ORDER — SODIUM CHLORIDE 0.9 % IV SOLN
125.0000 mg | Freq: Every day | INTRAVENOUS | Status: DC
  Administered 2017-05-06 – 2017-05-09 (×4): 125 mg via INTRAVENOUS
  Filled 2017-05-06 (×11): qty 10

## 2017-05-06 MED ORDER — NA FERRIC GLUC CPLX IN SUCROSE 12.5 MG/ML IV SOLN
125.0000 mg | Freq: Every day | INTRAVENOUS | Status: DC
  Filled 2017-05-06: qty 10

## 2017-05-06 MED ORDER — NEPRO/CARBSTEADY PO LIQD
237.0000 mL | Freq: Two times a day (BID) | ORAL | Status: DC
  Administered 2017-05-06 – 2017-05-09 (×4): 237 mL via ORAL
  Filled 2017-05-06 (×11): qty 237

## 2017-05-06 MED ORDER — PANTOPRAZOLE SODIUM 40 MG PO TBEC: 40.0000 mg | DELAYED_RELEASE_TABLET | Freq: Every day | ORAL | Status: DC

## 2017-05-06 MED ORDER — RENA-VITE PO TABS
1.0000 | ORAL_TABLET | Freq: Every day | ORAL | Status: DC
  Administered 2017-05-06 – 2017-05-09 (×4): 1 via ORAL
  Filled 2017-05-06 (×4): qty 1

## 2017-05-06 MED ORDER — PANTOPRAZOLE SODIUM 40 MG PO TBEC
40.0000 mg | DELAYED_RELEASE_TABLET | Freq: Every day | ORAL | Status: DC
  Administered 2017-05-07 – 2017-05-10 (×4): 40 mg via ORAL
  Filled 2017-05-06 (×4): qty 1

## 2017-05-06 NOTE — Progress Notes (Signed)
0600: Handoff report received from ED RN. Pt transferred to 4N04. Skin assessment completed; see charting. Pt very pleasant and very hard of hearing.   0700: Handoff report given to RN. No acute events during my time caring for this pt.

## 2017-05-06 NOTE — Care Management Note (Signed)
Case Management Note  Patient Details  Name: Ronald [name] MRN: [id number] Date of Birth: Apr 07, 1922  Subjective/Objective:  Pt admitted on 05/05/17 with melena.  PTA, pt resided at [facility].  [facility] following at [place] for RN, [place], and aide.                  Action/Plan: CSW consulted to facilitate return to ALF, if able, upon medical stability.  Will follow progress.   Expected Discharge Date:                  Expected Discharge Plan:  Assisted Living / Rest Home  In-House Referral:  Clinical Social Work  Discharge planning Services  CM Consult  Post Acute Care Choice:    Choice offered to:     DME Arranged:    DME Agency:     HH Arranged:    HH Agency:     Status of Service:  In process, will continue to follow  If discussed at Long Length of Stay Meetings, dates discussed:    Additional Comments:  Reinaldo Raddle, RN, BSN  Trauma/Neuro ICU Case Manager [phone]

## 2017-05-06 NOTE — Consult Note (Addendum)
[facility] Consult: 8:21 AM 05/06/2017  LOS: 1 day    Referring Provider: Dr Jonnie Finner  Primary Care Physician:  [name], MD Primary Gastroenterologist:  Dr. Sharlett Iles >> Henrene Pastor as inpt >> Carlean Purl as inpt.      Reason for Consultation:  Anemia, FOBT + stool   HPI: Ronald [name] is a 84 y.o. male.   PMH type 2 DM, insulin requiring.  PVD.  s/p renal artery stent.  s/p bil iliac stents.  Chronic Coumadin for A fib.   Diastolic CHF.  Bladder cancer.  Aortic stenosis, s/p bovine AVR 05-13-2010.  Gastric ulcer in 1950s.  Chronic anemia, on po Iron.  PRBC  Transfusions: 2 U 06/2017, 1 U 12/2016, 3 units 13-May-2017.  COPD.  Moderate pulmonary htn.  Smoker. Chronic thrombocytopenia dating back to May 13, 2012 with counts in the 60s to 80s.  Osteoporosis on Fosamax.  left hip ORIF 2013/05/13.  Left shoulder arthroplasty post fall/fracture 12/2016.  Borderline hepatomegaly, no splenomegaly, GB polyps per ultrasound 2006-05-13.  Liver normal on non-contrast CT in 14-May-2011.    Hx colon polyps.  Hyperplastic polyp 1991.  Tubular adenoma 14-May-2003.  His brother died at age 23 from colon cancer.  07/2006 Colonoscopy, for constipation and polyp surveillance:  Lipomatous IC valve and diverticulosis, no new polyps.  No previous upper endoscopies in the Epic record. 02/2017 MBSS:  Moderate oropharyngeal dysphagia.  Late laryngeal closure leading to sensed penetration to the cords during the swallow. Pt clears throat or coughs softly, but not forcefully enough to fully eject penetrate.  Moderate aspiration risk.  OK for solids, thin liquids.        This is admit is # 7 since 06/2016.  Latest was 2/14 - 03/31/38 for diastolic CHF flare when he initiated on HD for ESRD.  During that admission anemia worse than baseline, Hgb nadir 7.3 was up to 8.6 on 3/1. Coags up to 4.9.  MCV ~  91.   Also thrombocytopenia, platelet nadir 88.  FOBT +.   GI eval of 2/14 for 6 months (intermittent?) melena, anemia. Pt had been po iron BID.   Dr Carlean Purl advised holding Coumadin, letting INR drift and elected not to scope.  Restarted Coumadin at discharge to [facility] assisted living.      Black stools noted last week.   Pt has irregular stools, sometimes daily, sometimes every few days.  Has seen tarry stools for "five weeks".  No abdominal pain, nausea, dysphagia, pyrosis.  + anorexia.  Weight 178 1978-05-13 kg 06/2016, /68kg now.   Intermittent epistaxis, a few times a month.  Last episode ~ 1 week ago when he say ~ 8 circumference blood stain on bed sheets in AM.  However family reported he'd had "several " nose bleeds last week.  They also report red blood PR in contrast to pt's report of tarry stool last week.   Pt self treats by plugging nose with kleenex, has never seen ENT.  No ASA, NSAIDs.  Rare beer in the summer, never a heavy drinker.   Pt's PCP advised  visit to ED for eval of the GI bleeding.     Hgb 10 >>  9.2.  MCV 96.  Platelets 164.  INR 3.2.  FOBT + black stool in ED.      Past Medical History:  Diagnosis Date  . AS (aortic stenosis)    bovine aortic valve replacement 01/07/11  . Bladder cancer [facility])    Bladder Cancer local  . Blood transfusion    w/hip operation  . Cellulitis of left lower extremity   . Chronic atrial fibrillation ([place])   . Chronic diastolic congestive heart failure ([place])   . Claudication in peripheral vascular disease ([place]) 06/17/2011  . COPD (chronic obstructive pulmonary disease) ([place])   . Depression wife died 4 years ago.    Marland Kitchen History of stomach ulcers ~ 1951  . HTN (hypertension)   . Neuropathy due to secondary diabetes ([place])   . Peripheral vascular disease ([place]) very poor circulation legs and feet ... stents right and left legs... done in dr j. Gwenlyn Found 's office.   . Pneumonia 07/25/11   left  . Recurrent upper respiratory infection (URI)     sinusitis  . Renal artery stenosis ([place]) 2006   renal artery stent  . S/P angioplasty with stent, diamond back rotational athrectomy Prox. Rt. SFA 06/17/2011 06/17/2011  . Shortness of breath   . Thrombocytopenia due to drugs    seen by Dr Inda Merlin plts [id number] no rx  . Type II diabetes mellitus ([place])     Past Surgical History:  Procedure Laterality Date  . ABDOMINAL ANGIOGRAM  06/17/2011   Procedure: ABDOMINAL ANGIOGRAM;  Surgeon: Lorretta Harp, MD;  Location: [facility] CATH LAB;  Service: Cardiovascular;;  . AORTIC VALVE REPLACEMENT  01/07/2011   Procedure: AORTIC VALVE REPLACEMENT (AVR);  Surgeon: Grace Isaac, MD;  Location: [place];  Service: Open Heart Surgery;  Laterality: N/A;; magna-ease bovine 69m bioprosthesis  . ATHERECTOMY N/A 06/17/2011   Procedure: ATHERECTOMY;  Surgeon: JLorretta Harp MD;  [facility];  Service: Cardiovascular;  Laterality: N/A;  . AV FISTULA PLACEMENT Left 04/24/2017   Procedure: INSERTION OF ARTERIOVENOUS (AV) GORE-TEX GRAFT ARM;  Surgeon: CConrad Surprise MD;  [place]  Service: Vascular;  Laterality: Left;  . CARDIAC CATHETERIZATION  11/19/10   normal coronaries, mod AS, 75% l RAS  . CARDIOVERSION  04/03/2011   Procedure: CARDIOVERSION;  Surgeon: DLeonie Man MD;  [place]  Service: Cardiovascular;  Laterality: N/A;  . CATARACT EXTRACTION W/ INTRAOCULAR LENS  IMPLANT, BILATERAL  ~ 2007  . CHOLECYSTECTOMY    . FEMUR IM NAIL  06/27/2011   Procedure: INTRAMEDULLARY (IM) NAIL FEMORAL;  Surgeon: KMarin Shutter MD;  Location: [facility];  Service: Orthopedics;  Laterality: Right;  . FEMUR IM NAIL Left 12/14/2013   Procedure: INTRAMEDULLARY (IM) NAIL FEMORAL;  Surgeon: MMauri Pole MD;  Location: [facility];  Service: Orthopedics;  Laterality: Left;  . FRACTURE SURGERY    . IR FLUORO GUIDE CV LINE LEFT  04/17/2017  . IR [place] VASC ACCESS LEFT  04/17/2017  . LOWER EXTREMITY ANGIOGRAM  06/17/2011   diamondback orbital rotational and  cutting balloon atherectomy of the prox R SFA  . LOWER EXTREMITY ANGIOGRAM Bilateral 06/17/2011   Procedure: LOWER EXTREMITY ANGIOGRAM;  Surgeon: JLorretta Harp MD;  [facility];  Service: Cardiovascular;  Laterality: Bilateral;  . PERIPHERAL ARTERIAL STENT GRAFT     2006 left anf right illiac stents Dr BDeon Pilling .  RENAL ARTERY STENT  2006   "I believe"  . REVERSE SHOULDER ARTHROPLASTY Left 12/31/2016   Procedure: LEFT REVERSE SHOULDER ARTHROPLASTY;  Surgeon: Nicholes Stairs, MD;  Location: [place];  Service: Orthopedics;  Laterality: Left;  . TONSILLECTOMY AND ADENOIDECTOMY     "when I was a kid"    Prior to Admission medications   Medication Sig Start Date End Date Taking? Authorizing Provider  acetaminophen (TYLENOL) 325 MG tablet Take 650 mg by mouth every 6 (six) hours as needed ("for pain score 1-4; not to exceed #8 tablets in 24 hours").    Yes [provider]  albuterol (ACCUNEB) 0.63 MG/3ML nebulizer solution Take 3 mLs by nebulization every 6 (six) hours as needed for wheezing or shortness of breath.   Yes [provider]  buPROPion (WELLBUTRIN XL) 150 MG 24 hr tablet Take 150 mg by mouth daily.    Yes [provider]  diphenhydrAMINE (BENADRYL) 12.5 MG/5ML elixir Take 5 mLs (12.5 mg total) by mouth every 6 (six) hours as needed for itching. Over the counter Patient taking differently: Take 12.5 mg by mouth every 6 (six) hours as needed for itching.  04/25/17  Yes Rai, Ripudeep K, MD  doxazosin (CARDURA) 2 MG tablet Take 1 tablet (2 mg total) by mouth at bedtime. 04/25/17  Yes Rai, Ripudeep K, MD  ferrous sulfate 325 (65 FE) MG EC tablet Take 1 tablet (325 mg total) by mouth 2 (two) times daily. 07/04/16 07/04/17 Yes Lavina Hamman, MD  finasteride (PROSCAR) 5 MG tablet Take 5 mg by mouth at bedtime.    Yes [provider]  fluticasone (FLONASE) 50 MCG/ACT nasal spray Place 1 spray into both nostrils 2 (two) times daily.    Yes [provider]  glipiZIDE (GLUCOTROL) 5 MG tablet Take 5 mg by mouth daily before breakfast.   Yes [provider]  loratadine (CLARITIN) 10 MG tablet Take 10 mg by mouth daily.   Yes [provider]  Melatonin 5 MG TABS Take 5 mg by mouth at bedtime.   Yes [provider]  metoprolol succinate (TOPROL-XL) 25 MG 24 hr tablet Take 0.5 tablets (12.5 mg total) by mouth 2 (two) times daily with a meal. 04/26/17  Yes Rai, Ripudeep K, MD  Multiple Vitamins-Minerals (MULTIVITAMIN WITH MINERALS) tablet Take 1 tablet by mouth at bedtime.   Yes [provider]  OXYGEN Inhale 2 L into the lungs continuous.   Yes [provider]  simvastatin (ZOCOR) 20 MG tablet Take 20 mg by mouth every evening.   Yes [provider]  traMADol (ULTRAM) 50 MG tablet Take 1 tablet (50 mg total) by mouth every 12 (twelve) hours as needed for moderate pain. Patient taking differently: Take 50 mg by mouth 2 (two) times daily.  04/25/17  Yes Rai, Ripudeep K, MD  Umeclidinium-Vilanterol (ANORO ELLIPTA) 62.5-25 MCG/INH AEPB Inhale 1 puff into the lungs daily.   Yes [provider]  warfarin (COUMADIN) 2 MG tablet Take 1-2 mg by mouth See admin instructions. Take 2 mg by mouth in the evening on Sun/Mon/Wed/Fri/Sat and 1 mg on Tues/Thurs 12/15/16  Yes [provider]  glyBURIDE (DIABETA) 5 MG tablet Take 1 tablet (5 mg total) by mouth daily with breakfast. Patient not taking: Reported on 05/05/2017 04/25/17   Rai, Vernelle Emerald, MD  multivitamin (RENA-VIT) TABS tablet Take 1 tablet by mouth at bedtime. Patient not taking: Reported on 05/05/2017 04/25/17   Mendel Corning, MD  Scheduled Meds: . buPROPion  150 mg Oral Daily  . doxazosin  2 mg Oral QHS  . feeding supplement (ENSURE ENLIVE)  237 mL Oral BID BM  . finasteride  5 mg Oral QHS  . insulin aspart  0-9 Units Subcutaneous Q4H  . metoprolol succinate  12.5 mg Oral BID WC  . pantoprazole (PROTONIX) IV  40 mg  Intravenous Q12H  . simvastatin  20 mg Oral QPM  . traMADol  50 mg Oral BID   Infusions:  PRN Meds: acetaminophen **OR** acetaminophen, albuterol, HYDROcodone-acetaminophen, ondansetron **OR** ondansetron (ZOFRAN) IV   Allergies as of 05/05/2017 - Review Complete 05/05/2017  Allergen Reaction Noted  . Aspirin Anaphylaxis, Shortness Of Breath, Rash, and Other (See Comments) 05/30/2008  . Tape Other (See Comments) 05/05/2017    Family History  Problem Relation Age of Onset  . Heart disease Mother   . Cancer Brother        colon  . Stroke Father     Social History   Socioeconomic History  . Marital status: Widowed    Spouse name: Not on file  . Number of children: 3  . Years of education: Not on file  . Highest education level: Not on file  Social Needs  . Financial resource strain: Not on file  . Food insecurity - worry: Not on file  . Food insecurity - inability: Not on file  . Transportation needs - medical: Not on file  . Transportation needs - non-medical: Not on file  Occupational History  . Occupation: Press photographer  Tobacco Use  . Smoking status: Current Every Day Smoker    Packs/day: 1.50    Years: 75.00    Pack years: 112.50    Types: Cigarettes  . Smokeless tobacco: Never Used  Substance and Sexual Activity  . Alcohol use: No    Alcohol/week: 1.2 oz    Types: 2 Cans of beer per week  . Drug use: No  . Sexual activity: No  Other Topics Concern  . Not on file  Social History Narrative  . Not on file    REVIEW OF SYSTEMS: Constitutional:  Able to walk short distances if leaning on walker, human, furniture etc ENT:  Per HPI Pulm:  No new SOB, this is occasional problem.  Last cigarette 3 days ago.   CV:  No palpitations, no LE edema. No chest pain GU:  No hematuria, no frequency GI:  See HPI Heme:  Bruises easily.   Transfusions:  Per HPI Neuro:  No headaches, no peripheral tingling or numbness Derm:  No itching, no rash or sores.  Endocrine:  No  sweats or chills.  No polyuria or dysuria Immunization:  Not queried.   Travel:  None beyond local counties in last few months.    PHYSICAL EXAM: Vital signs in last 24 hours: Vitals:   05/06/17 0640 05/06/17 0736  BP:    Pulse:    Resp:    Temp: 98.4 F (36.9 C) 98.2 F (36.8 C)  SpO2:     Wt Readings from Last 3 Encounters:  05/06/17 151 lb 3.8 oz (68.6 kg)  05/05/17 158 lb (71.7 kg)  04/25/17 153 lb 3.5 oz (69.5 kg)    General: frail, aged WM.  Looks better in person than expected.   Head:  No asymmetry or swelling.  No signs head trauma  Eyes:  No icterus or conj pallor.  EOMI Ears:  HOH  Nose:  No discharge or dried blood at distal nares Mouth:  Moist, pink, clear oral MM.  Dentition in good repair.   Neck:  No JVD, no TMG, no bruits Lungs:  Clear bil.  Some moderatly loose cough.  No dyspnea Heart: RRR with soft 1/6 murmer Abdomen: Soft, thin, active bowel sounds.  No hernias, bruits, splenomegaly.  Liver edge smooth, 2 -3 mm below costal margin.   Rectal: black formed stool is FOBT +.  No masses.  Large pressure dressing covering buttocks/sacrum.     Musc/Skeltl: no joint redness or swelling Extremities:  No pedal edema.  Slight edema in left arm where stapled AV graft positioned in upper arm.   Neurologic:  Oriented to place, self, time.  HOH.  Answers appropriately but poor recall of details.   Skin:  No rash, no telangectasia.   Tattoos:  none Nodes:  No cervical or inguinal adenopathy.     Psych:  Pleasant, cooperative, calm.    Intake/Output from previous day: 03/11 0701 - 03/12 0700 In: 356.7 [I.V.:356.7] Out: -  Intake/Output this shift: No intake/output data recorded.  LAB RESULTS: Recent Labs    05/05/17 1400 05/06/17 0224  WBC 9.4 10.5  HGB 10.0* 9.2*  HCT 31.5* 28.4*  PLT 195 164   BMET Lab Results  Component Value Date   NA 135 05/06/2017   NA 133 (L) 05/05/2017   NA 136 04/25/2017   K 4.3 05/06/2017   K 4.5 05/05/2017   K 4.7  04/25/2017   CL 98 (L) 05/06/2017   CL 95 (L) 05/05/2017   CL 101 04/25/2017   CO2 23 05/06/2017   CO2 25 05/05/2017   CO2 25 04/25/2017   GLUCOSE 149 (H) 05/06/2017   GLUCOSE 237 (H) 05/05/2017   GLUCOSE 128 (H) 04/25/2017   BUN 43 (H) 05/06/2017   BUN 36 (H) 05/05/2017   BUN 22 (H) 04/25/2017   CREATININE 7.04 (H) 05/06/2017   CREATININE 6.47 (H) 05/05/2017   CREATININE 4.33 (H) 04/25/2017   CALCIUM 8.8 (L) 05/06/2017   CALCIUM 8.9 05/05/2017   CALCIUM 8.7 (L) 04/25/2017   LFT Recent Labs    05/05/17 1400 05/06/17 0224  PROT 7.0 6.4*  ALBUMIN 2.9* 2.7*  AST 23 18  ALT 13* 11*  ALKPHOS 86 74  BILITOT 0.5 0.7   PT/INR Lab Results  Component Value Date   INR 3.22 05/05/2017   INR 1.46 04/16/2017   INR 2.12 04/15/2017   Hepatitis Panel No results for input(s): HEPBSAG, HCVAB, HEPAIGM, HEPBIGM in the last 72 hours. C-Diff No components found for: CDIFF Lipase     Component Value Date/Time   LIPASE 17 07/02/2016 1255    Drugs of Abuse  No results found for: LABOPIA, COCAINSCRNUR, LABBENZ, AMPHETMU, THCU, LABBARB   RADIOLOGY STUDIES: No results found.    IMPRESSION:   *  FOBT +melenic stools in pt on iron, coumadin with supratherapeutic INR.  Remote (1950s) hx of ulcers.  Not on PPI, H2B.  Has been having epistaxis, multiple episodes if family's reports accurate vs fewer episodes per pt report.    *  Chronic anemia.  Normocytic.  Chronic BID oral iron. Not clear if receiving erythropoetin at dialysis.   Hgb yest and today better than last month and near discharge 10 days ago.  Last PRBC transfusion 3 U in 03/2017.   Iron and TIBC low.  Iron sats ok. Ferritin 126.  Folate and B12 ok.   Looks like anemia of chronic disease and some blood loss.    *  Chronic thrombocytopenia,  non-critical at present.    *  Chronic Coumadin for a fib, not for s/p bioprosthetic AVR 2012.  INR 3.2 yesterday.  *  Adenomatous colon polyp 2005, none in 2008.  Brother with hx colon  cancer age 69.   *   ESRD, began MWF HD in late 03/2017.  Missed HD yesterday and scheduled for HD today.      PLAN:     *  Cardiologist, Dr Adrian Prows has ok'd holding Coumadin and deems pt low risk for undergoing GI workup.    *  Switched to PO Protonix.  Allow renal/carb mod diet.  Coags, CBC in AM.    *  EGD once INR </=to 1.8.  Earliest likely date is 3/14.    [name]  05/06/2017, 8:21 AM Pager: [phone]  GI ATTENDING  History, laboratories, x-rays reviewed. Patient seen and examined. Agree with comprehensive consultation note as outlined above. Extremely complicated 84 year old who was admitted to the hospital with dark stools concerning for GI bleeding on chronic anticoagulation. Seen recently for similar problems and treated conservatively. Despite history of peptic ulcer disease has not been on PPI. Reports of epistaxis. No acute bleeding. Hemoglobin actually favorable compared to discharge. Possible causes for his dark Hemoccult-positive stool include recurrent ulcer disease, erosions, AVMs, nonspecific mucosal oozing in the face of chronic anticoagulation, and neoplasia. Also, very likely could be related to epistaxis. Recommend PPI. Recommend transfuse as clinically indicated. Recommend upper endoscopy as this is a recurrent admission for the same. The patient is HIGH RISK given his age and comorbidities. Plan on the procedure with MAC and when his INR has drifted less than 2.  [name], M.D. [facility] Division of Gastroenterology

## 2017-05-06 NOTE — Progress Notes (Signed)
Chaplain met with the PT and talked about his current condition, we also discussed his needing clarity of what he was actually here to do.  Chaplain called nurse and asked for PT education.

## 2017-05-06 NOTE — Evaluation (Signed)
Physical Therapy Evaluation Patient Details Name: Ronald [name] MRN: [id number] DOB: January 21, 1923 Today's Date: 05/06/2017   History of Present Illness  84 y.o. male admitted for melena. PMhx: Afib on coumadin, ESRD, chronic diastolic CHF, AVR, COPD, HTN, DM2, Gi bleed, PVD  Clinical Impression  Pt pleasant but very HOH and reports very limited mobility at baseline. Pt with decreased balance, strength, activity tolerance and function who is a high fall risk with report of at least 6 falls in the last year. Pt will benefit from acute therapy to maximize mobility, safety, function and strength to decrease burden of care.      Follow Up Recommendations SNF;Supervision/Assistance - 24 hour    Equipment Recommendations  None recommended by PT    Recommendations for Other Services       Precautions / Restrictions Precautions Precautions: Fall Restrictions Weight Bearing Restrictions: No      Mobility  Bed Mobility Overal bed mobility: Needs Assistance Bed Mobility: Supine to Sit     Supine to sit: Min assist     General bed mobility comments: min assist to elevate trunk from surface with increased time and cues, HOB 30 degrees  Transfers Overall transfer level: Needs assistance   Transfers: Sit to/from Stand Sit to Stand: Min assist         General transfer comment: assist to rise and to control descent to surface with cues for hand placement  Ambulation/Gait Ambulation/Gait assistance: Min assist Ambulation Distance (Feet): 35 Feet Assistive device: Rolling walker (2 wheeled) Gait Pattern/deviations: Step-through pattern;Decreased stride length;Trunk flexed   Gait velocity interpretation: Below normal speed for age/gender General Gait Details: cues for position in RW and safety, pt with posterior LOB with turning requiring assist to correct. slow short steps limited by fatigue  Stairs            Wheelchair Mobility    Modified Rankin (Stroke Patients  Only)       Balance Overall balance assessment: Needs assistance   Sitting balance-Leahy Scale: Good       Standing balance-Leahy Scale: Poor                               Pertinent Vitals/Pain Pain Assessment: No/denies pain    Home Living Family/patient expects to be discharged to:: Skilled nursing facility Living Arrangements: Alone             Home Equipment: Walker - 2 wheels;Walker - 4 wheels;Cane - single point;Electric scooter;Grab bars - tub/shower;Wheelchair - Sport and exercise psychologist Comments: pt from [facility].    Prior Function Level of Independence: Needs assistance   Gait / Transfers Assistance Needed: pt reports he transfers to scooter for moving room to room and for majority of mobility. Walks very short distances <40' with AD  ADL's / Homemaking Assistance Needed: pt reports he bathes and dresses himself because staff take too long, staff do the cooking and cleaning  Comments: no family present     Hand Dominance        Extremity/Trunk Assessment   Upper Extremity Assessment Upper Extremity Assessment: Generalized weakness    Lower Extremity Assessment Lower Extremity Assessment: Generalized weakness    Cervical / Trunk Assessment Cervical / Trunk Assessment: Kyphotic  Communication   Communication: HOH(speak into Right ear)  Cognition Arousal/Alertness: Awake/alert Behavior During Therapy: WFL for tasks assessed/performed Overall Cognitive Status: Within Functional Limits for tasks assessed  General Comments      Exercises     Assessment/Plan    PT Assessment Patient needs continued PT services  PT Problem List Decreased strength;Decreased mobility;Decreased range of motion;Decreased activity tolerance;Decreased balance;Decreased knowledge of use of DME       PT Treatment Interventions DME instruction;Therapeutic activities;Gait  training;Therapeutic exercise;Functional mobility training;Patient/family education    PT Goals (Current goals can be found in the Care Plan section)  Acute Rehab PT Goals Patient Stated Goal: return home and to work PT Goal Formulation: With patient Time For Goal Achievement: 05/20/17 Potential to Achieve Goals: Fair    Frequency Min 3X/week   Barriers to discharge Decreased caregiver support      Co-evaluation               AM-PAC PT "6 Clicks" Daily Activity  Outcome Measure Difficulty turning over in bed (including adjusting bedclothes, sheets and blankets)?: Unable Difficulty moving from lying on back to sitting on the side of the bed? : Unable Difficulty sitting down on and standing up from a chair with arms (e.g., wheelchair, bedside commode, etc,.)?: Unable Help needed moving to and from a bed to chair (including a wheelchair)?: A Little Help needed walking in hospital room?: A Little Help needed climbing 3-5 steps with a railing? : A Lot 6 Click Score: 11    End of Session Equipment Utilized During Treatment: Gait belt Activity Tolerance: Patient tolerated treatment well Patient left: in chair;with chair alarm set;with call bell/phone within reach Nurse Communication: Mobility status PT Visit Diagnosis: Muscle weakness (generalized) (M62.81);Difficulty in walking, not elsewhere classified (R26.2)    Time: 1005-1026 PT Time Calculation (min) (ACUTE ONLY): 21 min   Charges:   PT Evaluation $PT Eval Moderate Complexity: 1 Mod     PT G Codes:        Elwyn Reach, PT [phone]   [place] 05/06/2017, 10:41 AM

## 2017-05-06 NOTE — Consult Note (Signed)
Reason for Consult: A. Fib Referring Physician: [name], MD  Ronald [name] is an 84 y.o. male.  HPI: [name] is a fairly active man origin area with history of aortic 12 replacement with a bioprosthetic valve, chronic atrial fibrillation, peripheral arterial disease with history of revascularization to the lower extremities in the past, hypertension, diabetes mellitus, hyperlipidemia, continued tobacco use disorder admitted with melanotic stools with drop in hemoglobin. He is presently on Coumadin for anticoagulation, I was requested to see the patient regarding anticoagulation.  Patient does remain stable from cardiac standpoint and denies any chest pain, dyspnea, dizziness or syncope except for occasional dizziness if he suddenly gets up quickly. He had noticed dark stools for the past 3 weeks but felt that this is related to patient being on iron supplements. He is now on hemodialysis 3 times a week.  Past Medical History:  Diagnosis Date  . AS (aortic stenosis)    bovine aortic valve replacement 01/07/11  . Bladder cancer [facility])    Bladder Cancer local  . Blood transfusion    w/hip operation  . Cellulitis of left lower extremity   . Chronic atrial fibrillation ([place])   . Chronic diastolic congestive heart failure ([place])   . Claudication in peripheral vascular disease ([place]) 06/17/2011  . COPD (chronic obstructive pulmonary disease) ([place])   . Depression wife died 4 years ago.    Marland Kitchen History of stomach ulcers ~ 1951  . HTN (hypertension)   . Neuropathy due to secondary diabetes ([place])   . Peripheral vascular disease ([place]) very poor circulation legs and feet ... stents right and left legs... done in dr j. Gwenlyn Found 's office.   . Pneumonia 07/25/11   left  . Recurrent upper respiratory infection (URI)    sinusitis  . Renal artery stenosis ([place]) 2006   renal artery stent  . S/P angioplasty with stent, diamond back rotational athrectomy Prox. Rt. SFA 06/17/2011 06/17/2011  .  Shortness of breath   . Thrombocytopenia due to drugs    seen by Dr Inda Merlin plts [id number] no rx  . Type II diabetes mellitus ([place])     Past Surgical History:  Procedure Laterality Date  . ABDOMINAL ANGIOGRAM  06/17/2011   Procedure: ABDOMINAL ANGIOGRAM;  Surgeon: Lorretta Harp, MD;  Location: [facility] CATH LAB;  Service: Cardiovascular;;  . AORTIC VALVE REPLACEMENT  01/07/2011   Procedure: AORTIC VALVE REPLACEMENT (AVR);  Surgeon: Grace Isaac, MD;  Location: [place];  Service: Open Heart Surgery;  Laterality: N/A;; magna-ease bovine 38mm bioprosthesis  . ATHERECTOMY N/A 06/17/2011   Procedure: ATHERECTOMY;  Surgeon: Lorretta Harp, MD;  Location: [facility] CATH LAB;  Service: Cardiovascular;  Laterality: N/A;  . AV FISTULA PLACEMENT Left 04/24/2017   Procedure: INSERTION OF ARTERIOVENOUS (AV) GORE-TEX GRAFT ARM;  Surgeon: Conrad Waikapu, MD;  Location: [place];  Service: Vascular;  Laterality: Left;  . CARDIAC CATHETERIZATION  11/19/10   normal coronaries, mod AS, 75% l RAS  . CARDIOVERSION  04/03/2011   Procedure: CARDIOVERSION;  Surgeon: Leonie Man, MD;  Location: [place];  Service: Cardiovascular;  Laterality: N/A;  . CATARACT EXTRACTION W/ INTRAOCULAR LENS  IMPLANT, BILATERAL  ~ 2007  . CHOLECYSTECTOMY    . FEMUR IM NAIL  06/27/2011   Procedure: INTRAMEDULLARY (IM) NAIL FEMORAL;  Surgeon: Marin Shutter, MD;  [facility];  Service: Orthopedics;  Laterality: Right;  . FEMUR IM NAIL Left 12/14/2013   Procedure: INTRAMEDULLARY (IM) NAIL FEMORAL;  Surgeon: Pietro Cassis  Alvan Dame, MD;  [facility];  Service: Orthopedics;  Laterality: Left;  . FRACTURE SURGERY    . IR FLUORO GUIDE CV LINE LEFT  04/17/2017  . IR US GUIDE VASC ACCESS LEFT  04/17/2017  . LOWER EXTREMITY ANGIOGRAM  06/17/2011   diamondback orbital rotational and cutting balloon atherectomy of the prox R SFA  . LOWER EXTREMITY ANGIOGRAM Bilateral 06/17/2011   Procedure: LOWER EXTREMITY ANGIOGRAM;  Surgeon: Lorretta Harp, MD;  Location:  [facility] CATH LAB;  Service: Cardiovascular;  Laterality: Bilateral;  . PERIPHERAL ARTERIAL STENT GRAFT     2006 left anf right illiac stents Dr Deon Pilling  . RENAL ARTERY STENT  2006   "I believe"  . REVERSE SHOULDER ARTHROPLASTY Left 12/31/2016   Procedure: LEFT REVERSE SHOULDER ARTHROPLASTY;  Surgeon: Nicholes Stairs, MD;  Location: [place];  Service: Orthopedics;  Laterality: Left;  . TONSILLECTOMY AND ADENOIDECTOMY     "when I was a kid"    Family History  Problem Relation Age of Onset  . Heart disease Mother   . Cancer Brother        colon  . Stroke Father     Social History:  reports that he has been smoking cigarettes.  He has a 112.50 pack-year smoking history. he has never used smokeless tobacco. He reports that he does not drink alcohol or use drugs.  Allergies:  Allergies  Allergen Reactions  . Aspirin Anaphylaxis, Shortness Of Breath, Rash and Other (See Comments)    "broke out in white welts; red blotches neck and face; windpipe closed; ~ 1962"  . Tape Other (See Comments)    Patient's skin is VERY THIN and tears and bruises easily!!   CBC Latest Ref Rng & Units 05/06/2017 05/05/2017 04/25/2017  WBC 4.0 - 10.5 K/uL 10.5 9.4 7.4  Hemoglobin 13.0 - 17.0 g/dL 9.2(L) 10.0(L) 8.6(L)  Hematocrit 39.0 - 52.0 % 28.4(L) 31.5(L) 27.1(L)  Platelets 150 - 400 K/uL 164 195 131(L)    CMP Latest Ref Rng & Units 05/06/2017 05/05/2017 04/25/2017  Glucose 65 - 99 mg/dL 149(H) 237(H) 128(H)  BUN 6 - 20 mg/dL 43(H) 36(H) 22(H)  Creatinine 0.61 - 1.24 mg/dL 7.04(H) 6.47(H) 4.33(H)  Sodium 135 - 145 mmol/L 135 133(L) 136  Potassium 3.5 - 5.1 mmol/L 4.3 4.5 4.7  Chloride 101 - 111 mmol/L 98(L) 95(L) 101  CO2 22 - 32 mmol/L 23 25 25   Calcium 8.9 - 10.3 mg/dL 8.8(L) 8.9 8.7(L)  Total Protein 6.5 - 8.1 g/dL 6.4(L) 7.0 -  Total Bilirubin 0.3 - 1.2 mg/dL 0.7 0.5 -  Alkaline Phos 38 - 126 U/L 74 86 -  AST 15 - 41 U/L 18 23 -  ALT 17 - 63 U/L 11(L) 13(L) -    Review of Systems  Constitutional:  Negative.   HENT: Positive for hearing loss (wears aids).   Eyes: Negative.   Respiratory: Positive for shortness of breath. Negative for cough, hemoptysis, sputum production and wheezing.   Cardiovascular: Positive for claudication (stable and mild weakness in legs with walking) and leg swelling. Negative for chest pain, palpitations and PND.  Gastrointestinal: Positive for melena. Negative for abdominal pain, heartburn, nausea and vomiting.  Genitourinary: Negative.   Musculoskeletal: Negative.   Skin: Positive for rash (frail skin).  Neurological: Negative.   Endo/Heme/Allergies: Bruises/bleeds easily.  Psychiatric/Behavioral: Negative.   All other systems reviewed and are negative.  Blood pressure (!) 145/82, pulse 87, temperature 98.2 F (36.8 C), temperature source Oral, resp. rate 16, height 5'  11" (1.803 m), weight 68.6 kg (151 lb 3.8 oz), SpO2 100 %. Physical Exam  Constitutional: He is oriented to person, place, and time. He appears well-developed and well-nourished.  HENT:  Head: Atraumatic.  Eyes: Conjunctivae are normal.  Neck: Normal range of motion. Neck supple.  Cardiovascular:  S1 is variable, S2 is normal, 2/6 systolic ejection murmur in the aortic area, no gallop. Distant heart sounds.. Faint bilateral carotid bruit present. Bilateral femorals normal, popliteals and pedal pulses are absent. Capillary refill normal.  Bilateral lower extremity reveal faint edema right worse in the left, chronic venous stasis dermatitis present, chronic ischemic changes present, no ulcerations.  Respiratory: Effort normal and breath sounds normal.  Barrel-shaped chest.  GI: Soft. Bowel sounds are normal.  Musculoskeletal: Normal range of motion.  Neurological: He is alert and oriented to person, place, and time.  Skin: Skin is warm and dry.  Psychiatric: He has a normal mood and affect.   EKG 05/06/2017: Atrial fibrillation with controlled ventricular response at the rate of 87 bpm,  IVCD, LVH. Normal QT interval. No significant change from prior EKG 03/10/2017.  Echocardiogram 03/12/2017: Normal LVEF, normal gradients across the bioprosthetic aortic valve. Moderate pulmonary hypertension, here pressure 49 mmHg.  Scheduled Meds: . buPROPion  150 mg Oral Daily  . doxazosin  2 mg Oral QHS  . feeding supplement (ENSURE ENLIVE)  237 mL Oral BID BM  . finasteride  5 mg Oral QHS  . insulin aspart  0-9 Units Subcutaneous Q4H  . metoprolol succinate  12.5 mg Oral BID WC  . pantoprazole  40 mg Oral Q0600  . simvastatin  20 mg Oral QPM  . traMADol  50 mg Oral BID   Continuous Infusions: . ferric gluconate (FERRLECIT/NULECIT) IV     PRN Meds:.acetaminophen **OR** acetaminophen, albuterol, HYDROcodone-acetaminophen, ondansetron **OR** ondansetron (ZOFRAN) IV  Assessment/Plan: 1. Chronic atrial fibrillation CHA2DS2-VASc Score is 4 with yearly risk of stroke of 4 %.  2. GI bleed 3. PAD with mild claudication Remote right renal artery and bilateral iliac artery stenting.  April 2013  Rt SFA atherectomy. Right SFA calcified proximal 90% stenosis. Dopplers done 11/26/12 suggest restenosis of his Rt SFA. 4. COPD with centrilobular emphysema and ongoing tobacco use disorder.  Recommendation: Hold Coumadin for now, then proceed with further GI evaluation with low risk from cardiac standpoint.  I have discussed with the patient that if GI workup is negative and there is no obvious source, we could certainly continue anticoagulation. There is no history of fall, patient extremely concerned about stroke which is quite understandable. In spite of multiple medical comorbidities including PAD and underlying COPD with ongoing tobacco use, he is done fairly well. I'll continue to encourage him for complete cessation of tobacco use is call if questions.   Adrian Prows, MD 05/06/2017, 10:01 AM [facility]. [place] Pager: [phone] Office: [phone] If no answer: Cell:   [phone]

## 2017-05-06 NOTE — Consult Note (Signed)
[facility] Renal Consultation Note    Indication for Consultation:  Management of ESRD/hemodialysis; anemia, hypertension/volume and secondary hyperparathyroidism PCP: [name], MD  HPI: Ronald [name] is a 84 y.o. male with ESRD who just started HD 04/17/17 at [facility]  following a recent hospital discharge 04/26/17.  He has had two incenter HD treatments.  PMHx is significant for  AVR, DM, HTN chronic Afib, RAS with stent, hx lower GIB, COPD, PAD, tobacco abuse.  Palliative care was consulted last admission and patient wished to pursue all treatments.   Hgb was 9.3 3/4 and 9.1 3/6 at his outpatient HD unit.  tsat was 28%. A family member reported to nursing 3/8 that he had been having dark stools.  He has been on no heparin HD due to prior GI history.  The [facility]'s nursing facility[name] was notified to check FOBT.  He has been leaving below EDW at outpatient HD without BP drops so EDW was lowered to 69.5 last week.  The patient did not go to dialysis Monday as scheduled and presented to the ED with melanic stools. He really cannot give me a background about how he got to the ED yesterday, except that he thought he was coming to the hospital for dialysis.  Hgb today is the same as last week at outpatient unit.  Tsat is low at 18% B12 and folate normal. FOBT positive. BUN not markely elevated at 43 Cr 7. Stools noted to be dark last week - tarry for about 5 weeks - BRB in the past with nose bleeds a few times a month on chronic coumadin. He denies any abdominal pain, SOB, CP, edema,  fever or chills.  He denies any problems with HD. He makes very little urine and without hematuria.  His is accompanied by his younger brother from [place] and he has a granddaughter who is an OT on [place].   Past Medical History:  Diagnosis Date  . AS (aortic stenosis)    bovine aortic valve replacement 01/07/11  . Bladder cancer [facility])    Bladder Cancer local  . Blood transfusion    w/hip operation  . Cellulitis of left lower extremity   . Chronic atrial fibrillation ([place])   . Chronic diastolic congestive heart failure ([place])   . Claudication in peripheral vascular disease ([place]) 06/17/2011  . COPD (chronic obstructive pulmonary disease) ([place])   . Depression wife died 4 years ago.    Marland Kitchen History of stomach ulcers ~ 1951  . HTN (hypertension)   . Neuropathy due to secondary diabetes ([place])   . Peripheral vascular disease ([place]) very poor circulation legs and feet ... stents right and left legs... done in dr j. Gwenlyn Found 's office.   . Pneumonia 07/25/11   left  . Recurrent upper respiratory infection (URI)    sinusitis  . Renal artery stenosis ([place]) 2006   renal artery stent  . S/P angioplasty with stent, diamond back rotational athrectomy Prox. Rt. SFA 06/17/2011 06/17/2011  . Shortness of breath   . Thrombocytopenia due to drugs    seen by Dr Inda Merlin plts [id number] no rx  . Type II diabetes mellitus ([place])    Past Surgical History:  Procedure Laterality Date  . ABDOMINAL ANGIOGRAM  06/17/2011   Procedure: ABDOMINAL ANGIOGRAM;  Surgeon: Lorretta Harp, MD;  Location: [facility] CATH LAB;  Service: Cardiovascular;;  . AORTIC VALVE REPLACEMENT  01/07/2011   Procedure: AORTIC VALVE REPLACEMENT (AVR);  Surgeon: Grace Isaac, MD;  Location[facility];  Service: Open Heart Surgery;  Laterality: N/A;; magna-ease bovine 22mm bioprosthesis  . ATHERECTOMY N/A 06/17/2011   Procedure: ATHERECTOMY;  Surgeon: Lorretta Harp, MD;  Location: [facility] CATH LAB;  Service: Cardiovascular;  Laterality: N/A;  . AV FISTULA PLACEMENT Left 04/24/2017   Procedure: INSERTION OF ARTERIOVENOUS (AV) GORE-TEX GRAFT ARM;  Surgeon: Conrad [place]sville, MD;  Location: [place];  Service: Vascular;  Laterality: Left;  . CARDIAC CATHETERIZATION  11/19/10   normal coronaries, mod AS, 75% l RAS  . CARDIOVERSION  04/03/2011   Procedure: CARDIOVERSION;  Surgeon: Leonie Man, MD;  Location: [place];  Service: Cardiovascular;   Laterality: N/A;  . CATARACT EXTRACTION W/ INTRAOCULAR LENS  IMPLANT, BILATERAL  ~ 2007  . CHOLECYSTECTOMY    . FEMUR IM NAIL  06/27/2011   Procedure: INTRAMEDULLARY (IM) NAIL FEMORAL;  Surgeon: Marin Shutter, MD;  [facility];  Service: Orthopedics;  Laterality: Right;  . FEMUR IM NAIL Left 12/14/2013   Procedure: INTRAMEDULLARY (IM) NAIL FEMORAL;  Surgeon: Mauri Pole, MD;  [facility];  Service: Orthopedics;  Laterality: Left;  . FRACTURE SURGERY    . IR FLUORO GUIDE CV LINE LEFT  04/17/2017  . IR US GUIDE VASC ACCESS LEFT  04/17/2017  . LOWER EXTREMITY ANGIOGRAM  06/17/2011   diamondback orbital rotational and cutting balloon atherectomy of the prox R SFA  . LOWER EXTREMITY ANGIOGRAM Bilateral 06/17/2011   Procedure: LOWER EXTREMITY ANGIOGRAM;  Surgeon: Lorretta Harp, MD;  Location: [facility] CATH LAB;  Service: Cardiovascular;  Laterality: Bilateral;  . PERIPHERAL ARTERIAL STENT GRAFT     2006 left anf right illiac stents Dr Deon Pilling  . RENAL ARTERY STENT  2006   "I believe"  . REVERSE SHOULDER ARTHROPLASTY Left 12/31/2016   Procedure: LEFT REVERSE SHOULDER ARTHROPLASTY;  Surgeon: Nicholes Stairs, MD;  Location: [place];  Service: Orthopedics;  Laterality: Left;  . TONSILLECTOMY AND ADENOIDECTOMY     "when I was a kid"   Family History  Problem Relation Age of Onset  . Heart disease Mother   . Cancer Brother        colon  . Stroke Father    Social History:  reports that he has been smoking cigarettes.  He has a 112.50 pack-year smoking history. he has never used smokeless tobacco. He reports that he does not drink alcohol or use drugs. Allergies  Allergen Reactions  . Aspirin Anaphylaxis, Shortness Of Breath, Rash and Other (See Comments)    "broke out in white welts; red blotches neck and face; windpipe closed; ~ 1962"  . Tape Other (See Comments)    Patient's skin is VERY THIN and tears and bruises easily!!   Prior to Admission medications   Medication Sig Start  Date End Date Taking? Authorizing Provider  acetaminophen (TYLENOL) 325 MG tablet Take 650 mg by mouth every 6 (six) hours as needed ("for pain score 1-4; not to exceed #8 tablets in 24 hours").    Yes [provider]  albuterol (ACCUNEB) 0.63 MG/3ML nebulizer solution Take 3 mLs by nebulization every 6 (six) hours as needed for wheezing or shortness of breath.   Yes [provider]  buPROPion (WELLBUTRIN XL) 150 MG 24 hr tablet Take 150 mg by mouth daily.    Yes [provider]  diphenhydrAMINE (BENADRYL) 12.5 MG/5ML elixir Take 5 mLs (12.5 mg total) by mouth every 6 (six) hours as needed for itching. Over the counter Patient taking differently: Take  12.5 mg by mouth every 6 (six) hours as needed for itching.  04/25/17  Yes Rai, Ripudeep K, MD  doxazosin (CARDURA) 2 MG tablet Take 1 tablet (2 mg total) by mouth at bedtime. 04/25/17  Yes Rai, Ripudeep K, MD  ferrous sulfate 325 (65 FE) MG EC tablet Take 1 tablet (325 mg total) by mouth 2 (two) times daily. 07/04/16 07/04/17 Yes Lavina Hamman, MD  finasteride (PROSCAR) 5 MG tablet Take 5 mg by mouth at bedtime.    Yes [provider]  fluticasone (FLONASE) 50 MCG/ACT nasal spray Place 1 spray into both nostrils 2 (two) times daily.    Yes [provider]  glipiZIDE (GLUCOTROL) 5 MG tablet Take 5 mg by mouth daily before breakfast.   Yes [provider]  loratadine (CLARITIN) 10 MG tablet Take 10 mg by mouth daily.   Yes [provider]  Melatonin 5 MG TABS Take 5 mg by mouth at bedtime.   Yes [provider]  metoprolol succinate (TOPROL-XL) 25 MG 24 hr tablet Take 0.5 tablets (12.5 mg total) by mouth 2 (two) times daily with a meal. 04/26/17  Yes Rai, Ripudeep K, MD  Multiple Vitamins-Minerals (MULTIVITAMIN WITH MINERALS) tablet Take 1 tablet by mouth at bedtime.   Yes [provider]  OXYGEN Inhale 2 L into the lungs continuous.   Yes [provider]  simvastatin  (ZOCOR) 20 MG tablet Take 20 mg by mouth every evening.   Yes [provider]  traMADol (ULTRAM) 50 MG tablet Take 1 tablet (50 mg total) by mouth every 12 (twelve) hours as needed for moderate pain. Patient taking differently: Take 50 mg by mouth 2 (two) times daily.  04/25/17  Yes Rai, Ripudeep K, MD  Umeclidinium-Vilanterol (ANORO ELLIPTA) 62.5-25 MCG/INH AEPB Inhale 1 puff into the lungs daily.   Yes [provider]  warfarin (COUMADIN) 2 MG tablet Take 1-2 mg by mouth See admin instructions. Take 2 mg by mouth in the evening on Sun/Mon/Wed/Fri/Sat and 1 mg on Tues/Thurs 12/15/16  Yes [provider]  glyBURIDE (DIABETA) 5 MG tablet Take 1 tablet (5 mg total) by mouth daily with breakfast. Patient not taking: Reported on 05/05/2017 04/25/17   Rai, Vernelle Emerald, MD  multivitamin (RENA-VIT) TABS tablet Take 1 tablet by mouth at bedtime. Patient not taking: Reported on 05/05/2017 04/25/17   Mendel Corning, MD   Current Facility-Administered Medications  Medication Dose Route Frequency Provider Last Rate Last Dose  . acetaminophen (TYLENOL) tablet 650 mg  650 mg Oral Q6H PRN [name], MD       Or  . acetaminophen (TYLENOL) suppository 650 mg  650 mg Rectal Q6H PRN Doutova, Anastassia, MD      . albuterol (PROVENTIL) (2.5 MG/3ML) 0.083% nebulizer solution 3 mL  3 mL Nebulization Q6H PRN Doutova, Anastassia, MD      . buPROPion (WELLBUTRIN XL) 24 hr tablet 150 mg  150 mg Oral Daily Doutova, Anastassia, MD   150 mg at 05/06/17 0926  . doxazosin (CARDURA) tablet 2 mg  2 mg Oral QHS Doutova, Anastassia, MD      . feeding supplement (ENSURE ENLIVE) (ENSURE ENLIVE) liquid 237 mL  237 mL Oral BID BM Roney Jaffe, MD   237 mL at 05/06/17 0927  . ferric gluconate (NULECIT) 125 mg in sodium chloride 0.9 % 100 mL IVPB  125 mg Intravenous Daily Alric Seton, PA-C      . finasteride (PROSCAR) tablet 5 mg  5 mg  Oral QHS [name], MD      . HYDROcodone-acetaminophen  (NORCO/VICODIN) 5-325 MG per tablet 1-2 tablet  1-2 tablet Oral Q4H PRN Doutova, Anastassia, MD      . insulin aspart (novoLOG) injection 0-9 Units  0-9 Units Subcutaneous Q4H [name], MD   Stopped at 05/05/17 2358  . metoprolol succinate (TOPROL-XL) 24 hr tablet 12.5 mg  12.5 mg Oral BID WC Doutova, Anastassia, MD   12.5 mg at 05/06/17 0926  . ondansetron (ZOFRAN) tablet 4 mg  4 mg Oral Q6H PRN [name], MD       Or  . ondansetron (ZOFRAN) injection 4 mg  4 mg Intravenous Q6H PRN Doutova, Anastassia, MD      . pantoprazole (PROTONIX) EC tablet 40 mg  40 mg Oral Q0600 Glyn Ade, Sarah J, PA-C      . simvastatin (ZOCOR) tablet 20 mg  20 mg Oral QPM Doutova, Anastassia, MD      . traMADol (ULTRAM) tablet 50 mg  50 mg Oral BID [name], MD   50 mg at 05/06/17 0926   Labs: Basic Metabolic Panel: Recent Labs  Lab 05/05/17 1400 05/06/17 0224  NA 133* 135  K 4.5 4.3  CL 95* 98*  CO2 25 23  GLUCOSE 237* 149*  BUN 36* 43*  CREATININE 6.47* 7.04*  CALCIUM 8.9 8.8*  PHOS  --  4.7*   Liver Function Tests: Recent Labs  Lab 05/05/17 1400 05/06/17 0224  AST 23 18  ALT 13* 11*  ALKPHOS 86 74  BILITOT 0.5 0.7  PROT 7.0 6.4*  ALBUMIN 2.9* 2.7*    CBC: Recent Labs  Lab 05/05/17 1400 05/06/17 0224  WBC 9.4 10.5  HGB 10.0* 9.2*  HCT 31.5* 28.4*  MCV 96.9 95.0  PLT 195 164    CBG: Recent Labs  Lab 05/05/17 2343 05/06/17 0546 05/06/17 0727 05/06/17 1049  GLUCAP 229* 101* 95 127*   Iron Studies:  Recent Labs    05/06/17 0224  IRON 23*  TIBC 125*  FERRITIN 126   Studies/Results: No results found.  ROS: As per HPI . He is vague on details.  Physical Exam: Vitals:   05/06/17 0736 05/06/17 0816 05/06/17 1035 05/06/17 1050  BP:  (!) 145/82    Pulse:  87    Resp:  16    Temp: 98.2 F (36.8 C) 98.2 F (36.8 C)  98.4 F (36.9 C)  TempSrc: Oral Oral  Oral  SpO2:  100% 99%   Weight:      Height:         General: elderly somewhat  frail slender WM NAD breathing easily on room air Head: NCAT sclera not icteric MMM. HOH Neck: Supple. No JVD Lungs: CTA bilaterally without wheezes, rales, or rhonchi. Breathing is unlabored. Heart: RRR 2/6 murmur Abdomen: soft NT + BS Lower extremities:without edema or ischemic changes, no open wounds  Neuro/:  Moves all extremities spontaneously.  Responds to questions, speech clear but vague on details e.g. What prompted him to come to the ED yesterday and how he got here Dialysis Access: left IJ TDC and left upper AVGG + bruit  Dialysis Orders: [facility] MWF 4 hr 2K 2.5 Ca 400/800 EDW 69.5 no heparin venofer 50/week Mircera 100 q 2 wks last 3/6 left IJ TDC and left upper AVGG placed 2/28 Recent labs: hgb 9.3 3/4 3.1 3/6 28% sat 3/4 iPTH 102  Assessment/Plan: 1. GIB- no heparin HD - GI following- seems more chronic than acute For  EGD once INR down. On PPI. 2. ESRD -  MWF - off schedule secondary to missed treatemtn- HD today off schedule for now 3. Hypertension/volume  - titrating EDW/current meds- avoid BP drop 4. Anemia  - hgb actually stable from last week - tsat low - replete via IV Fe cotinue ESA - not due for redose until 3/20; if it drops considerable transfuse prn- I believe we told his living facility to stop po Fe last Friday 5. Metabolic bone disease - iPTH 102 - no VDRA needed/ not on binders yet 6. Nutrition - renal carb mod/vits/supplements 7. AVR with bovine bioprosthesis - 8. afib- on MTP - holding coumadin per cards pending GI eval 9.   PAD 10. Tobacco abuse - needs to stop   Myriam Jacobson, PA-C [place] [phone] 05/06/2017, 11:30 AM

## 2017-05-06 NOTE — Social Work (Signed)
CSW has spoken to [name] representative [facility]. Pt is a resident there and they are following regarding pt discharge. Pt contact according to facility is daughter Ronald [name] phone # [phone].  CSW continuing to follow.   [place], [place] Work [phone]

## 2017-05-06 NOTE — Progress Notes (Signed)
Initial Nutrition Assessment  DOCUMENTATION CODES:   Not applicable  INTERVENTION:  Discontinue Ensure Enlive po BID, each supplement provides 350 kcal and 20 grams of protein  Provide Nepro Shake po BID, each supplement provides 425 kcal and 19 grams protein.  Encourage adequate PO intake.   NUTRITION DIAGNOSIS:   Increased nutrient needs related to chronic illness as evidenced by estimated needs.  GOAL:   Patient will meet greater than or equal to 90% of their needs  MONITOR:   PO intake, Supplement acceptance, Labs, Weight trends, I & O's, Skin  REASON FOR ASSESSMENT:   Malnutrition Screening Tool    ASSESSMENT:   84 y.o. male with medical history significant of a.fib on coumadin, ESRD on HD on MWF at [facility], chronic diastolic CHF, bioprosthetic AVR, COPD, HTN, DM2, GI bleed. Presents with melena.  Meal completion has been 10%. Pt reports appetite is fine with no other difficulties. Pt reports consume at least 3 meals a day, however does not eat much at meals. Pt unable to quantify amount of food usually eaten. Pt does report consuming Ensure or Boost on occasion. Pt currently has Ensure ordered and pt has been consuming them. Noted phosphorous elevated at 4.7. RD to order Nepro shake instead rather than Ensure as phosphorous content is lower. Pt with a 11% weight loss in 2 months. Permanent HD started 04/24/17. Weight loss likely related to fluid status. Pt encouraged to eat his food at meals.   Labs and medications reviewed.   NUTRITION - FOCUSED PHYSICAL EXAM: Depletion likely related to the natural aging process.    Most Recent Value  Orbital Region  Unable to assess  Upper Arm Region  No depletion  Thoracic and Lumbar Region  No depletion  Buccal Region  Unable to assess  Temple Region  Unable to assess  Clavicle Bone Region  Severe depletion  Clavicle and Acromion Bone Region  Moderate depletion  Scapular Bone Region  Unable to assess  Dorsal Hand  Unable  to assess  Patellar Region  Mild depletion  Anterior Thigh Region  Mild depletion  Posterior Calf Region  Mild depletion  Edema (RD Assessment)  Mild  Hair  Reviewed  Eyes  Reviewed  Mouth  Reviewed  Skin  Reviewed  Nails  Reviewed       Diet Order:  Diet renal/carb modified with fluid restriction Diet-HS Snack? Nothing; Fluid restriction: 1200 mL Fluid; Room service appropriate? Yes; Fluid consistency: Thin  EDUCATION NEEDS:   Not appropriate for education at this time  Skin:  Skin Assessment: Skin Integrity Issues: Skin Integrity Issues:: Stage II Stage II: coccyx  Last BM:  3/12  Height:   Ht Readings from Last 1 Encounters:  05/06/17 5\' 11"  (1.803 m)    Weight:   Wt Readings from Last 1 Encounters:  05/06/17 151 lb 3.8 oz (68.6 kg)    Ideal Body Weight:  78 kg  BMI:  Body mass index is 21.09 kg/m.  Estimated Nutritional Needs:   Kcal:  9211-9417  Protein:  75-90 grams  Fluid:  1.2 L/day    Corrin Parker, MS, RD, LDN Pager # [phone] After hours/ weekend pager # [phone]

## 2017-05-06 NOTE — Progress Notes (Signed)
[facility] Progress Note  Brief Summary: Ronald [name] is a 84 y.o. male with medical history significant of a.fib on coumadin, ESRD on HD on MWF at [facility], chronic diastolic CHF, bioprosthetic AVR, COPD, HTN, DM2, Gi bleed.  Pt presented with melena. Last week relative was helping him in the bathroom and noted black stools. Its unclear for how long this has been happening.  Denies any chest pain has shortness of breath mainly with exertion. Reports that his stool has been black for 1 month no bright blood in urine.     At end of February admitted for tarry stools at that time had elevated INR up to 4 his Coumadin was discontinued and GI recommended no intervention at that time.  He developed AKI initially palliative care was consulted but after discussion with family and patient he was started on hemodialysis on 04/17/17.  During that admission GN serologic w/u was negative.  Renal ultrasound showed no obstruction and hydronephrosis patient continued to be on hemodialysis and underwent permanent access placement on  28 February and was dc'd to continue HD in OP setting.    Subjective: pt is alert and w/o complaints today  Vitals:   05/06/17 0736 05/06/17 0816 05/06/17 1035 05/06/17 1050  BP:  (!) 145/82    Pulse:  87    Resp:  16    Temp: 98.2 F (36.8 C) 98.2 F (36.8 C)  98.4 F (36.9 C)  TempSrc: Oral Oral  Oral  SpO2:  100% 99%   Weight:      Height:        Inpatient medications: . buPROPion  150 mg Oral Daily  . doxazosin  2 mg Oral QHS  . feeding supplement (ENSURE ENLIVE)  237 mL Oral BID BM  . finasteride  5 mg Oral QHS  . insulin aspart  0-9 Units Subcutaneous Q4H  . metoprolol succinate  12.5 mg Oral BID WC  . [START ON 05/07/2017] pantoprazole  40 mg Oral Daily  . simvastatin  20 mg Oral QPM  . traMADol  50 mg Oral BID   . ferric gluconate (FERRLECIT/NULECIT) IV     acetaminophen **OR** acetaminophen, albuterol, HYDROcodone-acetaminophen, ondansetron **OR**  ondansetron (ZOFRAN) IV  Exam: Alert, elderly WM, HOH, no distress, calm No jvd Chest clear bilat RRR no mrg Abd soft ntnd no mass or ascites Ext no edema of LE's or UE's L IJ HD cath NF, Ox 3   Impression/Plan:   #) Black stool/ guiac positive:  in setting of oral Fe Rx.  R/O GI bleed, GI consult this am recommends agree w holding coumadin, do EGD when INR is down.  PO protonix for now - Hb 10 > 9.2 here, no need for transfusion.   #) Chronic atrial fibrillation: per cardiology ok to hold coumadin and proceed with GI work up.  CHADS-VASc score is 4.  OK to resume coumadin if GI w/u negative, pt not fall risk at this time.    #)  ESRD - on HD since mid February. Per Renal. For HD today.   #)  HTN essential - cont meds   #)  PVD hx R SFA PTA/ HSRA in April 2013  #)  COPD: stable cont home meds  #)  Tobacco use: pt not interested in cessation  Kelly Splinter MD [facility] pgr [phone] 05/06/2017, 11:57 AM   Code Status: full DVT Prophylaxis: SCD Family Communication: no family here today Disposition Plan: Back to current facility when stable  Status: inpatient  Treatments:              -hemodialysis today  Procedures: -none  Consults: -none      Recent Labs  Lab 05/05/17 1400 05/06/17 0224  NA 133* 135  K 4.5 4.3  CL 95* 98*  CO2 25 23  GLUCOSE 237* 149*  BUN 36* 43*  CREATININE 6.47* 7.04*  CALCIUM 8.9 8.8*  PHOS  --  4.7*   Recent Labs  Lab 05/05/17 1400 05/06/17 0224  AST 23 18  ALT 13* 11*  ALKPHOS 86 74  BILITOT 0.5 0.7  PROT 7.0 6.4*  ALBUMIN 2.9* 2.7*   Recent Labs  Lab 05/05/17 1400 05/06/17 0224  WBC 9.4 10.5  HGB 10.0* 9.2*  HCT 31.5* 28.4*  MCV 96.9 95.0  PLT 195 164   Iron/TIBC/Ferritin/ %Sat    Component Value Date/Time   IRON 23 (L) 05/06/2017 0224   TIBC 125 (L) 05/06/2017 0224   FERRITIN 126 05/06/2017 0224   IRONPCTSAT 18 05/06/2017 0224   IRONPCTSAT 28 03/13/2006 1323

## 2017-05-07 DIAGNOSIS — D689 Coagulation defect, unspecified: Secondary | ICD-10-CM

## 2017-05-07 DIAGNOSIS — I10 Essential (primary) hypertension: Secondary | ICD-10-CM

## 2017-05-07 DIAGNOSIS — Z720 Tobacco use: Secondary | ICD-10-CM

## 2017-05-07 DIAGNOSIS — J432 Centrilobular emphysema: Secondary | ICD-10-CM

## 2017-05-07 DIAGNOSIS — K921 Melena: Principal | ICD-10-CM

## 2017-05-07 LAB — GLUCOSE, CAPILLARY
GLUCOSE-CAPILLARY: 174 mg/dL — AB (ref 65–99)
GLUCOSE-CAPILLARY: 76 mg/dL (ref 65–99)
Glucose-Capillary: 111 mg/dL — ABNORMAL HIGH (ref 65–99)
Glucose-Capillary: 171 mg/dL — ABNORMAL HIGH (ref 65–99)
Glucose-Capillary: 48 mg/dL — ABNORMAL LOW (ref 65–99)
Glucose-Capillary: 67 mg/dL (ref 65–99)
Glucose-Capillary: 93 mg/dL (ref 65–99)

## 2017-05-07 LAB — CBC
HEMATOCRIT: 27 % — AB (ref 39.0–52.0)
HEMOGLOBIN: 8.8 g/dL — AB (ref 13.0–17.0)
MCH: 31.1 pg (ref 26.0–34.0)
MCHC: 32.6 g/dL (ref 30.0–36.0)
MCV: 95.4 fL (ref 78.0–100.0)
Platelets: 138 10*3/uL — ABNORMAL LOW (ref 150–400)
RBC: 2.83 MIL/uL — AB (ref 4.22–5.81)
RDW: 18.6 % — ABNORMAL HIGH (ref 11.5–15.5)
WBC: 7.9 10*3/uL (ref 4.0–10.5)

## 2017-05-07 LAB — RENAL FUNCTION PANEL
ALBUMIN: 2.4 g/dL — AB (ref 3.5–5.0)
Anion gap: 12 (ref 5–15)
BUN: 52 mg/dL — ABNORMAL HIGH (ref 6–20)
CO2: 23 mmol/L (ref 22–32)
CREATININE: 7.94 mg/dL — AB (ref 0.61–1.24)
Calcium: 8.5 mg/dL — ABNORMAL LOW (ref 8.9–10.3)
Chloride: 98 mmol/L — ABNORMAL LOW (ref 101–111)
GFR, EST AFRICAN AMERICAN: 6 mL/min — AB (ref >60)
GFR, EST NON AFRICAN AMERICAN: 5 mL/min — AB (ref >60)
Glucose, Bld: 105 mg/dL — ABNORMAL HIGH (ref 65–99)
Phosphorus: 4.2 mg/dL (ref 2.5–4.6)
Potassium: 4.8 mmol/L (ref 3.5–5.1)
Sodium: 133 mmol/L — ABNORMAL LOW (ref 135–145)

## 2017-05-07 LAB — PROTIME-INR
INR: 2.45
PROTHROMBIN TIME: 26.4 s — AB (ref 11.4–15.2)

## 2017-05-07 NOTE — Progress Notes (Signed)
Called received from Ronald [name], [place] [facility] to make patient NPO past 0900 tomorrow 05/08/2017 for 1400 EGD. May have clear liquid for breakfast. Orders entered  En Epic. Elita Boone, BSN, RN

## 2017-05-07 NOTE — Progress Notes (Signed)
OT Treatment  PTA, pt was living at an ALF and receiving assistance for bathing and dressing and required RW to transfer to scooter. Pt currently requiring Min A for UB ADLs, Mod-Max A for LB ADLs, and Min A for functional mobility with RW. Pt presenting with decreased balance as seen by 2-3 LOB requiring Min A for correction and is a fall risk. Pt would benefit from further acute OT to facilitate safe dc. Recommend return to ALF with assistance and follow up with [facility] for further OT to optimize safety and independence with ADLs and functional mobility.       05/07/17 1300  OT Visit Information  Last OT Received On 05/07/17  Assistance Needed +1  History of Present Illness 84 y.o. male admitted for melena. PMhx: Afib on coumadin, ESRD, chronic diastolic CHF, AVR, COPD, HTN, DM2, Gi bleed, PVD  Precautions  Precautions Fall  Precaution Comments watch for lightheadedness  Restrictions  Weight Bearing Restrictions No  Home Living  Family/patient expects to be discharged to: [place] - 2 wheels;Walker - 4 wheels;Cane - single point;Electric scooter;Grab bars - tub/shower;Wheelchair - Agricultural consultant Comments pt from [facility]F.  Prior Function  Level of Independence Needs assistance  Gait / Transfers Assistance Needed pt reports he transfers to scooter for moving room to room and for majority of mobility. Walks very short distances <40' with AD  ADL's / Homemaking Assistance Needed Son reports that staff helps with BADLs in morning. Pt reports he bathes and dresses himself because staff take too long, staff do the cooking and cleaning  Comments Son present. 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 Family drives him.   Communication  Communication HOH (speak into Right ear)  Pain Assessment  Pain Assessment Faces  Faces Pain Scale 2   Pain Location Itching at back and chest  Pain Descriptors / Indicators Grimacing;Guarding  Pain Intervention(s) Monitored during session;Repositioned  Cognition  Arousal/Alertness Awake/alert  Behavior During Therapy WFL for tasks assessed/performed  Overall Cognitive Status Within Functional Limits for tasks assessed  Difficult to assess due to Hard of hearing/deaf  Upper Extremity Assessment  Upper Extremity Assessment LUE deficits/detail  LUE Deficits / Details L reverse TSA, limited motion of arm. WFL grasp strength and FM skills  LUE Coordination decreased gross motor  Lower Extremity Assessment  Lower Extremity Assessment Defer to PT evaluation  RLE Deficits / Details observed decreased R ankle DF during ambulation  Cervical / Trunk Assessment  Cervical / Trunk Assessment Kyphotic  ADL  Overall ADL's  Needs assistance/impaired  Eating/Feeding Set up;Sitting  Grooming Set up;Sitting  Grooming Details (indicate cue type and reason) Poor activity tolerance and balance for grooming in standing  Upper Body Bathing Minimal assistance;Sitting  Lower Body Bathing Moderate assistance;Sit to/from stand  Upper Body Dressing  Minimal assistance;Sitting  Upper Body Dressing Details (indicate cue type and reason) Poor ROM of LUE  Lower Body Dressing Maximal assistance;Sit to/from stand  Lower Body Dressing Details (indicate cue type and reason) Max A due to limited reach down. Required Max A to adjust socks. Mod A for dyanmic standing balance  Toilet Transfer Minimal assistance;Ambulation;BSC;RW  Functional mobility during ADLs Minimal assistance;Rolling walker  General ADL Comments Pt performing requiring mod-max A for LB ADLs and Min A for funcitonal mobility with RW. Pt fall risk with 2-3 small LOB  and required Min A to correct  Bed Mobility  Overal bed mobility Needs Assistance  Bed Mobility Supine to Sit  Supine to sit Min assist  Sit to supine Min assist  General bed mobility  comments min assist to elevate trunk from surface with increased time and cues, HOB 30 degrees  Transfers  Overall transfer level Needs assistance  Equipment used Rolling walker (2 wheeled)  Transfers Sit to/from Stand  Sit to Stand Min assist  Stand pivot transfers Mod assist;+2 safety/equipment  General transfer comment assist to rise and to control descent to surface with cues for hand placement  Balance  Overall balance assessment Needs assistance  Sitting-balance support Feet supported;No upper extremity supported  Sitting balance-Leahy Scale Good  Standing balance support Bilateral upper extremity supported  Standing balance-Leahy Scale Poor  Standing balance comment requires UE support  General Comments  General comments (skin integrity, edema, etc.) Son present throughout session  OT - End of Session  Equipment Utilized During Treatment Gait belt;Rolling walker  Activity Tolerance Patient limited by fatigue  Patient left in chair;with call bell/phone within reach;with chair alarm set;with family/visitor present;with nursing/sitter in room  Nurse Communication Mobility status  OT Assessment  OT Recommendation/Assessment Patient needs continued OT Services  OT Visit Diagnosis Unsteadiness on feet (R26.81);Other abnormalities of gait and mobility (R26.89);Muscle weakness (generalized) (M62.81)  OT Problem List Decreased strength;Decreased range of motion;Decreased activity tolerance;Impaired balance (sitting and/or standing);Decreased knowledge of use of DME or AE;Decreased safety awareness;Decreased knowledge of precautions;Pain  OT Plan  OT Frequency (ACUTE ONLY) Min 2X/week  OT Treatment/Interventions (ACUTE ONLY) Self-care/ADL training;Therapeutic exercise;Energy conservation;DME and/or AE instruction;Therapeutic activities;Patient/family education  AM-PAC OT "6 Clicks" Daily Activity Outcome Measure  Help from another person eating meals? 4  Help from another person taking  care of personal grooming? 3  Help from another person toileting, which includes using toliet, bedpan, or urinal? 3  Help from another person bathing (including washing, rinsing, drying)? 2  Help from another person to put on and taking off regular upper body clothing? 3  Help from another person to put on and taking off regular lower body clothing? 2  6 Click Score 17  ADL G Code Conversion CK  OT Recommendation  Recommendations for Other Services PT consult  Follow Up Recommendations Home health OT;Supervision/Assistance - 24 hour (Return to ALF with assistance)  OT Equipment None recommended by OT  Individuals Consulted  Consulted and Agree with Results and Recommendations Patient  Acute Rehab OT Goals  Patient Stated Goal return home and to work  OT Goal Formulation With patient/family  Time For Goal Achievement 05/21/17  Potential to Achieve Goals Good  OT Time Calculation  OT Start Time (ACUTE ONLY) 1328  OT Stop Time (ACUTE ONLY) 1354  OT Time Calculation (min) 26 min  OT General Charges  $OT Visit 1 Visit  OT Evaluation  $OT Eval Moderate Complexity 1 Mod  OT Treatments  $Self Care/Home Management  8-22 mins  Written Expression  Dominant Hand Right   Emery, OTR/L Acute Rehab Pager: [phone] Office: [phone]

## 2017-05-07 NOTE — Progress Notes (Signed)
[name], male   DOB: 20-May-1922, 84 y.o.   MRN: [id number]  PROGRESS NOTE    Ronald [name]  IWP:[id number] DOB: [birth year] DOA: 05/05/2017 PCP: [name], MD   Brief Narrative:  84 year old male with history of atrial fibrillation on Coumadin, end-stage renal disease on hemodialysis, chronic diastolic CHF, bioprosthetic AVR, COPD, hypertension, diabetes mellitus type 2 and GI bleed presented with melena.  GI was consulted  Assessment & Plan:   Active Problems:   Diabetes mellitus with complication (HCC)   Hyperlipidemia   Essential hypertension   PVD, Rt SFA PTA/HSRA 06/17/11   S/P aortic valve replacement: #23 Magna Ease Edwards Pericardial Valve  November 2012   COPD (chronic obstructive pulmonary disease) (HCC)   Tobacco use   GI bleed   ESRD (end stage renal disease) ([place])   Probable upper GI bleeding presenting with melena -GI following.  Plan for EGD from GI once INR is less 1.8.  INR 2.45 today.  Repeat INR in a.m. -Continue Protonix. -Hemoglobin stable at 8.8 today  Chronic atrial fibrillation: per cardiology ok to hold coumadin and proceed with GI work up.  CHADS-VASc score is 4.  OK to resume coumadin if GI w/u negative, pt not fall risk at this time.   -Currently rate controlled.  Continue metoprolol  ESRD - on HD since mid February.  Nephrology following.  Dialysis as per nephrology schedule  HTN essential -continue metoprolol he did monitor blood pressure.  Currently stable  COPD: stable; continue home meds  Tobacco abuse: Patient not interested in cessation    DVT prophylaxis: SCDs Code Status: Full Family Communication: None at bedside Disposition Plan: Nursing home once cleared by GI  Consultants: GI/cardiology/nephrology  Procedures: None  Antimicrobials: None   Subjective: Patient seen and examined at bedside.  He is hard of hearing.  No overnight fever, vomiting.  Objective: Vitals:   05/07/17 1100 05/07/17 1131  05/07/17 1156 05/07/17 1201  BP: (!) 111/55 (!) 107/51 (!) 122/53   Pulse: 90 84 91   Resp: (!) 24 (!) 27 20   Temp:  97.8 F (36.6 C)  98.6 F (37 C)  TempSrc:  Oral  Oral  SpO2: 98% 98% 98%   Weight:  68.7 kg (151 lb 7.3 oz)    Height:        Intake/Output Summary (Last 24 hours) at 05/07/2017 1345 Last data filed at 05/07/2017 1131 Gross per 24 hour  Intake 350 ml  Output 2050 ml  Net -1700 ml   Filed Weights   05/06/17 0640 05/07/17 0721 05/07/17 1131  Weight: 68.6 kg (151 lb 3.8 oz) 71.2 kg (156 lb 15.5 oz) 68.7 kg (151 lb 7.3 oz)    Examination:  General exam: Elderly male lying in bed, awake.  Very hard of hearing Respiratory system: Bilateral decreased breath sound at bases Cardiovascular system: S1 & S2 heard, rate controlled  gastrointestinal system: Abdomen is nondistended, soft and nontender. Normal bowel sounds heard. Extremities: No cyanosis, clubbing, edema    Data Reviewed: I have personally reviewed following labs and imaging studies  CBC: Recent Labs  Lab 05/05/17 1400 05/06/17 0224 05/07/17 0418  WBC 9.4 10.5 7.9  HGB 10.0* 9.2* 8.8*  HCT 31.5* 28.4* 27.0*  MCV 96.9 95.0 95.4  PLT 195 164 505*   Basic Metabolic Panel: Recent Labs  Lab 05/05/17 1400 05/06/17 0224 05/07/17 0738  NA 133* 135 133*  K 4.5 4.3 4.8  CL 95* 98* 98*  CO2 25  23 23  GLUCOSE 237* 149* 105*  BUN 36* 43* 52*  CREATININE 6.47* 7.04* 7.94*  CALCIUM 8.9 8.8* 8.5*  MG  --  2.3  --   PHOS  --  4.7* 4.2   GFR: Estimated Creatinine Clearance: 5.6 mL/min (A) (by C-G formula based on SCr of 7.94 mg/dL (H)). Liver Function Tests: Recent Labs  Lab 05/05/17 1400 05/06/17 0224 05/07/17 0738  AST 23 18  --   ALT 13* 11*  --   ALKPHOS 86 74  --   BILITOT 0.5 0.7  --   PROT 7.0 6.4*  --   ALBUMIN 2.9* 2.7* 2.4*   No results for input(s): LIPASE, AMYLASE in the last 168 hours. No results for input(s): AMMONIA in the last 168 hours. Coagulation Profile: Recent Labs    Lab 05/05/17 1400 05/07/17 0418  INR 3.22 2.45   Cardiac Enzymes: No results for input(s): CKTOTAL, CKMB, CKMBINDEX, TROPONINI in the last 168 hours. BNP (last 3 results) No results for input(s): PROBNP in the last 8760 hours. HbA1C: Recent Labs    05/05/17 1400  HGBA1C 4.8   CBG: Recent Labs  Lab 05/06/17 2353 05/07/17 0041 05/07/17 0349 05/07/17 1216 05/07/17 1248  GLUCAP 48* 93 174* 67 76   Lipid Profile: No results for input(s): CHOL, HDL, LDLCALC, TRIG, CHOLHDL, LDLDIRECT in the last 72 hours. Thyroid Function Tests: Recent Labs    05/06/17 0224  TSH 1.096   Anemia Panel: Recent Labs    05/06/17 0224  VITAMINB12 1,071*  FOLATE 14.5  FERRITIN 126  TIBC 125*  IRON 23*  RETICCTPCT 2.9   Sepsis Labs: No results for input(s): PROCALCITON, LATICACIDVEN in the last 168 hours.  Recent Results (from the past 240 hour(s))  MRSA PCR Screening     Status: None   Collection Time: 05/06/17  6:55 AM  Result Value Ref Range Status   MRSA by PCR NEGATIVE NEGATIVE Final    Comment:        The GeneXpert MRSA Assay (FDA approved for NASAL specimens only), is one component of a comprehensive MRSA colonization surveillance program. It is not intended to diagnose MRSA infection nor to guide or monitor treatment for MRSA infections. Performed at [facility], [place] [address] [place], [place] [address]          Radiology Studies: No results found.      Scheduled Meds: . buPROPion  150 mg Oral Daily  . doxazosin  2 mg Oral QHS  . feeding supplement (NEPRO CARB STEADY)  237 mL Oral BID BM  . finasteride  5 mg Oral QHS  . insulin aspart  0-9 Units Subcutaneous Q4H  . metoprolol succinate  12.5 mg Oral BID WC  . multivitamin  1 tablet Oral QHS  . pantoprazole  40 mg Oral Daily  . simvastatin  20 mg Oral QPM  . traMADol  50 mg Oral BID   Continuous Infusions: . ferric gluconate (FERRLECIT/NULECIT) IV Stopped (05/06/17 1433)     LOS: 2 days         Aline August, MD [facility] Pager [phone]  If 7PM-7AM, please contact night-coverage [email or web address] Password [email or web address] 05/07/2017, 1:45 PM

## 2017-05-07 NOTE — Procedures (Signed)
I was present at this dialysis session. I have reviewed the session itself and made appropriate changes.   INR still up today.  Hb overall stable.  Using TDC Qb 350 UF goal 2L 2K bath.  Next HD 3/15.   Filed Weights   05/06/17 0640  Weight: 68.6 kg (151 lb 3.8 oz)    Recent Labs  Lab 05/07/17 0738  NA 133*  K 4.8  CL 98*  CO2 23  GLUCOSE 105*  BUN 52*  CREATININE 7.94*  CALCIUM 8.5*  PHOS 4.2    Recent Labs  Lab 05/05/17 1400 05/06/17 0224 05/07/17 0418  WBC 9.4 10.5 7.9  HGB 10.0* 9.2* 8.8*  HCT 31.5* 28.4* 27.0*  MCV 96.9 95.0 95.4  PLT 195 164 138*    Scheduled Meds: . buPROPion  150 mg Oral Daily  . doxazosin  2 mg Oral QHS  . feeding supplement (NEPRO CARB STEADY)  237 mL Oral BID BM  . finasteride  5 mg Oral QHS  . insulin aspart  0-9 Units Subcutaneous Q4H  . metoprolol succinate  12.5 mg Oral BID WC  . multivitamin  1 tablet Oral QHS  . pantoprazole  40 mg Oral Daily  . simvastatin  20 mg Oral QPM  . traMADol  50 mg Oral BID   Continuous Infusions: . ferric gluconate (FERRLECIT/NULECIT) IV Stopped (05/06/17 1433)   PRN Meds:.acetaminophen **OR** acetaminophen, albuterol, HYDROcodone-acetaminophen, ondansetron **OR** ondansetron (ZOFRAN) IV   Pearson Grippe  MD 05/07/2017, 8:37 AM

## 2017-05-07 NOTE — Progress Notes (Signed)
Hypoglycemic Event  CBG: 48      Time : 2353  Treatment: 1 Can Coke and 2   Symptoms: None  Follow-up CBG: OEHO:1224 CBG Result:93  Possible Reasons for Event: No Known Reasons, patient received only 1 unit of insulin at 2040.   Loleta Dicker

## 2017-05-07 NOTE — Progress Notes (Signed)
Pt not in room upon shift change report for assessment. Currently in hemodialysis since 0700 per night RN. Elita Boone, BSN, RN

## 2017-05-07 NOTE — Progress Notes (Signed)
  Progress Note    05/07/2017 9:36 AM * No surgery found *  Subjective:  Patient examined during dialysis treatment via L IJ PermCath.  He denies steal signs or symptoms L arm.  History difficult due to patient hard of hearing   Vitals:   05/07/17 0830 05/07/17 0900  BP: (!) 112/51 (!) 110/49  Pulse: 81 88  Resp: 18 (!) 25  Temp:    SpO2: 98% 98%   Physical Exam: Lungs:  Non labored Incisions:  L AC fossa and axilla incisions healing well with wound edges approximated well Extremities:  Palpable thrill and audible bruit L AV graft; palpable L radial Abdomen:  Soft Neurologic: baseline  CBC    Component Value Date/Time   WBC 7.9 05/07/2017 0418   RBC 2.83 (L) 05/07/2017 0418   HGB 8.8 (L) 05/07/2017 0418   HGB 13.4 02/12/2010 1214   HCT 27.0 (L) 05/07/2017 0418   HCT 39.1 02/12/2010 1214   PLT 138 (L) 05/07/2017 0418   PLT 114 (L) 02/12/2010 1214   MCV 95.4 05/07/2017 0418   MCV 95.8 02/12/2010 1214   MCH 31.1 05/07/2017 0418   MCHC 32.6 05/07/2017 0418   RDW 18.6 (H) 05/07/2017 0418   RDW 14.2 02/12/2010 1214   LYMPHSABS 0.9 03/10/2017 0853   LYMPHSABS 1.7 02/12/2010 1214   MONOABS 0.7 03/10/2017 0853   MONOABS 0.7 02/12/2010 1214   EOSABS 0.2 03/10/2017 0853   EOSABS 0.2 02/12/2010 1214   BASOSABS 0.0 03/10/2017 0853   BASOSABS 0.0 02/12/2010 1214    BMET    Component Value Date/Time   NA 133 (L) 05/07/2017 0738   K 4.8 05/07/2017 0738   CL 98 (L) 05/07/2017 0738   CO2 23 05/07/2017 0738   GLUCOSE 105 (H) 05/07/2017 0738   BUN 52 (H) 05/07/2017 0738   CREATININE 7.94 (H) 05/07/2017 0738   CREATININE 1.50 (H) 10/18/2015 1140   CALCIUM 8.5 (L) 05/07/2017 0738   GFRNONAA 5 (L) 05/07/2017 0738   GFRAA 6 (L) 05/07/2017 0738    INR    Component Value Date/Time   INR 2.45 05/07/2017 0418     Intake/Output Summary (Last 24 hours) at 05/07/2017 0936 Last data filed at 05/06/2017 2100 Gross per 24 hour  Intake 590 ml  Output 50 ml  Net 540 ml      Assessment/Plan:  84 y.o. male is s/p L arm AV graft 2 weeks post op   Ok to remove staples L AC fossa and axilla incisions Continue HD from PermCath for now Can start using AV graft in about 2-3 more weeks   Dagoberto Ligas, PA-C [facility] [phone] 05/07/2017 9:36 AM

## 2017-05-07 NOTE — Progress Notes (Signed)
OT Cancellation    05/07/17 0800  OT Visit Information  Last OT Received On 05/07/17  Reason Eval/Treat Not Completed Patient at procedure or test/ unavailable (HD. Will return as schedule allows.)   Cisco Kindt MSOT, OTR/L Acute Rehab Pager: [phone] Office: [phone]

## 2017-05-07 NOTE — H&P (View-Only) (Signed)
Daily Rounding Note  05/07/2017, 8:39 AM  LOS: 2 days   SUBJECTIVE:      No abd pain, no stools overnight or today per RN and pt says no BMs yesterday.  C/o pruritus on his back beginning 20 to 30 mins into this AMs HD session.    OBJECTIVE:         Vital signs in last 24 hours:    Temp:  [97.5 F (36.4 C)-99.4 F (37.4 C)] 98.7 F (37.1 C) (03/13 0400) Pulse Rate:  [82-89] 87 (03/13 0000) Resp:  [16-26] 23 (03/13 0400) BP: (121-140)/(52-81) 127/52 (03/13 0400) SpO2:  [95 %-99 %] 95 % (03/13 0000) Last BM Date: 05/06/17 Filed Weights   05/06/17 0640  Weight: 151 lb 3.8 oz (68.6 kg)   General: aged, frail, awakens easily   Heart: irreg, irreg Chest: cough, no dyspnea Abdomen: soft, NT, ND.  BS scant, no tinkling or tympanitic BS  Extremities: no CCE Neuro/Psych:  HOH.  Poor recall of events (? Reliability of his claims).  Moves all 4s.  No tremors.  Intake/Output from previous day: 03/12 0701 - 03/13 0700 In: 590 [P.O.:480; IV Piggyback:110] Out: 50 [Urine:50]  Intake/Output this shift: No intake/output data recorded.  Lab Results: Recent Labs    05/05/17 1400 05/06/17 0224 05/07/17 0418  WBC 9.4 10.5 7.9  HGB 10.0* 9.2* 8.8*  HCT 31.5* 28.4* 27.0*  PLT 195 164 138*   BMET Recent Labs    05/05/17 1400 05/06/17 0224 05/07/17 0738  NA 133* 135 133*  K 4.5 4.3 4.8  CL 95* 98* 98*  CO2 25 23 23   GLUCOSE 237* 149* 105*  BUN 36* 43* 52*  CREATININE 6.47* 7.04* 7.94*  CALCIUM 8.9 8.8* 8.5*   LFT Recent Labs    05/05/17 1400 05/06/17 0224 05/07/17 0738  PROT 7.0 6.4*  --   ALBUMIN 2.9* 2.7* 2.4*  AST 23 18  --   ALT 13* 11*  --   ALKPHOS 86 74  --   BILITOT 0.5 0.7  --    PT/INR Recent Labs    05/05/17 1400 05/07/17 0418  LABPROT 32.7* 26.4*  INR 3.22 2.45     Ref. Range 05/06/2017 02:24  Iron Latest Ref Range: 45 - 182 ug/dL 23 (L)  UIBC Latest Units: ug/dL 102  TIBC Latest  Ref Range: 250 - 450 ug/dL 125 (L)  Saturation Ratios Latest Ref Range: 17.9 - 39.5 % 18  Ferritin Latest Ref Range: 24 - 336 ng/mL 126  Folate Latest Ref Range: >5.9 ng/mL 14.5  Vitamin B12 Latest Ref Range: 180 - 914 pg/mL 1,071 (H)   Scheduled Meds: . buPROPion  150 mg Oral Daily  . doxazosin  2 mg Oral QHS  . feeding supplement (NEPRO CARB STEADY)  237 mL Oral BID BM  . finasteride  5 mg Oral QHS  . insulin aspart  0-9 Units Subcutaneous Q4H  . metoprolol succinate  12.5 mg Oral BID WC  . multivitamin  1 tablet Oral QHS  . pantoprazole  40 mg Oral Daily  . simvastatin  20 mg Oral QPM  . traMADol  50 mg Oral BID   Continuous Infusions: . ferric gluconate (FERRLECIT/NULECIT) IV Stopped (05/06/17 1433)   PRN Meds:.acetaminophen **OR** acetaminophen, albuterol, HYDROcodone-acetaminophen, ondansetron **OR** ondansetron (ZOFRAN) IV   ASSESMENT:   *  Melenic, FOBT + stools.  Seems to have abated for now, though previous pattern may have been that of  intermittent occurrence.    *  Chronic Coumadin for A fib, supratherapeutic INR.    *  Chronic anemia.  Anemia provile c/w anemia of chronic dz.   On oral iron chronically.  Received Ferrlecit 3/12 but none PTA.  Receives Mircera bi-weekly at HD.   Hgb drop 1.2 grams in last 2 days but stable c/w 2 weeks ago.   Renal asked that po iron stop as he received the  IV Fe.    *  ESRD, started MWF HD end of 03/2017.    *  Chronic thrombocytopenia.     *  A fib.     PLAN   *  EGD once INR </=to 1.8.  Earliest likely date is 3/14.  Continue carb mod diet.  CBC, coags in AM.     [name]  05/07/2017, 8:39 AM Pager: [phone]  GI ATTENDING  Interval history data reviewed. Agree with interval progress note as outlined. Patient was off to dialysis. Hemoglobin stable. No further GI bleeding. No bowel movements. INR 2.5. Upper endoscopy possibly tomorrow on his nondialysis today.  [name], M.D. [facility] Division  of Gastroenterology

## 2017-05-07 NOTE — Progress Notes (Addendum)
Daily Rounding Note  05/07/2017, 8:39 AM  LOS: 2 days   SUBJECTIVE:      No abd pain, no stools overnight or today per RN and pt says no BMs yesterday.  C/o pruritus on his back beginning 20 to 30 mins into this AMs HD session.    OBJECTIVE:         Vital signs in last 24 hours:    Temp:  [97.5 F (36.4 C)-99.4 F (37.4 C)] 98.7 F (37.1 C) (03/13 0400) Pulse Rate:  [82-89] 87 (03/13 0000) Resp:  [16-26] 23 (03/13 0400) BP: (121-140)/(52-81) 127/52 (03/13 0400) SpO2:  [95 %-99 %] 95 % (03/13 0000) Last BM Date: 05/06/17 Filed Weights   05/06/17 0640  Weight: 151 lb 3.8 oz (68.6 kg)   General: aged, frail, awakens easily   Heart: irreg, irreg Chest: cough, no dyspnea Abdomen: soft, NT, ND.  BS scant, no tinkling or tympanitic BS  Extremities: no CCE Neuro/Psych:  HOH.  Poor recall of events (? Reliability of his claims).  Moves all 4s.  No tremors.  Intake/Output from previous day: 03/12 0701 - 03/13 0700 In: 590 [P.O.:480; IV Piggyback:110] Out: 50 [Urine:50]  Intake/Output this shift: No intake/output data recorded.  Lab Results: Recent Labs    05/05/17 1400 05/06/17 0224 05/07/17 0418  WBC 9.4 10.5 7.9  HGB 10.0* 9.2* 8.8*  HCT 31.5* 28.4* 27.0*  PLT 195 164 138*   BMET Recent Labs    05/05/17 1400 05/06/17 0224 05/07/17 0738  NA 133* 135 133*  K 4.5 4.3 4.8  CL 95* 98* 98*  CO2 25 23 23   GLUCOSE 237* 149* 105*  BUN 36* 43* 52*  CREATININE 6.47* 7.04* 7.94*  CALCIUM 8.9 8.8* 8.5*   LFT Recent Labs    05/05/17 1400 05/06/17 0224 05/07/17 0738  PROT 7.0 6.4*  --   ALBUMIN 2.9* 2.7* 2.4*  AST 23 18  --   ALT 13* 11*  --   ALKPHOS 86 74  --   BILITOT 0.5 0.7  --    PT/INR Recent Labs    05/05/17 1400 05/07/17 0418  LABPROT 32.7* 26.4*  INR 3.22 2.45     Ref. Range 05/06/2017 02:24  Iron Latest Ref Range: 45 - 182 ug/dL 23 (L)  UIBC Latest Units: ug/dL 102  TIBC Latest  Ref Range: 250 - 450 ug/dL 125 (L)  Saturation Ratios Latest Ref Range: 17.9 - 39.5 % 18  Ferritin Latest Ref Range: 24 - 336 ng/mL 126  Folate Latest Ref Range: >5.9 ng/mL 14.5  Vitamin B12 Latest Ref Range: 180 - 914 pg/mL 1,071 (H)   Scheduled Meds: . buPROPion  150 mg Oral Daily  . doxazosin  2 mg Oral QHS  . feeding supplement (NEPRO CARB STEADY)  237 mL Oral BID BM  . finasteride  5 mg Oral QHS  . insulin aspart  0-9 Units Subcutaneous Q4H  . metoprolol succinate  12.5 mg Oral BID WC  . multivitamin  1 tablet Oral QHS  . pantoprazole  40 mg Oral Daily  . simvastatin  20 mg Oral QPM  . traMADol  50 mg Oral BID   Continuous Infusions: . ferric gluconate (FERRLECIT/NULECIT) IV Stopped (05/06/17 1433)   PRN Meds:.acetaminophen **OR** acetaminophen, albuterol, HYDROcodone-acetaminophen, ondansetron **OR** ondansetron (ZOFRAN) IV   ASSESMENT:   *  Melenic, FOBT + stools.  Seems to have abated for now, though previous pattern may have been that of  intermittent occurrence.    *  Chronic Coumadin for A fib, supratherapeutic INR.    *  Chronic anemia.  Anemia provile c/w anemia of chronic dz.   On oral iron chronically.  Received Ferrlecit 3/12 but none PTA.  Receives Mircera bi-weekly at HD.   Hgb drop 1.2 grams in last 2 days but stable c/w 2 weeks ago.   Renal asked that po iron stop as he received the  IV Fe.    *  ESRD, started MWF HD end of 03/2017.    *  Chronic thrombocytopenia.     *  A fib.     PLAN   *  EGD once INR </=to 1.8.  Earliest likely date is 3/14.  Continue carb mod diet.  CBC, coags in AM.     [name]  05/07/2017, 8:39 AM Pager: [phone]  GI ATTENDING  Interval history data reviewed. Agree with interval progress note as outlined. Patient was off to dialysis. Hemoglobin stable. No further GI bleeding. No bowel movements. INR 2.5. Upper endoscopy possibly tomorrow on his nondialysis today.  [name], M.D. [facility] Division  of Gastroenterology

## 2017-05-07 NOTE — Progress Notes (Signed)
Left upper extremity AVG site staples removed per order, scant amount of bleeding noted.. Pt tolerated. Elita Boone, BSN, RN

## 2017-05-08 ENCOUNTER — Encounter (HOSPITAL_COMMUNITY)
Admission: EM | Disposition: A | Discharge status: Skilled Nursing Facility | Payer: Self-pay | Source: Home / Self Care | Attending: Internal Medicine

## 2017-05-08 ENCOUNTER — Inpatient Hospital Stay (HOSPITAL_COMMUNITY): Payer: PPO | Admitting: Certified Registered Nurse Anesthetist

## 2017-05-08 ENCOUNTER — Encounter (HOSPITAL_COMMUNITY): Payer: Self-pay | Admitting: *Deleted

## 2017-05-08 DIAGNOSIS — E118 Type 2 diabetes mellitus with unspecified complications: Secondary | ICD-10-CM

## 2017-05-08 DIAGNOSIS — N186 End stage renal disease: Secondary | ICD-10-CM

## 2017-05-08 HISTORY — PX: ESOPHAGOGASTRODUODENOSCOPY: SHX5428

## 2017-05-08 LAB — GLUCOSE, CAPILLARY
GLUCOSE-CAPILLARY: 58 mg/dL — AB (ref 65–99)
GLUCOSE-CAPILLARY: 80 mg/dL (ref 65–99)
GLUCOSE-CAPILLARY: 211 mg/dL — AB (ref 65–99)
Glucose-Capillary: 109 mg/dL — ABNORMAL HIGH (ref 65–99)
Glucose-Capillary: 160 mg/dL — ABNORMAL HIGH (ref 65–99)
Glucose-Capillary: 84 mg/dL (ref 65–99)
Glucose-Capillary: 89 mg/dL (ref 65–99)

## 2017-05-08 LAB — BASIC METABOLIC PANEL
ANION GAP: 10 (ref 5–15)
BUN: 20 mg/dL (ref 6–20)
CALCIUM: 7.9 mg/dL — AB (ref 8.9–10.3)
CO2: 26 mmol/L (ref 22–32)
Chloride: 98 mmol/L — ABNORMAL LOW (ref 101–111)
Creatinine, Ser: 4.44 mg/dL — ABNORMAL HIGH (ref 0.61–1.24)
GFR calc Af Amer: 12 mL/min — ABNORMAL LOW (ref >60)
GFR, EST NON AFRICAN AMERICAN: 10 mL/min — AB (ref >60)
GLUCOSE: 60 mg/dL — AB (ref 65–99)
Potassium: 3.9 mmol/L (ref 3.5–5.1)
Sodium: 134 mmol/L — ABNORMAL LOW (ref 135–145)

## 2017-05-08 LAB — PROTIME-INR
INR: 1.73
PROTHROMBIN TIME: 20.1 s — AB (ref 11.4–15.2)

## 2017-05-08 LAB — CBC
HCT: 28.9 % — ABNORMAL LOW (ref 39.0–52.0)
HEMOGLOBIN: 9.1 g/dL — AB (ref 13.0–17.0)
MCH: 30 pg (ref 26.0–34.0)
MCHC: 31.5 g/dL (ref 30.0–36.0)
MCV: 95.4 fL (ref 78.0–100.0)
PLATELETS: 122 10*3/uL — AB (ref 150–400)
RBC: 3.03 MIL/uL — AB (ref 4.22–5.81)
RDW: 18.5 % — ABNORMAL HIGH (ref 11.5–15.5)
WBC: 6 10*3/uL (ref 4.0–10.5)

## 2017-05-08 LAB — MAGNESIUM: Magnesium: 2 mg/dL (ref 1.7–2.4)

## 2017-05-08 SURGERY — EGD (ESOPHAGOGASTRODUODENOSCOPY)
Anesthesia: Monitor Anesthesia Care

## 2017-05-08 MED ORDER — PROPOFOL 500 MG/50ML IV EMUL
INTRAVENOUS | Status: DC | PRN
  Administered 2017-05-08: 50 ug/kg/min via INTRAVENOUS

## 2017-05-08 MED ORDER — SODIUM CHLORIDE 0.9 % IV SOLN
INTRAVENOUS | Status: DC | PRN
  Administered 2017-05-08: 14:00:00 via INTRAVENOUS

## 2017-05-08 MED ORDER — BUTAMBEN-TETRACAINE-BENZOCAINE 2-2-14 % EX AERO
INHALATION_SPRAY | CUTANEOUS | Status: DC | PRN
  Administered 2017-05-08: 2 via TOPICAL

## 2017-05-08 MED ORDER — PROPOFOL 10 MG/ML IV BOLUS
INTRAVENOUS | Status: DC | PRN
  Administered 2017-05-08: 20 mg via INTRAVENOUS

## 2017-05-08 MED ORDER — PHENYLEPHRINE 40 MCG/ML (10ML) SYRINGE FOR IV PUSH (FOR BLOOD PRESSURE SUPPORT)
PREFILLED_SYRINGE | INTRAVENOUS | Status: DC | PRN
  Administered 2017-05-08 (×5): 80 ug via INTRAVENOUS

## 2017-05-08 NOTE — Progress Notes (Signed)
No charge note:   Thank you for this consult:   Reviewed patient's chart. Seen last by Palliative NP Vinie Sill on 9/77 at which time patient and family had decided on DNR and transition to Hospice, however, after consult with Dr. Melvia Heaps, decision was rescinded and they decided to start hemodialysis and patient was discharged to [facility] on 3/4. Now readmitted on 3/11 with melena with planned EGD for 1400 today. Palliative medicine consulted for "goals of care".    Spoke with patient's son, [name]. Plan to meet with family again tomorrow for full consult.  Mariana Kaufman, AGNP-C Palliative Medicine  Please call Palliative Medicine team phone with any questions [phone]. For individual providers please see AMION.

## 2017-05-08 NOTE — Progress Notes (Signed)
Discussed with patient's son-in-law in length, Shanon Brow (Riggins husband), patient's plan of care and answered questions.  Shanon Brow stated patient is eager to be discharged as he would rather complete HD outside the hospital.  I spoke with him about the Palliative meeting set for tomorrow, and Shanon Brow expressed some concerns how the meeting went last time the patient was admitted.  Shanon Brow asked if it was possible for a MD to be present along with palliative team member during meeting to be sure all medical information was adequately being shared. Continue to monitor patient.

## 2017-05-08 NOTE — Progress Notes (Signed)
PT Cancellation Note  Patient Details Name: Ronald [name] MRN: [id number] DOB: 1922/10/10   Cancelled Treatment:    Reason Eval/Treat Not Completed: Patient at procedure or test/unavailable   Matson Welch B Swade Shonka 05/08/2017, 1:22 PM Elwyn Reach, [place]

## 2017-05-08 NOTE — Progress Notes (Signed)
Admit: 05/05/2017 LOS: 3  Subjective:  HD yesterday, 2L UF, Pos weight 68.7  No new c/om this AM other than feels 'stuck in bed' Palliative care eva ltoday  03/13 0701 - 03/14 0700 In: 710 [P.O.:600; IV Piggyback:110] Out: 2000   Filed Weights   05/06/17 0640 05/07/17 0721 05/07/17 1131  Weight: 68.6 kg (151 lb 3.8 oz) 71.2 kg (156 lb 15.5 oz) 68.7 kg (151 lb 7.3 oz)    Scheduled Meds: . buPROPion  150 mg Oral Daily  . doxazosin  2 mg Oral QHS  . feeding supplement (NEPRO CARB STEADY)  237 mL Oral BID BM  . finasteride  5 mg Oral QHS  . insulin aspart  0-9 Units Subcutaneous Q4H  . metoprolol succinate  12.5 mg Oral BID WC  . multivitamin  1 tablet Oral QHS  . pantoprazole  40 mg Oral Daily  . simvastatin  20 mg Oral QPM  . traMADol  50 mg Oral BID   Continuous Infusions: . ferric gluconate (FERRLECIT/NULECIT) IV Stopped (05/07/17 1535)   PRN Meds:.acetaminophen **OR** acetaminophen, albuterol, HYDROcodone-acetaminophen, ondansetron **OR** ondansetron (ZOFRAN) IV  Current Labs: reviewed    Physical Exam:  Blood pressure 136/68, pulse 89, temperature 97.8 F (36.6 C), temperature source Oral, resp. rate 17, height 5\' 11"  (1.803 m), weight 68.7 kg (151 lb 7.3 oz), SpO2 96 %. NAD IRIR, nl s1s2 Nonfocal HOH, NCAD CTAB TDC L IJG, LUE AVG  Dialysis Orders: [facility] MWF 4 hr 2K 2.5 Ca 400/800 EDW 69.5 no heparin venofer 50/week Mircera 100 q 2 wks last 3/6 left IJ TDC and left upper AVGG placed 2/28 Recent labs: hgb 9.3 3/4 3.1 3/6 28% sat 3/4 iPTH 102  A 1. GIB, for EGD with INR improved 2. ESRD MWF on schedule 3. HTN/VOl: probe down EDW as able 4. Anemia: stable today.  Fe/ESA 5. Hx/o AVF 6. AFib prev on warfarin, held currently 7. Tobacco user  P 1. EGD today? 2. HD tomorrow: 4h, 3K, 1-2L UF BP permitting, no heparin, TDC   Pearson Grippe MD 05/08/2017, 9:47 AM  Recent Labs  Lab 05/06/17 0224 05/07/17 0738 05/08/17 0419  NA 135 133* 134*  K 4.3 4.8 3.9  CL  98* 98* 98*  CO2 23 23 26   GLUCOSE 149* 105* 60*  BUN 43* 52* 20  CREATININE 7.04* 7.94* 4.44*  CALCIUM 8.8* 8.5* 7.9*  PHOS 4.7* 4.2  --    Recent Labs  Lab 05/06/17 0224 05/07/17 0418 05/08/17 0419  WBC 10.5 7.9 6.0  HGB 9.2* 8.8* 9.1*  HCT 28.4* 27.0* 28.9*  MCV 95.0 95.4 95.4  PLT 164 138* 122*

## 2017-05-08 NOTE — Progress Notes (Signed)
[name], male   DOB: [birth year], 84 y.o.   MRN: [id number]  PROGRESS NOTE    Ronald [name]  VQX:[id number] DOB: April 26, 1922 DOA: 05/05/2017 PCP: [name], MD   Brief Narrative:  84 year old male with history of atrial fibrillation on Coumadin, end-stage renal disease on hemodialysis, chronic diastolic CHF, bioprosthetic AVR, COPD, hypertension, diabetes mellitus type 2 and GI bleed presented with melena.  GI was consulted.  Assessment & Plan:   Active Problems:   Diabetes mellitus with complication (HCC)   Hyperlipidemia   Essential hypertension   PVD, Rt SFA PTA/HSRA 06/17/11   S/P aortic valve replacement: #23 Magna Ease Edwards Pericardial Valve  November 2012   COPD (chronic obstructive pulmonary disease) (HCC)   Tobacco use   GI bleed   ESRD (end stage renal disease) ([place])   Coagulopathy (HCC)   Probable upper GI bleeding presenting with melena -GI following.  Plan for EGD today.  INR is 1.73 today.  -Continue Protonix. -Hemoglobin stable at 9.1 today  Chronic atrial fibrillation: per cardiology ok to hold coumadin and proceed with GI work up.  CHADS-VASc score is 4.  OK to resume coumadin if GI w/u negative, pt not fall risk at this time.   -Currently rate controlled.  Continue metoprolol  ESRD - on HD since mid February.  Nephrology following.  Dialysis as per nephrology schedule  HTN essential -continue metoprolol; monitor blood pressure.  Currently stable  COPD: stable; continue home meds  Tobacco abuse: Patient not interested in cessation  Generalized deconditioning -Palliative care evaluation pending.    DVT prophylaxis: SCDs Code Status: Full Family Communication: None at bedside Disposition Plan: Nursing home once cleared by GI  Consultants: GI/cardiology/nephrology  Procedures: None  Antimicrobials: None   Subjective: Patient seen and examined at bedside.  He is hard of hearing.  No new complaints.  No overnight fever,  nausea or vomiting. Objective: Vitals:   05/08/17 0400 05/08/17 0426 05/08/17 0745 05/08/17 0800  BP: (!) 118/55  131/67 136/68  Pulse: 79  86 89  Resp: 19  18 17   Temp:  97.6 F (36.4 C) 97.8 F (36.6 C)   TempSrc:  Oral Oral   SpO2: 98%  97% 96%  Weight:      Height:        Intake/Output Summary (Last 24 hours) at 05/08/2017 1034 Last data filed at 05/08/2017 0400 Gross per 24 hour  Intake 710 ml  Output 2000 ml  Net -1290 ml   Filed Weights   05/06/17 0640 05/07/17 0721 05/07/17 1131  Weight: 68.6 kg (151 lb 3.8 oz) 71.2 kg (156 lb 15.5 oz) 68.7 kg (151 lb 7.3 oz)    Examination:  General exam: Elderly male lying in bed, awake.  Very hard of hearing.  No acute distress Respiratory system: Bilateral decreased breath sounds at bases cardiovascular system: S1-S2 heard, rate controlled gastrointestinal system: Abdomen is nondistended, soft and nontender. Normal bowel sounds heard. Extremities: No cyanosis, clubbing, edema    Data Reviewed: I have personally reviewed following labs and imaging studies  CBC: Recent Labs  Lab 05/05/17 1400 05/06/17 0224 05/07/17 0418 05/08/17 0419  WBC 9.4 10.5 7.9 6.0  HGB 10.0* 9.2* 8.8* 9.1*  HCT 31.5* 28.4* 27.0* 28.9*  MCV 96.9 95.0 95.4 95.4  PLT 195 164 138* 003*   Basic Metabolic Panel: Recent Labs  Lab 05/05/17 1400 05/06/17 0224 05/07/17 0738 05/08/17 0419  NA 133* 135 133* 134*  K 4.5 4.3 4.8 3.9  CL 95* 98* 98* 98*  CO2 25 23 23 26   GLUCOSE 237* 149* 105* 60*  BUN 36* 43* 52* 20  CREATININE 6.47* 7.04* 7.94* 4.44*  CALCIUM 8.9 8.8* 8.5* 7.9*  MG  --  2.3  --  2.0  PHOS  --  4.7* 4.2  --    GFR: Estimated Creatinine Clearance: 10.1 mL/min (A) (by C-G formula based on SCr of 4.44 mg/dL (H)). Liver Function Tests: Recent Labs  Lab 05/05/17 1400 05/06/17 0224 05/07/17 0738  AST 23 18  --   ALT 13* 11*  --   ALKPHOS 86 74  --   BILITOT 0.5 0.7  --   PROT 7.0 6.4*  --   ALBUMIN 2.9* 2.7* 2.4*   No  results for input(s): LIPASE, AMYLASE in the last 168 hours. No results for input(s): AMMONIA in the last 168 hours. Coagulation Profile: Recent Labs  Lab 05/05/17 1400 05/07/17 0418 05/08/17 0419  INR 3.22 2.45 1.73   Cardiac Enzymes: No results for input(s): CKTOTAL, CKMB, CKMBINDEX, TROPONINI in the last 168 hours. BNP (last 3 results) No results for input(s): PROBNP in the last 8760 hours. HbA1C: Recent Labs    05/05/17 1400  HGBA1C 4.8   CBG: Recent Labs  Lab 05/07/17 2012 05/08/17 0005 05/08/17 0432 05/08/17 0505 05/08/17 0745  GLUCAP 111* 160* 58* 89 84   Lipid Profile: No results for input(s): CHOL, HDL, LDLCALC, TRIG, CHOLHDL, LDLDIRECT in the last 72 hours. Thyroid Function Tests: Recent Labs    05/06/17 0224  TSH 1.096   Anemia Panel: Recent Labs    05/06/17 0224  VITAMINB12 1,071*  FOLATE 14.5  FERRITIN 126  TIBC 125*  IRON 23*  RETICCTPCT 2.9   Sepsis Labs: No results for input(s): PROCALCITON, LATICACIDVEN in the last 168 hours.  Recent Results (from the past 240 hour(s))  MRSA PCR Screening     Status: None   Collection Time: 05/06/17  6:55 AM  Result Value Ref Range Status   MRSA by PCR NEGATIVE NEGATIVE Final    Comment:        The GeneXpert MRSA Assay (FDA approved for NASAL specimens only), is one component of a comprehensive MRSA colonization surveillance program. It is not intended to diagnose MRSA infection nor to guide or monitor treatment for MRSA infections. Performed at [facility], [place] [address], [place], [place] [address]          Radiology Studies: No results found.      Scheduled Meds: . buPROPion  150 mg Oral Daily  . doxazosin  2 mg Oral QHS  . feeding supplement (NEPRO CARB STEADY)  237 mL Oral BID BM  . finasteride  5 mg Oral QHS  . insulin aspart  0-9 Units Subcutaneous Q4H  . metoprolol succinate  12.5 mg Oral BID WC  . multivitamin  1 tablet Oral QHS  . pantoprazole  40 mg Oral Daily   . simvastatin  20 mg Oral QPM  . traMADol  50 mg Oral BID   Continuous Infusions: . ferric gluconate (FERRLECIT/NULECIT) IV Stopped (05/07/17 1535)     LOS: 3 days        Aline August, MD [facility] Pager [phone]  If 7PM-7AM, please contact night-coverage [email or web address] Password [facility] 05/08/2017, 10:34 AM

## 2017-05-08 NOTE — Anesthesia Procedure Notes (Signed)
Procedure Name: MAC Performed by: Carina Chaplin B, CRNA Pre-anesthesia Checklist: Patient identified, Emergency Drugs available, Suction available, Patient being monitored and Timeout performed Patient Re-evaluated:Patient Re-evaluated prior to induction Oxygen Delivery Method: Nasal cannula Preoxygenation: Pre-oxygenation with 100% oxygen Induction Type: IV induction Airway Equipment and Method: Bite block Placement Confirmation: positive ETCO2 Dental Injury: Teeth and Oropharynx as per pre-operative assessment        

## 2017-05-08 NOTE — Op Note (Signed)
[facility] Patient Name: Ronald [name] Procedure Date : 05/08/2017 MRN: [id number] Attending MD: Docia Chuck. Henrene Pastor , MD Date of Birth: Sep 29, 1922 CSN: [id number] Age: 84 Admit Type: Inpatient Procedure:                Upper GI endoscopy Indications:              Heme positive stool, Melena Providers:                Docia Chuck. Henrene Pastor, MD, Carmie End, RN, Alan Mulder, Technician, Edmonia James, CRNA Referring MD:             [facility] Medicines:                Monitored Anesthesia Care Complications:            No immediate complications. Estimated Blood Loss:     Estimated blood loss: none. Procedure:                Pre-Anesthesia Assessment:                           - Prior to the procedure, a History and Physical                            was performed, and patient medications and                            allergies were reviewed. The patient's tolerance of                            previous anesthesia was also reviewed. The risks                            and benefits of the procedure and the sedation                            options and risks were discussed with the patient.                            All questions were answered, and informed consent                            was obtained. Prior Anticoagulants: The patient has                            taken Coumadin (warfarin), last dose was 3 days                            prior to procedure. ASA Grade Assessment: III - A                            patient with severe systemic disease. After  reviewing the risks and benefits, the patient was                            deemed in satisfactory condition to undergo the                            procedure.                           After obtaining informed consent, the endoscope was                            passed under direct vision. Throughout the                            procedure, the  patient's blood pressure, pulse, and                            oxygen saturations were monitored continuously. The                            [id number] ([id number]) scope was introduced through the                            mouth, and advanced to the second part of duodenum.                            The upper GI endoscopy was accomplished without                            difficulty. The patient tolerated the procedure                            well. Scope In: Scope Out: Findings:      The esophagus was normal.      The stomach was normal.      The examined duodenum was normal.      The cardia and gastric fundus were normal on retroflexion. Impression:               1. Normal EGD                           2. Suspect that transient dark Hemoccult-positive                            stool was secondary to swallowed blood from                            reported epistaxis. Moderate Sedation:      none Recommendation:           1. Recommend daily PPI to protect upper GI mucosal                            in an elderly patient with multiple comorbidities  on chronic anticoagulation therapy                           2. Keep INR in the therapeutic range                           3. If problems with epistaxis recur and are                            significant, then ENT evaluation would be warranted                           4. Resume previous diet                           5. No further plans from GI perspective. Will sign                            off Procedure Code(s):        --- Professional ---                           [phone], Esophagogastroduodenoscopy, flexible,                            transoral; diagnostic, including collection of                            specimen(s) by brushing or washing, when performed                            (separate procedure) Diagnosis Code(s):        --- Professional ---                           R19.5, Other fecal  abnormalities                           K92.1, Melena (includes Hematochezia) CPT copyright 2016 American Medical Association. All rights reserved. The codes documented in this report are preliminary and upon coder review may  be revised to meet current compliance requirements. Docia Chuck. Henrene Pastor, MD 05/08/2017 2:15:42 PM This report has been signed electronically. Number of Addenda: 0

## 2017-05-08 NOTE — Progress Notes (Signed)
Pt returned from Endo, brother at bedside. Patient alert, vitals stable. Continue to monitor.

## 2017-05-08 NOTE — Progress Notes (Signed)
Hypoglycemic Event  CBG: 58 Time: 0432  Treatment: 1 can coke  Symptoms: CBG 58, no other symptoms  Follow-up CBG: Time:0505 CBG Result:89  Possible Reasons for Event: no known reasons, received 2 units of insulin for CBG 160    Livio Ledwith R Iker Nuttall

## 2017-05-08 NOTE — Anesthesia Preprocedure Evaluation (Signed)
Anesthesia Evaluation  Patient identified by MRN, date of birth, ID band Patient awake    Reviewed: Allergy & Precautions, NPO status , Patient's Chart, lab work & pertinent test results, reviewed documented beta blocker date and time   History of Anesthesia Complications Negative for: history of anesthetic complications  Airway Mallampati: III  TM Distance: >3 FB Neck ROM: Full    Dental  (+) Teeth Intact   Pulmonary shortness of breath, COPD,  COPD inhaler, neg recent URI, Current Smoker,    breath sounds clear to auscultation       Cardiovascular hypertension, Pt. on medications and Pt. on home beta blockers + Peripheral Vascular Disease and +CHF  + dysrhythmias Atrial Fibrillation  Rhythm:Irregular     Neuro/Psych PSYCHIATRIC DISORDERS Depression negative neurological ROS     GI/Hepatic ? GI bleed   Endo/Other  diabetes, Type 2  Renal/GU ESRF and DialysisRenal diseaseHD Wed     Musculoskeletal   Abdominal   Peds  Hematology  (+) anemia ,   Anesthesia Other Findings  Diabetes mellitus with complication ([place])   Hyperlipidemia   Essential hypertension   PVD, Rt SFA PTA/HSRA 06/17/11   S/P aortic valve replacement: #23 Magna Ease Edwards Pericardial Valve  November 2012   COPD (chronic obstructive pulmonary disease) (HCC)   Tobacco use   GI bleed   ESRD (end stage renal disease) (HCC)   Coagulopathy (HCC)     Reproductive/Obstetrics                             Anesthesia Physical Anesthesia Plan  ASA: III  Anesthesia Plan: MAC   Post-op Pain Management:    Induction:   PONV Risk Score and Plan: 0 and Treatment may vary due to age or medical condition  Airway Management Planned: Nasal Cannula  Additional Equipment:   Intra-op Plan:   Post-operative Plan:   Informed Consent: I have reviewed the patients History and Physical, chart, labs and discussed the procedure  including the risks, benefits and alternatives for the proposed anesthesia with the patient or authorized representative who has indicated his/her understanding and acceptance.   Dental advisory given  Plan Discussed with: CRNA and Surgeon  Anesthesia Plan Comments:         Anesthesia Quick Evaluation

## 2017-05-08 NOTE — Progress Notes (Signed)
Inpatient Diabetes Program Recommendations  AACE/ADA: New Consensus Statement on Inpatient Glycemic Control (2015)  Target Ranges:  Prepandial:   less than 140 mg/dL      Peak postprandial:   less than 180 mg/dL (1-2 hours)      Critically ill patients:  140 - 180 mg/dL   Lab Results  Component Value Date   GLUCAP 84 05/08/2017   HGBA1C 4.8 05/05/2017    Review of Glycemic Control Results for Ronald [name], Ronald [name] (MRN [id number]) as of 05/08/2017 10:19  Ref. Range 05/07/2017 20:12 05/08/2017 00:05 05/08/2017 04:32 05/08/2017 05:05 05/08/2017 07:45  Glucose-Capillary Latest Ref Range: 65 - 99 mg/dL 111 (H) 160 (H) 58 (L) 89 84   Diabetes history: DM2 Outpatient Diabetes medications: Glucotrol 5 mg qd Current orders for Inpatient glycemic control: Novolog sensitive correction tid  Inpatient Diabetes Program Recommendations:   Noted current A1c is 4.8.  -D/C Novolog correction scale  -D/C Glucotrol on D/C home  Thank you, Bethena Roys E. Kashonda Sarkisyan, RN, MSN, CDE  Diabetes Coordinator Inpatient Glycemic Control Team Team Pager [phone] (8am-5pm) 05/08/2017 10:21 AM

## 2017-05-08 NOTE — Consult Note (Signed)
   [facility] CM Inpatient Consult   05/08/2017  Ronald [name] [facility] Apr 23, 1922 [id number]    [name] is active with [place] Management program. Please see chart review tab then encounters for patient outreach details.  Went to bedside on yesterday 05/07/17 to speak with [name] and son briefly. [name] is extremely hard of hearing in the left ear.  Made inpatient RNCM Almyra Free) aware on 05/07/17 that [name] is followed by [place] [facility].  Marthenia Rolling, MSN-Ed, RN,BSN [facility] Liaison [phone]

## 2017-05-08 NOTE — Transfer of Care (Signed)
Immediate Anesthesia Transfer of Care Note  Patient: Ronald [name] [facility]  Procedure(s) Performed: ESOPHAGOGASTRODUODENOSCOPY (EGD) (N/A )  Patient Location: Endoscopy Unit  Anesthesia Type:MAC  Level of Consciousness: sedated  Airway & Oxygen Therapy: Patient Spontanous Breathing and Patient connected to nasal cannula oxygen  Post-op Assessment: Report given to RN and Post -op Vital signs reviewed and stable  Post vital signs: Reviewed and stable  Last Vitals:  Vitals:   05/08/17 1418 05/08/17 1423  BP: (!) 84/20 (!) 111/29  Pulse: 70 66  Resp: 18 18  Temp: 36.4 C   SpO2: 100% 100%    Last Pain:  Vitals:   05/08/17 1418  TempSrc: Oral  PainSc:       Patients Stated Pain Goal: 3 (22/33/61 2244)  Complications: No apparent anesthesia complications

## 2017-05-08 NOTE — Social Work (Signed)
CSW aware that PMT is meeting for [place] with family tomorrow.   CSW continuing to follow.   [place], [place] Work [phone]

## 2017-05-08 NOTE — Clinical Social Work Note (Signed)
Clinical Social Work Assessment  Patient Details  Name: Ronald [name] MRN: [id number] Date of Birth: 10-01-1922  Date of referral:  05/08/17               Reason for consult:  Facility Placement, Discharge Planning                Permission sought to share information with:  Facility Sport and exercise psychologist, Family Supports Permission granted to share information::  (pt has fluctuating orientation and extremely hard of hearing)  Name::     Landscape architect  Agency::  [employer]  Relationship::  daughter  Sport and exercise psychologist Information:     Housing/Transportation Living arrangements for the past 2 months:  [place] of Information:  Adult Children Patient Interpreter Needed:  None Criminal Activity/Legal Involvement Pertinent to Current Situation/Hospitalization:  No - Comment as needed Significant Relationships:  Other Family Members, Siblings, Adult Children Lives with:  Facility Resident Do you feel safe going back to the place where you live?  Yes Need for family participation in patient care:  Yes (Comment)(decision making/mobility/transportation to dialysis)  Care giving concerns:  Pt has had frequent hospital admissions in the last 6 months. Pt has been living at [place]. Pt is on dialysis and requires some assistance for ambulation/IADLs/ADLs. PT is recommending SNF, however pt is walking same # of feet as previous admission before he went to ALF.    Social Worker assessment / plan:  CSW spoke with pt daughter Ronald [name] who is the point of contact that [facility] has on file. Pt daughter states that her dad has been at [facility] since December and before then was living independently in his home. Pt daughter states that she recognizes that her dad requires more assistance and asked questions about "home dialysis." CSW encouraged daughter to ask about that during her [place] meeting with palliative and also explained that her father was on hemodialysis  and CSW was unaware of any home dialysis options.   Pt daughter stated no further concerns but would prefer that pt return back to [facility] as she feels like pt is happier there. CSW will continue to follow to support discharge when medically appropriate. Pt daughter Ronald [name] states that her siblings Ronald [name], Ronald [name], and Ronald [name]) support pt also and are involved in care.   Employment status:  Retired Advertising copywriter PT Recommendations:  [place], [place] / Referral to community resources:  [place]  Patient/Family's Response to care:  Pt daughter expressed gratitude for CSW help and is hopeful pt can return to [employer] [facility] instead of SNF.   Patient/Family's Understanding of and Emotional Response to Diagnosis, Current Treatment, and Prognosis:  Pt daughter Ronald [name] states understanding of diagnosis, current treatment and prognosis. It does make her sad that pt was formerly independent before December and to see his independence slowly go away.   Emotional Assessment Appearance:  Appears stated age Attitude/Demeanor/Rapport:  Unable to Assess Affect (typically observed):  Unable to Assess Orientation:  Fluctuating Orientation (Suspected and/or reported Sundowners), Oriented to Self, Oriented to Place, Oriented to Situation Alcohol / Substance use:  Tobacco Use(smoker) Psych involvement (Current and /or in the community):  No (Comment)  Discharge Needs  Concerns to be addressed:  Discharge Planning Concerns, Care Coordination Readmission within the last 30 days:  Yes Current discharge risk:  Cognitively Impaired, Physical Impairment, Dependent with Mobility Barriers to Discharge:  Continued Medical Work up, Ship broker  [place] 05/08/2017, 3:45 PM

## 2017-05-08 NOTE — Anesthesia Postprocedure Evaluation (Signed)
Anesthesia Post Note  Patient: Ronald [name]  Procedure(s) Performed: ESOPHAGOGASTRODUODENOSCOPY (EGD) (N/A )     Patient location during evaluation: Endoscopy Anesthesia Type: MAC Level of consciousness: awake and alert Pain management: pain level controlled Vital Signs Assessment: post-procedure vital signs reviewed and stable Respiratory status: spontaneous breathing, nonlabored ventilation, respiratory function stable and patient connected to nasal cannula oxygen Cardiovascular status: stable and blood pressure returned to baseline Postop Assessment: no apparent nausea or vomiting Anesthetic complications: no    Last Vitals:  Vitals:   05/08/17 1433 05/08/17 1600  BP: (!) 133/40 138/72  Pulse: 66 75  Resp: 17 17  Temp:  36.4 C  SpO2: 100% 96%    Last Pain:  Vitals:   05/08/17 1600  TempSrc: Oral  PainSc: 0-No pain                 Tammara Massing

## 2017-05-08 NOTE — Interval H&P Note (Signed)
History and Physical Interval Note:  05/08/2017 1:55 PM  Ronald [name]  has presented today for surgery, with the diagnosis of Dark, heme positive stool.  Acute on chronic anemia.  The various methods of treatment have been discussed with the patient and family. After consideration of risks, benefits and other options for treatment, the patient has consented to  Procedure(s): ESOPHAGOGASTRODUODENOSCOPY (EGD) (N/A) as a surgical intervention .  The patient's history has been reviewed, patient examined, no change in status, stable for surgery.  I have reviewed the patient's chart and labs.  Questions were answered to the patient's satisfaction.     [name]

## 2017-05-09 ENCOUNTER — Telehealth: Payer: Self-pay | Admitting: Internal Medicine

## 2017-05-09 DIAGNOSIS — L8990 Pressure ulcer of unspecified site, unspecified stage: Secondary | ICD-10-CM

## 2017-05-09 DIAGNOSIS — Z7189 Other specified counseling: Secondary | ICD-10-CM

## 2017-05-09 DIAGNOSIS — K921 Melena: Secondary | ICD-10-CM

## 2017-05-09 LAB — BASIC METABOLIC PANEL
Anion gap: 13 (ref 5–15)
BUN: 30 mg/dL — AB (ref 6–20)
CALCIUM: 8.3 mg/dL — AB (ref 8.9–10.3)
CO2: 23 mmol/L (ref 22–32)
CREATININE: 5.41 mg/dL — AB (ref 0.61–1.24)
Chloride: 96 mmol/L — ABNORMAL LOW (ref 101–111)
GFR calc Af Amer: 9 mL/min — ABNORMAL LOW (ref >60)
GFR, EST NON AFRICAN AMERICAN: 8 mL/min — AB (ref >60)
GLUCOSE: 102 mg/dL — AB (ref 65–99)
POTASSIUM: 4.5 mmol/L (ref 3.5–5.1)
SODIUM: 132 mmol/L — AB (ref 135–145)

## 2017-05-09 LAB — GLUCOSE, CAPILLARY
GLUCOSE-CAPILLARY: 167 mg/dL — AB (ref 65–99)
GLUCOSE-CAPILLARY: 78 mg/dL (ref 65–99)
GLUCOSE-CAPILLARY: 91 mg/dL (ref 65–99)
GLUCOSE-CAPILLARY: 96 mg/dL (ref 65–99)
Glucose-Capillary: 148 mg/dL — ABNORMAL HIGH (ref 65–99)
Glucose-Capillary: 151 mg/dL — ABNORMAL HIGH (ref 65–99)
Glucose-Capillary: 96 mg/dL (ref 65–99)

## 2017-05-09 LAB — CBC
HEMATOCRIT: 27.3 % — AB (ref 39.0–52.0)
Hemoglobin: 8.8 g/dL — ABNORMAL LOW (ref 13.0–17.0)
MCH: 30.9 pg (ref 26.0–34.0)
MCHC: 32.2 g/dL (ref 30.0–36.0)
MCV: 95.8 fL (ref 78.0–100.0)
PLATELETS: 109 10*3/uL — AB (ref 150–400)
RBC: 2.85 MIL/uL — ABNORMAL LOW (ref 4.22–5.81)
RDW: 18.4 % — AB (ref 11.5–15.5)
WBC: 6.3 10*3/uL (ref 4.0–10.5)

## 2017-05-09 LAB — MAGNESIUM: MAGNESIUM: 2.1 mg/dL (ref 1.7–2.4)

## 2017-05-09 LAB — PROTIME-INR
INR: 1.51
PROTHROMBIN TIME: 18.1 s — AB (ref 11.4–15.2)

## 2017-05-09 MED ORDER — TRAMADOL HCL 50 MG PO TABS: 50.0000 mg | ORAL_TABLET | Freq: Two times a day (BID) | ORAL | 0 refills | Status: DC | PRN

## 2017-05-09 MED ORDER — PANTOPRAZOLE SODIUM 40 MG PO TBEC: 40.0000 mg | DELAYED_RELEASE_TABLET | Freq: Every day | ORAL | 0 refills | Status: AC

## 2017-05-09 MED ORDER — WARFARIN - PHARMACIST DOSING INPATIENT
Freq: Every day | Status: DC
  Administered 2017-05-09: 18:00:00

## 2017-05-09 MED ORDER — WARFARIN SODIUM 2 MG PO TABS
2.0000 mg | ORAL_TABLET | Freq: Once | ORAL | Status: AC
  Administered 2017-05-09: 2 mg via ORAL
  Filled 2017-05-09: qty 1

## 2017-05-09 NOTE — Progress Notes (Signed)
Pt refusing HD this morning. Educated pt on the importance of going but pt still refused. Pt stated he waited 2 hours last time for dialysis and said he is not doing that again. Pt also stated he is having breakfast delivered to him this morning at 9am and dialysis takes 5 hours to complete and he is not missing breakfast. Called on-call nephrology MD to let them know of refusal. Will continue to monitor.

## 2017-05-09 NOTE — Care Management Note (Signed)
Case Management Note  Patient Details  Name: BRAEDAN MEUTH MRN: [id number] Date of Birth: 08-24-1922  Subjective/Objective:  Pt admitted on 05/05/17 with melena.  PTA, pt resided at [facility].  [facility] following at [place] for RN, [place], and aide.                  Action/Plan: CSW consulted to facilitate return to ALF, if able, upon medical stability.  Will follow progress.   Expected Discharge Date:  05/09/17               Expected Discharge Plan:  Assisted Living / Rest Home  In-House Referral:  Clinical Social Work  Discharge planning Services  CM Consult  Post Acute Care Choice:  Home Health, Resumption of Svcs/PTA Provider Choice offered to:  Adult Children, HC POA / Guardian  DME Arranged:    DME Agency:     HH Arranged:  RN, PT [place] Agency:  [place] (now [facility])  Status of Service:  Completed, signed off  If discussed at [employer] of Stay Meetings, dates discussed:    Additional Comments:  05/09/17 J. Chamaine Stankus, RN, BSN Pt medically stable for discharge today.  Palliative care meeting held this morning with family; recommending full scope care with OP palliative follow up.  Planning return to [place] today (family to transport) with continuation of [place] services by [facility] as prior to admission.  Notified [facility] of dc today and resumption orders.      Reinaldo Raddle, RN, BSN  Trauma/Neuro ICU Case Manager [phone]

## 2017-05-09 NOTE — Care Management Important Message (Signed)
Important Message  Patient Details  Name: Ronald [name] MRN: [id number] Date of Birth: [birth year]   Medicare Important Message Given:  Yes    Ediberto Sens Montine Circle 05/09/2017, 11:23 AM

## 2017-05-09 NOTE — Discharge Summary (Addendum)
Physician Discharge Summary  Ronald [name]:[id number] DOB: [birth year] DOA: 05/05/2017  PCP: [name], MD  Admit date: 05/05/2017 Discharge date: 05/09/2017  Admitted From: ALF Disposition:  ALF  Recommendations for Outpatient Follow-up:  1. Follow up with PCP in 1  Week with repeat CBC 2. Follow-up with outpatient dialysis as scheduled 3. Follow-up with gastroenterology in 1-2 weeks 4. Follow-up with cardiology in 1-2 weeks 5. Patient will benefit from outpatient evaluation and follow-up by palliative care team  Home Health: Yes Equipment/Devices: None  Discharge Condition: Poor CODE STATUS: Full Diet recommendation: Heart Healthy / Carb Modified /renal hemodialysis  Brief/Interim Summary: 84 year old male with history of atrial fibrillation on Coumadin, end-stage renal disease on hemodialysis, chronic diastolic CHF, bioprosthetic AVR, COPD, hypertension, diabetes mellitus type 2 and GI bleed presented with melena.  GI was consulted.  Patient had upper GI endoscopy on 05/08/2017 which was normal.  GI recommended Protonix 40 mg daily.  Because of recurrent bleeding episodes and supratherapeutic INR, Coumadin will be stopped for now and family is aware.  The patient decides to go back to Coumadin, this can be restarted.  Overall prognosis is guarded to poor.  Palliative care team has evaluated the patient again during this hospitalization as well.  Patient will benefit from outpatient evaluation and follow-up by palliative care team.  Hemoglobin has remained stable.  He will be discharged back to assisted living facility.     Discharge Diagnoses:  Active Problems:   Diabetes mellitus with complication (HCC)   Hyperlipidemia   Essential hypertension   PVD, Rt SFA PTA/HSRA 06/17/11   S/P aortic valve replacement: #23 Magna Ease Edwards Pericardial Valve  November 2012   COPD (chronic obstructive pulmonary disease) (HCC)   Tobacco use   GI bleed   ESRD (end stage renal disease)  ([place])   Coagulopathy ([place])   Melena -Status post EGD on 05/08/2017 which was essentially unremarkable.  GI thought that melena was secondary to reported epistaxis which patient probably swallowed.  Continue Protonix 40 mg daily  -Hemoglobin stable at 8.8 today.  Outpatient follow-up of CBC.  Patient might benefit from follow-up with gastroenterology   Chronic atrial fibrillation: per cardiology ok to hold coumadin and proceed with GI work up. CHADS-VASc score is 4. OK to resume coumadin if GI w/u negative -Currently rate controlled.  Continue metoprolol -After discussion with family members today, Coumadin is being held because of recurrent episodes of bleeding and supratherapeutic INR.  The patient decides to restart Coumadin, this can be done as an outpatient as well  ESRD - on HD since mid February.  Nephrology following.  Tolerating dialysis inpatient.  Continue outpatient dialysis  HTN essential -continue metoprolol  COPD: stable; continue home meds.  Tobacco abuse: Patient not interested in cessation.  Advised to stop smoking  Generalized deconditioning -Palliative care has evaluated the patient.  I had a detailed discussion with multiple family members today along with palliative care team and explained overall guarded to poor prognosis of the patient.  Patient currently remains full code.  Patient will benefit from continued outpatient discussion of patient and family members with the palliative care team.  Stage II coccygeal pressure ulcer present on admission -Outpatient follow-up   Discharge Instructions  Discharge Instructions    Ambulatory referral to Gastroenterology   Complete by:  As directed    Recent GI bleed   What is the reason for referral?:  Other   Call MD for:  difficulty breathing, headache or visual disturbances  Complete by:  As directed    Call MD for:  extreme fatigue   Complete by:  As directed    Call MD for:  hives   Complete by:  As  directed    Call MD for:  persistant dizziness or light-headedness   Complete by:  As directed    Call MD for:  persistant nausea and vomiting   Complete by:  As directed    Call MD for:  severe uncontrolled pain   Complete by:  As directed    Call MD for:  temperature >100.4   Complete by:  As directed    Diet - low sodium heart healthy   Complete by:  As directed    Diet Carb Modified   Complete by:  As directed    Discharge instructions   Complete by:  As directed    Renal hemodialysis diet   Increase activity slowly   Complete by:  As directed      Allergies as of 05/09/2017      Reactions   Aspirin Anaphylaxis, Shortness Of Breath, Rash, Other (See Comments)   "broke out in white welts; red blotches neck and face; windpipe closed; ~ 1962"   Tape Other (See Comments)   Patient's skin is VERY THIN and tears and bruises easily!!      Medication List    STOP taking these medications   glyBURIDE 5 MG tablet Commonly known as:  DIABETA   multivitamin Tabs tablet   warfarin 2 MG tablet Commonly known as:  COUMADIN     TAKE these medications   acetaminophen 325 MG tablet Commonly known as:  TYLENOL Take 650 mg by mouth every 6 (six) hours as needed ("for pain score 1-4; not to exceed #8 tablets in 24 hours").   albuterol 0.63 MG/3ML nebulizer solution Commonly known as:  ACCUNEB Take 3 mLs by nebulization every 6 (six) hours as needed for wheezing or shortness of breath.   ANORO ELLIPTA 62.5-25 MCG/INH Aepb Generic drug:  umeclidinium-vilanterol Inhale 1 puff into the lungs daily.   buPROPion 150 MG 24 hr tablet Commonly known as:  WELLBUTRIN XL Take 150 mg by mouth daily.   diphenhydrAMINE 12.5 MG/5ML elixir Commonly known as:  BENADRYL Take 5 mLs (12.5 mg total) by mouth every 6 (six) hours as needed for itching. Over the counter What changed:  additional instructions   doxazosin 2 MG tablet Commonly known as:  CARDURA Take 1 tablet (2 mg total) by mouth  at bedtime.   ferrous sulfate 325 (65 FE) MG EC tablet Take 1 tablet (325 mg total) by mouth 2 (two) times daily.   finasteride 5 MG tablet Commonly known as:  PROSCAR Take 5 mg by mouth at bedtime.   fluticasone 50 MCG/ACT nasal spray Commonly known as:  FLONASE Place 1 spray into both nostrils 2 (two) times daily.   glipiZIDE 5 MG tablet Commonly known as:  GLUCOTROL Take 5 mg by mouth daily before breakfast.   loratadine 10 MG tablet Commonly known as:  CLARITIN Take 10 mg by mouth daily.   Melatonin 5 MG Tabs Take 5 mg by mouth at bedtime.   metoprolol succinate 25 MG 24 hr tablet Commonly known as:  TOPROL-XL Take 0.5 tablets (12.5 mg total) by mouth 2 (two) times daily with a meal.   multivitamin with minerals tablet Take 1 tablet by mouth at bedtime.   OXYGEN Inhale 2 L into the lungs continuous.   pantoprazole 40 MG tablet  Commonly known as:  PROTONIX Take 1 tablet (40 mg total) by mouth daily. Start taking on:  05/10/2017   simvastatin 20 MG tablet Commonly known as:  ZOCOR Take 20 mg by mouth every evening.   traMADol 50 MG tablet Commonly known as:  ULTRAM Take 1 tablet (50 mg total) by mouth every 12 (twelve) hours as needed for moderate pain. What changed:    when to take this  reasons to take this      Follow-up Information    [name], MD. Schedule an appointment as soon as possible for a visit in 1 week(s).   Specialty:  Internal Medicine Why:  with repeat cbc Contact information: [place] [address] [phone]        outpatient dialysis Follow up.   Why:  as scheduled       Irene Shipper, MD. Schedule an appointment as soon as possible for a visit in 1 week(s).   Specialty:  Gastroenterology Contact information: [address] [place] [address] [phone]        Adrian Prows, MD. Schedule an appointment as soon as possible for a visit in 1 week(s).   Specialty:  Cardiology Contact  information: [address] [place] [address] [phone]          Allergies  Allergen Reactions  . Aspirin Anaphylaxis, Shortness Of Breath, Rash and Other (See Comments)    "broke out in white welts; red blotches neck and face; windpipe closed; ~ 1962"  . Tape Other (See Comments)    Patient's skin is VERY THIN and tears and bruises easily!!    Consultations:  GI/nephrology/palliative care   Procedures/Studies: Dg Chest 2 View  Result Date: 04/10/2017 CLINICAL DATA:  Shortness of breath. EXAM: CHEST  2 VIEW COMPARISON:  Radiograph 03/10/2017 FINDINGS: Post median sternotomy with prosthetic aortic valve. Prior left atrial clipping. Unchanged cardiomegaly. Progressive pulmonary edema from prior exam. Increased bilateral pleural effusions. No pneumothorax. Left shoulder surgical hardware is partially included. IMPRESSION: Progressive pulmonary edema and increased bilateral pleural effusions consistent with worsening CHF. Electronically Signed   By: Jeb Levering M.D.   On: 04/10/2017 03:21   US Renal  Result Date: 04/12/2017 CLINICAL DATA:  Acute kidney injury EXAM: RENAL / URINARY TRACT ULTRASOUND COMPLETE COMPARISON:  CT abdomen pelvis 06/29/2011 FINDINGS: Right Kidney: Length: 11.2 cm. Cortical thinning diffusely with increased echogenicity of the cortex. Right upper pole cyst 14 mm. Negative for hydronephrosis or mass. Left Kidney: Length: 11.2 cm. Mild increased echogenicity of the cortex. Left upper pole cyst 2.4 cm. Negative for mass or hydronephrosis. Bladder: Appears normal for degree of bladder distention. IMPRESSION: Increased echogenicity of the renal cortex bilaterally. Cortical thinning noted in the right kidney. Bilateral renal cysts. Negative for hydronephrosis. Electronically Signed   By: Franchot Gallo M.D.   On: 04/12/2017 13:04   Ir Fluoro Guide Cv Line Left  Result Date: 04/17/2017 INDICATION: 84 year old male with acute CHF and acute  exacerbation of chronic kidney disease now requiring hemodialysis. He presents for placement of a tunneled hemodialysis catheter. He has a vesicular rash overlying his right chest that appears to represent herpes zoster. Therefore, we will proceed with placement of a left-sided HD catheter. EXAM: TUNNELED CENTRAL VENOUS HEMODIALYSIS CATHETER PLACEMENT WITH ULTRASOUND AND FLUOROSCOPIC GUIDANCE MEDICATIONS: 2 g Ancef. The antibiotic was given in an appropriate time interval prior to skin puncture. ANESTHESIA/SEDATION: 25 mg mcg fentanyl administered intravenously for pain control. This does not constitute  conscious sedation. FLUOROSCOPY TIME:  Fluoroscopy Time: 1 minutes 0 seconds (3 mGy). COMPLICATIONS: None immediate. PROCEDURE: Informed written consent was obtained from the patient after a discussion of the risks, benefits, and alternatives to treatment. Questions regarding the procedure were encouraged and answered. The left neck and chest were prepped with chlorhexidine in a sterile fashion, and a sterile drape was applied covering the operative field. Maximum barrier sterile technique with sterile gowns and gloves were used for the procedure. A timeout was performed prior to the initiation of the procedure. After creating a small venotomy incision, a micropuncture kit was utilized to access the left internal jugular vein under direct, real-time ultrasound guidance after the overlying soft tissues were anesthetized with 1% lidocaine with epinephrine. Ultrasound image documentation was performed. The microwire was kinked to measure appropriate catheter length. A stiff Glidewire was advanced to the level of the IVC and the micropuncture sheath was exchanged for a peel-away sheath. A palindrome tunneled hemodialysis catheter measuring 28 cm from tip to cuff was tunneled in a retrograde fashion from the anterior chest wall to the venotomy incision. The catheter was then placed through the peel-away sheath with tips  ultimately positioned within the superior aspect of the right atrium. Final catheter positioning was confirmed and documented with a spot radiographic image. The catheter aspirates and flushes normally. The catheter was flushed with appropriate volume heparin dwells. The catheter exit site was secured with a 0-Prolene retention suture. The venotomy incision was closed with Dermabond. Dressings were applied. The patient tolerated the procedure well without immediate post procedural complication. IMPRESSION: Successful placement of 28 cm tip to cuff tunneled hemodialysis catheter via the left internal jugular vein with tips terminating within the superior aspect of the right atrium. The catheter is ready for immediate use. Electronically Signed   By: Jacqulynn Cadet M.D.   On: 04/17/2017 10:42   Ir US Guide Vasc Access Left  Result Date: 04/17/2017 INDICATION: 84 year old male with acute CHF and acute exacerbation of chronic kidney disease now requiring hemodialysis. He presents for placement of a tunneled hemodialysis catheter. He has a vesicular rash overlying his right chest that appears to represent herpes zoster. Therefore, we will proceed with placement of a left-sided HD catheter. EXAM: TUNNELED CENTRAL VENOUS HEMODIALYSIS CATHETER PLACEMENT WITH ULTRASOUND AND FLUOROSCOPIC GUIDANCE MEDICATIONS: 2 g Ancef. The antibiotic was given in an appropriate time interval prior to skin puncture. ANESTHESIA/SEDATION: 25 mg mcg fentanyl administered intravenously for pain control. This does not constitute conscious sedation. FLUOROSCOPY TIME:  Fluoroscopy Time: 1 minutes 0 seconds (3 mGy). COMPLICATIONS: None immediate. PROCEDURE: Informed written consent was obtained from the patient after a discussion of the risks, benefits, and alternatives to treatment. Questions regarding the procedure were encouraged and answered. The left neck and chest were prepped with chlorhexidine in a sterile fashion, and a sterile drape  was applied covering the operative field. Maximum barrier sterile technique with sterile gowns and gloves were used for the procedure. A timeout was performed prior to the initiation of the procedure. After creating a small venotomy incision, a micropuncture kit was utilized to access the left internal jugular vein under direct, real-time ultrasound guidance after the overlying soft tissues were anesthetized with 1% lidocaine with epinephrine. Ultrasound image documentation was performed. The microwire was kinked to measure appropriate catheter length. A stiff Glidewire was advanced to the level of the IVC and the micropuncture sheath was exchanged for a peel-away sheath. A palindrome tunneled hemodialysis catheter measuring 28 cm from tip to  cuff was tunneled in a retrograde fashion from the anterior chest wall to the venotomy incision. The catheter was then placed through the peel-away sheath with tips ultimately positioned within the superior aspect of the right atrium. Final catheter positioning was confirmed and documented with a spot radiographic image. The catheter aspirates and flushes normally. The catheter was flushed with appropriate volume heparin dwells. The catheter exit site was secured with a 0-Prolene retention suture. The venotomy incision was closed with Dermabond. Dressings were applied. The patient tolerated the procedure well without immediate post procedural complication. IMPRESSION: Successful placement of 28 cm tip to cuff tunneled hemodialysis catheter via the left internal jugular vein with tips terminating within the superior aspect of the right atrium. The catheter is ready for immediate use. Electronically Signed   By: Jacqulynn Cadet M.D.   On: 04/17/2017 10:42   Dg Chest Port 1 View  Result Date: 04/19/2017 CLINICAL DATA:  Pulmonary edema EXAM: PORTABLE CHEST 1 VIEW COMPARISON:  04/13/2017 FINDINGS: Left jugular dialysis catheter has been placed with its tip at the cavoatrial  junction. Atrial appendage stable remains in place. Hazy hazy airspace disease compatible with pulmonary edema is stable on the left and worse in the right lung. Small right pleural effusion is not excluded. IMPRESSION: Bilateral hazy pulmonary edema is stable on the left and worsening on the right. Small right pleural effusion is not excluded. Electronically Signed   By: Marybelle Killings M.D.   On: 04/19/2017 13:40   Dg Chest Port 1 View  Result Date: 04/13/2017 CLINICAL DATA:  Hypoxia. EXAM: PORTABLE CHEST 1 VIEW COMPARISON:  04/12/2017 FINDINGS: Lungs are adequately inflated and demonstrate continued hazy perihilar bibasilar opacification likely persistent moderate interstitial edema and possible small bilateral pleural effusions. Stable cardiomegaly. Remainder the exam is unchanged. IMPRESSION: Stable cardiomegaly and persistent moderate interstitial edema. Likely small amount of bilateral pleural fluid. Electronically Signed   By: Marin Olp M.D.   On: 04/13/2017 08:05   Dg Chest Port 1 View  Result Date: 04/12/2017 CLINICAL DATA:  Hypoxia EXAM: PORTABLE CHEST 1 VIEW COMPARISON:  04/11/2017 FINDINGS: Diffuse bilateral airspace disease with mild interval progression. Bilateral pleural effusions also with mild progression. Cardiac enlargement. Aortic valve replacement. Left atrial clip. Bibasilar atelectasis. IMPRESSION: Progression of congestive heart failure. Worsening edema and bilateral effusions. Electronically Signed   By: Franchot Gallo M.D.   On: 04/12/2017 06:37   Dg Chest Port 1 View  Result Date: 04/11/2017 CLINICAL DATA:  Shortness of Breath EXAM: PORTABLE CHEST 1 VIEW COMPARISON:  April 10, 2017 FINDINGS: There is cardiomegaly with pulmonary venous hypertension. There are pleural effusions bilaterally with bibasilar atelectasis. No airspace consolidation or pulmonary edema evident. There is a prosthetic aortic valve present. There is a left atrial appendage clamp. No adenopathy. There  is postoperative total shoulder replacement on the left. IMPRESSION: Pulmonary vascular congestion with bibasilar atelectasis and small pleural effusions. No frank edema or consolidation evident. Areas of postoperative change noted. Electronically Signed   By: Lowella Grip III M.D.   On: 04/11/2017 11:19    EGD on 05/08/2017 Findings:      The esophagus was normal.      The stomach was normal.      The examined duodenum was normal.      The cardia and gastric fundus were normal on retroflexion. Impression:               1. Normal EGD  2. Suspect that transient dark Hemoccult-positive                            stool was secondary to swallowed blood from                            reported epistaxis.    Subjective: Patient seen and examined at bedside.  He is very hard of hearing.  No overnight fever, nausea or vomiting or black stools.  Discharge Exam: Vitals:   05/09/17 0425 05/09/17 0819  BP:    Pulse:    Resp:    Temp: 98.1 F (36.7 C) 97.9 F (36.6 C)  SpO2:     Vitals:   05/09/17 0000 05/09/17 0400 05/09/17 0425 05/09/17 0819  BP: (!) 125/55 (!) 136/59    Pulse: 79 75    Resp: 20 18    Temp:   98.1 F (36.7 C) 97.9 F (36.6 C)  TempSrc:   Oral Oral  SpO2: 95% 95%    Weight:      Height:        General: Elderly patient lying in bed.  Very hard of hearing.  Awake Cardiovascular: Rate controlled, S1/S2 + Respiratory: Bilateral decreased breath sounds at bases  abdominal: Soft, NT, ND, bowel sounds + Extremities: no edema, no cyanosis    The results of significant diagnostics from this hospitalization (including imaging, microbiology, ancillary and laboratory) are listed below for reference.     Microbiology: Recent Results (from the past 240 hour(s))  MRSA PCR Screening     Status: None   Collection Time: 05/06/17  6:55 AM  Result Value Ref Range Status   MRSA by PCR NEGATIVE NEGATIVE Final    Comment:        The GeneXpert  MRSA Assay (FDA approved for NASAL specimens only), is one component of a comprehensive MRSA colonization surveillance program. It is not intended to diagnose MRSA infection nor to guide or monitor treatment for MRSA infections. Performed at [facility], [place] [address], [place], [place] [address]      Labs: BNP (last 3 results) Recent Labs    03/10/17 0853 04/10/17 0402  BNP 333.3* 865.7*   Basic Metabolic Panel: Recent Labs  Lab 05/05/17 1400 05/06/17 0224 05/07/17 0738 05/08/17 0419 05/09/17 0410  NA 133* 135 133* 134* 132*  K 4.5 4.3 4.8 3.9 4.5  CL 95* 98* 98* 98* 96*  CO2 _0 GLUCOSE 237* 149* 105* 60* 102*  BUN 36* 43* 52* 20 30*  CREATININE 6.47* 7.04* 7.94* 4.44* 5.41*  CALCIUM 8.9 8.8* 8.5* 7.9* 8.3*  MG  --  2.3  --  2.0 2.1  PHOS  --  4.7* 4.2  --   --    Liver Function Tests: Recent Labs  Lab 05/05/17 1400 05/06/17 0224 05/07/17 0738  AST 23 18  --   ALT 13* 11*  --   ALKPHOS 86 74  --   BILITOT 0.5 0.7  --   PROT 7.0 6.4*  --   ALBUMIN 2.9* 2.7* 2.4*   No results for input(s): LIPASE, AMYLASE in the last 168 hours. No results for input(s): AMMONIA in the last 168 hours. CBC: Recent Labs  Lab 05/05/17 1400 05/06/17 0224 05/07/17 0418 05/08/17 0419 05/09/17 0410  WBC 9.4 10.5 7.9 6.0 6.3  HGB 10.0* 9.2* 8.8* 9.1* 8.8*  HCT 31.5* 28.4* 27.0* 28.9* 27.3*  MCV 96.9 95.0 95.4 95.4 95.8  PLT 195 164 138* 122* 109*   Cardiac Enzymes: No results for input(s): CKTOTAL, CKMB, CKMBINDEX, TROPONINI in the last 168 hours. BNP: Invalid input(s): POCBNP CBG: Recent Labs  Lab 05/08/17 1619 05/08/17 1941 05/09/17 0006 05/09/17 0432 05/09/17 0818  GLUCAP 109* 211* 151* 96 91   D-Dimer No results for input(s): DDIMER in the last 72 hours. Hgb A1c No results for input(s): HGBA1C in the last 72 hours. Lipid Profile No results for input(s): CHOL, HDL, LDLCALC, TRIG, CHOLHDL, LDLDIRECT in the last 72 hours. Thyroid  function studies No results for input(s): TSH, T4TOTAL, T3FREE, THYROIDAB in the last 72 hours.  Invalid input(s): FREET3 Anemia work up No results for input(s): VITAMINB12, FOLATE, FERRITIN, TIBC, IRON, RETICCTPCT in the last 72 hours. Urinalysis    Component Value Date/Time   COLORURINE AMBER (A) 05/05/2017 1959   APPEARANCEUR TURBID (A) 05/05/2017 1959   LABSPEC  05/05/2017 1959    TEST NOT REPORTED DUE TO COLOR INTERFERENCE OF URINE PIGMENT   PHURINE  05/05/2017 1959    TEST NOT REPORTED DUE TO COLOR INTERFERENCE OF URINE PIGMENT   GLUCOSEU (A) 05/05/2017 1959    TEST NOT REPORTED DUE TO COLOR INTERFERENCE OF URINE PIGMENT   HGBUR (A) 05/05/2017 1959    TEST NOT REPORTED DUE TO COLOR INTERFERENCE OF URINE PIGMENT   BILIRUBINUR (A) 05/05/2017 1959    TEST NOT REPORTED DUE TO COLOR INTERFERENCE OF URINE PIGMENT   KETONESUR (A) 05/05/2017 1959    TEST NOT REPORTED DUE TO COLOR INTERFERENCE OF URINE PIGMENT   PROTEINUR (A) 05/05/2017 1959    TEST NOT REPORTED DUE TO COLOR INTERFERENCE OF URINE PIGMENT   UROBILINOGEN 1.0 12/13/2013 1556   NITRITE (A) 05/05/2017 1959    TEST NOT REPORTED DUE TO COLOR INTERFERENCE OF URINE PIGMENT   LEUKOCYTESUR (A) 05/05/2017 1959    TEST NOT REPORTED DUE TO COLOR INTERFERENCE OF URINE PIGMENT   Sepsis Labs Invalid input(s): PROCALCITONIN,  WBC,  LACTICIDVEN Microbiology Recent Results (from the past 240 hour(s))  MRSA PCR Screening     Status: None   Collection Time: 05/06/17  6:55 AM  Result Value Ref Range Status   MRSA by PCR NEGATIVE NEGATIVE Final    Comment:        The GeneXpert MRSA Assay (FDA approved for NASAL specimens only), is one component of a comprehensive MRSA colonization surveillance program. It is not intended to diagnose MRSA infection nor to guide or monitor treatment for MRSA infections. Performed at [facility], [place] [address], [place], [place] [address]      Time coordinating discharge: 35  minutes  SIGNED:   Aline August, MD  [facility] 05/09/2017, 11:18 AM Pager: [phone]  If 7PM-7AM, please contact night-coverage [email or web address] Password [email or web address]

## 2017-05-09 NOTE — Social Work (Signed)
CSW spoke with whole family, they would like pt to be able to back to [facility].   [facility] aware: requesting CSW fax FL2/Labs/PT notes and discharge summary to [phone]  CSW continuing to follow.   [place], [place] Work [phone]

## 2017-05-09 NOTE — Telephone Encounter (Signed)
Left message for patient to call back and schedule an appointment.

## 2017-05-09 NOTE — Consult Note (Signed)
Consultation Note Date: 05/09/2017   Patient Name: Ronald [name]  DOB: 1922-12-02  MRN: [id number]  Age / Sex: 84 y.o., male  PCP: [name], MD Referring Physician: Aline August, MD  Reason for Consultation: Establishing goals of care  HPI/Patient Profile: 84 y.o. male  with past medical history of CHF, COPD, DM, ESRD on HD, GI Bleed, A. Fib on coumadin, HOH admitted on 05/05/2017 with melena and supra therapeutic INR. EGD was completed with negative findings. Palliative medicine consulted for [place].     Clinical Assessment and Goals of Care: ] I have reviewed medical records including EPIC notes, labs and imaging, received report from [name] and Dr. Starla Link, assessed the patient and then met separately along with patient's son Guerry Minors, daughter Lyn Records, son in law [place] and daughter in Industrial/product designer  to discuss diagnosis prognosis, [place], EOL wishes, disposition and options. Dr. Starla Link also participated in discussion at family's request.   I introduced Palliative Medicine as specialized medical care for people living with serious illness. It focuses on providing relief from the symptoms and stress of a serious illness. The goal is to improve quality of life for both the patient and the family.  We discussed a brief life review of the patient. He lives in assisted living at [facility]. 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 Sits at his desk and writes letters. He is not productive at work per se, but family notes he finds sense of purpose in these activities.    As far as functional and nutritional status- he is wheelchair and scooter bound and requires assistance with ADL's. He has good appetite and continues to request to eat.    We discussed their current illness and what it means in the larger context of their on-going co-morbidities.   Natural disease trajectory and expectations at EOL were discussed. Family is aware that patient has multiple co-morbidities affecting his time of life. We discussed multiple co-morbidities of CHF, ESRD on HD, COPD, A. Fib on coumadin with risk for bleeding if continues on coumadin and risk for stroke if coumadin is stopped. Dr. Starla Link reviewed need for decision to continue coumadin vs stopping coumadin.    I attempted to elicit values and goals of care important to the patient.  They note that time and quality of life are important to patient. They feel that quality of life is defined to patient as the ability to go to his workplace.   The difference between aggressive medical intervention and comfort care was considered in light of the patient's goals of care. They agree that if he lost interest in going to his workplace that would be when they would discuss stopping dialysis and other aggressive medical interventions and pursuing comfort care.   Advanced directives, concepts specific to code status, artifical feeding and hydration, and rehospitalization were considered and discussed. Patient has a living will. Family is uncertain what it entails. Encouraged them to review  it and bring copy for review and have it placed on patient's chart. Patient was previously DNR. Family is unsure what lead to him becoming DNR status. After full discussion they believe he would want to remain full code status.   Outpatient Palliative services were explained and offered.   Primary Decision Maker PATIENT with support and assistance of children    SUMMARY OF RECOMMENDATIONS -Care manager referral for outpatient palliative followup for continued GOC and code status discussion with patient and family -Full scope care -Thank you for this referral    Code Status/Advance Care Planning:  Full code  Additional Recommendations (Limitations, Scope, Preferences):  Full Scope Treatment  Prognosis:    Unable to  determine  Discharge Planning: Home with Palliative Services  Primary Diagnoses: Present on Admission: . Tobacco use . Hyperlipidemia . PVD, Rt SFA PTA/HSRA 06/17/11 . GI bleed . Essential hypertension . Diabetes mellitus with complication ([place]) . COPD (chronic obstructive pulmonary disease) ([place]) . ESRD (end stage renal disease) ([place])   I have reviewed the medical record, interviewed the patient and family, and examined the patient. The following aspects are pertinent.  Past Medical History:  Diagnosis Date  . AS (aortic stenosis)    bovine aortic valve replacement 01/07/11  . Bladder cancer [facility])    Bladder Cancer local  . Blood transfusion    w/hip operation  . Cellulitis of left lower extremity   . Chronic atrial fibrillation ([place])   . Chronic diastolic congestive heart failure ([place])   . Claudication in peripheral vascular disease ([place]) 06/17/2011  . COPD (chronic obstructive pulmonary disease) ([place])   . Depression wife died 4 years ago.    Marland Kitchen History of stomach ulcers ~ 1951  . HTN (hypertension)   . Neuropathy due to secondary diabetes ([place])   . Peripheral vascular disease ([place]) very poor circulation legs and feet ... stents right and left legs... done in dr j. Gwenlyn Found 's office.   . Pneumonia 07/25/11   left  . Recurrent upper respiratory infection (URI)    sinusitis  . Renal artery stenosis ([place]) 2006   renal artery stent  . S/P angioplasty with stent, diamond back rotational athrectomy Prox. Rt. SFA 06/17/2011 06/17/2011  . Shortness of breath   . Thrombocytopenia due to drugs    seen by Dr Inda Merlin plts 114000 no rx  . Type II diabetes mellitus ([place])    Social History   Socioeconomic History  . Marital status: Widowed    Spouse name: None  . Number of children: 3  . Years of education: None  . Highest education level: None  Social Needs  . Financial resource strain: None  . Food insecurity - worry: None  . Food insecurity - inability: None  .  Transportation needs - medical: None  . Transportation needs - non-medical: None  Occupational History  . Occupation: Press photographer  Tobacco Use  . Smoking status: Current Every Day Smoker    Packs/day: 1.50    Years: 75.00    Pack years: 112.50    Types: Cigarettes  . Smokeless tobacco: Never Used  Substance and Sexual Activity  . Alcohol use: No    Alcohol/week: 1.2 oz    Types: 2 Cans of beer per week  . Drug use: No  . Sexual activity: No  Other Topics Concern  . None  Social History Narrative  . None   Family History  Problem Relation Age of Onset  . Heart disease Mother   . Cancer Brother  colon  . Stroke Father    Scheduled Meds: . buPROPion  150 mg Oral Daily  . doxazosin  2 mg Oral QHS  . feeding supplement (NEPRO CARB STEADY)  237 mL Oral BID BM  . finasteride  5 mg Oral QHS  . insulin aspart  0-9 Units Subcutaneous Q4H  . metoprolol succinate  12.5 mg Oral BID WC  . multivitamin  1 tablet Oral QHS  . pantoprazole  40 mg Oral Daily  . simvastatin  20 mg Oral QPM  . traMADol  50 mg Oral BID  . warfarin  2 mg Oral ONCE-1800  . Warfarin - Pharmacist Dosing Inpatient   Does not apply q1800   Continuous Infusions: . ferric gluconate (FERRLECIT/NULECIT) IV Stopped (05/08/17 1605)   PRN Meds:.acetaminophen **OR** acetaminophen, albuterol, HYDROcodone-acetaminophen, ondansetron **OR** ondansetron (ZOFRAN) IV Medications Prior to Admission:  Prior to Admission medications   Medication Sig Start Date End Date Taking? Authorizing Provider  acetaminophen (TYLENOL) 325 MG tablet Take 650 mg by mouth every 6 (six) hours as needed ("for pain score 1-4; not to exceed #8 tablets in 24 hours").    Yes [provider]  albuterol (ACCUNEB) 0.63 MG/3ML nebulizer solution Take 3 mLs by nebulization every 6 (six) hours as needed for wheezing or shortness of breath.   Yes [provider]  buPROPion (WELLBUTRIN XL) 150 MG 24 hr tablet Take 150 mg by mouth daily.     Yes [provider]  diphenhydrAMINE (BENADRYL) 12.5 MG/5ML elixir Take 5 mLs (12.5 mg total) by mouth every 6 (six) hours as needed for itching. Over the counter Patient taking differently: Take 12.5 mg by mouth every 6 (six) hours as needed for itching.  04/25/17  Yes Rai, Ripudeep K, MD  doxazosin (CARDURA) 2 MG tablet Take 1 tablet (2 mg total) by mouth at bedtime. 04/25/17  Yes Rai, Ripudeep K, MD  ferrous sulfate 325 (65 FE) MG EC tablet Take 1 tablet (325 mg total) by mouth 2 (two) times daily. 07/04/16 07/04/17 Yes Lavina Hamman, MD  finasteride (PROSCAR) 5 MG tablet Take 5 mg by mouth at bedtime.    Yes [provider]  fluticasone (FLONASE) 50 MCG/ACT nasal spray Place 1 spray into both nostrils 2 (two) times daily.    Yes [provider]  glipiZIDE (GLUCOTROL) 5 MG tablet Take 5 mg by mouth daily before breakfast.   Yes [provider]  loratadine (CLARITIN) 10 MG tablet Take 10 mg by mouth daily.   Yes [provider]  Melatonin 5 MG TABS Take 5 mg by mouth at bedtime.   Yes [provider]  metoprolol succinate (TOPROL-XL) 25 MG 24 hr tablet Take 0.5 tablets (12.5 mg total) by mouth 2 (two) times daily with a meal. 04/26/17  Yes Rai, Ripudeep K, MD  Multiple Vitamins-Minerals (MULTIVITAMIN WITH MINERALS) tablet Take 1 tablet by mouth at bedtime.   Yes [provider]  OXYGEN Inhale 2 L into the lungs continuous.   Yes [provider]  simvastatin (ZOCOR) 20 MG tablet Take 20 mg by mouth every evening.   Yes [provider]  Umeclidinium-Vilanterol (ANORO ELLIPTA) 62.5-25 MCG/INH AEPB Inhale 1 puff into the lungs daily.   Yes [provider]  warfarin (COUMADIN) 2 MG tablet Take 1-2 mg by mouth See admin instructions. Take 2 mg by mouth in the evening on Sun/Mon/Wed/Fri/Sat and 1 mg on Tues/Thurs 12/15/16  Yes [provider]  glyBURIDE (DIABETA) 5 MG tablet Take 1  tablet (5 mg total) by mouth  daily with breakfast. Patient not taking: Reported on 05/05/2017 04/25/17   Rai, Vernelle Emerald, MD  multivitamin (RENA-VIT) TABS tablet Take 1 tablet by mouth at bedtime. Patient not taking: Reported on 05/05/2017 04/25/17   Rai, Vernelle Emerald, MD  pantoprazole (PROTONIX) 40 MG tablet Take 1 tablet (40 mg total) by mouth daily. 05/10/17   Aline August, MD  traMADol (ULTRAM) 50 MG tablet Take 1 tablet (50 mg total) by mouth every 12 (twelve) hours as needed for moderate pain. 05/09/17   Aline August, MD   Allergies  Allergen Reactions  . Aspirin Anaphylaxis, Shortness Of Breath, Rash and Other (See Comments)    "broke out in white welts; red blotches neck and face; windpipe closed; ~ 1962"  . Tape Other (See Comments)    Patient's skin is VERY THIN and tears and bruises easily!!   Review of Systems  Unable to perform ROS: Other    Physical Exam  Constitutional: He appears well-developed and well-nourished.  Cardiovascular: Normal rate.  Neurological: He is alert.  Very HOH  Skin: Skin is warm and dry.  Nursing note and vitals reviewed.   Vital Signs: BP (!) 136/59   Pulse 89   Temp 97.9 F (36.6 C) (Oral)   Resp 18   Ht _0  (1.803 m)   Wt 68.7 kg (151 lb 7.3 oz)   SpO2 95%   BMI 21.12 kg/m  Pain Assessment: No/denies pain   Pain Score: 1    SpO2: SpO2: 95 % O2 Device:SpO2: 95 % O2 Flow Rate: .O2 Flow Rate (L/min): 2 L/min  IO: Intake/output summary:   Intake/Output Summary (Last 24 hours) at 05/09/2017 1153 Last data filed at 05/08/2017 2100 Gross per 24 hour  Intake 710 ml  Output 50 ml  Net 660 ml    LBM: Last BM Date: 05/06/17 Baseline Weight: Weight: 68.6 kg (151 lb 3.8 oz) Most recent weight: Weight: 68.7 kg (151 lb 7.3 oz)     Palliative Assessment/Data: PPS: 50%     Thank you for this consult. Palliative medicine will continue to follow and assist as needed.   Time In: 1030 Time Out: 1145 Time Total: 75 mins Greater than 50%  of this time was spent  counseling and coordinating care related to the above assessment and plan.  Signed by: Mariana Kaufman, AGNP-C Palliative Medicine    Please contact Palliative Medicine Team phone at [phone] for questions and concerns.  For individual provider: See Shea Evans

## 2017-05-09 NOTE — Progress Notes (Signed)
Physical Therapy Treatment Patient Details Name: Ronald [name] MRN: [id number] DOB: [birth year] Today's Date: 05/09/2017    History of Present Illness 84 y.o. male admitted for melena. PMhx: Afib on coumadin, ESRD, chronic diastolic CHF, AVR, COPD, HTN, DM2, Gi bleed, PVD    PT Comments    Pt with limited progression with transfers and gait but continues to demonstrate need for assist with all transfers, gait and mobility. Pt education remains limited by pt hearing difficulty. Family present during session and report pt is weaker with decreased function than PTA and were educated for D/C recommendation and need for assist. Will continue to follow.     Follow Up Recommendations  SNF;Supervision/Assistance - 24 hour     Equipment Recommendations  None recommended by PT    Recommendations for Other Services       Precautions / Restrictions Precautions Precautions: Fall    Mobility  Bed Mobility Overal bed mobility: Needs Assistance Bed Mobility: Supine to Sit     Supine to sit: Min assist     General bed mobility comments: HOB 20 degrees with rail, increased time and assist to raise trunk from surface  Transfers Overall transfer level: Needs assistance   Transfers: Sit to/from Stand Sit to Stand: Min assist Stand pivot transfers: Min assist       General transfer comment: min assist to stand from bed x 3 trials and BSC x1 with RW for pivot bed <>BSC with assist to direct RW  Ambulation/Gait Ambulation/Gait assistance: Min assist Ambulation Distance (Feet): 40 Feet Assistive device: Rolling walker (2 wheeled) Gait Pattern/deviations: Step-through pattern;Decreased stride length;Trunk flexed;Shuffle   Gait velocity interpretation: Below normal speed for age/gender General Gait Details: cues for position in RW and safety, slow short steps limited by fatigue   Stairs            Wheelchair Mobility    Modified Rankin (Stroke Patients Only)        Balance Overall balance assessment: Needs assistance   Sitting balance-Leahy Scale: Good     Standing balance support: Bilateral upper extremity supported Standing balance-Leahy Scale: Poor                              Cognition Arousal/Alertness: Awake/alert Behavior During Therapy: WFL for tasks assessed/performed Overall Cognitive Status: Within Functional Limits for tasks assessed                                        Exercises      General Comments        Pertinent Vitals/Pain Pain Assessment: No/denies pain    Home Living                      Prior Function            PT Goals (current goals can now be found in the care plan section) Progress towards PT goals: Progressing toward goals    Frequency           PT Plan Current plan remains appropriate    Co-evaluation              AM-PAC PT "6 Clicks" Daily Activity  Outcome Measure  Difficulty turning over in bed (including adjusting bedclothes, sheets and blankets)?: Unable Difficulty moving from lying on back to sitting on the side of  the bed? : Unable Difficulty sitting down on and standing up from a chair with arms (e.g., wheelchair, bedside commode, etc,.)?: Unable Help needed moving to and from a bed to chair (including a wheelchair)?: A Little Help needed walking in hospital room?: A Little Help needed climbing 3-5 steps with a railing? : A Lot 6 Click Score: 11    End of Session Equipment Utilized During Treatment: Gait belt Activity Tolerance: Patient tolerated treatment well Patient left: in bed;with call bell/phone within reach;Other (comment)(ready for HD transport) Nurse Communication: Mobility status PT Visit Diagnosis: Muscle weakness (generalized) (M62.81);Difficulty in walking, not elsewhere classified (R26.2);Unsteadiness on feet (R26.81)     Time: 9675-9163 PT Time Calculation (min) (ACUTE ONLY): 29 min  Charges:  $Gait Training:  8-22 mins $Therapeutic Activity: 8-22 mins                    G Codes:       Elwyn Reach, PT [phone]    [place] 05/09/2017, 11:56 AM

## 2017-05-09 NOTE — NC FL2 (Addendum)
[place] MEDICAID FL2 LEVEL OF CARE SCREENING TOOL     IDENTIFICATION  Patient Name: Ronald [name] Birthdate: 1923/01/03 Sex: male Admission Date (Current Location): 05/05/2017  [facility] and [place] Number:  Herbalist and Address:  [facility] [place] [place] [address], [place], [place] [address]      Provider Number: [id number]  Attending Physician Name and Address:  Aline August, MD  Relative Name and Phone Number:  Hurrell, daughter, [phone]    Current Level of Care: Hospital Recommended Level of Care: [place] Prior Approval Number:    Date Approved/Denied:   PASRR Number:    Discharge Plan: Other (Comment)(ALF)    Current Diagnoses: Patient Active Problem List   Diagnosis Date Noted  . Melena   . Advanced care planning/counseling discussion   . Coagulopathy ([place])   . ESRD (end stage renal disease) ([place]) 05/05/2017  . Acute on chronic congestive heart failure ([place])   . Acute renal failure superimposed on stage 3 chronic kidney disease ([place])   . Goals of care, counseling/discussion   . DNR (do not resuscitate) discussion   . Palliative care encounter   . Acute respiratory distress 03/10/2017  . Skin excoriation 03/10/2017  . Closed fracture of left proximal humerus 12/26/2016  . Comminuted left humeral fracture 12/23/2016  . GI bleed 07/02/2016  . Lactic acidemia 07/02/2016  . Lower GI bleed   . Tobacco use 03/07/2015  . Diabetes mellitus type 2, uncontrolled ([place]) 12/20/2013  . Acute blood loss anemia 12/20/2013  . Allergic rhinitis 12/20/2013  . Intertrochanteric fracture of left femur ([place]) 12/12/2013  . Anemia 12/12/2013  . Right acetabular fracture ([place]) 04/11/2013  . Acetabular fracture ([place]) 04/11/2013  . Bilateral claudication of lower limb ([place]) 12/24/2012  . Cellulitis 12/02/2012  . History of tobacco abuse- 75 years, quit in Feb 2014 07/06/2012  . Acute on chronic diastolic (congestive) heart  failure ([place]) 07/06/2012  . COPD (chronic obstructive pulmonary disease) ([place]) 05/27/2012  . Pulmonary nodules 05/27/2012  . Normal coronary arteries, cath 11/12 07/25/2011  . Normal left ventricular systolic function, Echo 9/93 07/25/2011  . Closed right hip fracture, ORIF 08/26/67 complicated by gluteal hematoma while being Coumadinized post op 06/27/2011  . Aspirin allergy, rash, SOB. Pt is on Plavix 06/19/2011  . Chronic atrial fibrillation ([place]) 06/19/2011  . Thrombocytopenia ([place]) 01/11/2011  . Leukocytosis 01/11/2011  . S/P aortic valve replacement: #23 Magna Ease Edwards Pericardial Valve  November 2012 01/08/2011  . AS (aortic stenosis)- s/p tissue AVR 01/07/11   . CAROTID BRUIT- moderate ICA disease 5/14 05/31/2008  . Diabetes mellitus with complication ([place]) 67/89/3810  . Hyperlipidemia 07/11/2007  . DEPRESSION 07/11/2007  . RESTLESS LEGS SYNDROME 07/11/2007  . Essential hypertension 07/11/2007  . PVD, Rt SFA PTA/HSRA 06/17/11 07/11/2007  . DIVERTICULOSIS, COLON 07/11/2007  . RENAL CYST 07/11/2007  . ARTHRITIS 07/11/2007  . SKIN CANCER, HX OF 07/11/2007  . RHEUMATIC HEART DISEASE, HX OF 07/11/2007    Orientation RESPIRATION BLADDER Height & Weight     Self, Time, Situation, Place  Normal Continent Weight: 151 lb 7.3 oz (68.7 kg) Height:  5\' 11"  (180.3 cm)  BEHAVIORAL SYMPTOMS/MOOD NEUROLOGICAL BOWEL NUTRITION STATUS      Continent Diet(Regular)-CCHO diet   AMBULATORY STATUS COMMUNICATION OF NEEDS Skin   Limited Assist Verbally Normal                       Personal Care Assistance Level of Assistance  Bathing, Feeding, Dressing Bathing Assistance: Limited assistance Feeding assistance: Independent Dressing Assistance: Limited assistance     Functional Limitations Info  Hearing, Sight, Speech Sight Info: Adequate Hearing Info: Impaired Speech Info: Adequate    SPECIAL CARE FACTORS FREQUENCY  Follow Up  Palliative Care Follow Up                   Contractures      Additional Factors Info  Code Status Allergies Psychotropic Code Status Info:  Full Code Allergies Info: Aspirin, Tape Psychotropic Info: Wellbutrin        Current Medications (05/09/2017):  This is the current hospital active medication list Current Facility-Administered Medications  Medication Dose Route Frequency Provider Last Rate Last Dose  . acetaminophen (TYLENOL) tablet 650 mg  650 mg Oral Q6H PRN [name], MD   650 mg at 05/08/17 0758   Or  . acetaminophen (TYLENOL) suppository 650 mg  650 mg Rectal Q6H PRN Doutova, Anastassia, MD      . albuterol (PROVENTIL) (2.5 MG/3ML) 0.083% nebulizer solution 3 mL  3 mL Nebulization Q6H PRN Doutova, Anastassia, MD      . buPROPion (WELLBUTRIN XL) 24 hr tablet 150 mg  150 mg Oral Daily Doutova, Anastassia, MD   150 mg at 05/09/17 1041  . doxazosin (CARDURA) tablet 2 mg  2 mg Oral QHS Doutova, Anastassia, MD   2 mg at 05/08/17 2131  . feeding supplement (NEPRO CARB STEADY) liquid 237 mL  237 mL Oral BID BM Roney Jaffe, MD   237 mL at 05/09/17 1041  . ferric gluconate (NULECIT) 125 mg in sodium chloride 0.9 % 100 mL IVPB  125 mg Intravenous Daily Alric Seton, PA-C   Stopped at 05/08/17 1605  . finasteride (PROSCAR) tablet 5 mg  5 mg Oral QHS [name], MD   5 mg at 05/08/17 2131  . HYDROcodone-acetaminophen (NORCO/VICODIN) 5-325 MG per tablet 1-2 tablet  1-2 tablet Oral Q4H PRN Doutova, Anastassia, MD      . insulin aspart (novoLOG) injection 0-9 Units  0-9 Units Subcutaneous Q4H [name], MD   2 Units at 05/09/17 0036  . metoprolol succinate (TOPROL-XL) 24 hr tablet 12.5 mg  12.5 mg Oral BID WC Doutova, Anastassia, MD   12.5 mg at 05/08/17 1738  . multivitamin (RENA-VIT) tablet 1 tablet  1 tablet Oral QHS Alric Seton, PA-C   1 tablet at 05/08/17 2131  . ondansetron (ZOFRAN) tablet 4 mg  4 mg Oral Q6H PRN Doutova, Anastassia, MD       Or  . ondansetron (ZOFRAN) injection 4 mg   4 mg Intravenous Q6H PRN Doutova, Anastassia, MD      . pantoprazole (PROTONIX) EC tablet 40 mg  40 mg Oral Daily Roney Jaffe, MD   40 mg at 05/09/17 1041  . simvastatin (ZOCOR) tablet 20 mg  20 mg Oral QPM Doutova, Anastassia, MD   20 mg at 05/08/17 1738  . traMADol (ULTRAM) tablet 50 mg  50 mg Oral BID [name], MD   50 mg at 05/09/17 1043  . warfarin (COUMADIN) tablet 2 mg  2 mg Oral ONCE-1800 Wynell Balloon, RPH      . Warfarin - Pharmacist Dosing Inpatient   Does not apply q1800 Wynell Balloon [facility]         Discharge Medications: Please see discharge summary for a list of discharge medications.  Relevant Imaging Results:  Relevant Lab Results:   Additional Information SS#: [id number]  Set up with outpatient hemodialysis M/W/Fr; pt to have palliative follow up.   Alexander Mt, LCSWA

## 2017-05-09 NOTE — Progress Notes (Signed)
Pt agreed to go to dialysis after eating his breakfast. Pt had eggs, bacon, grits and cheese for breakfast. His brother bought him breakfast and understand he is on a renal diet. Dialysis called and agreed to see pt between 11-1130a today. PT consulted for eval per [facility], Alekh.

## 2017-05-09 NOTE — Progress Notes (Signed)
[name] ([name]) called this morning and made aware pt refused dialysis today. [name] voiced his appreciation and stated he will talk with him. Writer made [name] aware pt stated he will not do dialysis here at the hospital at all. Off going staff made nephrology service aware of refusal. Will continue to monitor.
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
## 2017-05-09 NOTE — Clinical Social Work Placement (Signed)
   CLINICAL SOCIAL WORK PLACEMENT  NOTE [facility] RN to call report to [phone] and ask for Nurses Station   Date:  05/09/2017  Patient Details  Name: Ronald [name] MRN: [id number] Date of Birth: 1922-12-01  Clinical Social Work is seeking post-discharge placement for this patient at the [place] level of care (*CSW will initial, date and re-position this form in  chart as items are completed):  No(Pt resident at [facility])   Patient/family provided with [place] Work Department's list of facilities offering this level of care within the geographic area requested by the patient (or if unable, by the patient's family).  No   Patient/family informed of their freedom to choose among providers that offer the needed level of care, that participate in Medicare, Medicaid or managed care program needed by the patient, have an available bed and are willing to accept the patient.  No   Patient/family informed of [facility]'s ownership interest in [facility] and [facility], as well as of the fact that they are under no obligation to receive care at these facilities.  PASRR submitted to EDS on       PASRR number received on       Existing PASRR number confirmed on       FL2 transmitted to all facilities in geographic area requested by pt/family on       FL2 transmitted to all facilities within larger geographic area on       Patient informed that his/her managed care company has contracts with or will negotiate with certain facilities, including the following:        Yes   Patient/family informed of bed offers received.  Patient chooses bed at [facility]     Physician recommends and patient chooses bed at      Patient to be transferred to [facility] on 05/09/17.  Patient to be transferred to facility by son, [name], in personal car     Patient family notified on 05/09/17 of  transfer.  Name of family member notified:  [name], son and [name], daughter     PHYSICIAN       Additional Comment:    _______________________________________________ Alexander Mt, [place] 05/09/2017, 3:41 PM

## 2017-05-09 NOTE — Telephone Encounter (Signed)
Per the discharge summary from the hospital pt needs to follow-up with GI in 1-2 weeks. May see a midlevel.

## 2017-05-09 NOTE — Progress Notes (Signed)
Admit: 05/05/2017 LOS: 4  Subjective:  Negative EGD yesterday Wants to go home, doesn't like inpatient HD Palliative meeting this AM For HD today Brother and Granddaughter present  03/14 0701 - 03/15 0700 In: 43 [P.O.:600; IV Piggyback:110] Out: 50 [Urine:50]  Filed Weights   05/06/17 0640 05/07/17 0721 05/07/17 1131  Weight: 68.6 kg (151 lb 3.8 oz) 71.2 kg (156 lb 15.5 oz) 68.7 kg (151 lb 7.3 oz)    Scheduled Meds: . buPROPion  150 mg Oral Daily  . doxazosin  2 mg Oral QHS  . feeding supplement (NEPRO CARB STEADY)  237 mL Oral BID BM  . finasteride  5 mg Oral QHS  . insulin aspart  0-9 Units Subcutaneous Q4H  . metoprolol succinate  12.5 mg Oral BID WC  . multivitamin  1 tablet Oral QHS  . pantoprazole  40 mg Oral Daily  . simvastatin  20 mg Oral QPM  . traMADol  50 mg Oral BID   Continuous Infusions: . ferric gluconate (FERRLECIT/NULECIT) IV Stopped (05/08/17 1605)   PRN Meds:.acetaminophen **OR** acetaminophen, albuterol, HYDROcodone-acetaminophen, ondansetron **OR** ondansetron (ZOFRAN) IV  Current Labs: reviewed    Physical Exam:  Blood pressure (!) 136/59, pulse 75, temperature 97.9 F (36.6 C), temperature source Oral, resp. rate 18, height 5\' 11"  (1.803 m), weight 68.7 kg (151 lb 7.3 oz), SpO2 95 %. NAD IRIR, nl s1s2 Nonfocal HOH, NCAD CTAB TDC L IJG, LUE AVG  Dialysis Orders: [facility] MWF 4 hr 2K 2.5 Ca 400/800 EDW 69.5 no heparin venofer 50/week Mircera 100 q 2 wks last 3/6 left IJ TDC and left upper AVGG placed 2/28 Recent labs: hgb 9.3 3/4 3.1 3/6 28% sat 3/4 iPTH 102  A 1. GIB, for EGD with INR improved; EGD negative 3/14 2. ESRD MWF on schedule 3. HTN/VOl: probe down EDW as able 4. Anemia: stable today.  Fe/ESA 5. Hx/o AVF 6. AFib prev on warfarin, held currently 7. Tobacco user  P 1. HD today: 4h, 3K, 1-2L UF BP permitting, no heparin, TDC;  2. OK with discharge after HD, pending palliative care meeting   Pearson Grippe MD 05/09/2017, 10:27  AM  Recent Labs  Lab 05/06/17 0224 05/07/17 0738 05/08/17 0419 05/09/17 0410  NA 135 133* 134* 132*  K 4.3 4.8 3.9 4.5  CL 98* 98* 98* 96*  CO2 23 23 26 23   GLUCOSE 149* 105* 60* 102*  BUN 43* 52* 20 30*  CREATININE 7.04* 7.94* 4.44* 5.41*  CALCIUM 8.8* 8.5* 7.9* 8.3*  PHOS 4.7* 4.2  --   --    Recent Labs  Lab 05/07/17 0418 05/08/17 0419 05/09/17 0410  WBC 7.9 6.0 6.3  HGB 8.8* 9.1* 8.8*  HCT 27.0* 28.9* 27.3*  MCV 95.4 95.4 95.8  PLT 138* 122* 109*

## 2017-05-09 NOTE — Progress Notes (Signed)
Writer attempted to called report to [facility] was unable to reach a nurse. Writer spoke with receptionist and she stated per Development worker, international aid, they are unable to accept the patient tonight as their nurses leave at [place] notified and will reach out to MD to keep patient for one more night. Report will be called to [facility] in the morning. Family and patient made aware.

## 2017-05-09 NOTE — Progress Notes (Signed)
ANTICOAGULATION CONSULT NOTE - Initial Consult  Pharmacy Consult for warfarin Indication: atrial fibrillation  Allergies  Allergen Reactions  . Aspirin Anaphylaxis, Shortness Of Breath, Rash and Other (See Comments)    "broke out in white welts; red blotches neck and face; windpipe closed; ~ 1962"  . Tape Other (See Comments)    Patient's skin is VERY THIN and tears and bruises easily!!    Patient Measurements: Height: 5\' 11"  (180.3 cm) Weight: 151 lb 7.3 oz (68.7 kg) IBW/kg (Calculated) : 75.3  Vital Signs: Temp: 97.9 F (36.6 C) (03/15 0819) Temp Source: Oral (03/15 0819) BP: 136/59 (03/15 0400) Pulse Rate: 75 (03/15 0400)  Labs: Recent Labs    05/07/17 0418 05/07/17 0738 05/08/17 0419 05/09/17 0410  HGB 8.8*  --  9.1* 8.8*  HCT 27.0*  --  28.9* 27.3*  PLT 138*  --  122* 109*  LABPROT 26.4*  --  20.1* 18.1*  INR 2.45  --  1.73 1.51  CREATININE  --  7.94* 4.44* 5.41*    Estimated Creatinine Clearance: 8.3 mL/min (A) (by C-G formula based on SCr of 5.41 mg/dL (H)).  Assessment: CC/HPI: 84 yo m originally presenting w GIB  PMH: ESRD afib  Anticoag: warfarin pta for afib. Resume warfarin 3/15; no doses inpt so far. INR 1.51  PTA dose 2 mg SMWFSa, 1 mg Tu/Th   Heme/Onc: H&H 8.8/27.3, Plt 109  Goal of Therapy:  INR 2-3 Monitor platelets by anticoagulation protocol: Yes   Plan:  Warfarin 2 mg x 1 Daily INR Pt may dc today  Levester Fresh, PharmD, BCPS, BCCCP Clinical Pharmacist Clinical phone for 05/09/2017 from 7a-3:30p: [phone] If after 3:30p, please call main pharmacy at: [phone] 05/09/2017 10:44 AM

## 2017-05-09 NOTE — Consult Note (Signed)
   [facility] Inpatient Consult   05/09/2017  Jatavian Calica [facility] 02-25-1923 [id number]    [facility] Care Management follow up.  Chart reviewed. Noted Mr. Besson is returning to [facility] today.  Also reviewed palliative medicine notes.   Will update [facility] RNCM.   Marthenia Rolling, MSN-Ed, RN,BSN [facility] Liaison [phone]

## 2017-05-09 NOTE — Social Work (Addendum)
Clinical Social Worker facilitated patient discharge including contacting patient family and facility to confirm patient discharge plans.  Clinical information faxed to facility and family agreeable with plan.  CSW arranged ambulance transport via [facility] to [facility] RN to call report to [phone] and ask for [place] Social Worker will sign off for now as social work intervention is no longer needed. Please consult [place] again if new need arises.  5:21pm- RN on floor called CSW. [facility] will have to take pt tomorrow morning as he did not get back from dialysis until after 5:00pm and they do not have intake RN on to take pt. CSW has paged MD. RN will call pt son regarding changes to discharge timing.   Alexander Mt, [place] Social Worker

## 2017-05-10 LAB — GLUCOSE, CAPILLARY
GLUCOSE-CAPILLARY: 91 mg/dL (ref 65–99)
Glucose-Capillary: 74 mg/dL (ref 65–99)

## 2017-05-10 LAB — BASIC METABOLIC PANEL
Anion gap: 10 (ref 5–15)
BUN: 15 mg/dL (ref 6–20)
CHLORIDE: 99 mmol/L — AB (ref 101–111)
CO2: 27 mmol/L (ref 22–32)
Calcium: 8.1 mg/dL — ABNORMAL LOW (ref 8.9–10.3)
Creatinine, Ser: 3.35 mg/dL — ABNORMAL HIGH (ref 0.61–1.24)
GFR calc Af Amer: 17 mL/min — ABNORMAL LOW (ref >60)
GFR calc non Af Amer: 15 mL/min — ABNORMAL LOW (ref >60)
GLUCOSE: 95 mg/dL (ref 65–99)
POTASSIUM: 4 mmol/L (ref 3.5–5.1)
Sodium: 136 mmol/L (ref 135–145)

## 2017-05-10 LAB — PROTIME-INR
INR: 1.33
PROTHROMBIN TIME: 16.4 s — AB (ref 11.4–15.2)

## 2017-05-10 MED ORDER — WARFARIN SODIUM 2 MG PO TABS: 2.0000 mg | ORAL_TABLET | Freq: Once | ORAL | Status: DC

## 2017-05-10 NOTE — Progress Notes (Signed)
[name], male   DOB: 07-16-1922, 84 y.o.   MRN: [id number] Patient was supposed to be discharged on 05/09/2017 to assisted living facility but this could not happen for logistic reasons.  He will be discharged today.  Please refer to the full discharge summary done by me on 05/09/2017 for full details.  Coumadin has been held as per the discharge summary.

## 2017-05-10 NOTE — Progress Notes (Signed)
Upon assessment pt with active nosebleed, small amount of drainage. Pt states that he has nosebleeds often at home. Notified oncall MD with no new orders recieved. Will continue to monitor.

## 2017-05-10 NOTE — Progress Notes (Signed)
CSW spoke with representative from [facility]) and was informed that they are expecting pt today. CSW was informed that all information has been sent over to the facility at this time. CSW has update RN and son of transport at this time. At this time there are no further CSW intervention needed. CSW signing off.     Virgie Dad Nykiah Ma, MSW, [facility] Clinical Social Worker [phone]

## 2017-05-10 NOTE — Progress Notes (Signed)
ANTICOAGULATION CONSULT NOTE - Initial Consult  Pharmacy Consult for warfarin Indication: atrial fibrillation  Allergies  Allergen Reactions  . Aspirin Anaphylaxis, Shortness Of Breath, Rash and Other (See Comments)    "broke out in white welts; red blotches neck and face; windpipe closed; ~ 1962"  . Tape Other (See Comments)    Patient's skin is VERY THIN and tears and bruises easily!!    Patient Measurements: Height: 5\' 11"  (180.3 cm) Weight: 152 lb 8.9 oz (69.2 kg) IBW/kg (Calculated) : 75.3  Vital Signs: Temp: 98.7 F (37.1 C) (03/16 0800) Temp Source: Oral (03/16 0800) BP: 142/68 (03/16 0400) Pulse Rate: 91 (03/16 0400)  Labs: Recent Labs    05/08/17 0419 05/09/17 0410 05/10/17 0408  HGB 9.1* 8.8*  --   HCT 28.9* 27.3*  --   PLT 122* 109*  --   LABPROT 20.1* 18.1* 16.4*  INR 1.73 1.51 1.33  CREATININE 4.44* 5.41* 3.35*    Estimated Creatinine Clearance: 13.5 mL/min (A) (by C-G formula based on SCr of 3.35 mg/dL (H)).  Assessment: 84 yo m originally presenting w GIB. Warfarin was held, but restarted on 3/15 at home dose of 2 mg Sun, Mon, Wed, Fri, Sat, 1 mg Tu/Th. No further bleeding noted, CBC low but stable. DDI noted with simvastatin and PO intake adequate.  Goal of Therapy:  INR 2-3 Monitor platelets by anticoagulation protocol: Yes   Plan:  Warfarin 2 mg PO x 1 Daily INR, CBCs F/u s/sx bleeding, PO intake, DDI  Nida Boatman, PharmD PGY1 Acute Care Pharmacy Resident Pager: [phone] 05/10/2017 9:40 AM

## 2017-05-10 NOTE — Progress Notes (Signed)
CSW spoke with representative from [facility] to see what time CSW could send pt to the facility. CSW left VM for nursing to call CSW back to gather a time and when to send pt. CSW remain available for support at this time.     Virgie Dad Chosen Geske, MSW, [facility] Clinical Social Worker [phone]

## 2017-05-12 ENCOUNTER — Encounter: Payer: Self-pay | Admitting: Nurse Practitioner

## 2017-05-12 ENCOUNTER — Other Ambulatory Visit: Payer: Self-pay

## 2017-05-12 NOTE — Patient Outreach (Signed)
[facility]) Care Management  05/12/2017  Ronald [name] Oct 08, 1922 [id number]   Subjective: RNCM spoke with client's son who reports client continues to go to work adding client went to work today and attends hemodialysis.   Objective: none  Assessment: 84 year old admitted 2/14-3/2 with heart failure; new hemodialysis patient. History of heart failure, COPD, atrial fibrillation; diabetes; lower Gastro-Intestinal bleed.  RNCM called for transition of care. RNCM spoke with client's son Ronald [name], Healthcare POA who reports that client's discharge paperwork was given to staff at [facility] They are managing client's medication. RNCM reviewed discharge instructions, medications, and follow up appointments with client's son.   Plan: Follow up call next week.  [name], RN, MSN, [place] Coordinator Cell: [phone]

## 2017-05-13 DIAGNOSIS — Z79899 Other long term (current) drug therapy: Secondary | ICD-10-CM | POA: Diagnosis not present

## 2017-05-14 DIAGNOSIS — B351 Tinea unguium: Secondary | ICD-10-CM | POA: Diagnosis not present

## 2017-05-14 DIAGNOSIS — S90822A Blister (nonthermal), left foot, initial encounter: Secondary | ICD-10-CM | POA: Diagnosis not present

## 2017-05-14 DIAGNOSIS — I739 Peripheral vascular disease, unspecified: Secondary | ICD-10-CM | POA: Diagnosis not present

## 2017-05-15 DIAGNOSIS — I4891 Unspecified atrial fibrillation: Secondary | ICD-10-CM | POA: Diagnosis not present

## 2017-05-15 DIAGNOSIS — S42122D Displaced fracture of acromial process, left shoulder, subsequent encounter for fracture with routine healing: Secondary | ICD-10-CM | POA: Diagnosis not present

## 2017-05-15 DIAGNOSIS — M810 Age-related osteoporosis without current pathological fracture: Secondary | ICD-10-CM | POA: Diagnosis not present

## 2017-05-15 DIAGNOSIS — J449 Chronic obstructive pulmonary disease, unspecified: Secondary | ICD-10-CM | POA: Diagnosis not present

## 2017-05-15 DIAGNOSIS — R269 Unspecified abnormalities of gait and mobility: Secondary | ICD-10-CM | POA: Diagnosis not present

## 2017-05-15 DIAGNOSIS — I5032 Chronic diastolic (congestive) heart failure: Secondary | ICD-10-CM | POA: Diagnosis not present

## 2017-05-15 DIAGNOSIS — S52591A Other fractures of lower end of right radius, initial encounter for closed fracture: Secondary | ICD-10-CM | POA: Diagnosis not present

## 2017-05-15 DIAGNOSIS — R296 Repeated falls: Secondary | ICD-10-CM | POA: Diagnosis not present

## 2017-05-15 DIAGNOSIS — R0602 Shortness of breath: Secondary | ICD-10-CM | POA: Diagnosis not present

## 2017-05-15 DIAGNOSIS — M84359S Stress fracture, hip, unspecified, sequela: Secondary | ICD-10-CM | POA: Diagnosis not present

## 2017-05-20 ENCOUNTER — Inpatient Hospital Stay (HOSPITAL_COMMUNITY)
Admission: EM | Admit: 2017-05-20 | Discharge: 2017-05-26 | DRG: 314 | Disposition: A | Discharge status: Skilled Nursing Facility | Payer: PPO | Attending: Internal Medicine | Admitting: Internal Medicine

## 2017-05-20 ENCOUNTER — Emergency Department (HOSPITAL_COMMUNITY): Payer: PPO

## 2017-05-20 ENCOUNTER — Encounter (HOSPITAL_COMMUNITY): Payer: Self-pay | Admitting: *Deleted

## 2017-05-20 ENCOUNTER — Other Ambulatory Visit: Payer: Self-pay

## 2017-05-20 DIAGNOSIS — Z9049 Acquired absence of other specified parts of digestive tract: Secondary | ICD-10-CM

## 2017-05-20 DIAGNOSIS — Z953 Presence of xenogenic heart valve: Secondary | ICD-10-CM

## 2017-05-20 DIAGNOSIS — E1151 Type 2 diabetes mellitus with diabetic peripheral angiopathy without gangrene: Secondary | ICD-10-CM | POA: Diagnosis present

## 2017-05-20 DIAGNOSIS — R6521 Severe sepsis with septic shock: Secondary | ICD-10-CM | POA: Diagnosis not present

## 2017-05-20 DIAGNOSIS — E1140 Type 2 diabetes mellitus with diabetic neuropathy, unspecified: Secondary | ICD-10-CM | POA: Diagnosis present

## 2017-05-20 DIAGNOSIS — E11649 Type 2 diabetes mellitus with hypoglycemia without coma: Secondary | ICD-10-CM | POA: Diagnosis not present

## 2017-05-20 DIAGNOSIS — Z85828 Personal history of other malignant neoplasm of skin: Secondary | ICD-10-CM

## 2017-05-20 DIAGNOSIS — J90 Pleural effusion, not elsewhere classified: Secondary | ICD-10-CM | POA: Diagnosis not present

## 2017-05-20 DIAGNOSIS — J9811 Atelectasis: Secondary | ICD-10-CM | POA: Diagnosis present

## 2017-05-20 DIAGNOSIS — Z8551 Personal history of malignant neoplasm of bladder: Secondary | ICD-10-CM

## 2017-05-20 DIAGNOSIS — Z7951 Long term (current) use of inhaled steroids: Secondary | ICD-10-CM

## 2017-05-20 DIAGNOSIS — E43 Unspecified severe protein-calorie malnutrition: Secondary | ICD-10-CM

## 2017-05-20 DIAGNOSIS — Z91048 Other nonmedicinal substance allergy status: Secondary | ICD-10-CM | POA: Diagnosis not present

## 2017-05-20 DIAGNOSIS — Y838 Other surgical procedures as the cause of abnormal reaction of the patient, or of later complication, without mention of misadventure at the time of the procedure: Secondary | ICD-10-CM | POA: Diagnosis present

## 2017-05-20 DIAGNOSIS — R64 Cachexia: Secondary | ICD-10-CM | POA: Diagnosis present

## 2017-05-20 DIAGNOSIS — G9349 Other encephalopathy: Secondary | ICD-10-CM | POA: Diagnosis present

## 2017-05-20 DIAGNOSIS — J189 Pneumonia, unspecified organism: Secondary | ICD-10-CM | POA: Diagnosis present

## 2017-05-20 DIAGNOSIS — A419 Sepsis, unspecified organism: Secondary | ICD-10-CM

## 2017-05-20 DIAGNOSIS — Z96612 Presence of left artificial shoulder joint: Secondary | ICD-10-CM | POA: Diagnosis present

## 2017-05-20 DIAGNOSIS — Z800 Family history of malignant neoplasm of digestive organs: Secondary | ICD-10-CM

## 2017-05-20 DIAGNOSIS — Z794 Long term (current) use of insulin: Secondary | ICD-10-CM

## 2017-05-20 DIAGNOSIS — R042 Hemoptysis: Secondary | ICD-10-CM

## 2017-05-20 DIAGNOSIS — D631 Anemia in chronic kidney disease: Secondary | ICD-10-CM | POA: Diagnosis not present

## 2017-05-20 DIAGNOSIS — Z86010 Personal history of colonic polyps: Secondary | ICD-10-CM

## 2017-05-20 DIAGNOSIS — I120 Hypertensive chronic kidney disease with stage 5 chronic kidney disease or end stage renal disease: Secondary | ICD-10-CM | POA: Diagnosis not present

## 2017-05-20 DIAGNOSIS — R1312 Dysphagia, oropharyngeal phase: Secondary | ICD-10-CM | POA: Diagnosis present

## 2017-05-20 DIAGNOSIS — I272 Pulmonary hypertension, unspecified: Secondary | ICD-10-CM | POA: Diagnosis present

## 2017-05-20 DIAGNOSIS — J449 Chronic obstructive pulmonary disease, unspecified: Secondary | ICD-10-CM | POA: Diagnosis not present

## 2017-05-20 DIAGNOSIS — Z992 Dependence on renal dialysis: Secondary | ICD-10-CM | POA: Diagnosis not present

## 2017-05-20 DIAGNOSIS — Z66 Do not resuscitate: Secondary | ICD-10-CM | POA: Diagnosis not present

## 2017-05-20 DIAGNOSIS — N186 End stage renal disease: Secondary | ICD-10-CM | POA: Diagnosis not present

## 2017-05-20 DIAGNOSIS — F17210 Nicotine dependence, cigarettes, uncomplicated: Secondary | ICD-10-CM

## 2017-05-20 DIAGNOSIS — T80211A Bloodstream infection due to central venous catheter, initial encounter: Principal | ICD-10-CM | POA: Diagnosis present

## 2017-05-20 DIAGNOSIS — E1122 Type 2 diabetes mellitus with diabetic chronic kidney disease: Secondary | ICD-10-CM | POA: Diagnosis not present

## 2017-05-20 DIAGNOSIS — I872 Venous insufficiency (chronic) (peripheral): Secondary | ICD-10-CM | POA: Diagnosis not present

## 2017-05-20 DIAGNOSIS — K8689 Other specified diseases of pancreas: Secondary | ICD-10-CM | POA: Diagnosis not present

## 2017-05-20 DIAGNOSIS — R402441 Other coma, without documented Glasgow coma scale score, or with partial score reported, in the field [EMT or ambulance]: Secondary | ICD-10-CM | POA: Diagnosis not present

## 2017-05-20 DIAGNOSIS — M810 Age-related osteoporosis without current pathological fracture: Secondary | ICD-10-CM | POA: Diagnosis present

## 2017-05-20 DIAGNOSIS — R7881 Bacteremia: Secondary | ICD-10-CM | POA: Diagnosis not present

## 2017-05-20 DIAGNOSIS — R410 Disorientation, unspecified: Secondary | ICD-10-CM | POA: Diagnosis not present

## 2017-05-20 DIAGNOSIS — Z79891 Long term (current) use of opiate analgesic: Secondary | ICD-10-CM

## 2017-05-20 DIAGNOSIS — R488 Other symbolic dysfunctions: Secondary | ICD-10-CM | POA: Diagnosis not present

## 2017-05-20 DIAGNOSIS — F0390 Unspecified dementia without behavioral disturbance: Secondary | ICD-10-CM | POA: Diagnosis present

## 2017-05-20 DIAGNOSIS — G2581 Restless legs syndrome: Secondary | ICD-10-CM | POA: Diagnosis not present

## 2017-05-20 DIAGNOSIS — I5032 Chronic diastolic (congestive) heart failure: Secondary | ICD-10-CM | POA: Diagnosis present

## 2017-05-20 DIAGNOSIS — Z952 Presence of prosthetic heart valve: Secondary | ICD-10-CM | POA: Diagnosis not present

## 2017-05-20 DIAGNOSIS — Z681 Body mass index (BMI) 19 or less, adult: Secondary | ICD-10-CM

## 2017-05-20 DIAGNOSIS — J309 Allergic rhinitis, unspecified: Secondary | ICD-10-CM | POA: Diagnosis present

## 2017-05-20 DIAGNOSIS — Z8711 Personal history of peptic ulcer disease: Secondary | ICD-10-CM

## 2017-05-20 DIAGNOSIS — Z9981 Dependence on supplemental oxygen: Secondary | ICD-10-CM

## 2017-05-20 DIAGNOSIS — I482 Chronic atrial fibrillation: Secondary | ICD-10-CM | POA: Diagnosis not present

## 2017-05-20 DIAGNOSIS — I361 Nonrheumatic tricuspid (valve) insufficiency: Secondary | ICD-10-CM | POA: Diagnosis not present

## 2017-05-20 DIAGNOSIS — Z886 Allergy status to analgesic agent status: Secondary | ICD-10-CM | POA: Diagnosis not present

## 2017-05-20 DIAGNOSIS — L89151 Pressure ulcer of sacral region, stage 1: Secondary | ICD-10-CM | POA: Diagnosis present

## 2017-05-20 DIAGNOSIS — Z960 Presence of urogenital implants: Secondary | ICD-10-CM | POA: Diagnosis present

## 2017-05-20 DIAGNOSIS — Z8249 Family history of ischemic heart disease and other diseases of the circulatory system: Secondary | ICD-10-CM

## 2017-05-20 DIAGNOSIS — Z993 Dependence on wheelchair: Secondary | ICD-10-CM

## 2017-05-20 DIAGNOSIS — I509 Heart failure, unspecified: Secondary | ICD-10-CM | POA: Diagnosis not present

## 2017-05-20 DIAGNOSIS — K921 Melena: Secondary | ICD-10-CM | POA: Diagnosis not present

## 2017-05-20 DIAGNOSIS — N179 Acute kidney failure, unspecified: Secondary | ICD-10-CM | POA: Diagnosis not present

## 2017-05-20 DIAGNOSIS — Z7984 Long term (current) use of oral hypoglycemic drugs: Secondary | ICD-10-CM

## 2017-05-20 DIAGNOSIS — B9561 Methicillin susceptible Staphylococcus aureus infection as the cause of diseases classified elsewhere: Secondary | ICD-10-CM | POA: Diagnosis not present

## 2017-05-20 DIAGNOSIS — Z95828 Presence of other vascular implants and grafts: Secondary | ICD-10-CM | POA: Diagnosis not present

## 2017-05-20 DIAGNOSIS — D6959 Other secondary thrombocytopenia: Secondary | ICD-10-CM | POA: Diagnosis present

## 2017-05-20 DIAGNOSIS — H9190 Unspecified hearing loss, unspecified ear: Secondary | ICD-10-CM | POA: Diagnosis present

## 2017-05-20 DIAGNOSIS — A4101 Sepsis due to Methicillin susceptible Staphylococcus aureus: Secondary | ICD-10-CM | POA: Diagnosis present

## 2017-05-20 DIAGNOSIS — N281 Cyst of kidney, acquired: Secondary | ICD-10-CM | POA: Diagnosis not present

## 2017-05-20 DIAGNOSIS — K862 Cyst of pancreas: Secondary | ICD-10-CM | POA: Diagnosis not present

## 2017-05-20 DIAGNOSIS — E8889 Other specified metabolic disorders: Secondary | ICD-10-CM | POA: Diagnosis present

## 2017-05-20 DIAGNOSIS — D699 Hemorrhagic condition, unspecified: Secondary | ICD-10-CM | POA: Diagnosis not present

## 2017-05-20 DIAGNOSIS — D649 Anemia, unspecified: Secondary | ICD-10-CM | POA: Diagnosis not present

## 2017-05-20 DIAGNOSIS — I132 Hypertensive heart and chronic kidney disease with heart failure and with stage 5 chronic kidney disease, or end stage renal disease: Secondary | ICD-10-CM | POA: Diagnosis not present

## 2017-05-20 DIAGNOSIS — Z87891 Personal history of nicotine dependence: Secondary | ICD-10-CM

## 2017-05-20 DIAGNOSIS — M6281 Muscle weakness (generalized): Secondary | ICD-10-CM | POA: Diagnosis not present

## 2017-05-20 DIAGNOSIS — R278 Other lack of coordination: Secondary | ICD-10-CM | POA: Diagnosis not present

## 2017-05-20 DIAGNOSIS — F329 Major depressive disorder, single episode, unspecified: Secondary | ICD-10-CM | POA: Diagnosis not present

## 2017-05-20 DIAGNOSIS — Z961 Presence of intraocular lens: Secondary | ICD-10-CM | POA: Diagnosis present

## 2017-05-20 DIAGNOSIS — R040 Epistaxis: Secondary | ICD-10-CM | POA: Diagnosis not present

## 2017-05-20 LAB — CBC WITH DIFFERENTIAL/PLATELET
BASOS PCT: 0 %
Basophils Absolute: 0 10*3/uL (ref 0.0–0.1)
EOS ABS: 0 10*3/uL (ref 0.0–0.7)
Eosinophils Relative: 0 %
HEMATOCRIT: 30.8 % — AB (ref 39.0–52.0)
HEMOGLOBIN: 9.6 g/dL — AB (ref 13.0–17.0)
LYMPHS ABS: 0.5 10*3/uL — AB (ref 0.7–4.0)
Lymphocytes Relative: 3 %
MCH: 30.9 pg (ref 26.0–34.0)
MCHC: 31.2 g/dL (ref 30.0–36.0)
MCV: 99 fL (ref 78.0–100.0)
Monocytes Absolute: 0.7 10*3/uL (ref 0.1–1.0)
Monocytes Relative: 4 %
NEUTROS ABS: 17.6 10*3/uL — AB (ref 1.7–7.7)
NEUTROS PCT: 93 %
Platelets: 100 10*3/uL — ABNORMAL LOW (ref 150–400)
RBC: 3.11 MIL/uL — AB (ref 4.22–5.81)
RDW: 19.4 % — ABNORMAL HIGH (ref 11.5–15.5)
WBC: 18.9 10*3/uL — AB (ref 4.0–10.5)

## 2017-05-20 LAB — I-STAT TROPONIN, ED: Troponin i, poc: 0.08 ng/mL (ref 0.00–0.08)

## 2017-05-20 LAB — COMPREHENSIVE METABOLIC PANEL
ALBUMIN: 2.5 g/dL — AB (ref 3.5–5.0)
ALT: 18 U/L (ref 17–63)
ANION GAP: 13 (ref 5–15)
AST: 43 U/L — AB (ref 15–41)
Alkaline Phosphatase: 79 U/L (ref 38–126)
BUN: 28 mg/dL — AB (ref 6–20)
CHLORIDE: 99 mmol/L — AB (ref 101–111)
CO2: 25 mmol/L (ref 22–32)
Calcium: 8 mg/dL — ABNORMAL LOW (ref 8.9–10.3)
Creatinine, Ser: 3.94 mg/dL — ABNORMAL HIGH (ref 0.61–1.24)
GFR calc Af Amer: 14 mL/min — ABNORMAL LOW (ref >60)
GFR calc non Af Amer: 12 mL/min — ABNORMAL LOW (ref >60)
GLUCOSE: 62 mg/dL — AB (ref 65–99)
POTASSIUM: 4 mmol/L (ref 3.5–5.1)
Sodium: 137 mmol/L (ref 135–145)
Total Bilirubin: 1.2 mg/dL (ref 0.3–1.2)
Total Protein: 6.5 g/dL (ref 6.5–8.1)

## 2017-05-20 LAB — I-STAT CHEM 8, ED
BUN: 31 mg/dL — ABNORMAL HIGH (ref 6–20)
CALCIUM ION: 1.03 mmol/L — AB (ref 1.15–1.40)
Chloride: 97 mmol/L — ABNORMAL LOW (ref 101–111)
Creatinine, Ser: 4 mg/dL — ABNORMAL HIGH (ref 0.61–1.24)
Glucose, Bld: 60 mg/dL — ABNORMAL LOW (ref 65–99)
HEMATOCRIT: 31 % — AB (ref 39.0–52.0)
HEMOGLOBIN: 10.5 g/dL — AB (ref 13.0–17.0)
Potassium: 4 mmol/L (ref 3.5–5.1)
Sodium: 138 mmol/L (ref 135–145)
TCO2: 29 mmol/L (ref 22–32)

## 2017-05-20 LAB — PROTIME-INR
INR: 1.37
PROTHROMBIN TIME: 16.8 s — AB (ref 11.4–15.2)

## 2017-05-20 LAB — I-STAT CG4 LACTIC ACID, ED
LACTIC ACID, VENOUS: 2.05 mmol/L — AB (ref 0.5–1.9)
LACTIC ACID, VENOUS: 1.43 mmol/L (ref 0.5–1.9)

## 2017-05-20 LAB — POC OCCULT BLOOD, ED: FECAL OCCULT BLD: POSITIVE — AB

## 2017-05-20 MED ORDER — VANCOMYCIN HCL 10 G IV SOLR
1500.0000 mg | Freq: Once | INTRAVENOUS | Status: AC
  Administered 2017-05-20: 1500 mg via INTRAVENOUS
  Filled 2017-05-20: qty 1500

## 2017-05-20 MED ORDER — PIPERACILLIN-TAZOBACTAM IN DEX 2-0.25 GM/50ML IV SOLN
2.2500 g | INTRAVENOUS | Status: AC
  Administered 2017-05-20: 2.25 g via INTRAVENOUS
  Filled 2017-05-20: qty 50

## 2017-05-20 MED ORDER — HYDROCORTISONE NA SUCCINATE PF 100 MG IJ SOLR
100.0000 mg | Freq: Once | INTRAMUSCULAR | Status: AC
  Administered 2017-05-20: 100 mg via INTRAVENOUS
  Filled 2017-05-20: qty 2

## 2017-05-20 MED ORDER — CLINDAMYCIN PHOSPHATE 600 MG/50ML IV SOLN
600.0000 mg | Freq: Three times a day (TID) | INTRAVENOUS | Status: DC
  Administered 2017-05-20: 600 mg via INTRAVENOUS
  Filled 2017-05-20: qty 50

## 2017-05-20 MED ORDER — ACETAMINOPHEN 650 MG RE SUPP
650.0000 mg | Freq: Once | RECTAL | Status: AC
  Administered 2017-05-20: 650 mg via RECTAL
  Filled 2017-05-20: qty 1

## 2017-05-20 MED ORDER — DEXTROSE 5 % AND 0.45 % NACL IV BOLUS
500.0000 mL | Freq: Once | INTRAVENOUS | Status: AC
  Administered 2017-05-20: 500 mL via INTRAVENOUS

## 2017-05-20 MED ORDER — ACETAMINOPHEN 325 MG PO TABS: 650.0000 mg | ORAL_TABLET | Freq: Once | ORAL | Status: DC

## 2017-05-20 MED ORDER — PIPERACILLIN-TAZOBACTAM 3.375 G IVPB
3.3750 g | Freq: Two times a day (BID) | INTRAVENOUS | Status: DC
  Administered 2017-05-21 (×2): 3.375 g via INTRAVENOUS
  Filled 2017-05-20 (×2): qty 50

## 2017-05-20 NOTE — ED Notes (Signed)
Patient comfortably resting. Patient's respirations increase when awakened. Patient went from 9 respirations to 15 once awake.

## 2017-05-20 NOTE — ED Provider Notes (Signed)
[facility] DEPT Provider Note   CSN: [id number] Arrival date & time: 05/20/17  1313     History   Chief Complaint Chief Complaint  Patient presents with  . Emesis  . Blood Infection    HPI Ronald [name] is a 84 y.o. male with a h/o of ESRD on HD (MWF), A fib. Not on anticoagulation, COPD, bladder CA, AS s/p AVR, PVD, DM Type II, and HOH, lower GI bleed from [facility] by EMS who presents to the emergency department with tachycardia, tachypnea, and fatigue.   Patient's family reports that yesterday the patient was not feeling well yesterday.  He had his eyes closed during most of his transport to and from dialysis and complained of being very tired and fatigued.    He was visited by his grandson around 10 AM and continued to complain of being tired, but when family stopped by to see him around 11:30 AM the patient had blood all down the front of his shirt.   He was transported by EMS and refused transport to [facility] hospital. EMS suspected hematemesis as the source of the blood. Placed on 2L [place].  He reports the patient intermittently wears oxygen as needed.  He has been a smoker since the age of 14.  They state they know that he is not feeling well because he has not even wanted a cigarette.  The patient states he noticed the blood after a coughing fit.  No emesis since arrival in the ED.  He has had decreased PO intake and looks dry on exam. He is HOH, but answers questions appropriately.  He can hear better on the right.    On initial evaluation, he has dried dark material that does not have an odor located in the mouth. Rectal temp 103.3 on arrival.  Patient has been on hemodialysis for the last 4-6 weeks.   Patient is a FULL CODE.   Level V caveat secondary to acuity of condition.    The history is provided by the patient. No language interpreter was used.  Emesis   Associated symptoms include cough and a fever.    Past Medical  History:  Diagnosis Date  . AS (aortic stenosis)    bovine aortic valve replacement 01/07/11  . Bladder cancer [facility])    Bladder Cancer local  . Blood transfusion    w/hip operation  . Cellulitis of left lower extremity   . Chronic atrial fibrillation ([place])   . Chronic diastolic congestive heart failure ([place])   . Claudication in peripheral vascular disease ([place]) 06/17/2011  . COPD (chronic obstructive pulmonary disease) ([place])   . Depression wife died 4 years ago.    Marland Kitchen History of stomach ulcers ~ 1951  . HTN (hypertension)   . Neuropathy due to secondary diabetes ([place])   . Peripheral vascular disease ([place]) very poor circulation legs and feet ... stents right and left legs... done in dr j. Gwenlyn Found 's office.   . Pneumonia 07/25/11   left  . Recurrent upper respiratory infection (URI)    sinusitis  . Renal artery stenosis ([place]) 2006   renal artery stent  . S/P angioplasty with stent, diamond back rotational athrectomy Prox. Rt. SFA 06/17/2011 06/17/2011  . Shortness of breath   . Thrombocytopenia due to drugs    seen by Dr Inda Merlin plts [id number] no rx  . Type II diabetes mellitus [facility])     Patient Active Problem List   Diagnosis Date Noted  .  Pressure injury of skin 05/09/2017  . Melena   . Advanced care planning/counseling discussion   . Coagulopathy ([place])   . ESRD (end stage renal disease) ([place]) 05/05/2017  . Acute on chronic congestive heart failure ([place])   . Acute renal failure superimposed on stage 3 chronic kidney disease ([place])   . Goals of care, counseling/discussion   . DNR (do not resuscitate) discussion   . Palliative care encounter   . Acute respiratory distress 03/10/2017  . Skin excoriation 03/10/2017  . Closed fracture of left proximal humerus 12/26/2016  . Comminuted left humeral fracture 12/23/2016  . GI bleed 07/02/2016  . Lactic acidemia 07/02/2016  . Lower GI bleed   . Tobacco use 03/07/2015  . Diabetes mellitus type 2, uncontrolled ([place]) 12/20/2013  . Acute  blood loss anemia 12/20/2013  . Allergic rhinitis 12/20/2013  . Intertrochanteric fracture of left femur ([place]) 12/12/2013  . Anemia 12/12/2013  . Right acetabular fracture ([place]) 04/11/2013  . Acetabular fracture ([place]) 04/11/2013  . Bilateral claudication of lower limb ([place]) 12/24/2012  . Cellulitis 12/02/2012  . History of tobacco abuse- 75 years, quit in Feb 2014 07/06/2012  . Acute on chronic diastolic (congestive) heart failure ([place]) 07/06/2012  . COPD (chronic obstructive pulmonary disease) ([place]) 05/27/2012  . Pulmonary nodules 05/27/2012  . Normal coronary arteries, cath 11/12 07/25/2011  . Normal left ventricular systolic function, Echo 2/13 07/25/2011  . Closed right hip fracture, ORIF 0/8/65 complicated by gluteal hematoma while being Coumadinized post op 06/27/2011  . Aspirin allergy, rash, SOB. Pt is on Plavix 06/19/2011  . Chronic atrial fibrillation ([place]) 06/19/2011  . Thrombocytopenia ([place]) 01/11/2011  . Leukocytosis 01/11/2011  . S/P aortic valve replacement: #23 Magna Ease Edwards Pericardial Valve  November 2012 01/08/2011  . AS (aortic stenosis)- s/p tissue AVR 01/07/11   . CAROTID BRUIT- moderate ICA disease 5/14 05/31/2008  . Diabetes mellitus with complication ([place]) 78/46/9629  . Hyperlipidemia 07/11/2007  . DEPRESSION 07/11/2007  . RESTLESS LEGS SYNDROME 07/11/2007  . Essential hypertension 07/11/2007  . PVD, Rt SFA PTA/HSRA 06/17/11 07/11/2007  . DIVERTICULOSIS, COLON 07/11/2007  . RENAL CYST 07/11/2007  . ARTHRITIS 07/11/2007  . SKIN CANCER, HX OF 07/11/2007  . RHEUMATIC HEART DISEASE, HX OF 07/11/2007    Past Surgical History:  Procedure Laterality Date  . ABDOMINAL ANGIOGRAM  06/17/2011   Procedure: ABDOMINAL ANGIOGRAM;  Surgeon: Lorretta Harp, MD;  Location: [facility] CATH LAB;  Service: Cardiovascular;;  . AORTIC VALVE REPLACEMENT  01/07/2011   Procedure: AORTIC VALVE REPLACEMENT (AVR);  Surgeon: Grace Isaac, MD;  Location: [facility];  Service: Open  Heart Surgery;  Laterality: N/A;; magna-ease bovine 23mm bioprosthesis  . ATHERECTOMY N/A 06/17/2011   Procedure: ATHERECTOMY;  Surgeon: Lorretta Harp, MD;  Location: [facility] CATH LAB;  Service: Cardiovascular;  Laterality: N/A;  . AV FISTULA PLACEMENT Left 04/24/2017   Procedure: INSERTION OF ARTERIOVENOUS (AV) GORE-TEX GRAFT ARM;  Surgeon: Conrad Aurora, MD;  Location: [name];  Service: Vascular;  Laterality: Left;  . CARDIAC CATHETERIZATION  11/19/10   normal coronaries, mod AS, 75% l RAS  . CARDIOVERSION  04/03/2011   Procedure: CARDIOVERSION;  Surgeon: Leonie Man, MD;  Location: [facility];  Service: Cardiovascular;  Laterality: N/A;  . CATARACT EXTRACTION W/ INTRAOCULAR LENS  IMPLANT, BILATERAL  ~ 2007  . CHOLECYSTECTOMY    . ESOPHAGOGASTRODUODENOSCOPY N/A 05/08/2017   Procedure: ESOPHAGOGASTRODUODENOSCOPY (EGD);  Surgeon: Irene Shipper, MD;  Location: [facility] ENDOSCOPY;  Service: Endoscopy;  Laterality: N/A;  .  FEMUR IM NAIL  06/27/2011   Procedure: INTRAMEDULLARY (IM) NAIL FEMORAL;  Surgeon: Marin Shutter, MD;  Location: [facility] ORS;  Service: Orthopedics;  Laterality: Right;  . FEMUR IM NAIL Left 12/14/2013   Procedure: INTRAMEDULLARY (IM) NAIL FEMORAL;  Surgeon: Mauri Pole, MD;  Location: [facility] ORS;  Service: Orthopedics;  Laterality: Left;  . FRACTURE SURGERY    . IR FLUORO GUIDE CV LINE LEFT  04/17/2017  . IR US GUIDE VASC ACCESS LEFT  04/17/2017  . LOWER EXTREMITY ANGIOGRAM  06/17/2011   diamondback orbital rotational and cutting balloon atherectomy of the prox R SFA  . LOWER EXTREMITY ANGIOGRAM Bilateral 06/17/2011   Procedure: LOWER EXTREMITY ANGIOGRAM;  Surgeon: Lorretta Harp, MD;  Location: [facility] CATH LAB;  Service: Cardiovascular;  Laterality: Bilateral;  . PERIPHERAL ARTERIAL STENT GRAFT     2006 left anf right illiac stents Dr Deon Pilling  . RENAL ARTERY STENT  2006   "I believe"  . REVERSE SHOULDER ARTHROPLASTY Left 12/31/2016   Procedure: LEFT REVERSE SHOULDER ARTHROPLASTY;  Surgeon: Nicholes Stairs, MD;  Location: [facility];  Service: Orthopedics;  Laterality: Left;  . TONSILLECTOMY AND ADENOIDECTOMY     "when I was a kid"        Home Medications    Prior to Admission medications   Medication Sig Start Date End Date Taking? Authorizing Provider  acetaminophen (TYLENOL) 325 MG tablet Take 650 mg by mouth every 6 (six) hours as needed ("for pain score 1-4; not to exceed #8 tablets in 24 hours").    Yes [provider]  albuterol (ACCUNEB) 0.63 MG/3ML nebulizer solution Take 3 mLs by nebulization every 6 (six) hours as needed for wheezing or shortness of breath.   Yes [provider]  buPROPion (WELLBUTRIN XL) 150 MG 24 hr tablet Take 150 mg by mouth daily.    Yes [provider]  diphenhydrAMINE (BENADRYL) 12.5 MG/5ML elixir Take 5 mLs (12.5 mg total) by mouth every 6 (six) hours as needed for itching. Over the counter Patient taking differently: Take 12.5 mg by mouth every 6 (six) hours as needed for itching.  04/25/17  Yes Rai, Ripudeep K, MD  doxazosin (CARDURA) 2 MG tablet Take 1 tablet (2 mg total) by mouth at bedtime. 04/25/17  Yes Rai, Ripudeep K, MD  ferrous sulfate 325 (65 FE) MG EC tablet Take 1 tablet (325 mg total) by mouth 2 (two) times daily. Patient taking differently: Take 325 mg by mouth 2 (two) times daily with a meal.  07/04/16 07/04/17 Yes Lavina Hamman, MD  finasteride (PROSCAR) 5 MG tablet Take 5 mg by mouth at bedtime.    Yes [provider]  fluticasone (FLONASE) 50 MCG/ACT nasal spray Place 1 spray into both nostrils 2 (two) times daily.    Yes [provider]  glipiZIDE (GLUCOTROL) 5 MG tablet Take 5 mg by mouth daily before breakfast.   Yes [provider]  loratadine (CLARITIN) 10 MG tablet Take 10 mg by mouth daily.   Yes [provider]  Melatonin 5 MG TABS Take 5 mg by mouth at bedtime.   Yes [provider]  metoprolol succinate (TOPROL-XL) 25 MG 24 hr tablet Take 0.5 tablets  (12.5 mg total) by mouth 2 (two) times daily with a meal. 04/26/17  Yes Rai, Ripudeep K, MD  Multiple Vitamins-Minerals (MULTIVITAMIN WITH MINERALS) tablet Take 1 tablet by mouth at bedtime.   Yes [provider]  neomycin-bacitracin-polymyxin (NEOSPORIN) ointment Apply 1 application topically  daily. Apply to left elbow and forearm daily for 10 days. Started 05/14/17.   Yes [provider]  pantoprazole (PROTONIX) 40 MG tablet Take 1 tablet (40 mg total) by mouth daily. 05/10/17  Yes Aline August, MD  simvastatin (ZOCOR) 20 MG tablet Take 20 mg by mouth every evening.   Yes [provider]  traMADol (ULTRAM) 50 MG tablet Take 1 tablet (50 mg total) by mouth every 12 (twelve) hours as needed for moderate pain. 05/09/17  Yes Aline August, MD  Umeclidinium-Vilanterol (ANORO ELLIPTA) 62.5-25 MCG/INH AEPB Inhale 1 puff into the lungs daily.   Yes [provider]  OXYGEN Inhale 2 L into the lungs continuous.    [provider]    Family History Family History  Problem Relation Age of Onset  . Heart disease Mother   . Cancer Brother        colon  . Stroke Father     Social History Social History   Tobacco Use  . Smoking status: Current Every Day Smoker    Packs/day: 1.50    Years: 75.00    Pack years: 112.50    Types: Cigarettes  . Smokeless tobacco: Never Used  Substance Use Topics  . Alcohol use: No    Alcohol/week: 1.2 oz    Types: 2 Cans of beer per week  . Drug use: No     Allergies   Aspirin and Tape   Review of Systems Review of Systems  Unable to perform ROS: Acuity of condition  Constitutional: Positive for fever.  Respiratory: Positive for cough and shortness of breath.   Allergic/Immunologic: Positive for immunocompromised state.   Physical Exam Updated Vital Signs BP (!) 93/45   Pulse 96   Temp (!) 103.3 F (39.6 C) (Rectal)   Resp 18   SpO2 98%   Physical Exam  Constitutional: Nasal cannula in place.    Chronically ill, cachetic, elderly male.   HENT:  Dark, dried material noted in the oral cavity. No odor, suspect blood.  Eyes: Pupils are equal, round, and reactive to light.  Neck: Normal range of motion. Neck supple. No JVD present. No tracheal deviation present.  Cardiovascular: An irregularly irregular rhythm present. Tachycardia present. Exam reveals no gallop and no friction rub.  Pulmonary/Chest: No stridor. Tachypnea noted. No respiratory distress.  Adventitious breath sounds auscultated in the right mid and lower lobe.  No overt wheezing.  Abdominal: Soft. Bowel sounds are normal. He exhibits no distension and no mass. There is no tenderness. There is no rebound and no guarding. No hernia.  Musculoskeletal: He exhibits no edema, tenderness or deformity.  Venous stasis dermatitis noted over the bilateral lower extremities.  Distal pulses are palpable.  Neurological: He is alert.  Answers questions appropriately.  Follows simple commands.   Skin: Skin is dry. Capillary refill takes 2 to 3 seconds.  Skin appears pale.  Nursing note and vitals reviewed.    ED Treatments / Results  Labs (all labs ordered are listed, but only abnormal results are displayed) Labs Reviewed  COMPREHENSIVE METABOLIC PANEL - Abnormal; Notable for the following components:      Result Value   Chloride 99 (*)    Glucose, Bld 62 (*)    BUN 28 (*)    Creatinine, Ser 3.94 (*)    Calcium 8.0 (*)    Albumin 2.5 (*)    AST 43 (*)    GFR calc non Af Amer 12 (*)    GFR calc Af  Amer 14 (*)    All other components within normal limits  CBC WITH DIFFERENTIAL/PLATELET - Abnormal; Notable for the following components:   WBC 18.9 (*)    RBC 3.11 (*)    Hemoglobin 9.6 (*)    HCT 30.8 (*)    RDW 19.4 (*)    Platelets 100 (*)    Neutro Abs 17.6 (*)    Lymphs Abs 0.5 (*)    All other components within normal limits  PROTIME-INR - Abnormal; Notable for the following components:   Prothrombin Time 16.8 (*)     All other components within normal limits  I-STAT CG4 LACTIC ACID, ED - Abnormal; Notable for the following components:   Lactic Acid, Venous 2.05 (*)    All other components within normal limits  I-STAT CHEM 8, ED - Abnormal; Notable for the following components:   Chloride 97 (*)    BUN 31 (*)    Creatinine, Ser 4.00 (*)    Glucose, Bld 60 (*)    Calcium, Ion 1.03 (*)    Hemoglobin 10.5 (*)    HCT 31.0 (*)    All other components within normal limits  POC OCCULT BLOOD, ED - Abnormal; Notable for the following components:   Fecal Occult Bld POSITIVE (*)    All other components within normal limits  CULTURE, BLOOD (ROUTINE X 2)  CULTURE, BLOOD (ROUTINE X 2)  URINALYSIS, ROUTINE W REFLEX MICROSCOPIC  I-STAT TROPONIN, ED  POCT GASTRIC OCCULT BLOOD (1-CARD TO LAB)  I-STAT CG4 LACTIC ACID, ED    EKG EKG Interpretation  Date/Time:  Tuesday May 20 2017 13:48:17 EDT Ventricular Rate:  109 PR Interval:    QRS Duration: 125 QT Interval:  381 QTC Calculation: 514 R Axis:   -73 Text Interpretation:  Atrial fibrillation Nonspecific IVCD with LAD LVH with secondary repolarization abnormality Anterior Q waves, possibly due to LVH No significant change since last tracing Confirmed by Merrily Pew [phone]) on 05/20/2017 2:25:30 PM   Radiology Dg Chest Portable 1 View  Result Date: 05/20/2017 CLINICAL DATA:  Suspicion of sepsis. EXAM: PORTABLE CHEST 1 VIEW COMPARISON:  Portable chest x-ray of April 19, 2017 FINDINGS: The lungs are well-expanded. The interstitial markings remain increased bilaterally greatest on the right. The cardiac silhouette remains mildly enlarged. The pulmonary vascularity is engorged. The dual-lumen dialysis catheter tip projects at the cavoatrial junction. The sternal wires and left atrial appendage clip are stable. IMPRESSION: CHF with mild interstitial edema. One cannot exclude basilar pneumonia on the right. Electronically Signed   By: David  Martinique M.D.   On:  05/20/2017 15:05    Procedures .Critical Care Performed by: Joanne Gavel, PA-C Authorized by: Joanne Gavel, PA-C   Critical care provider statement:    Critical care time (minutes):  50   Critical care time was exclusive of:  Separately billable procedures and treating other patients and teaching time   Critical care was necessary to treat or prevent imminent or life-threatening deterioration of the following conditions:  Sepsis   Critical care was time spent personally by me on the following activities:  Review of old charts, re-evaluation of patient's condition, ordering and review of radiographic studies, ordering and review of laboratory studies, obtaining history from patient or surrogate, examination of patient, evaluation of patient's response to treatment, discussions with primary provider, discussions with consultants and development of treatment plan with patient or surrogate   (including critical care time)  Medications Ordered in ED Medications  clindamycin (CLEOCIN) IVPB  600 mg (0 mg Intravenous Stopped 05/20/17 1609)  vancomycin (VANCOCIN) 1,500 mg in sodium chloride 0.9 % 500 mL IVPB (has no administration in time range)  dextrose 5 % and 0.45% NaCl 5-0.45 % bolus 500 mL (500 mLs Intravenous New Bag/Given 05/20/17 1543)  hydrocortisone sodium succinate (SOLU-CORTEF) 100 MG injection 100 mg (has no administration in time range)  acetaminophen (TYLENOL) suppository 650 mg (650 mg Rectal Given 05/20/17 1422)  piperacillin-tazobactam (ZOSYN) IVPB 2.25 g (0 g Intravenous Stopped 05/20/17 1618)     Initial Impression / Assessment and Plan / ED Course  I have reviewed the triage vital signs and the nursing notes.  Pertinent labs & imaging results that were available during my care of the patient were reviewed by me and considered in my medical decision making (see chart for details).     84 year old male with a h/o of ESRD on HD (MWF), A fib. Not on anticoagulation, COPD,  bladder CA, AS s/p AVR, PVD, DM Type II, and HOH, lower GI bleed from [facility] by EMS who presents to the emergency department with tachycardia, tachypnea, and fatigue.  He is critically ill. He is a dialysis patient and refused transport to [facility] with EMS. The patient was seen and evaluated with Dr. Dayna Barker, attending physician.  Rectal temp. 103.3. Initial lactate 2.05. A. Fib with rate in the 100s initially.  Leukocytosis of 19.  Hemoglobin is 9.6,  possibly hemoconcentrated as the patient appears dried. Hemoccult positive.  Code sepsis initiated.  Gentamicin, Zosyn, and vancomycin initiated per pharmacy in the ED.  Blood pressure has been downtrending from 128/59 on arrival to 93/45.  Map remains at 54.  Glu 67. 500 cc bolus of half-normal saline and D5.  Portable CXR with Interstitial markings bilaterally, greater on the right, mild cardiomegaly, and engorged pulmonary vasculature.  Noncontrasted CT C/A/P is pending. Ddx include septic PE, HCAP, aspiration pneumonia. Patient care transferred to PA Caccavale at the end of my shift. Patient presentation, ED course, and plan of care discussed with review of all pertinent labs and imaging. Please see his/her note for further details regarding further ED course and disposition.  Final Clinical Impressions(s) / ED Diagnoses   Final diagnoses:  Sepsis, due to unspecified organism [facility])    ED Discharge Orders    None       Joanne Gavel, PA-C 05/20/17 1622    Mesner, Corene Cornea, MD 05/20/17 1825

## 2017-05-20 NOTE — ED Triage Notes (Signed)
Per EMS: Pt is coming from [facility]. Pt is suspected septic per EMS reports with high HR, lethargic, and high respiratory rate.  Pt has been vomiting with blood in his emesis. Pt has a hx of GI bleed.  Pt is on dialysis and last had dialysis yesterday

## 2017-05-20 NOTE — ED Notes (Signed)
Unable to obtain 2nd set of cultures before antibiotics

## 2017-05-20 NOTE — ED Provider Notes (Signed)
Patient signed out to me by Donavan Foil, PA-C.  Please see previous notes for full history.  Patient presenting today with fatigue yesterday, and family noticed blood on his shirt around 1130 this morning.  Concern for hematemesis versus hemoptysis.  Significant medical history including A. fib for which she is not on anticoagulation, recurrent pneumonia, recent GI bleed, and end-stage renal disease on hemodialysis (M/W/F).  Patient was febrile and hypoxic on arrival.  Code sepsis was called.  Clindamycin, Zosyn, vancomycin started.  Patient slightly hypoglycemic, started on D5 and half-normal.  Chest, abdomen, pelvis CT pending. Family is aware pt will require admission at [facility].  Patient is stable at this time. Family and pt reiterate that pt is full code.  BP trending down, map around 60.  Fluid bolus just began, will watch closely.  Case discussed with attending, Dr. Ellender Hose recommended hydrocortisone bolus. Will call intensivist for admission.  Lactate improved. Sepsis reevaluation completed.  CT shows pleural effusion with adjacent infiltrate.  Discussed with Dr. Sabra Heck from PCCM. Pt to be admitted at [facility]. Will consult with nephrology so they are aware pt is to be admitted and will need dialysis.   Discussed with Dr. Joelyn Oms, who will follow the pt while in the hospital for dialysis.       Franchot Heidelberg, PA-C 05/21/17 Roderic Palau    Duffy Bruce, MD 05/21/17 1212

## 2017-05-20 NOTE — ED Notes (Signed)
Bed: WA25 Expected date:  Expected time:  Means of arrival:  Comments: 

## 2017-05-20 NOTE — Progress Notes (Addendum)
Pharmacy Antibiotic Note  Ronald [name] is a 84 y.o. male admitted on 05/20/2017 with pneumonia, suspected aspiration of hematemesis vs hemoptysis.  Pharmacy has been consulted for Vancomycin and Zosyn dosing.  Today, 05/20/2017: Tm 103.3 WBC 18.9 SCr 4.  Most recent hemodialysis session at [facility] on 05/19/17; next due on 3/27. Lactic acid: 2.05 > 1.43  Plan:  Zosyn 2.25 g IV STAT, then Zosyn 3.375g IV Q12H infused over 4hrs.  Vancomycin 1500mg  IV x1 dose; further vanc doses based on levels, or re-dose after dialysis.  Measure Vanc peak, trough at steady state.  Goal AUC 400-500.  Follow up renal fxn, culture results, and clinical course.      Temp (24hrs), Avg:103.3 F (39.6 C), Min:103.3 F (39.6 C), Max:103.3 F (39.6 C)  Recent Labs  Lab 05/20/17 1352 05/20/17 1359  CREATININE 3.94* 4.00*  LATICACIDVEN  --  2.05*    Estimated Creatinine Clearance: 11.3 mL/min (A) (by C-G formula based on SCr of 4 mg/dL (H)).    Allergies  Allergen Reactions  . Aspirin Anaphylaxis, Shortness Of Breath, Rash and Other (See Comments)    "broke out in white welts; red blotches neck and face; windpipe closed; ~ 1962"  . Tape Other (See Comments)    Patient's skin is VERY THIN and tears and bruises easily!!    Antimicrobials this admission: 3/26 Zosyn >>  3/26 Vancomycin >>  3/26 Clindamycin >>3/26  Dose adjustments this admission:   Microbiology results: 3/26 BCx:  Thank you for allowing pharmacy to be a part of this patient's care.  Gretta Arab PharmD, BCPS Pager [phone] 05/20/2017 3:13 PM

## 2017-05-20 NOTE — ED Provider Notes (Signed)
Medical screening examination/treatment/procedure(s) were conducted as a shared visit with non-physician practitioner(s) and myself.  I personally evaluated the patient during the encounter.  84 year old male with multiple medical problems who presents to the emergency department today secondary to vomiting blood.  Patient is a dialysis patient but refused transport to come in.  Apparently he was lethargic yesterday and got dialysis which did not make him much better.  He also had decreased p.o. intake recently.  With EMS his blood pressure was low but brought here for further evaluation per the family patient request. Evaluation patient has dried dark material around his mouth, febrile, tachycardic, tachypneic on nasal cannula oxygen.  He is hard of hearing but when you yell at him he is awake and able answer questions appropriately.  Does not seem to have any pain.  Lungs have crackles in the right not on the left.  She is obviously very sick.  He is able to answer questions and wants to be a full code to include ventilator, his POA is his son.   CRITICAL CARE Performed by: Merrily Pew Total critical care time: 32 minutes Critical care time was exclusive of separately billable procedures and treating other patients. Critical care was necessary to treat or prevent imminent or life-threatening deterioration. Critical care was time spent personally by me on the following activities: development of treatment plan with patient and/or surrogate as well as nursing, discussions with consultants, evaluation of patient's response to treatment, examination of patient, obtaining history from patient or surrogate, ordering and performing treatments and interventions, ordering and review of laboratory studies, ordering and review of radiographic studies, pulse oximetry and re-evaluation of patient's condition.   EKG Interpretation  Date/Time:  Tuesday May 20 2017 13:48:17 EDT Ventricular Rate:  109 PR  Interval:    QRS Duration: 125 QT Interval:  381 QTC Calculation: 514 R Axis:   -73 Text Interpretation:  Atrial fibrillation Nonspecific IVCD with LAD LVH with secondary repolarization abnormality Anterior Q waves, possibly due to LVH No significant change since last tracing Confirmed by Merrily Pew [phone]) on 05/20/2017 2:25:30 PM      Marcelina Mclaurin, Corene Cornea, MD 05/20/17 1825

## 2017-05-20 NOTE — H&P (Signed)
PULMONARY / CRITICAL CARE MEDICINE   Name: Ronald [name] MRN: [id number] DOB: 1922/11/12    ADMISSION DATE:  05/20/2017 CONSULTATION DATE:  3/26  REFERRING MD:  Dr. Myrene Buddy EDP  CHIEF COMPLAINT:  Sepsis  HISTORY OF PRESENT ILLNESS:  Patient is a poor historian and very hard of hearing and difficult to obtain history. Therefore history has been obtained from chart review. 84 year old male with PMH as below, which is significant for smoker since 84 yrs old, ESRD on HD (only for 4-6 weeks) MWF, AF not on AC, COPD, diastolic CHF, DM, recent GI bleed and PVD. He was in his usual state of health until 3/25 when family visited him at [facility] and he was "not feeling well". He received HD and was lethargic before and after. He complained of being very tired and fatigued. Then 3/26 family went to visit him again and noticed blood all over the front of his shirt, thought to be emesis. EMS was called and he was transported to [facility] ED. Upon arrival to ED he was noted to be febrile 103.3,  Tachypneic, and hypotensive. He was started on O2, IVF, and ABX. CT concerning for pneumonia. Based on hypotension and concern for CRRT need he was transferred to [facility] ICU.   PAST MEDICAL HISTORY :  He  has a past medical history of AS (aortic stenosis), Bladder cancer ([place]), Blood transfusion, Cellulitis of left lower extremity, Chronic atrial fibrillation (HCC), Chronic diastolic congestive heart failure ([place]), Claudication in peripheral vascular disease ([place]) (06/17/2011), COPD (chronic obstructive pulmonary disease) ([place]), Depression (wife died 4 years ago.  ), History of stomach ulcers (~ 1951), HTN (hypertension), Neuropathy due to secondary diabetes ([place]), Peripheral vascular disease ([place]) (very poor circulation legs and feet ... stents right and left legs... done in dr j. berry 's office. ), Pneumonia (07/25/11), Recurrent upper respiratory infection (URI), Renal artery stenosis ([place]) (2006), S/P angioplasty with  stent, diamond back rotational athrectomy Prox. Rt. SFA 06/17/2011 (06/17/2011), Shortness of breath, Thrombocytopenia due to drugs, and Type II diabetes mellitus ([place]).  PAST SURGICAL HISTORY: He  has a past surgical history that includes Peripheral arterial stent graft; Aortic valve replacement (01/07/2011); Cardioversion (04/03/2011); Lower extremity angiogram (06/17/2011); Femur IM nail (06/27/2011); Tonsillectomy and adenoidectomy; Cholecystectomy; Cataract extraction w/ intraocular lens  implant, bilateral (~ 2007); Renal artery stent (2006); Cardiac catheterization (11/19/10); Femur IM nail (Left, 12/14/2013); atherectomy (N/A, 06/17/2011); lower extremity angiogram (Bilateral, 06/17/2011); abdominal angiogram (06/17/2011); Reverse shoulder arthroplasty (Left, 12/31/2016); Fracture surgery; IR Fluoro Guide CV Line Left (04/17/2017); IR US Guide Vasc Access Left (04/17/2017); AV fistula placement (Left, 04/24/2017); and Esophagogastroduodenoscopy (N/A, 05/08/2017).  Allergies  Allergen Reactions  . Aspirin Anaphylaxis, Shortness Of Breath, Rash and Other (See Comments)    "broke out in white welts; red blotches neck and face; windpipe closed; ~ 1962"  . Tape Other (See Comments)    Patient's skin is VERY THIN and tears and bruises easily!!    No current facility-administered medications on file prior to encounter.    Current Outpatient Medications on File Prior to Encounter  Medication Sig  . acetaminophen (TYLENOL) 325 MG tablet Take 650 mg by mouth every 6 (six) hours as needed ("for pain score 1-4; not to exceed #8 tablets in 24 hours").   Marland Kitchen albuterol (ACCUNEB) 0.63 MG/3ML nebulizer solution Take 3 mLs by nebulization every 6 (six) hours as needed for wheezing or shortness of breath.  Marland Kitchen buPROPion (WELLBUTRIN XL) 150 MG 24 hr tablet Take 150  mg by mouth daily.   . diphenhydrAMINE (BENADRYL) 12.5 MG/5ML elixir Take 5 mLs (12.5 mg total) by mouth every 6 (six) hours as needed for itching. Over the counter  (Patient taking differently: Take 12.5 mg by mouth every 6 (six) hours as needed for itching. )  . doxazosin (CARDURA) 2 MG tablet Take 1 tablet (2 mg total) by mouth at bedtime.  . ferrous sulfate 325 (65 FE) MG EC tablet Take 1 tablet (325 mg total) by mouth 2 (two) times daily. (Patient taking differently: Take 325 mg by mouth 2 (two) times daily with a meal. )  . finasteride (PROSCAR) 5 MG tablet Take 5 mg by mouth at bedtime.   . fluticasone (FLONASE) 50 MCG/ACT nasal spray Place 1 spray into both nostrils 2 (two) times daily.   Marland Kitchen glipiZIDE (GLUCOTROL) 5 MG tablet Take 5 mg by mouth daily before breakfast.  . loratadine (CLARITIN) 10 MG tablet Take 10 mg by mouth daily.  . Melatonin 5 MG TABS Take 5 mg by mouth at bedtime.  . metoprolol succinate (TOPROL-XL) 25 MG 24 hr tablet Take 0.5 tablets (12.5 mg total) by mouth 2 (two) times daily with a meal.  . Multiple Vitamins-Minerals (MULTIVITAMIN WITH MINERALS) tablet Take 1 tablet by mouth at bedtime.  Marland Kitchen neomycin-bacitracin-polymyxin (NEOSPORIN) ointment Apply 1 application topically daily. Apply to left elbow and forearm daily for 10 days. Started 05/14/17.  Marland Kitchen pantoprazole (PROTONIX) 40 MG tablet Take 1 tablet (40 mg total) by mouth daily.  . simvastatin (ZOCOR) 20 MG tablet Take 20 mg by mouth every evening.  . traMADol (ULTRAM) 50 MG tablet Take 1 tablet (50 mg total) by mouth every 12 (twelve) hours as needed for moderate pain.  Marland Kitchen Umeclidinium-Vilanterol (ANORO ELLIPTA) 62.5-25 MCG/INH AEPB Inhale 1 puff into the lungs daily.  . OXYGEN Inhale 2 L into the lungs continuous.    FAMILY HISTORY:  His indicated that his mother is deceased. He indicated that his father is deceased. He indicated that his brother is deceased.   SOCIAL HISTORY: He  reports that he has been smoking cigarettes.  He has a 112.50 pack-year smoking history. He has never used smokeless tobacco. He reports that he does not drink alcohol or use drugs.  REVIEW OF  SYSTEMS:   Unable to fully/accurately obtain as patient is a very poor historian.  SUBJECTIVE:  No present complaints. Arrived to 2M01, CBG 49.    VITAL SIGNS: BP (!) 101/44   Pulse 81   Temp (!) 103.3 F (39.6 C) (Rectal)   Resp (!) 22   SpO2 98%   HEMODYNAMICS:    VENTILATOR SETTINGS:    INTAKE / OUTPUT: No intake/output data recorded.  PHYSICAL EXAMINATION: General:  Elderly male lying in bed in NAD, extremely hard of hearing, hears best on right HEENT: MM pink/moist, pupils 3/reactive, no JVD, questionable dried dark substance on corners of mouth Neuro: Alert, mostly oriented, appropriate, MAE CV: IRIR, 2+ pulses, Left TDC and LUE AVF site wnl PULM: even/non-labored, lungs bilaterally clear throughout  GI: soft, non-tender, bs active  Extremities: warm/dry, no edema  Skin: no rashes, scattered bruising to arms  LABS:  BMET Recent Labs  Lab 05/20/17 1352 05/20/17 1359  NA 137 138  K 4.0 4.0  CL 99* 97*  CO2 25  --   BUN 28* 31*  CREATININE 3.94* 4.00*  GLUCOSE 62* 60*    Electrolytes Recent Labs  Lab 05/20/17 1352  CALCIUM 8.0*    CBC Recent Labs  Lab 05/20/17 1352 05/20/17 1359  WBC 18.9*  --   HGB 9.6* 10.5*  HCT 30.8* 31.0*  PLT 100*  --     Coag's Recent Labs  Lab 05/20/17 1352  INR 1.37    Sepsis Markers Recent Labs  Lab 05/20/17 1359 05/20/17 1622  LATICACIDVEN 2.05* 1.43    ABG No results for input(s): PHART, PCO2ART, PO2ART in the last 168 hours.  Liver Enzymes Recent Labs  Lab 05/20/17 1352  AST 43*  ALT 18  ALKPHOS 79  BILITOT 1.2  ALBUMIN 2.5*    Cardiac Enzymes No results for input(s): TROPONINI, PROBNP in the last 168 hours.  Glucose No results for input(s): GLUCAP in the last 168 hours.  Imaging Dg Chest Portable 1 View  Result Date: 05/20/2017 CLINICAL DATA:  Suspicion of sepsis. EXAM: PORTABLE CHEST 1 VIEW COMPARISON:  Portable chest x-ray of April 19, 2017 FINDINGS: The lungs are  well-expanded. The interstitial markings remain increased bilaterally greatest on the right. The cardiac silhouette remains mildly enlarged. The pulmonary vascularity is engorged. The dual-lumen dialysis catheter tip projects at the cavoatrial junction. The sternal wires and left atrial appendage clip are stable. IMPRESSION: CHF with mild interstitial edema. One cannot exclude basilar pneumonia on the right. Electronically Signed   By: David  Martinique M.D.   On: 05/20/2017 15:05   STUDIES:  CT chest 3/26 > 2.2 cm rounded density is noted in pancreatic body which is slightly enlarged compared to prior exam of 2013 CT abdomen 3/26 > Moderate right pleural effusion is noted with adjacent atelectasis or infiltrate in the right lower lobe. Minimal left pleural effusion is noted with adjacent subsegmental atelectasis.  CULTURES: Blood 3/26 > Urine 3/26 > Sputum 3/26 >  ANTIBIOTICS: Clindamycin 3/26 x 1  Zosyn 3/26 > Vancomycin 3/26 >  SIGNIFICANT EVENTS: 3/25 Admitted   LINES/TUBES: PIV  TDC left chest LUE AVF  DISCUSSION: 13 yoM ESRD pt presenting with SIRS, hypotension concerning for pneumonia and episode of hematemesis at SNF.  Transferred to [facility] for concern for need of CRRT however BP has been stable over the last 12 hours.   ASSESSMENT / PLAN:  PULMONARY A: R/o HCAP, ? RLL infiltrate  Hx COPD, tobacco abuse since 10 yrs of age P:   Supplemental O2 prn > 90-95% See ID  Duonebs prn q 6 hr Continue Anoro daily   CARDIOVASCULAR A:  SIRS, hypotension fluid responsive  Hx PAF not on AC due to recent UGIB, diastolic HF, PVD, HTN - currently normotensive, maintained P:  SDU  Tele monitoring Goal MAP > 60/ renal patient   RENAL A:   ESRD on HD (newly started 2/21, last 3/25) Lactic acidosis - resolved - currently no indications for immediate CRRT P:   Will consult Renal during day shift Renal panel/ mag now Replace electrolytes as indicated Avoid nephrotoxic agents,  ensure adequate renal perfusion  GASTROINTESTINAL A:   hematemesis - no episodes since SNF - normal EGD 3/14 after melena/ ?suspected hematemesis after swallowed blood from epistaxis  P:   Clear liquids as tolerated if mental status permits PPI BID Trend CBC/ monitor for bleeding   HEMATOLOGIC A:   Anemia -  Leukocytosis  P:  CBC and coags now and Trend SCDs only   INFECTIOUS A:   R/o HCAP/ RLL  P:   Continue vanc/ zosyn Narrow as able with culture data Trend WBC/ fever curve  ENDOCRINE A:   DM Hypoglycemia P:   CBG q 4 SSI if  needed Assess cortisol r/o adrenal insufficiency   NEUROLOGIC A:   Acute encephalopathy -likely in the setting of sepsis, now resolved P:   monitor   FAMILY  - Updates: No family at bedside.  Patient reports his son is is HPOA.  Patient appears to have stabilized and remained hemodynamically stable in no respiratory distress since initial consult to transfer to [facility].  Can likely transfer to tele if remains stable throughout the am.     - Inter-disciplinary family meet or Palliative Care meeting due by:  4/3   Kennieth Rad, AGACNP-BC [place] Pgr: [phone] or if no answer [phone] 05/21/2017, 2:45 AM

## 2017-05-21 ENCOUNTER — Other Ambulatory Visit: Payer: Self-pay

## 2017-05-21 DIAGNOSIS — J309 Allergic rhinitis, unspecified: Secondary | ICD-10-CM | POA: Diagnosis not present

## 2017-05-21 DIAGNOSIS — J90 Pleural effusion, not elsewhere classified: Secondary | ICD-10-CM

## 2017-05-21 DIAGNOSIS — Z66 Do not resuscitate: Secondary | ICD-10-CM | POA: Diagnosis not present

## 2017-05-21 DIAGNOSIS — G9349 Other encephalopathy: Secondary | ICD-10-CM | POA: Diagnosis not present

## 2017-05-21 DIAGNOSIS — G2581 Restless legs syndrome: Secondary | ICD-10-CM | POA: Diagnosis not present

## 2017-05-21 DIAGNOSIS — F329 Major depressive disorder, single episode, unspecified: Secondary | ICD-10-CM | POA: Diagnosis not present

## 2017-05-21 DIAGNOSIS — R040 Epistaxis: Secondary | ICD-10-CM

## 2017-05-21 DIAGNOSIS — I482 Chronic atrial fibrillation: Secondary | ICD-10-CM | POA: Diagnosis not present

## 2017-05-21 DIAGNOSIS — T80211A Bloodstream infection due to central venous catheter, initial encounter: Secondary | ICD-10-CM | POA: Diagnosis not present

## 2017-05-21 DIAGNOSIS — I5032 Chronic diastolic (congestive) heart failure: Secondary | ICD-10-CM | POA: Diagnosis not present

## 2017-05-21 DIAGNOSIS — K8689 Other specified diseases of pancreas: Secondary | ICD-10-CM

## 2017-05-21 DIAGNOSIS — N186 End stage renal disease: Secondary | ICD-10-CM

## 2017-05-21 DIAGNOSIS — D699 Hemorrhagic condition, unspecified: Secondary | ICD-10-CM

## 2017-05-21 DIAGNOSIS — E43 Unspecified severe protein-calorie malnutrition: Secondary | ICD-10-CM

## 2017-05-21 DIAGNOSIS — R64 Cachexia: Secondary | ICD-10-CM | POA: Diagnosis not present

## 2017-05-21 DIAGNOSIS — Z992 Dependence on renal dialysis: Secondary | ICD-10-CM

## 2017-05-21 DIAGNOSIS — Z681 Body mass index (BMI) 19 or less, adult: Secondary | ICD-10-CM | POA: Diagnosis not present

## 2017-05-21 DIAGNOSIS — R7881 Bacteremia: Secondary | ICD-10-CM

## 2017-05-21 DIAGNOSIS — I872 Venous insufficiency (chronic) (peripheral): Secondary | ICD-10-CM | POA: Diagnosis not present

## 2017-05-21 DIAGNOSIS — A4101 Sepsis due to Methicillin susceptible Staphylococcus aureus: Secondary | ICD-10-CM | POA: Diagnosis not present

## 2017-05-21 DIAGNOSIS — Z95828 Presence of other vascular implants and grafts: Secondary | ICD-10-CM

## 2017-05-21 DIAGNOSIS — B9561 Methicillin susceptible Staphylococcus aureus infection as the cause of diseases classified elsewhere: Secondary | ICD-10-CM

## 2017-05-21 DIAGNOSIS — E1122 Type 2 diabetes mellitus with diabetic chronic kidney disease: Secondary | ICD-10-CM | POA: Diagnosis not present

## 2017-05-21 DIAGNOSIS — K862 Cyst of pancreas: Secondary | ICD-10-CM | POA: Diagnosis not present

## 2017-05-21 DIAGNOSIS — Z952 Presence of prosthetic heart valve: Secondary | ICD-10-CM

## 2017-05-21 DIAGNOSIS — Z886 Allergy status to analgesic agent status: Secondary | ICD-10-CM

## 2017-05-21 DIAGNOSIS — J449 Chronic obstructive pulmonary disease, unspecified: Secondary | ICD-10-CM | POA: Diagnosis not present

## 2017-05-21 DIAGNOSIS — J189 Pneumonia, unspecified organism: Secondary | ICD-10-CM | POA: Diagnosis not present

## 2017-05-21 DIAGNOSIS — E1151 Type 2 diabetes mellitus with diabetic peripheral angiopathy without gangrene: Secondary | ICD-10-CM | POA: Diagnosis not present

## 2017-05-21 DIAGNOSIS — F17210 Nicotine dependence, cigarettes, uncomplicated: Secondary | ICD-10-CM

## 2017-05-21 DIAGNOSIS — R042 Hemoptysis: Secondary | ICD-10-CM | POA: Diagnosis not present

## 2017-05-21 DIAGNOSIS — R6521 Severe sepsis with septic shock: Secondary | ICD-10-CM | POA: Diagnosis not present

## 2017-05-21 DIAGNOSIS — I132 Hypertensive heart and chronic kidney disease with heart failure and with stage 5 chronic kidney disease, or end stage renal disease: Secondary | ICD-10-CM | POA: Diagnosis not present

## 2017-05-21 DIAGNOSIS — E1140 Type 2 diabetes mellitus with diabetic neuropathy, unspecified: Secondary | ICD-10-CM | POA: Diagnosis not present

## 2017-05-21 DIAGNOSIS — D649 Anemia, unspecified: Secondary | ICD-10-CM

## 2017-05-21 DIAGNOSIS — Z91048 Other nonmedicinal substance allergy status: Secondary | ICD-10-CM

## 2017-05-21 DIAGNOSIS — J9811 Atelectasis: Secondary | ICD-10-CM | POA: Diagnosis not present

## 2017-05-21 LAB — BASIC METABOLIC PANEL
Anion gap: 14 (ref 5–15)
BUN: 37 mg/dL — ABNORMAL HIGH (ref 6–20)
CALCIUM: 7.5 mg/dL — AB (ref 8.9–10.3)
CO2: 23 mmol/L (ref 22–32)
CREATININE: 4.42 mg/dL — AB (ref 0.61–1.24)
Chloride: 99 mmol/L — ABNORMAL LOW (ref 101–111)
GFR calc Af Amer: 12 mL/min — ABNORMAL LOW (ref >60)
GFR, EST NON AFRICAN AMERICAN: 10 mL/min — AB (ref >60)
GLUCOSE: 144 mg/dL — AB (ref 65–99)
Potassium: 3.9 mmol/L (ref 3.5–5.1)
Sodium: 136 mmol/L (ref 135–145)

## 2017-05-21 LAB — CORTISOL: Cortisol, Plasma: 53.6 ug/dL

## 2017-05-21 LAB — BLOOD CULTURE ID PANEL (REFLEXED)
Acinetobacter baumannii: NOT DETECTED
CANDIDA GLABRATA: NOT DETECTED
CANDIDA KRUSEI: NOT DETECTED
CANDIDA PARAPSILOSIS: NOT DETECTED
CANDIDA TROPICALIS: NOT DETECTED
Candida albicans: NOT DETECTED
ENTEROBACTER CLOACAE COMPLEX: NOT DETECTED
ESCHERICHIA COLI: NOT DETECTED
Enterobacteriaceae species: NOT DETECTED
Enterococcus species: NOT DETECTED
Haemophilus influenzae: NOT DETECTED
KLEBSIELLA OXYTOCA: NOT DETECTED
KLEBSIELLA PNEUMONIAE: NOT DETECTED
Listeria monocytogenes: NOT DETECTED
Methicillin resistance: NOT DETECTED
NEISSERIA MENINGITIDIS: NOT DETECTED
Proteus species: NOT DETECTED
Pseudomonas aeruginosa: NOT DETECTED
STREPTOCOCCUS PYOGENES: NOT DETECTED
STREPTOCOCCUS SPECIES: NOT DETECTED
Serratia marcescens: NOT DETECTED
Staphylococcus aureus (BCID): DETECTED — AB
Staphylococcus species: DETECTED — AB
Streptococcus agalactiae: NOT DETECTED
Streptococcus pneumoniae: NOT DETECTED

## 2017-05-21 LAB — GLUCOSE, CAPILLARY
Glucose-Capillary: 145 mg/dL — ABNORMAL HIGH (ref 65–99)
Glucose-Capillary: 49 mg/dL — ABNORMAL LOW (ref 65–99)
Glucose-Capillary: 147 mg/dL — ABNORMAL HIGH (ref 65–99)
Glucose-Capillary: 161 mg/dL — ABNORMAL HIGH (ref 65–99)

## 2017-05-21 LAB — CBC
HEMATOCRIT: 27 % — AB (ref 39.0–52.0)
HEMATOCRIT: 30.8 % — AB (ref 39.0–52.0)
HEMOGLOBIN: 8.4 g/dL — AB (ref 13.0–17.0)
Hemoglobin: 9.6 g/dL — ABNORMAL LOW (ref 13.0–17.0)
MCH: 30.1 pg (ref 26.0–34.0)
MCH: 30 pg (ref 26.0–34.0)
MCHC: 31.1 g/dL (ref 30.0–36.0)
MCHC: 31.2 g/dL (ref 30.0–36.0)
MCV: 96.8 fL (ref 78.0–100.0)
MCV: 96.3 fL (ref 78.0–100.0)
Platelets: 70 10*3/uL — ABNORMAL LOW (ref 150–400)
Platelets: 74 10*3/uL — ABNORMAL LOW (ref 150–400)
RBC: 2.79 MIL/uL — AB (ref 4.22–5.81)
RBC: 3.2 MIL/uL — AB (ref 4.22–5.81)
RDW: 19.2 % — ABNORMAL HIGH (ref 11.5–15.5)
RDW: 19 % — AB (ref 11.5–15.5)
WBC: 13.9 10*3/uL — AB (ref 4.0–10.5)
WBC: 20.1 10*3/uL — AB (ref 4.0–10.5)

## 2017-05-21 LAB — PROTIME-INR
INR: 1.47
Prothrombin Time: 17.7 seconds — ABNORMAL HIGH (ref 11.4–15.2)

## 2017-05-21 LAB — PHOSPHORUS: Phosphorus: 5.7 mg/dL — ABNORMAL HIGH (ref 2.5–4.6)

## 2017-05-21 LAB — MAGNESIUM: Magnesium: 1.9 mg/dL (ref 1.7–2.4)

## 2017-05-21 LAB — HEPATITIS B SURFACE ANTIGEN: Hepatitis B Surface Ag: NEGATIVE

## 2017-05-21 MED ORDER — UMECLIDINIUM-VILANTEROL 62.5-25 MCG/INH IN AEPB
1.0000 | INHALATION_SPRAY | Freq: Every day | RESPIRATORY_TRACT | Status: DC
  Administered 2017-05-21 – 2017-05-26 (×6): 1 via RESPIRATORY_TRACT
  Filled 2017-05-21: qty 14

## 2017-05-21 MED ORDER — DEXTROSE 50 % IV SOLN
25.0000 mL | Freq: Once | INTRAVENOUS | Status: AC
  Administered 2017-05-21: 25 mL via INTRAVENOUS

## 2017-05-21 MED ORDER — CEFAZOLIN SODIUM-DEXTROSE 1-4 GM/50ML-% IV SOLN
1.0000 g | INTRAVENOUS | Status: DC
  Administered 2017-05-21 – 2017-05-25 (×5): 1 g via INTRAVENOUS
  Filled 2017-05-21 (×7): qty 50

## 2017-05-21 MED ORDER — LIDOCAINE-PRILOCAINE 2.5-2.5 % EX CREA
1.0000 "application " | TOPICAL_CREAM | CUTANEOUS | Status: DC | PRN
  Filled 2017-05-21: qty 5

## 2017-05-21 MED ORDER — PANTOPRAZOLE SODIUM 40 MG PO TBEC
40.0000 mg | DELAYED_RELEASE_TABLET | Freq: Every day | ORAL | Status: DC
  Administered 2017-05-21 – 2017-05-26 (×6): 40 mg via ORAL
  Filled 2017-05-21 (×6): qty 1

## 2017-05-21 MED ORDER — DARBEPOETIN ALFA 200 MCG/0.4ML IJ SOSY: 200.0000 ug | PREFILLED_SYRINGE | INTRAMUSCULAR | Status: DC

## 2017-05-21 MED ORDER — ALTEPLASE 2 MG IJ SOLR
2.0000 mg | Freq: Once | INTRAMUSCULAR | Status: DC | PRN
  Filled 2017-05-21: qty 2

## 2017-05-21 MED ORDER — IPRATROPIUM-ALBUTEROL 0.5-2.5 (3) MG/3ML IN SOLN: 3.0000 mL | Freq: Four times a day (QID) | RESPIRATORY_TRACT | Status: DC | PRN

## 2017-05-21 MED ORDER — HEPARIN SODIUM (PORCINE) 1000 UNIT/ML DIALYSIS: 1000.0000 [IU] | INTRAMUSCULAR | Status: DC | PRN

## 2017-05-21 MED ORDER — PENTAFLUOROPROP-TETRAFLUOROETH EX AERO: 1.0000 "application " | INHALATION_SPRAY | CUTANEOUS | Status: DC | PRN

## 2017-05-21 MED ORDER — LIDOCAINE HCL (PF) 1 % IJ SOLN: 5.0000 mL | INTRAMUSCULAR | Status: DC | PRN

## 2017-05-21 MED ORDER — PANTOPRAZOLE SODIUM 40 MG IV SOLR: 40.0000 mg | Freq: Every day | INTRAVENOUS | Status: DC

## 2017-05-21 MED ORDER — NEPRO/CARBSTEADY PO LIQD
237.0000 mL | Freq: Three times a day (TID) | ORAL | Status: DC
  Administered 2017-05-21 – 2017-05-26 (×7): 237 mL via ORAL
  Filled 2017-05-21 (×20): qty 237

## 2017-05-21 MED ORDER — SODIUM CHLORIDE 0.9 % IV SOLN: 100.0000 mL | INTRAVENOUS | Status: DC | PRN

## 2017-05-21 MED ORDER — DEXTROSE 50 % IV SOLN
INTRAVENOUS | Status: AC
  Filled 2017-05-21: qty 50

## 2017-05-21 MED ORDER — SODIUM CHLORIDE 0.9 % IV BOLUS
250.0000 mL | Freq: Once | INTRAVENOUS | Status: AC
  Administered 2017-05-21: 250 mL via INTRAVENOUS

## 2017-05-21 NOTE — Consult Note (Signed)
[facility] Nurse wound consult note Reason for Consult: intergluteal redness from MASD Wound type:  MASD intertriginous and IAD Pressure Injury POA: NA Measurement:4cm x 4cm area of MASD between buttocks Wound bed: na Drainage (amount, consistency, odor) none Periwound:intact Dressing procedure/placement/frequency: I have provided nurses with orders for placing foam dressing with a bend (like taco) to place in intergluteal cleft, not across it which holds skin on skin. Also to use PURPLE TOP barrier ointment twice daily and prn incontinence episodes. We will not follow, but will remain available to this patient, to nursing, and the medical and/or surgical teams.  Please re-consult if we need to assist further.  Fara Olden, RN-C, WTA-C, [place] Wound Treatment Associate Ostomy Care Associate

## 2017-05-21 NOTE — Consult Note (Signed)
[facility] for Infectious Disease    Date of Admission:  05/20/2017           Day 1 piperacillin tazobactam       Reason for Consult: Automatic consultation for MSSA bacteremia     Assessment: He has developed MSSA bacteremia possibly related to his hemodialysis catheter or recent AV graft surgery.  I do not believe that he has pneumonia.  I will narrow antibiotic therapy to cefazolin, order transthoracic echocardiogram and plan on repeat blood cultures in the morning.  Plan: 1. Change piperacillin tazobactam to cefazolin 2. Transthoracic echocardiogram 3. Repeat blood cultures in a.m.  Principal Problem:   Bacteremia due to methicillin susceptible Staphylococcus aureus (MSSA) Active Problems:   Sepsis ([place])   S/P aortic valve replacement: #23 Magna Ease Edwards Pericardial Valve  November 2012   Scheduled Meds: . [START ON 05/28/2017] darbepoetin (ARANESP) injection - DIALYSIS  200 mcg Intravenous Q Wed-HD  . feeding supplement (NEPRO CARB STEADY)  237 mL Oral TID BM  . pantoprazole  40 mg Oral Q0600  . umeclidinium-vilanterol  1 puff Inhalation Daily   Continuous Infusions: . sodium chloride    . sodium chloride    .  ceFAZolin (ANCEF) IV     PRN Meds:.sodium chloride, sodium chloride, alteplase, heparin, ipratropium-albuterol, lidocaine (PF), lidocaine-prilocaine, pentafluoroprop-tetrafluoroeth  HPI: Ronald [name] is a 84 y.o. male with end-stage renal disease who recently started hemodialysis via a left IJ hemodialysis catheter.  He also had a left upper arm graft placed.  He previously had aortic valve replacement with a pericardial valve in 2012.  He was found confused yesterday with blood on his shirt and there was concern for a recurrent GI bleed leading to admission.  Blood cultures were obtained and both are already growing MSSA.  Chest x-ray suggested possibility of pneumonia but a CT scan shows no infiltrates, a moderate size right pleural effusion and  a slightly enlarged pancreatic mass.   Review of Systems: Review of Systems  Unable to perform ROS: Mental acuity    Past Medical History:  Diagnosis Date  . AS (aortic stenosis)    bovine aortic valve replacement 01/07/11  . Bladder cancer [facility])    Bladder Cancer local  . Blood transfusion    w/hip operation  . Cellulitis of left lower extremity   . Chronic atrial fibrillation ([place])   . Chronic diastolic congestive heart failure ([place])   . Claudication in peripheral vascular disease ([place]) 06/17/2011  . COPD (chronic obstructive pulmonary disease) ([place])   . Depression wife died 4 years ago.    Marland Kitchen History of stomach ulcers ~ 1951  . HTN (hypertension)   . Neuropathy due to secondary diabetes ([place])   . Peripheral vascular disease ([place]) very poor circulation legs and feet ... stents right and left legs... done in dr j. Gwenlyn Found 's office.   . Pneumonia 07/25/11   left  . Recurrent upper respiratory infection (URI)    sinusitis  . Renal artery stenosis ([place]) 2006   renal artery stent  . S/P angioplasty with stent, diamond back rotational athrectomy Prox. Rt. SFA 06/17/2011 06/17/2011  . Shortness of breath   . Thrombocytopenia due to drugs    seen by Dr Inda Merlin plts [id number] no rx  . Type II diabetes mellitus (HCC)     Social History   Tobacco Use  . Smoking status: Current Every Day Smoker    Packs/day: 1.50  Years: 75.00    Pack years: 112.50    Types: Cigarettes  . Smokeless tobacco: Never Used  Substance Use Topics  . Alcohol use: No    Alcohol/week: 1.2 oz    Types: 2 Cans of beer per week  . Drug use: No    Family History  Problem Relation Age of Onset  . Heart disease Mother   . Cancer Brother        colon  . Stroke Father    Allergies  Allergen Reactions  . Aspirin Anaphylaxis, Shortness Of Breath, Rash and Other (See Comments)    "broke out in white welts; red blotches neck and face; windpipe closed; ~ 1962"  . Tape Other (See Comments)    Patient's  skin is VERY THIN and tears and bruises easily!!    OBJECTIVE: Blood pressure (!) 117/50, pulse 94, temperature (!) 97.4 F (36.3 C), temperature source Oral, resp. rate (!) 30, weight 154 lb 15.7 oz (70.3 kg), SpO2 98 %.  Physical Exam  Constitutional:  He is resting quietly in bed.  He is alert but confused.  HENT:  Mouth/Throat: No oropharyngeal exudate.  Eyes: Conjunctivae are normal.  Neck: Neck supple.  Cardiovascular: Normal rate and regular rhythm.  No murmur heard. Distant heart sounds.  Pulmonary/Chest: Effort normal. He has no wheezes. He has no rales.  Frequent loose cough.  Left IJ hemodialysis catheter appears normal.  Abdominal: Soft. He exhibits no distension. There is no tenderness.  Musculoskeletal:  Recent left upper arm AV graft placement.  Incisions look good.  No unusual swelling, redness or warmth.  Neurological: He is alert.  Skin: No rash noted.  Skin tear on left forearm.    Lab Results Lab Results  Component Value Date   WBC 13.9 (H) 05/21/2017   HGB 8.4 (L) 05/21/2017   HCT 27.0 (L) 05/21/2017   MCV 96.8 05/21/2017   PLT 70 (L) 05/21/2017    Lab Results  Component Value Date   CREATININE 4.42 (H) 05/21/2017   BUN 37 (H) 05/21/2017   NA 136 05/21/2017   K 3.9 05/21/2017   CL 99 (L) 05/21/2017   CO2 23 05/21/2017    Lab Results  Component Value Date   ALT 18 05/20/2017   AST 43 (H) 05/20/2017   ALKPHOS 79 05/20/2017   BILITOT 1.2 05/20/2017     Microbiology: Recent Results (from the past 240 hour(s))  Culture, blood (Routine x 2)     Status: None (Preliminary result)   Collection Time: 05/20/17  1:36 PM  Result Value Ref Range Status   Specimen Description   Final    BLOOD LEFT HAND Performed at [facility], [place] [address], [place], [place] [address]    Special Requests   Final    BOTTLES DRAWN AEROBIC AND ANAEROBIC Blood Culture adequate volume Performed at [facility] [address], [place], [place] [address]    Culture  Setup Time   Final    GRAM POSITIVE COCCI AEROBIC BOTTLE ONLY Organism ID to follow CRITICAL RESULT CALLED TO, READ BACK BY AND VERIFIED WITH: L SEAY PHARMD 05/21/17 0427 JDW    Culture   Final    NO GROWTH < 24 HOURS Performed at [facility], [place] [address] [place], [place] [address]    Report Status PENDING  Incomplete  Blood Culture ID Panel (Reflexed)     Status: Abnormal   Collection Time: 05/20/17  1:36 PM  Result Value  Ref Range Status   Enterococcus species NOT DETECTED NOT DETECTED Final   Listeria monocytogenes NOT DETECTED NOT DETECTED Final   Staphylococcus species DETECTED (A) NOT DETECTED Final    Comment: CRITICAL RESULT CALLED TO, READ BACK BY AND VERIFIED WITH: L SEAY PHARMD 05/21/17 0427 JDW    Staphylococcus aureus DETECTED (A) NOT DETECTED Final    Comment: Methicillin (oxacillin) susceptible Staphylococcus aureus (MSSA). Preferred therapy is anti staphylococcal beta lactam antibiotic (Cefazolin or Nafcillin), unless clinically contraindicated. CRITICAL RESULT CALLED TO, READ BACK BY AND VERIFIED WITH: L SEAY PHARMD 05/21/17 0427 JDW    Methicillin resistance NOT DETECTED NOT DETECTED Final   Streptococcus species NOT DETECTED NOT DETECTED Final   Streptococcus agalactiae NOT DETECTED NOT DETECTED Final   Streptococcus pneumoniae NOT DETECTED NOT DETECTED Final   Streptococcus pyogenes NOT DETECTED NOT DETECTED Final   Acinetobacter baumannii NOT DETECTED NOT DETECTED Final   Enterobacteriaceae species NOT DETECTED NOT DETECTED Final   Enterobacter cloacae complex NOT DETECTED NOT DETECTED Final   Escherichia coli NOT DETECTED NOT DETECTED Final   Klebsiella oxytoca NOT DETECTED NOT DETECTED Final   Klebsiella pneumoniae NOT DETECTED NOT DETECTED Final   Proteus species NOT DETECTED NOT DETECTED Final   Serratia marcescens NOT DETECTED NOT DETECTED Final   Haemophilus influenzae NOT DETECTED NOT DETECTED Final    Neisseria meningitidis NOT DETECTED NOT DETECTED Final   Pseudomonas aeruginosa NOT DETECTED NOT DETECTED Final   Candida albicans NOT DETECTED NOT DETECTED Final   Candida glabrata NOT DETECTED NOT DETECTED Final   Candida krusei NOT DETECTED NOT DETECTED Final   Candida parapsilosis NOT DETECTED NOT DETECTED Final   Candida tropicalis NOT DETECTED NOT DETECTED Final    Comment: Performed at [facility], [place]. [address], [place], [place] [address]  Culture, blood (Routine x 2)     Status: None (Preliminary result)   Collection Time: 05/20/17  1:50 PM  Result Value Ref Range Status   Specimen Description   Final    BLOOD RIGHT ANTECUBITAL Performed at [facility] [address], [place], [place] [address]    Special Requests   Final    BOTTLES DRAWN AEROBIC AND ANAEROBIC Blood Culture adequate volume Performed at [facility] [address], [place], [place] [address]    Culture  Setup Time   Final    GRAM POSITIVE COCCI ANAEROBIC BOTTLE ONLY CRITICAL RESULT CALLED TO, READ BACK BY AND VERIFIED WITH: L SEAY PHARMD 05/21/17 0427 JDW    Culture   Final    NO GROWTH < 24 HOURS Performed at [facility], [place] [address], [place],  [address]    Report Status PENDING  Incomplete    Michel Bickers, MD [facility] [place] [facility] [phone] pager   [phone] cell 05/21/2017, 12:43 PM

## 2017-05-21 NOTE — Progress Notes (Addendum)
Initial Nutrition Assessment  DOCUMENTATION CODES:   Severe malnutrition in context of chronic illness  INTERVENTION:    Nepro Shake po TID, each supplement provides 425 kcal and 19 grams protein  Recommend adding renal multivitamin, MD, please order  NUTRITION DIAGNOSIS:   Severe Malnutrition related to chronic illness(ESRD, HF, COPD) as evidenced by severe muscle depletion, percent weight loss(14% weight loss within 4-5 months).  GOAL:   Patient will meet greater than or equal to 90% of their needs  MONITOR:   PO intake, Supplement acceptance, Skin, I & O's, Labs  REASON FOR ASSESSMENT:   Malnutrition Screening Tool    ASSESSMENT:   84 yo male with PMH of aortic stenosis, HTN, PVD, depression, COPD, claudication, smoker since age 39, neuropathy, DM-2, renal artery stenosis, bladder cancer, CHF, ESRD (has been on HD x 5 weeks) who was admitted on 3/26 with SIRS, hypotension, concern for pneumonia, hematemesis.   Patient reports poor intake for the past few weeks related to poor appetite. He has lost weight, but unsure of amount.   Per review of usual weights, patient has lost 14% of usual weight in the past 3-4 months, which is significant for the time frame.   Orders in place to move to telemetry / renal floor. Diet just advanced to renal / CHO modified. Expect intake will be poor.  Labs reviewed. Phosphorus 5.7 (H) CBG's: [phone] Medications reviewed.  NUTRITION - FOCUSED PHYSICAL EXAM:    Most Recent Value  Orbital Region  Mild depletion  Upper Arm Region  Mild depletion  Thoracic and Lumbar Region  Mild depletion  Buccal Region  Mild depletion  Temple Region  Mild depletion  Clavicle Bone Region  Moderate depletion  Clavicle and Acromion Bone Region  Moderate depletion  Scapular Bone Region  Moderate depletion  Dorsal Hand  Mild depletion  Patellar Region  Moderate depletion  Anterior Thigh Region  Severe depletion  Posterior Calf Region  Severe  depletion  Edema (RD Assessment)  None  Hair  Reviewed  Eyes  Reviewed  Mouth  Reviewed  Skin  Reviewed  Nails  Reviewed       Diet Order:  Diet renal/carb modified with fluid restriction Fluid restriction: 1200 mL Fluid; Room service appropriate? Yes; Fluid consistency: Thin  EDUCATION NEEDS:   Not appropriate for education at this time  Skin:  Skin Assessment: Skin Integrity Issues: Skin Integrity Issues:: Other (Comment) Other: MASD between buttocks  Last BM:  3/26  Height:   Ht Readings from Last 1 Encounters:  05/06/17 5\' 11"  (1.803 m)    Weight:   Wt Readings from Last 1 Encounters:  05/21/17 154 lb 15.7 oz (70.3 kg)    Ideal Body Weight:  78.2 kg  BMI:  Body mass index is 21.62 kg/m.  Estimated Nutritional Needs:   Kcal:  2000-2200  Protein:  90-110 gm  Fluid:  1-1.2 L     Ronald [name], RD, LDN, [place] Pager [phone] After Hours Pager [phone]

## 2017-05-21 NOTE — Progress Notes (Signed)
PHARMACY - PHYSICIAN COMMUNICATION CRITICAL VALUE ALERT - BLOOD CULTURE IDENTIFICATION (BCID)  Ronald VANOVERBEKE is an 84 y.o. male who presented to [facility] on 05/20/2017 with a chief complaint of fatigue  Assessment:  On vancomycin and zosyn for suspected sepsis  Name of physician (or Provider) Contacted: n/a  Changes to prescribed antibiotics recommended:  Consider changing to ancef if clinically warranted.  Results for orders placed or performed during the hospital encounter of 05/20/17  Blood Culture ID Panel (Reflexed) (Collected: 05/20/2017  1:36 PM)  Result Value Ref Range   Enterococcus species NOT DETECTED NOT DETECTED   Listeria monocytogenes NOT DETECTED NOT DETECTED   Staphylococcus species DETECTED (A) NOT DETECTED   Staphylococcus aureus DETECTED (A) NOT DETECTED   Methicillin resistance NOT DETECTED NOT DETECTED   Streptococcus species NOT DETECTED NOT DETECTED   Streptococcus agalactiae NOT DETECTED NOT DETECTED   Streptococcus pneumoniae NOT DETECTED NOT DETECTED   Streptococcus pyogenes NOT DETECTED NOT DETECTED   Acinetobacter baumannii NOT DETECTED NOT DETECTED   Enterobacteriaceae species NOT DETECTED NOT DETECTED   Enterobacter cloacae complex NOT DETECTED NOT DETECTED   Escherichia coli NOT DETECTED NOT DETECTED   Klebsiella oxytoca NOT DETECTED NOT DETECTED   Klebsiella pneumoniae NOT DETECTED NOT DETECTED   Proteus species NOT DETECTED NOT DETECTED   Serratia marcescens NOT DETECTED NOT DETECTED   Haemophilus influenzae NOT DETECTED NOT DETECTED   Neisseria meningitidis NOT DETECTED NOT DETECTED   Pseudomonas aeruginosa NOT DETECTED NOT DETECTED   Candida albicans NOT DETECTED NOT DETECTED   Candida glabrata NOT DETECTED NOT DETECTED   Candida krusei NOT DETECTED NOT DETECTED   Candida parapsilosis NOT DETECTED NOT DETECTED   Candida tropicalis NOT DETECTED NOT DETECTED    Excell Seltzer Poteet 05/21/2017  4:50 AM

## 2017-05-21 NOTE — Progress Notes (Signed)
Inpatient Diabetes Program Recommendations  AACE/ADA: New Consensus Statement on Inpatient Glycemic Control (2015)  Target Ranges:  Prepandial:   less than 140 mg/dL      Peak postprandial:   less than 180 mg/dL (1-2 hours)      Critically ill patients:  140 - 180 mg/dL   Lab Results  Component Value Date   GLUCAP 147 (H) 05/21/2017   HGBA1C 4.8 05/05/2017    Review of Glycemic Control  Diabetes history: DM2 Outpatient Diabetes medications: Glucotrol 5 qd Current orders for Inpatient glycemic control: None  Inpatient Diabetes Program Recommendations:   Noted A1c 4.8, renal labs, age, and currently with malnutrition. Recommend do not restart Glucotrol on D/C.  Thank you, Nani Gasser. Emeka Lindner, RN, MSN, CDE  Diabetes Coordinator Inpatient Glycemic Control Team Team Pager [phone] (8am-5pm) 05/21/2017 11:40 AM

## 2017-05-21 NOTE — ED Notes (Signed)
Belongings bag found in room after [facility] left. 2 Clothing items in bag. Bag handed off to security to be taken to the patient.

## 2017-05-21 NOTE — Consult Note (Addendum)
Reason for Consult: To manage dialysis and dialysis related needs Referring Physician: Dr. Elwin Sleight is an 84 y.o. male.  HPI: Pt is a 61M with a PMH significant for HTN, HLD, AS s/p bovine valve replacement 12/2010, bladder cancer, RAS s/p stent placement, Afib and GIB who is now seen in consultation at the request of Dr. Chase Caller for eval and recs re: ESRD and provision of HD.    History is obtained by chart review.  He is able to converse with me but unfortunately can't tell me much about the events leading up to him coming here.    He was recently admitted to [facility] 04/2017 with a GIB and melena.  Had EGD 05/08/2017 which was normal.  Coumadin was stopped.  Palliative care was consulted and he was full scope care at that time.    He presented yesterday with hypotension and blood all over his shirt.  He SBPs were in the 80s initially and he was febrile.  He was appropriately fluid resuscitated and is being treated with broad spectrum antibiotics for sepsis--> ADDENDUM, blood cultures growing MSSA 2/2.  He is hemodynamically stable now.  GI is being consulted for ? Recurrent GI bleed. We are asked to see.  In regards to his dialysis, he was just started on 03/2017.  He is dialyzing through a tunneled HD catheter.  He had L UE AVG inserted 04/24/2017 by Dr. Bridgett Larsson  Dialyzes at [facility]  EDW 69.5 kg. 4 hours MWF HD Bath 2K 2.5 Ca Dialyzer F180 BFR 400  Heparin none. Access [name] and maturing AVG Mircera 225 q 2 weeks, to start 4.1, Mircera 100 mcg given 3/18. No VDRA or binders No heparin  Past Medical History:  Diagnosis Date  . AS (aortic stenosis)    bovine aortic valve replacement 01/07/11  . Bladder cancer [facility])    Bladder Cancer local  . Blood transfusion    w/hip operation  . Cellulitis of left lower extremity   . Chronic atrial fibrillation ([place])   . Chronic diastolic congestive heart failure ([place])   . Claudication in peripheral vascular disease ([place]) 06/17/2011   . COPD (chronic obstructive pulmonary disease) ([place])   . Depression wife died 4 years ago.    Marland Kitchen History of stomach ulcers ~ 1951  . HTN (hypertension)   . Neuropathy due to secondary diabetes ([place])   . Peripheral vascular disease ([place]) very poor circulation legs and feet ... stents right and left legs... done in dr j. Gwenlyn Found 's office.   . Pneumonia 07/25/11   left  . Recurrent upper respiratory infection (URI)    sinusitis  . Renal artery stenosis ([place]) 2006   renal artery stent  . S/P angioplasty with stent, diamond back rotational athrectomy Prox. Rt. SFA 06/17/2011 06/17/2011  . Shortness of breath   . Thrombocytopenia due to drugs    seen by Dr Inda Merlin plts [id number] no rx  . Type II diabetes mellitus ([place])     Past Surgical History:  Procedure Laterality Date  . ABDOMINAL ANGIOGRAM  06/17/2011   Procedure: ABDOMINAL ANGIOGRAM;  Surgeon: Lorretta Harp, MD;  Location: [facility] CATH LAB;  Service: Cardiovascular;;  . AORTIC VALVE REPLACEMENT  01/07/2011   Procedure: AORTIC VALVE REPLACEMENT (AVR);  Surgeon: Grace Isaac, MD;  Location: [place];  Service: Open Heart Surgery;  Laterality: N/A;; magna-ease bovine 35m bioprosthesis  . ATHERECTOMY N/A 06/17/2011   Procedure: ATHERECTOMY;  Surgeon: JLorretta Harp MD;  Location:  [facility] CATH LAB;  Service: Cardiovascular;  Laterality: N/A;  . AV FISTULA PLACEMENT Left 04/24/2017   Procedure: INSERTION OF ARTERIOVENOUS (AV) GORE-TEX GRAFT ARM;  Surgeon: Conrad Claiborne, MD;  Location: [place];  Service: Vascular;  Laterality: Left;  . CARDIAC CATHETERIZATION  11/19/10   normal coronaries, mod AS, 75% l RAS  . CARDIOVERSION  04/03/2011   Procedure: CARDIOVERSION;  Surgeon: Leonie Man, MD;  Location: [place];  Service: Cardiovascular;  Laterality: N/A;  . CATARACT EXTRACTION W/ INTRAOCULAR LENS  IMPLANT, BILATERAL  ~ 2007  . CHOLECYSTECTOMY    . ESOPHAGOGASTRODUODENOSCOPY N/A 05/08/2017   Procedure: ESOPHAGOGASTRODUODENOSCOPY (EGD);  Surgeon: Irene Shipper, MD;  Location: [facility] ENDOSCOPY;  Service: Endoscopy;  Laterality: N/A;  . FEMUR IM NAIL  06/27/2011   Procedure: INTRAMEDULLARY (IM) NAIL FEMORAL;  Surgeon: Marin Shutter, MD;  Location: [facility] ORS;  Service: Orthopedics;  Laterality: Right;  . FEMUR IM NAIL Left 12/14/2013   Procedure: INTRAMEDULLARY (IM) NAIL FEMORAL;  Surgeon: Mauri Pole, MD;  Location: [facility];  Service: Orthopedics;  Laterality: Left;  . FRACTURE SURGERY    . IR FLUORO GUIDE CV LINE LEFT  04/17/2017  . IR US GUIDE VASC ACCESS LEFT  04/17/2017  . LOWER EXTREMITY ANGIOGRAM  06/17/2011   diamondback orbital rotational and cutting balloon atherectomy of the prox R SFA  . LOWER EXTREMITY ANGIOGRAM Bilateral 06/17/2011   Procedure: LOWER EXTREMITY ANGIOGRAM;  Surgeon: Lorretta Harp, MD;  Location: [facility] CATH LAB;  Service: Cardiovascular;  Laterality: Bilateral;  . PERIPHERAL ARTERIAL STENT GRAFT     2006 left anf right illiac stents Dr Deon Pilling  . RENAL ARTERY STENT  2006   "I believe"  . REVERSE SHOULDER ARTHROPLASTY Left 12/31/2016   Procedure: LEFT REVERSE SHOULDER ARTHROPLASTY;  Surgeon: Nicholes Stairs, MD;  Location: [place];  Service: Orthopedics;  Laterality: Left;  . TONSILLECTOMY AND ADENOIDECTOMY     "when I was a kid"    Family History  Problem Relation Age of Onset  . Heart disease Mother   . Cancer Brother        colon  . Stroke Father     Social History:  reports that he has been smoking cigarettes.  He has a 112.50 pack-year smoking history. He has never used smokeless tobacco. He reports that he does not drink alcohol or use drugs.  Allergies:  Allergies  Allergen Reactions  . Aspirin Anaphylaxis, Shortness Of Breath, Rash and Other (See Comments)    "broke out in white welts; red blotches neck and face; windpipe closed; ~ 1962"  . Tape Other (See Comments)    Patient's skin is VERY THIN and tears and bruises easily!!    Medications:  Scheduled: . feeding supplement (NEPRO CARB STEADY)  237  mL Oral TID BM  . pantoprazole  40 mg Oral Q0600  . umeclidinium-vilanterol  1 puff Inhalation Daily     Results for orders placed or performed during the hospital encounter of 05/20/17 (from the past 48 hour(s))  Culture, blood (Routine x 2)     Status: None (Preliminary result)   Collection Time: 05/20/17  1:36 PM  Result Value Ref Range   Specimen Description      BLOOD LEFT HAND Performed at [facility] [address], [place], [place] [address]    Special Requests      BOTTLES DRAWN AEROBIC AND ANAEROBIC Blood Culture adequate volume Performed at [address] [place], [place] [address]    Culture  Setup Time      GRAM POSITIVE COCCI AEROBIC BOTTLE ONLY Organism ID to follow CRITICAL RESULT CALLED TO, READ BACK BY AND VERIFIED WITH: L SEAY PHARMD 05/21/17 0427 JDW    Culture      NO GROWTH < 24 HOURS Performed at [facility], [address], [place], [place] [address]    Report Status PENDING   Blood Culture ID Panel (Reflexed)     Status: Abnormal   Collection Time: 05/20/17  1:36 PM  Result Value Ref Range   Enterococcus species NOT DETECTED NOT DETECTED   Listeria monocytogenes NOT DETECTED NOT DETECTED   Staphylococcus species DETECTED (A) NOT DETECTED    Comment: CRITICAL RESULT CALLED TO, READ BACK BY AND VERIFIED WITH: L SEAY PHARMD 05/21/17 0427 JDW    Staphylococcus aureus DETECTED (A) NOT DETECTED    Comment: Methicillin (oxacillin) susceptible Staphylococcus aureus (MSSA). Preferred therapy is anti staphylococcal beta lactam antibiotic (Cefazolin or Nafcillin), unless clinically contraindicated. CRITICAL RESULT CALLED TO, READ BACK BY AND VERIFIED WITH: L SEAY PHARMD 05/21/17 0427 JDW    Methicillin resistance NOT DETECTED NOT DETECTED   Streptococcus species NOT DETECTED NOT DETECTED   Streptococcus agalactiae NOT DETECTED NOT DETECTED   Streptococcus pneumoniae NOT DETECTED NOT DETECTED    Streptococcus pyogenes NOT DETECTED NOT DETECTED   Acinetobacter baumannii NOT DETECTED NOT DETECTED   Enterobacteriaceae species NOT DETECTED NOT DETECTED   Enterobacter cloacae complex NOT DETECTED NOT DETECTED   Escherichia coli NOT DETECTED NOT DETECTED   Klebsiella oxytoca NOT DETECTED NOT DETECTED   Klebsiella pneumoniae NOT DETECTED NOT DETECTED   Proteus species NOT DETECTED NOT DETECTED   Serratia marcescens NOT DETECTED NOT DETECTED   Haemophilus influenzae NOT DETECTED NOT DETECTED   Neisseria meningitidis NOT DETECTED NOT DETECTED   Pseudomonas aeruginosa NOT DETECTED NOT DETECTED   Candida albicans NOT DETECTED NOT DETECTED   Candida glabrata NOT DETECTED NOT DETECTED   Candida krusei NOT DETECTED NOT DETECTED   Candida parapsilosis NOT DETECTED NOT DETECTED   Candida tropicalis NOT DETECTED NOT DETECTED    Comment: Performed at [facility], [place] [address], [place], [place] [address]  Culture, blood (Routine x 2)     Status: None (Preliminary result)   Collection Time: 05/20/17  1:50 PM  Result Value Ref Range   Specimen Description      BLOOD RIGHT ANTECUBITAL Performed at [facility] [address] [place], [place] [address]    Special Requests      BOTTLES DRAWN AEROBIC AND ANAEROBIC Blood Culture adequate volume Performed at [facility] [address], [place], [place] [address]    Culture  Setup Time      GRAM POSITIVE COCCI ANAEROBIC BOTTLE ONLY CRITICAL RESULT CALLED TO, READ BACK BY AND VERIFIED WITH: L SEAY PHARMD 05/21/17 0427 JDW    Culture      NO GROWTH < 24 HOURS Performed at [facility], [place] [address] [place], [place] [address]    Report Status PENDING   Comprehensive metabolic panel     Status: Abnormal   Collection Time: 05/20/17  1:52 PM  Result Value Ref Range   Sodium 137 135 - 145 mmol/L   Potassium 4.0 3.5 - 5.1 mmol/L   Chloride 99 (L) 101 - 111 mmol/L   CO2 25 22 - 32 mmol/L    Glucose, Bld 62 (L) 65 - 99  mg/dL   BUN 28 (H) 6 - 20 mg/dL   Creatinine, Ser 3.94 (H) 0.61 - 1.24 mg/dL   Calcium 8.0 (L) 8.9 - 10.3 mg/dL   Total Protein 6.5 6.5 - 8.1 g/dL   Albumin 2.5 (L) 3.5 - 5.0 g/dL   AST 43 (H) 15 - 41 U/L   ALT 18 17 - 63 U/L   Alkaline Phosphatase 79 38 - 126 U/L   Total Bilirubin 1.2 0.3 - 1.2 mg/dL   GFR calc non Af Amer 12 (L) >60 mL/min   GFR calc Af Amer 14 (L) >60 mL/min    Comment: (NOTE) The eGFR has been calculated using the CKD EPI equation. This calculation has not been validated in all clinical situations. eGFR's persistently <60 mL/min signify possible Chronic Kidney Disease.    Anion gap 13 5 - 15    Comment: Performed at [facility], [place] [address], [place], [place] [address]  CBC with Differential     Status: Abnormal   Collection Time: 05/20/17  1:52 PM  Result Value Ref Range   WBC 18.9 (H) 4.0 - 10.5 K/uL   RBC 3.11 (L) 4.22 - 5.81 MIL/uL   Hemoglobin 9.6 (L) 13.0 - 17.0 g/dL   HCT 30.8 (L) 39.0 - 52.0 %   MCV 99.0 78.0 - 100.0 fL   MCH 30.9 26.0 - 34.0 pg   MCHC 31.2 30.0 - 36.0 g/dL   RDW 19.4 (H) 11.5 - 15.5 %   Platelets 100 (L) 150 - 400 K/uL    Comment: REPEATED TO VERIFY SPECIMEN CHECKED FOR CLOTS PLATELET COUNT CONFIRMED BY SMEAR    Neutrophils Relative % 93 %   Neutro Abs 17.6 (H) 1.7 - 7.7 K/uL   Lymphocytes Relative 3 %   Lymphs Abs 0.5 (L) 0.7 - 4.0 K/uL   Monocytes Relative 4 %   Monocytes Absolute 0.7 0.1 - 1.0 K/uL   Eosinophils Relative 0 %   Eosinophils Absolute 0.0 0.0 - 0.7 K/uL   Basophils Relative 0 %   Basophils Absolute 0.0 0.0 - 0.1 K/uL    Comment: Performed at [facility], [place] [address], [place], [place] [address]  Protime-INR     Status: Abnormal   Collection Time: 05/20/17  1:52 PM  Result Value Ref Range   Prothrombin Time 16.8 (H) 11.4 - 15.2 seconds   INR 1.37     Comment: Performed at [facility], [place] [address]  [place], [place] [address]  I-Stat Troponin, ED (not at [facility])     Status: None   Collection Time: 05/20/17  1:58 PM  Result Value Ref Range   Troponin i, poc 0.08 0.00 - 0.08 ng/mL   Comment 3            Comment: Due to the release kinetics of cTnI, a negative result within the first hours of the onset of symptoms does not rule out myocardial infarction with certainty. If myocardial infarction is still suspected, repeat the test at appropriate intervals.   I-Stat CG4 Lactic Acid, ED     Status: Abnormal   Collection Time: 05/20/17  1:59 PM  Result Value Ref Range   Lactic Acid, Venous 2.05 (HH) 0.5 - 1.9 mmol/L   Comment NOTIFIED PHYSICIAN   I-Stat Chem 8, ED     Status: Abnormal   Collection Time: 05/20/17  1:59 PM  Result Value Ref Range   Sodium 138 135 - 145 mmol/L   Potassium 4.0  3.5 - 5.1 mmol/L   Chloride 97 (L) 101 - 111 mmol/L   BUN 31 (H) 6 - 20 mg/dL   Creatinine, Ser 4.00 (H) 0.61 - 1.24 mg/dL   Glucose, Bld 60 (L) 65 - 99 mg/dL   Calcium, Ion 1.03 (L) 1.15 - 1.40 mmol/L   TCO2 29 22 - 32 mmol/L   Hemoglobin 10.5 (L) 13.0 - 17.0 g/dL   HCT 31.0 (L) 39.0 - 52.0 %  POC occult blood, ED     Status: Abnormal   Collection Time: 05/20/17  2:02 PM  Result Value Ref Range   Fecal Occult Bld POSITIVE (A) NEGATIVE  I-Stat CG4 Lactic Acid, ED     Status: None   Collection Time: 05/20/17  4:22 PM  Result Value Ref Range   Lactic Acid, Venous 1.43 0.5 - 1.9 mmol/L  Glucose, capillary     Status: Abnormal   Collection Time: 05/21/17  2:38 AM  Result Value Ref Range   Glucose-Capillary 49 (L) 65 - 99 mg/dL   Comment 1 Notify RN   Glucose, capillary     Status: Abnormal   Collection Time: 05/21/17  3:08 AM  Result Value Ref Range   Glucose-Capillary 145 (H) 65 - 99 mg/dL  Glucose, capillary     Status: Abnormal   Collection Time: 05/21/17  3:42 AM  Result Value Ref Range   Glucose-Capillary 147 (H) 65 - 99 mg/dL   Comment 1 Notify RN   Basic metabolic panel     Status:  Abnormal   Collection Time: 05/21/17  4:03 AM  Result Value Ref Range   Sodium 136 135 - 145 mmol/L   Potassium 3.9 3.5 - 5.1 mmol/L   Chloride 99 (L) 101 - 111 mmol/L   CO2 23 22 - 32 mmol/L   Glucose, Bld 144 (H) 65 - 99 mg/dL   BUN 37 (H) 6 - 20 mg/dL   Creatinine, Ser 4.42 (H) 0.61 - 1.24 mg/dL   Calcium 7.5 (L) 8.9 - 10.3 mg/dL   GFR calc non Af Amer 10 (L) >60 mL/min   GFR calc Af Amer 12 (L) >60 mL/min    Comment: (NOTE) The eGFR has been calculated using the CKD EPI equation. This calculation has not been validated in all clinical situations. eGFR's persistently <60 mL/min signify possible Chronic Kidney Disease.    Anion gap 14 5 - 15    Comment: Performed at [facility] [address], [place], [place] [address]  Magnesium     Status: None   Collection Time: 05/21/17  4:03 AM  Result Value Ref Range   Magnesium 1.9 1.7 - 2.4 mg/dL    Comment: Performed at [name] [address], [place], [place] [address]  Phosphorus     Status: Abnormal   Collection Time: 05/21/17  4:03 AM  Result Value Ref Range   Phosphorus 5.7 (H) 2.5 - 4.6 mg/dL    Comment: Performed at [facility] [address], [place], [place] [address]  Cortisol     Status: None   Collection Time: 05/21/17  4:03 AM  Result Value Ref Range   Cortisol, Plasma 53.6 ug/dL    Comment: (NOTE) AM    6.7 - 22.6 ug/dL PM   <10.0       ug/dL Performed at [facility] [address] [place], [place] [address]   Protime-INR     Status: Abnormal   Collection Time: 05/21/17  4:03 AM  Result Value Ref Range   Prothrombin Time 17.7 (H) 11.4 - 15.2 seconds   INR 1.47     Comment: Performed at [facility] [address], [place], [place] [address]  CBC     Status: Abnormal   Collection Time: 05/21/17  4:03 AM  Result Value Ref Range   WBC 13.9 (H) 4.0 - 10.5 K/uL   RBC 2.79 (L) 4.22 - 5.81 MIL/uL   Hemoglobin 8.4 (L) 13.0 - 17.0 g/dL   HCT 27.0 (L) 39.0 - 52.0 %   MCV  96.8 78.0 - 100.0 fL   MCH 30.1 26.0 - 34.0 pg   MCHC 31.1 30.0 - 36.0 g/dL   RDW 19.2 (H) 11.5 - 15.5 %   Platelets 70 (L) 150 - 400 K/uL    Comment: CONSISTENT WITH PREVIOUS RESULT Performed at [facility], [place] [address], [place], [place] [address]     Ct Abdomen Pelvis Wo Contrast  Result Date: 05/20/2017 CLINICAL DATA:  Hematemesis.  Possible sepsis. EXAM: CT CHEST, ABDOMEN AND PELVIS WITHOUT CONTRAST TECHNIQUE: Multidetector CT imaging of the chest, abdomen and pelvis was performed following the standard protocol without IV contrast. COMPARISON:  CT scan of Jun 29, 2011. FINDINGS: CT CHEST FINDINGS Cardiovascular: Atherosclerosis of thoracic aorta is noted without aneurysm formation. Left internal jugular catheter is noted with tip at cavoatrial junction. Normal cardiac size. No pericardial effusion. Status post aortic valve replacement. Mediastinum/Nodes: No enlarged mediastinal, hilar, or axillary lymph nodes. Thyroid gland, trachea, and esophagus demonstrate no significant findings. Lungs/Pleura: No pneumothorax is noted. Moderate right pleural effusion is noted with adjacent atelectasis or infiltrate in the right lower lobe. Minimal left pleural effusion is noted with adjacent subsegmental atelectasis. Musculoskeletal: No chest wall mass or suspicious bone lesions identified. CT ABDOMEN PELVIS FINDINGS Hepatobiliary: No focal liver abnormality is seen. No gallstones, gallbladder wall thickening, or biliary dilatation. Pancreas: No ductal dilatation is noted. 2.2 cm rounded low density is noted in pancreatic body which is slightly enlarged compared to prior exam. Spleen: Normal in size without focal abnormality. Adrenals/Urinary Tract: Adrenal glands are unremarkable. Stable left renal cyst. No hydronephrosis or renal obstruction is noted. No renal or ureteral calculi are noted. Urinary bladder is unremarkable. Stomach/Bowel: The stomach appears normal. There is no evidence of bowel  obstruction or inflammation. Vascular/Lymphatic: Aortic atherosclerosis. No enlarged abdominal or pelvic lymph nodes. Reproductive: Prostate is unremarkable. Other: No abdominal wall hernia or abnormality. No abdominopelvic ascites. Musculoskeletal: No acute or significant osseous findings. IMPRESSION: 2.2 cm rounded density is noted in pancreatic body which is slightly enlarged compared to prior exam of 2013. Recommend follow up pre and post contrast MRI/MRCP or pancreatic protocol CT in 2 years. This recommendation follows ACR consensus guidelines: Management of Incidental Pancreatic Cysts: A White Paper of the ACR Incidental Findings Committee. [place] 3785;[id number]. Moderate right pleural effusion is noted with adjacent atelectasis or infiltrate in the right lower lobe. Minimal left pleural effusion is noted with adjacent subsegmental atelectasis. Stable left renal cyst. Aortic Atherosclerosis (ICD10-I70.0). Electronically Signed   By: Marijo Conception, M.D.   On: 05/20/2017 17:27   Ct Chest Wo Contrast  Result Date: 05/20/2017 CLINICAL DATA:  Hematemesis.  Possible sepsis. EXAM: CT CHEST, ABDOMEN AND PELVIS WITHOUT CONTRAST TECHNIQUE: Multidetector CT imaging of the chest, abdomen and pelvis was performed following the standard protocol without IV contrast. COMPARISON:  CT scan of Jun 29, 2011. FINDINGS: CT CHEST FINDINGS Cardiovascular: Atherosclerosis  of thoracic aorta is noted without aneurysm formation. Left internal jugular catheter is noted with tip at cavoatrial junction. Normal cardiac size. No pericardial effusion. Status post aortic valve replacement. Mediastinum/Nodes: No enlarged mediastinal, hilar, or axillary lymph nodes. Thyroid gland, trachea, and esophagus demonstrate no significant findings. Lungs/Pleura: No pneumothorax is noted. Moderate right pleural effusion is noted with adjacent atelectasis or infiltrate in the right lower lobe. Minimal left pleural effusion is noted with  adjacent subsegmental atelectasis. Musculoskeletal: No chest wall mass or suspicious bone lesions identified. CT ABDOMEN PELVIS FINDINGS Hepatobiliary: No focal liver abnormality is seen. No gallstones, gallbladder wall thickening, or biliary dilatation. Pancreas: No ductal dilatation is noted. 2.2 cm rounded low density is noted in pancreatic body which is slightly enlarged compared to prior exam. Spleen: Normal in size without focal abnormality. Adrenals/Urinary Tract: Adrenal glands are unremarkable. Stable left renal cyst. No hydronephrosis or renal obstruction is noted. No renal or ureteral calculi are noted. Urinary bladder is unremarkable. Stomach/Bowel: The stomach appears normal. There is no evidence of bowel obstruction or inflammation. Vascular/Lymphatic: Aortic atherosclerosis. No enlarged abdominal or pelvic lymph nodes. Reproductive: Prostate is unremarkable. Other: No abdominal wall hernia or abnormality. No abdominopelvic ascites. Musculoskeletal: No acute or significant osseous findings. IMPRESSION: 2.2 cm rounded density is noted in pancreatic body which is slightly enlarged compared to prior exam of 2013. Recommend follow up pre and post contrast MRI/MRCP or pancreatic protocol CT in 2 years. This recommendation follows ACR consensus guidelines: Management of Incidental Pancreatic Cysts: A White Paper of the ACR Incidental Findings Committee. [place] 1610;[id number]. Moderate right pleural effusion is noted with adjacent atelectasis or infiltrate in the right lower lobe. Minimal left pleural effusion is noted with adjacent subsegmental atelectasis. Stable left renal cyst. Aortic Atherosclerosis (ICD10-I70.0). Electronically Signed   By: Marijo Conception, M.D.   On: 05/20/2017 17:27   Dg Chest Portable 1 View  Result Date: 05/20/2017 CLINICAL DATA:  Suspicion of sepsis. EXAM: PORTABLE CHEST 1 VIEW COMPARISON:  Portable chest x-ray of April 19, 2017 FINDINGS: The lungs are  well-expanded. The interstitial markings remain increased bilaterally greatest on the right. The cardiac silhouette remains mildly enlarged. The pulmonary vascularity is engorged. The dual-lumen dialysis catheter tip projects at the cavoatrial junction. The sternal wires and left atrial appendage clip are stable. IMPRESSION: CHF with mild interstitial edema. One cannot exclude basilar pneumonia on the right. Electronically Signed   By: David  Martinique M.D.   On: 05/20/2017 15:05    ROS: all other systems reviewed and are negative except as per HPI Blood pressure (!) 117/50, pulse 94, temperature (!) 97.4 F (36.3 C), temperature source Oral, resp. rate (!) 30, weight 70.3 kg (154 lb 15.7 oz), SpO2 98 %. .  GEN elderly man, NAD, lying in bed HEENT EOMI PERRL dry MM some scant dried blood posteriorly NECK no JVD PULM clear bilaterally CV RRR today, soft systolic murmur with louder S2 ABD soft, nontender, thin, no bruising  EXT no LE edema NEURO able to converse SKIN  Warm and dry ACCESS: L IJ TDC, L UE AVG  Assessment/Plan: 1 Presumed sepsis: ADDEND- MSSA 2/2 blood cultures- on ancef/ zosyn, CT C/A/P with possible RLL PNA and pleural effusion.  Cultures already +. Being transferred to floor.  Per primary. 2. GI bleed- story with blood all over shirt.  GI c/s 3 ESRD: MWF- HD on schedule today, will c/s VVS for line removal and if OK to use AVG. 4 Hypertension: hold BP  meds 5. Anemia of ESRD: on Max ESA at OP center, dose Aranesp 200 mcg q week for today 6. Metabolic Bone Disease: no binders or VDRA as OP 7. Dispo: he is frail and to be frank he has been repeatedly admitted to hospital essentially since starting dialysis.  I think that he needs another pall care c/s to define scope of care.  I doubt that HD is contributing to QOL at this point and may actually be perpetuating problems for him.  Madelon Lips 05/21/2017, 11:50 AM

## 2017-05-21 NOTE — Procedures (Addendum)
PRE-OPERATIVE DIAGNOSIS:   Suspected dialysis catheter infection.   POST-OPERATIVE DIAGNOSIS:  same.  PROCEDURE:  Removal of left IJ perm cath.   SURGEON:   Dagoberto Ligas, PA-C.  ANESTHESIA:  none.  The risks and benefits of this procedure were explained to the patient and the patient consented.   OPERATIVE PROCEDURE:  The left side of patient's neck and chest was prepped and draped in standard fashion.  The catheter was removed in its entirety with moderate resistance when pulled.  Hemostasis was achieved with local compression for about 10 minutes.    The patient tolerated the procedure well, did not have any intraoperative complications and was transferred to recovery room in stable condition.  FINDINGS/RECOMMENDATIONS:   Catheter cuff found to be well adhered considering only one month since placement.    L arm AV graft with palpable thrill and audible bruit.  No areas of fluctuance noted on exam.  OK to use L arm AV graft for dialysis.

## 2017-05-21 NOTE — Consult Note (Addendum)
[facility] Consult: 9:21 AM 05/21/2017  LOS: 1 day    Referring Provider: Dr Chase Caller  Primary Care Physician:  [name], MD Primary Gastroenterologist:   Dr. Sharlett Iles >> Henrene Pastor as inpt >> Carlean Purl as inpt>>Perry as inpt   Reason for Consultation:  "Hematemesis",  actually likely hemoptysis.     HPI: Ronald [name] is a 84 y.o. male.  PMH type 2 DM,insulin requiring. PVD. s/p renal artery stent. s/p bil iliac stents.  ESRD, began HD during 04/10/17 admission with diastolic CHF.  Previously on Coumadin for A fib. Diastolic CHF. Bladder cancer. Aortic stenosis, s/p bovine AVR May 30, 2010. Gastric ulcer in 1950s. Chronic anemia, on po Iron.  PRBC  Transfusions: 2 U 06/2017, 1 U 12/2016, 3 units 03/2017.  COPD. Moderate pulmonary htn.  Smoker. Chronic thrombocytopenia dating back to2014with counts in the60s to 37s.Osteoporosis on Fosamax. left hip ORIF 05/29/13.  Left shoulder arthroplasty post fall/fracture 12/2016.  Borderline hepatomegaly, no splenomegaly, GB polyps per ultrasound 2006-05-30.  Liver normal on non-contrast CT in 05/30/2011.    Hx colon polyps.Hyperplastic polyp 1991. Tubular adenoma 2003-05-30. His brother died at age 5 from colon cancer.07/2006 Colonoscopy,for constipation and polyp surveillance:Lipomatous IC valve and diverticulosis, no new polyps. No previous upper endoscopies in the Epic record. 02/2017 MBSS:  Moderate oropharyngeal dysphagia.  Late laryngeal closure leading to sensed penetration to the cords during the swallow. Pt clears throat or coughs softly, but not forcefully enough to fully eject penetrate.  Moderate aspiration risk.  OK for solids, thin liquids.   This is admission # 8 since 06/2016.  Wheelchair bound since 12/2016 when he broke his left humerus. Latest admission 3/11 - 05/09/17  with GI bleed, deconditioning, stage 2 pressure ulcer.  Coumadin discontinued, Protonix continued at discharge.    GI consult 04/10/17 and 05/06/17 for anemia, FOBT +, ? Intermittent dark stools, intermittent epistaxis, in setting of Coumadin +/- supratherapeutic INR.  05/08/17 EGD, Dr Henrene Pastor: Normal, no lesions, no old or fresh blood.  Dr Henrene Pastor felt bleeding likely from swallowed ENT source.  Anemia managed with oral iron, Ferelicit, Mircera.  Received 3 U PRBCs in 03/2017 (nadir Hgb 7.3) and none in 29-May-2017 (Nadir Hgb 8.8).   Now readmitted from SNF with feeling tired, lethargy, fever 103.3, tachypnia, blood seen on front of his shirt, hypotension.   CT chest with bil pleural effusion and atx, 2.2 cm density in pancreatic body, slightly larger than in 30-May-2011.  CCM has assumed care but will transfer him to renal floor and hospitalist today.  Stable overnight.  .    Hgb 9.6 >> 10.5 >> 8.4.  MCV 96.  Platelets 70 K (previous range 99 -195 in Feb/early 05-29-17).  PT?INR 17.7/1.4.   Family asked pt where the blood came from and he apparently said it may have come from coughing, he denied emesis.  Right now he is unable to even recall having seen blood.  He is a poor historian.  No abdominal pain, no N/V.  No emesis, no stools.  Persistent hypotension, temps now low ~ 97.5, no  tachycardia.   Being kept NPO in case GI wants to pursue endoscopy.      Pt insists on being Full Code.  Still smokes.         Past Medical History:  Diagnosis Date  . AS (aortic stenosis)    bovine aortic valve replacement 01/07/11  . Bladder cancer [facility])    Bladder Cancer local  . Blood transfusion    w/hip operation  . Cellulitis of left lower extremity   . Chronic atrial fibrillation ([place])   . Chronic diastolic congestive heart failure ([place])   . Claudication in peripheral vascular disease ([place]) 06/17/2011  . COPD (chronic obstructive pulmonary disease) ([place])   . Depression wife died 4 years ago.    Marland Kitchen History of stomach  ulcers ~ 1951  . HTN (hypertension)   . Neuropathy due to secondary diabetes ([place])   . Peripheral vascular disease ([place]) very poor circulation legs and feet ... stents right and left legs... done in dr j. Gwenlyn Found 's office.   . Pneumonia 07/25/11   left  . Recurrent upper respiratory infection (URI)    sinusitis  . Renal artery stenosis ([place]) 2006   renal artery stent  . S/P angioplasty with stent, diamond back rotational athrectomy Prox. Rt. SFA 06/17/2011 06/17/2011  . Shortness of breath   . Thrombocytopenia due to drugs    seen by Dr Inda Merlin plts [id number] no rx  . Type II diabetes mellitus ([place])     Past Surgical History:  Procedure Laterality Date  . ABDOMINAL ANGIOGRAM  06/17/2011   Procedure: ABDOMINAL ANGIOGRAM;  Surgeon: Lorretta Harp, MD;  [facility] C[facility] LAB;  Service: Cardiovascular;;  . AORTIC VALVE REPLACEMENT  01/07/2011   Procedure: AORTIC VALVE REPLACEMENT (AVR);  Surgeon: Grace Isaac, MD;  Location: [place];  Service: Open Heart Surgery;  Laterality: N/A;; magna-ease bovine 37mm bioprosthesis  . ATHERECTOMY N/A 06/17/2011   Procedure: ATHERECTOMY;  Surgeon: Lorretta Harp, MD;  Location: [facility] CATH LAB;  Service: Cardiovascular;  Laterality: N/A;  . AV FISTULA PLACEMENT Left 04/24/2017   Procedure: INSERTION OF ARTERIOVENOUS (AV) GORE-TEX GRAFT ARM;  Surgeon: Conrad Indio, MD;  Location: [place];  Service: Vascular;  Laterality: Left;  . CARDIAC CATHETERIZATION  11/19/10   normal coronaries, mod AS, 75% l RAS  . CARDIOVERSION  04/03/2011   Procedure: CARDIOVERSION;  Surgeon: Leonie Man, MD;  Location: [place];  Service: Cardiovascular;  Laterality: N/A;  . CATARACT EXTRACTION W/ INTRAOCULAR LENS  IMPLANT, BILATERAL  ~ 2007  . CHOLECYSTECTOMY    . ESOPHAGOGASTRODUODENOSCOPY N/A 05/08/2017   Procedure: ESOPHAGOGASTRODUODENOSCOPY (EGD);  Surgeon: Irene Shipper, MD;  Location: [facility] ENDOSCOPY;  Service: Endoscopy;  Laterality: N/A;  . FEMUR IM NAIL  06/27/2011   Procedure:  INTRAMEDULLARY (IM) NAIL FEMORAL;  Surgeon: Marin Shutter, MD;  Location: [facility] ORS;  Service: Orthopedics;  Laterality: Right;  . FEMUR IM NAIL Left 12/14/2013   Procedure: INTRAMEDULLARY (IM) NAIL FEMORAL;  Surgeon: Mauri Pole, MD;  Location: [facility] ORS;  Service: Orthopedics;  Laterality: Left;  . FRACTURE SURGERY    . IR FLUORO GUIDE CV LINE LEFT  04/17/2017  . IR US GUIDE VASC ACCESS LEFT  04/17/2017  . LOWER EXTREMITY ANGIOGRAM  06/17/2011   diamondback orbital rotational and cutting balloon atherectomy of the prox R SFA  . LOWER EXTREMITY ANGIOGRAM Bilateral 06/17/2011   Procedure: LOWER EXTREMITY ANGIOGRAM;  Surgeon: Lorretta Harp, MD;  Location: [facility] CATH LAB;  Service: Cardiovascular;  Laterality: Bilateral;  . PERIPHERAL ARTERIAL STENT GRAFT     2006 left anf right illiac stents Dr Deon Pilling  . RENAL ARTERY STENT  2006   "I believe"  . REVERSE SHOULDER ARTHROPLASTY Left 12/31/2016   Procedure: LEFT REVERSE SHOULDER ARTHROPLASTY;  Surgeon: Nicholes Stairs, MD;  Location: [place];  Service: Orthopedics;  Laterality: Left;  . TONSILLECTOMY AND ADENOIDECTOMY     "when I was a kid"    Prior to Admission medications   Medication Sig Start Date End Date Taking? Authorizing Provider  acetaminophen (TYLENOL) 325 MG tablet Take 650 mg by mouth every 6 (six) hours as needed ("for pain score 1-4; not to exceed #8 tablets in 24 hours").    Yes [provider]  albuterol (ACCUNEB) 0.63 MG/3ML nebulizer solution Take 3 mLs by nebulization every 6 (six) hours as needed for wheezing or shortness of breath.   Yes [provider]  buPROPion (WELLBUTRIN XL) 150 MG 24 hr tablet Take 150 mg by mouth daily.    Yes [provider]  diphenhydrAMINE (BENADRYL) 12.5 MG/5ML elixir Take 5 mLs (12.5 mg total) by mouth every 6 (six) hours as needed for itching. Over the counter Patient taking differently: Take 12.5 mg by mouth every 6 (six) hours as needed for itching.  04/25/17  Yes Rai,  Ripudeep K, MD  doxazosin (CARDURA) 2 MG tablet Take 1 tablet (2 mg total) by mouth at bedtime. 04/25/17  Yes Rai, Ripudeep K, MD  ferrous sulfate 325 (65 FE) MG EC tablet Take 1 tablet (325 mg total) by mouth 2 (two) times daily. Patient taking differently: Take 325 mg by mouth 2 (two) times daily with a meal.  07/04/16 07/04/17 Yes Lavina Hamman, MD  finasteride (PROSCAR) 5 MG tablet Take 5 mg by mouth at bedtime.    Yes [provider]  fluticasone (FLONASE) 50 MCG/ACT nasal spray Place 1 spray into both nostrils 2 (two) times daily.    Yes [provider]  glipiZIDE (GLUCOTROL) 5 MG tablet Take 5 mg by mouth daily before breakfast.   Yes [provider]  loratadine (CLARITIN) 10 MG tablet Take 10 mg by mouth daily.   Yes [provider]  Melatonin 5 MG TABS Take 5 mg by mouth at bedtime.   Yes [provider]  metoprolol succinate (TOPROL-XL) 25 MG 24 hr tablet Take 0.5 tablets (12.5 mg total) by mouth 2 (two) times daily with a meal. 04/26/17  Yes Rai, Ripudeep K, MD  Multiple Vitamins-Minerals (MULTIVITAMIN WITH MINERALS) tablet Take 1 tablet by mouth at bedtime.   Yes [provider]  neomycin-bacitracin-polymyxin (NEOSPORIN) ointment Apply 1 application topically daily. Apply to left elbow and forearm daily for 10 days. Started 05/14/17.   Yes [provider]  pantoprazole (PROTONIX) 40 MG tablet Take 1 tablet (40 mg total) by mouth daily. 05/10/17  Yes Aline August, MD  simvastatin (ZOCOR) 20 MG tablet Take 20 mg by mouth every evening.   Yes [provider]  traMADol (ULTRAM) 50 MG tablet Take 1 tablet (50 mg total) by mouth every 12 (twelve) hours as needed for moderate pain. 05/09/17  Yes Aline August, MD  Umeclidinium-Vilanterol (ANORO ELLIPTA) 62.5-25 MCG/INH AEPB Inhale 1 puff into the lungs daily.   Yes [provider]  OXYGEN Inhale 2 L into the lungs continuous.    [provider]    Scheduled  Meds: . pantoprazole (PROTONIX) IV  40 mg Intravenous QHS  . umeclidinium-vilanterol  1  puff Inhalation Daily   Infusions: . piperacillin-tazobactam (ZOSYN)  IV Stopped (05/21/17 0412)   PRN Meds: ipratropium-albuterol   Allergies as of 05/20/2017 - Review Complete 05/20/2017  Allergen Reaction Noted  . Aspirin Anaphylaxis, Shortness Of Breath, Rash, and Other (See Comments) 05/30/2008  . Tape Other (See Comments) 05/05/2017    Family History  Problem Relation Age of Onset  . Heart disease Mother   . Cancer Brother        colon  . Stroke Father     Social History   Socioeconomic History  . Marital status: Widowed    Spouse name: Not on file  . Number of children: 3  . Years of education: Not on file  . Highest education level: Not on file  Occupational History  . Occupation: Geographical information systems officer  . Financial resource strain: Not on file  . Food insecurity:    Worry: Not on file    Inability: Not on file  . Transportation needs:    Medical: Not on file    Non-medical: Not on file  Tobacco Use  . Smoking status: Current Every Day Smoker    Packs/day: 1.50    Years: 75.00    Pack years: 112.50    Types: Cigarettes  . Smokeless tobacco: Never Used  Substance and Sexual Activity  . Alcohol use: No    Alcohol/week: 1.2 oz    Types: 2 Cans of beer per week  . Drug use: No  . Sexual activity: Never  Lifestyle  . Physical activity:    Days per week: Not on file    Minutes per session: Not on file  . Stress: Not on file  Relationships  . Social connections:    Talks on phone: Not on file    Gets together: Not on file    Attends religious service: Not on file    Active member of club or organization: Not on file    Attends meetings of clubs or organizations: Not on file    Relationship status: Not on file  . Intimate partner violence:    Fear of current or ex partner: Not on file    Emotionally abused: Not on file    Physically abused: Not on file     Forced sexual activity: Not on file  Other Topics Concern  . Not on file  Social History Narrative  . Not on file    REVIEW OF SYSTEMS: Constitutional:  Weakness, fatigue, acute on chronic ENT:  No nose bleeds Pulm:  No coughing.   Possible hemoptysis CV:  No palpitations, no LE edema.  GU:  No hematuria, no frequency GI:  Per HPI Heme:  No unusual bleeding or bruising reported   Transfusions:  Per HPI Neuro:  No headaches, no peripheral tingling or numbness Derm:  No itching, no rash or sores.  Endocrine:  No sweats.  Feels cold all the time.  No polyuria or dysuria Immunization:  Pt does not recall any vaccinations Travel:  None beyond local counties in last few months.    PHYSICAL EXAM: Vital signs in last 24 hours: Vitals:   05/21/17 0715 05/21/17 0800  BP:  134/63  Pulse:  81  Resp:  (!) 26  Temp: (!) 97.4 F (36.3 C)   SpO2:  100%   Wt Readings from Last 3 Encounters:  05/21/17 154 lb 15.7 oz (70.3 kg)  05/09/17 152 lb 8.9 oz (69.2 kg)  05/05/17 158 lb (71.7 kg)    General:  frail, aged, comfortable Head:  No asymmetry or signs of trauma  Eyes:  No icterus or conj pallor Ears:  HOH  Nose:  No discharge.   Unable to locate otoscope to examine nares but no dried blood seen on visual exam. Mouth:  Dry oral MM and tongue.  No fresh or old blood.  No lesions.  Tongue midline Neck:  No JVD, no mass.   Lungs:  Clear bil.  Reduced BS overall.  No dypnea and no cough.  HD catheter on left chest.  No blood seen on bandage.    Heart: RRR on monitor.  No MRG.  S1, S2 present.   Abdomen:  Soft, thin, NT, ND. No HSM, masses, bruits  Rectal: deferred   Musc/Skeltl: scar intact on left shoulder with surrounding bruising.   Extremities:  Some erythematous spots on toes (Janeway's lesions?), left > right foot.  Feet are warm, no mottling.    Neurologic:  Oriented to self, place.  Overall very limited recall of recent events.  Moves all 4 limbs, no tremor.  Strength not tested.    Skin:  No rashes.  Purpura on UE.   Tattoos:  none Nodes:  No cervical adenopathy   Psych:  Calm, cooperative.    Intake/Output from previous day: 03/26 0701 - 03/27 0700 In: 1100 [IV Piggyback:1100] Out: 0  Intake/Output this shift: No intake/output data recorded.  LAB RESULTS: Recent Labs    05/20/17 1352 05/20/17 1359 05/21/17 0403  WBC 18.9*  --  13.9*  HGB 9.6* 10.5* 8.4*  HCT 30.8* 31.0* 27.0*  PLT 100*  --  70*   BMET Lab Results  Component Value Date   NA 136 05/21/2017   NA 138 05/20/2017   NA 137 05/20/2017   K 3.9 05/21/2017   K 4.0 05/20/2017   K 4.0 05/20/2017   CL 99 (L) 05/21/2017   CL 97 (L) 05/20/2017   CL 99 (L) 05/20/2017   CO2 23 05/21/2017   CO2 25 05/20/2017   CO2 27 05/10/2017   GLUCOSE 144 (H) 05/21/2017   GLUCOSE 60 (L) 05/20/2017   GLUCOSE 62 (L) 05/20/2017   BUN 37 (H) 05/21/2017   BUN 31 (H) 05/20/2017   BUN 28 (H) 05/20/2017   CREATININE 4.42 (H) 05/21/2017   CREATININE 4.00 (H) 05/20/2017   CREATININE 3.94 (H) 05/20/2017   CALCIUM 7.5 (L) 05/21/2017   CALCIUM 8.0 (L) 05/20/2017   CALCIUM 8.1 (L) 05/10/2017   LFT Recent Labs    05/20/17 1352  PROT 6.5  ALBUMIN 2.5*  AST 43*  ALT 18  ALKPHOS 79  BILITOT 1.2   PT/INR Lab Results  Component Value Date   INR 1.47 05/21/2017   INR 1.37 05/20/2017   INR 1.33 05/10/2017   Hepatitis Panel No results for input(s): HEPBSAG, HCVAB, HEPAIGM, HEPBIGM in the last 72 hours. C-Diff No components found for: CDIFF Lipase     Component Value Date/Time   LIPASE 17 07/02/2016 1255    Drugs of Abuse  No results found for: LABOPIA, COCAINSCRNUR, LABBENZ, AMPHETMU, THCU, LABBARB   RADIOLOGY STUDIES: Ct Abdomen Pelvis Wo Contrast  Result Date: 05/20/2017 CLINICAL DATA:  Hematemesis.  Possible sepsis. EXAM: CT CHEST, ABDOMEN AND PELVIS WITHOUT CONTRAST TECHNIQUE: Multidetector CT imaging of the chest, abdomen and pelvis was performed following the standard protocol without IV  contrast. COMPARISON:  CT scan of Jun 29, 2011. FINDINGS: CT CHEST FINDINGS Cardiovascular: Atherosclerosis of thoracic aorta is noted without aneurysm formation. Left internal jugular  catheter is noted with tip at cavoatrial junction. Normal cardiac size. No pericardial effusion. Status post aortic valve replacement. Mediastinum/Nodes: No enlarged mediastinal, hilar, or axillary lymph nodes. Thyroid gland, trachea, and esophagus demonstrate no significant findings. Lungs/Pleura: No pneumothorax is noted. Moderate right pleural effusion is noted with adjacent atelectasis or infiltrate in the right lower lobe. Minimal left pleural effusion is noted with adjacent subsegmental atelectasis. Musculoskeletal: No chest wall mass or suspicious bone lesions identified. CT ABDOMEN PELVIS FINDINGS Hepatobiliary: No focal liver abnormality is seen. No gallstones, gallbladder wall thickening, or biliary dilatation. Pancreas: No ductal dilatation is noted. 2.2 cm rounded low density is noted in pancreatic body which is slightly enlarged compared to prior exam. Spleen: Normal in size without focal abnormality. Adrenals/Urinary Tract: Adrenal glands are unremarkable. Stable left renal cyst. No hydronephrosis or renal obstruction is noted. No renal or ureteral calculi are noted. Urinary bladder is unremarkable. Stomach/Bowel: The stomach appears normal. There is no evidence of bowel obstruction or inflammation. Vascular/Lymphatic: Aortic atherosclerosis. No enlarged abdominal or pelvic lymph nodes. Reproductive: Prostate is unremarkable. Other: No abdominal wall hernia or abnormality. No abdominopelvic ascites. Musculoskeletal: No acute or significant osseous findings. IMPRESSION: 2.2 cm rounded density is noted in pancreatic body which is slightly enlarged compared to prior exam of 2013. Recommend follow up pre and post contrast MRI/MRCP or pancreatic protocol CT in 2 years. This recommendation follows ACR consensus guidelines:  Management of Incidental Pancreatic Cysts: A White Paper of the ACR Incidental Findings Committee. [place] 4174;08:144-818. Moderate right pleural effusion is noted with adjacent atelectasis or infiltrate in the right lower lobe. Minimal left pleural effusion is noted with adjacent subsegmental atelectasis. Stable left renal cyst. Aortic Atherosclerosis (ICD10-I70.0). Electronically Signed   By: Marijo Conception, M.D.   On: 05/20/2017 17:27   Ct Chest Wo Contrast  Result Date: 05/20/2017 CLINICAL DATA:  Hematemesis.  Possible sepsis. EXAM: CT CHEST, ABDOMEN AND PELVIS WITHOUT CONTRAST TECHNIQUE: Multidetector CT imaging of the chest, abdomen and pelvis was performed following the standard protocol without IV contrast. COMPARISON:  CT scan of Jun 29, 2011. FINDINGS: CT CHEST FINDINGS Cardiovascular: Atherosclerosis of thoracic aorta is noted without aneurysm formation. Left internal jugular catheter is noted with tip at cavoatrial junction. Normal cardiac size. No pericardial effusion. Status post aortic valve replacement. Mediastinum/Nodes: No enlarged mediastinal, hilar, or axillary lymph nodes. Thyroid gland, trachea, and esophagus demonstrate no significant findings. Lungs/Pleura: No pneumothorax is noted. Moderate right pleural effusion is noted with adjacent atelectasis or infiltrate in the right lower lobe. Minimal left pleural effusion is noted with adjacent subsegmental atelectasis. Musculoskeletal: No chest wall mass or suspicious bone lesions identified. CT ABDOMEN PELVIS FINDINGS Hepatobiliary: No focal liver abnormality is seen. No gallstones, gallbladder wall thickening, or biliary dilatation. Pancreas: No ductal dilatation is noted. 2.2 cm rounded low density is noted in pancreatic body which is slightly enlarged compared to prior exam. Spleen: Normal in size without focal abnormality. Adrenals/Urinary Tract: Adrenal glands are unremarkable. Stable left renal cyst. No hydronephrosis or renal  obstruction is noted. No renal or ureteral calculi are noted. Urinary bladder is unremarkable. Stomach/Bowel: The stomach appears normal. There is no evidence of bowel obstruction or inflammation. Vascular/Lymphatic: Aortic atherosclerosis. No enlarged abdominal or pelvic lymph nodes. Reproductive: Prostate is unremarkable. Other: No abdominal wall hernia or abnormality. No abdominopelvic ascites. Musculoskeletal: No acute or significant osseous findings. IMPRESSION: 2.2 cm rounded density is noted in pancreatic body which is slightly enlarged compared to prior exam of  2013. Recommend follow up pre and post contrast MRI/MRCP or pancreatic protocol CT in 2 years. This recommendation follows ACR consensus guidelines: Management of Incidental Pancreatic Cysts: A White Paper of the ACR Incidental Findings Committee. [place] 1025;85:[id number]. Moderate right pleural effusion is noted with adjacent atelectasis or infiltrate in the right lower lobe. Minimal left pleural effusion is noted with adjacent subsegmental atelectasis. Stable left renal cyst. Aortic Atherosclerosis (ICD10-I70.0). Electronically Signed   By: Marijo Conception, M.D.   On: 05/20/2017 17:27   Dg Chest Portable 1 View  Result Date: 05/20/2017 CLINICAL DATA:  Suspicion of sepsis. EXAM: PORTABLE CHEST 1 VIEW COMPARISON:  Portable chest x-ray of April 19, 2017 FINDINGS: The lungs are well-expanded. The interstitial markings remain increased bilaterally greatest on the right. The cardiac silhouette remains mildly enlarged. The pulmonary vascularity is engorged. The dual-lumen dialysis catheter tip projects at the cavoatrial junction. The sternal wires and left atrial appendage clip are stable. IMPRESSION: CHF with mild interstitial edema. One cannot exclude basilar pneumonia on the right. Electronically Signed   By: David  Martinique M.D.   On: 05/20/2017 15:05     IMPRESSION:   *  Blood on shirt. Hx epistaxis and waking up with blood on  pillow case, black stools reported at admission 2 weeks ago.  EGD entirely normal on 05/08/17, Dr Henrene Pastor suspected swallowed ENT bleeding source.    *  Anemia, normocytic, acute on chronic.    *  Pancreatic head density.    *  Bil pleural effusions, atx, fever, hypotension.  On Vanc, Zosyn.    *  ESRD.    *   A fib, previously on Copumadin, discontinued during 3/12 admission and not restarted.    *  Chronic Thrombocytopenia.      PLAN:     *  No plans to repeat and EGD so ordered renal, carb mod, soft diet.  Continue oral PPI 1 x daily.    *  Consider ENT exam, but initially would perform nasal speculum exam but equipment not available on unit 2 M.     [name]  05/21/2017, 9:21 AM Pager: [phone]   ________________________________________________________________________  Velora Heckler GI MD note:  I personally examined the patient, reviewed the data and agree with the assessment and plan described above.  He had EGD 2 weeks ago, essentially normal.  Admitted with fever, WBC, possible pneumonia, found with blood on his shirt.  He does not recall seeing any blood.  He's been coughing quite a lot.  Given very recent EGD I don't think he needs repeat testing now.  I'm most suspicious that this was actually hemoptysis in setting of coughing, pneumonia. OK that he eat.  The pancreatic cyst does not require any further testing.  Will follow, please call if he has overt GI bleeding. Continue once daily PPI orally.   Owens Loffler, MD [facility] Pager [phone]

## 2017-05-21 NOTE — ED Notes (Signed)
ED TO INPATIENT HANDOFF REPORT  Name/Age/Gender Ronald [name] 84 y.o. male  Code Status Code Status History    Date Active Date Inactive Code Status Order ID Comments User Context   05/05/2017 2148 05/10/2017 1349 Full Code [id number]  [name], MD ED   04/16/2017 1202 04/26/2017 1807 DNR [id number]  Pershing Proud, NP Inpatient   04/10/2017 1227 04/16/2017 1202 Full Code [id number]  Purohit, Konrad Dolores, MD Inpatient   03/10/2017 1130 03/13/2017 1837 Full Code [id number]  Radene Gunning, NP ED   12/26/2016 2102 01/03/2017 2118 Full Code [id number]  Nicholes Stairs, MD Inpatient   12/23/2016 1456 12/25/2016 1802 Full Code [id number]  Rondel Jumbo, PA-C ED   07/02/2016 1639 07/04/2016 2123 Full Code [id number]  Waldemar Dickens, MD Inpatient   12/14/2013 1738 12/16/2013 2009 Full Code [id number]  Mauri Pole, MD Inpatient   12/12/2013 0411 12/14/2013 1738 Full Code [id number]  Jani Gravel, MD Inpatient   04/11/2013 0356 04/15/2013 2030 Full Code [id number]  Berle Mull, MD ED   07/25/2011 1534 07/29/2011 2033 Full Code [id number]  Alda Berthold, RN Inpatient    Advance Directive Documentation     Most Recent Value  Type of Advance Directive  Healthcare Power of Attorney, Living will  Pre-existing out of facility DNR order (yellow form or pink MOST form)  -  "MOST" Form in Place?  -      Home/SNF/Other    Chief Complaint possible sepsis/GI bleed  Level of Care/Admitting Diagnosis ED Disposition    ED Disposition Condition [place]: [place] [[id number]]  Level of Care: ICU [6]  Diagnosis: Sepsis [facility]) [[id number]]  Admitting Physician: Jesus Genera [[id number]]  Attending Physician: PCCM, MD [phone]  Estimated length of stay: 3 - 4 days  Certification:: I certify this patient will need inpatient services for at least 2 midnights  PT Class (Do Not Modify): Inpatient [101]  PT Acc Code (Do Not Modify): Private [1]       Medical  History Past Medical History:  Diagnosis Date  . AS (aortic stenosis)    bovine aortic valve replacement 01/07/11  . Bladder cancer [facility])    Bladder Cancer local  . Blood transfusion    w/hip operation  . Cellulitis of left lower extremity   . Chronic atrial fibrillation ([place])   . Chronic diastolic congestive heart failure ([place])   . Claudication in peripheral vascular disease ([place]) 06/17/2011  . COPD (chronic obstructive pulmonary disease) ([place])   . Depression wife died 4 years ago.    Marland Kitchen History of stomach ulcers ~ 1951  . HTN (hypertension)   . Neuropathy due to secondary diabetes ([place])   . Peripheral vascular disease ([place]) very poor circulation legs and feet ... stents right and left legs... done in dr j. Gwenlyn Found 's office.   . Pneumonia 07/25/11   left  . Recurrent upper respiratory infection (URI)    sinusitis  . Renal artery stenosis ([place]) 2006   renal artery stent  . S/P angioplasty with stent, diamond back rotational athrectomy Prox. Rt. SFA 06/17/2011 06/17/2011  . Shortness of breath   . Thrombocytopenia due to drugs    seen by Dr Inda Merlin plts [id number] no rx  . Type II diabetes mellitus (HCC)     Allergies Allergies  Allergen Reactions  . Aspirin Anaphylaxis, Shortness Of Breath, Rash and Other (See Comments)    "broke out in white welts; red blotches neck  and face; windpipe closed; ~ 1962"  . Tape Other (See Comments)    Patient's skin is VERY THIN and tears and bruises easily!!    IV Location/Drains/Wounds Patient Lines/Drains/Airways Status   Active Line/Drains/Airways    Name:   Placement date:   Placement time:   Site:   Days:   Peripheral IV 05/05/17 Right Antecubital   05/05/17    1920    Antecubital   16   Peripheral IV 05/20/17 Right Antecubital   05/20/17    1332    Antecubital   1   Fistula / Graft Left Upper arm Arteriovenous vein graft   04/24/17    1333    Upper arm   27   Hemodialysis Catheter Left Internal jugular Double-lumen;Permanent   04/17/17     0943    Internal jugular   34   Incision (Closed) 12/14/13 Hip Left   12/14/13    1529     1254   Incision (Closed) 12/31/16 Shoulder Left   12/31/16    1634     141   Incision (Closed) 12/31/16 Arm Left   12/31/16    1636     141   Incision (Closed) 04/24/17 Arm Left   04/24/17    1346     27   Pressure Injury 05/06/17 Stage II -  Partial thickness loss of dermis presenting as a shallow open ulcer with a red, pink wound bed without slough.   05/06/17    0650     15   Wound / Incision (Open or Dehisced) 12/12/13  Arm Left;Posterior skin tear & bruising left arm to elbow from fall 12/11/13   12/12/13    0320    Arm   1256   Wound / Incision (Open or Dehisced) 07/02/16 Non-pressure wound Leg Left small scab like wound on left shin   07/02/16    1500    Leg   323          Labs/Imaging Results for orders placed or performed during the hospital encounter of 05/20/17 (from the past 48 hour(s))  Comprehensive metabolic panel     Status: Abnormal   Collection Time: 05/20/17  1:52 PM  Result Value Ref Range   Sodium 137 135 - 145 mmol/L   Potassium 4.0 3.5 - 5.1 mmol/L   Chloride 99 (L) 101 - 111 mmol/L   CO2 25 22 - 32 mmol/L   Glucose, Bld 62 (L) 65 - 99 mg/dL   BUN 28 (H) 6 - 20 mg/dL   Creatinine, Ser 3.94 (H) 0.61 - 1.24 mg/dL   Calcium 8.0 (L) 8.9 - 10.3 mg/dL   Total Protein 6.5 6.5 - 8.1 g/dL   Albumin 2.5 (L) 3.5 - 5.0 g/dL   AST 43 (H) 15 - 41 U/L   ALT 18 17 - 63 U/L   Alkaline Phosphatase 79 38 - 126 U/L   Total Bilirubin 1.2 0.3 - 1.2 mg/dL   GFR calc non Af Amer 12 (L) >60 mL/min   GFR calc Af Amer 14 (L) >60 mL/min    Comment: (NOTE) The eGFR has been calculated using the CKD EPI equation. This calculation has not been validated in all clinical situations. eGFR's persistently <60 mL/min signify possible Chronic Kidney Disease.    Anion gap 13 5 - 15    Comment: Performed at [facility], [place] [address], [place], [place] [address]  CBC with  Differential     Status: Abnormal  Collection Time: 05/20/17  1:52 PM  Result Value Ref Range   WBC 18.9 (H) 4.0 - 10.5 K/uL   RBC 3.11 (L) 4.22 - 5.81 MIL/uL   Hemoglobin 9.6 (L) 13.0 - 17.0 g/dL   HCT 30.8 (L) 39.0 - 52.0 %   MCV 99.0 78.0 - 100.0 fL   MCH 30.9 26.0 - 34.0 pg   MCHC 31.2 30.0 - 36.0 g/dL   RDW 19.4 (H) 11.5 - 15.5 %   Platelets 100 (L) 150 - 400 K/uL    Comment: REPEATED TO VERIFY SPECIMEN CHECKED FOR CLOTS PLATELET COUNT CONFIRMED BY SMEAR    Neutrophils Relative % 93 %   Neutro Abs 17.6 (H) 1.7 - 7.7 K/uL   Lymphocytes Relative 3 %   Lymphs Abs 0.5 (L) 0.7 - 4.0 K/uL   Monocytes Relative 4 %   Monocytes Absolute 0.7 0.1 - 1.0 K/uL   Eosinophils Relative 0 %   Eosinophils Absolute 0.0 0.0 - 0.7 K/uL   Basophils Relative 0 %   Basophils Absolute 0.0 0.0 - 0.1 K/uL    Comment: Performed at [facility], [place] [address] [place], [place] [address]  Protime-INR     Status: Abnormal   Collection Time: 05/20/17  1:52 PM  Result Value Ref Range   Prothrombin Time 16.8 (H) 11.4 - 15.2 seconds   INR 1.37     Comment: Performed at [facility], [place] [address] [place], [place] [address]  I-Stat Troponin, ED (not at [facility])     Status: None   Collection Time: 05/20/17  1:58 PM  Result Value Ref Range   Troponin i, poc 0.08 0.00 - 0.08 ng/mL   Comment 3            Comment: Due to the release kinetics of cTnI, a negative result within the first hours of the onset of symptoms does not rule out myocardial infarction with certainty. If myocardial infarction is still suspected, repeat the test at appropriate intervals.   I-Stat CG4 Lactic Acid, ED     Status: Abnormal   Collection Time: 05/20/17  1:59 PM  Result Value Ref Range   Lactic Acid, Venous 2.05 (HH) 0.5 - 1.9 mmol/L   Comment NOTIFIED PHYSICIAN   I-Stat Chem 8, ED     Status: Abnormal   Collection Time: 05/20/17  1:59 PM  Result Value Ref Range   Sodium 138 135 - 145  mmol/L   Potassium 4.0 3.5 - 5.1 mmol/L   Chloride 97 (L) 101 - 111 mmol/L   BUN 31 (H) 6 - 20 mg/dL   Creatinine, Ser 4.00 (H) 0.61 - 1.24 mg/dL   Glucose, Bld 60 (L) 65 - 99 mg/dL   Calcium, Ion 1.03 (L) 1.15 - 1.40 mmol/L   TCO2 29 22 - 32 mmol/L   Hemoglobin 10.5 (L) 13.0 - 17.0 g/dL   HCT 31.0 (L) 39.0 - 52.0 %  POC occult blood, ED     Status: Abnormal   Collection Time: 05/20/17  2:02 PM  Result Value Ref Range   Fecal Occult Bld POSITIVE (A) NEGATIVE  I-Stat CG4 Lactic Acid, ED     Status: None   Collection Time: 05/20/17  4:22 PM  Result Value Ref Range   Lactic Acid, Venous 1.43 0.5 - 1.9 mmol/L   Ct Abdomen Pelvis Wo Contrast  Result Date: 05/20/2017 CLINICAL DATA:  Hematemesis.  Possible sepsis. EXAM: CT CHEST, ABDOMEN AND PELVIS WITHOUT CONTRAST TECHNIQUE: Multidetector CT imaging  of the chest, abdomen and pelvis was performed following the standard protocol without IV contrast. COMPARISON:  CT scan of Jun 29, 2011. FINDINGS: CT CHEST FINDINGS Cardiovascular: Atherosclerosis of thoracic aorta is noted without aneurysm formation. Left internal jugular catheter is noted with tip at cavoatrial junction. Normal cardiac size. No pericardial effusion. Status post aortic valve replacement. Mediastinum/Nodes: No enlarged mediastinal, hilar, or axillary lymph nodes. Thyroid gland, trachea, and esophagus demonstrate no significant findings. Lungs/Pleura: No pneumothorax is noted. Moderate right pleural effusion is noted with adjacent atelectasis or infiltrate in the right lower lobe. Minimal left pleural effusion is noted with adjacent subsegmental atelectasis. Musculoskeletal: No chest wall mass or suspicious bone lesions identified. CT ABDOMEN PELVIS FINDINGS Hepatobiliary: No focal liver abnormality is seen. No gallstones, gallbladder wall thickening, or biliary dilatation. Pancreas: No ductal dilatation is noted. 2.2 cm rounded low density is noted in pancreatic body which is slightly  enlarged compared to prior exam. Spleen: Normal in size without focal abnormality. Adrenals/Urinary Tract: Adrenal glands are unremarkable. Stable left renal cyst. No hydronephrosis or renal obstruction is noted. No renal or ureteral calculi are noted. Urinary bladder is unremarkable. Stomach/Bowel: The stomach appears normal. There is no evidence of bowel obstruction or inflammation. Vascular/Lymphatic: Aortic atherosclerosis. No enlarged abdominal or pelvic lymph nodes. Reproductive: Prostate is unremarkable. Other: No abdominal wall hernia or abnormality. No abdominopelvic ascites. Musculoskeletal: No acute or significant osseous findings. IMPRESSION: 2.2 cm rounded density is noted in pancreatic body which is slightly enlarged compared to prior exam of 2013. Recommend follow up pre and post contrast MRI/MRCP or pancreatic protocol CT in 2 years. This recommendation follows ACR consensus guidelines: Management of Incidental Pancreatic Cysts: A White Paper of the ACR Incidental Findings Committee. [place] 0160;10:932-355. Moderate right pleural effusion is noted with adjacent atelectasis or infiltrate in the right lower lobe. Minimal left pleural effusion is noted with adjacent subsegmental atelectasis. Stable left renal cyst. Aortic Atherosclerosis (ICD10-I70.0). Electronically Signed   By: Marijo Conception, M.D.   On: 05/20/2017 17:27   Ct Chest Wo Contrast  Result Date: 05/20/2017 CLINICAL DATA:  Hematemesis.  Possible sepsis. EXAM: CT CHEST, ABDOMEN AND PELVIS WITHOUT CONTRAST TECHNIQUE: Multidetector CT imaging of the chest, abdomen and pelvis was performed following the standard protocol without IV contrast. COMPARISON:  CT scan of Jun 29, 2011. FINDINGS: CT CHEST FINDINGS Cardiovascular: Atherosclerosis of thoracic aorta is noted without aneurysm formation. Left internal jugular catheter is noted with tip at cavoatrial junction. Normal cardiac size. No pericardial effusion. Status post aortic  valve replacement. Mediastinum/Nodes: No enlarged mediastinal, hilar, or axillary lymph nodes. Thyroid gland, trachea, and esophagus demonstrate no significant findings. Lungs/Pleura: No pneumothorax is noted. Moderate right pleural effusion is noted with adjacent atelectasis or infiltrate in the right lower lobe. Minimal left pleural effusion is noted with adjacent subsegmental atelectasis. Musculoskeletal: No chest wall mass or suspicious bone lesions identified. CT ABDOMEN PELVIS FINDINGS Hepatobiliary: No focal liver abnormality is seen. No gallstones, gallbladder wall thickening, or biliary dilatation. Pancreas: No ductal dilatation is noted. 2.2 cm rounded low density is noted in pancreatic body which is slightly enlarged compared to prior exam. Spleen: Normal in size without focal abnormality. Adrenals/Urinary Tract: Adrenal glands are unremarkable. Stable left renal cyst. No hydronephrosis or renal obstruction is noted. No renal or ureteral calculi are noted. Urinary bladder is unremarkable. Stomach/Bowel: The stomach appears normal. There is no evidence of bowel obstruction or inflammation. Vascular/Lymphatic: Aortic atherosclerosis. No enlarged abdominal or pelvic lymph nodes.  Reproductive: Prostate is unremarkable. Other: No abdominal wall hernia or abnormality. No abdominopelvic ascites. Musculoskeletal: No acute or significant osseous findings. IMPRESSION: 2.2 cm rounded density is noted in pancreatic body which is slightly enlarged compared to prior exam of 2013. Recommend follow up pre and post contrast MRI/MRCP or pancreatic protocol CT in 2 years. This recommendation follows ACR consensus guidelines: Management of Incidental Pancreatic Cysts: A White Paper of the ACR Incidental Findings Committee. [place] 1884;16:606-301. Moderate right pleural effusion is noted with adjacent atelectasis or infiltrate in the right lower lobe. Minimal left pleural effusion is noted with adjacent subsegmental  atelectasis. Stable left renal cyst. Aortic Atherosclerosis (ICD10-I70.0). Electronically Signed   By: Marijo Conception, M.D.   On: 05/20/2017 17:27   Dg Chest Portable 1 View  Result Date: 05/20/2017 CLINICAL DATA:  Suspicion of sepsis. EXAM: PORTABLE CHEST 1 VIEW COMPARISON:  Portable chest x-ray of April 19, 2017 FINDINGS: The lungs are well-expanded. The interstitial markings remain increased bilaterally greatest on the right. The cardiac silhouette remains mildly enlarged. The pulmonary vascularity is engorged. The dual-lumen dialysis catheter tip projects at the cavoatrial junction. The sternal wires and left atrial appendage clip are stable. IMPRESSION: CHF with mild interstitial edema. One cannot exclude basilar pneumonia on the right. Electronically Signed   By: David  Martinique M.D.   On: 05/20/2017 15:05    Pending Labs Unresulted Labs (From admission, onward)   Start     Ordered   05/20/17 1759  Culture, expectorated sputum-assessment  Once,   R     05/20/17 1758   05/20/17 1758  Culture, Urine  Once,   R     05/20/17 1757   05/20/17 1336  Culture, blood (Routine x 2)  BLOOD CULTURE X 2,   STAT     05/20/17 1336   05/20/17 1336  Urinalysis, Routine w reflex microscopic  STAT,   STAT     05/20/17 1336      Vitals/Pain Today's Vitals   05/21/17 0030 05/21/17 0045 05/21/17 0115 05/21/17 0130  BP: (!) 106/49 (!) 98/48 (!) 103/44 (!) 111/57  Pulse: 78 78 79 77  Resp: _0 Temp:      TempSrc:      SpO2: 100% 100% 98% 100%  PainSc:        Isolation Precautions No active isolations  Medications Medications  piperacillin-tazobactam (ZOSYN) IVPB 3.375 g (3.375 g Intravenous New Bag/Given 05/21/17 0029)  acetaminophen (TYLENOL) suppository 650 mg (650 mg Rectal Given 05/20/17 1422)  piperacillin-tazobactam (ZOSYN) IVPB 2.25 g (0 g Intravenous Stopped 05/20/17 1618)  vancomycin (VANCOCIN) 1,500 mg in sodium chloride 0.9 % 500 mL IVPB (0 mg Intravenous Stopped 05/20/17  1845)  dextrose 5 % and 0.45% NaCl 5-0.45 % bolus 500 mL (0 mLs Intravenous Stopped 05/20/17 1709)  hydrocortisone sodium succinate (SOLU-CORTEF) 100 MG injection 100 mg (100 mg Intravenous Given 05/20/17 1644)    Mobility non-ambulatory

## 2017-05-21 NOTE — H&P (Signed)
PULMONARY / CRITICAL CARE MEDICINE   Name: Ronald [name] MRN: [id number] DOB: [birth year]    ADMISSION DATE:  05/20/2017 CONSULTATION DATE:  3/26  REFERRING MD:  Dr. Myrene Buddy EDP  CHIEF COMPLAINT:  Sepsis  BRIEF  Patient is a poor historian and very hard of hearing and difficult to obtain history. Therefore history has been obtained from chart review. 84 year old male with PMH as below, which is significant for smoker since 84 yrs old, ESRD on HD (only for 4-6 weeks) MWF, AF not on AC, COPD, diastolic CHF, DM, recent GI bleed and PVD. He was in his usual state of health until 3/25 when family visited him at [facility] and he was "not feeling well". He received HD and was lethargic before and after. He complained of being very tired and fatigued. Then 3/26 family went to visit him again and noticed blood all over the front of his shirt, thought to be emesis. EMS was called and he was transported to [facility] ED. Upon arrival to ED he was noted to be febrile 103.3,  Tachypneic, and hypotensive. He was started on O2, IVF, and ABX. CT concerning for pneumonia. Based on hypotension and concern for CRRT need he was transferred to [facility] ICU.    STUDIES:  CT chest 3/26 > 2.2 cm rounded density is noted in pancreatic body which is slightly enlarged compared to prior exam of 2013 CT abdomen 3/26 > Moderate right pleural effusion is noted with adjacent atelectasis or infiltrate in the right lower lobe. Minimal left pleural effusion is noted with adjacent subsegmental atelectasis.  CULTURES: Blood 3/26 > Urine 3/26 > Sputum 3/26 >  ANTIBIOTICS: Clindamycin 3/26 x 1  Zosyn 3/26 > Vancomycin 3/26 >  SIGNIFICANT EVENTS: 3/25 Admitted   LINES/TUBES: PIV  TDC left chest LUE AVF  events 05/21/2017 - >  Unable to fully/accurately obtain as patient is a very poor historian.  No present complaints. Arrived to 2M01, CBG 49.      SUBJECTIVE/OVERNIGHT/INTERVAL HX  05/21/2017 -> daughter at bedside.  Says wheel chair bound x Oct 2018 due to broken arm. Last 5 weeks on HD; anmd off anticoagulation. Earlier this month GI bleed admit - per daughter endoscopy negative. Now found with blood on shirt. In ICU - not on vent, no active bleeding. Not on pressors. He is HOH. Daughter says patient insists on full code. Daughter says would be appropriate for DNR but he is afraid of dying. Daughter says patient did not listen to palliative advice against HD . He still smokes. 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  VITAL SIGNS: BP 134/63 (BP Location: Right Arm)   Pulse 81   Temp (!) 97.4 F (36.3 C) (Oral)   Resp (!) 26   Wt 70.3 kg (154 lb 15.7 oz)   SpO2 100%   BMI 21.62 kg/m   HEMODYNAMICS:    VENTILATOR SETTINGS:    INTAKE / OUTPUT: I/O last 3 completed shifts: In: 1100 [IV Piggyback:1100] Out: 0   PHYSICAL EXAMINATION:  General Appearance:    Looks chronic ill, frail, in bed  Head:    Normocephalic, without obvious abnormality, atraumatic  Eyes:    PERRL - yes, conjunctiva/corneas - clear      Ears:    Normal external ear canals, both ears  Nose:   NG tube - no  Throat:  ETT TUBE - no , OG tube - no  Neck:   Supple,  No  enlargement/tenderness/nodules     Lungs:     Clear to auscultation bilaterally, V  Chest wall:    No deformity  Heart:    S1 and S2 normal, no murmur, CVP - no.  Pressors - no  Abdomen:     Soft, no masses, no organomegaly  Genitalia:    Not done  Rectal:   not done  Extremities:   Extremities- intact     Skin:   Intact in exposed areas .     Neurologic:   Sedation - none -> RASS - o . Moves all 4s - yes. CAM-ICU - Not test . Orientation - not tested. Hard of HEaring +     LABS:  BMET Recent Labs  Lab 05/20/17 1352 05/20/17 1359 05/21/17 0403  NA 137 138 136  K 4.0 4.0 3.9  CL 99* 97* 99*  CO2 25  --  23  BUN 28* 31* 37*  CREATININE 3.94* 4.00* 4.42*  GLUCOSE 62* 60* 144*    Electrolytes Recent Labs  Lab  05/20/17 1352 05/21/17 0403  CALCIUM 8.0* 7.5*  MG  --  1.9  PHOS  --  5.7*    CBC Recent Labs  Lab 05/20/17 1352 05/20/17 1359 05/21/17 0403  WBC 18.9*  --  13.9*  HGB 9.6* 10.5* 8.4*  HCT 30.8* 31.0* 27.0*  PLT 100*  --  70*    Coag's Recent Labs  Lab 05/20/17 1352 05/21/17 0403  INR 1.37 1.47    Sepsis Markers Recent Labs  Lab 05/20/17 1359 05/20/17 1622  LATICACIDVEN 2.05* 1.43    ABG No results for input(s): PHART, PCO2ART, PO2ART in the last 168 hours.  Liver Enzymes Recent Labs  Lab 05/20/17 1352  AST 43*  ALT 18  ALKPHOS 79  BILITOT 1.2  ALBUMIN 2.5*    Cardiac Enzymes No results for input(s): TROPONINI, PROBNP in the last 168 hours.  Glucose Recent Labs  Lab 05/21/17 0238 05/21/17 0308 05/21/17 0342  GLUCAP 49* 145* 147*    Imaging Ct Abdomen Pelvis Wo Contrast  Result Date: 05/20/2017 CLINICAL DATA:  Hematemesis.  Possible sepsis. EXAM: CT CHEST, ABDOMEN AND PELVIS WITHOUT CONTRAST TECHNIQUE: Multidetector CT imaging of the chest, abdomen and pelvis was performed following the standard protocol without IV contrast. COMPARISON:  CT scan of Jun 29, 2011. FINDINGS: CT CHEST FINDINGS Cardiovascular: Atherosclerosis of thoracic aorta is noted without aneurysm formation. Left internal jugular catheter is noted with tip at cavoatrial junction. Normal cardiac size. No pericardial effusion. Status post aortic valve replacement. Mediastinum/Nodes: No enlarged mediastinal, hilar, or axillary lymph nodes. Thyroid gland, trachea, and esophagus demonstrate no significant findings. Lungs/Pleura: No pneumothorax is noted. Moderate right pleural effusion is noted with adjacent atelectasis or infiltrate in the right lower lobe. Minimal left pleural effusion is noted with adjacent subsegmental atelectasis. Musculoskeletal: No chest wall mass or suspicious bone lesions identified. CT ABDOMEN PELVIS FINDINGS Hepatobiliary: No focal liver abnormality is seen. No  gallstones, gallbladder wall thickening, or biliary dilatation. Pancreas: No ductal dilatation is noted. 2.2 cm rounded low density is noted in pancreatic body which is slightly enlarged compared to prior exam. Spleen: Normal in size without focal abnormality. Adrenals/Urinary Tract: Adrenal glands are unremarkable. Stable left renal cyst. No hydronephrosis or renal obstruction is noted. No renal or ureteral calculi are noted. Urinary bladder is unremarkable. Stomach/Bowel: The stomach appears normal. There is no evidence of bowel obstruction or inflammation. Vascular/Lymphatic: Aortic atherosclerosis. No enlarged abdominal or pelvic lymph nodes. Reproductive: Prostate is unremarkable.  Other: No abdominal wall hernia or abnormality. No abdominopelvic ascites. Musculoskeletal: No acute or significant osseous findings. IMPRESSION: 2.2 cm rounded density is noted in pancreatic body which is slightly enlarged compared to prior exam of 2013. Recommend follow up pre and post contrast MRI/MRCP or pancreatic protocol CT in 2 years. This recommendation follows ACR consensus guidelines: Management of Incidental Pancreatic Cysts: A White Paper of the ACR Incidental Findings Committee. [place] 6433;29:518-841. Moderate right pleural effusion is noted with adjacent atelectasis or infiltrate in the right lower lobe. Minimal left pleural effusion is noted with adjacent subsegmental atelectasis. Stable left renal cyst. Aortic Atherosclerosis (ICD10-I70.0). Electronically Signed   By: Marijo Conception, M.D.   On: 05/20/2017 17:27   Ct Chest Wo Contrast  Result Date: 05/20/2017 CLINICAL DATA:  Hematemesis.  Possible sepsis. EXAM: CT CHEST, ABDOMEN AND PELVIS WITHOUT CONTRAST TECHNIQUE: Multidetector CT imaging of the chest, abdomen and pelvis was performed following the standard protocol without IV contrast. COMPARISON:  CT scan of Jun 29, 2011. FINDINGS: CT CHEST FINDINGS Cardiovascular: Atherosclerosis of thoracic aorta  is noted without aneurysm formation. Left internal jugular catheter is noted with tip at cavoatrial junction. Normal cardiac size. No pericardial effusion. Status post aortic valve replacement. Mediastinum/Nodes: No enlarged mediastinal, hilar, or axillary lymph nodes. Thyroid gland, trachea, and esophagus demonstrate no significant findings. Lungs/Pleura: No pneumothorax is noted. Moderate right pleural effusion is noted with adjacent atelectasis or infiltrate in the right lower lobe. Minimal left pleural effusion is noted with adjacent subsegmental atelectasis. Musculoskeletal: No chest wall mass or suspicious bone lesions identified. CT ABDOMEN PELVIS FINDINGS Hepatobiliary: No focal liver abnormality is seen. No gallstones, gallbladder wall thickening, or biliary dilatation. Pancreas: No ductal dilatation is noted. 2.2 cm rounded low density is noted in pancreatic body which is slightly enlarged compared to prior exam. Spleen: Normal in size without focal abnormality. Adrenals/Urinary Tract: Adrenal glands are unremarkable. Stable left renal cyst. No hydronephrosis or renal obstruction is noted. No renal or ureteral calculi are noted. Urinary bladder is unremarkable. Stomach/Bowel: The stomach appears normal. There is no evidence of bowel obstruction or inflammation. Vascular/Lymphatic: Aortic atherosclerosis. No enlarged abdominal or pelvic lymph nodes. Reproductive: Prostate is unremarkable. Other: No abdominal wall hernia or abnormality. No abdominopelvic ascites. Musculoskeletal: No acute or significant osseous findings. IMPRESSION: 2.2 cm rounded density is noted in pancreatic body which is slightly enlarged compared to prior exam of 2013. Recommend follow up pre and post contrast MRI/MRCP or pancreatic protocol CT in 2 years. This recommendation follows ACR consensus guidelines: Management of Incidental Pancreatic Cysts: A White Paper of the ACR Incidental Findings Committee. [place]  6606;30:160-109. Moderate right pleural effusion is noted with adjacent atelectasis or infiltrate in the right lower lobe. Minimal left pleural effusion is noted with adjacent subsegmental atelectasis. Stable left renal cyst. Aortic Atherosclerosis (ICD10-I70.0). Electronically Signed   By: Marijo Conception, M.D.   On: 05/20/2017 17:27   Dg Chest Portable 1 View  Result Date: 05/20/2017 CLINICAL DATA:  Suspicion of sepsis. EXAM: PORTABLE CHEST 1 VIEW COMPARISON:  Portable chest x-ray of April 19, 2017 FINDINGS: The lungs are well-expanded. The interstitial markings remain increased bilaterally greatest on the right. The cardiac silhouette remains mildly enlarged. The pulmonary vascularity is engorged. The dual-lumen dialysis catheter tip projects at the cavoatrial junction. The sternal wires and left atrial appendage clip are stable. IMPRESSION: CHF with mild interstitial edema. One cannot exclude basilar pneumonia on the right. Electronically Signed  By: David  Martinique M.D.   On: 05/20/2017 15:05   DISCUSSION: 57 yoM ESRD pt presenting with SIRS, hypotension concerning for pneumonia and episode of hematemesis at SNF.  Transferred to [facility] for concern for need of CRRT however BP has been stable over the last 12 hours.   ASSESSMENT / PLAN:  PULMONARY A: R/o HCAP, ? RLL infiltrate  Hx COPD, tobacco abuse since 10 yrs of age  73/27/2019 - no distress . Has RLL effusion   P:   Supplemental O2 prn > 90-95% See ID  Duonebs prn q 6 hr Continue Anoro daily  Monitor effusion with HD; thora if in respiroaty distress  CARDIOVASCULAR A:  SIRS, hypotension fluid responsive  Hx PAF not on AC due to recent UGIB, diastolic HF, PVD, HTN  - currently normotensive, maintained  P:  Tele monitoring Goal MAP > 60/ renal patient   RENAL A:   ESRD on HD (newly started 2/21, last 3/25) Lactic acidosis - resolved - currently no indications for immediate CRRT  P:   Renal called Dr Upon for MWF  HD replace electrolytes as indicated Avoid nephrotoxic agents, ensure adequate renal perfusion  GASTROINTESTINAL A:   hematemesis - no episodes since SNF - normal EGD 3/14 after melena/ ?suspected hematemesis after swallowed blood from epistaxis    - 05/21/2017 - unclear if hemoptysis v hematamesis but in PCCM view this is hematemesis with wretching P:   NPO but for meds GI - recall  PPI BID Trend CBC/ monitor for bleeding   HEMATOLOGIC A:   Anemia -  Leukocytosis  P:  CBC and coags now and Trend SCDs only   INFECTIOUS  Results for orders placed or performed during the hospital encounter of 05/20/17  Culture, blood (Routine x 2)     Status: None (Preliminary result)   Collection Time: 05/20/17  1:36 PM  Result Value Ref Range Status   Specimen Description   Final    BLOOD LEFT HAND Performed at [facility], [place] [address] [place], [place] [address]    Special Requests   Final    BOTTLES DRAWN AEROBIC AND ANAEROBIC Blood Culture adequate volume Performed at [facility] [address], [place], [place] [address]    Culture  Setup Time   Final    GRAM POSITIVE COCCI AEROBIC BOTTLE ONLY Organism ID to follow CRITICAL RESULT CALLED TO, READ BACK BY AND VERIFIED WITH: Shellee Milo [facility] 05/21/17 0427 JDW Performed at [facility], [address], [place], [place] [address]    Culture PENDING  Incomplete   Report Status PENDING  Incomplete  Blood Culture ID Panel (Reflexed)     Status: Abnormal   Collection Time: 05/20/17  1:36 PM  Result Value Ref Range Status   Enterococcus species NOT DETECTED NOT DETECTED Final   Listeria monocytogenes NOT DETECTED NOT DETECTED Final   Staphylococcus species DETECTED (A) NOT DETECTED Final    Comment: CRITICAL RESULT CALLED TO, READ BACK BY AND VERIFIED WITH: L SEAY PHARMD 05/21/17 0427 JDW    Staphylococcus aureus DETECTED (A) NOT DETECTED Final    Comment: Methicillin (oxacillin)  susceptible Staphylococcus aureus (MSSA). Preferred therapy is anti staphylococcal beta lactam antibiotic (Cefazolin or Nafcillin), unless clinically contraindicated. CRITICAL RESULT CALLED TO, READ BACK BY AND VERIFIED WITH: L SEAY PHARMD 05/21/17 0427 JDW    Methicillin resistance NOT DETECTED NOT DETECTED Final   Streptococcus species NOT DETECTED NOT DETECTED Final   Streptococcus agalactiae NOT DETECTED  NOT DETECTED Final   Streptococcus pneumoniae NOT DETECTED NOT DETECTED Final   Streptococcus pyogenes NOT DETECTED NOT DETECTED Final   Acinetobacter baumannii NOT DETECTED NOT DETECTED Final   Enterobacteriaceae species NOT DETECTED NOT DETECTED Final   Enterobacter cloacae complex NOT DETECTED NOT DETECTED Final   Escherichia coli NOT DETECTED NOT DETECTED Final   Klebsiella oxytoca NOT DETECTED NOT DETECTED Final   Klebsiella pneumoniae NOT DETECTED NOT DETECTED Final   Proteus species NOT DETECTED NOT DETECTED Final   Serratia marcescens NOT DETECTED NOT DETECTED Final   Haemophilus influenzae NOT DETECTED NOT DETECTED Final   Neisseria meningitidis NOT DETECTED NOT DETECTED Final   Pseudomonas aeruginosa NOT DETECTED NOT DETECTED Final   Candida albicans NOT DETECTED NOT DETECTED Final   Candida glabrata NOT DETECTED NOT DETECTED Final   Candida krusei NOT DETECTED NOT DETECTED Final   Candida parapsilosis NOT DETECTED NOT DETECTED Final   Candida tropicalis NOT DETECTED NOT DETECTED Final    Comment: Performed at [facility], [place] [address] [place], [place] [address]  Culture, blood (Routine x 2)     Status: None (Preliminary result)   Collection Time: 05/20/17  1:50 PM  Result Value Ref Range Status   Specimen Description   Final    BLOOD RIGHT ANTECUBITAL Performed at [facility] [address], [place], [place] [address]    Special Requests   Final    BOTTLES DRAWN AEROBIC AND ANAEROBIC Blood Culture adequate volume Performed at [facility] [address], [place], [place] [address]    Culture  Setup Time   Final    GRAM POSITIVE COCCI ANAEROBIC BOTTLE ONLY CRITICAL RESULT CALLED TO, READ BACK BY AND VERIFIED WITH: Shellee Milo [facility] 05/21/17 0427 JDW Performed at [facility], [address] [place], [place] [address]    Culture PENDING  Incomplete   Report Status PENDING  Incomplete    A:   R/o HCAP/ RLL  P:   Continue vanc/ zosyn [place] as able with culture data Trend WBC/ fever curve  ENDOCRINE A:   DM Hypoglycemia P:   CBG q 4 SSI if needed Assess cortisol r/o adrenal insufficiency   NEUROLOGIC A:   Acute encephalopathy -likely in the setting of sepsis, now resolved  - HOH but oriented 05/21/2017  P:   monitor   FAMILY  - Updates.  Patient reports his son is is HPOA. Diaghter at bedside updated   GLOBAL Full Code. Daughter says he declines dNR. She will have pastor talk to patient. Declined palliative care because he did not listen to them. Says patient is forward lookng with will to live. Started a new company at age 46  DISPO Move to renal floor   daughte rupdated   Dr. Brand Males, M.D., [facility].C.P Pulmonary and Critical Care Medicine Staff Physician, [place] Director - Interstitial Lung Disease  Program  Pulmonary [place] [facility] [place], [place], [address]  Pager: [phone], If no answer or between  15:00h - 7:00h: call [phone] Telephone: [phone]

## 2017-05-21 NOTE — ED Notes (Signed)
Patient belongings bag found by tech after carelink left with the patient. 2 clothing items found in bag. Bag handed to security to be returned to patient at [facility]. Nurse at [facility] aware.

## 2017-05-21 NOTE — Progress Notes (Signed)
HD tx completed @ 2300 w/o problem, UF goal met, blood rinsed back, VSS w/ low bp but higher than he arrived to me w/, attempted to call report to prim RN but she was unavailable, let Maudie Mercury know that pt would most likely arrive back to room prior to prim RN getting report as transport had just arrived to take him back, awaiting return call from prim RN

## 2017-05-21 NOTE — Consult Note (Signed)
Hospital Consult    Reason for Consult:  Dr. Hollie Salk Referring Physician:  Systemic infection with indwelling catheter and new graft MRN #:  [id number]  History of Present Illness: This is a 84 y.o. male with recently placed tdc and left arm av graft to initiate dialysis. Now admitted with mssa bacteremia. Tunneled catheter not placed by [facility] but is recent. He is confused on exam and unable to detail events surrounding admission thus all info is from chart review.  Past Medical History:  Diagnosis Date  . AS (aortic stenosis)    bovine aortic valve replacement 01/07/11  . Bladder cancer [facility])    Bladder Cancer local  . Blood transfusion    w/hip operation  . Cellulitis of left lower extremity   . Chronic atrial fibrillation ([place])   . Chronic diastolic congestive heart failure ([place])   . Claudication in peripheral vascular disease ([place]) 06/17/2011  . COPD (chronic obstructive pulmonary disease) ([place])   . Depression wife died 4 years ago.    Marland Kitchen History of stomach ulcers ~ 1951  . HTN (hypertension)   . Neuropathy due to secondary diabetes ([place])   . Peripheral vascular disease ([place]) very poor circulation legs and feet ... stents right and left legs... done in dr j. Gwenlyn Found 's office.   . Pneumonia 07/25/11   left  . Recurrent upper respiratory infection (URI)    sinusitis  . Renal artery stenosis ([place]) 2006   renal artery stent  . S/P angioplasty with stent, diamond back rotational athrectomy Prox. Rt. SFA 06/17/2011 06/17/2011  . Shortness of breath   . Thrombocytopenia due to drugs    seen by Dr Inda Merlin plts [id number] no rx  . Type II diabetes mellitus ([place])     Past Surgical History:  Procedure Laterality Date  . ABDOMINAL ANGIOGRAM  06/17/2011   Procedure: ABDOMINAL ANGIOGRAM;  Surgeon: Lorretta Harp, MD;  Location: [facility] CATH LAB;  Service: Cardiovascular;;  . AORTIC VALVE REPLACEMENT  01/07/2011   Procedure: AORTIC VALVE REPLACEMENT (AVR);  Surgeon: Grace Isaac, MD;   Location: [place];  Service: Open Heart Surgery;  Laterality: N/A;; magna-ease bovine 97mm bioprosthesis  . ATHERECTOMY N/A 06/17/2011   Procedure: ATHERECTOMY;  Surgeon: Lorretta Harp, MD;  Location: [facility] CATH LAB;  Service: Cardiovascular;  Laterality: N/A;  . AV FISTULA PLACEMENT Left 04/24/2017   Procedure: INSERTION OF ARTERIOVENOUS (AV) GORE-TEX GRAFT ARM;  Surgeon: Conrad Lake Kathryn, MD;  Location: [place];  Service: Vascular;  Laterality: Left;  . CARDIAC CATHETERIZATION  11/19/10   normal coronaries, mod AS, 75% l RAS  . CARDIOVERSION  04/03/2011   Procedure: CARDIOVERSION;  Surgeon: Leonie Man, MD;  Location: [place];  Service: Cardiovascular;  Laterality: N/A;  . CATARACT EXTRACTION W/ INTRAOCULAR LENS  IMPLANT, BILATERAL  ~ 2007  . CHOLECYSTECTOMY    . ESOPHAGOGASTRODUODENOSCOPY N/A 05/08/2017   Procedure: ESOPHAGOGASTRODUODENOSCOPY (EGD);  Surgeon: Irene Shipper, MD;  Location: [facility] ENDOSCOPY;  Service: Endoscopy;  Laterality: N/A;  . FEMUR IM NAIL  06/27/2011   Procedure: INTRAMEDULLARY (IM) NAIL FEMORAL;  Surgeon: Marin Shutter, MD;  Location: [facility] ORS;  Service: Orthopedics;  Laterality: Right;  . FEMUR IM NAIL Left 12/14/2013   Procedure: INTRAMEDULLARY (IM) NAIL FEMORAL;  Surgeon: Mauri Pole, MD;  Location: [facility] ORS;  Service: Orthopedics;  Laterality: Left;  . FRACTURE SURGERY    . IR FLUORO GUIDE CV LINE LEFT  04/17/2017  . IR US GUIDE VASC ACCESS LEFT  04/17/2017  .  LOWER EXTREMITY ANGIOGRAM  06/17/2011   diamondback orbital rotational and cutting balloon atherectomy of the prox R SFA  . LOWER EXTREMITY ANGIOGRAM Bilateral 06/17/2011   Procedure: LOWER EXTREMITY ANGIOGRAM;  Surgeon: Lorretta Harp, MD;  Location: [facility] CATH LAB;  Service: Cardiovascular;  Laterality: Bilateral;  . PERIPHERAL ARTERIAL STENT GRAFT     2006 left anf right illiac stents Dr Deon Pilling  . RENAL ARTERY STENT  2006   "I believe"  . REVERSE SHOULDER ARTHROPLASTY Left 12/31/2016   Procedure: LEFT REVERSE SHOULDER  ARTHROPLASTY;  Surgeon: Nicholes Stairs, MD;  Location: [place];  Service: Orthopedics;  Laterality: Left;  . TONSILLECTOMY AND ADENOIDECTOMY     "when I was a kid"    Allergies  Allergen Reactions  . Aspirin Anaphylaxis, Shortness Of Breath, Rash and Other (See Comments)    "broke out in white welts; red blotches neck and face; windpipe closed; ~ 1962"  . Tape Other (See Comments)    Patient's skin is VERY THIN and tears and bruises easily!!    Prior to Admission medications   Medication Sig Start Date End Date Taking? Authorizing Provider  acetaminophen (TYLENOL) 325 MG tablet Take 650 mg by mouth every 6 (six) hours as needed ("for pain score 1-4; not to exceed #8 tablets in 24 hours").    Yes [provider]  albuterol (ACCUNEB) 0.63 MG/3ML nebulizer solution Take 3 mLs by nebulization every 6 (six) hours as needed for wheezing or shortness of breath.   Yes [provider]  buPROPion (WELLBUTRIN XL) 150 MG 24 hr tablet Take 150 mg by mouth daily.    Yes [provider]  diphenhydrAMINE (BENADRYL) 12.5 MG/5ML elixir Take 5 mLs (12.5 mg total) by mouth every 6 (six) hours as needed for itching. Over the counter Patient taking differently: Take 12.5 mg by mouth every 6 (six) hours as needed for itching.  04/25/17  Yes Rai, Ripudeep K, MD  doxazosin (CARDURA) 2 MG tablet Take 1 tablet (2 mg total) by mouth at bedtime. 04/25/17  Yes Rai, Ripudeep K, MD  ferrous sulfate 325 (65 FE) MG EC tablet Take 1 tablet (325 mg total) by mouth 2 (two) times daily. Patient taking differently: Take 325 mg by mouth 2 (two) times daily with a meal.  07/04/16 07/04/17 Yes Lavina Hamman, MD  finasteride (PROSCAR) 5 MG tablet Take 5 mg by mouth at bedtime.    Yes [provider]  fluticasone (FLONASE) 50 MCG/ACT nasal spray Place 1 spray into both nostrils 2 (two) times daily.    Yes [provider]  glipiZIDE (GLUCOTROL) 5 MG tablet Take 5 mg by mouth daily before  breakfast.   Yes [provider]  loratadine (CLARITIN) 10 MG tablet Take 10 mg by mouth daily.   Yes [provider]  Melatonin 5 MG TABS Take 5 mg by mouth at bedtime.   Yes [provider]  metoprolol succinate (TOPROL-XL) 25 MG 24 hr tablet Take 0.5 tablets (12.5 mg total) by mouth 2 (two) times daily with a meal. 04/26/17  Yes Rai, Ripudeep K, MD  Multiple Vitamins-Minerals (MULTIVITAMIN WITH MINERALS) tablet Take 1 tablet by mouth at bedtime.   Yes [provider]  neomycin-bacitracin-polymyxin (NEOSPORIN) ointment Apply 1 application topically daily. Apply to left elbow and forearm daily for 10 days. Started 05/14/17.   Yes [provider]  pantoprazole (PROTONIX) 40 MG tablet Take 1 tablet (40 mg total) by mouth daily. 05/10/17  Yes Aline August, MD  simvastatin (ZOCOR) 20 MG tablet Take 20 mg by mouth every evening.   Yes [provider]  traMADol (ULTRAM) 50 MG tablet Take 1 tablet (50 mg total) by mouth every 12 (twelve) hours as needed for moderate pain. 05/09/17  Yes Aline August, MD  Umeclidinium-Vilanterol (ANORO ELLIPTA) 62.5-25 MCG/INH AEPB Inhale 1 puff into the lungs daily.   Yes [provider]  OXYGEN Inhale 2 L into the lungs continuous.    [provider]    Social History   Socioeconomic History  . Marital status: Widowed    Spouse name: Not on file  . Number of children: 3  . Years of education: Not on file  . Highest education level: Not on file  Occupational History  . Occupation: Geographical information systems officer  . Financial resource strain: Not on file  . Food insecurity:    Worry: Not on file    Inability: Not on file  . Transportation needs:    Medical: Not on file    Non-medical: Not on file  Tobacco Use  . Smoking status: Current Every Day Smoker    Packs/day: 1.50    Years: 75.00    Pack years: 112.50    Types: Cigarettes  . Smokeless tobacco: Never Used  Substance and Sexual Activity  .  Alcohol use: No    Alcohol/week: 1.2 oz    Types: 2 Cans of beer per week  . Drug use: No  . Sexual activity: Never  Lifestyle  . Physical activity:    Days per week: Not on file    Minutes per session: Not on file  . Stress: Not on file  Relationships  . Social connections:    Talks on phone: Not on file    Gets together: Not on file    Attends religious service: Not on file    Active member of club or organization: Not on file    Attends meetings of clubs or organizations: Not on file    Relationship status: Not on file  . Intimate partner violence:    Fear of current or ex partner: Not on file    Emotionally abused: Not on file    Physically abused: Not on file    Forced sexual activity: Not on file  Other Topics Concern  . Not on file  Social History Narrative  . Not on file     Family History  Problem Relation Age of Onset  . Heart disease Mother   . Cancer Brother        colon  . Stroke Father     ROS: cannot obtain  Physical Examination  Vitals:   05/21/17 1500 05/21/17 1700  BP: (!) 110/53 (!) 110/46  Pulse: (!) 127 (!) 133  Resp: (!) 26 (!) 22  Temp:  99.4 F (37.4 C)  SpO2: 100% 99%   Body mass index is 21.62 kg/m.  General:  WDWN in NAD HENT: WNL, normocephalic Pulmonary: normal non-labored breathing Cardiac: palpable left radial pulse Abdomen: soft, NT/ND, no masses Extremities: bruising of left upper arm with 1+ edema Staples in place left axilla Pulsatility in av graft Neurologic: moving all extremities without deficits, confused  CBC    Component Value Date/Time   WBC 20.1 (H) 05/21/2017 1419   RBC 3.20 (L) 05/21/2017 1419   HGB 9.6 (L) 05/21/2017 1419   HGB 13.4 02/12/2010 1214   HCT 30.8 (L) 05/21/2017 1419   HCT 39.1 02/12/2010 1214   PLT 74 (L) 05/21/2017  1419   PLT 114 (L) 02/12/2010 1214   MCV 96.3 05/21/2017 1419   MCV 95.8 02/12/2010 1214   MCH 30.0 05/21/2017 1419   MCHC 31.2 05/21/2017 1419   RDW 19.0 (H)  05/21/2017 1419   RDW 14.2 02/12/2010 1214   LYMPHSABS 0.5 (L) 05/20/2017 1352   LYMPHSABS 1.7 02/12/2010 1214   MONOABS 0.7 05/20/2017 1352   MONOABS 0.7 02/12/2010 1214   EOSABS 0.0 05/20/2017 1352   EOSABS 0.2 02/12/2010 1214   BASOSABS 0.0 05/20/2017 1352   BASOSABS 0.0 02/12/2010 1214    BMET    Component Value Date/Time   NA 136 05/21/2017 0403   K 3.9 05/21/2017 0403   CL 99 (L) 05/21/2017 0403   CO2 23 05/21/2017 0403   GLUCOSE 144 (H) 05/21/2017 0403   BUN 37 (H) 05/21/2017 0403   CREATININE 4.42 (H) 05/21/2017 0403   CREATININE 1.50 (H) 10/18/2015 1140   CALCIUM 7.5 (L) 05/21/2017 0403   GFRNONAA 10 (L) 05/21/2017 0403   GFRAA 12 (L) 05/21/2017 0403    COAGS: Lab Results  Component Value Date   INR 1.47 05/21/2017   INR 1.37 05/20/2017   INR 1.33 05/10/2017       ASSESSMENT/PLAN: This is a 84 y.o. male with esrd on hd via tdc and has av graft placed 2.28. Ok to use graft although it is pulsatile and may not be functional. tdc was removed. We will also remove staples prior to discharge.  Kolbi Tofte C. Donzetta Matters, MD [facility] [place] Office: [phone] Pager: [phone]

## 2017-05-21 NOTE — Progress Notes (Signed)
HD tx initiated via 16Gx2 w/o problem, low bp on pre tx assess, Dr. Jimmy Footman made aware and ok to run tx w/ goal change to keep even -1L w/ keeping sbp>80, will cont to monitor while on HD tx

## 2017-05-22 ENCOUNTER — Inpatient Hospital Stay (HOSPITAL_COMMUNITY): Payer: PPO

## 2017-05-22 DIAGNOSIS — K921 Melena: Secondary | ICD-10-CM

## 2017-05-22 DIAGNOSIS — R042 Hemoptysis: Secondary | ICD-10-CM

## 2017-05-22 DIAGNOSIS — R410 Disorientation, unspecified: Secondary | ICD-10-CM

## 2017-05-22 DIAGNOSIS — I361 Nonrheumatic tricuspid (valve) insufficiency: Secondary | ICD-10-CM

## 2017-05-22 DIAGNOSIS — E43 Unspecified severe protein-calorie malnutrition: Secondary | ICD-10-CM

## 2017-05-22 LAB — GLUCOSE, CAPILLARY
Glucose-Capillary: 107 mg/dL — ABNORMAL HIGH (ref 65–99)
Glucose-Capillary: 180 mg/dL — ABNORMAL HIGH (ref 65–99)
Glucose-Capillary: 72 mg/dL (ref 65–99)

## 2017-05-22 LAB — MRSA PCR SCREENING: MRSA BY PCR: NEGATIVE

## 2017-05-22 LAB — ECHOCARDIOGRAM COMPLETE: WEIGHTICAEL: 2490.32 [oz_av]

## 2017-05-22 NOTE — Progress Notes (Addendum)
Daily Rounding Note  05/22/2017, 8:33 AM  LOS: 2 days   SUBJECTIVE:   Chief complaint:  Confusion, improved.  Tells me he feels the bleeding is coming from a nasal source.  Though he has not recently seen blood coming from his nose, at times he can taste blood.   Not clear why flexiseal placed, but no stool has collected in this.   Eating breakfast.    OBJECTIVE:         Vital signs in last 24 hours:    Temp:  [98.5 F (36.9 C)-99.8 F (37.7 C)] 98.5 F (36.9 C) (03/28 0627) Pulse Rate:  [86-133] 93 (03/28 0627) Resp:  [16-41] 18 (03/28 0627) BP: (87-147)/(37-103) 126/45 (03/28 0627) SpO2:  [98 %-100 %] 100 % (03/28 0627) Weight:  [155 lb 10.3 oz (70.6 kg)-157 lb 13.6 oz (71.6 kg)] 155 lb 10.3 oz (70.6 kg) (03/28 0020) Last BM Date: 05/20/17 Filed Weights   05/21/17 1920 05/21/17 2327 05/22/17 0020  Weight: 157 lb 13.6 oz (71.6 kg) 155 lb 10.3 oz (70.6 kg) 155 lb 10.3 oz (70.6 kg)   General: more alert, not obviously lethargic as was yesterday.  Speech more precise.  Very HOH.  Comfortable.     Heart: Irreg, irreg at rate 114.   Chest: diminished but clear, sputumy/wet cough.  No labored breathing.   Abdomen: soft, NT, ND.  Active BS  Extremities: no CCE Neuro/Psych:  Oriented to year, place, self.  MS much improved.  Moving all 4 limbs.  No tremor.    Intake/Output from previous day: 03/27 0701 - 03/28 0700 In: 478.1 [P.O.:420; IV Piggyback:58.1] Out: 1000   Intake/Output this shift: No intake/output data recorded.  Lab Results: Recent Labs    05/20/17 1352 05/20/17 1359 05/21/17 0403 05/21/17 1419  WBC 18.9*  --  13.9* 20.1*  HGB 9.6* 10.5* 8.4* 9.6*  HCT 30.8* 31.0* 27.0* 30.8*  PLT 100*  --  70* 74*   BMET Recent Labs    05/20/17 1352 05/20/17 1359 05/21/17 0403  NA 137 138 136  K 4.0 4.0 3.9  CL 99* 97* 99*  CO2 25  --  23  GLUCOSE 62* 60* 144*  BUN 28* 31* 37*  CREATININE 3.94* 4.00*  4.42*  CALCIUM 8.0*  --  7.5*   LFT Recent Labs    05/20/17 1352  PROT 6.5  ALBUMIN 2.5*  AST 43*  ALT 18  ALKPHOS 79  BILITOT 1.2   PT/INR Recent Labs    05/20/17 1352 05/21/17 0403  LABPROT 16.8* 17.7*  INR 1.37 1.47   Hepatitis Panel Recent Labs    05/21/17 2016  HEPBSAG Negative    Studies/Results: Ct Abdomen Pelvis Wo Contrast Ct Chest Wo Contrast  Result Date: 05/20/2017 CLINICAL DATA:  Hematemesis.  Possible sepsis. EXAM: CT CHEST, ABDOMEN AND PELVIS WITHOUT CONTRAST TECHNIQUE: Multidetector CT imaging of the chest, abdomen and pelvis was performed following the standard protocol without IV contrast. COMPARISON:  CT scan of Jun 29, 2011. FINDINGS: CT CHEST FINDINGS Cardiovascular: Atherosclerosis of thoracic aorta is noted without aneurysm formation. Left internal jugular catheter is noted with tip at cavoatrial junction. Normal cardiac size. No pericardial effusion. Status post aortic valve replacement. Mediastinum/Nodes: No enlarged mediastinal, hilar, or axillary lymph nodes. Thyroid gland, trachea, and esophagus demonstrate no significant findings. Lungs/Pleura: No pneumothorax is noted. Moderate right pleural effusion is noted with adjacent atelectasis or infiltrate in the right lower lobe. Minimal left pleural  effusion is noted with adjacent subsegmental atelectasis. Musculoskeletal: No chest wall mass or suspicious bone lesions identified. CT ABDOMEN PELVIS FINDINGS Hepatobiliary: No focal liver abnormality is seen. No gallstones, gallbladder wall thickening, or biliary dilatation. Pancreas: No ductal dilatation is noted. 2.2 cm rounded low density is noted in pancreatic body which is slightly enlarged compared to prior exam. Spleen: Normal in size without focal abnormality. Adrenals/Urinary Tract: Adrenal glands are unremarkable. Stable left renal cyst. No hydronephrosis or renal obstruction is noted. No renal or ureteral calculi are noted. Urinary bladder is  unremarkable. Stomach/Bowel: The stomach appears normal. There is no evidence of bowel obstruction or inflammation. Vascular/Lymphatic: Aortic atherosclerosis. No enlarged abdominal or pelvic lymph nodes. Reproductive: Prostate is unremarkable. Other: No abdominal wall hernia or abnormality. No abdominopelvic ascites. Musculoskeletal: No acute or significant osseous findings. IMPRESSION: 2.2 cm rounded density is noted in pancreatic body which is slightly enlarged compared to prior exam of 2013. Recommend follow up pre and post contrast MRI/MRCP or pancreatic protocol CT in 2 years. This recommendation follows ACR consensus guidelines: Management of Incidental Pancreatic Cysts: A White Paper of the ACR Incidental Findings Committee. [place] 3295;18:841-660. Moderate right pleural effusion is noted with adjacent atelectasis or infiltrate in the right lower lobe. Minimal left pleural effusion is noted with adjacent subsegmental atelectasis. Stable left renal cyst. Aortic Atherosclerosis (ICD10-I70.0). Electronically Signed   By: Marijo Conception, M.D.   On: 05/20/2017 17:27    Dg Chest Portable 1 View  Result Date: 05/20/2017 CLINICAL DATA:  Suspicion of sepsis. EXAM: PORTABLE CHEST 1 VIEW COMPARISON:  Portable chest x-ray of April 19, 2017 FINDINGS: The lungs are well-expanded. The interstitial markings remain increased bilaterally greatest on the right. The cardiac silhouette remains mildly enlarged. The pulmonary vascularity is engorged. The dual-lumen dialysis catheter tip projects at the cavoatrial junction. The sternal wires and left atrial appendage clip are stable. IMPRESSION: CHF with mild interstitial edema. One cannot exclude basilar pneumonia on the right. Electronically Signed   By: David  Martinique M.D.   On: 05/20/2017 15:05   Scheduled Meds: . [START ON 05/28/2017] darbepoetin (ARANESP) injection - DIALYSIS  200 mcg Intravenous Q Wed-HD  . feeding supplement (NEPRO CARB STEADY)  237 mL  Oral TID BM  . pantoprazole  40 mg Oral Q0600  . umeclidinium-vilanterol  1 puff Inhalation Daily   Continuous Infusions: . sodium chloride    . sodium chloride    .  ceFAZolin (ANCEF) IV Stopped (05/21/17 2300)   PRN Meds:.sodium chloride, sodium chloride, alteplase, heparin, ipratropium-albuterol, lidocaine (PF), lidocaine-prilocaine, pentafluoroprop-tetrafluoroeth  ASSESMENT:   *  Blood on shirt.  Suspect hemoptysis.  Hx epistaxis, black stools reported at admission 2 weeks ago.  Normal EGD 05/08/17, Dr Henrene Pastor suspected swallowed ENT bleeding source.    *  Anemia, normocytic, acute on chronic.  Received Aranesp (outp Mircera).  Received ferrlecit during last admission and po iron discontinued. Hgb improved without transfusion.    *  Pancreatic head density.   No plans for further wup per Dr Ardis Hughs.    *  Bil pleural effusions, atx, fever, hypotension.  On Vanc, Zosyn.    *  ESRD.  started HD 03/2017. Agree with Dr Hollie Salk that "he needs another pall care c/s to define scope of care.  I doubt that HD is contributing to QOL at this point and may actually be perpetuating problems for him."  However pt has not been open to pall care input on many occasions in recent  months.    *  MSSA bacteremia.  Source tunneled dialysis catheter?, has been removed.  On Ancef.     *   A fib, previously on Copumadin, discontinued during 3/12 admission and not restarted.    *  Chronic Thrombocytopenia.       PLAN   *   Remove flexiseal catheter.  No plans for EGD or other GI testing.      [name]  05/22/2017, 8:33 AM Pager: [phone]   ________________________________________________________________________  Velora Heckler GI MD note:  I personally examined the patient, reviewed the data and agree with the assessment and plan described above.  No overt bleeding.  I think he needs ENT evaluation, this was suggested by my partner Dr. Henrene Pastor as well 2 weeks ago after EGD.  The patient also thinks  the bleeding that he has seen is coming from his nose.  No plans for repeat EGD.  Please call or page with any further questions or concerns.    Owens Loffler, MD [facility] Pager [phone]

## 2017-05-22 NOTE — Progress Notes (Signed)
  Progress Note    05/22/2017 11:12 AM * No surgery found *  Subjective:  No complaints.  Family present in room during time of exam   Vitals:   05/22/17 0953 05/22/17 1005  BP: (!) 103/59   Pulse: (!) 111   Resp: 18   Temp: 98.3 F (36.8 C)   SpO2: 98% 98%   Physical Exam: Incisions:  L axilla incision well healed with staples in place Extremities:  Audible bruit L arm AV graft; no areas of cellulitis or fluctuance L arm Abdomen:  Soft Neurologic: baseline  CBC    Component Value Date/Time   WBC 20.1 (H) 05/21/2017 1419   RBC 3.20 (L) 05/21/2017 1419   HGB 9.6 (L) 05/21/2017 1419   HGB 13.4 02/12/2010 1214   HCT 30.8 (L) 05/21/2017 1419   HCT 39.1 02/12/2010 1214   PLT 74 (L) 05/21/2017 1419   PLT 114 (L) 02/12/2010 1214   MCV 96.3 05/21/2017 1419   MCV 95.8 02/12/2010 1214   MCH 30.0 05/21/2017 1419   MCHC 31.2 05/21/2017 1419   RDW 19.0 (H) 05/21/2017 1419   RDW 14.2 02/12/2010 1214   LYMPHSABS 0.5 (L) 05/20/2017 1352   LYMPHSABS 1.7 02/12/2010 1214   MONOABS 0.7 05/20/2017 1352   MONOABS 0.7 02/12/2010 1214   EOSABS 0.0 05/20/2017 1352   EOSABS 0.2 02/12/2010 1214   BASOSABS 0.0 05/20/2017 1352   BASOSABS 0.0 02/12/2010 1214    BMET    Component Value Date/Time   NA 136 05/21/2017 0403   K 3.9 05/21/2017 0403   CL 99 (L) 05/21/2017 0403   CO2 23 05/21/2017 0403   GLUCOSE 144 (H) 05/21/2017 0403   BUN 37 (H) 05/21/2017 0403   CREATININE 4.42 (H) 05/21/2017 0403   CREATININE 1.50 (H) 10/18/2015 1140   CALCIUM 7.5 (L) 05/21/2017 0403   GFRNONAA 10 (L) 05/21/2017 0403   GFRAA 12 (L) 05/21/2017 0403    INR    Component Value Date/Time   INR 1.47 05/21/2017 0403     Intake/Output Summary (Last 24 hours) at 05/22/2017 1112 Last data filed at 05/22/2017 0900 Gross per 24 hour  Intake 478.07 ml  Output 1150 ml  Net -671.93 ml     Assessment/Plan:  84 y.o. male s/p TDC removal; completed HD treatment via L arm AV graft without  complication  Remove staples L arm Unremarkable L TDC site s/p removal Continue HD via L arm AV graft per Nephrology; no sign of graft infection on exam   Dagoberto Ligas, PA-C [facility] [phone] 05/22/2017 11:12 AM

## 2017-05-22 NOTE — Evaluation (Signed)
Physical Therapy Evaluation Patient Details Name: Ronald [name] MRN: [id number] DOB: 13-Nov-1922 Today's Date: 05/22/2017   History of Present Illness  Pt is a 84 y/o male admitted secondary to feeling unwell and found to be septic. PMH including but not limited to a-fib, CHF, COPD, DM, HTN, PVD, ESRD, AVR in 2012.    Clinical Impression  Pt presented supine in bed with HOB elevated, awake and willing to participate in therapy session. Prior to admission, pt reported that he uses a w/c primarily for mobility. He stated that he could perform transfers independently and could propel himself in his w/c for the most part. Pt lives in an ALF. No family or caregivers were present to confirm the information provided by the patient. Pt is very HOH and had difficulty understanding history questions. Pt currently requires min A for bed mobility and min-mod A for transfers with use of RW. Pt would continue to benefit from skilled physical therapy services at this time while admitted and after d/c to address the below listed limitations in order to improve overall safety and independence with functional mobility.     Follow Up Recommendations SNF;Supervision/Assistance - 24 hour    Equipment Recommendations  None recommended by PT    Recommendations for Other Services       Precautions / Restrictions Precautions Precautions: Fall Restrictions Weight Bearing Restrictions: No      Mobility  Bed Mobility Overal bed mobility: Needs Assistance Bed Mobility: Supine to Sit     Supine to sit: Min assist     General bed mobility comments: min A for trunk elevation, increased time and effort  Transfers Overall transfer level: Needs assistance Equipment used: Rolling walker (2 wheeled) Transfers: Sit to/from [facility] Sit to Stand: Mod assist Stand pivot transfers: Min assist       General transfer comment: increased time and effort, good technique, assist to power into  standing from sitting EOB x2 and assist for stability with pivotal movements to chair  Ambulation/Gait                Stairs            Wheelchair Mobility    Modified Rankin (Stroke Patients Only)       Balance Overall balance assessment: Needs assistance Sitting-balance support: Feet supported;No upper extremity supported Sitting balance-Leahy Scale: Good     Standing balance support: Bilateral upper extremity supported Standing balance-Leahy Scale: Poor Standing balance comment: requires UE support                             Pertinent Vitals/Pain Pain Assessment: No/denies pain    Home Living Family/patient expects to be discharged to:: Assisted living Living Arrangements: Alone             Home Equipment: Walker - 2 wheels;Wheelchair - manual;Hospital bed      Prior Function Level of Independence: Needs assistance   Gait / Transfers Assistance Needed: pt stated that he performs transfers into and out of his w/c by himself and can propel himself some in the w/c           Hand Dominance        Extremity/Trunk Assessment   Upper Extremity Assessment Upper Extremity Assessment: Generalized weakness    Lower Extremity Assessment Lower Extremity Assessment: Generalized weakness    Cervical / Trunk Assessment Cervical / Trunk Assessment: Kyphotic  Communication   Communication: HOH;Other (comment)(speak into  R ear, very slowly)  Cognition Arousal/Alertness: Awake/alert Behavior During Therapy: WFL for tasks assessed/performed Overall Cognitive Status: Difficult to assess                                        General Comments      Exercises     Assessment/Plan    PT Assessment Patient needs continued PT services  PT Problem List Decreased strength;Decreased activity tolerance;Decreased coordination;Decreased mobility;Decreased balance;Decreased knowledge of use of DME;Decreased safety  awareness;Decreased knowledge of precautions       PT Treatment Interventions DME instruction;Gait training;Functional mobility training;Therapeutic activities;Therapeutic exercise;Neuromuscular re-education;Balance training;Patient/family education    PT Goals (Current goals can be found in the Care Plan section)  Acute Rehab PT Goals Patient Stated Goal: return home PT Goal Formulation: With patient Time For Goal Achievement: 06/05/17 Potential to Achieve Goals: Fair    Frequency Min 2X/week   Barriers to discharge        Co-evaluation               AM-PAC PT "6 Clicks" Daily Activity  Outcome Measure Difficulty turning over in bed (including adjusting bedclothes, sheets and blankets)?: Unable Difficulty moving from lying on back to sitting on the side of the bed? : Unable Difficulty sitting down on and standing up from a chair with arms (e.g., wheelchair, bedside commode, etc,.)?: Unable Help needed moving to and from a bed to chair (including a wheelchair)?: A Little Help needed walking in hospital room?: A Little Help needed climbing 3-5 steps with a railing? : A Lot 6 Click Score: 11    End of Session Equipment Utilized During Treatment: Gait belt Activity Tolerance: Patient tolerated treatment well Patient left: in chair;with call bell/phone within reach;Other (comment)(RN and nurse tech present in room) Nurse Communication: Mobility status PT Visit Diagnosis: Other abnormalities of gait and mobility (R26.89)    Time: 3267-1245 PT Time Calculation (min) (ACUTE ONLY): 22 min   Charges:   PT Evaluation $PT Eval Moderate Complexity: 1 Mod     PT G Codes:        [place], PT, DPT [place] 05/22/2017, 4:39 PM

## 2017-05-22 NOTE — Progress Notes (Addendum)
  Echocardiogram 2D Echocardiogram has been performed.  EXTREMELY difficult study due to patient small rib spacing.  Ronald [name] Androw 05/22/2017, 11:59 AM

## 2017-05-22 NOTE — Progress Notes (Addendum)
  [facility] Progress Note   Assessment/ Plan:    Dialyzes at [place]  EDW 69.5 kg. 4 hours MWF HD Bath 2K 2.5 Ca Dialyzer F180 BFR 400  Heparin none. Access [facility] and maturing AVG Mircera 225 q 2 weeks, to start 4.1, Mircera 100 mcg given 3/18. No VDRA or binders No heparin  1 Septic shock 2/2 MSSA Bacteremia: likely from [facility] which has been removed, greatly appreciate VVS.  On Ancef.  Will need TTE and repeat blood cultures this AM per ID notes.   2. ? GI bleed- story with blood all over shirt upon ED arrival but recent EGD was WNL.  NO plans for further testing per GI.   3 ESRD: MWF- HD 3/27, was able to use AVG 16 g needles without incident.  Will plan next HD 3/29. 4 Hypertension: hold BP meds 5. Anemia of ESRD: on Max ESA at OP center, dose Aranesp 200 mcg q week (given 3/27) 6. Metabolic Bone Disease: no binders or VDRA as OP 7.  Afib: Off AC 8. Dispo: he is frail and to be frank he has been repeatedly admitted to hospital essentially since starting dialysis.  I think that he needs another pall care c/s to define scope of care.  I doubt that HD is contributing to QOL at this point and may actually be perpetuating problems for him.    Subjective:    S/p TDC removal for MSSA bacteremia.  Sitting up in bed, eating breakfast today.  Doesn't remember any sequence of events since he came to [facility]   Objective:   BP (!) 103/59 (BP Location: Right Arm)   Pulse (!) 111   Temp 98.3 F (36.8 C) (Oral)   Resp 18   Wt 70.6 kg (155 lb 10.3 oz)   SpO2 98%   BMI 21.71 kg/m   Physical Exam: GEN elderly man, NAD, lying in bed HEENT EOMI PERRL NECK no JVD PULM clear bilaterally CV RRR today, soft systolic murmur with louder S2 ABD soft, nontender, thin, no bruising  EXT no LE edema NEURO able to converse and carry on a conversation today, much more awake and alert than yesterday SKIN  Warm and dry ACCESS: L IJ TDC removed, L UE AVG dressed with +  T/B    Labs: BMET Recent Labs  Lab 05/20/17 1352 05/20/17 1359 05/21/17 0403  NA 137 138 136  K 4.0 4.0 3.9  CL 99* 97* 99*  CO2 25  --  23  GLUCOSE 62* 60* 144*  BUN 28* 31* 37*  CREATININE 3.94* 4.00* 4.42*  CALCIUM 8.0*  --  7.5*  PHOS  --   --  5.7*   CBC Recent Labs  Lab 05/20/17 1352 05/20/17 1359 05/21/17 0403 05/21/17 1419  WBC 18.9*  --  13.9* 20.1*  NEUTROABS 17.6*  --   --   --   HGB 9.6* 10.5* 8.4* 9.6*  HCT 30.8* 31.0* 27.0* 30.8*  MCV 99.0  --  96.8 96.3  PLT 100*  --  70* 74*    @IMGRELPRIORS @ Medications:    . [START ON 05/28/2017] darbepoetin (ARANESP) injection - DIALYSIS  200 mcg Intravenous Q Wed-HD  . feeding supplement (NEPRO CARB STEADY)  237 mL Oral TID BM  . pantoprazole  40 mg Oral Q0600  . umeclidinium-vilanterol  1 puff Inhalation Daily     Madelon Lips MD [facility] pgr [phone] 05/22/2017, 10:00 AM

## 2017-05-22 NOTE — Clinical Social Work Note (Signed)
Ronald Breech, RN, Resident Care Director from [place] [phone]) came to visit with patient and requested that CSW inform her when patient is ready for discharge. Ronald [name] expressed that patient will probably need ST rehab. CSW will request PT/OT orders be place and will continue to follow.  Ronald [name], MSW, LCSW Licensed Clinical Social Worker [place] [phone]

## 2017-05-22 NOTE — Progress Notes (Signed)
[name], male   DOB: 08-May-1922, 84 y.o.   MRN: [id number]          [facility]  Date of Admission:  05/20/2017   Total days of antibiotics 3        Day 2 cefazolin         ASSESSMENT: He has MSSA bacteremia, most likely as a result of his recently removed hemodialysis catheter.  There is no obvious evidence of endocarditis by exam or TTE.  I talked to him about the possibility of a transesophageal echo cardiogram versus simply continuing cefazolin after hemodialysis and treating for the possibility of prosthetic aortic valve endocarditis.  He asked me which option would be safest.  I told him that it would probably make sense to avoid the TEE and last blood cultures are repeatedly positive.  He seems inclined to continue hemodialysis.  PLAN: 1. Continue cefazolin 2. Await results of repeat blood cultures  Principal Problem:   Bacteremia due to methicillin susceptible Staphylococcus aureus (MSSA) Active Problems:   Sepsis ([place])   S/P aortic valve replacement: #23 Magna Ease Edwards Pericardial Valve  November 2012   Protein-calorie malnutrition, severe   Hemoptysis   Scheduled Meds: . [START ON 05/28/2017] darbepoetin (ARANESP) injection - DIALYSIS  200 mcg Intravenous Q Wed-HD  . feeding supplement (NEPRO CARB STEADY)  237 mL Oral TID BM  . pantoprazole  40 mg Oral Q0600  . umeclidinium-vilanterol  1 puff Inhalation Daily   Continuous Infusions: . sodium chloride    . sodium chloride    .  ceFAZolin (ANCEF) IV Stopped (05/21/17 2300)   PRN Meds:.sodium chloride, sodium chloride, alteplase, heparin, ipratropium-albuterol, lidocaine (PF), lidocaine-prilocaine, pentafluoroprop-tetrafluoroeth   SUBJECTIVE: He says that he has feeling well.  He has no recall of being admitted to the hospital.  Review of Systems: Review of Systems  Unable to perform ROS: Mental acuity    Allergies  Allergen Reactions  . Aspirin Anaphylaxis,  Shortness Of Breath, Rash and Other (See Comments)    "broke out in white welts; red blotches neck and face; windpipe closed; ~ 1962"  . Tape Other (See Comments)    Patient's skin is VERY THIN and tears and bruises easily!!    OBJECTIVE: Vitals:   05/22/17 0020 05/22/17 0627 05/22/17 0953 05/22/17 1005  BP: (!) 99/37 (!) 126/45 (!) 103/59   Pulse: 90 93 (!) 111   Resp: 20 18 18    Temp: 99.5 F (37.5 C) 98.5 F (36.9 C) 98.3 F (36.8 C)   TempSrc: Oral Oral Oral   SpO2: 100% 100% 98% 98%  Weight: 155 lb 10.3 oz (70.6 kg)      Body mass index is 21.71 kg/m.  Physical Exam  Constitutional:  He is more alert today but is still slow to respond and seems a little confused.  Cardiovascular:  No murmur heard. Distant heart sounds  Pulmonary/Chest: Effort normal. He has no wheezes. He has no rales.  Musculoskeletal: Normal range of motion. He exhibits no edema or tenderness.  Neurological: He is alert.  Skin: No rash noted.  Left upper arm graft looks good.    Lab Results Lab Results  Component Value Date   WBC 20.1 (H) 05/21/2017   HGB 9.6 (L) 05/21/2017   HCT 30.8 (L) 05/21/2017   MCV 96.3 05/21/2017   PLT 74 (L) 05/21/2017    Lab Results  Component Value Date   CREATININE 4.42 (H) 05/21/2017   BUN 37 (  H) 05/21/2017   NA 136 05/21/2017   K 3.9 05/21/2017   CL 99 (L) 05/21/2017   CO2 23 05/21/2017    Lab Results  Component Value Date   ALT 18 05/20/2017   AST 43 (H) 05/20/2017   ALKPHOS 79 05/20/2017   BILITOT 1.2 05/20/2017     Microbiology: Recent Results (from the past 240 hour(s))  Culture, blood (Routine x 2)     Status: Abnormal (Preliminary result)   Collection Time: 05/20/17  1:36 PM  Result Value Ref Range Status   Specimen Description   Final    BLOOD LEFT HAND Performed at [facility] [address], [place], [place] [address]    Special Requests   Final    BOTTLES DRAWN AEROBIC AND ANAEROBIC Blood Culture adequate  volume Performed at [facility] [address], [place], [place] [address]    Culture  Setup Time   Final    GRAM POSITIVE COCCI AEROBIC BOTTLE ONLY CRITICAL RESULT CALLED TO, READ BACK BY AND VERIFIED WITH: L SEAY PHARMD 05/21/17 0427 JDW    Culture (A)  Final    STAPHYLOCOCCUS AUREUS SUSCEPTIBILITIES TO FOLLOW Performed at [facility], [place] [address], [place], [place] [address]    Report Status PENDING  Incomplete  Blood Culture ID Panel (Reflexed)     Status: Abnormal   Collection Time: 05/20/17  1:36 PM  Result Value Ref Range Status   Enterococcus species NOT DETECTED NOT DETECTED Final   Listeria monocytogenes NOT DETECTED NOT DETECTED Final   Staphylococcus species DETECTED (A) NOT DETECTED Final    Comment: CRITICAL RESULT CALLED TO, READ BACK BY AND VERIFIED WITH: L SEAY PHARMD 05/21/17 0427 JDW    Staphylococcus aureus DETECTED (A) NOT DETECTED Final    Comment: Methicillin (oxacillin) susceptible Staphylococcus aureus (MSSA). Preferred therapy is anti staphylococcal beta lactam antibiotic (Cefazolin or Nafcillin), unless clinically contraindicated. CRITICAL RESULT CALLED TO, READ BACK BY AND VERIFIED WITH: L SEAY PHARMD 05/21/17 0427 JDW    Methicillin resistance NOT DETECTED NOT DETECTED Final   Streptococcus species NOT DETECTED NOT DETECTED Final   Streptococcus agalactiae NOT DETECTED NOT DETECTED Final   Streptococcus pneumoniae NOT DETECTED NOT DETECTED Final   Streptococcus pyogenes NOT DETECTED NOT DETECTED Final   Acinetobacter baumannii NOT DETECTED NOT DETECTED Final   Enterobacteriaceae species NOT DETECTED NOT DETECTED Final   Enterobacter cloacae complex NOT DETECTED NOT DETECTED Final   Escherichia coli NOT DETECTED NOT DETECTED Final   Klebsiella oxytoca NOT DETECTED NOT DETECTED Final   Klebsiella pneumoniae NOT DETECTED NOT DETECTED Final   Proteus species NOT DETECTED NOT DETECTED Final   Serratia marcescens NOT DETECTED  NOT DETECTED Final   Haemophilus influenzae NOT DETECTED NOT DETECTED Final   Neisseria meningitidis NOT DETECTED NOT DETECTED Final   Pseudomonas aeruginosa NOT DETECTED NOT DETECTED Final   Candida albicans NOT DETECTED NOT DETECTED Final   Candida glabrata NOT DETECTED NOT DETECTED Final   Candida krusei NOT DETECTED NOT DETECTED Final   Candida parapsilosis NOT DETECTED NOT DETECTED Final   Candida tropicalis NOT DETECTED NOT DETECTED Final    Comment: Performed at [facility], [place]. [address] [place], [place] [address]  Culture, blood (Routine x 2)     Status: None (Preliminary result)   Collection Time: 05/20/17  1:50 PM  Result Value Ref Range Status   Specimen Description   Final    BLOOD RIGHT ANTECUBITAL Performed at [facility]  [place] [address], [place], [place] [address]    Special Requests   Final    BOTTLES DRAWN AEROBIC AND ANAEROBIC Blood Culture adequate volume Performed at [facility] [address], [place], [place] [address]    Culture  Setup Time   Final    GRAM POSITIVE COCCI ANAEROBIC BOTTLE ONLY CRITICAL RESULT CALLED TO, READ BACK BY AND VERIFIED WITH: L SEAY PHARMD 05/21/17 0427 JDW    Culture   Final    NO GROWTH 2 DAYS Performed at [facility], [place] [address] [place], [place] [address]    Report Status PENDING  Incomplete    Michel Bickers, MD [facility] [phone] pager   [phone] cell 05/22/2017, 3:20 PM

## 2017-05-22 NOTE — Progress Notes (Signed)
[facility]                                                                              Patient Demographics  Ronald [name], is a 85 y.o. male, DOB - [birth year], ZOX:[id number]  Admit date - 05/20/2017   Admitting Physician Jesus Genera, MD  Outpatient Primary MD for the patient is [name], MD  Outpatient specialists:   LOS - 2  days   Medical records reviewed and are as summarized below:    Chief Complaint  Patient presents with  . Emesis  . Blood Infection       Brief summary   Per admission note by P CCM, Dr. Chase Caller on [age] year old male with PMH as below, which is significant for smoker since 84 yrs old, ESRD on HD (only for 4-6 weeks) MWF, AF not on AC, COPD, diastolic CHF, DM, recent GI bleed and PVD. He was in his usual state of health until 3/25 when family visited him at [facility] and he was "not feeling well". He received HD and was lethargic before and after. He complained of being very tired and fatigued. Then 3/26 family went to visit him again and noticed blood all over the front of his shirt, thought to be emesis. EMS was called and he was transported to [facility] ED. Upon arrival to ED he was noted to be febrile 103.3,  Tachypneic, and hypotensive. He was started on O2, IVF, and ABX. CT concerning for pneumonia. Based on hypotension and concern for CRRT need he was transferred to [facility] ICU.   Patient was admitted by critical care service, subsequently transferred to [facility] service, assumed care on 3/28     Assessment & Plan    Principal Problem:   Bacteremia due to methicillin susceptible Staphylococcus aureus (MSSA)/sepsis/septic shock -Source possibly due to tunneled dialysis catheter, vascular surgery was consulted  -[facility] has been removed, currently on Ancef -ID consulted, follow 2D echo results, repeat blood cultures  Active Problems: Right lower lung pneumonia versus atelectasis -Patient met sepsis criteria at  the time of admission with tachypnea, hypotensive, febrile, blood cultures positive for MSSA, chest x-ray showed RLL infiltrates versus atelectasis -Continue IV Ancef  ESRD on hemodialysis - Nephrology consulted hemodialysis MWF  History of aortic stenosis S/P aortic valve replacement: 2012     Protein-calorie malnutrition, severe -Nutrition consult   Acute encephalopathy/delirium -Likely due to #1 and #2, patient also appears to have some underlying dementia  Blood on the showed,?  Hemoptysis, acute on chronic normocytic anemia -Patient has a history of epistaxis, had melanotic stools on admission 2 weeks ago and underwent normal EGD. -GI was consulted, currently no acute issues, no plans of further GI testing  Chronic atrial fibrillation -Previously on Coumadin which was discontinued during the previous admission due to GI bleed  Pancreatic body lesion seen on CT CT abdomen showed 2.2 cm rounded density in the pancreatic body -No plans of for further workup per GI   Code Status: Full code DVT Prophylaxis:   SCD's Family Communication: No family member at the bedside   Disposition Plan: Needs to have  goals of care with palliative  Time Spent in minutes 35 minutes   Procedures:  TDC removal HD  Consultants:   Nephrology Vascular surgery Infectious disease GI  Antimicrobials:   IV cefazolin 3/27   Medications  Scheduled Meds: . [START ON 05/28/2017] darbepoetin (ARANESP) injection - DIALYSIS  200 mcg Intravenous Q Wed-HD  . feeding supplement (NEPRO CARB STEADY)  237 mL Oral TID BM  . pantoprazole  40 mg Oral Q0600  . umeclidinium-vilanterol  1 puff Inhalation Daily   Continuous Infusions: . sodium chloride    . sodium chloride    .  ceFAZolin (ANCEF) IV Stopped (05/21/17 2300)   PRN Meds:.sodium chloride, sodium chloride, alteplase, heparin, ipratropium-albuterol, lidocaine (PF), lidocaine-prilocaine, pentafluoroprop-tetrafluoroeth   Antibiotics    Anti-infectives (From admission, onward)   Start     Dose/Rate Route Frequency Ordered Stop   05/21/17 1800  ceFAZolin (ANCEF) IVPB 1 g/50 mL premix     1 g 100 mL/hr over 30 Minutes Intravenous Every 24 hours 05/21/17 1122     05/21/17 0000  piperacillin-tazobactam (ZOSYN) IVPB 3.375 g  Status:  Discontinued     3.375 g 12.5 mL/hr over 240 Minutes Intravenous Every 12 hours 05/20/17 1909 05/21/17 1122   05/20/17 1515  vancomycin (VANCOCIN) 1,500 mg in sodium chloride 0.9 % 500 mL IVPB     1,500 mg 250 mL/hr over 120 Minutes Intravenous  Once 05/20/17 1508 05/20/17 1845   05/20/17 1500  piperacillin-tazobactam (ZOSYN) IVPB 2.25 g     2.25 g 100 mL/hr over 30 Minutes Intravenous STAT 05/20/17 1445 05/20/17 1618   05/20/17 1445  clindamycin (CLEOCIN) IVPB 600 mg  Status:  Discontinued     600 mg 100 mL/hr over 30 Minutes Intravenous Every 8 hours 05/20/17 1443 05/20/17 1916        Subjective:   Ronald [name] was seen and examined today.  Hard of hearing, some confusion, denies any specific complaints no further bleeding noted.  Afebrile.  Very difficult to obtain review of system from the patient, possibly has underlying dementia   Objective:   Vitals:   05/22/17 0020 05/22/17 0627 05/22/17 0953 05/22/17 1005  BP: (!) 99/37 (!) 126/45 (!) 103/59   Pulse: 90 93 (!) 111   Resp: '20 18 18   '$ Temp: 99.5 F (37.5 C) 98.5 F (36.9 C) 98.3 F (36.8 C)   TempSrc: Oral Oral Oral   SpO2: 100% 100% 98% 98%  Weight: 70.6 kg (155 lb 10.3 oz)       Intake/Output Summary (Last 24 hours) at 05/22/2017 1231 Last data filed at 05/22/2017 0900 Gross per 24 hour  Intake 478.07 ml  Output 1150 ml  Net -671.93 ml     Wt Readings from Last 3 Encounters:  05/22/17 70.6 kg (155 lb 10.3 oz)  05/09/17 69.2 kg (152 lb 8.9 oz)  05/05/17 71.7 kg (158 lb)     Exam  General: Alert and awake, eating breakfast, hard of hearing  Eyes:   HEENT:  Atraumatic, normocephalic  Cardiovascular:  S1 S2 auscultated, soft systolic murmur, irregularly irregular  Respiratory: Clear to auscultation bilaterally, no wheezing, rales or rhonchi  Gastrointestinal: Soft, nontender, nondistended, + bowel sounds  Ext: no pedal edema bilaterally  Neuro: moving all 4 extremities  Musculoskeletal: No digital cyanosis, clubbing  Skin: No rashes  Psych: somewhat confused, alert and awake   Data Reviewed:  I have personally reviewed following labs and imaging studies  Micro Results Recent Results (from the past 240 hour(s))  Culture, blood (Routine x 2)     Status: Abnormal (Preliminary result)   Collection Time: 05/20/17  1:36 PM  Result Value Ref Range Status   Specimen Description   Final    BLOOD LEFT HAND Performed at [facility] [address], [place], [place] [address]    Special Requests   Final    BOTTLES DRAWN AEROBIC AND ANAEROBIC Blood Culture adequate volume Performed at [facility] [address] [place], [place] [address]    Culture  Setup Time   Final    GRAM POSITIVE COCCI AEROBIC BOTTLE ONLY CRITICAL RESULT CALLED TO, READ BACK BY AND VERIFIED WITH: L SEAY PHARMD 05/21/17 0427 JDW    Culture (A)  Final    STAPHYLOCOCCUS AUREUS SUSCEPTIBILITIES TO FOLLOW Performed at [facility], [place] [address], [place], [place] [address]    Report Status PENDING  Incomplete  Blood Culture ID Panel (Reflexed)     Status: Abnormal   Collection Time: 05/20/17  1:36 PM  Result Value Ref Range Status   Enterococcus species NOT DETECTED NOT DETECTED Final   Listeria monocytogenes NOT DETECTED NOT DETECTED Final   Staphylococcus species DETECTED (A) NOT DETECTED Final    Comment: CRITICAL RESULT CALLED TO, READ BACK BY AND VERIFIED WITH: L SEAY PHARMD 05/21/17 0427 JDW    Staphylococcus aureus DETECTED (A) NOT DETECTED Final    Comment: Methicillin (oxacillin) susceptible Staphylococcus aureus (MSSA). Preferred therapy is anti  staphylococcal beta lactam antibiotic (Cefazolin or Nafcillin), unless clinically contraindicated. CRITICAL RESULT CALLED TO, READ BACK BY AND VERIFIED WITH: L SEAY PHARMD 05/21/17 0427 JDW    Methicillin resistance NOT DETECTED NOT DETECTED Final   Streptococcus species NOT DETECTED NOT DETECTED Final   Streptococcus agalactiae NOT DETECTED NOT DETECTED Final   Streptococcus pneumoniae NOT DETECTED NOT DETECTED Final   Streptococcus pyogenes NOT DETECTED NOT DETECTED Final   Acinetobacter baumannii NOT DETECTED NOT DETECTED Final   Enterobacteriaceae species NOT DETECTED NOT DETECTED Final   Enterobacter cloacae complex NOT DETECTED NOT DETECTED Final   Escherichia coli NOT DETECTED NOT DETECTED Final   Klebsiella oxytoca NOT DETECTED NOT DETECTED Final   Klebsiella pneumoniae NOT DETECTED NOT DETECTED Final   Proteus species NOT DETECTED NOT DETECTED Final   Serratia marcescens NOT DETECTED NOT DETECTED Final   Haemophilus influenzae NOT DETECTED NOT DETECTED Final   Neisseria meningitidis NOT DETECTED NOT DETECTED Final   Pseudomonas aeruginosa NOT DETECTED NOT DETECTED Final   Candida albicans NOT DETECTED NOT DETECTED Final   Candida glabrata NOT DETECTED NOT DETECTED Final   Candida krusei NOT DETECTED NOT DETECTED Final   Candida parapsilosis NOT DETECTED NOT DETECTED Final   Candida tropicalis NOT DETECTED NOT DETECTED Final    Comment: Performed at [facility], [place]. [address] [place], [place] [address]  Culture, blood (Routine x 2)     Status: None (Preliminary result)   Collection Time: 05/20/17  1:50 PM  Result Value Ref Range Status   Specimen Description   Final    BLOOD RIGHT ANTECUBITAL Performed at [facility] [address] [place], [place] [address]    Special Requests   Final    BOTTLES DRAWN AEROBIC AND ANAEROBIC Blood Culture adequate volume Performed at [facility] [address], [place], [place] [address]      Culture  Setup Time   Final    GRAM POSITIVE COCCI ANAEROBIC BOTTLE ONLY CRITICAL RESULT CALLED  TO, READ BACK BY AND VERIFIED WITH: L SEAY PHARMD 05/21/17 0427 JDW    Culture   Final    NO GROWTH 2 DAYS Performed at [facility], [place] [address], [place], [place] [address]    Report Status PENDING  Incomplete    Radiology Reports Ct Abdomen Pelvis Wo Contrast  Result Date: 05/20/2017 CLINICAL DATA:  Hematemesis.  Possible sepsis. EXAM: CT CHEST, ABDOMEN AND PELVIS WITHOUT CONTRAST TECHNIQUE: Multidetector CT imaging of the chest, abdomen and pelvis was performed following the standard protocol without IV contrast. COMPARISON:  CT scan of Jun 29, 2011. FINDINGS: CT CHEST FINDINGS Cardiovascular: Atherosclerosis of thoracic aorta is noted without aneurysm formation. Left internal jugular catheter is noted with tip at cavoatrial junction. Normal cardiac size. No pericardial effusion. Status post aortic valve replacement. Mediastinum/Nodes: No enlarged mediastinal, hilar, or axillary lymph nodes. Thyroid gland, trachea, and esophagus demonstrate no significant findings. Lungs/Pleura: No pneumothorax is noted. Moderate right pleural effusion is noted with adjacent atelectasis or infiltrate in the right lower lobe. Minimal left pleural effusion is noted with adjacent subsegmental atelectasis. Musculoskeletal: No chest wall mass or suspicious bone lesions identified. CT ABDOMEN PELVIS FINDINGS Hepatobiliary: No focal liver abnormality is seen. No gallstones, gallbladder wall thickening, or biliary dilatation. Pancreas: No ductal dilatation is noted. 2.2 cm rounded low density is noted in pancreatic body which is slightly enlarged compared to prior exam. Spleen: Normal in size without focal abnormality. Adrenals/Urinary Tract: Adrenal glands are unremarkable. Stable left renal cyst. No hydronephrosis or renal obstruction is noted. No renal or ureteral calculi are noted. Urinary bladder is unremarkable.  Stomach/Bowel: The stomach appears normal. There is no evidence of bowel obstruction or inflammation. Vascular/Lymphatic: Aortic atherosclerosis. No enlarged abdominal or pelvic lymph nodes. Reproductive: Prostate is unremarkable. Other: No abdominal wall hernia or abnormality. No abdominopelvic ascites. Musculoskeletal: No acute or significant osseous findings. IMPRESSION: 2.2 cm rounded density is noted in pancreatic body which is slightly enlarged compared to prior exam of 2013. Recommend follow up pre and post contrast MRI/MRCP or pancreatic protocol CT in 2 years. This recommendation follows ACR consensus guidelines: Management of Incidental Pancreatic Cysts: A White Paper of the ACR Incidental Findings Committee. [place] 9563;87:564-332. Moderate right pleural effusion is noted with adjacent atelectasis or infiltrate in the right lower lobe. Minimal left pleural effusion is noted with adjacent subsegmental atelectasis. Stable left renal cyst. Aortic Atherosclerosis (ICD10-I70.0). Electronically Signed   By: Marijo Conception, M.D.   On: 05/20/2017 17:27   Ct Chest Wo Contrast  Result Date: 05/20/2017 CLINICAL DATA:  Hematemesis.  Possible sepsis. EXAM: CT CHEST, ABDOMEN AND PELVIS WITHOUT CONTRAST TECHNIQUE: Multidetector CT imaging of the chest, abdomen and pelvis was performed following the standard protocol without IV contrast. COMPARISON:  CT scan of Jun 29, 2011. FINDINGS: CT CHEST FINDINGS Cardiovascular: Atherosclerosis of thoracic aorta is noted without aneurysm formation. Left internal jugular catheter is noted with tip at cavoatrial junction. Normal cardiac size. No pericardial effusion. Status post aortic valve replacement. Mediastinum/Nodes: No enlarged mediastinal, hilar, or axillary lymph nodes. Thyroid gland, trachea, and esophagus demonstrate no significant findings. Lungs/Pleura: No pneumothorax is noted. Moderate right pleural effusion is noted with adjacent atelectasis or  infiltrate in the right lower lobe. Minimal left pleural effusion is noted with adjacent subsegmental atelectasis. Musculoskeletal: No chest wall mass or suspicious bone lesions identified. CT ABDOMEN PELVIS FINDINGS Hepatobiliary: No focal liver abnormality is seen. No gallstones, gallbladder wall thickening, or biliary dilatation. Pancreas: No  ductal dilatation is noted. 2.2 cm rounded low density is noted in pancreatic body which is slightly enlarged compared to prior exam. Spleen: Normal in size without focal abnormality. Adrenals/Urinary Tract: Adrenal glands are unremarkable. Stable left renal cyst. No hydronephrosis or renal obstruction is noted. No renal or ureteral calculi are noted. Urinary bladder is unremarkable. Stomach/Bowel: The stomach appears normal. There is no evidence of bowel obstruction or inflammation. Vascular/Lymphatic: Aortic atherosclerosis. No enlarged abdominal or pelvic lymph nodes. Reproductive: Prostate is unremarkable. Other: No abdominal wall hernia or abnormality. No abdominopelvic ascites. Musculoskeletal: No acute or significant osseous findings. IMPRESSION: 2.2 cm rounded density is noted in pancreatic body which is slightly enlarged compared to prior exam of 2013. Recommend follow up pre and post contrast MRI/MRCP or pancreatic protocol CT in 2 years. This recommendation follows ACR consensus guidelines: Management of Incidental Pancreatic Cysts: A White Paper of the ACR Incidental Findings Committee. [place] 2595;63:[id number]. Moderate right pleural effusion is noted with adjacent atelectasis or infiltrate in the right lower lobe. Minimal left pleural effusion is noted with adjacent subsegmental atelectasis. Stable left renal cyst. Aortic Atherosclerosis (ICD10-I70.0). Electronically Signed   By: Marijo Conception, M.D.   On: 05/20/2017 17:27   Dg Chest Portable 1 View  Result Date: 05/20/2017 CLINICAL DATA:  Suspicion of sepsis. EXAM: PORTABLE CHEST 1 VIEW  COMPARISON:  Portable chest x-ray of April 19, 2017 FINDINGS: The lungs are well-expanded. The interstitial markings remain increased bilaterally greatest on the right. The cardiac silhouette remains mildly enlarged. The pulmonary vascularity is engorged. The dual-lumen dialysis catheter tip projects at the cavoatrial junction. The sternal wires and left atrial appendage clip are stable. IMPRESSION: CHF with mild interstitial edema. One cannot exclude basilar pneumonia on the right. Electronically Signed   By: David  Martinique M.D.   On: 05/20/2017 15:05    Lab Data:  CBC: Recent Labs  Lab 05/20/17 1352 05/20/17 1359 05/21/17 0403 05/21/17 1419  WBC 18.9*  --  13.9* 20.1*  NEUTROABS 17.6*  --   --   --   HGB 9.6* 10.5* 8.4* 9.6*  HCT 30.8* 31.0* 27.0* 30.8*  MCV 99.0  --  96.8 96.3  PLT 100*  --  70* 74*   Basic Metabolic Panel: Recent Labs  Lab 05/20/17 1352 05/20/17 1359 05/21/17 0403  NA 137 138 136  K 4.0 4.0 3.9  CL 99* 97* 99*  CO2 25  --  23  GLUCOSE 62* 60* 144*  BUN 28* 31* 37*  CREATININE 3.94* 4.00* 4.42*  CALCIUM 8.0*  --  7.5*  MG  --   --  1.9  PHOS  --   --  5.7*   GFR: Estimated Creatinine Clearance: 10.4 mL/min (A) (by C-G formula based on SCr of 4.42 mg/dL (H)). Liver Function Tests: Recent Labs  Lab 05/20/17 1352  AST 43*  ALT 18  ALKPHOS 79  BILITOT 1.2  PROT 6.5  ALBUMIN 2.5*   No results for input(s): LIPASE, AMYLASE in the last 168 hours. No results for input(s): AMMONIA in the last 168 hours. Coagulation Profile: Recent Labs  Lab 05/20/17 1352 05/21/17 0403  INR 1.37 1.47   Cardiac Enzymes: No results for input(s): CKTOTAL, CKMB, CKMBINDEX, TROPONINI in the last 168 hours. BNP (last 3 results) No results for input(s): PROBNP in the last 8760 hours. HbA1C: No results for input(s): HGBA1C in the last 72 hours. CBG: Recent Labs  Lab 05/21/17 0308 05/21/17 3295 05/21/17 1630 05/22/17 0016 05/22/17 1884  GLUCAP 145* 147* 161*  107* 72   Lipid Profile: No results for input(s): CHOL, HDL, LDLCALC, TRIG, CHOLHDL, LDLDIRECT in the last 72 hours. Thyroid Function Tests: No results for input(s): TSH, T4TOTAL, FREET4, T3FREE, THYROIDAB in the last 72 hours. Anemia Panel: No results for input(s): VITAMINB12, FOLATE, FERRITIN, TIBC, IRON, RETICCTPCT in the last 72 hours. Urine analysis:    Component Value Date/Time   COLORURINE AMBER (A) 05/05/2017 1959   APPEARANCEUR TURBID (A) 05/05/2017 1959   LABSPEC  05/05/2017 1959    TEST NOT REPORTED DUE TO COLOR INTERFERENCE OF URINE PIGMENT   PHURINE  05/05/2017 1959    TEST NOT REPORTED DUE TO COLOR INTERFERENCE OF URINE PIGMENT   GLUCOSEU (A) 05/05/2017 1959    TEST NOT REPORTED DUE TO COLOR INTERFERENCE OF URINE PIGMENT   HGBUR (A) 05/05/2017 1959    TEST NOT REPORTED DUE TO COLOR INTERFERENCE OF URINE PIGMENT   BILIRUBINUR (A) 05/05/2017 1959    TEST NOT REPORTED DUE TO COLOR INTERFERENCE OF URINE PIGMENT   KETONESUR (A) 05/05/2017 1959    TEST NOT REPORTED DUE TO COLOR INTERFERENCE OF URINE PIGMENT   PROTEINUR (A) 05/05/2017 1959    TEST NOT REPORTED DUE TO COLOR INTERFERENCE OF URINE PIGMENT   UROBILINOGEN 1.0 12/13/2013 1556   NITRITE (A) 05/05/2017 1959    TEST NOT REPORTED DUE TO COLOR INTERFERENCE OF URINE PIGMENT   LEUKOCYTESUR (A) 05/05/2017 1959    TEST NOT REPORTED DUE TO COLOR INTERFERENCE OF URINE PIGMENT     Unique Sillas M.D. [facility] 05/22/2017, 12:31 PM  Pager: [phone] Between 7am to 7pm - call Pager - [phone]  After 7pm go to [email or web address] - password [email or web address]  Call night coverage person covering after 7pm

## 2017-05-22 NOTE — Care Management Note (Addendum)
Case Management Note  Patient Details  Name: Ronald [name] MRN: [id number] Date of Birth: [birth year]  Subjective/Objective:     Admitted for Septic shock 2/2 MSSA Bacteremia:      Action/Plan: Vascular consulted:TDC  has been removed on Ancef.  Will need TTE and repeat blood cultures  PTA, pt resided at [facility]; [place] "Ronald [name]" following once patient is medically stable and will be returning to the same living situation after discharge.  LCSW "Ronald [name]" following may need ST-rehab.  Expected Discharge Date:  (unknown)               Expected Discharge Plan:  Assisted Living / Rest Home  In-House Referral:  Clinical Social Work  Discharge planning Services  CM Consult  Status of Service:  In process, will continue to follow  Kristen Cardinal, RN  Nurse case [place] 05/22/2017, 12:09 PM

## 2017-05-23 ENCOUNTER — Ambulatory Visit: Payer: Self-pay

## 2017-05-23 DIAGNOSIS — R042 Hemoptysis: Secondary | ICD-10-CM

## 2017-05-23 LAB — RENAL FUNCTION PANEL
Albumin: 2 g/dL — ABNORMAL LOW (ref 3.5–5.0)
Anion gap: 10 (ref 5–15)
BUN: 49 mg/dL — ABNORMAL HIGH (ref 6–20)
CHLORIDE: 99 mmol/L — AB (ref 101–111)
CO2: 25 mmol/L (ref 22–32)
CREATININE: 4.72 mg/dL — AB (ref 0.61–1.24)
Calcium: 8.6 mg/dL — ABNORMAL LOW (ref 8.9–10.3)
GFR, EST AFRICAN AMERICAN: 11 mL/min — AB (ref >60)
GFR, EST NON AFRICAN AMERICAN: 10 mL/min — AB (ref >60)
Glucose, Bld: 189 mg/dL — ABNORMAL HIGH (ref 65–99)
POTASSIUM: 4.2 mmol/L (ref 3.5–5.1)
Phosphorus: 3 mg/dL (ref 2.5–4.6)
Sodium: 134 mmol/L — ABNORMAL LOW (ref 135–145)

## 2017-05-23 LAB — CBC
HEMATOCRIT: 25.8 % — AB (ref 39.0–52.0)
Hemoglobin: 8.1 g/dL — ABNORMAL LOW (ref 13.0–17.0)
MCH: 29.7 pg (ref 26.0–34.0)
MCHC: 31.4 g/dL (ref 30.0–36.0)
MCV: 94.5 fL (ref 78.0–100.0)
PLATELETS: 54 10*3/uL — AB (ref 150–400)
RBC: 2.73 MIL/uL — AB (ref 4.22–5.81)
RDW: 18.6 % — ABNORMAL HIGH (ref 11.5–15.5)
WBC: 10.4 10*3/uL (ref 4.0–10.5)

## 2017-05-23 LAB — CULTURE, BLOOD (ROUTINE X 2)
SPECIAL REQUESTS: ADEQUATE
Special Requests: ADEQUATE

## 2017-05-23 LAB — GLUCOSE, CAPILLARY
Glucose-Capillary: 145 mg/dL — ABNORMAL HIGH (ref 65–99)
Glucose-Capillary: 168 mg/dL — ABNORMAL HIGH (ref 65–99)
Glucose-Capillary: 118 mg/dL — ABNORMAL HIGH (ref 65–99)
Glucose-Capillary: 277 mg/dL — ABNORMAL HIGH (ref 65–99)

## 2017-05-23 MED ORDER — ACETAMINOPHEN 325 MG PO TABS
650.0000 mg | ORAL_TABLET | Freq: Three times a day (TID) | ORAL | Status: DC | PRN
  Administered 2017-05-23: 650 mg via ORAL
  Filled 2017-05-23: qty 2

## 2017-05-23 NOTE — Progress Notes (Signed)
Physical Therapy Treatment Patient Details Name: Ronald [name] MRN: [id number] DOB: September 02, 1922 Today's Date: 05/23/2017    History of Present Illness Pt is a 84 y/o male admitted secondary to feeling unwell and found to be septic. PMH including but not limited to a-fib, CHF, COPD, DM, HTN, PVD, ESRD, AVR in 2012.    PT Comments    Pt continues to require physical assistance for bed mobility and transfers. Pt tolerated sitting EOB without UE supports while brushing his teeth with assist for set-up. Pt would continue to benefit from skilled physical therapy services at this time while admitted and after d/c to address the below listed limitations in order to improve overall safety and independence with functional mobility.  Of note, pt with brief bout of nose bleed at beginning of session which stopped with compression. Pt's RN was notified.    Follow Up Recommendations  SNF;Supervision/Assistance - 24 hour     Equipment Recommendations  None recommended by PT    Recommendations for Other Services       Precautions / Restrictions Precautions Precautions: Fall Precaution Comments: very HOH Restrictions Weight Bearing Restrictions: No    Mobility  Bed Mobility Overal bed mobility: Needs Assistance Bed Mobility: Supine to Sit     Supine to sit: Min assist     General bed mobility comments: min A for trunk elevation, increased time and effort  Transfers Overall transfer level: Needs assistance Equipment used: Rolling walker (2 wheeled) Transfers: Sit to/from [facility] Sit to Stand: Min assist;From elevated surface Stand pivot transfers: Min assist       General transfer comment: increased time and effort, good technique, assist to power into standing from sitting EOB and assist for stability with pivotal movements to chair  Ambulation/Gait                 Stairs            Wheelchair Mobility    Modified Rankin (Stroke Patients  Only)       Balance Overall balance assessment: Needs assistance Sitting-balance support: Feet supported;No upper extremity supported Sitting balance-Leahy Scale: Fair Sitting balance - Comments: pt able to sit EOB to brush teeth without UE supports with min guard for safety   Standing balance support: Bilateral upper extremity supported Standing balance-Leahy Scale: Poor Standing balance comment: requires UE support                            Cognition Arousal/Alertness: Awake/alert Behavior During Therapy: WFL for tasks assessed/performed Overall Cognitive Status: Within Functional Limits for tasks assessed                                        Exercises      General Comments        Pertinent Vitals/Pain Pain Assessment: No/denies pain    Home Living                      Prior Function            PT Goals (current goals can now be found in the care plan section) Acute Rehab PT Goals PT Goal Formulation: With patient Time For Goal Achievement: 06/05/17 Potential to Achieve Goals: Fair Progress towards PT goals: Progressing toward goals    Frequency    Min 2X/week  PT Plan Current plan remains appropriate    Co-evaluation              AM-PAC PT "6 Clicks" Daily Activity  Outcome Measure  Difficulty turning over in bed (including adjusting bedclothes, sheets and blankets)?: Unable Difficulty moving from lying on back to sitting on the side of the bed? : Unable Difficulty sitting down on and standing up from a chair with arms (e.g., wheelchair, bedside commode, etc,.)?: Unable Help needed moving to and from a bed to chair (including a wheelchair)?: A Little Help needed walking in hospital room?: A Lot Help needed climbing 3-5 steps with a railing? : Total 6 Click Score: 9    End of Session Equipment Utilized During Treatment: Gait belt Activity Tolerance: Patient tolerated treatment well Patient left:  in chair;with call bell/phone within reach;with chair alarm set Nurse Communication: Mobility status;Other (comment)(pt with nose bleed during session) PT Visit Diagnosis: Other abnormalities of gait and mobility (R26.89)     Time: 1010-1035 PT Time Calculation (min) (ACUTE ONLY): 25 min  Charges:  $Therapeutic Activity: 23-37 mins                    G Codes:       [place], [place], [place] 05/23/2017, 12:22 PM

## 2017-05-23 NOTE — Progress Notes (Signed)
[facility]                                                                              Patient Demographics  Ronald [name], is a 84 y.o. male, DOB - 1922/03/12, EHM:[id number]  Admit date - 05/20/2017   Admitting Physician Ronald Genera, MD  Outpatient Primary MD for the patient is Ronald Baton, MD  Outpatient specialists:   LOS - 3  days   Medical records reviewed and are as summarized below:    Chief Complaint  Patient presents with  . Emesis  . Blood Infection       Brief summary   Per admission note by P CCM, Dr. Chase Caller on [age] year old male with PMH as below, which is significant for smoker since 84 yrs old, ESRD on HD (only for 4-6 weeks) MWF, AF not on AC, COPD, diastolic CHF, DM, recent GI bleed and PVD. He was in his usual state of health until 3/25 when family visited him at [facility] and he was "not feeling well". He received HD and was lethargic before and after. He complained of being very tired and fatigued. Then 3/26 family went to visit him again and noticed blood all over the front of his shirt, thought to be emesis. EMS was called and he was transported to [facility] ED. Upon arrival to ED he was noted to be febrile 103.3,  Tachypneic, and hypotensive. He was started on O2, IVF, and ABX. CT concerning for pneumonia. Based on hypotension and concern for CRRT need he was transferred to [facility] ICU.   Patient was admitted by critical care service, subsequently transferred to [facility], assumed care on 3/28     Assessment & Plan    Principal Problem:   Bacteremia due to methicillin susceptible Staphylococcus aureus (MSSA)/sepsis/septic shock -Source possibly due to tunneled dialysis catheter, vascular surgery was consulted  -[facility] has been removed -ID following, repeat blood cultures negative so far -2D echo showed EF of 70 to 65-70%, study not sufficient to rule out endocarditis -Continue Ancef  Active Problems: Right  lower lung pneumonia versus atelectasis -Patient met sepsis criteria at the time of admission with tachypnea, hypotensive, febrile, blood cultures positive for MSSA, chest x-ray showed RLL infiltrates versus atelectasis -Continue IV Ancef  ESRD on hemodialysis - Nephrology consulted hemodialysis MWF  History of aortic stenosis S/P aortic valve replacement: 2012     Protein-calorie malnutrition, severe -Nutrition consult   Acute encephalopathy/delirium -Has significant hearing deficit, but appears close to his baseline.   -Acute encephalopathy improving, likely due to #1 and #2, possible underlying dementia  Blood on the showed,?  Hemoptysis, acute on chronic normocytic anemia -Patient has a history of epistaxis, had melanotic stools on admission 2 weeks ago and underwent normal EGD. -GI was consulted, currently no acute issues, no plans of further GI testing  Chronic atrial fibrillation -Previously on Coumadin which was discontinued during the previous admission due to GI bleed  Pancreatic body lesion seen on CT CT abdomen showed 2.2 cm rounded density in the pancreatic body -No plans of for further workup per GI   Code  Status: Full code DVT Prophylaxis:   SCD's Family Communication: No family member at the bedside   Disposition Plan: PT evaluation  Time Spent in minutes 25 minutes  Procedures:  TDC removal HD  Consultants:   Nephrology Vascular surgery Infectious disease GI  Antimicrobials:   IV cefazolin 3/27   Medications  Scheduled Meds: . [START ON 05/28/2017] darbepoetin (ARANESP) injection - DIALYSIS  200 mcg Intravenous Q Wed-HD  . feeding supplement (NEPRO CARB STEADY)  237 mL Oral TID BM  . pantoprazole  40 mg Oral Q0600  . umeclidinium-vilanterol  1 puff Inhalation Daily   Continuous Infusions: . sodium chloride    . sodium chloride    .  ceFAZolin (ANCEF) IV 1 g (05/22/17 1738)   PRN Meds:.sodium chloride, sodium chloride, alteplase, heparin,  ipratropium-albuterol, lidocaine (PF), lidocaine-prilocaine, pentafluoroprop-tetrafluoroeth   Antibiotics   Anti-infectives (From admission, onward)   Start     Dose/Rate Route Frequency Ordered Stop   05/21/17 1800  ceFAZolin (ANCEF) IVPB 1 g/50 mL premix     1 g 100 mL/hr over 30 Minutes Intravenous Every 24 hours 05/21/17 1122     05/21/17 0000  piperacillin-tazobactam (ZOSYN) IVPB 3.375 g  Status:  Discontinued     3.375 g 12.5 mL/hr over 240 Minutes Intravenous Every 12 hours 05/20/17 1909 05/21/17 1122   05/20/17 1515  vancomycin (VANCOCIN) 1,500 mg in sodium chloride 0.9 % 500 mL IVPB     1,500 mg 250 mL/hr over 120 Minutes Intravenous  Once 05/20/17 1508 05/20/17 1845   05/20/17 1500  piperacillin-tazobactam (ZOSYN) IVPB 2.25 g     2.25 g 100 mL/hr over 30 Minutes Intravenous STAT 05/20/17 1445 05/20/17 1618   05/20/17 1445  clindamycin (CLEOCIN) IVPB 600 mg  Status:  Discontinued     600 mg 100 mL/hr over 30 Minutes Intravenous Every 8 hours 05/20/17 1443 05/20/17 1916        Subjective:   Ronald [name] was seen and examined today.  Hard of hearing otherwise no acute issues, no fevers.  No fevers, difficult  to obtain review of system from the patient, possibly has underlying dementia   Objective:   Vitals:   05/23/17 1300 05/23/17 1330 05/23/17 1400 05/23/17 1430  BP: (!) 137/57 137/68 (!) 141/65 (!) 151/68  Pulse: (!) 105 (!) 104 (!) 106 (!) 101  Resp:      Temp:      TempSrc:      SpO2:      Weight:        Intake/Output Summary (Last 24 hours) at 05/23/2017 1450 Last data filed at 05/23/2017 0900 Gross per 24 hour  Intake 590 ml  Output 0 ml  Net 590 ml     Wt Readings from Last 3 Encounters:  05/23/17 73 kg (160 lb 15 oz)  05/09/17 69.2 kg (152 lb 8.9 oz)  05/05/17 71.7 kg (158 lb)     Exam   General: Alert and oriented x 3, NAD, hearing deficit  Eyes:   HEENT:   Cardiovascular: S1 S2 auscultated, soft systolic murmur, irregularly  irregular No pedal edema b/l  Respiratory: CTA B  Gastrointestinal: Soft, nontender, nondistended, + bowel sounds  Ext: no pedal edema bilaterally  Neuro no new deficit  Musculoskeletal: No digital cyanosis, clubbing  Skin: No rashes  Psych: alert and awake   Data Reviewed:  I have personally reviewed following labs and imaging studies  Micro Results Recent Results (from the past 240 hour(s))  Culture, blood (Routine x  2)     Status: Abnormal   Collection Time: 05/20/17  1:36 PM  Result Value Ref Range Status   Specimen Description   Final    BLOOD LEFT HAND Performed at [facility] [address] [place], [place] [address]    Special Requests   Final    BOTTLES DRAWN AEROBIC AND ANAEROBIC Blood Culture adequate volume Performed at [facility] [address], [place], [place] [address]    Culture  Setup Time   Final    GRAM POSITIVE COCCI AEROBIC BOTTLE ONLY CRITICAL RESULT CALLED TO, READ BACK BY AND VERIFIED WITH: Shellee Milo [facility] 05/21/17 0427 JDW Performed at [facility], [address] [place], [place] [address]    Culture STAPHYLOCOCCUS AUREUS (A)  Final   Report Status 05/23/2017 FINAL  Final   Organism ID, Bacteria STAPHYLOCOCCUS AUREUS  Final      Susceptibility   Staphylococcus aureus - MIC*    CIPROFLOXACIN <=0.5 SENSITIVE Sensitive     ERYTHROMYCIN <=0.25 SENSITIVE Sensitive     GENTAMICIN <=0.5 SENSITIVE Sensitive     OXACILLIN <=0.25 SENSITIVE Sensitive     TETRACYCLINE <=1 SENSITIVE Sensitive     VANCOMYCIN 1 SENSITIVE Sensitive     TRIMETH/SULFA <=10 SENSITIVE Sensitive     CLINDAMYCIN <=0.25 SENSITIVE Sensitive     RIFAMPIN <=0.5 SENSITIVE Sensitive     Inducible Clindamycin NEGATIVE Sensitive     * STAPHYLOCOCCUS AUREUS  Blood Culture ID Panel (Reflexed)     Status: Abnormal   Collection Time: 05/20/17  1:36 PM  Result Value Ref Range Status   Enterococcus species NOT DETECTED NOT DETECTED Final     Listeria monocytogenes NOT DETECTED NOT DETECTED Final   Staphylococcus species DETECTED (A) NOT DETECTED Final    Comment: CRITICAL RESULT CALLED TO, READ BACK BY AND VERIFIED WITH: L SEAY PHARMD 05/21/17 0427 JDW    Staphylococcus aureus DETECTED (A) NOT DETECTED Final    Comment: Methicillin (oxacillin) susceptible Staphylococcus aureus (MSSA). Preferred therapy is anti staphylococcal beta lactam antibiotic (Cefazolin or Nafcillin), unless clinically contraindicated. CRITICAL RESULT CALLED TO, READ BACK BY AND VERIFIED WITH: L SEAY PHARMD 05/21/17 0427 JDW    Methicillin resistance NOT DETECTED NOT DETECTED Final   Streptococcus species NOT DETECTED NOT DETECTED Final   Streptococcus agalactiae NOT DETECTED NOT DETECTED Final   Streptococcus pneumoniae NOT DETECTED NOT DETECTED Final   Streptococcus pyogenes NOT DETECTED NOT DETECTED Final   Acinetobacter baumannii NOT DETECTED NOT DETECTED Final   Enterobacteriaceae species NOT DETECTED NOT DETECTED Final   Enterobacter cloacae complex NOT DETECTED NOT DETECTED Final   Escherichia coli NOT DETECTED NOT DETECTED Final   Klebsiella oxytoca NOT DETECTED NOT DETECTED Final   Klebsiella pneumoniae NOT DETECTED NOT DETECTED Final   Proteus species NOT DETECTED NOT DETECTED Final   Serratia marcescens NOT DETECTED NOT DETECTED Final   Haemophilus influenzae NOT DETECTED NOT DETECTED Final   Neisseria meningitidis NOT DETECTED NOT DETECTED Final   Pseudomonas aeruginosa NOT DETECTED NOT DETECTED Final   Candida albicans NOT DETECTED NOT DETECTED Final   Candida glabrata NOT DETECTED NOT DETECTED Final   Candida krusei NOT DETECTED NOT DETECTED Final   Candida parapsilosis NOT DETECTED NOT DETECTED Final   Candida tropicalis NOT DETECTED NOT DETECTED Final    Comment: Performed at [facility], [place]. [address], [place], [place] [address]  Culture, blood (Routine x 2)     Status: Abnormal   Collection  Time: 05/20/17  1:50 PM  Result  Value Ref Range Status   Specimen Description   Final    BLOOD RIGHT ANTECUBITAL Performed at [facility] [address], [place], [place] [address]    Special Requests   Final    BOTTLES DRAWN AEROBIC AND ANAEROBIC Blood Culture adequate volume Performed at [facility] [address], [place], [place] [address]    Culture  Setup Time   Final    GRAM POSITIVE COCCI ANAEROBIC BOTTLE ONLY CRITICAL RESULT CALLED TO, READ BACK BY AND VERIFIED WITH: L SEAY PHARMD 05/21/17 0427 JDW    Culture (A)  Final    STAPHYLOCOCCUS AUREUS SUSCEPTIBILITIES PERFORMED ON PREVIOUS CULTURE WITHIN THE LAST 5 DAYS. Performed at [facility], [place] [address], [place], [place] [address]    Report Status 05/23/2017 FINAL  Final  MRSA PCR Screening     Status: None   Collection Time: 05/22/17  2:21 PM  Result Value Ref Range Status   MRSA by PCR NEGATIVE NEGATIVE Final    Comment:        The GeneXpert MRSA Assay (FDA approved for NASAL specimens only), is one component of a comprehensive MRSA colonization surveillance program. It is not intended to diagnose MRSA infection nor to guide or monitor treatment for MRSA infections. Performed at [facility], [place] [address], [place], [place] [address]     Radiology Reports Ct Abdomen Pelvis Wo Contrast  Result Date: 05/20/2017 CLINICAL DATA:  Hematemesis.  Possible sepsis. EXAM: CT CHEST, ABDOMEN AND PELVIS WITHOUT CONTRAST TECHNIQUE: Multidetector CT imaging of the chest, abdomen and pelvis was performed following the standard protocol without IV contrast. COMPARISON:  CT scan of Jun 29, 2011. FINDINGS: CT CHEST FINDINGS Cardiovascular: Atherosclerosis of thoracic aorta is noted without aneurysm formation. Left internal jugular catheter is noted with tip at cavoatrial junction. Normal cardiac size. No pericardial effusion. Status post aortic valve replacement. Mediastinum/Nodes: No enlarged mediastinal,  hilar, or axillary lymph nodes. Thyroid gland, trachea, and esophagus demonstrate no significant findings. Lungs/Pleura: No pneumothorax is noted. Moderate right pleural effusion is noted with adjacent atelectasis or infiltrate in the right lower lobe. Minimal left pleural effusion is noted with adjacent subsegmental atelectasis. Musculoskeletal: No chest wall mass or suspicious bone lesions identified. CT ABDOMEN PELVIS FINDINGS Hepatobiliary: No focal liver abnormality is seen. No gallstones, gallbladder wall thickening, or biliary dilatation. Pancreas: No ductal dilatation is noted. 2.2 cm rounded low density is noted in pancreatic body which is slightly enlarged compared to prior exam. Spleen: Normal in size without focal abnormality. Adrenals/Urinary Tract: Adrenal glands are unremarkable. Stable left renal cyst. No hydronephrosis or renal obstruction is noted. No renal or ureteral calculi are noted. Urinary bladder is unremarkable. Stomach/Bowel: The stomach appears normal. There is no evidence of bowel obstruction or inflammation. Vascular/Lymphatic: Aortic atherosclerosis. No enlarged abdominal or pelvic lymph nodes. Reproductive: Prostate is unremarkable. Other: No abdominal wall hernia or abnormality. No abdominopelvic ascites. Musculoskeletal: No acute or significant osseous findings. IMPRESSION: 2.2 cm rounded density is noted in pancreatic body which is slightly enlarged compared to prior exam of 2013. Recommend follow up pre and post contrast MRI/MRCP or pancreatic protocol CT in 2 years. This recommendation follows ACR consensus guidelines: Management of Incidental Pancreatic Cysts: A White Paper of the ACR Incidental Findings Committee. [place] 9735;32:[id number]. Moderate right pleural effusion is noted with adjacent atelectasis or infiltrate in the right lower lobe. Minimal left pleural effusion is  noted with adjacent subsegmental atelectasis. Stable left renal cyst. Aortic Atherosclerosis  (ICD10-I70.0). Electronically Signed   By: Marijo Conception, M.D.   On: 05/20/2017 17:27   Ct Chest Wo Contrast  Result Date: 05/20/2017 CLINICAL DATA:  Hematemesis.  Possible sepsis. EXAM: CT CHEST, ABDOMEN AND PELVIS WITHOUT CONTRAST TECHNIQUE: Multidetector CT imaging of the chest, abdomen and pelvis was performed following the standard protocol without IV contrast. COMPARISON:  CT scan of Jun 29, 2011. FINDINGS: CT CHEST FINDINGS Cardiovascular: Atherosclerosis of thoracic aorta is noted without aneurysm formation. Left internal jugular catheter is noted with tip at cavoatrial junction. Normal cardiac size. No pericardial effusion. Status post aortic valve replacement. Mediastinum/Nodes: No enlarged mediastinal, hilar, or axillary lymph nodes. Thyroid gland, trachea, and esophagus demonstrate no significant findings. Lungs/Pleura: No pneumothorax is noted. Moderate right pleural effusion is noted with adjacent atelectasis or infiltrate in the right lower lobe. Minimal left pleural effusion is noted with adjacent subsegmental atelectasis. Musculoskeletal: No chest wall mass or suspicious bone lesions identified. CT ABDOMEN PELVIS FINDINGS Hepatobiliary: No focal liver abnormality is seen. No gallstones, gallbladder wall thickening, or biliary dilatation. Pancreas: No ductal dilatation is noted. 2.2 cm rounded low density is noted in pancreatic body which is slightly enlarged compared to prior exam. Spleen: Normal in size without focal abnormality. Adrenals/Urinary Tract: Adrenal glands are unremarkable. Stable left renal cyst. No hydronephrosis or renal obstruction is noted. No renal or ureteral calculi are noted. Urinary bladder is unremarkable. Stomach/Bowel: The stomach appears normal. There is no evidence of bowel obstruction or inflammation. Vascular/Lymphatic: Aortic atherosclerosis. No enlarged abdominal or pelvic lymph nodes. Reproductive: Prostate is unremarkable. Other: No abdominal wall hernia or  abnormality. No abdominopelvic ascites. Musculoskeletal: No acute or significant osseous findings. IMPRESSION: 2.2 cm rounded density is noted in pancreatic body which is slightly enlarged compared to prior exam of 2013. Recommend follow up pre and post contrast MRI/MRCP or pancreatic protocol CT in 2 years. This recommendation follows ACR consensus guidelines: Management of Incidental Pancreatic Cysts: A White Paper of the ACR Incidental Findings Committee. [place] 0272;[id number]. Moderate right pleural effusion is noted with adjacent atelectasis or infiltrate in the right lower lobe. Minimal left pleural effusion is noted with adjacent subsegmental atelectasis. Stable left renal cyst. Aortic Atherosclerosis (ICD10-I70.0). Electronically Signed   By: Marijo Conception, M.D.   On: 05/20/2017 17:27   Dg Chest Portable 1 View  Result Date: 05/20/2017 CLINICAL DATA:  Suspicion of sepsis. EXAM: PORTABLE CHEST 1 VIEW COMPARISON:  Portable chest x-ray of April 19, 2017 FINDINGS: The lungs are well-expanded. The interstitial markings remain increased bilaterally greatest on the right. The cardiac silhouette remains mildly enlarged. The pulmonary vascularity is engorged. The dual-lumen dialysis catheter tip projects at the cavoatrial junction. The sternal wires and left atrial appendage clip are stable. IMPRESSION: CHF with mild interstitial edema. One cannot exclude basilar pneumonia on the right. Electronically Signed   By: David  Martinique M.D.   On: 05/20/2017 15:05    Lab Data:  CBC: Recent Labs  Lab 05/20/17 1352 05/20/17 1359 05/21/17 0403 05/21/17 1419 05/23/17 1240  WBC 18.9*  --  13.9* 20.1* 10.4  NEUTROABS 17.6*  --   --   --   --   HGB 9.6* 10.5* 8.4* 9.6* 8.1*  HCT 30.8* 31.0* 27.0* 30.8* 25.8*  MCV 99.0  --  96.8 96.3 94.5  PLT 100*  --  70* 74* 54*   Basic Metabolic Panel: Recent Labs  Lab 05/20/17 1352  05/20/17 1359 05/21/17 0403 05/23/17 1240  NA 137 138 136 134*  K  4.0 4.0 3.9 4.2  CL 99* 97* 99* 99*  CO2 25  --  23 25  GLUCOSE 62* 60* 144* 189*  BUN 28* 31* 37* 49*  CREATININE 3.94* 4.00* 4.42* 4.72*  CALCIUM 8.0*  --  7.5* 8.6*  MG  --   --  1.9  --   PHOS  --   --  5.7* 3.0   GFR: Estimated Creatinine Clearance: 10.1 mL/min (A) (by C-G formula based on SCr of 4.72 mg/dL (H)). Liver Function Tests: Recent Labs  Lab 05/20/17 1352 05/23/17 1240  AST 43*  --   ALT 18  --   ALKPHOS 79  --   BILITOT 1.2  --   PROT 6.5  --   ALBUMIN 2.5* 2.0*   No results for input(s): LIPASE, AMYLASE in the last 168 hours. No results for input(s): AMMONIA in the last 168 hours. Coagulation Profile: Recent Labs  Lab 05/20/17 1352 05/21/17 0403  INR 1.37 1.47   Cardiac Enzymes: No results for input(s): CKTOTAL, CKMB, CKMBINDEX, TROPONINI in the last 168 hours. BNP (last 3 results) No results for input(s): PROBNP in the last 8760 hours. HbA1C: No results for input(s): HGBA1C in the last 72 hours. CBG: Recent Labs  Lab 05/22/17 0016 05/22/17 0751 05/22/17 1719 05/23/17 0749 05/23/17 1207  GLUCAP 107* 72 180* 145* 168*   Lipid Profile: No results for input(s): CHOL, HDL, LDLCALC, TRIG, CHOLHDL, LDLDIRECT in the last 72 hours. Thyroid Function Tests: No results for input(s): TSH, T4TOTAL, FREET4, T3FREE, THYROIDAB in the last 72 hours. Anemia Panel: No results for input(s): VITAMINB12, FOLATE, FERRITIN, TIBC, IRON, RETICCTPCT in the last 72 hours. Urine analysis:    Component Value Date/Time   COLORURINE AMBER (A) 05/05/2017 1959   APPEARANCEUR TURBID (A) 05/05/2017 1959   LABSPEC  05/05/2017 1959    TEST NOT REPORTED DUE TO COLOR INTERFERENCE OF URINE PIGMENT   PHURINE  05/05/2017 1959    TEST NOT REPORTED DUE TO COLOR INTERFERENCE OF URINE PIGMENT   GLUCOSEU (A) 05/05/2017 1959    TEST NOT REPORTED DUE TO COLOR INTERFERENCE OF URINE PIGMENT   HGBUR (A) 05/05/2017 1959    TEST NOT REPORTED DUE TO COLOR INTERFERENCE OF URINE PIGMENT    BILIRUBINUR (A) 05/05/2017 1959    TEST NOT REPORTED DUE TO COLOR INTERFERENCE OF URINE PIGMENT   KETONESUR (A) 05/05/2017 1959    TEST NOT REPORTED DUE TO COLOR INTERFERENCE OF URINE PIGMENT   PROTEINUR (A) 05/05/2017 1959    TEST NOT REPORTED DUE TO COLOR INTERFERENCE OF URINE PIGMENT   UROBILINOGEN 1.0 12/13/2013 1556   NITRITE (A) 05/05/2017 1959    TEST NOT REPORTED DUE TO COLOR INTERFERENCE OF URINE PIGMENT   LEUKOCYTESUR (A) 05/05/2017 1959    TEST NOT REPORTED DUE TO COLOR INTERFERENCE OF URINE PIGMENT     Marquie Aderhold M.D. [facility] 05/23/2017, 2:50 PM  Pager: [phone] Between 7am to 7pm - call Pager - [phone]  After 7pm go to [email or web address] - password [email or web address]  Call night coverage person covering after 7pm

## 2017-05-23 NOTE — Progress Notes (Signed)
   Tunnel catheter is out at this point the left arm AV graft is working.  Staples have been removed from his left upper arm incisions.  He can follow-up with vascular on a as needed basis.  Brandon C. Donzetta Matters, MD [facility] Office: [phone] Pager: [phone]

## 2017-05-23 NOTE — Consult Note (Signed)
   [facility] CM Inpatient Consult   05/23/2017  Draedyn Weidinger [facility] September 08, 1922 [id number]    Mr. Brunton is active with [facility] services. He is followed by [facility]. Please see chart review tab then encounters for further patient outreach details by [facility].   Spoke with inpatient RNCM to make aware [facility] is following.   Noted palliative care consult is pending.   Will continue to follow.    Marthenia Rolling, MSN-Ed, RN,BSN [facility] Liaison [phone]

## 2017-05-23 NOTE — Progress Notes (Signed)
No charge note.   PMT consult received. Palliative care team had long meeting with multiple members of patient's family during previous admission to discuss goals of care. Please see note from 05/09/17. GOC were clear for full scope care/full code/continue dialysis.   Called patient's son to setup goals of care meeting for this admission and he tells me the goals of care are the same. "Dad still wants to fight" and that he is not interested in stopping dialysis. Therefore, no goals of care meeting setup with family.  PMT will shadow chart for decline. Please call if needed.  Thank you for this consult.  Juel Burrow, DNP, AGNP-C Palliative Medicine Team Team Phone # [phone]

## 2017-05-23 NOTE — Progress Notes (Signed)
[name], male   DOB: December 29, 1922, 85 y.o.   MRN: [id number]          [facility] for Infectious Disease    Date of Admission:  05/20/2017   Total days of antibiotics 3         Mr. Helbling seems to be improving on therapy for MSSA bacteremia.  Repeat blood cultures are negative so far.  He has indicated that he would like to continue on hemodialysis.  When I spoke to him about TEE he preferred to avoid the procedure.  I recommend a 6 weeks of cefazolin dosed after hemodialysis to treat for possible endocarditis since he does have a tissue prosthetic aortic valve.  I will sign off now and arrange follow-up in our clinic.  Diagnosis: Bacteremia  Culture Result: MSSA  Allergies  Allergen Reactions  . Aspirin Anaphylaxis, Shortness Of Breath, Rash and Other (See Comments)    "broke out in white welts; red blotches neck and face; windpipe closed; ~ 1962"  . Tape Other (See Comments)    Patient's skin is VERY THIN and tears and bruises easily!!    OPAT Orders Discharge antibiotics: Per pharmacy protocol cefazolin  Duration: 6 weeks End Date: 07/01/2017  [facility] Care Per Protocol:  Labs weekly while on IV antibiotics: _x_ CBC with differential _x_ BMP __ CMP __ CRP __ ESR __ Vancomycin trough  Fax weekly labs to [phone]  Clinic Follow Up Appt: 07/01/2017      Michel Bickers, [place] [facility] [phone] pager   [phone] cell 05/23/2017, 3:53 PM

## 2017-05-23 NOTE — Progress Notes (Signed)
 [facility] Progress Note   Subjective: Frail, elderly male EXTREMELY HOH! He has no complaints, says appetite is "so,so".   Objective Vitals:   05/22/17 1005 05/22/17 2038 05/23/17 0448 05/23/17 0848  BP:  131/60 (!) 144/63   Pulse:  95 78   Resp:  16 15   Temp:  99.6 F (37.6 C) 98.4 F (36.9 C)   TempSrc:      SpO2: 98% 100% 99% 98%  Weight:  70 kg (154 lb 5.2 oz)     Physical Exam General: Frail, NAD Heart: Regularly irregular AFib on monitor, rate is controlled.  Lungs: CTAB A/P Abdomen: active BS Extremities: No LE edema Dialysis Access: LUA AVF + bruit   Additional Objective Labs: Basic Metabolic Panel: Recent Labs  Lab 05/20/17 1352 05/20/17 1359 05/21/17 0403  NA 137 138 136  K 4.0 4.0 3.9  CL 99* 97* 99*  CO2 25  --  23  GLUCOSE 62* 60* 144*  BUN 28* 31* 37*  CREATININE 3.94* 4.00* 4.42*  CALCIUM 8.0*  --  7.5*  PHOS  --   --  5.7*   Liver Function Tests: Recent Labs  Lab 05/20/17 1352  AST 43*  ALT 18  ALKPHOS 79  BILITOT 1.2  PROT 6.5  ALBUMIN 2.5*   No results for input(s): LIPASE, AMYLASE in the last 168 hours. CBC: Recent Labs  Lab 05/20/17 1352 05/20/17 1359 05/21/17 0403 05/21/17 1419  WBC 18.9*  --  13.9* 20.1*  NEUTROABS 17.6*  --   --   --   HGB 9.6* 10.5* 8.4* 9.6*  HCT 30.8* 31.0* 27.0* 30.8*  MCV 99.0  --  96.8 96.3  PLT 100*  --  70* 74*   Blood Culture    Component Value Date/Time   SDES  05/20/2017 1350    BLOOD RIGHT ANTECUBITAL Performed at [facility], [place] [address], [place], [place] [address]    SPECREQUEST  05/20/2017 1350    BOTTLES DRAWN AEROBIC AND ANAEROBIC Blood Culture adequate volume Performed at [facility] [address] [place], [place] [address]    CULT (A) 05/20/2017 1350    STAPHYLOCOCCUS AUREUS SUSCEPTIBILITIES PERFORMED ON PREVIOUS CULTURE WITHIN THE LAST 5 DAYS. Performed at [facility], [place] [address], [place],  [place] [address]    REPTSTATUS 05/23/2017 FINAL 05/20/2017 1350    Cardiac Enzymes: No results for input(s): CKTOTAL, CKMB, CKMBINDEX, TROPONINI in the last 168 hours. CBG: Recent Labs  Lab 05/21/17 1630 05/22/17 0016 05/22/17 0751 05/22/17 1719 05/23/17 0749  GLUCAP 161* 107* 72 180* 145*   Iron Studies: No results for input(s): IRON, TIBC, TRANSFERRIN, FERRITIN in the last 72 hours. @lablastinr3 @ Studies/Results: No results found. Medications: . sodium chloride    . sodium chloride    .  ceFAZolin (ANCEF) IV 1 g (05/22/17 1738)   . [START ON 05/28/2017] darbepoetin (ARANESP) injection - DIALYSIS  200 mcg Intravenous Q Wed-HD  . feeding supplement (NEPRO CARB STEADY)  237 mL Oral TID BM  . pantoprazole  40 mg Oral Q0600  . umeclidinium-vilanterol  1 puff Inhalation Daily     [facility].4 hours  HD Bath 2K 2.5 Ca DialyzerF180 BFR 400 Heparinnone. AccessTDC and maturing AVG Mircera 225 q 2 weeks, to start 4.1, Mircera 100 mcg given 3/18. No VDRA or binders No heparin  1 Septic shock 2/2 MSSA Bacteremia: likely from [facility] which has been removed, greatly appreciate VVS.  No evidence of endocarditis by TTE. ID following, no plans for TEE. Continue cefazolin.  2. ? GI bleed- story with blood all over shirt upon ED arrival but recent EGD was WNL.  NO plans for further testing per GI.   3ESRD: MWF- HD today on schedule. Continue using AVF.  4Hypertension: BP appears well controlled.  hold BP meds 5. Anemia of ESRD:on Max ESA at OP center, dose Aranesp 200 mcg q week (given 3/27) HGB 9.6 today. Follow HGB.  6. Metabolic Bone Disease:Phos 5.7 Ca 7.5 C Ca 8.7. Start calcium acetate 667 mg PO TID AC.  7.  Afib: Off AC 8. Nutrition: Albumin 2.5. Poor prognostic sign. Add prostat, renal vits. On renal diet w/fluid restrictions.  9.  Dispo: he is frail and to be frank he has been repeatedly admitted to hospital essentially since starting dialysis.  I think that he needs another pall care c/s to define scope of care. I doubt that HD is contributing to QOL at this point and may actually be perpetuating problems for him.    H.  NP-C 05/23/2017, 9:48 AM  [employer] [phone]
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
## 2017-05-24 LAB — GLUCOSE, CAPILLARY
Glucose-Capillary: 128 mg/dL — ABNORMAL HIGH (ref 65–99)
Glucose-Capillary: 143 mg/dL — ABNORMAL HIGH (ref 65–99)

## 2017-05-24 MED ORDER — DARBEPOETIN ALFA 200 MCG/0.4ML IJ SOSY
PREFILLED_SYRINGE | INTRAMUSCULAR | Status: AC
  Administered 2017-05-24: 200 ug
  Filled 2017-05-24: qty 0.4

## 2017-05-24 MED ORDER — CEFAZOLIN IV (FOR PTA / DISCHARGE USE ONLY): 2.0000 g | INTRAVENOUS | 0 refills | Status: AC

## 2017-05-24 MED ORDER — DARBEPOETIN ALFA 200 MCG/0.4ML IJ SOSY: 200.0000 ug | PREFILLED_SYRINGE | INTRAMUSCULAR | Status: DC

## 2017-05-24 NOTE — Progress Notes (Signed)
PHARMACY CONSULT NOTE FOR:  OUTPATIENT  PARENTERAL ANTIBIOTIC THERAPY (OPAT)  Indication: MSSA bactermia Regimen: cefazolin 2g IV q MWF with HD End date: 07/01/2017  IV antibiotic discharge orders are pended. To discharging provider:  please sign these orders via discharge navigator,  Select New Orders & click on the button choice - Manage This Unsigned Work.     Thank you for allowing pharmacy to be a part of this patient's care.  Carnella Guadalajara 05/24/2017, 10:33 AM

## 2017-05-24 NOTE — Progress Notes (Signed)
[facility]                                                                              Patient Demographics  Ronald [name], is a 84 y.o. male, DOB - [birth year], YBO:[id number]  Admit date - 05/20/2017   Admitting Physician Jesus Genera, MD  Outpatient Primary MD for the patient is [name], MD  Outpatient specialists:   LOS - 4  days   Medical records reviewed and are as summarized below:    Chief Complaint  Patient presents with  . Emesis  . Blood Infection       Brief summary   Per admission note by P CCM, Dr. Chase Caller on [age] year old male with PMH as below, which is significant for smoker since 84 yrs old, ESRD on HD (only for 4-6 weeks) MWF, AF not on AC, COPD, diastolic CHF, DM, recent GI bleed and PVD. He was in his usual state of health until 3/25 when family visited him at [facility] and he was "not feeling well". He received HD and was lethargic before and after. He complained of being very tired and fatigued. Then 3/26 family went to visit him again and noticed blood all over the front of his shirt, thought to be emesis. EMS was called and he was transported to [facility] ED. Upon arrival to ED he was noted to be febrile 103.3,  Tachypneic, and hypotensive. He was started on O2, IVF, and ABX. CT concerning for pneumonia. Based on hypotension and concern for CRRT need he was transferred to [facility] ICU.   Patient was admitted by critical care service, subsequently transferred to [facility] service, assumed care on 3/28     Assessment & Plan    Principal Problem:   Bacteremia due to methicillin susceptible Staphylococcus aureus (MSSA)/sepsis/septic shock -Source possibly due to tunneled dialysis catheter, vascular surgery was consulted  -[facility] has been removed -ID following, repeat blood cultures negative so far -2D echo showed EF of 70 to 65-70%, study not sufficient to rule out endocarditis -Continue Ancef, ID recommended 6 weeks of  antibiotics with hemodialysis   Active Problems: Right lower lung pneumonia versus atelectasis -Patient met sepsis criteria at the time of admission with tachypnea, hypotensive, febrile, blood cultures positive for MSSA, chest x-ray showed RLL infiltrates versus atelectasis -Continue IV Ancef  ESRD on hemodialysis - Nephrology consulted hemodialysis MWF  History of aortic stenosis S/P aortic valve replacement: 2012     Protein-calorie malnutrition, severe -Nutrition consult   Acute encephalopathy/delirium -Has significant hearing deficit, but appears close to his baseline.   -Acute encephalopathy improving, likely due to #1 and #2, possible underlying dementia  Blood on the showed,?  Hemoptysis, acute on chronic normocytic anemia -Patient has a history of epistaxis, had melanotic stools on admission 2 weeks ago and underwent normal EGD. -GI was consulted, currently no acute issues, no plans of further GI testing  Chronic atrial fibrillation -Previously on Coumadin which was discontinued during the previous admission due to GI bleed  Pancreatic body lesion seen on CT CT abdomen showed 2.2 cm rounded density in the pancreatic body -No plans  of for further workup per GI   Code Status: Full code DVT Prophylaxis:   SCD's Family Communication: No family member at the bedside   Disposition Plan: PT evaluation recommended SNF. SW called the ALF and they are not able to provide care. SW will start SNF process.   Time Spent in minutes 25 minutes  Procedures:  TDC removal HD  Consultants:   Nephrology Vascular surgery Infectious disease GI  Antimicrobials:   IV cefazolin 3/27   Medications  Scheduled Meds: . darbepoetin (ARANESP) injection - DIALYSIS  200 mcg Intravenous Q Sat-HD  . feeding supplement (NEPRO CARB STEADY)  237 mL Oral TID BM  . pantoprazole  40 mg Oral Q0600  . umeclidinium-vilanterol  1 puff Inhalation Daily   Continuous Infusions: .  ceFAZolin  (ANCEF) IV 1 g (05/23/17 1457)   PRN Meds:.acetaminophen, ipratropium-albuterol   Antibiotics   Anti-infectives (From admission, onward)   Start     Dose/Rate Route Frequency Ordered Stop   05/26/17 0000  ceFAZolin (ANCEF) IVPB     2 g Intravenous Every M-W-F (Hemodialysis) 05/24/17 1030 07/01/17 2359   05/21/17 1800  ceFAZolin (ANCEF) IVPB 1 g/50 mL premix     1 g 100 mL/hr over 30 Minutes Intravenous Every 24 hours 05/21/17 1122     05/21/17 0000  piperacillin-tazobactam (ZOSYN) IVPB 3.375 g  Status:  Discontinued     3.375 g 12.5 mL/hr over 240 Minutes Intravenous Every 12 hours 05/20/17 1909 05/21/17 1122   05/20/17 1515  vancomycin (VANCOCIN) 1,500 mg in sodium chloride 0.9 % 500 mL IVPB     1,500 mg 250 mL/hr over 120 Minutes Intravenous  Once 05/20/17 1508 05/20/17 1845   05/20/17 1500  piperacillin-tazobactam (ZOSYN) IVPB 2.25 g     2.25 g 100 mL/hr over 30 Minutes Intravenous STAT 05/20/17 1445 05/20/17 1618   05/20/17 1445  clindamycin (CLEOCIN) IVPB 600 mg  Status:  Discontinued     600 mg 100 mL/hr over 30 Minutes Intravenous Every 8 hours 05/20/17 1443 05/20/17 1916        Subjective:   Kamonte Mcmichen was seen and examined today. Wants to go home. No acute issues, fevers.     Objective:   Vitals:   05/24/17 1330 05/24/17 1400 05/24/17 1420 05/24/17 1544  BP: 125/76 (!) 153/80 (!) 150/80 (!) 148/76  Pulse: (!) 117 (!) 113 (!) 107 (!) 101  Resp:   18 18  Temp:   98 F (36.7 C) 98 F (36.7 C)  TempSrc:   Oral Oral  SpO2:   100% 100%  Weight:   64.8 kg (142 lb 13.7 oz)   Height:        Intake/Output Summary (Last 24 hours) at 05/24/2017 1657 Last data filed at 05/24/2017 1420 Gross per 24 hour  Intake 480 ml  Output 1000 ml  Net -520 ml     Wt Readings from Last 3 Encounters:  05/24/17 64.8 kg (142 lb 13.7 oz)  05/09/17 69.2 kg (152 lb 8.9 oz)  05/05/17 71.7 kg (158 lb)     Exam   General: Alert and oriented x 3, NAD  Eyes:   HEENT:     Cardiovascular: S1 S2 auscultated, ireg ireg No pedal edema b/l  Respiratory: Clear to auscultation bilaterally, no wheezing, rales or rhonchi  Gastrointestinal: Soft, nontender, nondistended, + bowel sounds  Ext: no pedal edema bilaterally  Neuro: no new deficits  Musculoskeletal: No digital cyanosis, clubbing  Skin: No rashes  Psych: Normal affect  and demeanor, alert and oriented x3     Data Reviewed:  I have personally reviewed following labs and imaging studies  Micro Results Recent Results (from the past 240 hour(s))  Culture, blood (Routine x 2)     Status: Abnormal   Collection Time: 05/20/17  1:36 PM  Result Value Ref Range Status   Specimen Description   Final    BLOOD LEFT HAND Performed at [facility] [address], [place], [place] [address]    Special Requests   Final    BOTTLES DRAWN AEROBIC AND ANAEROBIC Blood Culture adequate volume Performed at [facility] [address], [place], [place] [address]    Culture  Setup Time   Final    GRAM POSITIVE COCCI AEROBIC BOTTLE ONLY CRITICAL RESULT CALLED TO, READ BACK BY AND VERIFIED WITH: [facility] 05/21/17 0427 JDW Performed at [facility], [address], [place], [place] [address]    Culture STAPHYLOCOCCUS AUREUS (A)  Final   Report Status 05/23/2017 FINAL  Final   Organism ID, Bacteria STAPHYLOCOCCUS AUREUS  Final      Susceptibility   Staphylococcus aureus - MIC*    CIPROFLOXACIN <=0.5 SENSITIVE Sensitive     ERYTHROMYCIN <=0.25 SENSITIVE Sensitive     GENTAMICIN <=0.5 SENSITIVE Sensitive     OXACILLIN <=0.25 SENSITIVE Sensitive     TETRACYCLINE <=1 SENSITIVE Sensitive     VANCOMYCIN 1 SENSITIVE Sensitive     TRIMETH/SULFA <=10 SENSITIVE Sensitive     CLINDAMYCIN <=0.25 SENSITIVE Sensitive     RIFAMPIN <=0.5 SENSITIVE Sensitive     Inducible Clindamycin NEGATIVE Sensitive     * STAPHYLOCOCCUS AUREUS  Blood Culture ID Panel (Reflexed)      Status: Abnormal   Collection Time: 05/20/17  1:36 PM  Result Value Ref Range Status   Enterococcus species NOT DETECTED NOT DETECTED Final   Listeria monocytogenes NOT DETECTED NOT DETECTED Final   Staphylococcus species DETECTED (A) NOT DETECTED Final    Comment: CRITICAL RESULT CALLED TO, READ BACK BY AND VERIFIED WITH: L SEAY PHARMD 05/21/17 0427 JDW    Staphylococcus aureus DETECTED (A) NOT DETECTED Final    Comment: Methicillin (oxacillin) susceptible Staphylococcus aureus (MSSA). Preferred therapy is anti staphylococcal beta lactam antibiotic (Cefazolin or Nafcillin), unless clinically contraindicated. CRITICAL RESULT CALLED TO, READ BACK BY AND VERIFIED WITH: L SEAY PHARMD 05/21/17 0427 JDW    Methicillin resistance NOT DETECTED NOT DETECTED Final   Streptococcus species NOT DETECTED NOT DETECTED Final   Streptococcus agalactiae NOT DETECTED NOT DETECTED Final   Streptococcus pneumoniae NOT DETECTED NOT DETECTED Final   Streptococcus pyogenes NOT DETECTED NOT DETECTED Final   Acinetobacter baumannii NOT DETECTED NOT DETECTED Final   Enterobacteriaceae species NOT DETECTED NOT DETECTED Final   Enterobacter cloacae complex NOT DETECTED NOT DETECTED Final   Escherichia coli NOT DETECTED NOT DETECTED Final   Klebsiella oxytoca NOT DETECTED NOT DETECTED Final   Klebsiella pneumoniae NOT DETECTED NOT DETECTED Final   Proteus species NOT DETECTED NOT DETECTED Final   Serratia marcescens NOT DETECTED NOT DETECTED Final   Haemophilus influenzae NOT DETECTED NOT DETECTED Final   Neisseria meningitidis NOT DETECTED NOT DETECTED Final   Pseudomonas aeruginosa NOT DETECTED NOT DETECTED Final   Candida albicans NOT DETECTED NOT DETECTED Final   Candida glabrata NOT DETECTED NOT DETECTED Final   Candida krusei NOT DETECTED NOT DETECTED Final   Candida parapsilosis NOT DETECTED NOT DETECTED Final   Candida tropicalis NOT  DETECTED NOT DETECTED Final    Comment: Performed at [facility], [place] [address], [place], [place] [address]  Culture, blood (Routine x 2)     Status: Abnormal   Collection Time: 05/20/17  1:50 PM  Result Value Ref Range Status   Specimen Description   Final    BLOOD RIGHT ANTECUBITAL Performed at [facility] [address], [place], [place] [address]    Special Requests   Final    BOTTLES DRAWN AEROBIC AND ANAEROBIC Blood Culture adequate volume Performed at [facility] [address], [place], [place] [address]    Culture  Setup Time   Final    GRAM POSITIVE COCCI ANAEROBIC BOTTLE ONLY CRITICAL RESULT CALLED TO, READ BACK BY AND VERIFIED WITH: L SEAY PHARMD 05/21/17 0427 JDW    Culture (A)  Final    STAPHYLOCOCCUS AUREUS SUSCEPTIBILITIES PERFORMED ON PREVIOUS CULTURE WITHIN THE LAST 5 DAYS. Performed at [facility], [place] [address], [place], [place] [address]    Report Status 05/23/2017 FINAL  Final  Culture, blood (routine x 2)     Status: None (Preliminary result)   Collection Time: 05/22/17  6:49 AM  Result Value Ref Range Status   Specimen Description BLOOD BLOOD RIGHT HAND  Final   Special Requests   Final    BOTTLES DRAWN AEROBIC AND ANAEROBIC Blood Culture adequate volume   Culture   Final    NO GROWTH 2 DAYS Performed at [facility], [place] [address], [place], [place] [address]    Report Status PENDING  Incomplete  Culture, blood (routine x 2)     Status: None (Preliminary result)   Collection Time: 05/22/17  6:49 AM  Result Value Ref Range Status   Specimen Description BLOOD RIGHT ANTECUBITAL  Final   Special Requests   Final    BOTTLES DRAWN AEROBIC ONLY Blood Culture adequate volume   Culture   Final    NO GROWTH 2 DAYS Performed at [facility], [place] [address] [place], [place] [address]    Report Status PENDING  Incomplete  MRSA PCR Screening     Status: None   Collection Time: 05/22/17  2:21 PM  Result Value Ref Range Status   MRSA by PCR NEGATIVE  NEGATIVE Final    Comment:        The GeneXpert MRSA Assay (FDA approved for NASAL specimens only), is one component of a comprehensive MRSA colonization surveillance program. It is not intended to diagnose MRSA infection nor to guide or monitor treatment for MRSA infections. Performed at [facility], [place] [address] [place],  [address]     Radiology Reports Ct Abdomen Pelvis Wo Contrast  Result Date: 05/20/2017 CLINICAL DATA:  Hematemesis.  Possible sepsis. EXAM: CT CHEST, ABDOMEN AND PELVIS WITHOUT CONTRAST TECHNIQUE: Multidetector CT imaging of the chest, abdomen and pelvis was performed following the standard protocol without IV contrast. COMPARISON:  CT scan of Jun 29, 2011. FINDINGS: CT CHEST FINDINGS Cardiovascular: Atherosclerosis of thoracic aorta is noted without aneurysm formation. Left internal jugular catheter is noted with tip at cavoatrial junction. Normal cardiac size. No pericardial effusion. Status post aortic valve replacement. Mediastinum/Nodes: No enlarged mediastinal, hilar, or axillary lymph nodes. Thyroid gland, trachea, and esophagus demonstrate no significant findings. Lungs/Pleura: No pneumothorax is noted. Moderate right pleural effusion is noted with adjacent atelectasis or infiltrate in the right lower lobe. Minimal left pleural effusion is noted with adjacent  subsegmental atelectasis. Musculoskeletal: No chest wall mass or suspicious bone lesions identified. CT ABDOMEN PELVIS FINDINGS Hepatobiliary: No focal liver abnormality is seen. No gallstones, gallbladder wall thickening, or biliary dilatation. Pancreas: No ductal dilatation is noted. 2.2 cm rounded low density is noted in pancreatic body which is slightly enlarged compared to prior exam. Spleen: Normal in size without focal abnormality. Adrenals/Urinary Tract: Adrenal glands are unremarkable. Stable left renal cyst. No hydronephrosis or renal obstruction is noted. No renal or ureteral calculi are  noted. Urinary bladder is unremarkable. Stomach/Bowel: The stomach appears normal. There is no evidence of bowel obstruction or inflammation. Vascular/Lymphatic: Aortic atherosclerosis. No enlarged abdominal or pelvic lymph nodes. Reproductive: Prostate is unremarkable. Other: No abdominal wall hernia or abnormality. No abdominopelvic ascites. Musculoskeletal: No acute or significant osseous findings. IMPRESSION: 2.2 cm rounded density is noted in pancreatic body which is slightly enlarged compared to prior exam of 2013. Recommend follow up pre and post contrast MRI/MRCP or pancreatic protocol CT in 2 years. This recommendation follows ACR consensus guidelines: Management of Incidental Pancreatic Cysts: A White Paper of the ACR Incidental Findings Committee. [place] 3007;62:263-335. Moderate right pleural effusion is noted with adjacent atelectasis or infiltrate in the right lower lobe. Minimal left pleural effusion is noted with adjacent subsegmental atelectasis. Stable left renal cyst. Aortic Atherosclerosis (ICD10-I70.0). Electronically Signed   By: Marijo Conception, M.D.   On: 05/20/2017 17:27   Ct Chest Wo Contrast  Result Date: 05/20/2017 CLINICAL DATA:  Hematemesis.  Possible sepsis. EXAM: CT CHEST, ABDOMEN AND PELVIS WITHOUT CONTRAST TECHNIQUE: Multidetector CT imaging of the chest, abdomen and pelvis was performed following the standard protocol without IV contrast. COMPARISON:  CT scan of Jun 29, 2011. FINDINGS: CT CHEST FINDINGS Cardiovascular: Atherosclerosis of thoracic aorta is noted without aneurysm formation. Left internal jugular catheter is noted with tip at cavoatrial junction. Normal cardiac size. No pericardial effusion. Status post aortic valve replacement. Mediastinum/Nodes: No enlarged mediastinal, hilar, or axillary lymph nodes. Thyroid gland, trachea, and esophagus demonstrate no significant findings. Lungs/Pleura: No pneumothorax is noted. Moderate right pleural effusion is  noted with adjacent atelectasis or infiltrate in the right lower lobe. Minimal left pleural effusion is noted with adjacent subsegmental atelectasis. Musculoskeletal: No chest wall mass or suspicious bone lesions identified. CT ABDOMEN PELVIS FINDINGS Hepatobiliary: No focal liver abnormality is seen. No gallstones, gallbladder wall thickening, or biliary dilatation. Pancreas: No ductal dilatation is noted. 2.2 cm rounded low density is noted in pancreatic body which is slightly enlarged compared to prior exam. Spleen: Normal in size without focal abnormality. Adrenals/Urinary Tract: Adrenal glands are unremarkable. Stable left renal cyst. No hydronephrosis or renal obstruction is noted. No renal or ureteral calculi are noted. Urinary bladder is unremarkable. Stomach/Bowel: The stomach appears normal. There is no evidence of bowel obstruction or inflammation. Vascular/Lymphatic: Aortic atherosclerosis. No enlarged abdominal or pelvic lymph nodes. Reproductive: Prostate is unremarkable. Other: No abdominal wall hernia or abnormality. No abdominopelvic ascites. Musculoskeletal: No acute or significant osseous findings. IMPRESSION: 2.2 cm rounded density is noted in pancreatic body which is slightly enlarged compared to prior exam of 2013. Recommend follow up pre and post contrast MRI/MRCP or pancreatic protocol CT in 2 years. This recommendation follows ACR consensus guidelines: Management of Incidental Pancreatic Cysts: A White Paper of the ACR Incidental Findings Committee. [place] [id number];56:389-373. Moderate right pleural effusion is noted with adjacent atelectasis or infiltrate in the right lower lobe. Minimal left pleural effusion is noted with adjacent subsegmental  atelectasis. Stable left renal cyst. Aortic Atherosclerosis (ICD10-I70.0). Electronically Signed   By: Marijo Conception, M.D.   On: 05/20/2017 17:27   Dg Chest Portable 1 View  Result Date: 05/20/2017 CLINICAL DATA:  Suspicion of sepsis.  EXAM: PORTABLE CHEST 1 VIEW COMPARISON:  Portable chest x-ray of April 19, 2017 FINDINGS: The lungs are well-expanded. The interstitial markings remain increased bilaterally greatest on the right. The cardiac silhouette remains mildly enlarged. The pulmonary vascularity is engorged. The dual-lumen dialysis catheter tip projects at the cavoatrial junction. The sternal wires and left atrial appendage clip are stable. IMPRESSION: CHF with mild interstitial edema. One cannot exclude basilar pneumonia on the right. Electronically Signed   By: David  Martinique M.D.   On: 05/20/2017 15:05    Lab Data:  CBC: Recent Labs  Lab 05/20/17 1352 05/20/17 1359 05/21/17 0403 05/21/17 1419 05/23/17 1240  WBC 18.9*  --  13.9* 20.1* 10.4  NEUTROABS 17.6*  --   --   --   --   HGB 9.6* 10.5* 8.4* 9.6* 8.1*  HCT 30.8* 31.0* 27.0* 30.8* 25.8*  MCV 99.0  --  96.8 96.3 94.5  PLT 100*  --  70* 74* 54*   Basic Metabolic Panel: Recent Labs  Lab 05/20/17 1352 05/20/17 1359 05/21/17 0403 05/23/17 1240  NA 137 138 136 134*  K 4.0 4.0 3.9 4.2  CL 99* 97* 99* 99*  CO2 25  --  23 25  GLUCOSE 62* 60* 144* 189*  BUN 28* 31* 37* 49*  CREATININE 3.94* 4.00* 4.42* 4.72*  CALCIUM 8.0*  --  7.5* 8.6*  MG  --   --  1.9  --   PHOS  --   --  5.7* 3.0   GFR: Estimated Creatinine Clearance: 9 mL/min (A) (by C-G formula based on SCr of 4.72 mg/dL (H)). Liver Function Tests: Recent Labs  Lab 05/20/17 1352 05/23/17 1240  AST 43*  --   ALT 18  --   ALKPHOS 79  --   BILITOT 1.2  --   PROT 6.5  --   ALBUMIN 2.5* 2.0*   No results for input(s): LIPASE, AMYLASE in the last 168 hours. No results for input(s): AMMONIA in the last 168 hours. Coagulation Profile: Recent Labs  Lab 05/20/17 1352 05/21/17 0403  INR 1.37 1.47   Cardiac Enzymes: No results for input(s): CKTOTAL, CKMB, CKMBINDEX, TROPONINI in the last 168 hours. BNP (last 3 results) No results for input(s): PROBNP in the last 8760 hours. HbA1C: No  results for input(s): HGBA1C in the last 72 hours. CBG: Recent Labs  Lab 05/23/17 0749 05/23/17 1207 05/23/17 1708 05/23/17 2111 05/24/17 0755  GLUCAP 145* 168* 118* 277* 128*   Lipid Profile: No results for input(s): CHOL, HDL, LDLCALC, TRIG, CHOLHDL, LDLDIRECT in the last 72 hours. Thyroid Function Tests: No results for input(s): TSH, T4TOTAL, FREET4, T3FREE, THYROIDAB in the last 72 hours. Anemia Panel: No results for input(s): VITAMINB12, FOLATE, FERRITIN, TIBC, IRON, RETICCTPCT in the last 72 hours. Urine analysis:    Component Value Date/Time   COLORURINE AMBER (A) 05/05/2017 1959   APPEARANCEUR TURBID (A) 05/05/2017 1959   LABSPEC  05/05/2017 1959    TEST NOT REPORTED DUE TO COLOR INTERFERENCE OF URINE PIGMENT   PHURINE  05/05/2017 1959    TEST NOT REPORTED DUE TO COLOR INTERFERENCE OF URINE PIGMENT   GLUCOSEU (A) 05/05/2017 1959    TEST NOT REPORTED DUE TO COLOR INTERFERENCE OF URINE PIGMENT   HGBUR (A) 05/05/2017  1959    TEST NOT REPORTED DUE TO COLOR INTERFERENCE OF URINE PIGMENT   BILIRUBINUR (A) 05/05/2017 1959    TEST NOT REPORTED DUE TO COLOR INTERFERENCE OF URINE PIGMENT   KETONESUR (A) 05/05/2017 1959    TEST NOT REPORTED DUE TO COLOR INTERFERENCE OF URINE PIGMENT   PROTEINUR (A) 05/05/2017 1959    TEST NOT REPORTED DUE TO COLOR INTERFERENCE OF URINE PIGMENT   UROBILINOGEN 1.0 12/13/2013 1556   NITRITE (A) 05/05/2017 1959    TEST NOT REPORTED DUE TO COLOR INTERFERENCE OF URINE PIGMENT   LEUKOCYTESUR (A) 05/05/2017 1959    TEST NOT REPORTED DUE TO COLOR INTERFERENCE OF URINE PIGMENT     Jaidence Geisler M.D. [facility] 05/24/2017, 4:57 PM  Pager: [phone] Between 7am to 7pm - call Pager - [phone]  After 7pm go to [email or web address] - password [email or web address]  Call night coverage person covering after 7pm

## 2017-05-24 NOTE — Progress Notes (Signed)
Patient is complaining of left neck and shoulder pain.  He rates as 5/10.  He would like Tylenol for the aching.  [facility] called and made aware.  Order for Tylenol received and given at 2138 with good relief.  Will continue to monitor patient.  Earleen Reaper RN-BC, [employer]

## 2017-05-24 NOTE — Clinical Social Work Note (Addendum)
CSW called [facility] has left for the day. Admissions says they thought pt would go to SNF. Admissions to call RN cell phone. CSW is awaiting a call back from the RN to see if they can accomodate pt. CSW to call family to determine plan.  3:55 CSW spoke with [place] at [facility] and she states they can not take pt back. She states pt needs higher level of care that they do not provide. CSW spoke with pts son and family is going to talk to the pt about going to SNF. Family provided with SNF. SNF workup initiated.  [place], [place]

## 2017-05-24 NOTE — Progress Notes (Signed)
[facility] Progress Note   Subjective: excited to be going home today. No C/Os.     Objective Vitals:   05/23/17 2028 05/24/17 0328 05/24/17 0456 05/24/17 0933  BP: 126/61  (!) 144/67   Pulse: (!) 121  (!) 106   Resp: 20  20   Temp: 99.2 F (37.3 C)  98.3 F (36.8 C)   TempSrc: Oral  Oral   SpO2: 97%  100% 100%  Weight: 65.8 kg (145 lb) 65.8 kg (145 lb 1 oz)    Height:  5\' 11"  (1.803 m)     Physical Exam General: frail, pleasant, NAD Heart: regularly irregular no M/R/G Lungs: CTAB Abdomen: S,NT Extremities: no LE edema Dialysis Access: LUA AVF + bruit  Additional Objective Labs: Basic Metabolic Panel: Recent Labs  Lab 05/20/17 1352 05/20/17 1359 05/21/17 0403 05/23/17 1240  NA 137 138 136 134*  K 4.0 4.0 3.9 4.2  CL 99* 97* 99* 99*  CO2 25  --  23 25  GLUCOSE 62* 60* 144* 189*  BUN 28* 31* 37* 49*  CREATININE 3.94* 4.00* 4.42* 4.72*  CALCIUM 8.0*  --  7.5* 8.6*  PHOS  --   --  5.7* 3.0   Liver Function Tests: Recent Labs  Lab 05/20/17 1352 05/23/17 1240  AST 43*  --   ALT 18  --   ALKPHOS 79  --   BILITOT 1.2  --   PROT 6.5  --   ALBUMIN 2.5* 2.0*   No results for input(s): LIPASE, AMYLASE in the last 168 hours. CBC: Recent Labs  Lab 05/20/17 1352  05/21/17 0403 05/21/17 1419 05/23/17 1240  WBC 18.9*  --  13.9* 20.1* 10.4  NEUTROABS 17.6*  --   --   --   --   HGB 9.6*   < > 8.4* 9.6* 8.1*  HCT 30.8*   < > 27.0* 30.8* 25.8*  MCV 99.0  --  96.8 96.3 94.5  PLT 100*  --  70* 74* 54*   < > = values in this interval not displayed.   Blood Culture    Component Value Date/Time   SDES BLOOD BLOOD RIGHT HAND 05/22/2017 0649   SDES BLOOD RIGHT ANTECUBITAL 05/22/2017 0649   SPECREQUEST  05/22/2017 0649    BOTTLES DRAWN AEROBIC AND ANAEROBIC Blood Culture adequate volume   SPECREQUEST  05/22/2017 0649    BOTTLES DRAWN AEROBIC ONLY Blood Culture adequate volume   CULT  05/22/2017 0649    NO GROWTH 1 DAY Performed at [facility], [place] [address] [place], [place] [address]    CULT  05/22/2017 [phone]    NO GROWTH 1 DAY Performed at [facility] [address] [place], [place] [address]    REPTSTATUS PENDING 05/22/2017 7619   REPTSTATUS PENDING 05/22/2017 5093    Cardiac Enzymes: No results for input(s): CKTOTAL, CKMB, CKMBINDEX, TROPONINI in the last 168 hours. CBG: Recent Labs  Lab 05/23/17 0749 05/23/17 1207 05/23/17 1708 05/23/17 2111 05/24/17 0755  GLUCAP 145* 168* 118* 277* 128*   Iron Studies: No results for input(s): IRON, TIBC, TRANSFERRIN, FERRITIN in the last 72 hours. @lablastinr3 @ Studies/Results: No results found. Medications: .  ceFAZolin (ANCEF) IV 1 g (05/23/17 1457)   . [START ON 05/28/2017] darbepoetin (ARANESP) injection - DIALYSIS  200 mcg Intravenous Q Wed-HD  . feeding supplement (NEPRO CARB STEADY)  237 mL Oral TID BM  . pantoprazole  40 mg Oral Q0600  . umeclidinium-vilanterol  1 puff  Inhalation Daily     [facility].4 hours  HD Bath 2K 2.5 Ca DialyzerF180 BFR 400 Heparinnone. AccessTDC and maturing AVG Mircera 225 q 2 weeks, to start 4.1, Mircera 100 mcg given 3/18. No VDRA or binders No heparin  1Septic shock 2/2 MSSA Bacteremia: likely from [facility] which has been removed, greatly appreciate VVS. No evidence of endocarditis by TTE. ID following, no plans for TEE. Continue cefazolin for 6 weeks. Repeat BC NG 1 day.  2.?GI bleed- story with blood all over shirtupon ED arrival but recent EGD was WNL. NO plans for further testing per GI. Etiology possible epistaxis.  3ESRD: MWF- Next HD 05/26/17 at OP HD center.  4Hypertension: BP appears well controlled.  hold BP meds 5. Anemia of ESRD:on Max ESA at OP center, dose Aranesp 200 mcg q week(given 3/27) HGB 9.6 today. Follow HGB.  6. Metabolic Bone Disease:Phos 5.7 Ca 7.5 C Ca 8.7. Start calcium acetate 667 mg PO TID AC.  7. Afib: Off AC 8. Nutrition: Albumin 2.5.  Poor prognostic sign. Add prostat, renal vits. On renal diet w/fluid restrictions.  9.  Dispo: DC home today. Continue Ancef for 6 weeks per ID recommendations. Patient wishes to continue hemodialysis.    Kensy Blizard H. Hart Haas NP-C 05/24/2017, 9:52 AM  [employer] [phone]

## 2017-05-24 NOTE — Discharge Summary (Signed)
Physician Discharge Summary   [name] MRN: [id number] DOB/AGE: 03/17/1922 84 y.o.  Admit date: 05/20/2017 Discharge date: 05/26/2017  Primary Care Physician:  Russo, John, MD   Recommendations for Outpatient Follow-up:  1. Follow up with PCP in 1-2 weeks 2. Please obtain BMP/CBC in one week  Home Health: None  Equipment/Devices: Discharge Condition: stable  CODE STATUS: FULL  Diet recommendation: Carb modified diet   Discharge Diagnoses:    . Sepsis (HCC) . Bacteremia due to methicillin susceptible Staphylococcus aureus (MSSA) Right lower lung pneumonia versus atelectasis ESRD on hemodialysis History of aortic stenosis status post AVR 2012 Severe protein calorie malnutrition Acute encephalopathy/delirium Acute on chronic normocytic anemia Chronic atrial fibrillation Pancreatic body lesion seen on CT Stage I pressure injury sacrum   Consults:  Nephrology Vascular surgery Infectious disease Dr. Campbell GI    Allergies:   Allergies  Allergen Reactions  . Aspirin Anaphylaxis, Shortness Of Breath, Rash and Other (See Comments)    "broke out in white welts; red blotches neck and face; windpipe closed; ~ 1962"  . Tape Other (See Comments)    Patient's skin is VERY THIN and tears and bruises easily!!     DISCHARGE MEDICATIONS: Allergies as of 05/26/2017      Reactions   Aspirin Anaphylaxis, Shortness Of Breath, Rash, Other (See Comments)   "broke out in white welts; red blotches neck and face; windpipe closed; ~ 1962"   Tape Other (See Comments)   Patient's skin is VERY THIN and tears and bruises easily!!      Medication List    TAKE these medications   acetaminophen 325 MG tablet Commonly known as:  TYLENOL Take 650 mg by mouth every 6 (six) hours as needed ("for pain score 1-4; not to exceed #8 tablets in 24 hours").   albuterol 0.63 MG/3ML nebulizer solution Commonly known as:  ACCUNEB Take 3 mLs by nebulization every 6 (six) hours as  needed for wheezing or shortness of breath.   ANORO ELLIPTA 62.5-25 MCG/INH Aepb Generic drug:  umeclidinium-vilanterol Inhale 1 puff into the lungs daily.   buPROPion 150 MG 24 hr tablet Commonly known as:  WELLBUTRIN XL Take 150 mg by mouth daily. Notes to patient:  This is for hemodialysis center    ceFAZolin IVPB Commonly known as:  ANCEF Inject 2 g into the vein every Monday, Wednesday, and Friday with hemodialysis. Indication:  MSSA bactermia Last Day of Therapy:  07/01/2017 Labs - Once weekly:  CBC/D and BMP, Labs - Every other week:  ESR and CRP Notes to patient:  This is for hemodialysis center to administer   diphenhydrAMINE 12.5 MG/5ML elixir Commonly known as:  BENADRYL Take 5 mLs (12.5 mg total) by mouth every 6 (six) hours as needed for itching. Over the counter What changed:  additional instructions   doxazosin 2 MG tablet Commonly known as:  CARDURA Take 1 tablet (2 mg total) by mouth at bedtime.   ferrous sulfate 325 (65 FE) MG EC tablet Take 1 tablet (325 mg total) by mouth 2 (two) times daily. What changed:  when to take this   finasteride 5 MG tablet Commonly known as:  PROSCAR Take 5 mg by mouth at bedtime.   fluticasone 50 MCG/ACT nasal spray Commonly known as:  FLONASE Place 1 spray into both nostrils 2 (two) times daily.   glipiZIDE 5 MG tablet Commonly known as:  GLUCOTROL Take 5 mg by mouth daily before breakfast.   loratadine 10 MG tablet Commonly   known as:  CLARITIN Take 10 mg by mouth daily.   Melatonin 5 MG Tabs Take 5 mg by mouth at bedtime.   metoprolol succinate 25 MG 24 hr tablet Commonly known as:  TOPROL-XL Take 0.5 tablets (12.5 mg total) by mouth 2 (two) times daily with a meal.   multivitamin with minerals tablet Take 1 tablet by mouth at bedtime.   neomycin-bacitracin-polymyxin ointment Commonly known as:  NEOSPORIN Apply 1 application topically daily. Apply to left elbow and forearm daily for 10 days. Started  05/14/17.   OXYGEN Inhale 2 L into the lungs continuous.   pantoprazole 40 MG tablet Commonly known as:  PROTONIX Take 1 tablet (40 mg total) by mouth daily.   simvastatin 20 MG tablet Commonly known as:  ZOCOR Take 20 mg by mouth every evening.   traMADol 50 MG tablet Commonly known as:  ULTRAM Take 1 tablet (50 mg total) by mouth every 12 (twelve) hours as needed for moderate pain.            Home Infusion Instuctions  (From admission, onward)        Start     Ordered   05/24/17 0000  Home infusion instructions [facility] May follow [place] Dosing Protocol; May administer Cathflo as needed to maintain patency of vascular access device.; Flushing of vascular access device: per [facility] Protocol: 0.9% NaCl pre/post medica...    Question Answer Comment  Instructions May follow [place] Dosing Protocol   Instructions May administer Cathflo as needed to maintain patency of vascular access device.   Instructions Flushing of vascular access device: per [facility] Protocol: 0.9% NaCl pre/post medication administration and prn patency; Heparin 100 u/ml, 41m for implanted ports and Heparin 10u/ml, 555mfor all other central venous catheters.   Instructions May follow [facility] Anaphylaxis Protocol for First Dose Administration in the home: 0.9% NaCl at 25-50 ml/hr to maintain IV access for protocol meds. Epinephrine 0.3 ml IV/IM PRN and Benadryl 25-50 IV/IM PRN s/s of anaphylaxis.   Instructions [facility] Infusion Coordinator (RN) to assist per patient IV care needs in the home PRN.      05/24/17 1030       Brief H and P: For complete details please refer to admission H and P, but in brief Per admission note by P CCM, Dr. RaChase Callern [id number] old male with PMH as below, which is significant for smoker since [age]rs old, ESRD on HD (only for 4-6 weeks) MWF, AF not on AC, COPD, diastolic CHF, DM, recent GI bleed and PVD. He was in his usual state of health until [facility]25 [facility] He received HD and was lethargic before and after. He complained of being very tired and fatigued. Then 3/26 family went to visit him again and noticed blood all over the front of his shirt, thought to be emesis. EMS [facility] Upon [facility] to ED he was noted to be febrile 103.3, Tachypneic, and hypotensive. He was started on O2, IVF, and ABX. CT concerning for pneumonia. Based on hypotension and concern for [facility] need [facility]   Patient was admitted by critical care service, subsequently transferred to triad hospitalist service, assumed care on 3/28  Hospital Course:    Bacteremia due to methicillin susceptible Staphylococcus aureus (MSSA)/sepsis/septic shock -Source possibly due to tunneled dialysis catheter, vascular surgery was  consulted  -TDC has been removed -Infectious disease consult was obtained.  Repeat blood cultures has remained negative.  Dr. Campbell spoke to the patient about TEE however patient preferred to avoid the procedure. -2D echo showed EF of 70 to 65-70%, study not sufficient to rule out endocarditis -Per ID recommendations, continue 6 weeks of cefazolin dosed after hemodialysis to treat for possible endocarditis since he does have a tissue prosthetic aortic valve. -Follow-up outpatient with ID -Continue cefazolin 2 g IV q. Monday Wednesday Friday with HD, end date 07/01/2017  Right lower lung pneumonia versus atelectasis -Patient met sepsis criteria at the time of admission with tachypnea, hypotensive, febrile, blood cultures positive for MSSA, chest x-ray showed RLL infiltrates versus atelectasis -Continue IV Ancef  ESRD on hemodialysis - Nephrology consulted hemodialysis MWF  History of aortic stenosis S/P aortic valve replacement: 2012     Protein-calorie malnutrition, severe -Recommended nutritional supplements -PT evaluation  recommended skilled nursing facility  Acute encephalopathy/delirium -Has significant hearing deficit, but appears close to his baseline.   -Acute encephalopathy improving, likely due to #1 and #2, possible underlying dementia  Blood on the shirt (?) possible hemoptysis, acute on chronic normocytic anemia -Patient has a history of epistaxis, had melanotic stools on admission 2 weeks ago and underwent normal EGD. -GI was consulted, currently no acute issues, no plans of further GI testing.  Chronic atrial fibrillation -Previously on Coumadin which was discontinued during the previous admission due to GI bleed  Pancreatic body lesion seen on CT CT abdomen showed 2.2 cm rounded density in the pancreatic body -No plans of for further workup per GI   Stage 1 pressure injury on sacrum: - nursing care, frequent turning    Day of Discharge S: Denies any specific complaints, seen during HD, hoping to be discharged soon  BP (!) 138/56 (BP Location: Right Arm)   Pulse (!) 108   Temp 98.3 F (36.8 C) (Oral)   Resp 16   Ht 5' 11" (1.803 m)   Wt 63.5 kg (140 lb)   SpO2 99%   BMI 19.53 kg/m   Physical Exam: General: Alert and awake oriented x3 hard of hearing HEENT: anicteric sclera, pupils reactive to light and accommodation CVS: S1-S2 clear, irregularly irregular Chest: clear to auscultation bilaterally, no wheezing rales or rhonchi Abdomen: soft nontender, nondistended, normal bowel sounds Extremities: no cyanosis, clubbing or edema noted bilaterally Neuro: no new deficits   The results of significant diagnostics from this hospitalization (including imaging, microbiology, ancillary and laboratory) are listed below for reference.      Procedures/Studies:  Ct Abdomen Pelvis Wo Contrast  Result Date: 05/20/2017 CLINICAL DATA:  Hematemesis.  Possible sepsis. EXAM: CT CHEST, ABDOMEN AND PELVIS WITHOUT CONTRAST TECHNIQUE: Multidetector CT imaging of the chest, abdomen and pelvis  was performed following the standard protocol without IV contrast. COMPARISON:  CT scan of Jun 29, 2011. FINDINGS: CT CHEST FINDINGS Cardiovascular: Atherosclerosis of thoracic aorta is noted without aneurysm formation. Left internal jugular catheter is noted with tip at cavoatrial junction. Normal cardiac size. No pericardial effusion. Status post aortic valve replacement. Mediastinum/Nodes: No enlarged mediastinal, hilar, or axillary lymph nodes. Thyroid gland, trachea, and esophagus demonstrate no significant findings. Lungs/Pleura: No pneumothorax is noted. Moderate right pleural effusion is noted with adjacent atelectasis or infiltrate in the right lower lobe. Minimal left pleural effusion is noted with adjacent subsegmental atelectasis. Musculoskeletal: No chest wall mass or suspicious bone lesions identified. CT ABDOMEN PELVIS FINDINGS Hepatobiliary: No focal liver abnormality is seen.   No gallstones, gallbladder wall thickening, or biliary dilatation. Pancreas: No ductal dilatation is noted. 2.2 cm rounded low density is noted in pancreatic body which is slightly enlarged compared to prior exam. Spleen: Normal in size without focal abnormality. Adrenals/Urinary Tract: Adrenal glands are unremarkable. Stable left renal cyst. No hydronephrosis or renal obstruction is noted. No renal or ureteral calculi are noted. Urinary bladder is unremarkable. Stomach/Bowel: The stomach appears normal. There is no evidence of bowel obstruction or inflammation. Vascular/Lymphatic: Aortic atherosclerosis. No enlarged abdominal or pelvic lymph nodes. Reproductive: Prostate is unremarkable. Other: No abdominal wall hernia or abnormality. No abdominopelvic ascites. Musculoskeletal: No acute or significant osseous findings. IMPRESSION: 2.2 cm rounded density is noted in pancreatic body which is slightly enlarged compared to prior exam of 2013. Recommend follow up pre and post contrast MRI/MRCP or pancreatic protocol CT in 2 years.  This recommendation follows ACR consensus guidelines: Management of Incidental Pancreatic Cysts: A White Paper of the ACR Incidental Findings Committee. [place] 1740;81:448-185. Moderate right pleural effusion is noted with adjacent atelectasis or infiltrate in the right lower lobe. Minimal left pleural effusion is noted with adjacent subsegmental atelectasis. Stable left renal cyst. Aortic Atherosclerosis (ICD10-I70.0). Electronically Signed   By: Marijo Conception, M.D.   On: 05/20/2017 17:27   Ct Chest Wo Contrast  Result Date: 05/20/2017 CLINICAL DATA:  Hematemesis.  Possible sepsis. EXAM: CT CHEST, ABDOMEN AND PELVIS WITHOUT CONTRAST TECHNIQUE: Multidetector CT imaging of the chest, abdomen and pelvis was performed following the standard protocol without IV contrast. COMPARISON:  CT scan of Jun 29, 2011. FINDINGS: CT CHEST FINDINGS Cardiovascular: Atherosclerosis of thoracic aorta is noted without aneurysm formation. Left internal jugular catheter is noted with tip at cavoatrial junction. Normal cardiac size. No pericardial effusion. Status post aortic valve replacement. Mediastinum/Nodes: No enlarged mediastinal, hilar, or axillary lymph nodes. Thyroid gland, trachea, and esophagus demonstrate no significant findings. Lungs/Pleura: No pneumothorax is noted. Moderate right pleural effusion is noted with adjacent atelectasis or infiltrate in the right lower lobe. Minimal left pleural effusion is noted with adjacent subsegmental atelectasis. Musculoskeletal: No chest wall mass or suspicious bone lesions identified. CT ABDOMEN PELVIS FINDINGS Hepatobiliary: No focal liver abnormality is seen. No gallstones, gallbladder wall thickening, or biliary dilatation. Pancreas: No ductal dilatation is noted. 2.2 cm rounded low density is noted in pancreatic body which is slightly enlarged compared to prior exam. Spleen: Normal in size without focal abnormality. Adrenals/Urinary Tract: Adrenal glands are  unremarkable. Stable left renal cyst. No hydronephrosis or renal obstruction is noted. No renal or ureteral calculi are noted. Urinary bladder is unremarkable. Stomach/Bowel: The stomach appears normal. There is no evidence of bowel obstruction or inflammation. Vascular/Lymphatic: Aortic atherosclerosis. No enlarged abdominal or pelvic lymph nodes. Reproductive: Prostate is unremarkable. Other: No abdominal wall hernia or abnormality. No abdominopelvic ascites. Musculoskeletal: No acute or significant osseous findings. IMPRESSION: 2.2 cm rounded density is noted in pancreatic body which is slightly enlarged compared to prior exam of 2013. Recommend follow up pre and post contrast MRI/MRCP or pancreatic protocol CT in 2 years. This recommendation follows ACR consensus guidelines: Management of Incidental Pancreatic Cysts: A White Paper of the ACR Incidental Findings Committee. [place] 6314;97:[id number]. Moderate right pleural effusion is noted with adjacent atelectasis or infiltrate in the right lower lobe. Minimal left pleural effusion is noted with adjacent subsegmental atelectasis. Stable left renal cyst. Aortic Atherosclerosis (ICD10-I70.0). Electronically Signed   By: Marijo Conception, M.D.   On: 05/20/2017 17:27  Dg Chest Portable 1 View  Result Date: 05/20/2017 CLINICAL DATA:  Suspicion of sepsis. EXAM: PORTABLE CHEST 1 VIEW COMPARISON:  Portable chest x-ray of April 19, 2017 FINDINGS: The lungs are well-expanded. The interstitial markings remain increased bilaterally greatest on the right. The cardiac silhouette remains mildly enlarged. The pulmonary vascularity is engorged. The dual-lumen dialysis catheter tip projects at the cavoatrial junction. The sternal wires and left atrial appendage clip are stable. IMPRESSION: CHF with mild interstitial edema. One cannot exclude basilar pneumonia on the right. Electronically Signed   By: David  Martinique M.D.   On: 05/20/2017 15:05      LAB  RESULTS: Basic Metabolic Panel: Recent Labs  Lab 05/21/17 0403 05/23/17 1240 05/26/17 0930  NA 136 134* 133*  K 3.9 4.2 4.6  CL 99* 99* 97*  CO2 _0 GLUCOSE 144* 189* 234*  BUN 37* 49* 36*  CREATININE 4.42* 4.72* 4.15*  CALCIUM 7.5* 8.6* 8.4*  MG 1.9  --   --   PHOS 5.7* 3.0 2.7   Liver Function Tests: Recent Labs  Lab 05/20/17 1352 05/23/17 1240 05/26/17 0930  AST 43*  --   --   ALT 18  --   --   ALKPHOS 79  --   --   BILITOT 1.2  --   --   PROT 6.5  --   --   ALBUMIN 2.5* 2.0* 1.9*   No results for input(s): LIPASE, AMYLASE in the last 168 hours. No results for input(s): AMMONIA in the last 168 hours. CBC: Recent Labs  Lab 05/20/17 1352  05/23/17 1240 05/26/17 0949  WBC 18.9*   < > 10.4 8.0  NEUTROABS 17.6*  --   --   --   HGB 9.6*   < > 8.1* 7.3*  HCT 30.8*   < > 25.8* 23.1*  MCV 99.0   < > 94.5 93.1  PLT 100*   < > 54* 105*   < > = values in this interval not displayed.   Cardiac Enzymes: No results for input(s): CKTOTAL, CKMB, CKMBINDEX, TROPONINI in the last 168 hours. BNP: Invalid input(s): POCBNP CBG: Recent Labs  Lab 05/24/17 0755 05/24/17 1646  GLUCAP 128* 143*      Disposition and Follow-up: Discharge Instructions    Home infusion instructions [facility] May follow [place] Dosing Protocol; May administer Cathflo as needed to maintain patency of vascular access device.; Flushing of vascular access device: per [facility] Protocol: 0.9% NaCl pre/post medica...   Complete by:  As directed    Instructions:  May follow [place] Dosing Protocol   Instructions:  May administer Cathflo as needed to maintain patency of vascular access device.   Instructions:  Flushing of vascular access device: per [facility] Protocol: 0.9% NaCl pre/post medication administration and prn patency; Heparin 100 u/ml, 26m for implanted ports and Heparin 10u/ml, 547mfor all other central venous catheters.   Instructions:  May follow [facility] Anaphylaxis Protocol for  First Dose Administration in the home: 0.9% NaCl at 25-50 ml/hr to maintain IV access for protocol meds. Epinephrine 0.3 ml IV/IM PRN and Benadryl 25-50 IV/IM PRN s/s of anaphylaxis.   Instructions:  [facility] Coordinator (RN) to assist per patient IV care needs in the home PRN.   Increase activity slowly   Complete by:  As directed        DISPOSITION: SNF   DISCHARGE FOLLOW-UP Follow-up Information    RuShon BatonMD. Schedule an appointment as  soon as possible for a visit in 2 week(s).   Specialty:  Internal Medicine Contact information: [address] [place] [address] [phone]        Campbell, John, MD Follow up in 4 week(s).   Specialty:  Infectious Diseases Contact information: [address] [place] [address] [phone]            Time coordinating discharge:  35 mins   Signed:     M.D. [facility] 05/26/2017, 12:56 PM Pager: [phone]       

## 2017-05-25 NOTE — Progress Notes (Signed)
[facility]                                                                              Patient Demographics  Ronald [name], is a 84 y.o. male, DOB - January 06, 1923, FOY:[id number]  Admit date - 05/20/2017   Admitting Physician Jesus Genera, MD  Outpatient Primary MD for the patient is [name], MD  Outpatient specialists:   LOS - 5  days   Medical records reviewed and are as summarized below:    Chief Complaint  Patient presents with  . Emesis  . Blood Infection       Brief summary   Per admission note by P CCM, Dr. Chase Caller on [age] year old male with PMH as below, which is significant for smoker since 84 yrs old, ESRD on HD (only for 4-6 weeks) MWF, AF not on AC, COPD, diastolic CHF, DM, recent GI bleed and PVD. He was in his usual state of health until 3/25 when family visited him at [facility] and he was "not feeling well". He received HD and was lethargic before and after. He complained of being very tired and fatigued. Then 3/26 family went to visit him again and noticed blood all over the front of his shirt, thought to be emesis. EMS was called and he was transported to [facility] ED. Upon arrival to ED he was noted to be febrile 103.3,  Tachypneic, and hypotensive. He was started on O2, IVF, and ABX. CT concerning for pneumonia. Based on hypotension and concern for CRRT need he was transferred to [facility] ICU.   Patient was admitted by critical care service, subsequently transferred to [facility], assumed care on 3/28     Assessment & Plan    Principal Problem:   Bacteremia due to methicillin susceptible Staphylococcus aureus (MSSA)/sepsis/septic shock -Source possibly due to tunneled dialysis catheter, vascular surgery was consulted  -[facility] has been removed -ID following, repeat blood cultures negative so far -2D echo showed EF of 70 to 65-70%, study not sufficient to rule out endocarditis.  Per ID, continue 6 weeks of cefazolin to  treat for possible endocarditis since he does have a tissue prosthetic aortic valve. -Continue Ancef, ID recommended 6 weeks of antibiotics with hemodialysis, MWF, end date 07/01/2017.  Active Problems: Right lower lung pneumonia versus atelectasis -Patient met sepsis criteria at the time of admission with tachypnea, hypotensive, febrile, blood cultures positive for MSSA, chest x-ray showed RLL infiltrates versus atelectasis -Continue IV Ancef  ESRD on hemodialysis - Nephrology consulted hemodialysis MWF  History of aortic stenosis S/P aortic valve replacement: 2012     Protein-calorie malnutrition, severe -Continue nutritional supplements PT OT evaluation recommended skilled nursing facility  Acute encephalopathy/delirium -Has significant hearing deficit, but appears close to his baseline.   -Acute encephalopathy improving, likely due to #1 and #2, possible underlying dementia  Blood on the shirt (?)  Hemoptysis, acute on chronic normocytic anemia -Patient has a history of epistaxis, had melanotic stools on admission 2 weeks ago and underwent normal EGD. -GI was consulted, currently no acute issues, no plans of further GI testing  Chronic atrial fibrillation -Previously  on Coumadin which was discontinued during the previous admission due to GI bleed  Pancreatic body lesion seen on CT CT abdomen showed 2.2 cm rounded density in the pancreatic body -No plans of for further workup per GI   Stage I pressure injury on sacrum Continue nursing care, frequent turning-   Code Status: Full code DVT Prophylaxis:   SCD's Family Communication: No family member at the bedside   Disposition Plan: PT evaluation recommended SNF. SW called the ALF and they are not able to provide care. SW consulted for skilled nursing facility placement.  Time Spent in minutes 25 minutes  Procedures:  TDC removal HD  Consultants:   Nephrology Vascular surgery Infectious disease GI  Antimicrobials:     IV cefazolin 3/27   Medications  Scheduled Meds: . darbepoetin (ARANESP) injection - DIALYSIS  200 mcg Intravenous Q Sat-HD  . feeding supplement (NEPRO CARB STEADY)  237 mL Oral TID BM  . pantoprazole  40 mg Oral Q0600  . umeclidinium-vilanterol  1 puff Inhalation Daily   Continuous Infusions: .  ceFAZolin (ANCEF) IV 1 g (05/24/17 1814)   PRN Meds:.acetaminophen, ipratropium-albuterol   Antibiotics   Anti-infectives (From admission, onward)   Start     Dose/Rate Route Frequency Ordered Stop   05/26/17 0000  ceFAZolin (ANCEF) IVPB     2 g Intravenous Every M-W-F (Hemodialysis) 05/24/17 1030 07/01/17 2359   05/21/17 1800  ceFAZolin (ANCEF) IVPB 1 g/50 mL premix     1 g 100 mL/hr over 30 Minutes Intravenous Every 24 hours 05/21/17 1122     05/21/17 0000  piperacillin-tazobactam (ZOSYN) IVPB 3.375 g  Status:  Discontinued     3.375 g 12.5 mL/hr over 240 Minutes Intravenous Every 12 hours 05/20/17 1909 05/21/17 1122   05/20/17 1515  vancomycin (VANCOCIN) 1,500 mg in sodium chloride 0.9 % 500 mL IVPB     1,500 mg 250 mL/hr over 120 Minutes Intravenous  Once 05/20/17 1508 05/20/17 1845   05/20/17 1500  piperacillin-tazobactam (ZOSYN) IVPB 2.25 g     2.25 g 100 mL/hr over 30 Minutes Intravenous STAT 05/20/17 1445 05/20/17 1618   05/20/17 1445  clindamycin (CLEOCIN) IVPB 600 mg  Status:  Discontinued     600 mg 100 mL/hr over 30 Minutes Intravenous Every 8 hours 05/20/17 1443 05/20/17 1916        Subjective:   Ronald [name] was seen and examined today.  Upset about not being able to go "home".  No acute issues overnight.  No fevers or chills.     Objective:   Vitals:   05/24/17 1420 05/24/17 1544 05/24/17 2035 05/25/17 0432  BP: (!) 150/80 (!) 148/76 121/72 135/74  Pulse: (!) 107 (!) 101 (!) 116 (!) 111  Resp: _0 Temp: 98 F (36.7 C) 98 F (36.7 C) 99.6 F (37.6 C) 98 F (36.7 C)  TempSrc: Oral Oral    SpO2: 100% 100% 96% 98%  Weight: 64.8 kg (142 lb  13.7 oz)  64 kg (141 lb 1.5 oz)   Height:        Intake/Output Summary (Last 24 hours) at 05/25/2017 1201 Last data filed at 05/25/2017 0600 Gross per 24 hour  Intake 0 ml  Output 1000 ml  Net -1000 ml     Wt Readings from Last 3 Encounters:  05/24/17 64 kg (141 lb 1.5 oz)  05/09/17 69.2 kg (152 lb 8.9 oz)  05/05/17 71.7 kg (158 lb)     Exam  General: Alert and oriented x 3, NAD, hard of hearing  Eyes:  HEENT:    Cardiovascular: S1 S2 clear, RRR No pedal edema b/l  Respiratory: Clear to auscultation bilaterally, no wheezing, rales or rhonchi  Gastrointestinal: Soft, nontender, nondistended, + bowel sounds  Ext: no pedal edema bilaterally  Neuro: no new deficits  Musculoskeletal: No digital cyanosis, clubbing  Skin: No rashes  Psych: Normal affect and demeanor, alert and oriented x3    Data Reviewed:  I have personally reviewed following labs and imaging studies  Micro Results Recent Results (from the past 240 hour(s))  Culture, blood (Routine x 2)     Status: Abnormal   Collection Time: 05/20/17  1:36 PM  Result Value Ref Range Status   Specimen Description   Final    BLOOD LEFT HAND Performed at [place] [address] [place], [place] [address]    Special Requests   Final    BOTTLES DRAWN AEROBIC AND ANAEROBIC Blood Culture adequate volume Performed at [place] [address] [place], [place] [address]    Culture  Setup Time   Final    GRAM POSITIVE COCCI AEROBIC BOTTLE ONLY CRITICAL RESULT CALLED TO, READ BACK BY AND VERIFIED WITH: Shellee [facility] 05/21/17 0427 JDW Performed at [facility], [address], [place], [place] [address]    Culture STAPHYLOCOCCUS AUREUS (A)  Final   Report Status 05/23/2017 FINAL  Final   Organism ID, Bacteria STAPHYLOCOCCUS AUREUS  Final      Susceptibility   Staphylococcus aureus - MIC*    CIPROFLOXACIN <=0.5 SENSITIVE Sensitive     ERYTHROMYCIN <=0.25  SENSITIVE Sensitive     GENTAMICIN <=0.5 SENSITIVE Sensitive     OXACILLIN <=0.25 SENSITIVE Sensitive     TETRACYCLINE <=1 SENSITIVE Sensitive     VANCOMYCIN 1 SENSITIVE Sensitive     TRIMETH/SULFA <=10 SENSITIVE Sensitive     CLINDAMYCIN <=0.25 SENSITIVE Sensitive     RIFAMPIN <=0.5 SENSITIVE Sensitive     Inducible Clindamycin NEGATIVE Sensitive     * STAPHYLOCOCCUS AUREUS  Blood Culture ID Panel (Reflexed)     Status: Abnormal   Collection Time: 05/20/17  1:36 PM  Result Value Ref Range Status   Enterococcus species NOT DETECTED NOT DETECTED Final   Listeria monocytogenes NOT DETECTED NOT DETECTED Final   Staphylococcus species DETECTED (A) NOT DETECTED Final    Comment: CRITICAL RESULT CALLED TO, READ BACK BY AND VERIFIED WITH: L SEAY PHARMD 05/21/17 0427 JDW    Staphylococcus aureus DETECTED (A) NOT DETECTED Final    Comment: Methicillin (oxacillin) susceptible Staphylococcus aureus (MSSA). Preferred therapy is anti staphylococcal beta lactam antibiotic (Cefazolin or Nafcillin), unless clinically contraindicated. CRITICAL RESULT CALLED TO, READ BACK BY AND VERIFIED WITH: L SEAY PHARMD 05/21/17 0427 JDW    Methicillin resistance NOT DETECTED NOT DETECTED Final   Streptococcus species NOT DETECTED NOT DETECTED Final   Streptococcus agalactiae NOT DETECTED NOT DETECTED Final   Streptococcus pneumoniae NOT DETECTED NOT DETECTED Final   Streptococcus pyogenes NOT DETECTED NOT DETECTED Final   Acinetobacter baumannii NOT DETECTED NOT DETECTED Final   Enterobacteriaceae species NOT DETECTED NOT DETECTED Final   Enterobacter cloacae complex NOT DETECTED NOT DETECTED Final   Escherichia coli NOT DETECTED NOT DETECTED Final   Klebsiella oxytoca NOT DETECTED NOT DETECTED Final   Klebsiella pneumoniae NOT DETECTED NOT DETECTED Final   Proteus species NOT DETECTED NOT DETECTED Final   Serratia marcescens NOT DETECTED NOT DETECTED  Final   Haemophilus influenzae NOT DETECTED NOT DETECTED  Final   Neisseria meningitidis NOT DETECTED NOT DETECTED Final   Pseudomonas aeruginosa NOT DETECTED NOT DETECTED Final   Candida albicans NOT DETECTED NOT DETECTED Final   Candida glabrata NOT DETECTED NOT DETECTED Final   Candida krusei NOT DETECTED NOT DETECTED Final   Candida parapsilosis NOT DETECTED NOT DETECTED Final   Candida tropicalis NOT DETECTED NOT DETECTED Final    Comment: Performed at [facility], [place] [address], [place], [place] [address]  Culture, blood (Routine x 2)     Status: Abnormal   Collection Time: 05/20/17  1:50 PM  Result Value Ref Range Status   Specimen Description   Final    BLOOD RIGHT ANTECUBITAL Performed at [place] [address], [place], [place] [address]    Special Requests   Final    BOTTLES DRAWN AEROBIC AND ANAEROBIC Blood Culture adequate volume Performed at [place] [address], [place], [place] [address]    Culture  Setup Time   Final    GRAM POSITIVE COCCI ANAEROBIC BOTTLE ONLY CRITICAL RESULT CALLED TO, READ BACK BY AND VERIFIED WITH: L SEAY PHARMD 05/21/17 0427 JDW    Culture (A)  Final    STAPHYLOCOCCUS AUREUS SUSCEPTIBILITIES PERFORMED ON PREVIOUS CULTURE WITHIN THE LAST 5 DAYS. Performed at [facility], [place] [address] [place], [place] [address]    Report Status 05/23/2017 FINAL  Final  Culture, blood (routine x 2)     Status: None (Preliminary result)   Collection Time: 05/22/17  6:49 AM  Result Value Ref Range Status   Specimen Description BLOOD BLOOD RIGHT HAND  Final   Special Requests   Final    BOTTLES DRAWN AEROBIC AND ANAEROBIC Blood Culture adequate volume   Culture   Final    NO GROWTH 3 DAYS Performed at [facility], [place] [address] [place], [place] [address]    Report Status PENDING  Incomplete  Culture, blood (routine x 2)     Status: None (Preliminary result)   Collection Time: 05/22/17  6:49 AM  Result Value Ref Range Status   Specimen  Description BLOOD RIGHT ANTECUBITAL  Final   Special Requests   Final    BOTTLES DRAWN AEROBIC ONLY Blood Culture adequate volume   Culture   Final    NO GROWTH 3 DAYS Performed at [facility], [place] [address], [place], [place] [address]    Report Status PENDING  Incomplete  MRSA PCR Screening     Status: None   Collection Time: 05/22/17  2:21 PM  Result Value Ref Range Status   MRSA by PCR NEGATIVE NEGATIVE Final    Comment:        The GeneXpert MRSA Assay (FDA approved for NASAL specimens only), is one component of a comprehensive MRSA colonization surveillance program. It is not intended to diagnose MRSA infection nor to guide or monitor treatment for MRSA infections. Performed at [facility], [place] [address] [place], [place] [address]     Radiology Reports Ct Abdomen Pelvis Wo Contrast  Result Date: 05/20/2017 CLINICAL DATA:  Hematemesis.  Possible sepsis. EXAM: CT CHEST, ABDOMEN AND PELVIS WITHOUT CONTRAST TECHNIQUE: Multidetector CT imaging of the chest, abdomen and pelvis was performed following the standard protocol without IV contrast. COMPARISON:  CT scan of Jun 29, 2011. FINDINGS: CT CHEST FINDINGS Cardiovascular: Atherosclerosis of thoracic aorta is noted without aneurysm formation.  Left internal jugular catheter is noted with tip at cavoatrial junction. Normal cardiac size. No pericardial effusion. Status post aortic valve replacement. Mediastinum/Nodes: No enlarged mediastinal, hilar, or axillary lymph nodes. Thyroid gland, trachea, and esophagus demonstrate no significant findings. Lungs/Pleura: No pneumothorax is noted. Moderate right pleural effusion is noted with adjacent atelectasis or infiltrate in the right lower lobe. Minimal left pleural effusion is noted with adjacent subsegmental atelectasis. Musculoskeletal: No chest wall mass or suspicious bone lesions identified. CT ABDOMEN PELVIS FINDINGS Hepatobiliary: No focal liver abnormality is seen. No  gallstones, gallbladder wall thickening, or biliary dilatation. Pancreas: No ductal dilatation is noted. 2.2 cm rounded low density is noted in pancreatic body which is slightly enlarged compared to prior exam. Spleen: Normal in size without focal abnormality. Adrenals/Urinary Tract: Adrenal glands are unremarkable. Stable left renal cyst. No hydronephrosis or renal obstruction is noted. No renal or ureteral calculi are noted. Urinary bladder is unremarkable. Stomach/Bowel: The stomach appears normal. There is no evidence of bowel obstruction or inflammation. Vascular/Lymphatic: Aortic atherosclerosis. No enlarged abdominal or pelvic lymph nodes. Reproductive: Prostate is unremarkable. Other: No abdominal wall hernia or abnormality. No abdominopelvic ascites. Musculoskeletal: No acute or significant osseous findings. IMPRESSION: 2.2 cm rounded density is noted in pancreatic body which is slightly enlarged compared to prior exam of 2013. Recommend follow up pre and post contrast MRI/MRCP or pancreatic protocol CT in 2 years. This recommendation follows ACR consensus guidelines: Management of Incidental Pancreatic Cysts: A White Paper of the ACR Incidental Findings Committee. [place] 1601;09:[id number]. Moderate right pleural effusion is noted with adjacent atelectasis or infiltrate in the right lower lobe. Minimal left pleural effusion is noted with adjacent subsegmental atelectasis. Stable left renal cyst. Aortic Atherosclerosis (ICD10-I70.0). Electronically Signed   By: Marijo Conception, M.D.   On: 05/20/2017 17:27   Ct Chest Wo Contrast  Result Date: 05/20/2017 CLINICAL DATA:  Hematemesis.  Possible sepsis. EXAM: CT CHEST, ABDOMEN AND PELVIS WITHOUT CONTRAST TECHNIQUE: Multidetector CT imaging of the chest, abdomen and pelvis was performed following the standard protocol without IV contrast. COMPARISON:  CT scan of Jun 29, 2011. FINDINGS: CT CHEST FINDINGS Cardiovascular: Atherosclerosis of thoracic aorta  is noted without aneurysm formation. Left internal jugular catheter is noted with tip at cavoatrial junction. Normal cardiac size. No pericardial effusion. Status post aortic valve replacement. Mediastinum/Nodes: No enlarged mediastinal, hilar, or axillary lymph nodes. Thyroid gland, trachea, and esophagus demonstrate no significant findings. Lungs/Pleura: No pneumothorax is noted. Moderate right pleural effusion is noted with adjacent atelectasis or infiltrate in the right lower lobe. Minimal left pleural effusion is noted with adjacent subsegmental atelectasis. Musculoskeletal: No chest wall mass or suspicious bone lesions identified. CT ABDOMEN PELVIS FINDINGS Hepatobiliary: No focal liver abnormality is seen. No gallstones, gallbladder wall thickening, or biliary dilatation. Pancreas: No ductal dilatation is noted. 2.2 cm rounded low density is noted in pancreatic body which is slightly enlarged compared to prior exam. Spleen: Normal in size without focal abnormality. Adrenals/Urinary Tract: Adrenal glands are unremarkable. Stable left renal cyst. No hydronephrosis or renal obstruction is noted. No renal or ureteral calculi are noted. Urinary bladder is unremarkable. Stomach/Bowel: The stomach appears normal. There is no evidence of bowel obstruction or inflammation. Vascular/Lymphatic: Aortic atherosclerosis. No enlarged abdominal or pelvic lymph nodes. Reproductive: Prostate is unremarkable. Other: No abdominal wall hernia or abnormality. No abdominopelvic ascites. Musculoskeletal: No acute or significant osseous findings. IMPRESSION: 2.2 cm rounded density is noted in pancreatic body which is slightly enlarged compared to  prior exam of 2013. Recommend follow up pre and post contrast MRI/MRCP or pancreatic protocol CT in 2 years. This recommendation follows ACR consensus guidelines: Management of Incidental Pancreatic Cysts: A White Paper of the ACR Incidental Findings Committee. [place]  1572;[id number]. Moderate right pleural effusion is noted with adjacent atelectasis or infiltrate in the right lower lobe. Minimal left pleural effusion is noted with adjacent subsegmental atelectasis. Stable left renal cyst. Aortic Atherosclerosis (ICD10-I70.0). Electronically Signed   By: Marijo Conception, M.D.   On: 05/20/2017 17:27   Dg Chest Portable 1 View  Result Date: 05/20/2017 CLINICAL DATA:  Suspicion of sepsis. EXAM: PORTABLE CHEST 1 VIEW COMPARISON:  Portable chest x-ray of April 19, 2017 FINDINGS: The lungs are well-expanded. The interstitial markings remain increased bilaterally greatest on the right. The cardiac silhouette remains mildly enlarged. The pulmonary vascularity is engorged. The dual-lumen dialysis catheter tip projects at the cavoatrial junction. The sternal wires and left atrial appendage clip are stable. IMPRESSION: CHF with mild interstitial edema. One cannot exclude basilar pneumonia on the right. Electronically Signed   By: David  Martinique M.D.   On: 05/20/2017 15:05    Lab Data:  CBC: Recent Labs  Lab 05/20/17 1352 05/20/17 1359 05/21/17 0403 05/21/17 1419 05/23/17 1240  WBC 18.9*  --  13.9* 20.1* 10.4  NEUTROABS 17.6*  --   --   --   --   HGB 9.6* 10.5* 8.4* 9.6* 8.1*  HCT 30.8* 31.0* 27.0* 30.8* 25.8*  MCV 99.0  --  96.8 96.3 94.5  PLT 100*  --  70* 74* 54*   Basic Metabolic Panel: Recent Labs  Lab 05/20/17 1352 05/20/17 1359 05/21/17 0403 05/23/17 1240  NA 137 138 136 134*  K 4.0 4.0 3.9 4.2  CL 99* 97* 99* 99*  CO2 25  --  23 25  GLUCOSE 62* 60* 144* 189*  BUN 28* 31* 37* 49*  CREATININE 3.94* 4.00* 4.42* 4.72*  CALCIUM 8.0*  --  7.5* 8.6*  MG  --   --  1.9  --   PHOS  --   --  5.7* 3.0   GFR: Estimated Creatinine Clearance: 8.9 mL/min (A) (by C-G formula based on SCr of 4.72 mg/dL (H)). Liver Function Tests: Recent Labs  Lab 05/20/17 1352 05/23/17 1240  AST 43*  --   ALT 18  --   ALKPHOS 79  --   BILITOT 1.2  --   PROT 6.5   --   ALBUMIN 2.5* 2.0*   No results for input(s): LIPASE, AMYLASE in the last 168 hours. No results for input(s): AMMONIA in the last 168 hours. Coagulation Profile: Recent Labs  Lab 05/20/17 1352 05/21/17 0403  INR 1.37 1.47   Cardiac Enzymes: No results for input(s): CKTOTAL, CKMB, CKMBINDEX, TROPONINI in the last 168 hours. BNP (last 3 results) No results for input(s): PROBNP in the last 8760 hours. HbA1C: No results for input(s): HGBA1C in the last 72 hours. CBG: Recent Labs  Lab 05/23/17 1207 05/23/17 1708 05/23/17 2111 05/24/17 0755 05/24/17 1646  GLUCAP 168* 118* 277* 128* 143*   Lipid Profile: No results for input(s): CHOL, HDL, LDLCALC, TRIG, CHOLHDL, LDLDIRECT in the last 72 hours. Thyroid Function Tests: No results for input(s): TSH, T4TOTAL, FREET4, T3FREE, THYROIDAB in the last 72 hours. Anemia Panel: No results for input(s): VITAMINB12, FOLATE, FERRITIN, TIBC, IRON, RETICCTPCT in the last 72 hours. Urine analysis:    Component Value Date/Time   COLORURINE AMBER (A) 05/05/2017 1959  APPEARANCEUR TURBID (A) 05/05/2017 1959   LABSPEC  05/05/2017 1959    TEST NOT REPORTED DUE TO COLOR INTERFERENCE OF URINE PIGMENT   PHURINE  05/05/2017 1959    TEST NOT REPORTED DUE TO COLOR INTERFERENCE OF URINE PIGMENT   GLUCOSEU (A) 05/05/2017 1959    TEST NOT REPORTED DUE TO COLOR INTERFERENCE OF URINE PIGMENT   HGBUR (A) 05/05/2017 1959    TEST NOT REPORTED DUE TO COLOR INTERFERENCE OF URINE PIGMENT   BILIRUBINUR (A) 05/05/2017 1959    TEST NOT REPORTED DUE TO COLOR INTERFERENCE OF URINE PIGMENT   KETONESUR (A) 05/05/2017 1959    TEST NOT REPORTED DUE TO COLOR INTERFERENCE OF URINE PIGMENT   PROTEINUR (A) 05/05/2017 1959    TEST NOT REPORTED DUE TO COLOR INTERFERENCE OF URINE PIGMENT   UROBILINOGEN 1.0 12/13/2013 1556   NITRITE (A) 05/05/2017 1959    TEST NOT REPORTED DUE TO COLOR INTERFERENCE OF URINE PIGMENT   LEUKOCYTESUR (A) 05/05/2017 1959    TEST NOT  REPORTED DUE TO COLOR INTERFERENCE OF URINE PIGMENT     Shellia Hartl M.D. [facility] 05/25/2017, 12:01 PM  Pager: [phone] Between 7am to 7pm - call Pager - [phone]  After 7pm go to [email or web address] - password [email or web address]  Call night coverage person covering after 7pm

## 2017-05-25 NOTE — Care Management Note (Signed)
Case Management Note Previous CM note completed by Ronald Cardinal, RN  Nurse case manager [place] 05/22/2017, 12:09 PM   Patient Details  Name: Ronald [name] MRN: [id number] Date of Birth: 21-Jun-1922  Subjective/Objective:     Admitted for Septic shock 2/2 MSSA Bacteremia:      Action/Plan: Vascular consulted:TDC  has been removed on Ancef.  Will need TTE and repeat blood cultures  PTA, pt resided at [facility]; [name] following once patient is medically stable and will be returning to the same living situation after discharge.  LCSW "Ronald [name]" following may need ST-rehab.  Expected Discharge Date:  05/24/17               Expected Discharge Plan:  Assisted Living / Rest Home  In-House Referral:  Clinical Social Work, Hospice / [place], [facility]  Discharge planning Services  CM Consult  Status of Service:  Completed, signed off   Additional Comments:  05/25/17- 1025- Ronald Kleinpeter RN, CM- noted [name], pt from [employer]- however per CSW note- [facility] has declined pt's return as they feel pt needs hight lever of care- CSW if working on SNF placement at this time as pt is medically stable for discharge. No further CM needs at this time- plan for SNF when bed available.   Ronald Patricia, RN  Nurse case manager [place] 05/25/2017, 10:28 AM Weekend coverage for 69M [phone]

## 2017-05-25 NOTE — Progress Notes (Signed)
 [facility] Progress Note   Subjective: Sleeping, easily aroused, No C/Os. Was supposed to be dc'd home yesterday but ALF will not accept him, says he must to go SNF.SW assisting with placement to SNF.    Objective Vitals:   05/24/17 1420 05/24/17 1544 05/24/17 2035 05/25/17 0432  BP: (!) 150/80 (!) 148/76 121/72 135/74  Pulse: (!) 107 (!) 101 (!) 116 (!) 111  Resp: 18 18 15 17   Temp: 98 F (36.7 C) 98 F (36.7 C) 99.6 F (37.6 C) 98 F (36.7 C)  TempSrc: Oral Oral    SpO2: 100% 100% 96% 98%  Weight: 64.8 kg (142 lb 13.7 oz)  64 kg (141 lb 1.5 oz)   Height:       Physical Exam General: frail, pleasant, NAD Heart: regularly irregular no M/R/G Lungs: CTAB Abdomen: S,NT Extremities: no LE edema Dialysis Access: LUA AVF + bruit    Dialysis Orders:  Additional Objective Labs: Basic Metabolic Panel: Recent Labs  Lab 05/20/17 1352 05/20/17 1359 05/21/17 0403 05/23/17 1240  NA 137 138 136 134*  K 4.0 4.0 3.9 4.2  CL 99* 97* 99* 99*  CO2 25  --  23 25  GLUCOSE 62* 60* 144* 189*  BUN 28* 31* 37* 49*  CREATININE 3.94* 4.00* 4.42* 4.72*  CALCIUM 8.0*  --  7.5* 8.6*  PHOS  --   --  5.7* 3.0   Liver Function Tests: Recent Labs  Lab 05/20/17 1352 05/23/17 1240  AST 43*  --   ALT 18  --   ALKPHOS 79  --   BILITOT 1.2  --   PROT 6.5  --   ALBUMIN 2.5* 2.0*   No results for input(s): LIPASE, AMYLASE in the last 168 hours. CBC: Recent Labs  Lab 05/20/17 1352  05/21/17 0403 05/21/17 1419 05/23/17 1240  WBC 18.9*  --  13.9* 20.1* 10.4  NEUTROABS 17.6*  --   --   --   --   HGB 9.6*   < > 8.4* 9.6* 8.1*  HCT 30.8*   < > 27.0* 30.8* 25.8*  MCV 99.0  --  96.8 96.3 94.5  PLT 100*  --  70* 74* 54*   < > = values in this interval not displayed.   Blood Culture    Component Value Date/Time   SDES BLOOD BLOOD RIGHT HAND 05/22/2017 0649   SDES BLOOD RIGHT ANTECUBITAL 05/22/2017 0649   SPECREQUEST  05/22/2017 0649    BOTTLES DRAWN AEROBIC AND  ANAEROBIC Blood Culture adequate volume   SPECREQUEST  05/22/2017 0649    BOTTLES DRAWN AEROBIC ONLY Blood Culture adequate volume   CULT  05/22/2017 0649    NO GROWTH 3 DAYS Performed at [facility], [place] [address], [place], [place] [address]    CULT  05/22/2017 [phone]    NO GROWTH 3 DAYS Performed at [facility], [place] [address], [place],  [address]    REPTSTATUS PENDING 05/22/2017 4650   REPTSTATUS PENDING 05/22/2017 3546    Cardiac Enzymes: No results for input(s): CKTOTAL, CKMB, CKMBINDEX, TROPONINI in the last 168 hours. CBG: Recent Labs  Lab 05/23/17 1207 05/23/17 1708 05/23/17 2111 05/24/17 0755 05/24/17 1646  GLUCAP 168* 118* 277* 128* 143*   Iron Studies: No results for input(s): IRON, TIBC, TRANSFERRIN, FERRITIN in the last 72 hours. @lablastinr3 @ Studies/Results: No results found. Medications: .  ceFAZolin (ANCEF) IV 1 g (05/24/17 1814)   . darbepoetin (ARANESP) injection - DIALYSIS  200 mcg  Intravenous Q Sat-HD  . feeding supplement (NEPRO CARB STEADY)  237 mL Oral TID BM  . pantoprazole  40 mg Oral Q0600  . umeclidinium-vilanterol  1 puff Inhalation Daily     [facility].4 [facility]  HD Bath 2K 2.5 Ca DialyzerF180 BFR 400 Heparinnone. AccessTDC and maturing AVG Mircera 225 q 2 weeks, to start 4.1, Mircera 100 mcg given 3/18. No VDRA or binders No heparin  1Septic shock 2/2 MSSA Bacteremia: likely from [facility] which has been removed, greatly appreciate VVS.No evidence of endocarditis by TTE. ID following, no plans for TEE. Continue cefazolin for 6 weeks. Repeat 03/28 BC NG 3 days.  2.?GI bleed- story with blood all over shirtupon ED arrival but recent EGD was WNL. NO plans for further testing per GI. Etiology possible epistaxis.  3ESRD: MWF- Next HD 05/26/17 here-will do first shift.   4Hypertension/volume:BP appears well controlled.No antihypertensive meds. Will need lower EDW on DC.  5.  Anemia of ESRD:on Max ESA at OP center, dose Aranesp 200 mcg q week(given 3/27)HGB 9.6 today. Follow HGB. 6. Metabolic Bone Disease:Phos 5.7 Ca 7.5 C Ca 8.7. Start calcium acetate 667 mg PO TID AC. 7. Afib: Not on anticoagulation.  8.Nutrition: Albumin 2.5. Add prostat, renal vits. On renal diet w/fluid restrictions.  9.Dispo: DC to SNF when facility available. Continue Ancef for 6 weeks April 1st-Jul 04, 2017 per ID recommendations. Patient wishes to continue hemodialysis.     H.  NP-C 05/25/2017, 11:27 AM  [employer] [phone]

## 2017-05-25 NOTE — Evaluation (Signed)
Occupational Therapy Evaluation Patient Details Name: Ronald [name] MRN: [id number] DOB: 10-Nov-1922 Today's Date: 05/25/2017    History of Present Illness Pt is a 84 y/o male admitted secondary to feeling unwell and found to be septic. PMH including but not limited to a-fib, CHF, COPD, DM, HTN, PVD, ESRD, AVR in 2012.   Clinical Impression   PTA, pt was living at [place] and required assistance for ADLs including dressing, bathing, and functional transfers. Pt using RW for stand pivot transfer to w/c. Currently pt requiring Min-Mod A for UB ADLs, Max A for LB ADLs, and Min A for stand pivot transfer with RW. Pt demonstrating decreased activity tolerance and required several rest breaks. Pt requiring increased time for processing and reports he feels slower with thinking stating "I can't seem to finish one thought." Pt would benefit from further acute OT to facilitate safe dc. Recommend dc to SNF for further OT to optimize safety, independence with ADLs, and return to PLOF.      Follow Up Recommendations  SNF;Supervision/Assistance - 24 hour    Equipment Recommendations  None recommended by OT    Recommendations for Other Services PT consult     Precautions / Restrictions Precautions Precautions: Fall Precaution Comments: very HOH; speak into right ear Restrictions Weight Bearing Restrictions: No      Mobility Bed Mobility Overal bed mobility: Needs Assistance Bed Mobility: Supine to Sit     Supine to sit: Min assist     General bed mobility comments: min A for trunk elevation, increased time and effort. several rest breaks  Transfers Overall transfer level: Needs assistance Equipment used: Rolling walker (2 wheeled) Transfers: Sit to/from [facility] Sit to Stand: Min assist;From elevated surface Stand pivot transfers: Min assist       General transfer comment: increased time and effort, good technique, assist to power into standing from  sitting EOB and assist for stability with pivotal movements to chair    Balance Overall balance assessment: Needs assistance Sitting-balance support: Feet supported;No upper extremity supported Sitting balance-Leahy Scale: Fair Sitting balance - Comments: pt able to sit EOB to brush teeth without UE supports with min guard for safety   Standing balance support: Bilateral upper extremity supported Standing balance-Leahy Scale: Poor Standing balance comment: requires UE support                           ADL either performed or assessed with clinical judgement   ADL Overall ADL's : Needs assistance/impaired Eating/Feeding: Sitting;Minimal assistance Eating/Feeding Details (indicate cue type and reason): Pt set up and eating lunch with Min A for preparation lunch. Pt not using LUE for bilateral tasks and requiring assistance to open containers.  Grooming: Set up;Sitting   Upper Body Bathing: Minimal assistance;Sitting   Lower Body Bathing: Moderate assistance;Sit to/from stand   Upper Body Dressing : Minimal assistance;Sitting Upper Body Dressing Details (indicate cue type and reason): Poor ROM of LUE Lower Body Dressing: Maximal assistance;Sit to/from stand Lower Body Dressing Details (indicate cue type and reason): Max A to don socks.  Toilet Transfer: Minimal assistance;RW;Stand-pivot(Simulated to recliner)           Functional mobility during ADLs: Minimal assistance;Rolling walker(stand pivot only) General ADL Comments: Pt demonstrating decreased functional performance and activity tolerance. Pt requiring several rest breaks during bed mobility and stand pivot transfer to recliner in preparation for ADLs in sitting. Pt stating "I just need to rest now." Pt  with heavy breathing. SpO2 95% on roomair throughout session     Vision Baseline Vision/History: Wears glasses Wears Glasses: Reading only("small print only") Patient Visual Report: No change from baseline        Perception     Praxis      Pertinent Vitals/Pain Pain Assessment: No/denies pain     Hand Dominance Right   Extremity/Trunk Assessment Upper Extremity Assessment Upper Extremity Assessment: LUE deficits/detail LUE Deficits / Details: Pt reporting left shoulder injury. Limited shoulder ROM and uses compensatory hiking to perform shoulder flexion. Pt with limited use and performing ADLs with right hand only LUE: Unable to fully assess due to pain LUE Coordination: decreased gross motor   Lower Extremity Assessment Lower Extremity Assessment: Defer to PT evaluation RLE Deficits / Details: observed decreased R ankle DF during ambulation   Cervical / Trunk Assessment Cervical / Trunk Assessment: Kyphotic   Communication Communication Communication: HOH;Other (comment)(speak into R ear, very slowly)   Cognition Arousal/Alertness: Awake/alert Behavior During Therapy: WFL for tasks assessed/performed Overall Cognitive Status: Impaired/Different from baseline Area of Impairment: Problem solving                             Problem Solving: Slow processing General Comments: Pt stating "I can't seem to complete one thought." Requiring increased time to process questions and performance of ADLs.    General Comments       Exercises     Shoulder Instructions      Home Living Family/patient expects to be discharged to:: Assisted living Living Arrangements: Alone                           Home Equipment: Walker - 2 wheels;Wheelchair - manual;Hospital bed   Additional Comments: Pt from [facility] ALF.      Prior Functioning/Environment Level of Independence: Needs assistance  Gait / Transfers Assistance Needed: pt stated that he performs transfers into and out of his w/c by himself and can propel himself some in the w/c ADL's / Homemaking Assistance Needed: Pt reporting that he requires assistance with dressing, bathing, and functional transfers    Comments: HD MWF        OT Problem List: Decreased strength;Decreased range of motion;Decreased activity tolerance;Impaired balance (sitting and/or standing);Decreased knowledge of use of DME or AE;Decreased safety awareness;Decreased knowledge of precautions;Pain      OT Treatment/Interventions: Self-care/ADL training;Therapeutic exercise;Energy conservation;DME and/or AE instruction;Therapeutic activities;Patient/family education    OT Goals(Current goals can be found in the care plan section) Acute Rehab OT Goals Patient Stated Goal: return home OT Goal Formulation: With patient/family Time For Goal Achievement: 05/21/17 Potential to Achieve Goals: Good ADL Goals Pt Will Perform Grooming: with set-up;with supervision;sitting Pt Will Perform Upper Body Dressing: with set-up;with supervision;sitting Pt Will Perform Lower Body Dressing: with mod assist;sit to/from stand Pt Will Transfer to Toilet: with set-up;with supervision;ambulating;bedside commode Pt Will Perform Toileting - Clothing Manipulation and hygiene: with set-up;with supervision;sit to/from stand  OT Frequency: Min 2X/week   Barriers to D/C:            Co-evaluation              AM-PAC PT "6 Clicks" Daily Activity     Outcome Measure Help from another person eating meals?: A Little Help from another person taking care of personal grooming?: A Little Help from another person toileting, which includes using toliet, bedpan, or urinal?:  A Little Help from another person bathing (including washing, rinsing, drying)?: A Lot Help from another person to put on and taking off regular upper body clothing?: A Little Help from another person to put on and taking off regular lower body clothing?: A Lot 6 Click Score: 16   End of Session Equipment Utilized During Treatment: Gait belt;Rolling walker Nurse Communication: Mobility status  Activity Tolerance: Patient limited by fatigue Patient left: in chair;with call  bell/phone within reach;with chair alarm set  OT Visit Diagnosis: Unsteadiness on feet (R26.81);Other abnormalities of gait and mobility (R26.89);Muscle weakness (generalized) (M62.81)                Time: 0964-3838 OT Time Calculation (min): 29 min Charges:  OT General Charges $OT Visit: 1 Visit OT Evaluation $OT Eval Moderate Complexity: 1 Mod OT Treatments $Self Care/Home Management : 8-22 mins G-Codes:     Keilyn Nadal MSOT, OTR/L Acute Rehab Pager: [phone] Office: [place] 05/25/2017, 1:15 PM

## 2017-05-25 NOTE — Clinical Social Work Note (Signed)
CSW reached out to [facility] at ALF and it is confirmed they will not take pt back. Pt's daughter updated. Pt's daughter suggested 5 facilities and non of those facilities are able to take pt. Pt's daughter is now agreeable to [place]. [name] does have beds available and would be able to transport pt to his dialysis MWF. CSW spoke with AD to determine insurance part. CSW was instructed to contact hospital med director to determine if pt is "rehabable" and if insurance will pick it up--CSW left voicemail, awaiting call back.   [place], [place]

## 2017-05-26 DIAGNOSIS — Z794 Long term (current) use of insulin: Secondary | ICD-10-CM | POA: Diagnosis not present

## 2017-05-26 DIAGNOSIS — T82858A Stenosis of vascular prosthetic devices, implants and grafts, initial encounter: Secondary | ICD-10-CM | POA: Diagnosis not present

## 2017-05-26 DIAGNOSIS — R7881 Bacteremia: Secondary | ICD-10-CM | POA: Diagnosis not present

## 2017-05-26 DIAGNOSIS — Z992 Dependence on renal dialysis: Secondary | ICD-10-CM | POA: Diagnosis not present

## 2017-05-26 DIAGNOSIS — M84359S Stress fracture, hip, unspecified, sequela: Secondary | ICD-10-CM | POA: Diagnosis not present

## 2017-05-26 DIAGNOSIS — Z952 Presence of prosthetic heart valve: Secondary | ICD-10-CM | POA: Diagnosis not present

## 2017-05-26 DIAGNOSIS — E43 Unspecified severe protein-calorie malnutrition: Secondary | ICD-10-CM | POA: Diagnosis not present

## 2017-05-26 DIAGNOSIS — A419 Sepsis, unspecified organism: Secondary | ICD-10-CM | POA: Diagnosis not present

## 2017-05-26 DIAGNOSIS — R0602 Shortness of breath: Secondary | ICD-10-CM | POA: Diagnosis not present

## 2017-05-26 DIAGNOSIS — D649 Anemia, unspecified: Secondary | ICD-10-CM | POA: Diagnosis not present

## 2017-05-26 DIAGNOSIS — R042 Hemoptysis: Secondary | ICD-10-CM | POA: Diagnosis not present

## 2017-05-26 DIAGNOSIS — I509 Heart failure, unspecified: Secondary | ICD-10-CM | POA: Diagnosis not present

## 2017-05-26 DIAGNOSIS — J189 Pneumonia, unspecified organism: Secondary | ICD-10-CM | POA: Diagnosis not present

## 2017-05-26 DIAGNOSIS — I871 Compression of vein: Secondary | ICD-10-CM | POA: Diagnosis not present

## 2017-05-26 DIAGNOSIS — I120 Hypertensive chronic kidney disease with stage 5 chronic kidney disease or end stage renal disease: Secondary | ICD-10-CM | POA: Diagnosis not present

## 2017-05-26 DIAGNOSIS — I5032 Chronic diastolic (congestive) heart failure: Secondary | ICD-10-CM | POA: Diagnosis not present

## 2017-05-26 DIAGNOSIS — R296 Repeated falls: Secondary | ICD-10-CM | POA: Diagnosis not present

## 2017-05-26 DIAGNOSIS — D689 Coagulation defect, unspecified: Secondary | ICD-10-CM | POA: Diagnosis not present

## 2017-05-26 DIAGNOSIS — D509 Iron deficiency anemia, unspecified: Secondary | ICD-10-CM | POA: Diagnosis not present

## 2017-05-26 DIAGNOSIS — E119 Type 2 diabetes mellitus without complications: Secondary | ICD-10-CM | POA: Diagnosis not present

## 2017-05-26 DIAGNOSIS — I2721 Secondary pulmonary arterial hypertension: Secondary | ICD-10-CM | POA: Diagnosis not present

## 2017-05-26 DIAGNOSIS — M810 Age-related osteoporosis without current pathological fracture: Secondary | ICD-10-CM | POA: Diagnosis not present

## 2017-05-26 DIAGNOSIS — N186 End stage renal disease: Secondary | ICD-10-CM | POA: Diagnosis not present

## 2017-05-26 DIAGNOSIS — R278 Other lack of coordination: Secondary | ICD-10-CM | POA: Diagnosis not present

## 2017-05-26 DIAGNOSIS — S52591A Other fractures of lower end of right radius, initial encounter for closed fracture: Secondary | ICD-10-CM | POA: Diagnosis not present

## 2017-05-26 DIAGNOSIS — N400 Enlarged prostate without lower urinary tract symptoms: Secondary | ICD-10-CM | POA: Diagnosis not present

## 2017-05-26 DIAGNOSIS — I4891 Unspecified atrial fibrillation: Secondary | ICD-10-CM | POA: Diagnosis not present

## 2017-05-26 DIAGNOSIS — J449 Chronic obstructive pulmonary disease, unspecified: Secondary | ICD-10-CM | POA: Diagnosis not present

## 2017-05-26 DIAGNOSIS — S42122D Displaced fracture of acromial process, left shoulder, subsequent encounter for fracture with routine healing: Secondary | ICD-10-CM | POA: Diagnosis not present

## 2017-05-26 DIAGNOSIS — E1122 Type 2 diabetes mellitus with diabetic chronic kidney disease: Secondary | ICD-10-CM | POA: Diagnosis not present

## 2017-05-26 DIAGNOSIS — R627 Adult failure to thrive: Secondary | ICD-10-CM | POA: Diagnosis not present

## 2017-05-26 DIAGNOSIS — D631 Anemia in chronic kidney disease: Secondary | ICD-10-CM | POA: Diagnosis not present

## 2017-05-26 DIAGNOSIS — E1151 Type 2 diabetes mellitus with diabetic peripheral angiopathy without gangrene: Secondary | ICD-10-CM | POA: Diagnosis not present

## 2017-05-26 DIAGNOSIS — F329 Major depressive disorder, single episode, unspecified: Secondary | ICD-10-CM | POA: Diagnosis not present

## 2017-05-26 DIAGNOSIS — I770 Arteriovenous fistula, acquired: Secondary | ICD-10-CM | POA: Diagnosis not present

## 2017-05-26 DIAGNOSIS — Z6821 Body mass index (BMI) 21.0-21.9, adult: Secondary | ICD-10-CM | POA: Diagnosis not present

## 2017-05-26 DIAGNOSIS — H903 Sensorineural hearing loss, bilateral: Secondary | ICD-10-CM | POA: Diagnosis not present

## 2017-05-26 DIAGNOSIS — R269 Unspecified abnormalities of gait and mobility: Secondary | ICD-10-CM | POA: Diagnosis not present

## 2017-05-26 DIAGNOSIS — D692 Other nonthrombocytopenic purpura: Secondary | ICD-10-CM | POA: Diagnosis not present

## 2017-05-26 DIAGNOSIS — N179 Acute kidney failure, unspecified: Secondary | ICD-10-CM | POA: Diagnosis not present

## 2017-05-26 DIAGNOSIS — R488 Other symbolic dysfunctions: Secondary | ICD-10-CM | POA: Diagnosis not present

## 2017-05-26 DIAGNOSIS — K922 Gastrointestinal hemorrhage, unspecified: Secondary | ICD-10-CM | POA: Diagnosis not present

## 2017-05-26 DIAGNOSIS — M6281 Muscle weakness (generalized): Secondary | ICD-10-CM | POA: Diagnosis not present

## 2017-05-26 DIAGNOSIS — I130 Hypertensive heart and chronic kidney disease with heart failure and stage 1 through stage 4 chronic kidney disease, or unspecified chronic kidney disease: Secondary | ICD-10-CM | POA: Diagnosis not present

## 2017-05-26 LAB — RENAL FUNCTION PANEL
ALBUMIN: 1.9 g/dL — AB (ref 3.5–5.0)
ANION GAP: 10 (ref 5–15)
BUN: 36 mg/dL — ABNORMAL HIGH (ref 6–20)
CHLORIDE: 97 mmol/L — AB (ref 101–111)
CO2: 26 mmol/L (ref 22–32)
Calcium: 8.4 mg/dL — ABNORMAL LOW (ref 8.9–10.3)
Creatinine, Ser: 4.15 mg/dL — ABNORMAL HIGH (ref 0.61–1.24)
GFR, EST AFRICAN AMERICAN: 13 mL/min — AB (ref >60)
GFR, EST NON AFRICAN AMERICAN: 11 mL/min — AB (ref >60)
Glucose, Bld: 234 mg/dL — ABNORMAL HIGH (ref 65–99)
PHOSPHORUS: 2.7 mg/dL (ref 2.5–4.6)
Potassium: 4.6 mmol/L (ref 3.5–5.1)
Sodium: 133 mmol/L — ABNORMAL LOW (ref 135–145)

## 2017-05-26 LAB — CBC
HCT: 23.1 % — ABNORMAL LOW (ref 39.0–52.0)
Hemoglobin: 7.3 g/dL — ABNORMAL LOW (ref 13.0–17.0)
MCH: 29.4 pg (ref 26.0–34.0)
MCHC: 31.6 g/dL (ref 30.0–36.0)
MCV: 93.1 fL (ref 78.0–100.0)
Platelets: 105 10*3/uL — ABNORMAL LOW (ref 150–400)
RBC: 2.48 MIL/uL — AB (ref 4.22–5.81)
RDW: 18.5 % — AB (ref 11.5–15.5)
WBC: 8 10*3/uL (ref 4.0–10.5)

## 2017-05-26 MED ORDER — HEPARIN SODIUM (PORCINE) 1000 UNIT/ML DIALYSIS: 1000.0000 [IU] | INTRAMUSCULAR | Status: DC | PRN

## 2017-05-26 MED ORDER — TRAMADOL HCL 50 MG PO TABS: 50.0000 mg | ORAL_TABLET | Freq: Two times a day (BID) | ORAL | 0 refills | Status: AC | PRN

## 2017-05-26 MED ORDER — SODIUM CHLORIDE 0.9 % IV SOLN: 100.0000 mL | INTRAVENOUS | Status: DC | PRN

## 2017-05-26 MED ORDER — CEFAZOLIN SODIUM-DEXTROSE 2-4 GM/100ML-% IV SOLN
2.0000 g | INTRAVENOUS | Status: DC
  Filled 2017-05-26: qty 100

## 2017-05-26 MED ORDER — PENTAFLUOROPROP-TETRAFLUOROETH EX AERO
1.0000 "application " | INHALATION_SPRAY | CUTANEOUS | Status: DC | PRN
  Administered 2017-05-26: 1 via TOPICAL

## 2017-05-26 MED ORDER — LIDOCAINE-PRILOCAINE 2.5-2.5 % EX CREA: 1.0000 "application " | TOPICAL_CREAM | CUTANEOUS | Status: DC | PRN

## 2017-05-26 MED ORDER — ALTEPLASE 2 MG IJ SOLR: 2.0000 mg | Freq: Once | INTRAMUSCULAR | Status: DC | PRN

## 2017-05-26 MED ORDER — LIDOCAINE HCL (PF) 1 % IJ SOLN: 5.0000 mL | INTRAMUSCULAR | Status: DC | PRN

## 2017-05-26 NOTE — Care Management Important Message (Signed)
Important Message  Patient Details  Name: Ronald [name] MRN: [id number] Date of Birth: 04-15-1922   Medicare Important Message Given:  Yes    Jonika Critz Montine Circle 05/26/2017, 3:50 PM

## 2017-05-26 NOTE — Clinical Social Work Placement (Addendum)
   CLINICAL SOCIAL WORK PLACEMENT  NOTE 05/26/17 - DISCHARGE DOT [facility] VIA AMBULANCE  Date:  05/26/2017  Patient Details  Name: Ronald [name] MRN: [id number] Date of Birth: 1922/03/29  Clinical Social Work is seeking post-discharge placement for this patient at the [place] level of care (*CSW will initial, date and re-position this form in  chart as items are completed):  No(Family provided CSW with facility preferenc)   Patient/family provided with [place] Work Department's list of facilities offering this level of care within the geographic area requested by the patient (or if unable, by the patient's family).  Yes   Patient/family informed of their freedom to choose among providers that offer the needed level of care, that participate in Medicare, Medicaid or managed care program needed by the patient, have an available bed and are willing to accept the patient.  No   Patient/family informed of [facility]'s ownership interest in [facility] and [facility], as well as of the fact that they are under no obligation to receive care at these facilities.  PASRR submitted to EDS on       PASRR number received on       Existing PASRR number confirmed on 05/23/17     FL2 transmitted to all facilities in geographic area requested by pt/family on 05/24/17     FL2 transmitted to all facilities within larger geographic area on       Patient informed that his/her managed care company has contracts with or will negotiate with certain facilities, including the following:        Yes   Patient/family informed of bed offers received.  Patient [facility] bed at [facility]     Physician recommends and patient chooses bed at      Patient to be transferred to [facility] on 05/26/17.  Patient to be transferred to facility by Ambulance     Patient family notified on 05/26/17 of transfer.  Name of family member notified:  [name] - daughter  by phone - [phone]     PHYSICIAN      Additional Comment:  05/26/17 - Insurance auth received by [name] admissions director Olivia Mackie from [employer]. **CSW received VM from [place] regarding insurance authorization: [id number] effective 4/1 for 7 days. Patient has used 22 days of insurance and will start day 23 at [facility], with a $160 per day do-pay, as he has not had a 60 day wellness period. **Daughter [name] informed of co-pay by phone.   _______________________________________________ Sable Feil, LCSW 05/26/2017, 5:03 PM

## 2017-05-26 NOTE — Consult Note (Signed)
   [facility] Inpatient Consult   05/26/2017  Randle Shatzer [facility] 17-Oct-1922 [phone]   [facility] Care Management follow up.   Spoke with inpatient LCSW. [name] is supposed to discharge to [facility] SNF today.   Will make referral to [facility] for follow up while at [facility].  Marthenia Rolling, MSN-Ed, RN,BSN [facility] Liaison [phone]

## 2017-05-26 NOTE — Progress Notes (Signed)
Patient d/c to [facility] via stretcher by way of [facility]AR. No c/o   discomfort   at time of d/c.

## 2017-05-26 NOTE — NC FL2 (Signed)
[place] MEDICAID FL2 LEVEL OF CARE SCREENING TOOL     IDENTIFICATION  Patient Name: Ronald [name] Birthdate: Feb 18, 1923 Sex: male Admission Date (Current Location): 05/20/2017  [employer] and [place] Number:  Herbalist and Address:  [facility], [place] [address] [place], [place] [address]      Provider Number: [id number]  Attending Physician Name and Address:  Mendel Corning, MD  Relative Name and Phone Number:  Jasmine Maceachern, [phone]; [name] Sitton-dau, [phone]; Tosh W.-dau. [phone]    Current Level of Care: Hospital Recommended Level of Care: [place] Prior Approval Number:    Date Approved/Denied:   PASRR Number: [id number](Eff. 07/07/11)  Discharge Plan: SNF    Current Diagnoses: Patient Active Problem List   Diagnosis Date Noted  . Hemoptysis   . Bacteremia due to methicillin susceptible Staphylococcus aureus (MSSA) 05/21/2017  . Protein-calorie malnutrition, severe 05/21/2017  . Sepsis ([place]) 05/20/2017  . Pressure injury of skin 05/09/2017  . [place]   . Advanced care planning/counseling discussion   . Coagulopathy ([place])   . ESRD (end stage renal disease) ([place]) 05/05/2017  . Acute on chronic congestive heart failure ([place])   . Acute renal failure superimposed on stage 3 chronic kidney disease ([place])   . Goals of care, counseling/discussion   . DNR (do not resuscitate) discussion   . Palliative care encounter   . Acute respiratory distress 03/10/2017  . Skin excoriation 03/10/2017  . Closed fracture of left proximal humerus 12/26/2016  . Comminuted left humeral fracture 12/23/2016  . GI bleed 07/02/2016  . Lactic acidemia 07/02/2016  . Lower GI bleed   . Tobacco use 03/07/2015  . Diabetes mellitus type 2, uncontrolled ([place]) 12/20/2013  . Acute blood loss anemia 12/20/2013  . Allergic rhinitis 12/20/2013  . Intertrochanteric fracture of left femur ([place]) 12/12/2013  . Anemia 12/12/2013  .  Right acetabular fracture ([place]) 04/11/2013  . Acetabular fracture ([place]) 04/11/2013  . Bilateral claudication of lower limb ([place]) 12/24/2012  . Cellulitis 12/02/2012  . History of tobacco abuse- 75 years, quit in Feb 2014 07/06/2012  . Acute on chronic diastolic (congestive) heart failure ([place]) 07/06/2012  . COPD (chronic obstructive pulmonary disease) ([place]) 05/27/2012  . Pulmonary nodules 05/27/2012  . Normal coronary arteries, cath 11/12 07/25/2011  . Normal left ventricular systolic function, Echo 7/65 07/25/2011  . Closed right hip fracture, ORIF 05/31/48 complicated by gluteal hematoma while being Coumadinized post op 06/27/2011  . Aspirin allergy, rash, SOB. Pt is on Plavix 06/19/2011  . Chronic atrial fibrillation ([place]) 06/19/2011  . Thrombocytopenia ([place]) 01/11/2011  . Leukocytosis 01/11/2011  . S/P aortic valve replacement: #23 Magna Ease Edwards Pericardial Valve  November 2012 01/08/2011  . AS (aortic stenosis)- s/p tissue AVR 01/07/11   . CAROTID BRUIT- moderate ICA disease 5/14 05/31/2008  . Diabetes mellitus with complication ([place]) 35/46/5681  . Hyperlipidemia 07/11/2007  . DEPRESSION 07/11/2007  . RESTLESS LEGS SYNDROME 07/11/2007  . Essential hypertension 07/11/2007  . PVD, Rt SFA PTA/HSRA 06/17/11 07/11/2007  . DIVERTICULOSIS, COLON 07/11/2007  . RENAL CYST 07/11/2007  . ARTHRITIS 07/11/2007  . SKIN CANCER, HX OF 07/11/2007  . RHEUMATIC HEART DISEASE, HX OF 07/11/2007    Orientation RESPIRATION BLADDER Height & Weight     Self, Place  Normal External catheter(placed 05/21/17) Weight: 140 lb (63.5 kg) Height:  5\' 11"  (180.3 cm)  BEHAVIORAL SYMPTOMS/MOOD NEUROLOGICAL BOWEL NUTRITION STATUS      Continent Diet(Renal-carb modified with 1200 mL fluid restriction)  AMBULATORY STATUS COMMUNICATION OF NEEDS Skin   Total Care(Patient unable to ambulate with PT)   Other (Comment)(Pressure injury to sacrum with foam dressing, changed PRN, Ecchymosis rihgt/left arm and leg)                        Personal Care Assistance Level of Assistance  Bathing, Feeding, Dressing Bathing Assistance: Maximum assistance Feeding assistance: Limited assistance Dressing Assistance: Maximum assistance     Functional Limitations Info  Sight, Hearing, Speech Sight Info: Impaired(Wears reading glasses) Hearing Info: Impaired Speech Info: Adequate    SPECIAL CARE FACTORS FREQUENCY  PT (By licensed PT), OT (By licensed OT)     PT Frequency: Evaluated 3/28 and a minimum of 2X per week recommended during acute inpatient stay OT Frequency: Evaluated 3/31 and a minimum of 3X per week recommended during acute inpatient stay            Contractures Contractures Info: Not present    Additional Factors Info  Code Status Code Status Info: Full Allergies Info: Aspirin, Tape           Current Medications (05/26/2017):  This is the current hospital active medication list Current Facility-Administered Medications  Medication Dose Route Frequency Provider Last Rate Last Dose  . 0.9 %  sodium chloride infusion  100 mL Intravenous PRN Valentina Gu, NP      . 0.9 %  sodium chloride infusion  100 mL Intravenous PRN Valentina Gu, NP      . acetaminophen (TYLENOL) tablet 650 mg  650 mg Oral Q8H PRN Lovey Newcomer T, NP   650 mg at 05/23/17 2138  . alteplase (CATHFLO ACTIVASE) injection 2 mg  2 mg Intracatheter Once PRN Valentina Gu, NP      . ceFAZolin (ANCEF) IVPB 2g/100 mL premix  2 g Intravenous Q M,W,F-1800 Alvira Philips, [place]      . Darbepoetin Alfa (ARANESP) injection 200 mcg  200 mcg Intravenous Q Sat-HD Valentina Gu, NP      . feeding supplement (NEPRO CARB STEADY) liquid 237 mL  237 mL Oral TID BM Brand Males, MD   237 mL at 05/23/17 2138  . heparin injection 1,000 Units  1,000 Units Dialysis PRN Valentina Gu, NP      . ipratropium-albuterol (DUONEB) 0.5-2.5 (3) MG/3ML nebulizer solution 3 mL  3 mL Nebulization Q6H PRN  Jennelle Human B, NP      . lidocaine (PF) (XYLOCAINE) 1 % injection 5 mL  5 mL Intradermal PRN Valentina Gu, NP      . lidocaine-prilocaine (EMLA) cream 1 application  1 application Topical PRN Valentina Gu, NP      . pantoprazole (PROTONIX) EC tablet 40 mg  40 mg Oral Q0600 Vena Rua, PA-C   40 mg at 05/26/17 2025  . pentafluoroprop-tetrafluoroeth (GEBAUERS) aerosol 1 application  1 application Topical PRN Valentina Gu, NP      . umeclidinium-vilanterol (ANORO ELLIPTA) 62.5-25 MCG/INH 1 puff  1 puff Inhalation Daily Jennelle Human B, NP   1 puff at 05/26/17 4270     Discharge Medications: Please see discharge summary for a list of discharge medications.  Relevant Imaging Results:  Relevant Lab Results:   Additional Information ss#[id number]. Dialysis patient MWF at [facility], [place], [place]

## 2017-05-26 NOTE — Progress Notes (Signed)
PT Cancellation Note  Patient Details Name: ADRIANA LINA MRN: [id number] DOB: 1923-02-22   Cancelled Treatment:    Reason Eval/Treat Not Completed: (P) Patient at procedure or test/unavailable(Pt off unit for dialysis.  )   Cristela Blue 05/26/2017, 12:28 PM  [name], PTA pager [phone]

## 2017-05-26 NOTE — Progress Notes (Addendum)
[facility] Progress Note   Subjective:  Seen on HD. 1.5L UF goal. No c/o at this time except sick on being in a bed.  Objective Vitals:   05/25/17 1800 05/25/17 2040 05/26/17 0456 05/26/17 0854  BP: (!) 127/57 (!) 147/63 (!) 144/58 (!) 138/56  Pulse: 90 (!) 116 (!) 110 (!) 108  Resp: 16 17 16 16   Temp: 97.8 F (36.6 C) 99.6 F (37.6 C) 98.4 F (36.9 C) 98.3 F (36.8 C)  TempSrc: Oral   Oral  SpO2: 96% 99% 99% 99%  Weight:  63.5 kg (140 lb)    Height:       Physical Exam General: Frail, elderly man, NAD Heart: RRR; no murmur Lungs: CTA anteriorly Extremities: No LE edema Dialysis Access:  LUE AVF + heavy bruising/cannulated  Additional Objective Labs: Basic Metabolic Panel: Recent Labs  Lab 05/21/17 0403 05/23/17 1240 05/26/17 0930  NA 136 134* 133*  K 3.9 4.2 4.6  CL 99* 99* 97*  CO2 23 25 26   GLUCOSE 144* 189* 234*  BUN 37* 49* 36*  CREATININE 4.42* 4.72* 4.15*  CALCIUM 7.5* 8.6* 8.4*  PHOS 5.7* 3.0 2.7   Liver Function Tests: Recent Labs  Lab 05/20/17 1352 05/23/17 1240 05/26/17 0930  AST 43*  --   --   ALT 18  --   --   ALKPHOS 79  --   --   BILITOT 1.2  --   --   PROT 6.5  --   --   ALBUMIN 2.5* 2.0* 1.9*   CBC: Recent Labs  Lab 05/20/17 1352  05/21/17 0403 05/21/17 1419 05/23/17 1240 05/26/17 0949  WBC 18.9*  --  13.9* 20.1* 10.4 8.0  NEUTROABS 17.6*  --   --   --   --   --   HGB 9.6*   < > 8.4* 9.6* 8.1* 7.3*  HCT 30.8*   < > 27.0* 30.8* 25.8* 23.1*  MCV 99.0  --  96.8 96.3 94.5 93.1  PLT 100*  --  70* 74* 54* 105*   < > = values in this interval not displayed.   Blood Culture    Component Value Date/Time   SDES BLOOD BLOOD RIGHT HAND 05/22/2017 0649   SDES BLOOD RIGHT ANTECUBITAL 05/22/2017 0649   SPECREQUEST  05/22/2017 0649    BOTTLES DRAWN AEROBIC AND ANAEROBIC Blood Culture adequate volume   SPECREQUEST  05/22/2017 0649    BOTTLES DRAWN AEROBIC ONLY Blood Culture adequate volume   CULT  05/22/2017 0649    NO GROWTH 3 DAYS Performed at [facility], [place] [address], [place], [place] [address]    CULT  05/22/2017 450 200 0264    NO GROWTH 3 DAYS Performed at [facility], [place] [address], [place], [place] [address]    REPTSTATUS PENDING 05/22/2017 1443   REPTSTATUS PENDING 05/22/2017 0649   Medications: . sodium chloride    . sodium chloride    .  ceFAZolin (ANCEF) IV     . darbepoetin (ARANESP) injection - DIALYSIS  200 mcg Intravenous Q Sat-HD  . feeding supplement (NEPRO CARB STEADY)  237 mL Oral TID BM  . pantoprazole  40 mg Oral Q0600  . umeclidinium-vilanterol  1 puff Inhalation Daily    Dialysis Orders: MWF at [facility] 4hr, 400/800, EDW 69.5kg, 2K/2.5Ca, no heparin, TDC and L AVG - No VDRA or binders - Mircera 165mcg IV q 2 weeks (last 3/18)  Assessment/Plan: 1Septic shock 2/2 MSSA Bacteremia: likely from [facility]  which has been removed, greatly appreciate VVS.No evidence of endocarditis by TTE. ID following, no plans for TEE.Repeat BCx 3/28 negative. Continue cefazolin for 6 weeks. 2.?GI bleed: Presented with blood all over shirtupon ED arrival but recent EGD was WNL. NO plans for further testing per GI. Etiology possible epistaxis. 3ESRD: Will continue HD per MWF schedule. No heparin. 4Hypertension/volume:BP appears well controlled.No antihypertensive meds. Will need lower EDW on DC.  5. Anemia of ESRD:Hgb down to 7.3. On max ESA at OP center, dose Aranesp 200 mcg q week(given 3/27). If gets lower, will plan on transfusion. 6. Metabolic Bone Disease:Ca ok, Phos now low-side, likely stop binders soon if drops lower. 7. Afib: Not on anticoagulation.  8.Nutrition: Albumin 1.9. Add prostat, renal vits. On renal diet w/fluid restrictions.  9.Dispo:DC to SNF when facility available. Continue Ancef for 6 weeks April 1st-Jul 04, 2017 per ID recommendations.Patient wishes to continue hemodialysis.    Veneta Penton, PA-C 05/26/2017, 12:45 PM  [facility] Pager: [phone]  Pt seen, examined and agree w A/P as above.  Kelly Splinter MD [employer] pager [phone]   05/26/2017, 2:05 PM

## 2017-05-26 NOTE — Clinical Social Work Note (Signed)
Patient medically stable for discharge and will be going to [facility] for [employer]. Insurance authorization received by [employer] from [employer]. Discharge clinicals transmitted to [facility] and talked with daughter [name] regarding patient's discharge. CSW signing off, however please reconsult if any SW intervention services needed prior to patient's discharge.  Azariah Bonura Givens, MSW, LCSW Licensed Clinical Social Worker [place] [phone]

## 2017-05-26 NOTE — Progress Notes (Signed)
Patient tolerated HD trmt well.  Reviewed HD trmt orders for today prior to trmt initiation.  All questions asked by patient were answered.

## 2017-05-27 ENCOUNTER — Ambulatory Visit: Payer: PPO | Admitting: Nurse Practitioner

## 2017-05-27 DIAGNOSIS — N400 Enlarged prostate without lower urinary tract symptoms: Secondary | ICD-10-CM | POA: Diagnosis not present

## 2017-05-27 DIAGNOSIS — J449 Chronic obstructive pulmonary disease, unspecified: Secondary | ICD-10-CM | POA: Diagnosis not present

## 2017-05-27 DIAGNOSIS — R7881 Bacteremia: Secondary | ICD-10-CM | POA: Diagnosis not present

## 2017-05-27 DIAGNOSIS — F329 Major depressive disorder, single episode, unspecified: Secondary | ICD-10-CM | POA: Diagnosis not present

## 2017-05-27 LAB — CULTURE, BLOOD (ROUTINE X 2)
CULTURE: NO GROWTH
Culture: NO GROWTH
SPECIAL REQUESTS: ADEQUATE
Special Requests: ADEQUATE

## 2017-05-28 DIAGNOSIS — D689 Coagulation defect, unspecified: Secondary | ICD-10-CM | POA: Diagnosis not present

## 2017-05-28 DIAGNOSIS — D509 Iron deficiency anemia, unspecified: Secondary | ICD-10-CM | POA: Diagnosis not present

## 2017-05-28 DIAGNOSIS — N186 End stage renal disease: Secondary | ICD-10-CM | POA: Diagnosis not present

## 2017-05-28 DIAGNOSIS — D649 Anemia, unspecified: Secondary | ICD-10-CM | POA: Diagnosis not present

## 2017-05-28 DIAGNOSIS — R7881 Bacteremia: Secondary | ICD-10-CM | POA: Diagnosis not present

## 2017-05-29 ENCOUNTER — Other Ambulatory Visit: Payer: Self-pay | Admitting: Licensed Clinical Social Worker

## 2017-05-29 NOTE — Patient Outreach (Signed)
[facility]) Care Management  [facility] Social Work  05/29/2017  Ronald [name] 03/16/1922 [id number]  Subjective:    Objective:   Encounter Medications:  Outpatient Encounter Medications as of 05/29/2017  Medication Sig  . acetaminophen (TYLENOL) 325 MG tablet Take 650 mg by mouth every 6 (six) hours as needed ("for pain score 1-4; not to exceed #8 tablets in 24 hours").   Marland Kitchen albuterol (ACCUNEB) 0.63 MG/3ML nebulizer solution Take 3 mLs by nebulization every 6 (six) hours as needed for wheezing or shortness of breath.  Marland Kitchen buPROPion (WELLBUTRIN XL) 150 MG 24 hr tablet Take 150 mg by mouth daily.   Marland Kitchen ceFAZolin (ANCEF) IVPB Inject 2 g into the vein every Monday, Wednesday, and Friday with hemodialysis. Indication:  MSSA bactermia Last Day of Therapy:  07/01/2017 Labs - Once weekly:  CBC/D and BMP, Labs - Every other week:  ESR and CRP  . diphenhydrAMINE (BENADRYL) 12.5 MG/5ML elixir Take 5 mLs (12.5 mg total) by mouth every 6 (six) hours as needed for itching. Over the counter (Patient taking differently: Take 12.5 mg by mouth every 6 (six) hours as needed for itching. )  . doxazosin (CARDURA) 2 MG tablet Take 1 tablet (2 mg total) by mouth at bedtime.  . ferrous sulfate 325 (65 FE) MG EC tablet Take 1 tablet (325 mg total) by mouth 2 (two) times daily. (Patient taking differently: Take 325 mg by mouth 2 (two) times daily with a meal. )  . finasteride (PROSCAR) 5 MG tablet Take 5 mg by mouth at bedtime.   . fluticasone (FLONASE) 50 MCG/ACT nasal spray Place 1 spray into both nostrils 2 (two) times daily.   Marland Kitchen glipiZIDE (GLUCOTROL) 5 MG tablet Take 5 mg by mouth daily before breakfast.  . loratadine (CLARITIN) 10 MG tablet Take 10 mg by mouth daily.  . Melatonin 5 MG TABS Take 5 mg by mouth at bedtime.  . metoprolol succinate (TOPROL-XL) 25 MG 24 hr tablet Take 0.5 tablets (12.5 mg total) by mouth 2 (two) times daily with a meal.  . Multiple Vitamins-Minerals (MULTIVITAMIN WITH  MINERALS) tablet Take 1 tablet by mouth at bedtime.  Marland Kitchen neomycin-bacitracin-polymyxin (NEOSPORIN) ointment Apply 1 application topically daily. Apply to left elbow and forearm daily for 10 days. Started 05/14/17.  Marland Kitchen OXYGEN Inhale 2 L into the lungs continuous.  . pantoprazole (PROTONIX) 40 MG tablet Take 1 tablet (40 mg total) by mouth daily.  . simvastatin (ZOCOR) 20 MG tablet Take 20 mg by mouth every evening.  . traMADol (ULTRAM) 50 MG tablet Take 1 tablet (50 mg total) by mouth every 12 (twelve) hours as needed for moderate pain.  Marland Kitchen Umeclidinium-Vilanterol (ANORO ELLIPTA) 62.5-25 MCG/INH AEPB Inhale 1 puff into the lungs daily.   No facility-administered encounter medications on file as of 05/29/2017.     Functional Status:  In your present state of health, do you have any difficulty performing the following activities: 05/20/2017 05/06/2017  Hearing? [name]  Vision? N N  Difficulty concentrating or making decisions? N N  Comment - -  Walking or climbing stairs? Y N  Dressing or bathing? Y N  Doing errands, shopping? Y Y  Comment - children help  Some recent data might be hidden    Fall/Depression Screening:  PHQ 2/9 Scores 05/05/2017 11/30/2014  PHQ - 2 Score 3 0  PHQ- 9 Score 8 -    Assessment:   CSW traveled to [place] facility on 05/29/17 to visit client.  CSW met client on 05/29/17 at client's room at [facility] facility. Client is at nursing facility to receive nursing care and physical therapy support. CSW verified client identity. CSW received verbal permission from client on 05/29/17 for CSW to speak with client about client needs. Client said he received dialysis 3 times weekly.  He said he is receiving physical therapy sessions as scheduled at nursing facility. He has family support. He said that his daughters have been visiting him at facility. CSW gave [name] CSW card and encouraged [name] to call CSW if needed to discuss social work needs of client. Client has  only been at facility a few days and was not sure how long physical therapy sessions for client will continue. He said he needed help with activities of daily living. He said he was eating well. He sees Dr. [name] as primary doctor in the community Client said he hopes to receive nursing care and physical therapy support at facility and hopes to  discharge home, when able, with needed supports in place. He said he was thinking about in home care support for him to utilize upon discharge from facility. CSW thanked client for allowing CSW to visit him at facility on 05/29/17. Client was appreciative of CSW visit with client on 05/29/17.  Plan:   CSW to call client in 1 week to assess client needs.   Norva Riffle.Lynx Goodrich MSW, LCSW Licensed Clinical Social Worker [facility] Care Management [phone]

## 2017-06-02 ENCOUNTER — Other Ambulatory Visit: Payer: Self-pay

## 2017-06-02 DIAGNOSIS — J449 Chronic obstructive pulmonary disease, unspecified: Secondary | ICD-10-CM | POA: Diagnosis not present

## 2017-06-02 DIAGNOSIS — E119 Type 2 diabetes mellitus without complications: Secondary | ICD-10-CM | POA: Diagnosis not present

## 2017-06-02 DIAGNOSIS — R7881 Bacteremia: Secondary | ICD-10-CM | POA: Diagnosis not present

## 2017-06-02 DIAGNOSIS — I509 Heart failure, unspecified: Secondary | ICD-10-CM | POA: Diagnosis not present

## 2017-06-02 NOTE — Patient Outreach (Signed)
[name]) Care Management  06/02/2017  Ronald [name] [facility] [birth year] [id number]   Assessment: 84 year old admitted 2/14-3/2 with heart failure; new hemodialysis patient. History of heart failure, COPD,atrial fibrillation; diabetes; lower Gastro-Intestinal bleed. Client admitted 05/20/17-05/26/17 with Sepsis/Bacteremia.  Client discharged to [facility]. Ronald Forrest, LCSW assigned. RNCM discussed case with [name], contact person Ronald [name] (power of attorney) name and number provided also provided client's daughter Ronald [name] name and contact number.  Plan: RNCM will no longer be involved in case.   Ronald Silversmith, RN, MSN, [place] Coordinator Cell: [phone]

## 2017-06-03 DIAGNOSIS — H903 Sensorineural hearing loss, bilateral: Secondary | ICD-10-CM | POA: Diagnosis not present

## 2017-06-06 ENCOUNTER — Other Ambulatory Visit: Payer: Self-pay | Admitting: Licensed Clinical Social Worker

## 2017-06-06 NOTE — Patient Outreach (Signed)
Assessment:  CSW spoke via phone with Ronald [name], responsible party and son of client.  CSW verified identity of Ronald [name]. CSW received verbal permission from Eisen Robenson on 06/06/17 for CSW to speak with [name] about client needs.  [name] said client is planning to discharge from [facility] facility next Tuesday between 10:30 AM ant 11:00 AM next Tuesday morning. Client does attend dialysis treatments 3 times weekly. [name] said that  upon client discharge from nursing center, client plans to return to his home at [address], [place], [place]. [name] said that the plan is for private care company, [facility], to assist clent as needed with in home care upon client's returning to his home in the community. [name] said that it is best for care providers to contact Ronald [name] at his cell number of [phone].  [name] is a Media planner for client   [name] said client also has an Transport planner at home to use upon discharge from nursing center.  Since client is discharging from nursing center next Tuesday, [place] ts transferring [place] support for client to [place] on 06/06/17.              Plan:  [facility] CSW [name] to contact client or Ronald [name], son of client in 2 weeks to assess client needs.   Norva Riffle.Kamaryn Grimley MSW, LCSW Licensed Clinical Social Worker [facility] Care Management [phone]

## 2017-06-08 DIAGNOSIS — D649 Anemia, unspecified: Secondary | ICD-10-CM | POA: Diagnosis not present

## 2017-06-08 DIAGNOSIS — N186 End stage renal disease: Secondary | ICD-10-CM | POA: Diagnosis not present

## 2017-06-08 DIAGNOSIS — Z6821 Body mass index (BMI) 21.0-21.9, adult: Secondary | ICD-10-CM | POA: Diagnosis not present

## 2017-06-08 DIAGNOSIS — I770 Arteriovenous fistula, acquired: Secondary | ICD-10-CM | POA: Diagnosis not present

## 2017-06-08 DIAGNOSIS — I130 Hypertensive heart and chronic kidney disease with heart failure and stage 1 through stage 4 chronic kidney disease, or unspecified chronic kidney disease: Secondary | ICD-10-CM | POA: Diagnosis not present

## 2017-06-08 DIAGNOSIS — Z794 Long term (current) use of insulin: Secondary | ICD-10-CM | POA: Diagnosis not present

## 2017-06-08 DIAGNOSIS — D692 Other nonthrombocytopenic purpura: Secondary | ICD-10-CM | POA: Diagnosis not present

## 2017-06-08 DIAGNOSIS — I2721 Secondary pulmonary arterial hypertension: Secondary | ICD-10-CM | POA: Diagnosis not present

## 2017-06-08 DIAGNOSIS — E1151 Type 2 diabetes mellitus with diabetic peripheral angiopathy without gangrene: Secondary | ICD-10-CM | POA: Diagnosis not present

## 2017-06-08 DIAGNOSIS — Z992 Dependence on renal dialysis: Secondary | ICD-10-CM | POA: Diagnosis not present

## 2017-06-08 DIAGNOSIS — R627 Adult failure to thrive: Secondary | ICD-10-CM | POA: Diagnosis not present

## 2017-06-08 DIAGNOSIS — K922 Gastrointestinal hemorrhage, unspecified: Secondary | ICD-10-CM | POA: Diagnosis not present

## 2017-06-10 ENCOUNTER — Other Ambulatory Visit (HOSPITAL_COMMUNITY): Payer: Self-pay | Admitting: Nephrology

## 2017-06-10 ENCOUNTER — Other Ambulatory Visit: Payer: Self-pay | Admitting: Student

## 2017-06-10 DIAGNOSIS — T82858A Stenosis of vascular prosthetic devices, implants and grafts, initial encounter: Secondary | ICD-10-CM | POA: Diagnosis not present

## 2017-06-10 DIAGNOSIS — N186 End stage renal disease: Secondary | ICD-10-CM | POA: Diagnosis not present

## 2017-06-10 DIAGNOSIS — I871 Compression of vein: Secondary | ICD-10-CM | POA: Diagnosis not present

## 2017-06-10 DIAGNOSIS — Z992 Dependence on renal dialysis: Secondary | ICD-10-CM | POA: Diagnosis not present

## 2017-06-10 DIAGNOSIS — I878 Other specified disorders of veins: Secondary | ICD-10-CM

## 2017-06-11 ENCOUNTER — Other Ambulatory Visit (HOSPITAL_COMMUNITY): Payer: Self-pay | Admitting: Nephrology

## 2017-06-11 ENCOUNTER — Encounter (HOSPITAL_COMMUNITY): Payer: Self-pay

## 2017-06-11 ENCOUNTER — Ambulatory Visit (HOSPITAL_COMMUNITY)
Admission: RE | Admit: 2017-06-11 | Discharge: 2017-06-11 | Disposition: A | Discharge status: Home / Self Care | Payer: PPO | Source: Ambulatory Visit | Attending: Nephrology | Admitting: Nephrology

## 2017-06-11 ENCOUNTER — Other Ambulatory Visit: Payer: Self-pay

## 2017-06-11 ENCOUNTER — Other Ambulatory Visit (HOSPITAL_COMMUNITY): Payer: PPO

## 2017-06-11 DIAGNOSIS — Z9841 Cataract extraction status, right eye: Secondary | ICD-10-CM | POA: Diagnosis not present

## 2017-06-11 DIAGNOSIS — I878 Other specified disorders of veins: Secondary | ICD-10-CM

## 2017-06-11 DIAGNOSIS — Z953 Presence of xenogenic heart valve: Secondary | ICD-10-CM | POA: Insufficient documentation

## 2017-06-11 DIAGNOSIS — Z9889 Other specified postprocedural states: Secondary | ICD-10-CM | POA: Diagnosis not present

## 2017-06-11 DIAGNOSIS — Z7984 Long term (current) use of oral hypoglycemic drugs: Secondary | ICD-10-CM | POA: Insufficient documentation

## 2017-06-11 DIAGNOSIS — F17210 Nicotine dependence, cigarettes, uncomplicated: Secondary | ICD-10-CM | POA: Insufficient documentation

## 2017-06-11 DIAGNOSIS — Z9049 Acquired absence of other specified parts of digestive tract: Secondary | ICD-10-CM | POA: Insufficient documentation

## 2017-06-11 DIAGNOSIS — I482 Chronic atrial fibrillation: Secondary | ICD-10-CM | POA: Insufficient documentation

## 2017-06-11 DIAGNOSIS — Z888 Allergy status to other drugs, medicaments and biological substances status: Secondary | ICD-10-CM | POA: Insufficient documentation

## 2017-06-11 DIAGNOSIS — Z955 Presence of coronary angioplasty implant and graft: Secondary | ICD-10-CM | POA: Insufficient documentation

## 2017-06-11 DIAGNOSIS — E1140 Type 2 diabetes mellitus with diabetic neuropathy, unspecified: Secondary | ICD-10-CM | POA: Insufficient documentation

## 2017-06-11 DIAGNOSIS — Z9842 Cataract extraction status, left eye: Secondary | ICD-10-CM | POA: Insufficient documentation

## 2017-06-11 DIAGNOSIS — F329 Major depressive disorder, single episode, unspecified: Secondary | ICD-10-CM | POA: Diagnosis not present

## 2017-06-11 DIAGNOSIS — Z8551 Personal history of malignant neoplasm of bladder: Secondary | ICD-10-CM | POA: Insufficient documentation

## 2017-06-11 DIAGNOSIS — Z7951 Long term (current) use of inhaled steroids: Secondary | ICD-10-CM | POA: Insufficient documentation

## 2017-06-11 DIAGNOSIS — Z823 Family history of stroke: Secondary | ICD-10-CM | POA: Insufficient documentation

## 2017-06-11 DIAGNOSIS — Z8719 Personal history of other diseases of the digestive system: Secondary | ICD-10-CM | POA: Diagnosis not present

## 2017-06-11 DIAGNOSIS — I5032 Chronic diastolic (congestive) heart failure: Secondary | ICD-10-CM | POA: Diagnosis not present

## 2017-06-11 DIAGNOSIS — Z79899 Other long term (current) drug therapy: Secondary | ICD-10-CM | POA: Insufficient documentation

## 2017-06-11 DIAGNOSIS — Z886 Allergy status to analgesic agent status: Secondary | ICD-10-CM | POA: Insufficient documentation

## 2017-06-11 DIAGNOSIS — Z4901 Encounter for fitting and adjustment of extracorporeal dialysis catheter: Secondary | ICD-10-CM | POA: Diagnosis not present

## 2017-06-11 DIAGNOSIS — I132 Hypertensive heart and chronic kidney disease with heart failure and with stage 5 chronic kidney disease, or end stage renal disease: Secondary | ICD-10-CM | POA: Insufficient documentation

## 2017-06-11 DIAGNOSIS — E1122 Type 2 diabetes mellitus with diabetic chronic kidney disease: Secondary | ICD-10-CM | POA: Insufficient documentation

## 2017-06-11 DIAGNOSIS — E1151 Type 2 diabetes mellitus with diabetic peripheral angiopathy without gangrene: Secondary | ICD-10-CM | POA: Diagnosis not present

## 2017-06-11 DIAGNOSIS — J449 Chronic obstructive pulmonary disease, unspecified: Secondary | ICD-10-CM | POA: Insufficient documentation

## 2017-06-11 DIAGNOSIS — Z8249 Family history of ischemic heart disease and other diseases of the circulatory system: Secondary | ICD-10-CM | POA: Insufficient documentation

## 2017-06-11 DIAGNOSIS — N186 End stage renal disease: Secondary | ICD-10-CM | POA: Diagnosis not present

## 2017-06-11 DIAGNOSIS — N19 Unspecified kidney failure: Secondary | ICD-10-CM | POA: Diagnosis not present

## 2017-06-11 DIAGNOSIS — Z800 Family history of malignant neoplasm of digestive organs: Secondary | ICD-10-CM | POA: Insufficient documentation

## 2017-06-11 HISTORY — PX: IR FLUORO GUIDE CV LINE RIGHT: IMG2283

## 2017-06-11 HISTORY — PX: IR US GUIDE VASC ACCESS RIGHT: IMG2390

## 2017-06-11 LAB — CBC
HEMATOCRIT: 28.6 % — AB (ref 39.0–52.0)
Hemoglobin: 8.7 g/dL — ABNORMAL LOW (ref 13.0–17.0)
MCH: 28.2 pg (ref 26.0–34.0)
MCHC: 30.4 g/dL (ref 30.0–36.0)
MCV: 92.6 fL (ref 78.0–100.0)
Platelets: 156 10*3/uL (ref 150–400)
RBC: 3.09 MIL/uL — AB (ref 4.22–5.81)
RDW: 18.5 % — ABNORMAL HIGH (ref 11.5–15.5)
WBC: 6.2 10*3/uL (ref 4.0–10.5)

## 2017-06-11 LAB — PROTIME-INR
INR: 1.42
Prothrombin Time: 17.2 seconds — ABNORMAL HIGH (ref 11.4–15.2)

## 2017-06-11 LAB — APTT: aPTT: 41 seconds — ABNORMAL HIGH (ref 24–36)

## 2017-06-11 LAB — GLUCOSE, CAPILLARY: GLUCOSE-CAPILLARY: 174 mg/dL — AB (ref 65–99)

## 2017-06-11 MED ORDER — CEFAZOLIN SODIUM-DEXTROSE 2-4 GM/100ML-% IV SOLN
INTRAVENOUS | Status: DC
Start: 2017-06-11 — End: 2017-06-12
  Filled 2017-06-11: qty 100

## 2017-06-11 MED ORDER — SODIUM CHLORIDE 0.9 % IV SOLN
INTRAVENOUS | Status: DC
Start: 2017-06-11 — End: 2017-06-12
  Administered 2017-06-11: 15:00:00 via INTRAVENOUS

## 2017-06-11 MED ORDER — MIDAZOLAM HCL 2 MG/2ML IJ SOLN
INTRAMUSCULAR | Status: AC
  Filled 2017-06-11: qty 4

## 2017-06-11 MED ORDER — FENTANYL CITRATE (PF) 100 MCG/2ML IJ SOLN
INTRAMUSCULAR | Status: AC
  Filled 2017-06-11: qty 4

## 2017-06-11 MED ORDER — LIDOCAINE HCL (PF) 1 % IJ SOLN
INTRAMUSCULAR | Status: AC | PRN
  Administered 2017-06-11: 5 mL

## 2017-06-11 MED ORDER — FENTANYL CITRATE (PF) 100 MCG/2ML IJ SOLN
INTRAMUSCULAR | Status: AC | PRN
  Administered 2017-06-11: 50 ug via INTRAVENOUS

## 2017-06-11 MED ORDER — MIDAZOLAM HCL 2 MG/2ML IJ SOLN
INTRAMUSCULAR | Status: AC | PRN
  Administered 2017-06-11: 1 mg via INTRAVENOUS

## 2017-06-11 MED ORDER — LIDOCAINE HCL 1 % IJ SOLN
INTRAMUSCULAR | Status: AC
  Filled 2017-06-11: qty 20

## 2017-06-11 MED ORDER — CEFAZOLIN SODIUM-DEXTROSE 2-4 GM/100ML-% IV SOLN
2.0000 g | Freq: Once | INTRAVENOUS | Status: AC
  Administered 2017-06-11: 2 g via INTRAVENOUS

## 2017-06-11 MED ORDER — HEPARIN SODIUM (PORCINE) 1000 UNIT/ML IJ SOLN
INTRAMUSCULAR | Status: AC
  Administered 2017-06-11: 3.8 mL
  Filled 2017-06-11: qty 1

## 2017-06-11 NOTE — Sedation Documentation (Signed)
Patient is resting comfortably. 

## 2017-06-11 NOTE — H&P (Signed)
Chief Complaint: Patient was seen in consultation today for tunneled HD catheter at the request of Gordo W  Referring Physician(s): Otelia Santee W  Supervising Physician: Arne Cleveland  Patient Status: [facility] - Out-pt  History of Present Illness: Ronald [name] is a 84 y.o. male with ESRD. He had a (R)IJ TDC placed on 2/21. Also had left UE AVG created. Had to have the [facility] removed due to infectious concerns. Catheter was actually removed by vascular team on 3/27. Has been using AVG until this week, apparently thrombosed and unable to declot. Pt referred for placement of new TDC. PMHx, meds, labs, imaging, allergies reviewed. Feels well, no recent fevers, chills, illness. Has been NPO today as directed. Family at bedside.   Past Medical History:  Diagnosis Date  . AS (aortic stenosis)    bovine aortic valve replacement 01/07/11  . Bladder cancer [facility])    Bladder Cancer local  . Blood transfusion    w/hip operation  . Cellulitis of left lower extremity   . Chronic atrial fibrillation ([place])   . Chronic diastolic congestive heart failure ([place])   . Claudication in peripheral vascular disease ([place]) 06/17/2011  . COPD (chronic obstructive pulmonary disease) ([place])   . Depression wife died 4 years ago.    Marland Kitchen History of stomach ulcers ~ 1951  . HTN (hypertension)   . Neuropathy due to secondary diabetes ([place])   . Peripheral vascular disease ([place]) very poor circulation legs and feet ... stents right and left legs... done in dr j. Gwenlyn Found 's office.   . Pneumonia 07/25/11   left  . Recurrent upper respiratory infection (URI)    sinusitis  . Renal artery stenosis ([place]) 2006   renal artery stent  . S/P angioplasty with stent, diamond back rotational athrectomy Prox. Rt. SFA 06/17/2011 06/17/2011  . Shortness of breath   . Thrombocytopenia due to drugs    seen by Dr Inda Merlin plts [id number] no rx  . Type II diabetes mellitus ([place])     Past Surgical History:  Procedure Laterality Date   . ABDOMINAL ANGIOGRAM  06/17/2011   Procedure: ABDOMINAL ANGIOGRAM;  Surgeon: Lorretta Harp, MD;  Location: [facility] CATH LAB;  Service: Cardiovascular;;  . AORTIC VALVE REPLACEMENT  01/07/2011   Procedure: AORTIC VALVE REPLACEMENT (AVR);  Surgeon: Grace Isaac, MD;  Location: [place];  Service: Open Heart Surgery;  Laterality: N/A;; magna-ease bovine 55m bioprosthesis  . ATHERECTOMY N/A 06/17/2011   Procedure: ATHERECTOMY;  Surgeon: JLorretta Harp MD;  [facility];  Service: Cardiovascular;  Laterality: N/A;  . AV FISTULA PLACEMENT Left 04/24/2017   Procedure: INSERTION OF ARTERIOVENOUS (AV) GORE-TEX GRAFT ARM;  Surgeon: CConrad [place] MD;  [place]  Service: Vascular;  Laterality: Left;  . CARDIAC CATHETERIZATION  11/19/10   normal coronaries, mod AS, 75% l RAS  . CARDIOVERSION  04/03/2011   Procedure: CARDIOVERSION;  Surgeon: DLeonie Man MD;  [place]  Service: Cardiovascular;  Laterality: N/A;  . CATARACT EXTRACTION W/ INTRAOCULAR LENS  IMPLANT, BILATERAL  ~ 2007  . CHOLECYSTECTOMY    . ESOPHAGOGASTRODUODENOSCOPY N/A 05/08/2017   Procedure: ESOPHAGOGASTRODUODENOSCOPY (EGD);  Surgeon: PIrene Shipper MD;  [facility];  Service: Endoscopy;  Laterality: N/A;  . FEMUR IM NAIL  06/27/2011   Procedure: INTRAMEDULLARY (IM) NAIL FEMORAL;  Surgeon: KMarin Shutter MD;  Location: [facility];  Service: Orthopedics;  Laterality: Right;  . FEMUR IM NAIL Left 12/14/2013   Procedure: INTRAMEDULLARY (IM) NAIL  FEMORAL;  Surgeon: Mauri Pole, MD;  Location: [facility] ORS;  Service: Orthopedics;  Laterality: Left;  . FRACTURE SURGERY    . IR FLUORO GUIDE CV LINE LEFT  04/17/2017  . IR US GUIDE VASC ACCESS LEFT  04/17/2017  . LOWER EXTREMITY ANGIOGRAM  06/17/2011   diamondback orbital rotational and cutting balloon atherectomy of the prox R SFA  . LOWER EXTREMITY ANGIOGRAM Bilateral 06/17/2011   Procedure: LOWER EXTREMITY ANGIOGRAM;  Surgeon: Lorretta Harp, MD;  Location: [facility]  CATH LAB;  Service: Cardiovascular;  Laterality: Bilateral;  . PERIPHERAL ARTERIAL STENT GRAFT     2006 left anf right illiac stents Dr Deon Pilling  . RENAL ARTERY STENT  2006   "I believe"  . REVERSE SHOULDER ARTHROPLASTY Left 12/31/2016   Procedure: LEFT REVERSE SHOULDER ARTHROPLASTY;  Surgeon: Nicholes Stairs, MD;  Location: [place];  Service: Orthopedics;  Laterality: Left;  . TONSILLECTOMY AND ADENOIDECTOMY     "when I was a kid"    Allergies: Aspirin and Tape  Medications: Prior to Admission medications   Medication Sig Start Date End Date Taking? Authorizing Provider  acetaminophen (TYLENOL) 325 MG tablet Take 650 mg by mouth every 6 (six) hours as needed ("for pain score 1-4; not to exceed #8 tablets in 24 hours").     [provider]  albuterol (ACCUNEB) 0.63 MG/3ML nebulizer solution Take 3 mLs by nebulization every 6 (six) hours as needed for wheezing or shortness of breath.    [provider]  buPROPion (WELLBUTRIN XL) 150 MG 24 hr tablet Take 150 mg by mouth daily.     [provider]  ceFAZolin (ANCEF) IVPB Inject 2 g into the vein every Monday, Wednesday, and Friday with hemodialysis. Indication:  MSSA bactermia Last Day of Therapy:  07/01/2017 Labs - Once weekly:  CBC/D and BMP, Labs - Every other week:  ESR and CRP 05/26/17 07/01/17  Rai, Ripudeep K, MD  diphenhydrAMINE (BENADRYL) 12.5 MG/5ML elixir Take 5 mLs (12.5 mg total) by mouth every 6 (six) hours as needed for itching. Over the counter Patient taking differently: Take 12.5 mg by mouth every 6 (six) hours as needed for itching.  04/25/17   Rai, Vernelle Emerald, MD  doxazosin (CARDURA) 2 MG tablet Take 1 tablet (2 mg total) by mouth at bedtime. 04/25/17   Rai, Ripudeep K, MD  ferrous sulfate 325 (65 FE) MG EC tablet Take 1 tablet (325 mg total) by mouth 2 (two) times daily. Patient taking differently: Take 325 mg by mouth 2 (two) times daily with a meal.  07/04/16 07/04/17  Lavina Hamman, MD  finasteride  (PROSCAR) 5 MG tablet Take 5 mg by mouth at bedtime.     [provider]  fluticasone (FLONASE) 50 MCG/ACT nasal spray Place 1 spray into both nostrils 2 (two) times daily.     [provider]  glipiZIDE (GLUCOTROL) 5 MG tablet Take 5 mg by mouth daily before breakfast.    [provider]  loratadine (CLARITIN) 10 MG tablet Take 10 mg by mouth daily.    [provider]  Melatonin 5 MG TABS Take 5 mg by mouth at bedtime.    [provider]  metoprolol succinate (TOPROL-XL) 25 MG 24 hr tablet Take 0.5 tablets (12.5 mg total) by mouth 2 (two) times daily with a meal. 04/26/17   Rai, Ripudeep K, MD  Multiple Vitamins-Minerals (MULTIVITAMIN WITH MINERALS) tablet Take 1 tablet by mouth at bedtime.    [provider]  neomycin-bacitracin-polymyxin (NEOSPORIN) ointment Apply 1 application topically daily. Apply to left elbow and forearm daily for 10 days. Started 05/14/17.    [provider]  OXYGEN Inhale 2 L into the lungs continuous.    [provider]  pantoprazole (PROTONIX) 40 MG tablet Take 1 tablet (40 mg total) by mouth daily. 05/10/17   Aline August, MD  simvastatin (ZOCOR) 20 MG tablet Take 20 mg by mouth every evening.    [provider]  traMADol (ULTRAM) 50 MG tablet Take 1 tablet (50 mg total) by mouth every 12 (twelve) hours as needed for moderate pain. 05/26/17   Rai, Ripudeep K, MD  Umeclidinium-Vilanterol (ANORO ELLIPTA) 62.5-25 MCG/INH AEPB Inhale 1 puff into the lungs daily.    [provider]     Family History  Problem Relation Age of Onset  . Heart disease Mother   . Cancer Brother        colon  . Stroke Father     Social History   Socioeconomic History  . Marital status: Widowed    Spouse name: Not on file  . Number of children: 3  . Years of education: Not on file  . Highest education level: Not on file  Occupational History  . Occupation: Geographical information systems officer  . Financial resource  strain: Not on file  . Food insecurity:    Worry: Not on file    Inability: Not on file  . Transportation needs:    Medical: Not on file    Non-medical: Not on file  Tobacco Use  . Smoking status: Current Every Day Smoker    Packs/day: 1.50    Years: 75.00    Pack years: 112.50    Types: Cigarettes  . Smokeless tobacco: Never Used  Substance and Sexual Activity  . Alcohol use: No    Alcohol/week: 1.2 oz    Types: 2 Cans of beer per week  . Drug use: No  . Sexual activity: Never  Lifestyle  . Physical activity:    Days per week: Not on file    Minutes per session: Not on file  . Stress: Not on file  Relationships  . Social connections:    Talks on phone: Not on file    Gets together: Not on file    Attends religious service: Not on file    Active member of club or organization: Not on file    Attends meetings of clubs or organizations: Not on file    Relationship status: Not on file  Other Topics Concern  . Not on file  Social History Narrative  . Not on file    Review of Systems: A 12 point ROS discussed and pertinent positives are indicated in the HPI above.  All other systems are negative.  Review of Systems  Vital Signs: BP (!) 120/54   Pulse (!) 102   Temp 97.8 F (36.6 C) (Oral)   Resp 20   SpO2 99%   Physical Exam  Constitutional: He is oriented to person, place, and time. He appears well-developed. No distress.  HENT:  Head: Normocephalic.  Mouth/Throat: Oropharynx is clear and moist.  Neck: Normal range of motion. No JVD present. No tracheal deviation present.  Cardiovascular: Normal rate, regular rhythm and normal heart sounds.  Pulmonary/Chest: Effort normal and breath sounds normal. No respiratory distress.  Neurological: He is alert and oriented to person, place, and time.  Skin: Skin is dry.    Imaging: Ct Abdomen Pelvis Wo Contrast  Result Date: 05/20/2017 CLINICAL DATA:  Hematemesis.  Possible sepsis. EXAM: CT CHEST, ABDOMEN AND PELVIS  WITHOUT CONTRAST TECHNIQUE: Multidetector CT imaging of the chest, abdomen and pelvis was performed following the standard protocol without IV contrast. COMPARISON:  CT scan of Jun 29, 2011. FINDINGS: CT CHEST FINDINGS Cardiovascular: Atherosclerosis of thoracic aorta is noted without aneurysm formation. Left internal jugular catheter is noted with tip at cavoatrial junction. Normal cardiac size. No pericardial effusion. Status post aortic valve replacement. Mediastinum/Nodes: No enlarged mediastinal, hilar, or axillary lymph nodes. Thyroid gland, trachea, and esophagus demonstrate no significant findings. Lungs/Pleura: No pneumothorax is noted. Moderate right pleural effusion is noted with adjacent atelectasis or infiltrate in the right lower lobe. Minimal left pleural effusion is noted with adjacent subsegmental atelectasis. Musculoskeletal: No chest wall mass or suspicious bone lesions identified. CT ABDOMEN PELVIS FINDINGS Hepatobiliary: No focal liver abnormality is seen. No gallstones, gallbladder wall thickening, or biliary dilatation. Pancreas: No ductal dilatation is noted. 2.2 cm rounded low density is noted in pancreatic body which is slightly enlarged compared to prior exam. Spleen: Normal in size without focal abnormality. Adrenals/Urinary Tract: Adrenal glands are unremarkable. Stable left renal cyst. No hydronephrosis or renal obstruction is noted. No renal or ureteral calculi are noted. Urinary bladder is unremarkable. Stomach/Bowel: The stomach appears normal. There is no evidence of bowel obstruction or inflammation. Vascular/Lymphatic: Aortic atherosclerosis. No enlarged abdominal or pelvic lymph nodes. Reproductive: Prostate is unremarkable. Other: No abdominal wall hernia or abnormality. No abdominopelvic ascites. Musculoskeletal: No acute or significant osseous findings. IMPRESSION: 2.2 cm rounded density is noted in pancreatic body which is slightly enlarged compared to prior exam of 2013.  Recommend follow up pre and post contrast MRI/MRCP or pancreatic protocol CT in 2 years. This recommendation follows ACR consensus guidelines: Management of Incidental Pancreatic Cysts: A White Paper of the ACR Incidental Findings Committee. [place] 7494;49:[id number]. Moderate right pleural effusion is noted with adjacent atelectasis or infiltrate in the right lower lobe. Minimal left pleural effusion is noted with adjacent subsegmental atelectasis. Stable left renal cyst. Aortic Atherosclerosis (ICD10-I70.0). Electronically Signed   By: Marijo Conception, M.D.   On: 05/20/2017 17:27   Ct Chest Wo Contrast  Result Date: 05/20/2017 CLINICAL DATA:  Hematemesis.  Possible sepsis. EXAM: CT CHEST, ABDOMEN AND PELVIS WITHOUT CONTRAST TECHNIQUE: Multidetector CT imaging of the chest, abdomen and pelvis was performed following the standard protocol without IV contrast. COMPARISON:  CT scan of Jun 29, 2011. FINDINGS: CT CHEST FINDINGS Cardiovascular: Atherosclerosis of thoracic aorta is noted without aneurysm formation. Left internal jugular catheter is noted with tip at cavoatrial junction. Normal cardiac size. No pericardial effusion. Status post aortic valve replacement. Mediastinum/Nodes: No enlarged mediastinal, hilar, or axillary lymph nodes. Thyroid gland, trachea, and esophagus demonstrate no significant findings. Lungs/Pleura: No pneumothorax is noted. Moderate right pleural effusion is noted with adjacent atelectasis or infiltrate in the right lower lobe. Minimal left pleural effusion is noted with adjacent subsegmental atelectasis. Musculoskeletal: No chest wall mass or suspicious bone lesions identified. CT ABDOMEN PELVIS FINDINGS Hepatobiliary: No focal liver abnormality is seen. No gallstones, gallbladder wall thickening, or biliary dilatation. Pancreas: No ductal dilatation is noted. 2.2 cm rounded low density is noted in pancreatic body which is slightly enlarged compared to prior exam. Spleen: Normal  in size without focal abnormality. Adrenals/Urinary Tract: Adrenal glands are unremarkable. Stable left renal cyst. No hydronephrosis or renal obstruction is noted. No renal or ureteral calculi are noted. Urinary bladder is unremarkable. Stomach/Bowel:  The stomach appears normal. There is no evidence of bowel obstruction or inflammation. Vascular/Lymphatic: Aortic atherosclerosis. No enlarged abdominal or pelvic lymph nodes. Reproductive: Prostate is unremarkable. Other: No abdominal wall hernia or abnormality. No abdominopelvic ascites. Musculoskeletal: No acute or significant osseous findings. IMPRESSION: 2.2 cm rounded density is noted in pancreatic body which is slightly enlarged compared to prior exam of 2013. Recommend follow up pre and post contrast MRI/MRCP or pancreatic protocol CT in 2 years. This recommendation follows ACR consensus guidelines: Management of Incidental Pancreatic Cysts: A White Paper of the ACR Incidental Findings Committee. [place] 1517;61:[id number]. Moderate right pleural effusion is noted with adjacent atelectasis or infiltrate in the right lower lobe. Minimal left pleural effusion is noted with adjacent subsegmental atelectasis. Stable left renal cyst. Aortic Atherosclerosis (ICD10-I70.0). Electronically Signed   By: Marijo Conception, M.D.   On: 05/20/2017 17:27   Dg Chest Portable 1 View  Result Date: 05/20/2017 CLINICAL DATA:  Suspicion of sepsis. EXAM: PORTABLE CHEST 1 VIEW COMPARISON:  Portable chest x-ray of April 19, 2017 FINDINGS: The lungs are well-expanded. The interstitial markings remain increased bilaterally greatest on the right. The cardiac silhouette remains mildly enlarged. The pulmonary vascularity is engorged. The dual-lumen dialysis catheter tip projects at the cavoatrial junction. The sternal wires and left atrial appendage clip are stable. IMPRESSION: CHF with mild interstitial edema. One cannot exclude basilar pneumonia on the right. Electronically  Signed   By: David  Martinique M.D.   On: 05/20/2017 15:05    Labs:  CBC: Recent Labs    05/21/17 0403 05/21/17 1419 05/23/17 1240 05/26/17 0949  WBC 13.9* 20.1* 10.4 8.0  HGB 8.4* 9.6* 8.1* 7.3*  HCT 27.0* 30.8* 25.8* 23.1*  PLT 70* 74* 54* 105*    COAGS: Recent Labs    05/09/17 0410 05/10/17 0408 05/20/17 1352 05/21/17 0403  INR 1.51 1.33 1.37 1.47    BMP: Recent Labs    05/20/17 1352 05/20/17 1359 05/21/17 0403 05/23/17 1240 05/26/17 0930  NA 137 138 136 134* 133*  K 4.0 4.0 3.9 4.2 4.6  CL 99* 97* 99* 99* 97*  CO2 25  --  '23 25 26  '$ GLUCOSE 62* 60* 144* 189* 234*  BUN 28* 31* 37* 49* 36*  CALCIUM 8.0*  --  7.5* 8.6* 8.4*  CREATININE 3.94* 4.00* 4.42* 4.72* 4.15*  GFRNONAA 12*  --  10* 10* 11*  GFRAA 14*  --  12* 11* 13*    LIVER FUNCTION TESTS: Recent Labs    04/15/17 0427  05/05/17 1400 05/06/17 0224 05/07/17 0738 05/20/17 1352 05/23/17 1240 05/26/17 0930  BILITOT 0.5  --  0.5 0.7  --  1.2  --   --   AST 15  --  23 18  --  43*  --   --   ALT 11*  --  13* 11*  --  18  --   --   ALKPHOS 74  --  86 74  --  79  --   --   PROT 5.6*  --  7.0 6.4*  --  6.5  --   --   ALBUMIN 2.6*   < > 2.9* 2.7* 2.4* 2.5* 2.0* 1.9*   < > = values in this interval not displayed.    TUMOR MARKERS: No results for input(s): AFPTM, CEA, CA199, CHROMGRNA in the last 8760 hours.  Assessment and Plan: ESRD Thrombosed (L)UE AVG, in need of HD access. Plan for tunneled HD cath placement Labs pending  Risks and benefits of image guided permcath placement was discussed with the patient including, but not limited to bleeding, infection, pneumothorax, or fibrin sheath development and need for additional procedures.  All of the patient's questions were answered, patient is agreeable to proceed. Consent signed and in chart.    Thank you for this interesting consult.  I greatly enjoyed meeting KIVON APREA and look forward to participating in their care.  A copy of this  report was sent to the requesting provider on this date.  Electronically Signed: Ascencion Dike, PA-C 06/11/2017, 2:12 PM   I spent a total of 20 minutes in face to face in clinical consultation, greater than 50% of which was counseling/coordinating care for tunneled HD catheter

## 2017-06-11 NOTE — Patient Outreach (Addendum)
[facility]) Care Management  06/11/2017  Ronald [name] 10/30/1922 [id number]    Assessment: 84 year old admitted 2/14-3/2 with heart failure; new hemodialysis patient. History of heart failure, COPD,atrial fibrillation; diabetes; lower Gastro-Intestinal bleed. Client admitted 05/20/17-05/26/17 with Sepsis/Bacteremia. Discharged 4/1 to [facility] for rehabilitation. Client discharged back to his home with 24/7 assistance on 4/16.  RNCM called and spoke with client's son Ronald [name], health care power of attorney who states that client is having a procedure today to have dialysis catheter place into his chest because of graft not working. Client son reports he is out of town right now and his sister Ronald [name] is with client.   Plan: RNCM will follow up by the end of the work to schedule home visit.    [name], RN, MSN, [place] Coordinator Cell: [phone]

## 2017-06-11 NOTE — Procedures (Signed)
  Procedure: R IJ tunneled HD catheter  23 Palindrome EBL:   minimal Complications:  none immediate  See full dictation in [facility].  Dillard Cannon MD Main # [phone] Pager  [phone]

## 2017-06-11 NOTE — Sedation Documentation (Signed)
Patient denies pain and is resting comfortably.  

## 2017-06-11 NOTE — Discharge Instructions (Signed)
[employer], Adult A central line is a thin, flexible tube (catheter) that is put in your vein. It can be used to:  Take blood for lab tests.  Give you medicine.  Give you food and nutrients.  The procedure may vary among doctors and hospitals. Follow these instructions at home: Caring for the tube  Follow instructions from your doctor about: ? Flushing the tube with saline solution. ? Cleaning the tube and the area around it.  Only flush with clean (sterile) supplies. The supplies should be from your doctor, a pharmacy, or another place that your doctor recommends.  Before you flush the tube or clean the area around the tube: ? Wash your hands with soap and water. If you cannot use soap and water, use hand sanitizer. ? Clean the central line hub with rubbing alcohol. Caring for your skin  Keep the area where the tube was put in clean and dry.  Every day, and when changing the bandage, check the skin around the central line for: ? Redness, swelling, or pain. ? Fluid or blood. ? Warmth. ? Pus. ? A bad smell. General instructions  Keep the tube clamped, unless it is being used.  Keep your supplies in a clean, dry location.  If you or someone else accidentally pulls on the tube, make sure: ? The bandage (dressing) is okay. ? There is no bleeding. ? The tube has not been pulled out.  Do not use scissors or sharp objects near the tube.  Do not swim or let the tube soak in a tub.  Ask your doctor what activities are safe for you. Your doctor may tell you not to lift anything or move your arm too much.  Take over-the-counter and prescription medicines only as told by your doctor.  Change bandages as told by your doctor.  Keep your bandage dry. If a bandage gets wet, have it changed right away.  Keep all follow-up visits as told by your doctor. This is important. Throwing away supplies  Throw away any syringes in a trash (disposal) container that is only for sharp  items (sharps container). You can buy a sharps container from a pharmacy, or you can make one by using an empty hard plastic bottle with a cover.  Place any used bandages or infusion bags into a plastic bag. Throw that bag in the trash. Contact a doctor if:  You have any of these where the tube was put in: ? Redness, swelling, or pain. ? Fluid or blood. ? A warm feeling. ? Pus or a bad smell. Get help right away if:  You have: ? A fever. ? Chills. ? Trouble getting enough air (shortness of breath). ? Trouble breathing. ? Pain in your chest. ? Swelling in your neck, face, chest, or arm.  You are coughing.  You feel your heart beating fast or skipping beats.  You feel dizzy or you pass out (faint).  There are red lines coming from where the tube was put in.  The area where the tube was put in is bleeding and the bleeding will not stop.  Your tube is hard to flush.  You do not get a blood return from the tube.  The tube gets loose or comes out.  The tube has a hole or a tear.  The tube leaks. Summary  A central line is a thin, flexible tube (catheter) that is put in your vein. It can be used to take blood for lab tests or to  give you medicine.  Follow instructions from your doctor about flushing and cleaning the tube.  Keep the area where the tube was put in clean and dry.  Ask your doctor what activities are safe for you. This information is not intended to replace advice given to you by your health care provider. Make sure you discuss any questions you have with your health care provider. Document Released: 01/29/2012 Document Revised: 02/29/2016 Document Reviewed: 02/29/2016 Elsevier Interactive Patient Education  2017 [place]. Moderate Conscious Sedation, Adult, Care After These instructions provide you with information about caring for yourself after your procedure. Your health care provider may also give you more specific instructions. Your treatment has  been planned according to current medical practices, but problems sometimes occur. Call your health care provider if you have any problems or questions after your procedure. What can I expect after the procedure? After your procedure, it is common:  To feel sleepy for several hours.  To feel clumsy and have poor balance for several hours.  To have poor judgment for several hours.  To vomit if you eat too soon.  Follow these instructions at home: For at least 24 hours after the procedure:   Do not: ? Participate in activities where you could fall or become injured. ? Drive. ? Use heavy machinery. ? Drink alcohol. ? Take sleeping pills or medicines that cause drowsiness. ? Make important decisions or sign legal documents. ? Take care of children on your own.  Rest. Eating and drinking  Follow the diet recommended by your health care provider.  If you vomit: ? Drink water, juice, or soup when you can drink without vomiting. ? Make sure you have little or no nausea before eating solid foods. General instructions  Have a responsible adult stay with you until you are awake and alert.  Take over-the-counter and prescription medicines only as told by your health care provider.  If you smoke, do not smoke without supervision.  Keep all follow-up visits as told by your health care provider. This is important. Contact a health care provider if:  You keep feeling nauseous or you keep vomiting.  You feel light-headed.  You develop a rash.  You have a fever. Get help right away if:  You have trouble breathing. This information is not intended to replace advice given to you by your health care provider. Make sure you discuss any questions you have with your health care provider. Document Released: 12/02/2012 Document Revised: 07/17/2015 Document Reviewed: 06/03/2015 Elsevier Interactive Patient Education  [employer].

## 2017-06-15 DIAGNOSIS — R0602 Shortness of breath: Secondary | ICD-10-CM | POA: Diagnosis not present

## 2017-06-15 DIAGNOSIS — S52591A Other fractures of lower end of right radius, initial encounter for closed fracture: Secondary | ICD-10-CM | POA: Diagnosis not present

## 2017-06-15 DIAGNOSIS — J449 Chronic obstructive pulmonary disease, unspecified: Secondary | ICD-10-CM | POA: Diagnosis not present

## 2017-06-15 DIAGNOSIS — M84359S Stress fracture, hip, unspecified, sequela: Secondary | ICD-10-CM | POA: Diagnosis not present

## 2017-06-15 DIAGNOSIS — R296 Repeated falls: Secondary | ICD-10-CM | POA: Diagnosis not present

## 2017-06-15 DIAGNOSIS — I4891 Unspecified atrial fibrillation: Secondary | ICD-10-CM | POA: Diagnosis not present

## 2017-06-15 DIAGNOSIS — M810 Age-related osteoporosis without current pathological fracture: Secondary | ICD-10-CM | POA: Diagnosis not present

## 2017-06-15 DIAGNOSIS — S42122D Displaced fracture of acromial process, left shoulder, subsequent encounter for fracture with routine healing: Secondary | ICD-10-CM | POA: Diagnosis not present

## 2017-06-15 DIAGNOSIS — I5032 Chronic diastolic (congestive) heart failure: Secondary | ICD-10-CM | POA: Diagnosis not present

## 2017-06-15 DIAGNOSIS — R269 Unspecified abnormalities of gait and mobility: Secondary | ICD-10-CM | POA: Diagnosis not present

## 2017-06-16 ENCOUNTER — Other Ambulatory Visit: Payer: Self-pay | Admitting: Licensed Clinical Social Worker

## 2017-06-16 NOTE — Patient Outreach (Signed)
[facility] [facility]) Care Management  06/16/2017  Ronald [name] 1922-12-27 [id number]  Assessment- [facility] CSW received referral from [name]. Patient recent;y discharged back home from [facility] and son requested social work assistance. [facility] CSW completed initial outreach call to patient's HCPOA and son on 06/16/17 and was able to successfully reach him. HIPPA verifications provided. [facility] CSW introduced self, reason for call and of [facility] social work services. Patient is a 84 years old patient with a history of health failure, COPD, atrial fibrillation and diabetes. Patient discharged back home from [facility] on 06/10/17 with 24/7 care with [facility]. However, [name] shares that patient called the police on one of the caregivers 3 different times this past weekend. [name] states that [facility] is going out to visit patient today to see if they can get him a new aide that he feels comfortable with. [name] admits that if patient is unable to work with caregivers, then family will need to look into LTC placement for patient. However, patient wishes to stay home and declines LTC placement and family is doing their best to support this decision. [name] shares that he is out of town again this week but that his sisters are both involved and that [name] can contact them this week if needed. [name] requests that [name] CSW follow up next week to see how things are going as [name] admits he is unsure if they need social work assistance as it depends on whether or not 24/7 home care services is able to be successful or not. Son appreciative of phone call and is agreeable to phone call next week to assess social work needs.   Plan-[facility] CSW will send involvement letter to PCP. [facility] CSW will follow up within one week.  [name], BSW, MSW, [name] Phone: [phone] Fax: [phone]

## 2017-06-17 ENCOUNTER — Ambulatory Visit: Payer: PPO | Admitting: Nurse Practitioner

## 2017-06-18 ENCOUNTER — Other Ambulatory Visit: Payer: Self-pay | Admitting: Licensed Clinical Social Worker

## 2017-06-18 NOTE — Patient Outreach (Signed)
[place] [facility]) Care Management  06/18/2017  Ronald [name] October 14, 1922 [id number]  [facility] CSW completed outreach call to patient's caregiver and daughter [name] and was able to reach her successfully. [facility] CSW introduced self, reason for call and of [facility] social work services. HIPPA verifications received successfully. She shares that she feels that patient is making more of an effort to get along with his aides as he is aware he will have to be placed at nursing facility if this does not work out. [name] shares that they met with [facility] and that patient was assigned a new aide which is a male and patient seems to get along with him well. However, the male aide is out of town this week and therefore patient has had several different aides this past week and patient has been able to adjust to this schedule without issues. [name] reports that patient is doing "really good." [facility] CSW will follow up with family within two weeks to reassess social work needs as family still remains unsure if they need [place] [employer] involvement.   [name], BSW, MSW, [name] Phone: [phone] Fax: [phone]

## 2017-06-19 ENCOUNTER — Other Ambulatory Visit: Payer: Self-pay

## 2017-06-19 NOTE — Patient Outreach (Signed)
[place] [facility]) Care Management  06/19/2017  Ronald [name] 1922/03/24 [id number]  Late entry for 06/12/17 0900  Multidisciplinary discussion completed. RNCM will continue to follow regarding transition of care.  [name], RN, MSN, [place] Coordinator Cell: [phone]

## 2017-06-19 NOTE — Patient Outreach (Signed)
[place] [facility]) Care Management  06/19/2017  BRANDI ARMATO [birth year] [id number]   84 year old admitted 2/14-3/2 with heart failure; new hemodialysis patient. History of heart failure, COPD,atrial fibrillation; diabetes; lower Gastro-Intestinal bleed.Client admitted 05/20/17-05/26/17 with Sepsis/Bacteremia. Discharged 05/26/17 to [facility] for rehabilitation. Client discharged back to his home with 24/7 assistance on 06/10/17.  RNCM called to follow up. Attempted to call Delora Fuel, son, voice message left. Than RNCM called Daughter [name] Sitton(on consent). She was unable to talk and will call RNCM back.  [name] called back and reports client is doing well. She states the personal care service is in place. She reports client continues with dialysis three days and week.  [name] reports [facility] [place] came to see client and questioned if [facility] was in Network with Payor.   Plan: RNCM will contact payor concierge and follow up with [name]. Home visit scheduled for next week.  [name], RN, MSN, [place] Coordinator Cell: [phone]

## 2017-06-20 ENCOUNTER — Other Ambulatory Visit: Payer: Self-pay

## 2017-06-20 NOTE — Patient Outreach (Signed)
[facility]) Care Management  06/20/2017  ROSAIRE CUETO 1922-04-15 [id number]   Care Coordination: RNCM called [employer] P[employer] Concierge and confirmed that [facility] home health is in the network. RNCM also called and informed client's daughter [name] who thanked [facility] for checking. Ms Hassie Bruce reports she will also call [facility] to confirm the co-pay. [name] to call RNCM if she has any other questions/issues.  Plan: continue to follow. Home visit scheduled for next week.  [name], RN, MSN, [place] Coordinator Cell: [phone]

## 2017-06-24 DIAGNOSIS — N186 End stage renal disease: Secondary | ICD-10-CM | POA: Diagnosis not present

## 2017-06-24 DIAGNOSIS — Z992 Dependence on renal dialysis: Secondary | ICD-10-CM | POA: Diagnosis not present

## 2017-06-24 DIAGNOSIS — E1122 Type 2 diabetes mellitus with diabetic chronic kidney disease: Secondary | ICD-10-CM | POA: Diagnosis not present

## 2017-06-25 DIAGNOSIS — R7881 Bacteremia: Secondary | ICD-10-CM | POA: Diagnosis not present

## 2017-06-25 DIAGNOSIS — D689 Coagulation defect, unspecified: Secondary | ICD-10-CM | POA: Diagnosis not present

## 2017-06-25 DIAGNOSIS — Z23 Encounter for immunization: Secondary | ICD-10-CM | POA: Diagnosis not present

## 2017-06-25 DIAGNOSIS — N186 End stage renal disease: Secondary | ICD-10-CM | POA: Diagnosis not present

## 2017-06-25 DIAGNOSIS — D649 Anemia, unspecified: Secondary | ICD-10-CM | POA: Diagnosis not present

## 2017-06-26 ENCOUNTER — Ambulatory Visit: Payer: Self-pay

## 2017-06-26 ENCOUNTER — Other Ambulatory Visit: Payer: Self-pay

## 2017-06-26 DIAGNOSIS — I10 Essential (primary) hypertension: Secondary | ICD-10-CM | POA: Diagnosis not present

## 2017-06-26 DIAGNOSIS — L89159 Pressure ulcer of sacral region, unspecified stage: Secondary | ICD-10-CM | POA: Diagnosis not present

## 2017-06-26 DIAGNOSIS — Z992 Dependence on renal dialysis: Secondary | ICD-10-CM | POA: Diagnosis not present

## 2017-06-26 DIAGNOSIS — M545 Low back pain: Secondary | ICD-10-CM | POA: Diagnosis not present

## 2017-06-26 DIAGNOSIS — I509 Heart failure, unspecified: Secondary | ICD-10-CM | POA: Diagnosis not present

## 2017-06-26 DIAGNOSIS — R2689 Other abnormalities of gait and mobility: Secondary | ICD-10-CM | POA: Diagnosis not present

## 2017-06-26 DIAGNOSIS — T1490XA Injury, unspecified, initial encounter: Secondary | ICD-10-CM | POA: Diagnosis not present

## 2017-06-26 DIAGNOSIS — E1151 Type 2 diabetes mellitus with diabetic peripheral angiopathy without gangrene: Secondary | ICD-10-CM | POA: Diagnosis not present

## 2017-06-26 DIAGNOSIS — Z6821 Body mass index (BMI) 21.0-21.9, adult: Secondary | ICD-10-CM | POA: Diagnosis not present

## 2017-06-26 DIAGNOSIS — I130 Hypertensive heart and chronic kidney disease with heart failure and stage 1 through stage 4 chronic kidney disease, or unspecified chronic kidney disease: Secondary | ICD-10-CM | POA: Diagnosis not present

## 2017-06-26 DIAGNOSIS — J449 Chronic obstructive pulmonary disease, unspecified: Secondary | ICD-10-CM | POA: Diagnosis not present

## 2017-06-26 DIAGNOSIS — I4891 Unspecified atrial fibrillation: Secondary | ICD-10-CM | POA: Diagnosis not present

## 2017-06-26 NOTE — Patient Outreach (Signed)
[facility]) Care Management  06/26/2017  Jadien [facility] [birth year] [id number]   RNCM returned call to Bevely Palmer, client's daughter. Ms. Hassie Bruce states that client has been going to the office on the days he is not in Dialysis. She also reports client had a MD appointment today.  [name] states client is doing well. Denies any questions or concerns. [name] request RNCM continue with transition of care program telephonically.  Plan: telephonic outreach next week.   [name], RN, MSN, [place] Coordinator Cell: [phone]

## 2017-06-29 DIAGNOSIS — R269 Unspecified abnormalities of gait and mobility: Secondary | ICD-10-CM | POA: Diagnosis not present

## 2017-06-29 DIAGNOSIS — I4891 Unspecified atrial fibrillation: Secondary | ICD-10-CM | POA: Diagnosis not present

## 2017-06-29 DIAGNOSIS — M810 Age-related osteoporosis without current pathological fracture: Secondary | ICD-10-CM | POA: Diagnosis not present

## 2017-06-29 DIAGNOSIS — J449 Chronic obstructive pulmonary disease, unspecified: Secondary | ICD-10-CM | POA: Diagnosis not present

## 2017-06-29 DIAGNOSIS — R296 Repeated falls: Secondary | ICD-10-CM | POA: Diagnosis not present

## 2017-06-29 DIAGNOSIS — S42122D Displaced fracture of acromial process, left shoulder, subsequent encounter for fracture with routine healing: Secondary | ICD-10-CM | POA: Diagnosis not present

## 2017-06-29 DIAGNOSIS — M84359S Stress fracture, hip, unspecified, sequela: Secondary | ICD-10-CM | POA: Diagnosis not present

## 2017-06-29 DIAGNOSIS — R0602 Shortness of breath: Secondary | ICD-10-CM | POA: Diagnosis not present

## 2017-06-29 DIAGNOSIS — S52591A Other fractures of lower end of right radius, initial encounter for closed fracture: Secondary | ICD-10-CM | POA: Diagnosis not present

## 2017-06-29 DIAGNOSIS — I5032 Chronic diastolic (congestive) heart failure: Secondary | ICD-10-CM | POA: Diagnosis not present

## 2017-06-30 ENCOUNTER — Other Ambulatory Visit: Payer: Self-pay | Admitting: Internal Medicine

## 2017-06-30 ENCOUNTER — Ambulatory Visit
Admission: RE | Admit: 2017-06-30 | Discharge: 2017-06-30 | Disposition: A | Discharge status: Home / Self Care | Payer: PPO | Source: Ambulatory Visit | Attending: Internal Medicine | Admitting: Internal Medicine

## 2017-06-30 DIAGNOSIS — M545 Low back pain, unspecified: Secondary | ICD-10-CM

## 2017-07-01 DIAGNOSIS — J449 Chronic obstructive pulmonary disease, unspecified: Secondary | ICD-10-CM | POA: Diagnosis not present

## 2017-07-01 DIAGNOSIS — I700 Atherosclerosis of aorta: Secondary | ICD-10-CM | POA: Diagnosis not present

## 2017-07-01 DIAGNOSIS — I701 Atherosclerosis of renal artery: Secondary | ICD-10-CM | POA: Diagnosis not present

## 2017-07-01 DIAGNOSIS — I770 Arteriovenous fistula, acquired: Secondary | ICD-10-CM | POA: Diagnosis not present

## 2017-07-01 DIAGNOSIS — I5030 Unspecified diastolic (congestive) heart failure: Secondary | ICD-10-CM | POA: Diagnosis not present

## 2017-07-01 DIAGNOSIS — R627 Adult failure to thrive: Secondary | ICD-10-CM | POA: Diagnosis not present

## 2017-07-01 DIAGNOSIS — I482 Chronic atrial fibrillation: Secondary | ICD-10-CM | POA: Diagnosis not present

## 2017-07-01 DIAGNOSIS — E1122 Type 2 diabetes mellitus with diabetic chronic kidney disease: Secondary | ICD-10-CM | POA: Diagnosis not present

## 2017-07-01 DIAGNOSIS — I2510 Atherosclerotic heart disease of native coronary artery without angina pectoris: Secondary | ICD-10-CM | POA: Diagnosis not present

## 2017-07-01 DIAGNOSIS — R2689 Other abnormalities of gait and mobility: Secondary | ICD-10-CM | POA: Diagnosis not present

## 2017-07-01 DIAGNOSIS — E43 Unspecified severe protein-calorie malnutrition: Secondary | ICD-10-CM | POA: Diagnosis not present

## 2017-07-01 DIAGNOSIS — I872 Venous insufficiency (chronic) (peripheral): Secondary | ICD-10-CM | POA: Diagnosis not present

## 2017-07-01 DIAGNOSIS — F17210 Nicotine dependence, cigarettes, uncomplicated: Secondary | ICD-10-CM | POA: Diagnosis not present

## 2017-07-01 DIAGNOSIS — N186 End stage renal disease: Secondary | ICD-10-CM | POA: Diagnosis not present

## 2017-07-01 DIAGNOSIS — I132 Hypertensive heart and chronic kidney disease with heart failure and with stage 5 chronic kidney disease, or end stage renal disease: Secondary | ICD-10-CM | POA: Diagnosis not present

## 2017-07-01 DIAGNOSIS — E785 Hyperlipidemia, unspecified: Secondary | ICD-10-CM | POA: Diagnosis not present

## 2017-07-01 DIAGNOSIS — R911 Solitary pulmonary nodule: Secondary | ICD-10-CM | POA: Diagnosis not present

## 2017-07-01 DIAGNOSIS — E1151 Type 2 diabetes mellitus with diabetic peripheral angiopathy without gangrene: Secondary | ICD-10-CM | POA: Diagnosis not present

## 2017-07-01 DIAGNOSIS — I2789 Other specified pulmonary heart diseases: Secondary | ICD-10-CM | POA: Diagnosis not present

## 2017-07-01 DIAGNOSIS — E1142 Type 2 diabetes mellitus with diabetic polyneuropathy: Secondary | ICD-10-CM | POA: Diagnosis not present

## 2017-07-01 DIAGNOSIS — I272 Pulmonary hypertension, unspecified: Secondary | ICD-10-CM | POA: Diagnosis not present

## 2017-07-01 DIAGNOSIS — D631 Anemia in chronic kidney disease: Secondary | ICD-10-CM | POA: Diagnosis not present

## 2017-07-01 DIAGNOSIS — M810 Age-related osteoporosis without current pathological fracture: Secondary | ICD-10-CM | POA: Diagnosis not present

## 2017-07-01 DIAGNOSIS — I6522 Occlusion and stenosis of left carotid artery: Secondary | ICD-10-CM | POA: Diagnosis not present

## 2017-07-01 DIAGNOSIS — M1990 Unspecified osteoarthritis, unspecified site: Secondary | ICD-10-CM | POA: Diagnosis not present

## 2017-07-01 DIAGNOSIS — Z681 Body mass index (BMI) 19 or less, adult: Secondary | ICD-10-CM | POA: Diagnosis not present

## 2017-07-03 ENCOUNTER — Other Ambulatory Visit: Payer: Self-pay | Admitting: Licensed Clinical Social Worker

## 2017-07-03 ENCOUNTER — Inpatient Hospital Stay: Payer: PPO | Admitting: Internal Medicine

## 2017-07-03 ENCOUNTER — Other Ambulatory Visit: Payer: Self-pay

## 2017-07-03 NOTE — Patient Outreach (Signed)
[name]) Care Management  07/03/2017  Ronald [name] 05-Jan-1923 [id number]   Assessment- [facility] CSW completed outreach call to patient's daughter [name] to reassess social work needs. Daughter answered and provided HIPPA verifications. [facility] CSW questioned how things were going with patient's care. Daughter reports that they are very satisfied with patient's current aide through [name] who is a live-in aide and will be there with him 24/7. Daughter reports that this is going well. However, patient is extremely weak, in a lot of pain and wishes to discontinue dialysis as he does not have the energy to go to [name]. Daughter is wanting to know if patient can get home dialysis instead. Daughter is also wanting to know if patient can receive palliative care services to help him become more comfortable. Daughter also shares that she wishes for patient to gain a wound care nurse through [facility]. Daughter denies any further social work needs now that patient has a 24/7 live-in aide but admits to ongoing nursing needs. [facility] CSW will notify [name] RNCM of daughter's wishes. [facility] will sign off at this time as family denies any further social work needs.  Plan-[facility] CSW will sign off at this time. [facility] CSW will send update to PCP as well.  [name], BSW, MSW, [email or web address] Phone: [phone] Fax: [phone]

## 2017-07-03 NOTE — Patient Outreach (Signed)
[place] [facility]) Care Management  07/03/2017  Ronald [name] [facility] 06-Nov-1922 [id number]   RNCM called to follow up regarding transition of care. RNCM called client's daughter, Ronald [name]. No answer. HIPPA compliant message left.  Plan: await return call and follow up tomorrow.  [name], RN, MSN, [place] Coordinator Cell: [phone]

## 2017-07-04 ENCOUNTER — Other Ambulatory Visit: Payer: Self-pay

## 2017-07-04 NOTE — Patient Outreach (Signed)
[place] [facility]) Care Management  07/04/2017  Ronald [name] 1923-02-04 [id number]  84 year old admitted 2/14-3/2 with heart failure; new hemodialysis patient. History of heart failure, COPD,atrial fibrillation; diabetes; lower Gastro-Intestinal bleed.  Client admitted 05/20/17-05/26/17 with Sepsis/Bacteremia.Discharged 05/26/17 to [facility] for rehabilitation. Client discharged back to his home with 24/7 assistance on 06/10/17.  RNCM returned call to client's daugher Ronald [name]. She reports client missed dialysis on Wednesday because of pain to his back. She states client has compression fracture. She reports she called and spoke with Palliative care at [facility] on yesterday because she wants someone to come out to the house to also take care of client's wounds. She states primary care is aware of his toe wound, but is not clear if the primary care has seen his other areas that report is due to poor skin turgor. Client continues to have around the clock personal care service. Ronald [name] is also unclear if advanced home health is active for nursing. She reports the physical therapist is not seeing client due to his pain.  She reports client is taking pain medication, but is only taking once a day. She states they are going to start today giving it to him pain medication twice a day.  Plan: care coordination with primary care office and home health agency.   Ronald Silversmith, RN, MSN, [place] Coordinator Cell: [phone]

## 2017-07-04 NOTE — Patient Outreach (Signed)
[facility]) Care Management  07/04/2017  Ronald [name] [birth year] [id number]   Care Coordination: RNCM spoke with Dr. Keane Police nurse, Francia Greaves [facility] updated her that client missed dialysis on Wednesday per [name] due to pain. RNCM informed her of [name]'s concerns re: pain management and client's wounds. Caryl Pina states that Dr. [name] has seen client recently and dressed all his wounds. She reports that she has spoken with client's other daughter, [name] regarding the above and will follow up with [name] manages client's medication). RNCM also provided Dr. Keane Police nurse client's home health nurse's name and contact number Allen Kell [phone].Marland Kitchen   Plan: RNCM will continue to follow. Care coordination with home health nurse.[name], RN, MSN, [place] Coordinator Cell: [phone]

## 2017-07-04 NOTE — Patient Outreach (Signed)
[facility]) Care Management  07/04/2017  Ronald [name] 1922/04/01 [id number]   Care Coordination: RNCM spoke with client's home health nurse, Allen Kell. She was update regarding client concerns: missing dialysis appointment on Wednesday; pain management;  wound care. She reports she is scheduled to see client on tomorrow. She thanked [facility] for the update.  Plan: continue to follow.  [name], RN, MSN, [place] Coordinator Cell: [phone]

## 2017-07-06 DIAGNOSIS — R410 Disorientation, unspecified: Secondary | ICD-10-CM | POA: Diagnosis not present

## 2017-07-06 DIAGNOSIS — S51801A Unspecified open wound of right forearm, initial encounter: Secondary | ICD-10-CM | POA: Diagnosis not present

## 2017-07-06 DIAGNOSIS — E119 Type 2 diabetes mellitus without complications: Secondary | ICD-10-CM | POA: Diagnosis not present

## 2017-07-06 DIAGNOSIS — N3000 Acute cystitis without hematuria: Secondary | ICD-10-CM | POA: Diagnosis not present

## 2017-07-06 DIAGNOSIS — D631 Anemia in chronic kidney disease: Secondary | ICD-10-CM | POA: Diagnosis not present

## 2017-07-06 DIAGNOSIS — I120 Hypertensive chronic kidney disease with stage 5 chronic kidney disease or end stage renal disease: Secondary | ICD-10-CM | POA: Diagnosis not present

## 2017-07-06 DIAGNOSIS — L89899 Pressure ulcer of other site, unspecified stage: Secondary | ICD-10-CM | POA: Diagnosis not present

## 2017-07-06 DIAGNOSIS — I272 Pulmonary hypertension, unspecified: Secondary | ICD-10-CM | POA: Diagnosis not present

## 2017-07-06 DIAGNOSIS — G92 Toxic encephalopathy: Secondary | ICD-10-CM | POA: Diagnosis not present

## 2017-07-06 DIAGNOSIS — S52591A Other fractures of lower end of right radius, initial encounter for closed fracture: Secondary | ICD-10-CM | POA: Diagnosis not present

## 2017-07-06 DIAGNOSIS — L89152 Pressure ulcer of sacral region, stage 2: Secondary | ICD-10-CM | POA: Diagnosis not present

## 2017-07-06 DIAGNOSIS — E1151 Type 2 diabetes mellitus with diabetic peripheral angiopathy without gangrene: Secondary | ICD-10-CM | POA: Diagnosis not present

## 2017-07-06 DIAGNOSIS — R5381 Other malaise: Secondary | ICD-10-CM | POA: Diagnosis not present

## 2017-07-06 DIAGNOSIS — F419 Anxiety disorder, unspecified: Secondary | ICD-10-CM | POA: Diagnosis not present

## 2017-07-06 DIAGNOSIS — F329 Major depressive disorder, single episode, unspecified: Secondary | ICD-10-CM | POA: Diagnosis not present

## 2017-07-06 DIAGNOSIS — N186 End stage renal disease: Secondary | ICD-10-CM | POA: Diagnosis not present

## 2017-07-06 DIAGNOSIS — R296 Repeated falls: Secondary | ICD-10-CM | POA: Diagnosis not present

## 2017-07-06 DIAGNOSIS — R402142 Coma scale, eyes open, spontaneous, at arrival to emergency department: Secondary | ICD-10-CM | POA: Diagnosis not present

## 2017-07-06 DIAGNOSIS — J449 Chronic obstructive pulmonary disease, unspecified: Secondary | ICD-10-CM | POA: Diagnosis not present

## 2017-07-06 DIAGNOSIS — I872 Venous insufficiency (chronic) (peripheral): Secondary | ICD-10-CM | POA: Diagnosis not present

## 2017-07-06 DIAGNOSIS — M5489 Other dorsalgia: Secondary | ICD-10-CM | POA: Diagnosis not present

## 2017-07-06 DIAGNOSIS — I4891 Unspecified atrial fibrillation: Secondary | ICD-10-CM | POA: Diagnosis not present

## 2017-07-06 DIAGNOSIS — R627 Adult failure to thrive: Secondary | ICD-10-CM | POA: Diagnosis not present

## 2017-07-06 DIAGNOSIS — S51012A Laceration without foreign body of left elbow, initial encounter: Secondary | ICD-10-CM | POA: Diagnosis not present

## 2017-07-06 DIAGNOSIS — R9431 Abnormal electrocardiogram [ECG] [EKG]: Secondary | ICD-10-CM | POA: Diagnosis not present

## 2017-07-06 DIAGNOSIS — D649 Anemia, unspecified: Secondary | ICD-10-CM | POA: Diagnosis not present

## 2017-07-06 DIAGNOSIS — R2232 Localized swelling, mass and lump, left upper limb: Secondary | ICD-10-CM | POA: Diagnosis not present

## 2017-07-06 DIAGNOSIS — R2689 Other abnormalities of gait and mobility: Secondary | ICD-10-CM | POA: Diagnosis not present

## 2017-07-06 DIAGNOSIS — G9340 Encephalopathy, unspecified: Secondary | ICD-10-CM | POA: Diagnosis not present

## 2017-07-06 DIAGNOSIS — H9113 Presbycusis, bilateral: Secondary | ICD-10-CM | POA: Diagnosis not present

## 2017-07-06 DIAGNOSIS — Z951 Presence of aortocoronary bypass graft: Secondary | ICD-10-CM | POA: Diagnosis not present

## 2017-07-06 DIAGNOSIS — I6522 Occlusion and stenosis of left carotid artery: Secondary | ICD-10-CM | POA: Diagnosis not present

## 2017-07-06 DIAGNOSIS — R402232 Coma scale, best verbal response, inappropriate words, at arrival to emergency department: Secondary | ICD-10-CM | POA: Diagnosis not present

## 2017-07-06 DIAGNOSIS — N390 Urinary tract infection, site not specified: Secondary | ICD-10-CM | POA: Diagnosis not present

## 2017-07-06 DIAGNOSIS — R402352 Coma scale, best motor response, localizes pain, at arrival to emergency department: Secondary | ICD-10-CM | POA: Diagnosis not present

## 2017-07-06 DIAGNOSIS — J90 Pleural effusion, not elsewhere classified: Secondary | ICD-10-CM | POA: Diagnosis not present

## 2017-07-06 DIAGNOSIS — M4856XD Collapsed vertebra, not elsewhere classified, lumbar region, subsequent encounter for fracture with routine healing: Secondary | ICD-10-CM | POA: Diagnosis not present

## 2017-07-06 DIAGNOSIS — Z515 Encounter for palliative care: Secondary | ICD-10-CM | POA: Diagnosis not present

## 2017-07-06 DIAGNOSIS — I10 Essential (primary) hypertension: Secondary | ICD-10-CM | POA: Diagnosis not present

## 2017-07-06 DIAGNOSIS — M810 Age-related osteoporosis without current pathological fracture: Secondary | ICD-10-CM | POA: Diagnosis not present

## 2017-07-06 DIAGNOSIS — R4182 Altered mental status, unspecified: Secondary | ICD-10-CM | POA: Diagnosis not present

## 2017-07-06 DIAGNOSIS — I5030 Unspecified diastolic (congestive) heart failure: Secondary | ICD-10-CM | POA: Diagnosis not present

## 2017-07-06 DIAGNOSIS — S42122D Displaced fracture of acromial process, left shoulder, subsequent encounter for fracture with routine healing: Secondary | ICD-10-CM | POA: Diagnosis not present

## 2017-07-06 DIAGNOSIS — L989 Disorder of the skin and subcutaneous tissue, unspecified: Secondary | ICD-10-CM | POA: Diagnosis not present

## 2017-07-06 DIAGNOSIS — E43 Unspecified severe protein-calorie malnutrition: Secondary | ICD-10-CM | POA: Diagnosis not present

## 2017-07-06 DIAGNOSIS — I482 Chronic atrial fibrillation: Secondary | ICD-10-CM | POA: Diagnosis not present

## 2017-07-06 DIAGNOSIS — E1122 Type 2 diabetes mellitus with diabetic chronic kidney disease: Secondary | ICD-10-CM | POA: Diagnosis not present

## 2017-07-06 DIAGNOSIS — I509 Heart failure, unspecified: Secondary | ICD-10-CM | POA: Diagnosis not present

## 2017-07-06 DIAGNOSIS — S32000A Wedge compression fracture of unspecified lumbar vertebra, initial encounter for closed fracture: Secondary | ICD-10-CM | POA: Diagnosis not present

## 2017-07-06 DIAGNOSIS — G9341 Metabolic encephalopathy: Secondary | ICD-10-CM | POA: Diagnosis not present

## 2017-07-06 DIAGNOSIS — I132 Hypertensive heart and chronic kidney disease with heart failure and with stage 5 chronic kidney disease, or end stage renal disease: Secondary | ICD-10-CM | POA: Diagnosis not present

## 2017-07-06 DIAGNOSIS — M84359S Stress fracture, hip, unspecified, sequela: Secondary | ICD-10-CM | POA: Diagnosis not present

## 2017-07-06 DIAGNOSIS — I5032 Chronic diastolic (congestive) heart failure: Secondary | ICD-10-CM | POA: Diagnosis not present

## 2017-07-06 DIAGNOSIS — R1312 Dysphagia, oropharyngeal phase: Secondary | ICD-10-CM | POA: Diagnosis not present

## 2017-07-06 DIAGNOSIS — Z992 Dependence on renal dialysis: Secondary | ICD-10-CM | POA: Diagnosis not present

## 2017-07-06 DIAGNOSIS — R269 Unspecified abnormalities of gait and mobility: Secondary | ICD-10-CM | POA: Diagnosis not present

## 2017-07-07 DIAGNOSIS — I482 Chronic atrial fibrillation: Secondary | ICD-10-CM | POA: Diagnosis not present

## 2017-07-07 DIAGNOSIS — I6522 Occlusion and stenosis of left carotid artery: Secondary | ICD-10-CM | POA: Diagnosis not present

## 2017-07-07 DIAGNOSIS — I5030 Unspecified diastolic (congestive) heart failure: Secondary | ICD-10-CM | POA: Diagnosis not present

## 2017-07-07 DIAGNOSIS — R2689 Other abnormalities of gait and mobility: Secondary | ICD-10-CM | POA: Diagnosis not present

## 2017-07-07 DIAGNOSIS — I132 Hypertensive heart and chronic kidney disease with heart failure and with stage 5 chronic kidney disease, or end stage renal disease: Secondary | ICD-10-CM | POA: Diagnosis not present

## 2017-07-07 DIAGNOSIS — J449 Chronic obstructive pulmonary disease, unspecified: Secondary | ICD-10-CM | POA: Diagnosis not present

## 2017-07-07 DIAGNOSIS — I272 Pulmonary hypertension, unspecified: Secondary | ICD-10-CM | POA: Diagnosis not present

## 2017-07-07 MED ORDER — INSULIN LISPRO 100 UNIT/ML ~~LOC~~ SOLN
1.00 | SUBCUTANEOUS | Status: DC
Start: 2017-07-07 — End: 2017-07-07

## 2017-07-07 MED ORDER — MORPHINE SULFATE (PF) 2 MG/ML IV SOLN
2.00 | INTRAVENOUS | Status: DC
Start: ? — End: 2017-07-07

## 2017-07-07 MED ORDER — TERAZOSIN HCL 1 MG PO CAPS
2.00 | ORAL_CAPSULE | ORAL | Status: DC
Start: 2017-07-07 — End: 2017-07-07

## 2017-07-07 MED ORDER — TRAMADOL HCL 50 MG PO TABS
50.00 | ORAL_TABLET | ORAL | Status: DC
Start: ? — End: 2017-07-07

## 2017-07-07 MED ORDER — FERROUS SULFATE 325 (65 FE) MG PO TABS
325.00 | ORAL_TABLET | ORAL | Status: DC
Start: 2017-07-08 — End: 2017-07-07

## 2017-07-07 MED ORDER — GLUCOSE 40 % PO GEL
15.00 g | ORAL | Status: DC
Start: ? — End: 2017-07-07

## 2017-07-07 MED ORDER — ACETAMINOPHEN 325 MG PO TABS
650.00 | ORAL_TABLET | ORAL | Status: DC
Start: ? — End: 2017-07-07

## 2017-07-07 MED ORDER — METOPROLOL SUCCINATE ER 25 MG PO TB24
12.50 | ORAL_TABLET | ORAL | Status: DC
Start: 2017-07-07 — End: 2017-07-07

## 2017-07-07 MED ORDER — GENERIC EXTERNAL MEDICATION
1.00 | Status: DC
Start: ? — End: 2017-07-07

## 2017-07-07 MED ORDER — GENERIC EXTERNAL MEDICATION
4.00 | Status: DC
Start: ? — End: 2017-07-07

## 2017-07-07 MED ORDER — DEXTROSE 50 % IV SOLN
12.00 g | INTRAVENOUS | Status: DC
Start: ? — End: 2017-07-07

## 2017-07-07 MED ORDER — GENERIC EXTERNAL MEDICATION
1.00 g | Status: DC
Start: 2017-07-07 — End: 2017-07-07

## 2017-07-07 MED ORDER — ATORVASTATIN CALCIUM 10 MG PO TABS
20.00 | ORAL_TABLET | ORAL | Status: DC
Start: 2017-07-07 — End: 2017-07-07

## 2017-07-07 MED ORDER — GENERIC EXTERNAL MEDICATION
5.00 | Status: DC
Start: ? — End: 2017-07-07

## 2017-07-07 MED ORDER — IPRATROPIUM BROMIDE 0.02 % IN SOLN
.50 | RESPIRATORY_TRACT | Status: DC
Start: 2017-07-07 — End: 2017-07-07

## 2017-07-07 MED ORDER — SODIUM CHLORIDE 0.9 % IJ SOLN
5.00 | INTRAMUSCULAR | Status: DC
Start: 2017-07-07 — End: 2017-07-07

## 2017-07-07 MED ORDER — HEPARIN SODIUM (PORCINE) 5000 UNIT/ML IJ SOLN
5000.00 | INTRAMUSCULAR | Status: DC
Start: 2017-07-07 — End: 2017-07-07

## 2017-07-07 MED ORDER — BUPROPION HCL ER (XL) 150 MG PO TB24
150.00 | ORAL_TABLET | ORAL | Status: DC
Start: 2017-07-08 — End: 2017-07-07

## 2017-07-07 MED ORDER — FINASTERIDE 5 MG PO TABS
5.00 | ORAL_TABLET | ORAL | Status: DC
Start: 2017-07-08 — End: 2017-07-07

## 2017-07-07 MED ORDER — GENERIC EXTERNAL MEDICATION
2.00 | Status: DC
Start: ? — End: 2017-07-07

## 2017-07-07 MED ORDER — SODIUM CHLORIDE 0.9 % IJ SOLN
5.00 | INTRAMUSCULAR | Status: DC
Start: ? — End: 2017-07-07

## 2017-07-07 MED ORDER — PANTOPRAZOLE SODIUM 40 MG PO TBEC
40.00 | DELAYED_RELEASE_TABLET | ORAL | Status: DC
Start: 2017-07-08 — End: 2017-07-07

## 2017-07-07 MED ORDER — FLUTICASONE PROPIONATE 50 MCG/ACT NA SUSP
1.00 | NASAL | Status: DC
Start: 2017-07-07 — End: 2017-07-07

## 2017-07-07 MED ORDER — ALBUTEROL SULFATE (5 MG/ML) 0.5% IN NEBU
2.50 | INHALATION_SOLUTION | RESPIRATORY_TRACT | Status: DC
Start: 2017-07-07 — End: 2017-07-07

## 2017-07-11 ENCOUNTER — Other Ambulatory Visit: Payer: Self-pay

## 2017-07-11 NOTE — Patient Outreach (Signed)
[facility]) Care Management  07/11/2017  Ronald [name] Oct 07, 1922 [id number]   Client noted to be hospitalized at [facility]: normocytic anemia, acute metabolic encephalopathy.  Plan: RNCM will follow up once discharged from the hospital.  [name], RN, MSN, [place] Coordinator Cell: [phone]

## 2017-07-13 ENCOUNTER — Encounter (HOSPITAL_COMMUNITY): Payer: Self-pay | Admitting: Oncology

## 2017-07-13 ENCOUNTER — Emergency Department (HOSPITAL_COMMUNITY)
Admission: EM | Admit: 2017-07-13 | Discharge: 2017-07-14 | Disposition: A | Discharge status: Home / Self Care | Payer: PPO | Attending: Emergency Medicine | Admitting: Emergency Medicine

## 2017-07-13 DIAGNOSIS — I5032 Chronic diastolic (congestive) heart failure: Secondary | ICD-10-CM | POA: Diagnosis not present

## 2017-07-13 DIAGNOSIS — K5901 Slow transit constipation: Secondary | ICD-10-CM | POA: Insufficient documentation

## 2017-07-13 DIAGNOSIS — Z7984 Long term (current) use of oral hypoglycemic drugs: Secondary | ICD-10-CM | POA: Insufficient documentation

## 2017-07-13 DIAGNOSIS — Z79899 Other long term (current) drug therapy: Secondary | ICD-10-CM | POA: Insufficient documentation

## 2017-07-13 DIAGNOSIS — J811 Chronic pulmonary edema: Secondary | ICD-10-CM | POA: Diagnosis not present

## 2017-07-13 DIAGNOSIS — E1122 Type 2 diabetes mellitus with diabetic chronic kidney disease: Secondary | ICD-10-CM | POA: Insufficient documentation

## 2017-07-13 DIAGNOSIS — J449 Chronic obstructive pulmonary disease, unspecified: Secondary | ICD-10-CM | POA: Insufficient documentation

## 2017-07-13 DIAGNOSIS — Z992 Dependence on renal dialysis: Secondary | ICD-10-CM | POA: Insufficient documentation

## 2017-07-13 DIAGNOSIS — K6289 Other specified diseases of anus and rectum: Secondary | ICD-10-CM | POA: Diagnosis present

## 2017-07-13 DIAGNOSIS — N186 End stage renal disease: Secondary | ICD-10-CM | POA: Insufficient documentation

## 2017-07-13 DIAGNOSIS — I132 Hypertensive heart and chronic kidney disease with heart failure and with stage 5 chronic kidney disease, or end stage renal disease: Secondary | ICD-10-CM | POA: Insufficient documentation

## 2017-07-13 DIAGNOSIS — F17210 Nicotine dependence, cigarettes, uncomplicated: Secondary | ICD-10-CM | POA: Diagnosis not present

## 2017-07-13 DIAGNOSIS — I120 Hypertensive chronic kidney disease with stage 5 chronic kidney disease or end stage renal disease: Secondary | ICD-10-CM | POA: Diagnosis not present

## 2017-07-13 DIAGNOSIS — F0390 Unspecified dementia without behavioral disturbance: Secondary | ICD-10-CM | POA: Diagnosis not present

## 2017-07-13 NOTE — ED Triage Notes (Signed)
Pt bib [facility] from home d/t rectal pain.  Pt's caretaker reported to EMS that his medications have been causing confusion.  Pt stated his, "Bowels are locked up."  Per caretaker pt had 2 normal BM's yesterday and 1 normal BM today.  Pt does have decubitus to sacrum per EMS.

## 2017-07-13 NOTE — ED Provider Notes (Signed)
[facility] EMERGENCY DEPARTMENT Provider Note   CSN: [id number] Arrival date & time: 07/13/17  2329     History   Chief Complaint Chief Complaint  Patient presents with  . Rectal Pain   Level 5 caveat due to dementia HPI Ronald [name] is a 84 y.o. male.  The history is provided by the patient.  Illness  This is a new problem. Episode onset: unknown. The problem occurs constantly. The problem has been gradually worsening. Exacerbated by: movement. Nothing relieves the symptoms.  Patient with history of end-stage renal disease, chronic CHF, chronic H fibrillation, aortic stenosis presents from home for concern for rectal pain and constipation.  Patient reports he is "locked up" and has had minimal bowel movements in 4 days.  He also reports abdominal pain.  No fevers or vomiting.  No chest pain.  Patient is a poor historian. Per EMS, caretaker at home is stated that patient has been more confused.  It has been reported that he has been having normal bowel movements per caretaker Patient is on dialysis, Monday/Wednesday/Friday Patient reports he has a known compression fracture in his low back that is causing back pain  Past Medical History:  Diagnosis Date  . AS (aortic stenosis)    bovine aortic valve replacement 01/07/11  . Bladder cancer [facility])    Bladder Cancer local  . Blood transfusion    w/hip operation  . Cellulitis of left lower extremity   . Chronic atrial fibrillation ([place])   . Chronic diastolic congestive heart failure ([place])   . Claudication in peripheral vascular disease ([place]) 06/17/2011  . COPD (chronic obstructive pulmonary disease) ([place])   . Depression wife died 4 years ago.    Marland Kitchen History of stomach ulcers ~ 1951  . HTN (hypertension)   . Neuropathy due to secondary diabetes ([place])   . Peripheral vascular disease ([place]) very poor circulation legs and feet ... stents right and left legs... done in dr j. Gwenlyn Found 's office.   . Pneumonia 07/25/11   left  . Recurrent upper respiratory infection (URI)    sinusitis  . Renal artery stenosis ([place]) 2006   renal artery stent  . S/P angioplasty with stent, diamond back rotational athrectomy Prox. Rt. SFA 06/17/2011 06/17/2011  . Shortness of breath   . Thrombocytopenia due to drugs    seen by Dr Inda Merlin plts 114000 no rx  . Type II diabetes mellitus [facility])     Patient Active Problem List   Diagnosis Date Noted  . Hemoptysis   . Bacteremia due to methicillin susceptible Staphylococcus aureus (MSSA) 05/21/2017  . Protein-calorie malnutrition, severe 05/21/2017  . Sepsis ([place]) 05/20/2017  . Pressure injury of skin 05/09/2017  . Melena   . Advanced care planning/counseling discussion   . Coagulopathy ([place])   . ESRD (end stage renal disease) ([place]) 05/05/2017  . Acute on chronic congestive heart failure ([place])   . Acute renal failure superimposed on stage 3 chronic kidney disease ([place])   . Goals of care, counseling/discussion   . DNR (do not resuscitate) discussion   . Palliative care encounter   . Acute respiratory distress 03/10/2017  . Skin excoriation 03/10/2017  . Closed fracture of left proximal humerus 12/26/2016  . Comminuted left humeral fracture 12/23/2016  . GI bleed 07/02/2016  . Lactic acidemia 07/02/2016  . Lower GI bleed   . Tobacco use 03/07/2015  . Diabetes mellitus type 2, uncontrolled ([place]) 12/20/2013  . Acute blood loss anemia 12/20/2013  .  Allergic rhinitis 12/20/2013  . Intertrochanteric fracture of left femur ([place]) 12/12/2013  . Anemia 12/12/2013  . Right acetabular fracture ([place]) 04/11/2013  . Acetabular fracture ([place]) 04/11/2013  . Bilateral claudication of lower limb ([place]) 12/24/2012  . Cellulitis 12/02/2012  . History of tobacco abuse- 75 years, quit in Feb 2014 07/06/2012  . Acute on chronic diastolic (congestive) heart failure ([place]) 07/06/2012  . COPD (chronic obstructive pulmonary disease) ([place]) 05/27/2012  . Pulmonary nodules 05/27/2012  . Normal  coronary arteries, cath 11/12 07/25/2011  . Normal left ventricular systolic function, Echo 1/88 07/25/2011  . Closed right hip fracture, ORIF 05/26/64 complicated by gluteal hematoma while being Coumadinized post op 06/27/2011  . Aspirin allergy, rash, SOB. Pt is on Plavix 06/19/2011  . Chronic atrial fibrillation ([place]) 06/19/2011  . Thrombocytopenia ([place]) 01/11/2011  . Leukocytosis 01/11/2011  . S/P aortic valve replacement: #23 Magna Ease Edwards Pericardial Valve  November 2012 01/08/2011  . AS (aortic stenosis)- s/p tissue AVR 01/07/11   . CAROTID BRUIT- moderate ICA disease 5/14 05/31/2008  . Diabetes mellitus with complication ([place]) 08/25/1599  . Hyperlipidemia 07/11/2007  . DEPRESSION 07/11/2007  . RESTLESS LEGS SYNDROME 07/11/2007  . Essential hypertension 07/11/2007  . PVD, Rt SFA PTA/HSRA 06/17/11 07/11/2007  . DIVERTICULOSIS, COLON 07/11/2007  . RENAL CYST 07/11/2007  . ARTHRITIS 07/11/2007  . SKIN CANCER, HX OF 07/11/2007  . RHEUMATIC HEART DISEASE, HX OF 07/11/2007    Past Surgical History:  Procedure Laterality Date  . ABDOMINAL ANGIOGRAM  06/17/2011   Procedure: ABDOMINAL ANGIOGRAM;  Surgeon: Lorretta Harp, MD;  Location: [facility] CATH LAB;  Service: Cardiovascular;;  . AORTIC VALVE REPLACEMENT  01/07/2011   Procedure: AORTIC VALVE REPLACEMENT (AVR);  Surgeon: Grace Isaac, MD;  Location: [place];  Service: Open Heart Surgery;  Laterality: N/A;; magna-ease bovine 44mm bioprosthesis  . ATHERECTOMY N/A 06/17/2011   Procedure: ATHERECTOMY;  Surgeon: Lorretta Harp, MD;  Location: [facility] CATH LAB;  Service: Cardiovascular;  Laterality: N/A;  . AV FISTULA PLACEMENT Left 04/24/2017   Procedure: INSERTION OF ARTERIOVENOUS (AV) GORE-TEX GRAFT ARM;  Surgeon: Conrad [place], MD;  Location: [place];  Service: Vascular;  Laterality: Left;  . CARDIAC CATHETERIZATION  11/19/10   normal coronaries, mod AS, 75% l RAS  . CARDIOVERSION  04/03/2011   Procedure: CARDIOVERSION;  Surgeon: Leonie Man, MD;  Location: [facility];  Service: Cardiovascular;  Laterality: N/A;  . CATARACT EXTRACTION W/ INTRAOCULAR LENS  IMPLANT, BILATERAL  ~ 2007  . CHOLECYSTECTOMY    . ESOPHAGOGASTRODUODENOSCOPY N/A 05/08/2017   Procedure: ESOPHAGOGASTRODUODENOSCOPY (EGD);  Surgeon: Irene Shipper, MD;  Location[facility] ENDOSCOPY;  Service: Endoscopy;  Laterality: N/A;  . FEMUR IM NAIL  06/27/2011   Procedure: INTRAMEDULLARY (IM) NAIL FEMORAL;  Surgeon: Marin Shutter, MD;  Location: [facility] ORS;  Service: Orthopedics;  Laterality: Right;  . FEMUR IM NAIL Left 12/14/2013   Procedure: INTRAMEDULLARY (IM) NAIL FEMORAL;  Surgeon: Mauri Pole, MD;  Location: [facility] ORS;  Service: Orthopedics;  Laterality: Left;  . FRACTURE SURGERY    . IR FLUORO GUIDE CV LINE LEFT  04/17/2017  . IR FLUORO GUIDE CV LINE RIGHT  06/11/2017  . IR US GUIDE VASC ACCESS LEFT  04/17/2017  . IR US GUIDE VASC ACCESS RIGHT  06/11/2017  . LOWER EXTREMITY ANGIOGRAM  06/17/2011   diamondback orbital rotational and cutting balloon atherectomy of the prox R SFA  . LOWER EXTREMITY ANGIOGRAM Bilateral 06/17/2011   Procedure: LOWER EXTREMITY ANGIOGRAM;  Surgeon:  Lorretta Harp, MD;  Location: [facility] CATH LAB;  Service: Cardiovascular;  Laterality: Bilateral;  . PERIPHERAL ARTERIAL STENT GRAFT     2006 left anf right illiac stents Dr Deon Pilling  . RENAL ARTERY STENT  2006   "I believe"  . REVERSE SHOULDER ARTHROPLASTY Left 12/31/2016   Procedure: LEFT REVERSE SHOULDER ARTHROPLASTY;  Surgeon: Nicholes Stairs, MD;  Location: [place] [facility];  Service: Orthopedics;  Laterality: Left;  . TONSILLECTOMY AND ADENOIDECTOMY     "when I was a kid"        Home Medications    Prior to Admission medications   Medication Sig Start Date End Date Taking? Authorizing Provider  acetaminophen (TYLENOL) 325 MG tablet Take 650 mg by mouth every 6 (six) hours as needed ("for pain score 1-4; not to exceed #8 tablets in 24 hours").     [provider]  albuterol (ACCUNEB) 0.63  MG/3ML nebulizer solution Take 3 mLs by nebulization every 6 (six) hours as needed for wheezing or shortness of breath.    [provider]  buPROPion (WELLBUTRIN XL) 150 MG 24 hr tablet Take 150 mg by mouth daily.     [provider]  diphenhydrAMINE (BENADRYL) 12.5 MG/5ML elixir Take 5 mLs (12.5 mg total) by mouth every 6 (six) hours as needed for itching. Over the counter Patient taking differently: Take 12.5 mg by mouth every 6 (six) hours as needed for itching.  04/25/17   Rai, Vernelle Emerald, MD  doxazosin (CARDURA) 2 MG tablet Take 1 tablet (2 mg total) by mouth at bedtime. 04/25/17   Rai, Ripudeep K, MD  ferrous sulfate 325 (65 FE) MG EC tablet Take 1 tablet (325 mg total) by mouth 2 (two) times daily. Patient taking differently: Take 325 mg by mouth 2 (two) times daily with a meal.  07/04/16 07/04/17  Lavina Hamman, MD  finasteride (PROSCAR) 5 MG tablet Take 5 mg by mouth at bedtime.     [provider]  fluticasone (FLONASE) 50 MCG/ACT nasal spray Place 1 spray into both nostrils 2 (two) times daily.     [provider]  glipiZIDE (GLUCOTROL) 5 MG tablet Take 5 mg by mouth daily before breakfast.    [provider]  loratadine (CLARITIN) 10 MG tablet Take 10 mg by mouth daily.    [provider]  Melatonin 5 MG TABS Take 5 mg by mouth at bedtime.    [provider]  metoprolol succinate (TOPROL-XL) 25 MG 24 hr tablet Take 0.5 tablets (12.5 mg total) by mouth 2 (two) times daily with a meal. 04/26/17   Rai, Ripudeep K, MD  Multiple Vitamins-Minerals (MULTIVITAMIN WITH MINERALS) tablet Take 1 tablet by mouth at bedtime.    [provider]  neomycin-bacitracin-polymyxin (NEOSPORIN) ointment Apply 1 application topically daily. Apply to left elbow and forearm daily for 10 days. Started 05/14/17.    [provider]  OXYGEN Inhale 2 L into the lungs continuous.    [provider]  pantoprazole (PROTONIX) 40 MG tablet  Take 1 tablet (40 mg total) by mouth daily. 05/10/17   Aline August, MD  simvastatin (ZOCOR) 20 MG tablet Take 20 mg by mouth every evening.    [provider]  traMADol (ULTRAM) 50 MG tablet Take 1 tablet (50 mg total) by mouth every 12 (twelve) hours as needed for moderate pain. 05/26/17   Rai, Ripudeep K, MD  Umeclidinium-Vilanterol (ANORO ELLIPTA) 62.5-25 MCG/INH AEPB Inhale 1 puff into the lungs daily.  [provider]    Family History Family History  Problem Relation Age of Onset  . Heart disease Mother   . Cancer Brother        colon  . Stroke Father     Social History Social History   Tobacco Use  . Smoking status: Current Every Day Smoker    Packs/day: 1.50    Years: 75.00    Pack years: 112.50    Types: Cigarettes  . Smokeless tobacco: Never Used  Substance Use Topics  . Alcohol use: No    Alcohol/week: 1.2 oz    Types: 2 Cans of beer per week  . Drug use: No     Allergies   Aspirin and Tape   Review of Systems Review of Systems  Unable to perform ROS: Dementia     Physical Exam Updated Vital Signs BP (!) 113/42 (BP Location: Right Leg)   Pulse 90   Temp 99.3 F (37.4 C) (Oral)   Resp (!) 25   Ht 1.803 m (5\' 11" )   Wt 64 kg (141 lb)   SpO2 96%   BMI 19.67 kg/m   Physical Exam CONSTITUTIONAL: Elderly, cachectic HEAD: Normocephalic/atraumatic EYES: EOMI/PERRL ENMT: Mucous membranes dry NECK: supple no meningeal signs CV: S1/S2 noted, no murmurs/rubs/gallops noted LUNGS: Coarse breath sounds bilaterally, no apparent distress ABDOMEN: soft, nontender, no rebound or guarding, bowel sounds noted throughout abdomen GU: Patient with diaper Rectal-brown stool noted stool impaction noted no blood noted no masses noted nurse present for exam Small stage I sacral wound noted NEURO: Pt is awake/alert, appears confused, moves all extremitiesx4.  No facial droop.   EXTREMITIES: pulses normal/equal, full ROM, no deformities SKIN: Dry  skin noted, scattered skin abrasions bruising throughout extremities Dialysis catheter noted in right upper chest PSYCH: Unable to fully assess  ED Treatments / Results  Labs (all labs ordered are listed, but only abnormal results are displayed) Labs Reviewed  COMPREHENSIVE METABOLIC PANEL - Abnormal; Notable for the following components:      Result Value   Chloride 100 (*)    Glucose, Bld 190 (*)    BUN 29 (*)    Creatinine, Ser 4.10 (*)    Calcium 8.5 (*)    Albumin 2.0 (*)    AST 12 (*)    ALT 7 (*)    GFR calc non Af Amer 11 (*)    GFR calc Af Amer 13 (*)    All other components within normal limits  CBC WITH DIFFERENTIAL/PLATELET - Abnormal; Notable for the following components:   RBC 4.21 (*)    Hemoglobin 10.9 (*)    HCT 36.0 (*)    MCH 25.9 (*)    RDW 17.6 (*)    Neutro Abs 8.3 (*)    All other components within normal limits  LIPASE, BLOOD    EKG None  Radiology Dg Abdomen Acute W/chest  Result Date: 07/14/2017 CLINICAL DATA:  Rectal pain EXAM: DG ABDOMEN ACUTE W/ 1V CHEST COMPARISON:  07/06/2017 FINDINGS: Single-view chest demonstrates postsurgical changes of the mediastinum. Right-sided central venous catheter tip overlies the proximal right atrium. Cardiomegaly with vascular congestion and mild pulmonary edema. Left shoulder replacement. Supine and upright views of the abdomen demonstrate no free air beneath the diaphragm. Nonobstructed gas pattern with residual contrast in the colon. Moderate stool in the colon. Bilateral iliac stents. IMPRESSION: 1. Single-view chest demonstrates cardiomegaly with vascular congestion and mild pulmonary edema 2. Nonobstructed gas pattern with moderate stool in the colon  Electronically Signed   By: Donavan Foil M.D.   On: 07/14/2017 00:56    Procedures Fecal disimpaction Date/Time: 07/14/2017 12:04 AM Performed by: Ripley Fraise, MD Authorized by: Ripley Fraise, MD  Consent: Verbal consent obtained. Local anesthesia  used: no  Anesthesia: Local anesthesia used: no  Sedation: Patient sedated: no  Patient tolerance: Patient tolerated the procedure well with no immediate complications Comments: Manual fecal disimpaction performed patient tolerated well without any immediate complications Nurse present for exam    (including critical care time)  Medications Ordered in ED Medications - No data to display   Initial Impression / Assessment and Plan / ED Course  I have reviewed the triage vital signs and the nursing notes.  Pertinent labs & imaging results that were available during my care of the patient were reviewed by me and considered in my medical decision making (see chart for details).     12:04 AM Patient presents from home with reported constipation.  He did have evidence of fecal impaction that was disimpacted He is also reporting abdominal pain.,  But abdominal exam is unremarkable Labs and imaging ordered. 2:15 AM Xray reveals constipation.  Labs near baseline.  Patient resting comfortably BP 121/87   Pulse 90   Temp 99.1 F (37.3 C) (Rectal)   Resp 18   Ht 1.803 m (5\' 11" )   Wt 64 kg (141 lb)   SpO2 96%   BMI 19.67 kg/m  I had a long discussion with his daughter Sasaki at phone number [phone] Patient has refused hospice and wants to continue on dialysis.  He has been requesting home dialysis, but daughter has stated that that would be very challenging.  He was recently in the hospital at [facility] for confusion.  He also was recently in [facility] as well. He does have lumbar compression fractures requiring pain medicines, but becomes very confused while on pain meds. Tonight he called the ambulance as he wanted an enema placed.  The caregiver at home was unaware that he had called 911 There were no other acute issues tonight per daughter He has fired multiple caregivers at home and has started to refuse to go into other facilities. However he is beginning to  exceed capabilities at home.  Daughter will call the caregiver to come sit with him later tonight.  I have placed a order for home health and case management and advised her to call PCP for potential admission to SNF. 3:33 AM Patient stable and currently resting comfortably he has no new complaints I have low suspicion for acute abdominal emergency. I stressed the importance to the patient considering placement in a facility due to his severe back pain difficulty getting back and forth to dialysis at home.  Also advised him to be very challenging to have dialysis at home, and this was likely not possible Reports he will consider it.  Already spoke to his daughter Deady, and another daughter will be at the house waiting on him for arrival Final Clinical Impressions(s) / ED Diagnoses   Final diagnoses:  ESRD (end stage renal disease) ([place])  Slow transit constipation    ED Discharge Orders    None       Ripley Fraise, MD 07/14/17 [phone]

## 2017-07-14 ENCOUNTER — Telehealth: Payer: Self-pay | Admitting: *Deleted

## 2017-07-14 ENCOUNTER — Emergency Department (HOSPITAL_COMMUNITY): Payer: PPO

## 2017-07-14 DIAGNOSIS — J811 Chronic pulmonary edema: Secondary | ICD-10-CM | POA: Diagnosis not present

## 2017-07-14 LAB — COMPREHENSIVE METABOLIC PANEL
ALK PHOS: 97 U/L (ref 38–126)
ALT: 7 U/L — AB (ref 17–63)
ANION GAP: 9 (ref 5–15)
AST: 12 U/L — ABNORMAL LOW (ref 15–41)
Albumin: 2 g/dL — ABNORMAL LOW (ref 3.5–5.0)
BILIRUBIN TOTAL: 0.7 mg/dL (ref 0.3–1.2)
BUN: 29 mg/dL — ABNORMAL HIGH (ref 6–20)
CALCIUM: 8.5 mg/dL — AB (ref 8.9–10.3)
CO2: 27 mmol/L (ref 22–32)
CREATININE: 4.1 mg/dL — AB (ref 0.61–1.24)
Chloride: 100 mmol/L — ABNORMAL LOW (ref 101–111)
GFR calc non Af Amer: 11 mL/min — ABNORMAL LOW (ref >60)
GFR, EST AFRICAN AMERICAN: 13 mL/min — AB (ref >60)
Glucose, Bld: 190 mg/dL — ABNORMAL HIGH (ref 65–99)
Potassium: 4.2 mmol/L (ref 3.5–5.1)
SODIUM: 136 mmol/L (ref 135–145)
TOTAL PROTEIN: 6.7 g/dL (ref 6.5–8.1)

## 2017-07-14 LAB — CBC WITH DIFFERENTIAL/PLATELET
ABS IMMATURE GRANULOCYTES: 0.1 10*3/uL (ref 0.0–0.1)
Basophils Absolute: 0.1 10*3/uL (ref 0.0–0.1)
Basophils Relative: 1 %
Eosinophils Absolute: 0.2 10*3/uL (ref 0.0–0.7)
Eosinophils Relative: 2 %
HEMATOCRIT: 36 % — AB (ref 39.0–52.0)
HEMOGLOBIN: 10.9 g/dL — AB (ref 13.0–17.0)
Immature Granulocytes: 1 %
LYMPHS PCT: 8 %
Lymphs Abs: 0.9 10*3/uL (ref 0.7–4.0)
MCH: 25.9 pg — AB (ref 26.0–34.0)
MCHC: 30.3 g/dL (ref 30.0–36.0)
MCV: 85.5 fL (ref 78.0–100.0)
MONO ABS: 1 10*3/uL (ref 0.1–1.0)
MONOS PCT: 9 %
Neutro Abs: 8.3 10*3/uL — ABNORMAL HIGH (ref 1.7–7.7)
Neutrophils Relative %: 79 %
Platelets: 184 10*3/uL (ref 150–400)
RBC: 4.21 MIL/uL — ABNORMAL LOW (ref 4.22–5.81)
RDW: 17.6 % — ABNORMAL HIGH (ref 11.5–15.5)
WBC: 10.5 10*3/uL (ref 4.0–10.5)

## 2017-07-14 LAB — LIPASE, BLOOD: Lipase: 22 U/L (ref 11–51)

## 2017-07-14 NOTE — Telephone Encounter (Signed)
EDCM consulted to assist with [place].  Pt currently active with [facility] and [facility].

## 2017-07-15 ENCOUNTER — Other Ambulatory Visit: Payer: Self-pay

## 2017-07-15 DIAGNOSIS — I4891 Unspecified atrial fibrillation: Secondary | ICD-10-CM | POA: Diagnosis not present

## 2017-07-15 DIAGNOSIS — I5032 Chronic diastolic (congestive) heart failure: Secondary | ICD-10-CM | POA: Diagnosis not present

## 2017-07-15 DIAGNOSIS — M810 Age-related osteoporosis without current pathological fracture: Secondary | ICD-10-CM | POA: Diagnosis not present

## 2017-07-15 DIAGNOSIS — S52591A Other fractures of lower end of right radius, initial encounter for closed fracture: Secondary | ICD-10-CM | POA: Diagnosis not present

## 2017-07-15 DIAGNOSIS — J449 Chronic obstructive pulmonary disease, unspecified: Secondary | ICD-10-CM | POA: Diagnosis not present

## 2017-07-15 DIAGNOSIS — R296 Repeated falls: Secondary | ICD-10-CM | POA: Diagnosis not present

## 2017-07-15 DIAGNOSIS — S42122D Displaced fracture of acromial process, left shoulder, subsequent encounter for fracture with routine healing: Secondary | ICD-10-CM | POA: Diagnosis not present

## 2017-07-15 DIAGNOSIS — M84359S Stress fracture, hip, unspecified, sequela: Secondary | ICD-10-CM | POA: Diagnosis not present

## 2017-07-15 DIAGNOSIS — R269 Unspecified abnormalities of gait and mobility: Secondary | ICD-10-CM | POA: Diagnosis not present

## 2017-07-15 DIAGNOSIS — R0602 Shortness of breath: Secondary | ICD-10-CM | POA: Diagnosis not present

## 2017-07-15 NOTE — Patient Outreach (Addendum)
[facility]) Care Management  07/15/2017  Ronald [name] 08-Feb-1923 [id number]   84 year old admitted 2/14-3/2 with heart failure; new hemodialysis patient. History of heart failure, COPD,atrial fibrillation; diabetes; lower Gastro-Intestinal bleed.  Client admitted 05/20/17-05/26/17 with Sepsis/Bacteremia.[name]. Client discharged back to his home with 24/7 assistance on 06/10/17. Admitted 07/06/17 at [facility] Emergency room visit 07/13/17.  Provider office completes transition of care call. RNCM called to follow up for care management needs post hospitalization and Emergency room visit.  Subjective: per daughter Ronald [name], "I feel like it is time for hospice".  RNCM called to follow up, spoke with daughter Ronald [name] who reports that the home health is with client now and is going to call primary care regarding Hospice referral. [name] states that they have not been able to get client to eat or drink much today and that they are not able to get him to dialysis. She states her brother Ronald [name] is still out of town and she is on her way to client's home now.  Plan: follow up with home health nurse regarding hospice referral, continue to follow.  [name], RN, MSN, [place] Coordinator Cell: [phone]

## 2017-07-15 NOTE — Patient Outreach (Signed)
[facility]) Care Management  07/15/2017  Ronald [name] December 16, 1922 [id number]   Care Coordination: RNCM called and spoke with [facility] nurse, Ronald [name] who states she spoke with client and daughter Ronald [name] per telephonic conference regarding palliative care/Hospice. She states that client refused dialysis on yesterday and is not taking in much food/drink today. She states client did not say no to palliative care. Ms. Ronald [name] called Dr. Keane Police office-spoke with Ronald [name]. She states primary care is going to make a referral and update her.   Ms. Ronald [name] to call RNCM with update once she received follow up from primary care office.   Plan: continue to follow.   [name], RN, MSN, [place] Coordinator Cell: [phone]

## 2017-07-17 ENCOUNTER — Encounter: Payer: Self-pay | Admitting: Internal Medicine

## 2017-07-18 ENCOUNTER — Other Ambulatory Visit: Payer: Self-pay

## 2017-07-18 NOTE — Patient Outreach (Signed)
[facility]) Care Management  07/18/2017  Ronald [name] 07-May-1922 [id number]   Care Coordination: RNCM called [facility] nurse, Jacqulynn Cadet, regarding update. HIPPA compliant message left.  Plan: await return call and continue to follow.  [name], RN, MSN, [place] Coordinator Cell: [phone]

## 2017-07-18 NOTE — Patient Outreach (Signed)
[facility]) Care Management  07/18/2017  Ronald [name] [birth year] [id number]   Care Coordinator: RNCM received return message from advanced home health nurse, who reports that client refused Hospice when they got out to his home. She also reports that home health is no longer involved due to client's care needs are custodial.  Plan: follow up with client/family next week.  [name], RN, MSN, [place] Coordinator Cell: [phone]

## 2017-07-22 ENCOUNTER — Telehealth: Payer: Self-pay

## 2017-07-22 ENCOUNTER — Other Ambulatory Visit: Payer: Self-pay

## 2017-07-22 NOTE — Patient Outreach (Signed)
[facility]) Care Management  07/22/2017  Ronald [name] [birth year] [id number]   Care coordination: per chart, client is active with [facility] and Palliative care of [facility] for palliative care services. RNCM called and confirmed with Margie at [facility] that client cannot be active with both programs at the same time. RNCM called Palliative care nurse, Maxwell Caul, to discuss care plan. HIPPA compliant voice message left.  Plan: await return call and continue to follow.  [name], RN, MSN, [place] Coordinator Cell: [phone]

## 2017-07-22 NOTE — Patient Outreach (Signed)
[facility]) Care Management  07/22/2017  Ronald [name] 03/03/1922 [id number]  84 year old admitted 2/14-3/2 with heart failure; new hemodialysis patient. History of heart failure, COPD,atrial fibrillation; diabetes; lower Gastro-Intestinal bleed.  Client admitted 05/20/17-05/26/17 with Sepsis/Bacteremia.[name]. Client discharged back to his home with 24/7 assistance on 06/10/17.  RNCM called and spoke with Daughter, [name], who reports that Client is active with Hosp[facility] and Palliative care of [facility] for palliative care. She reports that client declined hospice cause "he wants to continue to live and do dialysis". Ms. Hassie Bruce states that nurse from palliative care will be out to see client tomorrow. She is concerned about clients "bed sores" and is interested in a nurse coming out to change client's dressings.   Per [name], he went to dialysis yesterday and continues his Monday Wednesday Friday. She adds that he is not going into his work office, only to dialysis and that He is completely bedridden.   He has personal Care Givers from [facility] home care and they take him to his dialysis.   Plan: home visit scheduled.   [name], RN, MSN, [place] Coordinator Cell: [phone]

## 2017-07-22 NOTE — Patient Outreach (Signed)
[facility]) Care Management  07/22/2017  ALHAJI MCNEAL 1922-11-29 [id number]  84 year old admitted 2/14-3/2 with heart failure; new hemodialysis patient. History of heart failure, COPD,atrial fibrillation; diabetes; lower Gastro-Intestinal bleed.  Client admitted 05/20/17-05/26/17 with Sepsis/Bacteremia.[name]. Client discharged back to his home with 24/7 assistance on 06/10/17.  RNCM received message from  Home health nurse that client refused Hospice when they came out and the home health nursing is no longer involved as client is custodial care.  [facility]-RNCM called [facility] and spoke with Margie to discuss the possibility of client being eligible for the Care Connection Program.  Plan: [facility] will discuss care connection program with patient/family.  [name], RN, MSN, [name] Coordinator Cell: [phone]

## 2017-07-22 NOTE — Telephone Encounter (Signed)
Phone call placed to patient's daughter to offer to schedule visit with Palliative Care. Patient has dialysis Mon/Wed//Fri. Scheduled for 07/23/17

## 2017-07-22 NOTE — Telephone Encounter (Signed)
Received message to call Caryl Pina, RN for Dr. [name]. Message left.

## 2017-07-23 ENCOUNTER — Other Ambulatory Visit: Payer: PPO | Admitting: Internal Medicine

## 2017-07-23 DIAGNOSIS — N186 End stage renal disease: Secondary | ICD-10-CM

## 2017-07-23 DIAGNOSIS — Z515 Encounter for palliative care: Secondary | ICD-10-CM

## 2017-07-23 DIAGNOSIS — L89322 Pressure ulcer of left buttock, stage 2: Secondary | ICD-10-CM

## 2017-07-23 DIAGNOSIS — Z7189 Other specified counseling: Secondary | ICD-10-CM

## 2017-07-23 DIAGNOSIS — R52 Pain, unspecified: Secondary | ICD-10-CM

## 2017-07-24 ENCOUNTER — Ambulatory Visit: Payer: PPO | Admitting: Internal Medicine

## 2017-07-24 ENCOUNTER — Telehealth: Payer: Self-pay

## 2017-07-24 ENCOUNTER — Encounter: Payer: Self-pay | Admitting: Internal Medicine

## 2017-07-24 VITALS — BP 160/110 | HR 98 | Temp 98.2°F

## 2017-07-24 DIAGNOSIS — N186 End stage renal disease: Secondary | ICD-10-CM | POA: Diagnosis not present

## 2017-07-24 DIAGNOSIS — B9561 Methicillin susceptible Staphylococcus aureus infection as the cause of diseases classified elsewhere: Secondary | ICD-10-CM

## 2017-07-24 DIAGNOSIS — R52 Pain, unspecified: Secondary | ICD-10-CM

## 2017-07-24 DIAGNOSIS — R7881 Bacteremia: Secondary | ICD-10-CM | POA: Diagnosis not present

## 2017-07-24 MED ORDER — ACETAMINOPHEN 325 MG PO TABS
325.0000 mg | ORAL_TABLET | Freq: Once | ORAL | Status: AC
  Administered 2017-07-24: 325 mg via ORAL

## 2017-07-24 NOTE — Telephone Encounter (Signed)
Called [facility] per Dr. Megan Salon about pt's home health nurse and some questions. Informed Mary at [facility] that all questions must be routed to Dr. [name] PCP and not Dr. Megan Salon. Mary verbalized understanding and will inform home health nurse. [place]

## 2017-07-24 NOTE — Progress Notes (Signed)
[place] for Infectious Disease  Patient Active Problem List   Diagnosis Date Noted  . Bacteremia due to methicillin susceptible Staphylococcus aureus (MSSA) 05/21/2017    Priority: High  . Sepsis ([place]) 05/20/2017    Priority: High  . S/P aortic valve replacement: #23 Magna Ease Edwards Pericardial Valve  November 2012 01/08/2011    Priority: Medium  . Hemoptysis   . Protein-calorie malnutrition, severe 05/21/2017  . Pressure injury of skin 05/09/2017  . Melena   . Advanced care planning/counseling discussion   . Coagulopathy ([place])   . ESRD (end stage renal disease) ([place]) 05/05/2017  . Acute on chronic congestive heart failure ([place])   . Acute renal failure superimposed on stage 3 chronic kidney disease ([place])   . Goals of care, counseling/discussion   . DNR (do not resuscitate) discussion   . Palliative care encounter   . Acute respiratory distress 03/10/2017  . Skin excoriation 03/10/2017  . Closed fracture of left proximal humerus 12/26/2016  . Comminuted left humeral fracture 12/23/2016  . GI bleed 07/02/2016  . Lactic acidemia 07/02/2016  . Lower GI bleed   . Tobacco use 03/07/2015  . Diabetes mellitus type 2, uncontrolled ([place]) 12/20/2013  . Acute blood loss anemia 12/20/2013  . Allergic rhinitis 12/20/2013  . Intertrochanteric fracture of left femur ([place]) 12/12/2013  . Anemia 12/12/2013  . Right acetabular fracture ([place]) 04/11/2013  . Acetabular fracture ([place]) 04/11/2013  . Bilateral claudication of lower limb ([place]) 12/24/2012  . Cellulitis 12/02/2012  . History of tobacco abuse- 75 years, quit in Feb 2014 07/06/2012  . Acute on chronic diastolic (congestive) heart failure ([place]) 07/06/2012  . COPD (chronic obstructive pulmonary disease) ([place]) 05/27/2012  . Pulmonary nodules 05/27/2012  . Normal coronary arteries, cath 11/12 07/25/2011  . Normal left ventricular systolic function, Echo 1/19 07/25/2011  . Closed right hip fracture, ORIF 02/29/76  complicated by gluteal hematoma while being Coumadinized post op 06/27/2011  . Aspirin allergy, rash, SOB. Pt is on Plavix 06/19/2011  . Chronic atrial fibrillation ([place]) 06/19/2011  . Thrombocytopenia ([place]) 01/11/2011  . Leukocytosis 01/11/2011  . AS (aortic stenosis)- s/p tissue AVR 01/07/11   . CAROTID BRUIT- moderate ICA disease 5/14 05/31/2008  . Diabetes mellitus with complication ([place]) 29/56/2130  . Hyperlipidemia 07/11/2007  . DEPRESSION 07/11/2007  . RESTLESS LEGS SYNDROME 07/11/2007  . Essential hypertension 07/11/2007  . PVD, Rt SFA PTA/HSRA 06/17/11 07/11/2007  . DIVERTICULOSIS, COLON 07/11/2007  . RENAL CYST 07/11/2007  . ARTHRITIS 07/11/2007  . SKIN CANCER, HX OF 07/11/2007  . RHEUMATIC HEART DISEASE, HX OF 07/11/2007    Patient's Medications  New Prescriptions   No medications on file  Previous Medications   ACETAMINOPHEN (TYLENOL) 325 MG TABLET    Take 650 mg by mouth every 6 (six) hours as needed ("for pain score 1-4; not to exceed #8 tablets in 24 hours").    ALBUTEROL (ACCUNEB) 0.63 MG/3ML NEBULIZER SOLUTION    Take 3 mLs by nebulization every 6 (six) hours as needed for wheezing or shortness of breath.   BUPROPION (WELLBUTRIN XL) 150 MG 24 HR TABLET    Take 150 mg by mouth daily.    DIPHENHYDRAMINE (BENADRYL) 12.5 MG/5ML ELIXIR    Take 5 mLs (12.5 mg total) by mouth every 6 (six) hours as needed for itching. Over the counter   DOXAZOSIN (CARDURA) 2 MG TABLET    Take 1 tablet (2 mg total) by mouth at bedtime.   FERROUS  SULFATE 325 (65 FE) MG EC TABLET    Take 1 tablet (325 mg total) by mouth 2 (two) times daily.   FINASTERIDE (PROSCAR) 5 MG TABLET    Take 5 mg by mouth at bedtime.    FLUTICASONE (FLONASE) 50 MCG/ACT NASAL SPRAY    Place 1 spray into both nostrils 2 (two) times daily.    GLIPIZIDE (GLUCOTROL) 5 MG TABLET    Take 5 mg by mouth daily before breakfast.   LORATADINE (CLARITIN) 10 MG TABLET    Take 10 mg by mouth daily.   MELATONIN 5 MG TABS    Take 5 mg  by mouth at bedtime.   METOPROLOL SUCCINATE (TOPROL-XL) 25 MG 24 HR TABLET    Take 0.5 tablets (12.5 mg total) by mouth 2 (two) times daily with a meal.   MULTIPLE VITAMINS-MINERALS (MULTIVITAMIN WITH MINERALS) TABLET    Take 1 tablet by mouth at bedtime.   NEOMYCIN-BACITRACIN-POLYMYXIN (NEOSPORIN) OINTMENT    Apply 1 application topically daily. Apply to left elbow and forearm daily for 10 days. Started 05/14/17.   OXYGEN    Inhale 2 L into the lungs continuous.   PANTOPRAZOLE (PROTONIX) 40 MG TABLET    Take 1 tablet (40 mg total) by mouth daily.   SIMVASTATIN (ZOCOR) 20 MG TABLET    Take 20 mg by mouth every evening.   TRAMADOL (ULTRAM) 50 MG TABLET    Take 1 tablet (50 mg total) by mouth every 12 (twelve) hours as needed for moderate pain.   UMECLIDINIUM-VILANTEROL (ANORO ELLIPTA) 62.5-25 MCG/INH AEPB    Inhale 1 puff into the lungs daily.  Modified Medications   No medications on file  Discontinued Medications   No medications on file    Subjective: Ronald [name] is in for his hospital follow-up visit.  He is accompanied by his daughter, Ronald [name], and his Psychologist, sport and exercise, Juanda Crumble.  He is a 84 y.o. male with end-stage renal disease who recently started hemodialysis via a left IJ hemodialysis catheter.  He also had a left upper arm graft placed.  He previously had aortic valve replacement with a pericardial valve in 2012.  He developed acute confusion in late March and was found with blood on his shirt and there was concern for a recurrent GI bleed leading to admission.  Blood cultures grew MSSA.  Chest x-ray suggested possibility of pneumonia but a CT scan shows no infiltrates, a moderate size right pleural effusion and a slightly enlarged pancreatic mass.  There was no evidence of endocarditis by clinical exam or TTE.  He did not want to undergo TEE.  I recommended treatment with IV cefazolin administered after hemodialysis for 6 weeks.  He would have completed therapy on 07/01/2017.  He has not had  any recent fever, chills or sweats.  He has been bothered by severe back pain and was recently told that he had a compression fracture of L4.  He believes that his back pain started acutely after he left the hospital.  He has chosen to continue hemodialysis.  He tells me repeatedly that his PCP, Dr. [name], "does not like hemodialysis".  His daughter Ronald [name] says that she believes he was referring to not recommending home dialysis.  Mr. Hutsell as a great deal of pain transferring from bed to wheelchair and into a car to get back and forth from hemodialysis.  He has been largely bedridden recently and has a sacral decubitus being managed by [facility].    Review of  Systems: Review of Systems  Unable to perform ROS: Mental acuity    Past Medical History:  Diagnosis Date  . AS (aortic stenosis)    bovine aortic valve replacement 01/07/11  . Bladder cancer [facility])    Bladder Cancer local  . Blood transfusion    w/hip operation  . Cellulitis of left lower extremity   . Chronic atrial fibrillation ([place])   . Chronic diastolic congestive heart failure ([place])   . Claudication in peripheral vascular disease ([place]) 06/17/2011  . COPD (chronic obstructive pulmonary disease) ([employer])   . Depression wife died 4 years ago.    Marland Kitchen History of stomach ulcers ~ 1951  . HTN (hypertension)   . Neuropathy due to secondary diabetes ([place])   . Peripheral vascular disease ([place]) very poor circulation legs and feet ... stents right and left legs... done in dr j. Gwenlyn Found 's office.   . Pneumonia 07/25/11   left  . Recurrent upper respiratory infection (URI)    sinusitis  . Renal artery stenosis ([place]) 2006   renal artery stent  . S/P angioplasty with stent, diamond back rotational athrectomy Prox. Rt. SFA 06/17/2011 06/17/2011  . Shortness of breath   . Thrombocytopenia due to drugs    seen by Dr Inda Merlin plts 114000 no rx  . Type II diabetes mellitus (HCC)     Social History   Tobacco Use  . Smoking  status: Current Every Day Smoker    Packs/day: 1.50    Years: 75.00    Pack years: 112.50    Types: Cigarettes  . Smokeless tobacco: Never Used  Substance Use Topics  . Alcohol use: No    Alcohol/week: 1.2 oz    Types: 2 Cans of beer per week  . Drug use: No    Family History  Problem Relation Age of Onset  . Heart disease Mother   . Cancer Brother        colon  . Stroke Father     Allergies  Allergen Reactions  . Aspirin Anaphylaxis, Shortness Of Breath, Rash and Other (See Comments)    "broke out in white welts; red blotches neck and face; windpipe closed; ~ 1962"  . Tape Other (See Comments)    Patient's skin is VERY THIN and tears and bruises easily!!    Objective: Vitals:   07/24/17 1127 07/24/17 1203  BP: (!) 178/131 (!) 160/110  Pulse: 60 98  Temp: 98.2 F (36.8 C)   TempSrc: Oral    There is no height or weight on file to calculate BMI.  Physical Exam  Constitutional:  He is alert, somewhat agitated and confused.  Cardiovascular: Normal rate, regular rhythm and normal heart sounds.  No murmur heard. Pulmonary/Chest: Effort normal and breath sounds normal.  Right IJ hemodialysis catheter site appears normal.  Neurological: He is alert.  Skin: No rash noted.    Lab Results    Problem List Items Addressed This Visit      High   Bacteremia due to methicillin susceptible Staphylococcus aureus (MSSA)    I am hopeful that his MSSA bacteremia has now been cured.  He has no evidence of endocarditis.  His recent lumbar x-rays show L4 compression but no obvious bony erosion to suggest vertebral infection.  I recommend observation off of antibiotics for now.        Unprioritized   ESRD (end stage renal disease) ([place])    He is very focused on wanting to change to home.  Neither he, Ronald [name]  nor Juanda Crumble know who his nephrologist is.  He is having a great deal of pain and difficulty going back and forth to the dialysis center.  Apparently he has been discussing  options with hospice but has elected to continue dialysis for now.  I suggested that they speak with his nephrologist when he goes for dialysis tomorrow so that they better understand his options.       Other Visit Diagnoses    Pain    -  Primary   Relevant Medications   acetaminophen (TYLENOL) tablet 325 mg (Completed)       Michel Bickers, MD [facility] for [place] [phone] pager   [phone] cell 07/24/2017, 2:09 PM

## 2017-07-24 NOTE — Assessment & Plan Note (Signed)
He is very focused on wanting to change to home.  Neither he, [name] nor Juanda Crumble know who his nephrologist is.  He is having a great deal of pain and difficulty going back and forth to the dialysis center.  Apparently he has been discussing options with hospice but has elected to continue dialysis for now.  I suggested that they speak with his nephrologist when he goes for dialysis tomorrow so that they better understand his options.

## 2017-07-24 NOTE — Assessment & Plan Note (Signed)
I am hopeful that his MSSA bacteremia has now been cured.  He has no evidence of endocarditis.  His recent lumbar x-rays show L4 compression but no obvious bony erosion to suggest vertebral infection.  I recommend observation off of antibiotics for now.

## 2017-07-25 ENCOUNTER — Other Ambulatory Visit: Payer: Self-pay

## 2017-07-25 ENCOUNTER — Encounter: Payer: Self-pay | Admitting: Internal Medicine

## 2017-07-25 DIAGNOSIS — N186 End stage renal disease: Secondary | ICD-10-CM | POA: Diagnosis not present

## 2017-07-25 DIAGNOSIS — E1122 Type 2 diabetes mellitus with diabetic chronic kidney disease: Secondary | ICD-10-CM | POA: Diagnosis not present

## 2017-07-25 DIAGNOSIS — Z992 Dependence on renal dialysis: Secondary | ICD-10-CM | POA: Diagnosis not present

## 2017-07-25 NOTE — Progress Notes (Signed)
07/22/2017  PALLIATIVE CARE CONSULT Ronald [name], Ronald [name]. DOB:  05-Jan-1923. [address] [place] [phone] (previously at [facility]) REFERRING PROVIDER: Dr. [name]. Nephrology patient, Dialysis Mon, Wed, Fri. Refused hospice. Followed by [place] [facility]; Ronald [name] [phone]), [facility] (RN Ronald [name]). Home health discontinued (05/24)as patient's needs are now custodial.  [facility] provides personal care givers Ronald [name] during the day); they take him to dialysis. FAMILY: (dtr) Ronald [name] [phone] *call daughter rather than patient. (dtr) Ronald [name] Sitton [phone]  BILLABLE  ICD-10:  IMPRESSION/RECOMMENDATIONS: 1. Pressure Injuries: - Stage 2 pressure injury; two small spots 0.5 cm round, both to left lower coccyx. They are improving per paid care giver. No signs infection/inflammation. Managed with barrier cream, Vaseline gauze, and tegaderm film topping.  Left heal  unstagable; central dark area size of a nickle. No signs of surrounding infection or inflammation. Right big toe dime sized, dry eschar; again no signs infection/inflammation.   a. Discussed with paid care giver Ronald [name] repositioning patient side/back/side q2h while awake. Can use barrier cream to all areas and leave open to air.  2. Ongoing tobacco use while bedbound. Discussed danger of smoking setting of prn oxygen use, with paid care giver and with patient. Ronald [name] does have 24/7 coverage,  and close supervision. Despite this there are multi burn spots on his covers, and cigarette ashes in the bed. Emphasized taking off nasal cannula and turning off O2 compressor when smoking. All verbalized understanding.   3. ESRD; dialysis 3x/wk. Initiated Feb of this year. -patient reiterated desire to continue with dialysis treatments.  At one point he was hoping to initiate home dialysis treatments, but his daughters feel that this might be too difficult to manage.  4. Bed  bound due to weakness/debilitation from co-morbid illnesses:  -One month ago earlier able to stand and partially weight bear. Now bed bound/unable to assist with turning side to side in bed.  Total assist transfer to wheelchair (when taken to dialysis appointments). Too fatigued to participate in home PT program.  5. Transient back pain when turned side to side  and with transfer to wheel chair (L4 compression fracture), managed with prn Tylenol.  6. Advanced Care Planning:  -DNR/MOST discussed; patient desires full scope of resuscitation.  -Most form reviewed with daughter Ronald [name]; Risk analyst provided. Patient has two daughters who would favor comfort care, should the decision fall to them.   7. Follow up: -Daughter Ronald [name] requested f/u by [facility] on a monthly basis.   I spent 75 minutes providing this consultation (from  10am to 11:15am). More than 50% of that time was spent coordinating communication.  HPI: Ronald [name] is a 84 yo male with hx of ESRD (dialysis initiated Feb this year), chronic CHF, atrial fibrillation (not on anticoagulation therapy), and aortic stenosis. Referred for  Palliative Care, for assistance with symptom management, help with coordination of resources, and ongoing discussions regarding goals of care/advanced directives  CODE STATUS: full code PPS: 30%.  HOSPICE ELIGIBILITY: Yes, when patient agreeable; if discontinuation of dialysis  MEDICATIONS :  acetaminophen 650mg  q6h prn, albuterol neb q6h prn, bupropion 150mg  qd, cardura 2mg  qhs, FeSO4 325 bid, proscar 5mg  qhs, Flonase 62mcg each nare bid, Glucotrol 5mg  qd, Claritin 10mg  qd, melatonin 5mg  qhs, toprol XL 25mg   tab bid, MTV, O2 @ 2L, protonix 40mg qd, zocor 20mg  qhs, ultram 50mg , Anoro Ellipta qd. ALLERGIES: Aspirin, tape  PAST MEDICAL HISTORY: ESRD (dialysis; L UE graft Feb 2019),  aortic Stenosis (bovine valve 2012), bladder cancer, LLE cellulitis, chronic a-fib, chronic CHF,  peripheral vascular disease/claudication (LE stents), COPD, depression, gastric ulcers, HTN, DM type II/neuroathy, pneumonia, sinusitis, thrombocytopenia (r/t drugs), severe protein calorie malnutrition, sepsis (MSSA March 2019), lower GIB, R hip ORIF, restless leg syndrome, depression, arthritis  07/14/2017: ER fecal disimpaction 07/06/2017: [facility] 03/26-04/02/2017:Sepsis/MRSSA bacteremia. [facility] rehab; home 06/10/2017 w/24/7 assist 02/14-03/03/2017: Hosp adm CHF/initiated hemodialysis. CT : slightly enlarged pancreatic mass   PHYSICAL EXAM: Thin, frail appearing elderly male lying supine in bed. HEENT: [place], AT LUNGS:  bilaterally CTA CARDIAC: irregular rate/rhythm ABD: soft, non distended, NABS EXTREMITIES: no pedal edema MUSCULOSKELETAL: no contractures SKIN: left upper arm graft NEURO:  easily falls asleep during exam; easily awakens. A & O X 3.  Violeta Gelinas NP-C

## 2017-07-25 NOTE — Patient Outreach (Signed)
[facility]) Care Management   07/25/2017  Ronald [name] 10/07/1922 [id number]  Ronald [name] is an 84 y.o. male  Subjective: Client focused on eating breakfast at this time.  Objective: BP (!) 110/50   Pulse 88   Resp 20   SpO2 96%   Review of Systems  Respiratory:       Lungs clear anteriorly  Cardiovascular:       Heart rate regular  Genitourinary:       Hemodialysis/incontinent  Skin:       Scratches to front of legs bilaterally, more on right leg. Dressings to upper arms bilaterally. Foam dressing intact to sacral area.    Physical Exam skin warm dry, color within normal limits.  Encounter Medications:   Outpatient Encounter Medications as of 07/25/2017  Medication Sig  . acetaminophen (TYLENOL) 325 MG tablet Take 650 mg by mouth every 6 (six) hours as needed ("for pain score 1-4; not to exceed #8 tablets in 24 hours").   Marland Kitchen albuterol (ACCUNEB) 0.63 MG/3ML nebulizer solution Take 3 mLs by nebulization every 6 (six) hours as needed for wheezing or shortness of breath.  Marland Kitchen buPROPion (WELLBUTRIN XL) 150 MG 24 hr tablet Take 150 mg by mouth daily.   . diphenhydrAMINE (BENADRYL) 12.5 MG/5ML elixir Take 5 mLs (12.5 mg total) by mouth every 6 (six) hours as needed for itching. Over the counter (Patient taking differently: Take 12.5 mg by mouth every 6 (six) hours as needed for itching. )  . doxazosin (CARDURA) 2 MG tablet Take 1 tablet (2 mg total) by mouth at bedtime.  . ferrous sulfate 325 (65 FE) MG EC tablet Take 1 tablet (325 mg total) by mouth 2 (two) times daily. (Patient taking differently: Take 325 mg by mouth 2 (two) times daily with a meal. )  . finasteride (PROSCAR) 5 MG tablet Take 5 mg by mouth at bedtime.   . fluticasone (FLONASE) 50 MCG/ACT nasal spray Place 1 spray into both nostrils 2 (two) times daily.   Marland Kitchen glipiZIDE (GLUCOTROL) 5 MG tablet Take 5 mg by mouth daily before breakfast.  . loratadine (CLARITIN) 10 MG tablet Take 10 mg by mouth  daily.  . Melatonin 5 MG TABS Take 5 mg by mouth at bedtime.  . metoprolol succinate (TOPROL-XL) 25 MG 24 hr tablet Take 0.5 tablets (12.5 mg total) by mouth 2 (two) times daily with a meal.  . Multiple Vitamins-Minerals (MULTIVITAMIN WITH MINERALS) tablet Take 1 tablet by mouth at bedtime.  Marland Kitchen neomycin-bacitracin-polymyxin (NEOSPORIN) ointment Apply 1 application topically daily. Apply to left elbow and forearm daily for 10 days. Started 05/14/17.  Marland Kitchen OXYGEN Inhale 2 L into the lungs continuous.  . pantoprazole (PROTONIX) 40 MG tablet Take 1 tablet (40 mg total) by mouth daily.  . simvastatin (ZOCOR) 20 MG tablet Take 20 mg by mouth every evening.  . traMADol (ULTRAM) 50 MG tablet Take 1 tablet (50 mg total) by mouth every 12 (twelve) hours as needed for moderate pain.  Marland Kitchen Umeclidinium-Vilanterol (ANORO ELLIPTA) 62.5-25 MCG/INH AEPB Inhale 1 puff into the lungs daily.   No facility-administered encounter medications on file as of 07/25/2017.     Functional Status:   In your present state of health, do you have any difficulty performing the following activities: 05/20/2017 05/06/2017  Hearing? [name]  Vision? N N  Difficulty concentrating or making decisions? N N  Comment - -  Walking or climbing stairs? Y N  Dressing or bathing? Aggie Moats  Doing errands, shopping? Y Y  Comment - children help  Some recent data might be hidden    Fall/Depression Screening:    Fall Risk  05/05/2017 11/30/2014 11/30/2014  Falls in the past year? Yes Yes Yes  Number falls in past yr: 2 or more - 2 or more  Injury with Fall? Yes Yes Yes  Comment - - fracture right wrist  Risk Factor Category  High Fall Risk High Fall Risk High Fall Risk  Risk for fall due to : History of fall(s) History of fall(s);Impaired mobility History of fall(s);Impaired mobility  Follow up - Education provided;Falls prevention discussed Education provided;Falls prevention discussed   PHQ 2/9 Scores 05/05/2017 11/30/2014  PHQ - 2 Score 3 0  PHQ- 9  Score 8 -    Assessment: 84 year old admitted 2/14-3/2 with heart failure; new hemodialysis patient. History of heart failure, COPD,atrial fibrillation; diabetes; lower Gastro-Intestinal bleed.  Client admitted 05/20/17-05/26/17 with Sepsis/Bacteremia.[name]. Client discharged back to his home with 24/7 assistance on 06/10/17.  RNCM completed home visit. Present at client's home was daughter, [name] and client's personal care provider(Charles).   Palliative care with Hospice and palliative care of [place] involved. Mrs.Sitton reports palliative care nurse visit was a good. She states that the nurse helped clarify some things. [name] states that client declines Hospice care because he wants to continue dialysis treatments.   Client was sitting up in bed eating breakfast. He was agreeable to [facility] completing vital signs. VS 110/58 HR 88, respirations 20, oxygen saturation 96%.   RNCM discussed pain management. [name] reports client is taking his medications for pain. Per personal care assistant, client is not in pain all the time and has periods of being comfortable. He states client is primarily uncomfortable with movement. RNCM reinforced if client's pain not controlled to follow up with primary care.  Daughter expressed that home health nurse was approved for more visits. She states there is a list of supplies that she needed to obtain per home health nurse. RNCM discussed supplies available at Care Management office and cost associated.   [name] to pick up the following supplies from [place] Management office:  2.3" non adherent pads telfa box of 100  Kerlex 2"  kerlex 4"  NS 500 ml  Extra protective barrier cream.  Incontinent care supply.  Plan:  Follow up telephonically next week.  [name], RN, MSN, [place] Coordinator Cell: [phone]

## 2017-07-26 DIAGNOSIS — N186 End stage renal disease: Secondary | ICD-10-CM | POA: Diagnosis not present

## 2017-07-26 DIAGNOSIS — E1122 Type 2 diabetes mellitus with diabetic chronic kidney disease: Secondary | ICD-10-CM | POA: Diagnosis not present

## 2017-07-26 DIAGNOSIS — Z992 Dependence on renal dialysis: Secondary | ICD-10-CM | POA: Diagnosis not present

## 2017-07-28 ENCOUNTER — Other Ambulatory Visit: Payer: Self-pay

## 2017-07-28 DIAGNOSIS — D649 Anemia, unspecified: Secondary | ICD-10-CM | POA: Diagnosis not present

## 2017-07-28 DIAGNOSIS — N186 End stage renal disease: Secondary | ICD-10-CM | POA: Diagnosis not present

## 2017-07-28 DIAGNOSIS — D689 Coagulation defect, unspecified: Secondary | ICD-10-CM | POA: Diagnosis not present

## 2017-07-28 DIAGNOSIS — Z23 Encounter for immunization: Secondary | ICD-10-CM | POA: Diagnosis not present

## 2017-07-28 NOTE — Patient Outreach (Signed)
[facility]) Care Management  07/28/2017  Late Entry for 07/25/17 [place] 02-Aug-1922 [id number]   Care Coordination: RNCM spoke with Home health nurse Margaretha Sheffield McSweeney-Discussed home visit completed with client and daughter. Discussed the supplies that client's daughter was picking up from [place] Management office.  Per Ms. McSweeney-Payor source has authorized only two visits and she will complete her second visit to teach family how to complete dressing changes. RNCM encouraged Ms. McSweeney to reinforce that the person present during the visit will be willing to do the dressing changes when private pay-personal care giver is not available to complete the dressing change.  Plan: RNCM will continue to follow.  [name], RN, MSN, [place] Coordinator Cell: [phone]

## 2017-07-30 DIAGNOSIS — I4891 Unspecified atrial fibrillation: Secondary | ICD-10-CM | POA: Diagnosis not present

## 2017-07-30 DIAGNOSIS — S52591A Other fractures of lower end of right radius, initial encounter for closed fracture: Secondary | ICD-10-CM | POA: Diagnosis not present

## 2017-07-30 DIAGNOSIS — J449 Chronic obstructive pulmonary disease, unspecified: Secondary | ICD-10-CM | POA: Diagnosis not present

## 2017-07-30 DIAGNOSIS — S42122D Displaced fracture of acromial process, left shoulder, subsequent encounter for fracture with routine healing: Secondary | ICD-10-CM | POA: Diagnosis not present

## 2017-07-30 DIAGNOSIS — R269 Unspecified abnormalities of gait and mobility: Secondary | ICD-10-CM | POA: Diagnosis not present

## 2017-07-30 DIAGNOSIS — I5032 Chronic diastolic (congestive) heart failure: Secondary | ICD-10-CM | POA: Diagnosis not present

## 2017-07-30 DIAGNOSIS — M810 Age-related osteoporosis without current pathological fracture: Secondary | ICD-10-CM | POA: Diagnosis not present

## 2017-07-30 DIAGNOSIS — R0602 Shortness of breath: Secondary | ICD-10-CM | POA: Diagnosis not present

## 2017-07-30 DIAGNOSIS — R296 Repeated falls: Secondary | ICD-10-CM | POA: Diagnosis not present

## 2017-07-30 DIAGNOSIS — M84359S Stress fracture, hip, unspecified, sequela: Secondary | ICD-10-CM | POA: Diagnosis not present

## 2017-08-04 ENCOUNTER — Other Ambulatory Visit: Payer: Self-pay

## 2017-08-04 NOTE — Patient Outreach (Signed)
[facility]) Care Management  08/04/2017  Ronald [name] [facility] October 13, 1922 [id number]  RNCM called to follow up. Spoke with daughter, Ronald [name] who reports that the last day client went to dialysis was on Monday June 3rd. She states that Hospice is now involved.  RNCM offered support and encouraged them to call Hospice with any questions or concerns any time of the day. Reinforced they are there for support.  She reports he is not eating much and hard to arouse. She states he continues to have around the clock personal care services.  RNCM discussed case closure as Hospice is now managing client's care management needs. [name] verbalized understanding.   Plan: close case.  [name], RN, MSN, [place] Coordinator Cell: [phone]

## 2017-08-25 DEATH — deceased

## 2018-01-07 ENCOUNTER — Telehealth: Payer: Self-pay | Admitting: Internal Medicine

## 2018-05-12 NOTE — Telephone Encounter (Signed)
No encounter. Violeta Gelinas NP-C

## 2018-07-04 IMAGING — DX DG CHEST 1V PORT
1 series · 1 of 1 positions shown · non-contrast
Comparison: Portable chest x-ray April 19, 2017

CLINICAL DATA: Suspicion of sepsis.

EXAM:
PORTABLE CHEST 1 VIEW

[chest ap]
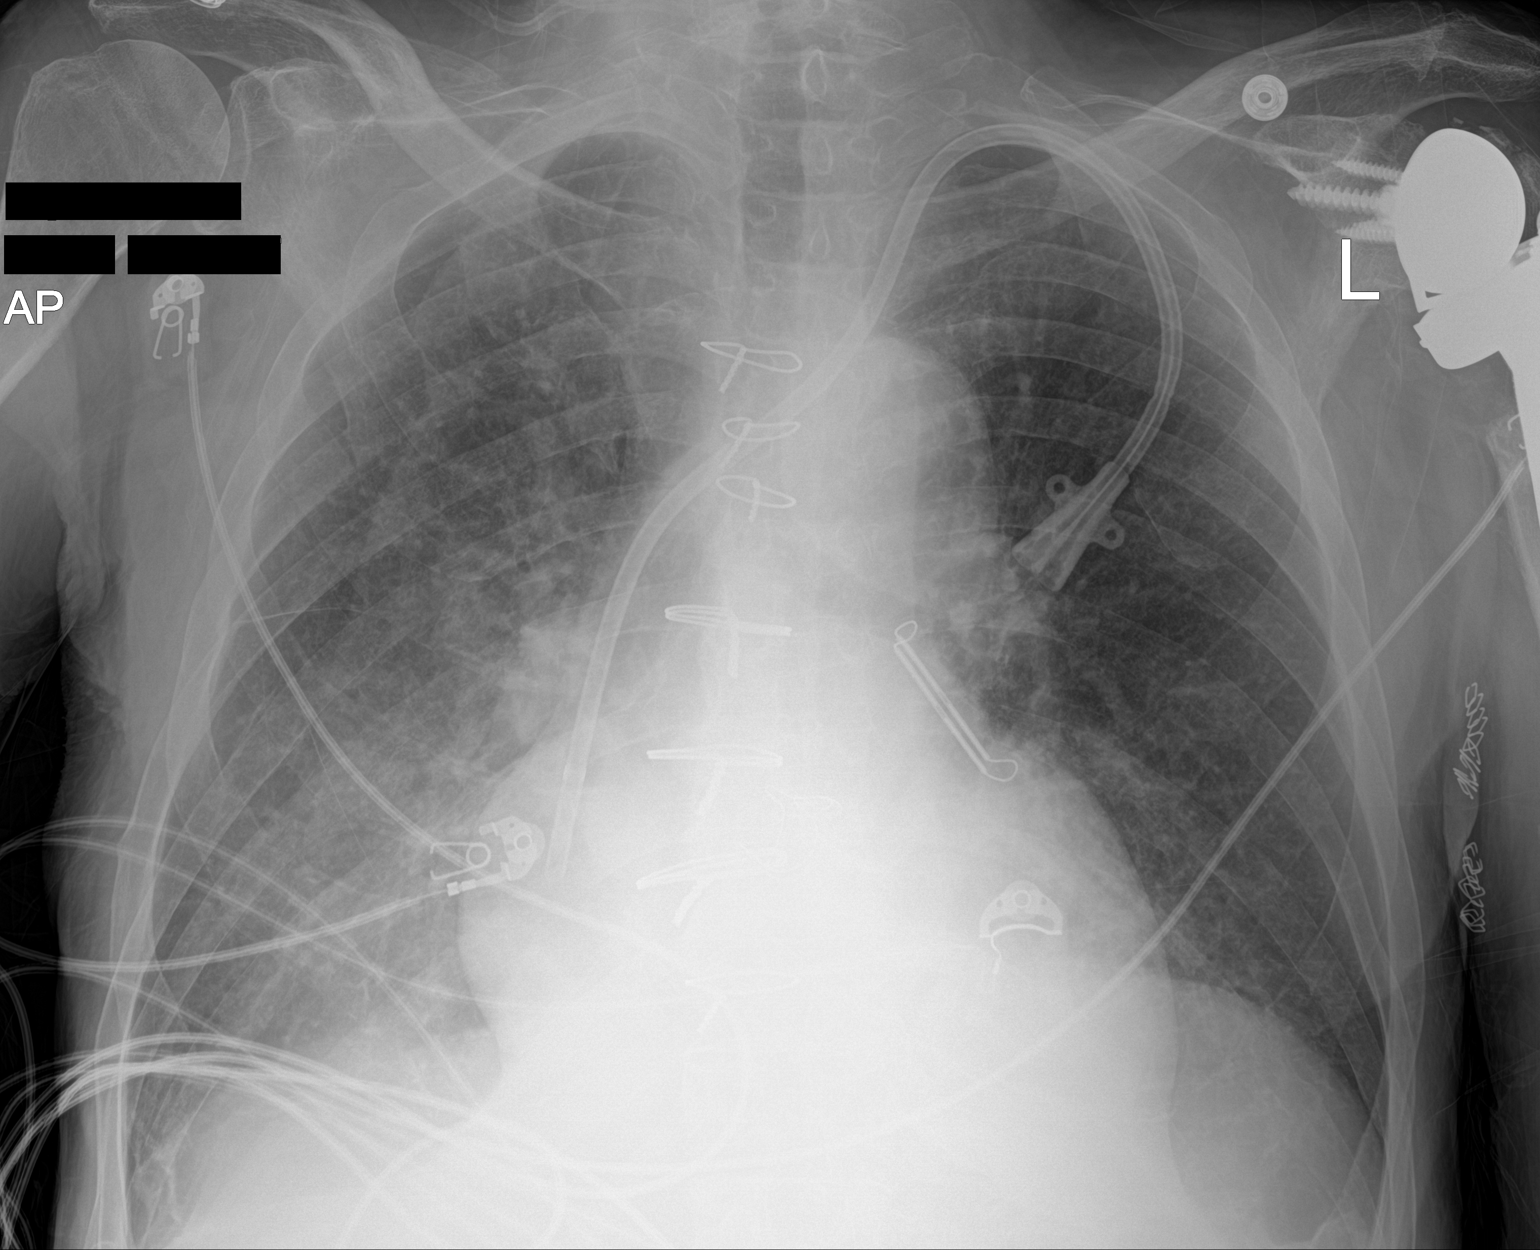

[1 of 1 positions shown; findings below may reference images not displayed]

FINDINGS: The lungs are well-expanded. The interstitial markings remain
increased bilaterally greatest on the right. The cardiac silhouette
remains mildly enlarged. The pulmonary vascularity is engorged. The
dual-lumen dialysis catheter tip projects at the cavoatrial
junction. The sternal wires and left atrial appendage clip are
stable.
IMPRESSION: CHF with mild interstitial edema. One cannot exclude basilar
pneumonia on the right.
